# Patient Record
Sex: Male | Born: 1950 | Race: White | Hispanic: No | Marital: Married | Smoking: Never smoker
Health system: Southern US, Community
[De-identification: ages and names within clinical notes are randomized; demographics above are authoritative.]

## PROBLEM LIST (undated history)

## (undated) DIAGNOSIS — E039 Hypothyroidism, unspecified: Secondary | ICD-10-CM

## (undated) DIAGNOSIS — E119 Type 2 diabetes mellitus without complications: Secondary | ICD-10-CM

## (undated) DIAGNOSIS — I872 Venous insufficiency (chronic) (peripheral): Secondary | ICD-10-CM

## (undated) DIAGNOSIS — E7800 Pure hypercholesterolemia, unspecified: Secondary | ICD-10-CM

## (undated) DIAGNOSIS — M4800 Spinal stenosis, site unspecified: Secondary | ICD-10-CM

## (undated) DIAGNOSIS — Z974 Presence of external hearing-aid: Secondary | ICD-10-CM

## (undated) DIAGNOSIS — N183 Chronic kidney disease, stage 3 unspecified: Secondary | ICD-10-CM

## (undated) DIAGNOSIS — D649 Anemia, unspecified: Secondary | ICD-10-CM

## (undated) DIAGNOSIS — G7000 Myasthenia gravis without (acute) exacerbation: Secondary | ICD-10-CM

## (undated) DIAGNOSIS — E538 Deficiency of other specified B group vitamins: Secondary | ICD-10-CM

## (undated) DIAGNOSIS — R29898 Other symptoms and signs involving the musculoskeletal system: Secondary | ICD-10-CM

## (undated) DIAGNOSIS — G959 Disease of spinal cord, unspecified: Secondary | ICD-10-CM

## (undated) DIAGNOSIS — K219 Gastro-esophageal reflux disease without esophagitis: Secondary | ICD-10-CM

## (undated) DIAGNOSIS — N200 Calculus of kidney: Secondary | ICD-10-CM

## (undated) HISTORY — PX: COLONOSCOPY: SHX174

## (undated) HISTORY — PX: TONSILLECTOMY: SUR1361

## (undated) HISTORY — PX: CATARACT EXTRACTION W/ INTRAOCULAR LENS IMPLANT: SHX1309

## (undated) HISTORY — PX: SHOULDER SURGERY: SHX246

## (undated) HISTORY — PX: CARDIAC CATHETERIZATION: SHX172

---

## 2004-08-09 ENCOUNTER — Ambulatory Visit: Payer: Self-pay

## 2005-10-03 ENCOUNTER — Ambulatory Visit: Payer: Self-pay | Admitting: Unknown Physician Specialty

## 2006-04-24 ENCOUNTER — Ambulatory Visit: Payer: Self-pay | Admitting: Unknown Physician Specialty

## 2008-04-28 ENCOUNTER — Ambulatory Visit: Payer: Self-pay | Admitting: Neurology

## 2009-04-12 ENCOUNTER — Ambulatory Visit: Payer: Self-pay | Admitting: Unknown Physician Specialty

## 2010-01-02 ENCOUNTER — Ambulatory Visit: Payer: Self-pay | Admitting: Unknown Physician Specialty

## 2013-03-06 ENCOUNTER — Ambulatory Visit: Payer: Self-pay | Admitting: Physical Medicine and Rehabilitation

## 2013-09-17 ENCOUNTER — Encounter: Payer: Self-pay | Admitting: Neurology

## 2014-08-12 ENCOUNTER — Other Ambulatory Visit: Payer: Self-pay | Admitting: Physical Medicine and Rehabilitation

## 2014-08-12 DIAGNOSIS — M5416 Radiculopathy, lumbar region: Secondary | ICD-10-CM

## 2014-08-19 ENCOUNTER — Ambulatory Visit
Admission: RE | Admit: 2014-08-19 | Discharge: 2014-08-19 | Disposition: A | Discharge status: Home / Self Care | Payer: BLUE CROSS/BLUE SHIELD | Source: Ambulatory Visit | Attending: Physical Medicine and Rehabilitation | Admitting: Physical Medicine and Rehabilitation

## 2014-08-19 DIAGNOSIS — M5126 Other intervertebral disc displacement, lumbar region: Secondary | ICD-10-CM | POA: Diagnosis not present

## 2014-08-19 DIAGNOSIS — M5416 Radiculopathy, lumbar region: Secondary | ICD-10-CM

## 2014-08-19 DIAGNOSIS — M4806 Spinal stenosis, lumbar region: Secondary | ICD-10-CM | POA: Insufficient documentation

## 2014-08-19 DIAGNOSIS — M545 Low back pain: Secondary | ICD-10-CM | POA: Diagnosis present

## 2015-09-16 ENCOUNTER — Other Ambulatory Visit: Payer: Self-pay | Admitting: Infectious Diseases

## 2015-09-16 ENCOUNTER — Ambulatory Visit
Admission: RE | Admit: 2015-09-16 | Discharge: 2015-09-16 | Disposition: A | Discharge status: Home / Self Care | Payer: BLUE CROSS/BLUE SHIELD | Source: Ambulatory Visit | Attending: Infectious Diseases | Admitting: Infectious Diseases

## 2015-09-16 DIAGNOSIS — A439 Nocardiosis, unspecified: Secondary | ICD-10-CM

## 2015-09-16 DIAGNOSIS — G7000 Myasthenia gravis without (acute) exacerbation: Secondary | ICD-10-CM | POA: Insufficient documentation

## 2015-09-16 DIAGNOSIS — I672 Cerebral atherosclerosis: Secondary | ICD-10-CM | POA: Diagnosis not present

## 2015-09-24 HISTORY — PX: BACK SURGERY: SHX140

## 2015-12-14 ENCOUNTER — Encounter: Payer: Self-pay | Admitting: *Deleted

## 2015-12-19 NOTE — Discharge Instructions (Signed)

## 2015-12-21 ENCOUNTER — Ambulatory Visit: Payer: BLUE CROSS/BLUE SHIELD | Admitting: Anesthesiology

## 2015-12-21 ENCOUNTER — Ambulatory Visit
Admission: RE | Admit: 2015-12-21 | Discharge: 2015-12-21 | Disposition: A | Discharge status: Home / Self Care | Payer: BLUE CROSS/BLUE SHIELD | Source: Ambulatory Visit | Attending: Ophthalmology | Admitting: Ophthalmology

## 2015-12-21 ENCOUNTER — Encounter
Admission: RE | Disposition: A | Discharge status: Home / Self Care | Payer: Self-pay | Source: Ambulatory Visit | Attending: Ophthalmology

## 2015-12-21 DIAGNOSIS — Z9889 Other specified postprocedural states: Secondary | ICD-10-CM | POA: Diagnosis not present

## 2015-12-21 DIAGNOSIS — E1144 Type 2 diabetes mellitus with diabetic amyotrophy: Secondary | ICD-10-CM | POA: Insufficient documentation

## 2015-12-21 DIAGNOSIS — Z87442 Personal history of urinary calculi: Secondary | ICD-10-CM | POA: Insufficient documentation

## 2015-12-21 DIAGNOSIS — E7800 Pure hypercholesterolemia, unspecified: Secondary | ICD-10-CM | POA: Diagnosis not present

## 2015-12-21 DIAGNOSIS — D649 Anemia, unspecified: Secondary | ICD-10-CM | POA: Insufficient documentation

## 2015-12-21 DIAGNOSIS — Z880 Allergy status to penicillin: Secondary | ICD-10-CM | POA: Diagnosis not present

## 2015-12-21 DIAGNOSIS — Z7982 Long term (current) use of aspirin: Secondary | ICD-10-CM | POA: Diagnosis not present

## 2015-12-21 DIAGNOSIS — H2511 Age-related nuclear cataract, right eye: Secondary | ICD-10-CM | POA: Insufficient documentation

## 2015-12-21 DIAGNOSIS — Z794 Long term (current) use of insulin: Secondary | ICD-10-CM | POA: Insufficient documentation

## 2015-12-21 DIAGNOSIS — Z79899 Other long term (current) drug therapy: Secondary | ICD-10-CM | POA: Diagnosis not present

## 2015-12-21 DIAGNOSIS — H9193 Unspecified hearing loss, bilateral: Secondary | ICD-10-CM | POA: Diagnosis not present

## 2015-12-21 DIAGNOSIS — G7000 Myasthenia gravis without (acute) exacerbation: Secondary | ICD-10-CM | POA: Insufficient documentation

## 2015-12-21 HISTORY — DX: Calculus of kidney: N20.0

## 2015-12-21 HISTORY — DX: Anemia, unspecified: D64.9

## 2015-12-21 HISTORY — PX: CATARACT EXTRACTION W/PHACO: SHX586

## 2015-12-21 HISTORY — DX: Pure hypercholesterolemia, unspecified: E78.00

## 2015-12-21 HISTORY — DX: Hypothyroidism, unspecified: E03.9

## 2015-12-21 HISTORY — DX: Myasthenia gravis without (acute) exacerbation: G70.00

## 2015-12-21 HISTORY — DX: Other symptoms and signs involving the musculoskeletal system: R29.898

## 2015-12-21 HISTORY — DX: Presence of external hearing-aid: Z97.4

## 2015-12-21 HISTORY — DX: Type 2 diabetes mellitus without complications: E11.9

## 2015-12-21 LAB — GLUCOSE, CAPILLARY
GLUCOSE-CAPILLARY: 96 mg/dL (ref 65–99)
GLUCOSE-CAPILLARY: 93 mg/dL (ref 65–99)

## 2015-12-21 SURGERY — PHACOEMULSIFICATION, CATARACT, WITH IOL INSERTION
Anesthesia: Monitor Anesthesia Care | Laterality: Right | Wound class: Clean

## 2015-12-21 MED ORDER — MIDAZOLAM HCL 2 MG/2ML IJ SOLN
INTRAMUSCULAR | Status: DC | PRN
  Administered 2015-12-21: 2 mg via INTRAVENOUS

## 2015-12-21 MED ORDER — BSS IO SOLN
INTRAOCULAR | Status: DC | PRN
  Administered 2015-12-21: 54 mL via OPHTHALMIC

## 2015-12-21 MED ORDER — NA HYALUR & NA CHOND-NA HYALUR 0.4-0.35 ML IO KIT
PACK | INTRAOCULAR | Status: DC | PRN
  Administered 2015-12-21: 1 mL via INTRAOCULAR

## 2015-12-21 MED ORDER — MOXIFLOXACIN HCL 0.5 % OP SOLN
1.0000 [drp] | OPHTHALMIC | Status: DC | PRN
  Administered 2015-12-21 (×3): 1 [drp] via OPHTHALMIC

## 2015-12-21 MED ORDER — ARMC OPHTHALMIC DILATING DROPS
1.0000 "application " | OPHTHALMIC | Status: DC | PRN
  Administered 2015-12-21 (×3): 1 via OPHTHALMIC

## 2015-12-21 MED ORDER — OXYCODONE HCL 5 MG/5ML PO SOLN: 5.0000 mg | Freq: Once | ORAL | Status: DC | PRN

## 2015-12-21 MED ORDER — LACTATED RINGERS IV SOLN: INTRAVENOUS | Status: DC

## 2015-12-21 MED ORDER — LIDOCAINE HCL (PF) 4 % IJ SOLN
INTRAOCULAR | Status: DC | PRN
  Administered 2015-12-21: 1 mL via OPHTHALMIC

## 2015-12-21 MED ORDER — CEFUROXIME OPHTHALMIC INJECTION 1 MG/0.1 ML
INJECTION | OPHTHALMIC | Status: DC | PRN
  Administered 2015-12-21: 0.1 mL via OPHTHALMIC

## 2015-12-21 MED ORDER — TIMOLOL MALEATE 0.5 % OP SOLN
OPHTHALMIC | Status: DC | PRN
Start: 2015-12-21 — End: 2015-12-21
  Administered 2015-12-21: 1 [drp] via OPHTHALMIC

## 2015-12-21 MED ORDER — ARMC OPHTHALMIC DILATING DROPS: 1.0000 "application " | OPHTHALMIC | Status: DC | PRN

## 2015-12-21 MED ORDER — BRIMONIDINE TARTRATE 0.2 % OP SOLN
OPHTHALMIC | Status: DC | PRN
  Administered 2015-12-21: 1 [drp] via OPHTHALMIC

## 2015-12-21 MED ORDER — OXYCODONE HCL 5 MG PO TABS: 5.0000 mg | ORAL_TABLET | Freq: Once | ORAL | Status: DC | PRN

## 2015-12-21 SURGICAL SUPPLY — 25 items
CANNULA ANT/CHMB 27GA (MISCELLANEOUS) ×2 IMPLANT
CARTRIDGE ABBOTT (MISCELLANEOUS) IMPLANT
GLOVE SURG LX 7.5 STRW (GLOVE) ×1
GLOVE SURG LX STRL 7.5 STRW (GLOVE) ×1 IMPLANT
GLOVE SURG TRIUMPH 8.0 PF LTX (GLOVE) ×2 IMPLANT
GOWN STRL REUS W/ TWL LRG LVL3 (GOWN DISPOSABLE) ×2 IMPLANT
GOWN STRL REUS W/TWL LRG LVL3 (GOWN DISPOSABLE) ×2
LENS IOL ACRYSOF IQ 20.5 (Intraocular Lens) ×2 IMPLANT
MARKER SKIN DUAL TIP RULER LAB (MISCELLANEOUS) ×2 IMPLANT
NDL RETROBULBAR .5 NSTRL (NEEDLE) IMPLANT
NEEDLE FILTER BLUNT 18X 1/2SAF (NEEDLE) ×1
NEEDLE FILTER BLUNT 18X1 1/2 (NEEDLE) ×1 IMPLANT
PACK CATARACT BRASINGTON (MISCELLANEOUS) ×2 IMPLANT
PACK EYE AFTER SURG (MISCELLANEOUS) ×2 IMPLANT
PACK OPTHALMIC (MISCELLANEOUS) ×2 IMPLANT
RING MALYGIN 7.0 (MISCELLANEOUS) IMPLANT
SUT ETHILON 10-0 CS-B-6CS-B-6 (SUTURE)
SUT VICRYL  9 0 (SUTURE)
SUT VICRYL 9 0 (SUTURE) IMPLANT
SUTURE EHLN 10-0 CS-B-6CS-B-6 (SUTURE) IMPLANT
SYR 3ML LL SCALE MARK (SYRINGE) ×2 IMPLANT
SYR 5ML LL (SYRINGE) ×2 IMPLANT
SYR TB 1ML LUER SLIP (SYRINGE) ×2 IMPLANT
WATER STERILE IRR 250ML POUR (IV SOLUTION) ×2 IMPLANT
WIPE NON LINTING 3.25X3.25 (MISCELLANEOUS) ×2 IMPLANT

## 2015-12-21 NOTE — H&P (Signed)
The History and Physical notes are on paper, have been signed, and are to be scanned. The patient remains stable and unchanged from the H&P.   Previous H&P reviewed, patient examined, and there are no changes.  [name] 12/21/2015 7:36 AM   

## 2015-12-21 NOTE — Transfer of Care (Signed)
Immediate Anesthesia Transfer of Care Note  Patient: Douglas KnackRichard L Sevigny Jr.  Procedure(s) Performed: Procedure(s) with comments: CATARACT EXTRACTION PHACO AND INTRAOCULAR LENS PLACEMENT (IOC) (Right) - DIABETIC - insulin and oral meds  Patient Location: PACU  Anesthesia Type: MAC  Level of Consciousness: awake, alert  and patient cooperative  Airway and Oxygen Therapy: Patient Spontanous Breathing and Patient connected to supplemental oxygen  Post-op Assessment: Post-op Vital signs reviewed, Patient's Cardiovascular Status Stable, Respiratory Function Stable, Patent Airway and No signs of Nausea or vomiting  Post-op Vital Signs: Reviewed and stable  Complications: No apparent anesthesia complications

## 2015-12-21 NOTE — Anesthesia Procedure Notes (Signed)
Procedure Name: MAC Performed by: Dyani Babel Pre-anesthesia Checklist: Patient identified, Emergency Drugs available, Suction available, Timeout performed and Patient being monitored Patient Re-evaluated:Patient Re-evaluated prior to inductionOxygen Delivery Method: Nasal cannula Placement Confirmation: positive ETCO2     

## 2015-12-21 NOTE — Anesthesia Preprocedure Evaluation (Signed)
Anesthesia Evaluation  Patient identified by MRN, date of birth, ID band Patient awake    Reviewed: Allergy & Precautions, H&P , NPO status , Patient's Chart, lab work & pertinent test results  History of Anesthesia Complications Negative for: history of anesthetic complications  Airway Mallampati: I  TM Distance: >3 FB Neck ROM: full    Dental no notable dental hx.    Pulmonary neg pulmonary ROS,    Pulmonary exam normal        Cardiovascular negative cardio ROS Normal cardiovascular exam     Neuro/Psych Myasthenia- currently stable.  Neuromuscular disease negative psych ROS   GI/Hepatic negative GI ROS, Neg liver ROS,   Endo/Other  diabetes, Well Controlled, Type 2Hypothyroidism   Renal/GU negative Renal ROS     Musculoskeletal   Abdominal   Peds  Hematology   Anesthesia Other Findings   Reproductive/Obstetrics                             Anesthesia Physical Anesthesia Plan  ASA: II  Anesthesia Plan: MAC   Post-op Pain Management:    Induction:   Airway Management Planned:   Additional Equipment:   Intra-op Plan:   Post-operative Plan:   Informed Consent:   Plan Discussed with:   Anesthesia Plan Comments:         Anesthesia Quick Evaluation

## 2015-12-21 NOTE — Op Note (Signed)
LOCATION:  [facility]   PREOPERATIVE DIAGNOSIS:    Nuclear sclerotic cataract right eye. H25.11   POSTOPERATIVE DIAGNOSIS:  Nuclear sclerotic cataract right eye.     PROCEDURE:  Phacoemusification with posterior chamber intraocular lens placement of the right eye   LENS:   Implant Name Type Inv. Item Serial No. Manufacturer Lot No. LRB No. Used  LENS IOL ACRYSOF IQ 20.5 - [id number] Intraocular Lens LENS IOL ACRYSOF IQ 20.5 [id number] ALCON   Right 1        ULTRASOUND TIME: 15 % of 0 minutes, 58 seconds.  CDE 8.8   SURGEON:  Deirdre Evenerhadwick R. Bensen [place]adderdon, MD   ANESTHESIA:  Topical with tetracaine drops and 2% Xylocaine jelly, augmented with 1% preservative-free intracameral lidocaine.    COMPLICATIONS:  None.   DESCRIPTION OF PROCEDURE:  The patient was identified in the holding room and transported to the operating room and placed in the supine position under the operating microscope.  The right eye was identified as the operative eye and it was prepped and draped in the usual sterile ophthalmic fashion.   A 1 millimeter clear-corneal paracentesis was made at the 12:00 position.  0.5 ml of preservative-free 1% lidocaine was injected into the anterior chamber. The anterior chamber was filled with Viscoat viscoelastic.  A 2.4 millimeter keratome was used to make a near-clear corneal incision at the 9:00 position.  A curvilinear capsulorrhexis was made with a cystotome and capsulorrhexis forceps.  Balanced salt solution was used to hydrodissect and hydrodelineate the nucleus.   Phacoemulsification was then used in stop and chop fashion to remove the lens nucleus and epinucleus.  The remaining cortex was then removed using the irrigation and aspiration handpiece. Provisc was then placed into the capsular bag to distend it for lens placement.  A lens was then injected into the capsular bag.  The remaining viscoelastic was aspirated.   Wounds were hydrated with balanced salt solution.   The anterior chamber was inflated to a physiologic pressure with balanced salt solution.  No wound leaks were noted. Cefuroxime 0.1 ml of a 10mg /ml solution was injected into the anterior chamber for a dose of 1 mg of intracameral antibiotic at the completion of the case.   Timolol and Brimonidine drops were applied to the eye.  The patient was taken to the recovery room in stable condition without complications of anesthesia or surgery.   Meredith Kilbride 12/21/2015, 8:00 AM

## 2015-12-21 NOTE — Anesthesia Postprocedure Evaluation (Signed)
Anesthesia Post Note  Patient: Douglas KnackRichard L Helmers Jr.  Procedure(s) Performed: Procedure(s) (LRB): CATARACT EXTRACTION PHACO AND INTRAOCULAR LENS PLACEMENT (IOC) (Right)  Patient location during evaluation: PACU Anesthesia Type: MAC Level of consciousness: awake and alert Pain management: pain level controlled Vital Signs Assessment: post-procedure vital signs reviewed and stable Respiratory status: spontaneous breathing Cardiovascular status: stable Postop Assessment: no headache Anesthetic complications: no    Verner Cholunkle, III,  Quy D

## 2015-12-22 ENCOUNTER — Encounter: Payer: Self-pay | Admitting: Ophthalmology

## 2016-01-02 ENCOUNTER — Emergency Department
Admission: EM | Admit: 2016-01-02 | Discharge: 2016-01-02 | Disposition: A | Discharge status: Home / Self Care | Payer: BLUE CROSS/BLUE SHIELD | Attending: Emergency Medicine | Admitting: Emergency Medicine

## 2016-01-02 ENCOUNTER — Encounter: Payer: Self-pay | Admitting: Emergency Medicine

## 2016-01-02 DIAGNOSIS — Z7982 Long term (current) use of aspirin: Secondary | ICD-10-CM | POA: Insufficient documentation

## 2016-01-02 DIAGNOSIS — Z79899 Other long term (current) drug therapy: Secondary | ICD-10-CM | POA: Diagnosis not present

## 2016-01-02 DIAGNOSIS — R350 Frequency of micturition: Secondary | ICD-10-CM | POA: Diagnosis present

## 2016-01-02 DIAGNOSIS — Z791 Long term (current) use of non-steroidal anti-inflammatories (NSAID): Secondary | ICD-10-CM | POA: Diagnosis not present

## 2016-01-02 DIAGNOSIS — E119 Type 2 diabetes mellitus without complications: Secondary | ICD-10-CM | POA: Diagnosis not present

## 2016-01-02 DIAGNOSIS — E039 Hypothyroidism, unspecified: Secondary | ICD-10-CM | POA: Insufficient documentation

## 2016-01-02 DIAGNOSIS — Z794 Long term (current) use of insulin: Secondary | ICD-10-CM | POA: Insufficient documentation

## 2016-01-02 DIAGNOSIS — E875 Hyperkalemia: Secondary | ICD-10-CM | POA: Diagnosis not present

## 2016-01-02 LAB — URINALYSIS COMPLETE WITH MICROSCOPIC (ARMC ONLY)
BACTERIA UA: NONE SEEN
Bilirubin Urine: NEGATIVE
Glucose, UA: >500 mg/dL — AB
Ketones, ur: NEGATIVE mg/dL
Leukocytes, UA: NEGATIVE
NITRITE: NEGATIVE
PROTEIN: 30 mg/dL — AB
SPECIFIC GRAVITY, URINE: 1.017 (ref 1.005–1.030)
pH: 5 (ref 5.0–8.0)

## 2016-01-02 LAB — CBC
HCT: 37.7 % — ABNORMAL LOW (ref 40.0–52.0)
Hemoglobin: 12.6 g/dL — ABNORMAL LOW (ref 13.0–18.0)
MCH: 30.2 pg (ref 26.0–34.0)
MCHC: 33.4 g/dL (ref 32.0–36.0)
MCV: 90.4 fL (ref 80.0–100.0)
Platelets: 287 10*3/uL (ref 150–440)
RBC: 4.17 MIL/uL — ABNORMAL LOW (ref 4.40–5.90)
RDW: 14.3 % (ref 11.5–14.5)
WBC: 8.4 10*3/uL (ref 3.8–10.6)

## 2016-01-02 LAB — BASIC METABOLIC PANEL
Anion gap: 8 (ref 5–15)
BUN: 34 mg/dL — AB (ref 6–20)
CALCIUM: 9.1 mg/dL (ref 8.9–10.3)
CO2: 18 mmol/L — ABNORMAL LOW (ref 22–32)
CREATININE: 1.46 mg/dL — AB (ref 0.61–1.24)
Chloride: 108 mmol/L (ref 101–111)
GFR calc Af Amer: 58 mL/min — ABNORMAL LOW (ref >60)
GFR, EST NON AFRICAN AMERICAN: 50 mL/min — AB (ref >60)
GLUCOSE: 298 mg/dL — AB (ref 65–99)
POTASSIUM: 5.5 mmol/L — AB (ref 3.5–5.1)
SODIUM: 134 mmol/L — AB (ref 135–145)

## 2016-01-02 LAB — GLUCOSE, CAPILLARY: GLUCOSE-CAPILLARY: 316 mg/dL — AB (ref 65–99)

## 2016-01-02 NOTE — ED Triage Notes (Signed)
[facility] with elevated potassium. Pt states he thinks he may be dehydrated. Pt denies NVD. Pt reports increased urination. Pt recently treated for UTI.

## 2016-01-02 NOTE — Discharge Instructions (Signed)
Please seek medical attention for any high fevers, chest pain, shortness of breath, change in behavior, persistent vomiting, bloody stool or any other new or concerning symptoms.  

## 2016-01-02 NOTE — ED Provider Notes (Signed)
[facility] Emergency Department Provider Note    ____________________________________________   I have reviewed the triage vital signs and the nursing notes.   HISTORY  Chief Complaint Abnormal Lab   History limited by: Not Limited   HPI [name]. is a 65 y.o. male who presents to the emergency department today at the request of his primary provider for IV fluids. The patient has been seeing his PCP because of concern for urinary frequency. He was diagnosed with UTI and has been on antibiotics. Today at recheck blood work was checked which showed elevated potassium and creatinine. Given these findings the patient was advised to come tot he emergency department for IV fluids.   Past Medical History:  Diagnosis Date  . Anemia   . Diabetes mellitus without complication ([place])   . Hypercholesteremia   . Hypothyroidism   . Kidney stones   . Leg weakness, bilateral    due to Myasthenia Gravis  . Myasthenia gravis ([place])   . Wears hearing aid    bilateral    There are no active problems to display for this patient.   Past Surgical History:  Procedure Laterality Date  . BACK SURGERY  09/2015  . CARDIAC CATHETERIZATION    . CATARACT EXTRACTION W/ INTRAOCULAR LENS IMPLANT    . CATARACT EXTRACTION W/PHACO Right 12/21/2015   Procedure: CATARACT EXTRACTION PHACO AND INTRAOCULAR LENS PLACEMENT (IOC);  Surgeon: Leandrew Koyanagi, MD;  Location: [place];  Service: Ophthalmology;  Laterality: Right;  DIABETIC - insulin and oral meds  . COLONOSCOPY    . SHOULDER SURGERY     labrum repair  . TONSILLECTOMY      Prior to Admission medications   Medication Sig Start Date End Date Taking? Authorizing Provider  aspirin 81 MG tablet Take 81 mg by mouth daily.    Historical Provider, MD  benzonatate (TESSALON) 200 MG capsule Take 200 mg by mouth 3 (three) times daily as needed for cough.    Historical Provider, MD  Cyanocobalamin 1000  MCG/ML KIT Inject as directed.    Historical Provider, MD  etodolac (LODINE) 500 MG tablet Take 500 mg by mouth daily as needed.    Historical Provider, MD  insulin glargine (LANTUS) 100 UNIT/ML injection Inject 54 Units into the skin daily.    Historical Provider, MD  insulin lispro (HUMALOG) 100 UNIT/ML injection Inject into the skin as needed for high blood sugar (sliding scale).    Historical Provider, MD  levalbuterol Penne Lash) 1.25 MG/3ML nebulizer solution Take 1.25 mg by nebulization every 4 (four) hours as needed for wheezing.    Historical Provider, MD  levothyroxine (SYNTHROID, LEVOTHROID) 150 MCG tablet Take 150 mcg by mouth daily before breakfast.    Historical Provider, MD  metFORMIN (GLUCOPHAGE) 1000 MG tablet Take 1,000 mg by mouth 2 (two) times daily with a meal.    Historical Provider, MD  minocycline (DYNACIN) 100 MG tablet Take 100 mg by mouth 2 (two) times daily.    Historical Provider, MD  mycophenolate (CELLCEPT) 250 MG capsule Take by mouth 2 (two) times daily. 1500 mg AM, 1250 mg PM    Historical Provider, MD  pantoprazole (PROTONIX) 40 MG tablet Take 40 mg by mouth 2 (two) times daily.    Historical Provider, MD  pioglitazone (ACTOS) 45 MG tablet Take 45 mg by mouth daily.    Historical Provider, MD  pravastatin (PRAVACHOL) 80 MG tablet Take 80 mg by mouth daily.    Historical Provider, MD  predniSONE (DELTASONE) 10 MG tablet Take 30 mg by mouth daily with breakfast.    Historical Provider, MD  pyridostigmine (MESTINON) 60 MG tablet Take 60 mg by mouth 3 (three) times daily.    Historical Provider, MD    Allergies Omnicef [cefdinir] and Penicillins  No family history on file.  Social History Social History  Substance Use Topics  . Smoking status: Never Smoker  . Smokeless tobacco: Never Used  . Alcohol use No    Review of Systems  Constitutional: Negative for fever. Cardiovascular: Negative for chest pain. Respiratory: Negative for shortness of  breath. Gastrointestinal: Negative for abdominal pain, vomiting and diarrhea. Genitourinary: Positive for urinary frequency. Neurological: Negative for headaches, focal weakness or numbness.  10-point ROS otherwise negative.  ____________________________________________   PHYSICAL EXAM:  VITAL SIGNS: ED Triage Vitals  Enc Vitals Group     BP 01/02/16 1548 139/80     Pulse Rate 01/02/16 1548 76     Resp 01/02/16 1548 18     Temp 01/02/16 1548 98.3 F (36.8 C)     Temp Source 01/02/16 1548 Oral     SpO2 01/02/16 1548 99 %     Weight 01/02/16 1547 (!) 310 lb (140.6 kg)     Height 01/02/16 1547 6' (1.829 m)     Head Circumference --      Peak Flow --      Pain Score 01/02/16 1547 0   Constitutional: Alert and oriented. Well appearing and in no distress. Eyes: Conjunctivae are normal. Normal extraocular movements. ENT   Head: Normocephalic and atraumatic.   Nose: No congestion/rhinnorhea.   Mouth/Throat: Mucous membranes are moist.   Neck: No stridor. Hematological/Lymphatic/Immunilogical: No cervical lymphadenopathy. Cardiovascular: Normal rate, regular rhythm.  No murmurs, rubs, or gallops. Respiratory: Normal respiratory effort without tachypnea nor retractions. Breath sounds are clear and equal bilaterally. No wheezes/rales/rhonchi. Gastrointestinal: Soft and nontender. No distention.  Genitourinary: Deferred Musculoskeletal: Normal range of motion in all extremities. No lower extremity edema. Neurologic:  Normal speech and language. No gross focal neurologic deficits are appreciated.  Skin:  Skin is warm, dry and intact. No rash noted. Psychiatric: Mood and affect are normal. Speech and behavior are normal. Patient exhibits appropriate insight and judgment.  ____________________________________________    LABS (pertinent positives/negatives) Labs Reviewed  BASIC METABOLIC PANEL - Abnormal; Notable for the following:       Result Value   Sodium 134 (*)     Potassium 5.5 (*)    CO2 18 (*)    Glucose, Bld 298 (*)    BUN 34 (*)    Creatinine, Ser 1.46 (*)    GFR calc non Af Amer 50 (*)    GFR calc Af Amer 58 (*)    All other components within normal limits  CBC - Abnormal; Notable for the following:    RBC 4.17 (*)    Hemoglobin 12.6 (*)    HCT 37.7 (*)    All other components within normal limits  URINALYSIS COMPLETEWITH MICROSCOPIC ([facility] ONLY) - Abnormal; Notable for the following:    Color, Urine YELLOW (*)    APPearance CLEAR (*)    Glucose, UA >500 (*)    Hgb urine dipstick 2+ (*)    Protein, ur 30 (*)    Squamous Epithelial / LPF 0-5 (*)    All other components within normal limits  GLUCOSE, CAPILLARY - Abnormal; Notable for the following:    Glucose-Capillary 316 (*)    All other components within  normal limits  CBG MONITORING, ED     ____________________________________________   EKG  I, Nance Pear, attending physician, personally viewed and interpreted this EKG  EKG Time: 1554 Rate: 76 Rhythm: normal sinus rhythm Axis: normal Intervals: qtc 396 QRS: narrow ST changes: no st elevation Impression: normal ekg   ____________________________________________    RADIOLOGY  None   ____________________________________________   PROCEDURES  Procedures  ____________________________________________   INITIAL IMPRESSION / ASSESSMENT AND PLAN / ED COURSE  Pertinent labs & imaging results that were available during my care of the patient were reviewed by me and considered in my medical decision making (see chart for details).  Potassium has started to improve over what was gotten a clinic today. Will give patient IV fluids here to help with likely small amount of dehydration. Will give patient information on potassium content of foods. Patient states he will follow up in a few days for repeat labs. ____________________________________________   FINAL CLINICAL IMPRESSION(S) / ED DIAGNOSES  Final  diagnoses:  Hyperkalemia     Note: This dictation was prepared with Dragon dictation. Any transcriptional errors that result from this process are unintentional    Nance Pear, MD 01/02/16 2004

## 2016-01-06 ENCOUNTER — Other Ambulatory Visit: Payer: Self-pay | Admitting: Internal Medicine

## 2016-01-06 ENCOUNTER — Ambulatory Visit
Admission: RE | Admit: 2016-01-06 | Discharge: 2016-01-06 | Disposition: A | Discharge status: Home / Self Care | Payer: BLUE CROSS/BLUE SHIELD | Source: Ambulatory Visit | Attending: Internal Medicine | Admitting: Internal Medicine

## 2016-01-06 DIAGNOSIS — R319 Hematuria, unspecified: Secondary | ICD-10-CM

## 2016-01-06 DIAGNOSIS — N3091 Cystitis, unspecified with hematuria: Secondary | ICD-10-CM | POA: Insufficient documentation

## 2016-01-06 DIAGNOSIS — N200 Calculus of kidney: Secondary | ICD-10-CM | POA: Diagnosis not present

## 2016-01-06 DIAGNOSIS — M4856XA Collapsed vertebra, not elsewhere classified, lumbar region, initial encounter for fracture: Secondary | ICD-10-CM | POA: Insufficient documentation

## 2016-01-06 DIAGNOSIS — N219 Calculus of lower urinary tract, unspecified: Secondary | ICD-10-CM | POA: Diagnosis present

## 2016-07-09 ENCOUNTER — Ambulatory Visit (INDEPENDENT_AMBULATORY_CARE_PROVIDER_SITE_OTHER): Payer: BLUE CROSS/BLUE SHIELD | Admitting: Vascular Surgery

## 2016-07-09 ENCOUNTER — Other Ambulatory Visit (INDEPENDENT_AMBULATORY_CARE_PROVIDER_SITE_OTHER): Payer: BLUE CROSS/BLUE SHIELD

## 2016-07-09 ENCOUNTER — Encounter (INDEPENDENT_AMBULATORY_CARE_PROVIDER_SITE_OTHER): Payer: Self-pay | Admitting: Vascular Surgery

## 2016-07-09 ENCOUNTER — Other Ambulatory Visit (INDEPENDENT_AMBULATORY_CARE_PROVIDER_SITE_OTHER): Payer: Self-pay | Admitting: Internal Medicine

## 2016-07-09 DIAGNOSIS — I739 Peripheral vascular disease, unspecified: Secondary | ICD-10-CM

## 2016-07-09 DIAGNOSIS — M79605 Pain in left leg: Secondary | ICD-10-CM | POA: Diagnosis not present

## 2016-07-09 DIAGNOSIS — E118 Type 2 diabetes mellitus with unspecified complications: Secondary | ICD-10-CM

## 2016-07-09 DIAGNOSIS — I872 Venous insufficiency (chronic) (peripheral): Secondary | ICD-10-CM | POA: Diagnosis not present

## 2016-07-09 DIAGNOSIS — M79604 Pain in right leg: Secondary | ICD-10-CM

## 2016-07-09 DIAGNOSIS — M7989 Other specified soft tissue disorders: Secondary | ICD-10-CM

## 2016-07-09 DIAGNOSIS — E782 Mixed hyperlipidemia: Secondary | ICD-10-CM | POA: Diagnosis not present

## 2016-07-15 DIAGNOSIS — M79606 Pain in leg, unspecified: Secondary | ICD-10-CM | POA: Insufficient documentation

## 2016-07-15 DIAGNOSIS — E119 Type 2 diabetes mellitus without complications: Secondary | ICD-10-CM | POA: Insufficient documentation

## 2016-07-15 DIAGNOSIS — E785 Hyperlipidemia, unspecified: Secondary | ICD-10-CM | POA: Insufficient documentation

## 2016-07-15 DIAGNOSIS — I872 Venous insufficiency (chronic) (peripheral): Secondary | ICD-10-CM | POA: Insufficient documentation

## 2016-07-15 DIAGNOSIS — M7989 Other specified soft tissue disorders: Secondary | ICD-10-CM | POA: Insufficient documentation

## 2016-07-15 NOTE — Progress Notes (Signed)
MRN : [id number]  [name]. is a 66 y.o. (21-Feb-1951) male who presents with chief complaint of  Chief Complaint  Patient presents with  . New Patient (Initial Visit)  .  History of Present Illness: Patient is seen for evaluation of leg pain and leg swelling. The patient first noticed the swelling remotely. The swelling is associated with pain and discoloration. The pain and swelling worsens with prolonged dependency and improves with elevation. The pain is unrelated to activity.  The patient notes that in the morning the legs are significantly improved but they steadily worsened throughout the course of the day. The patient also notes a steady worsening of the discoloration in the ankle and shin area.   The patient denies claudication symptoms.  The patient denies symptoms consistent with rest pain.  The patient denies and extensive history of DJD and LS spine disease.  The patient has no had any past angiography, interventions or vascular surgery.  Elevation makes the leg symptoms better, dependency makes them much worse. There is no history of ulcerations. The patient denies any recent changes in medications.  The patient has not been wearing graduated compression.  The patient denies a history of DVT or PE. There is no prior history of phlebitis. There is no history of primary lymphedema.  No history of malignancies. No history of trauma or groin or pelvic surgery. There is no history of radiation treatment to the groin or pelvis  The patient denies amaurosis fugax or recent TIA symptoms. There are no recent neurological changes noted. The patient denies recent episodes of angina or shortness of breath Current Meds  Medication Sig  . aspirin 81 MG tablet Take 81 mg by mouth daily.  . Cyanocobalamin 1000 MCG/ML KIT Inject as directed.  . etodolac (LODINE) 500 MG tablet Take 500 mg by mouth daily as needed.  . insulin glargine (LANTUS) 100 UNIT/ML injection Inject 54  Units into the skin daily.  . insulin lispro (HUMALOG) 100 UNIT/ML injection Inject into the skin as needed for high blood sugar (sliding scale).  Marland Kitchen levothyroxine (SYNTHROID, LEVOTHROID) 150 MCG tablet Take 150 mcg by mouth daily before breakfast.  . metFORMIN (GLUCOPHAGE) 1000 MG tablet Take 1,000 mg by mouth 2 (two) times daily with a meal.  . mycophenolate (CELLCEPT) 250 MG capsule Take by mouth 2 (two) times daily. 1500 mg AM, 1250 mg PM  . pantoprazole (PROTONIX) 40 MG tablet Take 40 mg by mouth 2 (two) times daily.  . pioglitazone (ACTOS) 45 MG tablet Take 45 mg by mouth daily.  . pravastatin (PRAVACHOL) 80 MG tablet Take 80 mg by mouth daily.  . predniSONE (DELTASONE) 10 MG tablet Take 30 mg by mouth daily with breakfast.    Past Medical History:  Diagnosis Date  . Anemia   . Diabetes mellitus without complication ([place])   . Hypercholesteremia   . Hypothyroidism   . Kidney stones   . Leg weakness, bilateral    due to Myasthenia Gravis  . Myasthenia gravis ([place])   . Wears hearing aid    bilateral    Past Surgical History:  Procedure Laterality Date  . BACK SURGERY  09/2015  . CARDIAC CATHETERIZATION    . CATARACT EXTRACTION W/ INTRAOCULAR LENS IMPLANT    . CATARACT EXTRACTION W/PHACO Right 12/21/2015   Procedure: CATARACT EXTRACTION PHACO AND INTRAOCULAR LENS PLACEMENT (IOC);  Surgeon: Leandrew Koyanagi, MD;  Location: [facility];  Service: Ophthalmology;  Laterality: Right;  DIABETIC - insulin  and oral meds  . COLONOSCOPY    . SHOULDER SURGERY     labrum repair  . TONSILLECTOMY      Social History Social History  Substance Use Topics  . Smoking status: Never Smoker  . Smokeless tobacco: Never Used  . Alcohol use No    Family History Family History  Problem Relation Age of Onset  . Clotting disorder Father   . Heart attack Father   No family history of bleeding/clotting disorders, porphyria or autoimmune disease   Allergies  Allergen Reactions  .  Omnicef [Cefdinir]     C. Diff  . Penicillins Rash    Large doses     REVIEW OF SYSTEMS (Negative unless checked)  Constitutional: '[]' Weight loss  '[]' Fever  '[]' Chills Cardiac: '[]' Chest pain   '[]' Chest pressure   '[]' Palpitations   '[]' Shortness of breath when laying flat   '[]' Shortness of breath with exertion. Vascular:  '[]' Pain in legs with walking   '[x]' Pain in legs with standing  '[]' History of DVT   '[]' Phlebitis   '[x]' Swelling in legs   '[x]' Varicose veins   '[]' Non-healing ulcers Pulmonary:   '[]' Uses home oxygen   '[]' Productive cough   '[]' Hemoptysis   '[]' Wheeze  '[]' COPD   '[]' Asthma Neurologic:  '[]' Dizziness   '[]' Seizures   '[]' History of stroke   '[]' History of TIA  '[]' Aphasia   '[]' Vissual changes   '[]' Weakness or numbness in arm   '[]' Weakness or numbness in leg Musculoskeletal:   '[]' Joint swelling   '[]' Joint pain   '[]' Low back pain Hematologic:  '[]' Easy bruising  '[]' Easy bleeding   '[]' Hypercoagulable state   '[]' Anemic Gastrointestinal:  '[]' Diarrhea   '[]' Vomiting  '[]' Gastroesophageal reflux/heartburn   '[]' Difficulty swallowing. Genitourinary:  '[]' Chronic kidney disease   '[]' Difficult urination  '[]' Frequent urination   '[]' Blood in urine Skin:  '[]' Rashes   '[]' Ulcers  Psychological:  '[]' History of anxiety   '[]'  History of major depression.  Physical Examination  Vitals:   07/09/16 1149  BP: (!) 176/79  Pulse: 66  Resp: 17  Weight: (!) 149.3 kg (329 lb 3.2 oz)  Height: 6' (1.829 m)   Body mass index is 44.65 kg/m. Gen: WD/WN, NAD Head: [place]/AT, No temporalis wasting.  Ear/Nose/Throat: Hearing grossly intact, nares w/o erythema or drainage, poor dentition Eyes: PER, EOMI, sclera nonicteric.  Neck: Supple, no masses.  No bruit or JVD.  Pulmonary:  Good air movement, clear to auscultation bilaterally, no use of accessory muscles.  Cardiac: RRR, normal S1, S2, no Murmurs. Vascular: 2-3+ edema bilaterally with severe venous changes bilaterally.   Vessel Right Left  Radial Palpable Palpable  Ulnar Palpable Palpable  Brachial Palpable  Palpable  Carotid Palpable Palpable  Femoral Palpable Palpable  Popliteal Trace Palpable Trace Palpable  PT Trace Palpable Trace Palpable  DP Trace Palpable Trace Palpable   Gastrointestinal: soft, non-distended. No guarding/no peritoneal signs.  Musculoskeletal: M/S 5/5 throughout.  No deformity or atrophy.  Neurologic: CN 2-12 intact. Pain and light touch intact in extremities.  Symmetrical.  Speech is fluent. Motor exam as listed above. Psychiatric: Judgment intact, Mood & affect appropriate for pt's clinical situation. Dermatologic: Venous stasis dermatitis with ulcers present.  No changes consistent with cellulitis. Lymph : No Cervical lymphadenopathy, no lichenification or skin changes of chronic lymphedema.  CBC Lab Results  Component Value Date   WBC 8.4 01/02/2016   HGB 12.6 (L) 01/02/2016   HCT 37.7 (L) 01/02/2016   MCV 90.4 01/02/2016   PLT 287 01/02/2016    BMET    Component Value Date/Time  NA 134 (L) 01/02/2016 1551   K 5.5 (H) 01/02/2016 1551   CL 108 01/02/2016 1551   CO2 18 (L) 01/02/2016 1551   GLUCOSE 298 (H) 01/02/2016 1551   BUN 34 (H) 01/02/2016 1551   CREATININE 1.46 (H) 01/02/2016 1551   CALCIUM 9.1 01/02/2016 1551   GFRNONAA 50 (L) 01/02/2016 1551   GFRAA 58 (L) 01/02/2016 1551   CrCl cannot be calculated (Patient's most recent lab result is older than the maximum 21 days allowed.).  COAG No results found for: INR, PROTIME  Radiology No results found.  Assessment/Plan 1. Pain in both lower extremities No surgery or intervention at this point in time.    I have had a long discussion with the patient regarding venous insufficiency and why it  causes symptoms. I have discussed with the patient the chronic skin changes that accompany venous insufficiency and the long term sequela such as infection and ulceration.  Patient will begin wearing graduated compression stockings class 1 (20-30 mmHg) or compression wraps on a daily basis a prescription  was given. The patient will put the stockings on first thing in the morning and removing them in the evening. The patient is instructed specifically not to sleep in the stockings.    In addition, behavioral modification including several periods of elevation of the lower extremities during the day will be continued. I have demonstrated that proper elevation is a position with the ankles at heart level.  The patient is instructed to begin routine exercise, especially walking on a daily basis  Following the review of the ultrasound the patient will follow up in 2-3 months to reassess the degree of swelling and the control that graduated compression stockings or compression wraps  is offering.   The patient can be assessed for a Lymph Pump at that time  2. Chronic venous insufficiency No surgery or intervention at this point in time.    I have had a long discussion with the patient regarding venous insufficiency and why it  causes symptoms. I have discussed with the patient the chronic skin changes that accompany venous insufficiency and the long term sequela such as infection and ulceration.  Patient will begin wearing graduated compression stockings class 1 (20-30 mmHg) or compression wraps on a daily basis a prescription was given. The patient will put the stockings on first thing in the morning and removing them in the evening. The patient is instructed specifically not to sleep in the stockings.    In addition, behavioral modification including several periods of elevation of the lower extremities during the day will be continued. I have demonstrated that proper elevation is a position with the ankles at heart level.  The patient is instructed to begin routine exercise, especially walking on a daily basis  Following the review of the ultrasound the patient will follow up in 2-3 months to reassess the degree of swelling and the control that graduated compression stockings or compression wraps  is  offering.   The patient can be assessed for a Lymph Pump at that time  3. Swelling of limb See #1&2  4. Type 2 diabetes mellitus with complication, without long-term current use of insulin (HCC) Continue hypoglycemic medications as already ordered, these medications have been reviewed and there are no changes at this time.  Hgb A1C to be monitored as already arranged by primary service   5. Mixed hyperlipidemia Continue statin as ordered and reviewed, no changes at this time   Hortencia Pilar, MD  07/15/2016 11:50 AM

## 2016-10-15 ENCOUNTER — Ambulatory Visit (INDEPENDENT_AMBULATORY_CARE_PROVIDER_SITE_OTHER): Payer: BLUE CROSS/BLUE SHIELD | Admitting: Vascular Surgery

## 2016-10-15 ENCOUNTER — Encounter (INDEPENDENT_AMBULATORY_CARE_PROVIDER_SITE_OTHER): Payer: BLUE CROSS/BLUE SHIELD

## 2016-11-15 ENCOUNTER — Encounter (INDEPENDENT_AMBULATORY_CARE_PROVIDER_SITE_OTHER): Payer: BLUE CROSS/BLUE SHIELD

## 2016-11-15 ENCOUNTER — Ambulatory Visit (INDEPENDENT_AMBULATORY_CARE_PROVIDER_SITE_OTHER): Payer: BLUE CROSS/BLUE SHIELD | Admitting: Vascular Surgery

## 2016-12-18 ENCOUNTER — Other Ambulatory Visit (INDEPENDENT_AMBULATORY_CARE_PROVIDER_SITE_OTHER): Payer: Self-pay | Admitting: Vascular Surgery

## 2016-12-18 DIAGNOSIS — M7989 Other specified soft tissue disorders: Secondary | ICD-10-CM

## 2016-12-27 ENCOUNTER — Ambulatory Visit (INDEPENDENT_AMBULATORY_CARE_PROVIDER_SITE_OTHER): Payer: BLUE CROSS/BLUE SHIELD

## 2016-12-27 ENCOUNTER — Ambulatory Visit (INDEPENDENT_AMBULATORY_CARE_PROVIDER_SITE_OTHER): Payer: BLUE CROSS/BLUE SHIELD | Admitting: Vascular Surgery

## 2016-12-27 ENCOUNTER — Encounter (INDEPENDENT_AMBULATORY_CARE_PROVIDER_SITE_OTHER): Payer: Self-pay | Admitting: Vascular Surgery

## 2016-12-27 VITALS — BP 156/74 | HR 70 | Resp 17 | Ht 72.0 in | Wt 335.0 lb

## 2016-12-27 DIAGNOSIS — I890 Lymphedema, not elsewhere classified: Secondary | ICD-10-CM | POA: Diagnosis not present

## 2016-12-27 DIAGNOSIS — E118 Type 2 diabetes mellitus with unspecified complications: Secondary | ICD-10-CM | POA: Diagnosis not present

## 2016-12-27 DIAGNOSIS — M7989 Other specified soft tissue disorders: Secondary | ICD-10-CM | POA: Diagnosis not present

## 2016-12-27 DIAGNOSIS — E782 Mixed hyperlipidemia: Secondary | ICD-10-CM | POA: Diagnosis not present

## 2016-12-27 DIAGNOSIS — I872 Venous insufficiency (chronic) (peripheral): Secondary | ICD-10-CM | POA: Diagnosis not present

## 2016-12-27 NOTE — Progress Notes (Signed)
MRN : [id number]  [name]. is a 66 y.o. ([birth year]) male who presents with chief complaint of  Chief Complaint  Patient presents with  . Follow-up    6mo. BLE vein reflux  .  History of Present Illness: The patient returns to the office for followup evaluation regarding leg swelling.  The swelling has persisted and the pain associated with swelling continues. There have not been any interval development of a ulcerations or wounds.  Since the previous visit the patient has been wearing graduated compression stockings and has noted little if any improvement in the lymphedema. The patient has been using compression routinely morning until night.  The patient also states elevation during the day and exercise is being done too.   Current Meds  Medication Sig  . aspirin 81 MG tablet Take 81 mg by mouth daily.  . Cyanocobalamin 1000 MCG/ML KIT Inject as directed.  . etodolac (LODINE) 500 MG tablet Take 500 mg by mouth daily as needed.  . insulin glargine (LANTUS) 100 UNIT/ML injection Inject 54 Units into the skin daily.  . insulin lispro (HUMALOG) 100 UNIT/ML injection Inject into the skin as needed for high blood sugar (sliding scale).  .Marland Kitchenlevothyroxine (SYNTHROID, LEVOTHROID) 150 MCG tablet Take 150 mcg by mouth daily before breakfast.  . metFORMIN (GLUCOPHAGE) 1000 MG tablet Take 1,000 mg by mouth 2 (two) times daily with a meal.  . mycophenolate (CELLCEPT) 250 MG capsule Take by mouth 2 (two) times daily. 1500 mg AM, 1250 mg PM  . pantoprazole (PROTONIX) 40 MG tablet Take 40 mg by mouth 2 (two) times daily.  . pioglitazone (ACTOS) 45 MG tablet Take 45 mg by mouth daily.  . pravastatin (PRAVACHOL) 80 MG tablet Take 80 mg by mouth daily.  . predniSONE (DELTASONE) 10 MG tablet Take 30 mg by mouth daily with breakfast.    Past Medical History:  Diagnosis Date  . Anemia   . [place] complication ([place]   . Hypercholesteremia   . Hypothyroidism   .  Kidney stones   . Leg weakness, bilateral    due to Myasthenia Gravis  . [place]   . Wears hearing aid    bilateral    Past Surgical History:  Procedure Laterality Date  . BACK SURGERY  09/2015  . CARDIAC CATHETERIZATION    . CATARACT EXTRACTION W/ INTRAOCULAR LENS IMPLANT    . CATARACT EXTRACTION W/PHACO Right 12/21/2015   Procedure: CATARACT EXTRACTION PHACO AND INTRAOCULAR LENS PLACEMENT (IOC);  Surgeon: CLeandrew Koyanagi MD;  [facility]  Service: Ophthalmology;  Laterality: Right;  DIABETIC - insulin and oral meds  . COLONOSCOPY    . SHOULDER SURGERY     labrum repair  . TONSILLECTOMY      Social History Social History  Substance Use Topics  . Smoking status: Never Smoker  . Smokeless tobacco: Never Used  . Alcohol use No    Family History Family History  Problem Relation Age of Onset  . Clotting disorder Father   . Heart attack Father     Allergies  Allergen Reactions  . Omnicef [Cefdinir]     C. Diff  . Penicillins Rash    Large doses     REVIEW OF SYSTEMS (Negative unless checked)  Constitutional: _0 Weight loss  _1 Fever  _2 Chills Cardiac: _3 Chest pain   _4 Chest pressure   _5 Palpitations   _6 Shortness of breath when laying flat   _7 Shortness of breath with exertion. Vascular:  _8 Pain  in legs with walking   _0 Pain in legs with standing  _1 History of DVT   _2 Phlebitis   _3 Swelling in legs   _4 Varicose veins   _5 Non-healing ulcers Pulmonary:   _6 Uses home oxygen   _7 Productive cough   _8 Hemoptysis   _9 Wheeze  _10 COPD   _11 Asthma Neurologic:  _12 Dizziness   _13 Seizures   _14 History of stroke   _15 History of TIA  _16 Aphasia   _17 Vissual changes   _18 Weakness or numbness in arm   _19 Weakness or numbness in leg Musculoskeletal:   _20 Joint swelling   _21 Joint pain   _22 Low back pain Hematologic:  _23 Easy bruising  _24 Easy bleeding   _25 Hypercoagulable state   _26 Anemic Gastrointestinal:  _27 Diarrhea   _28 Vomiting  _29 Gastroesophageal  reflux/heartburn   _30 Difficulty swallowing. Genitourinary:  _31 Chronic kidney disease   _32 Difficult urination  _33 Frequent urination   _34 Blood in urine Skin:  _35 Rashes   _36 Ulcers  Psychological:  _37 History of anxiety   _38  History of major depression.  Physical Examination  Vitals:   12/27/16 1316  BP: (!) 156/74  Pulse: 70  Resp: 17  Weight: (!) 152 kg (335 lb)  Height: 6' (1.829 m)   Body mass index is 45.43 kg/m. Gen: WD/WN, NAD Head: [place]/AT, No temporalis wasting.  Ear/Nose/Throat: Hearing grossly intact, nares w/o erythema or drainage Eyes: PER, EOMI, sclera nonicteric.  Neck: Supple, no large masses.   Pulmonary:  Good air movement, no audible wheezing bilaterally, no use of accessory muscles.  Cardiac: RRR, no JVD Vascular: scattered varicosities present bilateraly.  Moderate venous stasis changes to the legs bilaterally.  3-4+ soft pitting edema Vessel Right Left  Radial Palpable Palpable  PT Palpable Palpable  DP Palpable Palpable  Gastrointestinal: Non-distended. No guarding/no peritoneal signs.  Musculoskeletal: M/S 5/5 throughout.  No deformity or atrophy.  Neurologic: CN 2-12 intact. Symmetrical.  Speech is fluent. Motor exam as listed above. Psychiatric: Judgment intact, Mood & affect appropriate for pt's clinical situation. Dermatologic: Venous rashes no ulcers noted.  No changes consistent with cellulitis. Lymph : No lichenification or skin changes of chronic lymphedema.  CBC Lab Results  Component Value Date   WBC 8.4 01/02/2016   HGB 12.6 (L) 01/02/2016   HCT 37.7 (L) 01/02/2016   MCV 90.4 01/02/2016   PLT 287 01/02/2016    BMET    Component Value Date/Time   NA 134 (L) 01/02/2016 1551   K 5.5 (H) 01/02/2016 1551   CL 108 01/02/2016 1551   CO2 18 (L) 01/02/2016 1551   GLUCOSE 298 (H) 01/02/2016 1551   BUN 34 (H) 01/02/2016 1551   CREATININE 1.46 (H) 01/02/2016 1551   CALCIUM 9.1 01/02/2016 1551   GFRNONAA 50 (L) 01/02/2016 1551   GFRAA 58 (L)  01/02/2016 1551   CrCl cannot be calculated (Patient's most recent lab result is older than the maximum 21 days allowed.).  COAG No results found for: INR, PROTIME  Radiology No results found.  Assessment/Plan 1. Chronic venous insufficiency Recommend:  No surgery or intervention at this point in time.    I have reviewed my previous discussion with the patient regarding swelling and why it causes symptoms.  Patient will continue wearing graduated compression stockings class 1 (20-30 mmHg) on a daily basis. The patient will  beginning wearing the stockings first thing in the morning and removing them in the evening. The patient is instructed specifically not to sleep in the stockings.    In addition, behavioral modification including several periods of elevation of the lower extremities during the  day will be continued.  This was reviewed with the patient during the initial visit.  The patient will also continue routine exercise, especially walking on a daily basis as was discussed during the initial visit.    Despite conservative treatments including graduated compression therapy class 1 and behavioral modification including exercise and elevation the patient  has not obtained adequate control of the lymphedema.  The patient still has stage 3 lymphedema and therefore, I believe that a lymph pump should be added to improve the control of the patient's lymphedema.  Additionally, a lymph pump is warranted because it will reduce the risk of cellulitis and ulceration in the future.  Patient should follow-up in six months    2. Lymphedema Recommend:  No surgery or intervention at this point in time.    I have reviewed my previous discussion with the patient regarding swelling and why it causes symptoms.  Patient will continue wearing graduated compression stockings class 1 (20-30 mmHg) on a daily basis. The patient will  beginning wearing the stockings first thing in the morning and  removing them in the evening. The patient is instructed specifically not to sleep in the stockings.    In addition, behavioral modification including several periods of elevation of the lower extremities during the day will be continued.  This was reviewed with the patient during the initial visit.  The patient will also continue routine exercise, especially walking on a daily basis as was discussed during the initial visit.    Despite conservative treatments including graduated compression therapy class 1 and behavioral modification including exercise and elevation the patient  has not obtained adequate control of the lymphedema.  The patient still has stage 3 lymphedema and therefore, I believe that a lymph pump should be added to improve the control of the patient's lymphedema.  Additionally, a lymph pump is warranted because it will reduce the risk of cellulitis and ulceration in the future.  Patient should follow-up in six months    3. Swelling of limb See #1&2  4. Mixed hyperlipidemia Continue statin as ordered and reviewed, no changes at this time   5. Type 2 diabetes mellitus with complication, without long-term current use of insulin (HCC) Continue hypoglycemic medications as already ordered, these medications have been reviewed and there are no changes at this time.  Hgb A1C to be monitored as already arranged by primary service     Hortencia Pilar, MD  12/27/2016 3:09 PM

## 2017-04-25 ENCOUNTER — Other Ambulatory Visit: Payer: Self-pay | Admitting: Orthopedic Surgery

## 2017-04-25 DIAGNOSIS — M545 Low back pain: Secondary | ICD-10-CM

## 2017-04-25 DIAGNOSIS — M79605 Pain in left leg: Principal | ICD-10-CM

## 2017-05-02 ENCOUNTER — Ambulatory Visit
Admission: RE | Admit: 2017-05-02 | Discharge: 2017-05-02 | Disposition: A | Discharge status: Home / Self Care | Payer: BLUE CROSS/BLUE SHIELD | Source: Ambulatory Visit | Attending: Orthopedic Surgery | Admitting: Orthopedic Surgery

## 2017-05-02 DIAGNOSIS — Z981 Arthrodesis status: Secondary | ICD-10-CM | POA: Diagnosis not present

## 2017-05-02 DIAGNOSIS — M5106 Intervertebral disc disorders with myelopathy, lumbar region: Secondary | ICD-10-CM | POA: Diagnosis not present

## 2017-05-02 DIAGNOSIS — M5416 Radiculopathy, lumbar region: Secondary | ICD-10-CM | POA: Insufficient documentation

## 2017-05-02 DIAGNOSIS — M1288 Other specific arthropathies, not elsewhere classified, other specified site: Secondary | ICD-10-CM | POA: Insufficient documentation

## 2017-05-02 DIAGNOSIS — M48061 Spinal stenosis, lumbar region without neurogenic claudication: Secondary | ICD-10-CM | POA: Diagnosis not present

## 2017-05-02 DIAGNOSIS — M545 Low back pain: Secondary | ICD-10-CM

## 2017-05-02 DIAGNOSIS — M79605 Pain in left leg: Secondary | ICD-10-CM

## 2017-07-15 ENCOUNTER — Ambulatory Visit (INDEPENDENT_AMBULATORY_CARE_PROVIDER_SITE_OTHER): Payer: BLUE CROSS/BLUE SHIELD | Admitting: Vascular Surgery

## 2017-09-09 ENCOUNTER — Encounter: Payer: BLUE CROSS/BLUE SHIELD | Attending: Physician Assistant | Admitting: Physician Assistant

## 2017-09-09 DIAGNOSIS — E11622 Type 2 diabetes mellitus with other skin ulcer: Secondary | ICD-10-CM | POA: Diagnosis present

## 2017-09-09 DIAGNOSIS — E1151 Type 2 diabetes mellitus with diabetic peripheral angiopathy without gangrene: Secondary | ICD-10-CM | POA: Insufficient documentation

## 2017-09-09 DIAGNOSIS — L97822 Non-pressure chronic ulcer of other part of left lower leg with fat layer exposed: Secondary | ICD-10-CM | POA: Insufficient documentation

## 2017-09-09 DIAGNOSIS — I872 Venous insufficiency (chronic) (peripheral): Secondary | ICD-10-CM | POA: Diagnosis not present

## 2017-09-09 DIAGNOSIS — I890 Lymphedema, not elsewhere classified: Secondary | ICD-10-CM | POA: Insufficient documentation

## 2017-09-09 DIAGNOSIS — Z794 Long term (current) use of insulin: Secondary | ICD-10-CM | POA: Insufficient documentation

## 2017-09-09 DIAGNOSIS — I10 Essential (primary) hypertension: Secondary | ICD-10-CM | POA: Insufficient documentation

## 2017-09-09 DIAGNOSIS — G7000 Myasthenia gravis without (acute) exacerbation: Secondary | ICD-10-CM | POA: Insufficient documentation

## 2017-09-11 NOTE — Progress Notes (Signed)
[name] ([id number]) Visit Report for 09/09/2017 Allergy List Details Patient Name: [name], [name]. Date of Service: 09/09/2017 8:00 AM Medical Record Number: [id number] Patient Account Number: [id number] Date of Birth/Sex: [birth year] (66 y.o. M) Treating RN: [name] Primary Care [name]: [name] Other Clinician: Referring [name]: [name] Treating [name]/Extender: [name] Weeks in Treatment: 0 Allergies Active Allergies penicillin Omnicef Allergy Notes Electronic Signature(s) Signed: 09/09/2017 5:21:58 PM By: [name] Entered By: [name] on 09/09/2017 08:19:15 [name] ([id number]) -------------------------------------------------------------------------------- Arrival Information Details Patient Name: [name], [name]. Date of Service: 09/09/2017 8:00 AM Medical Record Number: [id number] Patient Account Number: [id number] Date of Birth/Sex: Feb 12, 1951 (66 y.o. M) Treating RN: [name] Primary Care [name]: [name] Other Clinician: Referring [name]: [name] Treating [name]/Extender: [name] Weeks in Treatment: 0 Visit Information Patient Arrived: Cane Arrival Time: 08:14 Accompanied By: self Transfer Assistance: EasyPivot Patient Lift Patient Identification Verified: Yes Secondary Verification Process Yes Completed: Patient Requires Transmission-Based No Precautions: Patient Has Alerts: Yes Patient Alerts: DM II Electronic Signature(s) Signed: 09/09/2017 5:21:58 PM By: [name] Entered By: [name] on 09/09/2017 08:16:14 [name] ([id number]) -------------------------------------------------------------------------------- Clinic Level of Care Assessment Details Patient Name: [name]. Date of Service: 09/09/2017 8:00 AM Medical Record Number: [id number] Patient Account Number: [id number] Date of Birth/Sex: [birth year] (66 y.o.  M) Treating RN: [name] Primary Care Adriannah Steinkamp: [name] Other Clinician: Referring [name]: [name] Treating [name]/Extender: [name] Weeks in Treatment: 0 Clinic Level of Care Assessment Items TOOL 1 Quantity Score X - Use when EandM and Procedure is performed on INITIAL visit 1 0 ASSESSMENTS - Nursing Assessment / Reassessment X - General Physical Exam (combine w/ comprehensive assessment (listed just below) when 1 20 performed on new pt. evals) X- 1 25 Comprehensive Assessment (HX, ROS, Risk Assessments, Wounds Hx, etc.) ASSESSMENTS - Wound and Skin Assessment / Reassessment X - Dermatologic / Skin Assessment (not related to wound area) 1 10 ASSESSMENTS - Ostomy and/or Continence Assessment and Care []  - Incontinence Assessment and Management 0 []  - 0 Ostomy Care Assessment and Management (repouching, etc.) PROCESS - Coordination of Care X - Simple Patient / Family Education for ongoing care 1 15 []  - 0 Complex (extensive) Patient / Family Education for ongoing care []  - 0 Staff obtains Chiropractor, Records, Test Results / Process Orders []  - 0 Staff telephones HHA, Nursing Homes / Clarify orders / etc []  - 0 Routine Transfer to another Facility (non-emergent condition) []  - 0 Routine Hospital Admission (non-emergent condition) []  - 0 New Admissions / Manufacturing engineer / Ordering NPWT, Apligraf, etc. []  - 0 Emergency Hospital Admission (emergent condition) PROCESS - Special Needs []  - Pediatric / Minor Patient Management 0 []  - 0 Isolation Patient Management []  - 0 Hearing / Language / Visual special needs []  - 0 Assessment of Community assistance (transportation, D/C planning, etc.) []  - 0 Additional assistance / Altered mentation []  - 0 Support Surface(s) Assessment (bed, cushion, seat, etc.) [name], [name]. ([id number]) INTERVENTIONS - Miscellaneous []  - External ear exam 0 []  - 0 Patient Transfer (multiple staff /  Nurse, adult / Similar devices) []  - 0 Simple Staple / Suture removal (25 or less) []  - 0 Complex Staple / Suture removal (26 or more) []  - 0 Hypo/Hyperglycemic Management (do not check if billed separately) X- 1 15 Ankle / Brachial Index (ABI) - do not check if billed separately Has the patient been seen at the hospital within  the last three years: Yes Total Score: 85 Level Of Care: New/Established - Level 3 Electronic Signature(s) Signed: 09/09/2017 5:06:21 PM By: [name] Entered By: [name] on 09/09/2017 09:07:51 [place], [name] Wright ([id number]) -------------------------------------------------------------------------------- Encounter Discharge Information Details Patient Name: [name], Douglas L. Date of Service: 09/09/2017 8:00 AM Medical Record Number: [id number] Patient Account Number: [id number] Date of Birth/Sex: Aug 04, 1950 (66 y.o. M) Treating RN: [name] Primary Care [name]: [name] Other Clinician: Referring [name]: [name] Treating [name]/Extender: [name], Wright Weeks in Treatment: 0 Encounter Discharge Information Items Discharge Condition: Stable Ambulatory Status: Cane Discharge Destination: Home Transportation: Private Auto Accompanied By: self Schedule Follow-up Appointment: Yes Clinical Summary of Care: Electronic Signature(s) Signed: 09/09/2017 10:49:03 AM By: [name] Entered By: [name] on 09/09/2017 10:49:03 [name] ([id number]) -------------------------------------------------------------------------------- Lower Extremity Assessment Details Patient Name: [name], Douglas L. Date of Service: 09/09/2017 8:00 AM Medical Record Number: [id number] Patient Account Number: [id number] Date of Birth/Sex: 16-Aug-1950 (66 y.o. M) Treating RN: [name] Primary Care [name]: [name] Other Clinician: Referring [name]: [name] Treating [name]/Extender: [name] Weeks in  Treatment: 0 Edema Assessment Assessed: [Left: No] [Right: No] [Left: Edema] [Right: :] Calf Left: Right: Point of Measurement: 35 cm From Medial Instep 43 cm 43.7 cm Ankle Left: Right: Point of Measurement: 14 cm From Medial Instep 24.8 cm 22.7 cm Vascular Assessment Pulses: Dorsalis Pedis Palpable: [Left:Yes] [Right:Yes] Posterior Tibial Extremity colors, hair growth, and conditions: Extremity Color: [Left:Mottled] [Right:Mottled] Hair Growth on Extremity: [Left:Yes] [Right:Yes] Temperature of Extremity: [Left:Warm] [Right:Warm] Capillary Refill: [Left:< 3 seconds] [Right:< 3 seconds] Blood Pressure: Brachial: [Left:130] [Right:130] Dorsalis Pedis: 172 [Left:Dorsalis Pedis: 178] Ankle: Posterior Tibial: 200 [Left:Posterior Tibial: 200 1.54] [Right:1.54] Toe Nail Assessment Left: Right: Thick: Yes Yes Discolored: No No Deformed: No Improper Length and Hygiene: Yes Yes Electronic Signature(s) Signed: 09/09/2017 5:21:58 PM By: [name] Previous Signature: 09/09/2017 8:30:04 AM Version By: [name] Entered By: [name] on 09/09/2017 08:39:49 Welcome, [name] Wright ([id number]) -------------------------------------------------------------------------------- Multi Wound Chart Details Patient Name: [name], Douglas L. Date of Service: 09/09/2017 8:00 AM Medical Record Number: [id number] Patient Account Number: [id number] Date of Birth/Sex: 1950-08-19 (66 y.o. M) Treating RN: [name] Primary Care Keisi Eckford: [name] Other Clinician: Referring Josephyne Tarter: [name] Treating Jonelle Bann/Extender: [name] Weeks in Treatment: 0 Vital Signs Height(in): 72 Pulse(bpm): 69 Weight(lbs): 323.9 Blood Pressure(mmHg): 140/62 Body Mass Index(BMI): 44 Temperature(F): 97.7 Respiratory Rate 18 (breaths/min): Photos: [N/A:N/A] Wound Location: Left Lower Leg - Lateral N/A N/A Wounding Event: Gradually Appeared N/A N/A Primary Etiology: Diabetic  Wound/Ulcer of the N/A N/A Lower Extremity Comorbid History: Cataracts, Anemia, N/A N/A Lymphedema, Hypertension, Type II Diabetes Date Acquired: 08/26/2017 N/A N/A Weeks of Treatment: 0 N/A N/A Wound Status: Open N/A N/A Measurements L x W x D 1.9x2.1x0.1 N/A N/A (cm) Area (cm) : 3.134 N/A N/A Volume (cm) : 0.313 N/A N/A Classification: Grade 1 N/A N/A Exudate Amount: Large N/A N/A Exudate Type: Serous N/A N/A Exudate Color: amber N/A N/A Wound Margin: Flat and Intact N/A N/A Granulation Amount: Large (67-100%) N/A N/A Granulation Quality: Red N/A N/A Necrotic Amount: Small (1-33%) N/A N/A Exposed Structures: Fat Layer (Subcutaneous N/A N/A Tissue) Exposed: Yes Fascia: No Tendon: No Muscle: No Joint: No Bone: No Hammitt, Leevi L. ([id number]) Epithelialization: None N/A N/A Periwound Skin Texture: Excoriation: No N/A N/A Induration: No Callus: No Crepitus: No Rash: No Scarring: No Periwound Skin Moisture: Maceration: No N/A N/A Dry/Scaly: No Periwound Skin Color: Erythema: Yes N/A N/A Atrophie  Blanche: No Cyanosis: No Ecchymosis: No Hemosiderin Staining: No Mottled: No Pallor: No Rubor: No Erythema Location: Circumferential N/A N/A Temperature: No Abnormality N/A N/A Tenderness on Palpation: Yes N/A N/A Wound Preparation: Ulcer Cleansing: N/A N/A Rinsed/Irrigated with Saline Topical Anesthetic Applied: Other: lidocaine 4% Treatment Notes Electronic Signature(s) Signed: 09/09/2017 5:06:21 PM By: Douglas CriglerFlinchum, Cheryl Entered By: Douglas CriglerFlinchum, Cheryl on 09/09/2017 08:52:55 [name], Narayan L. ([id number]) -------------------------------------------------------------------------------- Multi-Disciplinary Care Plan Details Patient Name: [name], Douglas L. Date of Service: 09/09/2017 8:00 AM Medical Record Number: [id number] Patient Account Number: [id number] Date of Birth/Sex: Aug 31, 1950 (66 y.o. M) Treating RN: Douglas CriglerFlinchum, Cheryl Primary Care Natilie Krabbenhoft: Douglas ShamesSINGH,  JASMINE Other Clinician: Referring Aayla Marrocco: Douglas ShamesSINGH, JASMINE Treating Rashid Whitenight/Extender: [name] Weeks in Treatment: 0 Active Inactive ` Orientation to the Wound Care Program Nursing Diagnoses: Knowledge deficit related to the wound healing center program Goals: Patient/caregiver will verbalize understanding of the Wound Healing Center Program Date Initiated: 09/09/2017 Target Resolution Date: 09/30/2017 Goal Status: Active Interventions: Provide education on orientation to the wound center Notes: ` Wound/Skin Impairment Nursing Diagnoses: Impaired tissue integrity Goals: Patient/caregiver will verbalize understanding of skin care regimen Date Initiated: 09/09/2017 Target Resolution Date: 09/28/2017 Goal Status: Active Ulcer/skin breakdown will have a volume reduction of 30% by week 4 Date Initiated: 09/09/2017 Target Resolution Date: 09/28/2017 Goal Status: Active Interventions: Assess patient/caregiver ability to obtain necessary supplies Assess patient/caregiver ability to perform ulcer/skin care regimen upon admission and as needed Assess ulceration(s) every visit Treatment Activities: Skin care regimen initiated : 09/09/2017 Notes: Electronic Signature(s) Signed: 09/09/2017 5:06:21 PM By: Sofie RowerFlinchum, Cheryl Zurcher, [name] LangoICHARD L. ([id number]) Entered By: Douglas CriglerFlinchum, Cheryl on 09/09/2017 08:52:41 [name], Weldon L. ([id number]) -------------------------------------------------------------------------------- Pain Assessment Details Patient Name: [name], Douglas L. Date of Service: 09/09/2017 8:00 AM Medical Record Number: [id number] Patient Account Number: [id number] Date of Birth/Sex: Aug 31, 1950 (66 y.o. M) Treating RN: Douglas HaggisPinkerton, Debi Primary Care Kalesha Irving: Douglas ShamesSINGH, JASMINE Other Clinician: Referring Nilam Quakenbush: Douglas ShamesSINGH, JASMINE Treating Carisha Kantor/Extender: [name] Weeks in Treatment: 0 Active Problems Location of Pain Severity and Description of Pain Patient Has Paino  No Site Locations Pain Management and Medication Current Pain Management: Electronic Signature(s) Signed: 09/09/2017 5:21:58 PM By: Douglas MullingPinkerton, Debra Entered By: Douglas MullingPinkerton, Debra on 09/09/2017 08:16:27 [name], Ladislao L. ([id number]) -------------------------------------------------------------------------------- Patient/Caregiver Education Details Patient Name: [name], Douglas L. Date of Service: 09/09/2017 8:00 AM Medical Record Number: [id number] Patient Account Number: [id number] Date of Birth/Gender: Aug 31, 1950 (66 y.o. M) Treating RN: Douglas Sitesorthy, Joanna Primary Care Physician: Douglas ShamesSINGH, JASMINE Other Clinician: Referring Physician: Leotis ShamesSINGH, JASMINE Treating Physician/Extender: Douglas DibblesSTONE III, Wright Weeks in Treatment: 0 Education Assessment Education Provided To: Patient Education Topics Provided Venous: Handouts: Other: wrap precautions Methods: Explain/Verbal Responses: State content correctly Electronic Signature(s) Signed: 09/09/2017 5:27:17 PM By: Douglas Sitesorthy, Joanna Entered By: Douglas Sitesorthy, Joanna on 09/09/2017 10:49:25 Hodes, [name] LangoICHARD L. ([id number]) -------------------------------------------------------------------------------- Wound Assessment Details Patient Name: [name], Domenico L. Date of Service: 09/09/2017 8:00 AM Medical Record Number: [id number] Patient Account Number: [id number] Date of Birth/Sex: Aug 31, 1950 (66 y.o. M) Treating RN: Douglas Sitesorthy, Joanna Primary Care Rigel Filsinger: Douglas ShamesSINGH, JASMINE Other Clinician: Referring Cormac Wint: Douglas ShamesSINGH, JASMINE Treating Maleiyah Releford/Extender: [name] Weeks in Treatment: 0 Wound Status Wound Number: 1 Primary Diabetic Wound/Ulcer of the Lower Extremity Etiology: Wound Location: Left Lower Leg - Lateral Wound Status: Open Wounding Event: Gradually Appeared Comorbid Cataracts, Anemia, Lymphedema, Date Acquired: 08/26/2017 History: Hypertension, Type II Diabetes Weeks Of Treatment: 0 Clustered Wound: No Photos Photo Uploaded By: Douglas Sitesorthy, Joanna on  09/09/2017 08:32:07 Wound Measurements Length: (cm) 1.9 Width: (cm) 2.1 Depth: (cm) 0.1 Area: (cm) 3.134 Volume: (cm)  0.313 % Reduction in Area: % Reduction in Volume: Epithelialization: None Tunneling: No Undermining: No Wound Description Classification: Grade 1 Wound Margin: Flat and Intact Exudate Amount: Large Exudate Type: Serous Exudate Color: amber Foul Odor After Cleansing: No Slough/Fibrino Yes Wound Bed Granulation Amount: Large (67-100%) Exposed Structure Granulation Quality: Red Fascia Exposed: No Necrotic Amount: Small (1-33%) Fat Layer (Subcutaneous Tissue) Exposed: Yes Necrotic Quality: Adherent Slough Tendon Exposed: No Muscle Exposed: No Joint Exposed: No Bone Exposed: No Periwound Skin Texture Rost, Dmarcus L. ([id number]) Texture Color No Abnormalities Noted: No No Abnormalities Noted: No Callus: No Atrophie Blanche: No Crepitus: No Cyanosis: No Excoriation: No Ecchymosis: No Induration: No Erythema: Yes Rash: No Erythema Location: Circumferential Scarring: No Hemosiderin Staining: No Mottled: No Moisture Pallor: No No Abnormalities Noted: No Rubor: No Dry / Scaly: No Maceration: No Temperature / Pain Temperature: No Abnormality Tenderness on Palpation: Yes Wound Preparation Ulcer Cleansing: Rinsed/Irrigated with Saline Topical Anesthetic Applied: Other: lidocaine 4%, Treatment Notes Wound #1 (Left, Lateral Lower Leg) 1. Cleansed with: Clean wound with Normal Saline 2. Anesthetic Topical Lidocaine 4% cream to wound bed prior to debridement 4. Dressing Applied: Iodoflex 5. Secondary Dressing Applied ABD Pad 7. Secured with 3 Layer Compression System - Left Lower Extremity Electronic Signature(s) Signed: 09/09/2017 8:31:20 AM By: [name] Entered By: [name] on 09/09/2017 08:31:20 [name] ([id number]) -------------------------------------------------------------------------------- Vitals  Details Patient Name: TAVONE, CAESAR L. Date of Service: 09/09/2017 8:00 AM Medical Record Number: [id number] Patient Account Number: [id number] Date of Birth/Sex: [birth year] (66 y.o. M) Treating RN: Huel Coventry Primary Care Reily Treloar: [name] Other Clinician: Referring Jacalyn Biggs: [name] Treating Quintus Premo/Extender: [name] Weeks in Treatment: 0 Vital Signs Time Taken: 08:15 Temperature (F): 97.7 Height (in): 72 Pulse (bpm): 69 Weight (lbs): 323.9 Respiratory Rate (breaths/min): 18 Source: Measured Blood Pressure (mmHg): 140/62 Body Mass Index (BMI): 43.9 Reference Range: 80 - 120 mg / dl Electronic Signature(s) Signed: 09/09/2017 5:21:58 PM By: [name] Previous Signature: 09/09/2017 8:16:22 AM Version By: Elliot Gurney, BSN, RN, CWS, Kim RN, BSN Entered By: [name] on 09/09/2017 08:18:58

## 2017-09-11 NOTE — Progress Notes (Signed)
SALATHIEL, FERRARA ([id number]) Visit Report for 09/09/2017 Chief Complaint Document Details Patient Name: [name], [name]. Date of Service: 09/09/2017 8:00 AM Medical Record Number: [id number] Patient Account Number: [id number] Date of Birth/Sex: 11/04/1950 (67 y.o. M) Treating RN: Renne Crigler Primary Care Provider: Leotis Shames Other Clinician: Referring Provider: Leotis Shames Treating Provider/Extender: Linwood Dibbles, Riann Oman Weeks in Treatment: 0 Information Obtained from: Patient Chief Complaint Left leg ulcer Electronic Signature(s) Signed: 09/10/2017 1:56:08 AM By: Lenda Kelp PA-C Entered By: Lenda Kelp on 09/09/2017 09:12:37 [name] ([id number]) -------------------------------------------------------------------------------- Debridement Details Patient Name: [name], Douglas L. Date of Service: 09/09/2017 8:00 AM Medical Record Number: [id number] Patient Account Number: [id number] Date of Birth/Sex: [birth year] (67 y.o. M) Treating RN: Renne Crigler Primary Care Provider: Leotis Shames Other Clinician: Referring Provider: Leotis Shames Treating Provider/Extender: STONE III, Avabella Wailes Weeks in Treatment: 0 Debridement Performed for Wound #1 Left,Lateral Lower Leg Assessment: Performed By: Physician STONE III, Shermon Bozzi E., PA-C Debridement Type: Debridement Severity of Tissue Pre Fat layer exposed Debridement: Pre-procedure Verification/Time Yes - 08:53 Out Taken: Start Time: 08:53 Pain Control: Other : lidocaine 4% Total Area Debrided (L x W): 1.9 (cm) x 2.1 (cm) = 3.99 (cm) Tissue and other material Viable, Non-Viable, Slough, Subcutaneous, Biofilm, Slough debrided: Level: Skin/Subcutaneous Tissue Debridement Description: Excisional Instrument: Curette Bleeding: Minimum Hemostasis Achieved: Pressure End Time: 08:59 Procedural Pain: 0 Post Procedural Pain: 0 Response to Treatment: Procedure was tolerated well Level of Consciousness: Awake  and Alert Post Procedure Vitals: Temperature: 97.7 Pulse: 67 Respiratory Rate: 18 Blood Pressure: Systolic Blood Pressure: 140 Diastolic Blood Pressure: 62 Post Debridement Measurements of Total Wound Length: (cm) 1.9 Width: (cm) 2.1 Depth: (cm) 0.1 Volume: (cm) 0.313 Character of Wound/Ulcer Post Debridement: Stable Severity of Tissue Post Debridement: Fat layer exposed Post Procedure Diagnosis Same as Pre-procedure Electronic Signature(s) Signed: 09/09/2017 5:06:21 PM By: Renne Crigler Signed: 09/10/2017 1:56:08 AM By: Marland Kitchen, [place] ([id number]) Entered By: Renne Crigler on 09/09/2017 08:58:56 [name], [name] ([id number]) -------------------------------------------------------------------------------- HPI Details Patient Name: [name], Douglas L. Date of Service: 09/09/2017 8:00 AM Medical Record Number: [id number] Patient Account Number: [id number] Date of Birth/Sex: Nov 05, 1950 (67 y.o. M) Treating RN: Renne Crigler Primary Care Provider: Leotis Shames Other Clinician: Referring Provider: Leotis Shames Treating Provider/Extender: STONE III, Friend Dorfman Weeks in Treatment: 0 History of Present Illness HPI Description: MYASTHENIA GRAVIS (MG) MEDICATION ALERT: Medications that increase weakness in most MG patients should be avoided or used only with great caution. These include: - Fluoroquinolone antibiotics: moxifloxacin (Aveloxo), ciprofloxacin (Ciproo, Proquin XRo), norfloxacin (Noroxino), levofloxacin (Levaquino), ofloxacin (Floxino), gemifloxacin (Factiveo) - Aminoglycoside antibiotics: tobramycin, gentamycin, kanamycin, neomycin, streptomycin - Macrolide antibiotics: erythromycin, azithromycin (Z-Pako, Zithromaxo), telithromycin (Keteko) - Botulinum toxins: Botoxo, Myobloco, Dysporto, Xeomino - Beta-blockers such as propanolol or timolol eyedrops (Timoptico) - Calcium channel blockers (blood pressure medications) - Quinine, quinidine or  procainamide - D-penicillamine (do not confuse with penicillin) - Interferon - Magnesium salts: milk of magnesia, some antacids (Maaloxo, Mylantao) - Curare and related drugs (usually used only during surgery) Other medications may produce problems in some MG patients. Be sure your doctor/dentist knows you have MG when any new medication is prescribed. Contact your myasthenia doctor if you or your local doctor has questions about medications. 09/09/17 on evaluation today patient actually appears for initial evaluation today concerning his left lateral lower extremity ulcer which has been present he tells me since November 2018. He states that gas logs that he'd forgotten he had said somewhere actually fell over landing  on his leg causing the injury and unfortunately the left leg has never fully recovered since that time. He does note that there was some improvement initially and now it's pretty much stagnant. He does have venous stasis chronically although he states it's been worse and pass compared to current. He has also been diagnosed with lymphedema although again this is something else that has been deemed the worst in the past compared to present. He does have lymphedema pumps although he really has not been using those on a regular basis. Patient does have diabetes mellitus type II in other than the venous insufficiency and prayerful vascular disease he also has myasthenia gravis board she is on medications long-term. Currently he has been tolerating the medications for this very well he does have a list as noted above in the HPI of medications that he is to avoid that we need to keep in mind. Nonetheless the patient seems to be doing fairly well in general. I do believe his peripheral edema may be contributing to the nonhealing factor in regard to this ulcer. Electronic Signature(s) Signed: 09/10/2017 1:56:08 AM By: Lenda Kelp PA-C Entered By: Lenda Kelp on 09/10/2017  01:51:34 [name], [name] ([id number]) -------------------------------------------------------------------------------- Physical Exam Details Patient Name: [name], [name] Date of Service: 09/09/2017 8:00 AM Medical Record Number: [id number] Patient Account Number: [id number] Date of Birth/Sex: 1950/12/06 (66 y.o. M) Treating RN: Renne Crigler Primary Care Provider: Leotis Shames Other Clinician: Referring Provider: Leotis Shames Treating Provider/Extender: STONE III, Mikias Lanz Weeks in Treatment: 0 Constitutional patient is hypertensive.. pulse regular and within target range for patient.Marland Kitchen respirations regular, non-labored and within target range for patient.Marland Kitchen temperature within target range for patient.. Well-nourished and well-hydrated in no acute distress. Eyes conjunctiva clear no eyelid edema noted. pupils equal round and reactive to light and accommodation. Ears, Nose, Mouth, and Throat no gross abnormality of ear auricles or external auditory canals. normal hearing noted during conversation. mucus membranes moist. Respiratory normal breathing without difficulty. clear to auscultation bilaterally. Cardiovascular regular rate and rhythm with normal S1, S2. 1+ dorsalis pedis/posterior tibialis pulses. 1+ pitting edema of the bilateral lower extremities. Gastrointestinal (GI) soft, non-tender, non-distended, +BS. no ventral hernia noted. Musculoskeletal normal gait and posture. no significant deformity or arthritic changes, no loss or range of motion, no clubbing. Psychiatric this patient is able to make decisions and demonstrates good insight into disease process. Alert and Oriented x 3. pleasant and cooperative. Notes Patient's wound bed actually shows evidence of good granulation tissue at this point there's no evidence of infection which is good news. He has been tolerating the dressing changes at home although these have been minimal just what he could put together for  treatment so to speak. This has included triple antibiotic ointment and Vaseline. He's also left this open to air at times. The wound did require some debridement to remove the necrotic slough noted on the surface of the wound. Patient and she tolerated this fairly well today without complication. At least up until the end when I was getting a little bit more deep at the 1-3 o'clock location. I therefore decided to utilize medication to try to help loosen this up as opposed to continuing to "dig" in the area. Electronic Signature(s) Signed: 09/10/2017 1:56:08 AM By: Lenda Kelp PA-C Entered By: Lenda Kelp on 09/10/2017 01:53:56 [name], [name] ([id number]) -------------------------------------------------------------------------------- Physician Orders Details Patient Name: [name], PUCCINELLI. Date of Service: 09/09/2017 8:00 AM Medical Record Number: [id number] Patient Account Number:  [id number] Date of Birth/Sex: December 12, 1950 (68 y.o. M) Treating RN: Renne Crigler Primary Care Provider: Leotis Shames Other Clinician: Referring Provider: Leotis Shames Treating Provider/Extender: STONE III, Kendarious Gudino Weeks in Treatment: 0 Verbal / Phone Orders: No Diagnosis Coding ICD-10 Coding Code Description E11.622 Type 2 diabetes mellitus with other skin ulcer 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 of other part of left lower leg with fat layer exposed I89.0 Lymphedema, not elsewhere classified I87.2 Venous insufficiency (chronic) (peripheral) I73.9 Peripheral vascular disease, unspecified Wound Cleansing Wound #1 Left,Lateral Lower Leg o Clean wound with Normal Saline. Anesthetic (add to Medication List) Wound #1 Left,Lateral Lower Leg o Topical Lidocaine 4% cream applied to wound bed prior to debridement (In Clinic Only). Skin Barriers/Peri-Wound Care Wound #1 Left,Lateral Lower Leg o Moisturizing lotion Primary Wound Dressing Wound #1 Left,Lateral Lower Leg o  Iodoflex Secondary Dressing Wound #1 Left,Lateral Lower Leg o ABD pad Dressing Change Frequency Wound #1 Left,Lateral Lower Leg o Dressing is to be changed Monday and Thursday. Follow-up Appointments Wound #1 Left,Lateral Lower Leg o Nurse Visit as needed o Other: - return thursday for nurse visit Edema Control Wound #1 Left,Lateral Lower Leg o 3 Layer Compression System - Left Lower Extremity - unna to anchor PHILMORE, LEPORE L. ([id number]) o Compression Pump: Use compression pump on left lower extremity for 30 minutes, twice daily. - work up to 1 hour twice daily o Compression Pump: Use compression pump on right lower extremity for 30 minutes, twice daily. - work up to 1 hour twice daily Additional Orders / Instructions Wound #1 Left,Lateral Lower Leg o Vitamin A; Vitamin C, Zinc Electronic Signature(s) Signed: 09/09/2017 5:06:21 PM By: Renne Crigler Signed: 09/10/2017 1:56:08 AM By: Lenda Kelp PA-C Entered By: Renne Crigler on 09/09/2017 09:09:41 Jaye, [name] ([id number]) -------------------------------------------------------------------------------- Problem List Details Patient Name: [name], Douglas L. Date of Service: 09/09/2017 8:00 AM Medical Record Number: [id number] Patient Account Number: [id number] Date of Birth/Sex: [birth year] (66 y.o. M) Treating RN: Renne Crigler Primary Care Provider: Leotis Shames Other Clinician: Referring Provider: Leotis Shames Treating Provider/Extender: Linwood Dibbles, Amair Shrout Weeks in Treatment: 0 Active Problems ICD-10 Impacting Encounter Code Description Active Date Wound Healing Diagnosis E11.622 Type 2 diabetes mellitus with other skin ulcer 09/09/2017 No Yes 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 of other part of left lower leg with 09/09/2017 No Yes fat layer exposed I89.0 Lymphedema, not elsewhere classified 09/09/2017 No Yes G70.00 Myasthenia gravis without (acute) exacerbation 09/09/2017 No Yes I87.2  Venous insufficiency (chronic) (peripheral) 09/09/2017 No Yes I73.9 Peripheral vascular disease, unspecified 09/09/2017 No Yes Inactive Problems Resolved Problems Electronic Signature(s) Signed: 09/10/2017 1:56:08 AM By: Lenda Kelp PA-C Entered By: Lenda Kelp on 09/09/2017 09:11:12 Rojero, [name] ([id number]) -------------------------------------------------------------------------------- Progress Note Details Patient Name: [name]. Date of Service: 09/09/2017 8:00 AM Medical Record Number: [id number] Patient Account Number: [id number] Date of Birth/Sex: [birth year] (66 y.o. M) Treating RN: Renne Crigler Primary Care Provider: Leotis Shames Other Clinician: Referring Provider: Leotis Shames Treating Provider/Extender: STONE III, Raigan Baria Weeks in Treatment: 0 Subjective Chief Complaint Information obtained from Patient Left leg ulcer History of Present Illness (HPI) MYASTHENIA GRAVIS (MG) MEDICATION ALERT: Medications that increase weakness in most MG patients should be avoided or used only with great caution. These include: - Fluoroquinolone antibiotics: moxifloxacin (Avelox), ciprofloxacin (Cipro, Proquin XR), norfloxacin (Noroxin), levofloxacin (Levaquin), ofloxacin (Floxin), gemifloxacin (Factive) - Aminoglycoside antibiotics: tobramycin, gentamycin, kanamycin, neomycin, streptomycin - Macrolide antibiotics: erythromycin, azithromycin (Z-Pak, Zithromax), telithromycin (Ketek) - Botulinum toxins: Botox, Myobloc, Dysport, Xeomin - Beta-blockers such as propanolol or  timolol eyedrops (Timoptic) - Calcium channel blockers (blood pressure medications) - Quinine, quinidine or procainamide - D-penicillamine (do not confuse with penicillin) - Interferon - Magnesium salts: milk of magnesia, some antacids (Maalox, Mylanta) - Curare and related drugs (usually used only during surgery) Other medications may produce problems in some MG  patients. Be sure your doctor/dentist knows you have MG when any new medication is prescribed. Contact your myasthenia doctor if you or your local doctor has questions about medications. 09/09/17 on evaluation today patient actually appears for initial evaluation today concerning his left lateral lower extremity ulcer which has been present he tells me since November 2018. He states that gas logs that he'd forgotten he had said somewhere actually fell over landing on his leg causing the injury and unfortunately the left leg has never fully recovered since that time. He does note that there was some improvement initially and now it's pretty much stagnant. He does have venous stasis chronically although he states it's been worse and pass compared to current. He has also been diagnosed with lymphedema although again this is something else that has been deemed the worst in the past compared to present. He does have lymphedema pumps although he really has not been using those on a regular basis. Patient does have diabetes mellitus type II in other than the venous insufficiency and prayerful vascular disease he also has myasthenia gravis board she is on medications long-term. Currently he has been tolerating the medications for this very well he does have a list as noted above in the HPI of medications that he is to avoid that we need to keep in mind. Nonetheless the patient seems to be doing fairly well in general. I do believe his peripheral edema may be contributing to the nonhealing factor in regard to this ulcer. Wound History Patient presents with 1 open wound that has been present for approximately nov 2018. Patient has been treating wound in the following manner: band-aide, neosporin. Laboratory tests have not been performed in the last month. Patient reportedly has not tested positive for an antibiotic resistant organism. Patient reportedly has not tested positive for osteomyelitis.  Patient reportedly has not had testing performed to evaluate circulation in the legs. Patient experiences the following problems associated with their wounds: swelling. Patient History Information obtained from Patient. Douglas LollHUNTER, Evren L. ([id number]) Allergies penicillin, Omnicef Family History Cancer - Father, Heart Disease - Father, No family history of Diabetes, Hereditary Spherocytosis, Hypertension, Kidney Disease, Lung Disease, Seizures, Stroke, Thyroid Problems, Tuberculosis. Social History Never smoker, Marital Status - Married, Alcohol Use - Never, Drug Use - No History, Caffeine Use - Daily. Medical History Eyes Patient has history of Cataracts - surgery Hematologic/Lymphatic Patient has history of Anemia, Lymphedema Cardiovascular Patient has history of Hypertension Endocrine Patient has history of Type II Diabetes Patient is treated with Insulin, Oral Agents. Blood sugar is tested. Review of Systems (ROS) Constitutional Symptoms (General Health) The patient has no complaints or symptoms. Ear/Nose/Mouth/Throat hearing aides Respiratory The patient has no complaints or symptoms. Gastrointestinal The patient has no complaints or symptoms. Endocrine Complains or has symptoms of Thyroid disease. Genitourinary frequent urination Immunological The patient has no complaints or symptoms. Integumentary (Skin) Complains or has symptoms of Wounds. Musculoskeletal The patient has no complaints or symptoms. Neurologic The patient has no complaints or symptoms. Oncologic The patient has no complaints or symptoms. Psychiatric The patient has no complaints or symptoms. Douglas LollHUNTER, Lijah L. ([id number]) Objective Constitutional patient is hypertensive.. pulse regular and within target  range for patient.Marland Kitchen respirations regular, non-labored and within target range for patient.Marland Kitchen temperature within target range for patient.. Well-nourished and well-hydrated in no acute  distress. Vitals Time Taken: 8:15 AM, Height: 72 in, Weight: 323.9 lbs, Source: Measured, BMI: 43.9, Temperature: 97.7 F, Pulse: 69 bpm, Respiratory Rate: 18 breaths/min, Blood Pressure: 140/62 mmHg. Eyes conjunctiva clear no eyelid edema noted. pupils equal round and reactive to light and accommodation. Ears, Nose, Mouth, and Throat no gross abnormality of ear auricles or external auditory canals. normal hearing noted during conversation. mucus membranes moist. Respiratory normal breathing without difficulty. clear to auscultation bilaterally. Cardiovascular regular rate and rhythm with normal S1, S2. 1+ dorsalis pedis/posterior tibialis pulses. 1+ pitting edema of the bilateral lower extremities. Gastrointestinal (GI) soft, non-tender, non-distended, +BS. no ventral hernia noted. Musculoskeletal normal gait and posture. no significant deformity or arthritic changes, no loss or range of motion, no clubbing. Psychiatric this patient is able to make decisions and demonstrates good insight into disease process. Alert and Oriented x 3. pleasant and cooperative. General Notes: Patient's wound bed actually shows evidence of good granulation tissue at this point there's no evidence of infection which is good news. He has been tolerating the dressing changes at home although these have been minimal just what he could put together for treatment so to speak. This has included triple antibiotic ointment and Vaseline. He's also left this open to air at times. The wound did require some debridement to remove the necrotic slough noted on the surface of the wound. Patient and she tolerated this fairly well today without complication. At least up until the end when I was getting a little bit more deep at the 1-3 o'clock location. I therefore decided to utilize medication to try to help loosen this up as opposed to continuing to "dig" in the area. Integumentary (Hair, Skin) Wound #1 status is Open.  Original cause of wound was Gradually Appeared. The wound is located on the Left,Lateral Lower Leg. The wound measures 1.9cm length x 2.1cm width x 0.1cm depth; 3.134cm^2 area and 0.313cm^3 volume. There is Fat Layer (Subcutaneous Tissue) Exposed exposed. There is no tunneling or undermining noted. There is a large amount of serous drainage noted. The wound margin is flat and intact. There is large (67-100%) red granulation within the wound bed. There is a small (1-33%) amount of necrotic tissue within the wound bed including Adherent Slough. The periwound skin appearance exhibited: Erythema. The periwound skin appearance did not exhibit: Callus, Crepitus, Excoriation, Induration, Rash, Scarring, Dry/Scaly, Maceration, Atrophie Blanche, Cyanosis, Ecchymosis, Hemosiderin Staining, Mottled, Pallor, Rubor. The surrounding wound skin color is noted with erythema which is circumferential. Periwound temperature was noted as No Abnormality. The periwound has tenderness on palpation. [name], [name] ([id number]) Assessment Active Problems ICD-10 Type 2 diabetes mellitus with other skin ulcer Non-pressure chronic ulcer of other part of left lower leg with fat layer exposed Lymphedema, not elsewhere classified Myasthenia gravis without (acute) exacerbation Venous insufficiency (chronic) (peripheral) Peripheral vascular disease, unspecified Procedures Wound #1 Pre-procedure diagnosis of Wound #1 is a Diabetic Wound/Ulcer of the Lower Extremity located on the Left,Lateral Lower Leg .Severity of Tissue Pre Debridement is: Fat layer exposed. There was a Excisional Skin/Subcutaneous Tissue Debridement with a total area of 3.99 sq cm performed by STONE III, Clairessa Boulet E., PA-C. With the following instrument(s): Curette to remove Viable and Non-Viable tissue/material. Material removed includes Subcutaneous Tissue, Slough, and Biofilm after achieving pain control using Other (lidocaine 4%). No specimens were  taken. A time  out was conducted at 08:53, prior to the start of the procedure. A Minimum amount of bleeding was controlled with Pressure. The procedure was tolerated well with a pain level of 0 throughout and a pain level of 0 following the procedure. Patient s Level of Consciousness post procedure was recorded as Awake and Alert. Post Debridement Measurements: 1.9cm length x 2.1cm width x 0.1cm depth; 0.313cm^3 volume. Character of Wound/Ulcer Post Debridement is stable. Severity of Tissue Post Debridement is: Fat layer exposed. Post procedure Diagnosis Wound #1: Same as Pre-Procedure Plan Wound Cleansing: Wound #1 Left,Lateral Lower Leg: Clean wound with Normal Saline. Anesthetic (add to Medication List): Wound #1 Left,Lateral Lower Leg: Topical Lidocaine 4% cream applied to wound bed prior to debridement (In Clinic Only). Skin Barriers/Peri-Wound Care: Wound #1 Left,Lateral Lower Leg: Moisturizing lotion Primary Wound Dressing: Wound #1 Left,Lateral Lower Leg: Iodoflex Secondary Dressing: Wound #1 Left,Lateral Lower Leg: ABD pad Dressing Change Frequency: Wound #1 Left,Lateral Lower Leg: Dressing is to be changed Monday and Thursday. Follow-up Appointments: CRANFORD, BLESSINGER ([id number]) Wound #1 Left,Lateral Lower Leg: Nurse Visit as needed Other: - return thursday for nurse visit Edema Control: Wound #1 Left,Lateral Lower Leg: 3 Layer Compression System - Left Lower Extremity - unna to anchor Compression Pump: Use compression pump on left lower extremity for 30 minutes, twice daily. - work up to 1 hour twice daily Compression Pump: Use compression pump on right lower extremity for 30 minutes, twice daily. - work up to 1 hour twice daily Additional Orders / Instructions: Wound #1 Left,Lateral Lower Leg: Vitamin A; Vitamin C, Zinc I am going to recommend that we initiate the above wound care orders for the next week. Patient is in agreement with this plan. We will see  were things stand at follow-up. Please see above for specific wound care orders. We will see patient for re-evaluation in 1 week(s) here in the clinic. If anything worsens or changes patient will contact our office for additional recommendations. Electronic Signature(s) Signed: 09/10/2017 1:56:08 AM By: Lenda Kelp PA-C Entered By: Lenda Kelp on 09/10/2017 01:54:46 Polka, [name] ([id number]) -------------------------------------------------------------------------------- ROS/PFSH Details Patient Name: [name], YAFFE. Date of Service: 09/09/2017 8:00 AM Medical Record Number: [id number] Patient Account Number: [id number] Date of Birth/Sex: [birth year] (66 y.o. M) Treating RN: Phillis Haggis Primary Care Provider: Leotis Shames Other Clinician: Referring Provider: Leotis Shames Treating Provider/Extender: STONE III, Wyatt Galvan Weeks in Treatment: 0 Information Obtained From Patient Wound History Do you currently have one or more open woundso Yes How many open wounds do you currently haveo 1 Approximately how long have you had your woundso nov 2018 How have you been treating your wound(s) until nowo band-aide, neo sporinHas your wound(s) ever healed and then re-openedo No Have you had any lab work done in the past montho No Have you tested positive for an antibiotic resistant organism (MRSA, VRE)o No Have you tested positive for osteomyelitis (bone infection)o No Have you had any tests for circulation on your legso No Have you had other problems associated with your woundso Swelling Endocrine Complaints and Symptoms: Positive for: Thyroid disease Medical History: Positive for: Type II Diabetes Time with diabetes: 15 yrs Treated with: Insulin, Oral agents Blood sugar tested every day: Yes Tested : Integumentary (Skin) Complaints and Symptoms: Positive for: Wounds Constitutional Symptoms (General Health) Complaints and Symptoms: No Complaints or  Symptoms Eyes Medical History: Positive for: Cataracts - surgery Ear/Nose/Mouth/Throat Complaints and Symptoms: Review of System Notes: hearing aides Hematologic/Lymphatic [name], [name] ([id number])  Medical History: Positive for: Anemia; Lymphedema Respiratory Complaints and Symptoms: No Complaints or Symptoms Cardiovascular Medical History: Positive for: Hypertension Gastrointestinal Complaints and Symptoms: No Complaints or Symptoms Genitourinary Complaints and Symptoms: Review of System Notes: frequent urination Immunological Complaints and Symptoms: No Complaints or Symptoms Musculoskeletal Complaints and Symptoms: No Complaints or Symptoms Neurologic Complaints and Symptoms: No Complaints or Symptoms Oncologic Complaints and Symptoms: No Complaints or Symptoms Psychiatric Complaints and Symptoms: No Complaints or Symptoms HBO Extended History Items Eyes: Cataracts Immunizations Pneumococcal Vaccine: Received Pneumococcal Vaccination: Yes Implantable Devices [name], RUFUS ([id number]) Family and Social History Cancer: Yes - Father; Diabetes: No; Heart Disease: Yes - Father; Hereditary Spherocytosis: No; Hypertension: No; Kidney Disease: No; Lung Disease: No; Seizures: No; Stroke: No; Thyroid Problems: No; Tuberculosis: No; Never smoker; Marital Status - Married; Alcohol Use: Never; Drug Use: No History; Caffeine Use: Daily; Financial Concerns: No; Food, Clothing or Shelter Needs: No; Support System Lacking: No; Transportation Concerns: No; Advanced Directives: No; Patient does not want information on Advanced Directives; Do not resuscitate: No; Living Will: Yes (Copy provided); Medical Power of Attorney: No Electronic Signature(s) Signed: 09/09/2017 5:21:58 PM By: Alejandro Mulling Signed: 09/10/2017 1:56:08 AM By: Lenda Kelp PA-C Entered By: Alejandro Mulling on 09/09/2017 08:26:37 JACOBI, NILE  ([id number]) -------------------------------------------------------------------------------- SuperBill Details Patient Name: CORDARRO, SPINNATO. Date of Service: 09/09/2017 Medical Record Number: [id number] Patient Account Number: [id number] Date of Birth/Sex: 1951-02-20 (66 y.o. M) Treating RN: Renne Crigler Primary Care Provider: Leotis Shames Other Clinician: Referring Provider: Leotis Shames Treating Provider/Extender: STONE III, Cory Rama Weeks in Treatment: 0 Diagnosis Coding ICD-10 Codes Code Description E11.622 Type 2 diabetes mellitus with other skin ulcer 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 of other part of left lower leg with fat layer exposed I89.0 Lymphedema, not elsewhere classified G70.00 Myasthenia gravis without (acute) exacerbation I87.2 Venous insufficiency (chronic) (peripheral) I73.9 Peripheral vascular disease, unspecified Facility Procedures CPT4 Code Description: 08657846 99213 - WOUND CARE VISIT-LEV 3 EST PT Modifier: Quantity: 1 CPT4 Code Description: 96295284 11042 - DEB SUBQ TISSUE 20 SQ CM/< ICD-10 Diagnosis Description 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 of other part of left lower leg with Modifier: fat layer expos Quantity: 1 ed Physician Procedures CPT4 Code Description: 1324401 02725 - WC PHYS LEVEL 4 - NEW PT ICD-10 Diagnosis Description E11.622 Type 2 diabetes mellitus with other skin ulcer 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 of other part of left lower leg with I89.0 Lymphedema, not elsewhere  classified G70.00 Myasthenia gravis without (acute) exacerbation Modifier: 25 fat layer expos Quantity: 1 ed CPT4 Code Description: 3664403 11042 - WC PHYS SUBQ TISS 20 SQ CM ICD-10 Diagnosis Description 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

## 2017-09-11 NOTE — Progress Notes (Signed)
[name], Amy L. ([id number]) Visit Report for 09/09/2017 Abuse/Suicide Risk Screen Details Patient Name: [name], Douglas L. Date of Service: 09/09/2017 8:00 AM Medical Record Number: [id number] Patient Account Number: [id number] Date of Birth/Sex: 08-Apr-1950 (66 y.o. M) Treating RN: [name], Debi Primary Care [name]: Douglas ShamesSINGH, [name] Other Clinician: Referring [name]: Douglas ShamesSINGH, [name] Treating [name]/Extender: [name], [name] [name] in Treatment: 0 Abuse/Suicide Risk Screen Items Answer ABUSE/SUICIDE RISK SCREEN: Has anyone close to you tried to hurt or harm you recentlyo No Do you feel uncomfortable with anyone in your familyo No Has anyone forced you do things that you didnot want to doo No Do you have any thoughts of harming yourselfo No Patient displays signs or symptoms of abuse and/or neglect. No Electronic Signature(s) Signed: 09/09/2017 5:21:58 PM By: Douglas MullingPinkerton, [name] Entered By: Douglas MullingPinkerton, [name] on 09/09/2017 08:26:43 [name], Douglas L. ([id number]) -------------------------------------------------------------------------------- Activities of Daily Living Details Patient Name: [name], Douglas L. Date of Service: 09/09/2017 8:00 AM Medical Record Number: [id number] Patient Account Number: [id number] Date of Birth/Sex: 08-Apr-1950 (66 y.o. M) Treating RN: [name], Debi Primary Care Audrena Talaga: Douglas ShamesSINGH, [name] Other Clinician: Referring Lamiracle Chaidez: Douglas ShamesSINGH, [name] Treating Teresea Donley/Extender: [name], [name] [name] in Treatment: 0 Activities of Daily Living Items Answer Activities of Daily Living (Please select one for each item) Drive Automobile Completely Able Take Medications Completely Able Use Telephone Completely Able Care for Appearance Completely Able Use Toilet Completely Able Bath / Shower Completely Able Dress Self Completely Able Feed Self Completely Able Walk Completely Able Get In / Out Bed Completely Able Housework Completely Able Prepare Meals Completely  Able Handle Money Completely Able Shop for Self Completely Able Electronic Signature(s) Signed: 09/09/2017 5:21:58 PM By: Douglas MullingPinkerton, [name] Entered By: Douglas MullingPinkerton, [name] on 09/09/2017 08:27:04 [name], Douglas L. ([id number]) -------------------------------------------------------------------------------- Education Assessment Details Patient Name: [name], Douglas L. Date of Service: 09/09/2017 8:00 AM Medical Record Number: [id number] Patient Account Number: [id number] Date of Birth/Sex: 08-Apr-1950 (66 y.o. M) Treating RN: [name], Debi Primary Care Angelo Prindle: Douglas ShamesSINGH, [name] Other Clinician: Referring Lakoda Mcanany: Douglas ShamesSINGH, [name] Treating [name]/Extender: Douglas DibblesSTONE Wright, [name] [name] in Treatment: 0 Primary Learner Assessed: Patient Learning Preferences/Education Level/Primary Language Learning Preference: Explanation, Printed Material Highest Education Level: College or Above Preferred Language: English Cognitive Barrier Assessment/Beliefs Language Barrier: No Translator Needed: No Memory Deficit: No Emotional Barrier: No Cultural/Religious Beliefs Affecting Medical Care: No Physical Barrier Assessment Impaired Vision: No Impaired Hearing: Yes Hearing Aid Decreased Hand dexterity: No Knowledge/Comprehension Assessment Knowledge Level: Medium Comprehension Level: Medium Ability to understand written Medium instructions: Ability to understand verbal Medium instructions: Motivation Assessment Anxiety Level: Calm Cooperation: Cooperative Education Importance: Acknowledges Need Interest in Health Problems: Asks Questions Perception: Coherent Willingness to Engage in Self- Medium Management Activities: Readiness to Engage in Self- Medium Management Activities: Electronic Signature(s) Signed: 09/09/2017 5:21:58 PM By: Douglas MullingPinkerton, [name] Entered By: Douglas MullingPinkerton, [name] on 09/09/2017 08:27:33 [name], Douglas L.  ([id number]) -------------------------------------------------------------------------------- Fall Risk Assessment Details Patient Name: [name], Douglas L. Date of Service: 09/09/2017 8:00 AM Medical Record Number: [id number] Patient Account Number: [id number] Date of Birth/Sex: 08-Apr-1950 (66 y.o. M) Treating RN: [name], Debi Primary Care Vonita Calloway: Douglas ShamesSINGH, [name] Other Clinician: Referring Koralyn Prestage: Douglas ShamesSINGH, [name] Treating Michall Noffke/Extender: [name], [name] [name] in Treatment: 0 Fall Risk Assessment Items Have you had 2 or more falls in the last 12 monthso 0 No Have you had any fall that resulted in injury in the last 12 monthso 0 No FALL RISK ASSESSMENT: History of falling - immediate or within 3 months 0 No Secondary diagnosis 0 No Ambulatory aid None/bed rest/wheelchair/nurse 0 No Crutches/cane/walker  0 No Furniture 0 No IV Access/Saline Lock 0 No Gait/Training Normal/bed rest/immobile 0 No Weak 0 No Impaired 0 No Mental Status Oriented to own ability 0 Yes Electronic Signature(s) Signed: 09/09/2017 5:21:58 PM By: [name] Entered By: [name] on 09/09/2017 08:27:45 [name] ([id number]) -------------------------------------------------------------------------------- Foot Assessment Details Patient Name: [name], [name] Date of Service: 09/09/2017 8:00 AM Medical Record Number: [id number] Patient Account Number: [id number] Date of Birth/Sex: Jan 15, 1951 (66 y.o. M) Treating RN: Douglas Haggis Primary Care Sharyon Peitz: [name] Other Clinician: Referring Marti Mclane: [name] Treating Ariyan Sinnett/Extender: [name], [name] [name] in Treatment: 0 Foot Assessment Items Site Locations + = Sensation present, - = Sensation absent, C = Callus, U = Ulcer R = Redness, W = Warmth, M = Maceration, PU = Pre-ulcerative lesion F = Fissure, S = Swelling, D = Dryness Assessment Right: Left: Other Deformity: No No Prior Foot Ulcer: No No Prior  Amputation: No No Charcot Joint: No No Ambulatory Status: Ambulatory Without Help Gait: Steady Electronic Signature(s) Signed: 09/09/2017 5:21:58 PM By: [name] Entered By: [name] on 09/09/2017 08:29:02 Douglas Loll ([id number]) -------------------------------------------------------------------------------- Nutrition Risk Assessment Details Patient Name: Douglas Loll. Date of Service: 09/09/2017 8:00 AM Medical Record Number: [id number] Patient Account Number: [id number] Date of Birth/Sex: 1951/02/22 (66 y.o. M) Treating RN: Douglas Haggis Primary Care Alira Fretwell: [name] Other Clinician: Referring Shantese Raven: [name] Treating Klea Nall/Extender: [name], [name] [name] in Treatment: 0 Height (in): 72 Weight (lbs): 323.9 Body Mass Index (BMI): 43.9 Nutrition Risk Assessment Items NUTRITION RISK SCREEN: I have an illness or condition that made me change the kind and/or amount of 0 No food I eat I eat fewer than two meals per day 0 No I eat few fruits and vegetables, or milk products 0 No I have three or more drinks of beer, liquor or wine almost every day 0 No I have tooth or mouth problems that make it hard for me to eat 0 No I don't always have enough money to buy the food I need 0 No I eat alone most of the time 0 No I take three or more different prescribed or over-the-counter drugs a day 1 Yes Without wanting to, I have lost or gained 10 pounds in the last six months 0 No I am not always physically able to shop, cook and/or feed myself 0 No Nutrition Protocols Good Risk Protocol Moderate Risk Protocol Electronic Signature(s) Signed: 09/09/2017 5:21:58 PM By: [name] Entered By: [name] on 09/09/2017 08:27:52

## 2017-09-12 DIAGNOSIS — E11622 Type 2 diabetes mellitus with other skin ulcer: Secondary | ICD-10-CM | POA: Diagnosis not present

## 2017-09-14 NOTE — Progress Notes (Signed)
Douglas, Wright ([id number]) Visit Report for 09/12/2017 Arrival Information Details Patient Name: [name], [name]. Date of Service: 09/12/2017 2:30 PM Medical Record Number: [id number] Patient Account Number: [id number] Date of Birth/Sex: 06/11/1950 (66 y.o. M) Treating RN: Renne Crigler Primary Care Yarely Bebee: Leotis Shames Other Clinician: Referring Misbah Hornaday: Leotis Shames Treating Ebenezer Mccaskey/Extender: Kathreen Cosier in Treatment: 0 Visit Information History Since Last Visit All ordered tests and consults were completed: No Patient Arrived: Cane Added or deleted any medications: No Arrival Time: 14:53 Any new allergies or adverse reactions: No Accompanied By: self Had a fall or experienced change in No Transfer Assistance: None activities of daily living that may affect Patient Identification Verified: Yes risk of falls: Secondary Verification Process Completed: Yes Signs or symptoms of abuse/neglect since last visito No Patient Requires Transmission-Based Precautions: No Hospitalized since last visit: No Patient Has Alerts: Yes Implantable device outside of the clinic excluding No Patient Alerts: DM II cellular tissue based products placed in the center since last visit: Pain Present Now: No Electronic Signature(s) Signed: 09/12/2017 4:15:57 PM By: Renne Crigler Entered By: Renne Crigler on 09/12/2017 14:53:42 Wright, [name] ([id number]) -------------------------------------------------------------------------------- Compression Therapy Details Patient Name: [name], [name]. Date of Service: 09/12/2017 2:30 PM Medical Record Number: [id number] Patient Account Number: [id number] Date of Birth/Sex: 24-Nov-1950 (66 y.o. M) Treating RN: Renne Crigler Primary Care Neri Samek: Leotis Shames Other Clinician: Referring Kalub Morillo: Leotis Shames Treating Peirce Deveney/Extender: Kathreen Cosier in Treatment: 0 Compression Therapy Performed for Wound  Assessment: Wound #1 Left,Lateral Lower Leg Performed By: Clinician Renne Crigler, RN Compression Type: Three Layer Pre Treatment ABI: 1.5 Electronic Signature(s) Signed: 09/12/2017 4:15:57 PM By: Renne Crigler Entered By: Renne Crigler on 09/12/2017 15:00:29 Douglas, Wright ([id number]) -------------------------------------------------------------------------------- Encounter Discharge Information Details Patient Name: [name], [name]. Date of Service: 09/12/2017 2:30 PM Medical Record Number: [id number] Patient Account Number: [id number] Date of Birth/Sex: [birth year] (66 y.o. M) Treating RN: Renne Crigler Primary Care Malikah Principato: Leotis Shames Other Clinician: Referring Abdirahman Chittum: Leotis Shames Treating Carie Kapuscinski/Extender: Kathreen Cosier in Treatment: 0 Encounter Discharge Information Items Discharge Condition: Stable Ambulatory Status: Cane Discharge Destination: Home Transportation: Private Auto Schedule Follow-up Appointment: Yes Clinical Summary of Care: Electronic Signature(s) Signed: 09/12/2017 4:15:57 PM By: Renne Crigler Entered By: Renne Crigler on 09/12/2017 15:11:40 [name] ([id number]) -------------------------------------------------------------------------------- Patient/Caregiver Education Details Patient Name: [name], [name]. Date of Service: 09/12/2017 2:30 PM Medical Record Number: [id number] Patient Account Number: [id number] Date of Birth/Gender: 08/26/1950 (67 y.o. M) Treating RN: Renne Crigler Primary Care Physician: Leotis Shames Other Clinician: Referring Physician: Leotis Shames Treating Physician/Extender: Kathreen Cosier in Treatment: 0 Education Assessment Education Provided To: Patient Education Topics Provided Wound/Skin Impairment: Methods: Explain/Verbal Responses: State content correctly Electronic Signature(s) Signed: 09/12/2017 4:15:57 PM By: Renne Crigler Entered By: Renne Crigler on  09/12/2017 15:11:27 [name] ([id number]) -------------------------------------------------------------------------------- Wound Assessment Details Patient Name: [name], [name]. Date of Service: 09/12/2017 2:30 PM Medical Record Number: [id number] Patient Account Number: [id number] Date of Birth/Sex: [birth year] (66 y.o. M) Treating RN: Renne Crigler Primary Care Fredia Chittenden: Leotis Shames Other Clinician: Referring Kellen Hover: Leotis Shames Treating Tonya Carlile/Extender: Kathreen Cosier in Treatment: 0 Wound Status Wound Number: 1 Primary Diabetic Wound/Ulcer of the Lower Extremity Etiology: Wound Location: Left Lower Leg - Lateral Wound Open Wounding Event: Gradually Appeared Status: Date Acquired: 08/26/2017 Comorbid Cataracts, Anemia, Lymphedema, Weeks Of Treatment: 0 History: Hypertension, Type II Diabetes Clustered Wound: No Photos Photo Uploaded By: Renne Crigler on 09/12/2017 16:24:14 Wound Measurements Length: (cm) 1.9 % Reduct Width: (  cm) 2 % Reduct Depth: (cm) 0.2 Epitheli Area: (cm) 2.985 Tunneli Volume: (cm) 0.597 Undermi ion in Area: 4.8% ion in Volume: -90.7% alization: None ng: No ning: No Wound Description Classification: Grade 1 Wound Margin: Flat and Intact Exudate Amount: Large Exudate Type: Serous Exudate Color: amber Foul Odor After Cleansing: No Slough/Fibrino Yes Wound Bed Granulation Amount: Large (67-100%) Exposed Structure Granulation Quality: Red Fascia Exposed: No Necrotic Amount: Small (1-33%) Fat Layer (Subcutaneous Tissue) Exposed: Yes Necrotic Quality: Adherent Slough Tendon Exposed: No Muscle Exposed: No Joint Exposed: No Bone Exposed: No Periwound Skin Texture [name], [name]. ([id number]) Texture Color No Abnormalities Noted: No No Abnormalities Noted: No Callus: No Atrophie Blanche: No Crepitus: No Cyanosis: No Excoriation: No Ecchymosis: No Induration: No Erythema: Yes Rash: No Erythema  Location: Circumferential Scarring: No Hemosiderin Staining: No Mottled: No Moisture Pallor: No No Abnormalities Noted: No Rubor: No Dry / Scaly: No Maceration: No Temperature / Pain Temperature: No Abnormality Tenderness on Palpation: Yes Wound Preparation Ulcer Cleansing: Rinsed/Irrigated with Saline Topical Anesthetic Applied: None Treatment Notes Wound #1 (Left, Lateral Lower Leg) 1. Cleansed with: Cleanse wound with antibacterial soap and water 3. Peri-wound Care: Moisturizing lotion 4. Dressing Applied: Iodoflex 5. Secondary Dressing Applied ABD Pad 7. Secured with 3 Layer Compression System - Left Lower Extremity Electronic Signature(s) Signed: 09/12/2017 4:15:57 PM By: Renne CriglerFlinchum, Cheryl Entered By: Renne CriglerFlinchum, Cheryl on 09/12/2017 15:00:06

## 2017-09-16 ENCOUNTER — Encounter: Payer: BLUE CROSS/BLUE SHIELD | Admitting: Physician Assistant

## 2017-09-16 DIAGNOSIS — E11622 Type 2 diabetes mellitus with other skin ulcer: Secondary | ICD-10-CM | POA: Diagnosis not present

## 2017-09-17 NOTE — Progress Notes (Signed)
TRACEN, MAHLER ([id number]) Visit Report for 09/16/2017 Chief Complaint Document Details Patient Name: [name], [name]. Date of Service: 09/16/2017 8:30 AM Medical Record Number: [id number] Patient Account Number: [id number] Date of Birth/Sex: 1950-11-03 (67 y.o. M) Treating RN: Renne Crigler Primary Care Provider: Leotis Shames Other Clinician: Referring Provider: Leotis Shames Treating Provider/Extender: Linwood Dibbles, HOYT Weeks in Treatment: 1 Information Obtained from: Patient Chief Complaint Left leg ulcer Electronic Signature(s) Signed: 09/16/2017 11:25:57 PM By: Lenda Kelp PA-C Entered By: Lenda Kelp on 09/16/2017 09:04:47 Mullin, [name] ([id number]) -------------------------------------------------------------------------------- Debridement Details Patient Name: [name], Douglas L. Date of Service: 09/16/2017 8:30 AM Medical Record Number: [id number] Patient Account Number: [id number] Date of Birth/Sex: 1950/06/01 (67 y.o. M) Treating RN: Renne Crigler Primary Care Provider: Leotis Shames Other Clinician: Referring Provider: Leotis Shames Treating Provider/Extender: STONE III, HOYT Weeks in Treatment: 1 Debridement Performed for Wound #1 Left,Lateral Lower Leg Assessment: Performed By: Physician STONE III, HOYT E., PA-C Debridement Type: Debridement Severity of Tissue Pre Fat layer exposed Debridement: Pre-procedure Verification/Time Yes - 09:12 Out Taken: Start Time: 09:12 Pain Control: Other : lidocaine 4% Total Area Debrided (L x W): 2.1 (cm) x 2.3 (cm) = 4.83 (cm) Tissue and other material Viable, Non-Viable, Slough, Subcutaneous, Biofilm, Slough debrided: Level: Skin/Subcutaneous Tissue Debridement Description: Excisional Instrument: Curette Bleeding: Minimum Hemostasis Achieved: Pressure End Time: 09:13 Procedural Pain: 0 Post Procedural Pain: 0 Response to Treatment: Procedure was tolerated well Level of Consciousness: Awake  and Alert Post Procedure Vitals: Temperature: 97.8 Pulse: 64 Respiratory Rate: 16 Blood Pressure: Systolic Blood Pressure: 156 Diastolic Blood Pressure: 70 Post Debridement Measurements of Total Wound Length: (cm) 2.1 Width: (cm) 2.3 Depth: (cm) 0.3 Volume: (cm) 1.138 Character of Wound/Ulcer Post Debridement: Stable Severity of Tissue Post Debridement: Fat layer exposed Post Procedure Diagnosis Same as Pre-procedure Electronic Signature(s) Signed: 09/16/2017 4:53:36 PM By: Renne Crigler Signed: 09/16/2017 11:25:57 PM By: Marland Kitchen, [place] ([id number]) Entered By: Renne Crigler on 09/16/2017 09:13:38 [name], [name] ([id number]) -------------------------------------------------------------------------------- HPI Details Patient Name: ANDRUE, DINI L. Date of Service: 09/16/2017 8:30 AM Medical Record Number: [id number] Patient Account Number: [id number] Date of Birth/Sex: [birth year] (67 y.o. M) Treating RN: Renne Crigler Primary Care Provider: Leotis Shames Other Clinician: Referring Provider: Leotis Shames Treating Provider/Extender: STONE III, HOYT Weeks in Treatment: 1 History of Present Illness HPI Description: MYASTHENIA GRAVIS (MG) MEDICATION ALERT: Medications that increase weakness in most MG patients should be avoided or used only with great caution. These include: - Fluoroquinolone antibiotics: moxifloxacin (Aveloxo), ciprofloxacin (Ciproo, Proquin XRo), norfloxacin (Noroxino), levofloxacin (Levaquino), ofloxacin (Floxino), gemifloxacin (Factiveo) - Aminoglycoside antibiotics: tobramycin, gentamycin, kanamycin, neomycin, streptomycin - Macrolide antibiotics: erythromycin, azithromycin (Z-Pako, Zithromaxo), telithromycin (Keteko) - Botulinum toxins: Botoxo, Myobloco, Dysporto, Xeomino - Beta-blockers such as propanolol or timolol eyedrops (Timoptico) - Calcium channel blockers (blood pressure medications) - Quinine, quinidine or  procainamide - D-penicillamine (do not confuse with penicillin) - Interferon - Magnesium salts: milk of magnesia, some antacids (Maaloxo, Mylantao) - Curare and related drugs (usually used only during surgery) Other medications may produce problems in some MG patients. Be sure your doctor/dentist knows you have MG when any new medication is prescribed. Contact your myasthenia doctor if you or your local doctor has questions about medications. 09/09/17 on evaluation today patient actually appears for initial evaluation today concerning his left lateral lower extremity ulcer which has been present he tells me since November 2018. He states that gas logs that he'd forgotten he had said somewhere actually fell over landing  on his leg causing the injury and unfortunately the left leg has never fully recovered since that time. He does note that there was some improvement initially and now it's pretty much stagnant. He does have venous stasis chronically although he states it's been worse and pass compared to current. He has also been diagnosed with lymphedema although again this is something else that has been deemed the worst in the past compared to present. He does have lymphedema pumps although he really has not been using those on a regular basis. Patient does have diabetes mellitus type II in other than the venous insufficiency and prayerful vascular disease he also has myasthenia gravis board she is on medications long-term. Currently he has been tolerating the medications for this very well he does have a list as noted above in the HPI of medications that he is to avoid that we need to keep in mind. Nonetheless the patient seems to be doing fairly well in [name]. I do believe his peripheral edema may be contributing to the nonhealing factor in regard to this ulcer. 09/16/17 on evaluation today patient appears to be doing rather well his wound actually appears to show signs of improvement compared  to previous evaluation which is great news. Overall his wound is a little larger but again I did debride the wound sharply last week so this is definitely expected as far as I was concerned. Nonetheless the wound bed itself seems to be doing excellent. He is having no discomfort normally he does have a little bit of pain with cleansing of the wound by myself today. Electronic Signature(s) Signed: 09/16/2017 11:25:57 PM By: Lenda Kelp PA-C Entered By: Lenda Kelp on 09/16/2017 10:29:55 [name], [name] ([id number]) -------------------------------------------------------------------------------- Physical Exam Details Patient Name: DESTON, BILYEU. Date of Service: 09/16/2017 8:30 AM Medical Record Number: [id number] Patient Account Number: [id number] Date of Birth/Sex: 1950/06/14 (67 y.o. M) Treating RN: Renne Crigler Primary Care Provider: Leotis Shames Other Clinician: Referring Provider: Leotis Shames Treating Provider/Extender: STONE III, HOYT Weeks in Treatment: 1 Constitutional Well-nourished and well-hydrated in no acute distress. Respiratory normal breathing without difficulty. Psychiatric this patient is able to make decisions and demonstrates good insight into disease process. Alert and Oriented x 3. pleasant and cooperative. Notes On inspection today patient's wound bed actually shows signs of good granulation which is excellent news. Overall he has been tolerating the dressing changes without complication. The wrap also has seem to do very well for him at this point. Following sharp debridement today patient's wound bed actually shows signs of being even better than upon presentation. Overall I'm very happy with how things are progressing. Electronic Signature(s) Signed: 09/16/2017 11:25:57 PM By: Lenda Kelp PA-C Entered By: Lenda Kelp on 09/16/2017 10:31:04 [name]  ([id number]) -------------------------------------------------------------------------------- Physician Orders Details Patient Name: TRAVERS, GOODLEY. Date of Service: 09/16/2017 8:30 AM Medical Record Number: [id number] Patient Account Number: [id number] Date of Birth/Sex: 02-18-1951 (67 y.o. M) Treating RN: Renne Crigler Primary Care Provider: Leotis Shames Other Clinician: Referring Provider: Leotis Shames Treating Provider/Extender: STONE III, HOYT Weeks in Treatment: 1 Verbal / Phone Orders: No Diagnosis Coding Wound Cleansing Wound #1 Left,Lateral Lower Leg o Clean wound with Normal Saline. Anesthetic (add to Medication List) Wound #1 Left,Lateral Lower Leg o Topical Lidocaine 4% cream applied to wound bed prior to debridement (In Clinic Only). Skin Barriers/Peri-Wound Care Wound #1 Left,Lateral Lower Leg o Moisturizing lotion Primary Wound Dressing Wound #1 Left,Lateral Lower Leg o Iodoflex Secondary Dressing  Wound #1 Left,Lateral Lower Leg o ABD pad Dressing Change Frequency Wound #1 Left,Lateral Lower Leg o Dressing is to be changed Monday and Thursday. Follow-up Appointments Wound #1 Left,Lateral Lower Leg o Nurse Visit as needed o Other: - return thursday for nurse visit Edema Control Wound #1 Left,Lateral Lower Leg o 3 Layer Compression System - Left Lower Extremity - unna to anchor o Compression Pump: Use compression pump on left lower extremity for 30 minutes, twice daily. - work up to 1 hour twice daily o Compression Pump: Use compression pump on right lower extremity for 30 minutes, twice daily. - work up to 1 hour twice daily Additional Orders / Instructions Wound #1 Left,Lateral Lower Leg o Vitamin A; Vitamin C, Zinc Pruitt, Susano L. ([id number]) Notes unna to anchor Electronic Signature(s) Signed: 09/16/2017 4:53:36 PM By: Renne CriglerFlinchum, Cheryl Signed: 09/16/2017 11:25:57 PM By: Lenda KelpStone III, Hoyt PA-C Entered By: Renne CriglerFlinchum,  Cheryl on 09/16/2017 09:15:38 Mallo, Douglas LangoICHARD L. ([id number]) -------------------------------------------------------------------------------- Problem List Details Patient Name: [name], Douglas L. Date of Service: 09/16/2017 8:30 AM Medical Record Number: [id number] Patient Account Number: [id number] Date of Birth/Sex: 24-Feb-1951 (67 y.o. M) Treating RN: Renne CriglerFlinchum, Cheryl Primary Care Provider: Leotis ShamesSINGH, JASMINE Other Clinician: Referring Provider: Leotis ShamesSINGH, JASMINE Treating Provider/Extender: Linwood DibblesSTONE III, HOYT Weeks in Treatment: 1 Active Problems ICD-10 Evaluated Encounter Code Description Active Date Today Diagnosis E11.622 Type 2 diabetes mellitus with other skin ulcer 09/09/2017 No Yes 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 of other part of left lower leg with 09/09/2017 No Yes fat layer exposed I89.0 Lymphedema, not elsewhere classified 09/09/2017 No Yes G70.00 Myasthenia gravis without (acute) exacerbation 09/09/2017 No Yes I87.2 Venous insufficiency (chronic) (peripheral) 09/09/2017 No Yes I73.9 Peripheral vascular disease, unspecified 09/09/2017 No Yes Inactive Problems Resolved Problems Electronic Signature(s) Signed: 09/16/2017 11:25:57 PM By: Lenda KelpStone III, Hoyt PA-C Entered By: Lenda KelpStone III, Hoyt on 09/16/2017 09:04:40 [name], Douglas LangoICHARD L. ([id number]) -------------------------------------------------------------------------------- Progress Note Details Patient Name: Douglas LollHUNTER, Daniele L. Date of Service: 09/16/2017 8:30 AM Medical Record Number: [id number] Patient Account Number: [id number] Date of Birth/Sex: 24-Feb-1951 (67 y.o. M) Treating RN: Renne CriglerFlinchum, Cheryl Primary Care Provider: Leotis ShamesSINGH, JASMINE Other Clinician: Referring Provider: Leotis ShamesSINGH, JASMINE Treating Provider/Extender: STONE III, HOYT Weeks in Treatment: 1 Subjective Chief Complaint Information obtained from Patient Left leg ulcer History of Present Illness (HPI) MYASTHENIA GRAVIS (MG) MEDICATION ALERT: Medications that increase  weakness in most MG patients should be avoided or used only with great caution. These include: - Fluoroquinolone antibiotics: moxifloxacin (Avelox), ciprofloxacin (Cipro, Proquin XR), norfloxacin (Noroxin), levofloxacin (Levaquin), ofloxacin (Floxin), gemifloxacin (Factive) - Aminoglycoside antibiotics: tobramycin, gentamycin, kanamycin, neomycin, streptomycin - Macrolide antibiotics: erythromycin, azithromycin (Z-Pak, Zithromax), telithromycin (Ketek) - Botulinum toxins: Botox, Myobloc, Dysport, Xeomin - Beta-blockers such as propanolol or timolol eyedrops (Timoptic) - Calcium channel blockers (blood pressure medications) - Quinine, quinidine or procainamide - D-penicillamine (do not confuse with penicillin) - Interferon - Magnesium salts: milk of magnesia, some antacids (Maalox, Mylanta) - Curare and related drugs (usually used only during surgery) Other medications may produce problems in some MG patients. Be sure your doctor/dentist knows you have MG when any new medication is prescribed. Contact your myasthenia doctor if you or your local doctor has questions about medications. 09/09/17 on evaluation today patient actually appears for initial evaluation today concerning his left lateral lower extremity ulcer which has been present he tells me since November 2018. He states that gas logs that he'd forgotten he had said somewhere actually fell over landing on his leg causing the injury and unfortunately the left leg has never fully recovered  since that time. He does note that there was some improvement initially and now it's pretty much stagnant. He does have venous stasis chronically although he states it's been worse and pass compared to current. He has also been diagnosed with lymphedema although again this is something else that has been deemed the worst in the past compared to present. He does have lymphedema pumps although he really has not been using  those on a regular basis. Patient does have diabetes mellitus type II in other than the venous insufficiency and prayerful vascular disease he also has myasthenia gravis board she is on medications long-term. Currently he has been tolerating the medications for this very well he does have a list as noted above in the HPI of medications that he is to avoid that we need to keep in mind. Nonetheless the patient seems to be doing fairly well in [name]. I do believe his peripheral edema may be contributing to the nonhealing factor in regard to this ulcer. 09/16/17 on evaluation today patient appears to be doing rather well his wound actually appears to show signs of improvement compared to previous evaluation which is great news. Overall his wound is a little larger but again I did debride the wound sharply last week so this is definitely expected as far as I was concerned. Nonetheless the wound bed itself seems to be doing excellent. He is having no discomfort normally he does have a little bit of pain with cleansing of the wound by myself today. Patient History Information obtained from Patient. BORUCH, MANUELE ([id number]) Family History Cancer - Father, Heart Disease - Father, No family history of Diabetes, Hereditary Spherocytosis, Hypertension, Kidney Disease, Lung Disease, Seizures, Stroke, Thyroid Problems, Tuberculosis. Social History Never smoker, Marital Status - Married, Alcohol Use - Never, Drug Use - No History, Caffeine Use - Daily. Review of Systems (ROS) Constitutional Symptoms ([name]) Denies complaints or symptoms of Fever, Chills. Respiratory The patient has no complaints or symptoms. Cardiovascular The patient has no complaints or symptoms. Psychiatric The patient has no complaints or symptoms. Objective Constitutional Well-nourished and well-hydrated in no acute distress. Vitals Time Taken: 8:35 AM, Height: 72 in, Weight: 323.9 lbs, BMI: 43.9, Temperature:  98.1 F, Pulse: 64 bpm, Respiratory Rate: 16 breaths/min, Blood Pressure: 156/70 mmHg. Respiratory normal breathing without difficulty. Psychiatric this patient is able to make decisions and demonstrates good insight into disease process. Alert and Oriented x 3. pleasant and cooperative. [name] Notes: On inspection today patient's wound bed actually shows signs of good granulation which is excellent news. Overall he has been tolerating the dressing changes without complication. The wrap also has seem to do very well for him at this point. Following sharp debridement today patient's wound bed actually shows signs of being even better than upon presentation. Overall I'm very happy with how things are progressing. Integumentary (Hair, Skin) Wound #1 status is Open. Original cause of wound was Gradually Appeared. The wound is located on the Left,Lateral Lower Leg. The wound measures 2.1cm length x 2.3cm width x 0.2cm depth; 3.793cm^2 area and 0.759cm^3 volume. There is Fat Layer (Subcutaneous Tissue) Exposed exposed. There is no tunneling or undermining noted. There is a large amount of serous drainage noted. The wound margin is flat and intact. There is medium (34-66%) red granulation within the wound bed. There is a medium (34-66%) amount of necrotic tissue within the wound bed including Adherent Slough. The periwound skin appearance exhibited: Erythema. The periwound skin appearance did not exhibit: Callus, Crepitus,  Excoriation, Induration, Rash, Scarring, Dry/Scaly, Maceration, Atrophie Blanche, Cyanosis, Ecchymosis, Hemosiderin Staining, Mottled, Pallor, Rubor. The surrounding wound skin color is noted with erythema which is circumferential. Periwound temperature was noted as No Abnormality. The periwound has tenderness on palpation. [name], [name] ([id number]) Assessment Active Problems ICD-10 Type 2 diabetes mellitus with other skin ulcer Non-pressure chronic ulcer of other part of  left lower leg with fat layer exposed Lymphedema, not elsewhere classified Myasthenia gravis without (acute) exacerbation Venous insufficiency (chronic) (peripheral) Peripheral vascular disease, unspecified Procedures Wound #1 Pre-procedure diagnosis of Wound #1 is a Diabetic Wound/Ulcer of the Lower Extremity located on the Left,Lateral Lower Leg .Severity of Tissue Pre Debridement is: Fat layer exposed. There was a Excisional Skin/Subcutaneous Tissue Debridement with a total area of 4.83 sq cm performed by STONE III, HOYT E., PA-C. With the following instrument(s): Curette to remove Viable and Non-Viable tissue/material. Material removed includes Subcutaneous Tissue, Slough, and Biofilm after achieving pain control using Other (lidocaine 4%). No specimens were taken. A time out was conducted at 09:12, prior to the start of the procedure. A Minimum amount of bleeding was controlled with Pressure. The procedure was tolerated well with a pain level of 0 throughout and a pain level of 0 following the procedure. Patient s Level of Consciousness post procedure was recorded as Awake and Alert. Post Debridement Measurements: 2.1cm length x 2.3cm width x 0.3cm depth; 1.138cm^3 volume. Character of Wound/Ulcer Post Debridement is stable. Severity of Tissue Post Debridement is: Fat layer exposed. Post procedure Diagnosis Wound #1: Same as Pre-Procedure Plan Wound Cleansing: Wound #1 Left,Lateral Lower Leg: Clean wound with Normal Saline. Anesthetic (add to Medication List): Wound #1 Left,Lateral Lower Leg: Topical Lidocaine 4% cream applied to wound bed prior to debridement (In Clinic Only). Skin Barriers/Peri-Wound Care: Wound #1 Left,Lateral Lower Leg: Moisturizing lotion Primary Wound Dressing: Wound #1 Left,Lateral Lower Leg: Iodoflex Secondary Dressing: Wound #1 Left,Lateral Lower Leg: ABD pad Dressing Change Frequency: Wound #1 Left,Lateral Lower Leg: PEARLY, APACHITO.  ([id number]) Dressing is to be changed Monday and Thursday. Follow-up Appointments: Wound #1 Left,Lateral Lower Leg: Nurse Visit as needed Other: - return thursday for nurse visit Edema Control: Wound #1 Left,Lateral Lower Leg: 3 Layer Compression System - Left Lower Extremity - unna to anchor Compression Pump: Use compression pump on left lower extremity for 30 minutes, twice daily. - work up to 1 hour twice daily Compression Pump: Use compression pump on right lower extremity for 30 minutes, twice daily. - work up to 1 hour twice daily Additional Orders / Instructions: Wound #1 Left,Lateral Lower Leg: Vitamin A; Vitamin C, Zinc [name] Notes: unna to anchor I'm gonna suggest that we continue with the Current wound care measures for the next week. Patient is in agreement with the plan. We will subsequently see were things stand at follow-up. Please see above for specific wound care orders. We will see patient for re-evaluation in 1 week(s) here in the clinic. If anything worsens or changes patient will contact our office for additional recommendations. Electronic Signature(s) Signed: 09/16/2017 11:25:57 PM By: Lenda Kelp PA-C Entered By: Lenda Kelp on 09/16/2017 10:32:46 [name], [name] ([id number]) -------------------------------------------------------------------------------- ROS/PFSH Details Patient Name: [name], [name]. Date of Service: 09/16/2017 8:30 AM Medical Record Number: [id number] Patient Account Number: [id number] Date of Birth/Sex: 1951/03/04 (67 y.o. M) Treating RN: Renne Crigler Primary Care Provider: Leotis Shames Other Clinician: Referring Provider: Leotis Shames Treating Provider/Extender: STONE III, HOYT Weeks in Treatment: 1 Information Obtained From Patient Wound History  Do you currently have one or more open woundso Yes How many open wounds do you currently haveo 1 Approximately how long have you had your woundso nov 2018 How have you  been treating your wound(s) until nowo band-aide, neo sporinHas your wound(s) ever healed and then re-openedo No Have you had any lab work done in the past montho No Have you tested positive for an antibiotic resistant organism (MRSA, VRE)o No Have you tested positive for osteomyelitis (bone infection)o No Have you had any tests for circulation on your legso No Have you had other problems associated with your woundso Swelling Constitutional Symptoms ([name]) Complaints and Symptoms: Negative for: Fever; Chills Eyes Medical History: Positive for: Cataracts - surgery Hematologic/Lymphatic Medical History: Positive for: Anemia; Lymphedema Respiratory Complaints and Symptoms: No Complaints or Symptoms Cardiovascular Complaints and Symptoms: No Complaints or Symptoms Medical History: Positive for: Hypertension Endocrine Medical History: Positive for: Type II Diabetes Time with diabetes: 15 yrs Treated with: Insulin, Oral agents [name], [name] ([id number]) Blood sugar tested every day: Yes Tested : Psychiatric Complaints and Symptoms: No Complaints or Symptoms HBO Extended History Items Eyes: Cataracts Immunizations Pneumococcal Vaccine: Received Pneumococcal Vaccination: Yes Implantable Devices Family and Social History Cancer: Yes - Father; Diabetes: No; Heart Disease: Yes - Father; Hereditary Spherocytosis: No; Hypertension: No; Kidney Disease: No; Lung Disease: No; Seizures: No; Stroke: No; Thyroid Problems: No; Tuberculosis: No; Never smoker; Marital Status - Married; Alcohol Use: Never; Drug Use: No History; Caffeine Use: Daily; Financial Concerns: No; Food, Clothing or Shelter Needs: No; Support System Lacking: No; Transportation Concerns: No; Advanced Directives: No; Patient does not want information on Advanced Directives; Do not resuscitate: No; Living Will: Yes (Copy provided); Medical Power of Attorney: No Physician Affirmation I have reviewed and  agree with the above information. Electronic Signature(s) Signed: 09/16/2017 4:53:36 PM By: Renne Crigler Signed: 09/16/2017 11:25:57 PM By: Lenda Kelp PA-C Entered By: Lenda Kelp on 09/16/2017 10:30:13 [name], [name] ([id number]) -------------------------------------------------------------------------------- SuperBill Details Patient Name: JEVANTE, HOLLIBAUGH. Date of Service: 09/16/2017 Medical Record Number: [id number] Patient Account Number: [id number] Date of Birth/Sex: 01-Feb-1951 (67 y.o. M) Treating RN: Renne Crigler Primary Care Provider: Leotis Shames Other Clinician: Referring Provider: Leotis Shames Treating Provider/Extender: Linwood Dibbles, HOYT Weeks in Treatment: 1 Diagnosis Coding ICD-10 Codes Code Description E11.622 Type 2 diabetes mellitus with other skin ulcer 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 of other part of left lower leg with fat layer exposed I89.0 Lymphedema, not elsewhere classified G70.00 Myasthenia gravis without (acute) exacerbation I87.2 Venous insufficiency (chronic) (peripheral) I73.9 Peripheral vascular disease, unspecified Facility Procedures CPT4 Code Description: 08657846 11042 - DEB SUBQ TISSUE 20 SQ CM/< ICD-10 Diagnosis Description 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 of other part of left lower leg with Modifier: fat layer expos Quantity: 1 ed Physician Procedures CPT4 Code Description: [id number] 11042 - WC PHYS SUBQ TISS 20 SQ CM ICD-10 Diagnosis Description 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 of other part of left lower leg with Modifier: fat layer expos Quantity: 1 ed Electronic Signature(s) Signed: 09/16/2017 11:50:23 PM By: Lenda Kelp PA-C Entered By: Lenda Kelp on 09/16/2017 23:33:12

## 2017-09-17 NOTE — Progress Notes (Signed)
ONIE, HAYASHI ([id number]) Visit Report for 09/16/2017 Arrival Information Details Patient Name: [name], [name]. Date of Service: 09/16/2017 8:30 AM Medical Record Number: [id number] Patient Account Number: [id number] Date of Birth/Sex: [birth year] (67 y.o. M) Treating RN: Curtis Sites Primary Care Nazeer Romney: Leotis Shames Other Clinician: Referring Najia Hurlbutt: Leotis Shames Treating Jiyaan Steinhauser/Extender: Linwood Dibbles, HOYT Weeks in Treatment: 1 Visit Information History Since Last Visit Added or deleted any medications: No Patient Arrived: Cane Any new allergies or adverse reactions: No Arrival Time: 08:32 Had a fall or experienced change in No Accompanied By: self activities of daily living that may affect Transfer Assistance: None risk of falls: Patient Identification Verified: Yes Signs or symptoms of abuse/neglect since last visito No Secondary Verification Process Completed: Yes Hospitalized since last visit: No Patient Requires Transmission-Based Precautions: No Implantable device outside of the clinic excluding No Patient Has Alerts: Yes cellular tissue based products placed in the center Patient Alerts: DM II since last visit: Has Dressing in Place as Prescribed: Yes Has Compression in Place as Prescribed: Yes Pain Present Now: No Electronic Signature(s) Signed: 09/16/2017 4:53:29 PM By: Curtis Sites Entered By: Curtis Sites on 09/16/2017 08:32:36 [name] ([id number]) -------------------------------------------------------------------------------- Encounter Discharge Information Details Patient Name: [name], Douglas L. Date of Service: 09/16/2017 8:30 AM Medical Record Number: [id number] Patient Account Number: [id number] Date of Birth/Sex: 22-Oct-1950 (67 y.o. M) Treating RN: Huel Coventry Primary Care Sarp Vernier: Leotis Shames Other Clinician: Referring Juvon Teater: Leotis Shames Treating Timonthy Hovater/Extender: Linwood Dibbles, HOYT Weeks in Treatment: 1 Encounter  Discharge Information Items Discharge Condition: Stable Ambulatory Status: Ambulatory Discharge Destination: Home Transportation: Private Auto Schedule Follow-up Appointment: Yes Clinical Summary of Care: Electronic Signature(s) Signed: 09/16/2017 4:41:13 PM By: Elliot Gurney, BSN, RN, CWS, Kim RN, BSN Entered By: Elliot Gurney, BSN, RN, CWS, Kim on 09/16/2017 16:41:13 [name] ([id number]) -------------------------------------------------------------------------------- Lower Extremity Assessment Details Patient Name: [name], [name]. Date of Service: 09/16/2017 8:30 AM Medical Record Number: [id number] Patient Account Number: [id number] Date of Birth/Sex: Aug 22, 1950 (67 y.o. M) Treating RN: Curtis Sites Primary Care Kaylana Fenstermacher: Leotis Shames Other Clinician: Referring Mileydi Milsap: Leotis Shames Treating Clive Parcel/Extender: STONE III, HOYT Weeks in Treatment: 1 Edema Assessment Assessed: [Left: No] [Right: No] [Left: Edema] [Right: :] Calf Left: Right: Point of Measurement: 35 cm From Medial Instep 40.6 cm cm Ankle Left: Right: Point of Measurement: 14 cm From Medial Instep 23.6 cm cm Vascular Assessment Pulses: Dorsalis Pedis Palpable: [Left:Yes] Posterior Tibial Extremity colors, hair growth, and conditions: Extremity Color: [Left:Hyperpigmented] Hair Growth on Extremity: [Left:No] Temperature of Extremity: [Left:Warm] Capillary Refill: [Left:< 3 seconds] Toe Nail Assessment Left: Right: Thick: Yes Discolored: No Deformed: No Improper Length and Hygiene: No Electronic Signature(s) Signed: 09/16/2017 4:53:29 PM By: Curtis Sites Entered By: Curtis Sites on 09/16/2017 08:39:06 [name], [name] -------------------------------------------------------------------------------- Multi Wound Chart Details Patient Name: Douglas Huh L. Date of Service: 09/16/2017 8:30 AM Medical Record Number: [id number] Patient Account Number: [id number] Date of Birth/Sex:  Aug 27, 1950 (67 y.o. M) Treating RN: Renne Crigler Primary Care Alyiah Ulloa: Leotis Shames Other Clinician: Referring Sania Noy: Leotis Shames Treating Devun Anna/Extender: STONE III, HOYT Weeks in Treatment: 1 Vital Signs Height(in): 72 Pulse(bpm): 64 Weight(lbs): 323.9 Blood Pressure(mmHg): 156/70 Body Mass Index(BMI): 44 Temperature(F): 98.1 Respiratory Rate 16 (breaths/min): Photos: [N/A:N/A] Wound Location: Left Lower Leg - Lateral N/A N/A Wounding Event: Gradually Appeared N/A N/A Primary Etiology: Diabetic Wound/Ulcer of the N/A N/A Lower Extremity Comorbid History: Cataracts, Anemia, N/A N/A Lymphedema, Hypertension, Type II Diabetes Date Acquired: 08/26/2017 N/A N/A Weeks of Treatment: 1 N/A N/A Wound  Status: Open N/A N/A Measurements L x W x D 2.1x2.3x0.2 N/A N/A (cm) Area (cm) : 3.793 N/A N/A Volume (cm) : 0.759 N/A N/A % Reduction in Area: -21.00% N/A N/A % Reduction in Volume: -142.50% N/A N/A Classification: Grade 1 N/A N/A Exudate Amount: Large N/A N/A Exudate Type: Serous N/A N/A Exudate Color: amber N/A N/A Wound Margin: Flat and Intact N/A N/A Granulation Amount: Medium (34-66%) N/A N/A Granulation Quality: Red N/A N/A Necrotic Amount: Medium (34-66%) N/A N/A Exposed Structures: Fat Layer (Subcutaneous N/A N/A Tissue) Exposed: Yes Fascia: No Tendon: No Muscle: No Gains, [name] ([id number]) Joint: No Bone: No Epithelialization: None N/A N/A Periwound Skin Texture: Excoriation: No N/A N/A Induration: No Callus: No Crepitus: No Rash: No Scarring: No Periwound Skin Moisture: Maceration: No N/A N/A Dry/Scaly: No Periwound Skin Color: Erythema: Yes N/A N/A Atrophie Blanche: No Cyanosis: No Ecchymosis: No Hemosiderin Staining: No Mottled: No Pallor: No Rubor: No Erythema Location: Circumferential N/A N/A Temperature: No Abnormality N/A N/A Tenderness on Palpation: Yes N/A N/A Wound Preparation: Ulcer Cleansing: N/A  N/A Rinsed/Irrigated with Saline, Other: soap and water Topical Anesthetic Applied: Other: lidocaine 4% Treatment Notes Electronic Signature(s) Signed: 09/16/2017 4:53:36 PM By: Renne CriglerFlinchum, Cheryl Entered By: Renne CriglerFlinchum, Cheryl on 09/16/2017 09:10:58 Vassie LollHUNTER, Douglas L. ([id number]) -------------------------------------------------------------------------------- Multi-Disciplinary Care Plan Details Patient Name: Vassie LollHUNTER, Douglas L. Date of Service: 09/16/2017 8:30 AM Medical Record Number: [id number] Patient Account Number: [id number] Date of Birth/Sex: 1950-06-28 (67 y.o. M) Treating RN: Renne CriglerFlinchum, Cheryl Primary Care Azavier Creson: Leotis ShamesSINGH, JASMINE Other Clinician: Referring Leira Regino: Leotis ShamesSINGH, JASMINE Treating Taitum Alms/Extender: STONE III, HOYT Weeks in Treatment: 1 Active Inactive ` Orientation to the Wound Care Program Nursing Diagnoses: Knowledge deficit related to the wound healing center program Goals: Patient/caregiver will verbalize understanding of the Wound Healing Center Program Date Initiated: 09/09/2017 Target Resolution Date: 09/30/2017 Goal Status: Active Interventions: Provide education on orientation to the wound center Notes: ` Wound/Skin Impairment Nursing Diagnoses: Impaired tissue integrity Goals: Patient/caregiver will verbalize understanding of skin care regimen Date Initiated: 09/09/2017 Target Resolution Date: 09/28/2017 Goal Status: Active Ulcer/skin breakdown will have a volume reduction of 30% by week 4 Date Initiated: 09/09/2017 Target Resolution Date: 09/28/2017 Goal Status: Active Interventions: Assess patient/caregiver ability to obtain necessary supplies Assess patient/caregiver ability to perform ulcer/skin care regimen upon admission and as needed Assess ulceration(s) every visit Treatment Activities: Skin care regimen initiated : 09/09/2017 Notes: Electronic Signature(s) Signed: 09/16/2017 4:53:36 PM By: Sofie RowerFlinchum, Cheryl [name], Douglas LangoICHARD L.  ([id number]) Entered By: Renne CriglerFlinchum, Cheryl on 09/16/2017 09:10:51 Vassie LollHUNTER, Douglas L. ([id number]) -------------------------------------------------------------------------------- Pain Assessment Details Patient Name: Douglas HuhHUNTER, Douglas L. Date of Service: 09/16/2017 8:30 AM Medical Record Number: [id number] Patient Account Number: [id number] Date of Birth/Sex: 1950-06-28 (67 y.o. M) Treating RN: Curtis Sitesorthy, Joanna Primary Care Deyonte Cadden: Leotis ShamesSINGH, JASMINE Other Clinician: Referring Beryle Zeitz: Leotis ShamesSINGH, JASMINE Treating Eniyah Eastmond/Extender: STONE III, HOYT Weeks in Treatment: 1 Active Problems Location of Pain Severity and Description of Pain Patient Has Paino No Site Locations Pain Management and Medication Current Pain Management: Electronic Signature(s) Signed: 09/16/2017 4:53:29 PM By: Curtis Sitesorthy, Joanna Entered By: Curtis Sitesorthy, Joanna on 09/16/2017 08:35:14 Vassie LollHUNTER, Falon L. ([id number]) -------------------------------------------------------------------------------- Patient/Caregiver Education Details Patient Name: Douglas HuhHUNTER, Chick L. Date of Service: 09/16/2017 8:30 AM Medical Record Number: [id number] Patient Account Number: [id number] Date of Birth/Gender: 1950-06-28 (67 y.o. M) Treating RN: Huel CoventryWoody, Kim Primary Care Physician: Leotis ShamesSINGH, JASMINE Other Clinician: Referring Physician: Leotis ShamesSINGH, JASMINE Treating Physician/Extender: Skeet SimmerSTONE III, HOYT Weeks in Treatment: 1 Education Assessment Education Provided To: Patient Education Topics Provided Venous: Handouts: Controlling Swelling with Multilayered Compression  Wraps Methods: Demonstration, Explain/Verbal Responses: State content correctly Electronic Signature(s) Signed: 09/16/2017 5:17:30 PM By: Elliot Gurney, BSN, RN, CWS, Kim RN, BSN Entered By: Elliot Gurney, BSN, RN, CWS, Kim on 09/16/2017 16:41:28 Daelen, [place] [name] ([id number]) -------------------------------------------------------------------------------- Wound Assessment Details Patient Name: [name], NAVARRO. Date of Service: 09/16/2017 8:30 AM Medical Record Number: [id number] Patient Account Number: [id number] Date of Birth/Sex: 01-Apr-1950 (67 y.o. M) Treating RN: Curtis Sites Primary Care Leisha Trinkle: Leotis Shames Other Clinician: Referring Lynzy Rawles: Leotis Shames Treating Crystalyn Delia/Extender: STONE III, HOYT Weeks in Treatment: 1 Wound Status Wound Number: 1 Primary Diabetic Wound/Ulcer of the Lower Extremity Etiology: Wound Location: Left Lower Leg - Lateral Wound Status: Open Wounding Event: Gradually Appeared Comorbid Cataracts, Anemia, Lymphedema, Date Acquired: 08/26/2017 History: Hypertension, Type II Diabetes Weeks Of Treatment: 1 Clustered Wound: No Photos Photo Uploaded By: Curtis Sites on 09/16/2017 09:04:29 Wound Measurements Length: (cm) 2.1 Width: (cm) 2.3 Depth: (cm) 0.2 Area: (cm) 3.793 Volume: (cm) 0.759 % Reduction in Area: -21% % Reduction in Volume: -142.5% Epithelialization: None Tunneling: No Undermining: No Wound Description Classification: Grade 1 Wound Margin: Flat and Intact Exudate Amount: Large Exudate Type: Serous Exudate Color: amber Foul Odor After Cleansing: No Slough/Fibrino Yes Wound Bed Granulation Amount: Medium (34-66%) Exposed Structure Granulation Quality: Red Fascia Exposed: No Necrotic Amount: Medium (34-66%) Fat Layer (Subcutaneous Tissue) Exposed: Yes Necrotic Quality: Adherent Slough Tendon Exposed: No Muscle Exposed: No Joint Exposed: No Bone Exposed: No Periwound Skin Texture Dorminey, Douglas L. ([id number]) Texture Color No Abnormalities Noted: No No Abnormalities Noted: No Callus: No Atrophie Blanche: No Crepitus: No Cyanosis: No Excoriation: No Ecchymosis: No Induration: No Erythema: Yes Rash: No Erythema Location: Circumferential Scarring: No Hemosiderin Staining: No Mottled: No Moisture Pallor: No No Abnormalities Noted: No Rubor: No Dry / Scaly: No Maceration: No Temperature /  Pain Temperature: No Abnormality Tenderness on Palpation: Yes Wound Preparation Ulcer Cleansing: Rinsed/Irrigated with Saline, Other: soap and water, Topical Anesthetic Applied: Other: lidocaine 4%, Treatment Notes Wound #1 (Left, Lateral Lower Leg) 1. Cleansed with: Clean wound with Normal Saline 2. Anesthetic Topical Lidocaine 4% cream to wound bed prior to debridement 4. Dressing Applied: Iodoflex 5. Secondary Dressing Applied ABD Pad 7. Secured with 3 Layer Compression System - Left Lower Extremity Notes unna to anchor; protouch with toes out Electronic Signature(s) Signed: 09/16/2017 4:53:29 PM By: Curtis Sites Entered By: Curtis Sites on 09/16/2017 08:41:57 Alban, [name] ([id number]) -------------------------------------------------------------------------------- Vitals Details Patient Name: SHAHEER, BONFIELD L. Date of Service: 09/16/2017 8:30 AM Medical Record Number: [id number] Patient Account Number: [id number] Date of Birth/Sex: 11/19/1950 (67 y.o. M) Treating RN: Curtis Sites Primary Care Tavita Eastham: Leotis Shames Other Clinician: Referring Arabia Nylund: Leotis Shames Treating Evelynne Spiers/Extender: STONE III, HOYT Weeks in Treatment: 1 Vital Signs Time Taken: 08:35 Temperature (F): 98.1 Height (in): 72 Pulse (bpm): 64 Weight (lbs): 323.9 Respiratory Rate (breaths/min): 16 Body Mass Index (BMI): 43.9 Blood Pressure (mmHg): 156/70 Reference Range: 80 - 120 mg / dl Electronic Signature(s) Signed: 09/16/2017 4:53:29 PM By: Curtis Sites Entered By: Curtis Sites on 09/16/2017 08:35:56

## 2017-09-23 ENCOUNTER — Encounter: Payer: BLUE CROSS/BLUE SHIELD | Attending: Physician Assistant | Admitting: Physician Assistant

## 2017-09-23 DIAGNOSIS — E1151 Type 2 diabetes mellitus with diabetic peripheral angiopathy without gangrene: Secondary | ICD-10-CM | POA: Insufficient documentation

## 2017-09-23 DIAGNOSIS — G7000 Myasthenia gravis without (acute) exacerbation: Secondary | ICD-10-CM | POA: Insufficient documentation

## 2017-09-23 DIAGNOSIS — L97822 Non-pressure chronic ulcer of other part of left lower leg with fat layer exposed: Secondary | ICD-10-CM | POA: Diagnosis not present

## 2017-09-23 DIAGNOSIS — E1136 Type 2 diabetes mellitus with diabetic cataract: Secondary | ICD-10-CM | POA: Diagnosis not present

## 2017-09-23 DIAGNOSIS — I10 Essential (primary) hypertension: Secondary | ICD-10-CM | POA: Diagnosis not present

## 2017-09-23 DIAGNOSIS — Z79899 Other long term (current) drug therapy: Secondary | ICD-10-CM | POA: Diagnosis not present

## 2017-09-23 DIAGNOSIS — Z7984 Long term (current) use of oral hypoglycemic drugs: Secondary | ICD-10-CM | POA: Insufficient documentation

## 2017-09-23 DIAGNOSIS — I890 Lymphedema, not elsewhere classified: Secondary | ICD-10-CM | POA: Diagnosis not present

## 2017-09-23 DIAGNOSIS — I872 Venous insufficiency (chronic) (peripheral): Secondary | ICD-10-CM | POA: Insufficient documentation

## 2017-09-23 DIAGNOSIS — E11622 Type 2 diabetes mellitus with other skin ulcer: Secondary | ICD-10-CM | POA: Insufficient documentation

## 2017-09-26 NOTE — Progress Notes (Signed)
[name], [name] ([id number]) Visit Report for 09/23/2017 Chief Complaint Document Details Patient Name: [name], [name]. Date of Service: 09/23/2017 8:15 AM Medical Record Number: [id number] Patient Account Number: [id number] Date of Birth/Sex: 1950/08/29 (67 y.o. M) Treating RN: Renne Crigler Primary Care Provider: Leotis Shames Other Clinician: Referring Provider: Leotis Shames Treating Provider/Extender: Linwood Dibbles, Deaglan Lile Weeks in Treatment: 2 Information Obtained from: Patient Chief Complaint Left leg ulcer Electronic Signature(s) Signed: 09/24/2017 8:11:30 AM By: Lenda Kelp PA-C Entered By: Lenda Kelp on 09/23/2017 08:19:50 Willet, [name] ([id number]) -------------------------------------------------------------------------------- Debridement Details Patient Name: [name], [name] L. Date of Service: 09/23/2017 8:15 AM Medical Record Number: [id number] Patient Account Number: [id number] Date of Birth/Sex: 1950/10/18 (67 y.o. M) Treating RN: Renne Crigler Primary Care Provider: Leotis Shames Other Clinician: Referring Provider: Leotis Shames Treating Provider/Extender: STONE III, Hawley Michel Weeks in Treatment: 2 Debridement Performed for Wound #1 Left,Lateral Lower Leg Assessment: Performed By: Physician STONE III, Fayez Sturgell E., PA-C Debridement Type: Debridement Severity of Tissue Pre Fat layer exposed Debridement: Pre-procedure Verification/Time Yes - 08:42 Out Taken: Start Time: 08:42 Pain Control: Other : lidocaIne 4% Total Area Debrided (L x W): 1.9 (cm) x 1.8 (cm) = 3.42 (cm) Tissue and other material Viable, Non-Viable, Slough, Subcutaneous, Biofilm, Slough debrided: Level: Skin/Subcutaneous Tissue Debridement Description: Excisional Instrument: Curette Bleeding: Moderate Hemostasis Achieved: Pressure End Time: 08:43 Procedural Pain: 0 Post Procedural Pain: 0 Response to Treatment: Procedure was tolerated well Level of Consciousness: Awake and  Alert Post Procedure Vitals: Temperature: 97.8 Pulse: 63 Respiratory Rate: 18 Blood Pressure: Systolic Blood Pressure: 143 Diastolic Blood Pressure: 74 Post Debridement Measurements of Total Wound Length: (cm) 1.9 Width: (cm) 1.8 Depth: (cm) 0.2 Volume: (cm) 0.537 Character of Wound/Ulcer Post Debridement: Stable Severity of Tissue Post Debridement: Fat layer exposed Post Procedure Diagnosis Same as Pre-procedure Electronic Signature(s) Signed: 09/24/2017 8:11:30 AM By: Lenda Kelp PA-C Signed: 09/25/2017 3:34:51 PM By: Sofie Rower, [name] ([id number]) Entered By: Renne Crigler on 09/23/2017 08:44:20 Douglas Loll ([id number]) -------------------------------------------------------------------------------- HPI Details Patient Name: LAMBERT, JEANTY L. Date of Service: 09/23/2017 8:15 AM Medical Record Number: [id number] Patient Account Number: [id number] Date of Birth/Sex: [birth year] (67 y.o. M) Treating RN: Renne Crigler Primary Care Provider: Leotis Shames Other Clinician: Referring Provider: Leotis Shames Treating Provider/Extender: STONE III, Kynsie Falkner Weeks in Treatment: 2 History of Present Illness HPI Description: MYASTHENIA GRAVIS (MG) MEDICATION ALERT: Medications that increase weakness in most MG patients should be avoided or used only with great caution. These include: - Fluoroquinolone antibiotics: moxifloxacin (Aveloxo), ciprofloxacin (Ciproo, Proquin XRo), norfloxacin (Noroxino), levofloxacin (Levaquino), ofloxacin (Floxino), gemifloxacin (Factiveo) - Aminoglycoside antibiotics: tobramycin, gentamycin, kanamycin, neomycin, streptomycin - Macrolide antibiotics: erythromycin, azithromycin (Z-Pako, Zithromaxo), telithromycin (Keteko) - Botulinum toxins: Botoxo, Myobloco, Dysporto, Xeomino - Beta-blockers such as propanolol or timolol eyedrops (Timoptico) - Calcium channel blockers (blood pressure medications) - Quinine, quinidine or  procainamide - D-penicillamine (do not confuse with penicillin) - Interferon - Magnesium salts: milk of magnesia, some antacids (Maaloxo, Mylantao) - Curare and related drugs (usually used only during surgery) Other medications may produce problems in some MG patients. Be sure your doctor/dentist knows you have MG when any new medication is prescribed. Contact your myasthenia doctor if you or your local doctor has questions about medications. 09/09/17 on evaluation today patient actually appears for initial evaluation today concerning his left lateral lower extremity ulcer which has been present he tells me since November 2018. He states that gas logs that he'd forgotten he had said somewhere actually fell over landing  on his leg causing the injury and unfortunately the left leg has never fully recovered since that time. He does note that there was some improvement initially and now it's pretty much stagnant. He does have venous stasis chronically although he states it's been worse and pass compared to current. He has also been diagnosed with lymphedema although again this is something else that has been deemed the worst in the past compared to present. He does have lymphedema pumps although he really has not been using those on a regular basis. Patient does have diabetes mellitus type II in other than the venous insufficiency and prayerful vascular disease he also has myasthenia gravis board she is on medications long-term. Currently he has been tolerating the medications for this very well he does have a list as noted above in the HPI of medications that he is to avoid that we need to keep in mind. Nonetheless the patient seems to be doing fairly well in general. I do believe his peripheral edema may be contributing to the nonhealing factor in regard to this ulcer. 09/16/17 on evaluation today patient appears to be doing rather well his wound actually appears to show signs of improvement compared  to previous evaluation which is great news. Overall his wound is a little larger but again I did debride the wound sharply last week so this is definitely expected as far as I was concerned. Nonetheless the wound bed itself seems to be doing excellent. He is having no discomfort normally he does have a little bit of pain with cleansing of the wound by myself today. 09/23/17 on evaluation today patient actually appears to be showing signs of good improvement in regard to his left lateral motion the ulcer. He's been tolerating the dressing changes without complication. Fortunately there does not appear to be any evidence of infection at this point which is great news. Overall I'm extremely happy that he seems to be doing so well and the patient likewise wants to be healed as soon as possible he actually has a pull at home he would love to be in but he hasn't been able to get into this due to what's going on currently. Electronic Signature(s) Signed: 09/24/2017 8:11:30 AM By: Lenda Kelp PA-C Entered By: Lenda Kelp on 09/23/2017 08:59:04 [name], [name] ([id number]) [name], [name] ([id number]) -------------------------------------------------------------------------------- Physical Exam Details Patient Name: [name], [name]. Date of Service: 09/23/2017 8:15 AM Medical Record Number: [id number] Patient Account Number: [id number] Date of Birth/Sex: Apr 05, 1950 (67 y.o. M) Treating RN: Renne Crigler Primary Care Provider: Leotis Shames Other Clinician: Referring Provider: Leotis Shames Treating Provider/Extender: STONE III, Devine Dant Weeks in Treatment: 2 Constitutional Obese and well-hydrated in no acute distress. Respiratory normal breathing without difficulty. Psychiatric this patient is able to make decisions and demonstrates good insight into disease process. Alert and Oriented x 3. pleasant and cooperative. Notes At this point patient's wound actually seems to show signs of  good improvement he does have some issues with [facility] noted on the lateral portion of the wound although in general this seems to be improving in an excellent fashion. This did require sharp debridement today I was able to remove a majority of the slough noted although there was still a little bit remaining this was more firmly attached are more painfulo To continue to work with the Iodoflex. Electronic Signature(s) Signed: 09/24/2017 8:11:30 AM By: Lenda Kelp PA-C Entered By: Lenda Kelp on 09/23/2017 09:14:31 Petrucelli, [name] ([id number]) -------------------------------------------------------------------------------- Physician Orders  Details Patient Name: [name], Douglas L. Date of Service: 09/23/2017 8:15 AM Medical Record Number: [id number] Patient Account Number: [id number] Date of Birth/Sex: April 21, 1950 (67 y.o. M) Treating RN: Renne CriglerFlinchum, Cheryl Primary Care Provider: Leotis ShamesSINGH, JASMINE Other Clinician: Referring Provider: Leotis ShamesSINGH, JASMINE Treating Provider/Extender: Linwood DibblesSTONE III, Markeia Harkless Weeks in Treatment: 2 Verbal / Phone Orders: No Diagnosis Coding ICD-10 Coding Code Description E11.622 Type 2 diabetes mellitus with other skin ulcer 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 of other part of left lower leg with fat layer exposed I89.0 Lymphedema, not elsewhere classified G70.00 Myasthenia gravis without (acute) exacerbation I87.2 Venous insufficiency (chronic) (peripheral) I73.9 Peripheral vascular disease, unspecified Wound Cleansing Wound #1 Left,Lateral Lower Leg o Clean wound with Normal Saline. Anesthetic (add to Medication List) Wound #1 Left,Lateral Lower Leg o Topical Lidocaine 4% cream applied to wound bed prior to debridement (In Clinic Only). Skin Barriers/Peri-Wound Care Wound #1 Left,Lateral Lower Leg o Moisturizing lotion Primary Wound Dressing Wound #1 Left,Lateral Lower Leg o Iodoflex Secondary Dressing Wound #1 Left,Lateral Lower Leg o ABD pad Dressing  Change Frequency Wound #1 Left,Lateral Lower Leg o Dressing is to be changed Monday and Thursday. Follow-up Appointments Wound #1 Left,Lateral Lower Leg o Return Appointment in 1 week. o Nurse Visit as needed Edema Control Wound #1 Left,Lateral Lower Leg [name], Isahia L. ([id number]) o 3 Layer Compression System - Left Lower Extremity - unna to anchor o Compression Pump: Use compression pump on left lower extremity for 30 minutes, twice daily. - work up to 1 hour twice daily o Compression Pump: Use compression pump on right lower extremity for 30 minutes, twice daily. - work up to 1 hour twice daily Additional Orders / Instructions Wound #1 Left,Lateral Lower Leg o Vitamin A; Vitamin C, Zinc Electronic Signature(s) Signed: 09/24/2017 8:11:30 AM By: Lenda KelpStone III, Ethyle Tiedt PA-C Signed: 09/25/2017 3:34:51 PM By: Renne CriglerFlinchum, Cheryl Entered By: Renne CriglerFlinchum, Cheryl on 09/23/2017 08:46:56 [name], Douglas LangoICHARD L. ([id number]) -------------------------------------------------------------------------------- Problem List Details Patient Name: Lelon HuhHUNTER, Huy L. Date of Service: 09/23/2017 8:15 AM Medical Record Number: [id number] Patient Account Number: [id number] Date of Birth/Sex: April 21, 1950 (67 y.o. M) Treating RN: Renne CriglerFlinchum, Cheryl Primary Care Provider: Leotis ShamesSINGH, JASMINE Other Clinician: Referring Provider: Leotis ShamesSINGH, JASMINE Treating Provider/Extender: Linwood DibblesSTONE III, Louise Rawson Weeks in Treatment: 2 Active Problems ICD-10 Evaluated Encounter Code Description Active Date Today Diagnosis E11.622 Type 2 diabetes mellitus with other skin ulcer 09/09/2017 No Yes 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 of other part of left lower leg with 09/09/2017 No Yes fat layer exposed I89.0 Lymphedema, not elsewhere classified 09/09/2017 No Yes G70.00 Myasthenia gravis without (acute) exacerbation 09/09/2017 No Yes I87.2 Venous insufficiency (chronic) (peripheral) 09/09/2017 No Yes I73.9 Peripheral vascular disease, unspecified  09/09/2017 No Yes Inactive Problems Resolved Problems Electronic Signature(s) Signed: 09/24/2017 8:11:30 AM By: Lenda KelpStone III, Taniqua Issa PA-C Entered By: Lenda KelpStone III, Zephyr Sausedo on 09/23/2017 08:18:52 [name], Douglas LangoICHARD L. ([id number]) -------------------------------------------------------------------------------- Progress Note Details Patient Name: [name], Indy L. Date of Service: 09/23/2017 8:15 AM Medical Record Number: [id number] Patient Account Number: [id number] Date of Birth/Sex: April 21, 1950 (67 y.o. M) Treating RN: Renne CriglerFlinchum, Cheryl Primary Care Provider: Leotis ShamesSINGH, JASMINE Other Clinician: Referring Provider: Leotis ShamesSINGH, JASMINE Treating Provider/Extender: STONE III, Samra Pesch Weeks in Treatment: 2 Subjective Chief Complaint Information obtained from Patient Left leg ulcer History of Present Illness (HPI) MYASTHENIA GRAVIS (MG) MEDICATION ALERT: Medications that increase weakness in most MG patients should be avoided or used only with great caution. These include: - Fluoroquinolone antibiotics: moxifloxacin (Avelox), ciprofloxacin (Cipro, Proquin XR), norfloxacin (Noroxin), levofloxacin (Levaquin), ofloxacin (Floxin), gemifloxacin (Factive) - Aminoglycoside antibiotics: tobramycin, gentamycin, kanamycin, neomycin, streptomycin -  Macrolide antibiotics: erythromycin, azithromycin (Z-Pak, Zithromax), telithromycin (Ketek) - Botulinum toxins: Botox, Myobloc, Dysport, Xeomin - Beta-blockers such as propanolol or timolol eyedrops (Timoptic) - Calcium channel blockers (blood pressure medications) - Quinine, quinidine or procainamide - D-penicillamine (do not confuse with penicillin) - Interferon - Magnesium salts: milk of magnesia, some antacids (Maalox, Mylanta) - Curare and related drugs (usually used only during surgery) Other medications may produce problems in some MG patients. Be sure your doctor/dentist knows you have MG when any new medication is prescribed. Contact your  myasthenia doctor if you or your local doctor has questions about medications. 09/09/17 on evaluation today patient actually appears for initial evaluation today concerning his left lateral lower extremity ulcer which has been present he tells me since November 2018. He states that gas logs that he'd forgotten he had said somewhere actually fell over landing on his leg causing the injury and unfortunately the left leg has never fully recovered since that time. He does note that there was some improvement initially and now it's pretty much stagnant. He does have venous stasis chronically although he states it's been worse and pass compared to current. He has also been diagnosed with lymphedema although again this is something else that has been deemed the worst in the past compared to present. He does have lymphedema pumps although he really has not been using those on a regular basis. Patient does have diabetes mellitus type II in other than the venous insufficiency and prayerful vascular disease he also has myasthenia gravis board she is on medications long-term. Currently he has been tolerating the medications for this very well he does have a list as noted above in the HPI of medications that he is to avoid that we need to keep in mind. Nonetheless the patient seems to be doing fairly well in general. I do believe his peripheral edema may be contributing to the nonhealing factor in regard to this ulcer. 09/16/17 on evaluation today patient appears to be doing rather well his wound actually appears to show signs of improvement compared to previous evaluation which is great news. Overall his wound is a little larger but again I did debride the wound sharply last week so this is definitely expected as far as I was concerned. Nonetheless the wound bed itself seems to be doing excellent. He is having no discomfort normally he does have a little bit of pain with cleansing of the wound by  myself today. 09/23/17 on evaluation today patient actually appears to be showing signs of good improvement in regard to his left lateral motion the ulcer. He's been tolerating the dressing changes without complication. Fortunately there does not appear to be any evidence of infection at this point which is great news. Overall I'm extremely happy that he seems to be doing so well and the patient likewise wants to be healed as soon as possible he actually has a pull at home he would love to be in but he hasn't FINIS, HENDRICKSEN L. ([id number]) been able to get into this due to what's going on currently. Patient History Information obtained from Patient. Family History Cancer - Father, Heart Disease - Father, No family history of Diabetes, Hereditary Spherocytosis, Hypertension, Kidney Disease, Lung Disease, Seizures, Stroke, Thyroid Problems, Tuberculosis. Social History Never smoker, Marital Status - Married, Alcohol Use - Never, Drug Use - No History, Caffeine Use - Daily. Review of Systems (ROS) Constitutional Symptoms (General Health) Denies complaints or symptoms of Fever, Chills, Marked Weight Change. Respiratory The patient  has no complaints or symptoms. Cardiovascular The patient has no complaints or symptoms. Psychiatric The patient has no complaints or symptoms. Objective Constitutional Obese and well-hydrated in no acute distress. Vitals Time Taken: 8:23 AM, Height: 72 in, Weight: 323.9 lbs, BMI: 43.9, Temperature: 97.8 F, Pulse: 63 bpm, Respiratory Rate: 18 breaths/min, Blood Pressure: 143/74 mmHg. Respiratory normal breathing without difficulty. Psychiatric this patient is able to make decisions and demonstrates good insight into disease process. Alert and Oriented x 3. pleasant and cooperative. General Notes: At this point patient's wound actually seems to show signs of good improvement he does have some issues with [facility] noted on the lateral portion of the wound  although in general this seems to be improving in an excellent fashion. This did require sharp debridement today I was able to remove a majority of the slough noted although there was still a little bit remaining this was more firmly attached are more painful To continue to work with the Iodoflex. Integumentary (Hair, Skin) Wound #1 status is Open. Original cause of wound was Gradually Appeared. The wound is located on the Left,Lateral Lower Leg. The wound measures 1.9cm length x 1.8cm width x 0.2cm depth; 2.686cm^2 area and 0.537cm^3 volume. There is Fat Layer (Subcutaneous Tissue) Exposed exposed. There is no tunneling or undermining noted. There is a large amount of serous drainage noted. The wound margin is flat and intact. There is medium (34-66%) red granulation within the wound bed. KRISHANG, READING ([id number]) There is a medium (34-66%) amount of necrotic tissue within the wound bed including Adherent Slough. The periwound skin appearance exhibited: Erythema. The periwound skin appearance did not exhibit: Callus, Crepitus, Excoriation, Induration, Rash, Scarring, Dry/Scaly, Maceration, Atrophie Blanche, Cyanosis, Ecchymosis, Hemosiderin Staining, Mottled, Pallor, Rubor. The surrounding wound skin color is noted with erythema which is circumferential. Periwound temperature was noted as No Abnormality. The periwound has tenderness on palpation. Assessment Active Problems ICD-10 Type 2 diabetes mellitus with other skin ulcer Non-pressure chronic ulcer of other part of left lower leg with fat layer exposed Lymphedema, not elsewhere classified Myasthenia gravis without (acute) exacerbation Venous insufficiency (chronic) (peripheral) Peripheral vascular disease, unspecified Procedures Wound #1 Pre-procedure diagnosis of Wound #1 is a Diabetic Wound/Ulcer of the Lower Extremity located on the Left,Lateral Lower Leg .Severity of Tissue Pre Debridement is: Fat layer exposed. There was a  Excisional Skin/Subcutaneous Tissue Debridement with a total area of 3.42 sq cm performed by STONE III, Ianmichael Amescua E., PA-C. With the following instrument(s): Curette to remove Viable and Non-Viable tissue/material. Material removed includes Subcutaneous Tissue, Slough, and Biofilm after achieving pain control using Other (lidocaIne 4%). No specimens were taken. A time out was conducted at 08:42, prior to the start of the procedure. A Moderate amount of bleeding was controlled with Pressure. The procedure was tolerated well with a pain level of 0 throughout and a pain level of 0 following the procedure. Patient s Level of Consciousness post procedure was recorded as Awake and Alert. Post Debridement Measurements: 1.9cm length x 1.8cm width x 0.2cm depth; 0.537cm^3 volume. Character of Wound/Ulcer Post Debridement is stable. Severity of Tissue Post Debridement is: Fat layer exposed. Post procedure Diagnosis Wound #1: Same as Pre-Procedure Plan Wound Cleansing: Wound #1 Left,Lateral Lower Leg: Clean wound with Normal Saline. Anesthetic (add to Medication List): Wound #1 Left,Lateral Lower Leg: Topical Lidocaine 4% cream applied to wound bed prior to debridement (In Clinic Only). Skin Barriers/Peri-Wound Care: Wound #1 Left,Lateral Lower Leg: Moisturizing lotion Primary Wound Dressing: [name], ROLLO. ([id number])  Wound #1 Left,Lateral Lower Leg: Iodoflex Secondary Dressing: Wound #1 Left,Lateral Lower Leg: ABD pad Dressing Change Frequency: Wound #1 Left,Lateral Lower Leg: Dressing is to be changed Monday and Thursday. Follow-up Appointments: Wound #1 Left,Lateral Lower Leg: Return Appointment in 1 week. Nurse Visit as needed Edema Control: Wound #1 Left,Lateral Lower Leg: 3 Layer Compression System - Left Lower Extremity - unna to anchor Compression Pump: Use compression pump on left lower extremity for 30 minutes, twice daily. - work up to 1 hour twice daily Compression Pump: Use  compression pump on right lower extremity for 30 minutes, twice daily. - work up to 1 hour twice daily Additional Orders / Instructions: Wound #1 Left,Lateral Lower Leg: Vitamin A; Vitamin C, Zinc I am going to recommend that we continue with the Current wound care measures for the next week. Patient is in agreement with plan. We will subsequently see were things stand at follow-up in one weeks time we see him back for reevaluation. Please see above for specific wound care orders. We will see patient for re-evaluation in 1 week(s) here in the clinic. If anything worsens or changes patient will contact our office for additional recommendations. Electronic Signature(s) Signed: 09/24/2017 8:11:30 AM By: Lenda Kelp PA-C Entered By: Lenda Kelp on 09/23/2017 09:15:03 Douglas Loll ([id number]) -------------------------------------------------------------------------------- ROS/PFSH Details Patient Name: HARTMAN, MINAHAN. Date of Service: 09/23/2017 8:15 AM Medical Record Number: [id number] Patient Account Number: [id number] Date of Birth/Sex: [birth year] (67 y.o. M) Treating RN: Renne Crigler Primary Care Provider: Leotis Shames Other Clinician: Referring Provider: Leotis Shames Treating Provider/Extender: STONE III, Yecenia Dalgleish Weeks in Treatment: 2 Information Obtained From Patient Wound History Do you currently have one or more open woundso Yes How many open wounds do you currently haveo 1 Approximately how long have you had your woundso nov 2018 How have you been treating your wound(s) until nowo band-aide, neo sporinHas your wound(s) ever healed and then re-openedo No Have you had any lab work done in the past montho No Have you tested positive for an antibiotic resistant organism (MRSA, VRE)o No Have you tested positive for osteomyelitis (bone infection)o No Have you had any tests for circulation on your legso No Have you had other problems associated with your woundso  Swelling Constitutional Symptoms (General Health) Complaints and Symptoms: Negative for: Fever; Chills; Marked Weight Change Eyes Medical History: Positive for: Cataracts - surgery Hematologic/Lymphatic Medical History: Positive for: Anemia; Lymphedema Respiratory Complaints and Symptoms: No Complaints or Symptoms Cardiovascular Complaints and Symptoms: No Complaints or Symptoms Medical History: Positive for: Hypertension Endocrine Medical History: Positive for: Type II Diabetes Time with diabetes: 17 yrs Treated with: Insulin, Oral agents NIRAV, SWEDA ([id number]) Blood sugar tested every day: Yes Tested : Psychiatric Complaints and Symptoms: No Complaints or Symptoms HBO Extended History Items Eyes: Cataracts Immunizations Pneumococcal Vaccine: Received Pneumococcal Vaccination: Yes Implantable Devices Family and Social History Cancer: Yes - Father; Diabetes: No; Heart Disease: Yes - Father; Hereditary Spherocytosis: No; Hypertension: No; Kidney Disease: No; Lung Disease: No; Seizures: No; Stroke: No; Thyroid Problems: No; Tuberculosis: No; Never smoker; Marital Status - Married; Alcohol Use: Never; Drug Use: No History; Caffeine Use: Daily; Financial Concerns: No; Food, Clothing or Shelter Needs: No; Support System Lacking: No; Transportation Concerns: No; Advanced Directives: No; Patient does not want information on Advanced Directives; Do not resuscitate: No; Living Will: Yes (Copy provided); Medical Power of Attorney: No Physician Affirmation I have reviewed and agree with the above information. Electronic Signature(s) Signed: 09/24/2017 8:11:30 AM  By: Lenda Kelp PA-C Signed: 09/25/2017 3:34:51 PM By: Renne Crigler Entered By: Lenda Kelp on 09/23/2017 08:59:34 [place], [name] ([id number]) -------------------------------------------------------------------------------- SuperBill Details Patient Name: SEBASTHIAN, STAILEY L. Date of Service:  09/23/2017 Medical Record Number: [id number] Patient Account Number: [id number] Date of Birth/Sex: Dec 15, 1950 (67 y.o. 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 I89.0 Lymphedema, not elsewhere classified G70.00 Myasthenia gravis without (acute) exacerbation I87.2 Venous insufficiency (chronic) (peripheral) I73.9 Peripheral vascular disease, unspecified Facility Procedures CPT4 Code Description: 78295621 11042 - DEB SUBQ TISSUE 20 SQ CM/< ICD-10 Diagnosis Description 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

## 2017-09-26 NOTE — Progress Notes (Signed)
MACAI, SISNEROS ([id number]) Visit Report for 09/23/2017 Arrival Information Details Patient Name: [name], [name]. Date of Service: 09/23/2017 8:15 AM Medical Record Number: [id number] Patient Account Number: [id number] Date of Birth/Sex: 16-Nov-1950 (67 y.o. M) Treating RN: [name] Primary Care [name]: [name] Other Clinician: Referring [name]: [name] Treating [name]/Extender: STONE III, Wright Weeks in Treatment: 2 Visit Information History Since Last Visit All ordered tests and consults were completed: No Patient Arrived: Cane Added or deleted any medications: No Arrival Time: 08:21 Any new allergies or adverse reactions: No Accompanied By: self Had a fall or experienced change in No Transfer Assistance: EasyPivot Patient activities of daily living that may affect Lift risk of falls: Patient Identification Verified: Yes Signs or symptoms of abuse/neglect since last visito No Secondary Verification Process Yes Hospitalized since last visit: No Completed: Implantable device outside of the clinic excluding No Patient Requires Transmission-Based No cellular tissue based products placed in the center Precautions: since last visit: Patient Has Alerts: Yes Has Dressing in Place as Prescribed: Yes Patient Alerts: DM II Has Compression in Place as Prescribed: Yes Pain Present Now: No Electronic Signature(s) Signed: 09/23/2017 5:11:46 PM By: [name] Entered By: [name] on 09/23/2017 08:23:02 [name] ([id number]) -------------------------------------------------------------------------------- Encounter Discharge Information Details Patient Name: [name], [name]. Date of Service: 09/23/2017 8:15 AM Medical Record Number: [id number] Patient Account Number: [id number] Date of Birth/Sex: Mar 08, 1951 (67 y.o. M) Treating RN: [name] Primary Care Adaline Trejos: [name] Other Clinician: Referring Jezel Basto: [name] Treating Bonniejean Piano/Extender: [name], Wright Weeks in Treatment: 2 Encounter Discharge Information Items Discharge Condition: Stable Ambulatory Status: Cane Discharge Destination: Home Transportation: Private Auto Accompanied By: self Schedule Follow-up Appointment: Yes Clinical Summary of Care: Electronic Signature(s) Signed: 09/23/2017 9:06:21 AM By: [name] Entered By: [name] on 09/23/2017 09:06:21 [name] ([id number]) -------------------------------------------------------------------------------- Lower Extremity Assessment Details Patient Name: [name], [name]. Date of Service: 09/23/2017 8:15 AM Medical Record Number: [id number] Patient Account Number: [id number] Date of Birth/Sex: 1951-01-26 (67 y.o. M) Treating RN: [name] Primary Care Kimberlynn Lumbra: [name] Other Clinician: Referring Roshana Shuffield: [name] Treating [name]/Extender: STONE III, Wright Weeks in Treatment: 2 Edema Assessment Assessed: [Left: No] [Right: No] [Left: Edema] [Right: :] Calf Left: Right: Point of Measurement: 35 cm From Medial Instep 41 cm cm Ankle Left: Right: Point of Measurement: 14 cm From Medial Instep 22.7 cm cm Vascular Assessment Pulses: Dorsalis Pedis Palpable: [Left:Yes] Posterior Tibial Extremity colors, hair growth, and conditions: Extremity Color: [Left:Hyperpigmented] Temperature of Extremity: [Left:Warm] Capillary Refill: [Left:< 3 seconds] Toe Nail Assessment Left: Right: Thick: No Discolored: No Deformed: No Improper Length and Hygiene: No Electronic Signature(s) Signed: 09/23/2017 5:11:46 PM By: [name] Entered By: [name] on 09/23/2017 08:31:04 [name] ([id number]) -------------------------------------------------------------------------------- Multi Wound Chart Details Patient Name: [name]. Date of Service: 09/23/2017 8:15 AM Medical Record Number: [id number] Patient Account  Number: [id number] Date of Birth/Sex: [birth year] (67 y.o. M) Treating RN: [name] Primary Care Orella Cushman: [name] Other Clinician: Referring [name]: [name] Treating [name]/Extender: STONE III, Wright Weeks in Treatment: 2 Vital Signs Height(in): 72 Pulse(bpm): 63 Weight(lbs): 323.9 Blood Pressure(mmHg): 143/74 Body Mass Index(BMI): 44 Temperature(F): 97.8 Respiratory Rate 18 (breaths/min): Photos: [1:No Photos] [N/A:N/A] Wound Location: [1:Left Lower Leg - Lateral] [N/A:N/A] Wounding Event: [1:Gradually Appeared] [N/A:N/A] Primary Etiology: [1:Diabetic Wound/Ulcer of the Lower Extremity] [N/A:N/A] Comorbid History: [1:Cataracts, Anemia, Lymphedema, Hypertension, Type II Diabetes] [N/A:N/A] Date Acquired: [1:08/26/2017] [N/A:N/A] Weeks of Treatment: [1:2] [N/A:N/A] Wound Status: [1:Open] [N/A:N/A] Measurements  [name] [1:1.9x1.8x0.2] [N/A:N/A] (cm) Area (cm) : [1:2.686] [N/A:N/A] Volume (cm) : [1:0.537] [N/A:N/A] % Reduction in Area: [1:14.30%] [N/A:N/A] % Reduction in Volume: [1:-71.60%] [N/A:N/A] Classification: [1:Grade 1] [N/A:N/A] Exudate Amount: [1:Large] [N/A:N/A] Exudate Type: [1:Serous] [N/A:N/A] Exudate Color: [1:amber] [N/A:N/A] Wound Margin: [1:Flat and Intact] [N/A:N/A] Granulation Amount: [1:Medium (34-66%)] [N/A:N/A] Granulation Quality: [1:Red] [N/A:N/A] Necrotic Amount: [1:Medium (34-66%)] [N/A:N/A] Exposed Structures: [1:Fat Layer (Subcutaneous Tissue) Exposed: Yes Fascia: No Tendon: No Muscle: No Joint: No Bone: No] [N/A:N/A] Epithelialization: [1:None] [N/A:N/A] Periwound Skin Texture: [1:Excoriation: No Induration: No Callus: No Crepitus: No] [N/A:N/A] Rash: No Scarring: No Periwound Skin Moisture: Maceration: No N/A N/A Dry/Scaly: No Periwound Skin Color: Erythema: Yes N/A N/A Atrophie Blanche: No Cyanosis: No Ecchymosis: No Hemosiderin Staining: No Mottled: No Pallor: No Rubor: No Erythema Location:  Circumferential N/A N/A Temperature: No Abnormality N/A N/A Tenderness on Palpation: Yes N/A N/A Wound Preparation: Ulcer Cleansing: N/A N/A Rinsed/Irrigated with Saline, Other: soap and water Topical Anesthetic Applied: Other: lidocaine 4% Treatment Notes Electronic Signature(s) Signed: 09/25/2017 3:34:51 PM By: [name] Entered By: [name] on 09/23/2017 08:40:23 [name], [name] ([id number]) -------------------------------------------------------------------------------- Multi-Disciplinary Care Plan Details Patient Name: [name], TRITSCHLER. Date of Service: 09/23/2017 8:15 AM Medical Record Number: [id number] Patient Account Number: [id number] Date of Birth/Sex: [birth year] (66 y.o. M) Treating RN: [name] Primary Care Chaka Jefferys: [name] Other Clinician: Referring Tung Pustejovsky: [name] Treating Veronica Fretz/Extender: STONE III, Wright Weeks in Treatment: 2 Active Inactive ` Orientation to the Wound Care Program Nursing Diagnoses: Knowledge deficit related to the wound healing center program Goals: Patient/caregiver will verbalize understanding of the Wound Healing Center Program Date Initiated: 09/09/2017 Target Resolution Date: 09/30/2017 Goal Status: Active Interventions: Provide education on orientation to the wound center Notes: ` Wound/Skin Impairment Nursing Diagnoses: Impaired tissue integrity Goals: Patient/caregiver will verbalize understanding of skin care regimen Date Initiated: 09/09/2017 Target Resolution Date: 09/28/2017 Goal Status: Active Ulcer/skin breakdown will have a volume reduction of 30% by week 4 Date Initiated: 09/09/2017 Target Resolution Date: 09/28/2017 Goal Status: Active Interventions: Assess patient/caregiver ability to obtain necessary supplies Assess patient/caregiver ability to perform ulcer/skin care regimen upon admission and as needed Assess ulceration(s) every visit Treatment Activities: Skin care regimen  initiated : 09/09/2017 Notes: Electronic Signature(s) Signed: 09/25/2017 3:34:51 PM By: Sofie Rower, [name] ([id number]) Entered By: [name] on 09/23/2017 08:40:17 [name] ([id number]) -------------------------------------------------------------------------------- Pain Assessment Details Patient Name: [name], [name]. Date of Service: 09/23/2017 8:15 AM Medical Record Number: [id number] Patient Account Number: [id number] Date of Birth/Sex: [birth year] (66 y.o. M) Treating RN: [name] Primary Care Hermine Feria: [name] Other Clinician: Referring Tanzania Basham: [name] Treating Lynne Takemoto/Extender: STONE III, Wright Weeks in Treatment: 2 Active Problems Location of Pain Severity and Description of Pain Patient Has Paino No Site Locations Pain Management and Medication Current Pain Management: Electronic Signature(s) Signed: 09/23/2017 5:11:46 PM By: [name] Entered By: [name] on 09/23/2017 08:23:16 [name] ([id number]) -------------------------------------------------------------------------------- Patient/Caregiver Education Details Patient Name: [name], [name]. Date of Service: 09/23/2017 8:15 AM Medical Record Number: [id number] Patient Account Number: [id number] Date of Birth/Gender: 05-Aug-1950 (66 y.o. M) Treating RN: [name] Primary Care Physician: [name] Other Clinician: Referring Physician: Leotis Wright Treating Physician/Extender: Skeet Simmer in Treatment: 2 Education Assessment Education Provided To: Patient Education Topics Provided Venous: Handouts: Other: wrap precautions Methods: Explain/Verbal Responses: State content correctly Electronic Signature(s) Signed: 09/23/2017 5:27:38 PM By: [name] Entered By: [name] on 09/23/2017 09:06:58 Braid, [name]  ([id number]) --------------------------------------------------------------------------------  Wound Assessment Details Patient Name: [name], [name]. Date of Service: 09/23/2017 8:15 AM Medical Record Number: [id number] Patient Account Number: [id number] Date of Birth/Sex: 1950/06/03 (66 y.o. M) Treating RN: Douglas HaggisPinkerton, Debi Primary Care Edyn Qazi: Douglas ShamesSINGH, JASMINE Other Clinician: Referring Collin Hendley: Douglas ShamesSINGH, JASMINE Treating Brookes Craine/Extender: STONE III, Wright Weeks in Treatment: 2 Wound Status Wound Number: 1 Primary Diabetic Wound/Ulcer of the Lower Extremity Etiology: Wound Location: Left Lower Leg - Lateral Wound Status: Open Wounding Event: Gradually Appeared Comorbid Cataracts, Anemia, Lymphedema, Date Acquired: 08/26/2017 History: Hypertension, Type II Diabetes Weeks Of Treatment: 2 Clustered Wound: No Photos Photo Uploaded By: Elliot GurneyWoody, BSN, RN, CWS, Kim on 09/23/2017 14:35:09 Wound Measurements Length: (cm) 1.9 Width: (cm) 1.8 Depth: (cm) 0.2 Area: (cm) 2.686 Volume: (cm) 0.537 % Reduction in Area: 14.3% % Reduction in Volume: -71.6% Epithelialization: None Tunneling: No Undermining: No Wound Description Classification: Grade 1 Wound Margin: Flat and Intact Exudate Amount: Large Exudate Type: Serous Exudate Color: amber Foul Odor After Cleansing: No Slough/Fibrino Yes Wound Bed Granulation Amount: Medium (34-66%) Exposed Structure Granulation Quality: Red Fascia Exposed: No Necrotic Amount: Medium (34-66%) Fat Layer (Subcutaneous Tissue) Exposed: Yes Necrotic Quality: Adherent Slough Tendon Exposed: No Muscle Exposed: No Joint Exposed: No Bone Exposed: No Periwound Skin Texture [name], [name]. ([id number]) Texture Color No Abnormalities Noted: No No Abnormalities Noted: No Callus: No Atrophie Blanche: No Crepitus: No Cyanosis: No Excoriation: No Ecchymosis: No Induration: No Erythema: Yes Rash: No Erythema Location: Circumferential Scarring:  No Hemosiderin Staining: No Mottled: No Moisture Pallor: No No Abnormalities Noted: No Rubor: No Dry / Scaly: No Maceration: No Temperature / Pain Temperature: No Abnormality Tenderness on Palpation: Yes Wound Preparation Ulcer Cleansing: Rinsed/Irrigated with Saline, Other: soap and water, Topical Anesthetic Applied: Other: lidocaine 4%, Treatment Notes Wound #1 (Left, Lateral Lower Leg) 1. Cleansed with: Cleanse wound with antibacterial soap and water 2. Anesthetic Topical Lidocaine 4% cream to wound bed prior to debridement 4. Dressing Applied: Iodoflex 5. Secondary Dressing Applied ABD Pad 7. Secured with 3 Layer Compression System - Left Lower Extremity Notes unna to anchor; Electronic Signature(s) Signed: 09/23/2017 5:11:46 PM By: Douglas MullingPinkerton, Debra Entered By: Douglas MullingPinkerton, Debra on 09/23/2017 08:34:36 [name], [name]. ([id number]) -------------------------------------------------------------------------------- Vitals Details Patient Name: Douglas HuhHUNTER, Gildardo Wright. Date of Service: 09/23/2017 8:15 AM Medical Record Number: [id number] Patient Account Number: [id number] Date of Birth/Sex: 1950/06/03 (66 y.o. M) Treating RN: Douglas HaggisPinkerton, Debi Primary Care Keelon Zurn: Douglas ShamesSINGH, JASMINE Other Clinician: Referring Tallon Gertz: Douglas ShamesSINGH, JASMINE Treating Posey Petrik/Extender: STONE III, Wright Weeks in Treatment: 2 Vital Signs Time Taken: 08:23 Temperature (F): 97.8 Height (in): 72 Pulse (bpm): 63 Weight (lbs): 323.9 Respiratory Rate (breaths/min): 18 Body Mass Index (BMI): 43.9 Blood Pressure (mmHg): 143/74 Reference Range: 80 - 120 mg / dl Electronic Signature(s) Signed: 09/23/2017 5:11:46 PM By: Douglas MullingPinkerton, Debra Entered By: Douglas MullingPinkerton, Debra on 09/23/2017 08:29:43

## 2017-10-01 ENCOUNTER — Encounter: Payer: BLUE CROSS/BLUE SHIELD | Admitting: Physician Assistant

## 2017-10-01 DIAGNOSIS — E11622 Type 2 diabetes mellitus with other skin ulcer: Secondary | ICD-10-CM | POA: Diagnosis not present

## 2017-10-02 NOTE — Progress Notes (Signed)
[name] ([id number]) Visit Report for 10/01/2017 Chief Complaint Document Details Patient Name: [name], [name]. Date of Service: 10/01/2017 8:00 AM Medical Record Number: [id number] Patient Account Number: [id number] Date of Birth/Sex: 1950/07/06 (67 y.o. M) Treating RN: Phillis Haggis Primary Care Provider: Leotis Shames Other Clinician: Referring Provider: Leotis Shames Treating Provider/Extender: Linwood Dibbles, HOYT Weeks in Treatment: 3 Information Obtained from: Patient Chief Complaint Left leg ulcer Electronic Signature(s) Signed: 10/01/2017 11:53:57 PM By: Lenda Kelp PA-C Entered By: Lenda Kelp on 10/01/2017 08:13:22 SOHAIL, CAPRARO ([id number]) -------------------------------------------------------------------------------- Debridement Details Patient Name: [name], [name] Date of Service: 10/01/2017 8:00 AM Medical Record Number: [id number] Patient Account Number: [id number] Date of Birth/Sex: [birth year] (67 y.o. M) Treating RN: Phillis Haggis Primary Care Provider: Leotis Shames Other Clinician: Referring Provider: Leotis Shames Treating Provider/Extender: STONE III, HOYT Weeks in Treatment: 3 Debridement Performed for Wound #1 Left,Lateral Lower Leg Assessment: Performed By: Physician STONE III, HOYT E., PA-C Debridement Type: Debridement Severity of Tissue Pre Fat layer exposed Debridement: Pre-procedure Verification/Time Yes - 08:32 Out Taken: Start Time: 08:32 Pain Control: Lidocaine 4% Topical Solution Total Area Debrided (L x W): 0.4 (cm) x 0.5 (cm) = 0.2 (cm) Tissue and other material Viable, Non-Viable, Slough, Subcutaneous, Fibrin/Exudate, Slough debrided: Level: Skin/Subcutaneous Tissue Debridement Description: Excisional Instrument: Curette Bleeding: Minimum Hemostasis Achieved: Pressure End Time: 08:34 Procedural Pain: 0 Post Procedural Pain: 0 Response to Treatment: Procedure was tolerated well Level of  Consciousness: Awake and Alert Post Debridement Measurements of Total Wound Length: (cm) 1.5 Width: (cm) 1.5 Depth: (cm) 0.3 Volume: (cm) 0.53 Character of Wound/Ulcer Post Debridement: Requires Further Debridement Severity of Tissue Post Debridement: Fat layer exposed Post Procedure Diagnosis Same as Pre-procedure Electronic Signature(s) Signed: 10/01/2017 5:13:20 PM By: Alejandro Mulling Signed: 10/01/2017 11:53:57 PM By: Lenda Kelp PA-C Entered By: Alejandro Mulling on 10/01/2017 08:34:33 Croson, [name] ([id number]) -------------------------------------------------------------------------------- HPI Details Patient Name: [name], [name] Date of Service: 10/01/2017 8:00 AM Medical Record Number: [id number] Patient Account Number: [id number] Date of Birth/Sex: Jul 27, 1950 (67 y.o. M) Treating RN: Phillis Haggis Primary Care Provider: Leotis Shames Other Clinician: Referring Provider: Leotis Shames Treating Provider/Extender: STONE III, HOYT Weeks in Treatment: 3 History of Present Illness HPI Description: MYASTHENIA GRAVIS (MG) MEDICATION ALERT: Medications that increase weakness in most MG patients should be avoided or used only with great caution. These include: - Fluoroquinolone antibiotics: moxifloxacin (Aveloxo), ciprofloxacin (Ciproo, Proquin XRo), norfloxacin (Noroxino), levofloxacin (Levaquino), ofloxacin (Floxino), gemifloxacin (Factiveo) - Aminoglycoside antibiotics: tobramycin, gentamycin, kanamycin, neomycin, streptomycin - Macrolide antibiotics: erythromycin, azithromycin (Z-Pako, Zithromaxo), telithromycin (Keteko) - Botulinum toxins: Botoxo, Myobloco, Dysporto, Xeomino - Beta-blockers such as propanolol or timolol eyedrops (Timoptico) - Calcium channel blockers (blood pressure medications) - Quinine, quinidine or procainamide - D-penicillamine (do not confuse with penicillin) - Interferon - Magnesium salts: milk of magnesia, some antacids (Maaloxo,  Mylantao) - Curare and related drugs (usually used only during surgery) Other medications may produce problems in some MG patients. Be sure your doctor/dentist knows you have MG when any new medication is prescribed. Contact your myasthenia doctor if you or your local doctor has questions about medications. 09/09/17 on evaluation today patient actually appears for initial evaluation today concerning his left lateral lower extremity ulcer which has been present he tells me since November 2018. He states that gas logs that he'd forgotten he had said somewhere actually fell over landing on his leg causing the injury and unfortunately the left leg has never fully recovered since that time. He does note that  there was some improvement initially and now it's pretty much stagnant. He does have venous stasis chronically although he states it's been worse and pass compared to current. He has also been diagnosed with lymphedema although again this is something else that has been deemed the worst in the past compared to present. He does have lymphedema pumps although he really has not been using those on a regular basis. Patient does have diabetes mellitus type II in other than the venous insufficiency and prayerful vascular disease he also has myasthenia gravis board she is on medications long-term. Currently he has been tolerating the medications for this very well he does have a list as noted above in the HPI of medications that he is to avoid that we need to keep in mind. Nonetheless the patient seems to be doing fairly well in general. I do believe his peripheral edema may be contributing to the nonhealing factor in regard to this ulcer. 09/16/17 on evaluation today patient appears to be doing rather well his wound actually appears to show signs of improvement compared to previous evaluation which is great news. Overall his wound is a little larger but again I did debride the wound sharply last week so this  is definitely expected as far as I was concerned. Nonetheless the wound bed itself seems to be doing excellent. He is having no discomfort normally he does have a little bit of pain with cleansing of the wound by myself today. 09/23/17 on evaluation today patient actually appears to be showing signs of good improvement in regard to his left lateral motion the ulcer. He's been tolerating the dressing changes without complication. Fortunately there does not appear to be any evidence of infection at this point which is great news. Overall I'm extremely happy that he seems to be doing so well and the patient likewise wants to be healed as soon as possible he actually has a pull at home he would love to be in but he hasn't been able to get into this due to what's going on currently. 10/01/16 on evaluation today patient appears to be doing rather well in regard to his left lateral lower Trinity ulcer. He's been tolerating the dressing changes without complication. There does not appear to be any evidence of infection at this time. Overall I'm very pleased with the progress that has been made up to this point. I do believe after today will likely be able to switch to a different dressing. Vassie LollHUNTER, Kanav L. ([id number]) Electronic Signature(s) Signed: 10/01/2017 11:53:57 PM By: Lenda KelpStone III, Hoyt PA-C Entered By: Lenda KelpStone III, Hoyt on 10/01/2017 08:38:12 Vassie LollHUNTER, Rey L. ([id number]) -------------------------------------------------------------------------------- Physical Exam Details Patient Name: Vassie LollHUNTER, Garfield L. Date of Service: 10/01/2017 8:00 AM Medical Record Number: [id number] Patient Account Number: [id number] Date of Birth/Sex: 1950-06-01 (67 y.o. M) Treating RN: Phillis HaggisPinkerton, Debi Primary Care Provider: Leotis ShamesSINGH, JASMINE Other Clinician: Referring Provider: Leotis ShamesSINGH, JASMINE Treating Provider/Extender: STONE III, HOYT Weeks in Treatment: 3 Constitutional Well-nourished and well-hydrated in no acute  distress. Respiratory normal breathing without difficulty. Cardiovascular trace pitting edema of the bilateral lower extremities. Psychiatric this patient is able to make decisions and demonstrates good insight into disease process. Alert and Oriented x 3. pleasant and cooperative. Notes Patient's wound bed shows evidence of good granulation there was a slight area of slough noted on the lateral/superior region that we been working on and he has done very well in this regard. Nonetheless it did require sharp debridement today I was able to debride this away  and post debridement the wound bed appears to be doing much better. Overall I'm very pleased in this regard with the appearance. Fortunately his wrap seems to be controlling his swelling very well. Electronic Signature(s) Signed: 10/01/2017 11:53:57 PM By: Lenda Kelp PA-C Entered By: Lenda Kelp on 10/01/2017 08:39:12 SAMWISE, ECKARDT ([id number]) -------------------------------------------------------------------------------- Physician Orders Details Patient Name: MCCOY, TESTA. Date of Service: 10/01/2017 8:00 AM Medical Record Number: [id number] Patient Account Number: [id number] Date of Birth/Sex: 1950/09/11 (67 y.o. M) Treating RN: Phillis Haggis Primary Care Provider: Leotis Shames Other Clinician: Referring Provider: Leotis Shames Treating Provider/Extender: STONE III, HOYT Weeks in Treatment: 3 Verbal / Phone Orders: Yes Clinician: Pinkerton, Debi Read Back and Verified: Yes Diagnosis Coding ICD-10 Coding Code Description E11.622 Type 2 diabetes mellitus with other skin ulcer 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 of other part of left lower leg with fat layer exposed I89.0 Lymphedema, not elsewhere classified G70.00 Myasthenia gravis without (acute) exacerbation I87.2 Venous insufficiency (chronic) (peripheral) I73.9 Peripheral vascular disease, unspecified Wound Cleansing Wound #1 Left,Lateral Lower  Leg o Clean wound with Normal Saline. Anesthetic (add to Medication List) Wound #1 Left,Lateral Lower Leg o Topical Lidocaine 4% cream applied to wound bed prior to debridement (In Clinic Only). Skin Barriers/Peri-Wound Care Wound #1 Left,Lateral Lower Leg o Moisturizing lotion Primary Wound Dressing Wound #1 Left,Lateral Lower Leg o Silver Collagen Secondary Dressing Wound #1 Left,Lateral Lower Leg o ABD pad Dressing Change Frequency Wound #1 Left,Lateral Lower Leg o Dressing is to be changed Monday and Thursday. Follow-up Appointments Wound #1 Left,Lateral Lower Leg o Return Appointment in 1 week. o Nurse Visit as needed Edema Control Wound #1 Left,Lateral Lower Leg MYKA, LUKINS. ([id number]) o 3 Layer Compression System - Left Lower Extremity - unna to anchor o Compression Pump: Use compression pump on left lower extremity for 30 minutes, twice daily. - work up to 1 hour twice daily o Compression Pump: Use compression pump on right lower extremity for 30 minutes, twice daily. - work up to 1 hour twice daily Additional Orders / Instructions Wound #1 Left,Lateral Lower Leg o Vitamin A; Vitamin C, Zinc Patient Medications Allergies: penicillin, Omnicef Notifications Medication Indication Start End lidocaine DOSE 1 - topical 4 % cream - 1 cream topical Electronic Signature(s) Signed: 10/01/2017 5:13:20 PM By: Alejandro Mulling Signed: 10/01/2017 11:53:57 PM By: Lenda Kelp PA-C Entered By: Alejandro Mulling on 10/01/2017 08:35:26 BARNELL, SHIEH ([id number]) -------------------------------------------------------------------------------- Prescription 10/01/2017 Patient Name: [name]. Provider: Lenda Kelp PA-C Date of Birth: 1950-07-23 NPI#: [id number] Sex: Judie Petit DEA#: [id number] Phone #: [phone] License #: Patient Address: [facility] [address] [facility] [place], [place] [address] [address], [address] [place], [place] [address] [phone] Allergies penicillin Omnicef Medication Medication: Route: Strength: Form: lidocaine 4 % topical cream topical 4% cream Class: TOPICAL LOCAL ANESTHETICS Dose: Frequency / Time: Indication: 1 1 cream topical Number of Refills: Number of Units: 0 Generic Substitution: Start Date: End Date: One Time Use: Substitution Permitted No Note to Pharmacy: Signature(s): Date(s): Electronic Signature(s) Signed: 10/01/2017 5:13:20 PM By: Alejandro Mulling Signed: 10/01/2017 11:53:57 PM By: Lenda Kelp PA-C Entered By: Alejandro Mulling on 10/01/2017 08:35:27 ROSALIE, GELPI ([id number]) TAIKI, BUCKWALTER ([id number]) --------------------------------------------------------------------------------  Problem List Details Patient Name: MAHIN, GUARDIA. Date of Service: 10/01/2017 8:00 AM Medical Record Number: [id number] Patient Account Number: [id number] Date of Birth/Sex: 1950-12-31 (67 y.o. M) Treating RN: Phillis Haggis Primary Care Provider: Leotis Shames Other  Clinician: Referring Provider: Leotis Shames Treating Provider/Extender: Linwood Dibbles, HOYT Weeks in Treatment: 3 Active Problems ICD-10 Evaluated Encounter Code Description Active Date Today Diagnosis E11.622 Type 2 diabetes mellitus with other skin ulcer 09/09/2017 No Yes 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 of other part of left lower leg with 09/09/2017 No Yes fat layer exposed I89.0 Lymphedema, not elsewhere classified 09/09/2017 No Yes G70.00 Myasthenia gravis without (acute) exacerbation 09/09/2017 No Yes I87.2 Venous insufficiency (chronic) (peripheral) 09/09/2017 No Yes I73.9 Peripheral vascular disease, unspecified 09/09/2017 No Yes Inactive Problems Resolved Problems Electronic Signature(s) Signed: 10/01/2017 11:53:57 PM By: Lenda Kelp PA-C Entered By: Lenda Kelp on 10/01/2017 08:10:53 Sciascia, [name]  ([id number]) -------------------------------------------------------------------------------- Progress Note Details Patient Name: [name]. Date of Service: 10/01/2017 8:00 AM Medical Record Number: [id number] Patient Account Number: [id number] Date of Birth/Sex: Nov 14, 1950 (67 y.o. M) Treating RN: Phillis Haggis Primary Care Provider: Leotis Shames Other Clinician: Referring Provider: Leotis Shames Treating Provider/Extender: STONE III, HOYT Weeks in Treatment: 3 Subjective Chief Complaint Information obtained from Patient Left leg ulcer History of Present Illness (HPI) MYASTHENIA GRAVIS (MG) MEDICATION ALERT: Medications that increase weakness in most MG patients should be avoided or used only with great caution. These include: - Fluoroquinolone antibiotics: moxifloxacin (Avelox), ciprofloxacin (Cipro, Proquin XR), norfloxacin (Noroxin), levofloxacin (Levaquin), ofloxacin (Floxin), gemifloxacin (Factive) - Aminoglycoside antibiotics: tobramycin, gentamycin, kanamycin, neomycin, streptomycin - Macrolide antibiotics: erythromycin, azithromycin (Z-Pak, Zithromax), telithromycin (Ketek) - Botulinum toxins: Botox, Myobloc, Dysport, Xeomin - Beta-blockers such as propanolol or timolol eyedrops (Timoptic) - Calcium channel blockers (blood pressure medications) - Quinine, quinidine or procainamide - D-penicillamine (do not confuse with penicillin) - Interferon - Magnesium salts: milk of magnesia, some antacids (Maalox, Mylanta) - Curare and related drugs (usually used only during surgery) Other medications may produce problems in some MG patients. Be sure your doctor/dentist knows you have MG when any new medication is prescribed. Contact your myasthenia doctor if you or your local doctor has questions about medications. 09/09/17 on evaluation today patient actually appears for initial evaluation today concerning his left lateral lower extremity  ulcer which has been present he tells me since November 2018. He states that gas logs that he'd forgotten he had said somewhere actually fell over landing on his leg causing the injury and unfortunately the left leg has never fully recovered since that time. He does note that there was some improvement initially and now it's pretty much stagnant. He does have venous stasis chronically although he states it's been worse and pass compared to current. He has also been diagnosed with lymphedema although again this is something else that has been deemed the worst in the past compared to present. He does have lymphedema pumps although he really has not been using those on a regular basis. Patient does have diabetes mellitus type II in other than the venous insufficiency and prayerful vascular disease he also has myasthenia gravis board she is on medications long-term. Currently he has been tolerating the medications for this very well he does have a list as noted above in the HPI of medications that he is to avoid that we need to keep in mind. Nonetheless the patient seems to be doing fairly well in general. I do believe his peripheral edema may be contributing to the nonhealing factor in regard to this ulcer. 09/16/17 on evaluation today patient appears to be doing rather well his wound actually appears to show signs of improvement compared to previous evaluation which is great news. Overall his wound is a  little larger but again I did debride the wound sharply last week so this is definitely expected as far as I was concerned. Nonetheless the wound bed itself seems to be doing excellent. He is having no discomfort normally he does have a little bit of pain with cleansing of the wound by myself today. 09/23/17 on evaluation today patient actually appears to be showing signs of good improvement in regard to his left lateral motion the ulcer. He's been tolerating the dressing changes without complication.  Fortunately there does not appear to be any evidence of infection at this point which is great news. Overall I'm extremely happy that he seems to be doing so well and the patient likewise wants to be healed as soon as possible he actually has a pull at home he would love to be in but he hasn't DAYMION, NAZAIRE L. ([id number]) been able to get into this due to what's going on currently. 10/01/16 on evaluation today patient appears to be doing rather well in regard to his left lateral lower Trinity ulcer. He's been tolerating the dressing changes without complication. There does not appear to be any evidence of infection at this time. Overall I'm very pleased with the progress that has been made up to this point. I do believe after today will likely be able to switch to a different dressing. Patient History Information obtained from Patient. Family History Cancer - Father, Heart Disease - Father, No family history of Diabetes, Hereditary Spherocytosis, Hypertension, Kidney Disease, Lung Disease, Seizures, Stroke, Thyroid Problems, Tuberculosis. Social History Never smoker, Marital Status - Married, Alcohol Use - Never, Drug Use - No History, Caffeine Use - Daily. Review of Systems (ROS) Constitutional Symptoms (General Health) Denies complaints or symptoms of Fever, Chills. Respiratory The patient has no complaints or symptoms. Cardiovascular Complains or has symptoms of LE edema. Psychiatric The patient has no complaints or symptoms. Objective Constitutional Well-nourished and well-hydrated in no acute distress. Vitals Time Taken: 8:14 AM, Height: 72 in, Weight: 323.9 lbs, BMI: 43.9, Temperature: 97.7 F, Pulse: 63 bpm, Respiratory Rate: 18 breaths/min, Blood Pressure: 143/62 mmHg. Respiratory normal breathing without difficulty. Cardiovascular trace pitting edema of the bilateral lower extremities. Psychiatric this patient is able to make decisions and demonstrates good insight into  disease process. Alert and Oriented x 3. pleasant and cooperative. General Notes: Patient's wound bed shows evidence of good granulation there was a slight area of slough noted on the lateral/superior region that we been working on and he has done very well in this regard. Nonetheless it did require sharp Lama, Donaven L. ([id number]) debridement today I was able to debride this away and post debridement the wound bed appears to be doing much better. Overall I'm very pleased in this regard with the appearance. Fortunately his wrap seems to be controlling his swelling very well. Integumentary (Hair, Skin) Wound #1 status is Open. Original cause of wound was Gradually Appeared. The wound is located on the Left,Lateral Lower Leg. The wound measures 1.5cm length x 1.5cm width x 0.2cm depth; 1.767cm^2 area and 0.353cm^3 volume. There is Fat Layer (Subcutaneous Tissue) Exposed exposed. There is no tunneling or undermining noted. There is a medium amount of serous drainage noted. The wound margin is flat and intact. There is large (67-100%) red, pink granulation within the wound bed. There is a small (1-33%) amount of necrotic tissue within the wound bed including Adherent Slough. The periwound skin appearance did not exhibit: Callus, Crepitus, Excoriation, Induration, Rash, Scarring, Dry/Scaly, Maceration, Atrophie Blanche,  Cyanosis, Ecchymosis, Hemosiderin Staining, Mottled, Pallor, Rubor, Erythema. Periwound temperature was noted as No Abnormality. The periwound has tenderness on palpation. Assessment Active Problems ICD-10 Type 2 diabetes mellitus with other skin ulcer Non-pressure chronic ulcer of other part of left lower leg with fat layer exposed Lymphedema, not elsewhere classified Myasthenia gravis without (acute) exacerbation Venous insufficiency (chronic) (peripheral) Peripheral vascular disease, unspecified Procedures Wound #1 Pre-procedure diagnosis of Wound #1 is a Diabetic  Wound/Ulcer of the Lower Extremity located on the Left,Lateral Lower Leg .Severity of Tissue Pre Debridement is: Fat layer exposed. There was a Excisional Skin/Subcutaneous Tissue Debridement with a total area of 0.2 sq cm performed by STONE III, HOYT E., PA-C. With the following instrument(s): Curette to remove Viable and Non-Viable tissue/material. Material removed includes Subcutaneous Tissue, Slough, and Fibrin/Exudate after achieving pain control using Lidocaine 4% Topical Solution. No specimens were taken. A time out was conducted at 08:32, prior to the start of the procedure. A Minimum amount of bleeding was controlled with Pressure. The procedure was tolerated well with a pain level of 0 throughout and a pain level of 0 following the procedure. Patient s Level of Consciousness post procedure was recorded as Awake and Alert. Post Debridement Measurements: 1.5cm length x 1.5cm width x 0.3cm depth; 0.53cm^3 volume. Character of Wound/Ulcer Post Debridement requires further debridement. Severity of Tissue Post Debridement is: Fat layer exposed. Post procedure Diagnosis Wound #1: Same as Pre-Procedure Plan Wound Cleansing: JALIN, ALICEA. ([id number]) Wound #1 Left,Lateral Lower Leg: Clean wound with Normal Saline. Anesthetic (add to Medication List): Wound #1 Left,Lateral Lower Leg: Topical Lidocaine 4% cream applied to wound bed prior to debridement (In Clinic Only). Skin Barriers/Peri-Wound Care: Wound #1 Left,Lateral Lower Leg: Moisturizing lotion Primary Wound Dressing: Wound #1 Left,Lateral Lower Leg: Silver Collagen Secondary Dressing: Wound #1 Left,Lateral Lower Leg: ABD pad Dressing Change Frequency: Wound #1 Left,Lateral Lower Leg: Dressing is to be changed Monday and Thursday. Follow-up Appointments: Wound #1 Left,Lateral Lower Leg: Return Appointment in 1 week. Nurse Visit as needed Edema Control: Wound #1 Left,Lateral Lower Leg: 3 Layer Compression System -  Left Lower Extremity - unna to anchor Compression Pump: Use compression pump on left lower extremity for 30 minutes, twice daily. - work up to 1 hour twice daily Compression Pump: Use compression pump on right lower extremity for 30 minutes, twice daily. - work up to 1 hour twice daily Additional Orders / Instructions: Wound #1 Left,Lateral Lower Leg: Vitamin A; Vitamin C, Zinc The following medication(s) was prescribed: lidocaine topical 4 % cream 1 1 cream topical was prescribed at facility At this point we are going to switch to the Prisma as the dressing of choice going forward and the patient is in agreement with this plan. We will subsequently see were things stand at follow-up in one weeks time with distressing change. Please see above for specific wound care orders. We will see patient for re-evaluation in 1 week(s) here in the clinic. If anything worsens or changes patient will contact our office for additional recommendations. Electronic Signature(s) Signed: 10/01/2017 11:53:57 PM By: Lenda Kelp PA-C Entered By: Lenda Kelp on 10/01/2017 08:39:59 ELIYOHU, CLASS ([id number]) -------------------------------------------------------------------------------- ROS/PFSH Details Patient Name: NORIS, KULINSKI. Date of Service: 10/01/2017 8:00 AM Medical Record Number: [id number] Patient Account Number: [id number] Date of Birth/Sex: 07/14/1950 (67 y.o. M) Treating RN: Phillis Haggis Primary Care Provider: Leotis Shames Other Clinician: Referring Provider: Leotis Shames Treating Provider/Extender: STONE III, HOYT Weeks in Treatment: 3 Information Obtained From Patient  Wound History Do you currently have one or more open woundso Yes How many open wounds do you currently haveo 1 Approximately how long have you had your woundso nov 2018 How have you been treating your wound(s) until nowo band-aide, neo sporinHas your wound(s) ever healed and then re-openedo No Have you had  any lab work done in the past montho No Have you tested positive for an antibiotic resistant organism (MRSA, VRE)o No Have you tested positive for osteomyelitis (bone infection)o No Have you had any tests for circulation on your legso No Have you had other problems associated with your woundso Swelling Constitutional Symptoms (General Health) Complaints and Symptoms: Negative for: Fever; Chills Cardiovascular Complaints and Symptoms: Positive for: LE edema Medical History: Positive for: Hypertension Eyes Medical History: Positive for: Cataracts - surgery Hematologic/Lymphatic Medical History: Positive for: Anemia; Lymphedema Respiratory Complaints and Symptoms: No Complaints or Symptoms Endocrine Medical History: Positive for: Type II Diabetes Time with diabetes: 15 yrs Treated with: Insulin, Oral agents RAHKEEM, SENFT ([id number]) Blood sugar tested every day: Yes Tested : Psychiatric Complaints and Symptoms: No Complaints or Symptoms HBO Extended History Items Eyes: Cataracts Immunizations Pneumococcal Vaccine: Received Pneumococcal Vaccination: Yes Implantable Devices Family and Social History Cancer: Yes - Father; Diabetes: No; Heart Disease: Yes - Father; Hereditary Spherocytosis: No; Hypertension: No; Kidney Disease: No; Lung Disease: No; Seizures: No; Stroke: No; Thyroid Problems: No; Tuberculosis: No; Never smoker; Marital Status - Married; Alcohol Use: Never; Drug Use: No History; Caffeine Use: Daily; Financial Concerns: No; Food, Clothing or Shelter Needs: No; Support System Lacking: No; Transportation Concerns: No; Advanced Directives: No; Patient does not want information on Advanced Directives; Do not resuscitate: No; Living Will: Yes (Copy provided); Medical Power of Attorney: No Physician Affirmation I have reviewed and agree with the above information. Electronic Signature(s) Signed: 10/01/2017 5:13:20 PM By: Alejandro Mulling Signed: 10/01/2017  11:53:57 PM By: Lenda Kelp PA-C Entered By: Lenda Kelp on 10/01/2017 08:38:33 MAGID, [name] ([id number]) -------------------------------------------------------------------------------- SuperBill Details Patient Name: ELIS, SAUBER. Date of Service: 10/01/2017 Medical Record Number: [id number] Patient Account Number: [id number] Date of Birth/Sex: [birth year] (67 y.o. M) Treating RN: Phillis Haggis Primary Care Provider: Leotis Shames Other Clinician: Referring Provider: Leotis Shames Treating Provider/Extender: STONE III, HOYT Weeks in Treatment: 3 Diagnosis Coding ICD-10 Codes Code Description E11.622 Type 2 diabetes mellitus with other skin ulcer 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 of other part of left lower leg with fat layer exposed I89.0 Lymphedema, not elsewhere classified G70.00 Myasthenia gravis without (acute) exacerbation I87.2 Venous insufficiency (chronic) (peripheral) I73.9 Peripheral vascular disease, unspecified Facility Procedures CPT4 Code Description: 08657846 11042 - DEB SUBQ TISSUE 20 SQ CM/< ICD-10 Diagnosis Description 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 of other part of left lower leg with Modifier: fat layer expos Quantity: 1 ed Physician Procedures CPT4 Code Description: 9629528 11042 - WC PHYS SUBQ TISS 20 SQ CM ICD-10 Diagnosis Description 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 of other part of left lower leg with Modifier: fat layer expos Quantity: 1 ed Electronic Signature(s) Signed: 10/01/2017 11:53:57 PM By: Lenda Kelp PA-C Entered By: Lenda Kelp on 10/01/2017 08:40:09

## 2017-10-02 NOTE — Progress Notes (Signed)
[name], Douglas L. ([id number]) Visit Report for 10/01/2017 Arrival Information Details Patient Name: [name], Douglas L. Date of Service: 10/01/2017 8:00 AM Medical Record Number: [id number] Patient Account Number: [id number] Date of Birth/Sex: [birth year] (66 y.o. M) Treating RN: [name] Primary Care [name]: [name], Douglas Other Clinician: Referring [name]: [name], Douglas Treating [name]/Extender: STONE Wright, [name]: 3 Visit Information History Since Last Visit All ordered tests and consults were completed: No Patient Arrived: Cane Added or deleted any medications: No Arrival Time: 08:13 Any new allergies or adverse reactions: No Accompanied By: self Had a fall or experienced change in No Transfer Assistance: None activities of daily living that may affect Patient Identification Verified: Yes risk of falls: Secondary Verification Process Completed: Yes Signs or symptoms of abuse/neglect since last visito No Patient Requires Transmission-Based Precautions: No Hospitalized since last visit: No Patient Has Alerts: Yes Implantable device outside of the clinic excluding No Patient Alerts: DM II cellular tissue based products placed in the center since last visit: Pain Present Now: No Electronic Signature(s) Signed: 10/01/2017 11:46:23 AM By: [name] Entered By: [name] on 10/01/2017 08:13:51 [name], Kymari L. ([id number]) -------------------------------------------------------------------------------- Encounter Discharge Information Details Patient Name: [name], Douglas L. Date of Service: 10/01/2017 8:00 AM Medical Record Number: [id number] Patient Account Number: [id number] Date of Birth/Sex: [birth year] (66 y.o. M) Treating RN: [name], Douglas Primary Care Averlee Swartz: [name], Douglas Other Clinician: Referring Cearra Portnoy: [name], Douglas Treating Arlisa Leclere/Extender: [name], [name]: 3 Encounter Discharge Information  Items Discharge Condition: Stable Ambulatory Status: Ambulatory Discharge Destination: Home Transportation: Private Auto Accompanied By: self Schedule Follow-up Appointment: Yes Clinical Summary of Care: Electronic Signature(s) Signed: 10/01/2017 5:13:20 PM By: [name], Debra Entered By: [name], Debra on 10/01/2017 08:46:19 Endsley, [name] -------------------------------------------------------------------------------- Lower Extremity Assessment Details Patient Name: [name], Douglas L. Date of Service: 10/01/2017 8:00 AM Medical Record Number: [id number] Patient Account Number: [id number] Date of Birth/Sex: [birth year] (66 y.o. M) Treating RN: [name] Primary Care Shine Scrogham: [name], Douglas Other Clinician: Referring Norrine Ballester: [name], Douglas Treating Shakeila Pfarr/Extender: STONE Wright, [name]: 3 Edema Assessment Assessed: [Left: No] [Right: No] Edema: [Left: N] [Right: o] Calf Left: Right: Point of Measurement: 35 cm From Medial Instep 41 cm cm Ankle Left: Right: Point of Measurement: 14 cm From Medial Instep 23 cm cm Vascular Assessment Claudication: Claudication Assessment [Left:None] Pulses: Dorsalis Pedis Palpable: [Left:Yes] Posterior Tibial Extremity colors, hair growth, and conditions: Extremity Color: [Left:Normal] Hair Growth on Extremity: [Left:No] Temperature of Extremity: [Left:Warm] Capillary Refill: [Left:< 3 seconds] Toe Nail Assessment Left: Right: Thick: No Discolored: No Deformed: No Improper Length and Hygiene: No Electronic Signature(s) Signed: 10/01/2017 11:46:23 AM By: [name] Entered By: [name] on 10/01/2017 08:27:18 Soulliere, [name] -------------------------------------------------------------------------------- Multi Wound Chart Details Patient Name: [name], Douglas L. Date of Service: 10/01/2017 8:00 AM Medical Record Number: [id number] Patient Account Number:  [id number] Date of Birth/Sex: [birth year] (66 y.o. M) Treating RN: [name], Douglas Primary Care Iyesha Such: [name], Douglas Other Clinician: Referring Tamaiya Bump: [name], Douglas Treating Reyce Lubeck/Extender: STONE Wright, [name]: 3 Vital Signs Height(in): 72 Pulse(bpm): 63 Weight(lbs): 323.9 Blood Pressure(mmHg): 143/62 Body Mass Index(BMI): 44 Temperature(F): 97.7 Respiratory Rate 18 (breaths/min): Photos: [1:No Photos] [N/A:N/A] Wound Location: [1:Left Lower Leg - Lateral] [N/A:N/A] Wounding Event: [1:Gradually Appeared] [N/A:N/A] Primary Etiology: [1:Diabetic Wound/Ulcer of the Lower Extremity] [N/A:N/A] Comorbid History: [1:Cataracts, Anemia, Lymphedema, Hypertension, Type II Diabetes] [N/A:N/A] Date Acquired: [1:08/26/2017] [N/A:N/A] Weeks of Wright: [1:3] [N/A:N/A] Wound Status: [1:Open] [N/A:N/A] Measurements L x W x D [1:1.5x1.5x0.2] [  N/A:N/A] (cm) Area (cm) : [1:1.767] [N/A:N/A] Volume (cm) : [1:0.353] [N/A:N/A] % Reduction in Area: [1:43.60%] [N/A:N/A] % Reduction in Volume: [1:-12.80%] [N/A:N/A] Classification: [1:Grade 1] [N/A:N/A] Exudate Amount: [1:Medium] [N/A:N/A] Exudate Type: [1:Serous] [N/A:N/A] Exudate Color: [1:amber] [N/A:N/A] Wound Margin: [1:Flat and Intact] [N/A:N/A] Granulation Amount: [1:Large (67-100%)] [N/A:N/A] Granulation Quality: [1:Red, Pink] [N/A:N/A] Necrotic Amount: [1:Small (1-33%)] [N/A:N/A] Exposed Structures: [1:Fat Layer (Subcutaneous Tissue) Exposed: Yes Fascia: No Tendon: No Muscle: No Joint: No Bone: No] [N/A:N/A] Epithelialization: [1:None] [N/A:N/A] Periwound Skin Texture: [1:Excoriation: No Induration: No Callus: No Crepitus: No] [N/A:N/A] Rash: No Scarring: No Periwound Skin Moisture: Maceration: No N/A N/A Dry/Scaly: No Periwound Skin Color: Atrophie Blanche: No N/A N/A Cyanosis: No Ecchymosis: No Erythema: No Hemosiderin Staining: No Mottled: No Pallor: No Rubor: No Temperature: No Abnormality N/A  N/A Tenderness on Palpation: Yes N/A N/A Wound Preparation: Ulcer Cleansing: N/A N/A Rinsed/Irrigated with Saline, Other: soap and water Topical Anesthetic Applied: Other: lidocaine 4% Wright Notes Electronic Signature(s) Signed: 10/01/2017 5:13:20 PM By: [name] Entered By: [name] on 10/01/2017 08:31:32 [name], [name] ([id number]) -------------------------------------------------------------------------------- Multi-Disciplinary Care Plan Details Patient Name: [name], VELAQUEZ. Date of Service: 10/01/2017 8:00 AM Medical Record Number: [id number] Patient Account Number: [id number] Date of Birth/Sex: 06-06-1950 (66 y.o. M) Treating RN: Douglas Haggis Primary Care Larrisha Babineau: Douglas Shames Other Clinician: Referring Keirstan Iannello: Douglas Shames Treating Bevan Vu/Extender: STONE Wright, [name]: 3 Active Inactive ` Orientation to the Wound Care Program Nursing Diagnoses: Knowledge deficit related to the wound healing center program Goals: Patient/caregiver will verbalize understanding of the Wound Healing Center Program Date Initiated: 09/09/2017 Target Resolution Date: 09/30/2017 Goal Status: Active Interventions: Provide education on orientation to the wound center Notes: ` Wound/Skin Impairment Nursing Diagnoses: Impaired tissue integrity Goals: Patient/caregiver will verbalize understanding of skin care regimen Date Initiated: 09/09/2017 Target Resolution Date: 09/28/2017 Goal Status: Active Ulcer/skin breakdown will have a volume reduction of 30% by week 4 Date Initiated: 09/09/2017 Target Resolution Date: 09/28/2017 Goal Status: Active Interventions: Assess patient/caregiver ability to obtain necessary supplies Assess patient/caregiver ability to perform ulcer/skin care regimen upon admission and as needed Assess ulceration(s) every visit Wright Activities: Skin care regimen initiated : 09/09/2017 Notes: Electronic  Signature(s) Signed: 10/01/2017 5:13:20 PM By: Sharlotte Alamo, [name] Lango ([id number]) Entered By: [name] on 10/01/2017 08:31:20 Douglas Loll ([id number]) -------------------------------------------------------------------------------- Pain Assessment Details Patient Name: [name], Douglas L. Date of Service: 10/01/2017 8:00 AM Medical Record Number: [id number] Patient Account Number: [id number] Date of Birth/Sex: 10-Jan-1951 (66 y.o. M) Treating RN: Douglas Crigler Primary Care Wen Merced: Douglas Shames Other Clinician: Referring Ariellah Faust: Douglas Shames Treating Rochelle Nephew/Extender: STONE Wright, [name]: 3 Active Problems Location of Pain Severity and Description of Pain Patient Has Paino No Site Locations Pain Management and Medication Current Pain Management: Electronic Signature(s) Signed: 10/01/2017 11:46:23 AM By: Douglas Crigler Entered By: Douglas Crigler on 10/01/2017 08:13:57 Douglas Loll ([id number]) -------------------------------------------------------------------------------- Patient/Caregiver Education Details Patient Name: [name], Douglas L. Date of Service: 10/01/2017 8:00 AM Medical Record Number: [id number] Patient Account Number: [id number] Date of Birth/Gender: 09-26-1950 (66 y.o. M) Treating RN: Douglas Haggis Primary Care Physician: Douglas Shames Other Clinician: Referring Physician: Leotis Shames Treating Physician/Extender: Douglas Dibbles, [name]: 3 Education Assessment Education Provided To: Patient Education Topics Provided Wound/Skin Impairment: Handouts: Caring for Your Ulcer, Skin Care Do's and Dont's, Other: change dressing as ordered Methods: Demonstration, Explain/Verbal Responses: State content correctly Electronic Signature(s) Signed: 10/01/2017 5:13:20 PM By: [name] Entered By: [name] on 10/01/2017 08:46:35 Pollio, Jaye  L.  ([id number]) -------------------------------------------------------------------------------- Wound Assessment Details Patient Name: [name], DEBRUIN. Date of Service: 10/01/2017 8:00 AM Medical Record Number: [id number] Patient Account Number: [id number] Date of Birth/Sex: 1950/05/14 (66 y.o. M) Treating RN: Douglas Crigler Primary Care Shonita Rinck: Douglas Shames Other Clinician: Referring Kea Callan: Douglas Shames Treating Jaeleah Smyser/Extender: STONE Wright, [name]: 3 Wound Status Wound Number: 1 Primary Diabetic Wound/Ulcer of the Lower Extremity Etiology: Wound Location: Left Lower Leg - Lateral Wound Status: Open Wounding Event: Gradually Appeared Comorbid Cataracts, Anemia, Lymphedema, Date Acquired: 08/26/2017 History: Hypertension, Type II Diabetes Weeks Of Wright: 3 Clustered Wound: No Wound Measurements Length: (cm) 1.5 Width: (cm) 1.5 Depth: (cm) 0.2 Area: (cm) 1.767 Volume: (cm) 0.353 % Reduction in Area: 43.6% % Reduction in Volume: -12.8% Epithelialization: None Tunneling: No Undermining: No Wound Description Classification: Grade 1 Wound Margin: Flat and Intact Exudate Amount: Medium Exudate Type: Serous Exudate Color: amber Foul Odor After Cleansing: No Slough/Fibrino Yes Wound Bed Granulation Amount: Large (67-100%) Exposed Structure Granulation Quality: Red, Pink Fascia Exposed: No Necrotic Amount: Small (1-33%) Fat Layer (Subcutaneous Tissue) Exposed: Yes Necrotic Quality: Adherent Slough Tendon Exposed: No Muscle Exposed: No Joint Exposed: No Bone Exposed: No Periwound Skin Texture Texture Color No Abnormalities Noted: No No Abnormalities Noted: No Callus: No Atrophie Blanche: No Crepitus: No Cyanosis: No Excoriation: No Ecchymosis: No Induration: No Erythema: No Rash: No Hemosiderin Staining: No Scarring: No Mottled: No Pallor: No Moisture Rubor: No No Abnormalities Noted: No Dry / Scaly: No Temperature /  Pain Maceration: No Temperature: No Abnormality Tenderness on Palpation: Yes [name], Douglas L. ([id number]) Wound Preparation Ulcer Cleansing: Rinsed/Irrigated with Saline, Other: soap and water, Topical Anesthetic Applied: Other: lidocaine 4%, Wright Notes Wound #1 (Left, Lateral Lower Leg) 1. Cleansed with: Clean wound with Normal Saline Cleanse wound with antibacterial soap and water 2. Anesthetic Topical Lidocaine 4% cream to wound bed prior to debridement 3. Peri-wound Care: Moisturizing lotion 4. Dressing Applied: Prisma Ag 5. Secondary Dressing Applied ABD Pad 7. Secured with Tape 3 Layer Compression System - Left Lower Extremity Notes unna to anchor; Electronic Signature(s) Signed: 10/01/2017 11:46:23 AM By: Douglas Crigler Entered By: Douglas Crigler on 10/01/2017 08:25:34 Douglas Loll ([id number]) -------------------------------------------------------------------------------- Vitals Details Patient Name: RYDER, MAN L. Date of Service: 10/01/2017 8:00 AM Medical Record Number: [id number] Patient Account Number: [id number] Date of Birth/Sex: 1950-12-07 (66 y.o. M) Treating RN: Douglas Crigler Primary Care Aeva Posey: Douglas Shames Other Clinician: Referring Morty Ortwein: Douglas Shames Treating Ovie Eastep/Extender: STONE Wright, [name]: 3 Vital Signs Time Taken: 08:14 Temperature (F): 97.7 Height (in): 72 Pulse (bpm): 63 Weight (lbs): 323.9 Respiratory Rate (breaths/min): 18 Body Mass Index (BMI): 43.9 Blood Pressure (mmHg): 143/62 Reference Range: 80 - 120 mg / dl Electronic Signature(s) Signed: 10/01/2017 11:46:23 AM By: Douglas Crigler Entered By: Douglas Crigler on 10/01/2017 08:14:16

## 2017-10-08 ENCOUNTER — Encounter: Payer: BLUE CROSS/BLUE SHIELD | Admitting: Physician Assistant

## 2017-10-08 DIAGNOSIS — E11622 Type 2 diabetes mellitus with other skin ulcer: Secondary | ICD-10-CM | POA: Diagnosis not present

## 2017-10-10 NOTE — Progress Notes (Signed)
LJ, MIYAMOTO ([id number]) Visit Report for 10/08/2017 Chief Complaint Document Details Patient Name: [name], [name]. Date of Service: 10/08/2017 2:00 PM Medical Record Number: [id number] Patient Account Number: [id number] Date of Birth/Sex: [birth year] (67 y.o. M) Treating RN: Phillis Haggis Primary Care Provider: Leotis Shames Other Clinician: Referring Provider: Leotis Shames Treating Provider/Extender: Linwood Dibbles, Lithzy Bernard Weeks in Treatment: 4 Information Obtained from: Patient Chief Complaint Left leg ulcer Electronic Signature(s) Signed: 10/09/2017 1:39:21 PM By: Lenda Kelp PA-C Entered By: Lenda Kelp on 10/08/2017 14:15:58 Bubb, [name] ([id number]) -------------------------------------------------------------------------------- Debridement Details Patient Name: [name], Douglas L. Date of Service: 10/08/2017 2:00 PM Medical Record Number: [id number] Patient Account Number: [id number] Date of Birth/Sex: May 24, 1950 (67 y.o. M) Treating RN: Phillis Haggis Primary Care Provider: Leotis Shames Other Clinician: Referring Provider: Leotis Shames Treating Provider/Extender: STONE III, Odes Lolli Weeks in Treatment: 4 Debridement Performed for Wound #1 Left,Lateral Lower Leg Assessment: Performed By: Physician STONE III, Torra Pala E., PA-C Debridement Type: Debridement Severity of Tissue Pre Fat layer exposed Debridement: Pre-procedure Verification/Time Yes - 14:54 Out Taken: Start Time: 14:54 Pain Control: Lidocaine 4% Topical Solution Total Area Debrided (L x W): 1.7 (cm) x 1.4 (cm) = 2.38 (cm) Tissue and other material Viable, Non-Viable, Slough, Subcutaneous, Fibrin/Exudate, Slough debrided: Level: Skin/Subcutaneous Tissue Debridement Description: Excisional Instrument: Curette Bleeding: Minimum Hemostasis Achieved: Pressure Procedural Pain: 0 Post Procedural Pain: 0 Response to Treatment: Procedure was tolerated well Level of Consciousness: Awake and  Alert Post Debridement Measurements of Total Wound Length: (cm) 1.7 Width: (cm) 1.4 Depth: (cm) 0.3 Volume: (cm) 0.561 Character of Wound/Ulcer Post Debridement: Requires Further Debridement Severity of Tissue Post Debridement: Fat layer exposed Post Procedure Diagnosis Same as Pre-procedure Electronic Signature(s) Signed: 10/09/2017 1:39:21 PM By: Lenda Kelp PA-C Signed: 10/09/2017 5:07:56 PM By: Alejandro Mulling Entered By: Alejandro Mulling on 10/08/2017 14:55:15 Arredondo, [name] ([id number]) -------------------------------------------------------------------------------- HPI Details Patient Name: [name], Douglas L. Date of Service: 10/08/2017 2:00 PM Medical Record Number: [id number] Patient Account Number: [id number] Date of Birth/Sex: 01/18/1951 (67 y.o. M) Treating RN: Phillis Haggis Primary Care Provider: Leotis Shames Other Clinician: Referring Provider: Leotis Shames Treating Provider/Extender: STONE III, Nehal Witting Weeks in Treatment: 4 History of Present Illness HPI Description: MYASTHENIA GRAVIS (MG) MEDICATION ALERT: Medications that increase weakness in most MG patients should be avoided or used only with great caution. These include: - Fluoroquinolone antibiotics: moxifloxacin (Aveloxo), ciprofloxacin (Ciproo, Proquin XRo), norfloxacin (Noroxino), levofloxacin (Levaquino), ofloxacin (Floxino), gemifloxacin (Factiveo) - Aminoglycoside antibiotics: tobramycin, gentamycin, kanamycin, neomycin, streptomycin - Macrolide antibiotics: erythromycin, azithromycin (Z-Pako, Zithromaxo), telithromycin (Keteko) - Botulinum toxins: Botoxo, Myobloco, Dysporto, Xeomino - Beta-blockers such as propanolol or timolol eyedrops (Timoptico) - Calcium channel blockers (blood pressure medications) - Quinine, quinidine or procainamide - D-penicillamine (do not confuse with penicillin) - Interferon - Magnesium salts: milk of magnesia, some antacids (Maaloxo, Mylantao) - Curare and  related drugs (usually used only during surgery) Other medications may produce problems in some MG patients. Be sure your doctor/dentist knows you have MG when any new medication is prescribed. Contact your myasthenia doctor if you or your local doctor has questions about medications. 09/09/17 on evaluation today patient actually appears for initial evaluation today concerning his left lateral lower extremity ulcer which has been present he tells me since November 2018. He states that gas logs that he'd forgotten he had said somewhere actually fell over landing on his leg causing the injury and unfortunately the left leg has never fully recovered since that time. He does note that there was some  improvement initially and now it's pretty much stagnant. He does have venous stasis chronically although he states it's been worse and pass compared to current. He has also been diagnosed with lymphedema although again this is something else that has been deemed the worst in the past compared to present. He does have lymphedema pumps although he really has not been using those on a regular basis. Patient does have diabetes mellitus type II in other than the venous insufficiency and prayerful vascular disease he also has myasthenia gravis board she is on medications long-term. Currently he has been tolerating the medications for this very well he does have a list as noted above in the HPI of medications that he is to avoid that we need to keep in mind. Nonetheless the patient seems to be doing fairly well in general. I do believe his peripheral edema may be contributing to the nonhealing factor in regard to this ulcer. 09/16/17 on evaluation today patient appears to be doing rather well his wound actually appears to show signs of improvement compared to previous evaluation which is great news. Overall his wound is a little larger but again I did debride the wound sharply last week so this is definitely expected  as far as I was concerned. Nonetheless the wound bed itself seems to be doing excellent. He is having no discomfort normally he does have a little bit of pain with cleansing of the wound by myself today. 09/23/17 on evaluation today patient actually appears to be showing signs of good improvement in regard to his left lateral motion the ulcer. He's been tolerating the dressing changes without complication. Fortunately there does not appear to be any evidence of infection at this point which is great news. Overall I'm extremely happy that he seems to be doing so well and the patient likewise wants to be healed as soon as possible he actually has a pull at home he would love to be in but he hasn't been able to get into this due to what's going on currently. 10/01/16 on evaluation today patient appears to be doing rather well in regard to his left lateral lower Trinity ulcer. He's been tolerating the dressing changes without complication. There does not appear to be any evidence of infection at this time. Overall I'm very pleased with the progress that has been made up to this point. I do believe after today will likely be able to switch to a different dressing. [name], [name] ([id number]) 10/08/17 on evaluation today patient actually appears to be doing very well at this point in time. His wound is showing signs of progress which is good news I am not even having to debride as much away as far as the slough is concerned. He does have some Slough which requires debridement at this point however. No fevers, chills, nausea, or vomiting noted at this time. Electronic Signature(s) Signed: 10/09/2017 1:39:21 PM By: Lenda Kelp PA-C Entered By: Lenda Kelp on 10/09/2017 07:59:57 [name], [name] ([id number]) -------------------------------------------------------------------------------- Physical Exam Details Patient Name: KEEDAN, SAMPLE L. Date of Service: 10/08/2017 2:00 PM Medical Record  Number: [id number] Patient Account Number: [id number] Date of Birth/Sex: [birth year] (67 y.o. M) Treating RN: Phillis Haggis Primary Care Provider: Leotis Shames Other Clinician: Referring Provider: Leotis Shames Treating Provider/Extender: STONE III, Marquay Kruse Weeks in Treatment: 4 Constitutional Well-nourished and well-hydrated in no acute distress. Respiratory normal breathing without difficulty. Psychiatric this patient is able to make decisions and demonstrates good insight into disease process. Alert and  Oriented x 3. pleasant and cooperative. Notes At this point patient's wound bed shows evidence of excellent improvement which is great news. He has been tolerating the dressing changes without complication and the wraps as well. In general I'm very happy with the overall progress. Post debridement the wound bed appears to be much better. Electronic Signature(s) Signed: 10/09/2017 1:39:21 PM By: Lenda KelpStone III, Tashae Inda PA-C Entered By: Lenda KelpStone III, Nasier Thumm on 10/09/2017 08:00:28 [name], Douglas L. ([id number]) -------------------------------------------------------------------------------- Physician Orders Details Patient Name: [name], Douglas L. Date of Service: 10/08/2017 2:00 PM Medical Record Number: [id number] Patient Account Number: [id number] Date of Birth/Sex: [birth year] (67 y.o. M) Treating RN: Phillis HaggisPinkerton, Debi Primary Care Provider: Leotis ShamesSINGH, JASMINE Other Clinician: Referring Provider: Leotis ShamesSINGH, JASMINE Treating Provider/Extender: STONE III, Jamika Sadek Weeks in Treatment: 4 Verbal / Phone Orders: Yes Clinician: Pinkerton, Debi Read Back and Verified: Yes Diagnosis Coding ICD-10 Coding Code Description E11.622 Type 2 diabetes mellitus with other skin ulcer 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 of other part of left lower leg with fat layer exposed I89.0 Lymphedema, not elsewhere classified G70.00 Myasthenia gravis without (acute) exacerbation I87.2 Venous insufficiency (chronic)  (peripheral) I73.9 Peripheral vascular disease, unspecified Wound Cleansing Wound #1 Left,Lateral Lower Leg o Clean wound with Normal Saline. Anesthetic (add to Medication List) Wound #1 Left,Lateral Lower Leg o Topical Lidocaine 4% cream applied to wound bed prior to debridement (In Clinic Only). Skin Barriers/Peri-Wound Care Wound #1 Left,Lateral Lower Leg o Moisturizing lotion Primary Wound Dressing Wound #1 Left,Lateral Lower Leg o Silver Collagen Secondary Dressing Wound #1 Left,Lateral Lower Leg o ABD pad Dressing Change Frequency Wound #1 Left,Lateral Lower Leg o Change dressing every week Follow-up Appointments Wound #1 Left,Lateral Lower Leg o Return Appointment in 1 week. o Nurse Visit as needed Edema Control Wound #1 Left,Lateral Lower Leg [name], Douglas L. ([id number]) o 3 Layer Compression System - Left Lower Extremity - unna to anchor o Compression Pump: Use compression pump on left lower extremity for 30 minutes, twice daily. - work up to 1 hour twice daily o Compression Pump: Use compression pump on right lower extremity for 30 minutes, twice daily. - work up to 1 hour twice daily Additional Orders / Instructions Wound #1 Left,Lateral Lower Leg o Vitamin A; Vitamin C, Zinc Patient Medications Allergies: penicillin, Omnicef Notifications Medication Indication Start End lidocaine DOSE 1 - topical 4 % cream - 1 cream topical Electronic Signature(s) Signed: 10/09/2017 1:39:21 PM By: Lenda KelpStone III, Alahia Whicker PA-C Signed: 10/09/2017 5:07:56 PM By: Alejandro MullingPinkerton, Debra Entered By: Alejandro MullingPinkerton, Debra on 10/08/2017 14:54:08 [name], Taedyn L. ([id number]) -------------------------------------------------------------------------------- Prescription 10/08/2017 Patient Name: [name], Douglas L. Provider: Lenda KelpSTONE III, Raef Sprigg PA-C Date of Birth: [birth year] NPI#: [id number] Sex: Judie PetitM DEA#: [id number] Phone #: [phone] License #: Patient Address: [facility] [address] [facility] [place], [place] [address] [address], [address] [place], [place] [address] [phone] Allergies penicillin Omnicef Medication Medication: Route: Strength: Form: lidocaine topical 4% cream Class: TOPICAL LOCAL ANESTHETICS Dose: Frequency / Time: Indication: 1 1 cream topical Number of Refills: Number of Units: 0 Generic Substitution: Start Date: End Date: Administered at Substitution Permitted Facility: Yes Time Administered: Time Discontinued: Note to Pharmacy: Signature(s): Date(s): Electronic Signature(s) Signed: 10/09/2017 1:39:21 PM By: Lenda KelpStone III, Judyth Demarais PA-C Signed: 10/09/2017 5:07:56 PM By: Sharlotte AlamoPinkerton, Debra [name], Douglas LangoICHARD L. ([id number]) Entered By: Alejandro MullingPinkerton, Debra on 10/08/2017 14:54:09 [name], Rector L. ([id number]) --------------------------------------------------------------------------------  Problem List Details Patient Name: [name], Callahan L. Date of Service: 10/08/2017 2:00 PM Medical Record Number: [id number] Patient Account Number:  [id number] Date of Birth/Sex: 04/24/1950 (67 y.o. M) Treating RN: Phillis Haggis Primary Care Provider: Leotis Shames Other Clinician: Referring Provider: Leotis Shames Treating Provider/Extender: Linwood Dibbles, Jaliya Siegmann Weeks in Treatment: 4 Active Problems ICD-10 Evaluated Encounter Code Description Active Date Today Diagnosis E11.622 Type 2 diabetes mellitus with other skin ulcer 09/09/2017 No Yes 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 of other part of left lower leg with 09/09/2017 No Yes fat layer exposed I89.0 Lymphedema, not elsewhere classified 09/09/2017 No Yes G70.00 Myasthenia gravis without (acute) exacerbation 09/09/2017 No Yes I87.2 Venous insufficiency (chronic) (peripheral) 09/09/2017 No Yes I73.9 Peripheral vascular disease, unspecified 09/09/2017 No Yes Inactive Problems Resolved Problems Electronic Signature(s) Signed:  10/09/2017 1:39:21 PM By: Lenda Kelp PA-C Entered By: Lenda Kelp on 10/08/2017 14:15:43 Ent, [name] ([id number]) -------------------------------------------------------------------------------- Progress Note Details Patient Name: Douglas Loll. Date of Service: 10/08/2017 2:00 PM Medical Record Number: [id number] Patient Account Number: [id number] Date of Birth/Sex: 1950-05-25 (67 y.o. M) Treating RN: Phillis Haggis Primary Care Provider: Leotis Shames Other Clinician: Referring Provider: Leotis Shames Treating Provider/Extender: STONE III, Kaleiah Kutzer Weeks in Treatment: 4 Subjective Chief Complaint Information obtained from Patient Left leg ulcer History of Present Illness (HPI) MYASTHENIA GRAVIS (MG) MEDICATION ALERT: Medications that increase weakness in most MG patients should be avoided or used only with great caution. These include: - Fluoroquinolone antibiotics: moxifloxacin (Avelox), ciprofloxacin (Cipro, Proquin XR), norfloxacin (Noroxin), levofloxacin (Levaquin), ofloxacin (Floxin), gemifloxacin (Factive) - Aminoglycoside antibiotics: tobramycin, gentamycin, kanamycin, neomycin, streptomycin - Macrolide antibiotics: erythromycin, azithromycin (Z-Pak, Zithromax), telithromycin (Ketek) - Botulinum toxins: Botox, Myobloc, Dysport, Xeomin - Beta-blockers such as propanolol or timolol eyedrops (Timoptic) - Calcium channel blockers (blood pressure medications) - Quinine, quinidine or procainamide - D-penicillamine (do not confuse with penicillin) - Interferon - Magnesium salts: milk of magnesia, some antacids (Maalox, Mylanta) - Curare and related drugs (usually used only during surgery) Other medications may produce problems in some MG patients. Be sure your doctor/dentist knows you have MG when any new medication is prescribed. Contact your myasthenia doctor if you or your local doctor has questions about medications. 09/09/17 on  evaluation today patient actually appears for initial evaluation today concerning his left lateral lower extremity ulcer which has been present he tells me since November 2018. He states that gas logs that he'd forgotten he had said somewhere actually fell over landing on his leg causing the injury and unfortunately the left leg has never fully recovered since that time. He does note that there was some improvement initially and now it's pretty much stagnant. He does have venous stasis chronically although he states it's been worse and pass compared to current. He has also been diagnosed with lymphedema although again this is something else that has been deemed the worst in the past compared to present. He does have lymphedema pumps although he really has not been using those on a regular basis. Patient does have diabetes mellitus type II in other than the venous insufficiency and prayerful vascular disease he also has myasthenia gravis board she is on medications long-term. Currently he has been tolerating the medications for this very well he does have a list as noted above in the HPI of medications that he is to avoid that we need to keep in mind. Nonetheless the patient seems to be doing fairly well in general. I do believe his peripheral edema may be contributing to the nonhealing factor in regard to this ulcer. 09/16/17 on evaluation today patient appears to be doing rather well his wound actually appears  to show signs of improvement compared to previous evaluation which is great news. Overall his wound is a little larger but again I did debride the wound sharply last week so this is definitely expected as far as I was concerned. Nonetheless the wound bed itself seems to be doing excellent. He is having no discomfort normally he does have a little bit of pain with cleansing of the wound by myself today. 09/23/17 on evaluation today patient actually appears to be showing signs of good improvement  in regard to his left lateral motion the ulcer. He's been tolerating the dressing changes without complication. Fortunately there does not appear to be any evidence of infection at this point which is great news. Overall I'm extremely happy that he seems to be doing so well and the patient likewise wants to be healed as soon as possible he actually has a pull at home he would love to be in but he hasn't HAMP, MORELAND L. ([id number]) been able to get into this due to what's going on currently. 10/01/16 on evaluation today patient appears to be doing rather well in regard to his left lateral lower Trinity ulcer. He's been tolerating the dressing changes without complication. There does not appear to be any evidence of infection at this time. Overall I'm very pleased with the progress that has been made up to this point. I do believe after today will likely be able to switch to a different dressing. 10/08/17 on evaluation today patient actually appears to be doing very well at this point in time. His wound is showing signs of progress which is good news I am not even having to debride as much away as far as the slough is concerned. He does have some Slough which requires debridement at this point however. No fevers, chills, nausea, or vomiting noted at this time. Patient History Information obtained from Patient. Family History Cancer - Father, Heart Disease - Father, No family history of Diabetes, Hereditary Spherocytosis, Hypertension, Kidney Disease, Lung Disease, Seizures, Stroke, Thyroid Problems, Tuberculosis. Social History Never smoker, Marital Status - Married, Alcohol Use - Never, Drug Use - No History, Caffeine Use - Daily. Review of Systems (ROS) Constitutional Symptoms (General Health) Denies complaints or symptoms of Fever, Chills. Respiratory The patient has no complaints or symptoms. Cardiovascular Complains or has symptoms of LE edema. Psychiatric The patient has no  complaints or symptoms. Objective Constitutional Well-nourished and well-hydrated in no acute distress. Vitals Time Taken: 2:16 PM, Height: 72 in, Weight: 323.9 lbs, BMI: 43.9, Temperature: 97.7 F, Pulse: 66 bpm, Respiratory Rate: 18 breaths/min, Blood Pressure: 132/52 mmHg. Respiratory normal breathing without difficulty. Psychiatric this patient is able to make decisions and demonstrates good insight into disease process. Alert and Oriented x 3. pleasant and cooperative. General Notes: At this point patient's wound bed shows evidence of excellent improvement which is great news. He has been Henderson, [place] L. ([id number]) tolerating the dressing changes without complication and the wraps as well. In general I'm very happy with the overall progress. Post debridement the wound bed appears to be much better. Integumentary (Hair, Skin) Wound #1 status is Open. Original cause of wound was Gradually Appeared. The wound is located on the Left,Lateral Lower Leg. The wound measures 1.7cm length x 1.4cm width x 0.2cm depth; 1.869cm^2 area and 0.374cm^3 volume. There is Fat Layer (Subcutaneous Tissue) Exposed exposed. There is no tunneling or undermining noted. There is a medium amount of serous drainage noted. The wound margin is flat and intact. There  is large (67-100%) red, pink granulation within the wound bed. There is a small (1-33%) amount of necrotic tissue within the wound bed including Adherent Slough. The periwound skin appearance did not exhibit: Callus, Crepitus, Excoriation, Induration, Rash, Scarring, Dry/Scaly, Maceration, Atrophie Blanche, Cyanosis, Ecchymosis, Hemosiderin Staining, Mottled, Pallor, Rubor, Erythema. Periwound temperature was noted as No Abnormality. The periwound has tenderness on palpation. Assessment Active Problems ICD-10 Type 2 diabetes mellitus with other skin ulcer Non-pressure chronic ulcer of other part of left lower leg with fat layer  exposed Lymphedema, not elsewhere classified Myasthenia gravis without (acute) exacerbation Venous insufficiency (chronic) (peripheral) Peripheral vascular disease, unspecified Procedures Wound #1 Pre-procedure diagnosis of Wound #1 is a Diabetic Wound/Ulcer of the Lower Extremity located on the Left,Lateral Lower Leg .Severity of Tissue Pre Debridement is: Fat layer exposed. There was a Excisional Skin/Subcutaneous Tissue Debridement with a total area of 2.38 sq cm performed by STONE III, Chemere Steffler E., PA-C. With the following instrument(s): Curette to remove Viable and Non-Viable tissue/material. Material removed includes Subcutaneous Tissue, Slough, and Fibrin/Exudate after achieving pain control using Lidocaine 4% Topical Solution. No specimens were taken. A time out was conducted at 14:54, prior to the start of the procedure. A Minimum amount of bleeding was controlled with Pressure. The procedure was tolerated well with a pain level of 0 throughout and a pain level of 0 following the procedure. Patient s Level of Consciousness post procedure was recorded as Awake and Alert. Post Debridement Measurements: 1.7cm length x 1.4cm width x 0.3cm depth; 0.561cm^3 volume. Character of Wound/Ulcer Post Debridement requires further debridement. Severity of Tissue Post Debridement is: Fat layer exposed. Post procedure Diagnosis Wound #1: Same as Pre-Procedure Plan Wound Cleansing: Wound #1 Left,Lateral Lower Leg: Reading, Elliott L. ([id number]) Clean wound with Normal Saline. Anesthetic (add to Medication List): Wound #1 Left,Lateral Lower Leg: Topical Lidocaine 4% cream applied to wound bed prior to debridement (In Clinic Only). Skin Barriers/Peri-Wound Care: Wound #1 Left,Lateral Lower Leg: Moisturizing lotion Primary Wound Dressing: Wound #1 Left,Lateral Lower Leg: Silver Collagen Secondary Dressing: Wound #1 Left,Lateral Lower Leg: ABD pad Dressing Change Frequency: Wound #1 Left,Lateral  Lower Leg: Change dressing every week Follow-up Appointments: Wound #1 Left,Lateral Lower Leg: Return Appointment in 1 week. Nurse Visit as needed Edema Control: Wound #1 Left,Lateral Lower Leg: 3 Layer Compression System - Left Lower Extremity - unna to anchor Compression Pump: Use compression pump on left lower extremity for 30 minutes, twice daily. - work up to 1 hour twice daily Compression Pump: Use compression pump on right lower extremity for 30 minutes, twice daily. - work up to 1 hour twice daily Additional Orders / Instructions: Wound #1 Left,Lateral Lower Leg: Vitamin A; Vitamin C, Zinc The following medication(s) was prescribed: lidocaine topical 4 % cream 1 1 cream topical was prescribed at facility I'm gonna suggest that we continue with the above wound care orders for the next week. The patient is in agreement with plan. We will subsequently see were things stand at follow-up. Please see above for specific wound care orders. We will see patient for re-evaluation in 1 week(s) here in the clinic. If anything worsens or changes patient will contact our office for additional recommendations. Electronic Signature(s) Signed: 10/09/2017 1:39:21 PM By: Lenda Kelp PA-C Entered By: Lenda Kelp on 10/09/2017 08:00:54 Hezzie, Karim [name] ([id number]) -------------------------------------------------------------------------------- ROS/PFSH Details Patient Name: KINGSLEY, FARACE. Date of Service: 10/08/2017 2:00 PM Medical Record Number: [id number] Patient Account Number: [id number] Date of Birth/Sex: March 11, 1951 (67 y.o. M)  Treating RN: Phillis Haggis Primary Care Provider: Leotis Shames Other Clinician: Referring Provider: Leotis Shames Treating Provider/Extender: STONE III, Dereka Lueras Weeks in Treatment: 4 Information Obtained From Patient Wound History Do you currently have one or more open woundso Yes How many open wounds do you currently haveo 1 Approximately how long  have you had your woundso nov 2018 How have you been treating your wound(s) until nowo band-aide, neo sporinHas your wound(s) ever healed and then re-openedo No Have you had any lab work done in the past montho No Have you tested positive for an antibiotic resistant organism (MRSA, VRE)o No Have you tested positive for osteomyelitis (bone infection)o No Have you had any tests for circulation on your legso No Have you had other problems associated with your woundso Swelling Constitutional Symptoms (General Health) Complaints and Symptoms: Negative for: Fever; Chills Cardiovascular Complaints and Symptoms: Positive for: LE edema Medical History: Positive for: Hypertension Eyes Medical History: Positive for: Cataracts - surgery Hematologic/Lymphatic Medical History: Positive for: Anemia; Lymphedema Respiratory Complaints and Symptoms: No Complaints or Symptoms Endocrine Medical History: Positive for: Type II Diabetes Time with diabetes: 15 yrs Treated with: Insulin, Oral agents CRIST, KRUSZKA ([id number]) Blood sugar tested every day: Yes Tested : Psychiatric Complaints and Symptoms: No Complaints or Symptoms HBO Extended History Items Eyes: Cataracts Immunizations Pneumococcal Vaccine: Received Pneumococcal Vaccination: Yes Implantable Devices Family and Social History Cancer: Yes - Father; Diabetes: No; Heart Disease: Yes - Father; Hereditary Spherocytosis: No; Hypertension: No; Kidney Disease: No; Lung Disease: No; Seizures: No; Stroke: No; Thyroid Problems: No; Tuberculosis: No; Never smoker; Marital Status - Married; Alcohol Use: Never; Drug Use: No History; Caffeine Use: Daily; Financial Concerns: No; Food, Clothing or Shelter Needs: No; Support System Lacking: No; Transportation Concerns: No; Advanced Directives: No; Patient does not want information on Advanced Directives; Do not resuscitate: No; Living Will: Yes (Copy provided); Medical Power of Attorney:  No Physician Affirmation I have reviewed and agree with the above information. Electronic Signature(s) Signed: 10/09/2017 1:39:21 PM By: Lenda Kelp PA-C Signed: 10/09/2017 5:07:56 PM By: Alejandro Mulling Entered By: Lenda Kelp on 10/09/2017 08:00:15 Shaughn, Thomley [name] ([id number]) -------------------------------------------------------------------------------- SuperBill Details Patient Name: SOPHIE, QUILES. Date of Service: 10/08/2017 Medical Record Number: [id number] Patient Account Number: [id number] Date of Birth/Sex: [birth year] (67 y.o. M) Treating RN: Phillis Haggis Primary Care Provider: Leotis Shames Other Clinician: Referring Provider: Leotis Shames Treating Provider/Extender: STONE III, Kathrine Rieves Weeks in Treatment: 4 Diagnosis Coding ICD-10 Codes Code Description E11.622 Type 2 diabetes mellitus with other skin ulcer 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 of other part of left lower leg with fat layer exposed I89.0 Lymphedema, not elsewhere classified G70.00 Myasthenia gravis without (acute) exacerbation I87.2 Venous insufficiency (chronic) (peripheral) I73.9 Peripheral vascular disease, unspecified Facility Procedures CPT4 Code Description: 27253664 11042 - DEB SUBQ TISSUE 20 SQ CM/< ICD-10 Diagnosis Description 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 of other part of left lower leg with Modifier: fat layer expos Quantity: 1 ed Physician Procedures CPT4 Code Description: 4034742 11042 - WC PHYS SUBQ TISS 20 SQ CM ICD-10 Diagnosis Description 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 of other part of left lower leg with Modifier: fat layer expos Quantity: 1 ed Electronic Signature(s) Signed: 10/09/2017 1:39:21 PM By: Lenda Kelp PA-C Entered By: Lenda Kelp on 10/09/2017 08:01:10

## 2017-10-10 NOTE — Progress Notes (Signed)
Douglas, Wright ([id number]) Visit Report for 10/08/2017 Arrival Information Details Patient Name: [name], [name]. Date of Service: 10/08/2017 2:00 PM Medical Record Number: [id number] Patient Account Number: [id number] Date of Birth/Sex: [birth year] (66 y.o. M) Treating RN: Curtis Sites Primary Care Mahima Hottle: Leotis Shames Other Clinician: Referring Jenisis Harmsen: Leotis Shames Treating Kyreese Chio/Extender: STONE III, HOYT Weeks in Treatment: 4 Visit Information History Since Last Visit Added or deleted any medications: No Patient Arrived: Ambulatory Any new allergies or adverse reactions: No Arrival Time: 14:16 Had a fall or experienced change in No Accompanied By: self activities of daily living that may affect Transfer Assistance: None risk of falls: Patient Identification Verified: Yes Signs or symptoms of abuse/neglect since last visito No Secondary Verification Process Completed: Yes Hospitalized since last visit: No Patient Requires Transmission-Based No Implantable device outside of the clinic excluding No Precautions: cellular tissue based products placed in the center Patient Has Alerts: Yes since last visit: Patient Alerts: DM II Has Dressing in Place as Prescribed: Yes Has Compression in Place as Prescribed: Yes Pain Present Now: No Electronic Signature(s) Signed: 10/08/2017 5:46:04 PM By: Curtis Sites Entered By: Curtis Sites on 10/08/2017 14:16:20 [name] ([id number]) -------------------------------------------------------------------------------- Encounter Discharge Information Details Patient Name: Douglas, [name] Date of Service: 10/08/2017 2:00 PM Medical Record Number: [id number] Patient Account Number: [id number] Date of Birth/Sex: [birth year] (66 y.o. M) Treating RN: Curtis Sites Primary Care Chloe Bluett: Leotis Shames Other Clinician: Referring Abaigeal Moomaw: Leotis Shames Treating Ahmaud Duthie/Extender: Linwood Dibbles, HOYT Weeks in Treatment:  4 Encounter Discharge Information Items Discharge Condition: Stable Ambulatory Status: Ambulatory Discharge Destination: Home Transportation: Private Auto Accompanied By: self Schedule Follow-up Appointment: Yes Clinical Summary of Care: Electronic Signature(s) Signed: 10/08/2017 4:34:19 PM By: Curtis Sites Entered By: Curtis Sites on 10/08/2017 16:34:19 Coverdale, [name] ([id number]) -------------------------------------------------------------------------------- Lower Extremity Assessment Details Patient Name: Douglas, [name] Date of Service: 10/08/2017 2:00 PM Medical Record Number: [id number] Patient Account Number: [id number] Date of Birth/Sex: 1950/04/20 (66 y.o. M) Treating RN: Curtis Sites Primary Care Auden Tatar: Leotis Shames Other Clinician: Referring Caelin Rosen: Leotis Shames Treating Vallerie Hentz/Extender: STONE III, HOYT Weeks in Treatment: 4 Edema Assessment Assessed: [Left: No] [Right: No] [Left: Edema] [Right: :] Calf Left: Right: Point of Measurement: 35 cm From Medial Instep 41.1 cm cm Ankle Left: Right: Point of Measurement: 14 cm From Medial Instep 23.3 cm cm Vascular Assessment Pulses: Dorsalis Pedis Palpable: [Left:Yes] Posterior Tibial Extremity colors, hair growth, and conditions: Extremity Color: [Left:Hyperpigmented] Hair Growth on Extremity: [Left:No] Temperature of Extremity: [Left:Warm] Capillary Refill: [Left:< 3 seconds] Toe Nail Assessment Left: Right: Thick: Yes Discolored: Yes Deformed: No Improper Length and Hygiene: No Electronic Signature(s) Signed: 10/08/2017 5:46:04 PM By: Curtis Sites Entered By: Curtis Sites on 10/08/2017 14:21:31 Chelf, [name] ([id number]) -------------------------------------------------------------------------------- Multi Wound Chart Details Patient Name: Douglas Huh L. Date of Service: 10/08/2017 2:00 PM Medical Record Number: [id number] Patient Account Number: [id number] Date of  Birth/Sex: [birth year] (66 y.o. M) Treating RN: Phillis Haggis Primary Care Shauniece Kwan: Leotis Shames Other Clinician: Referring Deija Buhrman: Leotis Shames Treating Sheikh Leverich/Extender: STONE III, HOYT Weeks in Treatment: 4 Vital Signs Height(in): 72 Pulse(bpm): 66 Weight(lbs): 323.9 Blood Pressure(mmHg): 132/52 Body Mass Index(BMI): 44 Temperature(F): 97.7 Respiratory Rate 18 (breaths/min): Photos: [N/A:N/A] Wound Location: Left Lower Leg - Lateral N/A N/A Wounding Event: Gradually Appeared N/A N/A Primary Etiology: Diabetic Wound/Ulcer of the N/A N/A Lower Extremity Comorbid History: Cataracts, Anemia, N/A N/A Lymphedema, Hypertension, Type II Diabetes Date Acquired: 08/26/2017 N/A N/A Weeks of Treatment: 4 N/A N/A Wound Status: Open N/A N/A Measurements  L x W x D 1.7x1.4x0.2 N/A N/A (cm) Area (cm) : 1.869 N/A N/A Volume (cm) : 0.374 N/A N/A % Reduction in Area: 40.40% N/A N/A % Reduction in Volume: -19.50% N/A N/A Classification: Grade 1 N/A N/A Exudate Amount: Medium N/A N/A Exudate Type: Serous N/A N/A Exudate Color: amber N/A N/A Wound Margin: Flat and Intact N/A N/A Granulation Amount: Large (67-100%) N/A N/A Granulation Quality: Red, Pink N/A N/A Necrotic Amount: Small (1-33%) N/A N/A Exposed Structures: Fat Layer (Subcutaneous N/A N/A Tissue) Exposed: Yes Fascia: No Tendon: No Muscle: No Wright, Douglas L. ([id number]) Joint: No Bone: No Epithelialization: Small (1-33%) N/A N/A Periwound Skin Texture: Excoriation: No N/A N/A Induration: No Callus: No Crepitus: No Rash: No Scarring: No Periwound Skin Moisture: Maceration: No N/A N/A Dry/Scaly: No Periwound Skin Color: Atrophie Blanche: No N/A N/A Cyanosis: No Ecchymosis: No Erythema: No Hemosiderin Staining: No Mottled: No Pallor: No Rubor: No Temperature: No Abnormality N/A N/A Tenderness on Palpation: Yes N/A N/A Wound Preparation: Ulcer Cleansing: N/A N/A Rinsed/Irrigated with  Saline, Other: soap and water Topical Anesthetic Applied: Other: lidocaine 4% Treatment Notes Electronic Signature(s) Signed: 10/09/2017 5:07:56 PM By: Alejandro Mulling Entered By: Alejandro Mulling on 10/08/2017 14:52:45 Andrew, Blasius [name] ([id number]) -------------------------------------------------------------------------------- Multi-Disciplinary Care Plan Details Patient Name: RAYQUON, USELMAN. Date of Service: 10/08/2017 2:00 PM Medical Record Number: [id number] Patient Account Number: [id number] Date of Birth/Sex: 02-Jan-1951 (66 y.o. M) Treating RN: Phillis Haggis Primary Care Gaberiel Youngblood: Leotis Shames Other Clinician: Referring Kimela Malstrom: Leotis Shames Treating Anzleigh Slaven/Extender: STONE III, HOYT Weeks in Treatment: 4 Active Inactive ` Orientation to the Wound Care Program Nursing Diagnoses: Knowledge deficit related to the wound healing center program Goals: Patient/caregiver will verbalize understanding of the Wound Healing Center Program Date Initiated: 09/09/2017 Target Resolution Date: 09/30/2017 Goal Status: Active Interventions: Provide education on orientation to the wound center Notes: ` Wound/Skin Impairment Nursing Diagnoses: Impaired tissue integrity Goals: Patient/caregiver will verbalize understanding of skin care regimen Date Initiated: 09/09/2017 Target Resolution Date: 09/28/2017 Goal Status: Active Ulcer/skin breakdown will have a volume reduction of 30% by week 4 Date Initiated: 09/09/2017 Target Resolution Date: 09/28/2017 Goal Status: Active Interventions: Assess patient/caregiver ability to obtain necessary supplies Assess patient/caregiver ability to perform ulcer/skin care regimen upon admission and as needed Assess ulceration(s) every visit Treatment Activities: Skin care regimen initiated : 09/09/2017 Notes: Electronic Signature(s) Signed: 10/09/2017 5:07:56 PM By: Sharlotte Alamo, [name] ([id number]) Entered By: Alejandro Mulling  on 10/08/2017 14:52:35 [name] ([id number]) -------------------------------------------------------------------------------- Pain Assessment Details Patient Name: MITSUO, BUDNICK L. Date of Service: 10/08/2017 2:00 PM Medical Record Number: [id number] Patient Account Number: [id number] Date of Birth/Sex: 02-Feb-1951 (66 y.o. M) Treating RN: Curtis Sites Primary Care Catherine Oak: Leotis Shames Other Clinician: Referring Anaid Haney: Leotis Shames Treating Alexandre Faries/Extender: STONE III, HOYT Weeks in Treatment: 4 Active Problems Location of Pain Severity and Description of Pain Patient Has Paino No Site Locations Pain Management and Medication Current Pain Management: Electronic Signature(s) Signed: 10/08/2017 5:46:04 PM By: Curtis Sites Entered By: Curtis Sites on 10/08/2017 14:16:27 [name] ([id number]) -------------------------------------------------------------------------------- Patient/Caregiver Education Details Patient Name: SIDDARTH, HSIUNG L. Date of Service: 10/08/2017 2:00 PM Medical Record Number: [id number] Patient Account Number: [id number] Date of Birth/Gender: July 01, 1950 (66 y.o. M) Treating RN: Curtis Sites Primary Care Physician: Leotis Shames Other Clinician: Referring Physician: Leotis Shames Treating Physician/Extender: Skeet Simmer in Treatment: 4 Education Assessment Education Provided To: Patient Education Topics Provided Venous: Handouts: Other: wrap precautions Methods: Explain/Verbal Responses: State content correctly Electronic Signature(s) Signed: 10/08/2017  5:46:04 PM By: Curtis Sitesorthy, Joanna Entered By: Curtis Sitesorthy, Joanna on 10/08/2017 16:34:37 Pendelton, Ferdinand LangoICHARD L. ([id number]) -------------------------------------------------------------------------------- Wound Assessment Details Patient Name: Douglas HuhHUNTER, Lorence L. Date of Service: 10/08/2017 2:00 PM Medical Record Number: [id number] Patient Account Number: [id number] Date  of Birth/Sex: [birth year] (66 y.o. M) Treating RN: Curtis Sitesorthy, Joanna Primary Care Ambria Mayfield: Leotis ShamesSINGH, JASMINE Other Clinician: Referring Assyria Morreale: Leotis ShamesSINGH, JASMINE Treating Leatrice Parilla/Extender: STONE III, HOYT Weeks in Treatment: 4 Wound Status Wound Number: 1 Primary Diabetic Wound/Ulcer of the Lower Extremity Etiology: Wound Location: Left Lower Leg - Lateral Wound Status: Open Wounding Event: Gradually Appeared Comorbid Cataracts, Anemia, Lymphedema, Date Acquired: 08/26/2017 History: Hypertension, Type II Diabetes Weeks Of Treatment: 4 Clustered Wound: No Photos Photo Uploaded By: Curtis Sitesorthy, Joanna on 10/08/2017 14:31:32 Wound Measurements Length: (cm) 1.7 Width: (cm) 1.4 Depth: (cm) 0.2 Area: (cm) 1.869 Volume: (cm) 0.374 % Reduction in Area: 40.4% % Reduction in Volume: -19.5% Epithelialization: Small (1-33%) Tunneling: No Undermining: No Wound Description Classification: Grade 1 Wound Margin: Flat and Intact Exudate Amount: Medium Exudate Type: Serous Exudate Color: amber Foul Odor After Cleansing: No Slough/Fibrino Yes Wound Bed Granulation Amount: Large (67-100%) Exposed Structure Granulation Quality: Red, Pink Fascia Exposed: No Necrotic Amount: Small (1-33%) Fat Layer (Subcutaneous Tissue) Exposed: Yes Necrotic Quality: Adherent Slough Tendon Exposed: No Muscle Exposed: No Joint Exposed: No Bone Exposed: No Periwound Skin Texture Lipton, Cannan L. ([id number]) Texture Color No Abnormalities Noted: No No Abnormalities Noted: No Callus: No Atrophie Blanche: No Crepitus: No Cyanosis: No Excoriation: No Ecchymosis: No Induration: No Erythema: No Rash: No Hemosiderin Staining: No Scarring: No Mottled: No Pallor: No Moisture Rubor: No No Abnormalities Noted: No Dry / Scaly: No Temperature / Pain Maceration: No Temperature: No Abnormality Tenderness on Palpation: Yes Wound Preparation Ulcer Cleansing: Rinsed/Irrigated with Saline, Other: soap and  water, Topical Anesthetic Applied: Other: lidocaine 4%, Treatment Notes Wound #1 (Left, Lateral Lower Leg) 1. Cleansed with: Cleanse wound with antibacterial soap and water 2. Anesthetic Topical Lidocaine 4% cream to wound bed prior to debridement 4. Dressing Applied: Prisma Ag 5. Secondary Dressing Applied ABD Pad 7. Secured with 3 Layer Compression System - Left Lower Extremity Notes unna to anchor; Electronic Signature(s) Signed: 10/08/2017 5:46:04 PM By: Curtis Sitesorthy, Joanna Entered By: Curtis Sitesorthy, Joanna on 10/08/2017 14:24:54 Vassie LollHUNTER, Benji L. ([id number]) -------------------------------------------------------------------------------- Vitals Details Patient Name: Douglas HuhHUNTER, Orlanda L. Date of Service: 10/08/2017 2:00 PM Medical Record Number: [id number] Patient Account Number: [id number] Date of Birth/Sex: [birth year] (66 y.o. M) Treating RN: Curtis Sitesorthy, Joanna Primary Care Skya Mccullum: Leotis ShamesSINGH, JASMINE Other Clinician: Referring Einer Meals: Leotis ShamesSINGH, JASMINE Treating Nesha Counihan/Extender: STONE III, HOYT Weeks in Treatment: 4 Vital Signs Time Taken: 14:16 Temperature (F): 97.7 Height (in): 72 Pulse (bpm): 66 Weight (lbs): 323.9 Respiratory Rate (breaths/min): 18 Body Mass Index (BMI): 43.9 Blood Pressure (mmHg): 132/52 Reference Range: 80 - 120 mg / dl Electronic Signature(s) Signed: 10/08/2017 5:46:04 PM By: Curtis Sitesorthy, Joanna Entered By: Curtis Sitesorthy, Joanna on 10/08/2017 14:17:36

## 2017-10-15 ENCOUNTER — Encounter: Payer: BLUE CROSS/BLUE SHIELD | Admitting: Physician Assistant

## 2017-10-15 DIAGNOSIS — E11622 Type 2 diabetes mellitus with other skin ulcer: Secondary | ICD-10-CM | POA: Diagnosis not present

## 2017-10-22 ENCOUNTER — Encounter: Payer: BLUE CROSS/BLUE SHIELD | Admitting: Physician Assistant

## 2017-10-22 DIAGNOSIS — E11622 Type 2 diabetes mellitus with other skin ulcer: Secondary | ICD-10-CM | POA: Diagnosis not present

## 2017-10-29 ENCOUNTER — Encounter: Payer: BLUE CROSS/BLUE SHIELD | Attending: Nurse Practitioner | Admitting: Nurse Practitioner

## 2017-10-29 DIAGNOSIS — E1151 Type 2 diabetes mellitus with diabetic peripheral angiopathy without gangrene: Secondary | ICD-10-CM | POA: Insufficient documentation

## 2017-10-29 DIAGNOSIS — E669 Obesity, unspecified: Secondary | ICD-10-CM | POA: Insufficient documentation

## 2017-10-29 DIAGNOSIS — I890 Lymphedema, not elsewhere classified: Secondary | ICD-10-CM | POA: Insufficient documentation

## 2017-10-29 DIAGNOSIS — I872 Venous insufficiency (chronic) (peripheral): Secondary | ICD-10-CM | POA: Insufficient documentation

## 2017-10-29 DIAGNOSIS — G7000 Myasthenia gravis without (acute) exacerbation: Secondary | ICD-10-CM | POA: Diagnosis not present

## 2017-10-29 DIAGNOSIS — Z6841 Body Mass Index (BMI) 40.0 and over, adult: Secondary | ICD-10-CM | POA: Insufficient documentation

## 2017-10-29 DIAGNOSIS — L97822 Non-pressure chronic ulcer of other part of left lower leg with fat layer exposed: Secondary | ICD-10-CM | POA: Diagnosis not present

## 2017-10-29 DIAGNOSIS — D649 Anemia, unspecified: Secondary | ICD-10-CM | POA: Diagnosis not present

## 2017-10-29 DIAGNOSIS — Z880 Allergy status to penicillin: Secondary | ICD-10-CM | POA: Diagnosis not present

## 2017-10-29 DIAGNOSIS — E11622 Type 2 diabetes mellitus with other skin ulcer: Secondary | ICD-10-CM | POA: Insufficient documentation

## 2017-10-29 DIAGNOSIS — E1136 Type 2 diabetes mellitus with diabetic cataract: Secondary | ICD-10-CM | POA: Insufficient documentation

## 2017-10-29 DIAGNOSIS — I10 Essential (primary) hypertension: Secondary | ICD-10-CM | POA: Diagnosis not present

## 2017-11-03 NOTE — Progress Notes (Signed)
KAIMEN, PEINE ([id number]) Visit Report for 10/22/2017 Arrival Information Details Patient Name: [name], [name]. Date of Service: 10/22/2017 9:15 AM Medical Record Number: [id number] Patient Account Number: [id number] Date of Birth/Sex: 01-29-1951 (66 y.o. M) Treating RN: Rema Jasmine Primary Care Montay Vanvoorhis: Leotis Shames Other Clinician: Referring Naysha Sholl: Leotis Shames Treating Douglas Smolinsky/Extender: Linwood Dibbles, HOYT Weeks in Treatment: 6 Visit Information History Since Last Visit Added or deleted any medications: No Patient Arrived: Ambulatory Any new allergies or adverse reactions: No Arrival Time: 09:28 Had a fall or experienced change in No Accompanied By: self activities of daily living that may affect Transfer Assistance: None risk of falls: Patient Identification Verified: Yes Signs or symptoms of abuse/neglect since last visito No Secondary Verification Process Completed: Yes Hospitalized since last visit: No Patient Requires Transmission-Based No Implantable device outside of the clinic excluding No Precautions: cellular tissue based products placed in the center Patient Has Alerts: Yes since last visit: Patient Alerts: DM II Has Dressing in Place as Prescribed: Yes Pain Present Now: No Electronic Signature(s) Signed: 10/25/2017 4:42:53 PM By: Rema Jasmine Entered By: Rema Jasmine on 10/22/2017 09:29:19 [name] ([id number]) -------------------------------------------------------------------------------- Encounter Discharge Information Details Patient Name: [name], [name] L. Date of Service: 10/22/2017 9:15 AM Medical Record Number: [id number] Patient Account Number: [id number] Date of Birth/Sex: 03-Sep-1950 (66 y.o. M) Treating RN: Renne Crigler Primary Care Reyansh Kushnir: Leotis Shames Other Clinician: Referring Summar Mcglothlin: Leotis Shames Treating Evangelene Vora/Extender: Linwood Dibbles, HOYT Weeks in Treatment: 6 Encounter Discharge Information Items Discharge  Condition: Stable Ambulatory Status: Ambulatory Discharge Destination: Home Transportation: Private Auto Schedule Follow-up Appointment: Yes Clinical Summary of Care: Electronic Signature(s) Signed: 10/23/2017 5:09:20 PM By: Renne Crigler Entered By: Renne Crigler on 10/22/2017 10:16:11 [name] ([id number]) -------------------------------------------------------------------------------- Lower Extremity Assessment Details Patient Name: [name], [name] L. Date of Service: 10/22/2017 9:15 AM Medical Record Number: [id number] Patient Account Number: [id number] Date of Birth/Sex: 09-15-1950 (66 y.o. M) Treating RN: Rema Jasmine Primary Care Aniko Finnigan: Leotis Shames Other Clinician: Referring Jatorian Renault: Leotis Shames Treating Michal Strzelecki/Extender: STONE III, HOYT Weeks in Treatment: 6 Edema Assessment Assessed: [Left: No] [Right: No] Edema: [Left: Ye] [Right: s] Calf Left: Right: Point of Measurement: 35 cm From Medial Instep 41 cm cm Ankle Left: Right: Point of Measurement: 14 cm From Medial Instep 25 cm cm Vascular Assessment Claudication: Claudication Assessment [Left:None] Pulses: Dorsalis Pedis Palpable: [Left:Yes] Posterior Tibial Extremity colors, hair growth, and conditions: Extremity Color: [Left:Normal] Hair Growth on Extremity: [Left:No] Temperature of Extremity: [Left:Cool] Capillary Refill: [Left:< 3 seconds] Toe Nail Assessment Left: Right: Thick: No Discolored: No Deformed: No Improper Length and Hygiene: No Electronic Signature(s) Signed: 10/25/2017 4:42:53 PM By: Rema Jasmine Entered By: Rema Jasmine on 10/22/2017 09:43:44 Varnum, Jossiah Elbert Ewings ([id number]) -------------------------------------------------------------------------------- Multi Wound Chart Details Patient Name: [name]. Date of Service: 10/22/2017 9:15 AM Medical Record Number: [id number] Patient Account Number: [id number] Date of Birth/Sex: 10-21-1950 (66 y.o. M) Treating RN:  Huel Coventry Primary Care Famous Eisenhardt: Leotis Shames Other Clinician: Referring Junella Domke: Leotis Shames Treating Alexsys Eskin/Extender: STONE III, HOYT Weeks in Treatment: 6 Vital Signs Height(in): 72 Pulse(bpm): 55 Weight(lbs): 323.9 Blood Pressure(mmHg): 143/62 Body Mass Index(BMI): 44 Temperature(F): 98.1 Respiratory Rate 16 (breaths/min): Photos: [N/A:N/A] Wound Location: Left, Lateral Lower Leg N/A N/A Wounding Event: Gradually Appeared N/A N/A Primary Etiology: Diabetic Wound/Ulcer of the N/A N/A Lower Extremity Comorbid History: Cataracts, Anemia, N/A N/A Lymphedema, Hypertension, Type II Diabetes Date Acquired: 08/26/2017 N/A N/A Weeks of Treatment: 6 N/A N/A Wound Status: Open N/A N/A Measurements L x W x D 1.8x1.3x0.3  N/A N/A (cm) Area (cm) : 1.838 N/A N/A Volume (cm) : 0.551 N/A N/A % Reduction in Area: 41.40% N/A N/A % Reduction in Volume: -76.00% N/A N/A Classification: Grade 1 N/A N/A Exudate Amount: Small N/A N/A Exudate Type: Serosanguineous N/A N/A Exudate Color: red, brown N/A N/A Wound Margin: Flat and Intact N/A N/A Granulation Amount: Large (67-100%) N/A N/A Granulation Quality: Red, Pink N/A N/A Necrotic Amount: Small (1-33%) N/A N/A Exposed Structures: Fat Layer (Subcutaneous N/A N/A Tissue) Exposed: Yes Fascia: No Tendon: No Muscle: No Joint: No Bone: No [name], Douglas L. ([id number]) Epithelialization: Small (1-33%) N/A N/A Periwound Skin Texture: Excoriation: No N/A N/A Induration: No Callus: No Crepitus: No Rash: No Scarring: No Periwound Skin Moisture: Maceration: No N/A N/A Dry/Scaly: No Periwound Skin Color: Erythema: Yes N/A N/A Atrophie Blanche: No Cyanosis: No Ecchymosis: No Hemosiderin Staining: No Mottled: No Pallor: No Rubor: No Erythema Location: Circumferential N/A N/A Temperature: No Abnormality N/A N/A Tenderness on Palpation: Yes N/A N/A Wound Preparation: Ulcer Cleansing: N/A N/A Rinsed/Irrigated with  Saline, Other: soap and water Topical Anesthetic Applied: Other: lidocaine 4% Treatment Notes Electronic Signature(s) Signed: 10/25/2017 6:17:48 PM By: Elliot GurneyWoody, BSN, RN, CWS, Kim RN, BSN Entered By: Elliot GurneyWoody, BSN, RN, CWS, Kim on 10/22/2017 09:52:05 Douglas LollHUNTER, Douglas L. ([id number]) -------------------------------------------------------------------------------- Multi-Disciplinary Care Plan Details Patient Name: Douglas LollHUNTER, Douglas L. Date of Service: 10/22/2017 9:15 AM Medical Record Number: [id number] Patient Account Number: [id number] Date of Birth/Sex: [birth year] (66 y.o. M) Treating RN: Huel CoventryWoody, Kim Primary Care Tyden Kann: Leotis ShamesSINGH, JASMINE Other Clinician: Referring Atharva Mirsky: Leotis ShamesSINGH, JASMINE Treating Izadora Roehr/Extender: STONE III, HOYT Weeks in Treatment: 6 Active Inactive ` Orientation to the Wound Care Program Nursing Diagnoses: Knowledge deficit related to the wound healing center program Goals: Patient/caregiver will verbalize understanding of the Wound Healing Center Program Date Initiated: 09/09/2017 Target Resolution Date: 09/30/2017 Goal Status: Active Interventions: Provide education on orientation to the wound center Notes: ` Wound/Skin Impairment Nursing Diagnoses: Impaired tissue integrity Goals: Patient/caregiver will verbalize understanding of skin care regimen Date Initiated: 09/09/2017 Target Resolution Date: 09/28/2017 Goal Status: Active Ulcer/skin breakdown will have a volume reduction of 30% by week 4 Date Initiated: 09/09/2017 Target Resolution Date: 09/28/2017 Goal Status: Active Interventions: Assess patient/caregiver ability to obtain necessary supplies Assess patient/caregiver ability to perform ulcer/skin care regimen upon admission and as needed Assess ulceration(s) every visit Treatment Activities: Skin care regimen initiated : 09/09/2017 Notes: Electronic Signature(s) Signed: 10/25/2017 6:17:48 PM By: Elliot GurneyWoody, BSN, RN, CWS, Kim RN, BSN [address], Douglas L.  ([id number]) Entered By: Elliot GurneyWoody, BSN, RN, CWS, Kim on 10/22/2017 09:51:51 [name], Douglas LangoICHARD L. ([id number]) -------------------------------------------------------------------------------- Pain Assessment Details Patient Name: [name], Douglas L. Date of Service: 10/22/2017 9:15 AM Medical Record Number: [id number] Patient Account Number: [id number] Date of Birth/Sex: [birth year] (66 y.o. M) Treating RN: Rema JasmineNg, Wendi Primary Care Jericho Cieslik: Leotis ShamesSINGH, JASMINE Other Clinician: Referring Callum Wolf: Leotis ShamesSINGH, JASMINE Treating Dany Harten/Extender: STONE III, HOYT Weeks in Treatment: 6 Active Problems Location of Pain Severity and Description of Pain Patient Has Paino No Site Locations Pain Management and Medication Current Pain Management: Goals for Pain Management pt denies pain. encouraged to see primary for any pain management Electronic Signature(s) Signed: 10/25/2017 4:42:53 PM By: Rema JasmineNg, Wendi Entered By: Rema JasmineNg, Wendi on 10/22/2017 09:30:57 Douglas LollHUNTER, Ovid L. ([id number]) -------------------------------------------------------------------------------- Patient/Caregiver Education Details Patient Name: Douglas LollHUNTER, Branton L. Date of Service: 10/22/2017 9:15 AM Medical Record Number: [id number] Patient Account Number: [id number] Date of Birth/Gender: [birth year] (66 y.o. M) Treating RN: Renne CriglerFlinchum, Cheryl Primary Care Physician: Leotis ShamesSINGH, JASMINE Other Clinician: Referring Physician: Leotis ShamesSINGH, JASMINE Treating Physician/Extender:  STONE III, HOYT Weeks in Treatment: 6 Education Assessment Education Provided To: Patient Education Topics Provided Wound Debridement: Handouts: Wound Debridement Methods: Explain/Verbal Responses: State content correctly Wound/Skin Impairment: Handouts: Caring for Your Ulcer Methods: Explain/Verbal Responses: State content correctly Electronic Signature(s) Signed: 10/23/2017 5:09:20 PM By: Renne Crigler Entered By: Renne Crigler on 10/22/2017 10:16:35 [name]  ([id number]) -------------------------------------------------------------------------------- Wound Assessment Details Patient Name: [name], [name] L. Date of Service: 10/22/2017 9:15 AM Medical Record Number: [id number] Patient Account Number: [id number] Date of Birth/Sex: [birth year] (66 y.o. M) Treating RN: Huel Coventry Primary Care Pernell Lenoir: Leotis Shames Other Clinician: Referring Dontrez Pettis: Leotis Shames Treating Kaiyah Eber/Extender: STONE III, HOYT Weeks in Treatment: 6 Wound Status Wound Number: 1 Primary Diabetic Wound/Ulcer of the Lower Extremity Etiology: Wound Location: Left, Lateral Lower Leg Wound Status: Open Wounding Event: Gradually Appeared Comorbid Cataracts, Anemia, Lymphedema, Date Acquired: 08/26/2017 History: Hypertension, Type II Diabetes Weeks Of Treatment: 6 Clustered Wound: No Photos Photo Uploaded By: Rema Jasmine on 10/22/2017 09:48:28 Wound Measurements Length: (cm) 1.8 Width: (cm) 1.3 Depth: (cm) 0.3 Area: (cm) 1.838 Volume: (cm) 0.551 % Reduction in Area: 41.4% % Reduction in Volume: -76% Epithelialization: Small (1-33%) Tunneling: No Undermining: No Wound Description Classification: Grade 1 Foul Odor Wound Margin: Flat and Intact Slough/Fi Exudate Amount: Small Exudate Type: Serosanguineous Exudate Color: red, brown After Cleansing: No brino Yes Wound Bed Granulation Amount: Large (67-100%) Exposed Structure Granulation Quality: Red, Pink Fascia Exposed: No Necrotic Amount: Small (1-33%) Fat Layer (Subcutaneous Tissue) Exposed: Yes Necrotic Quality: Adherent Slough Tendon Exposed: No Muscle Exposed: No Joint Exposed: No Bone Exposed: No Periwound Skin Texture Texture Color No Abnormalities Noted: No No Abnormalities Noted: No Callus: No Atrophie Blanche: No [name], PFALZGRAF. ([id number]) Crepitus: No Cyanosis: No Excoriation: No Ecchymosis: No Induration: No Erythema: Yes Rash: No Erythema Location:  Circumferential Scarring: No Hemosiderin Staining: No Mottled: No Moisture Pallor: No No Abnormalities Noted: No Rubor: No Dry / Scaly: No Maceration: No Temperature / Pain Temperature: No Abnormality Tenderness on Palpation: Yes Wound Preparation Ulcer Cleansing: Rinsed/Irrigated with Saline, Other: soap and water, Topical Anesthetic Applied: Other: lidocaine 4%, Electronic Signature(s) Signed: 10/25/2017 6:17:48 PM By: Elliot Gurney, BSN, RN, CWS, Kim RN, BSN Entered By: Elliot Gurney, BSN, RN, CWS, Kim on 10/22/2017 09:51:36 Brunelle, [name] ([id number]) -------------------------------------------------------------------------------- Vitals Details Patient Name: DAYN, BARICH. Date of Service: 10/22/2017 9:15 AM Medical Record Number: [id number] Patient Account Number: [id number] Date of Birth/Sex: Oct 26, 1950 (66 y.o. M) Treating RN: Rema Jasmine Primary Care Cage Gupton: Leotis Shames Other Clinician: Referring Kathie Posa: Leotis Shames Treating Talton Delpriore/Extender: STONE III, HOYT Weeks in Treatment: 6 Vital Signs Time Taken: 09:30 Temperature (F): 98.1 Height (in): 72 Pulse (bpm): 55 Weight (lbs): 323.9 Respiratory Rate (breaths/min): 16 Body Mass Index (BMI): 43.9 Blood Pressure (mmHg): 143/62 Reference Range: 80 - 120 mg / dl Electronic Signature(s) Signed: 10/25/2017 4:42:53 PM By: Rema Jasmine Entered By: Rema Jasmine on 10/22/2017 09:34:00

## 2017-11-04 DIAGNOSIS — E11622 Type 2 diabetes mellitus with other skin ulcer: Secondary | ICD-10-CM | POA: Diagnosis not present

## 2017-11-06 NOTE — Progress Notes (Signed)
[name], [name] ([id number]) Visit Report for 10/22/2017 Chief Complaint Document Details Patient Name: [name], [name]. Date of Service: 10/22/2017 9:15 AM Medical Record Number: [id number] Patient Account Number: [id number] Date of Birth/Sex: [birth year] (66 y.o. M) Treating RN: Phillis Haggis Primary Care Provider: Leotis Shames Other Clinician: Referring Provider: Leotis Shames Treating Provider/Extender: Linwood Dibbles, HOYT Weeks in Treatment: 6 Information Obtained from: Patient Chief Complaint Left leg ulcer Electronic Signature(s) Signed: 10/22/2017 9:24:47 PM By: Lenda Kelp PA-C Entered By: Lenda Kelp on 10/22/2017 09:48:40 Weatherholtz, [name] ([id number]) -------------------------------------------------------------------------------- HPI Details Patient Name: [name], [name]. Date of Service: 10/22/2017 9:15 AM Medical Record Number: [id number] Patient Account Number: [id number] Date of Birth/Sex: [birth year] (66 y.o. M) Treating RN: Phillis Haggis Primary Care Provider: Leotis Shames Other Clinician: Referring Provider: Leotis Shames Treating Provider/Extender: STONE III, HOYT Weeks in Treatment: 6 History of Present Illness HPI Description: MYASTHENIA GRAVIS (MG) MEDICATION ALERT: Medications that increase weakness in most MG patients should be avoided or used only with great caution. These include: - Fluoroquinolone antibiotics: moxifloxacin (Aveloxo), ciprofloxacin (Ciproo, Proquin XRo), norfloxacin (Noroxino), levofloxacin (Levaquino), ofloxacin (Floxino), gemifloxacin (Factiveo) - Aminoglycoside antibiotics: tobramycin, gentamycin, kanamycin, neomycin, streptomycin - Macrolide antibiotics: erythromycin, azithromycin (Z-Pako, Zithromaxo), telithromycin (Keteko) - Botulinum toxins: Botoxo, Myobloco, Dysporto, Xeomino - Beta-blockers such as propanolol or timolol eyedrops (Timoptico) - Calcium channel blockers (blood pressure medications) - Quinine,  quinidine or procainamide - [name]-penicillamine (do not confuse with penicillin) - Interferon - Magnesium salts: milk of magnesia, some antacids (Maaloxo, Mylantao) - Curare and related drugs (usually used only during surgery) Other medications may produce problems in some MG patients. Be sure your doctor/dentist knows you have MG when any new medication is prescribed. Contact your myasthenia doctor if you or your local doctor has questions about medications. 09/09/17 on evaluation today patient actually appears for initial evaluation today concerning his left lateral lower extremity ulcer which has been present he tells me since November 2018. He states that gas logs that he'[name] forgotten he had said somewhere actually fell over landing on his leg causing the injury and unfortunately the left leg has never fully recovered since that time. He does note that there was some improvement initially and now it's pretty much stagnant. He does have venous stasis chronically although he states it's been worse and pass compared to current. He has also been diagnosed with lymphedema although again this is something else that has been deemed the worst in the past compared to present. He does have lymphedema pumps although he really has not been using those on a regular basis. Patient does have diabetes mellitus type II in other than the venous insufficiency and prayerful vascular disease he also has myasthenia gravis board she is on medications long-term. Currently he has been tolerating the medications for this very well he does have a list as noted above in the HPI of medications that he is to avoid that we need to keep in mind. Nonetheless the patient seems to be doing fairly well in general. I do believe his peripheral edema may be contributing to the nonhealing factor in regard to this ulcer. 09/16/17 on evaluation today patient appears to be doing rather well his wound actually appears to show signs of  improvement compared to previous evaluation which is great news. Overall his wound is a little larger but again I did debride the wound sharply last week so this is definitely expected as far as I was concerned. Nonetheless the wound bed itself seems to be  doing excellent. He is having no discomfort normally he does have a little bit of pain with cleansing of the wound by myself today. 09/23/17 on evaluation today patient actually appears to be showing signs of good improvement in regard to his left lateral motion the ulcer. He's been tolerating the dressing changes without complication. Fortunately there does not appear to be any evidence of infection at this point which is great news. Overall I'm extremely happy that he seems to be doing so well and the patient likewise wants to be healed as soon as possible he actually has a pull at home he would love to be in but he hasn't been able to get into this due to what's going on currently. 10/01/16 on evaluation today patient appears to be doing rather well in regard to his left lateral lower Trinity ulcer. He's been tolerating the dressing changes without complication. There does not appear to be any evidence of infection at this time. Overall I'm very pleased with the progress that has been made up to this point. I do believe after today will likely be able to switch to a different dressing. [name], [name] ([id number]) 10/08/17 on evaluation today patient actually appears to be doing very well at this point in time. His wound is showing signs of progress which is good news I am not even having to debride as much away as far as the slough is concerned. He does have some Slough which requires debridement at this point however. No fevers, chills, nausea, or vomiting noted at this time. 10/15/17 on evaluation today patient presents for follow-up concerning his ongoing lower extremity ulcer of the left lower extremity. He has been tolerating the dressing  changes without complication. With that being said he tells me at this point that his wound has not really been causing much in the way of discomfort. The wound itself seems to be making good progress with a good granular bed now at this point he did have some Slough on the surface of the wound this was minimal. He has good epithelialization from the anterior tourist posterior portion of the wound. 10/22/17 on evaluation today patient appears to be doing very well in regard to his left lateral lower extremity ulcer. He is shown signs of improvement which is good news. He's been tolerating the dressing changes without complication. In general I think he has done well with the wraps and though he has not made as much progress this week is he has in weeks past I still feel he's making good progress. We will see how things appear in one weeks time. Electronic Signature(s) Signed: 10/22/2017 9:24:47 PM By: Lenda Kelp PA-C Entered By: Lenda Kelp on 10/22/2017 10:25:58 [name] ([id number]) -------------------------------------------------------------------------------- Physical Exam Details Patient Name: [name], Douglas L. Date of Service: 10/22/2017 9:15 AM Medical Record Number: [id number] Patient Account Number: [id number] Date of Birth/Sex: 12/23/1950 (66 y.o. M) Treating RN: Phillis Haggis Primary Care Provider: Leotis Shames Other Clinician: Referring Provider: Leotis Shames Treating Provider/Extender: STONE III, HOYT Weeks in Treatment: 6 Constitutional Well-nourished and well-hydrated in no acute distress. Respiratory normal breathing without difficulty. clear to auscultation bilaterally. Cardiovascular regular rate and rhythm with normal S1, S2. 1+ pitting edema of the bilateral lower extremities. Psychiatric this patient is able to make decisions and demonstrates good insight into disease process. Alert and Oriented x 3. pleasant and cooperative. Notes Patient's  wound bed did not require sharp debridement today. He appears to be doing very well  in regard to the wound and in particular in regard to the healing/epithelialization that is noted. Overall I'm very pleased in this regard. Electronic Signature(s) Signed: 10/22/2017 9:24:47 PM By: Lenda Kelp PA-C Entered By: Lenda Kelp on 10/22/2017 10:27:09 MERION, GRIMALDO ([id number]) -------------------------------------------------------------------------------- Physician Orders Details Patient Name: [name], [name]. Date of Service: 10/22/2017 9:15 AM Medical Record Number: [id number] Patient Account Number: [id number] Date of Birth/Sex: 1950-07-24 (66 y.o. M) Treating RN: Huel Coventry Primary Care Provider: Leotis Shames Other Clinician: Referring Provider: Leotis Shames Treating Provider/Extender: Linwood Dibbles, HOYT Weeks in Treatment: 6 Verbal / Phone Orders: No Diagnosis Coding ICD-10 Coding Code Description E11.622 Type 2 diabetes mellitus with other skin ulcer 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 of other part of left lower leg with fat layer exposed I89.0 Lymphedema, not elsewhere classified G70.00 Myasthenia gravis without (acute) exacerbation I87.2 Venous insufficiency (chronic) (peripheral) I73.9 Peripheral vascular disease, unspecified Wound Cleansing Wound #1 Left,Lateral Lower Leg o Clean wound with Normal Saline. o Cleanse wound with mild soap and water Anesthetic (add to Medication List) Wound #1 Left,Lateral Lower Leg o Topical Lidocaine 4% cream applied to wound bed prior to debridement (In Clinic Only). Skin Barriers/Peri-Wound Care Wound #1 Left,Lateral Lower Leg o Moisturizing lotion Primary Wound Dressing Wound #1 Left,Lateral Lower Leg o Silver Collagen Secondary Dressing Wound #1 Left,Lateral Lower Leg o ABD pad o Non-adherent pad Dressing Change Frequency Wound #1 Left,Lateral Lower Leg o Change dressing every week Follow-up  Appointments Wound #1 Left,Lateral Lower Leg o Return Appointment in 1 week. o Nurse Visit as needed [name], [name] ([id number]) Edema Control Wound #1 Left,Lateral Lower Leg o 3 Layer Compression System - Left Lower Extremity - unna to anchor o Compression Pump: Use compression pump on left lower extremity for 30 minutes, twice daily. - work up to 1 hour twice daily o Compression Pump: Use compression pump on right lower extremity for 30 minutes, twice daily. - work up to 1 hour twice daily Additional Orders / Instructions Wound #1 Left,Lateral Lower Leg o Vitamin A; Vitamin C, Zinc Electronic Signature(s) Signed: 10/22/2017 9:24:47 PM By: Lenda Kelp PA-C Signed: 10/25/2017 6:17:48 PM By: Elliot Gurney, BSN, RN, CWS, Kim RN, BSN Entered By: Elliot Gurney, BSN, RN, CWS, Kim on 10/22/2017 09:52:52 [name], [name] ([id number]) -------------------------------------------------------------------------------- Problem List Details Patient Name: [name], [name]. Date of Service: 10/22/2017 9:15 AM Medical Record Number: [id number] Patient Account Number: [id number] Date of Birth/Sex: 09/15/1950 (66 y.o. M) Treating RN: Phillis Haggis Primary Care Provider: Leotis Shames Other Clinician: Referring Provider: Leotis Shames Treating Provider/Extender: Linwood Dibbles, HOYT Weeks in Treatment: 6 Active Problems ICD-10 Evaluated Encounter Code Description Active Date Today Diagnosis E11.622 Type 2 diabetes mellitus with other skin ulcer 09/09/2017 No Yes 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 of other part of left lower leg with 09/09/2017 No Yes fat layer exposed I89.0 Lymphedema, not elsewhere classified 09/09/2017 No Yes G70.00 Myasthenia gravis without (acute) exacerbation 09/09/2017 No Yes I87.2 Venous insufficiency (chronic) (peripheral) 09/09/2017 No Yes I73.9 Peripheral vascular disease, unspecified 09/09/2017 No Yes Inactive Problems Resolved Problems Electronic Signature(s) Signed:  10/22/2017 9:24:47 PM By: Lenda Kelp PA-C Entered By: Lenda Kelp on 10/22/2017 09:48:28 Mincey, [name] ([id number]) -------------------------------------------------------------------------------- Progress Note Details Patient Name: [name]. Date of Service: 10/22/2017 9:15 AM Medical Record Number: [id number] Patient Account Number: [id number] Date of Birth/Sex: 02-Mar-1951 (66 y.o. M) Treating RN: Phillis Haggis Primary Care Provider: Leotis Shames Other Clinician: Referring Provider: Leotis Shames Treating Provider/Extender: STONE III, HOYT Weeks in Treatment:  6 Subjective Chief Complaint Information obtained from Patient Left leg ulcer History of Present Illness (HPI) MYASTHENIA GRAVIS (MG) MEDICATION ALERT: Medications that increase weakness in most MG patients should be avoided or used only with great caution. These include: - Fluoroquinolone antibiotics: moxifloxacin (Avelox), ciprofloxacin (Cipro, Proquin XR), norfloxacin (Noroxin), levofloxacin (Levaquin), ofloxacin (Floxin), gemifloxacin (Factive) - Aminoglycoside antibiotics: tobramycin, gentamycin, kanamycin, neomycin, streptomycin - Macrolide antibiotics: erythromycin, azithromycin (Z-Pak, Zithromax), telithromycin (Ketek) - Botulinum toxins: Botox, Myobloc, Dysport, Xeomin - Beta-blockers such as propanolol or timolol eyedrops (Timoptic) - Calcium channel blockers (blood pressure medications) - Quinine, quinidine or procainamide - [name]-penicillamine (do not confuse with penicillin) - Interferon - Magnesium salts: milk of magnesia, some antacids (Maalox, Mylanta) - Curare and related drugs (usually used only during surgery) Other medications may produce problems in some MG patients. Be sure your doctor/dentist knows you have MG when any new medication is prescribed. Contact your myasthenia doctor if you or your local doctor has questions about medications. 09/09/17 on  evaluation today patient actually appears for initial evaluation today concerning his left lateral lower extremity ulcer which has been present he tells me since November 2018. He states that gas logs that he'[name] forgotten he had said somewhere actually fell over landing on his leg causing the injury and unfortunately the left leg has never fully recovered since that time. He does note that there was some improvement initially and now it's pretty much stagnant. He does have venous stasis chronically although he states it's been worse and pass compared to current. He has also been diagnosed with lymphedema although again this is something else that has been deemed the worst in the past compared to present. He does have lymphedema pumps although he really has not been using those on a regular basis. Patient does have diabetes mellitus type II in other than the venous insufficiency and prayerful vascular disease he also has myasthenia gravis board she is on medications long-term. Currently he has been tolerating the medications for this very well he does have a list as noted above in the HPI of medications that he is to avoid that we need to keep in mind. Nonetheless the patient seems to be doing fairly well in general. I do believe his peripheral edema may be contributing to the nonhealing factor in regard to this ulcer. 09/16/17 on evaluation today patient appears to be doing rather well his wound actually appears to show signs of improvement compared to previous evaluation which is great news. Overall his wound is a little larger but again I did debride the wound sharply last week so this is definitely expected as far as I was concerned. Nonetheless the wound bed itself seems to be doing excellent. He is having no discomfort normally he does have a little bit of pain with cleansing of the wound by myself today. 09/23/17 on evaluation today patient actually appears to be showing signs of good improvement  in regard to his left lateral motion the ulcer. He's been tolerating the dressing changes without complication. Fortunately there does not appear to be any evidence of infection at this point which is great news. Overall I'm extremely happy that he seems to be doing so well and the patient likewise wants to be healed as soon as possible he actually has a pull at home he would love to be in but he hasn't [name], Douglas L. ([id number]) been able to get into this due to what's going on currently. 10/01/16 on evaluation today patient appears to be  doing rather well in regard to his left lateral lower Trinity ulcer. He's been tolerating the dressing changes without complication. There does not appear to be any evidence of infection at this time. Overall I'm very pleased with the progress that has been made up to this point. I do believe after today will likely be able to switch to a different dressing. 10/08/17 on evaluation today patient actually appears to be doing very well at this point in time. His wound is showing signs of progress which is good news I am not even having to debride as much away as far as the slough is concerned. He does have some Slough which requires debridement at this point however. No fevers, chills, nausea, or vomiting noted at this time. 10/15/17 on evaluation today patient presents for follow-up concerning his ongoing lower extremity ulcer of the left lower extremity. He has been tolerating the dressing changes without complication. With that being said he tells me at this point that his wound has not really been causing much in the way of discomfort. The wound itself seems to be making good progress with a good granular bed now at this point he did have some Slough on the surface of the wound this was minimal. He has good epithelialization from the anterior tourist posterior portion of the wound. 10/22/17 on evaluation today patient appears to be doing very well in regard to  his left lateral lower extremity ulcer. He is shown signs of improvement which is good news. He's been tolerating the dressing changes without complication. In general I think he has done well with the wraps and though he has not made as much progress this week is he has in weeks past I still feel he's making good progress. We will see how things appear in one weeks time. Patient History Information obtained from Patient. Family History Cancer - Father, Heart Disease - Father, No family history of Diabetes, Hereditary Spherocytosis, Hypertension, Kidney Disease, Lung Disease, Seizures, Stroke, Thyroid Problems, Tuberculosis. Social History Never smoker, Marital Status - Married, Alcohol Use - Never, Drug Use - No History, Caffeine Use - Daily. Review of Systems (ROS) Constitutional Symptoms (General Health) Denies complaints or symptoms of Fever, Chills. Respiratory The patient has no complaints or symptoms. Cardiovascular Complains or has symptoms of LE edema. Psychiatric The patient has no complaints or symptoms. Objective Constitutional Well-nourished and well-hydrated in no acute distress. Vitals Time Taken: 9:30 AM, Height: 72 in, Weight: 323.9 lbs, BMI: 43.9, Temperature: 98.1 F, Pulse: 55 bpm, Respiratory Rate: 16 breaths/min, Blood Pressure: 143/62 mmHg. [name], Bard L. ([id number]) Respiratory normal breathing without difficulty. clear to auscultation bilaterally. Cardiovascular regular rate and rhythm with normal S1, S2. 1+ pitting edema of the bilateral lower extremities. Psychiatric this patient is able to make decisions and demonstrates good insight into disease process. Alert and Oriented x 3. pleasant and cooperative. General Notes: Patient's wound bed did not require sharp debridement today. He appears to be doing very well in regard to the wound and in particular in regard to the healing/epithelialization that is noted. Overall I'm very pleased in this  regard. Integumentary (Hair, Skin) Wound #1 status is Open. Original cause of wound was Gradually Appeared. The wound is located on the Left,Lateral Lower Leg. The wound measures 1.8cm length x 1.3cm width x 0.3cm depth; 1.838cm^2 area and 0.551cm^3 volume. There is Fat Layer (Subcutaneous Tissue) Exposed exposed. There is no tunneling or undermining noted. There is a small amount of serosanguineous drainage noted. The wound  margin is flat and intact. There is large (67-100%) red, pink granulation within the wound bed. There is a small (1-33%) amount of necrotic tissue within the wound bed including Adherent Slough. The periwound skin appearance exhibited: Erythema. The periwound skin appearance did not exhibit: Callus, Crepitus, Excoriation, Induration, Rash, Scarring, Dry/Scaly, Maceration, Atrophie Blanche, Cyanosis, Ecchymosis, Hemosiderin Staining, Mottled, Pallor, Rubor. The surrounding wound skin color is noted with erythema which is circumferential. Periwound temperature was noted as No Abnormality. The periwound has tenderness on palpation. Assessment Active Problems ICD-10 Type 2 diabetes mellitus with other skin ulcer Non-pressure chronic ulcer of other part of left lower leg with fat layer exposed Lymphedema, not elsewhere classified Myasthenia gravis without (acute) exacerbation Venous insufficiency (chronic) (peripheral) Peripheral vascular disease, unspecified Plan Wound Cleansing: Wound #1 Left,Lateral Lower Leg: Clean wound with Normal Saline. Cleanse wound with mild soap and water Anesthetic (add to Medication List): Wound #1 Left,Lateral Lower Leg: Topical Lidocaine 4% cream applied to wound bed prior to debridement (In Clinic Only). Skin Barriers/Peri-Wound Care: Wound #1 Left,Lateral Lower Leg: [name], Dushawn L. ([id number]) Moisturizing lotion Primary Wound Dressing: Wound #1 Left,Lateral Lower Leg: Silver Collagen Secondary Dressing: Wound #1 Left,Lateral  Lower Leg: ABD pad Non-adherent pad Dressing Change Frequency: Wound #1 Left,Lateral Lower Leg: Change dressing every week Follow-up Appointments: Wound #1 Left,Lateral Lower Leg: Return Appointment in 1 week. Nurse Visit as needed Edema Control: Wound #1 Left,Lateral Lower Leg: 3 Layer Compression System - Left Lower Extremity - unna to anchor Compression Pump: Use compression pump on left lower extremity for 30 minutes, twice daily. - work up to 1 hour twice daily Compression Pump: Use compression pump on right lower extremity for 30 minutes, twice daily. - work up to 1 hour twice daily Additional Orders / Instructions: Wound #1 Left,Lateral Lower Leg: Vitamin A; Vitamin C, Zinc At this time my suggestion is that we continue with the Current wound care measures for the next week. He's in agreement the plan. If everything continues to progress nicely then we may continue if not I might consider Endoform at follow-up. The other option would be a skin substitute. Nonetheless I'm interested to see how things progress in the next week. Please see above for specific wound care orders. We will see patient for re-evaluation in 1 week(s) here in the clinic. If anything worsens or changes patient will contact our office for additional recommendations. Electronic Signature(s) Signed: 10/22/2017 9:24:47 PM By: Lenda KelpStone III, Hoyt PA-C Entered By: Lenda KelpStone III, Hoyt on 10/22/2017 10:28:23 [name], Uzziel L. ([id number]) -------------------------------------------------------------------------------- ROS/PFSH Details Patient Name: [name], Didier L. Date of Service: 10/22/2017 9:15 AM Medical Record Number: [id number] Patient Account Number: [id number] Date of Birth/Sex: 1950-10-10 (66 y.o. M) Treating RN: Phillis HaggisPinkerton, Debi Primary Care Provider: Leotis ShamesSINGH, JASMINE Other Clinician: Referring Provider: Leotis ShamesSINGH, JASMINE Treating Provider/Extender: STONE III, HOYT Weeks in Treatment: 6 Information Obtained  From Patient Wound History Do you currently have one or more open woundso Yes How many open wounds do you currently haveo 1 Approximately how long have you had your woundso nov 2018 How have you been treating your wound(s) until nowo band-aide, neo sporinHas your wound(s) ever healed and then re-openedo No Have you had any lab work done in the past montho No Have you tested positive for an antibiotic resistant organism (MRSA, VRE)o No Have you tested positive for osteomyelitis (bone infection)o No Have you had any tests for circulation on your legso No Have you had other problems associated with your woundso Swelling Constitutional Symptoms (General  Health) Complaints and Symptoms: Negative for: Fever; Chills Cardiovascular Complaints and Symptoms: Positive for: LE edema Medical History: Positive for: Hypertension Eyes Medical History: Positive for: Cataracts - surgery Hematologic/Lymphatic Medical History: Positive for: Anemia; Lymphedema Respiratory Complaints and Symptoms: No Complaints or Symptoms Endocrine Medical History: Positive for: Type II Diabetes Time with diabetes: 15 yrs Treated with: Insulin, Oral agents PRIEST, LOCKRIDGE ([id number]) Blood sugar tested every day: Yes Tested : Psychiatric Complaints and Symptoms: No Complaints or Symptoms HBO Extended History Items Eyes: Cataracts Immunizations Pneumococcal Vaccine: Received Pneumococcal Vaccination: Yes Implantable Devices Family and Social History Cancer: Yes - Father; Diabetes: No; Heart Disease: Yes - Father; Hereditary Spherocytosis: No; Hypertension: No; Kidney Disease: No; Lung Disease: No; Seizures: No; Stroke: No; Thyroid Problems: No; Tuberculosis: No; Never smoker; Marital Status - Married; Alcohol Use: Never; Drug Use: No History; Caffeine Use: Daily; Financial Concerns: No; Food, Clothing or Shelter Needs: No; Support System Lacking: No; Transportation Concerns: No; Advanced Directives:  No; Patient does not want information on Advanced Directives; Do not resuscitate: No; Living Will: Yes (Copy provided); Medical Power of Attorney: No Physician Affirmation I have reviewed and agree with the above information. Electronic Signature(s) Signed: 10/22/2017 9:24:47 PM By: Lenda Kelp PA-C Signed: 10/28/2017 4:59:33 PM By: Alejandro Mulling Entered By: Lenda Kelp on 10/22/2017 10:26:29 KARIN, GRIFFITH ([id number]) -------------------------------------------------------------------------------- SuperBill Details Patient Name: JAIYDEN, LAUR. Date of Service: 10/22/2017 Medical Record Number: [id number] Patient Account Number: [id number] Date of Birth/Sex: April 10, 1950 (66 y.o. M) Treating RN: Huel Coventry Primary Care Provider: Leotis Shames Other Clinician: Referring Provider: Leotis Shames Treating Provider/Extender: Linwood Dibbles, HOYT Weeks in Treatment: 6 Diagnosis Coding ICD-10 Codes Code Description E11.622 Type 2 diabetes mellitus with other skin ulcer 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 of other part of left lower leg with fat layer exposed I89.0 Lymphedema, not elsewhere classified G70.00 Myasthenia gravis without (acute) exacerbation I87.2 Venous insufficiency (chronic) (peripheral) I73.9 Peripheral vascular disease, unspecified Facility Procedures CPT4 Code: [id number] Description: (Facility Use Only) [phone] - APPLY MULTLAY COMPRS LWR LT LEG Modifier: Quantity: 1 Physician Procedures CPT4 Code Description: [id number] - WC PHYS LEVEL 3 - EST PT ICD-10 Diagnosis Description E11.622 Type 2 diabetes mellitus with other skin ulcer 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 of other part of left lower leg wit I89.0 Lymphedema, not elsewhere  classified G70.00 Myasthenia gravis without (acute) exacerbation Modifier: h fat layer expos Quantity: 1 ed Electronic Signature(s) Signed: 10/22/2017 9:24:47 PM By: Lenda Kelp PA-C Entered By: Lenda Kelp on 10/22/2017  10:28:52

## 2017-11-08 NOTE — Progress Notes (Signed)
COOLIDGE, GOSSARD ([id number]) Visit Report for 10/29/2017 Chief Complaint Document Details Patient Name: [name], [name]. Date of Service: 10/29/2017 10:00 AM Medical Record Number: [id number] Patient Account Number: [id number] Date of Birth/Sex: 21-Aug-1950 (66 y.o. M) Treating RN: Phillis Haggis Primary Care Provider: Leotis Shames Other Clinician: Referring Provider: Leotis Shames Treating Provider/Extender: Kathreen Cosier in Treatment: 7 Information Obtained from: Patient Chief Complaint Left leg ulcer Electronic Signature(s) Signed: 10/29/2017 10:37:17 AM By: Bonnell Public Entered By: Bonnell Public on 10/29/2017 10:37:16 Douglas Loll ([id number]) -------------------------------------------------------------------------------- Debridement Details Patient Name: [name], [name]. Date of Service: 10/29/2017 10:00 AM Medical Record Number: [id number] Patient Account Number: [id number] Date of Birth/Sex: 1950-10-06 (66 y.o. M) Treating RN: Phillis Haggis Primary Care Provider: Leotis Shames Other Clinician: Referring Provider: Leotis Shames Treating Provider/Extender: Kathreen Cosier in Treatment: 7 Debridement Performed for Wound #1 Left,Lateral Lower Leg Assessment: Performed By: Physician Bonnell Public, NP Debridement Type: Debridement Severity of Tissue Pre Fat layer exposed Debridement: Pre-procedure Verification/Time Yes - 10:25 Out Taken: Start Time: 10:25 Pain Control: Lidocaine 4% Topical Solution Total Area Debrided (Wright x W): 1.4 (cm) x 1 (cm) = 1.4 (cm) Tissue and other material Viable, Non-Viable, Slough, Subcutaneous, Fibrin/Exudate, Slough debrided: Level: Skin/Subcutaneous Tissue Debridement Description: Excisional Instrument: Curette Bleeding: Minimum Hemostasis Achieved: Pressure End Time: 10:27 Procedural Pain: 0 Post Procedural Pain: 0 Response to Treatment: Procedure was tolerated well Level of Consciousness: Awake and  Alert Post Debridement Measurements of Total Wound Length: (cm) 1.4 Width: (cm) 1 Depth: (cm) 0.3 Volume: (cm) 0.33 Character of Wound/Ulcer Post Debridement: Requires Further Debridement Severity of Tissue Post Debridement: Fat layer exposed Post Procedure Diagnosis Same as Pre-procedure Electronic Signature(s) Signed: 10/29/2017 10:37:08 AM By: Bonnell Public Signed: 10/29/2017 3:52:40 PM By: Alejandro Mulling Entered By: Bonnell Public on 10/29/2017 10:37:08 Douglas Loll ([id number]) -------------------------------------------------------------------------------- HPI Details Patient Name: [name], [name]. Date of Service: 10/29/2017 10:00 AM Medical Record Number: [id number] Patient Account Number: [id number] Date of Birth/Sex: 05-07-1950 (66 y.o. M) Treating RN: Phillis Haggis Primary Care Provider: Leotis Shames Other Clinician: Referring Provider: Leotis Shames Treating Provider/Extender: Kathreen Cosier in Treatment: 7 History of Present Illness HPI Description: MYASTHENIA GRAVIS (MG) MEDICATION ALERT: Medications that increase weakness in most MG patients should be avoided or used only with great caution. These include: - Fluoroquinolone antibiotics: moxifloxacin (Aveloxo), ciprofloxacin (Ciproo, Proquin XRo), norfloxacin (Noroxino), levofloxacin (Levaquino), ofloxacin (Floxino), gemifloxacin (Factiveo) - Aminoglycoside antibiotics: tobramycin, gentamycin, kanamycin, neomycin, streptomycin - Macrolide antibiotics: erythromycin, azithromycin (Z-Pako, Zithromaxo), telithromycin (Keteko) - Botulinum toxins: Botoxo, Myobloco, Dysporto, Xeomino - Beta-blockers such as propanolol or timolol eyedrops (Timoptico) - Calcium channel blockers (blood pressure medications) - Quinine, quinidine or procainamide - D-penicillamine (do not confuse with penicillin) - Interferon - Magnesium salts: milk of magnesia, some antacids (Maaloxo, Mylantao) - Curare and related drugs  (usually used only during surgery) Other medications may produce problems in some MG patients. Be sure your doctor/dentist knows you have MG when any new medication is prescribed. Contact your myasthenia doctor if you or your local doctor has questions about medications. 09/09/17 on evaluation today patient actually appears for initial evaluation today concerning his left lateral lower extremity ulcer which has been present he tells me since November 2018. He states that gas logs that he'd forgotten he had said somewhere actually fell over landing on his leg causing the injury and unfortunately the left leg has never fully recovered since that time. He does note that there was some improvement initially and now it's pretty much  stagnant. He does have venous stasis chronically although he states it's been worse and pass compared to current. He has also been diagnosed with lymphedema although again this is something else that has been deemed the worst in the past compared to present. He does have lymphedema pumps although he really has not been using those on a regular basis. Patient does have diabetes mellitus type II in other than the venous insufficiency and prayerful vascular disease he also has myasthenia gravis board she is on medications long-term. Currently he has been tolerating the medications for this very well he does have a list as noted above in the HPI of medications that he is to avoid that we need to keep in mind. Nonetheless the patient seems to be doing fairly well in general. I do believe his peripheral edema may be contributing to the nonhealing factor in regard to this ulcer. 09/16/17 on evaluation today patient appears to be doing rather well his wound actually appears to show signs of improvement compared to previous evaluation which is great news. Overall his wound is a little larger but again I did debride the wound sharply last week so this is definitely expected as far as I  was concerned. Nonetheless the wound bed itself seems to be doing excellent. He is having no discomfort normally he does have a little bit of pain with cleansing of the wound by myself today. 09/23/17 on evaluation today patient actually appears to be showing signs of good improvement in regard to his left lateral motion the ulcer. He's been tolerating the dressing changes without complication. Fortunately there does not appear to be any evidence of infection at this point which is great news. Overall I'm extremely happy that he seems to be doing so well and the patient likewise wants to be healed as soon as possible he actually has a pull at home he would love to be in but he hasn't been able to get into this due to what's going on currently. 10/01/16 on evaluation today patient appears to be doing rather well in regard to his left lateral lower Trinity ulcer. He's been tolerating the dressing changes without complication. There does not appear to be any evidence of infection at this time. Overall I'm very pleased with the progress that has been made up to this point. I do believe after today will likely be able to switch to a different dressing. [name], [name] ([id number]) 10/08/17 on evaluation today patient actually appears to be doing very well at this point in time. His wound is showing signs of progress which is good news I am not even having to debride as much away as far as the slough is concerned. He does have some Slough which requires debridement at this point however. No fevers, chills, nausea, or vomiting noted at this time. 10/15/17 on evaluation today patient presents for follow-up concerning his ongoing lower extremity ulcer of the left lower extremity. He has been tolerating the dressing changes without complication. With that being said he tells me at this point that his wound has not really been causing much in the way of discomfort. The wound itself seems to be making good  progress with a good granular bed now at this point he did have some Slough on the surface of the wound this was minimal. He has good epithelialization from the anterior tourist posterior portion of the wound. 10/22/17 on evaluation today patient appears to be doing very well in regard to his left lateral  lower extremity ulcer. He is shown signs of improvement which is good news. He's been tolerating the dressing changes without complication. In general I think he has done well with the wraps and though he has not made as much progress this week is he has in weeks past I still feel he's making good progress. We will see how things appear in one weeks time. 10/29/17-He is here in follow-up evaluation for a left lower extremity ulcer. He is tolerating dressing changes and voicing no complaints or concerns. We will continue with the same treatment plan, he will return for nurse visit on Monday and follow-up in 2 weeks. Electronic Signature(s) Signed: 10/29/2017 10:37:50 AM By: Bonnell Public Entered By: Bonnell Public on 10/29/2017 10:37:50 Douglas Loll ([id number]) -------------------------------------------------------------------------------- Physician Orders Details Patient Name: [name], SILSBY. Date of Service: 10/29/2017 10:00 AM Medical Record Number: [id number] Patient Account Number: [id number] Date of Birth/Sex: November 23, 1950 (66 y.o. M) Treating RN: Phillis Haggis Primary Care Provider: Leotis Shames Other Clinician: Referring Provider: Leotis Shames Treating Provider/Extender: Kathreen Cosier in Treatment: 7 Verbal / Phone Orders: Yes Clinician: Pinkerton, Debi Read Back and Verified: Yes Diagnosis Coding Wound Cleansing Wound #1 Left,Lateral Lower Leg o Clean wound with Normal Saline. o Cleanse wound with mild soap and water Anesthetic (add to Medication List) Wound #1 Left,Lateral Lower Leg o Topical Lidocaine 4% cream applied to wound bed prior to debridement  (In Clinic Only). Skin Barriers/Peri-Wound Care Wound #1 Left,Lateral Lower Leg o Moisturizing lotion Primary Wound Dressing Wound #1 Left,Lateral Lower Leg o Silver Collagen Secondary Dressing Wound #1 Left,Lateral Lower Leg o ABD pad o Non-adherent pad Dressing Change Frequency Wound #1 Left,Lateral Lower Leg o Change dressing every week Follow-up Appointments Wound #1 Left,Lateral Lower Leg o Return Appointment in 1 week. o Nurse Visit as needed Edema Control Wound #1 Left,Lateral Lower Leg o 3 Layer Compression System - Left Lower Extremity - unna to anchor o Compression Pump: Use compression pump on left lower extremity for 30 minutes, twice daily. - work up to 1 hour twice daily o Compression Pump: Use compression pump on right lower extremity for 30 minutes, twice daily. - work up to 1 hour twice daily Additional Orders / Instructions Wound #1 Left,Lateral Lower Leg [name], [name] ([id number]) o Vitamin A; Vitamin C, Zinc Patient Medications Allergies: penicillin, Omnicef Notifications Medication Indication Start End lidocaine DOSE 1 - topical 4 % cream - 1 cream topical Electronic Signature(s) Signed: 10/29/2017 3:45:39 PM By: Bonnell Public Signed: 10/29/2017 3:52:40 PM By: Alejandro Mulling Entered By: Alejandro Mulling on 10/29/2017 10:27:22 [name], [name] ([id number]) -------------------------------------------------------------------------------- Prescription 10/29/2017 Patient Name: Douglas Huh Wright. Provider: Bonnell Public NP Date of Birth: Dec 28, 1950 NPI#: [id number] Sex: Judie Petit DEA#: [id number] Phone #: [phone] License #: Patient Address: [facility] [address] [facility] [place], [place] [address] [address] [place], [place] [address] [phone] Allergies penicillin Omnicef Medication Medication: Route: Strength: Form: lidocaine topical 4%  cream Class: TOPICAL LOCAL ANESTHETICS Dose: Frequency / Time: Indication: 1 1 cream topical Number of Refills: Number of Units: 0 Generic Substitution: Start Date: End Date: Administered at Substitution Permitted Facility: Yes Time Administered: Time Discontinued: Note to Pharmacy: Signature(s): Date(s): Electronic Signature(s) Signed: 10/29/2017 3:45:39 PM By: Bonnell Public Signed: 10/29/2017 3:52:40 PM By: Sharlotte Alamo, [name] ([id number]) Entered By: Alejandro Mulling on 10/29/2017 10:27:23 Douglas Loll ([id number]) --------------------------------------------------------------------------------  Problem List Details Patient Name: MOHAB, ASHBY Wright. Date of Service: 10/29/2017 10:00  AM Medical Record Number: [id number] Patient Account Number: [id number] Date of Birth/Sex: 1950/12/26 (66 y.o. M) Treating RN: Phillis HaggisPinkerton, Debi Primary Care Provider: Leotis ShamesSINGH, JASMINE Other Clinician: Referring Provider: Leotis ShamesSINGH, JASMINE Treating Provider/Extender: Kathreen Cosieroulter, Tiyonna Sardinha Weeks in Treatment: 7 Active Problems ICD-10 Evaluated Encounter Code Description Active Date Today Diagnosis 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 of other part of left lower leg with 09/09/2017 No Yes fat layer exposed E11.622 Type 2 diabetes mellitus with other skin ulcer 09/09/2017 No Yes I89.0 Lymphedema, not elsewhere classified 09/09/2017 No Yes G70.00 Myasthenia gravis without (acute) exacerbation 09/09/2017 No Yes I87.2 Venous insufficiency (chronic) (peripheral) 09/09/2017 No Yes I73.9 Peripheral vascular disease, unspecified 09/09/2017 No Yes Inactive Problems Resolved Problems Electronic Signature(s) Signed: 10/29/2017 10:36:23 AM By: Bonnell Publicoulter, Tremane Spurgeon Entered By: Bonnell Publicoulter, Qais Jowers on 10/29/2017 10:36:23 [name], Zakhi Wright. ([id number]) -------------------------------------------------------------------------------- Progress Note Details Patient Name: [name], [name]. Date of Service: 10/29/2017 10:00  AM Medical Record Number: [id number] Patient Account Number: [id number] Date of Birth/Sex: 1950/12/26 (66 y.o. M) Treating RN: Phillis HaggisPinkerton, Debi Primary Care Provider: Leotis ShamesSINGH, JASMINE Other Clinician: Referring Provider: Leotis ShamesSINGH, JASMINE Treating Provider/Extender: Kathreen Cosieroulter, Nykerria Macconnell Weeks in Treatment: 7 Subjective Chief Complaint Information obtained from Patient Left leg ulcer History of Present Illness (HPI) MYASTHENIA GRAVIS (MG) MEDICATION ALERT: Medications that increase weakness in most MG patients should be avoided or used only with great caution. These include: - Fluoroquinolone antibiotics: moxifloxacin (Avelox), ciprofloxacin (Cipro, Proquin XR), norfloxacin (Noroxin), levofloxacin (Levaquin), ofloxacin (Floxin), gemifloxacin (Factive) - Aminoglycoside antibiotics: tobramycin, gentamycin, kanamycin, neomycin, streptomycin - Macrolide antibiotics: erythromycin, azithromycin (Z-Pak, Zithromax), telithromycin (Ketek) - Botulinum toxins: Botox, Myobloc, Dysport, Xeomin - Beta-blockers such as propanolol or timolol eyedrops (Timoptic) - Calcium channel blockers (blood pressure medications) - Quinine, quinidine or procainamide - D-penicillamine (do not confuse with penicillin) - Interferon - Magnesium salts: milk of magnesia, some antacids (Maalox, Mylanta) - Curare and related drugs (usually used only during surgery) Other medications may produce problems in some MG patients. Be sure your doctor/dentist knows you have MG when any new medication is prescribed. Contact your myasthenia doctor if you or your local doctor has questions about medications. 09/09/17 on evaluation today patient actually appears for initial evaluation today concerning his left lateral lower extremity ulcer which has been present he tells me since November 2018. He states that gas logs that he'd forgotten he had said somewhere actually fell over landing on his leg causing the injury  and unfortunately the left leg has never fully recovered since that time. He does note that there was some improvement initially and now it's pretty much stagnant. He does have venous stasis chronically although he states it's been worse and pass compared to current. He has also been diagnosed with lymphedema although again this is something else that has been deemed the worst in the past compared to present. He does have lymphedema pumps although he really has not been using those on a regular basis. Patient does have diabetes mellitus type II in other than the venous insufficiency and prayerful vascular disease he also has myasthenia gravis board she is on medications long-term. Currently he has been tolerating the medications for this very well he does have a list as noted above in the HPI of medications that he is to avoid that we need to keep in mind. Nonetheless the patient seems to be doing fairly well in general. I do believe his peripheral edema may be contributing to the nonhealing factor in regard to this ulcer. 09/16/17 on evaluation today patient appears to be doing rather well his  wound actually appears to show signs of improvement compared to previous evaluation which is great news. Overall his wound is a little larger but again I did debride the wound sharply last week so this is definitely expected as far as I was concerned. Nonetheless the wound bed itself seems to be doing excellent. He is having no discomfort normally he does have a little bit of pain with cleansing of the wound by myself today. 09/23/17 on evaluation today patient actually appears to be showing signs of good improvement in regard to his left lateral motion the ulcer. He's been tolerating the dressing changes without complication. Fortunately there does not appear to be any evidence of infection at this point which is great news. Overall I'm extremely happy that he seems to be doing so well and the patient likewise  wants to be healed as soon as possible he actually has a pull at home he would love to be in but he hasn't [name], [name]. ([id number]) been able to get into this due to what's going on currently. 10/01/16 on evaluation today patient appears to be doing rather well in regard to his left lateral lower Trinity ulcer. He's been tolerating the dressing changes without complication. There does not appear to be any evidence of infection at this time. Overall I'm very pleased with the progress that has been made up to this point. I do believe after today will likely be able to switch to a different dressing. 10/08/17 on evaluation today patient actually appears to be doing very well at this point in time. His wound is showing signs of progress which is good news I am not even having to debride as much away as far as the slough is concerned. He does have some Slough which requires debridement at this point however. No fevers, chills, nausea, or vomiting noted at this time. 10/15/17 on evaluation today patient presents for follow-up concerning his ongoing lower extremity ulcer of the left lower extremity. He has been tolerating the dressing changes without complication. With that being said he tells me at this point that his wound has not really been causing much in the way of discomfort. The wound itself seems to be making good progress with a good granular bed now at this point he did have some Slough on the surface of the wound this was minimal. He has good epithelialization from the anterior tourist posterior portion of the wound. 10/22/17 on evaluation today patient appears to be doing very well in regard to his left lateral lower extremity ulcer. He is shown signs of improvement which is good news. He's been tolerating the dressing changes without complication. In general I think he has done well with the wraps and though he has not made as much progress this week is he has in weeks past I still feel  he's making good progress. We will see how things appear in one weeks time. 10/29/17-He is here in follow-up evaluation for a left lower extremity ulcer. He is tolerating dressing changes and voicing no complaints or concerns. We will continue with the same treatment plan, he will return for nurse visit on Monday and follow-up in 2 weeks. Objective Constitutional Vitals Time Taken: 10:07 AM, Height: 72 in, Weight: 323.9 lbs, BMI: 43.9, Temperature: 97.9 F, Pulse: 61 bpm, Respiratory Rate: 16 breaths/min, Blood Pressure: 153/72 mmHg. Integumentary (Hair, Skin) Wound #1 status is Open. Original cause of wound was Gradually Appeared. The wound is located on the Left,Lateral Lower Leg. The wound  measures 1.4cm length x 1cm width x 0.2cm depth; 1.1cm^2 area and 0.22cm^3 volume. There is Fat Layer (Subcutaneous Tissue) Exposed exposed. There is no tunneling or undermining noted. There is a small amount of serosanguineous drainage noted. The wound margin is flat and intact. There is large (67-100%) red, pink granulation within the wound bed. There is a small (1-33%) amount of necrotic tissue within the wound bed including Adherent Slough. The periwound skin appearance exhibited: Erythema. The periwound skin appearance did not exhibit: Callus, Crepitus, Excoriation, Induration, Rash, Scarring, Dry/Scaly, Maceration, Atrophie Blanche, Cyanosis, Ecchymosis, Hemosiderin Staining, Mottled, Pallor, Rubor. The surrounding wound skin color is noted with erythema which is circumferential. Periwound temperature was noted as No Abnormality. The periwound has tenderness on palpation. Assessment Active Problems [name], [name] ([id number]) ICD-10 Non-pressure chronic ulcer of other part of left lower leg with fat layer exposed Type 2 diabetes mellitus with other skin ulcer Lymphedema, not elsewhere classified Myasthenia gravis without (acute) exacerbation Venous insufficiency (chronic)  (peripheral) Peripheral vascular disease, unspecified Procedures Wound #1 Pre-procedure diagnosis of Wound #1 is a Diabetic Wound/Ulcer of the Lower Extremity located on the Left,Lateral Lower Leg .Severity of Tissue Pre Debridement is: Fat layer exposed. There was a Excisional Skin/Subcutaneous Tissue Debridement with a total area of 1.4 sq cm performed by Bonnell Public, NP. With the following instrument(s): Curette to remove Viable and Non-Viable tissue/material. Material removed includes Subcutaneous Tissue, Slough, and Fibrin/Exudate after achieving pain control using Lidocaine 4% Topical Solution. No specimens were taken. A time out was conducted at 10:25, prior to the start of the procedure. A Minimum amount of bleeding was controlled with Pressure. The procedure was tolerated well with a pain level of 0 throughout and a pain level of 0 following the procedure. Patient s Level of Consciousness post procedure was recorded as Awake and Alert. Post Debridement Measurements: 1.4cm length x 1cm width x 0.3cm depth; 0.33cm^3 volume. Character of Wound/Ulcer Post Debridement requires further debridement. Severity of Tissue Post Debridement is: Fat layer exposed. Post procedure Diagnosis Wound #1: Same as Pre-Procedure Plan Wound Cleansing: Wound #1 Left,Lateral Lower Leg: Clean wound with Normal Saline. Cleanse wound with mild soap and water Anesthetic (add to Medication List): Wound #1 Left,Lateral Lower Leg: Topical Lidocaine 4% cream applied to wound bed prior to debridement (In Clinic Only). Skin Barriers/Peri-Wound Care: Wound #1 Left,Lateral Lower Leg: Moisturizing lotion Primary Wound Dressing: Wound #1 Left,Lateral Lower Leg: Silver Collagen Secondary Dressing: Wound #1 Left,Lateral Lower Leg: ABD pad Non-adherent pad Dressing Change Frequency: Wound #1 Left,Lateral Lower Leg: Change dressing every week Follow-up Appointments: Wound #1 Left,Lateral Lower Leg: IZRAEL, PEAK. ([id number]) Return Appointment in 1 week. Nurse Visit as needed Edema Control: Wound #1 Left,Lateral Lower Leg: 3 Layer Compression System - Left Lower Extremity - unna to anchor Compression Pump: Use compression pump on left lower extremity for 30 minutes, twice daily. - work up to 1 hour twice daily Compression Pump: Use compression pump on right lower extremity for 30 minutes, twice daily. - work up to 1 hour twice daily Additional Orders / Instructions: Wound #1 Left,Lateral Lower Leg: Vitamin A; Vitamin C, Zinc The following medication(s) was prescribed: lidocaine topical 4 % cream 1 1 cream topical was prescribed at facility Electronic Signature(s) Signed: 10/29/2017 11:17:58 AM By: Bonnell Public Entered By: Bonnell Public on 10/29/2017 11:17:58 Sturgeon, [name] ([id number]) -------------------------------------------------------------------------------- SuperBill Details Patient Name: Douglas Loll. Date of Service: 10/29/2017 Medical Record Number: [id number] Patient Account Number: [id number] Date of Birth/Sex: 1950/07/08 (  67 y.o. M) Treating RN: Phillis HaggisPinkerton, Debi Primary Care Provider: Leotis ShamesSINGH, JASMINE Other Clinician: Referring Provider: Leotis ShamesSINGH, JASMINE Treating Provider/Extender: Kathreen Cosieroulter, Sarya Linenberger Weeks in Treatment: 7 Diagnosis Coding ICD-10 Codes Code Description [phone] 9248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 of other part of left lower leg with fat layer exposed E11.622 Type 2 diabetes mellitus with other skin ulcer I89.0 Lymphedema, not elsewhere classified G70.00 Myasthenia gravis without (acute) exacerbation I87.2 Venous insufficiency (chronic) (peripheral) I73.9 Peripheral vascular disease, unspecified Facility Procedures CPT4 Code Description: [id number] 11042 - DEB SUBQ TISSUE 20 SQ CM/< ICD-10 Diagnosis Description 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 of other part of left lower leg with Modifier: fat layer expos Quantity: 1 ed Physician Procedures CPT4 Code  Description: [id number] 11042 - WC PHYS SUBQ TISS 20 SQ CM ICD-10 Diagnosis Description 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 of other part of left lower leg with Modifier: fat layer expos Quantity: 1 ed Electronic Signature(s) Signed: 10/29/2017 11:18:13 AM By: Bonnell Publicoulter, Remer Couse Entered By: Bonnell Publicoulter, Pepe Mineau on 10/29/2017 11:18:13

## 2017-11-08 NOTE — Progress Notes (Signed)
[name], Tayven L. ([id number]) Visit Report for 10/29/2017 Arrival Information Details Patient Name: [name], Douglas L. Date of Service: 10/29/2017 10:00 AM Medical Record Number: [id number] Patient Account Number: [id number] Date of Birth/Sex: 01/06/1951 (66 y.o. M) Treating RN: Curtis Sitesorthy, Joanna Primary Care Rashida Ladouceur: Leotis ShamesSINGH, JASMINE Other Clinician: Referring Khristine Verno: Leotis ShamesSINGH, JASMINE Treating Adiya Selmer/Extender: Kathreen Cosieroulter, Leah Weeks in Treatment: 7 Visit Information History Since Last Visit Added or deleted any medications: No Patient Arrived: Ambulatory Any new allergies or adverse reactions: No Arrival Time: 10:05 Had a fall or experienced change in No Accompanied By: self activities of daily living that may affect Transfer Assistance: None risk of falls: Patient Identification Verified: Yes Signs or symptoms of abuse/neglect since last visito No Secondary Verification Process Completed: Yes Hospitalized since last visit: No Patient Requires Transmission-Based No Implantable device outside of the clinic excluding No Precautions: cellular tissue based products placed in the center Patient Has Alerts: Yes since last visit: Has Dressing in Place as Prescribed: Yes Has Compression in Place as Prescribed: Yes Pain Present Now: No Electronic Signature(s) Signed: 10/29/2017 4:05:36 PM By: Curtis Sitesorthy, Joanna Entered By: Curtis Sitesorthy, Joanna on 10/29/2017 10:06:17 [name], Douglas L. ([id number]) -------------------------------------------------------------------------------- Encounter Discharge Information Details Patient Name: [name], Douglas L. Date of Service: 10/29/2017 10:00 AM Medical Record Number: [id number] Patient Account Number: [id number] Date of Birth/Sex: 01/06/1951 (66 y.o. M) Treating RN: Renne CriglerFlinchum, Cheryl Primary Care Reyansh Kushnir: Leotis ShamesSINGH, JASMINE Other Clinician: Referring Somara Frymire: Leotis ShamesSINGH, JASMINE Treating Jaylon Boylen/Extender: Kathreen Cosieroulter, Leah Weeks in Treatment: 7 Encounter Discharge  Information Items Discharge Condition: Stable Ambulatory Status: Ambulatory Discharge Destination: Home Transportation: Private Auto Schedule Follow-up Appointment: Yes Clinical Summary of Care: Electronic Signature(s) Signed: 10/29/2017 3:57:43 PM By: Renne CriglerFlinchum, Cheryl Entered By: Renne CriglerFlinchum, Cheryl on 10/29/2017 10:46:42 [name], Douglas L. ([id number]) -------------------------------------------------------------------------------- Lower Extremity Assessment Details Patient Name: [name], Douglas L. Date of Service: 10/29/2017 10:00 AM Medical Record Number: [id number] Patient Account Number: [id number] Date of Birth/Sex: 01/06/1951 (66 y.o. M) Treating RN: Curtis Sitesorthy, Joanna Primary Care Lavante Toso: Leotis ShamesSINGH, JASMINE Other Clinician: Referring Glena Pharris: Leotis ShamesSINGH, JASMINE Treating Bret Stamour/Extender: Kathreen Cosieroulter, Leah Weeks in Treatment: 7 Edema Assessment Assessed: [Left: No] [Right: No] [Left: Edema] [Right: :] Calf Left: Right: Point of Measurement: 35 cm From Medial Instep 40.5 cm cm Ankle Left: Right: Point of Measurement: 14 cm From Medial Instep 23.7 cm cm Vascular Assessment Pulses: Dorsalis Pedis Palpable: [Left:Yes] Posterior Tibial Extremity colors, hair growth, and conditions: Extremity Color: [Left:Hyperpigmented] Hair Growth on Extremity: [Left:No] Temperature of Extremity: [Left:Warm] Capillary Refill: [Left:< 3 seconds] Toe Nail Assessment Left: Right: Thick: Yes Discolored: Yes Deformed: No Improper Length and Hygiene: No Electronic Signature(s) Signed: 10/29/2017 4:05:36 PM By: Curtis Sitesorthy, Joanna Entered By: Curtis Sitesorthy, Joanna on 10/29/2017 10:13:19 [name], Douglas L. ([id number]) -------------------------------------------------------------------------------- Multi Wound Chart Details Patient Name: [name], [name] Date of Service: 10/29/2017 10:00 AM Medical Record Number: [id number] Patient Account Number: [id number] Date of Birth/Sex: 01/06/1951 (66 y.o. M) Treating RN:  Phillis HaggisPinkerton, Debi Primary Care Dotti Busey: Leotis ShamesSINGH, JASMINE Other Clinician: Referring Nixon Sparr: Leotis ShamesSINGH, JASMINE Treating Jackey Housey/Extender: Kathreen Cosieroulter, Leah Weeks in Treatment: 7 Vital Signs Height(in): 72 Pulse(bpm): 61 Weight(lbs): 323.9 Blood Pressure(mmHg): 153/72 Body Mass Index(BMI): 44 Temperature(F): 97.9 Respiratory Rate 16 (breaths/min): Photos: [1:No Photos] [N/A:N/A] Wound Location: [1:Left Lower Leg - Lateral] [N/A:N/A] Wounding Event: [1:Gradually Appeared] [N/A:N/A] Primary Etiology: [1:Diabetic Wound/Ulcer of the Lower Extremity] [N/A:N/A] Comorbid History: [1:Cataracts, Anemia, Lymphedema, Hypertension, Type II Diabetes] [N/A:N/A] Date Acquired: [1:08/26/2017] [N/A:N/A] Weeks of Treatment: [1:7] [N/A:N/A] Wound Status: [1:Open] [N/A:N/A] Measurements L x W x D [1:1.4x1x0.2] [N/A:N/A] (cm) Area (cm) : [1:1.1] [N/A:N/A] Volume (cm) : [  1:0.22] [N/A:N/A] % Reduction in Area: [1:64.90%] [N/A:N/A] % Reduction in Volume: [1:29.70%] [N/A:N/A] Classification: [1:Grade 1] [N/A:N/A] Exudate Amount: [1:Small] [N/A:N/A] Exudate Type: [1:Serosanguineous] [N/A:N/A] Exudate Color: [1:red, brown] [N/A:N/A] Wound Margin: [1:Flat and Intact] [N/A:N/A] Granulation Amount: [1:Large (67-100%)] [N/A:N/A] Granulation Quality: [1:Red, Pink] [N/A:N/A] Necrotic Amount: [1:Small (1-33%)] [N/A:N/A] Exposed Structures: [1:Fat Layer (Subcutaneous Tissue) Exposed: Yes Fascia: No Tendon: No Muscle: No Joint: No Bone: No] [N/A:N/A] Epithelialization: [1:Small (1-33%)] [N/A:N/A] Debridement: [1:Debridement - Excisional] [N/A:N/A] Pre-procedure [1:10:25] [N/A:N/A] Verification/Time Out Taken: Pain Control: [1:Lidocaine 4% Topical Solution] [N/A:N/A] Tissue Debrided: Subcutaneous, Slough N/A N/A Level: Skin/Subcutaneous Tissue N/A N/A Debridement Area (sq cm): 1.4 N/A N/A Instrument: Curette N/A N/A Bleeding: Minimum N/A N/A Hemostasis Achieved: Pressure N/A N/A Procedural Pain: 0 N/A  N/A Post Procedural Pain: 0 N/A N/A Debridement Treatment Procedure was tolerated well N/A N/A Response: Post Debridement 1.4x1x0.3 N/A N/A Measurements L x W x D (cm) Post Debridement Volume: 0.33 N/A N/A (cm) Periwound Skin Texture: Excoriation: No N/A N/A Induration: No Callus: No Crepitus: No Rash: No Scarring: No Periwound Skin Moisture: Maceration: No N/A N/A Dry/Scaly: No Periwound Skin Color: Erythema: Yes N/A N/A Atrophie Blanche: No Cyanosis: No Ecchymosis: No Hemosiderin Staining: No Mottled: No Pallor: No Rubor: No Erythema Location: Circumferential N/A N/A Temperature: No Abnormality N/A N/A Tenderness on Palpation: Yes N/A N/A Wound Preparation: Ulcer Cleansing: N/A N/A Rinsed/Irrigated with Saline, Other: soap and water Topical Anesthetic Applied: Other: lidocaine 4% Procedures Performed: Debridement N/A N/A Treatment Notes Electronic Signature(s) Signed: 10/29/2017 10:36:31 AM By: Bonnell Public Entered By: Bonnell Public on 10/29/2017 10:36:30 Douglas Loll ([id number]) -------------------------------------------------------------------------------- Multi-Disciplinary Care Plan Details Patient Name: [name], Douglas L. Date of Service: 10/29/2017 10:00 AM Medical Record Number: [id number] Patient Account Number: [id number] Date of Birth/Sex: May 20, 1950 (66 y.o. M) Treating RN: Phillis Haggis Primary Care Dovie Kapusta: Leotis Shames Other Clinician: Referring Julion Gatt: Leotis Shames Treating Carle Fenech/Extender: Kathreen Cosier in Treatment: 7 Active Inactive ` Orientation to the Wound Care Program Nursing Diagnoses: Knowledge deficit related to the wound healing center program Goals: Patient/caregiver will verbalize understanding of the Wound Healing Center Program Date Initiated: 09/09/2017 Target Resolution Date: 09/30/2017 Goal Status: Active Interventions: Provide education on orientation to the wound center Notes: ` Wound/Skin  Impairment Nursing Diagnoses: Impaired tissue integrity Goals: Patient/caregiver will verbalize understanding of skin care regimen Date Initiated: 09/09/2017 Target Resolution Date: 09/28/2017 Goal Status: Active Ulcer/skin breakdown will have a volume reduction of 30% by week 4 Date Initiated: 09/09/2017 Target Resolution Date: 09/28/2017 Goal Status: Active Interventions: Assess patient/caregiver ability to obtain necessary supplies Assess patient/caregiver ability to perform ulcer/skin care regimen upon admission and as needed Assess ulceration(s) every visit Treatment Activities: Skin care regimen initiated : 09/09/2017 Notes: Electronic Signature(s) Signed: 10/29/2017 3:52:40 PM By: Sharlotte Alamo, [name] ([id number]) Entered By: Alejandro Mulling on 10/29/2017 10:24:55 Douglas Loll ([id number]) -------------------------------------------------------------------------------- Pain Assessment Details Patient Name: [name], Douglas L. Date of Service: 10/29/2017 10:00 AM Medical Record Number: [id number] Patient Account Number: [id number] Date of Birth/Sex: Aug 09, 1950 (66 y.o. M) Treating RN: Curtis Sites Primary Care Elizibeth Breau: Leotis Shames Other Clinician: Referring Kenslei Hearty: Leotis Shames Treating Klark Vanderhoef/Extender: Kathreen Cosier in Treatment: 7 Active Problems Location of Pain Severity and Description of Pain Patient Has Paino No Site Locations Pain Management and Medication Current Pain Management: Electronic Signature(s) Signed: 10/29/2017 4:05:36 PM By: Curtis Sites Entered By: Curtis Sites on 10/29/2017 10:06:23 Douglas Loll ([id number]) -------------------------------------------------------------------------------- Patient/Caregiver Education Details Patient Name: [name], HOCHMUTH L. Date of Service: 10/29/2017 10:00 AM Medical  Record Number: [id number] Patient Account Number: [id number] Date of Birth/Gender: 10-Sep-1950 (66 y.o.  M) Treating RN: Renne CriglerFlinchum, Cheryl Primary Care Physician: Leotis ShamesSINGH, JASMINE Other Clinician: Referring Physician: Leotis ShamesSINGH, JASMINE Treating Physician/Extender: Kathreen Cosieroulter, Leah Weeks in Treatment: 7 Education Assessment Education Provided To: Patient Education Topics Provided Wound Debridement: Handouts: Wound Debridement Methods: Explain/Verbal Responses: State content correctly Wound/Skin Impairment: Methods: Explain/Verbal Responses: State content correctly Electronic Signature(s) Signed: 10/29/2017 3:57:43 PM By: Renne CriglerFlinchum, Cheryl Entered By: Renne CriglerFlinchum, Cheryl on 10/29/2017 10:46:58 Erkkila, Ferdinand LangoICHARD L. ([id number]) -------------------------------------------------------------------------------- Wound Assessment Details Patient Name: [name], Mahari L. Date of Service: 10/29/2017 10:00 AM Medical Record Number: [id number] Patient Account Number: [id number] Date of Birth/Sex: 10-Sep-1950 (66 y.o. M) Treating RN: Curtis Sitesorthy, Joanna Primary Care Montie Swiderski: Leotis ShamesSINGH, JASMINE Other Clinician: Referring Lorelei Heikkila: Leotis ShamesSINGH, JASMINE Treating Nyilah Kight/Extender: Kathreen Cosieroulter, Leah Weeks in Treatment: 7 Wound Status Wound Number: 1 Primary Diabetic Wound/Ulcer of the Lower Extremity Etiology: Wound Location: Left Lower Leg - Lateral Wound Status: Open Wounding Event: Gradually Appeared Comorbid Cataracts, Anemia, Lymphedema, Date Acquired: 08/26/2017 History: Hypertension, Type II Diabetes Weeks Of Treatment: 7 Clustered Wound: No Photos Photo Uploaded By: Curtis Sitesorthy, Joanna on 10/29/2017 12:22:00 Wound Measurements Length: (cm) 1.4 % Redu Width: (cm) 1 % Redu Depth: (cm) 0.2 Epithe Area: (cm) 1.1 Tunne Volume: (cm) 0.22 Under ction in Area: 64.9% ction in Volume: 29.7% lialization: Small (1-33%) ling: No mining: No Wound Description Classification: Grade 1 Wound Margin: Flat and Intact Exudate Amount: Small Exudate Type: Serosanguineous Exudate Color: red, brown Foul Odor After Cleansing:  No Slough/Fibrino Yes Wound Bed Granulation Amount: Large (67-100%) Exposed Structure Granulation Quality: Red, Pink Fascia Exposed: No Necrotic Amount: Small (1-33%) Fat Layer (Subcutaneous Tissue) Exposed: Yes Necrotic Quality: Adherent Slough Tendon Exposed: No Muscle Exposed: No Joint Exposed: No Bone Exposed: No Periwound Skin Texture Gillian, [place] L. ([id number]) Texture Color No Abnormalities Noted: No No Abnormalities Noted: No Callus: No Atrophie Blanche: No Crepitus: No Cyanosis: No Excoriation: No Ecchymosis: No Induration: No Erythema: Yes Rash: No Erythema Location: Circumferential Scarring: No Hemosiderin Staining: No Mottled: No Moisture Pallor: No No Abnormalities Noted: No Rubor: No Dry / Scaly: No Maceration: No Temperature / Pain Temperature: No Abnormality Tenderness on Palpation: Yes Wound Preparation Ulcer Cleansing: Rinsed/Irrigated with Saline, Other: soap and water, Topical Anesthetic Applied: Other: lidocaine 4%, Treatment Notes Wound #1 (Left, Lateral Lower Leg) 1. Cleansed with: Clean wound with Normal Saline 2. Anesthetic Topical Lidocaine 4% cream to wound bed prior to debridement 3. Peri-wound Care: Moisturizing lotion 4. Dressing Applied: Prisma Ag 5. Secondary Dressing Applied ABD Pad Non-Adherent pad 7. Secured with 3 Layer Compression System - Left Lower Extremity Notes , unna to anchor; Electronic Signature(s) Signed: 10/29/2017 4:05:36 PM By: Curtis Sitesorthy, Joanna Entered By: Curtis Sitesorthy, Joanna on 10/29/2017 10:17:54 Amini, Ferdinand LangoICHARD L. ([id number]) -------------------------------------------------------------------------------- Vitals Details Patient Name: [name], Jerardo L. Date of Service: 10/29/2017 10:00 AM Medical Record Number: [id number] Patient Account Number: [id number] Date of Birth/Sex: 10-Sep-1950 (66 y.o. M) Treating RN: Curtis Sitesorthy, Joanna Primary Care Maximilian Tallo: Leotis ShamesSINGH, JASMINE Other Clinician: Referring Kyrian Stage:  Leotis ShamesSINGH, JASMINE Treating Deslyn Cavenaugh/Extender: Kathreen Cosieroulter, Leah Weeks in Treatment: 7 Vital Signs Time Taken: 10:07 Temperature (F): 97.9 Height (in): 72 Pulse (bpm): 61 Weight (lbs): 323.9 Respiratory Rate (breaths/min): 16 Body Mass Index (BMI): 43.9 Blood Pressure (mmHg): 153/72 Reference Range: 80 - 120 mg / dl Electronic Signature(s) Signed: 10/29/2017 4:05:36 PM By: Curtis Sitesorthy, Joanna Entered By: Curtis Sitesorthy, Joanna on 10/29/2017 10:09:51

## 2017-11-12 ENCOUNTER — Encounter: Payer: BLUE CROSS/BLUE SHIELD | Admitting: Physician Assistant

## 2017-11-12 DIAGNOSIS — E11622 Type 2 diabetes mellitus with other skin ulcer: Secondary | ICD-10-CM | POA: Diagnosis not present

## 2017-11-15 NOTE — Progress Notes (Signed)
AIVEN, KAMPE ([id number]) Visit Report for 11/04/2017 Arrival Information Details Patient Name: [name], [name]. Date of Service: 11/04/2017 11:00 AM Medical Record Number: [id number] Patient Account Number: [id number] Date of Birth/Sex: 1950-12-28 (66 y.o. M) Treating RN: Phillis Haggis Primary Care Aleta Manternach: Leotis Shames Other Clinician: Referring Kyiesha Millward: Leotis Shames Treating Leonid Manus/Extender: STONE III, HOYT Weeks in Treatment: 8 Visit Information History Since Last Visit All ordered tests and consults were completed: No Patient Arrived: Ambulatory Added or deleted any medications: No Arrival Time: 11:09 Any new allergies or adverse reactions: No Accompanied By: self Had a fall or experienced change in No Transfer Assistance: None activities of daily living that may affect Patient Identification Verified: Yes risk of falls: Secondary Verification Process Completed: Yes Signs or symptoms of abuse/neglect since last visito No Patient Requires Transmission-Based No Hospitalized since last visit: No Precautions: Implantable device outside of the clinic excluding No Patient Has Alerts: Yes cellular tissue based products placed in the center since last visit: Has Dressing in Place as Prescribed: Yes Has Compression in Place as Prescribed: Yes Pain Present Now: No Electronic Signature(s) Signed: 11/04/2017 11:34:14 AM By: Alejandro Mulling Entered By: Alejandro Mulling on 11/04/2017 11:11:49 [name] ([id number]) -------------------------------------------------------------------------------- Encounter Discharge Information Details Patient Name: [name], Douglas L. Date of Service: 11/04/2017 11:00 AM Medical Record Number: [id number] Patient Account Number: [id number] Date of Birth/Sex: 1950/10/07 (66 y.o. M) Treating RN: Phillis Haggis Primary Care Sammi Stolarz: Leotis Shames Other Clinician: Referring Demitrios Molyneux: Leotis Shames Treating Jakeya Gherardi/Extender:  Linwood Dibbles, HOYT Weeks in Treatment: 8 Encounter Discharge Information Items Discharge Condition: Stable Ambulatory Status: Ambulatory Discharge Destination: Home Transportation: Private Auto Accompanied By: self Schedule Follow-up Appointment: Yes Clinical Summary of Care: Electronic Signature(s) Signed: 11/04/2017 11:34:14 AM By: Alejandro Mulling Entered By: Alejandro Mulling on 11/04/2017 11:25:45 [name] ([id number]) -------------------------------------------------------------------------------- Patient/Caregiver Education Details Patient Name: [name], MAFFEO. Date of Service: 11/04/2017 11:00 AM Medical Record Number: [id number] Patient Account Number: [id number] Date of Birth/Gender: May 05, 1950 (66 y.o. M) Treating RN: Phillis Haggis Primary Care Physician: Leotis Shames Other Clinician: Referring Physician: Leotis Shames Treating Physician/Extender: Linwood Dibbles, HOYT Weeks in Treatment: 8 Education Assessment Education Provided To: Patient Education Topics Provided Wound/Skin Impairment: Handouts: Caring for Your Ulcer, Skin Care Do's and Dont's, Other: change dressing as ordered Methods: Demonstration, Explain/Verbal Responses: State content correctly Electronic Signature(s) Signed: 11/04/2017 11:34:14 AM By: Alejandro Mulling Entered By: Alejandro Mulling on 11/04/2017 11:25:35 Gann, [name] ([id number]) -------------------------------------------------------------------------------- Wound Assessment Details Patient Name: [name], Douglas L. Date of Service: 11/04/2017 11:00 AM Medical Record Number: [id number] Patient Account Number: [id number] Date of Birth/Sex: [birth year] (66 y.o. M) Treating RN: Phillis Haggis Primary Care Tyauna Lacaze: Leotis Shames Other Clinician: Referring Kiyani Jernigan: Leotis Shames Treating Lular Letson/Extender: STONE III, HOYT Weeks in Treatment: 8 Wound Status Wound Number: 1 Primary Diabetic Wound/Ulcer of the Lower  Extremity Etiology: Wound Location: Left Lower Leg - Lateral Wound Open Wounding Event: Gradually Appeared Status: Date Acquired: 08/26/2017 Comorbid Cataracts, Anemia, Lymphedema, Weeks Of Treatment: 8 History: Hypertension, Type II Diabetes Clustered Wound: No Photos Photo Uploaded By: Alejandro Mulling on 11/04/2017 11:34:02 Wound Measurements Length: (cm) 1.4 % Reduct Width: (cm) 1 % Reduct Depth: (cm) 0.2 Epitheli Area: (cm) 1.1 Tunneli Volume: (cm) 0.22 Undermi ion in Area: 64.9% ion in Volume: 29.7% alization: Small (1-33%) ng: No ning: No Wound Description Classification: Grade 1 Wound Margin: Flat and Intact Exudate Amount: Medium Exudate Type: Serosanguineous Exudate Color: red, brown Foul Odor After Cleansing: No Slough/Fibrino Yes Wound Bed Granulation Amount: Medium (34-66%) Exposed  Structure Granulation Quality: Red, Pink Fascia Exposed: No Necrotic Amount: Medium (34-66%) Fat Layer (Subcutaneous Tissue) Exposed: Yes Necrotic Quality: Adherent Slough Tendon Exposed: No Muscle Exposed: No Joint Exposed: No Bone Exposed: No Periwound Skin Texture [name], Douglas L. ([id number]) Texture Color No Abnormalities Noted: No No Abnormalities Noted: No Callus: No Atrophie Blanche: No Crepitus: No Cyanosis: No Excoriation: No Ecchymosis: No Induration: No Erythema: Yes Rash: No Erythema Location: Circumferential Scarring: No Hemosiderin Staining: No Mottled: No Moisture Pallor: No No Abnormalities Noted: No Rubor: No Dry / Scaly: No Maceration: No Temperature / Pain Temperature: No Abnormality Tenderness on Palpation: Yes Wound Preparation Ulcer Cleansing: Rinsed/Irrigated with Saline, Other: soap and water, Topical Anesthetic Applied: None Treatment Notes Wound #1 (Left, Lateral Lower Leg) 1. Cleansed with: Clean wound with Normal Saline Cleanse wound with antibacterial soap and water 3. Peri-wound Care: Moisturizing lotion 4.  Dressing Applied: Prisma Ag 5. Secondary Dressing Applied ABD Pad Non-Adherent pad 7. Secured with Tape 3 Layer Compression System - Left Lower Extremity Notes unna to anchor Electronic Signature(s) Signed: 11/04/2017 11:34:14 AM By: Alejandro MullingPinkerton, [name] Entered By: Alejandro MullingPinkerton, [name] on 11/04/2017 11:24:33

## 2017-11-19 ENCOUNTER — Encounter: Payer: BLUE CROSS/BLUE SHIELD | Admitting: Physician Assistant

## 2017-11-19 DIAGNOSIS — E11622 Type 2 diabetes mellitus with other skin ulcer: Secondary | ICD-10-CM | POA: Diagnosis not present

## 2017-11-22 NOTE — Progress Notes (Signed)
AHYAN, KREEGER ([id number]) Visit Report for 11/12/2017 Arrival Information Details Patient Name: Douglas, Wright. Date of Service: 11/12/2017 10:00 AM Medical Record Number: [id number] Patient Account Number: [id number] Date of Birth/Sex: [birth year] (66 y.o. M) Treating RN: Huel Coventry Primary Care Usama Harkless: Leotis Shames Other Clinician: Referring Kharma Sampsel: Leotis Shames Treating Tequan Redmon/Extender: Linwood Dibbles, HOYT Weeks in Treatment: 9 Visit Information History Since Last Visit Added or deleted any medications: No Patient Arrived: Ambulatory Any new allergies or adverse reactions: No Arrival Time: 10:29 Had a fall or experienced change in No Accompanied By: self activities of daily living that may affect Transfer Assistance: None risk of falls: Patient Identification Verified: Yes Signs or symptoms of abuse/neglect since last visito No Secondary Verification Process Completed: Yes Hospitalized since last visit: No Patient Requires Transmission-Based No Implantable device outside of the clinic excluding No Precautions: cellular tissue based products placed in the center Patient Has Alerts: Yes since last visit: Has Dressing in Place as Prescribed: Yes Pain Present Now: No Electronic Signature(s) Signed: 11/12/2017 4:55:57 PM By: Elliot Gurney, BSN, RN, CWS, Kim RN, BSN Entered By: Elliot Gurney, BSN, RN, CWS, Kim on 11/12/2017 10:29:30 Douglas Loll ([id number]) -------------------------------------------------------------------------------- Encounter Discharge Information Details Patient Name: [name], [name]. Date of Service: 11/12/2017 10:00 AM Medical Record Number: [id number] Patient Account Number: [id number] Date of Birth/Sex: 1951/02/13 (66 y.o. M) Treating RN: Renne Crigler Primary Care Renai Lopata: Leotis Shames Other Clinician: Referring Hila Bolding: Leotis Shames Treating Deboraha Goar/Extender: Linwood Dibbles, HOYT Weeks in Treatment: 9 Encounter Discharge Information  Items Discharge Condition: Stable Ambulatory Status: Ambulatory Discharge Destination: Home Transportation: Private Auto Schedule Follow-up Appointment: Yes Clinical Summary of Care: Electronic Signature(s) Signed: 11/12/2017 4:53:55 PM By: Renne Crigler Entered By: Renne Crigler on 11/12/2017 11:10:42 Lainez, [name] ([id number]) -------------------------------------------------------------------------------- Lower Extremity Assessment Details Patient Name: Douglas, SABER Wright. Date of Service: 11/12/2017 10:00 AM Medical Record Number: [id number] Patient Account Number: [id number] Date of Birth/Sex: [birth year] (66 y.o. M) Treating RN: Huel Coventry Primary Care Kynnedi Zweig: Leotis Shames Other Clinician: Referring Phillp Dolores: Leotis Shames Treating Gayl Ivanoff/Extender: STONE III, HOYT Weeks in Treatment: 9 Edema Assessment Assessed: [Left: No] [Right: No] [Left: Edema] [Right: :] Calf Left: Right: Point of Measurement: 35 cm From Medial Instep 41.8 cm cm Ankle Left: Right: Point of Measurement: 14 cm From Medial Instep 22.6 cm cm Vascular Assessment Claudication: Claudication Assessment [Left:None] Pulses: Dorsalis Pedis Palpable: [Left:Yes] Posterior Tibial Extremity colors, hair growth, and conditions: Extremity Color: [Left:Normal] Hair Growth on Extremity: [Left:Yes] Temperature of Extremity: [Left:Warm] Capillary Refill: [Left:< 3 seconds] Toe Nail Assessment Left: Right: Thick: No Discolored: No Deformed: No Improper Length and Hygiene: No Electronic Signature(s) Signed: 11/12/2017 4:55:57 PM By: Elliot Gurney, BSN, RN, CWS, Kim RN, BSN Entered By: Elliot Gurney, BSN, RN, CWS, Kim on 11/12/2017 10:39:05 Douglas Loll ([id number]) -------------------------------------------------------------------------------- Multi Wound Chart Details Patient Name: Douglas, REGGIO Wright. Date of Service: 11/12/2017 10:00 AM Medical Record Number: [id number] Patient Account Number:  [id number] Date of Birth/Sex: 1950/05/29 (66 y.o. M) Treating RN: Phillis Haggis Primary Care Qasim Diveley: Leotis Shames Other Clinician: Referring Lachae Hohler: Leotis Shames Treating Furqan Gosselin/Extender: STONE III, HOYT Weeks in Treatment: 9 Vital Signs Height(in): 72 Pulse(bpm): 57 Weight(lbs): 323.9 Blood Pressure(mmHg): 140/59 Body Mass Index(BMI): 44 Temperature(F): 98 Respiratory Rate 16 (breaths/min): Photos: [1:No Photos] [N/A:N/A] Wound Location: [1:Left, Lateral Lower Leg] [N/A:N/A] Wounding Event: [1:Gradually Appeared] [N/A:N/A] Primary Etiology: [1:Diabetic Wound/Ulcer of the Lower Extremity] [N/A:N/A] Comorbid History: [1:Cataracts, Anemia, Lymphedema, Hypertension, Type II Diabetes] [N/A:N/A] Date Acquired: [1:08/26/2017] [N/A:N/A] Weeks of Treatment: [1:9] [N/A:N/A] Wound Status: [1:Open] [N/A:N/A] Measurements  [name] [1:1.1x0.9x0.2] [N/A:N/A] (cm) Area (cm) : [1:0.778] [N/A:N/A] Volume (cm) : [1:0.156] [N/A:N/A] % Reduction in Area: [1:75.20%] [N/A:N/A] % Reduction in Volume: [1:50.20%] [N/A:N/A] Classification: [1:Grade 1] [N/A:N/A] Exudate Amount: [1:Medium] [N/A:N/A] Exudate Type: [1:Serosanguineous] [N/A:N/A] Exudate Color: [1:red, brown] [N/A:N/A] Wound Margin: [1:Flat and Intact] [N/A:N/A] Granulation Amount: [1:Large (67-100%)] [N/A:N/A] Granulation Quality: [1:Red, Pink] [N/A:N/A] Necrotic Amount: [1:Small (1-33%)] [N/A:N/A] Exposed Structures: [1:Fat Layer (Subcutaneous Tissue) Exposed: Yes Fascia: No Tendon: No Muscle: No Joint: No Bone: No] [N/A:N/A] Epithelialization: [1:Small (1-33%)] [N/A:N/A] Periwound Skin Texture: [1:Excoriation: No Induration: No Callus: No Crepitus: No] [N/A:N/A] Rash: No Scarring: No Periwound Skin Moisture: Maceration: No N/A N/A Dry/Scaly: No Periwound Skin Color: Erythema: Yes N/A N/A Atrophie Blanche: No Cyanosis: No Ecchymosis: No Hemosiderin Staining: No Mottled: No Pallor: No Rubor: No Erythema  Location: Circumferential N/A N/A Temperature: No Abnormality N/A N/A Tenderness on Palpation: Yes N/A N/A Wound Preparation: Ulcer Cleansing: N/A N/A Rinsed/Irrigated with Saline, Other: soap and water Topical Anesthetic Applied: None Treatment Notes Electronic Signature(s) Signed: 11/13/2017 4:43:18 PM By: Alejandro Mulling Entered By: Alejandro Mulling on 11/12/2017 10:54:23 Douglas Loll ([id number]) -------------------------------------------------------------------------------- Multi-Disciplinary Care Plan Details Patient Name: Douglas, ULLMAN Wright. Date of Service: 11/12/2017 10:00 AM Medical Record Number: [id number] Patient Account Number: [id number] Date of Birth/Sex: 1950/05/02 (66 y.o. M) Treating RN: Phillis Haggis Primary Care Malcom Selmer: Leotis Shames Other Clinician: Referring Ceci Taliaferro: Leotis Shames Treating Mahlia Fernando/Extender: STONE III, HOYT Weeks in Treatment: 9 Active Inactive ` Orientation to the Wound Care Program Nursing Diagnoses: Knowledge deficit related to the wound healing center program Goals: Patient/caregiver will verbalize understanding of the Wound Healing Center Program Date Initiated: 09/09/2017 Target Resolution Date: 09/30/2017 Goal Status: Active Interventions: Provide education on orientation to the wound center Notes: ` Wound/Skin Impairment Nursing Diagnoses: Impaired tissue integrity Goals: Patient/caregiver will verbalize understanding of skin care regimen Date Initiated: 09/09/2017 Target Resolution Date: 09/28/2017 Goal Status: Active Ulcer/skin breakdown will have a volume reduction of 30% by week 4 Date Initiated: 09/09/2017 Target Resolution Date: 09/28/2017 Goal Status: Active Interventions: Assess patient/caregiver ability to obtain necessary supplies Assess patient/caregiver ability to perform ulcer/skin care regimen upon admission and as needed Assess ulceration(s) every visit Treatment Activities: Skin care regimen  initiated : 09/09/2017 Notes: Electronic Signature(s) Signed: 11/13/2017 4:43:18 PM By: Sharlotte Alamo, [name] ([id number]) Entered By: Alejandro Mulling on 11/12/2017 10:54:08 Douglas Loll ([id number]) -------------------------------------------------------------------------------- Pain Assessment Details Patient Name: Douglas, CABELLO Wright. Date of Service: 11/12/2017 10:00 AM Medical Record Number: [id number] Patient Account Number: [id number] Date of Birth/Sex: 1950-12-28 (66 y.o. M) Treating RN: Huel Coventry Primary Care Mikeal Winstanley: Leotis Shames Other Clinician: Referring Adilynn Bessey: Leotis Shames Treating Almando Brawley/Extender: STONE III, HOYT Weeks in Treatment: 9 Active Problems Location of Pain Severity and Description of Pain Patient Has Paino No Site Locations With Dressing Change: No Pain Management and Medication Current Pain Management: Electronic Signature(s) Signed: 11/12/2017 4:55:57 PM By: Elliot Gurney, BSN, RN, CWS, Kim RN, BSN Entered By: Elliot Gurney, BSN, RN, CWS, Kim on 11/12/2017 10:29:37 Douglas Loll ([id number]) -------------------------------------------------------------------------------- Patient/Caregiver Education Details Patient Name: Douglas, LINDELL. Date of Service: 11/12/2017 10:00 AM Medical Record Number: [id number] Patient Account Number: [id number] Date of Birth/Gender: 09-09-1950 (66 y.o. M) Treating RN: Renne Crigler Primary Care Physician: Leotis Shames Other Clinician: Referring Physician: Leotis Shames Treating Physician/Extender: Skeet Simmer in Treatment: 9 Education Assessment Education Provided To: Patient Education Topics Provided Wound Debridement: Handouts: Wound Debridement Methods: Explain/Verbal Responses: State content correctly Wound/Skin Impairment: Handouts: Caring for Your Ulcer Methods:  Explain/Verbal Responses: State content correctly Electronic Signature(s) Signed: 11/12/2017 4:53:55 PM By:  Renne CriglerFlinchum, Cheryl Entered By: Renne CriglerFlinchum, Cheryl on 11/12/2017 11:10:56 Knebel, Douglas LangoICHARD Wright. ([id number]) -------------------------------------------------------------------------------- Wound Assessment Details Patient Name: [name], [name]. Date of Service: 11/12/2017 10:00 AM Medical Record Number: [id number] Patient Account Number: [id number] Date of Birth/Sex: 03-11-1951 (66 y.o. M) Treating RN: Huel CoventryWoody, Kim Primary Care Treyvin Glidden: Leotis ShamesSINGH, JASMINE Other Clinician: Referring Seena Face: Leotis ShamesSINGH, JASMINE Treating Srikar Chiang/Extender: STONE III, HOYT Weeks in Treatment: 9 Wound Status Wound Number: 1 Primary Diabetic Wound/Ulcer of the Lower Extremity Etiology: Wound Location: Left, Lateral Lower Leg Wound Status: Open Wounding Event: Gradually Appeared Comorbid Cataracts, Anemia, Lymphedema, Date Acquired: 08/26/2017 History: Hypertension, Type II Diabetes Weeks Of Treatment: 9 Clustered Wound: No Photos Photo Uploaded By: Elliot GurneyWoody, BSN, RN, CWS, Kim on 11/12/2017 11:33:49 Wound Measurements Length: (cm) 1.1 Width: (cm) 0.9 Depth: (cm) 0.2 Area: (cm) 0.778 Volume: (cm) 0.156 % Reduction in Area: 75.2% % Reduction in Volume: 50.2% Epithelialization: Small (1-33%) Tunneling: No Undermining: No Wound Description Classification: Grade 1 Foul Odor Wound Margin: Flat and Intact Slough/Fi Exudate Amount: Medium Exudate Type: Serosanguineous Exudate Color: red, brown After Cleansing: No brino Yes Wound Bed Granulation Amount: Large (67-100%) Exposed Structure Granulation Quality: Red, Pink Fascia Exposed: No Necrotic Amount: Small (1-33%) Fat Layer (Subcutaneous Tissue) Exposed: Yes Necrotic Quality: Adherent Slough Tendon Exposed: No Muscle Exposed: No Joint Exposed: No Bone Exposed: No Periwound Skin Texture Carcamo, Shrihaan Wright. ([id number]) Texture Color No Abnormalities Noted: No No Abnormalities Noted: No Callus: No Atrophie Blanche: No Crepitus: No Cyanosis:  No Excoriation: No Ecchymosis: No Induration: No Erythema: Yes Rash: No Erythema Location: Circumferential Scarring: No Hemosiderin Staining: No Mottled: No Moisture Pallor: No No Abnormalities Noted: No Rubor: No Dry / Scaly: No Maceration: No Temperature / Pain Temperature: No Abnormality Tenderness on Palpation: Yes Wound Preparation Ulcer Cleansing: Rinsed/Irrigated with Saline, Other: soap and water, Topical Anesthetic Applied: None Treatment Notes Wound #1 (Left, Lateral Lower Leg) 1. Cleansed with: Clean wound with Normal Saline 2. Anesthetic Topical Lidocaine 4% cream to wound bed prior to debridement 3. Peri-wound Care: Moisturizing lotion 4. Dressing Applied: Prisma Ag Non-Adherent gauze 5. Secondary Dressing Applied ABD Pad 7. Secured with 3 Layer Compression System - Left Lower Extremity Notes unna to Ecologistanchor Electronic Signature(s) Signed: 11/12/2017 4:55:57 PM By: Elliot GurneyWoody, BSN, RN, CWS, Kim RN, BSN Entered By: Elliot GurneyWoody, BSN, RN, CWS, Kim on 11/12/2017 10:41:13 [name], [name]. ([id number]) -------------------------------------------------------------------------------- Vitals Details Patient Name: [name], [name]. Date of Service: 11/12/2017 10:00 AM Medical Record Number: [id number] Patient Account Number: [id number] Date of Birth/Sex: 03-11-1951 (66 y.o. M) Treating RN: Huel CoventryWoody, Kim Primary Care Kanin Lia: Leotis ShamesSINGH, JASMINE Other Clinician: Referring Kevia Zaucha: Leotis ShamesSINGH, JASMINE Treating Sherrelle Prochazka/Extender: STONE III, HOYT Weeks in Treatment: 9 Vital Signs Time Taken: 10:32 Temperature (F): 98 Height (in): 72 Pulse (bpm): 57 Weight (lbs): 323.9 Respiratory Rate (breaths/min): 16 Body Mass Index (BMI): 43.9 Blood Pressure (mmHg): 140/59 Reference Range: 80 - 120 mg / dl Electronic Signature(s) Signed: 11/12/2017 4:55:57 PM By: Elliot GurneyWoody, BSN, RN, CWS, Kim RN, BSN Entered By: Elliot GurneyWoody, BSN, RN, CWS, Kim on 11/12/2017 10:32:35

## 2017-11-22 NOTE — Progress Notes (Signed)
[name] ([id number]) Visit Report for 11/12/2017 Chief Complaint Document Details Patient Name: [name], [name]. Date of Service: 11/12/2017 10:00 AM Medical Record Number: [id number] Patient Account Number: [id number] Date of Birth/Sex: 1950-05-26 (66 y.o. M) Treating RN: Phillis Haggis Primary Care Provider: Leotis Shames Other Clinician: Referring Provider: Leotis Shames Treating Provider/Extender: STONE III, Kenadi Miltner Weeks in Treatment: 9 Information Obtained from: Patient Chief Complaint Left leg ulcer Electronic Signature(s) Signed: 11/13/2017 12:11:53 PM By: Lenda Kelp PA-C Entered By: Lenda Kelp on 11/12/2017 10:52:46 [name] ([id number]) -------------------------------------------------------------------------------- Debridement Details Patient Name: [name], [name] L. Date of Service: 11/12/2017 10:00 AM Medical Record Number: [id number] Patient Account Number: [id number] Date of Birth/Sex: [birth year] (66 y.o. M) Treating RN: Phillis Haggis Primary Care Provider: Leotis Shames Other Clinician: Referring Provider: Leotis Shames Treating Provider/Extender: STONE III, Rhonda Vangieson Weeks in Treatment: 9 Debridement Performed for Wound #1 Left,Lateral Lower Leg Assessment: Performed By: Physician STONE III, Cherylynn Liszewski E., PA-C Debridement Type: Debridement Severity of Tissue Pre Fat layer exposed Debridement: Pre-procedure Verification/Time Yes - 10:55 Out Taken: Start Time: 10:55 Pain Control: Other : lidocaine 4% Total Area Debrided (L x W): 1.1 (cm) x 0.9 (cm) = 0.99 (cm) Tissue and other material Viable, Non-Viable, Slough, Subcutaneous, Slough debrided: Level: Skin/Subcutaneous Tissue Debridement Description: Excisional Instrument: Curette Bleeding: Minimum Hemostasis Achieved: Pressure End Time: 10:57 Procedural Pain: 0 Post Procedural Pain: 0 Response to Treatment: Procedure was tolerated well Level of Consciousness: Awake and  Alert Post Debridement Measurements of Total Wound Length: (cm) 1.1 Width: (cm) 0.9 Depth: (cm) 0.2 Volume: (cm) 0.156 Character of Wound/Ulcer Post Debridement: Stable Severity of Tissue Post Debridement: Fat layer exposed Post Procedure Diagnosis Same as Pre-procedure Electronic Signature(s) Signed: 11/13/2017 12:11:53 PM By: Lenda Kelp PA-C Signed: 11/13/2017 4:43:18 PM By: Alejandro Mulling Entered By: Alejandro Mulling on 11/12/2017 10:57:13 [name] ([id number]) -------------------------------------------------------------------------------- HPI Details Patient Name: [name], Douglas L. Date of Service: 11/12/2017 10:00 AM Medical Record Number: [id number] Patient Account Number: [id number] Date of Birth/Sex: [birth year] (66 y.o. M) Treating RN: Phillis Haggis Primary Care Provider: Leotis Shames Other Clinician: Referring Provider: Leotis Shames Treating Provider/Extender: STONE III, Lamari Youngers Weeks in Treatment: 9 History of Present Illness HPI Description: MYASTHENIA GRAVIS (MG) MEDICATION ALERT: Medications that increase weakness in most MG patients should be avoided or used only with great caution. These include: - Fluoroquinolone antibiotics: moxifloxacin (Aveloxo), ciprofloxacin (Ciproo, Proquin XRo), norfloxacin (Noroxino), levofloxacin (Levaquino), ofloxacin (Floxino), gemifloxacin (Factiveo) - Aminoglycoside antibiotics: tobramycin, gentamycin, kanamycin, neomycin, streptomycin - Macrolide antibiotics: erythromycin, azithromycin (Z-Pako, Zithromaxo), telithromycin (Keteko) - Botulinum toxins: Botoxo, Myobloco, Dysporto, Xeomino - Beta-blockers such as propanolol or timolol eyedrops (Timoptico) - Calcium channel blockers (blood pressure medications) - Quinine, quinidine or procainamide - D-penicillamine (do not confuse with penicillin) - Interferon - Magnesium salts: milk of magnesia, some antacids (Maaloxo, Mylantao) - Curare and related drugs (usually  used only during surgery) Other medications may produce problems in some MG patients. Be sure your doctor/dentist knows you have MG when any new medication is prescribed. Contact your myasthenia doctor if you or your local doctor has questions about medications. 09/09/17 on evaluation today patient actually appears for initial evaluation today concerning his left lateral lower extremity ulcer which has been present he tells me since November 2018. He states that gas logs that he'd forgotten he had said somewhere actually fell over landing on his leg causing the injury and unfortunately the left leg has never fully recovered since that time. He does note that there was some  improvement initially and now it's pretty much stagnant. He does have venous stasis chronically although he states it's been worse and pass compared to current. He has also been diagnosed with lymphedema although again this is something else that has been deemed the worst in the past compared to present. He does have lymphedema pumps although he really has not been using those on a regular basis. Patient does have diabetes mellitus type II in other than the venous insufficiency and prayerful vascular disease he also has myasthenia gravis board she is on medications long-term. Currently he has been tolerating the medications for this very well he does have a list as noted above in the HPI of medications that he is to avoid that we need to keep in mind. Nonetheless the patient seems to be doing fairly well in general. I do believe his peripheral edema may be contributing to the nonhealing factor in regard to this ulcer. 09/16/17 on evaluation today patient appears to be doing rather well his wound actually appears to show signs of improvement compared to previous evaluation which is great news. Overall his wound is a little larger but again I did debride the wound sharply last week so this is definitely expected as far as I was  concerned. Nonetheless the wound bed itself seems to be doing excellent. He is having no discomfort normally he does have a little bit of pain with cleansing of the wound by myself today. 09/23/17 on evaluation today patient actually appears to be showing signs of good improvement in regard to his left lateral motion the ulcer. He's been tolerating the dressing changes without complication. Fortunately there does not appear to be any evidence of infection at this point which is great news. Overall I'm extremely happy that he seems to be doing so well and the patient likewise wants to be healed as soon as possible he actually has a pull at home he would love to be in but he hasn't been able to get into this due to what's going on currently. 10/01/16 on evaluation today patient appears to be doing rather well in regard to his left lateral lower Trinity ulcer. He's been tolerating the dressing changes without complication. There does not appear to be any evidence of infection at this time. Overall I'm very pleased with the progress that has been made up to this point. I do believe after today will likely be able to switch to a different dressing. [name], [name] ([id number]) 10/08/17 on evaluation today patient actually appears to be doing very well at this point in time. His wound is showing signs of progress which is good news I am not even having to debride as much away as far as the slough is concerned. He does have some Slough which requires debridement at this point however. No fevers, chills, nausea, or vomiting noted at this time. 10/15/17 on evaluation today patient presents for follow-up concerning his ongoing lower extremity ulcer of the left lower extremity. He has been tolerating the dressing changes without complication. With that being said he tells me at this point that his wound has not really been causing much in the way of discomfort. The wound itself seems to be making good  progress with a good granular bed now at this point he did have some Slough on the surface of the wound this was minimal. He has good epithelialization from the anterior tourist posterior portion of the wound. 10/22/17 on evaluation today patient appears to be doing very  well in regard to his left lateral lower extremity ulcer. He is shown signs of improvement which is good news. He's been tolerating the dressing changes without complication. In general I think he has done well with the wraps and though he has not made as much progress this week is he has in weeks past I still feel he's making good progress. We will see how things appear in one weeks time. 10/29/17-He is here in follow-up evaluation for a left lower extremity ulcer. He is tolerating dressing changes and voicing no complaints or concerns. We will continue with the same treatment plan, he will return for nurse visit on Monday and follow-up in 2 weeks. 11/12/17 on evaluation today patient's left lateral lower Trinity ulcer appears to be doing very well at this point. He's been tolerating the dressing changes without complication. Fortunately his wounds have been making great progress and specifically the remaining wound has shown signs of new epithelialization and granulation week by week. Nonetheless he still does have a little bit of work to do as far as getting this to completely heal. No fevers, chills, nausea, or vomiting noted at this time. Electronic Signature(s) Signed: 11/13/2017 12:11:53 PM By: Lenda Kelp PA-C Entered By: Lenda Kelp on 11/13/2017 08:38:00 Vanessen, [name] ([id number]) -------------------------------------------------------------------------------- Physical Exam Details Patient Name: [name], [name]. Date of Service: 11/12/2017 10:00 AM Medical Record Number: [id number] Patient Account Number: [id number] Date of Birth/Sex: January 05, 1951 (66 y.o. M) Treating RN: Phillis Haggis Primary Care  Provider: Leotis Shames Other Clinician: Referring Provider: Leotis Shames Treating Provider/Extender: STONE III, Tequan Redmon Weeks in Treatment: 9 Constitutional Obese and well-hydrated in no acute distress. Respiratory normal breathing without difficulty. Psychiatric this patient is able to make decisions and demonstrates good insight into disease process. Alert and Oriented x 3. pleasant and cooperative. Notes Patient's wound bed did have minimal amounts of slough and biofilm noted on the surface of the wound which was carefully debrided away today the being careful not to damage and be the new epithelialization. He tolerated this without complication post debridement the wound bed appears to be doing much better. Electronic Signature(s) Signed: 11/13/2017 12:11:53 PM By: Lenda Kelp PA-C Entered By: Lenda Kelp on 11/13/2017 08:38:41 [name], [name] ([id number]) -------------------------------------------------------------------------------- Physician Orders Details Patient Name: [name], [name]. Date of Service: 11/12/2017 10:00 AM Medical Record Number: [id number] Patient Account Number: [id number] Date of Birth/Sex: 06-12-1950 (66 y.o. M) Treating RN: Phillis Haggis Primary Care Provider: Leotis Shames Other Clinician: Referring Provider: Leotis Shames Treating Provider/Extender: STONE III, Toree Edling Weeks in Treatment: 9 Verbal / Phone Orders: Yes Clinician: Ashok Cordia, Debi Read Back and Verified: Yes Diagnosis Coding ICD-10 Coding Code Description [phone] Non-pressure chronic ulcer of other part of left lower leg with fat layer exposed E11.622 Type 2 diabetes mellitus with other skin ulcer I89.0 Lymphedema, not elsewhere classified G70.00 Myasthenia gravis without (acute) exacerbation I87.2 Venous insufficiency (chronic) (peripheral) I73.9 Peripheral vascular disease, unspecified Wound Cleansing Wound #1 Left,Lateral Lower Leg o Clean wound with Normal  Saline. o Cleanse wound with mild soap and water Anesthetic (add to Medication List) Wound #1 Left,Lateral Lower Leg o Topical Lidocaine 4% cream applied to wound bed prior to debridement (In Clinic Only). Skin Barriers/Peri-Wound Care Wound #1 Left,Lateral Lower Leg o Moisturizing lotion Primary Wound Dressing Wound #1 Left,Lateral Lower Leg o Silver Collagen Secondary Dressing Wound #1 Left,Lateral Lower Leg o ABD pad o Non-adherent pad Dressing Change Frequency Wound #1 Left,Lateral Lower Leg o Change dressing every week  Follow-up Appointments Wound #1 Left,Lateral Lower Leg o Return Appointment in 1 week. o Nurse Visit as needed ULRICK, METHOT ([id number]) Edema Control Wound #1 Left,Lateral Lower Leg o 3 Layer Compression System - Left Lower Extremity - unna to anchor o Compression Pump: Use compression pump on left lower extremity for 30 minutes, twice daily. - work up to 1 hour twice daily o Compression Pump: Use compression pump on right lower extremity for 30 minutes, twice daily. - work up to 1 hour twice daily Additional Orders / Instructions Wound #1 Left,Lateral Lower Leg o Vitamin A; Vitamin C, Zinc Patient Medications Allergies: penicillin, Omnicef Notifications Medication Indication Start End lidocaine DOSE 1 - topical 4 % cream - 1 cream topical Electronic Signature(s) Signed: 11/13/2017 12:11:53 PM By: Lenda Kelp PA-C Signed: 11/13/2017 4:43:18 PM By: Alejandro Mulling Entered By: Alejandro Mulling on 11/12/2017 10:57:55 [name], [name] ([id number]) -------------------------------------------------------------------------------- Prescription 11/12/2017 Patient Name: [name]. Provider: Lenda Kelp PA-C Date of Birth: Feb 17, 1951 NPI#: [id number] Sex: Judie Petit DEA#: [id number] Phone #: [phone] License #: Patient Address: [facility] [address] [facility] [place], [place] [address] [address] [place], [place] [address] [phone] Allergies penicillin Omnicef Medication Medication: Route: Strength: Form: lidocaine topical 4% cream Class: TOPICAL LOCAL ANESTHETICS Dose: Frequency / Time: Indication: 1 1 cream topical Number of Refills: Number of Units: 0 Generic Substitution: Start Date: End Date: Administered at Substitution Permitted Facility: Yes Time Administered: Time Discontinued: Note to Pharmacy: Signature(s): Date(s): Electronic Signature(s) Signed: 11/13/2017 12:11:53 PM By: Lenda Kelp PA-C Signed: 11/13/2017 4:43:18 PM By: Sharlotte Alamo, [name] ([id number]) Entered By: Alejandro Mulling on 11/12/2017 10:57:55 [name] ([id number]) --------------------------------------------------------------------------------  Problem List Details Patient Name: DAIMION, ADAMCIK L. Date of Service: 11/12/2017 10:00 AM Medical Record Number: [id number] Patient Account Number: [id number] Date of Birth/Sex: 1950-04-19 (66 y.o. M) Treating RN: Phillis Haggis Primary Care Provider: Leotis Shames Other Clinician: Referring Provider: Leotis Shames Treating Provider/Extender: Linwood Dibbles, Martavius Lusty Weeks in Treatment: 9 Active Problems ICD-10 Evaluated Encounter Code Description Active Date Today Diagnosis 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 of other part of left lower leg with 09/09/2017 No Yes fat layer exposed E11.622 Type 2 diabetes mellitus with other skin ulcer 09/09/2017 No Yes I89.0 Lymphedema, not elsewhere classified 09/09/2017 No Yes G70.00 Myasthenia gravis without (acute) exacerbation 09/09/2017 No Yes I87.2 Venous insufficiency (chronic) (peripheral) 09/09/2017 No Yes I73.9 Peripheral vascular disease, unspecified 09/09/2017 No Yes Inactive Problems Resolved Problems Electronic Signature(s) Signed: 11/13/2017 12:11:53 PM By: Lenda Kelp PA-C Entered By: Lenda Kelp on  11/12/2017 10:52:23 [name] ([id number]) -------------------------------------------------------------------------------- Progress Note Details Patient Name: [name]. Date of Service: 11/12/2017 10:00 AM Medical Record Number: [id number] Patient Account Number: [id number] Date of Birth/Sex: [birth year] (66 y.o. M) Treating RN: Phillis Haggis Primary Care Provider: Leotis Shames Other Clinician: Referring Provider: Leotis Shames Treating Provider/Extender: STONE III, Chanse Kagel Weeks in Treatment: 9 Subjective Chief Complaint Information obtained from Patient Left leg ulcer History of Present Illness (HPI) MYASTHENIA GRAVIS (MG) MEDICATION ALERT: Medications that increase weakness in most MG patients should be avoided or used only with great caution. These include: - Fluoroquinolone antibiotics: moxifloxacin (Avelox), ciprofloxacin (Cipro, Proquin XR), norfloxacin (Noroxin), levofloxacin (Levaquin), ofloxacin (Floxin), gemifloxacin (Factive) - Aminoglycoside antibiotics: tobramycin, gentamycin, kanamycin, neomycin, streptomycin - Macrolide antibiotics: erythromycin, azithromycin (Z-Pak, Zithromax), telithromycin (Ketek) - Botulinum toxins: Botox, Myobloc, Dysport, Xeomin - Beta-blockers such as propanolol or timolol eyedrops (Timoptic) - Calcium channel  blockers (blood pressure medications) - Quinine, quinidine or procainamide - D-penicillamine (do not confuse with penicillin) - Interferon - Magnesium salts: milk of magnesia, some antacids (Maalox, Mylanta) - Curare and related drugs (usually used only during surgery) Other medications may produce problems in some MG patients. Be sure your doctor/dentist knows you have MG when any new medication is prescribed. Contact your myasthenia doctor if you or your local doctor has questions about medications. 09/09/17 on evaluation today patient actually appears for initial evaluation today  concerning his left lateral lower extremity ulcer which has been present he tells me since November 2018. He states that gas logs that he'd forgotten he had said somewhere actually fell over landing on his leg causing the injury and unfortunately the left leg has never fully recovered since that time. He does note that there was some improvement initially and now it's pretty much stagnant. He does have venous stasis chronically although he states it's been worse and pass compared to current. He has also been diagnosed with lymphedema although again this is something else that has been deemed the worst in the past compared to present. He does have lymphedema pumps although he really has not been using those on a regular basis. Patient does have diabetes mellitus type II in other than the venous insufficiency and prayerful vascular disease he also has myasthenia gravis board she is on medications long-term. Currently he has been tolerating the medications for this very well he does have a list as noted above in the HPI of medications that he is to avoid that we need to keep in mind. Nonetheless the patient seems to be doing fairly well in general. I do believe his peripheral edema may be contributing to the nonhealing factor in regard to this ulcer. 09/16/17 on evaluation today patient appears to be doing rather well his wound actually appears to show signs of improvement compared to previous evaluation which is great news. Overall his wound is a little larger but again I did debride the wound sharply last week so this is definitely expected as far as I was concerned. Nonetheless the wound bed itself seems to be doing excellent. He is having no discomfort normally he does have a little bit of pain with cleansing of the wound by myself today. 09/23/17 on evaluation today patient actually appears to be showing signs of good improvement in regard to his left lateral motion the ulcer. He's been tolerating  the dressing changes without complication. Fortunately there does not appear to be any evidence of infection at this point which is great news. Overall I'm extremely happy that he seems to be doing so well and the patient likewise wants to be healed as soon as possible he actually has a pull at home he would love to be in but he hasn't [name], Douglas L. ([id number]) been able to get into this due to what's going on currently. 10/01/16 on evaluation today patient appears to be doing rather well in regard to his left lateral lower Trinity ulcer. He's been tolerating the dressing changes without complication. There does not appear to be any evidence of infection at this time. Overall I'm very pleased with the progress that has been made up to this point. I do believe after today will likely be able to switch to a different dressing. 10/08/17 on evaluation today patient actually appears to be doing very well at this point in time. His wound is showing signs of progress which is good news I am  not even having to debride as much away as far as the slough is concerned. He does have some Slough which requires debridement at this point however. No fevers, chills, nausea, or vomiting noted at this time. 10/15/17 on evaluation today patient presents for follow-up concerning his ongoing lower extremity ulcer of the left lower extremity. He has been tolerating the dressing changes without complication. With that being said he tells me at this point that his wound has not really been causing much in the way of discomfort. The wound itself seems to be making good progress with a good granular bed now at this point he did have some Slough on the surface of the wound this was minimal. He has good epithelialization from the anterior tourist posterior portion of the wound. 10/22/17 on evaluation today patient appears to be doing very well in regard to his left lateral lower extremity ulcer. He is shown signs of  improvement which is good news. He's been tolerating the dressing changes without complication. In general I think he has done well with the wraps and though he has not made as much progress this week is he has in weeks past I still feel he's making good progress. We will see how things appear in one weeks time. 10/29/17-He is here in follow-up evaluation for a left lower extremity ulcer. He is tolerating dressing changes and voicing no complaints or concerns. We will continue with the same treatment plan, he will return for nurse visit on Monday and follow-up in 2 weeks. 11/12/17 on evaluation today patient's left lateral lower Trinity ulcer appears to be doing very well at this point. He's been tolerating the dressing changes without complication. Fortunately his wounds have been making great progress and specifically the remaining wound has shown signs of new epithelialization and granulation week by week. Nonetheless he still does have a little bit of work to do as far as getting this to completely heal. No fevers, chills, nausea, or vomiting noted at this time. Patient History Information obtained from Patient. Family History Cancer - Father, Heart Disease - Father, No family history of Diabetes, Hereditary Spherocytosis, Hypertension, Kidney Disease, Lung Disease, Seizures, Stroke, Thyroid Problems, Tuberculosis. Social History Never smoker, Marital Status - Married, Alcohol Use - Never, Drug Use - No History, Caffeine Use - Daily. Review of Systems (ROS) Constitutional Symptoms (General Health) Denies complaints or symptoms of Fever, Chills. Respiratory The patient has no complaints or symptoms. Cardiovascular Complains or has symptoms of LE edema. Psychiatric The patient has no complaints or symptoms. Douglas LollHUNTER, Douglas L. ([id number]) Objective Constitutional Obese and well-hydrated in no acute distress. Vitals Time Taken: 10:32 AM, Height: 72 in, Weight: 323.9 lbs, BMI: 43.9,  Temperature: 98 F, Pulse: 57 bpm, Respiratory Rate: 16 breaths/min, Blood Pressure: 140/59 mmHg. Respiratory normal breathing without difficulty. Psychiatric this patient is able to make decisions and demonstrates good insight into disease process. Alert and Oriented x 3. pleasant and cooperative. General Notes: Patient's wound bed did have minimal amounts of slough and biofilm noted on the surface of the wound which was carefully debrided away today the being careful not to damage and be the new epithelialization. He tolerated this without complication post debridement the wound bed appears to be doing much better. Integumentary (Hair, Skin) Wound #1 status is Open. Original cause of wound was Gradually Appeared. The wound is located on the Left,Lateral Lower Leg. The wound measures 1.1cm length x 0.9cm width x 0.2cm depth; 0.778cm^2 area and 0.156cm^3 volume. There is Fat  Layer (Subcutaneous Tissue) Exposed exposed. There is no tunneling or undermining noted. There is a medium amount of serosanguineous drainage noted. The wound margin is flat and intact. There is large (67-100%) red, pink granulation within the wound bed. There is a small (1-33%) amount of necrotic tissue within the wound bed including Adherent Slough. The periwound skin appearance exhibited: Erythema. The periwound skin appearance did not exhibit: Callus, Crepitus, Excoriation, Induration, Rash, Scarring, Dry/Scaly, Maceration, Atrophie Blanche, Cyanosis, Ecchymosis, Hemosiderin Staining, Mottled, Pallor, Rubor. The surrounding wound skin color is noted with erythema which is circumferential. Periwound temperature was noted as No Abnormality. The periwound has tenderness on palpation. 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 There was a Excisional Skin/Subcutaneous Tissue Legendre, Teancum L. ([id number]) Debridement with a total area of 0.99 sq cm performed by STONE III, Kitzia Camus E., PA-C. With the following instrument(s): Curette to remove Viable and Non-Viable tissue/material. Material removed includes Subcutaneous Tissue and Slough and after achieving pain control using Other (lidocaine 4%). A time out was conducted at 10:55, prior to the start of the procedure. A Minimum amount of bleeding was controlled with Pressure. The procedure was tolerated well with a pain level of 0 throughout and a pain level of 0 following the procedure. Patient s Level of Consciousness post procedure was recorded as Awake and Alert. Post Debridement Measurements: 1.1cm length x 0.9cm width x 0.2cm depth; 0.156cm^3 volume. Character of Wound/Ulcer Post Debridement is stable. Severity of Tissue Post Debridement is: Fat layer exposed. Post procedure Diagnosis Wound #1: Same as Pre-Procedure Plan Wound Cleansing: Wound #1 Left,Lateral Lower Leg: Clean wound with Normal Saline. Cleanse wound with mild soap and water Anesthetic (add to Medication List): Wound #1 Left,Lateral Lower Leg: Topical Lidocaine 4% cream applied to wound bed prior to debridement (In Clinic Only). Skin Barriers/Peri-Wound Care: Wound #1 Left,Lateral Lower Leg: Moisturizing lotion Primary Wound Dressing: Wound #1 Left,Lateral Lower Leg: Silver Collagen Secondary Dressing: Wound #1 Left,Lateral Lower Leg: ABD pad Non-adherent pad Dressing Change Frequency: Wound #1 Left,Lateral Lower Leg: Change dressing every week Follow-up Appointments: Wound #1 Left,Lateral  Lower Leg: Return Appointment in 1 week. Nurse Visit as needed Edema Control: Wound #1 Left,Lateral Lower Leg: 3 Layer Compression System - Left Lower Extremity - unna to anchor Compression Pump: Use compression pump on left lower extremity for 30 minutes, twice daily. - work up to 1 hour twice daily Compression Pump: Use compression pump on right lower extremity for 30 minutes, twice daily. - work up to 1 hour twice daily Additional Orders / Instructions: Wound #1 Left,Lateral Lower Leg: Vitamin A; Vitamin C, Zinc The following medication(s) was prescribed: lidocaine topical 4 % cream 1 1 cream topical was prescribed at facility At this point I'm gonna suggest that we actually go ahead and tin you with the Current wound care measures he's in agreement with that plan. We are going to subsequently see were things stand at follow-up. If anything changes or worsens he will let me know otherwise my hope is that this will close shortly for him. [name], GIANNOTTI ([id number]) Please see above for specific wound care  orders. We will see patient for re-evaluation in 1 week(s) here in the clinic. If anything worsens or changes patient will contact our office for additional recommendations. Electronic Signature(s) Signed: 11/13/2017 12:11:53 PM By: Lenda Kelp PA-C Entered By: Lenda Kelp on 11/13/2017 08:39:09 SHEAMUS, HASTING ([id number]) -------------------------------------------------------------------------------- ROS/PFSH Details Patient Name: OLSEN, MCCUTCHAN. Date of Service: 11/12/2017 10:00 AM Medical Record Number: [id number] Patient Account Number: [id number] Date of Birth/Sex: Apr 12, 1950 (66 y.o. M) Treating RN: Phillis Haggis Primary Care Provider: Leotis Shames Other Clinician: Referring Provider: Leotis Shames Treating Provider/Extender: STONE III, Aprille Sawhney Weeks in Treatment: 9 Information Obtained From Patient Wound History Do you currently have one or more open  woundso Yes How many open wounds do you currently haveo 1 Approximately how long have you had your woundso nov 2018 How have you been treating your wound(s) until nowo band-aide, neo sporinHas your wound(s) ever healed and then re-openedo No Have you had any lab work done in the past montho No Have you tested positive for an antibiotic resistant organism (MRSA, VRE)o No Have you tested positive for osteomyelitis (bone infection)o No Have you had any tests for circulation on your legso No Have you had other problems associated with your woundso Swelling Constitutional Symptoms (General Health) Complaints and Symptoms: Negative for: Fever; Chills Cardiovascular Complaints and Symptoms: Positive for: LE edema Medical History: Positive for: Hypertension Eyes Medical History: Positive for: Cataracts - surgery Hematologic/Lymphatic Medical History: Positive for: Anemia; Lymphedema Respiratory Complaints and Symptoms: No Complaints or Symptoms Endocrine Medical History: Positive for: Type II Diabetes Time with diabetes: 15 yrs Treated with: Insulin, Oral agents ELDON, ZIETLOW ([id number]) Blood sugar tested every day: Yes Tested : Psychiatric Complaints and Symptoms: No Complaints or Symptoms HBO Extended History Items Eyes: Cataracts Immunizations Pneumococcal Vaccine: Received Pneumococcal Vaccination: Yes Implantable Devices Family and Social History Cancer: Yes - Father; Diabetes: No; Heart Disease: Yes - Father; Hereditary Spherocytosis: No; Hypertension: No; Kidney Disease: No; Lung Disease: No; Seizures: No; Stroke: No; Thyroid Problems: No; Tuberculosis: No; Never smoker; Marital Status - Married; Alcohol Use: Never; Drug Use: No History; Caffeine Use: Daily; Financial Concerns: No; Food, Clothing or Shelter Needs: No; Support System Lacking: No; Transportation Concerns: No; Advanced Directives: No; Patient does not want information on Advanced Directives; Do not  resuscitate: No; Living Will: Yes (Copy provided); Medical Power of Attorney: No Physician Affirmation I have reviewed and agree with the above information. Electronic Signature(s) Signed: 11/13/2017 12:11:53 PM By: Lenda Kelp PA-C Signed: 11/13/2017 4:43:18 PM By: Alejandro Mulling Entered By: Lenda Kelp on 11/13/2017 08:38:24 GRANT, SWAGER ([id number]) -------------------------------------------------------------------------------- SuperBill Details Patient Name: JVON, MERONEY. Date of Service: 11/12/2017 Medical Record Number: [id number] Patient Account Number: [id number] Date of Birth/Sex: [birth year] (66 y.o. M) Treating RN: Phillis Haggis Primary Care Provider: Leotis Shames Other Clinician: Referring Provider: Leotis Shames Treating Provider/Extender: STONE III, Devlin Brink Weeks in Treatment: 9 Diagnosis Coding ICD-10 Codes Code Description [phone] Non-pressure chronic ulcer of other part of left lower leg with fat layer exposed E11.622 Type 2 diabetes mellitus with other skin ulcer I89.0 Lymphedema, not elsewhere classified G70.00 Myasthenia gravis without (acute) exacerbation I87.2 Venous insufficiency (chronic) (peripheral) I73.9 Peripheral vascular disease, unspecified Facility Procedures CPT4 Code Description: [id number] 11042 - DEB SUBQ TISSUE 20 SQ CM/< ICD-10 Diagnosis Description 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 of other part of left lower leg with Modifier: fat layer expos Quantity: 1 ed Physician Procedures CPT4 Code Description: [id number] 11042 - WC PHYS SUBQ TISS 20 SQ CM  ICD-10 Diagnosis Description 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 of other part of left lower leg with Modifier: fat layer expos Quantity: 1 ed Electronic Signature(s) Signed: 11/13/2017 12:11:53 PM By: Lenda Kelp PA-C Entered By: Lenda Kelp on 11/13/2017 16:10:96

## 2017-11-29 ENCOUNTER — Ambulatory Visit: Payer: BLUE CROSS/BLUE SHIELD | Admitting: Physician Assistant

## 2017-12-03 ENCOUNTER — Encounter: Payer: BLUE CROSS/BLUE SHIELD | Attending: Physician Assistant | Admitting: Physician Assistant

## 2017-12-03 DIAGNOSIS — E11622 Type 2 diabetes mellitus with other skin ulcer: Secondary | ICD-10-CM | POA: Diagnosis present

## 2017-12-03 DIAGNOSIS — I10 Essential (primary) hypertension: Secondary | ICD-10-CM | POA: Insufficient documentation

## 2017-12-03 DIAGNOSIS — I890 Lymphedema, not elsewhere classified: Secondary | ICD-10-CM | POA: Diagnosis not present

## 2017-12-03 DIAGNOSIS — E1151 Type 2 diabetes mellitus with diabetic peripheral angiopathy without gangrene: Secondary | ICD-10-CM | POA: Insufficient documentation

## 2017-12-03 DIAGNOSIS — I872 Venous insufficiency (chronic) (peripheral): Secondary | ICD-10-CM | POA: Insufficient documentation

## 2017-12-03 DIAGNOSIS — Z8249 Family history of ischemic heart disease and other diseases of the circulatory system: Secondary | ICD-10-CM | POA: Insufficient documentation

## 2017-12-03 DIAGNOSIS — L97822 Non-pressure chronic ulcer of other part of left lower leg with fat layer exposed: Secondary | ICD-10-CM | POA: Diagnosis not present

## 2017-12-03 DIAGNOSIS — G7000 Myasthenia gravis without (acute) exacerbation: Secondary | ICD-10-CM | POA: Insufficient documentation

## 2017-12-04 NOTE — Progress Notes (Signed)
[name] ([id number]) Visit Report for 12/03/2017 Arrival Information Details Patient Name: [name], [name]. Date of Service: 12/03/2017 9:15 AM Medical Record Number: [id number] Patient Account Number: [id number] Date of Birth/Sex: July 16, 1950 (66 y.o. M) Treating RN: [name] Primary Care Hunt Zajicek: Leotis Shames Other Clinician: Referring Shonya Sumida: Leotis Shames Treating Brenner Visconti/Extender: STONE III, HOYT Weeks in Treatment: 12 Visit Information History Since Last Visit Added or deleted any medications: No Patient Arrived: Ambulatory Any new allergies or adverse reactions: No Arrival Time: 09:15 Had a fall or experienced change in No Accompanied By: self activities of daily living that may affect Transfer Assistance: None risk of falls: Patient Identification Verified: Yes Signs or symptoms of abuse/neglect since last visito No Secondary Verification Process Completed: Yes Hospitalized since last visit: No Patient Requires Transmission-Based No Implantable device outside of the clinic excluding No Precautions: cellular tissue based products placed in the center Patient Has Alerts: Yes since last visit: Has Dressing in Place as Prescribed: Yes Pain Present Now: No Electronic Signature(s) Signed: 12/03/2017 11:45:15 AM By: Dayton Martes RCP, RRT, CHT Entered By: Dayton Martes on 12/03/2017 09:16:15 Ganser, [name] ([id number]) -------------------------------------------------------------------------------- Compression Therapy Details Patient Name: [name], [name] Date of Service: 12/03/2017 9:15 AM Medical Record Number: [id number] Patient Account Number: [id number] Date of Birth/Sex: 14-Sep-1950 (66 y.o. M) Treating RN: [name] Primary Care Vangie Henthorn: Leotis Shames Other Clinician: Referring Azir Muzyka: Leotis Shames Treating Andreas Sobolewski/Extender: STONE III, HOYT Weeks in Treatment: 12 Compression Therapy Performed for  Wound Assessment: Wound #1 Left,Lateral Lower Leg Performed By: Clinician Douglas Sites, RN Compression Type: Three Layer Pre Treatment ABI: 1.5 Post Procedure Diagnosis Same as Pre-procedure Electronic Signature(s) Signed: 12/03/2017 4:54:17 PM By: [name] Entered By: [name] on 12/03/2017 09:44:58 Karapetyan, [name] ([id number]) -------------------------------------------------------------------------------- Encounter Discharge Information Details Patient Name: [name], [name] Date of Service: 12/03/2017 9:15 AM Medical Record Number: [id number] Patient Account Number: [id number] Date of Birth/Sex: 12/20/1950 (66 y.o. M) Treating RN: [name] Primary Care Annabeth Tortora: Leotis Shames Other Clinician: Referring Arilynn Blakeney: Leotis Shames Treating Kurt Hoffmeier/Extender: Linwood Dibbles, HOYT Weeks in Treatment: 12 Encounter Discharge Information Items Discharge Condition: Stable Ambulatory Status: Ambulatory Discharge Destination: Home Transportation: Private Auto Accompanied By: self Schedule Follow-up Appointment: Yes Clinical Summary of Care: Electronic Signature(s) Signed: 12/03/2017 4:54:17 PM By: [name] Entered By: [name] on 12/03/2017 10:00:40 [name] ([id number]) -------------------------------------------------------------------------------- Lower Extremity Assessment Details Patient Name: [name], [name] Date of Service: 12/03/2017 9:15 AM Medical Record Number: [id number] Patient Account Number: [id number] Date of Birth/Sex: [birth year] (66 y.o. M) Treating RN: Rema Jasmine Primary Care Myron Lona: Leotis Shames Other Clinician: Referring Cora Brierley: Leotis Shames Treating Temprence Rhines/Extender: STONE III, HOYT Weeks in Treatment: 12 Edema Assessment Assessed: [Left: No] [Right: No] Edema: [Left: N] [Right: o] Calf Left: Right: Point of Measurement: 35 cm From Medial Instep 40 cm cm Ankle Left: Right: Point of Measurement: 14 cm From  Medial Instep 22.5 cm cm Vascular Assessment Claudication: Claudication Assessment [Left:None] Pulses: Posterior Tibial Palpable: [Left:Yes] Extremity colors, hair growth, and conditions: Extremity Color: [Left:Normal] Hair Growth on Extremity: [Left:No] Temperature of Extremity: [Left:Warm] Capillary Refill: [Left:< 3 seconds] Toe Nail Assessment Left: Right: Thick: No Discolored: No Deformed: No Improper Length and Hygiene: No Electronic Signature(s) Signed: 12/03/2017 3:05:43 PM By: Rema Jasmine Entered By: Rema Jasmine on 12/03/2017 09:31:59 Metoyer, [name] ([id number]) -------------------------------------------------------------------------------- Multi Wound Chart Details Patient Name: [name], [name] Date of Service: 12/03/2017 9:15 AM Medical Record Number: [id number] Patient Account Number: [id number] Date of Birth/Sex: [birth year] (66  y.o. M) Treating RN: [name] Primary Care Percy Comp: Leotis Shames Other Clinician: Referring Key Cen: Leotis Shames Treating Dartha Rozzell/Extender: STONE III, HOYT Weeks in Treatment: 12 Vital Signs Height(in): 72 Pulse(bpm): 60 Weight(lbs): 323.9 Blood Pressure(mmHg): 149/65 Body Mass Index(BMI): 44 Temperature(F): 97.7 Respiratory Rate 16 (breaths/min): Photos: [N/A:N/A] Wound Location: Left Lower Leg - Lateral N/A N/A Wounding Event: Gradually Appeared N/A N/A Primary Etiology: Diabetic Wound/Ulcer of the N/A N/A Lower Extremity Comorbid History: Cataracts, Anemia, N/A N/A Lymphedema, Hypertension, Type II Diabetes Date Acquired: 08/26/2017 N/A N/A Weeks of Treatment: 12 N/A N/A Wound Status: Open N/A N/A Measurements L x W x D 0.4x0.4x0.1 N/A N/A (cm) Area (cm) : 0.126 N/A N/A Volume (cm) : 0.013 N/A N/A % Reduction in Area: 96.00% N/A N/A % Reduction in Volume: 95.80% N/A N/A Classification: Grade 1 N/A N/A Exudate Amount: Small N/A N/A Exudate Type: Serous N/A N/A Exudate Color: amber N/A N/A Wound  Margin: Flat and Intact N/A N/A Granulation Amount: Large (67-100%) N/A N/A Granulation Quality: Pink, Hyper-granulation N/A N/A Necrotic Amount: None Present (0%) N/A N/A Exposed Structures: Fat Layer (Subcutaneous N/A N/A Tissue) Exposed: Yes Fascia: No Tendon: No Muscle: No Shrader, Remijio L. ([id number]) Joint: No Bone: No Epithelialization: Medium (34-66%) N/A N/A Periwound Skin Texture: Excoriation: No N/A N/A Induration: No Callus: No Crepitus: No Rash: No Scarring: No Periwound Skin Moisture: Maceration: No N/A N/A Dry/Scaly: No Periwound Skin Color: Erythema: Yes N/A N/A Atrophie Blanche: No Cyanosis: No Ecchymosis: No Hemosiderin Staining: No Mottled: No Pallor: No Rubor: No Erythema Location: Circumferential N/A N/A Temperature: No Abnormality N/A N/A Tenderness on Palpation: Yes N/A N/A Wound Preparation: Ulcer Cleansing: N/A N/A Rinsed/Irrigated with Saline, Other: soap and water Topical Anesthetic Applied: None Treatment Notes Electronic Signature(s) Signed: 12/03/2017 4:54:17 PM By: [name] Entered By: [name] on 12/03/2017 09:43:15 Araiza, [name] ([id number]) -------------------------------------------------------------------------------- Multi-Disciplinary Care Plan Details Patient Name: BRETTON, TANDY L. Date of Service: 12/03/2017 9:15 AM Medical Record Number: [id number] Patient Account Number: [id number] Date of Birth/Sex: 08-May-1950 (66 y.o. M) Treating RN: [name] Primary Care Ilir Mahrt: Leotis Shames Other Clinician: Referring Benjamine Strout: Leotis Shames Treating Alam Guterrez/Extender: STONE III, HOYT Weeks in Treatment: 12 Active Inactive ` Orientation to the Wound Care Program Nursing Diagnoses: Knowledge deficit related to the wound healing center program Goals: Patient/caregiver will verbalize understanding of the Wound Healing Center Program Date Initiated: 09/09/2017 Target Resolution Date: 09/30/2017 Goal  Status: Active Interventions: Provide education on orientation to the wound center Notes: ` Wound/Skin Impairment Nursing Diagnoses: Impaired tissue integrity Goals: Patient/caregiver will verbalize understanding of skin care regimen Date Initiated: 09/09/2017 Target Resolution Date: 09/28/2017 Goal Status: Active Ulcer/skin breakdown will have a volume reduction of 30% by week 4 Date Initiated: 09/09/2017 Target Resolution Date: 09/28/2017 Goal Status: Active Interventions: Assess patient/caregiver ability to obtain necessary supplies Assess patient/caregiver ability to perform ulcer/skin care regimen upon admission and as needed Assess ulceration(s) every visit Treatment Activities: Skin care regimen initiated : 09/09/2017 Notes: Electronic Signature(s) Signed: 12/03/2017 4:54:17 PM By: Milus Height, [name] ([id number]) Entered By: [name] on 12/03/2017 09:43:06 Shere, [name] ([id number]) -------------------------------------------------------------------------------- Pain Assessment Details Patient Name: ANDERSON, MIDDLEBROOKS L. Date of Service: 12/03/2017 9:15 AM Medical Record Number: [id number] Patient Account Number: [id number] Date of Birth/Sex: [birth year] (66 y.o. M) Treating RN: [name] Primary Care Andrianna Manalang: Leotis Shames Other Clinician: Referring Tene Gato: Leotis Shames Treating Danis Pembleton/Extender: STONE III, HOYT Weeks in Treatment: 12 Active Problems Location of Pain Severity and Description of Pain Patient Has Paino No Site  Locations Pain Management and Medication Current Pain Management: Electronic Signature(s) Signed: 12/03/2017 11:45:15 AM By: Dayton Martes RCP, RRT, CHT Signed: 12/03/2017 4:54:17 PM By: [name] Entered By: Dayton Martes on 12/03/2017 09:16:23 [name] ([id number]) -------------------------------------------------------------------------------- Patient/Caregiver Education  Details Patient Name: JAHZIER, VILLALON. Date of Service: 12/03/2017 9:15 AM Medical Record Number: [id number] Patient Account Number: [id number] Date of Birth/Gender: [birth year] (66 y.o. M) Treating RN: [name] Primary Care Physician: Leotis Shames Other Clinician: Referring Physician: Leotis Shames Treating Physician/Extender: Skeet Simmer in Treatment: 12 Education Assessment Education Provided To: Patient Education Topics Provided Venous: Handouts: Other: need for ongoing compression Methods: Explain/Verbal Responses: State content correctly Electronic Signature(s) Signed: 12/03/2017 4:54:17 PM By: [name] Entered By: [name] on 12/03/2017 10:01:00 [name] ([id number]) -------------------------------------------------------------------------------- Wound Assessment Details Patient Name: KALEM, ROCKWELL L. Date of Service: 12/03/2017 9:15 AM Medical Record Number: [id number] Patient Account Number: [id number] Date of Birth/Sex: 10-25-1950 (66 y.o. M) Treating RN: [name] Primary Care Anthonee Gelin: Leotis Shames Other Clinician: Referring Poonam Woehrle: Leotis Shames Treating Niels Cranshaw/Extender: STONE III, HOYT Weeks in Treatment: 12 Wound Status Wound Number: 1 Primary Diabetic Wound/Ulcer of the Lower Extremity Etiology: Wound Location: Left Lower Leg - Lateral Wound Status: Open Wounding Event: Gradually Appeared Comorbid Cataracts, Anemia, Lymphedema, Date Acquired: 08/26/2017 History: Hypertension, Type II Diabetes Weeks Of Treatment: 12 Clustered Wound: No Photos Wound Measurements Length: (cm) 0.4 Width: (cm) 0.4 Depth: (cm) 0.1 Area: (cm) 0.126 Volume: (cm) 0.013 % Reduction in Area: 96% % Reduction in Volume: 95.8% Epithelialization: Medium (34-66%) Tunneling: No Undermining: No Wound Description Classification: Grade 1 Foul Odor Wound Margin: Flat and Intact Slough/Fib Exudate Amount: Small Exudate Type:  Serous Exudate Color: amber After Cleansing: No rino Yes Wound Bed Granulation Amount: Large (67-100%) Exposed Structure Granulation Quality: Pink, Hyper-granulation Fascia Exposed: No Necrotic Amount: None Present (0%) Fat Layer (Subcutaneous Tissue) Exposed: Yes Tendon Exposed: No Muscle Exposed: No Joint Exposed: No Bone Exposed: No Periwound Skin Texture Texture Color Raatz, Tzvi L. ([id number]) No Abnormalities Noted: No No Abnormalities Noted: No Callus: No Atrophie Blanche: No Crepitus: No Cyanosis: No Excoriation: No Ecchymosis: No Induration: No Erythema: Yes Rash: No Erythema Location: Circumferential Scarring: No Hemosiderin Staining: No Mottled: No Moisture Pallor: No No Abnormalities Noted: No Rubor: No Dry / Scaly: No Maceration: No Temperature / Pain Temperature: No Abnormality Tenderness on Palpation: Yes Wound Preparation Ulcer Cleansing: Rinsed/Irrigated with Saline, Other: soap and water, Topical Anesthetic Applied: None Treatment Notes Wound #1 (Left, Lateral Lower Leg) 1. Cleansed with: Cleanse wound with antibacterial soap and water 2. Anesthetic Topical Lidocaine 4% cream to wound bed prior to debridement 3. Peri-wound Care: Moisturizing lotion 4. Dressing Applied: Hydrafera Blue 5. Secondary Dressing Applied Non-Adherent pad 7. Secured with 3 Layer Compression System - Left Lower Extremity Notes unna to anchor Electronic Signature(s) Signed: 12/03/2017 4:54:17 PM By: [name] Entered By: [name] on 12/03/2017 09:42:47 Branca, [name] ([id number]) -------------------------------------------------------------------------------- Vitals Details Patient Name: URIYAH, RASKA L. Date of Service: 12/03/2017 9:15 AM Medical Record Number: [id number] Patient Account Number: [id number] Date of Birth/Sex: 1951-02-23 (66 y.o. M) Treating RN: [name] Primary Care Demontrez Rindfleisch: Leotis Shames Other  Clinician: Referring Keeva Reisen: Leotis Shames Treating Lyall Faciane/Extender: STONE III, HOYT Weeks in Treatment: 12 Vital Signs Time Taken: 09:15 Temperature (F): 97.7 Height (in): 72 Pulse (bpm): 60 Weight (lbs): 323.9 Respiratory Rate (breaths/min): 16 Body Mass Index (BMI): 43.9 Blood Pressure (mmHg): 149/65 Reference Range: 80 - 120 mg / dl Electronic Signature(s) Signed:  12/03/2017 11:45:15 AM By: Dayton Martes RCP, RRT, CHT Entered By: Dayton Martes on 12/03/2017 09:18:35

## 2017-12-04 NOTE — Progress Notes (Signed)
JULIE, PAOLINI ([id number]) Visit Report for 12/03/2017 Chief Complaint Document Details Patient Name: [name], [name]. Date of Service: 12/03/2017 9:15 AM Medical Record Number: [id number] Patient Account Number: [id number] Date of Birth/Sex: 01-07-1951 (66 y.o. M) Treating RN: Curtis Sites Primary Care Provider: Leotis Shames Other Clinician: Referring Provider: Leotis Shames Treating Provider/Extender: Linwood Dibbles, HOYT Weeks in Treatment: 12 Information Obtained from: Patient Chief Complaint Left leg ulcer Electronic Signature(s) Signed: 12/03/2017 1:59:57 PM By: Lenda Kelp PA-C Entered By: Lenda Kelp on 12/03/2017 09:40:20 Montoya, [name] ([id number]) -------------------------------------------------------------------------------- HPI Details Patient Name: [name], [name]. Date of Service: 12/03/2017 9:15 AM Medical Record Number: [id number] Patient Account Number: [id number] Date of Birth/Sex: [birth year] (66 y.o. M) Treating RN: Curtis Sites Primary Care Provider: Leotis Shames Other Clinician: Referring Provider: Leotis Shames Treating Provider/Extender: STONE III, HOYT Weeks in Treatment: 12 History of Present Illness HPI Description: MYASTHENIA GRAVIS (MG) MEDICATION ALERT: Medications that increase weakness in most MG patients should be avoided or used only with great caution. These include: - Fluoroquinolone antibiotics: moxifloxacin (Aveloxo), ciprofloxacin (Ciproo, Proquin XRo), norfloxacin (Noroxino), levofloxacin (Levaquino), ofloxacin (Floxino), gemifloxacin (Factiveo) - Aminoglycoside antibiotics: tobramycin, gentamycin, kanamycin, neomycin, streptomycin - Macrolide antibiotics: erythromycin, azithromycin (Z-Pako, Zithromaxo), telithromycin (Keteko) - Botulinum toxins: Botoxo, Myobloco, Dysporto, Xeomino - Beta-blockers such as propanolol or timolol eyedrops (Timoptico) - Calcium channel blockers (blood pressure medications) - Quinine,  quinidine or procainamide - D-penicillamine (do not confuse with penicillin) - Interferon - Magnesium salts: milk of magnesia, some antacids (Maaloxo, Mylantao) - Curare and related drugs (usually used only during surgery) Other medications may produce problems in some MG patients. Be sure your doctor/dentist knows you have MG when any new medication is prescribed. Contact your myasthenia doctor if you or your local doctor has questions about medications. 09/09/17 on evaluation today patient actually appears for initial evaluation today concerning his left lateral lower extremity ulcer which has been present he tells me since November 2018. He states that gas logs that he'd forgotten he had said somewhere actually fell over landing on his leg causing the injury and unfortunately the left leg has never fully recovered since that time. He does note that there was some improvement initially and now it's pretty much stagnant. He does have venous stasis chronically although he states it's been worse and pass compared to current. He has also been diagnosed with lymphedema although again this is something else that has been deemed the worst in the past compared to present. He does have lymphedema pumps although he really has not been using those on a regular basis. Patient does have diabetes mellitus type II in other than the venous insufficiency and prayerful vascular disease he also has myasthenia gravis board she is on medications long-term. Currently he has been tolerating the medications for this very well he does have a list as noted above in the HPI of medications that he is to avoid that we need to keep in mind. Nonetheless the patient seems to be doing fairly well in general. I do believe his peripheral edema may be contributing to the nonhealing factor in regard to this ulcer. 09/16/17 on evaluation today patient appears to be doing rather well his wound actually appears to show signs of  improvement compared to previous evaluation which is great news. Overall his wound is a little larger but again I did debride the wound sharply last week so this is definitely expected as far as I was concerned. Nonetheless the wound bed itself seems to be  doing excellent. He is having no discomfort normally he does have a little bit of pain with cleansing of the wound by myself today. 09/23/17 on evaluation today patient actually appears to be showing signs of good improvement in regard to his left lateral motion the ulcer. He's been tolerating the dressing changes without complication. Fortunately there does not appear to be any evidence of infection at this point which is great news. Overall I'm extremely happy that he seems to be doing so well and the patient likewise wants to be healed as soon as possible he actually has a pull at home he would love to be in but he hasn't been able to get into this due to what's going on currently. 10/01/16 on evaluation today patient appears to be doing rather well in regard to his left lateral lower Trinity ulcer. He's been tolerating the dressing changes without complication. There does not appear to be any evidence of infection at this time. Overall I'm very pleased with the progress that has been made up to this point. I do believe after today will likely be able to switch to a different dressing. HUSEIN, GUEDES ([id number]) 10/08/17 on evaluation today patient actually appears to be doing very well at this point in time. His wound is showing signs of progress which is good news I am not even having to debride as much away as far as the slough is concerned. He does have some Slough which requires debridement at this point however. No fevers, chills, nausea, or vomiting noted at this time. 10/15/17 on evaluation today patient presents for follow-up concerning his ongoing lower extremity ulcer of the left lower extremity. He has been tolerating the dressing  changes without complication. With that being said he tells me at this point that his wound has not really been causing much in the way of discomfort. The wound itself seems to be making good progress with a good granular bed now at this point he did have some Slough on the surface of the wound this was minimal. He has good epithelialization from the anterior tourist posterior portion of the wound. 10/22/17 on evaluation today patient appears to be doing very well in regard to his left lateral lower extremity ulcer. He is shown signs of improvement which is good news. He's been tolerating the dressing changes without complication. In general I think he has done well with the wraps and though he has not made as much progress this week is he has in weeks past I still feel he's making good progress. We will see how things appear in one weeks time. 10/29/17-He is here in follow-up evaluation for a left lower extremity ulcer. He is tolerating dressing changes and voicing no complaints or concerns. We will continue with the same treatment plan, he will return for nurse visit on Monday and follow-up in 2 weeks. 11/12/17 on evaluation today patient's left lateral lower Trinity ulcer appears to be doing very well at this point. He's been tolerating the dressing changes without complication. Fortunately his wounds have been making great progress and specifically the remaining wound has shown signs of new epithelialization and granulation week by week. Nonetheless he still does have a little bit of work to do as far as getting this to completely heal. No fevers, chills, nausea, or vomiting noted at this time. 11/19/17 on evaluation today patient appears to be doing fairly well at this point in regard to the left lateral lower Trinity ulcer. He has had some  improvement since his last visit in the office which is good news. Overall I'm pleased with the progress there's no evidence of infection in general I think  things are making great improvement. 12/03/17 on evaluation today patient actually appears to be doing better in regard to his left lower extremity ulcer. He's been tolerating the dressing changes without complication. With that being said he does seem to be making good progress at this time. Fortunately there is no evidence of infection. He does have some hyper granular tissue which I think has slightly slowed down his progress. Nonetheless I think that this is a very minor setback and I believe that his wound will likely be healed in the next 1-2 weeks. Electronic Signature(s) Signed: 12/03/2017 1:59:57 PM By: Lenda Kelp PA-C Entered By: Lenda Kelp on 12/03/2017 09:51:18 Guerin, [name] ([id number]) -------------------------------------------------------------------------------- Gaynelle Adu TISS Details Patient Name: [name], Douglas L. Date of Service: 12/03/2017 9:15 AM Medical Record Number: [id number] Patient Account Number: [id number] Date of Birth/Sex: 1950/09/14 (66 y.o. M) Treating RN: Curtis Sites Primary Care Provider: Leotis Shames Other Clinician: Referring Provider: Leotis Shames Treating Provider/Extender: STONE III, HOYT Weeks in Treatment: 12 Procedure Performed for: Wound #1 Left,Lateral Lower Leg Performed By: Physician Trellis Paganini., PA-C Post Procedure Diagnosis Same as Pre-procedure Electronic Signature(s) Signed: 12/03/2017 4:54:17 PM By: Curtis Sites Entered By: Curtis Sites on 12/03/2017 09:44:35 Lun, [name] ([id number]) -------------------------------------------------------------------------------- Physical Exam Details Patient Name: [name], Douglas L. Date of Service: 12/03/2017 9:15 AM Medical Record Number: [id number] Patient Account Number: [id number] Date of Birth/Sex: Jul 05, 1950 (66 y.o. M) Treating RN: Curtis Sites Primary Care Provider: Leotis Shames Other Clinician: Referring Provider: Leotis Shames Treating Provider/Extender: STONE III, HOYT Weeks in Treatment: 12 Constitutional Well-nourished and well-hydrated in no acute distress. Respiratory normal breathing without difficulty. Cardiovascular trace pitting edema of the bilateral lower extremities. Psychiatric this patient is able to make decisions and demonstrates good insight into disease process. Alert and Oriented x 3. pleasant and cooperative. Notes Patient's wound bed shows evidence of again some hyper granulation with actually fairly good epithelialization noted at this point. His swelling is under excellent control he continues to use his lymphedema pumps as well. No sharp debridement was necessary today. I did utilize silver nitrate to chemically cauterize the hyper granular tissue. Electronic Signature(s) Signed: 12/03/2017 1:59:57 PM By: Lenda Kelp PA-C Entered By: Lenda Kelp on 12/03/2017 09:51:57 Dorff, [name] ([id number]) -------------------------------------------------------------------------------- Physician Orders Details Patient Name: ERRICK, SALTS. Date of Service: 12/03/2017 9:15 AM Medical Record Number: [id number] Patient Account Number: [id number] Date of Birth/Sex: 1950-10-28 (66 y.o. M) Treating RN: Curtis Sites Primary Care Provider: Leotis Shames Other Clinician: Referring Provider: Leotis Shames Treating Provider/Extender: Linwood Dibbles, HOYT Weeks in Treatment: 12 Verbal / Phone Orders: No Diagnosis Coding ICD-10 Coding Code Description [phone] Non-pressure chronic ulcer of other part of left lower leg with fat layer exposed E11.622 Type 2 diabetes mellitus with other skin ulcer I89.0 Lymphedema, not elsewhere classified G70.00 Myasthenia gravis without (acute) exacerbation I87.2 Venous insufficiency (chronic) (peripheral) I73.9 Peripheral vascular disease, unspecified Wound Cleansing Wound #1 Left,Lateral Lower Leg o Clean wound with Normal Saline. o Cleanse  wound with mild soap and water Anesthetic (add to Medication List) Wound #1 Left,Lateral Lower Leg o Topical Lidocaine 4% cream applied to wound bed prior to debridement (In Clinic Only). Skin Barriers/Peri-Wound Care Wound #1 Left,Lateral Lower Leg o Moisturizing lotion Primary Wound Dressing Wound #1 Left,Lateral Lower Leg o Hydrafera Blue Ready  Transfer Secondary Dressing Wound #1 Left,Lateral Lower Leg o Non-adherent pad Dressing Change Frequency Wound #1 Left,Lateral Lower Leg o Change dressing every week Follow-up Appointments Wound #1 Left,Lateral Lower Leg o Return Appointment in 1 week. Edema Control Wound #1 Left,Lateral Lower Leg OLUWANIFEMI, SUSMAN. ([id number]) o 3 Layer Compression System - Left Lower Extremity - unna to anchor o Compression Pump: Use compression pump on left lower extremity for 30 minutes, twice daily. - work up to 1 hour twice daily o Compression Pump: Use compression pump on right lower extremity for 30 minutes, twice daily. - work up to 1 hour twice daily Additional Orders / Instructions Wound #1 Left,Lateral Lower Leg o Vitamin A; Vitamin C, Zinc Electronic Signature(s) Signed: 12/03/2017 1:59:57 PM By: Lenda Kelp PA-C Signed: 12/03/2017 4:54:17 PM By: Curtis Sites Entered By: Curtis Sites on 12/03/2017 09:45:46 Lentsch, [name] ([id number]) -------------------------------------------------------------------------------- Problem List Details Patient Name: [name], Douglas L. Date of Service: 12/03/2017 9:15 AM Medical Record Number: [id number] Patient Account Number: [id number] Date of Birth/Sex: [birth year] (66 y.o. M) Treating RN: Curtis Sites Primary Care Provider: Leotis Shames Other Clinician: Referring Provider: Leotis Shames Treating Provider/Extender: Linwood Dibbles, HOYT Weeks in Treatment: 12 Active Problems ICD-10 Evaluated Encounter Code Description Active Date Today Diagnosis L97.822 Non-pressure  chronic ulcer of other part of left lower leg with 09/09/2017 No Yes fat layer exposed E11.622 Type 2 diabetes mellitus with other skin ulcer 09/09/2017 No Yes I89.0 Lymphedema, not elsewhere classified 09/09/2017 No Yes G70.00 Myasthenia gravis without (acute) exacerbation 09/09/2017 No Yes I87.2 Venous insufficiency (chronic) (peripheral) 09/09/2017 No Yes I73.9 Peripheral vascular disease, unspecified 09/09/2017 No Yes Inactive Problems Resolved Problems Electronic Signature(s) Signed: 12/03/2017 1:59:57 PM By: Lenda Kelp PA-C Entered By: Lenda Kelp on 12/03/2017 09:40:14 Magouirk, [name] ([id number]) -------------------------------------------------------------------------------- Progress Note Details Patient Name: [name]. Date of Service: 12/03/2017 9:15 AM Medical Record Number: [id number] Patient Account Number: [id number] Date of Birth/Sex: 1951-02-09 (66 y.o. M) Treating RN: Curtis Sites Primary Care Provider: Leotis Shames Other Clinician: Referring Provider: Leotis Shames Treating Provider/Extender: STONE III, HOYT Weeks in Treatment: 12 Subjective Chief Complaint Information obtained from Patient Left leg ulcer History of Present Illness (HPI) MYASTHENIA GRAVIS (MG) MEDICATION ALERT: Medications that increase weakness in most MG patients should be avoided or used only with great caution. These include: - Fluoroquinolone antibiotics: moxifloxacin (Avelox), ciprofloxacin (Cipro, Proquin XR), norfloxacin (Noroxin), levofloxacin (Levaquin), ofloxacin (Floxin), gemifloxacin (Factive) - Aminoglycoside antibiotics: tobramycin, gentamycin, kanamycin, neomycin, streptomycin - Macrolide antibiotics: erythromycin, azithromycin (Z-Pak, Zithromax), telithromycin (Ketek) - Botulinum toxins: Botox, Myobloc, Dysport, Xeomin - Beta-blockers such as propanolol or timolol eyedrops (Timoptic) - Calcium channel blockers (blood pressure  medications) - Quinine, quinidine or procainamide - D-penicillamine (do not confuse with penicillin) - Interferon - Magnesium salts: milk of magnesia, some antacids (Maalox, Mylanta) - Curare and related drugs (usually used only during surgery) Other medications may produce problems in some MG patients. Be sure your doctor/dentist knows you have MG when any new medication is prescribed. Contact your myasthenia doctor if you or your local doctor has questions about medications. 09/09/17 on evaluation today patient actually appears for initial evaluation today concerning his left lateral lower extremity ulcer which has been present he tells me since November 2018. He states that gas logs that he'd forgotten he had said somewhere actually fell over landing on his leg causing the injury and unfortunately the left leg has never fully recovered since that time. He does note that there was some improvement initially  and now it's pretty much stagnant. He does have venous stasis chronically although he states it's been worse and pass compared to current. He has also been diagnosed with lymphedema although again this is something else that has been deemed the worst in the past compared to present. He does have lymphedema pumps although he really has not been using those on a regular basis. Patient does have diabetes mellitus type II in other than the venous insufficiency and prayerful vascular disease he also has myasthenia gravis board she is on medications long-term. Currently he has been tolerating the medications for this very well he does have a list as noted above in the HPI of medications that he is to avoid that we need to keep in mind. Nonetheless the patient seems to be doing fairly well in general. I do believe his peripheral edema may be contributing to the nonhealing factor in regard to this ulcer. 09/16/17 on evaluation today patient appears to be doing rather well his wound actually appears  to show signs of improvement compared to previous evaluation which is great news. Overall his wound is a little larger but again I did debride the wound sharply last week so this is definitely expected as far as I was concerned. Nonetheless the wound bed itself seems to be doing excellent. He is having no discomfort normally he does have a little bit of pain with cleansing of the wound by myself today. 09/23/17 on evaluation today patient actually appears to be showing signs of good improvement in regard to his left lateral motion the ulcer. He's been tolerating the dressing changes without complication. Fortunately there does not appear to be any evidence of infection at this point which is great news. Overall I'm extremely happy that he seems to be doing so well and the patient likewise wants to be healed as soon as possible he actually has a pull at home he would love to be in but he hasn't KANOA, PHILLIPPI L. ([id number]) been able to get into this due to what's going on currently. 10/01/16 on evaluation today patient appears to be doing rather well in regard to his left lateral lower Trinity ulcer. He's been tolerating the dressing changes without complication. There does not appear to be any evidence of infection at this time. Overall I'm very pleased with the progress that has been made up to this point. I do believe after today will likely be able to switch to a different dressing. 10/08/17 on evaluation today patient actually appears to be doing very well at this point in time. His wound is showing signs of progress which is good news I am not even having to debride as much away as far as the slough is concerned. He does have some Slough which requires debridement at this point however. No fevers, chills, nausea, or vomiting noted at this time. 10/15/17 on evaluation today patient presents for follow-up concerning his ongoing lower extremity ulcer of the left lower extremity. He has been  tolerating the dressing changes without complication. With that being said he tells me at this point that his wound has not really been causing much in the way of discomfort. The wound itself seems to be making good progress with a good granular bed now at this point he did have some Slough on the surface of the wound this was minimal. He has good epithelialization from the anterior tourist posterior portion of the wound. 10/22/17 on evaluation today patient appears to be doing very well in  regard to his left lateral lower extremity ulcer. He is shown signs of improvement which is good news. He's been tolerating the dressing changes without complication. In general I think he has done well with the wraps and though he has not made as much progress this week is he has in weeks past I still feel he's making good progress. We will see how things appear in one weeks time. 10/29/17-He is here in follow-up evaluation for a left lower extremity ulcer. He is tolerating dressing changes and voicing no complaints or concerns. We will continue with the same treatment plan, he will return for nurse visit on Monday and follow-up in 2 weeks. 11/12/17 on evaluation today patient's left lateral lower Trinity ulcer appears to be doing very well at this point. He's been tolerating the dressing changes without complication. Fortunately his wounds have been making great progress and specifically the remaining wound has shown signs of new epithelialization and granulation week by week. Nonetheless he still does have a little bit of work to do as far as getting this to completely heal. No fevers, chills, nausea, or vomiting noted at this time. 11/19/17 on evaluation today patient appears to be doing fairly well at this point in regard to the left lateral lower Trinity ulcer. He has had some improvement since his last visit in the office which is good news. Overall I'm pleased with the progress there's no evidence of  infection in general I think things are making great improvement. 12/03/17 on evaluation today patient actually appears to be doing better in regard to his left lower extremity ulcer. He's been tolerating the dressing changes without complication. With that being said he does seem to be making good progress at this time. Fortunately there is no evidence of infection. He does have some hyper granular tissue which I think has slightly slowed down his progress. Nonetheless I think that this is a very minor setback and I believe that his wound will likely be healed in the next 1-2 weeks. Patient History Information obtained from Patient. Family History Cancer - Father, Heart Disease - Father, No family history of Diabetes, Hereditary Spherocytosis, Hypertension, Kidney Disease, Lung Disease, Seizures, Stroke, Thyroid Problems, Tuberculosis. Social History Never smoker, Marital Status - Married, Alcohol Use - Never, Drug Use - No History, Caffeine Use - Daily. Review of Systems (ROS) Constitutional Symptoms (General Health) Denies complaints or symptoms of Fever, Chills. Respiratory The patient has no complaints or symptoms. Cardiovascular Complains or has symptoms of LE edema. ANASTASIO, WOGAN ([id number]) Psychiatric The patient has no complaints or symptoms. Objective Constitutional Well-nourished and well-hydrated in no acute distress. Vitals Time Taken: 9:15 AM, Height: 72 in, Weight: 323.9 lbs, BMI: 43.9, Temperature: 97.7 F, Pulse: 60 bpm, Respiratory Rate: 16 breaths/min, Blood Pressure: 149/65 mmHg. Respiratory normal breathing without difficulty. Cardiovascular trace pitting edema of the bilateral lower extremities. Psychiatric this patient is able to make decisions and demonstrates good insight into disease process. Alert and Oriented x 3. pleasant and cooperative. General Notes: Patient's wound bed shows evidence of again some hyper granulation with actually fairly good  epithelialization noted at this point. His swelling is under excellent control he continues to use his lymphedema pumps as well. No sharp debridement was necessary today. I did utilize silver nitrate to chemically cauterize the hyper granular tissue. Integumentary (Hair, Skin) Wound #1 status is Open. Original cause of wound was Gradually Appeared. The wound is located on the Left,Lateral Lower Leg. The wound measures 0.4cm length x  0.4cm width x 0.1cm depth; 0.126cm^2 area and 0.013cm^3 volume. There is Fat Layer (Subcutaneous Tissue) Exposed exposed. There is no tunneling or undermining noted. There is a small amount of serous drainage noted. The wound margin is flat and intact. There is large (67-100%) pink, hyper - granulation within the wound bed. There is no necrotic tissue within the wound bed. The periwound skin appearance exhibited: Erythema. The periwound skin appearance did not exhibit: Callus, Crepitus, Excoriation, Induration, Rash, Scarring, Dry/Scaly, Maceration, Atrophie Blanche, Cyanosis, Ecchymosis, Hemosiderin Staining, Mottled, Pallor, Rubor. The surrounding wound skin color is noted with erythema which is circumferential. Periwound temperature was noted as No Abnormality. The periwound has tenderness on palpation. 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 ([id number]) Peripheral vascular disease, unspecified Procedures Wound #1 Pre-procedure diagnosis of Wound #1 is a Diabetic Wound/Ulcer of the Lower Extremity located on the Left,Lateral Lower Leg . There was a Three Layer Compression Therapy Procedure with a pre-treatment ABI of 1.5 by Curtis Sites, RN. Post procedure Diagnosis Wound #1: Same as  Pre-Procedure Pre-procedure diagnosis of Wound #1 is a Diabetic Wound/Ulcer of the Lower Extremity located on the Left,Lateral Lower Leg . An CHEM CAUT GRANULATION TISS procedure was performed by STONE III, HOYT E., PA-C. Post procedure Diagnosis Wound #1: Same as Pre-Procedure Verbal informed consent: was obtained for chemical cauterization of hypergranulation tissue with silver nitrate. Risk and benefits were discussed with patient. Patient verbalized understanding that chemical cauterization is beneficial for managing hypergranulation tissue, and understands how hypergranulation tissue can prevent proper wound healing. The risk of the procedure includes but is not limited to damage of surrounding normally healing tissue. Procedure: Procedure time: 9:40 AM Topical anesthetic in the form of benzocaine spray was applied prior to the start of the procedure Utilizing one silver nitrate sticks, the area of hypergranulation of the left lateral lower extremity was chemically cauterized. I informed the patient that the dark discoloration is a normal result of the silver nitrate product. Patient tolerated the procedure well with no discomfort. Plan Wound Cleansing: Wound #1 Left,Lateral Lower Leg: Clean wound with Normal Saline. Cleanse wound with mild soap and water Anesthetic (add to Medication List): Wound #1 Left,Lateral Lower Leg: Topical Lidocaine 4% cream applied to wound bed prior to debridement (In Clinic Only). Skin Barriers/Peri-Wound Care: GRYPHON, VANDERVEEN ([id number]) Wound #1 Left,Lateral Lower Leg: Moisturizing lotion Primary Wound Dressing: Wound #1 Left,Lateral Lower Leg: Hydrafera Blue Ready Transfer Secondary Dressing: Wound #1 Left,Lateral Lower Leg: Non-adherent pad Dressing Change Frequency: Wound #1 Left,Lateral Lower Leg: Change dressing every week Follow-up Appointments: Wound #1 Left,Lateral Lower Leg: Return Appointment in 1 week. Edema Control: Wound #1  Left,Lateral Lower Leg: 3 Layer Compression System - Left Lower Extremity - unna to anchor Compression Pump: Use compression pump on left lower extremity for 30 minutes, twice daily. - work up to 1 hour twice daily Compression Pump: Use compression pump on right lower extremity for 30 minutes, twice daily. - work up to 1 hour twice daily Additional Orders / Instructions: Wound #1 Left,Lateral Lower Leg: Vitamin A; Vitamin C, Zinc I am going to suggest at this point that we continue with the above wound care measures for the next week. The patient is in agreement with the plan. We will see were things stand at follow-up. Please  see above for specific wound care orders. We will see patient for re-evaluation in 1 week(s) here in the clinic. If anything worsens or changes patient will contact our office for additional recommendations. Electronic Signature(s) Signed: 12/03/2017 1:59:57 PM By: Lenda Kelp PA-C Entered By: Lenda Kelp on 12/03/2017 09:52:58 Quintyn, Dombek [name] ([id number]) -------------------------------------------------------------------------------- ROS/PFSH Details Patient Name: JONTE, SHILLER. Date of Service: 12/03/2017 9:15 AM Medical Record Number: [id number] Patient Account Number: [id number] Date of Birth/Sex: Dec 11, 1950 (66 y.o. M) Treating RN: Curtis Sites Primary Care Provider: Leotis Shames Other Clinician: Referring Provider: Leotis Shames Treating Provider/Extender: STONE III, HOYT Weeks in Treatment: 12 Information Obtained From Patient Wound History Do you currently have one or more open woundso Yes How many open wounds do you currently haveo 1 Approximately how long have you had your woundso nov 2018 How have you been treating your wound(s) until nowo band-aide, neo sporinHas your wound(s) ever healed and then re-openedo No Have you had any lab work done in the past montho No Have you tested positive for an antibiotic resistant organism  (MRSA, VRE)o No Have you tested positive for osteomyelitis (bone infection)o No Have you had any tests for circulation on your legso No Have you had other problems associated with your woundso Swelling Constitutional Symptoms (General Health) Complaints and Symptoms: Negative for: Fever; Chills Cardiovascular Complaints and Symptoms: Positive for: LE edema Medical History: Positive for: Hypertension Eyes Medical History: Positive for: Cataracts - surgery Hematologic/Lymphatic Medical History: Positive for: Anemia; Lymphedema Respiratory Complaints and Symptoms: No Complaints or Symptoms Endocrine Medical History: Positive for: Type II Diabetes Time with diabetes: 15 yrs Treated with: Insulin, Oral agents PASHA, BROAD ([id number]) Blood sugar tested every day: Yes Tested : Psychiatric Complaints and Symptoms: No Complaints or Symptoms HBO Extended History Items Eyes: Cataracts Immunizations Pneumococcal Vaccine: Received Pneumococcal Vaccination: Yes Implantable Devices Family and Social History Cancer: Yes - Father; Diabetes: No; Heart Disease: Yes - Father; Hereditary Spherocytosis: No; Hypertension: No; Kidney Disease: No; Lung Disease: No; Seizures: No; Stroke: No; Thyroid Problems: No; Tuberculosis: No; Never smoker; Marital Status - Married; Alcohol Use: Never; Drug Use: No History; Caffeine Use: Daily; Financial Concerns: No; Food, Clothing or Shelter Needs: No; Support System Lacking: No; Transportation Concerns: No; Advanced Directives: No; Patient does not want information on Advanced Directives; Do not resuscitate: No; Living Will: Yes (Copy provided); Medical Power of Attorney: No Physician Affirmation I have reviewed and agree with the above information. Electronic Signature(s) Signed: 12/03/2017 1:59:57 PM By: Lenda Kelp PA-C Signed: 12/03/2017 4:54:17 PM By: Curtis Sites Entered By: Lenda Kelp on 12/03/2017 09:51:36 Heideman, [name]  ([id number]) -------------------------------------------------------------------------------- SuperBill Details Patient Name: [name], [name]. Date of Service: 12/03/2017 Medical Record Number: [id number] Patient Account Number: [id number] Date of Birth/Sex: 1950-12-25 (66 y.o. 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 I89.0 Lymphedema, not elsewhere classified G70.00 Myasthenia gravis without (acute) exacerbation I87.2 Venous insufficiency (chronic) (peripheral) I73.9 Peripheral vascular disease, unspecified Facility Procedures CPT4 Code Description: [id number] - CHEM CAUT GRANULATION TISS ICD-10 Diagnosis Description 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 of other part of left lower leg with Modifier: fat layer expos Quantity: 1 ed Physician Procedures CPT4 Code Description: [id number] 17250 - WC PHYS CHEM  CAUT GRAN TISSUE ICD-10 Diagnosis Description 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 of other part of left lower leg with Modifier: fat layer expos Quantity: 1 ed Electronic Signature(s) Signed: 12/03/2017 1:59:57 PM By: Lenda Kelp PA-C Entered By: Lenda Kelp on 12/03/2017 09:53:07

## 2017-12-10 ENCOUNTER — Encounter: Payer: BLUE CROSS/BLUE SHIELD | Admitting: Physician Assistant

## 2017-12-10 DIAGNOSIS — E11622 Type 2 diabetes mellitus with other skin ulcer: Secondary | ICD-10-CM | POA: Diagnosis not present

## 2017-12-12 NOTE — Progress Notes (Signed)
[name], Douglas L. ([id number]) Visit Report for 12/10/2017 Arrival Information Details Patient Name: [name], Douglas L. Date of Service: 12/10/2017 9:15 AM Medical Record Number: [id number] Patient Account Number: [id number] Date of Birth/Sex: March 12, 1951 (66 y.o. M) Treating RN: Rema JasmineNg, Wendi Primary Care Yevonne Yokum: Leotis ShamesSINGH, JASMINE Other Clinician: Referring Rahaf Carbonell: Leotis ShamesSINGH, JASMINE Treating Mikhaila Roh/Extender: Linwood DibblesSTONE III, HOYT Weeks in Treatment: 13 Visit Information History Since Last Visit Added or deleted any medications: No Patient Arrived: Ambulatory Any new allergies or adverse reactions: No Arrival Time: 09:14 Had a fall or experienced change in No Accompanied By: self activities of daily living that may affect Transfer Assistance: None risk of falls: Patient Identification Verified: Yes Signs or symptoms of abuse/neglect since last visito No Secondary Verification Process Completed: Yes Hospitalized since last visit: No Patient Requires Transmission-Based No Implantable device outside of the clinic excluding No Precautions: cellular tissue based products placed in the center Patient Has Alerts: Yes since last visit: Has Dressing in Place as Prescribed: Yes Pain Present Now: No Electronic Signature(s) Signed: 12/10/2017 11:54:58 AM By: Rema JasmineNg, Wendi Entered By: Rema JasmineNg, Wendi on 12/10/2017 09:15:56 [name], [name] -------------------------------------------------------------------------------- Encounter Discharge Information Details Patient Name: [name], Douglas L. Date of Service: 12/10/2017 9:15 AM Medical Record Number: [id number] Patient Account Number: [id number] Date of Birth/Sex: March 12, 1951 (66 y.o. M) Treating RN: Curtis Sitesorthy, Joanna Primary Care Jordani Nunn: Leotis ShamesSINGH, JASMINE Other Clinician: Referring Veronica Guerrant: Leotis ShamesSINGH, JASMINE Treating Tamarra Geiselman/Extender: Linwood DibblesSTONE III, HOYT Weeks in Treatment: 13 Encounter Discharge Information Items Discharge Condition: Stable Ambulatory  Status: Ambulatory Discharge Destination: Home Transportation: Private Auto Accompanied By: self Schedule Follow-up Appointment: Yes Clinical Summary of Care: Post Procedure Vitals: Temperature (F): 97.8 Pulse (bpm): 63 Respiratory Rate (breaths/min): 16 Blood Pressure (mmHg): 140/69 Electronic Signature(s) Signed: 12/10/2017 5:04:25 PM By: Curtis Sitesorthy, Joanna Entered By: Curtis Sitesorthy, Joanna on 12/10/2017 09:46:47 [name], [name] -------------------------------------------------------------------------------- Lower Extremity Assessment Details Patient Name: [name], Douglas L. Date of Service: 12/10/2017 9:15 AM Medical Record Number: [id number] Patient Account Number: [id number] Date of Birth/Sex: March 12, 1951 (66 y.o. M) Treating RN: Rema JasmineNg, Wendi Primary Care Zabian Swayne: Leotis ShamesSINGH, JASMINE Other Clinician: Referring Raigen Jagielski: Leotis ShamesSINGH, JASMINE Treating Aurorah Schlachter/Extender: STONE III, HOYT Weeks in Treatment: 13 Edema Assessment Assessed: [Left: No] [Right: No] [Left: Edema] [Right: :] Calf Left: Right: Point of Measurement: 35 cm From Medial Instep 40.5 cm cm Ankle Left: Right: Point of Measurement: 14 cm From Medial Instep 21.5 cm cm Vascular Assessment Claudication: Claudication Assessment [Left:None] Pulses: Dorsalis Pedis Palpable: [Left:Yes] Posterior Tibial Extremity colors, hair growth, and conditions: Extremity Color: [Left:Normal] Hair Growth on Extremity: [Left:No] Temperature of Extremity: [Left:Warm] Capillary Refill: [Left:< 3 seconds] Toe Nail Assessment Left: Right: Thick: No Discolored: No Deformed: No Improper Length and Hygiene: No Electronic Signature(s) Signed: 12/10/2017 11:54:58 AM By: Rema JasmineNg, Wendi Entered By: Rema JasmineNg, Wendi on 12/10/2017 09:28:59 [name], [name] -------------------------------------------------------------------------------- Multi Wound Chart Details Patient Name: [name], Douglas L. Date of Service: 12/10/2017 9:15  AM Medical Record Number: [id number] Patient Account Number: [id number] Date of Birth/Sex: March 12, 1951 (66 y.o. M) Treating RN: Curtis Sitesorthy, Joanna Primary Care Sinclair Alligood: Leotis ShamesSINGH, JASMINE Other Clinician: Referring Delson Dulworth: Leotis ShamesSINGH, JASMINE Treating Lennox Dolberry/Extender: STONE III, HOYT Weeks in Treatment: 13 Vital Signs Height(in): 72 Pulse(bpm): 63 Weight(lbs): 323.9 Blood Pressure(mmHg): 140/69 Body Mass Index(BMI): 44 Temperature(F): 97.863 Respiratory Rate 16 (breaths/min): Photos: [N/A:N/A] Wound Location: Left Lower Leg - Lateral N/A N/A Wounding Event: Gradually Appeared N/A N/A Primary Etiology: Diabetic Wound/Ulcer of the N/A N/A Lower Extremity Comorbid History: Cataracts, Anemia, N/A N/A Lymphedema, Hypertension, Type II Diabetes Date Acquired: 08/26/2017 N/A N/A Weeks of  Treatment: 13 N/A N/A Wound Status: Open N/A N/A Measurements L x W x D 0.1x0.1x0.1 N/A N/A (cm) Area (cm) : 0.008 N/A N/A Volume (cm) : 0.001 N/A N/A % Reduction in Area: 99.70% N/A N/A % Reduction in Volume: 99.70% N/A N/A Classification: Grade 1 N/A N/A Exudate Amount: None Present N/A N/A Wound Margin: Flat and Intact N/A N/A Granulation Amount: None Present (0%) N/A N/A Necrotic Amount: Small (1-33%) N/A N/A Necrotic Tissue: Eschar N/A N/A Exposed Structures: Fat Layer (Subcutaneous N/A N/A Tissue) Exposed: Yes Fascia: No Tendon: No Muscle: No Joint: No Bone: No [name], [name] Epithelialization: Medium (34-66%) N/A N/A Periwound Skin Texture: Excoriation: No N/A N/A Induration: No Callus: No Crepitus: No Rash: No Scarring: No Periwound Skin Moisture: Maceration: No N/A N/A Dry/Scaly: No Periwound Skin Color: Erythema: Yes N/A N/A Atrophie Blanche: No Cyanosis: No Ecchymosis: No Hemosiderin Staining: No Mottled: No Pallor: No Rubor: No Erythema Location: Circumferential N/A N/A Temperature: No Abnormality N/A N/A Tenderness on Palpation: Yes N/A N/A Wound  Preparation: Ulcer Cleansing: N/A N/A Rinsed/Irrigated with Saline, Other: soap and water Topical Anesthetic Applied: None Treatment Notes Electronic Signature(s) Signed: 12/10/2017 5:04:25 PM By: Curtis Sites Entered By: Curtis Sites on 12/10/2017 09:34:02 Douglas Loll ([id number]) -------------------------------------------------------------------------------- Multi-Disciplinary Care Plan Details Patient Name: [name], Douglas L. Date of Service: 12/10/2017 9:15 AM Medical Record Number: [id number] Patient Account Number: [id number] Date of Birth/Sex: Oct 05, 1950 (66 y.o. M) Treating RN: Curtis Sites Primary Care Lakara Weiland: Leotis Shames Other Clinician: Referring Dayveon Halley: Leotis Shames Treating Cherokee Boccio/Extender: STONE III, HOYT Weeks in Treatment: 13 Active Inactive ` Orientation to the Wound Care Program Nursing Diagnoses: Knowledge deficit related to the wound healing center program Goals: Patient/caregiver will verbalize understanding of the Wound Healing Center Program Date Initiated: 09/09/2017 Target Resolution Date: 09/30/2017 Goal Status: Active Interventions: Provide education on orientation to the wound center Notes: ` Wound/Skin Impairment Nursing Diagnoses: Impaired tissue integrity Goals: Patient/caregiver will verbalize understanding of skin care regimen Date Initiated: 09/09/2017 Target Resolution Date: 09/28/2017 Goal Status: Active Ulcer/skin breakdown will have a volume reduction of 30% by week 4 Date Initiated: 09/09/2017 Target Resolution Date: 09/28/2017 Goal Status: Active Interventions: Assess patient/caregiver ability to obtain necessary supplies Assess patient/caregiver ability to perform ulcer/skin care regimen upon admission and as needed Assess ulceration(s) every visit Treatment Activities: Skin care regimen initiated : 09/09/2017 Notes: Electronic Signature(s) Signed: 12/10/2017 5:04:25 PM By: Milus Height, [name] Lango  ([id number]) Entered By: Curtis Sites on 12/10/2017 09:33:52 Gladden, [name] Lango ([id number]) -------------------------------------------------------------------------------- Pain Assessment Details Patient Name: [name], Douglas L. Date of Service: 12/10/2017 9:15 AM Medical Record Number: [id number] Patient Account Number: [id number] Date of Birth/Sex: 1950/05/30 (66 y.o. M) Treating RN: Rema Jasmine Primary Care Cinderella Christoffersen: Leotis Shames Other Clinician: Referring Makaia Rappa: Leotis Shames Treating Arvell Pulsifer/Extender: STONE III, HOYT Weeks in Treatment: 13 Active Problems Location of Pain Severity and Description of Pain Patient Has Paino No Site Locations Pain Management and Medication Current Pain Management: Goals for Pain Management pt denies any pain at this time. Electronic Signature(s) Signed: 12/10/2017 11:54:58 AM By: Rema Jasmine Entered By: Rema Jasmine on 12/10/2017 09:16:13 Douglas Loll ([id number]) -------------------------------------------------------------------------------- Patient/Caregiver Education Details Patient Name: [name], ZEA. Date of Service: 12/10/2017 9:15 AM Medical Record Number: [id number] Patient Account Number: [id number] Date of Birth/Gender: 04-20-1950 (66 y.o. M) Treating RN: Curtis Sites Primary Care Physician: Leotis Shames Other Clinician: Referring Physician: Leotis Shames Treating Physician/Extender: Skeet Simmer in Treatment: 13 Education Assessment Education Provided To: Patient Education Topics Provided  Wound/Skin Impairment: Handouts: Other: wound care as ordered Methods: Demonstration, Explain/Verbal Responses: State content correctly Electronic Signature(s) Signed: 12/10/2017 5:04:25 PM By: Curtis Sites Entered By: Curtis Sites on 12/10/2017 09:47:04 [name], [name] Lango ([id number]) -------------------------------------------------------------------------------- Wound Assessment Details Patient Name: [name], Douglas L. Date of Service: 12/10/2017 9:15 AM Medical Record Number: [id number] Patient Account Number: [id number] Date of Birth/Sex: 1951-02-02 (66 y.o. M) Treating RN: Rema Jasmine Primary Care Eboney Claybrook: Leotis Shames Other Clinician: Referring Kiari Hosmer: Leotis Shames Treating Kamylah Manzo/Extender: STONE III, HOYT Weeks in Treatment: 13 Wound Status Wound Number: 1 Primary Diabetic Wound/Ulcer of the Lower Extremity Etiology: Wound Location: Left Lower Leg - Lateral Wound Status: Open Wounding Event: Gradually Appeared Comorbid Cataracts, Anemia, Lymphedema, Date Acquired: 08/26/2017 History: Hypertension, Type II Diabetes Weeks Of Treatment: 13 Clustered Wound: No Photos Photo Uploaded By: Rema Jasmine on 12/10/2017 09:32:59 Wound Measurements Length: (cm) 0.1 Width: (cm) 0.1 Depth: (cm) 0.1 Area: (cm) 0.008 Volume: (cm) 0.001 % Reduction in Area: 99.7% % Reduction in Volume: 99.7% Epithelialization: Medium (34-66%) Wound Description Classification: Grade 1 Foul Odo Wound Margin: Flat and Intact Slough/F Exudate Amount: None Present r After Cleansing: No ibrino No Wound Bed Granulation Amount: None Present (0%) Exposed Structure Necrotic Amount: Small (1-33%) Fascia Exposed: No Necrotic Quality: Eschar Fat Layer (Subcutaneous Tissue) Exposed: Yes Tendon Exposed: No Muscle Exposed: No Joint Exposed: No Bone Exposed: No Periwound Skin Texture Texture Color No Abnormalities Noted: No No Abnormalities Noted: No KALUM, MINNER. ([id number]) Callus: No Atrophie Blanche: No Crepitus: No Cyanosis: No Excoriation: No Ecchymosis: No Induration: No Erythema: Yes Rash: No Erythema Location: Circumferential Scarring: No Hemosiderin Staining: No Mottled: No Moisture Pallor: No No Abnormalities Noted: No Rubor: No Dry / Scaly: No Maceration: No Temperature / Pain Temperature: No Abnormality Tenderness on Palpation: Yes Wound Preparation Ulcer  Cleansing: Rinsed/Irrigated with Saline, Other: soap and water, Topical Anesthetic Applied: None Treatment Notes Wound #1 (Left, Lateral Lower Leg) 1. Cleansed with: Clean wound with Normal Saline Cleanse wound with antibacterial soap and water 2. Anesthetic Topical Lidocaine 4% cream to wound bed prior to debridement 3. Peri-wound Care: Skin Prep 4. Dressing Applied: Prisma Ag 5. Secondary Dressing Applied [place] 7. Secured with Patient to wear own compression stockings Electronic Signature(s) Signed: 12/10/2017 11:54:58 AM By: Rema Jasmine Entered By: Rema Jasmine on 12/10/2017 09:27:02 Douglas Loll ([id number]) -------------------------------------------------------------------------------- Vitals Details Patient Name: KRISHIV, SANDLER L. Date of Service: 12/10/2017 9:15 AM Medical Record Number: [id number] Patient Account Number: [id number] Date of Birth/Sex: May 25, 1950 (66 y.o. M) Treating RN: Rema Jasmine Primary Care Henli Hey: Leotis Shames Other Clinician: Referring Julyan Gales: Leotis Shames Treating Miron Marxen/Extender: STONE III, HOYT Weeks in Treatment: 13 Vital Signs Time Taken: 09:16 Temperature (F): 97.863 Height (in): 72 Pulse (bpm): 63 Weight (lbs): 323.9 Respiratory Rate (breaths/min): 16 Body Mass Index (BMI): 43.9 Blood Pressure (mmHg): 140/69 Reference Range: 80 - 120 mg / dl Electronic Signature(s) Signed: 12/10/2017 11:54:58 AM By: Rema Jasmine Entered ByRema Jasmine on 12/10/2017 09:18:29

## 2017-12-12 NOTE — Progress Notes (Signed)
[name] ([id number]) Visit Report for 12/10/2017 Chief Complaint Document Details Patient Name: [name], [name]. Date of Service: 12/10/2017 9:15 AM Medical Record Number: [id number] Patient Account Number: [id number] Date of Birth/Sex: 1951-03-08 (67 y.o. M) Treating RN: Curtis Sites Primary Care Provider: Leotis Shames Other Clinician: Referring Provider: Leotis Shames Treating Provider/Extender: Linwood Dibbles, HOYT Weeks in Treatment: 13 Information Obtained from: Patient Chief Complaint Left leg ulcer Electronic Signature(s) Signed: 12/10/2017 4:58:18 PM By: Lenda Kelp PA-C Entered By: Lenda Kelp on 12/10/2017 09:20:10 [name] ([id number]) -------------------------------------------------------------------------------- Debridement Details Patient Name: [name], [name] L. Date of Service: 12/10/2017 9:15 AM Medical Record Number: [id number] Patient Account Number: [id number] Date of Birth/Sex: [birth year] (67 y.o. M) Treating RN: Curtis Sites Primary Care Provider: Leotis Shames Other Clinician: Referring Provider: Leotis Shames Treating Provider/Extender: STONE III, HOYT Weeks in Treatment: 13 Debridement Performed for Wound #1 Left,Lateral Lower Leg Assessment: Performed By: Physician STONE III, HOYT E., PA-C Debridement Type: Debridement Severity of Tissue Pre Fat layer exposed Debridement: Level of Consciousness (Pre- Awake and Alert procedure): Pre-procedure Verification/Time Yes - 09:32 Out Taken: Start Time: 09:32 Pain Control: Lidocaine 4% Topical Solution Total Area Debrided (L x W): 0.1 (cm) x 0.1 (cm) = 0.01 (cm) Tissue and other material Viable, Non-Viable, Slough, Subcutaneous, Fibrin/Exudate, Slough debrided: Level: Skin/Subcutaneous Tissue Debridement Description: Excisional Instrument: Curette Bleeding: Minimum Hemostasis Achieved: Pressure End Time: 09:35 Procedural Pain: 0 Post Procedural Pain: 0 Response to  Treatment: Procedure was tolerated well Level of Consciousness Awake and Alert (Post-procedure): Post Debridement Measurements of Total Wound Length: (cm) 0.5 Width: (cm) 0.5 Depth: (cm) 0.1 Volume: (cm) 0.02 Character of Wound/Ulcer Post Debridement: Improved Severity of Tissue Post Debridement: Fat layer exposed Post Procedure Diagnosis Same as Pre-procedure Electronic Signature(s) Signed: 12/10/2017 4:58:18 PM By: Lenda Kelp PA-C Signed: 12/10/2017 5:04:25 PM By: Curtis Sites Entered By: Curtis Sites on 12/10/2017 09:36:06 [name] ([id number]) -------------------------------------------------------------------------------- HPI Details Patient Name: [name], [name] L. Date of Service: 12/10/2017 9:15 AM Medical Record Number: [id number] Patient Account Number: [id number] Date of Birth/Sex: [birth year] (67 y.o. M) Treating RN: Curtis Sites Primary Care Provider: Leotis Shames Other Clinician: Referring Provider: Leotis Shames Treating Provider/Extender: STONE III, HOYT Weeks in Treatment: 13 History of Present Illness HPI Description: MYASTHENIA GRAVIS (MG) MEDICATION ALERT: Medications that increase weakness in most MG patients should be avoided or used only with great caution. These include: - Fluoroquinolone antibiotics: moxifloxacin (Aveloxo), ciprofloxacin (Ciproo, Proquin XRo), norfloxacin (Noroxino), levofloxacin (Levaquino), ofloxacin (Floxino), gemifloxacin (Factiveo) - Aminoglycoside antibiotics: tobramycin, gentamycin, kanamycin, neomycin, streptomycin - Macrolide antibiotics: erythromycin, azithromycin (Z-Pako, Zithromaxo), telithromycin (Keteko) - Botulinum toxins: Botoxo, Myobloco, Dysporto, Xeomino - Beta-blockers such as propanolol or timolol eyedrops (Timoptico) - Calcium channel blockers (blood pressure medications) - Quinine, quinidine or procainamide - D-penicillamine (do not confuse with penicillin) - Interferon - Magnesium salts:  milk of magnesia, some antacids (Maaloxo, Mylantao) - Curare and related drugs (usually used only during surgery) Other medications may produce problems in some MG patients. Be sure your doctor/dentist knows you have MG when any new medication is prescribed. Contact your myasthenia doctor if you or your local doctor has questions about medications. 09/09/17 on evaluation today patient actually appears for initial evaluation today concerning his left lateral lower extremity ulcer which has been present he tells me since November 2018. He states that gas logs that he'd forgotten he had said somewhere actually fell over landing on his leg causing the injury and unfortunately the left leg has never fully recovered  since that time. He does note that there was some improvement initially and now it's pretty much stagnant. He does have venous stasis chronically although he states it's been worse and pass compared to current. He has also been diagnosed with lymphedema although again this is something else that has been deemed the worst in the past compared to present. He does have lymphedema pumps although he really has not been using those on a regular basis. Patient does have diabetes mellitus type II in other than the venous insufficiency and prayerful vascular disease he also has myasthenia gravis board she is on medications long-term. Currently he has been tolerating the medications for this very well he does have a list as noted above in the HPI of medications that he is to avoid that we need to keep in mind. Nonetheless the patient seems to be doing fairly well in general. I do believe his peripheral edema may be contributing to the nonhealing factor in regard to this ulcer. 09/16/17 on evaluation today patient appears to be doing rather well his wound actually appears to show signs of improvement compared to previous evaluation which is great news. Overall his wound is a little larger but again I did  debride the wound sharply last week so this is definitely expected as far as I was concerned. Nonetheless the wound bed itself seems to be doing excellent. He is having no discomfort normally he does have a little bit of pain with cleansing of the wound by myself today. 09/23/17 on evaluation today patient actually appears to be showing signs of good improvement in regard to his left lateral motion the ulcer. He's been tolerating the dressing changes without complication. Fortunately there does not appear to be any evidence of infection at this point which is great news. Overall I'm extremely happy that he seems to be doing so well and the patient likewise wants to be healed as soon as possible he actually has a pull at home he would love to be in but he hasn't been able to get into this due to what's going on currently. 10/01/16 on evaluation today patient appears to be doing rather well in regard to his left lateral lower Trinity ulcer. He's been tolerating the dressing changes without complication. There does not appear to be any evidence of infection at this time. Overall I'm very pleased with the progress that has been made up to this point. I do believe after today will likely be able to switch to a different dressing. [name], [name] ([id number]) 10/08/17 on evaluation today patient actually appears to be doing very well at this point in time. His wound is showing signs of progress which is good news I am not even having to debride as much away as far as the slough is concerned. He does have some Slough which requires debridement at this point however. No fevers, chills, nausea, or vomiting noted at this time. 10/15/17 on evaluation today patient presents for follow-up concerning his ongoing lower extremity ulcer of the left lower extremity. He has been tolerating the dressing changes without complication. With that being said he tells me at this point that his wound has not really been causing  much in the way of discomfort. The wound itself seems to be making good progress with a good granular bed now at this point he did have some Slough on the surface of the wound this was minimal. He has good epithelialization from the anterior tourist posterior portion of the wound.  10/22/17 on evaluation today patient appears to be doing very well in regard to his left lateral lower extremity ulcer. He is shown signs of improvement which is good news. He's been tolerating the dressing changes without complication. In general I think he has done well with the wraps and though he has not made as much progress this week is he has in weeks past I still feel he's making good progress. We will see how things appear in one weeks time. 10/29/17-He is here in follow-up evaluation for a left lower extremity ulcer. He is tolerating dressing changes and voicing no complaints or concerns. We will continue with the same treatment plan, he will return for nurse visit on Monday and follow-up in 2 weeks. 11/12/17 on evaluation today patient's left lateral lower Trinity ulcer appears to be doing very well at this point. He's been tolerating the dressing changes without complication. Fortunately his wounds have been making great progress and specifically the remaining wound has shown signs of new epithelialization and granulation week by week. Nonetheless he still does have a little bit of work to do as far as getting this to completely heal. No fevers, chills, nausea, or vomiting noted at this time. 11/19/17 on evaluation today patient appears to be doing fairly well at this point in regard to the left lateral lower Trinity ulcer. He has had some improvement since his last visit in the office which is good news. Overall I'm pleased with the progress there's no evidence of infection in general I think things are making great improvement. 12/03/17 on evaluation today patient actually appears to be doing better in regard to  his left lower extremity ulcer. He's been tolerating the dressing changes without complication. With that being said he does seem to be making good progress at this time. Fortunately there is no evidence of infection. He does have some hyper granular tissue which I think has slightly slowed down his progress. Nonetheless I think that this is a very minor setback and I believe that his wound will likely be healed in the next 1-2 weeks. 12/10/17 on evaluation today patient actually appears to be doing much better in regard to the posterior portion of the wound which is filled in quite nicely. That was the deepest area that has been most concerned with their she's taken point opening still remaining. Nonetheless on the main portion of the wound there is a significant amount of dressing material and scab still overlying the wound bed that does not really allow me to see how things are really doing. This had to be removed today. Nonetheless overall there's no evidence of infection but no matter which dressing use this seems to be getting stuck in causing more problems than helping unfortunately. Electronic Signature(s) Signed: 12/10/2017 4:58:18 PM By: Lenda Kelp PA-C Entered By: Lenda Kelp on 12/10/2017 09:42:11 Hartmann, [name] ([id number]) -------------------------------------------------------------------------------- Physical Exam Details Patient Name: [name], [name]. Date of Service: 12/10/2017 9:15 AM Medical Record Number: [id number] Patient Account Number: [id number] Date of Birth/Sex: Aug 17, 1950 (67 y.o. M) Treating RN: Curtis Sites Primary Care Provider: Leotis Shames Other Clinician: Referring Provider: Leotis Shames Treating Provider/Extender: STONE III, HOYT Weeks in Treatment: 13 Constitutional Well-nourished and well-hydrated in no acute distress. Respiratory normal breathing without difficulty. Psychiatric this patient is able to make decisions and  demonstrates good insight into disease process. Alert and Oriented x 3. pleasant and cooperative. Notes At this point I discussed with the patient that we did need to perform  sharp debridement to remove the dressing material and eschar from the surface of the wound. He tolerated this without complication. With that being said my suggestion is gonna be that we go back to utilize the Brutus but have him change this every two days not wrapping his leg in order to allow this to hopefully heal more rapidly. I think the dressing material not sticking and staying for such a long period of time will actually be of benefit for him. Post debridement the wound bed appears to be doing well although there still some work to do. Electronic Signature(s) Signed: 12/10/2017 4:58:18 PM By: Lenda Kelp PA-C Entered By: Lenda Kelp on 12/10/2017 09:42:56 Saab, [name] ([id number]) -------------------------------------------------------------------------------- Physician Orders Details Patient Name: [name], [name]. Date of Service: 12/10/2017 9:15 AM Medical Record Number: [id number] Patient Account Number: [id number] Date of Birth/Sex: 10-12-1950 (67 y.o. M) Treating RN: Curtis Sites Primary Care Provider: Leotis Shames Other Clinician: Referring Provider: Leotis Shames Treating Provider/Extender: Linwood Dibbles, HOYT Weeks in Treatment: 13 Verbal / Phone Orders: No Diagnosis Coding ICD-10 Coding Code Description [phone] Non-pressure chronic ulcer of other part of left lower leg with fat layer exposed E11.622 Type 2 diabetes mellitus with other skin ulcer I89.0 Lymphedema, not elsewhere classified G70.00 Myasthenia gravis without (acute) exacerbation I87.2 Venous insufficiency (chronic) (peripheral) I73.9 Peripheral vascular disease, unspecified Wound Cleansing Wound #1 Left,Lateral Lower Leg o Clean wound with Normal Saline. o Cleanse wound with mild soap and water Anesthetic (add to  Medication List) Wound #1 Left,Lateral Lower Leg o Topical Lidocaine 4% cream applied to wound bed prior to debridement (In Clinic Only). Skin Barriers/Peri-Wound Care Wound #1 Left,Lateral Lower Leg o Moisturizing lotion Primary Wound Dressing Wound #1 Left,Lateral Lower Leg o Silver Collagen Secondary Dressing Wound #1 Left,Lateral Lower Leg o Boardered Foam Dressing Dressing Change Frequency Wound #1 Left,Lateral Lower Leg o Change dressing every other day. Follow-up Appointments Wound #1 Left,Lateral Lower Leg o Return Appointment in 1 week. Edema Control Wound #1 Left,Lateral Lower Leg LLEYTON, BYERS. ([id number]) o Patient to wear own compression stockings o Elevate legs to the level of the heart and pump ankles as often as possible o Compression Pump: Use compression pump on left lower extremity for 30 minutes, twice daily. - work up to 1 hour twice daily o Compression Pump: Use compression pump on right lower extremity for 30 minutes, twice daily. - work up to 1 hour twice daily Electronic Signature(s) Signed: 12/10/2017 4:58:18 PM By: Lenda Kelp PA-C Signed: 12/10/2017 5:04:25 PM By: Curtis Sites Entered By: Curtis Sites on 12/10/2017 09:37:52 Burchfield, [name] ([id number]) -------------------------------------------------------------------------------- Problem List Details Patient Name: [name], [name]. Date of Service: 12/10/2017 9:15 AM Medical Record Number: [id number] Patient Account Number: [id number] Date of Birth/Sex: June 08, 1950 (67 y.o. M) Treating RN: Curtis Sites Primary Care Provider: Leotis Shames Other Clinician: Referring Provider: Leotis Shames Treating Provider/Extender: Linwood Dibbles, HOYT Weeks in Treatment: 13 Active Problems ICD-10 Evaluated Encounter Code Description Active Date Today Diagnosis 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 of other part of left lower leg with 09/09/2017 No Yes fat layer exposed E11.622  Type 2 diabetes mellitus with other skin ulcer 09/09/2017 No Yes I89.0 Lymphedema, not elsewhere classified 09/09/2017 No Yes G70.00 Myasthenia gravis without (acute) exacerbation 09/09/2017 No Yes I87.2 Venous insufficiency (chronic) (peripheral) 09/09/2017 No Yes I73.9 Peripheral vascular disease, unspecified 09/09/2017 No Yes Inactive Problems Resolved Problems Electronic Signature(s) Signed: 12/10/2017 4:58:18 PM By: Lenda Kelp PA-C Entered By: Lenda Kelp on 12/10/2017 09:20:04  [name], [name] ([id number]) -------------------------------------------------------------------------------- Progress Note Details Patient Name: [name], [name]. Date of Service: 12/10/2017 9:15 AM Medical Record Number: [id number] Patient Account Number: [id number] Date of Birth/Sex: 1951/02/06 (67 y.o. M) Treating RN: Curtis Sites Primary Care Provider: Leotis Shames Other Clinician: Referring Provider: Leotis Shames Treating Provider/Extender: STONE III, HOYT Weeks in Treatment: 13 Subjective Chief Complaint Information obtained from Patient Left leg ulcer History of Present Illness (HPI) MYASTHENIA GRAVIS (MG) MEDICATION ALERT: Medications that increase weakness in most MG patients should be avoided or used only with great caution. These include: - Fluoroquinolone antibiotics: moxifloxacin (Avelox), ciprofloxacin (Cipro, Proquin XR), norfloxacin (Noroxin), levofloxacin (Levaquin), ofloxacin (Floxin), gemifloxacin (Factive) - Aminoglycoside antibiotics: tobramycin, gentamycin, kanamycin, neomycin, streptomycin - Macrolide antibiotics: erythromycin, azithromycin (Z-Pak, Zithromax), telithromycin (Ketek) - Botulinum toxins: Botox, Myobloc, Dysport, Xeomin - Beta-blockers such as propanolol or timolol eyedrops (Timoptic) - Calcium channel blockers (blood pressure medications) - Quinine, quinidine or procainamide - D-penicillamine (do not confuse with  penicillin) - Interferon - Magnesium salts: milk of magnesia, some antacids (Maalox, Mylanta) - Curare and related drugs (usually used only during surgery) Other medications may produce problems in some MG patients. Be sure your doctor/dentist knows you have MG when any new medication is prescribed. Contact your myasthenia doctor if you or your local doctor has questions about medications. 09/09/17 on evaluation today patient actually appears for initial evaluation today concerning his left lateral lower extremity ulcer which has been present he tells me since November 2018. He states that gas logs that he'd forgotten he had said somewhere actually fell over landing on his leg causing the injury and unfortunately the left leg has never fully recovered since that time. He does note that there was some improvement initially and now it's pretty much stagnant. He does have venous stasis chronically although he states it's been worse and pass compared to current. He has also been diagnosed with lymphedema although again this is something else that has been deemed the worst in the past compared to present. He does have lymphedema pumps although he really has not been using those on a regular basis. Patient does have diabetes mellitus type II in other than the venous insufficiency and prayerful vascular disease he also has myasthenia gravis board she is on medications long-term. Currently he has been tolerating the medications for this very well he does have a list as noted above in the HPI of medications that he is to avoid that we need to keep in mind. Nonetheless the patient seems to be doing fairly well in general. I do believe his peripheral edema may be contributing to the nonhealing factor in regard to this ulcer. 09/16/17 on evaluation today patient appears to be doing rather well his wound actually appears to show signs of improvement compared to previous evaluation which is great news. Overall  his wound is a little larger but again I did debride the wound sharply last week so this is definitely expected as far as I was concerned. Nonetheless the wound bed itself seems to be doing excellent. He is having no discomfort normally he does have a little bit of pain with cleansing of the wound by myself today. 09/23/17 on evaluation today patient actually appears to be showing signs of good improvement in regard to his left lateral motion the ulcer. He's been tolerating the dressing changes without complication. Fortunately there does not appear to be any evidence of infection at this point which is great news. Overall I'm extremely happy that he seems  to be doing so well and the patient likewise wants to be healed as soon as possible he actually has a pull at home he would love to be in but he hasn't [name], [name]. ([id number]) been able to get into this due to what's going on currently. 10/01/16 on evaluation today patient appears to be doing rather well in regard to his left lateral lower Trinity ulcer. He's been tolerating the dressing changes without complication. There does not appear to be any evidence of infection at this time. Overall I'm very pleased with the progress that has been made up to this point. I do believe after today will likely be able to switch to a different dressing. 10/08/17 on evaluation today patient actually appears to be doing very well at this point in time. His wound is showing signs of progress which is good news I am not even having to debride as much away as far as the slough is concerned. He does have some Slough which requires debridement at this point however. No fevers, chills, nausea, or vomiting noted at this time. 10/15/17 on evaluation today patient presents for follow-up concerning his ongoing lower extremity ulcer of the left lower extremity. He has been tolerating the dressing changes without complication. With that being said he tells me at this  point that his wound has not really been causing much in the way of discomfort. The wound itself seems to be making good progress with a good granular bed now at this point he did have some Slough on the surface of the wound this was minimal. He has good epithelialization from the anterior tourist posterior portion of the wound. 10/22/17 on evaluation today patient appears to be doing very well in regard to his left lateral lower extremity ulcer. He is shown signs of improvement which is good news. He's been tolerating the dressing changes without complication. In general I think he has done well with the wraps and though he has not made as much progress this week is he has in weeks past I still feel he's making good progress. We will see how things appear in one weeks time. 10/29/17-He is here in follow-up evaluation for a left lower extremity ulcer. He is tolerating dressing changes and voicing no complaints or concerns. We will continue with the same treatment plan, he will return for nurse visit on Monday and follow-up in 2 weeks. 11/12/17 on evaluation today patient's left lateral lower Trinity ulcer appears to be doing very well at this point. He's been tolerating the dressing changes without complication. Fortunately his wounds have been making great progress and specifically the remaining wound has shown signs of new epithelialization and granulation week by week. Nonetheless he still does have a little bit of work to do as far as getting this to completely heal. No fevers, chills, nausea, or vomiting noted at this time. 11/19/17 on evaluation today patient appears to be doing fairly well at this point in regard to the left lateral lower Trinity ulcer. He has had some improvement since his last visit in the office which is good news. Overall I'm pleased with the progress there's no evidence of infection in general I think things are making great improvement. 12/03/17 on evaluation today patient  actually appears to be doing better in regard to his left lower extremity ulcer. He's been tolerating the dressing changes without complication. With that being said he does seem to be making good progress at this time. Fortunately there is no evidence  of infection. He does have some hyper granular tissue which I think has slightly slowed down his progress. Nonetheless I think that this is a very minor setback and I believe that his wound will likely be healed in the next 1-2 weeks. 12/10/17 on evaluation today patient actually appears to be doing much better in regard to the posterior portion of the wound which is filled in quite nicely. That was the deepest area that has been most concerned with their she's taken point opening still remaining. Nonetheless on the main portion of the wound there is a significant amount of dressing material and scab still overlying the wound bed that does not really allow me to see how things are really doing. This had to be removed today. Nonetheless overall there's no evidence of infection but no matter which dressing use this seems to be getting stuck in causing more problems than helping unfortunately. Patient History Information obtained from Patient. Family History Cancer - Father, Heart Disease - Father, No family history of Diabetes, Hereditary Spherocytosis, Hypertension, Kidney Disease, Lung Disease, Seizures, Stroke, Thyroid Problems, Tuberculosis. Social History Never smoker, Marital Status - Married, Alcohol Use - Never, Drug Use - No History, Caffeine Use - Daily. [name], [name] ([id number]) Review of Systems (ROS) Constitutional Symptoms (General Health) Denies complaints or symptoms of Fever, Chills. Respiratory The patient has no complaints or symptoms. Cardiovascular Complains or has symptoms of LE edema. Psychiatric The patient has no complaints or symptoms. Objective Constitutional Well-nourished and well-hydrated in no acute  distress. Vitals Time Taken: 9:16 AM, Height: 72 in, Weight: 323.9 lbs, BMI: 43.9, Temperature: 97.863 F, Pulse: 63 bpm, Respiratory Rate: 16 breaths/min, Blood Pressure: 140/69 mmHg. Respiratory normal breathing without difficulty. Psychiatric this patient is able to make decisions and demonstrates good insight into disease process. Alert and Oriented x 3. pleasant and cooperative. General Notes: At this point I discussed with the patient that we did need to perform sharp debridement to remove the dressing material and eschar from the surface of the wound. He tolerated this without complication. With that being said my suggestion is gonna be that we go back to utilize the [place] but have him change this every two days not wrapping his leg in order to allow this to hopefully heal more rapidly. I think the dressing material not sticking and staying for such a long period of time will actually be of benefit for him. Post debridement the wound bed appears to be doing well although there still some work to do. Integumentary (Hair, Skin) Wound #1 status is Open. Original cause of wound was Gradually Appeared. The wound is located on the Left,Lateral Lower Leg. The wound measures 0.1cm length x 0.1cm width x 0.1cm depth; 0.008cm^2 area and 0.001cm^3 volume. There is Fat Layer (Subcutaneous Tissue) Exposed exposed. There is a none present amount of drainage noted. The wound margin is flat and intact. There is no granulation within the wound bed. There is a small (1-33%) amount of necrotic tissue within the wound bed including Eschar. The periwound skin appearance exhibited: Erythema. The periwound skin appearance did not exhibit: Callus, Crepitus, Excoriation, Induration, Rash, Scarring, Dry/Scaly, Maceration, Atrophie Blanche, Cyanosis, Ecchymosis, Hemosiderin Staining, Mottled, Pallor, Rubor. The surrounding wound skin color is noted with erythema which is circumferential. Periwound temperature  was noted as No Abnormality. The periwound has tenderness on palpation. 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 There was a Excisional Skin/Subcutaneous Tissue Debridement with a total area of 0.01 sq cm performed by STONE III, HOYT E., PA-C. With the following instrument(s): Curette to remove Viable and Non-Viable tissue/material. Material removed includes Subcutaneous Tissue, Slough, and Fibrin/Exudate after achieving pain control using Lidocaine 4% Topical Solution. No specimens were taken. A time out was conducted at 09:32, prior to the start of the procedure. A Minimum amount of bleeding was controlled with Pressure. The procedure was tolerated well with a pain level of 0 throughout and a pain level of 0 following the procedure. Post Debridement Measurements: 0.5cm length x 0.5cm width x 0.1cm depth; 0.02cm^3 volume. Character of Wound/Ulcer Post Debridement is improved. Severity of Tissue Post Debridement is: Fat layer exposed. Post procedure Diagnosis Wound #1: Same as Pre-Procedure Plan Wound Cleansing: Wound #1 Left,Lateral Lower Leg: Clean wound with Normal Saline. Cleanse wound with mild soap and water Anesthetic (add to Medication List): Wound #1 Left,Lateral Lower Leg: Topical Lidocaine 4% cream applied to wound bed prior to debridement (In Clinic Only). Skin Barriers/Peri-Wound Care: Wound #1 Left,Lateral Lower Leg: Moisturizing lotion Primary Wound  Dressing: Wound #1 Left,Lateral Lower Leg: Silver Collagen Secondary Dressing: Wound #1 Left,Lateral Lower Leg: Boardered Foam Dressing Dressing Change Frequency: Wound #1 Left,Lateral Lower Leg: Change dressing every other day. Follow-up Appointments: Wound #1 Left,Lateral Lower Leg: Return Appointment in 1 week. Vassie LollHUNTER, Jade L. ([id number]) Edema Control: Wound #1 Left,Lateral Lower Leg: Patient to wear own compression stockings Elevate legs to the level of the heart and pump ankles as often as possible Compression Pump: Use compression pump on left lower extremity for 30 minutes, twice daily. - work up to 1 hour twice daily Compression Pump: Use compression pump on right lower extremity for 30 minutes, twice daily. - work up to 1 hour twice daily I'm gonna suggest currently that we discontinue the compression wrap he did bring his compression stocking which you will use over the dressing currently. We will subsequently see him for reevaluation see were things stand. Please see above for specific wound care orders. We will see patient for re-evaluation in 1 week(s) here in the clinic. If anything worsens or changes patient will contact our office for additional recommendations. Electronic Signature(s) Signed: 12/10/2017 4:58:18 PM By: Lenda KelpStone III, Hoyt PA-C Entered By: Lenda KelpStone III, Hoyt on 12/10/2017 09:43:30 Novitski, Douglas LangoICHARD L. ([id number]) -------------------------------------------------------------------------------- ROS/PFSH Details Patient Name: Vassie LollHUNTER, Kord L. Date of Service: 12/10/2017 9:15 AM Medical Record Number: [id number] Patient Account Number: [id number] Date of Birth/Sex: 1950/05/27 (67 y.o. M) Treating RN: Curtis Sitesorthy, Joanna Primary Care Provider: Leotis ShamesSINGH, JASMINE Other Clinician: Referring Provider: Leotis ShamesSINGH, JASMINE Treating Provider/Extender: STONE III, HOYT Weeks in Treatment: 13 Information Obtained From Patient Wound History Do you currently have one or more  open woundso Yes How many open wounds do you currently haveo 1 Approximately how long have you had your woundso nov 2018 How have you been treating your wound(s) until nowo band-aide, neo sporinHas your wound(s) ever healed and then re-openedo No Have you had any lab work done in the past montho No Have you tested positive for an antibiotic resistant organism (MRSA, VRE)o No Have you tested positive for osteomyelitis (bone infection)o No Have you  had any tests for circulation on your legso No Have you had other problems associated with your woundso Swelling Constitutional Symptoms (General Health) Complaints and Symptoms: Negative for: Fever; Chills Cardiovascular Complaints and Symptoms: Positive for: LE edema Medical History: Positive for: Hypertension Eyes Medical History: Positive for: Cataracts - surgery Hematologic/Lymphatic Medical History: Positive for: Anemia; Lymphedema Respiratory Complaints and Symptoms: No Complaints or Symptoms Endocrine Medical History: Positive for: Type II Diabetes Time with diabetes: 15 yrs Treated with: Insulin, Oral agents NASON, CONRADT ([id number]) Blood sugar tested every day: Yes Tested : Psychiatric Complaints and Symptoms: No Complaints or Symptoms HBO Extended History Items Eyes: Cataracts Immunizations Pneumococcal Vaccine: Received Pneumococcal Vaccination: Yes Implantable Devices Family and Social History Cancer: Yes - Father; Diabetes: No; Heart Disease: Yes - Father; Hereditary Spherocytosis: No; Hypertension: No; Kidney Disease: No; Lung Disease: No; Seizures: No; Stroke: No; Thyroid Problems: No; Tuberculosis: No; Never smoker; Marital Status - Married; Alcohol Use: Never; Drug Use: No History; Caffeine Use: Daily; Financial Concerns: No; Food, Clothing or Shelter Needs: No; Support System Lacking: No; Transportation Concerns: No; Advanced Directives: No; Patient does not want information on Advanced Directives;  Do not resuscitate: No; Living Will: Yes (Copy provided); Medical Power of Attorney: No Physician Affirmation I have reviewed and agree with the above information. Electronic Signature(s) Signed: 12/10/2017 4:58:18 PM By: Lenda Kelp PA-C Signed: 12/10/2017 5:04:25 PM By: Curtis Sites Entered By: Lenda Kelp on 12/10/2017 09:42:34 Fuerstenberg, [name] ([id number]) -------------------------------------------------------------------------------- SuperBill Details Patient Name: NELTON, AMSDEN. Date of Service: 12/10/2017 Medical Record Number: [id number] Patient Account Number: [id number] Date of Birth/Sex: [birth year] (67 y.o. M) Treating RN: Curtis Sites Primary Care Provider: Leotis Shames Other Clinician: Referring Provider: Leotis Shames Treating Provider/Extender: Linwood Dibbles, HOYT Weeks in Treatment: 13 Diagnosis Coding ICD-10 Codes Code Description [phone] Non-pressure chronic ulcer of other part of left lower leg with fat layer exposed E11.622 Type 2 diabetes mellitus with other skin ulcer I89.0 Lymphedema, not elsewhere classified G70.00 Myasthenia gravis without (acute) exacerbation I87.2 Venous insufficiency (chronic) (peripheral) I73.9 Peripheral vascular disease, unspecified Facility Procedures CPT4 Code Description: [id number] 11042 - DEB SUBQ TISSUE 20 SQ CM/< ICD-10 Diagnosis Description 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 of other part of left lower leg with Modifier: fat layer expos Quantity: 1 ed Physician Procedures CPT4 Code Description: [id number] 11042 - WC PHYS SUBQ TISS 20 SQ CM ICD-10 Diagnosis Description 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 of other part of left lower leg with Modifier: fat layer expos Quantity: 1 ed Electronic Signature(s) Signed: 12/10/2017 4:58:18 PM By: Lenda Kelp PA-C Entered By: Lenda Kelp on 12/10/2017 09:43:41

## 2017-12-17 ENCOUNTER — Encounter: Payer: BLUE CROSS/BLUE SHIELD | Admitting: Physician Assistant

## 2017-12-17 DIAGNOSIS — E11622 Type 2 diabetes mellitus with other skin ulcer: Secondary | ICD-10-CM | POA: Diagnosis not present

## 2017-12-19 NOTE — Progress Notes (Signed)
Douglas, Wright ([id number]) Visit Report for 12/17/2017 Chief Complaint Document Details Patient Name: [name], [name]. Date of Service: 12/17/2017 9:30 AM Medical Record Number: [id number] Patient Account Number: [id number] Date of Birth/Sex: 05/23/1950 (66 y.o. M) Treating RN: Curtis Sites Primary Care Provider: Leotis Shames Other Clinician: Referring Provider: Leotis Shames Treating Provider/Extender: Linwood Dibbles, HOYT Weeks in Treatment: 14 Information Obtained from: Patient Chief Complaint Left leg ulcer Electronic Signature(s) Signed: 12/17/2017 5:36:35 PM By: Lenda Kelp PA-C Entered By: Lenda Kelp on 12/17/2017 09:41:02 [name], [name] ([id number]) -------------------------------------------------------------------------------- HPI Details Patient Name: Douglas, Wright. Date of Service: 12/17/2017 9:30 AM Medical Record Number: [id number] Patient Account Number: [id number] Date of Birth/Sex: [birth year] (66 y.o. M) Treating RN: Curtis Sites Primary Care Provider: Leotis Shames Other Clinician: Referring Provider: Leotis Shames Treating Provider/Extender: STONE III, HOYT Weeks in Treatment: 14 History of Present Illness HPI Description: MYASTHENIA GRAVIS (MG) MEDICATION ALERT: Medications that increase weakness in most MG patients should be avoided or used only with great caution. These include: - Fluoroquinolone antibiotics: moxifloxacin (Aveloxo), ciprofloxacin (Ciproo, Proquin XRo), norfloxacin (Noroxino), levofloxacin (Levaquino), ofloxacin (Floxino), gemifloxacin (Factiveo) - Aminoglycoside antibiotics: tobramycin, gentamycin, kanamycin, neomycin, streptomycin - Macrolide antibiotics: erythromycin, azithromycin (Z-Pako, Zithromaxo), telithromycin (Keteko) - Botulinum toxins: Botoxo, Myobloco, Dysporto, Xeomino - Beta-blockers such as propanolol or timolol eyedrops (Timoptico) - Calcium channel blockers (blood pressure medications) - Quinine,  quinidine or procainamide - D-penicillamine (do not confuse with penicillin) - Interferon - Magnesium salts: milk of magnesia, some antacids (Maaloxo, Mylantao) - Curare and related drugs (usually used only during surgery) Other medications may produce problems in some MG patients. Be sure your doctor/dentist knows you have MG when any new medication is prescribed. Contact your myasthenia doctor if you or your local doctor has questions about medications. 09/09/17 on evaluation today patient actually appears for initial evaluation today concerning his left lateral lower extremity ulcer which has been present he tells me since November 2018. He states that gas logs that he'd forgotten he had said somewhere actually fell over landing on his leg causing the injury and unfortunately the left leg has never fully recovered since that time. He does note that there was some improvement initially and now it's pretty much stagnant. He does have venous stasis chronically although he states it's been worse and pass compared to current. He has also been diagnosed with lymphedema although again this is something else that has been deemed the worst in the past compared to present. He does have lymphedema pumps although he really has not been using those on a regular basis. Patient does have diabetes mellitus type II in other than the venous insufficiency and prayerful vascular disease he also has myasthenia gravis board she is on medications long-term. Currently he has been tolerating the medications for this very well he does have a list as noted above in the HPI of medications that he is to avoid that we need to keep in mind. Nonetheless the patient seems to be doing fairly well in general. I do believe his peripheral edema may be contributing to the nonhealing factor in regard to this ulcer. 09/16/17 on evaluation today patient appears to be doing rather well his wound actually appears to show signs of  improvement compared to previous evaluation which is great news. Overall his wound is a little larger but again I did debride the wound sharply last week so this is definitely expected as far as I was concerned. Nonetheless the wound bed itself seems to be  doing excellent. He is having no discomfort normally he does have a little bit of pain with cleansing of the wound by myself today. 09/23/17 on evaluation today patient actually appears to be showing signs of good improvement in regard to his left lateral motion the ulcer. He's been tolerating the dressing changes without complication. Fortunately there does not appear to be any evidence of infection at this point which is great news. Overall I'm extremely happy that he seems to be doing so well and the patient likewise wants to be healed as soon as possible he actually has a pull at home he would love to be in but he hasn't been able to get into this due to what's going on currently. 10/01/16 on evaluation today patient appears to be doing rather well in regard to his left lateral lower Wright ulcer. He's been tolerating the dressing changes without complication. There does not appear to be any evidence of infection at this time. Overall I'm very pleased with the progress that has been made up to this point. I do believe after today will likely be able to switch to a different dressing. [name], [name] ([id number]) 10/08/17 on evaluation today patient actually appears to be doing very well at this point in time. His wound is showing signs of progress which is good news I am not even having to debride as much away as far as the slough is concerned. He does have some Slough which requires debridement at this point however. No fevers, chills, nausea, or vomiting noted at this time. 10/15/17 on evaluation today patient presents for follow-up concerning his ongoing lower extremity ulcer of the left lower extremity. He has been tolerating the dressing  changes without complication. With that being said he tells me at this point that his wound has not really been causing much in the way of discomfort. The wound itself seems to be making good progress with a good granular bed now at this point he did have some Slough on the surface of the wound this was minimal. He has good epithelialization from the anterior tourist posterior portion of the wound. 10/22/17 on evaluation today patient appears to be doing very well in regard to his left lateral lower extremity ulcer. He is shown signs of improvement which is good news. He's been tolerating the dressing changes without complication. In general I think he has done well with the wraps and though he has not made as much progress this week is he has in weeks past I still feel he's making good progress. We will see how things appear in one weeks time. 10/29/17-He is here in follow-up evaluation for a left lower extremity ulcer. He is tolerating dressing changes and voicing no complaints or concerns. We will continue with the same treatment plan, he will return for nurse visit on Monday and follow-up in 2 weeks. 11/12/17 on evaluation today patient's left lateral lower Wright ulcer appears to be doing very well at this point. He's been tolerating the dressing changes without complication. Fortunately his wounds have been making great progress and specifically the remaining wound has shown signs of new epithelialization and granulation week by week. Nonetheless he still does have a little bit of work to do as far as getting this to completely heal. No fevers, chills, nausea, or vomiting noted at this time. 11/19/17 on evaluation today patient appears to be doing fairly well at this point in regard to the left lateral lower Wright ulcer. He has had some  improvement since his last visit in the office which is good news. Overall I'm pleased with the progress there's no evidence of infection in general I think  things are making great improvement. 12/03/17 on evaluation today patient actually appears to be doing better in regard to his left lower extremity ulcer. He's been tolerating the dressing changes without complication. With that being said he does seem to be making good progress at this time. Fortunately there is no evidence of infection. He does have some hyper granular tissue which I think has slightly slowed down his progress. Nonetheless I think that this is a very minor setback and I believe that his wound will likely be healed in the next 1-2 weeks. 12/10/17 on evaluation today patient actually appears to be doing much better in regard to the posterior portion of the wound which is filled in quite nicely. That was the deepest area that has been most concerned with their she's taken point opening still remaining. Nonetheless on the main portion of the wound there is a significant amount of dressing material and scab still overlying the wound bed that does not really allow me to see how things are really doing. This had to be removed today. Nonetheless overall there's no evidence of infection but no matter which dressing use this seems to be getting stuck in causing more problems than helping unfortunately. 12/17/17 on evaluation today patient actually appears to be showing signs of improvement at this time. With that being said he does have slow healing of the ulcer at this point he does have diabetes, peripheral vascular disease, and has had a history of myasthenia gravis. Nonetheless I do feel like he's making some progress here and I do believe even since last week the original wound has done very well. 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 Nonetheless this seems to already be shown signs of improvement which is good  news. Electronic Signature(s) Signed: 12/17/2017 5:36:35 PM By: Lenda Kelp PA-C Entered By: Lenda Kelp on 12/17/2017 09:56:18 [name], [name] ([id number]) -------------------------------------------------------------------------------- Physical Exam Details Patient Name: [name], [name]. Date of Service: 12/17/2017 9:30 AM Medical Record Number: [id number] Patient Account Number: [id number] Date of Birth/Sex: 01-Oct-1950 (66 y.o. M) Treating RN: Curtis Sites Primary Care Provider: Leotis Shames Other Clinician: Referring Provider: Leotis Shames Treating Provider/Extender: STONE III, HOYT Weeks in Treatment: 14 Constitutional Well-nourished and well-hydrated in no acute distress. Respiratory normal breathing without difficulty. Cardiovascular trace pitting edema of the bilateral lower extremities. Psychiatric this patient is able to make decisions and demonstrates good insight into disease process. Alert and Oriented x 3. pleasant and cooperative. Notes Patient's wound bed at this point is shown signs of good improvement which is excellent. He's been tolerating the dressing changes without complication. Fortunately there does not appear to be any evidence of infection. He does still have bilateral lower extremity edema he is wearing his compression which is a zip up compression wrap. That seems to be helping to control the edema for the most part his measurements were a little larger today but again the difference between the wrap in his compression sock is probably again slightly different which is accounting for the increase in the size of his calf and leg in general. Nonetheless he does not have any pitting edema  at this time I think that is still controlling things fairly well for him. Electronic Signature(s) Signed: 12/17/2017 5:36:35 PM By: Lenda Kelp PA-C Entered By: Lenda Kelp on 12/17/2017 09:57:31 [name], [name]  ([id number]) -------------------------------------------------------------------------------- Physician Orders Details Patient Name: [name], [name]. Date of Service: 12/17/2017 9:30 AM Medical Record Number: [id number] Patient Account Number: [id number] Date of Birth/Sex: 05/17/1950 (66 y.o. M) Treating RN: Curtis Sites Primary Care Provider: Leotis Shames Other Clinician: Referring Provider: Leotis Shames Treating Provider/Extender: Linwood Dibbles, HOYT Weeks in Treatment: 14 Verbal / Phone Orders: No Diagnosis Coding ICD-10 Coding Code Description [id number] Non-pressure chronic ulcer of other part of left lower leg with fat layer exposed E11.622 Type 2 diabetes mellitus with other skin ulcer I89.0 Lymphedema, not elsewhere classified G70.00 Myasthenia gravis without (acute) exacerbation I87.2 Venous insufficiency (chronic) (peripheral) I73.9 Peripheral vascular disease, unspecified Wound Cleansing Wound #1 Left,Lateral Lower Leg o Clean wound with Normal Saline. o Cleanse wound with mild soap and water Wound #2 Left,Proximal Lower Leg o Clean wound with Normal Saline. o Cleanse wound with mild soap and water Anesthetic (add to Medication List) Wound #1 Left,Lateral Lower Leg o Topical Lidocaine 4% cream applied to wound bed prior to debridement (In Clinic Only). Wound #2 Left,Proximal Lower Leg o Topical Lidocaine 4% cream applied to wound bed prior to debridement (In Clinic Only). Skin Barriers/Peri-Wound Care Wound #1 Left,Lateral Lower Leg o Moisturizing lotion Wound #2 Left,Proximal Lower Leg o Moisturizing lotion Primary Wound Dressing Wound #1 Left,Lateral Lower Leg o Silver Collagen Wound #2 Left,Proximal Lower Leg o Silver Collagen Secondary Dressing Wound #1 Left,Lateral Lower Leg Aspinwall, Errik L. ([id number]) o Boardered Foam Dressing Wound #2 Left,Proximal Lower Leg o Boardered Foam Dressing Dressing Change Frequency Wound #1  Left,Lateral Lower Leg o Change dressing every other day. Wound #2 Left,Proximal Lower Leg o Change dressing every other day. Follow-up Appointments Wound #1 Left,Lateral Lower Leg o Return Appointment in 1 week. Wound #2 Left,Proximal Lower Leg o Return Appointment in 1 week. Edema Control Wound #1 Left,Lateral Lower Leg o Patient to wear own compression stockings o Elevate legs to the level of the heart and pump ankles as often as possible o Compression Pump: Use compression pump on left lower extremity for 30 minutes, twice daily. - work up to 1 hour twice daily o Compression Pump: Use compression pump on right lower extremity for 30 minutes, twice daily. - work up to 1 hour twice daily Wound #2 Left,Proximal Lower Leg o Patient to wear own compression stockings o Elevate legs to the level of the heart and pump ankles as often as possible o Compression Pump: Use compression pump on left lower extremity for 30 minutes, twice daily. - work up to 1 hour twice daily o Compression Pump: Use compression pump on right lower extremity for 30 minutes, twice daily. - work up to 1 hour twice daily Electronic Signature(s) Signed: 12/17/2017 5:36:35 PM By: Lenda Kelp PA-C Signed: 12/18/2017 5:04:30 PM By: Curtis Sites Entered By: Curtis Sites on 12/17/2017 09:53:44 [name], [name] ([id number]) -------------------------------------------------------------------------------- Problem List Details Patient Name: [name], Douglas L. Date of Service: 12/17/2017 9:30 AM Medical Record Number: [id number] Patient Account Number: [id number] Date of Birth/Sex: 1950-07-28 (66 y.o. M) Treating RN: Curtis Sites Primary Care Provider: Leotis Shames Other Clinician: Referring Provider: Leotis Shames Treating Provider/Extender: Linwood Dibbles, HOYT Weeks in Treatment: 14 Active Problems ICD-10 Evaluated Encounter Code Description Active Date Today Diagnosis L97.822  Non-pressure chronic ulcer of other part of left lower  leg with 09/09/2017 No Yes fat layer exposed E11.622 Type 2 diabetes mellitus with other skin ulcer 09/09/2017 No Yes I89.0 Lymphedema, not elsewhere classified 09/09/2017 No Yes G70.00 Myasthenia gravis without (acute) exacerbation 09/09/2017 No Yes I87.2 Venous insufficiency (chronic) (peripheral) 09/09/2017 No Yes I73.9 Peripheral vascular disease, unspecified 09/09/2017 No Yes Inactive Problems Resolved Problems Electronic Signature(s) Signed: 12/17/2017 5:36:35 PM By: Lenda Kelp PA-C Entered By: Lenda Kelp on 12/17/2017 09:40:46 [name], [name] ([id number]) -------------------------------------------------------------------------------- Progress Note Details Patient Name: [name]. Date of Service: 12/17/2017 9:30 AM Medical Record Number: [id number] Patient Account Number: [id number] Date of Birth/Sex: [birth year] (66 y.o. M) Treating RN: Curtis Sites Primary Care Provider: Leotis Shames Other Clinician: Referring Provider: Leotis Shames Treating Provider/Extender: STONE III, HOYT Weeks in Treatment: 14 Subjective Chief Complaint Information obtained from Patient Left leg ulcer History of Present Illness (HPI) MYASTHENIA GRAVIS (MG) MEDICATION ALERT: Medications that increase weakness in most MG patients should be avoided or used only with great caution. These include: - Fluoroquinolone antibiotics: moxifloxacin (Avelox), ciprofloxacin (Cipro, Proquin XR), norfloxacin (Noroxin), levofloxacin (Levaquin), ofloxacin (Floxin), gemifloxacin (Factive) - Aminoglycoside antibiotics: tobramycin, gentamycin, kanamycin, neomycin, streptomycin - Macrolide antibiotics: erythromycin, azithromycin (Z-Pak, Zithromax), telithromycin (Ketek) - Botulinum toxins: Botox, Myobloc, Dysport, Xeomin - Beta-blockers such as propanolol or timolol eyedrops (Timoptic) - Calcium channel blockers (blood  pressure medications) - Quinine, quinidine or procainamide - D-penicillamine (do not confuse with penicillin) - Interferon - Magnesium salts: milk of magnesia, some antacids (Maalox, Mylanta) - Curare and related drugs (usually used only during surgery) Other medications may produce problems in some MG patients. Be sure your doctor/dentist knows you have MG when any new medication is prescribed. Contact your myasthenia doctor if you or your local doctor has questions about medications. 09/09/17 on evaluation today patient actually appears for initial evaluation today concerning his left lateral lower extremity ulcer which has been present he tells me since November 2018. He states that gas logs that he'd forgotten he had said somewhere actually fell over landing on his leg causing the injury and unfortunately the left leg has never fully recovered since that time. He does note that there was some improvement initially and now it's pretty much stagnant. He does have venous stasis chronically although he states it's been worse and pass compared to current. He has also been diagnosed with lymphedema although again this is something else that has been deemed the worst in the past compared to present. He does have lymphedema pumps although he really has not been using those on a regular basis. Patient does have diabetes mellitus type II in other than the venous insufficiency and prayerful vascular disease he also has myasthenia gravis board she is on medications long-term. Currently he has been tolerating the medications for this very well he does have a list as noted above in the HPI of medications that he is to avoid that we need to keep in mind. Nonetheless the patient seems to be doing fairly well in general. I do believe his peripheral edema may be contributing to the nonhealing factor in regard to this ulcer. 09/16/17 on evaluation today patient appears to be doing rather well his wound  actually appears to show signs of improvement compared to previous evaluation which is great news. Overall his wound is a little larger but again I did debride the wound sharply last week so this is definitely expected as far as I was concerned. Nonetheless the wound bed itself seems to be doing excellent. He is  having no discomfort normally he does have a little bit of pain with cleansing of the wound by myself today. 09/23/17 on evaluation today patient actually appears to be showing signs of good improvement in regard to his left lateral motion the ulcer. He's been tolerating the dressing changes without complication. Fortunately there does not appear to be any evidence of infection at this point which is great news. Overall I'm extremely happy that he seems to be doing so well and the patient likewise wants to be healed as soon as possible he actually has a pull at home he would love to be in but he hasn't [name], Douglas L. ([id number]) been able to get into this due to what's going on currently. 10/01/16 on evaluation today patient appears to be doing rather well in regard to his left lateral lower Wright ulcer. He's been tolerating the dressing changes without complication. There does not appear to be any evidence of infection at this time. Overall I'm very pleased with the progress that has been made up to this point. I do believe after today will likely be able to switch to a different dressing. 10/08/17 on evaluation today patient actually appears to be doing very well at this point in time. His wound is showing signs of progress which is good news I am not even having to debride as much away as far as the slough is concerned. He does have some Slough which requires debridement at this point however. No fevers, chills, nausea, or vomiting noted at this time. 10/15/17 on evaluation today patient presents for follow-up concerning his ongoing lower extremity ulcer of the left lower extremity. He  has been tolerating the dressing changes without complication. With that being said he tells me at this point that his wound has not really been causing much in the way of discomfort. The wound itself seems to be making good progress with a good granular bed now at this point he did have some Slough on the surface of the wound this was minimal. He has good epithelialization from the anterior tourist posterior portion of the wound. 10/22/17 on evaluation today patient appears to be doing very well in regard to his left lateral lower extremity ulcer. He is shown signs of improvement which is good news. He's been tolerating the dressing changes without complication. In general I think he has done well with the wraps and though he has not made as much progress this week is he has in weeks past I still feel he's making good progress. We will see how things appear in one weeks time. 10/29/17-He is here in follow-up evaluation for a left lower extremity ulcer. He is tolerating dressing changes and voicing no complaints or concerns. We will continue with the same treatment plan, he will return for nurse visit on Monday and follow-up in 2 weeks. 11/12/17 on evaluation today patient's left lateral lower Wright ulcer appears to be doing very well at this point. He's been tolerating the dressing changes without complication. Fortunately his wounds have been making great progress and specifically the remaining wound has shown signs of new epithelialization and granulation week by week. Nonetheless he still does have a little bit of work to do as far as getting this to completely heal. No fevers, chills, nausea, or vomiting noted at this time. 11/19/17 on evaluation today patient appears to be doing fairly well at this point in regard to the left lateral lower Wright ulcer. He has had some improvement since his last  visit in the office which is good news. Overall I'm pleased with the progress there's no evidence of  infection in general I think things are making great improvement. 12/03/17 on evaluation today patient actually appears to be doing better in regard to his left lower extremity ulcer. He's been tolerating the dressing changes without complication. With that being said he does seem to be making good progress at this time. Fortunately there is no evidence of infection. He does have some hyper granular tissue which I think has slightly slowed down his progress. Nonetheless I think that this is a very minor setback and I believe that his wound will likely be healed in the next 1-2 weeks. 12/10/17 on evaluation today patient actually appears to be doing much better in regard to the posterior portion of the wound which is filled in quite nicely. That was the deepest area that has been most concerned with their she's taken point opening still remaining. Nonetheless on the main portion of the wound there is a significant amount of dressing material and scab still overlying the wound bed that does not really allow me to see how things are really doing. This had to be removed today. Nonetheless overall there's no evidence of infection but no matter which dressing use this seems to be getting stuck in causing more problems than helping unfortunately. 12/17/17 on evaluation today patient actually appears to be showing signs of improvement at this time. With that being said he does have slow healing of the ulcer at this point he does have diabetes, peripheral vascular disease, and has had a history of myasthenia gravis. Nonetheless I do feel like he's making some progress here and I do believe even since last week the original wound has done very well. 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 Nonetheless this seems to already be shown signs  of improvement which is good news. Patient History Information obtained from Patient. Family History HAMED, DEBELLA ([id number]) Cancer - Father, Heart Disease - Father, No family history of Diabetes, Hereditary Spherocytosis, Hypertension, Kidney Disease, Lung Disease, Seizures, Stroke, Thyroid Problems, Tuberculosis. Social History Never smoker, Marital Status - Married, Alcohol Use - Never, Drug Use - No History, Caffeine Use - Daily. Review of Systems (ROS) Constitutional Symptoms (General Health) Denies complaints or symptoms of Fever, Chills. Respiratory The patient has no complaints or symptoms. Cardiovascular Complains or has symptoms of LE edema. Psychiatric The patient has no complaints or symptoms. Objective Constitutional Well-nourished and well-hydrated in no acute distress. Vitals Time Taken: 9:30 AM, Height: 72 in, Weight: 323.9 lbs, BMI: 43.9, Temperature: 97.9 F, Pulse: 67 bpm, Respiratory Rate: 16 breaths/min, Blood Pressure: 132/68 mmHg. Respiratory normal breathing without difficulty. Cardiovascular trace pitting edema of the bilateral lower extremities. Psychiatric this patient is able to make decisions and demonstrates good insight into disease process. Alert and Oriented x 3. pleasant and cooperative. General Notes: Patient's wound bed at this point is shown signs of good improvement which is excellent. He's been tolerating the dressing changes without complication. Fortunately there does not appear to be any evidence of infection. He does still have bilateral lower extremity edema he is wearing his compression which is a zip up compression wrap. That seems to be helping to control the edema for the most part his measurements were  a little larger today but again the difference between the wrap in his compression sock is probably again slightly different which is accounting for the increase in the size of his calf and leg in general. Nonetheless he does  not have any pitting edema at this time I think that is still controlling things fairly well for him. Integumentary (Hair, Skin) Wound #1 status is Open. Original cause of wound was Gradually Appeared. The wound is located on the Left,Lateral Lower Leg. The wound measures 0.3cm length x 0.2cm width x 0.1cm depth; 0.047cm^2 area and 0.005cm^3 volume. Wound #2 status is Open. Original cause of wound was Trauma. The wound is located on the Left,Proximal Lower Leg. The wound measures 0.7cm length x 0.3cm width x 0.1cm depth; 0.165cm^2 area and 0.016cm^3 volume. The wound is limited to [place], JIBRIL L. ([id number]) skin breakdown. There is no tunneling or undermining noted. There is a medium amount of serosanguineous drainage noted. The wound margin is flat and intact. There is medium (34-66%) red granulation within the wound bed. There is a small (1-33%) amount of necrotic tissue within the wound bed including Adherent Slough. The periwound skin appearance had no abnormalities noted for texture. The periwound skin appearance had no abnormalities noted for color. The periwound skin appearance did not exhibit: Dry/Scaly, Maceration. Periwound temperature was noted as No Abnormality. 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 Cleanse wound with mild soap and water Wound #2 Left,Proximal Lower Leg: Clean wound with Normal Saline. Cleanse wound with mild soap and water Anesthetic (add to Medication List): Wound #1 Left,Lateral Lower Leg: Topical Lidocaine 4% cream applied to wound bed prior to debridement (In Clinic  Only). Wound #2 Left,Proximal Lower Leg: Topical Lidocaine 4% cream applied to wound bed prior to debridement (In Clinic Only). Skin Barriers/Peri-Wound Care: Wound #1 Left,Lateral Lower Leg: Moisturizing lotion Wound #2 Left,Proximal Lower Leg: Moisturizing lotion Primary Wound Dressing: Wound #1 Left,Lateral Lower Leg: Silver Collagen Wound #2 Left,Proximal Lower Leg: Silver Collagen Secondary Dressing: Wound #1 Left,Lateral Lower Leg: Boardered Foam Dressing Wound #2 Left,Proximal Lower Leg: Boardered Foam Dressing Dressing Change Frequency: Wound #1 Left,Lateral Lower Leg: Change dressing every other day. [name], [name] ([id number]) Wound #2 Left,Proximal Lower Leg: Change dressing every other day. Follow-up Appointments: Wound #1 Left,Lateral Lower Leg: Return Appointment in 1 week. Wound #2 Left,Proximal Lower Leg: Return Appointment in 1 week. Edema Control: Wound #1 Left,Lateral Lower Leg: Patient to wear own compression stockings Elevate legs to the level of the heart and pump ankles as often as possible Compression Pump: Use compression pump on left lower extremity for 30 minutes, twice daily. - work up to 1 hour twice daily Compression Pump: Use compression pump on right lower extremity for 30 minutes, twice daily. - work up to 1 hour twice daily Wound #2 Left,Proximal Lower Leg: Patient to wear own compression stockings Elevate legs to the level of the heart and pump ankles as often as possible Compression Pump: Use compression pump on left lower extremity for 30 minutes, twice daily. - work up to 1 hour twice daily Compression Pump: Use compression pump on right lower extremity for 30 minutes, twice daily. - work up to 1 hour twice daily I'm [employer]  suggest at this point that we continue with the Current wound care measures. We will see were things stand at follow-up in one week. He's not doing significantly better we may need to go back to the wrap and just  change the dressing from Prisma to something else since it was getting dry and stuck to the wound. He's in agreement the plan. Please see above for specific wound care orders. We will see patient for re-evaluation in 1 week(s) here in the clinic. If anything worsens or changes patient will contact our office for additional recommendations. Electronic Signature(s) Signed: 12/17/2017 5:36:35 PM By: Lenda Kelp PA-C Entered By: Lenda Kelp on 12/17/2017 09:58:02 [name], [name] ([id number]) -------------------------------------------------------------------------------- ROS/PFSH Details Patient Name: ELLSWORTH, WALDSCHMIDT. Date of Service: 12/17/2017 9:30 AM Medical Record Number: [id number] Patient Account Number: [id number] Date of Birth/Sex: 1950/10/17 (66 y.o. M) Treating RN: Curtis Sites Primary Care Provider: Leotis Shames Other Clinician: Referring Provider: Leotis Shames Treating Provider/Extender: STONE III, HOYT Weeks in Treatment: 14 Information Obtained From Patient Wound History Do you currently have one or more open woundso Yes How many open wounds do you currently haveo 1 Approximately how long have you had your woundso nov 2018 How have you been treating your wound(s) until nowo band-aide, neo sporinHas your wound(s) ever healed and then re-openedo No Have you had any lab work done in the past montho No Have you tested positive for an antibiotic resistant organism (MRSA, VRE)o No Have you tested positive for osteomyelitis (bone infection)o No Have you had any tests for circulation on your legso No Have you had other problems associated with your woundso Swelling Constitutional Symptoms (General Health) Complaints and Symptoms: Negative for: Fever; Chills Cardiovascular Complaints and Symptoms: Positive for: LE edema Medical History: Positive for: Hypertension Eyes Medical History: Positive for: Cataracts - surgery Hematologic/Lymphatic Medical  History: Positive for: Anemia; Lymphedema Respiratory Complaints and Symptoms: No Complaints or Symptoms Endocrine Medical History: Positive for: Type II Diabetes Time with diabetes: 15 yrs Treated with: Insulin, Oral agents [name], PEPLINSKI ([id number]) Blood sugar tested every day: Yes Tested : Psychiatric Complaints and Symptoms: No Complaints or Symptoms HBO Extended History Items Eyes: Cataracts Immunizations Pneumococcal Vaccine: Received Pneumococcal Vaccination: Yes Implantable Devices Family and Social History Cancer: Yes - Father; Diabetes: No; Heart Disease: Yes - Father; Hereditary Spherocytosis: No; Hypertension: No; Kidney Disease: No; Lung Disease: No; Seizures: No; Stroke: No; Thyroid Problems: No; Tuberculosis: No; Never smoker; Marital Status - Married; Alcohol Use: Never; Drug Use: No History; Caffeine Use: Daily; Financial Concerns: No; Food, Clothing or Shelter Needs: No; Support System Lacking: No; Transportation Concerns: No; Advanced Directives: No; Patient does not want information on Advanced Directives; Do not resuscitate: No; Living Will: Yes (Copy provided); Medical Power of Attorney: No Physician Affirmation I have reviewed and agree with the above information. Electronic Signature(s) Signed: 12/17/2017 5:36:35 PM By: Lenda Kelp PA-C Signed: 12/18/2017 5:04:30 PM By: Curtis Sites Entered By: Lenda Kelp on 12/17/2017 09:56:40 Violette, [name] ([id number]) -------------------------------------------------------------------------------- SuperBill Details Patient Name: BOSCO, PAPARELLA. Date of Service: 12/17/2017 Medical Record Number: [id number] Patient Account Number: [id number] Date of Birth/Sex: 09/17/1950 (66 y.o. M) Treating RN: Curtis Sites Primary Care Provider: Leotis Shames Other Clinician: Referring Provider: Leotis Shames Treating Provider/Extender: Linwood Dibbles, HOYT Weeks in Treatment: 14 Diagnosis Coding ICD-10  Codes Code Description [phone] Non-pressure chronic ulcer of other part of left lower leg with fat layer exposed E11.622 Type 2 diabetes mellitus with other skin ulcer I89.0  Lymphedema, not elsewhere classified G70.00 Myasthenia gravis without (acute) exacerbation I87.2 Venous insufficiency (chronic) (peripheral) I73.9 Peripheral vascular disease, unspecified Facility Procedures CPT4 Code: [id number] Description: 99214 - WOUND CARE VISIT-LEV 4 EST PT Modifier: Quantity: 1 Physician Procedures CPT4 Code Description: [id number] 99213 - WC PHYS LEVEL 3 - EST PT ICD-10 Diagnosis Description 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 of other part of left lower leg wit I89.0 Lymphedema, not elsewhere classified E11.622 Type 2 diabetes mellitus with other  skin ulcer G70.00 Myasthenia gravis without (acute) exacerbation Modifier: h fat layer expos Quantity: 1 ed Electronic Signature(s) Signed: 12/19/2017 3:07:42 PM By: Lenda Kelp PA-C Previous Signature: 12/17/2017 5:36:35 PM Version By: Lenda Kelp PA-C Entered By: Francie Massing on 12/18/2017 16:49:50

## 2017-12-19 NOTE — Progress Notes (Signed)
HASSELL, PATRAS ([id number]) Visit Report for 12/17/2017 Arrival Information Details Patient Name: NAKUL, AVINO. Date of Service: 12/17/2017 9:30 AM Medical Record Number: [id number] Patient Account Number: [id number] Date of Birth/Sex: [birth year] (67 y.o. M) Treating RN: Curtis Sites Primary Care Christiaan Strebeck: Leotis Shames Other Clinician: Referring Rubby Barbary: Leotis Shames Treating Keaundra Stehle/Extender: STONE III, HOYT Weeks in Treatment: 14 Visit Information History Since Last Visit Added or deleted any medications: No Patient Arrived: Ambulatory Any new allergies or adverse reactions: No Arrival Time: 09:28 Had a fall or experienced change in No Accompanied By: self activities of daily living that may affect Transfer Assistance: None risk of falls: Patient Identification Verified: Yes Signs or symptoms of abuse/neglect since last visito No Secondary Verification Process Completed: Yes Hospitalized since last visit: No Patient Requires Transmission-Based No Implantable device outside of the clinic excluding No Precautions: cellular tissue based products placed in the center Patient Has Alerts: Yes since last visit: Has Dressing in Place as Prescribed: Yes Pain Present Now: No Electronic Signature(s) Signed: 12/17/2017 4:26:07 PM By: Dayton Martes RCP, RRT, CHT Entered By: Dayton Martes on 12/17/2017 09:29:50 Majkowski, [name] ([id number]) -------------------------------------------------------------------------------- Clinic Level of Care Assessment Details Patient Name: BURAK, ZERBE L. Date of Service: 12/17/2017 9:30 AM Medical Record Number: [id number] Patient Account Number: [id number] Date of Birth/Sex: Jun 03, 1950 (67 y.o. M) Treating RN: Curtis Sites Primary Care Wilmina Maxham: Leotis Shames Other Clinician: Referring Joury Allcorn: Leotis Shames Treating Amarys Sliwinski/Extender: STONE III, HOYT Weeks in Treatment: 14 Clinic Level of Care  Assessment Items TOOL 4 Quantity Score []  - Use when only an EandM is performed on FOLLOW-UP visit 0 ASSESSMENTS - Nursing Assessment / Reassessment X - Reassessment of Co-morbidities (includes updates in patient status) 1 10 X- 1 5 Reassessment of Adherence to Treatment Plan ASSESSMENTS - Wound and Skin Assessment / Reassessment []  - Simple Wound Assessment / Reassessment - one wound 0 X- 2 5 Complex Wound Assessment / Reassessment - multiple wounds []  - 0 Dermatologic / Skin Assessment (not related to wound area) ASSESSMENTS - Focused Assessment X - Circumferential Edema Measurements - multi extremities 1 5 []  - 0 Nutritional Assessment / Counseling / Intervention X- 1 5 Lower Extremity Assessment (monofilament, tuning fork, pulses) []  - 0 Peripheral Arterial Disease Assessment (using hand held doppler) ASSESSMENTS - Ostomy and/or Continence Assessment and Care []  - Incontinence Assessment and Management 0 []  - 0 Ostomy Care Assessment and Management (repouching, etc.) PROCESS - Coordination of Care X - Simple Patient / Family Education for ongoing care 1 15 []  - 0 Complex (extensive) Patient / Family Education for ongoing care X- 1 10 Staff obtains Chiropractor, Records, Test Results / Process Orders []  - 0 Staff telephones HHA, Nursing Homes / Clarify orders / etc []  - 0 Routine Transfer to another Facility (non-emergent condition) []  - 0 Routine Hospital Admission (non-emergent condition) []  - 0 New Admissions / Manufacturing engineer / Ordering NPWT, Apligraf, etc. []  - 0 Emergency Hospital Admission (emergent condition) X- 1 10 Simple Discharge Coordination STIVEN, KASPAR. ([id number]) []  - 0 Complex (extensive) Discharge Coordination PROCESS - Special Needs []  - Pediatric / Minor Patient Management 0 []  - 0 Isolation Patient Management []  - 0 Hearing / Language / Visual special needs []  - 0 Assessment of Community assistance (transportation, D/C planning,  etc.) []  - 0 Additional assistance / Altered mentation []  - 0 Support Surface(s) Assessment (bed, cushion, seat, etc.) INTERVENTIONS - Wound Cleansing / Measurement []  - Simple Wound Cleansing - one wound 0 X-  2 5 Complex Wound Cleansing - multiple wounds X- 1 5 Wound Imaging (photographs - any number of wounds) []  - 0 Wound Tracing (instead of photographs) []  - 0 Simple Wound Measurement - one wound X- 2 5 Complex Wound Measurement - multiple wounds INTERVENTIONS - Wound Dressings X - Small Wound Dressing one or multiple wounds 2 10 []  - 0 Medium Wound Dressing one or multiple wounds []  - 0 Large Wound Dressing one or multiple wounds []  - 0 Application of Medications - topical []  - 0 Application of Medications - injection INTERVENTIONS - Miscellaneous []  - External ear exam 0 []  - 0 Specimen Collection (cultures, biopsies, blood, body fluids, etc.) []  - 0 Specimen(s) / Culture(s) sent or taken to Lab for analysis []  - 0 Patient Transfer (multiple staff / Nurse, adult / Similar devices) []  - 0 Simple Staple / Suture removal (25 or less) []  - 0 Complex Staple / Suture removal (26 or more) []  - 0 Hypo / Hyperglycemic Management (close monitor of Blood Glucose) []  - 0 Ankle / Brachial Index (ABI) - do not check if billed separately X- 1 5 Vital Signs Capozzi, Blas L. ([id number]) Has the patient been seen at the hospital within the last three years: Yes Total Score: 120 Level Of Care: New/Established - Level 4 Electronic Signature(s) Signed: 12/18/2017 5:04:30 PM By: Curtis Sites Entered By: Curtis Sites on 12/17/2017 09:58:53 Osier, [name] ([id number]) -------------------------------------------------------------------------------- Complex / Palliative Patient Assessment Details Patient Name: NAHUEL, WILBERT L. Date of Service: 12/17/2017 9:30 AM Medical Record Number: [id number] Patient Account Number: [id number] Date of Birth/Sex: [birth year] (67 y.o.  M) Treating RN: Curtis Sites Primary Care Selah Zelman: Leotis Shames Other Clinician: Referring Jermani Pund: Leotis Shames Treating Hugh Garrow/Extender: STONE III, HOYT Weeks in Treatment: 14 Palliative Management Criteria Complex Wound Management Criteria Patient has remarkable or complex co-morbidities requiring medications or treatments that extend wound healing times. Examples: o Diabetes mellitus with chronic renal failure or end stage renal disease requiring dialysis o Advanced or poorly controlled rheumatoid arthritis o Diabetes mellitus and end stage chronic obstructive pulmonary disease o Active cancer with current chemo- or radiation therapy PVD and myasthenia gravis Care Approach Wound Care Plan: Complex Wound Management Electronic Signature(s) Signed: 12/17/2017 5:36:35 PM By: Lenda Kelp PA-C Signed: 12/18/2017 5:04:30 PM By: Curtis Sites Entered By: Curtis Sites on 12/17/2017 09:50:37 Zeiger, [name] ([id number]) -------------------------------------------------------------------------------- Encounter Discharge Information Details Patient Name: MCGWIRE, DASARO L. Date of Service: 12/17/2017 9:30 AM Medical Record Number: [id number] Patient Account Number: [id number] Date of Birth/Sex: Jan 16, 1951 (67 y.o. M) Treating RN: Curtis Sites Primary Care Mahathi Pokorney: Leotis Shames Other Clinician: Referring Abel Hageman: Leotis Shames Treating Raymon Schlarb/Extender: Linwood Dibbles, HOYT Weeks in Treatment: 14 Encounter Discharge Information Items Discharge Condition: Stable Ambulatory Status: Ambulatory Discharge Destination: Home Transportation: Private Auto Accompanied By: self Schedule Follow-up Appointment: Yes Clinical Summary of Care: Electronic Signature(s) Signed: 12/18/2017 5:04:30 PM By: Curtis Sites Entered By: Curtis Sites on 12/17/2017 09:59:32 Wolter, [name]  ([id number]) -------------------------------------------------------------------------------- Lower Extremity Assessment Details Patient Name: DUJUAN, STANKOWSKI L. Date of Service: 12/17/2017 9:30 AM Medical Record Number: [id number] Patient Account Number: [id number] Date of Birth/Sex: 1951-03-10 (67 y.o. M) Treating RN: Huel Coventry Primary Care Maleiah Dula: Leotis Shames Other Clinician: Referring Romy Mcgue: Leotis Shames Treating Nai Borromeo/Extender: STONE III, HOYT Weeks in Treatment: 14 Edema Assessment Assessed: [Left: No] [Right: No] [Left: Edema] [Right: :] Calf Left: Right: Point of Measurement: 35 cm From Medial Instep 42 cm cm Ankle Left: Right: Point of Measurement: 14 cm From Medial Instep  23 cm cm Vascular Assessment Pulses: Dorsalis Pedis Palpable: [Left:Yes] Posterior Tibial Extremity colors, hair growth, and conditions: Extremity Color: [Left:Normal] Hair Growth on Extremity: [Left:Yes] Temperature of Extremity: [Left:Cool] Capillary Refill: [Left:< 3 seconds] Toe Nail Assessment Left: Right: Thick: No Discolored: No Deformed: No Improper Length and Hygiene: No Electronic Signature(s) Signed: 12/17/2017 4:44:51 PM By: Elliot Gurney, BSN, RN, CWS, Kim RN, BSN Entered By: Elliot Gurney, BSN, RN, CWS, Kim on 12/17/2017 09:44:00 Derstine, [name] ([id number]) -------------------------------------------------------------------------------- Multi Wound Chart Details Patient Name: DAILON, SHEERAN L. Date of Service: 12/17/2017 9:30 AM Medical Record Number: [id number] Patient Account Number: [id number] Date of Birth/Sex: [birth year] (67 y.o. M) Treating RN: Curtis Sites Primary Care Kailynne Ferrington: Leotis Shames Other Clinician: Referring Daziya Redmond: Leotis Shames Treating Grey Rakestraw/Extender: STONE III, HOYT Weeks in Treatment: 14 Vital Signs Height(in): 72 Pulse(bpm): 67 Weight(lbs): 323.9 Blood Pressure(mmHg): 132/68 Body Mass Index(BMI): 44 Temperature(F): 97.9 Respiratory  Rate 16 (breaths/min): Photos: [1:No Photos] [2:No Photos] [N/A:N/A] Wound Location: [1:Left, Lateral Lower Leg] [2:Left Lower Leg - Proximal] [N/A:N/A] Wounding Event: [1:Gradually Appeared] [2:Trauma] [N/A:N/A] Primary Etiology: [1:Diabetic Wound/Ulcer of the Lower Extremity] [2:Trauma, Other] [N/A:N/A] Comorbid History: [1:N/A] [2:Cataracts, Anemia, Lymphedema, Hypertension, Type II Diabetes] [N/A:N/A] Date Acquired: [1:08/26/2017] [2:12/12/2017] [N/A:N/A] Weeks of Treatment: [1:14] [2:0] [N/A:N/A] Wound Status: [1:Open] [2:Open] [N/A:N/A] Measurements L x W x D [1:0.3x0.2x0.1] [2:0.7x0.3x0.1] [N/A:N/A] (cm) Area (cm) : [1:0.047] [2:0.165] [N/A:N/A] Volume (cm) : [1:0.005] [2:0.016] [N/A:N/A] % Reduction in Area: [1:98.50%] [2:0.00%] [N/A:N/A] % Reduction in Volume: [1:98.40%] [2:0.00%] [N/A:N/A] Classification: [1:Grade 1] [2:Partial Thickness] [N/A:N/A] Exudate Amount: [1:N/A] [2:Medium] [N/A:N/A] Exudate Type: [1:N/A] [2:Serosanguineous] [N/A:N/A] Exudate Color: [1:N/A] [2:red, brown] [N/A:N/A] Wound Margin: [1:N/A] [2:Flat and Intact] [N/A:N/A] Granulation Amount: [1:N/A] [2:Medium (34-66%)] [N/A:N/A] Granulation Quality: [1:N/A] [2:Red] [N/A:N/A] Necrotic Amount: [1:N/A] [2:Small (1-33%)] [N/A:N/A] Periwound Skin Texture: [1:No Abnormalities Noted] [2:Excoriation: No Induration: No Callus: No Crepitus: No Rash: No Scarring: No] [N/A:N/A] Periwound Skin Moisture: [1:No Abnormalities Noted] [2:Maceration: No Dry/Scaly: No] [N/A:N/A] Periwound Skin Color: [1:No Abnormalities Noted] [2:Atrophie Blanche: No Cyanosis: No Ecchymosis: No Erythema: No] [N/A:N/A] Hemosiderin Staining: No Mottled: No Pallor: No Rubor: No Temperature: N/A No Abnormality N/A Tenderness on Palpation: No No N/A Wound Preparation: N/A Ulcer Cleansing: N/A Rinsed/Irrigated with Saline Topical Anesthetic Applied: None Treatment Notes Electronic Signature(s) Signed: 12/18/2017 5:04:30 PM By: Curtis Sites Entered By: Curtis Sites on 12/17/2017 09:51:33 Zeleznik, [name] ([id number]) -------------------------------------------------------------------------------- Multi-Disciplinary Care Plan Details Patient Name: OBE, AHLERS L. Date of Service: 12/17/2017 9:30 AM Medical Record Number: [id number] Patient Account Number: [id number] Date of Birth/Sex: 06-Aug-1950 (67 y.o. M) Treating RN: Curtis Sites Primary Care Karine Garn: Leotis Shames Other Clinician: Referring Lun Muro: Leotis Shames Treating Koven Belinsky/Extender: STONE III, HOYT Weeks in Treatment: 14 Active Inactive ` Orientation to the Wound Care Program Nursing Diagnoses: Knowledge deficit related to the wound healing center program Goals: Patient/caregiver will verbalize understanding of the Wound Healing Center Program Date Initiated: 09/09/2017 Target Resolution Date: 09/30/2017 Goal Status: Active Interventions: Provide education on orientation to the wound center Notes: ` Wound/Skin Impairment Nursing Diagnoses: Impaired tissue integrity Goals: Patient/caregiver will verbalize understanding of skin care regimen Date Initiated: 09/09/2017 Target Resolution Date: 09/28/2017 Goal Status: Active Ulcer/skin breakdown will have a volume reduction of 30% by week 4 Date Initiated: 09/09/2017 Target Resolution Date: 09/28/2017 Goal Status: Active Interventions: Assess patient/caregiver ability to obtain necessary supplies Assess patient/caregiver ability to perform ulcer/skin care regimen upon admission and as needed Assess ulceration(s) every visit Treatment Activities: Skin care regimen initiated : 09/09/2017 Notes: Electronic Signature(s) Signed: 12/18/2017 5:04:30 PM By: Curtis Sites  DEQUANTE, TREMAINE ([id number]) Entered By: Curtis Sites on 12/17/2017 09:50:43 TREYSHAWN, MULDREW ([id number]) -------------------------------------------------------------------------------- Pain Assessment Details Patient  Name: TAVIOUS, GRIESINGER. Date of Service: 12/17/2017 9:30 AM Medical Record Number: [id number] Patient Account Number: [id number] Date of Birth/Sex: 1950/09/09 (67 y.o. M) Treating RN: Curtis Sites Primary Care Eppie Barhorst: Leotis Shames Other Clinician: Referring Jodine Muchmore: Leotis Shames Treating Anette Barra/Extender: STONE III, HOYT Weeks in Treatment: 14 Active Problems Location of Pain Severity and Description of Pain Patient Has Paino No Site Locations Pain Management and Medication Current Pain Management: Electronic Signature(s) Signed: 12/17/2017 4:26:07 PM By: Dayton Martes RCP, RRT, CHT Signed: 12/18/2017 5:04:30 PM By: Curtis Sites Entered By: Dayton Martes on 12/17/2017 09:29:58 [name] ([id number]) -------------------------------------------------------------------------------- Patient/Caregiver Education Details Patient Name: HOSAM, MCFETRIDGE. Date of Service: 12/17/2017 9:30 AM Medical Record Number: [id number] Patient Account Number: [id number] Date of Birth/Gender: 1951/03/13 (67 y.o. M) Treating RN: Curtis Sites Primary Care Physician: Leotis Shames Other Clinician: Referring Physician: Leotis Shames Treating Physician/Extender: Skeet Simmer in Treatment: 14 Education Assessment Education Provided To: Patient Education Topics Provided Wound/Skin Impairment: Handouts: Other: wound care as ordered Methods: Demonstration, Explain/Verbal Responses: State content correctly Electronic Signature(s) Signed: 12/18/2017 5:04:30 PM By: Curtis Sites Entered By: Curtis Sites on 12/17/2017 09:59:46 Mulnix, [name] ([id number]) -------------------------------------------------------------------------------- Wound Assessment Details Patient Name: JESS, TONEY L. Date of Service: 12/17/2017 9:30 AM Medical Record Number: [id number] Patient Account Number: [id number] Date of Birth/Sex: [birth year] (67 y.o.  M) Treating RN: Huel Coventry Primary Care Kamren Heintzelman: Leotis Shames Other Clinician: Referring Rowan Pollman: Leotis Shames Treating Jeison Delpilar/Extender: STONE III, HOYT Weeks in Treatment: 14 Wound Status Wound Number: 1 Primary Diabetic Wound/Ulcer of the Lower Etiology: Extremity Wound Location: Left, Lateral Lower Leg Wound Status: Open Wounding Event: Gradually Appeared Date Acquired: 08/26/2017 Weeks Of Treatment: 14 Clustered Wound: No Photos Photo Uploaded By: Elliot Gurney, BSN, RN, CWS, Kim on 12/17/2017 16:31:16 Wound Measurements Length: (cm) 0.3 Width: (cm) 0.2 Depth: (cm) 0.1 Area: (cm) 0.047 Volume: (cm) 0.005 % Reduction in Area: 98.5% % Reduction in Volume: 98.4% Wound Description Classification: Grade 1 Periwound Skin Texture Texture Color No Abnormalities Noted: No No Abnormalities Noted: No Moisture No Abnormalities Noted: No Treatment Notes Wound #1 (Left, Lateral Lower Leg) 1. Cleansed with: Clean wound with Normal Saline Cleanse wound with antibacterial soap and water 2. Anesthetic Topical Lidocaine 4% cream to wound bed prior to debridement ANTOINNE, SPADACCINI L. ([id number]) 3. Peri-wound Care: Skin Prep 4. Dressing Applied: Prisma Ag Other dressing (specify in notes) 7. Secured with Patient to wear own compression stockings Notes coverlet Electronic Signature(s) Signed: 12/17/2017 4:44:51 PM By: Elliot Gurney, BSN, RN, CWS, Kim RN, BSN Entered By: Elliot Gurney, BSN, RN, CWS, Kim on 12/17/2017 09:39:19 Caryl, Manas [name] ([id number]) -------------------------------------------------------------------------------- Wound Assessment Details Patient Name: AUSTINE, WIEDEMAN. Date of Service: 12/17/2017 9:30 AM Medical Record Number: [id number] Patient Account Number: [id number] Date of Birth/Sex: 1950-09-09 (67 y.o. M) Treating RN: Huel Coventry Primary Care Alayha Babineaux: Leotis Shames Other Clinician: Referring Wyndham Santilli: Leotis Shames Treating Chinmayi Rumer/Extender: STONE III,  HOYT Weeks in Treatment: 14 Wound Status Wound Number: 2 Primary Trauma, Other Etiology: Wound Location: Left Lower Leg - Proximal Wound Status: Open Wounding Event: Trauma Comorbid Cataracts, Anemia, Lymphedema, Date Acquired: 12/12/2017 History: Hypertension, Type II Diabetes Weeks Of Treatment: 0 Clustered Wound: No Photos Photo Uploaded By: Elliot Gurney, BSN, RN, CWS, Kim on 12/17/2017 16:31:17 Wound Measurements Length: (cm) 0.7 Width: (cm) 0.3 Depth: (cm) 0.1 Area: (cm) 0.165 Volume: (cm) 0.016 % Reduction in Area: 0% %  Reduction in Volume: 0% Tunneling: No Undermining: No Wound Description Classification: Partial Thickness Wound Margin: Flat and Intact Exudate Amount: Medium Exudate Type: Serosanguineous Exudate Color: red, brown Foul Odor After Cleansing: No Slough/Fibrino No Wound Bed Granulation Amount: Medium (34-66%) Exposed Structure Granulation Quality: Red Fascia Exposed: No Necrotic Amount: Small (1-33%) Fat Layer (Subcutaneous Tissue) Exposed: No Necrotic Quality: Adherent Slough Tendon Exposed: No Muscle Exposed: No Joint Exposed: No Bone Exposed: No Limited to Skin Breakdown Periwound Skin Texture Petterson, Tobenna L. ([id number]) Texture Color No Abnormalities Noted: Yes No Abnormalities Noted: Yes Moisture Temperature / Pain No Abnormalities Noted: No Temperature: No Abnormality Dry / Scaly: No Maceration: No Wound Preparation Ulcer Cleansing: Rinsed/Irrigated with Saline Topical Anesthetic Applied: None Treatment Notes Wound #2 (Left, Proximal Lower Leg) 1. Cleansed with: Clean wound with Normal Saline Cleanse wound with antibacterial soap and water 2. Anesthetic Topical Lidocaine 4% cream to wound bed prior to debridement 3. Peri-wound Care: Skin Prep 4. Dressing Applied: Prisma Ag Other dressing (specify in notes) 7. Secured with Patient to wear own compression stockings Notes coverlet Electronic Signature(s) Signed:  12/17/2017 4:44:51 PM By: Elliot Gurney, BSN, RN, CWS, Kim RN, BSN Entered By: Elliot Gurney, BSN, RN, CWS, Kim on 12/17/2017 09:41:33 Roback, [name] ([id number]) -------------------------------------------------------------------------------- Vitals Details Patient Name: TAYT, MOYERS. Date of Service: 12/17/2017 9:30 AM Medical Record Number: [id number] Patient Account Number: [id number] Date of Birth/Sex: Sep 18, 1950 (67 y.o. M) Treating RN: Curtis Sites Primary Care Destani Wamser: Leotis Shames Other Clinician: Referring Makaya Juneau: Leotis Shames Treating Adhvik Canady/Extender: STONE III, HOYT Weeks in Treatment: 14 Vital Signs Time Taken: 09:30 Temperature (F): 97.9 Height (in): 72 Pulse (bpm): 67 Weight (lbs): 323.9 Respiratory Rate (breaths/min): 16 Body Mass Index (BMI): 43.9 Blood Pressure (mmHg): 132/68 Reference Range: 80 - 120 mg / dl Electronic Signature(s) Signed: 12/17/2017 4:26:07 PM By: Dayton Martes RCP, RRT, CHT Entered By: Weyman Rodney, Lucio Edward on 12/17/2017 09:31:59

## 2017-12-24 ENCOUNTER — Encounter: Payer: BLUE CROSS/BLUE SHIELD | Attending: Physician Assistant | Admitting: Physician Assistant

## 2017-12-24 DIAGNOSIS — I890 Lymphedema, not elsewhere classified: Secondary | ICD-10-CM | POA: Diagnosis not present

## 2017-12-24 DIAGNOSIS — I10 Essential (primary) hypertension: Secondary | ICD-10-CM | POA: Diagnosis not present

## 2017-12-24 DIAGNOSIS — G7000 Myasthenia gravis without (acute) exacerbation: Secondary | ICD-10-CM | POA: Diagnosis not present

## 2017-12-24 DIAGNOSIS — Z8249 Family history of ischemic heart disease and other diseases of the circulatory system: Secondary | ICD-10-CM | POA: Diagnosis not present

## 2017-12-24 DIAGNOSIS — Z09 Encounter for follow-up examination after completed treatment for conditions other than malignant neoplasm: Secondary | ICD-10-CM | POA: Insufficient documentation

## 2017-12-24 DIAGNOSIS — L97922 Non-pressure chronic ulcer of unspecified part of left lower leg with fat layer exposed: Secondary | ICD-10-CM | POA: Diagnosis present

## 2017-12-24 DIAGNOSIS — E1151 Type 2 diabetes mellitus with diabetic peripheral angiopathy without gangrene: Secondary | ICD-10-CM | POA: Insufficient documentation

## 2017-12-24 DIAGNOSIS — I872 Venous insufficiency (chronic) (peripheral): Secondary | ICD-10-CM | POA: Diagnosis not present

## 2017-12-24 DIAGNOSIS — Z79899 Other long term (current) drug therapy: Secondary | ICD-10-CM | POA: Diagnosis not present

## 2017-12-24 DIAGNOSIS — E11622 Type 2 diabetes mellitus with other skin ulcer: Secondary | ICD-10-CM | POA: Diagnosis present

## 2017-12-25 NOTE — Progress Notes (Signed)
TERRILL, ALPERIN ([id number]) Visit Report for 12/24/2017 Arrival Information Details Patient Name: LIEV, BROCKBANK. Date of Service: 12/24/2017 9:15 AM Medical Record Number: [id number] Patient Account Number: [id number] Date of Birth/Sex: 02-17-1951 (67 y.o. M) Treating RN: Curtis Sites Primary Care Jendaya Gossett: Leotis Shames Other Clinician: Referring Francisca Harbuck: Leotis Shames Treating Osiah Haring/Extender: STONE III, HOYT Weeks in Treatment: 15 Visit Information History Since Last Visit Added or deleted any medications: No Patient Arrived: Ambulatory Any new allergies or adverse reactions: No Arrival Time: 09:18 Had a fall or experienced change in No Accompanied By: self activities of daily living that may affect Transfer Assistance: None risk of falls: Patient Identification Verified: Yes Signs or symptoms of abuse/neglect since last visito No Secondary Verification Process Completed: Yes Hospitalized since last visit: No Patient Requires Transmission-Based No Implantable device outside of the clinic excluding No Precautions: cellular tissue based products placed in the center Patient Has Alerts: Yes since last visit: Has Dressing in Place as Prescribed: Yes Pain Present Now: No Electronic Signature(s) Signed: 12/24/2017 3:15:29 PM By: Dayton Martes RCP, RRT, CHT Entered By: Dayton Martes on 12/24/2017 09:19:23 [name] ([id number]) -------------------------------------------------------------------------------- Encounter Discharge Information Details Patient Name: MIROSLAV, GIN L. Date of Service: 12/24/2017 9:15 AM Medical Record Number: [id number] Patient Account Number: [id number] Date of Birth/Sex: 13-Sep-1950 (67 y.o. M) Treating RN: Renne Crigler Primary Care Zoriyah Scheidegger: Leotis Shames Other Clinician: Referring Eulamae Greenstein: Leotis Shames Treating Paublo Warshawsky/Extender: STONE III, HOYT Weeks in Treatment: 15 Encounter Discharge  Information Items Discharge Condition: Stable Ambulatory Status: Ambulatory Discharge Destination: Home Transportation: Private Auto Schedule Follow-up Appointment: Yes Clinical Summary of Care: Post Procedure Vitals: Temperature (F): 97.9 Pulse (bpm): 64 Respiratory Rate (breaths/min): 18 Blood Pressure (mmHg): 142/72 Electronic Signature(s) Signed: 12/24/2017 4:16:12 PM By: Renne Crigler Entered By: Renne Crigler on 12/24/2017 09:53:49 Merkel, [name] ([id number]) -------------------------------------------------------------------------------- Lower Extremity Assessment Details Patient Name: PACER, DORN L. Date of Service: 12/24/2017 9:15 AM Medical Record Number: [id number] Patient Account Number: [id number] Date of Birth/Sex: 1951-01-24 (67 y.o. M) Treating RN: Huel Coventry Primary Care Milea Klink: Leotis Shames Other Clinician: Referring Bastian Andreoli: Leotis Shames Treating Edessa Jakubowicz/Extender: STONE III, HOYT Weeks in Treatment: 15 Edema Assessment Assessed: [Left: No] [Right: No] [Left: Edema] [Right: :] Calf Left: Right: Point of Measurement: 35 cm From Medial Instep 41.5 cm cm Ankle Left: Right: Point of Measurement: 14 cm From Medial Instep 23 cm cm Vascular Assessment Claudication: Claudication Assessment [Left:None] Pulses: Dorsalis Pedis Palpable: [Left:Yes] Posterior Tibial Extremity colors, hair growth, and conditions: Extremity Color: [Left:Normal] Hair Growth on Extremity: [Left:Yes] Temperature of Extremity: [Left:Warm] Capillary Refill: [Left:< 3 seconds] Toe Nail Assessment Left: Right: Thick: No Discolored: No Deformed: No Improper Length and Hygiene: No Electronic Signature(s) Signed: 12/24/2017 4:33:57 PM By: Elliot Gurney, BSN, RN, CWS, Kim RN, BSN Entered By: Elliot Gurney, BSN, RN, CWS, Kim on 12/24/2017 09:29:30 [name] ([id number]) -------------------------------------------------------------------------------- Multi Wound Chart  Details Patient Name: TARIK, TEIXEIRA L. Date of Service: 12/24/2017 9:15 AM Medical Record Number: [id number] Patient Account Number: [id number] Date of Birth/Sex: 06-07-1950 (67 y.o. M) Treating RN: Curtis Sites Primary Care Jeanpierre Thebeau: Leotis Shames Other Clinician: Referring Hidaya Daniel: Leotis Shames Treating Corby Villasenor/Extender: STONE III, HOYT Weeks in Treatment: 15 Vital Signs Height(in): 72 Pulse(bpm): 64 Weight(lbs): 323.9 Blood Pressure(mmHg): 142/72 Body Mass Index(BMI): 44 Temperature(F): 97.9 Respiratory Rate 16 (breaths/min): Photos: [1:No Photos] [2:No Photos] [N/A:N/A] Wound Location: [1:Left, Lateral Lower Leg] [2:Left, Proximal Lower Leg] [N/A:N/A] Wounding Event: [1:Gradually Appeared] [2:Trauma] [N/A:N/A] Primary Etiology: [1:Diabetic Wound/Ulcer of the Lower Extremity] [2:Trauma, Other] [N/A:N/A] Date  Acquired: [1:08/26/2017] [2:12/12/2017] [N/A:N/A] Weeks of Treatment: [1:15] [2:1] [N/A:N/A] Wound Status: [1:Open] [2:Open] [N/A:N/A] Measurements L x W x D [1:0.3x0.2x0.1] [2:0.4x0.2x0.1] [N/A:N/A] (cm) Area (cm) : [1:0.047] [2:0.063] [N/A:N/A] Volume (cm) : [1:0.005] [2:0.006] [N/A:N/A] % Reduction in Area: [1:98.50%] [2:61.80%] [N/A:N/A] % Reduction in Volume: [1:98.40%] [2:62.50%] [N/A:N/A] Classification: [1:Grade 1] [2:Partial Thickness] [N/A:N/A] Periwound Skin Texture: [1:No Abnormalities Noted] [2:No Abnormalities Noted] [N/A:N/A] Periwound Skin Moisture: [1:No Abnormalities Noted] [2:No Abnormalities Noted] [N/A:N/A] Periwound Skin Color: [1:No Abnormalities Noted No] [2:No Abnormalities Noted No] [N/A:N/A N/A] Treatment Notes Electronic Signature(s) Signed: 12/24/2017 5:00:45 PM By: Curtis Sites Entered By: Curtis Sites on 12/24/2017 09:35:33 Wentzel, [name] ([id number]) -------------------------------------------------------------------------------- Multi-Disciplinary Care Plan Details Patient Name: AIZEN, DUVAL L. Date of Service:  12/24/2017 9:15 AM Medical Record Number: [id number] Patient Account Number: [id number] Date of Birth/Sex: Feb 02, 1951 (67 y.o. M) Treating RN: Curtis Sites Primary Care Martino Tompson: Leotis Shames Other Clinician: Referring Ingrid Shifrin: Leotis Shames Treating Renleigh Ouellet/Extender: STONE III, HOYT Weeks in Treatment: 15 Active Inactive ` Orientation to the Wound Care Program Nursing Diagnoses: Knowledge deficit related to the wound healing center program Goals: Patient/caregiver will verbalize understanding of the Wound Healing Center Program Date Initiated: 09/09/2017 Target Resolution Date: 09/30/2017 Goal Status: Active Interventions: Provide education on orientation to the wound center Notes: ` Wound/Skin Impairment Nursing Diagnoses: Impaired tissue integrity Goals: Patient/caregiver will verbalize understanding of skin care regimen Date Initiated: 09/09/2017 Target Resolution Date: 09/28/2017 Goal Status: Active Ulcer/skin breakdown will have a volume reduction of 30% by week 4 Date Initiated: 09/09/2017 Target Resolution Date: 09/28/2017 Goal Status: Active Interventions: Assess patient/caregiver ability to obtain necessary supplies Assess patient/caregiver ability to perform ulcer/skin care regimen upon admission and as needed Assess ulceration(s) every visit Treatment Activities: Skin care regimen initiated : 09/09/2017 Notes: Electronic Signature(s) Signed: 12/24/2017 5:00:45 PM By: Milus Height, [name] ([id number]) Entered By: Curtis Sites on 12/24/2017 09:35:24 Hollander, [name] ([id number]) -------------------------------------------------------------------------------- Pain Assessment Details Patient Name: HALTON, NEAS L. Date of Service: 12/24/2017 9:15 AM Medical Record Number: [id number] Patient Account Number: [id number] Date of Birth/Sex: 01/31/1951 (67 y.o. M) Treating RN: Curtis Sites Primary Care Laymond Postle: Leotis Shames Other  Clinician: Referring Krishiv Sandler: Leotis Shames Treating Lasaundra Riche/Extender: STONE III, HOYT Weeks in Treatment: 15 Active Problems Location of Pain Severity and Description of Pain Patient Has Paino No Site Locations Pain Management and Medication Current Pain Management: Electronic Signature(s) Signed: 12/24/2017 3:15:29 PM By: Dayton Martes RCP, RRT, CHT Signed: 12/24/2017 5:00:45 PM By: Curtis Sites Entered By: Dayton Martes on 12/24/2017 09:19:30 [name] ([id number]) -------------------------------------------------------------------------------- Patient/Caregiver Education Details Patient Name: RHEN, KAWECKI. Date of Service: 12/24/2017 9:15 AM Medical Record Number: [id number] Patient Account Number: [id number] Date of Birth/Gender: 04-16-1950 (67 y.o. M) Treating RN: Curtis Sites Primary Care Physician: Leotis Shames Other Clinician: Referring Physician: Leotis Shames Treating Physician/Extender: Skeet Simmer in Treatment: 15 Education Assessment Education Provided To: Patient Education Topics Provided Wound/Skin Impairment: Handouts: Other: wound care without using adhesive Methods: Demonstration, Explain/Verbal Responses: State content correctly Electronic Signature(s) Signed: 12/24/2017 4:16:12 PM By: Renne Crigler Entered By: Renne Crigler on 12/24/2017 09:53:53 Newhouse, [name] ([id number]) -------------------------------------------------------------------------------- Wound Assessment Details Patient Name: VANCE, HOCHMUTH L. Date of Service: 12/24/2017 9:15 AM Medical Record Number: [id number] Patient Account Number: [id number] Date of Birth/Sex: 27-Jul-1950 (67 y.o. M) Treating RN: Huel Coventry Primary Care Elon Eoff: Leotis Shames Other Clinician: Referring Martisha Toulouse: Leotis Shames Treating Taryne Kiger/Extender: STONE III, HOYT Weeks in Treatment: 15 Wound Status Wound Number: 1 Primary Diabetic  Wound/Ulcer of the  Lower Etiology: Extremity Wound Location: Left, Lateral Lower Leg Wound Status: Open Wounding Event: Gradually Appeared Date Acquired: 08/26/2017 Weeks Of Treatment: 15 Clustered Wound: No Photos Photo Uploaded By: Elliot Gurney, BSN, RN, CWS, Kim on 12/24/2017 16:09:39 Wound Measurements Length: (cm) 0.3 Width: (cm) 0.2 Depth: (cm) 0.1 Area: (cm) 0.047 Volume: (cm) 0.005 % Reduction in Area: 98.5% % Reduction in Volume: 98.4% Wound Description Classification: Grade 1 Periwound Skin Texture Texture Color No Abnormalities Noted: No No Abnormalities Noted: No Moisture No Abnormalities Noted: No Treatment Notes Wound #1 (Left, Lateral Lower Leg) 1. Cleansed with: Clean wound with Normal Saline 2. Anesthetic Topical Lidocaine 4% cream to wound bed prior to debridement 4. Dressing Applied: ROD, MAJERUS ([id number]) Prisma Ag 5. Secondary Dressing Applied Non-Adherent pad Notes kerlix and Theatre manager) Signed: 12/24/2017 4:33:57 PM By: Elliot Gurney, BSN, RN, CWS, Kim RN, BSN Entered By: Elliot Gurney, BSN, RN, CWS, Kim on 12/24/2017 09:27:36 Wyly, [name] ([id number]) -------------------------------------------------------------------------------- Wound Assessment Details Patient Name: SYLAS, TWOMBLY. Date of Service: 12/24/2017 9:15 AM Medical Record Number: [id number] Patient Account Number: [id number] Date of Birth/Sex: 1951/01/24 (67 y.o. M) Treating RN: Huel Coventry Primary Care Velita Quirk: Leotis Shames Other Clinician: Referring Annsleigh Dragoo: Leotis Shames Treating Lisa Milian/Extender: STONE III, HOYT Weeks in Treatment: 15 Wound Status Wound Number: 2 Primary Etiology: Trauma, Other Wound Location: Left, Proximal Lower Leg Wound Status: Open Wounding Event: Trauma Date Acquired: 12/12/2017 Weeks Of Treatment: 1 Clustered Wound: No Photos Photo Uploaded By: Elliot Gurney, BSN, RN, CWS, Kim on 12/24/2017 16:18:59 Wound Measurements Length: (cm)  0.4 Width: (cm) 0.2 Depth: (cm) 0.1 Area: (cm) 0.063 Volume: (cm) 0.006 % Reduction in Area: 61.8% % Reduction in Volume: 62.5% Wound Description Classification: Partial Thickness Periwound Skin Texture Texture Color No Abnormalities Noted: No No Abnormalities Noted: No Moisture No Abnormalities Noted: No Treatment Notes Wound #2 (Left, Proximal Lower Leg) 1. Cleansed with: Clean wound with Normal Saline 2. Anesthetic Topical Lidocaine 4% cream to wound bed prior to debridement 4. Dressing Applied: NYEEM, STOKE ([id number]) Prisma Ag 5. Secondary Dressing Applied Non-Adherent pad Notes kerlix and Theatre manager) Signed: 12/24/2017 4:33:57 PM By: Elliot Gurney, BSN, RN, CWS, Kim RN, BSN Entered By: Elliot Gurney, BSN, RN, CWS, Kim on 12/24/2017 09:27:37 [name] ([id number]) -------------------------------------------------------------------------------- Vitals Details Patient Name: TAIM, WURM. Date of Service: 12/24/2017 9:15 AM Medical Record Number: [id number] Patient Account Number: [id number] Date of Birth/Sex: 13-Aug-1950 (67 y.o. M) Treating RN: Curtis Sites Primary Care Rihaan Barrack: Leotis Shames Other Clinician: Referring Darnella Zeiter: Leotis Shames Treating Nyellie Yetter/Extender: STONE III, HOYT Weeks in Treatment: 15 Vital Signs Time Taken: 09:20 Temperature (F): 97.9 Height (in): 72 Pulse (bpm): 64 Weight (lbs): 323.9 Respiratory Rate (breaths/min): 16 Body Mass Index (BMI): 43.9 Blood Pressure (mmHg): 142/72 Reference Range: 80 - 120 mg / dl Electronic Signature(s) Signed: 12/24/2017 3:15:29 PM By: Dayton Martes RCP, RRT, CHT Entered By: Weyman Rodney, Lucio Edward on 12/24/2017 09:22:47

## 2017-12-25 NOTE — Progress Notes (Signed)
CASE, VASSELL ([id number]) Visit Report for 12/24/2017 Chief Complaint Document Details Patient Name: [name], [name]. Date of Service: 12/24/2017 9:15 AM Medical Record Number: [id number] Patient Account Number: [id number] Date of Birth/Sex: 26-Sep-1950 (67 y.o. M) Treating RN: Curtis Sites Primary Care Provider: Leotis Shames Other Clinician: Referring Provider: Leotis Shames Treating Provider/Extender: Linwood Dibbles, HOYT Weeks in Treatment: 15 Information Obtained from: Patient Chief Complaint Left leg ulcer Electronic Signature(s) Signed: 12/24/2017 9:56:08 AM By: Lenda Kelp PA-C Entered By: Lenda Kelp on 12/24/2017 09:32:45 [place], [name] ([id number]) -------------------------------------------------------------------------------- Debridement Details Patient Name: [name], [name] Date of Service: 12/24/2017 9:15 AM Medical Record Number: [id number] Patient Account Number: [id number] Date of Birth/Sex: [birth year] (67 y.o. M) Treating RN: Curtis Sites Primary Care Provider: Leotis Shames Other Clinician: Referring Provider: Leotis Shames Treating Provider/Extender: STONE III, HOYT Weeks in Treatment: 15 Debridement Performed for Wound #1 Left,Lateral Lower Leg Assessment: Performed By: Physician STONE III, HOYT E., PA-C Debridement Type: Debridement Severity of Tissue Pre Fat layer exposed Debridement: Level of Consciousness (Pre- Awake and Alert procedure): Pre-procedure Verification/Time Yes - 09:34 Out Taken: Start Time: 09:34 Pain Control: Lidocaine 4% Topical Solution Total Area Debrided (L x W): 0.3 (cm) x 0.2 (cm) = 0.06 (cm) Tissue and other material Viable, Non-Viable, Eschar, Slough, Subcutaneous, Fibrin/Exudate, Slough debrided: Level: Skin/Subcutaneous Tissue Debridement Description: Excisional Instrument: Curette Bleeding: None End Time: 09:38 Procedural Pain: 0 Post Procedural Pain: 0 Response to Treatment: Procedure was  tolerated well Level of Consciousness Awake and Alert (Post-procedure): Post Debridement Measurements of Total Wound Length: (cm) 0.3 Width: (cm) 0.2 Depth: (cm) 0.1 Volume: (cm) 0.005 Character of Wound/Ulcer Post Debridement: Improved Severity of Tissue Post Debridement: Fat layer exposed Post Procedure Diagnosis Same as Pre-procedure Electronic Signature(s) Signed: 12/24/2017 9:56:08 AM By: Lenda Kelp PA-C Signed: 12/24/2017 5:00:45 PM By: Curtis Sites Entered By: Curtis Sites on 12/24/2017 09:38:46 Coury, [name] ([id number]) -------------------------------------------------------------------------------- HPI Details Patient Name: [name], [name] Date of Service: 12/24/2017 9:15 AM Medical Record Number: [id number] Patient Account Number: [id number] Date of Birth/Sex: Jul 14, 1950 (67 y.o. M) Treating RN: Curtis Sites Primary Care Provider: Leotis Shames Other Clinician: Referring Provider: Leotis Shames Treating Provider/Extender: STONE III, HOYT Weeks in Treatment: 15 History of Present Illness HPI Description: MYASTHENIA GRAVIS (MG) MEDICATION ALERT: Medications that increase weakness in most MG patients should be avoided or used only with great caution. These include: - Fluoroquinolone antibiotics: moxifloxacin (Aveloxo), ciprofloxacin (Ciproo, Proquin XRo), norfloxacin (Noroxino), levofloxacin (Levaquino), ofloxacin (Floxino), gemifloxacin (Factiveo) - Aminoglycoside antibiotics: tobramycin, gentamycin, kanamycin, neomycin, streptomycin - Macrolide antibiotics: erythromycin, azithromycin (Z-Pako, Zithromaxo), telithromycin (Keteko) - Botulinum toxins: Botoxo, Myobloco, Dysporto, Xeomino - Beta-blockers such as propanolol or timolol eyedrops (Timoptico) - Calcium channel blockers (blood pressure medications) - Quinine, quinidine or procainamide - D-penicillamine (do not confuse with penicillin) - Interferon - Magnesium salts: milk of magnesia, some  antacids (Maaloxo, Mylantao) - Curare and related drugs (usually used only during surgery) Other medications may produce problems in some MG patients. Be sure your doctor/dentist knows you have MG when any new medication is prescribed. Contact your myasthenia doctor if you or your local doctor has questions about medications. 09/09/17 on evaluation today patient actually appears for initial evaluation today concerning his left lateral lower extremity ulcer which has been present he tells me since November 2018. He states that gas logs that he'd forgotten he had said somewhere actually fell over landing on his leg causing the injury and unfortunately the left leg has never fully recovered since that  time. He does note that there was some improvement initially and now it's pretty much stagnant. He does have venous stasis chronically although he states it's been worse and pass compared to current. He has also been diagnosed with lymphedema although again this is something else that has been deemed the worst in the past compared to present. He does have lymphedema pumps although he really has not been using those on a regular basis. Patient does have diabetes mellitus type II in other than the venous insufficiency and prayerful vascular disease he also has myasthenia gravis board she is on medications long-term. Currently he has been tolerating the medications for this very well he does have a list as noted above in the HPI of medications that he is to avoid that we need to keep in mind. Nonetheless the patient seems to be doing fairly well in general. I do believe his peripheral edema may be contributing to the nonhealing factor in regard to this ulcer. 09/16/17 on evaluation today patient appears to be doing rather well his wound actually appears to show signs of improvement compared to previous evaluation which is great news. Overall his wound is a little larger but again I did debride the  wound sharply last week so this is definitely expected as far as I was concerned. Nonetheless the wound bed itself seems to be doing excellent. He is having no discomfort normally he does have a little bit of pain with cleansing of the wound by myself today. 09/23/17 on evaluation today patient actually appears to be showing signs of good improvement in regard to his left lateral motion the ulcer. He's been tolerating the dressing changes without complication. Fortunately there does not appear to be any evidence of infection at this point which is great news. Overall I'm extremely happy that he seems to be doing so well and the patient likewise wants to be healed as soon as possible he actually has a pull at home he would love to be in but he hasn't been able to get into this due to what's going on currently. 10/01/16 on evaluation today patient appears to be doing rather well in regard to his left lateral lower Trinity ulcer. He's been tolerating the dressing changes without complication. There does not appear to be any evidence of infection at this time. Overall I'm very pleased with the progress that has been made up to this point. I do believe after today will likely be able to switch to a different dressing. CATO, LIBURD ([id number]) 10/08/17 on evaluation today patient actually appears to be doing very well at this point in time. His wound is showing signs of progress which is good news I am not even having to debride as much away as far as the slough is concerned. He does have some Slough which requires debridement at this point however. No fevers, chills, nausea, or vomiting noted at this time. 10/15/17 on evaluation today patient presents for follow-up concerning his ongoing lower extremity ulcer of the left lower extremity. He has been tolerating the dressing changes without complication. With that being said he tells me at this point that his wound has not really been causing much in the  way of discomfort. The wound itself seems to be making good progress with a good granular bed now at this point he did have some Slough on the surface of the wound this was minimal. He has good epithelialization from the anterior tourist posterior portion of the wound. 10/22/17 on  evaluation today patient appears to be doing very well in regard to his left lateral lower extremity ulcer. He is shown signs of improvement which is good news. He's been tolerating the dressing changes without complication. In general I think he has done well with the wraps and though he has not made as much progress this week is he has in weeks past I still feel he's making good progress. We will see how things appear in one weeks time. 10/29/17-He is here in follow-up evaluation for a left lower extremity ulcer. He is tolerating dressing changes and voicing no complaints or concerns. We will continue with the same treatment plan, he will return for nurse visit on Monday and follow-up in 2 weeks. 11/12/17 on evaluation today patient's left lateral lower Trinity ulcer appears to be doing very well at this point. He's been tolerating the dressing changes without complication. Fortunately his wounds have been making great progress and specifically the remaining wound has shown signs of new epithelialization and granulation week by week. Nonetheless he still does have a little bit of work to do as far as getting this to completely heal. No fevers, chills, nausea, or vomiting noted at this time. 11/19/17 on evaluation today patient appears to be doing fairly well at this point in regard to the left lateral lower Trinity ulcer. He has had some improvement since his last visit in the office which is good news. Overall I'm pleased with the progress there's no evidence of infection in general I think things are making great improvement. 12/03/17 on evaluation today patient actually appears to be doing better in regard to his left  lower extremity ulcer. He's been tolerating the dressing changes without complication. With that being said he does seem to be making good progress at this time. Fortunately there is no evidence of infection. He does have some hyper granular tissue which I think has slightly slowed down his progress. Nonetheless I think that this is a very minor setback and I believe that his wound will likely be healed in the next 1-2 weeks. 12/10/17 on evaluation today patient actually appears to be doing much better in regard to the posterior portion of the wound which is filled in quite nicely. That was the deepest area that has been most concerned with their she's taken point opening still remaining. Nonetheless on the main portion of the wound there is a significant amount of dressing material and scab still overlying the wound bed that does not really allow me to see how things are really doing. This had to be removed today. Nonetheless overall there's no evidence of infection but no matter which dressing use this seems to be getting stuck in causing more problems than helping unfortunately. 12/17/17 on evaluation today patient actually appears to be showing signs of improvement at this time. With that being said he does have slow healing of the ulcer at this point he does have diabetes, peripheral vascular disease, and has had a history of myasthenia gravis. Nonetheless I do feel like he's making some progress here and I do believe even since last week the original wound has done very well. 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 Nonetheless this seems to already be shown signs of improvement which is good news. 12/24/17 on evaluation today patient's  left lateral lower extremity ulcer actually appears to be doing a little better in my  pinion in the overall appearance. Unfortunately has a trauma to the lateral part of his calf where he subsequently had a skin tear from the adhesive unfortunately. Nonetheless he has discontinued the use of the adhesive at this time. He is still on 5 mg of prednisone daily although he states that maybe drop down to 2.5 mg shortly. At one point he was actually on 80 mg a day. Nonetheless overall I do feel like that he has made some improvement in regard to the appearance of the wound bed in general. Electronic Signature(s) Signed: 12/24/2017 9:56:08 AM By: Marland Kitchen, [place] ([id number]) Entered By: Lenda Kelp on 12/24/2017 09:47:23 BRASON, BERTHELOT ([id number]) -------------------------------------------------------------------------------- Physical Exam Details Patient Name: DONAVAN, KERLIN L. Date of Service: 12/24/2017 9:15 AM Medical Record Number: [id number] Patient Account Number: [id number] Date of Birth/Sex: 1951/03/07 (67 y.o. M) Treating RN: Curtis Sites Primary Care Provider: Leotis Shames Other Clinician: Referring Provider: Leotis Shames Treating Provider/Extender: STONE III, HOYT Weeks in Treatment: 15 Constitutional Well-nourished and well-hydrated in no acute distress. Respiratory normal breathing without difficulty. Psychiatric this patient is able to make decisions and demonstrates good insight into disease process. Alert and Oriented x 3. pleasant and cooperative. Notes Patient's wound bed did require sharp debridement today to remove slough, [place], and subcutaneous tissue from the surface of the wound which the patient tolerated without complication. Post debridement the wound bed appears to be doing much better which is good news. In regard to the skin tear area which is new this actually seems to be showing signs of healing already there really was not much to do in this regard currently. His swelling seems to be under good control  week with the utilization of his compression stocking. Electronic Signature(s) Signed: 12/24/2017 9:56:08 AM By: Lenda Kelp PA-C Entered By: Lenda Kelp on 12/24/2017 09:48:07 [name] ([id number]) -------------------------------------------------------------------------------- Physician Orders Details Patient Name: JEIDEN, DAUGHTRIDGE. Date of Service: 12/24/2017 9:15 AM Medical Record Number: [id number] Patient Account Number: [id number] Date of Birth/Sex: June 06, 1950 (67 y.o. M) Treating RN: Curtis Sites Primary Care Provider: Leotis Shames Other Clinician: Referring Provider: Leotis Shames Treating Provider/Extender: Linwood Dibbles, HOYT Weeks in Treatment: 15 Verbal / Phone Orders: No Diagnosis Coding ICD-10 Coding Code Description [phone] Non-pressure chronic ulcer of other part of left lower leg with fat layer exposed E11.622 Type 2 diabetes mellitus with other skin ulcer I89.0 Lymphedema, not elsewhere classified G70.00 Myasthenia gravis without (acute) exacerbation I87.2 Venous insufficiency (chronic) (peripheral) I73.9 Peripheral vascular disease, unspecified Wound Cleansing Wound #1 Left,Lateral Lower Leg o Clean wound with Normal Saline. o Cleanse wound with mild soap and water Wound #2 Left,Proximal Lower Leg o Clean wound with Normal Saline. o Cleanse wound with mild soap and water Anesthetic (add to Medication List) Wound #1 Left,Lateral Lower Leg o Topical Lidocaine 4% cream applied to wound bed prior to debridement (In Clinic Only). Wound #2 Left,Proximal Lower Leg o Topical Lidocaine 4% cream applied to wound bed prior to debridement (In Clinic Only). Skin Barriers/Peri-Wound Care Wound #1 Left,Lateral Lower Leg o Moisturizing lotion Wound #2 Left,Proximal Lower Leg o Moisturizing lotion Primary Wound Dressing Wound #1 Left,Lateral Lower Leg o Silver Collagen Wound #2 Left,Proximal Lower Leg o Silver Collagen Secondary  Dressing Wound #1 Left,Lateral Lower Leg Sisler, Clevester L. ([id number]) o Kerlix and Coban - lightly just to secure without using adhesive Wound #2 Left,Proximal  Lower Leg o Kerlix and Coban - lightly just to secure without using adhesive Dressing Change Frequency Wound #1 Left,Lateral Lower Leg o Change dressing every other day. Wound #2 Left,Proximal Lower Leg o Change dressing every other day. Follow-up Appointments Wound #1 Left,Lateral Lower Leg o Return Appointment in 2 weeks. Wound #2 Left,Proximal Lower Leg o Return Appointment in 2 weeks. Edema Control Wound #1 Left,Lateral Lower Leg o Patient to wear own compression stockings o Elevate legs to the level of the heart and pump ankles as often as possible o Compression Pump: Use compression pump on left lower extremity for 30 minutes, twice daily. - work up to 1 hour twice daily o Compression Pump: Use compression pump on right lower extremity for 30 minutes, twice daily. - work up to 1 hour twice daily Wound #2 Left,Proximal Lower Leg o Patient to wear own compression stockings o Elevate legs to the level of the heart and pump ankles as often as possible o Compression Pump: Use compression pump on left lower extremity for 30 minutes, twice daily. - work up to 1 hour twice daily o Compression Pump: Use compression pump on right lower extremity for 30 minutes, twice daily. - work up to 1 hour twice daily Electronic Signature(s) Signed: 12/24/2017 9:56:08 AM By: Lenda Kelp PA-C Signed: 12/24/2017 5:00:45 PM By: Curtis Sites Entered By: Curtis Sites on 12/24/2017 09:39:39 [place]ck, [name] ([id number]) -------------------------------------------------------------------------------- Problem List Details Patient Name: CLENT, DAMORE L. Date of Service: 12/24/2017 9:15 AM Medical Record Number: [id number] Patient Account Number: [id number] Date of Birth/Sex: 05/24/1950 (67 y.o. M) Treating  RN: Curtis Sites Primary Care Provider: Leotis Shames Other Clinician: Referring Provider: Leotis Shames Treating Provider/Extender: Linwood Dibbles, HOYT Weeks in Treatment: 15 Active Problems ICD-10 Evaluated Encounter Code Description Active Date Today Diagnosis 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 of other part of left lower leg with 09/09/2017 No Yes fat layer exposed E11.622 Type 2 diabetes mellitus with other skin ulcer 09/09/2017 No Yes I89.0 Lymphedema, not elsewhere classified 09/09/2017 No Yes G70.00 Myasthenia gravis without (acute) exacerbation 09/09/2017 No Yes I87.2 Venous insufficiency (chronic) (peripheral) 09/09/2017 No Yes I73.9 Peripheral vascular disease, unspecified 09/09/2017 No Yes Inactive Problems Resolved Problems Electronic Signature(s) Signed: 12/24/2017 9:56:08 AM By: Lenda Kelp PA-C Entered By: Lenda Kelp on 12/24/2017 09:32:40 [place], [name] ([id number]) -------------------------------------------------------------------------------- Progress Note Details Patient Name: [name]. Date of Service: 12/24/2017 9:15 AM Medical Record Number: [id number] Patient Account Number: [id number] Date of Birth/Sex: 02/09/1951 (67 y.o. M) Treating RN: Curtis Sites Primary Care Provider: Leotis Shames Other Clinician: Referring Provider: Leotis Shames Treating Provider/Extender: STONE III, HOYT Weeks in Treatment: 15 Subjective Chief Complaint Information obtained from Patient Left leg ulcer History of Present Illness (HPI) MYASTHENIA GRAVIS (MG) MEDICATION ALERT: Medications that increase weakness in most MG patients should be avoided or used only with great caution. These include: - Fluoroquinolone antibiotics: moxifloxacin (Avelox), ciprofloxacin (Cipro, Proquin XR), norfloxacin (Noroxin), levofloxacin (Levaquin), ofloxacin (Floxin), gemifloxacin (Factive) - Aminoglycoside antibiotics: tobramycin, gentamycin, kanamycin, neomycin,  streptomycin - Macrolide antibiotics: erythromycin, azithromycin (Z-Pak, Zithromax), telithromycin (Ketek) - Botulinum toxins: Botox, Myobloc, Dysport, Xeomin - Beta-blockers such as propanolol or timolol eyedrops (Timoptic) - Calcium channel blockers (blood pressure medications) - Quinine, quinidine or procainamide - D-penicillamine (do not confuse with penicillin) - Interferon - Magnesium salts: milk of magnesia, some antacids (Maalox, Mylanta) - Curare and related drugs (usually used only during surgery) Other medications may produce problems in some MG patients. Be sure your doctor/dentist knows you have MG  when any new medication is prescribed. Contact your myasthenia doctor if you or your local doctor has questions about medications. 09/09/17 on evaluation today patient actually appears for initial evaluation today concerning his left lateral lower extremity ulcer which has been present he tells me since November 2018. He states that gas logs that he'd forgotten he had said somewhere actually fell over landing on his leg causing the injury and unfortunately the left leg has never fully recovered since that time. He does note that there was some improvement initially and now it's pretty much stagnant. He does have venous stasis chronically although he states it's been worse and pass compared to current. He has also been diagnosed with lymphedema although again this is something else that has been deemed the worst in the past compared to present. He does have lymphedema pumps although he really has not been using those on a regular basis. Patient does have diabetes mellitus type II in other than the venous insufficiency and prayerful vascular disease he also has myasthenia gravis board she is on medications long-term. Currently he has been tolerating the medications for this very well he does have a list as noted above in the HPI of medications that he is to avoid that we  need to keep in mind. Nonetheless the patient seems to be doing fairly well in general. I do believe his peripheral edema may be contributing to the nonhealing factor in regard to this ulcer. 09/16/17 on evaluation today patient appears to be doing rather well his wound actually appears to show signs of improvement compared to previous evaluation which is great news. Overall his wound is a little larger but again I did debride the wound sharply last week so this is definitely expected as far as I was concerned. Nonetheless the wound bed itself seems to be doing excellent. He is having no discomfort normally he does have a little bit of pain with cleansing of the wound by myself today. 09/23/17 on evaluation today patient actually appears to be showing signs of good improvement in regard to his left lateral motion the ulcer. He's been tolerating the dressing changes without complication. Fortunately there does not appear to be any evidence of infection at this point which is great news. Overall I'm extremely happy that he seems to be doing so well and the patient likewise wants to be healed as soon as possible he actually has a pull at home he would love to be in but he hasn't JERELL, DEMERY L. ([id number]) been able to get into this due to what's going on currently. 10/01/16 on evaluation today patient appears to be doing rather well in regard to his left lateral lower Trinity ulcer. He's been tolerating the dressing changes without complication. There does not appear to be any evidence of infection at this time. Overall I'm very pleased with the progress that has been made up to this point. I do believe after today will likely be able to switch to a different dressing. 10/08/17 on evaluation today patient actually appears to be doing very well at this point in time. His wound is showing signs of progress which is good news I am not even having to debride as much away as far as the slough is concerned.  He does have some Slough which requires debridement at this point however. No fevers, chills, nausea, or vomiting noted at this time. 10/15/17 on evaluation today patient presents for follow-up concerning his ongoing lower extremity ulcer of the left lower  extremity. He has been tolerating the dressing changes without complication. With that being said he tells me at this point that his wound has not really been causing much in the way of discomfort. The wound itself seems to be making good progress with a good granular bed now at this point he did have some Slough on the surface of the wound this was minimal. He has good epithelialization from the anterior tourist posterior portion of the wound. 10/22/17 on evaluation today patient appears to be doing very well in regard to his left lateral lower extremity ulcer. He is shown signs of improvement which is good news. He's been tolerating the dressing changes without complication. In general I think he has done well with the wraps and though he has not made as much progress this week is he has in weeks past I still feel he's making good progress. We will see how things appear in one weeks time. 10/29/17-He is here in follow-up evaluation for a left lower extremity ulcer. He is tolerating dressing changes and voicing no complaints or concerns. We will continue with the same treatment plan, he will return for nurse visit on Monday and follow-up in 2 weeks. 11/12/17 on evaluation today patient's left lateral lower Trinity ulcer appears to be doing very well at this point. He's been tolerating the dressing changes without complication. Fortunately his wounds have been making great progress and specifically the remaining wound has shown signs of new epithelialization and granulation week by week. Nonetheless he still does have a little bit of work to do as far as getting this to completely heal. No fevers, chills, nausea, or vomiting noted at  this time. 11/19/17 on evaluation today patient appears to be doing fairly well at this point in regard to the left lateral lower Trinity ulcer. He has had some improvement since his last visit in the office which is good news. Overall I'm pleased with the progress there's no evidence of infection in general I think things are making great improvement. 12/03/17 on evaluation today patient actually appears to be doing better in regard to his left lower extremity ulcer. He's been tolerating the dressing changes without complication. With that being said he does seem to be making good progress at this time. Fortunately there is no evidence of infection. He does have some hyper granular tissue which I think has slightly slowed down his progress. Nonetheless I think that this is a very minor setback and I believe that his wound will likely be healed in the next 1-2 weeks. 12/10/17 on evaluation today patient actually appears to be doing much better in regard to the posterior portion of the wound which is filled in quite nicely. That was the deepest area that has been most concerned with their she's taken point opening still remaining. Nonetheless on the main portion of the wound there is a significant amount of dressing material and scab still overlying the wound bed that does not really allow me to see how things are really doing. This had to be removed today. Nonetheless overall there's no evidence of infection but no matter which dressing use this seems to be getting stuck in causing more problems than helping unfortunately. 12/17/17 on evaluation today patient actually appears to be showing signs of improvement at this time. With that being said he does have slow healing of the ulcer at this point he does have diabetes, peripheral vascular disease, and has had a history of myasthenia gravis. Nonetheless I do feel  like he's making some progress here and I do believe even since last week the original  wound has done very well. 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 Nonetheless this seems to already be shown signs of improvement which is good news. 12/24/17 on evaluation today patient's left lateral lower extremity ulcer actually appears to be doing a little better in my pinion in the overall appearance. Unfortunately has a trauma to the lateral part of his calf where he subsequently had a skin tear from the adhesive unfortunately. Nonetheless he has discontinued the use of the adhesive at this time. He is still on 5 mg of prednisone daily although he states that maybe drop down to 2.5 mg shortly. At one point he was actually on 80 mg a day. Nonetheless overall I do feel like that he has made some improvement in regard to the appearance of the wound bed in [place], Ivin L. ([id number]) general. Patient History Information obtained from Patient. Family History Cancer - Father, Heart Disease - Father, No family history of Diabetes, Hereditary Spherocytosis, Hypertension, Kidney Disease, Lung Disease, Seizures, Stroke, Thyroid Problems, Tuberculosis. Social History Never smoker, Marital Status - Married, Alcohol Use - Never, Drug Use - No History, Caffeine Use - Daily. Review of Systems (ROS) Constitutional Symptoms (General Health) Denies complaints or symptoms of Fever, Chills. Respiratory The patient has no complaints or symptoms. Cardiovascular The patient has no complaints or symptoms. Psychiatric The patient has no complaints or symptoms. Objective Constitutional Well-nourished and well-hydrated in no acute distress. Vitals Time Taken: 9:20 AM, Height: 72 in, Weight: 323.9 lbs, BMI: 43.9, Temperature: 97.9 F, Pulse: 64 bpm, Respiratory Rate: 16 breaths/min, Blood Pressure: 142/72  mmHg. Respiratory normal breathing without difficulty. Psychiatric this patient is able to make decisions and demonstrates good insight into disease process. Alert and Oriented x 3. pleasant and cooperative. General Notes: Patient's wound bed did require sharp debridement today to remove slough, [place], and subcutaneous tissue from the surface of the wound which the patient tolerated without complication. Post debridement the wound bed appears to be doing much better which is good news. In regard to the skin tear area which is new this actually seems to be showing signs of healing already there really was not much to do in this regard currently. His swelling seems to be under good control week with the utilization of his compression stocking. Integumentary (Hair, Skin) Wound #1 status is Open. Original cause of wound was Gradually Appeared. The wound is located on the Left,Lateral Lower Leg. The wound measures 0.3cm length x 0.2cm width x 0.1cm depth; 0.047cm^2 area and 0.005cm^3 volume. BRYSE, BLANCHETTE ([id number]) Wound #2 status is Open. Original cause of wound was Trauma. The wound is located on the Left,Proximal Lower Leg. The wound measures 0.4cm length x 0.2cm width x 0.1cm depth; 0.063cm^2 area and 0.006cm^3 volume. 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 There was a Excisional  Skin/Subcutaneous Tissue Debridement with a total area of 0.06 sq cm performed by STONE III, HOYT E., PA-C. With the  following instrument(s): Curette to remove Viable and Non-Viable tissue/material. Material removed includes Eschar, Subcutaneous Tissue, Slough, and Fibrin/Exudate after achieving pain control using Lidocaine 4% Topical Solution. A time out was conducted at 09:34, prior to the start of the procedure. There was no bleeding. The procedure was tolerated well with a pain level of 0 throughout and a pain level of 0 following the procedure. Post Debridement Measurements: 0.3cm length x 0.2cm width x 0.1cm depth; 0.005cm^3 volume. Character of Wound/Ulcer Post Debridement is improved. Severity of Tissue Post Debridement is: Fat layer exposed. Post procedure Diagnosis Wound #1: Same as Pre-Procedure Plan Wound Cleansing: Wound #1 Left,Lateral Lower Leg: Clean wound with Normal Saline. Cleanse wound with mild soap and water Wound #2 Left,Proximal Lower Leg: Clean wound with Normal Saline. Cleanse wound with mild soap and water Anesthetic (add to Medication List): Wound #1 Left,Lateral Lower Leg: Topical Lidocaine 4% cream applied to wound bed prior to debridement (In Clinic Only). Wound #2 Left,Proximal Lower Leg: Topical Lidocaine 4% cream applied to wound bed prior to debridement (In Clinic Only). Skin Barriers/Peri-Wound Care: Wound #1 Left,Lateral Lower Leg: KAMARIUS, BUCKBEE ([id number]) Moisturizing lotion Wound #2 Left,Proximal Lower Leg: Moisturizing lotion Primary Wound Dressing: Wound #1 Left,Lateral Lower Leg: Silver Collagen Wound #2 Left,Proximal Lower Leg: Silver Collagen Secondary Dressing: Wound #1 Left,Lateral Lower Leg: Kerlix and Coban - lightly just to secure without using adhesive Wound #2 Left,Proximal Lower Leg: Kerlix and Coban - lightly just to secure without using adhesive Dressing Change Frequency: Wound #1 Left,Lateral Lower Leg: Change dressing every other day. Wound #2 Left,Proximal Lower Leg: Change dressing every other day. Follow-up  Appointments: Wound #1 Left,Lateral Lower Leg: Return Appointment in 2 weeks. Wound #2 Left,Proximal Lower Leg: Return Appointment in 2 weeks. Edema Control: Wound #1 Left,Lateral Lower Leg: Patient to wear own compression stockings Elevate legs to the level of the heart and pump ankles as often as possible Compression Pump: Use compression pump on left lower extremity for 30 minutes, twice daily. - work up to 1 hour twice daily Compression Pump: Use compression pump on right lower extremity for 30 minutes, twice daily. - work up to 1 hour twice daily Wound #2 Left,Proximal Lower Leg: Patient to wear own compression stockings Elevate legs to the level of the heart and pump ankles as often as possible Compression Pump: Use compression pump on left lower extremity for 30 minutes, twice daily. - work up to 1 hour twice daily Compression Pump: Use compression pump on right lower extremity for 30 minutes, twice daily. - work up to 1 hour twice daily At this time my suggestion is gonna be that we actually continue with the above wound care measures for the next week. The patient is in agreement with plan. We will subsequently see were things stand at follow-up. Please see above for specific wound care orders. We will see patient for re-evaluation in 2 week(s) here in the clinic. If anything worsens or changes patient will contact our office for additional recommendations. Electronic Signature(s) Signed: 12/24/2017 9:56:08 AM By: Lenda Kelp PA-C Entered By: Lenda Kelp on 12/24/2017 09:48:30 Fano, [name] ([id number]) -------------------------------------------------------------------------------- ROS/PFSH Details Patient Name: CARNELIUS, HAMMITT. Date of Service: 12/24/2017 9:15 AM Medical Record Number: [id number] Patient Account Number: [id number] Date of Birth/Sex: 23-Dec-1950 (67 y.o. M) Treating RN: Curtis Sites Primary Care Provider: Leotis Shames Other  Clinician:  Referring Provider: Leotis Shames Treating Provider/Extender: STONE III, HOYT Weeks in Treatment: 15 Information Obtained From Patient Wound History Do you currently have one or more open woundso Yes How many open wounds do you currently haveo 1 Approximately how long have you had your woundso nov 2018 How have you been treating your wound(s) until nowo band-aide, neo sporinHas your wound(s) ever healed and then re-openedo No Have you had any lab work done in the past montho No Have you tested positive for an antibiotic resistant organism (MRSA, VRE)o No Have you tested positive for osteomyelitis (bone infection)o No Have you had any tests for circulation on your legso No Have you had other problems associated with your woundso Swelling Constitutional Symptoms (General Health) Complaints and Symptoms: Negative for: Fever; Chills Eyes Medical History: Positive for: Cataracts - surgery Hematologic/Lymphatic Medical History: Positive for: Anemia; Lymphedema Respiratory Complaints and Symptoms: No Complaints or Symptoms Cardiovascular Complaints and Symptoms: No Complaints or Symptoms Medical History: Positive for: Hypertension Endocrine Medical History: Positive for: Type II Diabetes Time with diabetes: 15 yrs Treated with: Insulin, Oral agents VONTE, ROSSIN ([id number]) Blood sugar tested every day: Yes Tested : Psychiatric Complaints and Symptoms: No Complaints or Symptoms HBO Extended History Items Eyes: Cataracts Immunizations Pneumococcal Vaccine: Received Pneumococcal Vaccination: Yes Implantable Devices Family and Social History Cancer: Yes - Father; Diabetes: No; Heart Disease: Yes - Father; Hereditary Spherocytosis: No; Hypertension: No; Kidney Disease: No; Lung Disease: No; Seizures: No; Stroke: No; Thyroid Problems: No; Tuberculosis: No; Never smoker; Marital Status - Married; Alcohol Use: Never; Drug Use: No History; Caffeine Use: Daily;  Financial Concerns: No; Food, Clothing or Shelter Needs: No; Support System Lacking: No; Transportation Concerns: No; Advanced Directives: No; Patient does not want information on Advanced Directives; Do not resuscitate: No; Living Will: Yes (Copy provided); Medical Power of Attorney: No Physician Affirmation I have reviewed and agree with the above information. Electronic Signature(s) Signed: 12/24/2017 9:56:08 AM By: Lenda Kelp PA-C Signed: 12/24/2017 5:00:45 PM By: Curtis Sites Entered By: Lenda Kelp on 12/24/2017 09:47:44 Godar, [name] ([id number]) -------------------------------------------------------------------------------- SuperBill Details Patient Name: KIMOTHY, KISHIMOTO. Date of Service: 12/24/2017 Medical Record Number: [id number] Patient Account Number: [id number] Date of Birth/Sex: May 30, 1950 (67 y.o. M) Treating RN: Curtis Sites Primary Care Provider: Leotis Shames Other Clinician: Referring Provider: Leotis Shames Treating Provider/Extender: Linwood Dibbles, HOYT Weeks in Treatment: 15 Diagnosis Coding ICD-10 Codes Code Description [phone] Non-pressure chronic ulcer of other part of left lower leg with fat layer exposed E11.622 Type 2 diabetes mellitus with other skin ulcer I89.0 Lymphedema, not elsewhere classified G70.00 Myasthenia gravis without (acute) exacerbation I87.2 Venous insufficiency (chronic) (peripheral) I73.9 Peripheral vascular disease, unspecified Facility Procedures CPT4 Code Description: [id number] 11042 - DEB SUBQ TISSUE 20 SQ CM/< ICD-10 Diagnosis Description 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 of other part of left lower leg with Modifier: fat layer expos Quantity: 1 ed Physician Procedures CPT4 Code Description: [id number] 11042 - WC PHYS SUBQ TISS 20 SQ CM ICD-10 Diagnosis Description 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 of other part of left lower leg with Modifier: fat layer expos Quantity: 1 ed Electronic Signature(s) Signed:  12/24/2017 9:56:08 AM By: Lenda Kelp PA-C Entered By: Lenda Kelp on 12/24/2017 09:48:44

## 2018-01-07 ENCOUNTER — Encounter: Payer: BLUE CROSS/BLUE SHIELD | Admitting: Physician Assistant

## 2018-01-07 DIAGNOSIS — Z09 Encounter for follow-up examination after completed treatment for conditions other than malignant neoplasm: Secondary | ICD-10-CM | POA: Diagnosis not present

## 2018-01-10 NOTE — Progress Notes (Addendum)
ZANNIE, RUNKLE ([id number]) Visit Report for 01/07/2018 Chief Complaint Document Details Patient Name: [name], [name]. Date of Service: 01/07/2018 9:30 AM Medical Record Number: [id number] Patient Account Number: [id number] Date of Birth/Sex: 27-Nov-1950 (66 y.o. M) Treating RN: Curtis Sites Primary Care Provider: Leotis Shames Other Clinician: Referring Provider: Leotis Shames Treating Provider/Extender: Linwood Dibbles, HOYT Weeks in Treatment: 17 Information Obtained from: Patient Chief Complaint Left leg ulcer Electronic Signature(s) Signed: 01/08/2018 11:13:36 AM By: Lenda Kelp PA-C Entered By: Lenda Kelp on 01/07/2018 09:45:22 Carte, [name] ([id number]) -------------------------------------------------------------------------------- HPI Details Patient Name: [name], [name]. Date of Service: 01/07/2018 9:30 AM Medical Record Number: [id number] Patient Account Number: [id number] Date of Birth/Sex: 06-Jul-1950 (66 y.o. M) Treating RN: Curtis Sites Primary Care Provider: Leotis Shames Other Clinician: Referring Provider: Leotis Shames Treating Provider/Extender: STONE III, HOYT Weeks in Treatment: 17 History of Present Illness HPI Description: MYASTHENIA GRAVIS (MG) MEDICATION ALERT: Medications that increase weakness in most MG patients should be avoided or used only with great caution. These include: - Fluoroquinolone antibiotics: moxifloxacin (Aveloxo), ciprofloxacin (Ciproo, Proquin XRo), norfloxacin (Noroxino), levofloxacin (Levaquino), ofloxacin (Floxino), gemifloxacin (Factiveo) - Aminoglycoside antibiotics: tobramycin, gentamycin, kanamycin, neomycin, streptomycin - Macrolide antibiotics: erythromycin, azithromycin (Z-Pako, Zithromaxo), telithromycin (Keteko) - Botulinum toxins: Botoxo, Myobloco, Dysporto, Xeomino - Beta-blockers such as propanolol or timolol eyedrops (Timoptico) - Calcium channel blockers (blood pressure medications) - Quinine,  quinidine or procainamide - D-penicillamine (do not confuse with penicillin) - Interferon - Magnesium salts: milk of magnesia, some antacids (Maaloxo, Mylantao) - Curare and related drugs (usually used only during surgery) Other medications may produce problems in some MG patients. Be sure your doctor/dentist knows you have MG when any new medication is prescribed. Contact your myasthenia doctor if you or your local doctor has questions about medications. 09/09/17 on evaluation today patient actually appears for initial evaluation today concerning his left lateral lower extremity ulcer which has been present he tells me since November 2018. He states that gas logs that he'd forgotten he had said somewhere actually fell over landing on his leg causing the injury and unfortunately the left leg has never fully recovered since that time. He does note that there was some improvement initially and now it's pretty much stagnant. He does have venous stasis chronically although he states it's been worse and pass compared to current. He has also been diagnosed with lymphedema although again this is something else that has been deemed the worst in the past compared to present. He does have lymphedema pumps although he really has not been using those on a regular basis. Patient does have diabetes mellitus type II in other than the venous insufficiency and prayerful vascular disease he also has myasthenia gravis board she is on medications long-term. Currently he has been tolerating the medications for this very well he does have a list as noted above in the HPI of medications that he is to avoid that we need to keep in mind. Nonetheless the patient seems to be doing fairly well in general. I do believe his peripheral edema may be contributing to the nonhealing factor in regard to this ulcer. 09/16/17 on evaluation today patient appears to be doing rather well his wound actually appears to show signs of  improvement compared to previous evaluation which is great news. Overall his wound is a little larger but again I did debride the wound sharply last week so this is definitely expected as far as I was concerned. Nonetheless the wound bed itself seems to be  doing excellent. He is having no discomfort normally he does have a little bit of pain with cleansing of the wound by myself today. 09/23/17 on evaluation today patient actually appears to be showing signs of good improvement in regard to his left lateral motion the ulcer. He's been tolerating the dressing changes without complication. Fortunately there does not appear to be any evidence of infection at this point which is great news. Overall I'm extremely happy that he seems to be doing so well and the patient likewise wants to be healed as soon as possible he actually has a pull at home he would love to be in but he hasn't been able to get into this due to what's going on currently. 10/01/16 on evaluation today patient appears to be doing rather well in regard to his left lateral lower Trinity ulcer. He's been tolerating the dressing changes without complication. There does not appear to be any evidence of infection at this time. Overall I'm very pleased with the progress that has been made up to this point. I do believe after today will likely be able to switch to a different dressing. [name], [name] ([id number]) 10/08/17 on evaluation today patient actually appears to be doing very well at this point in time. His wound is showing signs of progress which is good news I am not even having to debride as much away as far as the slough is concerned. He does have some Slough which requires debridement at this point however. No fevers, chills, nausea, or vomiting noted at this time. 10/15/17 on evaluation today patient presents for follow-up concerning his ongoing lower extremity ulcer of the left lower extremity. He has been tolerating the dressing  changes without complication. With that being said he tells me at this point that his wound has not really been causing much in the way of discomfort. The wound itself seems to be making good progress with a good granular bed now at this point he did have some Slough on the surface of the wound this was minimal. He has good epithelialization from the anterior tourist posterior portion of the wound. 10/22/17 on evaluation today patient appears to be doing very well in regard to his left lateral lower extremity ulcer. He is shown signs of improvement which is good news. He's been tolerating the dressing changes without complication. In general I think he has done well with the wraps and though he has not made as much progress this week is he has in weeks past I still feel he's making good progress. We will see how things appear in one weeks time. 10/29/17-He is here in follow-up evaluation for a left lower extremity ulcer. He is tolerating dressing changes and voicing no complaints or concerns. We will continue with the same treatment plan, he will return for nurse visit on Monday and follow-up in 2 weeks. 11/12/17 on evaluation today patient's left lateral lower Trinity ulcer appears to be doing very well at this point. He's been tolerating the dressing changes without complication. Fortunately his wounds have been making great progress and specifically the remaining wound has shown signs of new epithelialization and granulation week by week. Nonetheless he still does have a little bit of work to do as far as getting this to completely heal. No fevers, chills, nausea, or vomiting noted at this time. 11/19/17 on evaluation today patient appears to be doing fairly well at this point in regard to the left lateral lower Trinity ulcer. He has had some  improvement since his last visit in the office which is good news. Overall I'm pleased with the progress there's no evidence of infection in general I think  things are making great improvement. 12/03/17 on evaluation today patient actually appears to be doing better in regard to his left lower extremity ulcer. He's been tolerating the dressing changes without complication. With that being said he does seem to be making good progress at this time. Fortunately there is no evidence of infection. He does have some hyper granular tissue which I think has slightly slowed down his progress. Nonetheless I think that this is a very minor setback and I believe that his wound will likely be healed in the next 1-2 weeks. 12/10/17 on evaluation today patient actually appears to be doing much better in regard to the posterior portion of the wound which is filled in quite nicely. That was the deepest area that has been most concerned with their she's taken point opening still remaining. Nonetheless on the main portion of the wound there is a significant amount of dressing material and scab still overlying the wound bed that does not really allow me to see how things are really doing. This had to be removed today. Nonetheless overall there's no evidence of infection but no matter which dressing use this seems to be getting stuck in causing more problems than helping unfortunately. 12/17/17 on evaluation today patient actually appears to be showing signs of improvement at this time. With that being said he does have slow healing of the ulcer at this point he does have diabetes, peripheral vascular disease, and has had a history of myasthenia gravis. Nonetheless I do feel like he's making some progress here and I do believe even since last week the original wound has done very well. 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 Nonetheless this seems to already be shown signs of improvement which is good  news. 12/24/17 on evaluation today patient's left lateral lower extremity ulcer actually appears to be doing a little better in my pinion in the overall appearance. Unfortunately has a trauma to the lateral part of his calf where he subsequently had a skin tear from the adhesive unfortunately. Nonetheless he has discontinued the use of the adhesive at this time. He is still on 5 mg of prednisone daily although he states that maybe drop down to 2.5 mg shortly. At one point he was actually on 80 mg a day. Nonetheless overall I do feel like that he has made some improvement in regard to the appearance of the wound bed in general. 01/07/18 on evaluation today patient actually appears to be healed in regard to his left lateral lower extremity ulcer. He has been tolerating the dressing changes which is great news. Overall I feel like this has shown signs of excellent improvement at this point. [name], [name] ([id number]) Electronic Signature(s) Signed: 01/09/2018 5:08:41 PM By: Lenda Kelp PA-C Entered By: Lenda Kelp on 01/09/2018 11:08:19 [name], [name] ([id number]) -------------------------------------------------------------------------------- Physical Exam Details Patient Name: [name], [name]. Date of Service: 01/07/2018 9:30 AM Medical Record Number: [id number] Patient Account Number: [id number] Date of Birth/Sex: March 10, 1951 (66 y.o. M) Treating RN: Curtis Sites Primary Care Provider: Leotis Shames Other Clinician: Referring Provider: Leotis Shames Treating Provider/Extender: STONE III, HOYT Weeks in Treatment:  17 Constitutional Well-nourished and well-hydrated in no acute distress. Respiratory normal breathing without difficulty. Psychiatric this patient is able to make decisions and demonstrates good insight into disease process. Alert and Oriented x 3. pleasant and cooperative. Notes Patient has no residual opening, evidence of infection, or fluid buildup  underneath the wound site. This appears to be very well healed. Electronic Signature(s) Signed: 01/09/2018 5:08:41 PM By: Lenda Kelp PA-C Entered By: Lenda Kelp on 01/09/2018 11:08:48 [name] ([id number]) -------------------------------------------------------------------------------- Physician Orders Details Patient Name: KAYMEN, ADRIAN. Date of Service: 01/07/2018 9:30 AM Medical Record Number: [id number] Patient Account Number: [id number] Date of Birth/Sex: 07-12-1950 (66 y.o. M) Treating RN: Curtis Sites Primary Care Provider: Leotis Shames Other Clinician: Referring Provider: Leotis Shames Treating Provider/Extender: Linwood Dibbles, HOYT Weeks in Treatment: 17 Verbal / Phone Orders: No Diagnosis Coding ICD-10 Coding Code Description [id number] Non-pressure chronic ulcer of other part of left lower leg with fat layer exposed E11.622 Type 2 diabetes mellitus with other skin ulcer I89.0 Lymphedema, not elsewhere classified G70.00 Myasthenia gravis without (acute) exacerbation I87.2 Venous insufficiency (chronic) (peripheral) I73.9 Peripheral vascular disease, unspecified Discharge From [facility] o Discharge from [facility] Center Electronic Signature(s) Signed: 01/07/2018 5:26:03 PM By: Curtis Sites Signed: 01/08/2018 11:13:36 AM By: Lenda Kelp PA-C Entered By: Curtis Sites on 01/07/2018 09:59:23 Bayles, [name] ([id number]) -------------------------------------------------------------------------------- Problem List Details Patient Name: PRITESH, SOBECKI Wright. Date of Service: 01/07/2018 9:30 AM Medical Record Number: [id number] Patient Account Number: [id number] Date of Birth/Sex: October 17, 1950 (66 y.o. M) Treating RN: Curtis Sites Primary Care Provider: Leotis Shames Other Clinician: Referring Provider: Leotis Shames Treating Provider/Extender: Linwood Dibbles, HOYT Weeks in Treatment: 17 Active Problems ICD-10 Evaluated Encounter Code  Description Active Date Today Diagnosis 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 of other part of left lower leg with 09/09/2017 No Yes fat layer exposed E11.622 Type 2 diabetes mellitus with other skin ulcer 09/09/2017 No Yes I89.0 Lymphedema, not elsewhere classified 09/09/2017 No Yes G70.00 Myasthenia gravis without (acute) exacerbation 09/09/2017 No Yes I87.2 Venous insufficiency (chronic) (peripheral) 09/09/2017 No Yes I73.9 Peripheral vascular disease, unspecified 09/09/2017 No Yes Inactive Problems Resolved Problems Electronic Signature(s) Signed: 01/08/2018 11:13:36 AM By: Lenda Kelp PA-C Entered By: Lenda Kelp on 01/07/2018 09:45:16 Scritchfield, [name] ([id number]) -------------------------------------------------------------------------------- Progress Note Details Patient Name: [name]. Date of Service: 01/07/2018 9:30 AM Medical Record Number: [id number] Patient Account Number: [id number] Date of Birth/Sex: February 20, 1951 (66 y.o. M) Treating RN: Curtis Sites Primary Care Provider: Leotis Shames Other Clinician: Referring Provider: Leotis Shames Treating Provider/Extender: STONE III, HOYT Weeks in Treatment: 17 Subjective Chief Complaint Information obtained from Patient Left leg ulcer History of Present Illness (HPI) MYASTHENIA GRAVIS (MG) MEDICATION ALERT: Medications that increase weakness in most MG patients should be avoided or used only with great caution. These include: - Fluoroquinolone antibiotics: moxifloxacin (Avelox), ciprofloxacin (Cipro, Proquin XR), norfloxacin (Noroxin), levofloxacin (Levaquin), ofloxacin (Floxin), gemifloxacin (Factive) - Aminoglycoside antibiotics: tobramycin, gentamycin, kanamycin, neomycin, streptomycin - Macrolide antibiotics: erythromycin, azithromycin (Z-Pak, Zithromax), telithromycin (Ketek) - Botulinum toxins: Botox, Myobloc, Dysport, Xeomin - Beta-blockers such as propanolol or timolol  eyedrops (Timoptic) - Calcium channel blockers (blood pressure medications) - Quinine, quinidine or procainamide - D-penicillamine (do not confuse with penicillin) - Interferon - Magnesium salts: milk of magnesia, some antacids (Maalox, Mylanta) - Curare and related drugs (usually used only during surgery) Other medications may produce problems in some MG patients. Be sure your doctor/dentist knows you have MG when any new medication is prescribed. Contact your myasthenia  doctor if you or your local doctor has questions about medications. 09/09/17 on evaluation today patient actually appears for initial evaluation today concerning his left lateral lower extremity ulcer which has been present he tells me since November 2018. He states that gas logs that he'd forgotten he had said somewhere actually fell over landing on his leg causing the injury and unfortunately the left leg has never fully recovered since that time. He does note that there was some improvement initially and now it's pretty much stagnant. He does have venous stasis chronically although he states it's been worse and pass compared to current. He has also been diagnosed with lymphedema although again this is something else that has been deemed the worst in the past compared to present. He does have lymphedema pumps although he really has not been using those on a regular basis. Patient does have diabetes mellitus type II in other than the venous insufficiency and prayerful vascular disease he also has myasthenia gravis board she is on medications long-term. Currently he has been tolerating the medications for this very well he does have a list as noted above in the HPI of medications that he is to avoid that we need to keep in mind. Nonetheless the patient seems to be doing fairly well in general. I do believe his peripheral edema may be contributing to the nonhealing factor in regard to this ulcer. 09/16/17 on evaluation today  patient appears to be doing rather well his wound actually appears to show signs of improvement compared to previous evaluation which is great news. Overall his wound is a little larger but again I did debride the wound sharply last week so this is definitely expected as far as I was concerned. Nonetheless the wound bed itself seems to be doing excellent. He is having no discomfort normally he does have a little bit of pain with cleansing of the wound by myself today. 09/23/17 on evaluation today patient actually appears to be showing signs of good improvement in regard to his left lateral motion the ulcer. He's been tolerating the dressing changes without complication. Fortunately there does not appear to be any evidence of infection at this point which is great news. Overall I'm extremely happy that he seems to be doing so well and the patient likewise wants to be healed as soon as possible he actually has a pull at home he would love to be in but he hasn't [name], [name]. ([id number]) been able to get into this due to what's going on currently. 10/01/16 on evaluation today patient appears to be doing rather well in regard to his left lateral lower Trinity ulcer. He's been tolerating the dressing changes without complication. There does not appear to be any evidence of infection at this time. Overall I'm very pleased with the progress that has been made up to this point. I do believe after today will likely be able to switch to a different dressing. 10/08/17 on evaluation today patient actually appears to be doing very well at this point in time. His wound is showing signs of progress which is good news I am not even having to debride as much away as far as the slough is concerned. He does have some Slough which requires debridement at this point however. No fevers, chills, nausea, or vomiting noted at this time. 10/15/17 on evaluation today patient presents for follow-up concerning his ongoing lower  extremity ulcer of the left lower extremity. He has been tolerating the dressing changes without  complication. With that being said he tells me at this point that his wound has not really been causing much in the way of discomfort. The wound itself seems to be making good progress with a good granular bed now at this point he did have some Slough on the surface of the wound this was minimal. He has good epithelialization from the anterior tourist posterior portion of the wound. 10/22/17 on evaluation today patient appears to be doing very well in regard to his left lateral lower extremity ulcer. He is shown signs of improvement which is good news. He's been tolerating the dressing changes without complication. In general I think he has done well with the wraps and though he has not made as much progress this week is he has in weeks past I still feel he's making good progress. We will see how things appear in one weeks time. 10/29/17-He is here in follow-up evaluation for a left lower extremity ulcer. He is tolerating dressing changes and voicing no complaints or concerns. We will continue with the same treatment plan, he will return for nurse visit on Monday and follow-up in 2 weeks. 11/12/17 on evaluation today patient's left lateral lower Trinity ulcer appears to be doing very well at this point. He's been tolerating the dressing changes without complication. Fortunately his wounds have been making great progress and specifically the remaining wound has shown signs of new epithelialization and granulation week by week. Nonetheless he still does have a little bit of work to do as far as getting this to completely heal. No fevers, chills, nausea, or vomiting noted at this time. 11/19/17 on evaluation today patient appears to be doing fairly well at this point in regard to the left lateral lower Trinity ulcer. He has had some improvement since his last visit in the office which is good news. Overall I'm  pleased with the progress there's no evidence of infection in general I think things are making great improvement. 12/03/17 on evaluation today patient actually appears to be doing better in regard to his left lower extremity ulcer. He's been tolerating the dressing changes without complication. With that being said he does seem to be making good progress at this time. Fortunately there is no evidence of infection. He does have some hyper granular tissue which I think has slightly slowed down his progress. Nonetheless I think that this is a very minor setback and I believe that his wound will likely be healed in the next 1-2 weeks. 12/10/17 on evaluation today patient actually appears to be doing much better in regard to the posterior portion of the wound which is filled in quite nicely. That was the deepest area that has been most concerned with their she's taken point opening still remaining. Nonetheless on the main portion of the wound there is a significant amount of dressing material and scab still overlying the wound bed that does not really allow me to see how things are really doing. This had to be removed today. Nonetheless overall there's no evidence of infection but no matter which dressing use this seems to be getting stuck in causing more problems than helping unfortunately. 12/17/17 on evaluation today patient actually appears to be showing signs of improvement at this time. With that being said he does have slow healing of the ulcer at this point he does have diabetes, peripheral vascular disease, and has had a history of myasthenia gravis. Nonetheless I do feel like he's making some progress here and I do  believe even since last week the original wound has done very well. 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  Nonetheless this seems to already be shown signs of improvement which is good news. 12/24/17 on evaluation today patient's left lateral lower extremity ulcer actually appears to be doing a little better in my pinion in the overall appearance. Unfortunately has a trauma to the lateral part of his calf where he subsequently had a skin tear from the adhesive unfortunately. Nonetheless he has discontinued the use of the adhesive at this time. He is still on 5 mg of prednisone daily although he states that maybe drop down to 2.5 mg shortly. At one point he was actually on 80 mg a day. Nonetheless overall I do feel like that he has made some improvement in regard to the appearance of the wound bed in [name], [name]. ([id number]) general. 01/07/18 on evaluation today patient actually appears to be healed in regard to his left lateral lower extremity ulcer. He has been tolerating the dressing changes which is great news. Overall I feel like this has shown signs of excellent improvement at this point. Patient History Information obtained from Patient. Family History Cancer - Father, Heart Disease - Father, No family history of Diabetes, Hereditary Spherocytosis, Hypertension, Kidney Disease, Lung Disease, Seizures, Stroke, Thyroid Problems, Tuberculosis. Social History Never smoker, Marital Status - Married, Alcohol Use - Never, Drug Use - No History, Caffeine Use - Daily. Review of Systems (ROS) Constitutional Symptoms (General Health) Denies complaints or symptoms of Fever, Chills. Respiratory The patient has no complaints or symptoms. Cardiovascular The patient has no complaints or symptoms. Psychiatric The patient has no complaints or symptoms. Objective Constitutional Well-nourished and well-hydrated in no acute distress. Vitals Time Taken: 9:33 AM, Height: 72 in, Weight: 323.9 lbs, BMI: 43.9, Temperature: 98.0 F, Pulse: 62 bpm, Respiratory Rate: 16 breaths/min, Blood Pressure: 144/68  mmHg. Respiratory normal breathing without difficulty. Psychiatric this patient is able to make decisions and demonstrates good insight into disease process. Alert and Oriented x 3. pleasant and cooperative. General Notes: Patient has no residual opening, evidence of infection, or fluid buildup underneath the wound site. This appears to be very well healed. Integumentary (Hair, Skin) Wound #1 status is Healed - Epithelialized. Original cause of wound was Gradually Appeared. The wound is located on the Left,Lateral Lower Leg. The wound measures 0cm length x 0cm width x 0cm depth; 0cm^2 area and 0cm^3 volume. There is [name], [name]. ([id number]) no tunneling or undermining noted. There is a none present amount of drainage noted. There is no granulation within the wound bed. There is no necrotic tissue within the wound bed. The periwound skin appearance had no abnormalities noted for texture. The periwound skin appearance did not exhibit: Dry/Scaly, Maceration, Atrophie Blanche, Cyanosis, Ecchymosis, Hemosiderin Staining, Mottled, Pallor, Rubor, Erythema. Periwound temperature was noted as No Abnormality. Wound #2 status is Healed - Epithelialized. Original cause of wound was Trauma. The wound is located on the Left,Proximal Lower Leg. The wound measures 0cm length x 0cm width x 0cm depth; 0cm^2 area and 0cm^3 volume. There is no tunneling or undermining noted. There is a none present amount of drainage noted. There is no granulation within the wound bed. The periwound skin appearance had no abnormalities noted for texture. The periwound skin  appearance had no abnormalities noted for color. The periwound skin appearance did not exhibit: Dry/Scaly, Maceration. 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 If anything changes or worsens the meantime he will contact the office and let us know. Electronic Signature(s) Signed: 01/09/2018 5:08:41 PM By: Lenda Kelp PA-C Entered By: Lenda Kelp on 01/09/2018 11:09:11 [name], KASPAREK ([id number]) -------------------------------------------------------------------------------- ROS/PFSH Details Patient Name: [name], [name]. Date of Service: 01/07/2018 9:30 AM Medical Record Number: [id number] Patient Account Number: [id number] Date of Birth/Sex: 10/09/1950 (66 y.o. M) Treating RN: Curtis Sites Primary Care Provider: Leotis Shames Other Clinician: Referring Provider: Leotis Shames Treating Provider/Extender: STONE III, HOYT Weeks in Treatment: 17 Information Obtained From Patient Wound History Do you currently have one or more open woundso Yes How many open wounds do you currently haveo 1 Approximately how long have you had your woundso nov 2018 How have you been treating your wound(s) until nowo band-aide, neo sporinHas your wound(s) ever healed and then re-openedo No Have you had any lab work done in the past montho No Have you tested positive for an antibiotic resistant organism (MRSA, VRE)o No Have you tested positive for osteomyelitis (bone infection)o No Have you had any tests for circulation on your legso No Have you had other problems associated with your woundso Swelling Constitutional Symptoms (General Health) Complaints and Symptoms: Negative for: Fever; Chills Eyes Medical History: Positive for: Cataracts - surgery Hematologic/Lymphatic Medical History: Positive for: Anemia;  Lymphedema Respiratory Complaints and Symptoms: No Complaints or Symptoms Cardiovascular Complaints and Symptoms: No Complaints or Symptoms Medical History: Positive for: Hypertension Endocrine Medical History: Positive for: Type II Diabetes Time with diabetes: 15 yrs Treated with: Insulin, Oral agents [name], [name] ([id number]) Blood sugar tested every day: Yes Tested : Psychiatric Complaints and Symptoms: No Complaints or Symptoms HBO Extended History Items Eyes: Cataracts Immunizations Pneumococcal Vaccine: Received Pneumococcal Vaccination: Yes Implantable Devices Family and Social History Cancer: Yes - Father; Diabetes: No; Heart Disease: Yes - Father; Hereditary Spherocytosis: No; Hypertension: No; Kidney Disease: No; Lung Disease: No; Seizures: No; Stroke: No; Thyroid Problems: No; Tuberculosis: No; Never smoker; Marital Status - Married; Alcohol Use: Never; Drug Use: No History; Caffeine Use: Daily; Financial Concerns: No; Food, Clothing or Shelter Needs: No; Support System Lacking: No; Transportation Concerns: No; Advanced Directives: No; Patient does not want information on Advanced Directives; Do not resuscitate: No; Living Will: Yes (Copy provided); Medical Power of Attorney: No Physician Affirmation I have reviewed and agree with the above information. Electronic Signature(s) Signed: 01/09/2018 5:08:41 PM By: Lenda Kelp PA-C Signed: 01/09/2018 5:25:24 PM By: Curtis Sites Entered By: Lenda Kelp on 01/09/2018 11:08:35 [name] ([id number]) -------------------------------------------------------------------------------- SuperBill Details Patient Name: TREYLON, HENARD. Date of Service: 01/07/2018 Medical Record Number: [id number] Patient Account Number: [id number] Date of Birth/Sex: 09/15/1950 (66 y.o. M) Treating RN: Curtis Sites Primary Care Provider: Leotis Shames Other Clinician: Referring Provider: Leotis Shames Treating  Provider/Extender: STONE III, HOYT Weeks in Treatment: 17 Diagnosis Coding ICD-10 Codes Code Description [phone] Non-pressure chronic ulcer of other part of left lower leg  with fat layer exposed E11.622 Type 2 diabetes mellitus with other skin ulcer I89.0 Lymphedema, not elsewhere classified G70.00 Myasthenia gravis without (acute) exacerbation I87.2 Venous insufficiency (chronic) (peripheral) I73.9 Peripheral vascular disease, unspecified Facility Procedures CPT4 Code: 16109604 Description: 99213 - WOUND CARE VISIT-LEV 3 EST PT Modifier: Quantity: 1 Physician Procedures CPT4 Code Description: 5409811 91478 - WC PHYS LEVEL 2 - EST PT ICD-10 Diagnosis Description 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 of other part of left lower leg wit E11.622 Type 2 diabetes mellitus with other skin ulcer I89.0 Lymphedema, not elsewhere  classified G70.00 Myasthenia gravis without (acute) exacerbation Modifier: h fat layer expos Quantity: 1 ed Electronic Signature(s) Signed: 01/08/2018 11:13:36 AM By: Lenda Kelp PA-C Entered By: Lenda Kelp on 01/08/2018 10:43:31

## 2018-01-10 NOTE — Progress Notes (Signed)
NORFLEET, CAPERS ([id number]) Visit Report for 01/07/2018 Arrival Information Details Patient Name: BETHANY, CUMMING. Date of Service: 01/07/2018 9:30 AM Medical Record Number: [id number] Patient Account Number: [id number] Date of Birth/Sex: 1950/09/29 (67 y.o. M) Treating RN: Curtis Sites Primary Care Bonetta Mostek: Leotis Shames Other Clinician: Referring Khyler Urda: Leotis Shames Treating Lucile Didonato/Extender: Linwood Dibbles, HOYT Weeks in Treatment: 17 Visit Information History Since Last Visit Added or deleted any medications: No Patient Arrived: Ambulatory Any new allergies or adverse reactions: No Arrival Time: 09:32 Had a fall or experienced change in No Accompanied By: self activities of daily living that may affect Transfer Assistance: None risk of falls: Patient Identification Verified: Yes Signs or symptoms of abuse/neglect since last visito No Secondary Verification Process Completed: Yes Hospitalized since last visit: No Patient Requires Transmission-Based No Implantable device outside of the clinic excluding No Precautions: cellular tissue based products placed in the center Patient Has Alerts: Yes since last visit: Has Dressing in Place as Prescribed: Yes Pain Present Now: No Electronic Signature(s) Signed: 01/07/2018 4:58:41 PM By: Dayton Martes RCP, RRT, CHT Entered By: Dayton Martes on 01/07/2018 09:32:39 [name] ([id number]) -------------------------------------------------------------------------------- Clinic Level of Care Assessment Details Patient Name: JAQUA, CHING L. Date of Service: 01/07/2018 9:30 AM Medical Record Number: [id number] Patient Account Number: [id number] Date of Birth/Sex: 16-Mar-1951 (67 y.o. M) Treating RN: Curtis Sites Primary Care Roby Spalla: Leotis Shames Other Clinician: Referring Mona Ayars: Leotis Shames Treating Magdalena Skilton/Extender: STONE III, HOYT Weeks in Treatment: 17 Clinic Level of  Care Assessment Items TOOL 4 Quantity Score []  - Use when only an EandM is performed on FOLLOW-UP visit 0 ASSESSMENTS - Nursing Assessment / Reassessment X - Reassessment of Co-morbidities (includes updates in patient status) 1 10 X- 1 5 Reassessment of Adherence to Treatment Plan ASSESSMENTS - Wound and Skin Assessment / Reassessment []  - Simple Wound Assessment / Reassessment - one wound 0 X- 2 5 Complex Wound Assessment / Reassessment - multiple wounds []  - 0 Dermatologic / Skin Assessment (not related to wound area) ASSESSMENTS - Focused Assessment X - Circumferential Edema Measurements - multi extremities 1 5 []  - 0 Nutritional Assessment / Counseling / Intervention X- 1 5 Lower Extremity Assessment (monofilament, tuning fork, pulses) []  - 0 Peripheral Arterial Disease Assessment (using hand held doppler) ASSESSMENTS - Ostomy and/or Continence Assessment and Care []  - Incontinence Assessment and Management 0 []  - 0 Ostomy Care Assessment and Management (repouching, etc.) PROCESS - Coordination of Care X - Simple Patient / Family Education for ongoing care 1 15 []  - 0 Complex (extensive) Patient / Family Education for ongoing care []  - 0 Staff obtains Chiropractor, Records, Test Results / Process Orders []  - 0 Staff telephones HHA, Nursing Homes / Clarify orders / etc []  - 0 Routine Transfer to another Facility (non-emergent condition) []  - 0 Routine Hospital Admission (non-emergent condition) []  - 0 New Admissions / Manufacturing engineer / Ordering NPWT, Apligraf, etc. []  - 0 Emergency Hospital Admission (emergent condition) X- 1 10 Simple Discharge Coordination ELLIE, SPICKLER. ([id number]) []  - 0 Complex (extensive) Discharge Coordination PROCESS - Special Needs []  - Pediatric / Minor Patient Management 0 []  - 0 Isolation Patient Management []  - 0 Hearing / Language / Visual special needs []  - 0 Assessment of Community assistance (transportation, D/C  planning, etc.) []  - 0 Additional assistance / Altered mentation []  - 0 Support Surface(s) Assessment (bed, cushion, seat, etc.) INTERVENTIONS - Wound Cleansing / Measurement []  - Simple Wound Cleansing - one wound 0 X-  2 5 Complex Wound Cleansing - multiple wounds X- 1 5 Wound Imaging (photographs - any number of wounds) []  - 0 Wound Tracing (instead of photographs) []  - 0 Simple Wound Measurement - one wound X- 2 5 Complex Wound Measurement - multiple wounds INTERVENTIONS - Wound Dressings []  - Small Wound Dressing one or multiple wounds 0 []  - 0 Medium Wound Dressing one or multiple wounds []  - 0 Large Wound Dressing one or multiple wounds []  - 0 Application of Medications - topical []  - 0 Application of Medications - injection INTERVENTIONS - Miscellaneous []  - External ear exam 0 []  - 0 Specimen Collection (cultures, biopsies, blood, body fluids, etc.) []  - 0 Specimen(s) / Culture(s) sent or taken to Lab for analysis []  - 0 Patient Transfer (multiple staff / Nurse, adult / Similar devices) []  - 0 Simple Staple / Suture removal (25 or less) []  - 0 Complex Staple / Suture removal (26 or more) []  - 0 Hypo / Hyperglycemic Management (close monitor of Blood Glucose) []  - 0 Ankle / Brachial Index (ABI) - do not check if billed separately X- 1 5 Vital Signs Vankuren, Markcus L. ([id number]) Has the patient been seen at the hospital within the last three years: Yes Total Score: 90 Level Of Care: New/Established - Level 3 Electronic Signature(s) Signed: 01/07/2018 5:26:03 PM By: Curtis Sites Entered By: Curtis Sites on 01/07/2018 10:16:10 [name] ([id number]) -------------------------------------------------------------------------------- Encounter Discharge Information Details Patient Name: CULLEY, HEDEEN L. Date of Service: 01/07/2018 9:30 AM Medical Record Number: [id number] Patient Account Number: [id number] Date of Birth/Sex: 30-Oct-1950 (67 y.o.  M) Treating RN: Curtis Sites Primary Care Melora Menon: Leotis Shames Other Clinician: Referring Lovella Hardie: Leotis Shames Treating Saide Lanuza/Extender: Linwood Dibbles, HOYT Weeks in Treatment: 17 Encounter Discharge Information Items Discharge Condition: Stable Ambulatory Status: Ambulatory Discharge Destination: Home Transportation: Private Auto Accompanied By: self Schedule Follow-up Appointment: No Clinical Summary of Care: Electronic Signature(s) Signed: 01/07/2018 10:16:35 AM By: Curtis Sites Entered By: Curtis Sites on 01/07/2018 10:16:35 [name] ([id number]) -------------------------------------------------------------------------------- Lower Extremity Assessment Details Patient Name: MILT, COYE L. Date of Service: 01/07/2018 9:30 AM Medical Record Number: [id number] Patient Account Number: [id number] Date of Birth/Sex: 10-12-1950 (67 y.o. M) Treating RN: Rema Jasmine Primary Care Zonnie Landen: Leotis Shames Other Clinician: Referring Joangel Vanosdol: Leotis Shames Treating Dewaine Morocho/Extender: Linwood Dibbles, HOYT Weeks in Treatment: 17 Electronic Signature(s) Signed: 01/07/2018 3:28:14 PM By: Rema Jasmine Entered By: Rema Jasmine on 01/07/2018 09:44:47 Jasmer, [name] ([id number]) -------------------------------------------------------------------------------- Multi-Disciplinary Care Plan Details Patient Name: JAQWON, MANFRED. Date of Service: 01/07/2018 9:30 AM Medical Record Number: [id number] Patient Account Number: [id number] Date of Birth/Sex: [birth year] (67 y.o. M) Treating RN: Curtis Sites Primary Care Ilyssa Grennan: Leotis Shames Other Clinician: Referring Rembert Browe: Leotis Shames Treating Kaliah Haddaway/Extender: Linwood Dibbles, HOYT Weeks in Treatment: 17 Active Inactive Electronic Signature(s) Signed: 01/07/2018 10:15:34 AM By: Curtis Sites Entered By: Curtis Sites on 01/07/2018 10:15:33 [name]  ([id number]) -------------------------------------------------------------------------------- Pain Assessment Details Patient Name: ADOLPHUS, HANF L. Date of Service: 01/07/2018 9:30 AM Medical Record Number: [id number] Patient Account Number: [id number] Date of Birth/Sex: 10-09-1950 (67 y.o. M) Treating RN: Curtis Sites Primary Care Emerald Gehres: Leotis Shames Other Clinician: Referring Lila Lufkin: Leotis Shames Treating Takiyah Bohnsack/Extender: STONE III, HOYT Weeks in Treatment: 17 Active Problems Location of Pain Severity and Description of Pain Patient Has Paino No Site Locations Pain Management and Medication Current Pain Management: Electronic Signature(s) Signed: 01/07/2018 4:58:41 PM By: Sallee Provencal, RRT, CHT Signed: 01/07/2018 5:26:03 PM By: Curtis Sites Entered By: Weyman Rodney,  Sallie on 01/07/2018 09:32:45 VISHAL, SANDLIN ([id number]) -------------------------------------------------------------------------------- Patient/Caregiver Education Details Patient Name: MATTHEWS, FRANKS. Date of Service: 01/07/2018 9:30 AM Medical Record Number: [id number] Patient Account Number: [id number] Date of Birth/Gender: [birth year] (67 y.o. M) Treating RN: Curtis Sites Primary Care Physician: Leotis Shames Other Clinician: Referring Physician: Leotis Shames Treating Physician/Extender: Skeet Simmer in Treatment: 17 Education Assessment Education Provided To: Patient Education Topics Provided Basic Hygiene: Handouts: Other: care of newly healed ulcer sites Methods: Explain/Verbal Responses: State content correctly Electronic Signature(s) Signed: 01/07/2018 5:26:03 PM By: Curtis Sites Entered By: Curtis Sites on 01/07/2018 10:16:57 [name] ([id number]) -------------------------------------------------------------------------------- Wound Assessment Details Patient Name: XZAVIEN, HARADA L. Date of Service: 01/07/2018 9:30  AM Medical Record Number: [id number] Patient Account Number: [id number] Date of Birth/Sex: [birth year] (67 y.o. M) Treating RN: Curtis Sites Primary Care Mozel Burdett: Leotis Shames Other Clinician: Referring Sharlie Shreffler: Leotis Shames Treating Coury Grieger/Extender: STONE III, HOYT Weeks in Treatment: 17 Wound Status Wound Number: 1 Primary Diabetic Wound/Ulcer of the Lower Extremity Etiology: Wound Location: Left, Lateral Lower Leg Wound Healed - Epithelialized Wounding Event: Gradually Appeared Status: Date Acquired: 08/26/2017 Comorbid Cataracts, Anemia, Lymphedema, Weeks Of Treatment: 17 History: Hypertension, Type II Diabetes Clustered Wound: No Photos Photo Uploaded By: Rema Jasmine on 01/07/2018 09:47:10 Wound Measurements Length: (cm) 0 % Reduc Width: (cm) 0 % Reduc Depth: (cm) 0 Tunneli Area: (cm) 0 Underm Volume: (cm) 0 tion in Area: 100% tion in Volume: 100% ng: No ining: No Wound Description Classification: Grade 1 Exudate Amount: None Present Foul Odor After Cleansing: No Slough/Fibrino No Wound Bed Granulation Amount: None Present (0%) Necrotic Amount: None Present (0%) Periwound Skin Texture Texture Color No Abnormalities Noted: Yes No Abnormalities Noted: No Atrophie Blanche: No Moisture Cyanosis: No No Abnormalities Noted: No Ecchymosis: No Dry / Scaly: No Erythema: No Maceration: No Hemosiderin Staining: No RISHAWN, WALCK. ([id number]) Mottled: No Pallor: No Rubor: No Temperature / Pain Temperature: No Abnormality Wound Preparation Ulcer Cleansing: Rinsed/Irrigated with Saline Topical Anesthetic Applied: None Electronic Signature(s) Signed: 01/07/2018 5:26:03 PM By: Curtis Sites Entered By: Curtis Sites on 01/07/2018 09:59:05 Paradiso, [name] ([id number]) -------------------------------------------------------------------------------- Wound Assessment Details Patient Name: IVANN, TRIMARCO L. Date of Service: 01/07/2018 9:30  AM Medical Record Number: [id number] Patient Account Number: [id number] Date of Birth/Sex: [birth year] (67 y.o. M) Treating RN: Curtis Sites Primary Care Tona Qualley: Leotis Shames Other Clinician: Referring Savion Washam: Leotis Shames Treating Celes Dedic/Extender: STONE III, HOYT Weeks in Treatment: 17 Wound Status Wound Number: 2 Primary Trauma, Other Etiology: Wound Location: Left, Proximal Lower Leg Wound Healed - Epithelialized Wounding Event: Trauma Status: Date Acquired: 12/12/2017 Comorbid Cataracts, Anemia, Lymphedema, Weeks Of Treatment: 3 History: Hypertension, Type II Diabetes Clustered Wound: No Photos Photo Uploaded By: Rema Jasmine on 01/07/2018 09:47:10 Wound Measurements Length: (cm) 0 % Reduct Width: (cm) 0 % Reduct Depth: (cm) 0 Tunnelin Area: (cm) 0 Undermi Volume: (cm) 0 ion in Area: 100% ion in Volume: 100% g: No ning: No Wound Description Classification: Partial Thickness Exudate Amount: None Present Foul Odor After Cleansing: No Slough/Fibrino No Wound Bed Granulation Amount: None Present (0%) Exposed Structure Fascia Exposed: No Fat Layer (Subcutaneous Tissue) Exposed: No Tendon Exposed: No Muscle Exposed: No Joint Exposed: No Bone Exposed: No Periwound Skin Texture Texture Color No Abnormalities Noted: Yes No Abnormalities Noted: Yes Streight, Tiandre L. ([id number]) Moisture No Abnormalities Noted: No Dry / Scaly: No Maceration: No Wound Preparation Ulcer Cleansing: Rinsed/Irrigated with Saline Topical Anesthetic Applied: None Electronic Signature(s) Signed: 01/07/2018 5:26:03 PM By: Curtis Sites Entered  By: Curtis Sites on 01/07/2018 09:59:06 Theard, [name] ([id number]) -------------------------------------------------------------------------------- Vitals Details Patient Name: [name]. Date of Service: 01/07/2018 9:30 AM Medical Record Number: [id number] Patient Account Number: [id number] Date of Birth/Sex:  Jan 30, 1951 (67 y.o. M) Treating RN: Curtis Sites Primary Care Bellagrace Sylvan: Leotis Shames Other Clinician: Referring Kiren Mcisaac: Leotis Shames Treating Zehra Rucci/Extender: STONE III, HOYT Weeks in Treatment: 17 Vital Signs Time Taken: 09:33 Temperature (F): 98.0 Height (in): 72 Pulse (bpm): 62 Weight (lbs): 323.9 Respiratory Rate (breaths/min): 16 Body Mass Index (BMI): 43.9 Blood Pressure (mmHg): 144/68 Reference Range: 80 - 120 mg / dl Electronic Signature(s) Signed: 01/07/2018 4:58:41 PM By: Dayton Martes RCP, RRT, CHT Entered By: Weyman Rodney, Lucio Edward on 01/07/2018 09:34:49

## 2018-11-07 ENCOUNTER — Other Ambulatory Visit: Payer: Self-pay | Admitting: Psychiatry

## 2018-11-07 ENCOUNTER — Other Ambulatory Visit: Payer: Self-pay | Admitting: Internal Medicine

## 2018-11-07 DIAGNOSIS — R261 Paralytic gait: Secondary | ICD-10-CM

## 2018-11-16 ENCOUNTER — Other Ambulatory Visit: Payer: Self-pay | Admitting: Psychiatry

## 2018-11-16 DIAGNOSIS — R261 Paralytic gait: Secondary | ICD-10-CM

## 2018-11-17 ENCOUNTER — Ambulatory Visit (INDEPENDENT_AMBULATORY_CARE_PROVIDER_SITE_OTHER): Payer: Medicare Other

## 2018-11-17 ENCOUNTER — Other Ambulatory Visit: Payer: BLUE CROSS/BLUE SHIELD

## 2018-11-17 ENCOUNTER — Other Ambulatory Visit: Payer: Self-pay

## 2018-11-17 DIAGNOSIS — R261 Paralytic gait: Secondary | ICD-10-CM | POA: Diagnosis not present

## 2019-02-26 IMAGING — MR MR LUMBAR SPINE W/O CM
4 of 5 series · 26 of 48 positions shown · non-contrast
Comparison: 08/19/2014

CLINICAL DATA: Severe low back pain and leg pain down to the knee

EXAM:
MRI LUMBAR SPINE WITHOUT CONTRAST
TECHNIQUE: Multiplanar, multisequence MR imaging of the lumbar spine was
performed. No intravenous contrast was administered.

[Series 2: T2 · sagittal · 4.0mm · 0.81mm/px · 6 of 15 slices shown (1 of 2)]
[im 1/15]
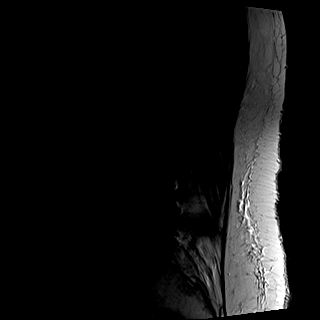
[im 3/15]
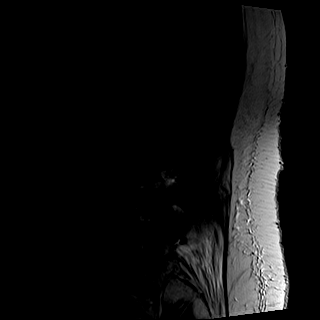
[im 6/15]
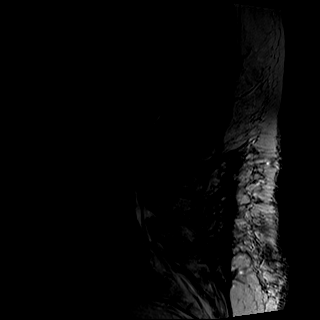
[im 9/15]
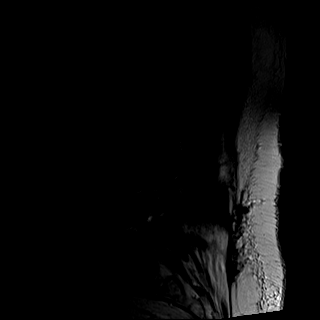
[im 12/15]
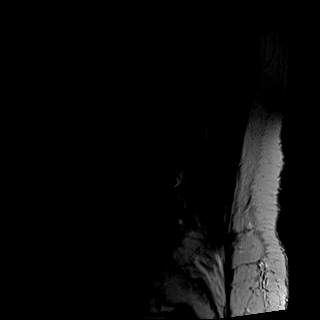
[im 15/15]
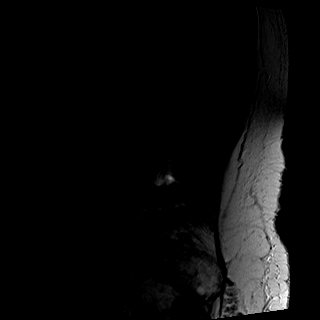

[Series 3: T1 · sagittal · 4.0mm · 0.41mm/px · 5 of 15 slices shown (1 of 2)]
[im 1/15]
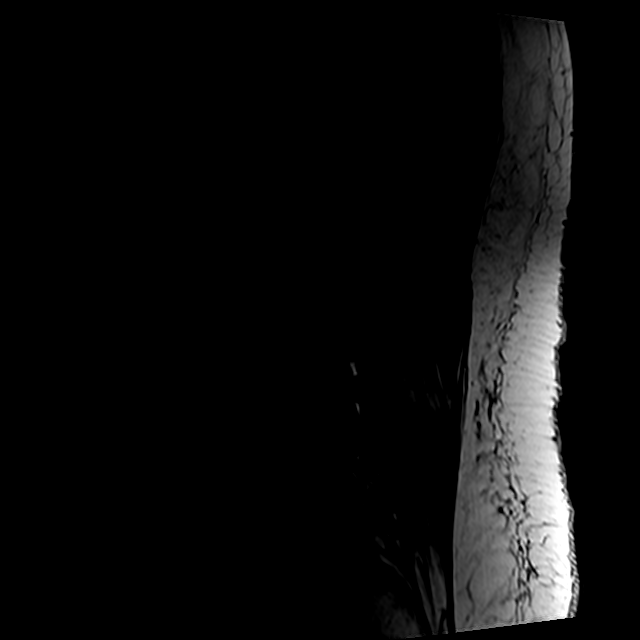
[im 4/15]
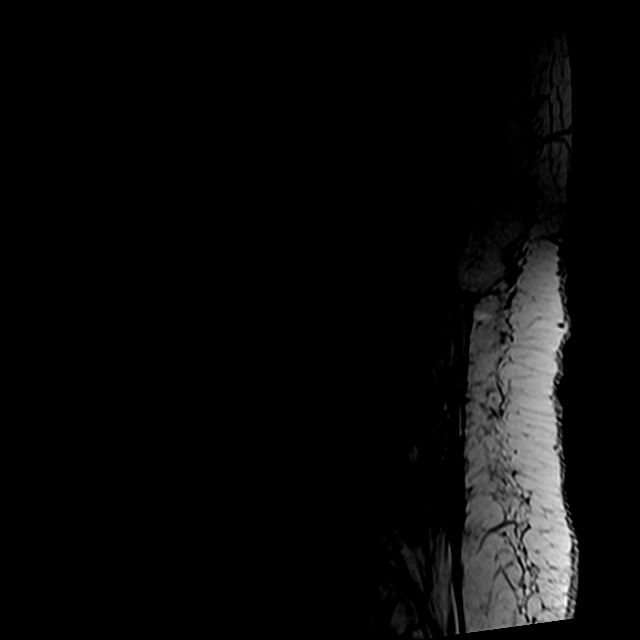
[im 8/15]
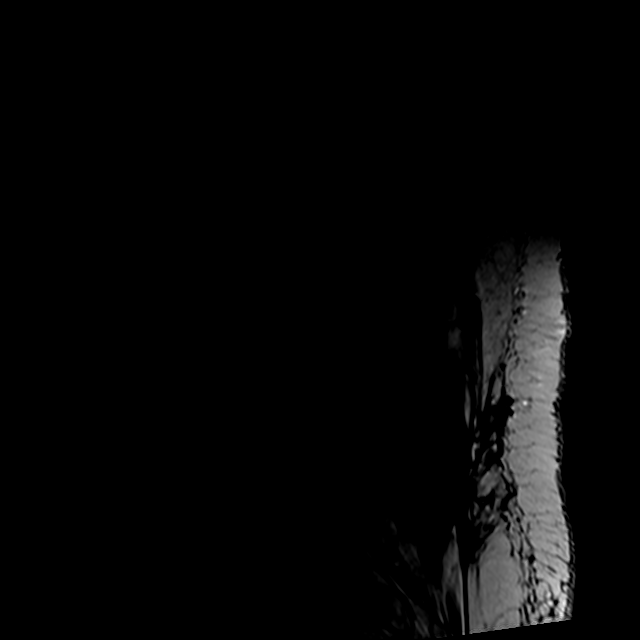
[im 11/15]
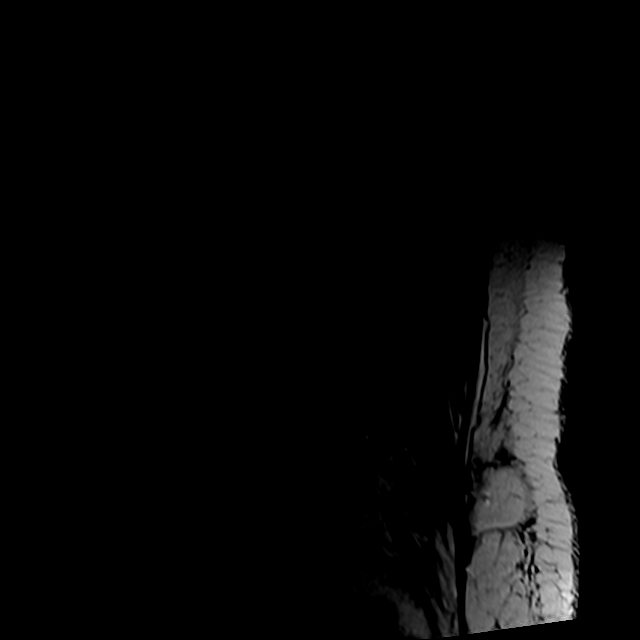
[im 15/15]
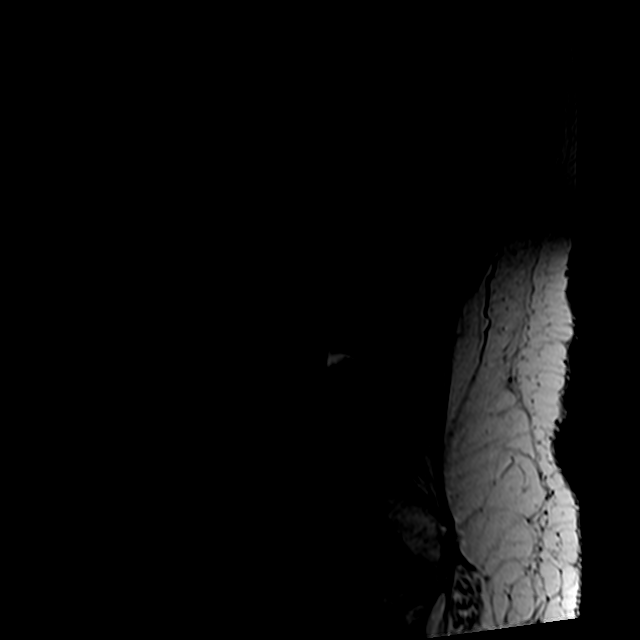

[Series 5: T2 · axial · 4.0mm · 0.82mm/px · z∈[-44,+187]mm · 10 of 43 slices shown (2 of 2)]
[im 3/43]
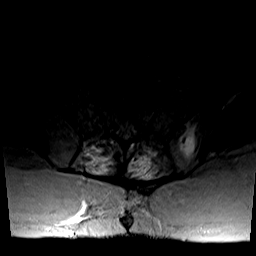
[im 6/43]
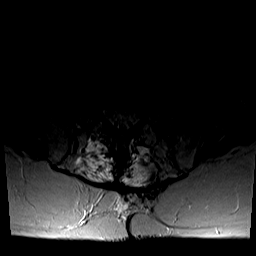
[im 9/43]
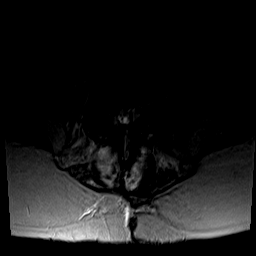
[im 15/43]
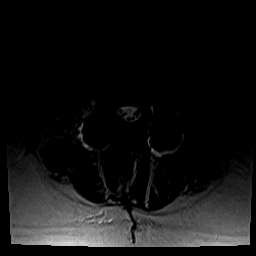
[im 20/43]
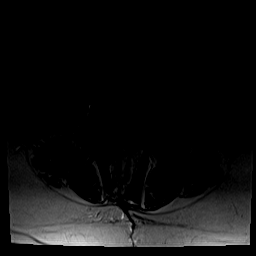
[im 23/43]
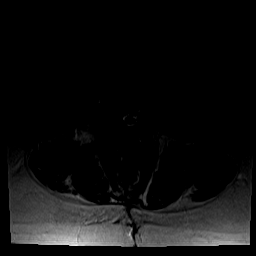
[im 26/43]
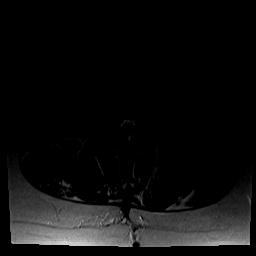
[im 31/43]
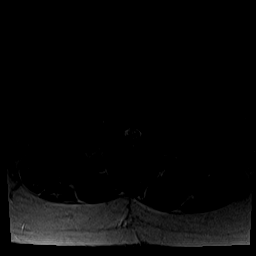
[im 37/43]
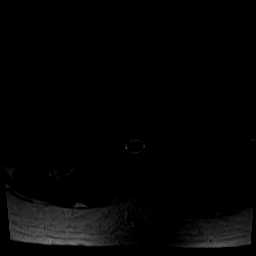
[im 43/43]
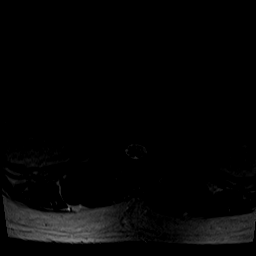

[Series 6: T1 · axial · 4.0mm · 0.41mm/px · z∈[-44,+157]mm · 5 of 43 slices shown (2 of 2)]
[im 3/43]
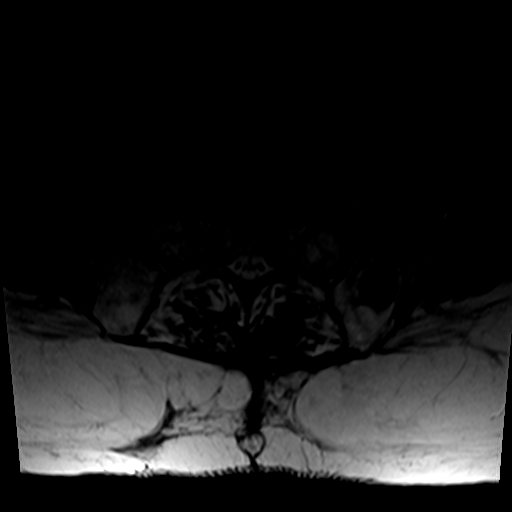
[im 6/43]
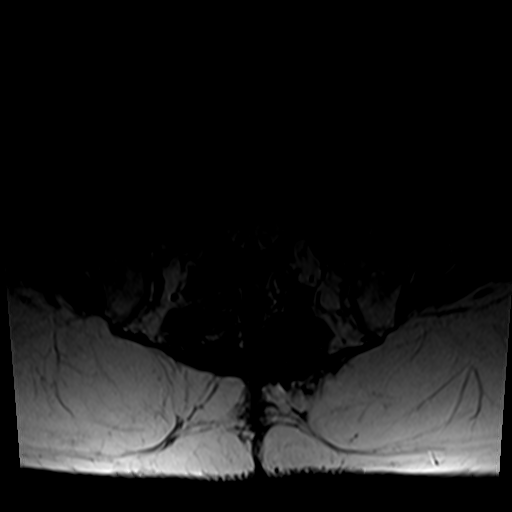
[im 9/43]
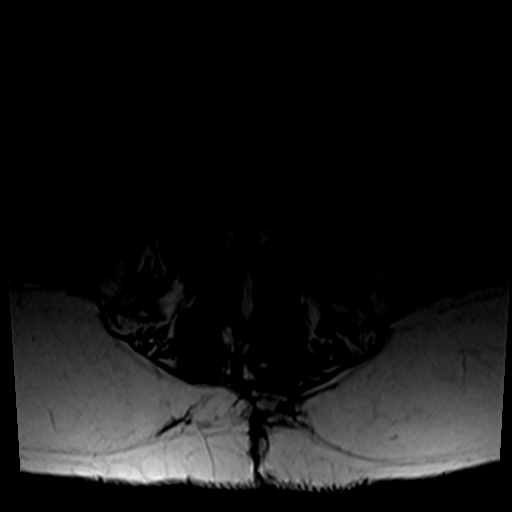
[im 23/43]
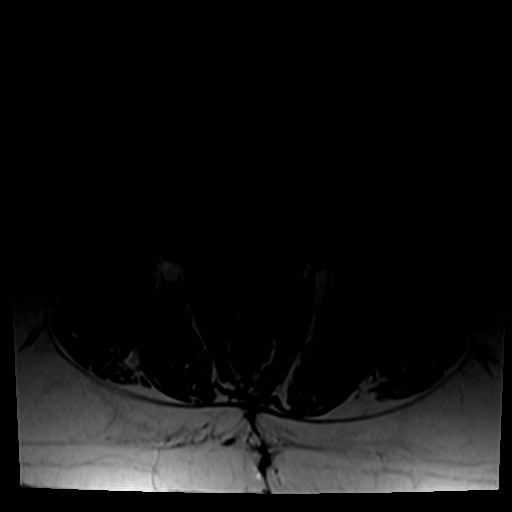
[im 37/43]
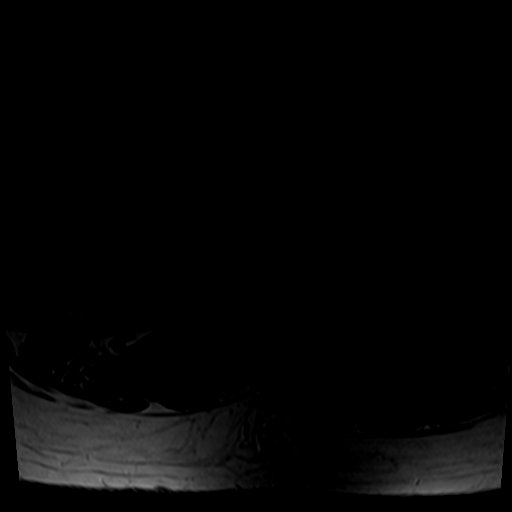

[26 of 48 positions shown; findings below may reference images not displayed]

FINDINGS: Segmentation:  Standard.

Alignment:  Physiologic.

Vertebrae: No fracture, evidence of discitis, or bone lesion.
Developmentally small spinal canal.

Conus medullaris and cauda equina: Conus extends to the T12 level.
Conus and cauda equina appear normal.

Paraspinal and other soft tissues: Postsurgical changes in the
posterior paraspinal soft tissues at L4-5.

Other: Postsurgical changes in the left posterior ilium.

Disc levels:

Disc spaces: Posterior lumbar fusion at L4-5. Degenerative disc
disease with disc height loss at T12-L1, L1-2, L3-4 and L4-5.

T12-L1: No significant disc bulge. No evidence of neural foraminal
stenosis. No central canal stenosis.

L1-L2: Mild broad-based disc bulge. Mild right foraminal stenosis.
No left foraminal stenosis. No evidence of neural foraminal
stenosis. No central canal stenosis.

L2-L3: Mild broad-based disc bulge. Mild bilateral facet
arthropathy. Mild right foraminal stenosis. No left foraminal
stenosis. No central canal stenosis.

L3-L4: Broad-based disc bulge flattening the ventral thecal sac.
Moderate bilateral facet arthropathy. Moderate spinal stenosis.
Severe bilateral foraminal stenosis.

L4-L5: Broad-based disc bulge flattening the ventral thecal sac.
Bilateral facet arthropathy. Severe left foraminal stenosis. Mild
right foraminal stenosis. No central canal stenosis.

L5-S1: Broad-based disc bulge. Mild bilateral facet arthropathy. No
evidence of neural foraminal stenosis. No central canal stenosis.
IMPRESSION: 1. At L4-5 there is posterior lumbar fusion. Broad-based disc bulge
flattening the ventral thecal sac. Bilateral facet arthropathy.
Severe left foraminal stenosis.
2. At L3-4 there is a broad-based disc bulge flattening the ventral
thecal sac. Moderate bilateral facet arthropathy. Moderate spinal
stenosis. Severe bilateral foraminal stenosis.
3. At L2-3 there is a mild broad-based disc bulge. Mild bilateral
facet arthropathy. Mild right foraminal stenosis.

## 2019-11-25 ENCOUNTER — Other Ambulatory Visit: Payer: Medicare Other

## 2022-03-30 LAB — EXTERNAL GENERIC LAB PROCEDURE

## 2022-03-30 LAB — COLOGUARD

## 2022-04-21 LAB — COLOGUARD: COLOGUARD: NEGATIVE

## 2022-04-21 LAB — EXTERNAL GENERIC LAB PROCEDURE: COLOGUARD: NEGATIVE

## 2023-07-16 ENCOUNTER — Encounter: Payer: Self-pay | Admitting: *Deleted

## 2023-07-16 NOTE — PMR Pre-admission (Signed)
 PMR Admission Coordinator Pre-Admission Assessment  Patient: [name]. is an 73 y.o., male MRN: [id number] DOB: 1950-11-26 Height: 5\' 8"  (1.727 m) Weight: 128 kg  Insurance Information HMO: yes    PPO:      PCP:      IPA:      80/20:      OTHER:  PRIMARY: [employer] Medicare      Policy#: [id number]      Subscriber: [name]      Phone#: [phone] option 3     Fax#: [phone] Pre-Cert#: [id number] approved 4/25 until 5/1      Employer:  Benefits:  Phone #: [phone]     Name: 4/22 Eff. Date: 03/27/23     Deduct: none      Out of Pocket Max: $3900      Life Max: none CIR: $295 co pay per day days 1 until 5      SNF: no co pay per day days 1 until 20; $203 co pay per day days 21 until 100 Outpatient: $20 per visit     Co-Pay:  Home Health: 100%      Co-Pay: visits per medical neccesity DME: 80%     Co-Pay: 20% Providers: in network  SECONDARY: none      Policy#:       Phone#:   Artist:       Phone#:   The Data processing manager" for patients in Inpatient Rehabilitation Facilities with attached "Privacy Act Statement-Health Care Records" was provided and verbally reviewed with: Patient and Family  Emergency Contact Information Contact Information   None on File    Other Contacts     Name Relation Home Work Mobile   [name] [phone]       Current Medical History  Patient Admitting Diagnosis: CVA  History of Present Illness: 73 year old male with history of HTN, HLD, seropositive myasthenia gravis, DM2 [name], hypothyroidism, Kidney stone,GERD, cervical stenosis s/p C3-7 fusion in 2020 who presented to [facility] on 07/13/23 with left arm weakness and dysarthria. He has two day history of sudden onset left hand clumsiness and coordination, that had persisted with just mild improvement. Also noted double vision. His regular Neurologist recommended increase in prednisone  from 2 mg to 10 mg  recently thinking this was his myasthenia.   CT brain without contrast 4/20 with new hypodensity within the right periventricular white matter extending into the inferior frontal lobe/operculum compatible with a recent infarct. No intracranial hemorrhage or mass effect. Unable to received contrast for CTA head and neck. Carotid ultrasound on the right there is mixed plaque. Evidence of hemodynamically significant stenosis in the internal carotid artery. Flow in the right vertebral artery is antegrade.No evidence of hemodynamically significant stenosis in the bilateral internal carotid arteries. MRI brain 4/21 acute right MCA distribution infarct involving the right periventricular white matter extending into the right frontal and parietal lobes and insula . TTE normal with no PFO. Neurology recommended atorvastatin  with LDL 95 and ASA. Continue home Cellcept  and home prednisone  for MG. Holding home losartan  to allow for permissive HTN. 30 day cardiac monitor recommended.   Resume home sodium bicarbonate  for history of kidney stones. Miralax  for constipation. Hgb A1c 8.6 . Takes Tresiba  and Metformin  at home. Has continuous monitoring system in use at home. On long acting insulin  in hospital and SSI. Continue home levothyroxine . Lovenox  for DVT prophylaxis.   Sustained a fall 4/24 when bending  over in his chair to scratch his foot with + head strike, no LOC. CTH showed expansion of the R MCA infarct with local mass effect and new leftward midline shift of 3 mm. No hemorrhage. Repeat CTH scan 6 hrs later was stable.   Patient's medical record from [facility] has been reviewed by the rehabilitation admission coordinator and physician.  Past Medical History  Past Medical History:  Diagnosis Date   Anemia    Diabetes mellitus without complication (HCC)    Hypercholesteremia    Hypothyroidism    Kidney stones    Leg weakness, bilateral    due to Myasthenia Gravis   Myasthenia gravis  (HCC)    Wears hearing aid    bilateral   Has the patient had major surgery during 100 days prior to admission? No  Family History   family history includes Clotting disorder in his father; Heart attack in his father.  Current Medications  Current Outpatient Medications:    aspirin  81 MG tablet, Take 81 mg by mouth daily., Disp: , Rfl:    benzonatate (TESSALON) 200 MG capsule, Take 200 mg by mouth 3 (three) times daily as needed for cough., Disp: , Rfl:    Cyanocobalamin 1000 MCG/ML KIT, Inject as directed., Disp: , Rfl:    etodolac (LODINE) 500 MG tablet, Take 500 mg by mouth daily as needed., Disp: , Rfl:    insulin  glargine (LANTUS ) 100 UNIT/ML injection, Inject 54 Units into the skin daily., Disp: , Rfl:    insulin  lispro (HUMALOG) 100 UNIT/ML injection, Inject into the skin as needed for high blood sugar (sliding scale)., Disp: , Rfl:    levalbuterol (XOPENEX) 1.25 MG/3ML nebulizer solution, Take 1.25 mg by nebulization every 4 (four) hours as needed for wheezing., Disp: , Rfl:    levothyroxine  (SYNTHROID , LEVOTHROID) 150 MCG tablet, Take 150 mcg by mouth daily before breakfast., Disp: , Rfl:    metFORMIN  (GLUCOPHAGE ) 1000 MG tablet, Take 1,000 mg by mouth 2 (two) times daily with a meal., Disp: , Rfl:    minocycline (DYNACIN) 100 MG tablet, Take 100 mg by mouth 2 (two) times daily., Disp: , Rfl:    mycophenolate  (CELLCEPT ) 250 MG capsule, Take by mouth 2 (two) times daily. 1500 mg AM, 1250 mg PM, Disp: , Rfl:    pantoprazole (PROTONIX) 40 MG tablet, Take 40 mg by mouth 2 (two) times daily., Disp: , Rfl:    pioglitazone (ACTOS) 45 MG tablet, Take 45 mg by mouth daily., Disp: , Rfl:    pravastatin (PRAVACHOL) 80 MG tablet, Take 80 mg by mouth daily., Disp: , Rfl:    predniSONE  (DELTASONE ) 10 MG tablet, Take 30 mg by mouth daily with breakfast., Disp: , Rfl:    pyridostigmine (MESTINON) 60 MG tablet, Take 60 mg by mouth 3 (three) times daily., Disp: , Rfl:   Patients Current Diet:  Diet Regular with thin liquids  Precautions / Restrictions Precautions: Fall Weight Bearing Restrictions Per Provider Order: No   Has the patient had 2 or more falls or a fall with injury in the past year? No  Prior Activity Level Community (5-7x/wk): Mod I with cane, drove, [facility] does taxes  Prior Functional Level Self Care: Did the patient need help bathing, dressing, using the toilet or eating? Independent  Indoor Mobility: Did the patient need assistance with walking from room to room (with or without device)? Independent  Stairs: Did the patient need assistance with internal or external stairs (with or without device)?  Independent  Functional Cognition: Did the patient need help planning regular tasks such as shopping or remembering to take medications? Independent  Patient Information Are you of Hispanic, Latino/a,or Spanish origin?: A. No, not of Hispanic, Latino/a, or Spanish origin What is your race?: A. White Do you need or want an interpreter to communicate with a doctor or health care staff?: 0. No  Patient's Response To:  Health Literacy and Transportation Is the patient able to respond to health literacy and transportation needs?: Yes Health Literacy - How often do you need to have someone help you when you read instructions, pamphlets, or other written material from your doctor or pharmacy?: Never In the past 12 months, has lack of transportation kept you from medical appointments or from getting medications?: No In the past 12 months, has lack of transportation kept you from meetings, work, or from getting things needed for daily living?: No  Home Assistive Devices / Web designer (specify quad or straight)  Prior Device Use: Indicate devices/aids used by the patient prior to current illness, exacerbation or injury?  Cane/ walking stick   Prior Functional Level Current Functional Level  Bed Mobility  Independent Min assist   Transfers  Independent  Mod  assist   Mobility - Walk/Wheelchair  Mod Independent  Mod assist (30', 74' and 73' with hemiwalker on 4/23)   Upper Body Dressing  Independent  Mod assist   Lower Body Dressing  Independent  Mod assist   Grooming  Independent  Min assist   Eating/Drinking  Independent  Min assist   Toilet Transfer  Independent  Mod assist   Bladder Continence   continent  continent   Bowel Management  continent  continent   Stair Climbing  Mod Independent  -- (not attempted)   Communication  independent  dysarthria   Memory  intact intact     Special Needs/ Care Considerations   Hgb A1c 8.6 on 4/17; wears continuous CBGS monitor at home Decreased awareness of deficits Fall on acute 4/24 30 day cardiac monitor at discharge  Previous Home Environment  Living Arrangements: Spouse/significant other  Lives With: Spouse Available Help at Discharge: Family; Available 24 hours/day (wife retired) Type of Home: [employer] Layout: Two level; Able to live on main level with bedroom/bathroom Alternate Level Stairs-Number of Steps: flight Home Access: Stairs to enter Entrance Stairs-Rails: Right Entrance Stairs-Number of Steps: 3 Bathroom Shower/Tub: Health visitor: Handicapped height Bathroom Accessibility: Yes How Accessible: Accessible via walker Home Care Services: No  Discharge Living Setting Plans for Discharge Living Setting: Patient's home; Lives with (comment); House (wife) Type of Home at Discharge: House Discharge Home Layout: Two level; Able to live on main level with bedroom/bathroom Alternate Level Stairs-Number of Steps: flight Discharge Home Access: Stairs to enter Entrance Stairs-Rails: Right Entrance Stairs-Number of Steps: 3 Discharge Bathroom Shower/Tub: Walk-in shower Discharge Bathroom Toilet: Handicapped height Discharge Bathroom Accessibility: Yes How Accessible: Accessible via walker Does the patient have any problems  obtaining your medications?: No  Social/Family/Support Systems Patient Roles: Spouse; Parent (tax accountant; 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) Solicitor Information: wife, Oluwatimilehin Balfour Anticipated Caregiver: wife Anticipated Caregiver's Contact Information: [phone] Ability/Limitations of Caregiver: retired, no limtations Caregiver Availability: 24/7 Discharge Plan Discussed with Primary Caregiver: Yes Is Caregiver In Agreement with Plan?: Yes Does Caregiver/Family have Issues with Lodging/Transportation while Douglas is in Rehab?: No  Goals Patient/Family Goal for Rehab: supervision with Douglas, supervision to min OT, supervision with SLP Expected length of stay: ELOS 10 to  12 days Douglas/Family Agrees to Admission and willing to participate: Yes Program Orientation Provided & Reviewed with Douglas/Caregiver Including Roles  & Responsibilities: Yes  Decrease burden of Care through IP rehab admission: n/a  Possible need for SNF placement upon discharge: not anticipated  Patient Condition: I have reviewed medical records from [facility], spoken with CM, and patient and spouse. I discussed via phone for inpatient rehabilitation assessment.  Patient will benefit from ongoing Douglas, OT, and SLP, can actively participate in 3 hours of therapy a day 5 days of the week, and can make measurable gains during the admission.  Patient will also benefit from the coordinated team approach during an Inpatient Acute Rehabilitation admission.  The patient will receive intensive therapy as well as Rehabilitation physician, nursing, social worker, and care management interventions.  Due to bladder management, bowel management, safety, skin/wound care, disease management, medication administration, pain management, and patient education the patient requires 24 hour a day rehabilitation nursing.  The patient is currently mod assist overall with mobility and basic ADLs.  Discharge setting and therapy post  discharge at home with outpatient is anticipated.  Patient has agreed to participate in the Acute Inpatient Rehabilitation Program and will admit today.  Preadmission Screen Completed By:  Tanya Fantasia RN MSN, 07/19/2023 8:29 AM ______________________________________________________________________   Discussed status with Dr. Rachel Budds on 07/19/23 at [phone] and received approval for admission today.  Admission Coordinator:  Tanya Fantasia, RN MSN time [id number] Date 07/19/23   Assessment/Plan: Diagnosis: right MCA infarct Does the need for close, 24 hr/day Medical supervision in concert with the patient's rehab needs make it unreasonable for this patient to be served in a less intensive setting? Yes Co-Morbidities requiring supervision/potential complications: DM, MG, [name] Due to bladder management, bowel management, safety, skin/wound care, disease management, medication administration, pain management, and patient education, does the patient require 24 hr/day rehab nursing? Yes Does the patient require coordinated care of a physician, rehab nurse, Douglas, OT, and SLP to address physical and functional deficits in the context of the above medical diagnosis(es)? Yes Addressing deficits in the following areas: balance, endurance, locomotion, strength, transferring, bowel/bladder control, bathing, dressing, feeding, grooming, toileting, cognition, speech, and psychosocial support Can the patient actively participate in an intensive therapy program of at least 3 hrs of therapy 5 days a week? Yes The potential for patient to make measurable gains while on inpatient rehab is excellent Anticipated functional outcomes upon discharge from inpatient rehab: supervision Douglas, supervision and min assist OT, supervision SLP Estimated rehab length of stay to reach the above functional goals is: 10-12 days Anticipated discharge destination: Home 10. Overall Rehab/Functional Prognosis: excellent  MD  Signature Rawland Caddy, MD, [facility] Physical Medicine & Rehabilitation Medical Director Rehabilitation Services 07/19/2023

## 2023-07-19 ENCOUNTER — Encounter (HOSPITAL_COMMUNITY): Payer: Self-pay | Admitting: Physical Medicine & Rehabilitation

## 2023-07-19 ENCOUNTER — Inpatient Hospital Stay (HOSPITAL_COMMUNITY)
Admission: AD | Admit: 2023-07-19 | Discharge: 2023-08-13 | DRG: 057 | Disposition: A | Discharge status: Home / Self Care | Source: Intra-hospital | Attending: Physical Medicine & Rehabilitation | Admitting: Physical Medicine & Rehabilitation

## 2023-07-19 ENCOUNTER — Other Ambulatory Visit: Payer: Self-pay

## 2023-07-19 DIAGNOSIS — N183 Chronic kidney disease, stage 3 unspecified: Secondary | ICD-10-CM | POA: Diagnosis present

## 2023-07-19 DIAGNOSIS — Z9181 History of falling: Secondary | ICD-10-CM | POA: Diagnosis not present

## 2023-07-19 DIAGNOSIS — E119 Type 2 diabetes mellitus without complications: Secondary | ICD-10-CM | POA: Diagnosis not present

## 2023-07-19 DIAGNOSIS — Z881 Allergy status to other antibiotic agents status: Secondary | ICD-10-CM | POA: Diagnosis not present

## 2023-07-19 DIAGNOSIS — D62 Acute posthemorrhagic anemia: Secondary | ICD-10-CM | POA: Diagnosis not present

## 2023-07-19 DIAGNOSIS — Z880 Allergy status to penicillin: Secondary | ICD-10-CM | POA: Diagnosis not present

## 2023-07-19 DIAGNOSIS — Z7984 Long term (current) use of oral hypoglycemic drugs: Secondary | ICD-10-CM

## 2023-07-19 DIAGNOSIS — G7000 Myasthenia gravis without (acute) exacerbation: Secondary | ICD-10-CM | POA: Diagnosis present

## 2023-07-19 DIAGNOSIS — E1122 Type 2 diabetes mellitus with diabetic chronic kidney disease: Secondary | ICD-10-CM | POA: Diagnosis present

## 2023-07-19 DIAGNOSIS — Z7952 Long term (current) use of systemic steroids: Secondary | ICD-10-CM

## 2023-07-19 DIAGNOSIS — I69322 Dysarthria following cerebral infarction: Secondary | ICD-10-CM | POA: Diagnosis not present

## 2023-07-19 DIAGNOSIS — Z961 Presence of intraocular lens: Secondary | ICD-10-CM | POA: Diagnosis present

## 2023-07-19 DIAGNOSIS — Z79899 Other long term (current) drug therapy: Secondary | ICD-10-CM | POA: Diagnosis not present

## 2023-07-19 DIAGNOSIS — K5901 Slow transit constipation: Secondary | ICD-10-CM | POA: Diagnosis not present

## 2023-07-19 DIAGNOSIS — Z9841 Cataract extraction status, right eye: Secondary | ICD-10-CM

## 2023-07-19 DIAGNOSIS — K219 Gastro-esophageal reflux disease without esophagitis: Secondary | ICD-10-CM | POA: Diagnosis present

## 2023-07-19 DIAGNOSIS — I69392 Facial weakness following cerebral infarction: Secondary | ICD-10-CM | POA: Diagnosis not present

## 2023-07-19 DIAGNOSIS — R600 Localized edema: Secondary | ICD-10-CM | POA: Diagnosis not present

## 2023-07-19 DIAGNOSIS — I69334 Monoplegia of upper limb following cerebral infarction affecting left non-dominant side: Principal | ICD-10-CM

## 2023-07-19 DIAGNOSIS — K5900 Constipation, unspecified: Secondary | ICD-10-CM | POA: Insufficient documentation

## 2023-07-19 DIAGNOSIS — Z79890 Hormone replacement therapy (postmenopausal): Secondary | ICD-10-CM | POA: Diagnosis not present

## 2023-07-19 DIAGNOSIS — G8114 Spastic hemiplegia affecting left nondominant side: Secondary | ICD-10-CM | POA: Diagnosis not present

## 2023-07-19 DIAGNOSIS — E538 Deficiency of other specified B group vitamins: Secondary | ICD-10-CM | POA: Diagnosis present

## 2023-07-19 DIAGNOSIS — I69323 Fluency disorder following cerebral infarction: Secondary | ICD-10-CM

## 2023-07-19 DIAGNOSIS — I872 Venous insufficiency (chronic) (peripheral): Secondary | ICD-10-CM | POA: Diagnosis present

## 2023-07-19 DIAGNOSIS — E7800 Pure hypercholesterolemia, unspecified: Secondary | ICD-10-CM | POA: Diagnosis present

## 2023-07-19 DIAGNOSIS — Z8249 Family history of ischemic heart disease and other diseases of the circulatory system: Secondary | ICD-10-CM | POA: Diagnosis not present

## 2023-07-19 DIAGNOSIS — Z87442 Personal history of urinary calculi: Secondary | ICD-10-CM

## 2023-07-19 DIAGNOSIS — M25562 Pain in left knee: Secondary | ICD-10-CM | POA: Diagnosis present

## 2023-07-19 DIAGNOSIS — R4189 Other symptoms and signs involving cognitive functions and awareness: Secondary | ICD-10-CM | POA: Diagnosis present

## 2023-07-19 DIAGNOSIS — E039 Hypothyroidism, unspecified: Secondary | ICD-10-CM | POA: Diagnosis present

## 2023-07-19 DIAGNOSIS — Z79624 Long term (current) use of inhibitors of nucleotide synthesis: Secondary | ICD-10-CM

## 2023-07-19 DIAGNOSIS — D649 Anemia, unspecified: Secondary | ICD-10-CM | POA: Insufficient documentation

## 2023-07-19 DIAGNOSIS — E1169 Type 2 diabetes mellitus with other specified complication: Secondary | ICD-10-CM | POA: Diagnosis not present

## 2023-07-19 DIAGNOSIS — I63511 Cerebral infarction due to unspecified occlusion or stenosis of right middle cerebral artery: Secondary | ICD-10-CM | POA: Diagnosis not present

## 2023-07-19 DIAGNOSIS — I129 Hypertensive chronic kidney disease with stage 1 through stage 4 chronic kidney disease, or unspecified chronic kidney disease: Secondary | ICD-10-CM | POA: Diagnosis present

## 2023-07-19 DIAGNOSIS — I10 Essential (primary) hypertension: Secondary | ICD-10-CM | POA: Diagnosis not present

## 2023-07-19 DIAGNOSIS — I639 Cerebral infarction, unspecified: Secondary | ICD-10-CM | POA: Diagnosis not present

## 2023-07-19 DIAGNOSIS — Z794 Long term (current) use of insulin: Secondary | ICD-10-CM

## 2023-07-19 DIAGNOSIS — Z974 Presence of external hearing-aid: Secondary | ICD-10-CM

## 2023-07-19 DIAGNOSIS — Z7982 Long term (current) use of aspirin: Secondary | ICD-10-CM

## 2023-07-19 DIAGNOSIS — M549 Dorsalgia, unspecified: Secondary | ICD-10-CM | POA: Diagnosis present

## 2023-07-19 HISTORY — DX: Venous insufficiency (chronic) (peripheral): I87.2

## 2023-07-19 HISTORY — DX: Deficiency of other specified B group vitamins: E53.8

## 2023-07-19 HISTORY — DX: Spinal stenosis, site unspecified: M48.00

## 2023-07-19 HISTORY — DX: Chronic kidney disease, stage 3 unspecified: N18.30

## 2023-07-19 HISTORY — DX: Gastro-esophageal reflux disease without esophagitis: K21.9

## 2023-07-19 HISTORY — DX: Disease of spinal cord, unspecified: G95.9

## 2023-07-19 LAB — GLUCOSE, CAPILLARY
Glucose-Capillary: 202 mg/dL — ABNORMAL HIGH (ref 70–99)
Glucose-Capillary: 215 mg/dL — ABNORMAL HIGH (ref 70–99)

## 2023-07-19 LAB — HEMOGLOBIN A1C
Hgb A1c MFr Bld: 8.1 % — ABNORMAL HIGH (ref 4.8–5.6)
Mean Plasma Glucose: 185.77 mg/dL

## 2023-07-19 MED ORDER — SODIUM BICARBONATE 650 MG PO TABS
1950.0000 mg | ORAL_TABLET | Freq: Two times a day (BID) | ORAL | Status: DC
  Administered 2023-07-19 – 2023-08-13 (×51): 1950 mg via ORAL
  Filled 2023-07-19 (×52): qty 3

## 2023-07-19 MED ORDER — INSULIN GLARGINE-YFGN 100 UNIT/ML ~~LOC~~ SOLN
22.0000 [IU] | Freq: Every day | SUBCUTANEOUS | Status: DC
  Administered 2023-07-20: 22 [IU] via SUBCUTANEOUS
  Filled 2023-07-19: qty 0.22

## 2023-07-19 MED ORDER — ATORVASTATIN CALCIUM 80 MG PO TABS
80.0000 mg | ORAL_TABLET | Freq: Every day | ORAL | Status: DC
  Administered 2023-07-19 – 2023-08-12 (×25): 80 mg via ORAL
  Filled 2023-07-19 (×25): qty 1

## 2023-07-19 MED ORDER — INSULIN GLARGINE-YFGN 100 UNIT/ML ~~LOC~~ SOPN
22.0000 [IU] | PEN_INJECTOR | SUBCUTANEOUS | Status: DC
  Filled 2023-07-19: qty 3

## 2023-07-19 MED ORDER — PREDNISONE 1 MG PO TABS
2.0000 mg | ORAL_TABLET | Freq: Every day | ORAL | Status: DC
  Administered 2023-07-20 – 2023-08-13 (×25): 2 mg via ORAL
  Filled 2023-07-19 (×25): qty 2

## 2023-07-19 MED ORDER — LOSARTAN POTASSIUM 50 MG PO TABS
50.0000 mg | ORAL_TABLET | Freq: Every day | ORAL | Status: DC
  Administered 2023-07-20 – 2023-08-13 (×24): 50 mg via ORAL
  Filled 2023-07-19 (×25): qty 1

## 2023-07-19 MED ORDER — INSULIN GLARGINE-YFGN 100 UNIT/ML ~~LOC~~ SOLN
22.0000 [IU] | Freq: Every day | SUBCUTANEOUS | Status: DC
  Filled 2023-07-19: qty 0.22

## 2023-07-19 MED ORDER — ALUM & MAG HYDROXIDE-SIMETH 200-200-20 MG/5ML PO SUSP
30.0000 mL | ORAL | Status: DC | PRN
  Administered 2023-08-01: 30 mL via ORAL
  Filled 2023-07-19: qty 30

## 2023-07-19 MED ORDER — PROCHLORPERAZINE 25 MG RE SUPP: 12.5000 mg | Freq: Four times a day (QID) | RECTAL | Status: DC | PRN

## 2023-07-19 MED ORDER — MYCOPHENOLATE MOFETIL 250 MG PO CAPS
1250.0000 mg | ORAL_CAPSULE | Freq: Every day | ORAL | Status: DC
  Administered 2023-07-19 – 2023-08-12 (×25): 1250 mg via ORAL
  Filled 2023-07-19 (×25): qty 5

## 2023-07-19 MED ORDER — LEVOTHYROXINE SODIUM 75 MCG PO TABS
150.0000 ug | ORAL_TABLET | Freq: Every day | ORAL | Status: DC
  Administered 2023-07-20 – 2023-08-13 (×25): 150 ug via ORAL
  Filled 2023-07-19 (×26): qty 2

## 2023-07-19 MED ORDER — INSULIN ASPART 100 UNIT/ML IJ SOLN: 0.0000 [IU] | Freq: Three times a day (TID) | INTRAMUSCULAR | Status: DC

## 2023-07-19 MED ORDER — GUAIFENESIN-DM 100-10 MG/5ML PO SYRP: 5.0000 mL | ORAL_SOLUTION | Freq: Four times a day (QID) | ORAL | Status: DC | PRN

## 2023-07-19 MED ORDER — DIPHENHYDRAMINE HCL 25 MG PO CAPS: 25.0000 mg | ORAL_CAPSULE | Freq: Four times a day (QID) | ORAL | Status: DC | PRN

## 2023-07-19 MED ORDER — BISACODYL 10 MG RE SUPP: 10.0000 mg | Freq: Every day | RECTAL | Status: DC | PRN

## 2023-07-19 MED ORDER — INSULIN ASPART 100 UNIT/ML IJ SOLN
0.0000 [IU] | Freq: Three times a day (TID) | INTRAMUSCULAR | Status: DC
  Administered 2023-07-19 (×2): 3 [IU] via SUBCUTANEOUS
  Administered 2023-07-20: 2 [IU] via SUBCUTANEOUS
  Administered 2023-07-20: 5 [IU] via SUBCUTANEOUS
  Administered 2023-07-20 – 2023-07-22 (×5): 2 [IU] via SUBCUTANEOUS
  Administered 2023-07-22: 1 [IU] via SUBCUTANEOUS
  Administered 2023-07-22: 2 [IU] via SUBCUTANEOUS
  Administered 2023-07-23: 3 [IU] via SUBCUTANEOUS
  Administered 2023-07-23: 2 [IU] via SUBCUTANEOUS
  Administered 2023-07-23: 1 [IU] via SUBCUTANEOUS
  Administered 2023-07-24 (×2): 3 [IU] via SUBCUTANEOUS
  Administered 2023-07-24: 2 [IU] via SUBCUTANEOUS
  Administered 2023-07-25 (×2): 1 [IU] via SUBCUTANEOUS
  Administered 2023-07-26 (×2): 2 [IU] via SUBCUTANEOUS
  Administered 2023-07-27: 1 [IU] via SUBCUTANEOUS
  Administered 2023-07-27: 3 [IU] via SUBCUTANEOUS
  Administered 2023-07-28: 2 [IU] via SUBCUTANEOUS
  Administered 2023-07-28 (×2): 1 [IU] via SUBCUTANEOUS
  Administered 2023-07-29 – 2023-07-31 (×7): 2 [IU] via SUBCUTANEOUS
  Administered 2023-07-31 (×2): 3 [IU] via SUBCUTANEOUS
  Administered 2023-08-01: 1 [IU] via SUBCUTANEOUS
  Administered 2023-08-01: 2 [IU] via SUBCUTANEOUS
  Administered 2023-08-01 – 2023-08-02 (×3): 1 [IU] via SUBCUTANEOUS
  Administered 2023-08-03: 2 [IU] via SUBCUTANEOUS
  Administered 2023-08-03: 1 [IU] via SUBCUTANEOUS
  Administered 2023-08-04 (×2): 2 [IU] via SUBCUTANEOUS
  Administered 2023-08-04 – 2023-08-05 (×3): 1 [IU] via SUBCUTANEOUS
  Administered 2023-08-06: 2 [IU] via SUBCUTANEOUS
  Administered 2023-08-06: 1 [IU] via SUBCUTANEOUS
  Administered 2023-08-07 – 2023-08-08 (×4): 2 [IU] via SUBCUTANEOUS
  Administered 2023-08-08 – 2023-08-09 (×3): 1 [IU] via SUBCUTANEOUS
  Administered 2023-08-10: 2 [IU] via SUBCUTANEOUS
  Administered 2023-08-11 (×3): 1 [IU] via SUBCUTANEOUS
  Administered 2023-08-12: 2 [IU] via SUBCUTANEOUS
  Administered 2023-08-12 – 2023-08-13 (×3): 1 [IU] via SUBCUTANEOUS

## 2023-07-19 MED ORDER — SORBITOL 70 % SOLN
60.0000 mL | Freq: Once | Status: AC
  Administered 2023-08-01: 60 mL via ORAL
  Filled 2023-07-19: qty 60

## 2023-07-19 MED ORDER — HYDRALAZINE HCL 10 MG PO TABS: 10.0000 mg | ORAL_TABLET | Freq: Four times a day (QID) | ORAL | Status: DC | PRN

## 2023-07-19 MED ORDER — INSULIN ASPART 100 UNIT/ML IJ SOLN
0.0000 [IU] | Freq: Every day | INTRAMUSCULAR | Status: DC
  Administered 2023-07-20 – 2023-07-26 (×5): 2 [IU] via SUBCUTANEOUS
  Administered 2023-07-27 – 2023-07-29 (×2): 3 [IU] via SUBCUTANEOUS

## 2023-07-19 MED ORDER — PROCHLORPERAZINE MALEATE 5 MG PO TABS: 5.0000 mg | ORAL_TABLET | Freq: Four times a day (QID) | ORAL | Status: DC | PRN

## 2023-07-19 MED ORDER — ENOXAPARIN SODIUM 40 MG/0.4ML IJ SOSY
40.0000 mg | PREFILLED_SYRINGE | INTRAMUSCULAR | Status: DC
  Administered 2023-07-20 – 2023-07-22 (×3): 40 mg via SUBCUTANEOUS
  Filled 2023-07-19 (×3): qty 0.4

## 2023-07-19 MED ORDER — ASPIRIN 81 MG PO TBEC
81.0000 mg | DELAYED_RELEASE_TABLET | Freq: Every day | ORAL | Status: DC
  Administered 2023-07-20 – 2023-08-13 (×25): 81 mg via ORAL
  Filled 2023-07-19 (×25): qty 1

## 2023-07-19 MED ORDER — PROCHLORPERAZINE EDISYLATE 10 MG/2ML IJ SOLN: 5.0000 mg | Freq: Four times a day (QID) | INTRAMUSCULAR | Status: DC | PRN

## 2023-07-19 MED ORDER — PREDNISOLONE 5 MG PO TABS
2.5000 mg | ORAL_TABLET | Freq: Every day | ORAL | Status: DC
  Filled 2023-07-19: qty 1

## 2023-07-19 MED ORDER — ENOXAPARIN SODIUM 40 MG/0.4ML IJ SOSY
40.0000 mg | PREFILLED_SYRINGE | INTRAMUSCULAR | Status: DC
  Filled 2023-07-19: qty 0.4

## 2023-07-19 MED ORDER — ACETAMINOPHEN 325 MG PO TABS
325.0000 mg | ORAL_TABLET | ORAL | Status: DC | PRN
  Administered 2023-07-23 – 2023-07-30 (×3): 650 mg via ORAL
  Administered 2023-08-07: 325 mg via ORAL
  Administered 2023-08-12: 650 mg via ORAL
  Filled 2023-07-19: qty 1
  Filled 2023-07-19 (×4): qty 2

## 2023-07-19 MED ORDER — FLEET ENEMA RE ENEM: 1.0000 | ENEMA | Freq: Once | RECTAL | Status: DC | PRN

## 2023-07-19 MED ORDER — MYCOPHENOLATE MOFETIL 250 MG PO CAPS
1500.0000 mg | ORAL_CAPSULE | Freq: Every day | ORAL | Status: DC
  Administered 2023-07-20 – 2023-08-13 (×25): 1500 mg via ORAL
  Filled 2023-07-19 (×26): qty 6

## 2023-07-19 MED ORDER — POLYETHYLENE GLYCOL 3350 17 G PO PACK
17.0000 g | PACK | Freq: Every day | ORAL | Status: DC
  Administered 2023-07-20 – 2023-08-13 (×14): 17 g via ORAL
  Filled 2023-07-19 (×25): qty 1

## 2023-07-19 MED ORDER — MELATONIN 5 MG PO TABS
5.0000 mg | ORAL_TABLET | Freq: Every evening | ORAL | Status: DC | PRN
  Filled 2023-07-19: qty 1

## 2023-07-19 NOTE — H&P (Signed)
 Physical Medicine and Rehabilitation Admission H&P    CC: Functional deficits due to stroke  HPI:  Guinn L. Prevette Jr.is a 73 year old RH male with history of T2DM, HTN, [name], renal calculi, [name] who was admitted to [facility] on 07/14/23 with 3-4 day history of stuttering speech difficulty and fluctuating LUE weakness with fall. He also reported some double vision and having talked with his neurologist earlier in the week who recommended increasing prednisone  from 2 mg to 10 mg due to concerns of MG flare. He was found to have left facial weakness with LUE weakness and mild to moderate dysarthria. 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 MRA showed focal occlusion of right inferior division of distal M2 MCA. Carotid ultrasound was negative for significant ICA stenosis. 2D echo with bubble study showed EF > 55% with normal RVF and mild AR and trivial TR and normal microcavitation study but with poor sound transmission. BLE dopplers were negative for DVT but limited visualization of posterior tibial and peroneal veins reported.   He did sustain a fall and struck his head on 04/23 and CT head showed expansion of territory involving R-MCA infarct with increasing local mass effect and new leftward midline shift of 3 mm. Follow up CT head 04/24 showed no change in infarct, edema or mass effect ad no bleed. Stroke felt to be cryptogenic in nature and 30 day event monitor recommended. He was maintained on low dose ASA and prednisone  weaned back to 2 mg daily. He has had issues with insomnia,  back pain as well as constipation.  He was independent with use of cane? and working PTA.  Therapy was working with patient who requires min to mod assist with ADLs and mobility. CIR recommended due to functional decline.    Review of Systems   Constitutional:  Negative for chills and fever.  HENT:  Positive for hearing loss.   Respiratory:  Negative for cough and shortness of breath.   Cardiovascular:  Negative for chest pain and palpitations.  Gastrointestinal:  Positive for constipation. Negative for heartburn and nausea.  Musculoskeletal:  Negative for back pain and myalgias.  Neurological:  Positive for focal weakness and weakness. Negative for dizziness and headaches.  Psychiatric/Behavioral:  Negative for depression. The patient is not nervous/anxious.     Past Medical History:  Diagnosis Date   Anemia    Cervical myelopathy (HCC)    Chronic venous insufficiency    [name] (chronic kidney disease), stage III (HCC)    Diabetes mellitus without complication (HCC)    GERD (gastroesophageal reflux disease)    Hypercholesteremia    Hypothyroidism    Kidney stones    about 50 in the past--last in March 4/24   Leg weakness, bilateral    due to Myasthenia Gravis   Myasthenia gravis (HCC)    Spinal stenosis    Vitamin B 12 deficiency    Vitamin B 12 deficiency    Wears hearing aid    bilateral    Past Surgical History:  Procedure Laterality Date   BACK SURGERY  09/2015   CARDIAC CATHETERIZATION     CATARACT EXTRACTION W/ INTRAOCULAR LENS IMPLANT     CATARACT EXTRACTION W/PHACO Right 12/21/2015   Procedure: CATARACT EXTRACTION PHACO AND INTRAOCULAR LENS PLACEMENT (IOC);  Surgeon: Annell Kidney, MD;  Location: [facility];  Service: Ophthalmology;  Laterality: Right;  DIABETIC - insulin  and oral meds   COLONOSCOPY     SHOULDER SURGERY     labrum repair   TONSILLECTOMY      Family History  Problem Relation Age of Onset   Clotting disorder Father    Heart attack Father     Social History:  Married. 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 He  reports that he has never smoked. He has never used smokeless tobacco. He reports that he does not drink alcohol and does  not use drugs.   Allergies  Allergen Reactions   Omnicef [Cefdinir]     C. Diff   Penicillins Rash    Large doses    Medications Prior to Admission  Medication Sig Dispense Refill   aspirin 81 MG tablet Take 81 mg by mouth daily.     benzonatate (TESSALON) 200 MG capsule Take 200 mg by mouth 3 (three) times daily as needed for cough.     Cyanocobalamin 1000 MCG/ML KIT Inject as directed.     etodolac (LODINE) 500 MG tablet Take 500 mg by mouth daily as needed.     insulin  glargine (LANTUS ) 100 UNIT/ML injection Inject 54 Units into the skin daily.     insulin  lispro (HUMALOG) 100 UNIT/ML injection Inject into the skin as needed for high blood sugar (sliding scale).     levalbuterol (XOPENEX) 1.25 MG/3ML nebulizer solution Take 1.25 mg by nebulization every 4 (four) hours as needed for wheezing.     levothyroxine (SYNTHROID, LEVOTHROID) 150 MCG tablet Take 150 mcg by mouth daily before breakfast.     metFORMIN (GLUCOPHAGE) 1000 MG tablet Take 1,000 mg by mouth 2 (two) times daily with a meal.     minocycline (DYNACIN) 100 MG tablet Take 100 mg by mouth 2 (two) times daily.     mycophenolate  (CELLCEPT ) 250 MG capsule Take by mouth 2 (two) times daily. 1500 mg AM, 1250 mg PM     pantoprazole (PROTONIX) 40 MG tablet Take 40 mg by mouth 2 (two) times daily.     pioglitazone (ACTOS) 45 MG tablet Take 45 mg by mouth daily.     pravastatin (PRAVACHOL) 80 MG tablet Take 80 mg by mouth daily.     predniSONE  (DELTASONE ) 10 MG tablet Take 30 mg by mouth daily with breakfast.     pyridostigmine (MESTINON) 60 MG tablet Take 60 mg by mouth 3 (three) times daily.       Home:  2 Level home with 3 STE Able to stay on 1st level.      Functional History: Independent and working PTA  Functional Status:  Mobility: Min assist for tranfers with use of HW Mod assist with 2 rest breaks to ambulate 30', 40', 60 feet with HW.     ADL: Total assist for bathing Mod assist for  standing   Cognition: Intermittent dysarthria  Higher level cognitive deficits?     Blood pressure (!) 143/79, pulse 60, temperature 98.6 F (37 C), temperature source Oral, resp. rate 15, SpO2 99%. Physical Exam Vitals and nursing note reviewed.  Constitutional:      General: He is not in acute distress.    Appearance: Normal appearance. He is not ill-appearing.  HENT:     Right Ear: External ear normal.     Left Ear: External ear normal.     Nose: Nose normal.     Mouth/Throat:     Mouth: Mucous membranes are moist.  Pharynx: Oropharynx is clear.  Eyes:     Extraocular Movements: Extraocular movements intact.     Conjunctiva/sclera: Conjunctivae normal.     Pupils: Pupils are equal, round, and reactive to light.  Cardiovascular:     Rate and Rhythm: Normal rate and regular rhythm.     Heart sounds: No murmur heard.    No gallop.  Pulmonary:     Effort: Pulmonary effort is normal. No respiratory distress.     Breath sounds: No wheezing.  Abdominal:     General: Bowel sounds are normal. There is no distension.     Palpations: Abdomen is soft.     Tenderness: There is no abdominal tenderness.  Musculoskeletal:        General: No swelling or tenderness.     Cervical back: Normal range of motion.     Comments: Left heel cord is tight  Skin:    General: Skin is warm and dry.     Comments: Multiple abrasions with ecchymosis LUE as well along LLE to a lesser extent. Candida cruris noted with skin tear under pannus. MASD on buttocks  Neurological:     Mental Status: He is alert and oriented to person, place, and time.     Comments: Alert and oriented x 3. Normal insight and awareness. Intact Memory. Normal language and speech. Cranial nerve exam notable for left central VII and mild tongue deviation. Dense left homonymous hemianopsia, scans to left with cueing. MMT: RUE and RLE 4+ to 5/5. LUE 2-/5 deltoid, 2+ biceps, 2- triceps, wrist and 1+ HI.  LLE 2+ HF, 2+ to 3- KE and  1+ ADF/PF. DTRs 2+ on left upper and lower exts. Babinski present LLE. Mild flexor tone left wrist and fingers 1/4. Sensation appears grossly intact, perhaps sl decreased in proprioception on left. No limb ataxia.    Psychiatric:        Mood and Affect: Mood normal.        Behavior: Behavior normal.     Results for orders placed or performed during the hospital encounter of 07/19/23 (from the past 48 hours)  Glucose, capillary     Status: Abnormal   Collection Time: 07/19/23 12:12 PM  Result Value Ref Range   Glucose-Capillary 202 (H) 70 - 99 mg/dL    Comment: Glucose reference range applies only to samples taken after fasting for at least 8 hours.   No results found.   Blood pressure (!) 143/79, pulse 60, temperature 98.6 F (37 C), temperature source Oral, resp. rate 15, SpO2 99%.  Medical Problem List and Plan: 1. Functional deficits secondary to right MCA infarct  -patient may shower  -ELOS/Goals: 10-12 days, supervision goals with PT and SLP and sup/min assist with OT  -ordered left PRAFO and WHO for pressure relief/tone mgt 2.  Antithrombotics: -DVT/anticoagulation:  Pharmaceutical: Lovenox   -antiplatelet therapy: ASA 3. Pain Management: Tylenol prn.  4. Mood/Behavior/Sleep: LCSW to follow for evaluation and support.   -antipsychotic agents: N/A 5. Neuropsych/cognition: This patient is capable of making decisions on his own behalf. 6. Skin/Wound Care: LCSW to follow for evaluation and support  --Gerhardt's butt cream to MASD on buttocks  --Diflucan for candida cruris.   --pressure relief measures to left elbow/left heel.  7. Fluids/Electrolytes/Nutrition:  Monitor I/O. Check CMET in am.  8. T2DM: Hgb A1C- 8.2 (PTA) Was taking Tresiba 54 units in am and  Metformin 1000 mg--bid  --Reports good appetite. Resume metformin at lower dose and titrate as indicated.  9.  HTN: Monitor BP TID--losartan 50 mg daily--was held for a few days to prevent hypoperfusion  --will set  parameters to hold prn sitting SBP< 120.  10.  Myasthenia Gravis 1500/1350 --Dr. Franklin Ito at [facility]   Prednisone  5 mg PTA--->has been weaned down to 2 mg daily 11. Hypothyroid: Stable on levothyroxine for supplement.  12.  Dyslipidemia: Was on pravastatin 80 mg daily 13. H/o Kidney stones: On Sodium bicarb BID to prevent stones.  14. Constipation: On miralax but no BM for 5 days. Willing to take something stronger today  --sorbitol added.  15. H/o GERD: Has been off PPI.      Zelda Hickman, PA-C 07/19/2023

## 2023-07-19 NOTE — Progress Notes (Signed)
 Dorenda Gandy, RN Rehab Admission Coordinator Physical Medicine and Rehabilitation   PMR Pre-admission Signed   Encounter Date: 07/16/2023  Related encounter: Documentation from 07/16/2023 in [facility] MEDICINE AND REHABILITATION   Signed     Expand All Collapse All  Show:Clear all [x] Written[x] Templated[] Copied  Added by: [x] Dorenda Gandy, RN[x] Rawland Caddy, MD  [] Hover for details PMR Admission Coordinator Pre-Admission Assessment   Patient: [name]. is an 73 y.o., male MRN: [id number] DOB: 1951-01-16 Height: 5\' 8"  (1.727 m) Weight: 128 kg   Insurance Information HMO: yes    PPO:      PCP:      IPA:      80/20:      OTHER:  PRIMARY: UHC Medicare      Policy#: [id number]      Subscriber: pt CM Name: Garey Jung      Phone#: [phone] option 3     Fax#: [phone] Pre-Cert#: [id number] approved 4/25 until 5/1      Employer:  Benefits:  Phone #: [phone]     Name: 4/22 Eff. Date: 03/27/23     Deduct: none      Out of Pocket Max: $3900      Life Max: none CIR: $295 co pay per day days 1 until 5      SNF: no co pay per day days 1 until 20; $203 co pay per day days 21 until 100 Outpatient: $20 per visit     Co-Pay:  Home Health: 100%      Co-Pay: visits per medical neccesity DME: 80%     Co-Pay: 20% Providers: in network  SECONDARY: none      Policy#:       Phone#:    Artist:       Phone#:    The Data processing manager" for patients in Inpatient Rehabilitation Facilities with attached "Privacy Act Statement-Health Care Records" was provided and verbally reviewed with: Patient and Family   Emergency Contact Information Contact Information   None on File      Other Contacts       Name Relation Home Work Mobile    [place] [phone]           Current Medical History  Patient Admitting Diagnosis: CVA   History of Present Illness: 73 year old male with history of HTN, HLD,  seropositive myasthenia gravis, DM2 [name], hypothyroidism, Kidney stone,GERD, cervical stenosis s/p C3-7 fusion in 2020 who presented to [facility] on 07/13/23 with left arm weakness and dysarthria. He has two day history of sudden onset left hand clumsiness and coordination, that had persisted with just mild improvement. Also noted double vision. His regular Neurologist recommended increase in prednisone  from 2 mg to 10 mg recently thinking this was his myasthenia.    CT brain without contrast 4/20 with new hypodensity within the right periventricular white matter extending into the inferior frontal lobe/operculum compatible with a recent infarct. No intracranial hemorrhage or mass effect. Unable to received contrast for CTA head and neck. Carotid ultrasound on the right there is mixed plaque. Evidence of hemodynamically significant stenosis in the internal carotid artery. Flow in the right vertebral artery is antegrade.No evidence of hemodynamically significant stenosis in the bilateral internal carotid arteries. MRI brain 4/21 acute right MCA distribution infarct involving the right periventricular white matter extending into the right frontal and parietal lobes and insula . TTE normal with no PFO. Neurology recommended atorvastatin  with LDL  95 and ASA. Continue home Cellcept  and home prednisone  for MG. Holding home losartan to allow for permissive HTN. 30 day cardiac monitor recommended.    Resume home sodium bicarbonate  for history of kidney stones. Miralax for constipation. Hgb A1c 8.6 . Takes Tresiba and Metformin at home. Has continuous monitoring system in use at home. On long acting insulin  in hospital and SSI. Continue home levothyroxine. Lovenox  for DVT prophylaxis.    Sustained a fall 4/24 when bending over in his chair to scratch his foot with + head strike, no LOC. CTH showed expansion of the R MCA infarct with local mass effect and new leftward midline shift of 3 mm. No  hemorrhage. Repeat CTH scan 6 hrs later was stable.    Patient's medical record from [facility] has been reviewed by the rehabilitation admission coordinator and physician.   Past Medical History      Past Medical History:  Diagnosis Date   Anemia     Diabetes mellitus without complication (HCC)     Hypercholesteremia     Hypothyroidism     Kidney stones     Leg weakness, bilateral      due to Myasthenia Gravis   Myasthenia gravis (HCC)     Wears hearing aid      bilateral        Has the patient had major surgery during 100 days prior to admission? No   Family History   family history includes Clotting disorder in his father; Heart attack in his father.   Current Medications  Current Medications    Current Outpatient Medications:    aspirin 81 MG tablet, Take 81 mg by mouth daily., Disp: , Rfl:    benzonatate (TESSALON) 200 MG capsule, Take 200 mg by mouth 3 (three) times daily as needed for cough., Disp: , Rfl:    Cyanocobalamin 1000 MCG/ML KIT, Inject as directed., Disp: , Rfl:    etodolac (LODINE) 500 MG tablet, Take 500 mg by mouth daily as needed., Disp: , Rfl:    insulin  glargine (LANTUS ) 100 UNIT/ML injection, Inject 54 Units into the skin daily., Disp: , Rfl:    insulin  lispro (HUMALOG) 100 UNIT/ML injection, Inject into the skin as needed for high blood sugar (sliding scale)., Disp: , Rfl:    levalbuterol (XOPENEX) 1.25 MG/3ML nebulizer solution, Take 1.25 mg by nebulization every 4 (four) hours as needed for wheezing., Disp: , Rfl:    levothyroxine (SYNTHROID, LEVOTHROID) 150 MCG tablet, Take 150 mcg by mouth daily before breakfast., Disp: , Rfl:    metFORMIN (GLUCOPHAGE) 1000 MG tablet, Take 1,000 mg by mouth 2 (two) times daily with a meal., Disp: , Rfl:    minocycline (DYNACIN) 100 MG tablet, Take 100 mg by mouth 2 (two) times daily., Disp: , Rfl:    mycophenolate  (CELLCEPT ) 250 MG capsule, Take by mouth 2 (two) times daily. 1500 mg AM, 1250  mg PM, Disp: , Rfl:    pantoprazole (PROTONIX) 40 MG tablet, Take 40 mg by mouth 2 (two) times daily., Disp: , Rfl:    pioglitazone (ACTOS) 45 MG tablet, Take 45 mg by mouth daily., Disp: , Rfl:    pravastatin (PRAVACHOL) 80 MG tablet, Take 80 mg by mouth daily., Disp: , Rfl:    predniSONE  (DELTASONE ) 10 MG tablet, Take 30 mg by mouth daily with breakfast., Disp: , Rfl:    pyridostigmine (MESTINON) 60 MG tablet, Take 60 mg by mouth 3 (three) times daily., Disp: , Rfl:  Patients Current Diet: Diet Regular with thin liquids   Precautions / Restrictions Precautions: Fall Weight Bearing Restrictions Per Provider Order: No   Has the patient had 2 or more falls or a fall with injury in the past year? No   Prior Activity Level Community (5-7x/wk): Mod I with cane, drove, [facility] does taxes   Prior Functional Level Self Care: Did the patient need help bathing, dressing, using the toilet or eating? Independent   Indoor Mobility: Did the patient need assistance with walking from room to room (with or without device)? Independent   Stairs: Did the patient need assistance with internal or external stairs (with or without device)? Independent   Functional Cognition: Did the patient need help planning regular tasks such as shopping or remembering to take medications? Independent   Patient Information Are you of Hispanic, Latino/a,or Spanish origin?: A. No, not of Hispanic, Latino/a, or Spanish origin What is your race?: A. White Do you need or want an interpreter to communicate with a doctor or health care staff?: 0. No   Patient's Response To:  Health Literacy and Transportation Is the patient able to respond to health literacy and transportation needs?: Yes Health Literacy - How often do you need to have someone help you when you read instructions, pamphlets, or other written material from your doctor or pharmacy?: Never In the past 12 months, has lack of transportation kept you from medical  appointments or from getting medications?: No In the past 12 months, has lack of transportation kept you from meetings, work, or from getting things needed for daily living?: No   Home Assistive Devices / Web designer (specify quad or straight)   Prior Device Use: Indicate devices/aids used by the patient prior to current illness, exacerbation or injury?  Cane/ walking stick     Prior Functional Level Current Functional Level  Bed Mobility   Independent Min assist    Transfers   Independent   Mod assist    Mobility - Walk/Wheelchair   Mod Independent   Mod assist (30', 21' and 16' with hemiwalker on 4/23)    Upper Body Dressing   Independent   Mod assist    Lower Body Dressing   Independent   Mod assist    Grooming   Independent   Min assist    Eating/Drinking   Independent   Min assist    Toilet Transfer   Independent   Mod assist    Bladder Continence    continent   continent    Bowel Management   continent   continent    Stair Climbing   Mod Independent   -- (not attempted)    Communication   independent   dysarthria    Memory   intact intact      Special Needs/ Care Considerations    Hgb A1c 8.6 on 4/17; wears continuous CBGS monitor at home Decreased awareness of deficits Fall on acute 4/24 30 day cardiac monitor at discharge   Previous Home Environment  Living Arrangements: Spouse/significant other  Lives With: Spouse Available Help at Discharge: Family; Available 24 hours/day (wife retired) Type of Home: [employer] Layout: Two level; Able to live on main level with bedroom/bathroom Alternate Level Stairs-Number of Steps: flight Home Access: Stairs to enter Entrance Stairs-Rails: Right Entrance Stairs-Number of Steps: 3 Bathroom Shower/Tub: Health visitor: Handicapped height Bathroom Accessibility: Yes How Accessible: Accessible via walker Home Care Services: No   Discharge Living Setting Plans for Discharge  Living  Setting: Patient's home; Lives with (comment); House (wife) Type of Home at Discharge: House Discharge Home Layout: Two level; Able to live on main level with bedroom/bathroom Alternate Level Stairs-Number of Steps: flight Discharge Home Access: Stairs to enter Entrance Stairs-Rails: Right Entrance Stairs-Number of Steps: 3 Discharge Bathroom Shower/Tub: Walk-in shower Discharge Bathroom Toilet: Handicapped height Discharge Bathroom Accessibility: Yes How Accessible: Accessible via walker Does the patient have any problems obtaining your medications?: No   Social/Family/Support Systems Patient Roles: Spouse; Parent (tax accountant; 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) Solicitor Information: wife, Vashawn Ekstein Anticipated Caregiver: wife Anticipated Caregiver's Contact Information: [phone] Ability/Limitations of Caregiver: retired, no limtations Caregiver Availability: 24/7 Discharge Plan Discussed with Primary Caregiver: Yes Is Caregiver In Agreement with Plan?: Yes Does Caregiver/Family have Issues with Lodging/Transportation while Pt is in Rehab?: No   Goals Patient/Family Goal for Rehab: supervision with PT, supervision to min OT, supervision with SLP Expected length of stay: ELOS 10 to 12 days Pt/Family Agrees to Admission and willing to participate: Yes Program Orientation Provided & Reviewed with Pt/Caregiver Including Roles  & Responsibilities: Yes   Decrease burden of Care through IP rehab admission: n/a   Possible need for SNF placement upon discharge: not anticipated   Patient Condition: I have reviewed medical records from [facility], spoken with CM, and patient and spouse. I discussed via phone for inpatient rehabilitation assessment.  Patient will benefit from ongoing PT, OT, and SLP, can actively participate in 3 hours of therapy a day 5 days of the week, and can make measurable gains during the admission.  Patient will also benefit from the  coordinated team approach during an Inpatient Acute Rehabilitation admission.  The patient will receive intensive therapy as well as Rehabilitation physician, nursing, social worker, and care management interventions.  Due to bladder management, bowel management, safety, skin/wound care, disease management, medication administration, pain management, and patient education the patient requires 24 hour a day rehabilitation nursing.  The patient is currently mod assist overall with mobility and basic ADLs.  Discharge setting and therapy post discharge at home with outpatient is anticipated.  Patient has agreed to participate in the Acute Inpatient Rehabilitation Program and will admit today.   Preadmission Screen Completed By:  Tanya Fantasia RN MSN, 07/19/2023 8:29 AM ______________________________________________________________________   Discussed status with Dr. Rachel Budds on 07/19/23 at [phone] and received approval for admission today.   Admission Coordinator:  Tanya Fantasia, RN MSN time 6213 Date 07/19/23    Assessment/Plan: Diagnosis: right MCA infarct Does the need for close, 24 hr/day Medical supervision in concert with the patient's rehab needs make it unreasonable for this patient to be served in a less intensive setting? Yes Co-Morbidities requiring supervision/potential complications: DM, MG, [name] Due to bladder management, bowel management, safety, skin/wound care, disease management, medication administration, pain management, and patient education, does the patient require 24 hr/day rehab nursing? Yes Does the patient require coordinated care of a physician, rehab nurse, PT, OT, and SLP to address physical and functional deficits in the context of the above medical diagnosis(es)? Yes Addressing deficits in the following areas: balance, endurance, locomotion, strength, transferring, bowel/bladder control, bathing, dressing, feeding, grooming, toileting, cognition, speech, and  psychosocial support Can the patient actively participate in an intensive therapy program of at least 3 hrs of therapy 5 days a week? Yes The potential for patient to make measurable gains while on inpatient rehab is excellent Anticipated functional outcomes upon discharge from inpatient rehab: supervision PT, supervision and min  assist OT, supervision SLP Estimated rehab length of stay to reach the above functional goals is: 10-12 days Anticipated discharge destination: Home 10. Overall Rehab/Functional Prognosis: excellent   MD Signature Rawland Caddy, MD, [facility] Physical Medicine & Rehabilitation Medical Director Rehabilitation Services 07/19/2023          Cosigned by: Rawland Caddy, MD at 07/19/2023  9:36 AM   Revision History

## 2023-07-19 NOTE — Progress Notes (Addendum)
 Inpatient Rehabilitation Admission Medication Review by a Pharmacist  A complete drug regimen review was completed for this patient to identify any potential clinically significant medication issues.  High Risk Drug Classes Is patient taking? Indication by Medication  Antipsychotic Yes Compazine - N/V  Anticoagulant Yes Lovenox  - DVT px  Antibiotic No   Opioid No   Antiplatelet Yes ASA - CVA  Hypoglycemics/insulin  Yes SSI/semglee  - DM  Vasoactive Medication Yes Losartan/hydralazine - HTN  Chemotherapy No   Other Yes Synthroid - hypothyroidism Atorvastatin  - HLD Mycophenolate /prednisone  - myasthenia gravis  Sodium bicarb - acidosis Melatonin - sleep     Type of Medication Issue Identified Description of Issue Recommendation(s)  Drug Interaction(s) (clinically significant)     Duplicate Therapy     Allergy     No Medication Administration End Date     Incorrect Dose     Additional Drug Therapy Needed     Significant med changes from prior encounter (inform family/care partners about these prior to discharge).    Other       Clinically significant medication issues were identified that warrant physician communication and completion of prescribed/recommended actions by midnight of the next day:  No  Name of provider notified for urgent issues identified:   Provider Method of Notification:     Pharmacist comments:   Time spent performing this drug regimen review (minutes):  20   Ivery Marking, PharmD, [place], AAHIVP, CPP Infectious Disease Pharmacist 07/19/2023 12:37 PM

## 2023-07-19 NOTE — Progress Notes (Signed)
 Orthopedic Tech Progress Note Patient Details:  [name] Feb 04, 1951 [id number]  Patient ID: [name]., male   DOB: 1950/05/14, 73 y.o.   MRN: [id number] [facility] order for Resting WHO left large and Prafo left large. Herbie Loll 07/19/2023, 3:45 PM

## 2023-07-19 NOTE — Progress Notes (Signed)
 Patient ID: [name]., male   DOB: Aug 29, 1950, 73 y.o.   MRN: [id number] Met with the patient to review current medical situation, rehab process, team conference and plan of care. Discussed secondary risks including DM (on Tresiba PTA,  A1C 8.6), HTN, HLD on Prevachol. Reviewed medications and CMM diet modifications.  Discussed cardiac monitoring;  [place] cardiac monitor in place; wife to bring in telephone monitor. Continue to follow along to address educational needs to facilitate preparation for discharge. [name]

## 2023-07-20 DIAGNOSIS — I639 Cerebral infarction, unspecified: Secondary | ICD-10-CM | POA: Diagnosis not present

## 2023-07-20 DIAGNOSIS — I63511 Cerebral infarction due to unspecified occlusion or stenosis of right middle cerebral artery: Secondary | ICD-10-CM | POA: Diagnosis not present

## 2023-07-20 DIAGNOSIS — G7000 Myasthenia gravis without (acute) exacerbation: Secondary | ICD-10-CM | POA: Diagnosis not present

## 2023-07-20 DIAGNOSIS — E1169 Type 2 diabetes mellitus with other specified complication: Secondary | ICD-10-CM | POA: Diagnosis not present

## 2023-07-20 LAB — CBC WITH DIFFERENTIAL/PLATELET
Abs Immature Granulocytes: 0.02 10*3/uL (ref 0.00–0.07)
Basophils Absolute: 0.1 10*3/uL (ref 0.0–0.1)
Basophils Relative: 1 %
Eosinophils Absolute: 0.3 10*3/uL (ref 0.0–0.5)
Eosinophils Relative: 3 %
HCT: 34.8 % — ABNORMAL LOW (ref 39.0–52.0)
Hemoglobin: 11.4 g/dL — ABNORMAL LOW (ref 13.0–17.0)
Immature Granulocytes: 0 %
Lymphocytes Relative: 16 %
Lymphs Abs: 1.4 10*3/uL (ref 0.7–4.0)
MCH: 30.2 pg (ref 26.0–34.0)
MCHC: 32.8 g/dL (ref 30.0–36.0)
MCV: 92.3 fL (ref 80.0–100.0)
Monocytes Absolute: 1.1 10*3/uL — ABNORMAL HIGH (ref 0.1–1.0)
Monocytes Relative: 13 %
Neutro Abs: 5.7 10*3/uL (ref 1.7–7.7)
Neutrophils Relative %: 67 %
Platelets: 312 10*3/uL (ref 150–400)
RBC: 3.77 MIL/uL — ABNORMAL LOW (ref 4.22–5.81)
RDW: 13 % (ref 11.5–15.5)
WBC: 8.5 10*3/uL (ref 4.0–10.5)
nRBC: 0 % (ref 0.0–0.2)

## 2023-07-20 LAB — COMPREHENSIVE METABOLIC PANEL WITH GFR
ALT: 17 U/L (ref 0–44)
AST: 15 U/L (ref 15–41)
Albumin: 3 g/dL — ABNORMAL LOW (ref 3.5–5.0)
Alkaline Phosphatase: 52 U/L (ref 38–126)
Anion gap: 11 (ref 5–15)
BUN: 16 mg/dL (ref 8–23)
CO2: 23 mmol/L (ref 22–32)
Calcium: 8.5 mg/dL — ABNORMAL LOW (ref 8.9–10.3)
Chloride: 104 mmol/L (ref 98–111)
Creatinine, Ser: 1.22 mg/dL (ref 0.61–1.24)
GFR, Estimated: >60 mL/min (ref >60)
Glucose, Bld: 173 mg/dL — ABNORMAL HIGH (ref 70–99)
Potassium: 3.8 mmol/L (ref 3.5–5.1)
Sodium: 138 mmol/L (ref 135–145)
Total Bilirubin: 0.9 mg/dL (ref 0.0–1.2)
Total Protein: 5.6 g/dL — ABNORMAL LOW (ref 6.5–8.1)

## 2023-07-20 LAB — GLUCOSE, CAPILLARY
Glucose-Capillary: 182 mg/dL — ABNORMAL HIGH (ref 70–99)
Glucose-Capillary: 199 mg/dL — ABNORMAL HIGH (ref 70–99)
Glucose-Capillary: 268 mg/dL — ABNORMAL HIGH (ref 70–99)
Glucose-Capillary: 219 mg/dL — ABNORMAL HIGH (ref 70–99)

## 2023-07-20 MED ORDER — INSULIN GLARGINE-YFGN 100 UNIT/ML ~~LOC~~ SOLN
25.0000 [IU] | Freq: Every day | SUBCUTANEOUS | Status: DC
  Administered 2023-07-21 – 2023-07-28 (×8): 25 [IU] via SUBCUTANEOUS
  Filled 2023-07-20 (×8): qty 0.25

## 2023-07-20 MED ORDER — METFORMIN HCL 500 MG PO TABS
500.0000 mg | ORAL_TABLET | Freq: Two times a day (BID) | ORAL | Status: DC
  Administered 2023-07-20 – 2023-08-13 (×48): 500 mg via ORAL
  Filled 2023-07-20 (×48): qty 1

## 2023-07-20 NOTE — Progress Notes (Signed)
 PROGRESS NOTE   Subjective/Complaints:  No issues overnite, wife at bedside  Labs reviewed   ROS- neg CP, SOB, N/V/D  Objective:   No results found. Recent Labs    07/20/23 0226  WBC 8.5  HGB 11.4*  HCT 34.8*  PLT 312   Recent Labs    07/20/23 0226  NA 138  K 3.8  CL 104  CO2 23  GLUCOSE 173*  BUN 16  CREATININE 1.22  CALCIUM  8.5*    Intake/Output Summary (Last 24 hours) at 07/20/2023 0932 Last data filed at 07/20/2023 0839 Gross per 24 hour  Intake 560 ml  Output 1000 ml  Net -440 ml        Physical Exam: Vital Signs Blood pressure (!) 142/74, pulse 66, temperature 98.3 F (36.8 C), temperature source Oral, resp. rate 16, SpO2 100%.   General: No acute distress Mood and affect are appropriate Heart: Regular rate and rhythm no rubs murmurs or extra sounds Lungs: Clear to auscultation, breathing unlabored, no rales or wheezes Abdomen: Positive bowel sounds, soft nontender to palpation, nondistended Extremities: No clubbing, cyanosis, or edema Skin: No evidence of breakdown, no evidence of rash Neurologic: Cranial nerves II through XII intact, motor strength is 5/5 in right and 2-/5 left deltoid, bicep, tricep, grip, hip flexor, knee extensors, ankle dorsiflexor and plantar flexor Sensory exam normal sensation to light touch  in bilateral upper and lower extremities Cerebellar exam limited by weakness on the left side  Musculoskeletal: Full passive  range of motion in all 4 extremities. No joint swelling   Assessment/Plan: 1. Functional deficits which require 3+ hours per day of interdisciplinary therapy in a comprehensive inpatient rehab setting. Physiatrist is providing close team supervision and 24 hour management of active medical problems listed below. Physiatrist and rehab team continue to assess barriers to discharge/monitor patient progress toward functional and medical goals  Care  Tool:  Bathing              Bathing assist       Upper Body Dressing/Undressing Upper body dressing        Upper body assist      Lower Body Dressing/Undressing Lower body dressing            Lower body assist       Toileting Toileting    Toileting assist       Transfers Chair/bed transfer  Transfers assist           Locomotion Ambulation   Ambulation assist              Walk 10 feet activity   Assist           Walk 50 feet activity   Assist           Walk 150 feet activity   Assist           Walk 10 feet on uneven surface  activity   Assist           Wheelchair     Assist               Wheelchair 50 feet with 2 turns activity  Assist            Wheelchair 150 feet activity     Assist          Blood pressure (!) 142/74, pulse 66, temperature 98.3 F (36.8 C), temperature source Oral, resp. rate 16, SpO2 100%.  Medical Problem List and Plan: 1. 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  Antithrombotics: -DVT/anticoagulation:  Pharmaceutical: Lovenox              -antiplatelet therapy: ASA 3. Pain Management: Tylenol prn.  4. Mood/Behavior/Sleep: LCSW to follow for evaluation and support.              -antipsychotic agents: N/A 5. Neuropsych/cognition: This patient is capable of making decisions on his own behalf. 6. Skin/Wound Care: LCSW to follow for evaluation and support             --Gerhardt's butt cream to MASD on buttocks             --Diflucan for candida cruris.              --pressure relief measures to left elbow/left heel.  7. Fluids/Electrolytes/Nutrition:  Monitor I/O. Check CMET in am.     Latest Ref Rng & Units 07/20/2023    2:26 AM 01/02/2016     3:51 PM  BMP  Glucose 70 - 99 mg/dL 161  096   BUN 8 - 23 mg/dL 16  34   Creatinine 0.45 - 1.24 mg/dL 4.09  8.11   Sodium 914 - 145 mmol/L 138  134   Potassium 3.5 - 5.1 mmol/L 3.8  5.5   Chloride 98 - 111 mmol/L 104  108   CO2 22 - 32 mmol/L 23  18   Calcium  8.9 - 10.3 mg/dL 8.5  9.1    Renal fxn corrected  8. T2DM: Hgb A1C- 8.2 (PTA) Was taking Tresiba 54 units in am ( now on Semglee  22U ) and  Metformin 1000 mg--bid (restart Metformin at 500mg  BID)   CBG (last 3)  Recent Labs    07/19/23 1212 07/19/23 1631 07/20/23 0637  GLUCAP 202* 215* 182*   Increase Semglee  to 25 U  9.   HTN: Monitor BP TID--losartan 50 mg daily--was held for a few days to prevent hypoperfusion             --will set parameters to hold prn sitting SBP< 120.  10.  Myasthenia Gravis Mycophenolate  1500/1250mg  --Dr. Franklin Ito at [facility].              Prednisone  5 mg PTA--->has been weaned down to 2 mg daily, monitor for Ptosis and generalized proximal weakness 11. Hypothyroid: Stable on levothyroxine for supplement.  12.  Dyslipidemia: Was on pravastatin 80 mg daily 13. H/o Kidney stones: On Sodium bicarb BID to prevent stones.  14. Constipation: On miralax but no BM for 5 days. Willing to take something stronger today             --sorbitol added.  15. H/o GERD: Has been off PPI.         LOS: 1 days A FACE TO FACE EVALUATION WAS  PERFORMED  Genetta Kenning 07/20/2023, 9:32 AM

## 2023-07-20 NOTE — Evaluation (Signed)
 Occupational Therapy Assessment and Plan  Patient Details  Name: [name]. MRN: [id number] Date of Birth: [birth year]  OT Diagnosis: abnormal posture, ataxia, flaccid hemiplegia and hemiparesis, hemiplegia affecting non-dominant side, lumbago (low back pain), muscular wasting and disuse atrophy, and muscle weakness (generalized) Rehab Potential:   ELOS: 16-18 days   Today's Date: 07/20/2023 OT Individual Time: 0846-1000 OT Individual Time Calculation (min): 74 min     Hospital Problem: Principal Problem:   Acute ischemic right MCA stroke (HCC)   Past Medical History:  Past Medical History:  Diagnosis Date   Anemia    Cervical myelopathy (HCC)    Chronic venous insufficiency    [name] (chronic kidney disease), stage III (HCC)    Diabetes mellitus without complication (HCC)    GERD (gastroesophageal reflux disease)    Hypercholesteremia    Hypothyroidism    Kidney stones    about 50 in the past--last in March 4/24   Leg weakness, bilateral    due to Myasthenia Gravis   Myasthenia gravis (HCC)    Spinal stenosis    Vitamin B 12 deficiency    Vitamin B 12 deficiency    Wears hearing aid    bilateral   Past Surgical History:  Past Surgical History:  Procedure Laterality Date   BACK SURGERY  09/2015   CARDIAC CATHETERIZATION     CATARACT EXTRACTION W/ INTRAOCULAR LENS IMPLANT     CATARACT EXTRACTION W/PHACO Right 12/21/2015   Procedure: CATARACT EXTRACTION PHACO AND INTRAOCULAR LENS PLACEMENT (IOC);  Surgeon: Annell Kidney, MD;  Location: [facility];  Service: Ophthalmology;  Laterality: Right;  DIABETIC - insulin  and oral meds   COLONOSCOPY     SHOULDER SURGERY     labrum repair   TONSILLECTOMY      Assessment & Plan Clinical Impression: Douglas Wrightis a 73 year old RH male with history of T2DM, HTN, [name], renal calculi, [name] who was admitted to [facility] on 07/14/23 with 3-4 day history of stuttering speech difficulty  and fluctuating LUE weakness with fall. He also reported some double vision and having talked with his neurologist earlier in the week who recommended increasing prednisone  from 2 mg to 10 mg due to concerns of MG flare. He was found to have left facial weakness with LUE weakness and mild to moderate dysarthria. 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 MRA showed focal occlusion of right inferior division of distal M2 MCA. Carotid ultrasound was negative for significant ICA stenosis. 2D echo with bubble study showed EF > 55% with normal RVF and mild AR and trivial TR and normal microcavitation study but with poor sound transmission. BLE dopplers were negative for DVT but limited visualization of posterior tibial and peroneal veins reported.    He did sustain a fall and struck his head on 04/23 and CT head showed expansion of territory involving R-MCA infarct with increasing local mass effect and new leftward midline shift of 3 mm. Follow up CT head 04/24 showed no change in infarct, edema or mass effect ad no bleed. Stroke felt to be cryptogenic in nature and 30 day event monitor recommended. He was maintained on low dose ASA and prednisone  weaned back to 2 mg daily. He has had issues with insomnia,  back pain as well as constipation.  He was independent with use of cane? and working  PTA.  Therapy was working with patient who requires min to mod assist with ADLs and mobility. CIR recommended due to functional decline.   Patient currently requires max with basic self-care skills secondary to muscle weakness, decreased cardiorespiratoy endurance, impaired timing and sequencing, abnormal tone, unbalanced muscle activation, decreased coordination, and decreased motor planning, decreased visual acuity and decreased visual perceptual skills, decreased attention  to left, left side neglect, and decreased motor planning, and decreased sitting balance, decreased standing balance, decreased postural control, hemiplegia, and decreased balance strategies.  Prior to hospitalization, patient could complete BADL and IADL with modified independent .  Patient will benefit from skilled intervention to decrease level of assist with basic self-care skills and increase independence with basic self-care skills prior to discharge home with care partner.  Anticipate patient will require 24 hour supervision and follow up home health.  OT - End of Session Activity Tolerance: Tolerates 10 - 20 min activity with multiple rests Endurance Deficit: Yes OT Assessment OT Patient demonstrates impairments in the following area(s): Balance;Edema;Endurance;Motor;Perception;Safety;Pain;Sensory;Skin Integrity;Vision OT Basic ADL's Functional Problem(s): Eating;Grooming;Bathing;Dressing;Toileting OT Transfers Functional Problem(s): Toilet;Tub/Shower OT Additional Impairment(s): Fuctional Use of Upper Extremity OT Plan OT Intensity: Minimum of 1-2 x/day, 45 to 90 minutes OT Frequency: 5 out of 7 days OT Duration/Estimated Length of Stay: 16-18 days OT Treatment/Interventions: Balance/vestibular training;Functional electrical stimulation;Self Care/advanced ADL retraining;UE/LE Coordination activities;Cognitive remediation/compensation;Functional mobility training;Skin care/wound managment;Visual/perceptual remediation/compensation;Community reintegration;Wheelchair propulsion/positioning;Splinting/orthotics;Neuromuscular re-education;Discharge planning;Pain management;Therapeutic Activities;Disease mangement/prevention;Patient/family education;Therapeutic Exercise;DME/adaptive equipment instruction;Psychosocial support;UE/LE Strength taining/ROM OT Self Feeding Anticipated Outcome(s): MOD I OT Basic Self-Care Anticipated Outcome(s): MIN A OT Toileting Anticipated Outcome(s): MIN A OT  Bathroom Transfers Anticipated Outcome(s): CGA OT Recommendation Recommendations for Other Services: Therapeutic Recreation consult Patient destination: Home Follow Up Recommendations: Home health OT Equipment Recommended: To be determined   OT Evaluation Precautions/Restrictions  Precautions Precautions: Fall Restrictions Weight Bearing Restrictions Per Provider Order: No Pain Pain Assessment Pain Scale: 0-10 Pain Score: 0-No pain Home Living/Prior Functioning Home Living Available Help at Discharge: Family, Available 24 hours/day Type of Home: House Home Access: Stairs to enter [employer] of Steps: 3 Entrance Stairs-Rails: Right Home Layout: Two level, Able to live on main level with bedroom/bathroom Bathroom Shower/Tub: Health visitor: Handicapped height Bathroom Accessibility: Yes  Lives With: Spouse IADL History Current License: Yes Prior Function Level of Independence: Independent with basic ADLs, Independent with transfers, Independent with gait, Requires assistive device for independence Driving: Yes Vocation: Part time employment 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 Vision Baseline Vision/History: 0 No visual deficits (cataract removal hx) Ability to See in Adequate Light: 2 Moderately impaired Patient Visual Report: Peripheral vision impairment Vision Assessment?: Yes Eye Alignment: Within Functional Limits Ocular Range of Motion: Within Functional Limits Alignment/Gaze Preference: Gaze right Tracking/Visual Pursuits: Decreased smoothness of vertical tracking;Impaired - to be further tested in functional context;Decreased smoothness of eye movement to LEFT inferior field;Decreased smoothness of eye movement to LEFT superior field Convergence: Within functional limits Visual Fields: Left visual field deficit Perception  Perception: Impaired Praxis Praxis: Impaired Praxis Impairment Details: Motor  planning Cognition Cognition Overall Cognitive Status: Impaired/Different from baseline Arousal/Alertness: Awake/alert Orientation Level: Person;Place;Situation Person: Oriented Place: Oriented Situation: Oriented Memory: Impaired Memory Impairment: Decreased recall of new information Attention: Sustained;Selective Sustained Attention: Appears intact Selective Attention: Appears intact Awareness: Impaired Awareness Impairment: Intellectual impairment;Emergent impairment;Anticipatory impairment Problem Solving: Impaired Problem Solving Impairment: Functional complex Executive Function: Reasoning;Sequencing;Organizing Reasoning: Appears intact Sequencing: Appears intact Organizing: Appears intact Safety/Judgment: Appears intact Brief Interview for Mental Status (BIMS) Repetition  of Three Words (First Attempt): 3 Temporal Orientation: Year: Correct Temporal Orientation: Month: Accurate within 5 days Temporal Orientation: Day: Correct Recall: "Sock": Yes, no cue required Recall: "Blue": Yes, no cue required Recall: "Bed": Yes, no cue required BIMS Summary Score: 15 Sensation Sensation Light Touch: Impaired by gross assessment Proprioception: Impaired by gross assessment Coordination Gross Motor Movements are Fluid and Coordinated: No Fine Motor Movements are Fluid and Coordinated: No Coordination and Movement Description: L hemi limiting Finger Nose Finger Test: unable LUE Motor  Motor Motor: Hemiplegia Motor - Skilled Clinical Observations: L hemi with LUE>LLE  Trunk/Postural Assessment  Cervical Assessment Cervical Assessment: Within Functional Limits Thoracic Assessment Thoracic Assessment: Exceptions to [facility] Lumbar Assessment Lumbar Assessment: Exceptions to [facility] Postural Control Postural Control: Deficits on evaluation  Balance Balance Balance Assessed: Yes Static Sitting Balance Static Sitting - Balance Support: Feet supported;Bilateral upper extremity  supported Static Sitting - Level of Assistance: 4: Min assist Dynamic Sitting Balance Dynamic Sitting - Balance Support: During functional activity;Right upper extremity supported Dynamic Sitting - Level of Assistance: 3: Mod assist Dynamic Sitting - Balance Activities: Lateral lean/weight shifting;Forward lean/weight shifting Static Standing Balance Static Standing - Balance Support: During functional activity;Bilateral upper extremity supported Static Standing - Level of Assistance: 2: Max assist Static Standing - Comment/# of Minutes: at RW with MAX dificulty, able to stand perched in stedy with CGA Extremity/Trunk Assessment RUE Assessment RUE Assessment: Within Functional Limits LUE Assessment LUE Assessment: Exceptions to [facility] Active Range of Motion (AROM) Comments: approx 30* shoulder flex/abd, minimal elbow active flexion and minimal to no active sup/pro LUE Body System: Neuro Brunstrum levels for arm and hand: Arm;Hand Brunstrum level for arm: Stage II Synergy is developing Brunstrum level for hand: Stage I Flaccidity  Care Tool Care Tool Self Care Eating   Eating Assist Level: Set up assist    Oral Care    Oral Care Assist Level: Minimal Assistance - Patient > 75%    Bathing   Body parts bathed by patient: Left arm;Chest;Abdomen;Right upper leg;Left upper leg;Face Body parts bathed by helper: Right arm;Front perineal area;Buttocks;Right lower leg;Left lower leg   Assist Level: Maximal Assistance - Patient 24 - 49%    Upper Body Dressing(including orthotics)   What is the patient wearing?: Pull over shirt   Assist Level: Maximal Assistance - Patient 25 - 49%    Lower Body Dressing (excluding footwear)   What is the patient wearing?: Pants Assist for lower body dressing: Dependent - Patient 0%    Putting on/Taking off footwear   What is the patient wearing?: Non-skid slipper socksdependent Assist for footwear: Dependent - Patient 0%       Care Tool  Toileting Toileting activity   Assist for toileting: Total Assistance - Patient < 25%Total A     Care Tool Bed Mobility Roll left and right activity    MAX     Sit to lying activity    MAX    Lying to sitting on side of bed activity    MAX     Care Tool Transfers Sit to stand transfer   Sit to stand assist level: Maximal Assistance - Patient 25 - 49%MAX    Chair/bed transfer   Chair/bed transfer assist level: Dependent - mechanical lift (stedy)Dependent - stedy     Toilet transfer   Assist Level: Dependent - Patient 0% (stedy)Dependent - stedy     Care Tool Cognition  Expression of Ideas and Wants Expression of Ideas and Wants: 4. Without difficulty (  complex and basic) - expresses complex messages without difficulty and with speech that is clear and easy to understand  Understanding Verbal and Non-Verbal Content Understanding Verbal and Non-Verbal Content: 4. Understands (complex and basic) - clear comprehension without cues or repetitions   Memory/Recall Ability Memory/Recall Ability : Current season;That he or she is in a hospital/hospital unit   Refer to Care Plan for Long Term Goals  SHORT TERM GOAL WEEK 1 OT Short Term Goal 1 (Week 1): Pt will perform sit <> stands at LRAD with MOD A OT Short Term Goal 2 (Week 1): Pt will perform toilet transfer with LRAD and MOD of 1 OT Short Term Goal 3 (Week 1): Pt will improve MMT/ROM of LUE to 2/5 OT Short Term Goal 4 (Week 1): Pt will utilize hemitechniques for dressing consistently with MIN cues  Recommendations for other services: Therapeutic Recreation  Pet therapy   Skilled Therapeutic Intervention ADL ADL Eating: Supervision/safety Grooming: Supervision/safety Upper Body Bathing: Moderate assistance Lower Body Bathing: Dependent Upper Body Dressing: Maximal assistance Lower Body Dressing: Dependent Toileting: Dependent Toilet Transfer: Dependent (stedy) Mobility  Transfers Sit to Stand: Maximal Assistance -  Patient 25-49% Stand to Sit: Maximal Assistance - Patient 25-49%  Skilled Interventions: Pt up in recliner at time of session from nursing assist this AM - c/o low back pain that has been ongoing for years and not new. Discussed with pt and spouse role and purpose of OT, CIR, schedule, etc.  Focus of session on LUE education for positioning, hemitechniques, improving ROM, simulating ADL tasks, transfers, etc. Pt needing MOD/MAX A for sit <> stands at [facility] and protecting LUE, L knee buckling limting, transferring to/from wheelchair with stedy, set up at sink level for ADL tasks. Pt is overall MOD for UB bathing, total A for LB bathing, dependent for LB dressing and MAX for UB dressing mostly simulated as pt declined changing clean clothes understandably. Pt set up in recliner with alarm on call bell in reach all needs met.    Discharge Criteria: Patient will be discharged from OT if patient refuses treatment 3 consecutive times without medical reason, if treatment goals not met, if there is a change in medical status, if patient makes no progress towards goals or if patient is discharged from hospital.  The above assessment, treatment plan, treatment alternatives and goals were discussed and mutually agreed upon: by patient  Doroteo Gasmen 07/20/2023, 12:31 PM

## 2023-07-20 NOTE — Evaluation (Signed)
 Speech Language Pathology Assessment and Plan  Patient Details  Name: [name]. MRN: [id number] Date of Birth: Dec 01, 1950  SLP Diagnosis: Cognitive Impairments  Rehab Potential: Good ELOS: 10-12   Today's Date: 07/20/2023 SLP Individual Time: 5784-6962 SLP Individual Time Calculation (min): 54 min  Hospital Problem: Principal Problem:   Acute ischemic right MCA stroke (HCC)  Past Medical History:  Past Medical History:  Diagnosis Date   Anemia    Cervical myelopathy (HCC)    Chronic venous insufficiency    [name] (chronic kidney disease), stage III (HCC)    Diabetes mellitus without complication (HCC)    GERD (gastroesophageal reflux disease)    Hypercholesteremia    Hypothyroidism    Kidney stones    about 50 in the past--last in March 4/24   Leg weakness, bilateral    due to Myasthenia Gravis   Myasthenia gravis (HCC)    Spinal stenosis    Vitamin B 12 deficiency    Vitamin B 12 deficiency    Wears hearing aid    bilateral   Past Surgical History:  Past Surgical History:  Procedure Laterality Date   BACK SURGERY  09/2015   CARDIAC CATHETERIZATION     CATARACT EXTRACTION W/ INTRAOCULAR LENS IMPLANT     CATARACT EXTRACTION W/PHACO Right 12/21/2015   Procedure: CATARACT EXTRACTION PHACO AND INTRAOCULAR LENS PLACEMENT (IOC);  Surgeon: Annell Kidney, MD;  Location: [facility];  Service: Ophthalmology;  Laterality: Right;  DIABETIC - insulin  and oral meds   COLONOSCOPY     SHOULDER SURGERY     labrum repair   TONSILLECTOMY     Assessment / Plan / Recommendation Clinical Impression   Douglas Wrightis a 73 year old RH male with history of T2DM, HTN, [name], renal calculi, [name] who was admitted to [facility] on 07/14/23 with 3-4 day history of stuttering and fluctuating LUE weakness with fall. He also reported some double vision. He was found to have left facial weakness with LUE weakness and mild to moderate dysarthria. MRI  brain done revealing acute R-MCA infarct involving right periventricular white matter and extending into right frontal and parietal lobes. MRA showed focal occlusion of right inferior division of distal M2 MCA. He did sustain a fall and struck his head on 04/23 and CT head showed expansion of territory involving R-MCA infarct with increasing local mass effect and new leftward midline shift of 3 mm.  He was independent with use of cane and working PTA.  Therapy was working with patient who requires min to mod assist with ADLs and mobility. CIR recommended due to functional decline.   Cognitive-Linguistic Evaluation: Patient presents with cognitive deficits in the areas of problem solving and left inattention as evidenced throughout patient/family interview and performance on Cognistat evaluation. Patient further demonstrates impairments in the areas of intellectual, anticipatory, and emergent awareness of deficits/mistakes. Wife present at bedside. While patient endorses no changes to cognitive skills, wife reports that she notes intermittent confusion, especially in the AM, as well as changes to delayed memory accuracy. Patient is oriented x4 independently and also demonstrates relative strengths in the area of immediate recall/registration and sustained attention. While deficits are mild, patient manages several complex banking accounts, thus would benefit from targeting higher-level problem solving to ensure he would be accurate while managing these accounts. Patient would benefit from SLP services to target aforementioned deficits.   Bedside Swallowing Evaluation: Patient presents with oropharyngeal swallowing function that is WNL. Patient endorses at baseline  he utilizes head turn strategy to the right to compensate for swallow inefficiency that arose from prior neck surgery, however this has not changed compared to PTA. SLP assessed tolerance of regular/thin liquids. Patient consumed crackers and tea  without overt s/sx of penetration/aspiration. Likewise no difficulty reported nor observed. No further ST swallowing needs.   Patient was left in lowered bed with call bell in reach and bed alarm set. SLP will continue to target goals per plan of care.      Skilled Therapeutic Interventions          SLP conducted assessment to evaluate cognitive-linguistic and swallowing function. Utilized bedside swallow evaluation, Cognistat, and patient interview. Full results above.    SLP Assessment  Patient will need skilled Speech Lanaguage Pathology Services during CIR admission    Recommendations  SLP Diet Recommendations: Age appropriate regular solids;Thin Liquid Administration via: Cup;Straw Medication Administration: Whole meds with liquid Supervision: Patient able to self feed Postural Changes and/or Swallow Maneuvers: Head tilt right during swallow Oral Care Recommendations: Oral care BID Patient destination: Home Follow up Recommendations: None Equipment Recommended: None recommended by SLP    SLP Frequency 1 to 3 out of 7 days   SLP Duration  SLP Intensity  SLP Treatment/Interventions 10-12  Minumum of 1-2 x/day, 30 to 90 minutes  Cueing hierarchy;Cognitive remediation/compensation;Internal/external aids;Therapeutic Activities;Functional tasks;Patient/family education    Pain Pain Assessment Pain Scale: 0-10 Pain Score: 0-No pain  Prior Functioning Cognitive/Linguistic Baseline: Within functional limits Type of Home: House  Lives With: Spouse Available Help at Discharge: Family;Available 24 hours/day Vocation: Part time employment  SLP Evaluation Cognition Overall Cognitive Status: Impaired/Different from baseline Arousal/Alertness: Awake/alert Orientation Level: Oriented to person;Oriented to place;Oriented to situation;Oriented to time Year: 2025 Month: April Day of Week: Correct Attention: Sustained;Selective Sustained Attention: Appears intact Selective  Attention: Appears intact Memory: Impaired Memory Impairment: Decreased recall of new information Awareness: Impaired Awareness Impairment: Intellectual impairment;Emergent impairment;Anticipatory impairment Problem Solving: Impaired Problem Solving Impairment: Functional complex Executive Function: Reasoning;Sequencing;Organizing Reasoning: Appears intact Sequencing: Appears intact Organizing: Appears intact Safety/Judgment: Appears intact  Comprehension Auditory Comprehension Overall Auditory Comprehension: Appears within functional limits for tasks assessed Expression Expression Primary Mode of Expression: Verbal Verbal Expression Overall Verbal Expression: Appears within functional limits for tasks assessed Written Expression Dominant Hand: Right Oral Motor Oral Motor/Sensory Function Overall Oral Motor/Sensory Function: Within functional limits Motor Speech Overall Motor Speech: Appears within functional limits for tasks assessed Intelligibility: Intelligible  Care Tool Care Tool Cognition Ability to hear (with hearing aid or hearing appliances if normally used Ability to hear (with hearing aid or hearing appliances if normally used): 0. Adequate - no difficulty in normal conservation, social interaction, listening to TV   Expression of Ideas and Wants Expression of Ideas and Wants: 4. Without difficulty (complex and basic) - expresses complex messages without difficulty and with speech that is clear and easy to understand   Understanding Verbal and Non-Verbal Content Understanding Verbal and Non-Verbal Content: 4. Understands (complex and basic) - clear comprehension without cues or repetitions  Memory/Recall Ability Memory/Recall Ability : Current season;That he or she is in a hospital/hospital unit   Intelligibility: Intelligible  Short Term Goals: Week 1: SLP Short Term Goal 1 (Week 1): Patient will solve complex financial problems with 90% accuracy with modI. SLP  Short Term Goal 2 (Week 1): Patient will demonstrate recall of pertinent daily complex information with 90% accuracy with modI.  Refer to Care Plan for Long Term Goals  Recommendations for other services:  None   Discharge Criteria: Patient will be discharged from SLP if patient refuses treatment 3 consecutive times without medical reason, if treatment goals not met, if there is a change in medical status, if patient makes no progress towards goals or if patient is discharged from hospital.  The above assessment, treatment plan, treatment alternatives and goals were discussed and mutually agreed upon: by patient  Maclean Foister, M.A., CCC-SLP  Nefertari Rebman A Shekira Drummer 07/20/2023, 12:17 PM

## 2023-07-20 NOTE — Plan of Care (Signed)
  Problem: RH Problem Solving Goal: LTG Patient will demonstrate problem solving for (SLP) Description: LTG:  Patient will demonstrate problem solving for basic/complex daily situations with cues  (SLP) Flowsheets (Taken 07/20/2023 1159) LTG: Patient will demonstrate problem solving for (SLP): Complex daily situations LTG Patient will demonstrate problem solving for: Modified Independent   Problem: RH Memory Goal: LTG Patient will demonstrate ability for day to day (SLP) Description: LTG:   Patient will demonstrate ability for day to day recall/carryover during cognitive/linguistic activities with assist  (SLP) Flowsheets (Taken 07/20/2023 1159) LTG: Patient will demonstrate ability for day to day recall: Daily complex information LTG: Patient will demonstrate ability for day to day recall/carryover during cognitive/linguistic activities with assist (SLP): Modified Independent

## 2023-07-20 NOTE — Plan of Care (Signed)
 Problem: RH Balance Goal: LTG: Patient will maintain dynamic sitting balance (OT) Description: LTG:  Patient will maintain dynamic sitting balance with assistance during activities of daily living (OT) Flowsheets (Taken 07/20/2023 1242) LTG: Pt will maintain dynamic sitting balance during ADLs with: Supervision/Verbal cueing Goal: LTG Patient will maintain dynamic standing with ADLs (OT) Description: LTG:  Patient will maintain dynamic standing balance with assist during activities of daily living (OT)  Flowsheets (Taken 07/20/2023 1242) LTG: Pt will maintain dynamic standing balance during ADLs with: Contact Guard/Touching assist   Problem: Sit to Stand Goal: LTG:  Patient will perform sit to stand in prep for activites of daily living with assistance level (OT) Description: LTG:  Patient will perform sit to stand in prep for activites of daily living with assistance level (OT) Flowsheets (Taken 07/20/2023 1242) LTG: PT will perform sit to stand in prep for activites of daily living with assistance level: Contact Guard/Touching assist   Problem: RH Eating Goal: LTG Patient will perform eating w/assist, cues/equip (OT) Description: LTG: Patient will perform eating with assist, with/without cues using equipment (OT) Flowsheets (Taken 07/20/2023 1242) LTG: Pt will perform eating with assistance level of: Independent with assistive device    Problem: RH Grooming Goal: LTG Patient will perform grooming w/assist,cues/equip (OT) Description: LTG: Patient will perform grooming with assist, with/without cues using equipment (OT) Flowsheets (Taken 07/20/2023 1242) LTG: Pt will perform grooming with assistance level of: Supervision/Verbal cueing   Problem: RH Bathing Goal: LTG Patient will bathe all body parts with assist levels (OT) Description: LTG: Patient will bathe all body parts with assist levels (OT) Flowsheets (Taken 07/20/2023 1242) LTG: Pt will perform bathing with assistance  level/cueing: Minimal Assistance - Patient > 75%   Problem: RH Dressing Goal: LTG Patient will perform upper body dressing (OT) Description: LTG Patient will perform upper body dressing with assist, with/without cues (OT). Flowsheets (Taken 07/20/2023 1242) LTG: Pt will perform upper body dressing with assistance level of: Supervision/Verbal cueing Goal: LTG Patient will perform lower body dressing w/assist (OT) Description: LTG: Patient will perform lower body dressing with assist, with/without cues in positioning using equipment (OT) Flowsheets (Taken 07/20/2023 1242) LTG: Pt will perform lower body dressing with assistance level of: Minimal Assistance - Patient > 75%   Problem: RH Toileting Goal: LTG Patient will perform toileting task (3/3 steps) with assistance level (OT) Description: LTG: Patient will perform toileting task (3/3 steps) with assistance level (OT)  Flowsheets (Taken 07/20/2023 1242) LTG: Pt will perform toileting task (3/3 steps) with assistance level: Minimal Assistance - Patient > 75%   Problem: RH Vision Goal: RH LTG Vision Consulting civil engineer) Flowsheets (Taken 07/20/2023 1242) LTG: Vision Goals: Pt will demonstrate improvement in visual awareness to consistently attend to L side during ADL with no more than occasional verbal cues.   Problem: RH Functional Use of Upper Extremity Goal: LTG Patient will use RT/LT upper extremity as a (OT) Description: LTG: Patient will use right/left upper extremity as a stabilizer/gross assist/diminished/nondominant/dominant level with assist, with/without cues during functional activity (OT) Flowsheets (Taken 07/20/2023 1242) LTG: Use of upper extremity in functional activities: LUE as gross assist level LTG: Pt will use upper extremity in functional activity with assistance level of: Supervision/Verbal cueing   Problem: RH Toilet Transfers Goal: LTG Patient will perform toilet transfers w/assist (OT) Description: LTG: Patient will perform  toilet transfers with assist, with/without cues using equipment (OT) Flowsheets (Taken 07/20/2023 1242) LTG: Pt will perform toilet transfers with assistance level of: Contact Guard/Touching assist  Problem: RH Tub/Shower Transfers Goal: LTG Patient will perform tub/shower transfers w/assist (OT) Description: LTG: Patient will perform tub/shower transfers with assist, with/without cues using equipment (OT) Flowsheets (Taken 07/20/2023 1242) LTG: Pt will perform tub/shower stall transfers with assistance level of: Contact Guard/Touching assist

## 2023-07-20 NOTE — Plan of Care (Signed)
  Problem: RH Balance Goal: LTG Patient will maintain dynamic sitting balance (PT) Description: LTG:  Patient will maintain dynamic sitting balance with assistance during mobility activities (PT) Flowsheets (Taken 07/20/2023 1544) LTG: Pt will maintain dynamic sitting balance during mobility activities with:: Supervision/Verbal cueing Goal: LTG Patient will maintain dynamic standing balance (PT) Description: LTG:  Patient will maintain dynamic standing balance with assistance during mobility activities (PT) Flowsheets (Taken 07/20/2023 1544) LTG: Pt will maintain dynamic standing balance during mobility activities with:: Contact Guard/Touching assist   Problem: Sit to Stand Goal: LTG:  Patient will perform sit to stand with assistance level (PT) Description: LTG:  Patient will perform sit to stand with assistance level (PT) Flowsheets (Taken 07/20/2023 1544) LTG: PT will perform sit to stand in preparation for functional mobility with assistance level: Contact Guard/Touching assist   Problem: RH Bed Mobility Goal: LTG Patient will perform bed mobility with assist (PT) Description: LTG: Patient will perform bed mobility with assistance, with/without cues (PT). Flowsheets (Taken 07/20/2023 1544) LTG: Pt will perform bed mobility with assistance level of: Supervision/Verbal cueing   Problem: RH Bed to Chair Transfers Goal: LTG Patient will perform bed/chair transfers w/assist (PT) Description: LTG: Patient will perform bed to chair transfers with assistance (PT). Flowsheets (Taken 07/20/2023 1544) LTG: Pt will perform Bed to Chair Transfers with assistance level: Contact Guard/Touching assist   Problem: RH Car Transfers Goal: LTG Patient will perform car transfers with assist (PT) Description: LTG: Patient will perform car transfers with assistance (PT). Flowsheets (Taken 07/20/2023 1544) LTG: Pt will perform car transfers with assist:: Minimal Assistance - Patient > 75%   Problem: RH  Ambulation Goal: LTG Patient will ambulate in controlled environment (PT) Description: LTG: Patient will ambulate in a controlled environment, # of feet with assistance (PT). Flowsheets (Taken 07/20/2023 1544) LTG: Pt will ambulate in controlled environ  assist needed:: Minimal Assistance - Patient > 75% LTG: Ambulation distance in controlled environment: 150' Goal: LTG Patient will ambulate in home environment (PT) Description: LTG: Patient will ambulate in home environment, # of feet with assistance (PT). Flowsheets (Taken 07/20/2023 1544) LTG: Pt will ambulate in home environ  assist needed:: Minimal Assistance - Patient > 75% LTG: Ambulation distance in home environment: 57'   Problem: RH Wheelchair Mobility Goal: LTG Patient will propel w/c in controlled environment (PT) Description: LTG: Patient will propel wheelchair in controlled environment, # of feet with assist (PT) Flowsheets (Taken 07/20/2023 1544) LTG: Pt will propel w/c in controlled environ  assist needed:: Independent with assistive device LTG: Propel w/c distance in controlled environment: 150' Goal: LTG Patient will propel w/c in home environment (PT) Description: LTG: Patient will propel wheelchair in home environment, # of feet with assistance (PT). Flowsheets (Taken 07/20/2023 1544) LTG: Pt will propel w/c in home environ  assist needed:: Independent with assistive device LTG: Propel w/c distance in home environment: 50'   Problem: RH Stairs Goal: LTG Patient will ambulate up and down stairs w/assist (PT) Description: LTG: Patient will ambulate up and down # of stairs with assistance (PT) Flowsheets (Taken 07/20/2023 1544) LTG: Pt will ambulate up/down stairs assist needed:: Minimal Assistance - Patient > 75% LTG: Pt will  ambulate up and down number of stairs: 3 with RHR per home set up

## 2023-07-20 NOTE — Evaluation (Signed)
 Physical Therapy Assessment and Plan  Patient Details  Name: [name]. MRN: [id number] Date of Birth: [birth year]  PT Diagnosis: Abnormality of gait, Difficulty walking, Hemiparesis non-dominant, Hypertonia, Impaired sensation, and Muscle weakness Rehab Potential: Good ELOS: 2-3 weeks   Today's Date: 07/20/2023 PT Individual Time: 2130-8657 PT Individual Time Calculation (min): 75 min    Hospital Problem: Principal Problem:   Acute ischemic right MCA stroke (HCC)   Past Medical History:  Past Medical History:  Diagnosis Date   Anemia    Cervical myelopathy (HCC)    Chronic venous insufficiency    [name] (chronic kidney disease), stage III (HCC)    Diabetes mellitus without complication (HCC)    GERD (gastroesophageal reflux disease)    Hypercholesteremia    Hypothyroidism    Kidney stones    about 50 in the past--last in March 4/24   Leg weakness, bilateral    due to Myasthenia Gravis   Myasthenia gravis (HCC)    Spinal stenosis    Vitamin B 12 deficiency    Vitamin B 12 deficiency    Wears hearing aid    bilateral   Past Surgical History:  Past Surgical History:  Procedure Laterality Date   BACK SURGERY  09/2015   CARDIAC CATHETERIZATION     CATARACT EXTRACTION W/ INTRAOCULAR LENS IMPLANT     CATARACT EXTRACTION W/PHACO Right 12/21/2015   Procedure: CATARACT EXTRACTION PHACO AND INTRAOCULAR LENS PLACEMENT (IOC);  Surgeon: Annell Kidney, MD;  Location: [facility];  Service: Ophthalmology;  Laterality: Right;  DIABETIC - insulin  and oral meds   COLONOSCOPY     SHOULDER SURGERY     labrum repair   TONSILLECTOMY      Assessment & Plan Clinical Impression: Patient is a 73 year old RH male with history of T2DM, HTN, [name], renal calculi, [name] who was admitted to [facility] on 07/14/23 with 3-4 day history of stuttering speech difficulty and fluctuating LUE weakness with fall. He also reported some double vision and having talked  with his neurologist earlier in the week who recommended increasing prednisone  from 2 mg to 10 mg due to concerns of MG flare. He was found to have left facial weakness with LUE weakness and mild to moderate dysarthria. 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 MRA showed focal occlusion of right inferior division of distal M2 MCA. Carotid ultrasound was negative for significant ICA stenosis. 2D echo with bubble study showed EF > 55% with normal RVF and mild AR and trivial TR and normal microcavitation study but with poor sound transmission. BLE dopplers were negative for DVT but limited visualization of posterior tibial and peroneal veins reported.    He did sustain a fall and struck his head on 04/23 and CT head showed expansion of territory involving R-MCA infarct with increasing local mass effect and new leftward midline shift of 3 mm. Follow up CT head 04/24 showed no change in infarct, edema or mass effect ad no bleed. Stroke felt to be cryptogenic in nature and 30 day event monitor recommended. He was maintained on low dose ASA and prednisone  weaned back to 2 mg daily. He has had issues with insomnia,  back pain as well as constipation.  He was independent with use of cane? and working PTA.  Therapy was working with patient who requires min to mod assist with ADLs  and mobility. CIR recommended due to functional decline.   Patient currently requires max with mobility secondary to muscle weakness, decreased cardiorespiratoy endurance, impaired timing and sequencing, abnormal tone, unbalanced muscle activation, decreased coordination, and decreased motor planning, decreased midline orientation and decreased attention to left, decreased awareness and delayed processing, and decreased sitting balance, decreased standing balance, decreased postural control,  hemiplegia, and decreased balance strategies.  Prior to hospitalization, patient was independent  with mobility and lived with Spouse in a House home.  Home access is 3Stairs to enter.  Patient will benefit from skilled PT intervention to maximize safe functional mobility, minimize fall risk, and decrease caregiver burden for planned discharge home with 24 hour assist.  Anticipate patient will benefit from follow up [facility] at discharge.  PT - End of Session Activity Tolerance: Tolerates 30+ min activity without fatigue Endurance Deficit: Yes Endurance Deficit Description: rest breaks with mobility PT Assessment Rehab Potential (ACUTE/IP ONLY): Good PT Barriers to Discharge: Inaccessible home environment;Decreased caregiver support;Home environment access/layout PT Barriers to Discharge Comments: requires mod/max assist for mobility at this time, wife would be unable to assist at this time, 3 STE with RHR PT Patient demonstrates impairments in the following area(s): Balance;Endurance;Edema;Motor;Perception;Safety;Sensory PT Transfers Functional Problem(s): Bed to Chair;Car;Bed Mobility PT Locomotion Functional Problem(s): Ambulation;Stairs PT Plan PT Intensity: Minimum of 1-2 x/day ,45 to 90 minutes PT Frequency: 5 out of 7 days PT Duration Estimated Length of Stay: 2-3 weeks PT Treatment/Interventions: Ambulation/gait training;Discharge planning;Functional mobility training;Psychosocial support;Therapeutic Activities;Visual/perceptual remediation/compensation;Balance/vestibular training;Disease management/prevention;Neuromuscular re-education;Therapeutic Exercise;Wheelchair propulsion/positioning;Cognitive remediation/compensation;DME/adaptive equipment instruction;Pain management;Splinting/orthotics;UE/LE Strength taining/ROM;Community reintegration;Functional electrical stimulation;Patient/family education;Stair training;UE/LE Coordination activities PT Transfers Anticipated Outcome(s): CGA PT  Locomotion Anticipated Outcome(s): min assist gait, modI WC PT Recommendation Recommendations for Other Services: None Follow Up Recommendations: Home health PT Patient destination: Home Equipment Recommended: To be determined   PT Evaluation Precautions/Restrictions Precautions Precautions: Fall Precaution/Restrictions Comments: falls, L hemi, mild L sided inattention with L lateral lean Restrictions Weight Bearing Restrictions Per Provider Order: No Pain Interference Pain Interference Pain Effect on Sleep: 1. Rarely or not at all Pain Interference with Therapy Activities: 1. Rarely or not at all Pain Interference with Day-to-Day Activities: 1. Rarely or not at all Home Living/Prior Functioning Home Living Available Help at Discharge: Family;Available 24 hours/day Type of Home: House Home Access: Stairs to enter [employer] of Steps: 3 Entrance Stairs-Rails: Right Home Layout: Two level;Able to live on main level with bedroom/bathroom Alternate Level Stairs-Number of Steps: flight Bathroom Shower/Tub: Health visitor: Handicapped height Bathroom Accessibility: Yes  Lives With: Spouse Prior Function Level of Independence: Independent with basic ADLs;Independent with transfers;Independent with gait;Requires assistive device for independence  Able to Take Stairs?: Yes Driving: Yes Vocation: Part time employment 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 Cognition Overall Cognitive Status: Impaired/Different from baseline Arousal/Alertness: Awake/alert Orientation Level: Oriented to person;Oriented to place;Oriented to situation;Oriented to time Year: 2025 Month: April Day of Week: Correct Attention: Sustained;Selective Sustained Attention: Appears intact Selective Attention: Appears intact Memory: Impaired Memory Impairment: Decreased recall of new information Awareness: Impaired Awareness Impairment: Intellectual impairment;Emergent  impairment;Anticipatory impairment Problem Solving: Impaired Problem Solving Impairment: Functional complex Executive Function: Reasoning;Sequencing;Organizing Reasoning: Appears intact Sequencing: Appears intact Organizing: Appears intact Safety/Judgment: Appears intact Sensation Sensation Light Touch: Impaired by gross assessment Hot/Cold: Impaired by gross assessment Proprioception: Impaired by gross assessment Additional Comments: decreased sensation LUE/LLE with light touch and temperature Coordination Gross Motor Movements are Fluid and Coordinated: No Fine Motor Movements are Fluid and Coordinated: No Coordination and Movement  Description: L hemi limiting Motor  Motor Motor: Hemiplegia Motor - Skilled Clinical Observations: L hemi with LUE>LLE, mild L inattention with L lateropulsion  Trunk/Postural Assessment  Cervical Assessment Cervical Assessment: Within Functional Limits Thoracic Assessment Thoracic Assessment: Exceptions to [facility] (rounded shoulders) Lumbar Assessment Lumbar Assessment: Exceptions to [facility] (posterior pelvic tilt) Postural Control Postural Control: Deficits on evaluation Trunk Control: L lateral lean in sitting and standing Righting Reactions: minimal to absent Protective Responses: minimal Postural Limitations: decreased  Balance Balance Balance Assessed: Yes Static Sitting Balance Static Sitting - Balance Support: Feet supported;Bilateral upper extremity supported Static Sitting - Level of Assistance: 4: Min assist Dynamic Sitting Balance Dynamic Sitting - Balance Support: During functional activity;Right upper extremity supported Dynamic Sitting - Level of Assistance: 3: Mod assist Static Standing Balance Static Standing - Balance Support: During functional activity;Bilateral upper extremity supported Static Standing - Level of Assistance: 2: Max assist Dynamic Standing Balance Dynamic Standing - Balance Support: During functional activity;No  upper extremity supported Dynamic Standing - Level of Assistance: 2: Max assist Extremity Assessment  RLE Assessment RLE Assessment: Within Functional Limits General Strength Comments: grossly 5/5 LLE Assessment LLE Assessment: Exceptions to [facility] General Strength Comments: tested  in sitting LLE Strength Left Hip Flexion: 2+/5 Left Hip Extension: 3/5 Left Knee Flexion: 3-/5 Left Knee Extension: 3+/5 Left Ankle Dorsiflexion: 2+/5 Left Ankle Plantar Flexion: 2+/5  Care Tool Care Tool Bed Mobility Roll left and right activity   Roll left and right assist level: Maximal Assistance - Patient 25 - 49%    Sit to lying activity   Sit to lying assist level: Maximal Assistance - Patient 25 - 49%    Lying to sitting on side of bed activity   Lying to sitting on side of bed assist level: the ability to move from lying on the back to sitting on the side of the bed with no back support.: Maximal Assistance - Patient 25 - 49%     Care Tool Transfers Sit to stand transfer   Sit to stand assist level: Maximal Assistance - Patient 25 - 49%    Chair/bed transfer   Chair/bed transfer assist level: Dependent - mechanical lift (stedy)    Car transfer Car transfer activity did not occur: Safety/medical concerns        Care Tool Locomotion Ambulation Ambulation activity did not occur: Safety/medical concerns (ambulated 25' with RHR in hallway mod assist and +2 WC follow)        Walk 10 feet activity Walk 10 feet activity did not occur: Safety/medical concerns       Walk 50 feet with 2 turns activity Walk 50 feet with 2 turns activity did not occur: Safety/medical concerns      Walk 150 feet activity Walk 150 feet activity did not occur: Safety/medical concerns      Walk 10 feet on uneven surfaces activity Walk 10 feet on uneven surfaces activity did not occur: Safety/medical concerns      Stairs Stair activity did not occur: Safety/medical concerns        Walk up/down 1 step  activity Walk up/down 1 step or curb (drop down) activity did not occur: Safety/medical concerns      Walk up/down 4 steps activity Walk up/down 4 steps activity did not occur: Safety/medical concerns      Walk up/down 12 steps activity Walk up/down 12 steps activity did not occur: Safety/medical concerns      Pick up small objects from floor Pick up small object from  the floor (from standing position) activity did not occur: Safety/medical concerns      Wheelchair Is the patient using a wheelchair?: Yes Type of Wheelchair: Manual   Wheelchair assist level: Dependent - Patient 0%    Wheel 50 feet with 2 turns activity   Assist Level: Dependent - Patient 0%  Wheel 150 feet activity   Assist Level: Dependent - Patient 0%    Refer to Care Plan for Long Term Goals  SHORT TERM GOAL WEEK 1 PT Short Term Goal 1 (Week 1): Pt will complete bed mobility mod assist PT Short Term Goal 2 (Week 1): Pt will complete sit<>stand transfers with LRAD with min assist PT Short Term Goal 3 (Week 1): Pt will complete bed<>chair transfers with LRAD with mod assist PT Short Term Goal 4 (Week 1): Pt will ambulate with LRAD 58' with mod assist  Recommendations for other services: None   Skilled Therapeutic Intervention Evaluation completed (see details above and below) with education on PT POC and goals and individual treatment initiated with focus on therapeutic activities for transfers, upright tolerance, education on tone and tone management, gait training with cues for gait mechanics and NMR for midline orientation and seated/standing balance. Pt completes transfers via stedy with min/mod assist to stand with cues for LUE positioning as pt demonstrating mild L sided inattention, slide L lateral lean with standing in stedy however able to maintain balance without assist. Pt completes stand pivot transfer with max assist without device mod cues for BLE foot placement and manual facilitation for  weightshifting. Pt completes gait with RHR in hallway ~25' with mod assist for midline orientation as pt demonstrating excessive anterior and L lateral lean, noted scissoring gait pt able to correct with cues, L genu recurvatum in stance. Pt initiates gait training with eva walker with min assist to stand to eva walker, gait 5' with mod assist and cues for foot placement. Pt completes NMR seated EOM for midline orientation in sitting and standing. Pt returns to room and completes stedy transfer back to recliner where he remains with all needs within reach, cal light in place and chair alarm donned and activated at end of session.    Mobility Transfers Transfers: Sit to Stand;Stand to Sit Sit to Stand: Maximal Assistance - Patient 25-49% Stand to Sit: Maximal Assistance - Patient 25-49% Transfer (Assistive device): None Locomotion  Gait Ambulation: Yes Gait Assistance: 2 Helpers Gait Distance (Feet): 25 Feet Assistive device: Other (Comment) (handrail on R) Gait Assistance Details: Verbal cues for sequencing;Verbal cues for technique;Verbal cues for gait pattern;Manual facilitation for weight shifting Gait Gait: Yes Gait Pattern: Impaired Gait Pattern: Ataxic;Narrow base of support;Scissoring;Left genu recurvatum;Decreased stance time - left;Trunk flexed;Lateral trunk lean to left Gait velocity: decreased Stairs / Additional Locomotion Stairs: No Wheelchair Mobility Wheelchair Mobility: No   Discharge Criteria: Patient will be discharged from PT if patient refuses treatment 3 consecutive times without medical reason, if treatment goals not met, if there is a change in medical status, if patient makes no progress towards goals or if patient is discharged from hospital.  The above assessment, treatment plan, treatment alternatives and goals were discussed and mutually agreed upon: by patient  Annia Kilts PT, DPT 07/20/2023, 3:37 PM

## 2023-07-21 DIAGNOSIS — G7000 Myasthenia gravis without (acute) exacerbation: Secondary | ICD-10-CM | POA: Diagnosis not present

## 2023-07-21 DIAGNOSIS — I639 Cerebral infarction, unspecified: Secondary | ICD-10-CM | POA: Diagnosis not present

## 2023-07-21 DIAGNOSIS — I63511 Cerebral infarction due to unspecified occlusion or stenosis of right middle cerebral artery: Secondary | ICD-10-CM | POA: Diagnosis not present

## 2023-07-21 DIAGNOSIS — E1169 Type 2 diabetes mellitus with other specified complication: Secondary | ICD-10-CM | POA: Diagnosis not present

## 2023-07-21 LAB — GLUCOSE, CAPILLARY
Glucose-Capillary: 167 mg/dL — ABNORMAL HIGH (ref 70–99)
Glucose-Capillary: 196 mg/dL — ABNORMAL HIGH (ref 70–99)
Glucose-Capillary: 185 mg/dL — ABNORMAL HIGH (ref 70–99)
Glucose-Capillary: 229 mg/dL — ABNORMAL HIGH (ref 70–99)

## 2023-07-21 NOTE — Progress Notes (Signed)
 PROGRESS NOTE   Subjective/Complaints:  No issues overnite  Some back pain with movement   ROS- neg CP, SOB, N/V/D  Objective:   No results found. Recent Labs    07/20/23 0226  WBC 8.5  HGB 11.4*  HCT 34.8*  PLT 312   Recent Labs    07/20/23 0226  NA 138  K 3.8  CL 104  CO2 23  GLUCOSE 173*  BUN 16  CREATININE 1.22  CALCIUM  8.5*    Intake/Output Summary (Last 24 hours) at 07/21/2023 0854 Last data filed at 07/21/2023 0318 Gross per 24 hour  Intake 177 ml  Output 420 ml  Net -243 ml        Physical Exam: Vital Signs Blood pressure 123/73, pulse 62, temperature 98.2 F (36.8 C), temperature source Oral, resp. rate 18, SpO2 97%.   General: No acute distress Mood and affect are appropriate Heart: Regular rate and rhythm no rubs murmurs or extra sounds Lungs: Clear to auscultation, breathing unlabored, no rales or wheezes Abdomen: Positive bowel sounds, soft nontender to palpation, nondistended Extremities: No clubbing, cyanosis, or edema Skin: No evidence of breakdown, no evidence of rash Neurologic: Cranial nerves II through XII intact, motor strength is 5/5 in right and 2-/5 left deltoid, bicep, tricep, grip, hip flexor, knee extensors, ankle dorsiflexor and plantar flexor Sensory exam normal sensation to light touch  in bilateral upper and lower extremities Cerebellar exam limited by weakness on the left side  Musculoskeletal: Full passive  range of motion in all 4 extremities. No joint swelling   Assessment/Plan: 1. Functional deficits which require 3+ hours per day of interdisciplinary therapy in a comprehensive inpatient rehab setting. Physiatrist is providing close team supervision and 24 hour management of active medical problems listed below. Physiatrist and rehab team continue to assess barriers to discharge/monitor patient progress toward functional and medical goals  Care  Tool:  Bathing    Body parts bathed by patient: Left arm, Chest, Abdomen, Right upper leg, Left upper leg, Face   Body parts bathed by helper: Right arm, Front perineal area, Buttocks, Right lower leg, Left lower leg     Bathing assist Assist Level: Maximal Assistance - Patient 24 - 49%     Upper Body Dressing/Undressing Upper body dressing   What is the patient wearing?: Pull over shirt    Upper body assist Assist Level: Maximal Assistance - Patient 25 - 49%    Lower Body Dressing/Undressing Lower body dressing      What is the patient wearing?: Pants     Lower body assist Assist for lower body dressing: Dependent - Patient 0%     Toileting Toileting    Toileting assist Assist for toileting: Total Assistance - Patient < 25%     Transfers Chair/bed transfer  Transfers assist     Chair/bed transfer assist level: Dependent - mechanical lift (stedy)     Locomotion Ambulation   Ambulation assist   Ambulation activity did not occur: Safety/medical concerns (ambulated 79' with RHR in hallway mod assist and +2 WC follow)          Walk 10 feet activity   Assist  Walk 10 feet activity  did not occur: Safety/medical concerns        Walk 50 feet activity   Assist Walk 50 feet with 2 turns activity did not occur: Safety/medical concerns         Walk 150 feet activity   Assist Walk 150 feet activity did not occur: Safety/medical concerns         Walk 10 feet on uneven surface  activity   Assist Walk 10 feet on uneven surfaces activity did not occur: Safety/medical concerns         Wheelchair     Assist Is the patient using a wheelchair?: Yes Type of Wheelchair: Manual    Wheelchair assist level: Dependent - Patient 0%      Wheelchair 50 feet with 2 turns activity    Assist        Assist Level: Dependent - Patient 0%   Wheelchair 150 feet activity     Assist      Assist Level: Dependent - Patient 0%    Blood pressure 123/73, pulse 62, temperature 98.2 F (36.8 C), temperature source Oral, resp. rate 18, SpO2 97%.  Medical Problem List and Plan: 1. 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  Antithrombotics: -DVT/anticoagulation:  Pharmaceutical: Lovenox              -antiplatelet therapy: ASA 3. Pain Management: Tylenol prn.  4. Mood/Behavior/Sleep: LCSW to follow for evaluation and support.              -antipsychotic agents: N/A 5. Neuropsych/cognition: This patient is capable of making decisions on his own behalf. 6. Skin/Wound Care: LCSW to follow for evaluation and support             --Gerhardt's butt cream to MASD on buttocks             --Diflucan for candida cruris.              --pressure relief measures to left elbow/left heel.  7. Fluids/Electrolytes/Nutrition:  Monitor I/O. Check CMET in am.     Latest Ref Rng & Units 07/20/2023    2:26 AM 01/02/2016    3:51 PM  BMP  Glucose 70 - 99 mg/dL 161  096   BUN 8 - 23 mg/dL 16  34   Creatinine 0.45 - 1.24 mg/dL 4.09  8.11   Sodium 914 - 145 mmol/L 138  134   Potassium 3.5 - 5.1 mmol/L 3.8  5.5   Chloride 98 - 111 mmol/L 104  108   CO2 22 - 32 mmol/L 23  18   Calcium  8.9 - 10.3 mg/dL 8.5  9.1    Renal fxn corrected  8. T2DM: Hgb A1C- 8.2 (PTA) Was taking Tresiba 54 units in am ( now on Semglee  22U ) and  Metformin 1000 mg--bid (restart Metformin at 500mg  BID)   CBG (last 3)  Recent Labs    07/20/23 1604 07/20/23 2100 07/21/23 0616  GLUCAP 268* 219* 167*   Increase Semglee  to 25 U , am CBG improved monitor daytime CBG with med changes 9.   HTN: Monitor BP TID--losartan 50 mg daily--was held for a  few days to prevent hypoperfusion             --will set parameters to hold prn sitting SBP< 120.  Vitals:    07/20/23 2005 07/21/23 0535  BP: 129/84 123/73  Pulse: 66 62  Resp: 18 18  Temp: 98.8 F (37.1 C) 98.2 F (36.8 C)  SpO2: 97% 97%    10.  Myasthenia Gravis Mycophenolate  1500/1250mg  --Dr. Franklin Ito at [facility].              Prednisone  5 mg PTA--->has been weaned down to 2 mg daily, monitor for Ptosis and generalized proximal weakness 11. Hypothyroid: Stable on levothyroxine for supplement.  12.  Dyslipidemia: Was on pravastatin 80 mg daily 13. H/o Kidney stones: On Sodium bicarb BID to prevent stones.  14. Constipation: On miralax but no BM for 5 days. Willing to take something stronger today             --sorbitol added.  15. H/o GERD: Has been off PPI.   LOS: 2 days A FACE TO FACE EVALUATION WAS PERFORMED  Genetta Kenning 07/21/2023, 8:54 AM

## 2023-07-21 NOTE — Plan of Care (Signed)
?  Problem: RH BOWEL ELIMINATION ?Goal: RH STG MANAGE BOWEL WITH ASSISTANCE ?Description: STG Manage Bowel with mod I Assistance. ?Outcome: Progressing ?Goal: RH STG MANAGE BOWEL W/MEDICATION W/ASSISTANCE ?Description: STG Manage Bowel with Medication with mod I  Assistance. ?Outcome: Progressing ?  ?Problem: RH SAFETY ?Goal: RH STG ADHERE TO SAFETY PRECAUTIONS W/ASSISTANCE/DEVICE ?Description: STG Adhere to Safety Precautions With cues Assistance/Device. ?Outcome: Progressing ?  ?

## 2023-07-21 NOTE — Progress Notes (Signed)
 Speech Language Pathology Daily Session Note  Patient Details  Name: [name]. MRN: [id number] Date of Birth: 1950/04/27  Today's Date: 07/21/2023 SLP Individual Time: 1101-1200 SLP Individual Time Calculation (min): 59 min  Short Term Goals: Week 1: SLP Short Term Goal 1 (Week 1): Patient will solve complex financial problems with 90% accuracy with modI. SLP Short Term Goal 2 (Week 1): Patient will demonstrate recall of pertinent daily complex information with 90% accuracy with modI.  Skilled Therapeutic Interventions: Skilled therapy session focused on cognitive goals. SLP facilitated completion of medication management task to encourage use of problem solving, memory and organizational skills. SLP provided modA for patient to complete BID pillbox according to written and verbalized directions. Patient continually required modA to attend to the left side and correct mistakes. SLP educate patient on continuing to utilize pillbox upon d/c to aid in planning, organization, and memory. Patient verbalized understanding. Patient left in chair with alarm set and call bell in reach. Continue POC.   Pain Denies  Therapy/Group: Individual Therapy  Kamiyah Kindel M.A., CCC-SLP 07/21/2023, 7:39 AM

## 2023-07-21 NOTE — Progress Notes (Signed)
 Occupational Therapy Session Note  Patient Details  Name: [name]. MRN: [id number] Date of Birth: November 09, 1950  Today's Date: 07/21/2023 OT Individual Time: 8413-2440 OT Individual Time Calculation (min): 71 min    Short Term Goals: Week 1:  OT Short Term Goal 1 (Week 1): Pt will perform sit <> stands at LRAD with MOD A OT Short Term Goal 2 (Week 1): Pt will perform toilet transfer with LRAD and MOD of 1 OT Short Term Goal 3 (Week 1): Pt will improve MMT/ROM of LUE to 2/5 OT Short Term Goal 4 (Week 1): Pt will utilize hemitechniques for dressing consistently with MIN cues  Skilled Therapeutic Interventions/Progress Updates:  Pt greeted seated in recliner, pt agreeable to OT intervention.    Pts wife present throughout session.   Transfers/bed mobility/functional mobility:  Pt completed sit>stand to stedy with MIN A with LUE support on rail of stedy. Dependent transfer in stedy from recliner>w/c.   Pt did work on squat pivots to from w/c>EOM to L side with MAX A +2 as pt wanting to push back during transfer needing max A/ cues to keep pt positioned forward. Completed transfer in short incremental scoots for safety with LLE blocked and LUE supported. OTA positioned in front of pt and tech in the back assisting with scooting.    NMR:  Worked on sit>stands from outside of parallel bars with LUE positioned in sling for supported and LLE blocked. Pt able to stand at bars with MODA +1. Pt presents with extensor synergy in LLE causing LLE to straighten and pull into midline, however pt was able to accept weight on LLE while RLE stepping up on 1 inch step with BUE support from bars. Pt completed x10 reps. Attempted same step ups with LLE however LLE continued to pull into synergy pattern making task unsafe.   Pt completed sit>stands from mat table as well for NMR to LLE. Pt completed stand with MODA +2 with LUE supported and LLE blocked, however when beginning to work on lateral weight  shifts LLE begins to pull into synergy  Trialed use of eva walker during sit>stand trials with Leg loops positioned on LLE in an attempt to be able to position LLE better during stands. Pt stood at [employer] walker with MODA +2 however unable to effectively support LUE and manage LLE d/t extensor synergy.                   Noted pt to have trace activation in L digits therefore did issue pt compliant cube to work on functional grasp, pt appreciative. Pt also does have activation in L elbow but compensating noted in shoulder.    Ended session with pt supine in bed with all needs within reach and bed alarm activated.                    Therapy Documentation Precautions:  Precautions Precautions: Fall Precaution/Restrictions Comments: falls, L hemi, mild L sided inattention with L lateral lean Restrictions Weight Bearing Restrictions Per Provider Order: No  Pain: No pain reported during session    Therapy/Group: Individual Therapy  Mollie Anger [facility] 07/21/2023, 3:13 PM

## 2023-07-21 NOTE — Progress Notes (Signed)
 Physical Therapy Session Note  Patient Details  Name: [name]. MRN: [id number] Date of Birth: 1950/11/25  Today's Date: 07/21/2023 PT Individual Time: 0918-1015 PT Individual Time Calculation (min): 57 min   Short Term Goals: Week 1:  PT Short Term Goal 1 (Week 1): Pt will complete bed mobility mod assist PT Short Term Goal 2 (Week 1): Pt will complete sit<>stand transfers with LRAD with min assist PT Short Term Goal 3 (Week 1): Pt will complete bed<>chair transfers with LRAD with mod assist PT Short Term Goal 4 (Week 1): Pt will ambulate with LRAD 60' with mod assist  Skilled Therapeutic Interventions/Progress Updates: Pt presents sitting in recliner and agreeable to therapy.  Pt back in corner so Stedy utilized for transfer to w/c.  Pt required mod/max A for sit to stand.  Pt performed standing in Stedy for increased WB through LLE, cues for maintaining weight off of front bar.  Pt performed multiple sit to stand transfers from [employer] w/ min A, but L knee hyperextension noted.  Pt wheeled to main gym hallway for gait w/ HR.  Pt requires max A for sit to stand and then for gait.  Pt requires verbal and manual cueing for weight shift to R, LLE advancement and placement, LLE ER.  PT then performed gait w/ leg lifter to LLE but continues to hyperextend and ER.  PT performed 3 musketeer using rail as 2nd, w/ improved LLE advancement and neutral rotation, cues for RLE placement to maintain BOS.  Pt performed step-pivot transfer w/c > bed and then max A for sitting to lying.  Bed alarm on and all needs in reach.     Therapy Documentation Precautions:  Precautions Precautions: Fall Precaution/Restrictions Comments: falls, L hemi, mild L sided inattention with L lateral lean Restrictions Weight Bearing Restrictions Per Provider Order: No General:   Vital Signs:   Pain:0/10 Pain Assessment Pain Scale: 0-10 Pain Score: 0-No pain    Therapy/Group: Individual  Therapy  Ariyannah Pauling P Jayceion Lisenby 07/21/2023, 12:55 PM

## 2023-07-21 NOTE — Plan of Care (Signed)
  Problem: RH BOWEL ELIMINATION Goal: RH STG MANAGE BOWEL WITH ASSISTANCE Description: STG Manage Bowel with  mod I Assistance. Outcome: Progressing Goal: RH STG MANAGE BOWEL W/MEDICATION W/ASSISTANCE Description: STG Manage Bowel with Medication with  mod I Assistance. Outcome: Progressing   Problem: RH SAFETY Goal: RH STG ADHERE TO SAFETY PRECAUTIONS W/ASSISTANCE/DEVICE Description: STG Adhere to Safety Precautions With cues Assistance/Device. Outcome: Progressing   Problem: RH PAIN MANAGEMENT Goal: RH STG PAIN MANAGED AT OR BELOW PT'S PAIN GOAL Description: < 4 with prns Outcome: Progressing

## 2023-07-22 DIAGNOSIS — I63511 Cerebral infarction due to unspecified occlusion or stenosis of right middle cerebral artery: Secondary | ICD-10-CM | POA: Diagnosis not present

## 2023-07-22 DIAGNOSIS — Z794 Long term (current) use of insulin: Secondary | ICD-10-CM

## 2023-07-22 DIAGNOSIS — G8114 Spastic hemiplegia affecting left nondominant side: Secondary | ICD-10-CM | POA: Diagnosis not present

## 2023-07-22 DIAGNOSIS — E1169 Type 2 diabetes mellitus with other specified complication: Secondary | ICD-10-CM | POA: Diagnosis not present

## 2023-07-22 LAB — CBC
HCT: 38.3 % — ABNORMAL LOW (ref 39.0–52.0)
Hemoglobin: 12.3 g/dL — ABNORMAL LOW (ref 13.0–17.0)
MCH: 30.1 pg (ref 26.0–34.0)
MCHC: 32.1 g/dL (ref 30.0–36.0)
MCV: 93.6 fL (ref 80.0–100.0)
Platelets: 361 10*3/uL (ref 150–400)
RBC: 4.09 MIL/uL — ABNORMAL LOW (ref 4.22–5.81)
RDW: 13 % (ref 11.5–15.5)
WBC: 9.6 10*3/uL (ref 4.0–10.5)
nRBC: 0 % (ref 0.0–0.2)

## 2023-07-22 LAB — BASIC METABOLIC PANEL WITH GFR
Anion gap: 12 (ref 5–15)
BUN: 19 mg/dL (ref 8–23)
CO2: 25 mmol/L (ref 22–32)
Calcium: 8.9 mg/dL (ref 8.9–10.3)
Chloride: 103 mmol/L (ref 98–111)
Creatinine, Ser: 1.35 mg/dL — ABNORMAL HIGH (ref 0.61–1.24)
GFR, Estimated: 56 mL/min — ABNORMAL LOW (ref >60)
Glucose, Bld: 168 mg/dL — ABNORMAL HIGH (ref 70–99)
Potassium: 4 mmol/L (ref 3.5–5.1)
Sodium: 140 mmol/L (ref 135–145)

## 2023-07-22 LAB — GLUCOSE, CAPILLARY
Glucose-Capillary: 157 mg/dL — ABNORMAL HIGH (ref 70–99)
Glucose-Capillary: 128 mg/dL — ABNORMAL HIGH (ref 70–99)
Glucose-Capillary: 184 mg/dL — ABNORMAL HIGH (ref 70–99)
Glucose-Capillary: 171 mg/dL — ABNORMAL HIGH (ref 70–99)

## 2023-07-22 NOTE — Progress Notes (Signed)
 PROGRESS NOTE   Subjective/Complaints:  No problems today. Was just tired of being in either bed or chair. Ready to get moving again this morning. Didn't wear PRAFO last night as nobody put it on.   ROS: Patient denies fever, rash, sore throat, blurred vision, dizziness, nausea, vomiting, diarrhea, cough, shortness of breath or chest pain, joint or back/neck pain, headache, or mood change.   Objective:   No results found. Recent Labs    07/20/23 0226 07/22/23 0528  WBC 8.5 9.6  HGB 11.4* 12.3*  HCT 34.8* 38.3*  PLT 312 361   Recent Labs    07/20/23 0226 07/22/23 0528  NA 138 140  K 3.8 4.0  CL 104 103  CO2 23 25  GLUCOSE 173* 168*  BUN 16 19  CREATININE 1.22 1.35*  CALCIUM  8.5* 8.9    Intake/Output Summary (Last 24 hours) at 07/22/2023 1105 Last data filed at 07/22/2023 2956 Gross per 24 hour  Intake 360 ml  Output 450 ml  Net -90 ml        Physical Exam: Vital Signs Blood pressure 109/69, pulse 76, temperature 97.7 F (36.5 C), temperature source Oral, resp. rate 18, SpO2 98%.   Constitutional: No distress . Vital signs reviewed. HEENT: NCAT, EOMI, oral membranes moist Neck: supple Cardiovascular: RRR without murmur. No JVD    Respiratory/Chest: CTA Bilaterally without wheezes or rales. Normal effort    GI/Abdomen: BS +, non-tender, non-distended Ext: no clubbing, cyanosis, or edema Psych: pleasant and cooperative  Skin: No evidence of breakdown, no evidence of rash Neurologic: Cranial nerves II through XII intact, motor strength is 5/5 in right and 2-/5 left deltoid, bicep, tricep, grip, hip flexor, knee extensors, ankle dorsiflexor and plantar flexor. Flexor tone in left fingers/wrist. DTR's brisk. Toes up Sensory exam normal sensation to light touch  in bilateral upper and lower extremities Cerebellar exam remains limited by weakness on the left side  Musculoskeletal: Full passive  range of  motion in all 4 extremities. No joint swelling   Assessment/Plan: 1. Functional deficits which require 3+ hours per day of interdisciplinary therapy in a comprehensive inpatient rehab setting. Physiatrist is providing close team supervision and 24 hour management of active medical problems listed below. Physiatrist and rehab team continue to assess barriers to discharge/monitor patient progress toward functional and medical goals  Care Tool:  Bathing    Body parts bathed by patient: Left arm, Chest, Abdomen, Right upper leg, Left upper leg, Face   Body parts bathed by helper: Right arm, Front perineal area, Buttocks, Right lower leg, Left lower leg     Bathing assist Assist Level: Maximal Assistance - Patient 24 - 49%     Upper Body Dressing/Undressing Upper body dressing   What is the patient wearing?: Pull over shirt    Upper body assist Assist Level: Maximal Assistance - Patient 25 - 49%    Lower Body Dressing/Undressing Lower body dressing      What is the patient wearing?: Pants     Lower body assist Assist for lower body dressing: Dependent - Patient 0%     Toileting Toileting    Toileting assist Assist for toileting: Total Assistance -  Patient < 25%     Transfers Chair/bed transfer  Transfers assist     Chair/bed transfer assist level: 2 Helpers (MAX A +2 squat pivot)     Locomotion Ambulation   Ambulation assist   Ambulation activity did not occur: Safety/medical concerns (ambulated 25' with RHR in hallway mod assist and +2 WC follow)  Assist level: Maximal Assistance - Patient 25 - 49% Assistive device:  (hand rail as well as HR and under L shoulder.) Max distance: 30   Walk 10 feet activity   Assist  Walk 10 feet activity did not occur: Safety/medical concerns  Assist level: Maximal Assistance - Patient 25 - 49% Assistive device:  (hand rail as well as HR and under L shoulder.)   Walk 50 feet activity   Assist Walk 50 feet with 2  turns activity did not occur: Safety/medical concerns         Walk 150 feet activity   Assist Walk 150 feet activity did not occur: Safety/medical concerns         Walk 10 feet on uneven surface  activity   Assist Walk 10 feet on uneven surfaces activity did not occur: Safety/medical concerns         Wheelchair     Assist Is the patient using a wheelchair?: Yes Type of Wheelchair: Manual    Wheelchair assist level: Dependent - Patient 0%      Wheelchair 50 feet with 2 turns activity    Assist        Assist Level: Dependent - Patient 0%   Wheelchair 150 feet activity     Assist      Assist Level: Dependent - Patient 0%   Blood pressure 109/69, pulse 76, temperature 97.7 F (36.5 C), temperature source Oral, resp. rate 18, SpO2 98%.  Medical Problem List and Plan: 1. Functional deficits secondary to right MCA infarct             -patient may shower             -ELOS/Goals: 10-12 days, supervision goals with PT and SLP and sup/min assist with OT             -ordered left PRAFO and WHO for pressure relief/tone mgt--needs these on each night  -Continue CIR therapies including PT, OT, and SLP  2.  Antithrombotics: -DVT/anticoagulation:  Pharmaceutical: Lovenox              -antiplatelet therapy: ASA 3. Pain Management: Tylenol prn.  4. Mood/Behavior/Sleep: LCSW to follow for evaluation and support.              -antipsychotic agents: N/A 5. Neuropsych/cognition: This patient is capable of making decisions on his own behalf. 6. Skin/Wound Care: LCSW to follow for evaluation and support             --Gerhardt's butt cream to MASD on buttocks             --Diflucan for candida cruris.              --pressure relief measures to left elbow/left heel.  7. Fluids/Electrolytes/Nutrition:  encourage appropriate PO    Latest Ref Rng & Units 07/22/2023    5:28 AM 07/20/2023    2:26 AM 01/02/2016    3:51 PM  BMP  Glucose 70 - 99 mg/dL 409  811  914    BUN 8 - 23 mg/dL 19  16  34   Creatinine 0.61 - 1.24 mg/dL 7.82  9.56  1.46   Sodium 135 - 145 mmol/L 140  138  134   Potassium 3.5 - 5.1 mmol/L 4.0  3.8  5.5   Chloride 98 - 111 mmol/L 103  104  108   CO2 22 - 32 mmol/L 25  23  18    Calcium  8.9 - 10.3 mg/dL 8.9  8.5  9.1      1/61 Cr sl elevated--might be near baseline 8. T2DM: Hgb A1C- 8.2 (PTA) Was taking Tresiba 54 units in am ( now on Semglee  22U ) and  Metformin 1000 mg--bid (restart Metformin at 500mg  BID)   CBG (last 3)  Recent Labs    07/21/23 1625 07/21/23 2110 07/22/23 0612  GLUCAP 185* 229* 157*   Increased Semglee  to 25 U on 4/27    -monitor for consistent pattern 9.   HTN: Monitor BP TID--losartan 50 mg daily--was held for a few days to prevent hypoperfusion             -- parameters to hold prn sitting SBP< 120.  Vitals:   07/21/23 2010 07/22/23 0346  BP: 113/72 109/69  Pulse: 82 76  Resp: 18 18  Temp: 98.4 F (36.9 C) 97.7 F (36.5 C)  SpO2: 99% 98%    10.  Myasthenia Gravis Mycophenolate  1500/1250mg  --Dr. Franklin Ito at [facility].              Prednisone  5 mg PTA--->has been weaned down to 2 mg daily, monitor for Ptosis and generalized proximal weakness 11. Hypothyroid: Stable on levothyroxine for supplement.  12.  Dyslipidemia: Was on pravastatin 80 mg daily 13. H/o Kidney stones: On Sodium bicarb BID to prevent stones.  14. Constipation: On miralax daily             --sorbitol added.   -last bm 4/26 15. H/o GERD: Has been off PPI.   LOS: 3 days A FACE TO FACE EVALUATION WAS PERFORMED  Rawland Caddy 07/22/2023, 11:05 AM

## 2023-07-22 NOTE — Care Management (Signed)
 Inpatient Rehabilitation Center Individual Statement of Services  Patient Name:  [name].  Date:  07/22/2023  Welcome to the Inpatient Rehabilitation Center.  Our goal is to provide you with an individualized program based on your diagnosis and situation, designed to meet your specific needs.  With this comprehensive rehabilitation program, you will be expected to participate in at least 3 hours of rehabilitation therapies Monday-Friday, with modified therapy programming on the weekends.  Your rehabilitation program will include the following services:  Physical Therapy (PT), Occupational Therapy (OT), Speech Therapy (ST), 24 hour per day rehabilitation nursing, Therapeutic Recreaction (TR), Psychology, Neuropsychology, Care Coordinator, Rehabilitation Medicine, Nutrition Services, Pharmacy Services, and Other  Weekly team conferences will be held on Wednesdays to discuss your progress.  Your Inpatient Rehabilitation Care Coordinator will talk with you frequently to get your input and to update you on team discussions.  Team conferences with you and your family in attendance may also be held.  Expected length of stay: 14-21 days    Overall anticipated outcome: Supervision  Depending on your progress and recovery, your program may change. Your Inpatient Rehabilitation Care Coordinator will coordinate services and will keep you informed of any changes. Your Inpatient Rehabilitation Care Coordinator's name and contact numbers are listed  below.  The following services may also be recommended but are not provided by the Inpatient Rehabilitation Center:  Driving Evaluations Home Health Rehabiltiation Services Outpatient Rehabilitation Services Vocational Rehabilitation   Arrangements will be made to provide these services after discharge if needed.  Arrangements include referral to agencies that provide these services.  Your insurance has been verified to be:  [employer] Medicare  Your  primary doctor is:  Glenora Laos  Pertinent information will be shared with your doctor and your insurance company.  Inpatient Rehabilitation Care Coordinator:  Kathey Pang [phone] or ([phone]  Information discussed with and copy given to patient by: Rennis Case, 07/22/2023, 11:51 AM

## 2023-07-22 NOTE — Progress Notes (Signed)
 Inpatient Rehabilitation  Patient information reviewed and entered into eRehab system by [facility]. Karen Kays., CCC/SLP, PPS Coordinator.  Information including medical coding, functional ability and quality indicators will be reviewed and updated through discharge.

## 2023-07-22 NOTE — IPOC Note (Signed)
 Overall Plan of Care [facility]) Patient Details Name: [name]. MRN: [id number] DOB: Aug 01, 1950  Admitting Diagnosis: Acute ischemic right MCA stroke [facility])  Hospital Problems: Principal Problem:   Acute ischemic right MCA stroke (HCC)     Functional Problem List: Nursing Pain, Safety, Endurance, Medication Management, Bowel  PT Balance, Endurance, Edema, Motor, Perception, Safety, Sensory  OT Balance, Edema, Endurance, Motor, Perception, Safety, Pain, Sensory, Skin Integrity, Vision  SLP Cognition  TR         Basic ADL's: OT Eating, Grooming, Bathing, Dressing, Toileting     Advanced  ADL's: OT       Transfers: PT Bed to Chair, Car, Bed Mobility  OT Toilet, Tub/Shower     Locomotion: PT Ambulation, Stairs     Additional Impairments: OT Fuctional Use of Upper Extremity  SLP Social Cognition   Problem Solving, Memory  TR      Anticipated Outcomes Item Anticipated Outcome  Self Feeding MOD I  Swallowing      Basic self-care  MIN A  Toileting  MIN A   Bathroom Transfers CGA  Bowel/Bladder  manage bowel w mod I assist  Transfers  CGA  Locomotion  min assist gait, modI WC  Communication     Cognition  modI  Pain  Pain < 4 with prns  Safety/Judgment  manage safety w cues   Therapy Plan: PT Intensity: Minimum of 1-2 x/day ,45 to 90 minutes PT Frequency: 5 out of 7 days PT Duration Estimated Length of Stay: 2-3 weeks OT Intensity: Minimum of 1-2 x/day, 45 to 90 minutes OT Frequency: 5 out of 7 days OT Duration/Estimated Length of Stay: 16-18 days SLP Intensity: Minumum of 1-2 x/day, 30 to 90 minutes SLP Frequency: 1 to 3 out of 7 days SLP Duration/Estimated Length of Stay: 10-12   Team Interventions: Nursing Interventions Patient/Family Education, Pain Management, Medication Management, Bowel Management, Disease Management/Prevention, Discharge Planning  PT interventions Ambulation/gait training, Discharge planning, Functional mobility  training, Psychosocial support, Therapeutic Activities, Visual/perceptual remediation/compensation, Warden/ranger, Disease management/prevention, Neuromuscular re-education, Therapeutic Exercise, Wheelchair propulsion/positioning, Cognitive remediation/compensation, DME/adaptive equipment instruction, Pain management, Splinting/orthotics, UE/LE Strength taining/ROM, Community reintegration, Development worker, international aid stimulation, Patient/family education, Museum/gallery curator, UE/LE Coordination activities  OT Interventions Warden/ranger, Functional electrical stimulation, Self Care/advanced ADL retraining, UE/LE Coordination activities, Cognitive remediation/compensation, Functional mobility training, Skin care/wound managment, Visual/perceptual remediation/compensation, Firefighter, Wheelchair propulsion/positioning, Splinting/orthotics, Neuromuscular re-education, Discharge planning, Pain management, Therapeutic Activities, Disease mangement/prevention, Patient/family education, Therapeutic Exercise, DME/adaptive equipment instruction, Psychosocial support, UE/LE Strength taining/ROM  SLP Interventions Cueing hierarchy, Cognitive remediation/compensation, Internal/external aids, Therapeutic Activities, Functional tasks, Patient/family education  TR Interventions    SW/CM Interventions Discharge Planning, Psychosocial Support, Patient/Family Education   Barriers to Discharge MD  Medical stability  Nursing Decreased caregiver support, Home environment access/layout 2 level main B+B, 3 ste , right rail w spouse  PT Inaccessible home environment, Decreased caregiver support, Home environment access/layout requires mod/max assist for mobility at this time, wife would be unable to assist at this time, 3 STE with RHR  OT      SLP      SW Decreased caregiver support, Lack of/limited family support, Community education officer for SNF coverage     Team Discharge Planning: Destination:  PT-Home ,OT- Home , SLP-Home Projected Follow-up: PT-Home health PT, OT-  Home health OT, SLP-None Projected Equipment Needs: PT-To be determined, OT- To be determined, SLP-None recommended by SLP Equipment Details: PT- , OT-  Patient/family involved in discharge planning: PT- Patient, Family member/caregiver,  OT-Patient, Family member/caregiver, SLP-Patient, Family member/caregiver  MD ELOS: 15-20 days Medical Rehab Prognosis:  Excellent Assessment: The patient has been admitted for CIR therapies with the diagnosis of right mca infarct. The team will be addressing functional mobility, strength, stamina, balance, safety, adaptive techniques and equipment, self-care, bowel and bladder mgt, patient and caregiver education, NMR,spasticity rx, cognition and community reintegration. Goals have been set at min assist to contact guard assist with mobility and self-care and mod I with cognition. Anticipated discharge destination is home with wife.        See Team Conference Notes for weekly updates to the plan of care

## 2023-07-22 NOTE — Plan of Care (Signed)
  Problem: RH BOWEL ELIMINATION Goal: RH STG MANAGE BOWEL WITH ASSISTANCE Description: STG Manage Bowel with  mod I Assistance. Outcome: Progressing Goal: RH STG MANAGE BOWEL W/MEDICATION W/ASSISTANCE Description: STG Manage Bowel with Medication with  mod I Assistance. Outcome: Progressing   Problem: RH SAFETY Goal: RH STG ADHERE TO SAFETY PRECAUTIONS W/ASSISTANCE/DEVICE Description: STG Adhere to Safety Precautions With cues Assistance/Device. Outcome: Progressing   Problem: RH PAIN MANAGEMENT Goal: RH STG PAIN MANAGED AT OR BELOW PT'S PAIN GOAL Description: < 4 with prns Outcome: Progressing

## 2023-07-22 NOTE — Progress Notes (Signed)
 Speech Language Pathology Daily Session Note  Patient Details  Name: [name]. MRN: [id number] Date of Birth: 1950/11/19  Today's Date: 07/22/2023 SLP Individual Time: 7829-5621 SLP Individual Time Calculation (min): 55 min  Short Term Goals: Week 1: SLP Short Term Goal 1 (Week 1): Patient will solve complex financial problems with 90% accuracy with modI. SLP Short Term Goal 2 (Week 1): Patient will demonstrate recall of pertinent daily complex information with 90% accuracy with modI.  Skilled Therapeutic Interventions:  Patient was seen in am to address cognitive re- training. Pt was alert and seated upright in recliner in his room. He denied pain and was agreeable for session. Pt's wife present and participating intermittently throughout. Pt oriented x4 and able to verbalize PLOF and events leading up to stroke. He presented with improved awareness of cognitive deficits demonstrating emergent awareness. SLP challenged pt in mildly complex scheduling task with pt warranting min A to attend to left visual field. Pt completed task without errors however, warranting min A to ensure completion of all events. Pt subsequently challenged in complex financial problem solving through solving word problems. Pt completed task with 75% acc indep improving to 88% acc with min A. In other minutes of session SLP trained pt in 'BE FAST' stroke symptoms. After a distracted 2-3 minute delay, pt recalled 3 units of information indep. At conclusion of session, pt was left upright in recliner with call button within reach and chair alarm active. SLP to continue POC.   Pain Pain Assessment Pain Scale: 0-10 Pain Score: 0-No pain  Therapy/Group: Individual Therapy  Adela Holter 07/22/2023, 9:29 AM

## 2023-07-22 NOTE — Progress Notes (Signed)
 Occupational Therapy Session Note  Patient Details  Name: [name]. MRN: [id number] Date of Birth: [birth year]  Today's Date: 07/22/2023 OT Individual Time: 517 [id number]  OT Individual Time Calculation (min): 59 min    Short Term Goals: Week 1:  OT Short Term Goal 1 (Week 1): Pt will perform sit <> stands at LRAD with MOD A OT Short Term Goal 2 (Week 1): Pt will perform toilet transfer with LRAD and MOD of 1 OT Short Term Goal 3 (Week 1): Pt will improve MMT/ROM of LUE to 2/5 OT Short Term Goal 4 (Week 1): Pt will utilize hemitechniques for dressing consistently with MIN cues  Skilled Therapeutic Interventions/Progress Updates:  Session 1: Pt greeted supine in bed, pt agreeable to OT intervention but reports being frustrated about not being able to move around in room and wanting to go home. Education provided on pt needing to progress more before we can allow him to move around in room. Pts wife also entered at beginning of session and wife agreeable that pt needs to stay to get more independent.   Transfers/bed mobility/functional mobility:  Pt completed supine>sit with MAXA to elevate trunk into sitting and assist needed to maneuver BLEs to EOB. Ptwith poor sitting balance needing MODA to maintain midline d/t L/posterior lean.    NMR:  Positioned pts LUE into saebo arm immobilizer with pt able to complete gravity eliminated Active assist ROM via scapular protraction/retraction with mirror positioned in front of pt for visual feedback.   Ended session with pt seated in recliner with all needs within reach and safety belt alarm activated.                   Session 2:  Pt greeted seated in recliner, pt agreeable to OT intervention.      Transfers/bed mobility/functional mobility: pt completed squat pivot to EOM to R side with MODA +2. Improved ability to lean forward and manage hemibody this session.  Worked on sit>stands from [facility] with hemi walker, pt able to stand  with MIN- MODA +2 however LLE kicks back into hyperextension against the mat during stands.   Pt completed stand pivot from EOM>w/c with HW with pt needing step by step cues to sequence pivotal steps and assist to mange LLE during transfer, MAX A +2 overall d/t pushing to L ( positioned gait belt around L quad and physically manage extremity during transfer).     ADLs:  Transfers: dependent transfer to [facility] over toilet in stedy for time mgmt.  Toileting: Pt stood in stedy while OTA managed urinal and tech provided trunk support. Pt with continent b/b void needing MAX A +2 for 3/3 toileting tasks.    NMR:  Worked on targeted steps with LLE for NMR and to improve motor planning with pt using HW for support on R side and pt instructed to step to targets on floor. Pt needed manual assist to step LLE to targets ( positioned gait belt around L quad to create leg loop). Pt needed MAX A +2 d/t L lean.                 Ended session with pt supine in bed with all needs within reach and bed alarm activated.                    Therapy Documentation Precautions:  Precautions Precautions: Fall Precaution/Restrictions Comments: falls, L hemi, mild L sided inattention with L lateral lean Restrictions Weight Bearing  Restrictions Per Provider Order: No  Pain: Session 1: no pain  Session 2: no pain     Therapy/Group: Individual Therapy  Mollie Anger [facility] 07/22/2023, 12:14 PM

## 2023-07-22 NOTE — Progress Notes (Signed)
 Patient ID: Douglas Cobia., male   DOB: [birth year], 73 y.o.   MRN: [id number]  [phone]- SW spoke with pt wife [name] to introduce self,explain role, discuss discharge, and inform on ELOS. She reports she will be primary caregiver. SW will follow-up with updates after team conference.   Norval Been, MSW, LCSW Office: [phone] Cell: [phone] Fax: [phone]

## 2023-07-22 NOTE — Progress Notes (Signed)
 Inpatient Rehabilitation Care Coordinator Assessment and Plan Patient Details  Name: [name]. MRN: [id number] Date of Birth: 01-Dec-1950  Today's Date: 07/22/2023  Hospital Problems: Principal Problem:   Acute ischemic right MCA stroke [facility])  Past Medical History:  Past Medical History:  Diagnosis Date   Anemia    Cervical myelopathy (HCC)    Chronic venous insufficiency    [name] (chronic kidney disease), stage III (HCC)    Diabetes mellitus without complication (HCC)    GERD (gastroesophageal reflux disease)    Hypercholesteremia    Hypothyroidism    Kidney stones    about 50 in the past--last in March 4/24   Leg weakness, bilateral    due to Myasthenia Gravis   Myasthenia gravis (HCC)    Spinal stenosis    Vitamin B 12 deficiency    Vitamin B 12 deficiency    Wears hearing aid    bilateral   Past Surgical History:  Past Surgical History:  Procedure Laterality Date   BACK SURGERY  09/2015   CARDIAC CATHETERIZATION     CATARACT EXTRACTION W/ INTRAOCULAR LENS IMPLANT     CATARACT EXTRACTION W/PHACO Right 12/21/2015   Procedure: CATARACT EXTRACTION PHACO AND INTRAOCULAR LENS PLACEMENT (IOC);  Surgeon: Annell Kidney, MD;  Location: [facility];  Service: Ophthalmology;  Laterality: Right;  DIABETIC - insulin  and oral meds   COLONOSCOPY     SHOULDER SURGERY     labrum repair   TONSILLECTOMY     Social History:  reports that he has never smoked. He has never used smokeless tobacco. He reports that he does not drink alcohol and does not use drugs.  Family / Support Systems Marital Status: Married How Long?: 49 years (anniversary in July) Patient Roles: Parent, Spouse Spouse/Significant Other: Wife-[name] Children: 1 dtr- PRN support due to working full time Other Supports: None Anticipated Caregiver: Wife primary caregiver Ability/Limitations of Caregiver: Wife will be primary caregiver Caregiver Availability: 24/7 Family Dynamics: Pt lives  with his wife.  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 Education: Audiological scientist - How often do you need to have someone help you when you read instructions, pamphlets, or other written material from your doctor or pharmacy?: Never Writes: Yes Employment Status: Employed Name of Employer: self-employed Return to Work Plans: TBD Marine scientist Issues: Denies Guardian/Conservator: Scientific laboratory technician (wife)   Abuse/Neglect Abuse/Neglect Assessment Can Be Completed: Yes Physical Abuse: Denies Verbal Abuse: Denies Sexual Abuse: Denies Exploitation of patient/patient's resources: Denies Self-Neglect: Denies  Patient response to: Social Isolation - How often do you feel lonely or isolated from those around you?: Never  Emotional Status Pt's affect, behavior and adjustment status: Pt in good spirits at time of visit. Recent Psychosocial Issues: Anxious and frustrated since being in here. Psychiatric History: Denies Substance Abuse History: Denies  Patient / Family Perceptions, Expectations & Goals Pt/Family understanding of illness & functional limitations: Pt and wife have a general understanding of care needs Premorbid pt/family roles/activities: Independent Anticipated changes in roles/activities/participation: Assistance with ADLs/IADLs Pt/family expectations/goals: "Get up and walk enough so they feel comfortable with me going home"  [employer]: None Premorbid Home Care/DME Agencies: Other (Comment) (DME- canes, RW, w/c, 3in1 BSC, built in shower seat. PLans to purchase power w/c.) Transportation available at discharge: Wife Is the patient able to respond to transportation needs?: Yes In the  past 12 months, has lack of transportation kept you from medical  appointments or from getting medications?: No In the past 12 months, has lack of transportation kept you from meetings, work, or from getting things needed for daily living?: No Resource referrals recommended: Neuropsychology  Discharge Planning Living Arrangements: Spouse/significant other Support Systems: Spouse/significant other, Children Type of Residence: Private residence Insurance Resources: Media planner (specify) ([employer] Medicare) Financial Resources: Employment Financial Screen Referred: No Living Expenses: Banker Management: Spouse, Patient Does the patient have any problems obtaining your medications?: No Home Management: Pt and wife both prepare meals; wife primary housecleanig Patient/Family Preliminary Plans: TBD Care Coordinator Barriers to Discharge: Decreased caregiver support, Lack of/limited family support, Insurance for SNF coverage Care Coordinator Anticipated Follow Up Needs: HH/OP Expected length of stay: 14-21 days  Clinical Impression SW met with pt and pt wife in room to introduce self, explain role, and discuss discharge process. Pt is not a Cytogeneticist. No HCPOA. SW will follow-up with updates.   Kenijah Benningfield A Michah Minton 07/22/2023, 12:42 PM

## 2023-07-23 DIAGNOSIS — I63511 Cerebral infarction due to unspecified occlusion or stenosis of right middle cerebral artery: Secondary | ICD-10-CM | POA: Diagnosis not present

## 2023-07-23 DIAGNOSIS — G7000 Myasthenia gravis without (acute) exacerbation: Secondary | ICD-10-CM | POA: Diagnosis not present

## 2023-07-23 DIAGNOSIS — E1169 Type 2 diabetes mellitus with other specified complication: Secondary | ICD-10-CM | POA: Diagnosis not present

## 2023-07-23 DIAGNOSIS — I639 Cerebral infarction, unspecified: Secondary | ICD-10-CM | POA: Diagnosis not present

## 2023-07-23 LAB — GLUCOSE, CAPILLARY
Glucose-Capillary: 131 mg/dL — ABNORMAL HIGH (ref 70–99)
Glucose-Capillary: 245 mg/dL — ABNORMAL HIGH (ref 70–99)
Glucose-Capillary: 170 mg/dL — ABNORMAL HIGH (ref 70–99)
Glucose-Capillary: 204 mg/dL — ABNORMAL HIGH (ref 70–99)

## 2023-07-23 MED ORDER — ENOXAPARIN SODIUM 60 MG/0.6ML IJ SOSY
60.0000 mg | PREFILLED_SYRINGE | INTRAMUSCULAR | Status: DC
  Administered 2023-07-23 – 2023-08-12 (×21): 60 mg via SUBCUTANEOUS
  Filled 2023-07-23 (×21): qty 0.6

## 2023-07-23 NOTE — Progress Notes (Signed)
 Occupational Therapy Session Note  Patient Details  Name: [name]. MRN: [id number] Date of Birth: Jun 19, 1950  Today's Date: 07/23/2023 OT Individual Time: 8295-6213 OT Individual Time Calculation (min): 72 min    Short Term Goals: Week 1:  OT Short Term Goal 1 (Week 1): Pt will perform sit <> stands at LRAD with MOD A OT Short Term Goal 2 (Week 1): Pt will perform toilet transfer with LRAD and MOD of 1 OT Short Term Goal 3 (Week 1): Pt will improve MMT/ROM of LUE to 2/5 OT Short Term Goal 4 (Week 1): Pt will utilize hemitechniques for dressing consistently with MIN cues  Skilled Therapeutic Interventions/Progress Updates:   PT received in w/c ready for therapy.   Reassessed arm function and movement to determine how to proceed with treatment this session.  See below.     FAST-UL Outcome Measure  Hand-to-mouth (HtM) Movement Starting Position: Participant seated on a standard chair without armrests. Trunk leaning on back support of chair. Both hands placed in pronated position on the ipsilateral middle thigh. Feet placed flat on the floor. If participants have any difficulty in understanding instructions (i.e. aphasia) a visual demonstration is suggested. For each of the 5 tasks of the FAST-UL, the subject at first performs the movement with the less affected UL and then with the affected one. The movement can be repeated 3 times and the best score of the three attempts is assigned.   Instructions: Each subject is asked to move the hand towards the mouth, touch it with fingertips and return to the thigh. Motor task occurs without moving the trunk off the back support and without moving the head toward the hand.   Scoring: Clinical score from 0 to 3 is provided by comparing affected side with less affected one as follows: 0 = no movement at all. 1 = The movement task is not completed (less of 50% of the contralateral HtM movement). 2 = The movement task is not completed  (more of 50% of the contralateral HtM movement but the mouth is not reached) or the movement task is completed with compensations. If the mouth is touched with the wrist or the palm or the movement is performed with head or trunk compensations (flexion of the head and trunk towards the hand) the score is 2.   3 = movement carried out at 100% of the contralateral HtM movement. HtM occurs with adequate shoulder flexion and abduction, elbow flexion, and forearm supination. The mouth is touched with fingertips.  Patient Score: 2   Reach to Target (RtT) Movement Starting Position: Same starting conditions of HtM movement. Instructions: Each subject is asked to move the hand toward a target (i.e. the hand of the examiner) located in front of the subject in the ipsilateral workspace at shoulder height, at a distance corresponding to 100% of the fully extended UL within arm's reach (less affected arm as reference). Participants have to reach, touch the target, and return. Motor task occurs without moving the trunk off the back support. Scoring: Clinical score from 0 to 3 is provided by comparing affected side with less affected one as follows: 0 = no movement at all. 1 = The movement task is not completed (less of 50% of the contralateral RtT movement).  2 = The movement task is not completed (more of 50% of the contralateral RtT movement but the target is not reached) or the movement task is completed with compensations (i.e. the trunk loses contact with the back  support of the chair with forward displacement, shoulder flexion occurs with excessive scapular elevation, or shoulder excessive abduction). If the target is reached with trunk or shoulder compensations for inadequate elbow and finger extension the score is 2.  3 = movement performed at 100% of the contralateral RtT. The target is reached with adequate shoulder flexion, elbow, wrist and finger extension.  Patient Score: 1   Prono-supination (PS)  Movement Starting Position: Same starting conditions of HtM movement. Instructions: Motor task occurs without moving the trunk anteriorly or laterally, the medial side of the humerus is against the body, the forearm is fully pronated with the hand resting on the thigh. Scoring: Clinical score from 0 to 3 is provided by comparing paretic side with less affected one as follows: 0 = no movement at all. 1 = The movement task is not completed (less of 50% of the contralateral PS movement).  2 = The movement task is not completed (more of 50% of the contralateral PS movement but the forearm is not fully supinated) or the movement task is completed with compensations (i.e. excessive trunk inclination, shoulder abduction). If the movement is completed with compensations at elbow, shoulder or trunk level the score is 2. 3 = movement performed at 100% of the contralateral PS (complete supination of the forearm with the dorsal part of the hand in contact with the thigh).   Patient Score: 0   Grasp and Release (GaR) Movement Starting position: Participant seated on a standard chair. Hip and knees in 90 flexion, feet flat on the floor. Upper limb (UL) resting on a table in front of the participant with approximately 90 elbow flexion, forearm pronated and fingers in a relaxed extended and adducted position.  Instructions: The subject performs a grasping movement of a cylindrical rigid glass (at least 6 cm diameter) placed proximally to an imaginary line connecting the distal joints of thumb and index finger. The subject is asked to grasp the glass, lift it at least 2 cm (elbow remains in contact with the table), and release it. Scoring:  Clinical score from 0 to 3 is provided by comparing affected side with less affected one as follows: 0 = No movement. The grasp is not possible. 1 = The movement task is not completed (less of 50% of the task). Some prehension is possible but the grasp is not sufficiently  stable to lift the object; the grasp can be performed with the use of the less affected hand only to stabilize the glass for inadequate hand/finger opening and the release is not possible. Some hand opening is required otherwise the score is 0. 2 = The movement task is not completed (more of 50% of the task). The object is grasped and lifted but it falls or the task is completed using alternative grasping strategies (i.e. multi-pulpar, palmar, digito palmar; grasping and releasing of the object is possible with abnormal orientation of the wrist and fingers toward the object and the forearm is lifted off the table). 3 = The task is completed using the expected pattern (normal orientation of fingers or wrist toward the object, the grasp occurs with thumb and fingers in opposition, forearm supination, elbow flexion; thumb abduction and finger extension to release the object).  Patient Score: 0   Pinch and Release (PaR) Movement Starting position: Same starting conditions of GaR movement The participant performs a PaR movement of a pen placed on a table in the midline of an imaginary line connecting the distal joints of thumb and  index finger. The participants asked to pinch the pen with the tips of thumb and index finger, lift it at least 2 cm (elbow remains in contact with the table), and release it. Clinical score from 0 to 3 is provided by comparing affected side with non-affected one as follows: 0 = No movement. The pinch is not possible. 1 = The movement task is not completed (less of 50% of the task). Some prehension is possible but the pinch is not sufficiently stable to lift the object; the pinch occurs with the use of the less affected hand to stabilize the object for inadequate finger opening and the release is not possible. Some fingers movement is required otherwise the score is 0. 2 = The movement task is not completed (more of 50% of the task). The object is pinched and lifted but it falls or  the task is completed using alternative pinching strategies (e.g. pinching with all the fingers, tripod pinch, pinching and releasing of the object is possible with abnormal orientation of fingers and wrist toward the object and the forearm is lifted off the table). 3 = The task is completed using the expected pattern (normal orientation of fingers or wrist toward the object, the pinch occurs with opposition of pads of index finger and thumb, and wrist extension).  Patient Score: 0  Total score: 3/15  CIR - FULL (Functional Upper Limb Levels)  Levels:  Dependent, Max A, Mod A, Min A, Supervision, Independent, N/A  Proximal Arm Function: Lifts arm to 45 degrees shoulder flexion (to don shirt sleeve)        __mod_______ Lifts arm to 90 degrees shoulder flexion (to wash under arm)       ___max______ Lifts arm above shoulder height (to touch the back of head)          _____dep____ Lifts arm to reach hand to opposite shoulder (to pull shirt sleeve)  ___dep______ Reaches arm behind back (for toileting/ LB dressing skills            __dep_______  Distal Function/Hand Grasp; Holds washcloth during bathing                                                     ____dep______ [employer] item (ie soap/deoderant) (to open with other hand)  _____dep_____ Grasps pants to pull up over thigh/ hip                                           ___dep______ Pours glass of water                                                                       __dep_______ [facility] item with both hands (ie tray, basket, box)                            ___dep______   Pt taken to gym to work on A/arom of shoulder using towel  slides on table.   His foam dressing were quite soiled and checked with nursing about changing out foam dressing on L forearm as I had planned to do estim on forearm.  I changed them out but there was not enough space to apply the pads on dorsal surface for finger extension. Instead applied to volar surface of  forearm for finger flexion estim.  Used estim for 20 min at intensity 28 on small muscle atrophy setting. Pt tolerated well and no adverse reactions.   Integrated functional task of grasping cones and foam block with stimulation phase of estim.    Returned pt to room. 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  Pt has an Advertising account planner.  Found a meditation for stroke recovery for pt to listen to.  Started playing it so pt could do the 10 min meditation before lunch arrived.   Resting in chair with alarm on and all needs met.    Therapy Documentation Precautions:  Precautions Precautions: Fall Precaution/Restrictions Comments: falls, L hemi, mild L sided inattention with L lateral lean Restrictions Weight Bearing Restrictions Per Provider Order: No Pain: Pain Assessment Pain Scale: 0-10 Pain Score: 3  Pain Location: Back Pain Intervention(s): Medication (See eMAR) ADL: ADL Eating: Supervision/safety Grooming: Supervision/safety Upper Body Bathing: Moderate assistance Lower Body Bathing: Dependent Upper Body Dressing: Maximal assistance Lower Body Dressing: Dependent Toileting: Dependent Toilet Transfer: Dependent (stedy)   Therapy/Group: Individual Therapy  Fancy Dunkley 07/23/2023, 11:02 AM

## 2023-07-23 NOTE — Progress Notes (Signed)
 Physical Therapy Session Note  Patient Details  Name: [name]. MRN: [id number] Date of Birth: Jan 31, 1951  Today's Date: 07/23/2023 PT Individual Time: 0935-1020 PT Individual Time Calculation (min): 45 min   Short Term Goals: Week 1:  PT Short Term Goal 1 (Week 1): Pt will complete bed mobility mod assist PT Short Term Goal 2 (Week 1): Pt will complete sit<>stand transfers with LRAD with min assist PT Short Term Goal 3 (Week 1): Pt will complete bed<>chair transfers with LRAD with mod assist PT Short Term Goal 4 (Week 1): Pt will ambulate with LRAD 38' with mod assist  Skilled Therapeutic Interventions/Progress Updates: Patient supine in bed on entrance to room. Patient alert and agreeable to PT session.   Patient reported no pain, only tightness in mid-low back from laying in bed/sitting in recliner for hours at a time. Beginning of session focused on lengthening hip musculature/L LE to improve sitting tolerance in WC (pt educated that sitting in [facility] would be better to improve upright sitting posture, and to stretch hip extensors vs sitting in reclined/supine positions - pt reported sitting in [facility] feels a lot better than other spaces).   Therapeutic Activity: Bed Mobility: Pt performed supine<sit on EOB with mod/maxA. VC required for sequence and hand placement, and to advance L LE off L side of bed first prior to R. Pt sitting EOB with push posteriorly to the L Transfers: Pt performed sit<>stand transfers throughout session with STEDY and with minA to stand. Provided VC for anterior weight shift.  Therapeutic Exercise: Pt performed the following exercises with therapist providing the described cuing and facilitation for improvement. - Hip rotation with B knees bent supine in bed (PTA stabilizing L LE due to increased extensor tone). Pt required min/heavy minA to obtain further stretch. - 90/90 L LE with minA to maintain neutral L LE alignment per ER presentation - SLR L LE with  PTA passively moving L LE into available ROM (decreased due to tone) with VC for pt to control eccentric - L hip ER stretch supine in bed (pt initially cued to perform supine hip abduction on L with inability to obtain neutral alignment of L hip)  Patient sitting in WC at end of session with brakes locked, belt alarm set, and all needs within reach.      Therapy Documentation Precautions:  Precautions Precautions: Fall Precaution/Restrictions Comments: falls, L hemi, mild L sided inattention with L lateral lean Restrictions Weight Bearing Restrictions Per Provider Order: No  Therapy/Group: Individual Therapy  Alphons Burgert PTA 07/23/2023, 12:12 PM

## 2023-07-23 NOTE — Progress Notes (Signed)
 PROGRESS NOTE   Subjective/Complaints:  No issues overnite, has back pain , normally takes ibuprofen at home, discussed that post CVA tylenol is preferable due to reduced stroke risk   ROS: Patient denies CP, SOB, N/V/D Objective:   No results found. Recent Labs    07/22/23 0528  WBC 9.6  HGB 12.3*  HCT 38.3*  PLT 361   Recent Labs    07/22/23 0528  NA 140  K 4.0  CL 103  CO2 25  GLUCOSE 168*  BUN 19  CREATININE 1.35*  CALCIUM  8.9    Intake/Output Summary (Last 24 hours) at 07/23/2023 4098 Last data filed at 07/23/2023 0804 Gross per 24 hour  Intake 958 ml  Output 200 ml  Net 758 ml        Physical Exam: Vital Signs Blood pressure 103/61, pulse 61, temperature 98.2 F (36.8 C), resp. rate 17, SpO2 97%.   General: No acute distress Mood and affect are appropriate Heart: Regular rate and rhythm no rubs murmurs or extra sounds Lungs: Clear to auscultation, breathing unlabored, no rales or wheezes Abdomen: Positive bowel sounds, soft nontender to palpation, nondistended Extremities: No clubbing, cyanosis, or edema Skin: No evidence of breakdown, no evidence of rash  Skin: No evidence of breakdown, no evidence of rash Neurologic: Cranial nerves II through XII intact, motor strength is 5/5 in right and 2-/5 left deltoid, bicep, tricep, grip, hip flexor, knee extensors, ankle dorsiflexor and plantar flexor. Flexor tone in left fingers/wrist. DTR's brisk. Toes up Sensory exam normal sensation to light touch  in bilateral upper and lower extremities Cerebellar exam remains limited by weakness on the left side  Musculoskeletal: Full passive  range of motion in all 4 extremities. No joint swelling   Assessment/Plan: 1. Functional deficits which require 3+ hours per day of interdisciplinary therapy in a comprehensive inpatient rehab setting. Physiatrist is providing close team supervision and 24 hour  management of active medical problems listed below. Physiatrist and rehab team continue to assess barriers to discharge/monitor patient progress toward functional and medical goals  Care Tool:  Bathing    Body parts bathed by patient: Left arm, Chest, Abdomen, Right upper leg, Left upper leg, Face   Body parts bathed by helper: Right arm, Front perineal area, Buttocks, Right lower leg, Left lower leg     Bathing assist Assist Level: Maximal Assistance - Patient 24 - 49%     Upper Body Dressing/Undressing Upper body dressing   What is the patient wearing?: Pull over shirt    Upper body assist Assist Level: Maximal Assistance - Patient 25 - 49%    Lower Body Dressing/Undressing Lower body dressing      What is the patient wearing?: Pants     Lower body assist Assist for lower body dressing: Dependent - Patient 0%     Toileting Toileting    Toileting assist Assist for toileting: Total Assistance - Patient < 25%     Transfers Chair/bed transfer  Transfers assist     Chair/bed transfer assist level: 2 Helpers (MAX A +2 squat pivot)     Locomotion Ambulation   Ambulation assist   Ambulation activity did not occur: Safety/medical  concerns (ambulated 84' with RHR in hallway mod assist and +2 WC follow)  Assist level: Maximal Assistance - Patient 25 - 49% Assistive device:  (hand rail as well as HR and under L shoulder.) Max distance: 30   Walk 10 feet activity   Assist  Walk 10 feet activity did not occur: Safety/medical concerns  Assist level: Maximal Assistance - Patient 25 - 49% Assistive device:  (hand rail as well as HR and under L shoulder.)   Walk 50 feet activity   Assist Walk 50 feet with 2 turns activity did not occur: Safety/medical concerns         Walk 150 feet activity   Assist Walk 150 feet activity did not occur: Safety/medical concerns         Walk 10 feet on uneven surface  activity   Assist Walk 10 feet on uneven  surfaces activity did not occur: Safety/medical concerns         Wheelchair     Assist Is the patient using a wheelchair?: Yes Type of Wheelchair: Manual    Wheelchair assist level: Dependent - Patient 0%      Wheelchair 50 feet with 2 turns activity    Assist        Assist Level: Dependent - Patient 0%   Wheelchair 150 feet activity     Assist      Assist Level: Dependent - Patient 0%   Blood pressure 103/61, pulse 61, temperature 98.2 F (36.8 C), resp. rate 17, SpO2 97%.  Medical Problem List and Plan: 1. 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  Antithrombotics: -DVT/anticoagulation:  Pharmaceutical: Lovenox              -antiplatelet therapy: ASA 3. Pain Management: Tylenol prn.  4. Mood/Behavior/Sleep: LCSW to follow for evaluation and support.              -antipsychotic agents: N/A 5. Neuropsych/cognition: This patient is capable of making decisions on his own behalf. 6. Skin/Wound Care: LCSW to follow for evaluation and support             --Gerhardt's butt cream to MASD on buttocks             --Diflucan for candida cruris.              --pressure relief measures to left elbow/left heel.  7. Fluids/Electrolytes/Nutrition:  encourage appropriate PO    Latest Ref Rng & Units 07/22/2023    5:28 AM 07/20/2023    2:26 AM 01/02/2016    3:51 PM  BMP  Glucose 70 - 99 mg/dL 308  657  846   BUN 8 - 23 mg/dL 19  16  34   Creatinine 0.61 - 1.24 mg/dL 9.62  9.52  8.41   Sodium 135 - 145 mmol/L 140  138  134   Potassium 3.5 - 5.1 mmol/L 4.0  3.8  5.5   Chloride 98 - 111 mmol/L 103  104  108   CO2 22 - 32 mmol/L 25  23  18    Calcium  8.9 - 10.3 mg/dL 8.9  8.5  9.1  4/28 Cr sl elevated--might be near baseline 8. T2DM: Hgb A1C- 8.2 (PTA) Was  taking Tresiba 54 units in am ( now on Semglee  22U ) and  Metformin 1000 mg--bid (restart Metformin at 500mg  BID)   CBG (last 3)  Recent Labs    07/22/23 1636 07/22/23 2041 07/23/23 0649  GLUCAP 184* 171* 131*   Increased Semglee  to 25 U on 4/27    -monitor for consistent pattern- am CBG in range but pm a bit elevated will not increase metformin due to creat 9.   HTN: Monitor BP TID--losartan 50 mg daily--was held for a few days to prevent hypoperfusion             -- parameters to hold prn sitting SBP< 120.  Vitals:   07/22/23 1933 07/23/23 0412  BP: (!) 127/58 103/61  Pulse: 96 61  Resp: 17 17  Temp: 98.5 F (36.9 C) 98.2 F (36.8 C)  SpO2: 100% 97%    10.  Myasthenia Gravis Mycophenolate  1500/1250mg  --Dr. Franklin Ito at [facility].              Prednisone  5 mg PTA--->has been weaned down to 2 mg daily, monitor for Ptosis and generalized proximal weakness 11. Hypothyroid: Stable on levothyroxine for supplement.  12.  Dyslipidemia: Was on pravastatin 80 mg daily 13. H/o Kidney stones: On Sodium bicarb BID to prevent stones.  14. Constipation: On miralax daily             --sorbitol added.   -last bm 4/26 15. H/o GERD: Has been off PPI.   LOS: 4 days A FACE TO FACE EVALUATION WAS PERFORMED  [name] 07/23/2023, 8:08 AM

## 2023-07-23 NOTE — Discharge Instructions (Addendum)
 Inpatient Rehab Discharge Instructions  [name]. Discharge date and time:  08/13/23  Activities/Precautions/ Functional Status: Activity: no lifting, driving, or strenuous exercise till cleared by MD Diet: cardiac diet and diabetic diet Wound Care: none needed   Functional status:  ___ No restrictions     ___ Walk up steps independently _X__ 24/7 supervision/assistance   ___ Walk up steps with assistance ___ Intermittent supervision/assistance  ___ Bathe/dress independently ___ Walk with walker     ___ Bathe/dress with assistance ___ Walk Independently    ___ Shower independently ___ Walk with assistance    _X__ Shower with assistance _X__ No alcohol     ___ Return to work/school ________  Special Instructions:  STROKE/TIA DISCHARGE INSTRUCTIONS SMOKING Cigarette smoking nearly doubles your risk of having a stroke & is the single most alterable risk factor  If you smoke or have smoked in the last 12 months, you are advised to quit smoking for your health. Most of the excess cardiovascular risk related to smoking disappears within a year of stopping. Ask you doctor about anti-smoking medications [place] Quit Line: 1-800-QUIT NOW Free Smoking Cessation Classes [phone]  CHOLESTEROL Know your levels; limit fat & cholesterol in your diet  Lipid Panel     Many patients benefit from treatment even if their cholesterol is at goal. Goal: Total Cholesterol (CHOL) less than 160 Goal:  Triglycerides (TRIG) less than 150 Goal:  HDL greater than 40 Goal:  LDL (LDLCALC) less than 100   BLOOD PRESSURE American Stroke Association blood pressure target is less that 120/80 mm/Hg  Your discharge blood pressure is:  BP: 134/69 Monitor your blood pressure Limit your salt and alcohol intake Many individuals will require more than one medication for high blood pressure  DIABETES (A1c is a blood sugar average for last 3 months) Goal HGBA1c is under 7% (HBGA1c is blood sugar average for  last 3 months)  Diabetes:     Lab Results  Component Value Date   HGBA1C 8.1 (H) 07/19/2023    Your HGBA1c can be lowered with medications, healthy diet, and exercise. Check your blood sugar as directed by your physician Call your physician if you experience unexplained or low blood sugars.  PHYSICAL ACTIVITY/REHABILITATION Goal is 30 minutes at least 4 days per week  Activity: No driving, Therapies: see above Return to work: to be decided after follow up Activity decreases your risk of heart attack and stroke and makes your heart stronger.  It helps control your weight and blood pressure; helps you relax and can improve your mood. Participate in a regular exercise program. Talk with your doctor about the best form of exercise for you (dancing, walking, swimming, cycling).  DIET/WEIGHT Goal is to maintain a healthy weight  Your discharge diet is:  Diet Order             Diet Carb Modified Fluid consistency: Thin; Room service appropriate? Yes  Diet effective now                   liquids Your height is:  5'8" Your current weight is: 282 Your Body Mass Index (BMI) is:  42.9 Following the type of diet specifically designed for you will help prevent another stroke. Your goal weight range is:  165 Your goal Body Mass Index (BMI) is 19-24. Healthy food habits can help reduce 3 risk factors for stroke:  High cholesterol, hypertension, and excess weight.  RESOURCES Stroke/Support Group:  Call [phone]   STROKE  EDUCATION PROVIDED/REVIEWED AND GIVEN TO PATIENT Stroke warning signs and symptoms How to activate emergency medical system (call 911). Medications prescribed at discharge. Need for follow-up after discharge. Personal risk factors for stroke. Pneumonia vaccine given:  Flu vaccine given:  My questions have been answered, the writing is legible, and I understand these instructions.  I will adhere to these goals & educational materials that have been provided to me after  my discharge from the hospital.     COMMUNITY REFERRALS UPON DISCHARGE:     Outpatient: PT      OT     ST              Agency: [facility]  Phone: [phone]              Appointment Date/Time: *Please expect follow-up within 7-10 business days to schedule your appointment. If you have not received follow-up, be sure to contact the site directly.*   Medical Equipment/Items Ordered:specialty wheelchair                                                 Agency/Supplier:[employer] # [phone]     My questions have been answered and I understand these instructions. I will adhere to these goals and the provided educational materials after my discharge from the hospital.  Patient/Caregiver Signature _______________________________ Date __________  Clinician Signature _______________________________________ Date __________  Please bring this form and your medication list with you to all your follow-up doctor's appointments.

## 2023-07-23 NOTE — Progress Notes (Signed)
 Speech Language Pathology Daily Session Note  Patient Details  Name: [name]. MRN: [id number] Date of Birth: 1950-11-05  Today's Date: 07/23/2023 SLP Individual Time: 1305-1401 SLP Individual Time Calculation (min): 56 min  Short Term Goals: Week 1: SLP Short Term Goal 1 (Week 1): Patient will solve complex financial problems with 90% accuracy with modI. SLP Short Term Goal 2 (Week 1): Patient will demonstrate recall of pertinent daily complex information with 90% accuracy with modI.  Skilled Therapeutic Interventions: SLP conducted skilled therapy session targeting cognitive retraining goals. SLP facilitated task targeting sustained attention to detail and left inattention. Patient benefited from min cues throughout to attend to left side of task. When fully attending to all parts of task, patient accurately executed task instructions with min assist. SLP then facilitated finance-based cognitive tasks of mild to moderate complexity. Patient benefited from supervision to min assist for least complex up to mod assist for most complex tasks. 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 Patient was left in room with call bell in reach and alarm set. SLP will continue to target goals per plan of care.        Pain Pain Assessment Pain Scale: 0-10 Pain Score: 0-No pain  Therapy/Group: Individual Therapy  Wayden Schwertner, M.A., CCC-SLP  Katanya Schlie A Roda Lauture 07/23/2023, 2:02 PM

## 2023-07-23 NOTE — Progress Notes (Signed)
 Physical Therapy Session Note  Patient Details  Name: [name]. MRN: [id number] Date of Birth: 24-Jan-1951  Today's Date: 07/23/2023 PT Individual Time: 3086-5784 PT Individual Time Calculation (min): 27 min   Short Term Goals: Week 1:  PT Short Term Goal 1 (Week 1): Pt will complete bed mobility mod assist PT Short Term Goal 2 (Week 1): Pt will complete sit<>stand transfers with LRAD with min assist PT Short Term Goal 3 (Week 1): Pt will complete bed<>chair transfers with LRAD with mod assist PT Short Term Goal 4 (Week 1): Pt will ambulate with LRAD 43' with mod assist  Skilled Therapeutic Interventions/Progress Updates:   Received pt sitting in WC, pt agreeable to PT treatment, and denied any pain during session - LUE hanging off side of WC and pt unaware. Session with emphasis on functional mobility/transfers, generalized strengthening and endurance, dynamic standing balance/coordination, and NMR. Pt transported to/from room in [facility] dependently for time management purposes. Stood x 3 trials with R handrail and mirror with mod A and worked on blocked Best boy with mod A for balance and blocking L knee from buckling - emphasis on quad strength and weight shifting to L. Pt then performed WC mobility ~72ft using RUE and BLE and supervision and increased time with emphasis on coordination and sequencing. Returned to room and concluded session with pt sitting in WC, needs within reach, and seatbelt alarm on. Half lap tray donned to support LUE and briefly reviewed seated LLE and LUE exercises to work on in between sessions.   Therapy Documentation Precautions:  Precautions Precautions: Fall Precaution/Restrictions Comments: falls, L hemi, mild L sided inattention with L lateral lean Restrictions Weight Bearing Restrictions Per Provider Order: No  Therapy/Group: Individual Therapy [name] Nena Bank PT, DPT 07/23/2023, 7:16 AM

## 2023-07-23 NOTE — Plan of Care (Signed)
  Problem: RH BOWEL ELIMINATION Goal: RH STG MANAGE BOWEL WITH ASSISTANCE Description: STG Manage Bowel with  mod I Assistance. Outcome: Progressing Goal: RH STG MANAGE BOWEL W/MEDICATION W/ASSISTANCE Description: STG Manage Bowel with Medication with  mod I Assistance. Outcome: Progressing   Problem: RH SAFETY Goal: RH STG ADHERE TO SAFETY PRECAUTIONS W/ASSISTANCE/DEVICE Description: STG Adhere to Safety Precautions With cues Assistance/Device. Outcome: Progressing   Problem: RH PAIN MANAGEMENT Goal: RH STG PAIN MANAGED AT OR BELOW PT'S PAIN GOAL Description: < 4 with prns Outcome: Progressing

## 2023-07-24 DIAGNOSIS — G7000 Myasthenia gravis without (acute) exacerbation: Secondary | ICD-10-CM | POA: Diagnosis not present

## 2023-07-24 DIAGNOSIS — E1169 Type 2 diabetes mellitus with other specified complication: Secondary | ICD-10-CM | POA: Diagnosis not present

## 2023-07-24 DIAGNOSIS — I639 Cerebral infarction, unspecified: Secondary | ICD-10-CM | POA: Diagnosis not present

## 2023-07-24 DIAGNOSIS — I63511 Cerebral infarction due to unspecified occlusion or stenosis of right middle cerebral artery: Secondary | ICD-10-CM | POA: Diagnosis not present

## 2023-07-24 LAB — GLUCOSE, CAPILLARY
Glucose-Capillary: 165 mg/dL — ABNORMAL HIGH (ref 70–99)
Glucose-Capillary: 226 mg/dL — ABNORMAL HIGH (ref 70–99)
Glucose-Capillary: 202 mg/dL — ABNORMAL HIGH (ref 70–99)
Glucose-Capillary: 232 mg/dL — ABNORMAL HIGH (ref 70–99)

## 2023-07-24 NOTE — Progress Notes (Signed)
 Speech Language Pathology Daily Session Note  Patient Details  Name: [name]. MRN: [id number] Date of Birth: 26-Jul-1950  Today's Date: 07/24/2023 SLP Individual Time: 1130-1200 SLP Individual Time Calculation (min): 30 min  Short Term Goals: Week 1: SLP Short Term Goal 1 (Week 1): Patient will solve complex financial problems with 90% accuracy with modI. SLP Short Term Goal 2 (Week 1): Patient will demonstrate recall of pertinent daily complex information with 90% accuracy with modI.  Skilled Therapeutic Interventions: Skilled therapy session focused on cognitive goals. SLP challenged patients problem solving and L attention skills through identification of medication mistakes task. Patient required modA to identify errors in BID pillbox and correct according to directions.SLP continued to target L attention by providing minA for patient to scan numbers and letters from L to R. Patient left in chair with alarm set and call bell in reach. Continue POC.   Pain None reported   Therapy/Group: Individual Therapy  Adriauna Campton M.A., CCC-SLP 07/24/2023, 7:37 AM

## 2023-07-24 NOTE — Patient Care Conference (Addendum)
 Inpatient RehabilitationTeam Conference and Plan of Care Update Date: 07/24/2023   Time: 10:13 AM    Patient Name: [name].      Medical Record Number: [id number]  Date of Birth: 1951/03/20 Sex: Male         Room/Bed: 4W22C/4W22C-01 Payor Info: Payor: Advertising copywriter MEDICARE / Plan: [facility] MEDICARE / Product Type: *No Product type* /    Admit Date/Time:  07/19/2023 12:07 PM  Primary Diagnosis:  Acute ischemic right MCA stroke [facility])  Hospital Problems: Principal Problem:   Acute ischemic right MCA stroke [facility])    Expected Discharge Date: Expected Discharge Date: 08/08/23  Team Members Present: Physician leading conference: Dr. Janeece Mechanic Social Worker Present: Norval Been, LCSWA Nurse Present: Forrestine Ike, RN PT Present: Oma Bias, PT;Dominic Randa Burton, PTA OT Present: Kenda Paula, OT SLP Present: Reggie Caper, SLP PPS Coordinator present : Jestine Moron, SLP     Current Status/Progress Goal Weekly Team Focus  Bowel/Bladder   Continent of Bladder and Bowel   Maintain independency of bladder and bladder while on IP Rehab   Assess toileting needs QS/PRN, address ant concerns    Swallow/Nutrition/ Hydration               ADL's   heavy max A of 2, developing shoulder movement, trace in finger tips. pt can actively flex elbow.   Min A overall   ADL training, LUE NMR,  mobility, pt education    Mobility   Bed mobility - mod/maxA, Transfer via STEDY (sit<>stand minA). Gait - maxA. Pt retropulses when scooting forward   CGA/minA transfers, supervision bed mobility, minA ambulation and modI WC propulsion  Barriers - Extensor tone L LE; Focus -  pt ed, gait, functional transfers, decrease tone L LE, dynamic standing, bed mobility    Communication                Safety/Cognition/ Behavioral Observations  min to mod assist for mildly complex problem solving tasks   modI   mildly complex problem solving tasks, esp. finance related     Pain      Chronic back pain; managed with tylenol    Pain < 4 with prns   Assess pain q shift and effectiveness of prn meds   Skin      N/a           Discharge Planning:  D/c to home with his wife. SW will confirm there are no barriersto discharge.   Team Discussion: Patient post right MCA CVA with good motor recovery.  Continue to be limited by left inattention, retropulsion and extensor tone.  Patient on target to meet rehab goals: yes, currently needs max assist for ADLs.  Needs CGA for sit - stand transfers. Working on [employer] with external rotation of hips.  Needs min- mod assist for problem solving; working financial activities. Goals for discharge set for min assist overall.  *See Care Plan and progress notes for long and short-term goals.   Revisions to Treatment Plan:  [place] cardiac monitor   Teaching Needs: Safety, medications, transfers, toileting, etc.   Current Barriers to Discharge: Decreased caregiver support and Home enviroment access/layout  Possible Resolutions to Barriers: Family education     Medical Summary Current Status: diabetes uncontrolled , morbid obesity , chronic low back pain , LUE> LLE paresis  Barriers to Discharge: Morbid Obesity;Uncontrolled Diabetes   Possible Resolutions to [employer] Focus: med management, insulin  administration   Continued Need for Acute Rehabilitation Level of  Care: The patient requires daily medical management by a physician with specialized training in physical medicine and rehabilitation for the following reasons: Direction of a multidisciplinary physical rehabilitation program to maximize functional independence : Yes Medical management of patient stability for increased activity during participation in an intensive rehabilitation regime.: Yes Analysis of laboratory values and/or radiology reports with any subsequent need for medication adjustment and/or medical intervention. : Yes   I  attest that I was present, lead the team conference, and concur with the assessment and plan of the team.   [name] 07/24/2023, 3:37 PM

## 2023-07-24 NOTE — Plan of Care (Signed)
  Problem: RH Attention Goal: LTG Patient will demonstrate this level of attention during functional activites (SLP) Description: LTG:  Patient will will demonstrate this level of attention during functional activites (SLP) Flowsheets (Taken 07/24/2023 1214) Patient will demonstrate during cognitive/linguistic activities the attention type of: Sustained LTG: Patient will demonstrate this level of attention during cognitive/linguistic activities with assistance of (SLP): Minimal Assistance - Patient > 75%

## 2023-07-24 NOTE — Progress Notes (Signed)
 Physical Therapy Session Note  Patient Details  Name: [name]. MRN: [id number] Date of Birth: 04/03/1950  Today's Date: 07/24/2023 PT Individual Time: 8295-6213; 1306 - 1334 PT Individual Time Calculation (min): 43 min; 28 min  Short Term Goals: Week 1:  PT Short Term Goal 1 (Week 1): Pt will complete bed mobility mod assist PT Short Term Goal 2 (Week 1): Pt will complete sit<>stand transfers with LRAD with min assist PT Short Term Goal 3 (Week 1): Pt will complete bed<>chair transfers with LRAD with mod assist PT Short Term Goal 4 (Week 1): Pt will ambulate with LRAD 23' with mod assist  SESSION 1 Skilled Therapeutic Interventions/Progress Updates: Patient sitting in WC on entrance to room. Patient alert and agreeable to PT session.   Patient reported no pain during session  Therapeutic Activity: Transfers: Pt performed sit<>stand pivot transfer from WC<edge of mat with modA + 2 for safety (tech provided CGA). Pt cued to weight shift accordingly (decreased stance time on L LE).  Neuromuscular Re-ed: NMR facilitated during session with focus on proprioceptive feedback L LE through WB. Pt ambulated 15' x 1 using B HHA with modA + 2. Pt demonstrated the following gait deviations with therapist providing the described cuing and facilitation for improvement:  - Decreased stance time on L LE, leading to decreased step length on R with cues to increase - Decreased weight shift with mod/maxA to facilitate - Decreased ability to maintain weight shift to center (posterior lean, slightly).  - Step to 2" step with R LE mod/heavy modA + 2 for overall standing balance. Pt performed x 5 steps with assistance required to facilitate weight shifting. No buckling noted. Mild hyperextension noted to be present with pt reporting feeling knee is "popped back," and that it does so when standing.   NMR performed for improvements in motor control and coordination, balance, sequencing, judgement,  and self confidence/ efficacy in performing all aspects of mobility at highest level of independence.   Patient sitting in WC at end of session with brakes locked, belt alarm set, and all needs within reach.  SESSION 2 Skilled Therapeutic Interventions/Progress Updates: Patient sitting in WC on entrance to room. Patient alert and agreeable to PT session.   Patient with no complaints of pain.   Therapeutic Activity: Transfers: Pt performed sit<>stand pivot transfer from WC<toilet in bathroom at end of session modA with R UE support on railing and VC for step pivot sequence. Pt stands to RW during gait trials with modA and cues for anterior weight shift and to place LE in safe position. Pt also with some retropulsion to stand.  Gait Training:  Pt ambulated less than 10' using RW with modA to maintain standing balance, prevent anterior LOB, and to weight shift accordingly. Pt demonstrates ability to advance B LE's through gait cycle with decreased stance on L still leading to decreased step length on R. Pt with WC follow both trials. 2nd trial with hemi-grip on L donned with pt reporting improvement in confidence to navigate RW safely (objectively observable as pt able to maintain all legs of RW on floor (mostly) throughout this trial vs 1st.  Patient sitting on toilet with hand off to NT at end of session.       Therapy Documentation Precautions:  Precautions Precautions: Fall Precaution/Restrictions Comments: falls, L hemi, mild L sided inattention with L lateral lean Restrictions Weight Bearing Restrictions Per Provider Order: No  Therapy/Group: Individual Therapy  Madalin Hughart PTA 07/24/2023, 3:36 PM

## 2023-07-24 NOTE — Progress Notes (Signed)
 Occupational Therapy Session Note  Patient Details  Name: [name]. MRN: [id number] Date of Birth: [birth year]  Today's Date: 07/24/2023 OT Individual Time: 1191-4782 OT Individual Time Calculation (min): 45 min    Short Term Goals: Week 1:  OT Short Term Goal 1 (Week 1): Pt will perform sit <> stands at LRAD with MOD A OT Short Term Goal 2 (Week 1): Pt will perform toilet transfer with LRAD and MOD of 1 OT Short Term Goal 3 (Week 1): Pt will improve MMT/ROM of LUE to 2/5 OT Short Term Goal 4 (Week 1): Pt will utilize hemitechniques for dressing consistently with MIN cues  Skilled Therapeutic Interventions/Progress Updates:    Pt received in recliner with wife in the room. Discussed how he could take a shower this afternoon in his longer OT session.   Pt agreeable to working on LUE/LLE NMR with use of the stedy as a support.  Pt initially stood up in stedy with only light CGA but pulled himself upwards with a thrusting motion leading with his pelvis.  Had pt trial sit to stands several more times from recliner and then from elevated bed in stedy with a focus forward weight shift to train muscles to move in the way they will need to for sit to stands without stedy.   Pt did well following cues able to achieve a forward weight shift.   In standing in stedy,  pt worked on modified pushups on bar of stedy to promote grasp and tricep strength.  With sitting on perch of stedy,  pt worked on actively moving hand on bar for grasp and sliding hand on bar.    In sitting on EOB pt worked on sitting balance. Pt tends to lean back and needs cues to keep shoulders over hips.   Cues to exhale on exertion. Sitting EOB,  A/arom of LUE with lifting arm to simulate bathing tasks and hand over hand guiding of left arm to reach to R shoulder and arm to simulate arm motion needed for bathing.   Pt can now lift arm to 80-90 degrees flexion vs 45 yesterday.!  Pt opted to sit in w/c.  Retrieved cushion  for w/c.   Pt used stedy lift from bed to w/c.  Positioned lap tray with pillow to support LUE.   Belt alarm on and all needs met.   Therapy Documentation Precautions:  Precautions Precautions: Fall Precaution/Restrictions Comments: falls, L hemi, mild L sided inattention with L lateral lean Restrictions Weight Bearing Restrictions Per Provider Order: No   Pain: Pain Assessment Pain Score: 0-No pain    Therapy/Group: Individual Therapy  Dondra Rhett 07/24/2023, 10:28 AM

## 2023-07-24 NOTE — Progress Notes (Signed)
 Occupational Therapy Session Note  Patient Details  Name: [name]. MRN: [id number] Date of Birth: November 13, 1950  Today's Date: 07/24/2023 OT Individual Time: 1417-1530 OT Individual Time Calculation (min): 73 min    Short Term Goals: Week 1:  OT Short Term Goal 1 (Week 1): Pt will perform sit <> stands at LRAD with MOD A OT Short Term Goal 2 (Week 1): Pt will perform toilet transfer with LRAD and MOD of 1 OT Short Term Goal 3 (Week 1): Pt will improve MMT/ROM of LUE to 2/5 OT Short Term Goal 4 (Week 1): Pt will utilize hemitechniques for dressing consistently with MIN cues  Skilled Therapeutic Interventions/Progress Updates:     Pt received sitting up in recliner presenting to be in good spirits receptive to skilled OT session reporting 0/10 pain- OT offering intermittent rest breaks, repositioning, and therapeutic support to optimize participation in therapy session. Pt requesting to take shower this PM.   Mobility: Utilized STEDY for transport <> TTB to facilitate increased opportunities to work on sit<>stands and anterior weight shifting. Pt able to complete sit<>stand x4 trials during session with min A +2 with mac verbal cues required for technique, foot positioning, and L UE positioning. Pt able to maintain perched sitting position during transfer with CGA, utilizing R UE to maintain L UE position on STEDY grab bar.   BADLs:  -Pt completed U/LB bathing in seated position on TTB with education provided on hemi techniques for washing R underarm, long handled sponge use, and technique for completing lateral leans to wash buttocks. Pt able to complete UB bathing with min A overall with assistance required for fully wash R UE- OT provided HOH A to L UE during bathing tasks to support improved motor learning. LB bathing completed mod A with assistance required for washing buttocks and lower B LEs.  -Educated Pt on hemi-techniques with demonstration and verbal instructions provided.  Pt doffed shirt using hemi-technique prior to shower with supervision and pants total d/t balance deficits. U/LB dressing completed sitting in recliner following shower with max verbal cues and mod A required to donn shirt using hemi-technique. Spent time working on alternative techniques for [employer] dressing- d/t Pt's body habitus he had increased challenges leaning forward to weave L LE, however he was more successful with weaving B LE using reacher. Pt stood using STEDY min A and pants were brought to waist with max A to allow Pt to focus on balance and trunk positioning.   Education:  -Provided education on energy conservation techniques, PLB'ing, and energy conservation techniques with Pt receptive to education and applying techniques during session with min verbal cues.   Pt was left resting in recliner with call bell in reach and all needs met.    Therapy Documentation Precautions:  Precautions Precautions: Fall Precaution/Restrictions Comments: falls, L hemi, mild L sided inattention with L lateral lean Restrictions Weight Bearing Restrictions Per Provider Order: No   Therapy/Group: Individual Therapy  Geoffery Kiel 07/24/2023, 3:37 PM

## 2023-07-24 NOTE — Progress Notes (Signed)
 Occupational Therapy Session Note  Patient Details  Name: [name]. MRN: [id number] Date of Birth: 01-04-1951  Today's Date: 07/25/2023 OT Individual Time: 4098-1191 OT Individual Time Calculation (min): 59 min    Short Term Goals: Week 1:  OT Short Term Goal 1 (Week 1): Pt will perform sit <> stands at LRAD with MOD A OT Short Term Goal 2 (Week 1): Pt will perform toilet transfer with LRAD and MOD of 1 OT Short Term Goal 3 (Week 1): Pt will improve MMT/ROM of LUE to 2/5 OT Short Term Goal 4 (Week 1): Pt will utilize hemitechniques for dressing consistently with MIN cues  Skilled Therapeutic Interventions/Progress Updates:    Patient agreeable to participate in OT session. Reports 0/10 pain level.   Patient participated in skilled OT session focusing on cognitive skills development focusing on visual processing and problem solving while participating in pegs and pattern activity. Pt provided with simple pegboard design to copy on small pegboard. Pt required extensive amount of time to complete pattern while requiring min VC to assist with checking accuracy.    Therapy Documentation Precautions:  Precautions Precautions: Fall Precaution/Restrictions Comments: falls, L hemi, mild L sided inattention with L lateral lean Restrictions Weight Bearing Restrictions Per Provider Order: No  Therapy/Group: Individual Therapy  Carollee Circle, OTR/L,CBIS  Supplemental OT - [facility] and [facility] Secure Chat Preferred   07/25/2023, 8:05 AM

## 2023-07-24 NOTE — Progress Notes (Signed)
 PROGRESS NOTE   Subjective/Complaints:  Back pain , has this at home as well , only mild complaints  OT used E stim LUE No safety issues or impulsivity noted in OT  ROS: Patient denies CP, SOB, N/V/D Objective:   No results found. Recent Labs    07/22/23 0528  WBC 9.6  HGB 12.3*  HCT 38.3*  PLT 361   Recent Labs    07/22/23 0528  NA 140  K 4.0  CL 103  CO2 25  GLUCOSE 168*  BUN 19  CREATININE 1.35*  CALCIUM  8.9    Intake/Output Summary (Last 24 hours) at 07/24/2023 0900 Last data filed at 07/24/2023 0404 Gross per 24 hour  Intake 118 ml  Output 850 ml  Net -732 ml        Physical Exam: Vital Signs Blood pressure 131/64, pulse (!) 57, temperature 98.3 F (36.8 C), resp. rate 16, SpO2 98%.   General: No acute distress Mood and affect are appropriate Heart: Regular rate and rhythm no rubs murmurs or extra sounds Lungs: Clear to auscultation, breathing unlabored, no rales or wheezes Abdomen: Positive bowel sounds, soft nontender to palpation, nondistended Extremities: No clubbing, cyanosis, or edema Skin: No evidence of breakdown, no evidence of rash  Skin: No evidence of breakdown, no evidence of rash Neurologic: Cranial nerves II through XII intact, motor strength is 5/5 in right and 2-/5 left deltoid, bicep, tricep, grip, hip flexor, knee extensors, ankle dorsiflexor and plantar flexor. Flexor tone in left fingers/wrist. DTR's brisk. Toes up Sensory exam normal sensation to light touch  in bilateral upper and lower extremities Cerebellar exam remains limited by weakness on the left side  Musculoskeletal: Full passive  range of motion in all 4 extremities. No joint swelling   Assessment/Plan: 1. Functional deficits which require 3+ hours per day of interdisciplinary therapy in a comprehensive inpatient rehab setting. Physiatrist is providing close team supervision and 24 hour management of active  medical problems listed below. Physiatrist and rehab team continue to assess barriers to discharge/monitor patient progress toward functional and medical goals  Care Tool:  Bathing    Body parts bathed by patient: Left arm, Chest, Abdomen, Right upper leg, Left upper leg, Face   Body parts bathed by helper: Right arm, Front perineal area, Buttocks, Right lower leg, Left lower leg     Bathing assist Assist Level: Maximal Assistance - Patient 24 - 49%     Upper Body Dressing/Undressing Upper body dressing   What is the patient wearing?: Pull over shirt    Upper body assist Assist Level: Maximal Assistance - Patient 25 - 49%    Lower Body Dressing/Undressing Lower body dressing      What is the patient wearing?: Pants     Lower body assist Assist for lower body dressing: Dependent - Patient 0%     Toileting Toileting    Toileting assist Assist for toileting: Total Assistance - Patient < 25%     Transfers Chair/bed transfer  Transfers assist     Chair/bed transfer assist level: 2 Helpers (MAX A +2 squat pivot)     Locomotion Ambulation   Ambulation assist   Ambulation activity did  not occur: Safety/medical concerns (ambulated 29' with RHR in hallway mod assist and +2 WC follow)  Assist level: Maximal Assistance - Patient 25 - 49% Assistive device:  (hand rail as well as HR and under L shoulder.) Max distance: 30   Walk 10 feet activity   Assist  Walk 10 feet activity did not occur: Safety/medical concerns  Assist level: Maximal Assistance - Patient 25 - 49% Assistive device:  (hand rail as well as HR and under L shoulder.)   Walk 50 feet activity   Assist Walk 50 feet with 2 turns activity did not occur: Safety/medical concerns         Walk 150 feet activity   Assist Walk 150 feet activity did not occur: Safety/medical concerns         Walk 10 feet on uneven surface  activity   Assist Walk 10 feet on uneven surfaces activity did not  occur: Safety/medical concerns         Wheelchair     Assist Is the patient using a wheelchair?: Yes Type of Wheelchair: Manual    Wheelchair assist level: Dependent - Patient 0%      Wheelchair 50 feet with 2 turns activity    Assist        Assist Level: Dependent - Patient 0%   Wheelchair 150 feet activity     Assist      Assist Level: Dependent - Patient 0%   Blood pressure 131/64, pulse (!) 57, temperature 98.3 F (36.8 C), resp. rate 16, SpO2 98%.  Medical Problem List and Plan: 1. 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 Formulized individual treatment plan based on medical history, underlying problem and comorbidities.    -Continue CIR therapies including PT, OT, and SLP  2.  Antithrombotics: -DVT/anticoagulation:  Pharmaceutical: Lovenox              -antiplatelet therapy: ASA 3. Pain Management: Tylenol prn.  4. Mood/Behavior/Sleep: LCSW to follow for evaluation and support.              -antipsychotic agents: N/A 5. Neuropsych/cognition: This patient is capable of making decisions on his own behalf. 6. Skin/Wound Care: LCSW to follow for evaluation and support             --Gerhardt's butt cream to MASD on buttocks             --Diflucan for candida cruris.              --pressure relief measures to left elbow/left heel.  7. Fluids/Electrolytes/Nutrition:  encourage appropriate PO    Latest Ref Rng & Units 07/22/2023    5:28 AM 07/20/2023    2:26 AM 01/02/2016    3:51 PM  BMP  Glucose 70 - 99 mg/dL 161  096  045   BUN 8 - 23 mg/dL 19  16  34   Creatinine 0.61 - 1.24 mg/dL 4.09  8.11  9.14   Sodium 135 - 145 mmol/L 140  138  134   Potassium 3.5 - 5.1  mmol/L 4.0  3.8  5.5  Chloride 98 - 111 mmol/L 103  104  108   CO2 22 - 32 mmol/L 25  23  18    Calcium  8.9 - 10.3 mg/dL 8.9  8.5  9.1      4/78 Cr sl elevated--might be near baseline 8. T2DM: Hgb A1C- 8.2 (PTA) Was taking Tresiba 54 units in am ( now on Semglee  22U ) and  Metformin 1000 mg--bid (restart Metformin at 500mg  BID)   CBG (last 3)  Recent Labs    07/23/23 1610 07/23/23 2055 07/24/23 0627  GLUCAP 170* 204* 165*   Increased Semglee  to 25 U on 4/27    -monitor for consistent pattern- am CBG in range but pm a bit elevated will not increase metformin due to creat 9.   HTN: Monitor BP TID--losartan 50 mg daily--was held for a few days to prevent hypoperfusion             -- parameters to hold prn sitting SBP< 120.  Vitals:   07/23/23 1945 07/24/23 0538  BP: 133/73 131/64  Pulse: 76 (!) 57  Resp: 16 16  Temp: 98.3 F (36.8 C) 98.3 F (36.8 C)  SpO2: (!) 83% 98%    10.  Myasthenia Gravis Mycophenolate  1500/1250mg  --Dr. Franklin Ito at [facility].              Prednisone  5 mg PTA--->has been weaned down to 2 mg daily, monitor for Ptosis and generalized proximal weakness 11. Hypothyroid: Stable on levothyroxine for supplement.  12.  Dyslipidemia: Was on pravastatin 80 mg daily 13. H/o Kidney stones: On Sodium bicarb BID to prevent stones.  14. Constipation: On miralax daily             --sorbitol added.   -last bm 4/26 15. H/o GERD: Has been off PPI.   LOS: 5 days A FACE TO FACE EVALUATION WAS PERFORMED  Genetta Kenning 07/24/2023, 9:00 AM

## 2023-07-24 NOTE — Progress Notes (Signed)
 Patient ID: Douglas Cobia., male   DOB: [birth year], 73 y.o.   MRN: [id number]  SW met with pt and pt family in room to provide updates from team conference, and d/c date 5/15. He is aware SW will follow-up with his wife.   [name], MSW, LCSW Office: [phone] Cell: [phone] Fax: [phone]

## 2023-07-25 DIAGNOSIS — I63511 Cerebral infarction due to unspecified occlusion or stenosis of right middle cerebral artery: Secondary | ICD-10-CM | POA: Diagnosis not present

## 2023-07-25 DIAGNOSIS — I639 Cerebral infarction, unspecified: Secondary | ICD-10-CM | POA: Diagnosis not present

## 2023-07-25 DIAGNOSIS — G7000 Myasthenia gravis without (acute) exacerbation: Secondary | ICD-10-CM | POA: Diagnosis not present

## 2023-07-25 DIAGNOSIS — E1169 Type 2 diabetes mellitus with other specified complication: Secondary | ICD-10-CM | POA: Diagnosis not present

## 2023-07-25 LAB — CBC
HCT: 34.8 % — ABNORMAL LOW (ref 39.0–52.0)
Hemoglobin: 11.3 g/dL — ABNORMAL LOW (ref 13.0–17.0)
MCH: 30.3 pg (ref 26.0–34.0)
MCHC: 32.5 g/dL (ref 30.0–36.0)
MCV: 93.3 fL (ref 80.0–100.0)
Platelets: 339 10*3/uL (ref 150–400)
RBC: 3.73 MIL/uL — ABNORMAL LOW (ref 4.22–5.81)
RDW: 12.8 % (ref 11.5–15.5)
WBC: 7.4 10*3/uL (ref 4.0–10.5)
nRBC: 0 % (ref 0.0–0.2)

## 2023-07-25 LAB — BASIC METABOLIC PANEL WITH GFR
Anion gap: 14 (ref 5–15)
BUN: 20 mg/dL (ref 8–23)
CO2: 23 mmol/L (ref 22–32)
Calcium: 8.8 mg/dL — ABNORMAL LOW (ref 8.9–10.3)
Chloride: 106 mmol/L (ref 98–111)
Creatinine, Ser: 1.21 mg/dL (ref 0.61–1.24)
GFR, Estimated: >60 mL/min (ref >60)
Glucose, Bld: 98 mg/dL (ref 70–99)
Potassium: 3.7 mmol/L (ref 3.5–5.1)
Sodium: 143 mmol/L (ref 135–145)

## 2023-07-25 LAB — GLUCOSE, CAPILLARY
Glucose-Capillary: 119 mg/dL — ABNORMAL HIGH (ref 70–99)
Glucose-Capillary: 164 mg/dL — ABNORMAL HIGH (ref 70–99)
Glucose-Capillary: 187 mg/dL — ABNORMAL HIGH (ref 70–99)
Glucose-Capillary: 173 mg/dL — ABNORMAL HIGH (ref 70–99)

## 2023-07-25 MED ORDER — INSULIN ASPART 100 UNIT/ML IJ SOLN
3.0000 [IU] | Freq: Three times a day (TID) | INTRAMUSCULAR | Status: DC
  Administered 2023-07-25 – 2023-07-28 (×9): 3 [IU] via SUBCUTANEOUS

## 2023-07-25 NOTE — Progress Notes (Addendum)
 PROGRESS NOTE   Subjective/Complaints:  No issues overnite , pt aware of tent d/c date   ROS: Patient denies CP, SOB, N/V/D Objective:   No results found. No results for input(s): "WBC", "HGB", "HCT", "PLT" in the last 72 hours.  No results for input(s): "NA", "K", "CL", "CO2", "GLUCOSE", "BUN", "CREATININE", "CALCIUM " in the last 72 hours.   Intake/Output Summary (Last 24 hours) at 07/25/2023 0754 Last data filed at 07/25/2023 0300 Gross per 24 hour  Intake 480 ml  Output 300 ml  Net 180 ml        Physical Exam: Vital Signs Blood pressure 112/73, pulse 66, temperature 98.2 F (36.8 C), resp. rate 16, SpO2 99%.   General: No acute distress Mood and affect are appropriate Heart: Regular rate and rhythm no rubs murmurs or extra sounds Lungs: Clear to auscultation, breathing unlabored, no rales or wheezes Abdomen: Positive bowel sounds, soft nontender to palpation, nondistended Extremities: No clubbing, cyanosis, or edema Skin: No evidence of breakdown, no evidence of rash  Neurologic: Cranial nerves II through XII intact, motor strength is 5/5 in right and 2-/5 left deltoid, bicep, tricep, grip, hip flexor, knee extensors, ankle dorsiflexor and plantar flexor. Flexor tone in left fingers/wrist. DTR's brisk. Toes up Sensory exam normal sensation to light touch  in bilateral upper and lower extremities Cerebellar exam remains limited by weakness on the left side  Musculoskeletal: Full passive  range of motion in all 4 extremities. No joint swelling   Assessment/Plan: 1. Functional deficits which require 3+ hours per day of interdisciplinary therapy in a comprehensive inpatient rehab setting. Physiatrist is providing close team supervision and 24 hour management of active medical problems listed below. Physiatrist and rehab team continue to assess barriers to discharge/monitor patient progress toward functional and  medical goals  Care Tool:  Bathing    Body parts bathed by patient: Left arm, Chest, Abdomen, Right upper leg, Left upper leg, Face   Body parts bathed by helper: Right arm, Front perineal area, Buttocks, Right lower leg, Left lower leg     Bathing assist Assist Level: Maximal Assistance - Patient 24 - 49%     Upper Body Dressing/Undressing Upper body dressing   What is the patient wearing?: Pull over shirt    Upper body assist Assist Level: Maximal Assistance - Patient 25 - 49%    Lower Body Dressing/Undressing Lower body dressing      What is the patient wearing?: Pants     Lower body assist Assist for lower body dressing: Dependent - Patient 0%     Toileting Toileting    Toileting assist Assist for toileting: Total Assistance - Patient < 25%     Transfers Chair/bed transfer  Transfers assist     Chair/bed transfer assist level: 2 Helpers (MAX A +2 squat pivot)     Locomotion Ambulation   Ambulation assist   Ambulation activity did not occur: Safety/medical concerns (ambulated 25' with RHR in hallway mod assist and +2 WC follow)  Assist level: Maximal Assistance - Patient 25 - 49% Assistive device:  (hand rail as well as HR and under L shoulder.) Max distance: 30   Walk 10 feet activity  Assist  Walk 10 feet activity did not occur: Safety/medical concerns  Assist level: Maximal Assistance - Patient 25 - 49% Assistive device:  (hand rail as well as HR and under L shoulder.)   Walk 50 feet activity   Assist Walk 50 feet with 2 turns activity did not occur: Safety/medical concerns         Walk 150 feet activity   Assist Walk 150 feet activity did not occur: Safety/medical concerns         Walk 10 feet on uneven surface  activity   Assist Walk 10 feet on uneven surfaces activity did not occur: Safety/medical concerns         Wheelchair     Assist Is the patient using a wheelchair?: Yes Type of Wheelchair: Manual     Wheelchair assist level: Dependent - Patient 0%      Wheelchair 50 feet with 2 turns activity    Assist        Assist Level: Dependent - Patient 0%   Wheelchair 150 feet activity     Assist      Assist Level: Dependent - Patient 0%   Blood pressure 112/73, pulse 66, temperature 98.2 F (36.8 C), resp. rate 16, SpO2 99%.  Medical Problem List and Plan: 1. 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  Antithrombotics: -DVT/anticoagulation:  Pharmaceutical: Lovenox              -antiplatelet therapy: ASA 3. Pain Management: Tylenol  prn.  4. Mood/Behavior/Sleep: LCSW to follow for evaluation and support.              -antipsychotic agents: N/A 5. Neuropsych/cognition: This patient is capable of making decisions on his own behalf. 6. Skin/Wound Care: LCSW to follow for evaluation and support             --Gerhardt's butt cream to MASD on buttocks             --Diflucan for candida cruris.              --pressure relief measures to left elbow/left heel.  7. Fluids/Electrolytes/Nutrition:  encourage appropriate PO    Latest Ref Rng & Units 07/22/2023    5:28 AM 07/20/2023    2:26 AM 01/02/2016    3:51 PM  BMP  Glucose 70 - 99 mg/dL 657  846  962   BUN 8 - 23 mg/dL 19  16  34   Creatinine 0.61 - 1.24 mg/dL 9.52  8.41  3.24   Sodium 135 - 145 mmol/L 140  138  134   Potassium 3.5 - 5.1 mmol/L 4.0  3.8  5.5   Chloride 98 - 111 mmol/L 103  104  108   CO2 22 - 32 mmol/L 25  23  18    Calcium  8.9 - 10.3 mg/dL 8.9  8.5  9.1      4/01 Cr sl elevated--might be near baseline 8. T2DM: Hgb A1C- 8.2 (PTA) Was taking Tresiba 54 units in am ( now on Semglee  22U ) and  Metformin  1000 mg--bid (restart Metformin  at 500mg  BID)   CBG (last 3)  Recent Labs  07/24/23 1654 07/24/23 2122  07/25/23 0620  GLUCAP 202* 232* 119*   Increased Semglee  to 25 U on 4/27   Add Novalog 3U TID WC  -monitor for consistent pattern- am CBG in range but pm a bit elevated will not increase metformin  due to creat 9.   HTN: Monitor BP TID--losartan  50 mg daily--was held for a few days to prevent hypoperfusion             -- parameters to hold prn sitting SBP< 120.  Vitals:   07/24/23 2038 07/25/23 0624  BP: 124/66 112/73  Pulse: 69 66  Resp: 18 16  Temp: 98.5 F (36.9 C) 98.2 F (36.8 C)  SpO2: 97% 99%    10.  Myasthenia Gravis Mycophenolate  1500/1250mg  --Dr. Franklin Ito at [facility].              Prednisone  5 mg PTA--->has been weaned down to 2 mg daily, monitor for Ptosis and generalized proximal weakness 11. Hypothyroid: Stable on levothyroxine  for supplement.  12.  Dyslipidemia: Was on pravastatin 80 mg daily 13. H/o Kidney stones: On Sodium bicarb BID to prevent stones.  14. Constipation: On miralax  daily             --sorbitol  added.   -last bm 4/26 15. H/o GERD: Has been off PPI.   LOS: 6 days A FACE TO FACE EVALUATION WAS PERFORMED  Genetta Kenning 07/25/2023, 7:54 AM

## 2023-07-25 NOTE — Progress Notes (Signed)
 Speech Language Pathology Daily Session Note  Patient Details  Name: [name]. MRN: [id number] Date of Birth: 06-Nov-1950  Today's Date: 07/25/2023 SLP Individual Time: 1000-1030 SLP Individual Time Calculation (min): 30 min  Short Term Goals: Week 1: SLP Short Term Goal 1 (Week 1): Patient will solve complex financial problems with 90% accuracy with modI. SLP Short Term Goal 2 (Week 1): Patient will demonstrate recall of pertinent daily complex information with 90% accuracy with modI.  Skilled Therapeutic Interventions: Skilled therapy session focused on cognitive goals. SLP facilitated session by providing min-modA for patient to complete check balancing task. Patient was prompted to complete addition/subtraction according to verbalized directions to calculate remaining check balance. Patient required reminders to check work and line numbers up when completing mathematic equation. Patient reports math was his strong suit before admission and reports feeling frustrated with today's performance. SLP provided encouragement as able. Patient left in chair with alarm set and call bell in reach. Continue POC.   Pain None reported   Therapy/Group: Individual Therapy  Louna Rothgeb M.A., CCC-SLP 07/25/2023, 7:34 AM

## 2023-07-25 NOTE — Progress Notes (Signed)
 Physical Therapy Session Note  Patient Details  Name: [name]. MRN: [id number] Date of Birth: Aug 09, 1950  Today's Date: 07/25/2023 PT Individual Time: 0806-0906 PT Individual Time Calculation (min): 60 min   Short Term Goals: Week 1:  PT Short Term Goal 1 (Week 1): Pt will complete bed mobility mod assist PT Short Term Goal 2 (Week 1): Pt will complete sit<>stand transfers with LRAD with min assist PT Short Term Goal 3 (Week 1): Pt will complete bed<>chair transfers with LRAD with mod assist PT Short Term Goal 4 (Week 1): Pt will ambulate with LRAD 47' with mod assist  Skilled Therapeutic Interventions/Progress Updates: Patient sitting in recliner with wife present on entrance to room. Patient alert and agreeable to PT session.   Patient with no complaints of pain. Pt wife updated on pt's progress, and what PT has consisted of lately. Pt wife with questions regarding pt's chances of functional ambulation at d/c. PTA discussed that pt is making great progress towards goals, and that we will do all that we can to assist pt during stay. Pt and pt wife with questions regarding what can be done while sitting in room to assist with L LE control. Pt instructed to perform LAQ sitting in WC with heavy focus on eccentric control in order to improve stance positioning.  Therapeutic Activity: Transfers: Pt performed sit<>stand pivot transfers throughout session with RW and with modA. Provided VC for pivoting sequence and weight shift (manual assistance required especially to pivot with non-paretic side leading vs paretic side as pt requires slightly less assistance going towards L).  Neuromuscular Re-ed: NMR facilitated during session with focus on weight shifting, neuromuscular control/connection, proprioceptive feedback. - Static standing in RW with L hemi-grip with cues to adjust B LE's to neutral stance. Pt with moments of modA to CGA to maintain standing balance. Pt with L knee locked in  extension with decreased ability to maintain soft bend (pt educated on matter). Pt provided with seated rest break. - Pt mini squats (very mini) in RW with pt requiring block of L knee. Pt cued to control eccentric to avoid knee locking into extension. Pt required modA to prevent locking. +2 provided for safety with tech providing CGA for safety. Pt with decreased eccentric control of quadriceps with education provided of what future sessions will focus on  NMR performed for improvements in motor control and coordination, balance, sequencing, judgement, and self confidence/ efficacy in performing all aspects of mobility at highest level of independence.   Patient sitting in WC at end of session with brakes locked, wife present, belt alarm set, and all needs within reach.      Therapy Documentation Precautions:  Precautions Precautions: Fall Precaution/Restrictions Comments: falls, L hemi, mild L sided inattention with L lateral lean Restrictions Weight Bearing Restrictions Per Provider Order: No  Therapy/Group: Individual Therapy  Beautiful Pensyl PTA 07/25/2023, 12:12 PM

## 2023-07-25 NOTE — Progress Notes (Signed)
 Occupational Therapy Session Note  Patient Details  Name: [name]. MRN: [id number] Date of Birth: Nov 26, 1950  Today's Date: 07/25/2023 OT Individual Time: 4742-5956 OT Individual Time Calculation (min): 45 min    Short Term Goals: Week 1:  OT Short Term Goal 1 (Week 1): Pt will perform sit <> stands at LRAD with MOD A OT Short Term Goal 2 (Week 1): Pt will perform toilet transfer with LRAD and MOD of 1 OT Short Term Goal 3 (Week 1): Pt will improve MMT/ROM of LUE to 2/5 OT Short Term Goal 4 (Week 1): Pt will utilize hemitechniques for dressing consistently with MIN cues  Skilled Therapeutic Interventions/Progress Updates:    Skilled OT interventin with focus on LUE PROM/AAROM/AROM to increase function and independence with bADLs. Pt transitioned to main gym. Pt unable to maintain upright sitting position without support 2/2 body habitus. Pt with active shoulder movement and weak grasp. Focus on AAROM for shoulder flexion and abduction with emphasis on form and muscle group isolation. Pt returned to room and remained in w/c. Belt alarm activated. All needs within reach.   Therapy Documentation Precautions:  Precautions Precautions: Fall Precaution/Restrictions Comments: falls, L hemi, mild L sided inattention with L lateral lean Restrictions Weight Bearing Restrictions Per Provider Order: No   Pain:  Pt denies pain this morning.   Therapy/Group: Individual Therapy  [name] 07/25/2023, 12:12 PM

## 2023-07-25 NOTE — Plan of Care (Signed)
 Problem: Education: Goal: Knowledge of General Education information will improve Description: Including pain rating scale, medication(s)/side effects and non-pharmacologic comfort measures Outcome: Progressing Outcome: Progressing   Problem: Health Behavior/Discharge Planning: Goal: Ability to manage health-related needs will improve Outcome: Progressing Outcome: Progressing   Problem: Clinical Measurements: Goal: Ability to maintain clinical measurements within normal limits will improve Outcome: Progressing Outcome: Progressing  Goal: Will remain free from infection Outcome: Progressing Outcome: Progressing  Goal: Diagnostic test results will improve Outcome: Progressing Outcome: Progressing  Goal: Respiratory complications will improve Outcome: Progressing Outcome: Progressing  Goal: Cardiovascular complication will be avoided Outcome: Progressing Outcome: Progressing   Problem: Activity: Goal: Risk for activity intolerance will decrease 07/19/2023 1010 by Diamond Formica, RN Outcome: Progressing 07/19/2023 1010 by Diamond Formica, RN Outcome: Progressing   Problem: Nutrition: Goal: Adequate nutrition will be maintained Outcome: Progressing Outcome: Progressing   Problem: Coping: Goal: Level of anxiety will decrease Outcome: Progressing Outcome: Progressing   Problem: Elimination: Goal: Will not experience complications related to bowel motility Outcome: Progressing Outcome: Progressing  Goal: Will not experience complications related to urinary retention Outcome: Progressing Outcome: Progressing   Problem: Pain Managment: Goal: General experience of comfort will improve and/or be controlled Outcome: Progressing Outcome: Progressing   Problem: Safety: Goal: Ability to remain free from injury will improve Outcome: Progressing Outcome: Progressing   Problem: Education: Goal: Knowledge of disease or condition will improve Outcome: Progressing Outcome:  Progressing  Goal: Knowledge of secondary prevention will improve (MUST DOCUMENT ALL) Outcome: Progressing Outcome: Progressing  Goal: Knowledge of patient specific risk factors will improve (DELETE if not current risk factor) Outcome: Progressing Outcome: Progressing   Problem: Ischemic Stroke/TIA Tissue Perfusion: Goal: Complications of ischemic stroke/TIA will be minimized Outcome: Progressing Outcome: Progressing   Problem: Coping: Goal: Will identify appropriate support needs Outcome: Progressing Outcome: Progressing   Problem: Self-Care: Goal: Ability to participate in self-care as condition permits will improve Outcome: Progressing Outcome: Progressing  Goal: Verbalization of feelings and concerns over difficulty with self-care will improve Outcome: Progressing Outcome: Progressing

## 2023-07-26 DIAGNOSIS — G7000 Myasthenia gravis without (acute) exacerbation: Secondary | ICD-10-CM | POA: Diagnosis not present

## 2023-07-26 DIAGNOSIS — I63511 Cerebral infarction due to unspecified occlusion or stenosis of right middle cerebral artery: Secondary | ICD-10-CM | POA: Diagnosis not present

## 2023-07-26 DIAGNOSIS — E1169 Type 2 diabetes mellitus with other specified complication: Secondary | ICD-10-CM | POA: Diagnosis not present

## 2023-07-26 DIAGNOSIS — I639 Cerebral infarction, unspecified: Secondary | ICD-10-CM | POA: Diagnosis not present

## 2023-07-26 LAB — GLUCOSE, CAPILLARY
Glucose-Capillary: 113 mg/dL — ABNORMAL HIGH (ref 70–99)
Glucose-Capillary: 182 mg/dL — ABNORMAL HIGH (ref 70–99)
Glucose-Capillary: 172 mg/dL — ABNORMAL HIGH (ref 70–99)
Glucose-Capillary: 223 mg/dL — ABNORMAL HIGH (ref 70–99)

## 2023-07-26 NOTE — Progress Notes (Signed)
 Physical Therapy Session Note  Patient Details  Name: [name]. MRN: [id number] Date of Birth: 1950/09/26  Today's Date: 07/26/2023 PT Individual Time: 1022-1132 PT Individual Time Calculation (min): 70 min   Short Term Goals: Week 1:  PT Short Term Goal 1 (Week 1): Pt will complete bed mobility mod assist PT Short Term Goal 2 (Week 1): Pt will complete sit<>stand transfers with LRAD with min assist PT Short Term Goal 3 (Week 1): Pt will complete bed<>chair transfers with LRAD with mod assist PT Short Term Goal 4 (Week 1): Pt will ambulate with LRAD 75' with mod assist  Skilled Therapeutic Interventions/Progress Updates: Patient sitting in [facility] with nsg providing medication on entrance to room. Patient alert and agreeable to PT session.   Patient with no complaints of pain.  Therapeutic Activity: Transfers: Pt performed sit<>stand transfer to the R (non-paretic side leading). Pt required heavy modA/maxA to avoid anterior LOB with VC required to pivot steps and weight shift accordingly. Pt with decreased WB tolerance on L LE with knee locked in extension (worked on NMRE to assist following). Pt transferred from WC<recliner at end of session due to room set up and time management. Pt with CGA to perform sit<>stand to STEDY and to recliner.  Neuromuscular Re-ed: NMR facilitated during session with focus on weight shifting, proprioceptive feedback, neuromuscular control/connection, standing balance, postural control. - Short sitting edge of mat LAQ L LE with 2lb ankle weight donned on either side of theraband on dorsal aspect of L foot to increase challenge to eccentric control. Pt required modA to achieve full extension at knee with cues to engage quadriceps, then hold 3 seconds, then control eccentric. 2 rounds performed with rest breaks required (pt close to fatigue both rounds).   Maxi Sky harness donned   - Pt on green level strap. Standing in RW, pt performed weight shifting  laterally and posteriorly (pt with anterior weight shift bias - PTA provided tactile cuing for pt to press low back into hand to shift weight back to center). Pt cued to maintain soft bend in L knee to avoid locked in extension. Pt cued to perform several reps of mini squats, then PTA donned red theraband around L knee pulling pt into flexion with cues for pt to control eccentric. Pt also cued to adjust stance to improve neutral BOS vs narrow presentation throughout (needed increased time to adjust due to decreased motor control/planning).    - Pt ambulated short distance in RW (L hemi-grip) with cuing to maintain soft bend in L knee (unable to maintain as it continuously locks in extension, but able to bend after). Pt also required manual facilitation at first to weight shift accordingly, then able to do so with modA. Pt decreased stance time on L with cuing to increase step length on R. Pt also with anterior weight shift with max cuing throughout to maintain center shift by pressing low back into PTA hand throughout, adjusting when needed in static stance.   NMR performed for improvements in motor control and coordination, balance, sequencing, judgement, and self confidence/ efficacy in performing all aspects of mobility at highest level of independence.   Patient sitting in recliner at end of session with brakes locked, chair alarm set, and all needs within reach.      Therapy Documentation Precautions:  Precautions Precautions: Fall Precaution/Restrictions Comments: falls, L hemi, mild L sided inattention with L lateral lean Restrictions Weight Bearing Restrictions Per Provider Order: No  Therapy/Group: Individual Therapy  Ericberto Padget PTA 07/26/2023, 12:28 PM

## 2023-07-26 NOTE — Progress Notes (Signed)
 Occupational Therapy Session Note  Patient Details  Name: [name]. MRN: [id number] Date of Birth: 1950-10-14  Today's Date: 07/26/2023 OT Individual Time: 9528-4132 OT Individual Time Calculation (min): 55 min    Short Term Goals: Week 2:  OT Short Term Goal 1 (Week 2): Pt will complete toilet transfers with mod A or less using squat or stand pivot. OT Short Term Goal 2 (Week 2): Pt will don shirt with min A using hemi dressing techniques using L ARM as an active assist. OT Short Term Goal 3 (Week 2): Pt will be able to hold L arm above shoulder height for 10 + seconds to be able to wash under arm. OT Short Term Goal 4 (Week 2): Pt will stand and pull pants over hips with 50% or less assist.  Skilled Therapeutic Interventions/Progress Updates:  Pt greeted seated in recliner, pt agreeable to OT intervention.      Transfers/bed mobility/functional mobility:  Pt completed sit>stand to stedy with MINA. Pt additionally completed sit>stands pulling up on // bars with pt able to stand with MINA.    ADLs:  Transfers: dependent transfer in stedy to [facility] over toilet.  Toileting: continent urine void with pt needing total A for 3/3 toileting tasks and assist to hold urinal in standing.     NMR: pt completed 2 sets of standing squats on the outside of the // bars with MODA with LLE blocked and LUE supported on bars.   1:1 NMES applied to L wrist extensors at the below settings:    Ratio 1:3 Rate 35 pps Waveform- Asymmetric Ramp 1.0 Pulse 300 Intensity- 24  Duration -   10 mins  Pt instructed to squeeze compliant sponge when channel was off and allow NMES to activate digit/wrist extension to let go of compliant sponge, no pain or adverse reactions noted during session.    Exercises: pt able to grasp cone in L hand with coband utilized to secure L hand to cone, pt then able to complete various therex for LUE to strengthen shoulder girdle to facilitate improved strength and  endurance for higher level functional mobility task:  X10 scapular protraction/retraction from a wrist pronated position as well as wrist in neutral position  X10 elbow flexion extension from wrist in neutral position as well as wrist supinated.                  Ended session with pt seated in recliner with all needs within reach and chair alarm activated.                    Therapy Documentation Precautions:  Precautions Precautions: Fall Precaution/Restrictions Comments: falls, L hemi, mild L sided inattention with L lateral lean Restrictions Weight Bearing Restrictions Per Provider Order: No  Pain: no pain     Therapy/Group: Individual Therapy  Willadean Hark 07/26/2023, 3:18 PM

## 2023-07-26 NOTE — Progress Notes (Signed)
 Occupational Therapy Weekly Progress Note  Patient Details  Name: Baljinder Krech. MRN: [id number] Date of Birth: 1950/09/07  Beginning of progress report period: July 20, 2023 End of progress report period: Jul 26, 2023  Today's Date: 07/26/2023 OT Individual Time: 4098-1191 OT Individual Time Calculation (min): 85 min    Patient has met 2 of 4 short term goals.  Pt is progressing in other goals but has not been able to fully accomplish at a consistent level. Overall he has made strong strides in mobility with sitting balance, transfers, sit to stands and excellent progress with elbow and shoulder AROM.  Patient continues to demonstrate the following deficits: unbalanced muscle activation, field cut, decreased attention to left and decreased motor planning, decreased memory, delayed processing, , and decreased sitting balance, decreased standing balance, decreased postural control, hemiplegia, and balance and therefore will continue to benefit from skilled OT intervention to enhance overall performance with BADL.  Patient progressing toward long term goals..  Continue plan of care.  OT Short Term Goals Week 1:  OT Short Term Goal 1 (Week 1): Pt will perform sit <> stands at LRAD with MOD A OT Short Term Goal 1 - Progress (Week 1): Met OT Short Term Goal 2 (Week 1): Pt will perform toilet transfer with LRAD and MOD of 1 OT Short Term Goal 2 - Progress (Week 1): Progressing toward goal OT Short Term Goal 3 (Week 1): Pt will improve MMT/ROM of LUE to 2/5 OT Short Term Goal 3 - Progress (Week 1): Met OT Short Term Goal 4 (Week 1): Pt will utilize hemitechniques for dressing consistently with MIN cues OT Short Term Goal 4 - Progress (Week 1): Progressing toward goal Week 2:  OT Short Term Goal 1 (Week 2): Pt will complete toilet transfers with mod A or less using squat or stand pivot. OT Short Term Goal 2 (Week 2): Pt will don shirt with min A using hemi dressing techniques using L ARM as  an active assist. OT Short Term Goal 3 (Week 2): Pt will be able to hold L arm above shoulder height for 10 + seconds to be able to wash under arm. OT Short Term Goal 4 (Week 2): Pt will stand and pull pants over hips with 50% or less assist.  Skilled Therapeutic Interventions/Progress Updates:     Pt received in recliner ready for therapy.  Focus of therapy session on LUE motor control.     ADL Retraining: (Pt's wife assisted pt with bathing and dressing prior to session)  Transfers: - stand pivot recliner to w/c to L side mod - max A as he needed A to adjust positioning of L leg. Cues for forward wt shift -adjusting hips forward and back in recliner and wc with cues for forward shoulders and lifting hips vs thrusting hips forward  -sq pivot wc to mat to R mod A with pt using R hand as a support, squat pivot to L min A.  Pt has more control of LLE with squat vs stand.  Balance: -able to sit unsupported at edge of mat for over 1 hour  Neuromuscular Re-Education:  -table top slides for sh and elb control -UE ranger for isometric holds in full arm extension to focus on sh and tricep strength -hand over hand cone grasp -simulation of pouring water for pronation/supination control -simulation of lifting cup to mouth -estim with EMpi on small muscle atrophy setting for 20 min at intensity 24.  Pt is NOT able  to feel stimulation and NOT able to feel his digits moving so kept stimulation lower to ensure he would not have a skin reaction.  Pads placed around foam dressings on dorsal side of forearm to stimulate finger extension. Pt worked on looking at fingers when extending and trying to move them futher.  With intensity of 24, pt's fingers extended 50% of range. NO adverse skin reactions.  Pt MAY be able to tolerate a higher level of stimulation to get an increased motor response.    Pt resting in w/c with all needs met. Alarm set and call light in reach.     Therapy  Documentation Precautions:  Precautions Precautions: Fall Precaution/Restrictions Comments: falls, L hemi, mild L sided inattention with L lateral lean Restrictions Weight Bearing Restrictions Per Provider Order: No    Pain:  No c/o pain  ADL: ADL Eating: Supervision/safety Grooming: Supervision/safety Upper Body Bathing: Moderate assistance Where Assessed-Upper Body Bathing: Shower Lower Body Bathing: Maximal assistance Where Assessed-Lower Body Bathing: Shower Upper Body Dressing: Moderate assistance Lower Body Dressing: Maximal assistance Toileting: Dependent Where Assessed-Toileting: Teacher, adult education: Dependent (stedy) Film/video editor: Dependent (using a stedy lift) ADL Comments: with stedy lift, pt is able to sit to stand with CGA. Using stedy for safety with ADLs and squat pivots from w/c >< bed or mat.  Therapy/Group: Individual Therapy  Symone Cornman 07/26/2023, 12:26 PM

## 2023-07-26 NOTE — Progress Notes (Addendum)
 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 Recent Labs    07/25/23 0553  WBC 7.4  HGB 11.3*  HCT 34.8*  PLT 339    Recent Labs    07/25/23 0553  NA 143  K 3.7  CL 106  CO2 23  GLUCOSE 98  BUN 20  CREATININE 1.21  CALCIUM  8.8*     Intake/Output Summary (Last 24 hours) at 07/26/2023 0818 Last data filed at 07/26/2023 0811 Gross per 24 hour  Intake 600 ml  Output 0 ml  Net 600 ml        Physical Exam: Vital Signs Blood pressure 134/65, pulse 65, temperature 98.1 F (36.7 C), temperature source Oral, resp. rate 18, SpO2 98%.   General: No acute distress Mood and affect are appropriate Heart: Regular rate and rhythm no rubs murmurs or extra sounds Lungs: Clear to auscultation, breathing unlabored, no rales or wheezes Abdomen: Positive bowel sounds, soft nontender to palpation, nondistended Extremities: No clubbing, cyanosis, or edema Skin: No evidence of breakdown, no evidence of rash  Neurologic: Cranial nerves II through XII intact, motor strength is 5/5 in right and 3-/5 left deltoid, bicep, tricep,0/5 FE and  grip, 3-hip flexor, knee extensors, ankle dorsiflexor and plantar flexor. Flexor tone in left fingers/wrist. DTR's brisk.  Sensory exam normal sensation to light touch  in bilateral upper and lower extremities Cerebellar exam remains limited by weakness on the left side  Musculoskeletal: Full passive  range of motion in all 4 extremities. No joint swelling   Assessment/Plan: 1. Functional deficits which require 3+ hours per day of interdisciplinary therapy in a comprehensive inpatient rehab setting. Physiatrist is providing close team supervision and 24 hour management of active medical  problems listed below. Physiatrist and rehab team continue to assess barriers to discharge/monitor patient progress toward functional and medical goals  Care Tool:  Bathing    Body parts bathed by patient: Left arm, Chest, Abdomen, Right upper leg, Left upper leg, Face   Body parts bathed by helper: Right arm, Front perineal area, Buttocks, Right lower leg, Left lower leg     Bathing assist Assist Level: Maximal Assistance - Patient 24 - 49%     Upper Body Dressing/Undressing Upper body dressing   What is the patient wearing?: Pull over shirt    Upper body assist Assist Level: Maximal Assistance - Patient 25 - 49%    Lower Body Dressing/Undressing Lower body dressing      What is the patient wearing?: Pants     Lower body assist Assist for lower body dressing: Dependent - Patient 0%     Toileting Toileting    Toileting assist Assist for toileting: Total Assistance - Patient < 25%     Transfers Chair/bed transfer  Transfers assist     Chair/bed transfer assist level: 2 Helpers (MAX A +2 squat pivot)     Locomotion Ambulation   Ambulation assist   Ambulation activity did not occur:  Safety/medical concerns (ambulated 38' with RHR in hallway mod assist and +2 WC follow)  Assist level: Maximal Assistance - Patient 25 - 49% Assistive device:  (hand rail as well as HR and under L shoulder.) Max distance: 30   Walk 10 feet activity   Assist  Walk 10 feet activity did not occur: Safety/medical concerns  Assist level: Maximal Assistance - Patient 25 - 49% Assistive device:  (hand rail as well as HR and under L shoulder.)   Walk 50 feet activity   Assist Walk 50 feet with 2 turns activity did not occur: Safety/medical concerns         Walk 150 feet activity   Assist Walk 150 feet activity did not occur: Safety/medical concerns         Walk 10 feet on uneven surface  activity   Assist Walk 10 feet on uneven surfaces activity did not occur:  Safety/medical concerns         Wheelchair     Assist Is the patient using a wheelchair?: Yes Type of Wheelchair: Manual    Wheelchair assist level: Dependent - Patient 0%      Wheelchair 50 feet with 2 turns activity    Assist        Assist Level: Dependent - Patient 0%   Wheelchair 150 feet activity     Assist      Assist Level: Dependent - Patient 0%   Blood pressure 134/65, pulse 65, temperature 98.1 F (36.7 C), temperature source Oral, resp. rate 18, SpO2 98%.  Medical Problem List and Plan: 1. 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  Antithrombotics: -DVT/anticoagulation:  Pharmaceutical: Lovenox              -antiplatelet therapy: ASA 3. Pain Management: Tylenol  prn.  4. Mood/Behavior/Sleep: LCSW to follow for evaluation and support.              -antipsychotic agents: N/A 5. Neuropsych/cognition: This patient is capable of making decisions on his own behalf. 6. Skin/Wound Care: LCSW to follow for evaluation and support             --Gerhardt's butt cream to MASD on buttocks             --Diflucan for candida cruris.              --pressure relief measures to left elbow/left heel.  7. Fluids/Electrolytes/Nutrition:  encourage appropriate PO    Latest Ref Rng & Units 07/25/2023    5:53 AM 07/22/2023    5:28 AM 07/20/2023    2:26 AM  BMP  Glucose 70 - 99 mg/dL 98  147  829   BUN 8 - 23 mg/dL 20  19  16    Creatinine 0.61 - 1.24 mg/dL 5.62  1.30  8.65   Sodium 135 - 145 mmol/L 143  140  138   Potassium 3.5 - 5.1 mmol/L 3.7  4.0  3.8   Chloride 98 - 111 mmol/L 106  103  104   CO2 22 - 32 mmol/L 23  25  23    Calcium  8.9 - 10.3 mg/dL 8.8  8.9  8.5  4/28 Cr sl elevated--might be near baseline 8. T2DM: Hgb A1C- 8.2 (PTA) Was taking Tresiba 54  units in am ( now on Semglee  22U ) and  Metformin  1000 mg--bid (restart Metformin  at 500mg  BID)   CBG (last 3)  Recent Labs    07/25/23 1703 07/25/23 2058 07/26/23 0559  GLUCAP 187* 173* 113*   Increased Semglee  to 25 U on 4/27   Add Novalog 3U TID WC 5/1- improved CBGs, now<200   -monitor for consistent pattern- am CBG in range but pm a bit elevated will not increase metformin  due to creat 9.   HTN: Monitor BP TID--losartan  50 mg daily--was held for a few days to prevent hypoperfusion             -- parameters to hold prn sitting SBP< 120.  Vitals:   07/25/23 1954 07/26/23 0536  BP: 113/60 134/65  Pulse: 65 65  Resp: 18   Temp: 97.9 F (36.6 C) 98.1 F (36.7 C)  SpO2: 98% 98%    10.  Myasthenia Gravis Mycophenolate  1500/1250mg  --Dr. Franklin Ito at [facility].              Prednisone  5 mg PTA--->has been weaned down to 2 mg daily, monitor for Ptosis and generalized proximal weakness 11. Hypothyroid: Stable on levothyroxine  for supplement.  12.  Dyslipidemia: Was on pravastatin 80 mg daily 13. H/o Kidney stones: On Sodium bicarb BID to prevent stones.  14. Constipation: On miralax  daily             --sorbitol  added.   -last bm 4/26 15. H/o GERD: Has been off PPI.  16.  Mild anemia check stool guaic on ASA and Lovenox  LOS: 7 days A FACE TO FACE EVALUATION WAS PERFORMED  Genetta Kenning 07/26/2023, 8:18 AM

## 2023-07-27 DIAGNOSIS — Z794 Long term (current) use of insulin: Secondary | ICD-10-CM | POA: Diagnosis not present

## 2023-07-27 DIAGNOSIS — E1169 Type 2 diabetes mellitus with other specified complication: Secondary | ICD-10-CM | POA: Diagnosis not present

## 2023-07-27 DIAGNOSIS — G8114 Spastic hemiplegia affecting left nondominant side: Secondary | ICD-10-CM | POA: Diagnosis not present

## 2023-07-27 DIAGNOSIS — I63511 Cerebral infarction due to unspecified occlusion or stenosis of right middle cerebral artery: Secondary | ICD-10-CM | POA: Diagnosis not present

## 2023-07-27 LAB — GLUCOSE, CAPILLARY
Glucose-Capillary: 138 mg/dL — ABNORMAL HIGH (ref 70–99)
Glucose-Capillary: 246 mg/dL — ABNORMAL HIGH (ref 70–99)
Glucose-Capillary: 258 mg/dL — ABNORMAL HIGH (ref 70–99)

## 2023-07-27 NOTE — Progress Notes (Signed)
 Physical Therapy Session Note  Patient Details  Name: [name]. MRN: [id number] Date of Birth: 12-May-1950  Today's Date: 07/27/2023 PT Individual Time: 0815-0900 PT Individual Time Calculation (min): 45 min   Short Term Goals: Week 1:  PT Short Term Goal 1 (Week 1): Pt will complete bed mobility mod assist PT Short Term Goal 2 (Week 1): Pt will complete sit<>stand transfers with LRAD with min assist PT Short Term Goal 3 (Week 1): Pt will complete bed<>chair transfers with LRAD with mod assist PT Short Term Goal 4 (Week 1): Pt will ambulate with LRAD 62' with mod assist  Skilled Therapeutic Interventions/Progress Updates: Patient sitting in recliner with wife present on entrance to room. Patient alert and agreeable to PT session.   PTA picked up extra minutes of PT for this pt due to cancellation of another session. Pt willing to participate in unscheduled therapy. Session focused on transfers, bed mobility and sit to stands. Pt performed sit<>stand pivot transfers throughout session to the L with maxA on first trial from recliner<EOB, then progressed to overall modA throughout rest of session with multimodal cues to weight shift accordingly (pt also required PTA to stabilize RW for safety). Pt performed sit<>supine from EOB with heavy modA with VC for sequence and hand placement (bed flat). Pt required modA to stand on first few attempts during transfers, then min/CGA when adhering to cues of keeping L LE closer to midline (in ER), and to weight shift anteriorly. Pt transported to day room gym in [facility]. Pt performed sit<>stands from hi/low mat with CGA (R HHA) and with gait belt around B knees, and with soccer ball between knees to promote neutral alignment of L LE (VC throughout to maintain adequate hip adduction on L to avoid ball from falling). Belt doffed and only ball left on after pt performed multiple reps. Pt still with CGA and minA to stabilize standing balance. Pt performed multiple  mini squats with several reps having 2-3 second pauses at mini squatted position. Pt transported back to room in [facility] and thankful for pop up PT as pt not scheduled for therapy over the weekend.  Patient sitting in WC at end of session with brakes locked, wife present and all needs within reach.      Therapy Documentation Precautions:  Precautions Precautions: Fall Precaution/Restrictions Comments: falls, L hemi, mild L sided inattention with L lateral lean Restrictions Weight Bearing Restrictions Per Provider Order: No   Therapy/Group: Individual Therapy  Breckyn Ticas PTA 07/27/2023, 12:11 PM

## 2023-07-27 NOTE — Progress Notes (Signed)
 PROGRESS NOTE   Subjective/Complaints:  Overall doing well. No new complains. Making progress but wants it to be faster!  ROS: Patient denies fever, rash, sore throat, blurred vision, dizziness, nausea, vomiting, diarrhea, cough, shortness of breath or chest pain, joint or back/neck pain, headache, or mood change.    Objective:   No results found. Recent Labs    07/25/23 0553  WBC 7.4  HGB 11.3*  HCT 34.8*  PLT 339    Recent Labs    07/25/23 0553  NA 143  K 3.7  CL 106  CO2 23  GLUCOSE 98  BUN 20  CREATININE 1.21  CALCIUM  8.8*     Intake/Output Summary (Last 24 hours) at 07/27/2023 0843 Last data filed at 07/27/2023 0801 Gross per 24 hour  Intake 358 ml  Output 125 ml  Net 233 ml        Physical Exam: Vital Signs Blood pressure (!) 149/65, pulse 69, temperature 98.5 F (36.9 C), resp. rate 17, SpO2 97%.   Constitutional: No distress . Vital signs reviewed. HEENT: NCAT, EOMI, oral membranes moist Neck: supple Cardiovascular: RRR without murmur. No JVD    Respiratory/Chest: CTA Bilaterally without wheezes or rales. Normal effort    GI/Abdomen: BS +, non-tender, non-distended Ext: no clubbing, cyanosis, or edema Psych: pleasant and cooperative  Skin: No evidence of breakdown, no evidence of rash  Neurologic: Cranial nerves II through XII intact, motor strength is 5/5 in right and 3-/5 left deltoid, bicep, tricep,0/5 FE and  grip, 3-hip flexor, knee extensors, ankle dorsiflexor and plantar flexor. Flexor tone in left fingers/wrist. DTR's remain brisk.  Sensory exam normal sensation to light touch  in bilateral upper and lower extremities Cerebellar exam remains limited by weakness on the left side  Prior neuro assessment is c/w today's exam 07/27/2023.  Musculoskeletal: Full passive  range of motion in all 4 extremities. No joint swelling   Assessment/Plan: 1. Functional deficits which require 3+  hours per day of interdisciplinary therapy in a comprehensive inpatient rehab setting. Physiatrist is providing close team supervision and 24 hour management of active medical problems listed below. Physiatrist and rehab team continue to assess barriers to discharge/monitor patient progress toward functional and medical goals  Care Tool:  Bathing    Body parts bathed by patient: Left arm, Chest, Abdomen, Right upper leg, Left upper leg, Face   Body parts bathed by helper: Right arm, Front perineal area, Buttocks, Right lower leg, Left lower leg     Bathing assist Assist Level: Moderate Assistance - Patient 50 - 74%     Upper Body Dressing/Undressing Upper body dressing   What is the patient wearing?: Pull over shirt    Upper body assist Assist Level: Moderate Assistance - Patient 50 - 74%    Lower Body Dressing/Undressing Lower body dressing      What is the patient wearing?: Pants     Lower body assist Assist for lower body dressing: Maximal Assistance - Patient 25 - 49%     Toileting Toileting    Toileting assist Assist for toileting: Total Assistance - Patient < 25%     Transfers Chair/bed transfer  Transfers assist  Chair/bed transfer assist level: 2 Helpers (MAX A +2 squat pivot)     Locomotion Ambulation   Ambulation assist   Ambulation activity did not occur: Safety/medical concerns (ambulated 25' with RHR in hallway mod assist and +2 WC follow)  Assist level: Maximal Assistance - Patient 25 - 49% Assistive device:  (hand rail as well as HR and under L shoulder.) Max distance: 30   Walk 10 feet activity   Assist  Walk 10 feet activity did not occur: Safety/medical concerns  Assist level: Maximal Assistance - Patient 25 - 49% Assistive device:  (hand rail as well as HR and under L shoulder.)   Walk 50 feet activity   Assist Walk 50 feet with 2 turns activity did not occur: Safety/medical concerns         Walk 150 feet  activity   Assist Walk 150 feet activity did not occur: Safety/medical concerns         Walk 10 feet on uneven surface  activity   Assist Walk 10 feet on uneven surfaces activity did not occur: Safety/medical concerns         Wheelchair     Assist Is the patient using a wheelchair?: Yes Type of Wheelchair: Manual    Wheelchair assist level: Dependent - Patient 0%      Wheelchair 50 feet with 2 turns activity    Assist        Assist Level: Dependent - Patient 0%   Wheelchair 150 feet activity     Assist      Assist Level: Dependent - Patient 0%   Blood pressure (!) 149/65, pulse 69, temperature 98.5 F (36.9 C), resp. rate 17, SpO2 97%.  Medical Problem List and Plan: 1. 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  Antithrombotics: -DVT/anticoagulation:  Pharmaceutical: Lovenox              -antiplatelet therapy: ASA 3. Pain Management: Tylenol  prn.  4. Mood/Behavior/Sleep: LCSW to follow for evaluation and support.              -antipsychotic agents: N/A 5. Neuropsych/cognition: This patient is capable of making decisions on his own behalf. 6. Skin/Wound Care: LCSW to follow for evaluation and support             --Gerhardt's butt cream to MASD on buttocks             --Diflucan for candida cruris.              --pressure relief measures to left elbow/left heel.  7. Fluids/Electrolytes/Nutrition:  encourage appropriate PO    Latest Ref Rng & Units 07/25/2023    5:53 AM 07/22/2023    5:28 AM 07/20/2023    2:26 AM  BMP  Glucose 70 - 99 mg/dL 98  161  096   BUN 8 - 23 mg/dL 20  19  16    Creatinine 0.61 - 1.24 mg/dL 0.45  4.09  8.11   Sodium 135 - 145 mmol/L 143  140  138   Potassium 3.5 - 5.1 mmol/L 3.7  4.0  3.8   Chloride 98 - 111 mmol/L 106  103   104  CO2 22 - 32 mmol/L 23  25  23    Calcium  8.9 - 10.3 mg/dL 8.8  8.9  8.5      5/28 Cr sl elevated--might be near baseline 8. T2DM: Hgb A1C- 8.2 (PTA) Was taking Tresiba 54 units in am ( now on Semglee  22U ) and  Metformin  1000 mg--bid (restart Metformin  at 500mg  BID)   CBG (last 3)  Recent Labs    07/26/23 1643 07/26/23 2037 07/27/23 0604  GLUCAP 172* 223* 138*   Increased Semglee  to 25 U on 4/27   Add Novalog 3U TID WC 5/1- improved CBGs, now<200   -monitor for consistent pattern- am CBG in range but pm a bit elevated will not increase metformin  due to creat  5/3 will increase novolog  to 4u tid 9.   HTN: Monitor BP TID--losartan  50 mg daily--was held for a few days to prevent hypoperfusion             -- parameters to hold prn sitting SBP< 120.  Vitals:   07/26/23 1953 07/27/23 0602  BP: 120/67 (!) 149/65  Pulse: 68 69  Resp: 17 17  Temp: 98.8 F (37.1 C) 98.5 F (36.9 C)  SpO2: 98% 97%    10.  Myasthenia Gravis Mycophenolate  1500/1250mg  --Dr. Franklin Ito at [facility].              Prednisone  5 mg PTA--->has been weaned down to 2 mg daily, monitor for Ptosis and generalized proximal weakness  -pt alert, no nm signs  11. Hypothyroid: Stable on levothyroxine  for supplement.  12.  Dyslipidemia: Was on pravastatin 80 mg daily 13. H/o Kidney stones: On Sodium bicarb BID to prevent stones.  14. Constipation: On miralax  daily             --sorbitol  added.   -last bm 5/1 15. H/o GERD: Has been off PPI.  16.  Mild anemia check stool guaic on ASA and Lovenox    LOS: 8 days A FACE TO FACE EVALUATION WAS PERFORMED  Rawland Caddy 07/27/2023, 8:43 AM

## 2023-07-27 NOTE — Progress Notes (Signed)
 Physical Therapy Weekly Progress Note  Patient Details  Name: [name]. MRN: [id number] Date of Birth: 1950-12-10  Beginning of progress report period: July 20, 2023 End of progress report period: Jul 27, 2023  Patient has met 1 of 4 short term goals. Pt is progressing towards 3/4 STG's set forth by evaluating PT. Pt has L LE extensor tone that causes retropulsion, but can stand with CGA/minA with correct mechanics (L LE also in ER). Pt ambulated less than 15' this week in RW with mod/heavy modA and WC follow. L LE presents with decreased eccentric control, leading to knee locking in slight hyperextension, which has limited pt's ability to adequately perform swing phase on contralateral LE. Will introduce appropriate AFO this week if eccentric control of quadriceps do not improve to progress pt towards meeting LTG's. Pt also has poor core engagement and decreased B UE strength, making bed mobility challenging.  Patient continues to demonstrate the following deficits muscle weakness, abnormal tone, decreased coordination, and decreased motor planning, decreased attention to left, and decreased sitting balance, decreased standing balance, decreased postural control, and decreased balance strategies and therefore will continue to benefit from skilled PT intervention to increase functional independence with mobility.  Patient {LTG progression:[id number]}.  {plan of WUJW:[id number]}  PT Short Term Goals Week 1:  PT Short Term Goal 1 (Week 1): Pt will complete bed mobility mod assist PT Short Term Goal 1 - Progress (Week 1): Progressing toward goal PT Short Term Goal 2 (Week 1): Pt will complete sit<>stand transfers with LRAD with min assist PT Short Term Goal 2 - Progress (Week 1): Met PT Short Term Goal 3 (Week 1): Pt will complete bed<>chair transfers with LRAD with mod assist PT Short Term Goal 3 - Progress (Week 1): Progressing toward goal PT Short Term Goal 4 (Week 1): Pt will ambulate  with LRAD 70' with mod assist PT Short Term Goal 4 - Progress (Week 1): Progressing toward goal  Therapy Documentation Precautions:  Precautions Precautions: Fall Precaution/Restrictions Comments: falls, L hemi, mild L sided inattention with L lateral lean Restrictions Weight Bearing Restrictions Per Provider Order: No  Fina Heizer PTA  07/27/2023, 12:50 PM

## 2023-07-28 DIAGNOSIS — G8114 Spastic hemiplegia affecting left nondominant side: Secondary | ICD-10-CM | POA: Diagnosis not present

## 2023-07-28 DIAGNOSIS — I63511 Cerebral infarction due to unspecified occlusion or stenosis of right middle cerebral artery: Secondary | ICD-10-CM | POA: Diagnosis not present

## 2023-07-28 DIAGNOSIS — E1169 Type 2 diabetes mellitus with other specified complication: Secondary | ICD-10-CM | POA: Diagnosis not present

## 2023-07-28 DIAGNOSIS — Z794 Long term (current) use of insulin: Secondary | ICD-10-CM | POA: Diagnosis not present

## 2023-07-28 LAB — GLUCOSE, CAPILLARY
Glucose-Capillary: 158 mg/dL — ABNORMAL HIGH (ref 70–99)
Glucose-Capillary: 195 mg/dL — ABNORMAL HIGH (ref 70–99)
Glucose-Capillary: 185 mg/dL — ABNORMAL HIGH (ref 70–99)
Glucose-Capillary: 183 mg/dL — ABNORMAL HIGH (ref 70–99)

## 2023-07-28 MED ORDER — INSULIN ASPART 100 UNIT/ML IJ SOLN
4.0000 [IU] | Freq: Three times a day (TID) | INTRAMUSCULAR | Status: DC
  Administered 2023-07-28 – 2023-07-30 (×6): 4 [IU] via SUBCUTANEOUS

## 2023-07-28 MED ORDER — INSULIN GLARGINE-YFGN 100 UNIT/ML ~~LOC~~ SOLN
27.0000 [IU] | Freq: Every day | SUBCUTANEOUS | Status: DC
  Administered 2023-07-29: 27 [IU] via SUBCUTANEOUS
  Filled 2023-07-28 (×2): qty 0.27

## 2023-07-28 MED ORDER — SORBITOL 70 % SOLN
30.0000 mL | Freq: Once | Status: AC
  Administered 2023-07-28: 30 mL via ORAL
  Filled 2023-07-28: qty 30

## 2023-07-28 NOTE — Progress Notes (Addendum)
 PROGRESS NOTE   Subjective/Complaints:  Feels he made some progress in therapy yesterday. Left arm still slow.   ROS: Patient denies fever, rash, sore throat, blurred vision, dizziness, nausea, vomiting, diarrhea, cough, shortness of breath or chest pain, joint or back/neck pain, headache, or mood change.    Objective:   No results found. No results for input(s): "WBC", "HGB", "HCT", "PLT" in the last 72 hours.   No results for input(s): "NA", "K", "CL", "CO2", "GLUCOSE", "BUN", "CREATININE", "CALCIUM " in the last 72 hours.    Intake/Output Summary (Last 24 hours) at 07/28/2023 1003 Last data filed at 07/28/2023 0726 Gross per 24 hour  Intake 476 ml  Output --  Net 476 ml        Physical Exam: Vital Signs Blood pressure (!) 148/65, pulse (!) 56, temperature 98.3 F (36.8 C), resp. rate 17, SpO2 97%.   Constitutional: No distress . Vital signs reviewed. HEENT: NCAT, EOMI, oral membranes moist Neck: supple Cardiovascular: RRR without murmur. No JVD    Respiratory/Chest: CTA Bilaterally without wheezes or rales. Normal effort    GI/Abdomen: BS +, non-tender, non-distended Ext: no clubbing, cyanosis, or edema Psych: pleasant and cooperative  Skin: No evidence of breakdown, no evidence of rash  Neurologic: Cranial nerves II through XII intact, motor strength is 5/5 in right and 3-/5 left deltoid, bicep, tricep,0/5 FE and  1+ grip, 3-hip flexor, knee extensors, ankle dorsiflexor and plantar flexor. Flexor tone in left fingers/wrist. DTR's remain brisk.  Sensory exam normal sensation to light touch  in bilateral upper and lower extremities Cerebellar exam remains limited by weakness on the left side  Prior neuro assessment is c/w today's exam 07/28/2023.  Musculoskeletal: Full passive  range of motion in all 4 extremities. No joint swelling   Assessment/Plan: 1. Functional deficits which require 3+ hours per day of  interdisciplinary therapy in a comprehensive inpatient rehab setting. Physiatrist is providing close team supervision and 24 hour management of active medical problems listed below. Physiatrist and rehab team continue to assess barriers to discharge/monitor patient progress toward functional and medical goals  Care Tool:  Bathing    Body parts bathed by patient: Left arm, Chest, Abdomen, Right upper leg, Left upper leg, Face   Body parts bathed by helper: Right arm, Front perineal area, Buttocks, Right lower leg, Left lower leg     Bathing assist Assist Level: Moderate Assistance - Patient 50 - 74%     Upper Body Dressing/Undressing Upper body dressing   What is the patient wearing?: Pull over shirt    Upper body assist Assist Level: Moderate Assistance - Patient 50 - 74%    Lower Body Dressing/Undressing Lower body dressing      What is the patient wearing?: Pants     Lower body assist Assist for lower body dressing: Maximal Assistance - Patient 25 - 49%     Toileting Toileting    Toileting assist Assist for toileting: Total Assistance - Patient < 25%     Transfers Chair/bed transfer  Transfers assist     Chair/bed transfer assist level: 2 Helpers (MAX A +2 squat pivot)     Locomotion Ambulation   Ambulation assist  Ambulation activity did not occur: Safety/medical concerns (ambulated 66' with RHR in hallway mod assist and +2 WC follow)  Assist level: Maximal Assistance - Patient 25 - 49% Assistive device:  (hand rail as well as HR and under L shoulder.) Max distance: 30   Walk 10 feet activity   Assist  Walk 10 feet activity did not occur: Safety/medical concerns  Assist level: Maximal Assistance - Patient 25 - 49% Assistive device:  (hand rail as well as HR and under L shoulder.)   Walk 50 feet activity   Assist Walk 50 feet with 2 turns activity did not occur: Safety/medical concerns         Walk 150 feet activity   Assist Walk 150  feet activity did not occur: Safety/medical concerns         Walk 10 feet on uneven surface  activity   Assist Walk 10 feet on uneven surfaces activity did not occur: Safety/medical concerns         Wheelchair     Assist Is the patient using a wheelchair?: Yes Type of Wheelchair: Manual    Wheelchair assist level: Dependent - Patient 0%      Wheelchair 50 feet with 2 turns activity    Assist        Assist Level: Dependent - Patient 0%   Wheelchair 150 feet activity     Assist      Assist Level: Dependent - Patient 0%   Blood pressure (!) 148/65, pulse (!) 56, temperature 98.3 F (36.8 C), resp. rate 17, SpO2 97%.  Medical Problem List and Plan: 1. 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  Antithrombotics: -DVT/anticoagulation:  Pharmaceutical: Lovenox              -antiplatelet therapy: ASA 3. Pain Management: Tylenol  prn.  4. Mood/Behavior/Sleep: LCSW to follow for evaluation and support.              -antipsychotic agents: N/A 5. Neuropsych/cognition: This patient is capable of making decisions on his own behalf. 6. Skin/Wound Care: LCSW to follow for evaluation and support             --Gerhardt's butt cream to MASD on buttocks             --Diflucan for candida cruris.              --pressure relief measures to left elbow/left heel.  7. Fluids/Electrolytes/Nutrition:  encourage appropriate PO    Latest Ref Rng & Units 07/25/2023    5:53 AM 07/22/2023    5:28 AM 07/20/2023    2:26 AM  BMP  Glucose 70 - 99 mg/dL 98  284  132   BUN 8 - 23 mg/dL 20  19  16    Creatinine 0.61 - 1.24 mg/dL 4.40  1.02  7.25   Sodium 135 - 145 mmol/L 143  140  138   Potassium 3.5 - 5.1 mmol/L 3.7  4.0  3.8   Chloride 98 - 111 mmol/L 106  103  104   CO2 22 - 32 mmol/L  23  25  23    Calcium  8.9 - 10.3 mg/dL 8.8  8.9  8.5      4/28 Cr sl elevated--might be near baseline 8. T2DM: Hgb A1C- 8.2 (PTA) Was taking Tresiba 54 units in am ( now on Semglee  22U ) and  Metformin  1000 mg--bid (restart Metformin  at 500mg  BID)   CBG (last 3)  Recent Labs    07/27/23 1137 07/27/23 2032 07/28/23 0623  GLUCAP 246* 258* 158*   Increased Semglee  to 25 U on 4/27   Add Novalog 3U TID WC 5/1- improved CBGs, now<200   -monitor for consistent pattern- am CBG in range but pm a bit elevated will not increase metformin  due to creat  5/4 increase novolog  to 4u tid    -adjust semglee  to 27 u 9.   HTN: Monitor BP TID--losartan  50 mg daily--was held for a few days to prevent hypoperfusion             -- parameters to hold prn sitting SBP< 120.  Vitals:   07/27/23 1942 07/28/23 0512  BP: 120/63 (!) 148/65  Pulse: 69 (!) 56  Resp: 17 17  Temp: 97.8 F (36.6 C) 98.3 F (36.8 C)  SpO2: 97% 97%    10.  Myasthenia Gravis Mycophenolate  1500/1250mg  --Dr. Franklin [facility]o at [facility].              Prednisone  5 mg PTA--->has been weaned down to 2 mg daily, monitor for Ptosis and generalized proximal weakness  -pt alert, no nm signs  11. Hypothyroid: Stable on levothyroxine  for supplement.  12.  Dyslipidemia: Was on pravastatin 80 mg daily 13. H/o Kidney stones: On Sodium bicarb BID to prevent stones.  14. Constipation: On miralax  daily             --sorbitol  added.   -last bm 5/1--> give sorbitol  today 5/4 15. H/o GERD: Has been off PPI.  16.  Mild anemia check stool guaic on ASA and Lovenox    LOS: 9 days A FACE TO FACE EVALUATION WAS PERFORMED  Rawland Caddy 07/28/2023, 10:03 AM

## 2023-07-29 DIAGNOSIS — G7000 Myasthenia gravis without (acute) exacerbation: Secondary | ICD-10-CM | POA: Diagnosis not present

## 2023-07-29 DIAGNOSIS — I639 Cerebral infarction, unspecified: Secondary | ICD-10-CM | POA: Diagnosis not present

## 2023-07-29 DIAGNOSIS — E1169 Type 2 diabetes mellitus with other specified complication: Secondary | ICD-10-CM | POA: Diagnosis not present

## 2023-07-29 DIAGNOSIS — I63511 Cerebral infarction due to unspecified occlusion or stenosis of right middle cerebral artery: Secondary | ICD-10-CM | POA: Diagnosis not present

## 2023-07-29 LAB — GLUCOSE, CAPILLARY
Glucose-Capillary: 178 mg/dL — ABNORMAL HIGH (ref 70–99)
Glucose-Capillary: 185 mg/dL — ABNORMAL HIGH (ref 70–99)
Glucose-Capillary: 197 mg/dL — ABNORMAL HIGH (ref 70–99)
Glucose-Capillary: 261 mg/dL — ABNORMAL HIGH (ref 70–99)

## 2023-07-29 LAB — CBC
HCT: 33.7 % — ABNORMAL LOW (ref 39.0–52.0)
Hemoglobin: 11.1 g/dL — ABNORMAL LOW (ref 13.0–17.0)
MCH: 30.6 pg (ref 26.0–34.0)
MCHC: 32.9 g/dL (ref 30.0–36.0)
MCV: 92.8 fL (ref 80.0–100.0)
Platelets: 346 10*3/uL (ref 150–400)
RBC: 3.63 MIL/uL — ABNORMAL LOW (ref 4.22–5.81)
RDW: 12.8 % (ref 11.5–15.5)
WBC: 7.2 10*3/uL (ref 4.0–10.5)
nRBC: 0 % (ref 0.0–0.2)

## 2023-07-29 LAB — BASIC METABOLIC PANEL WITH GFR
Anion gap: 8 (ref 5–15)
BUN: 16 mg/dL (ref 8–23)
CO2: 24 mmol/L (ref 22–32)
Calcium: 8.5 mg/dL — ABNORMAL LOW (ref 8.9–10.3)
Chloride: 106 mmol/L (ref 98–111)
Creatinine, Ser: 1.25 mg/dL — ABNORMAL HIGH (ref 0.61–1.24)
GFR, Estimated: >60 mL/min (ref >60)
Glucose, Bld: 173 mg/dL — ABNORMAL HIGH (ref 70–99)
Potassium: 4 mmol/L (ref 3.5–5.1)
Sodium: 138 mmol/L (ref 135–145)

## 2023-07-29 NOTE — Plan of Care (Signed)
 Goals upgraded/downgraded to reflect current progress Problem: RH Problem Solving Goal: LTG Patient will demonstrate problem solving for (SLP) Description: LTG:  Patient will demonstrate problem solving for basic/complex daily situations with cues  (SLP) Flowsheets (Taken 07/29/2023 1225) LTG: Patient will demonstrate problem solving for (SLP): Complex daily situations LTG Patient will demonstrate problem solving for: Supervision   Problem: RH Memory Goal: LTG Patient will demonstrate ability for day to day (SLP) Description: LTG:   Patient will demonstrate ability for day to day recall/carryover during cognitive/linguistic activities with assist  (SLP) Flowsheets (Taken 07/29/2023 1225) LTG: Patient will demonstrate ability for day to day recall: Daily complex information LTG: Patient will demonstrate ability for day to day recall/carryover during cognitive/linguistic activities with assist (SLP): Supervision   Problem: RH Attention Goal: LTG Patient will demonstrate this level of attention during functional activites (SLP) Description: LTG:  Patient will will demonstrate this level of attention during functional activites (SLP) Flowsheets (Taken 07/29/2023 1225) LTG: Patient will demonstrate this level of attention during cognitive/linguistic activities with assistance of (SLP): Supervision

## 2023-07-29 NOTE — Progress Notes (Signed)
 PROGRESS NOTE   Subjective/Complaints:  Feels he made some progress in therapy yesterday. Left arm still slow.   ROS: Patient denies CP, SOB, N/V/D   Objective:   No results found. Recent Labs    07/29/23 0536  WBC 7.2  HGB 11.1*  HCT 33.7*  PLT 346     Recent Labs    07/29/23 0536  NA 138  K 4.0  CL 106  CO2 24  GLUCOSE 173*  BUN 16  CREATININE 1.25*  CALCIUM  8.5*      Intake/Output Summary (Last 24 hours) at 07/29/2023 0928 Last data filed at 07/29/2023 0729 Gross per 24 hour  Intake 695 ml  Output --  Net 695 ml        Physical Exam: Vital Signs Blood pressure (!) 140/66, pulse 69, temperature 98.1 F (36.7 C), resp. rate 16, SpO2 97%.  General: No acute distress Mood and affect are appropriate Heart: Regular rate and rhythm no rubs murmurs or extra sounds Lungs: Clear to auscultation, breathing unlabored, no rales or wheezes Abdomen: Positive bowel sounds, soft nontender to palpation, nondistended Extremities: No clubbing, cyanosis, or edema Skin: No evidence of breakdown, no evidence of rash  Neurologic: Cranial nerves II through XII intact, motor strength is 5/5 in right and 3-/5 left deltoid, bicep, tricep,0/5 FE and  1+ grip, 3-hip flexor, knee extensors, ankle dorsiflexor and plantar flexor. Flexor tone in left fingers/wrist. DTR's remain brisk.  Sensory exam normal sensation to light touch  in bilateral upper and lower extremities Cerebellar exam remains limited by weakness on the left side  Prior neuro assessment is c/w today's exam 07/29/2023.  Musculoskeletal: Full passive  range of motion in all 4 extremities. No joint swelling   Assessment/Plan: 1. Functional deficits which require 3+ hours per day of interdisciplinary therapy in a comprehensive inpatient rehab setting. Physiatrist is providing close team supervision and 24 hour management of active medical problems listed  below. Physiatrist and rehab team continue to assess barriers to discharge/monitor patient progress toward functional and medical goals  Care Tool:  Bathing    Body parts bathed by patient: Left arm, Chest, Abdomen, Right upper leg, Left upper leg, Face   Body parts bathed by helper: Right arm, Front perineal area, Buttocks, Right lower leg, Left lower leg     Bathing assist Assist Level: Moderate Assistance - Patient 50 - 74%     Upper Body Dressing/Undressing Upper body dressing   What is the patient wearing?: Pull over shirt    Upper body assist Assist Level: Moderate Assistance - Patient 50 - 74%    Lower Body Dressing/Undressing Lower body dressing      What is the patient wearing?: Pants     Lower body assist Assist for lower body dressing: Maximal Assistance - Patient 25 - 49%     Toileting Toileting    Toileting assist Assist for toileting: Total Assistance - Patient < 25%     Transfers Chair/bed transfer  Transfers assist     Chair/bed transfer assist level: 2 Helpers (MAX A +2 squat pivot)     Locomotion Ambulation   Ambulation assist   Ambulation activity did not occur: Safety/medical  concerns (ambulated 39' with RHR in hallway mod assist and +2 WC follow)  Assist level: Maximal Assistance - Patient 25 - 49% Assistive device:  (hand rail as well as HR and under L shoulder.) Max distance: 30   Walk 10 feet activity   Assist  Walk 10 feet activity did not occur: Safety/medical concerns  Assist level: Maximal Assistance - Patient 25 - 49% Assistive device:  (hand rail as well as HR and under L shoulder.)   Walk 50 feet activity   Assist Walk 50 feet with 2 turns activity did not occur: Safety/medical concerns         Walk 150 feet activity   Assist Walk 150 feet activity did not occur: Safety/medical concerns         Walk 10 feet on uneven surface  activity   Assist Walk 10 feet on uneven surfaces activity did not occur:  Safety/medical concerns         Wheelchair     Assist Is the patient using a wheelchair?: Yes Type of Wheelchair: Manual    Wheelchair assist level: Dependent - Patient 0%      Wheelchair 50 feet with 2 turns activity    Assist        Assist Level: Dependent - Patient 0%   Wheelchair 150 feet activity     Assist      Assist Level: Dependent - Patient 0%   Blood pressure (!) 140/66, pulse 69, temperature 98.1 F (36.7 C), resp. rate 16, SpO2 97%.  Medical Problem List and Plan: 1. 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  Antithrombotics: -DVT/anticoagulation:  Pharmaceutical: Lovenox              -antiplatelet therapy: ASA 3. Pain Management: Tylenol  prn.  4. Mood/Behavior/Sleep: LCSW to follow for evaluation and support.              -antipsychotic agents: N/A 5. Neuropsych/cognition: This patient is capable of making decisions on his own behalf. 6. Skin/Wound Care: LCSW to follow for evaluation and support             --Gerhardt's butt cream to MASD on buttocks             --Diflucan for candida cruris.              --pressure relief measures to left elbow/left heel.  7. Fluids/Electrolytes/Nutrition:  encourage appropriate PO    Latest Ref Rng & Units 07/29/2023    5:36 AM 07/25/2023    5:53 AM 07/22/2023    5:28 AM  BMP  Glucose 70 - 99 mg/dL 161  98  096   BUN 8 - 23 mg/dL 16  20  19    Creatinine 0.61 - 1.24 mg/dL 0.45  4.09  8.11   Sodium 135 - 145 mmol/L 138  143  140   Potassium 3.5 - 5.1 mmol/L 4.0  3.7  4.0   Chloride 98 - 111 mmol/L 106  106  103   CO2 22 - 32 mmol/L 24  23  25    Calcium  8.9 - 10.3 mg/dL 8.5  8.8  8.9  4/28 Cr sl elevated--might be near baseline 8. T2DM: Hgb A1C- 8.2 (PTA) Was taking Tresiba 54 units in am ( now on Semglee   22U ) and  Metformin  1000 mg--bid (restart Metformin  at 500mg  BID)   CBG (last 3)  Recent Labs    07/28/23 1624 07/28/23 2129 07/29/23 0602  GLUCAP 185* 183* 178*   Increased Semglee  to 25 U on 4/27   Add Novalog 3U TID WC 5/1- improved CBGs, now<200   -monitor for consistent pattern- am CBG in range but pm a bit elevated will not increase metformin  due to creat  5/4 increase novolog  to 4u tid    -adjust semglee  to 27 u starting 5/5- monitor effect 9.   HTN: Monitor BP TID--losartan  50 mg daily--was held for a few days to prevent hypoperfusion             -- parameters to hold prn sitting SBP< 120.  Vitals:   07/28/23 2028 07/29/23 0514  BP: 123/68 (!) 140/66  Pulse: 65 69  Resp: 18 16  Temp: 98.5 F (36.9 C) 98.1 F (36.7 C)  SpO2: 96% 97%    10.  Myasthenia Gravis Mycophenolate  1500/1250mg  --Dr. Franklin Ito at [facility].              Prednisone  5 mg PTA--->has been weaned down to 2 mg daily, monitor for Ptosis and generalized proximal weakness  -pt alert, no nm signs  11. Hypothyroid: Stable on levothyroxine  for supplement.  12.  Dyslipidemia: Was on pravastatin 80 mg daily 13. H/o Kidney stones: On Sodium bicarb BID to prevent stones.  14. Constipation: On miralax  daily             --sorbitol  added.   -last bm 5/1--> give sorbitol  today 5/4 15. H/o GERD: Has been off PPI.  16.  Mild anemia check stool guaic on ASA and Lovenox    LOS: 10 days A FACE TO FACE EVALUATION WAS PERFORMED  Genetta Kenning 07/29/2023, 9:28 AM

## 2023-07-29 NOTE — Progress Notes (Signed)
 Speech Language Pathology Daily Session Note  Patient Details  Name: [name]. MRN: [id number] Date of Birth: July 04, 1950  Today's Date: 07/29/2023 SLP Individual Time: 1303-1400 SLP Individual Time Calculation (min): 57 min  Short Term Goals: Week 2: SLP Short Term Goal 1 (Week 2): Patient will complete mildly complex problem solving tasks with supervision multimodal A SLP Short Term Goal 2 (Week 2): Patient will recall and utilize memory strategies given supervision multimodal A SLP Short Term Goal 3 (Week 2): Patient will attend to L visual field during functional tasks given supervision multimodal A  Skilled Therapeutic Interventions:  Patient was seen in PM to address cognitive re- training. Pt was alert and seated upright in WC upon SLP arrival. He was agreeable for session. When prompted pt able to recall 3 of 4 WRAP compensatory strategies indep and remaining strategy with min A. SLP challenging pt in financial problem solving through challenging pt to identify solutions to mathematical word problems. Pt completed task with 88% acc indep improving to 100% with sup A. In other minutes of session pt challenged to utilize strategies functionally through use of a restaurant menu and subsequently being challenged to stay within a budget when given variable conditions. Pt completed task with overall sup A. At conclusion of session pt was left upright in [facility] with chair alarm active and call button within reach. SLP to continue POC.   Pain Pain Assessment Pain Score: 0-No pain  Therapy/Group: Individual Therapy  Adela Holter 07/29/2023, 3:43 PM

## 2023-07-29 NOTE — Progress Notes (Signed)
 Speech Language Pathology Weekly Progress and Session Note  Patient Details  Name: Styles Hennessee. MRN: [id number] Date of Birth: [birth year]  Beginning of progress report period: July 20, 2023 End of progress report period: Jul 29, 2023  Today's Date: 07/29/2023 SLP Individual Time: 0900-1000 SLP Individual Time Calculation (min): 60 min  Short Term Goals: Week 1: SLP Short Term Goal 1 (Week 1): Patient will solve complex financial problems with 90% accuracy with modI. SLP Short Term Goal 1 - Progress (Week 1): Not met SLP Short Term Goal 2 (Week 1): Patient will demonstrate recall of pertinent daily complex information with 90% accuracy with modI. SLP Short Term Goal 2 - Progress (Week 1): Not met    New Short Term Goals: Week 2: SLP Short Term Goal 1 (Week 2): Patient will complete mildly complex problem solving tasks with supervision multimodal A SLP Short Term Goal 2 (Week 2): Patient will recall and utilize memory strategies given supervision multimodal A SLP Short Term Goal 3 (Week 2): Patient will attend to L visual field during functional tasks given supervision multimodal A  Weekly Progress Updates: Pt has made fair gains however did not meet STGs this reporting period due to need for downgrade of LTG. Currently, patient continues to require min A for attention to the L, short term memory, and problem solving. POC changed to reflect patients current progress. Pt/family eduction ongoing. Pt would benefit from continued ST intervention to maximize cognition in order to maximize functional independence at d/c.    Intensity: Minumum of 1-2 x/day, 30 to 90 minutes Frequency: 1 to 3 out of 7 days Duration/Length of Stay: 5/15 Treatment/Interventions: Cueing hierarchy;Cognitive remediation/compensation;Internal/external aids;Therapeutic Activities;Functional tasks;Patient/family education   Daily Session  Skilled Therapeutic Interventions:  Skilled therapy session focused  on cognitive goals. SLP facilitated session by providing supervision-minA for patient to utilize L attention strategies to complete complex word search. Patient utilized scanning and colored anchor strategies to complete activity. SLP continued to challenge patient through memory task. SLP educated patient on WRAP (write, repeat, associate, picture) memory strategies. Patient recalled examples for each with supervision-minA. After a 10 minute delay, patient independently recalled 3/4 strategies and recalled remainder with minA. Patient with relatively intact long term memory as patient recalled specific details regarding organizational system for taxes. Patient left in chair with alarm set and call bell in reach. Continue POC.        Pain None reported   Therapy/Group: Individual Therapy  Mickenzie Stolar M.A., CCC-SLP 07/29/2023, 12:24 PM

## 2023-07-29 NOTE — Progress Notes (Signed)
 Physical Therapy Session Note  Patient Details  Name: [name]. MRN: [id number] Date of Birth: [birth year]  Today's Date: 07/29/2023 PT Individual Time: 1503-1530 PT Individual Time Calculation (min): 27 min   Short Term Goals: Week 1:  PT Short Term Goal 1 (Week 1): Pt will complete bed mobility mod assist PT Short Term Goal 1 - Progress (Week 1): Progressing toward goal PT Short Term Goal 2 (Week 1): Pt will complete sit<>stand transfers with LRAD with min assist PT Short Term Goal 2 - Progress (Week 1): Met PT Short Term Goal 3 (Week 1): Pt will complete bed<>chair transfers with LRAD with mod assist PT Short Term Goal 3 - Progress (Week 1): Progressing toward goal PT Short Term Goal 4 (Week 1): Pt will ambulate with LRAD 86' with mod assist PT Short Term Goal 4 - Progress (Week 1): Progressing toward goal  Skilled Therapeutic Interventions/Progress Updates:      Pt seated in WC upon arrival. Pt agreeable to therapy. Pt denies any pain.   Pt requesting to get in recliner. Pt performed sit to stand from [facility] with RW and mod A initially 2/2 L LE knee extensor tone and retropulsion.   Therapist opted to perform stand pivot transfer with no AD, with therapist guarding L LE, pt performed with min A with therapist facilitating anterior weight shift.   Pt performed sit to stand x5 from recliner with no AD and therapist assisting with positioning of L LE, and providing verbal and tactiel cues for anteiror weight shift.   Pt stood with no AD and min-mod A, while performing lateral weight shifting, verbal and tactile cues provided for midline orientation with emphasis on correction of R trunk rotation, and maintaining slight bend in L knee versus hyperextending-with therapist guarding for intermittent buckling of L knee.   Pt seated in recliner with all needs within reach and chair alarm on.   Therapy Documentation Precautions:  Precautions Precautions:  Fall Precaution/Restrictions Comments: falls, L hemi, mild L sided inattention with L lateral lean Restrictions Weight Bearing Restrictions Per Provider Order: No  Therapy/Group: Individual Therapy  [facility] Jenney Modest, [place], DPT  07/29/2023, 4:10 PM

## 2023-07-29 NOTE — Progress Notes (Signed)
 Occupational Therapy Session Note  Patient Details  Name: [name]. MRN: [id number] Date of Birth: 28-Feb-1951  Today's Date: 07/29/2023 OT Individual Time: 1045-1130 OT Individual Time Calculation (min): 45 min    Short Term Goals: Week 1:  OT Short Term Goal 1 (Week 1): Pt will perform sit <> stands at LRAD with MOD A OT Short Term Goal 1 - Progress (Week 1): Met OT Short Term Goal 2 (Week 1): Pt will perform toilet transfer with LRAD and MOD of 1 OT Short Term Goal 2 - Progress (Week 1): Progressing toward goal OT Short Term Goal 3 (Week 1): Pt will improve MMT/ROM of LUE to 2/5 OT Short Term Goal 3 - Progress (Week 1): Met OT Short Term Goal 4 (Week 1): Pt will utilize hemitechniques for dressing consistently with MIN cues OT Short Term Goal 4 - Progress (Week 1): Progressing toward goal  Skilled Therapeutic Interventions/Progress Updates:    Pt received in recliner and decline need for self care intervention. Pt agreeable to working on L side motor control.  Sitting balance: -focused on use of core strength for upright sitting with relaxing shoulders over hips -adjusted seat of recliner adding in 3 folded blankets for higher seat surface -forward reaching to promote skills needed for sit to stand  Sit to stand: -unable to push up from recliner in a safe manner with mod A as pt not shifting weight forward -used bar of stedy to assist with cues to lean forward - blocked practice with L knee for stand to squats in stedy with cues to not lock L knee out on stand as when he moves to sit it tends to buckle  Stand balance in stedy with hand support. -weight shifting to R as pt tends to lean left  UE NMR -a/arom of L shoulder, elbow and grasp with various reaching activities, B hands on dowel bar,  single hand isometric grasp on bar with pushing bar forward and back with resistance  Pt resting in recliner with all needs met.  Call light in reach.   Therapy  Documentation Precautions:  Precautions Precautions: Fall Precaution/Restrictions Comments: falls, L hemi, mild L sided inattention with L lateral lean Restrictions Weight Bearing Restrictions Per Provider Order: No General:   Vital Signs:   Pain: Pain Assessment Pain Score: 0-No pain ADL: ADL Eating: Supervision/safety Grooming: Supervision/safety Upper Body Bathing: Moderate assistance Where Assessed-Upper Body Bathing: Shower Lower Body Bathing: Maximal assistance Where Assessed-Lower Body Bathing: Shower Upper Body Dressing: Moderate assistance Lower Body Dressing: Maximal assistance Toileting: Dependent Where Assessed-Toileting: Teacher, adult education: Dependent (stedy) Film/video editor: Dependent (using a stedy lift) ADL Comments: with stedy lift, pt is able to sit to stand with CGA. Using stedy for safety with ADLs and squat pivots from w/c >< bed or mat.     Therapy/Group: Individual Therapy  Akaya Proffit 07/29/2023, 12:37 PM

## 2023-07-29 NOTE — Plan of Care (Signed)
  Problem: Consults Goal: RH STROKE PATIENT EDUCATION Description: See Patient Education module for education specifics  Outcome: Progressing   Problem: RH BOWEL ELIMINATION Goal: RH STG MANAGE BOWEL WITH ASSISTANCE Description: STG Manage Bowel with mod I  Assistance. Outcome: Progressing Goal: RH STG MANAGE BOWEL W/MEDICATION W/ASSISTANCE Description: STG Manage Bowel with Medication with mod I Assistance. Outcome: Progressing   Problem: RH SAFETY Goal: RH STG ADHERE TO SAFETY PRECAUTIONS W/ASSISTANCE/DEVICE Description: STG Adhere to Safety Precautions With cues Assistance/Device. Outcome: Progressing   Problem: RH PAIN MANAGEMENT Goal: RH STG PAIN MANAGED AT OR BELOW PT'S PAIN GOAL Description: < 4 with prns Outcome: Progressing   Problem: RH KNOWLEDGE DEFICIT Goal: RH STG INCREASE KNOWLEDGE OF DIABETES Description: Patient and spouse will be able to manage DM using educational resources for medications and dietary modification independently Outcome: Progressing Goal: RH STG INCREASE KNOWLEDGE OF HYPERTENSION Description: Patient and spouse will be able to manage HTN using educational resources for medications and dietary modification independently Outcome: Progressing Goal: RH STG INCREASE KNOWLEGDE OF HYPERLIPIDEMIA Description: Patient and spouse will be able to manage HLD using educational resources for medications and dietary modification independently Outcome: Progressing Goal: RH STG INCREASE KNOWLEDGE OF STROKE PROPHYLAXIS Description: Patient and spouse will be able to manage secondary risks using educational resources for medications and dietary modification independently Outcome: Progressing   Problem: Education: Goal: Ability to describe self-care measures that may prevent or decrease complications (Diabetes Survival Skills Education) will improve Outcome: Progressing Goal: Individualized Educational Video(s) Outcome: Progressing   Problem: Coping: Goal:  Ability to adjust to condition or change in health will improve Outcome: Progressing   Problem: Fluid Volume: Goal: Ability to maintain a balanced intake and output will improve Outcome: Progressing   Problem: Health Behavior/Discharge Planning: Goal: Ability to identify and utilize available resources and services will improve Outcome: Progressing Goal: Ability to manage health-related needs will improve Outcome: Progressing   Problem: Metabolic: Goal: Ability to maintain appropriate glucose levels will improve Outcome: Progressing   Problem: Nutritional: Goal: Maintenance of adequate nutrition will improve Outcome: Progressing Goal: Progress toward achieving an optimal weight will improve Outcome: Progressing   Problem: Skin Integrity: Goal: Risk for impaired skin integrity will decrease Outcome: Progressing   Problem: Tissue Perfusion: Goal: Adequacy of tissue perfusion will improve Outcome: Progressing   Problem: RH Vision Goal: RH LTG Vision (Specify) Outcome: Progressing

## 2023-07-30 DIAGNOSIS — E1169 Type 2 diabetes mellitus with other specified complication: Secondary | ICD-10-CM | POA: Diagnosis not present

## 2023-07-30 DIAGNOSIS — G7000 Myasthenia gravis without (acute) exacerbation: Secondary | ICD-10-CM | POA: Diagnosis not present

## 2023-07-30 DIAGNOSIS — I63511 Cerebral infarction due to unspecified occlusion or stenosis of right middle cerebral artery: Secondary | ICD-10-CM | POA: Diagnosis not present

## 2023-07-30 DIAGNOSIS — I639 Cerebral infarction, unspecified: Secondary | ICD-10-CM | POA: Diagnosis not present

## 2023-07-30 LAB — GLUCOSE, CAPILLARY
Glucose-Capillary: 163 mg/dL — ABNORMAL HIGH (ref 70–99)
Glucose-Capillary: 199 mg/dL — ABNORMAL HIGH (ref 70–99)
Glucose-Capillary: 194 mg/dL — ABNORMAL HIGH (ref 70–99)
Glucose-Capillary: 153 mg/dL — ABNORMAL HIGH (ref 70–99)

## 2023-07-30 MED ORDER — INSULIN ASPART 100 UNIT/ML IJ SOLN
5.0000 [IU] | Freq: Three times a day (TID) | INTRAMUSCULAR | Status: DC
  Administered 2023-07-30 – 2023-08-01 (×6): 5 [IU] via SUBCUTANEOUS

## 2023-07-30 MED ORDER — INSULIN GLARGINE-YFGN 100 UNIT/ML ~~LOC~~ SOLN
30.0000 [IU] | Freq: Every day | SUBCUTANEOUS | Status: DC
  Administered 2023-07-30 – 2023-08-06 (×8): 30 [IU] via SUBCUTANEOUS
  Filled 2023-07-30 (×8): qty 0.3

## 2023-07-30 NOTE — Plan of Care (Signed)
  Problem: Consults Goal: RH STROKE PATIENT EDUCATION Description: See Patient Education module for education specifics  Outcome: Progressing   Problem: RH BOWEL ELIMINATION Goal: RH STG MANAGE BOWEL WITH ASSISTANCE Description: STG Manage Bowel with mod I  Assistance. Outcome: Progressing   Problem: RH BOWEL ELIMINATION Goal: RH STG MANAGE BOWEL W/MEDICATION W/ASSISTANCE Description: STG Manage Bowel with Medication with mod I Assistance. Outcome: Progressing   Problem: RH SAFETY Goal: RH STG ADHERE TO SAFETY PRECAUTIONS W/ASSISTANCE/DEVICE Description: STG Adhere to Safety Precautions With cues Assistance/Device. Outcome: Progressing   Problem: RH PAIN MANAGEMENT Goal: RH STG PAIN MANAGED AT OR BELOW PT'S PAIN GOAL Description: < 4 with prns Outcome: Progressing   Problem: RH PAIN MANAGEMENT Goal: RH STG PAIN MANAGED AT OR BELOW PT'S PAIN GOAL Description: < 4 with prns Outcome: Progressing   Problem: Health Behavior/Discharge Planning: Goal: Ability to identify and utilize available resources and services will improve Outcome: Progressing   Problem: Health Behavior/Discharge Planning: Goal: Ability to identify and utilize available resources and services will improve Outcome: Progressing   Problem: Fluid Volume: Goal: Ability to maintain a balanced intake and output will improve Outcome: Progressing

## 2023-07-30 NOTE — Progress Notes (Signed)
 Speech Language Pathology Daily Session Note  Patient Details  Name: [name]. MRN: [id number] Date of Birth: 25-Oct-1950  Today's Date: 07/30/2023 SLP Individual Time: 0902-0931 SLP Individual Time Calculation (min): 29 min  Short Term Goals: Week 2: SLP Short Term Goal 1 (Week 2): Patient will complete mildly complex problem solving tasks with supervision multimodal A SLP Short Term Goal 2 (Week 2): Patient will recall and utilize memory strategies given supervision multimodal A SLP Short Term Goal 3 (Week 2): Patient will attend to L visual field during functional tasks given supervision multimodal A  Skilled Therapeutic Interventions:  Patient was seen in am to address cognitive re- training. Pt was alert and seated upright in WC upon SLP arrival. He denied pain and was agreeable for session. SLP addressing complex problem solving during session. Pt was challenged in a medication management task where he interpreted prescription labels. Pt completed task with 100% acc and sup A. In other minutes of session, pt's executive function skills were challenged through a planing, organization, and budgeting task. Pt completed task with overall sup A. Pt left upright in [facility] with chair alarm active at conclusion of session. SLP to continue POC.   Pain Pain Assessment Pain Scale: 0-10 Pain Score: 0-No pain  Therapy/Group: Individual Therapy  Adela Holter 07/30/2023, 11:19 AM

## 2023-07-30 NOTE — Progress Notes (Signed)
 PROGRESS NOTE   Subjective/Complaints:  Discussed DM management   ROS: Patient denies CP, SOB, N/V/D   Objective:   No results found. Recent Labs    07/29/23 0536  WBC 7.2  HGB 11.1*  HCT 33.7*  PLT 346     Recent Labs    07/29/23 0536  NA 138  K 4.0  CL 106  CO2 24  GLUCOSE 173*  BUN 16  CREATININE 1.25*  CALCIUM  8.5*      Intake/Output Summary (Last 24 hours) at 07/30/2023 0856 Last data filed at 07/30/2023 0849 Gross per 24 hour  Intake 420 ml  Output 650 ml  Net -230 ml        Physical Exam: Vital Signs Blood pressure 137/60, pulse (!) 52, temperature 98.3 F (36.8 C), resp. rate 18, SpO2 97%.  General: No acute distress Mood and affect are appropriate Heart: Regular rate and rhythm no rubs murmurs or extra sounds Lungs: Clear to auscultation, breathing unlabored, no rales or wheezes Abdomen: Positive bowel sounds, soft nontender to palpation, nondistended Extremities: No clubbing, cyanosis, or edema Skin: No evidence of breakdown, no evidence of rash  Neurologic: Cranial nerves II through XII intact, motor strength is 5/5 in right and 3-/5 left deltoid, bicep, tricep,0/5 FE and  1+ grip, 3-hip flexor, knee extensors, ankle dorsiflexor and plantar flexor. Flexor tone in left fingers/wrist. DTR's remain brisk.  Sensory exam normal sensation to light touch  in bilateral upper and lower extremities Cerebellar exam remains limited by weakness on the left side  Prior neuro assessment is c/w today's exam 07/30/2023.  Musculoskeletal: Full passive  range of motion in all 4 extremities. No joint swelling   Assessment/Plan: 1. Functional deficits which require 3+ hours per day of interdisciplinary therapy in a comprehensive inpatient rehab setting. Physiatrist is providing close team supervision and 24 hour management of active medical problems listed below. Physiatrist and rehab team continue to  assess barriers to discharge/monitor patient progress toward functional and medical goals  Care Tool:  Bathing    Body parts bathed by patient: Left arm, Chest, Abdomen, Right upper leg, Left upper leg, Face   Body parts bathed by helper: Right arm, Front perineal area, Buttocks, Right lower leg, Left lower leg     Bathing assist Assist Level: Moderate Assistance - Patient 50 - 74%     Upper Body Dressing/Undressing Upper body dressing   What is the patient wearing?: Pull over shirt    Upper body assist Assist Level: Moderate Assistance - Patient 50 - 74%    Lower Body Dressing/Undressing Lower body dressing      What is the patient wearing?: Pants     Lower body assist Assist for lower body dressing: Maximal Assistance - Patient 25 - 49%     Toileting Toileting    Toileting assist Assist for toileting: Total Assistance - Patient < 25%     Transfers Chair/bed transfer  Transfers assist     Chair/bed transfer assist level: 2 Helpers (MAX A +2 squat pivot)     Locomotion Ambulation   Ambulation assist   Ambulation activity did not occur: Safety/medical concerns (ambulated 50' with RHR in hallway mod  assist and +2 WC follow)  Assist level: Maximal Assistance - Patient 25 - 49% Assistive device:  (hand rail as well as HR and under L shoulder.) Max distance: 30   Walk 10 feet activity   Assist  Walk 10 feet activity did not occur: Safety/medical concerns  Assist level: Maximal Assistance - Patient 25 - 49% Assistive device:  (hand rail as well as HR and under L shoulder.)   Walk 50 feet activity   Assist Walk 50 feet with 2 turns activity did not occur: Safety/medical concerns         Walk 150 feet activity   Assist Walk 150 feet activity did not occur: Safety/medical concerns         Walk 10 feet on uneven surface  activity   Assist Walk 10 feet on uneven surfaces activity did not occur: Safety/medical concerns          Wheelchair     Assist Is the patient using a wheelchair?: Yes Type of Wheelchair: Manual    Wheelchair assist level: Dependent - Patient 0%      Wheelchair 50 feet with 2 turns activity    Assist        Assist Level: Dependent - Patient 0%   Wheelchair 150 feet activity     Assist      Assist Level: Dependent - Patient 0%   Blood pressure 137/60, pulse (!) 52, temperature 98.3 F (36.8 C), resp. rate 18, SpO2 97%.  Medical Problem List and Plan: 1. 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  Antithrombotics: -DVT/anticoagulation:  Pharmaceutical: Lovenox              -antiplatelet therapy: ASA 3. Pain Management: Tylenol  prn.  4. Mood/Behavior/Sleep: LCSW to follow for evaluation and support.              -antipsychotic agents: N/A 5. Neuropsych/cognition: This patient is capable of making decisions on his own behalf. 6. Skin/Wound Care: LCSW to follow for evaluation and support             --Gerhardt's butt cream to MASD on buttocks             --Diflucan for candida cruris.              --pressure relief measures to left elbow/left heel.  7. Fluids/Electrolytes/Nutrition:  encourage appropriate PO    Latest Ref Rng & Units 07/29/2023    5:36 AM 07/25/2023    5:53 AM 07/22/2023    5:28 AM  BMP  Glucose 70 - 99 mg/dL 657  98  846   BUN 8 - 23 mg/dL 16  20  19    Creatinine 0.61 - 1.24 mg/dL 9.62  9.52  8.41   Sodium 135 - 145 mmol/L 138  143  140   Potassium 3.5 - 5.1 mmol/L 4.0  3.7  4.0   Chloride 98 - 111 mmol/L 106  106  103   CO2 22 - 32 mmol/L 24  23  25    Calcium  8.9 - 10.3 mg/dL 8.5  8.8  8.9      3/24 Cr sl  elevated--might be near baseline 8. T2DM: Hgb A1C- 8.2 (PTA) Was taking Tresiba 54 units in am ( now on Semglee  22U ) and   Metformin  1000 mg--bid (restart Metformin  at 500mg  BID)   CBG (last 3)  Recent Labs    07/29/23 1654 07/29/23 2050 07/30/23 0605  GLUCAP 197* 261* 163*    5/4 increase novolog  to 4u tid and increase to 5U 5/6    -adjust semglee  to 27 u starting 5/5- increase to 30U 5/6 9.   HTN: Monitor BP TID--losartan  50 mg daily--was held for a few days to prevent hypoperfusion             -- parameters to hold prn sitting SBP< 120.  Vitals:   07/29/23 1949 07/30/23 0500  BP: 130/66 137/60  Pulse: 66 (!) 52  Resp: 17 18  Temp: 98.7 F (37.1 C) 98.3 F (36.8 C)  SpO2: 98% 97%    10.  Myasthenia Gravis Mycophenolate  1500/1250mg  --Dr. Franklin Ito at [facility].              Prednisone  5 mg PTA--->has been weaned down to 2 mg daily, monitor for Ptosis and generalized proximal weakness Asymptomatic for MG 5/6 11. Hypothyroid: Stable on levothyroxine  for supplement.  12.  Dyslipidemia: Was on pravastatin 80 mg daily 13. H/o Kidney stones: On Sodium bicarb BID to prevent stones.  14. Constipation: On miralax  daily             --sorbitol  added.   -last bm 5/1--> give sorbitol  today 5/4 15. H/o GERD: Has been off PPI.  16.  Mild anemia check stool guaic on ASA and Lovenox     Latest Ref Rng & Units 07/29/2023    5:36 AM 07/25/2023    5:53 AM 07/22/2023    5:28 AM  CBC  WBC 4.0 - 10.5 K/uL 7.2  7.4  9.6   Hemoglobin 13.0 - 17.0 g/dL 16.1  09.6  04.5   Hematocrit 39.0 - 52.0 % 33.7  34.8  38.3   Platelets 150 - 400 K/uL 346  339  361    Slight drop vs 4/28 but last 2 values stable   LOS: 11 days A FACE TO FACE EVALUATION WAS PERFORMED  Genetta Kenning 07/30/2023, 8:56 AM

## 2023-07-30 NOTE — Plan of Care (Signed)
 Discontinued stair and home ambulation goal as not anticipating pt to be functional ambulator upon discharge.  Problem: RH Ambulation Goal: LTG Patient will ambulate in home environment (PT) Description: LTG: Patient will ambulate in home environment, # of feet with assistance (PT). 07/30/2023 1618 by Jenney Modest, PT Outcome: Not Applicable 07/30/2023 1617 by Jenney Modest, PT Flowsheets (Taken 07/30/2023 1617) LTG: Pt will ambulate in home environ  assist needed:: (discontinued home ambulation goal as not anticipating pt to functional ambulator upon discharge) --   Problem: RH Stairs Goal: LTG Patient will ambulate up and down stairs w/assist (PT) Description: LTG: Patient will ambulate up and down # of stairs with assistance (PT) 07/30/2023 1618 by Jenney Modest, PT Outcome: Not Applicable 07/30/2023 1617 by Jenney Modest, PT Outcome: Not Applicable Flowsheets (Taken 07/30/2023 1617) LTG: Pt will ambulate up/down stairs assist needed:: (discontinued stair goal as not anticipating pt to be functional ambulator upon discharge) --

## 2023-07-30 NOTE — Progress Notes (Signed)
 Occupational Therapy Session Note  Patient Details  Name: [name]. MRN: [id number] Date of Birth: [birth year]  Today's Date: 07/30/2023 OT Individual Time: 1000-1100 OT Individual Time Calculation (min): 60 min    Short Term Goals: Week 1:  OT Short Term Goal 1 (Week 1): Pt will perform sit <> stands at LRAD with MOD A OT Short Term Goal 1 - Progress (Week 1): Met OT Short Term Goal 2 (Week 1): Pt will perform toilet transfer with LRAD and MOD of 1 OT Short Term Goal 2 - Progress (Week 1): Progressing toward goal OT Short Term Goal 3 (Week 1): Pt will improve MMT/ROM of LUE to 2/5 OT Short Term Goal 3 - Progress (Week 1): Met OT Short Term Goal 4 (Week 1): Pt will utilize hemitechniques for dressing consistently with MIN cues OT Short Term Goal 4 - Progress (Week 1): Progressing toward goal  Skilled Therapeutic Interventions/Progress Updates:    Pt received in recliner stating his wife assisted him with bathing and dressing this morning.  Pt eager to spend time on his LUE.  For time effeciency, used stedy lift to move pt from recliner to w/c with CGA.   Pt taken to gym to engage in LUE NMR: -saebo mobile arm support at tension 5 with dynamic arm reaching to touch R shoulder, reach forward, reach over head to 100-120 degrees (without support of MAS, he was only able to lift arm to 50 degrees) - forward motion of shoulder with rolling ball forward and back on table -a/arom for wrist ext with integration of grasp.  He has only 10 degrees of wrist extension and would benefit from continued estim but has skin wounds and foam dressing covering areas that the estim pads would need to go -hand over hand grasping with reaching Due to body habitus, pt having difficulty sitting upright in chair and reaching forward, used firm back supports to keep pt upright.   Pt returned to room and stated he needed the urinal.  Pt stood up in stdy and transferred to recliner. NT arrived to assist pt  with urinal.   Therapy Documentation Precautions:  Precautions Precautions: Fall Precaution/Restrictions Comments: falls, L hemi, mild L sided inattention with L lateral lean Restrictions Weight Bearing Restrictions Per Provider Order: No   Pain: Pain Assessment Pain Scale: 0-10 Pain Score: 0-No pain ADL: ADL Eating: Supervision/safety Grooming: Supervision/safety Upper Body Bathing: Moderate assistance Where Assessed-Upper Body Bathing: Shower Lower Body Bathing: Maximal assistance Where Assessed-Lower Body Bathing: Shower Upper Body Dressing: Moderate assistance Lower Body Dressing: Maximal assistance Toileting: Dependent Where Assessed-Toileting: Teacher, adult education: Dependent (stedy) Film/video editor: Dependent (using a stedy lift) ADL Comments: with stedy lift, pt is able to sit to stand with CGA. Using stedy for safety with ADLs and squat pivots from w/c >< bed or mat.     Therapy/Group: Individual Therapy  Arelly Whittenberg 07/30/2023, 1:11 PM

## 2023-07-30 NOTE — Progress Notes (Signed)
 Speech Language Pathology Daily Session Note  Patient Details  Name: [name]. MRN: [id number] Date of Birth: 1950-06-13  Today's Date: 07/30/2023 SLP Individual Time: 1300-1400 SLP Individual Time Calculation (min): 60 min  Short Term Goals: Week 2: SLP Short Term Goal 1 (Week 2): Patient will complete mildly complex problem solving tasks with supervision multimodal A SLP Short Term Goal 2 (Week 2): Patient will recall and utilize memory strategies given supervision multimodal A SLP Short Term Goal 3 (Week 2): Patient will attend to L visual field during functional tasks given supervision multimodal A  Skilled Therapeutic Interventions: Skilled therapy session focused on cognitive goals. SLP targeted memory and problem solving goals through hospital navigation task. SLP provided written and verbalized destinations for patient to navigate to with use of hospital signage. Patient required occasional minA to attend to the L. Patient recalled all locations visited upon return to room independently. Patient left in chair with alarm set and call bell in reach. Continue POC.   Pain Denies  Therapy/Group: Individual Therapy  [name] M.A., CCC-SLP 07/30/2023, 7:36 AM

## 2023-07-30 NOTE — Progress Notes (Addendum)
 Physical Therapy Session Note  Patient Details  Name: [name]. MRN: [id number] Date of Birth: 1951-02-17  Today's Date: 07/30/2023 PT Individual Time: 0805-0902 PT Individual Time Calculation (min): 57 min   Short Term Goals: Week 1:  PT Short Term Goal 1 (Week 1): Pt will complete bed mobility mod assist PT Short Term Goal 1 - Progress (Week 1): Progressing toward goal PT Short Term Goal 2 (Week 1): Pt will complete sit<>stand transfers with LRAD with min assist PT Short Term Goal 2 - Progress (Week 1): Met PT Short Term Goal 3 (Week 1): Pt will complete bed<>chair transfers with LRAD with mod assist PT Short Term Goal 3 - Progress (Week 1): Progressing toward goal PT Short Term Goal 4 (Week 1): Pt will ambulate with LRAD 57' with mod assist PT Short Term Goal 4 - Progress (Week 1): Progressing toward goal  Skilled Therapeutic Interventions/Progress Updates: Patient sitting in recliner with wife present on entrance to room. Patient alert and agreeable to PT session.   Patient with no complaints of pain during session.  Therapeutic Activity: Transfers: Pt performed sit<>stand transfer from recliner<WC with no AD due to space and with modA/heavy modA to maintain standing balance (to the L). Pt also performed stand pivot transfers with RW and with improved balance with overall minA (to the L). Provided VC for sequence and to ensure B feet flat on floor prior to pivoting. Pt required assistance for RW maintenance.   Gait Training:  Pt ambulated short distance in main gym using RW with L swedish knee cage donned. Pt with improved step pattern as hyperextension was preventing adequate step length. Pt with overall heavy modA to prevent anterior LOB with cues to press weight back down into ankles. Pt also required assistance with RW maintenance  Neuromuscular Re-ed: NMR facilitated during session with focus on weight shifting, coordination, standing balance. - in // bars stepping  to 2" step with L LE and R UE support. Pt required min/modA to maintain standing balance with VC to shift weight accordingly when anteriorly/posteriorly leaning with ankle strategies. Pt provided with 2 rest breaks and and required modA at first to shift weight laterally to advance L LE, then progressed to minA with demonstration provided.   NMR performed for improvements in motor control and coordination, balance, sequencing, judgement, and self confidence/ efficacy in performing all aspects of mobility at highest level of independence.   Patient sitting in WC at end of session with brakes locked, MD present, belt alarm set, and all needs within reach.      Therapy Documentation Precautions:  Precautions Precautions: Fall Precaution/Restrictions Comments: falls, L hemi, mild L sided inattention with L lateral lean Restrictions Weight Bearing Restrictions Per Provider Order: No   Therapy/Group: Individual Therapy  Bethel Sirois PTA 07/30/2023, 12:40 PM

## 2023-07-31 DIAGNOSIS — E1169 Type 2 diabetes mellitus with other specified complication: Secondary | ICD-10-CM | POA: Diagnosis not present

## 2023-07-31 DIAGNOSIS — I63511 Cerebral infarction due to unspecified occlusion or stenosis of right middle cerebral artery: Secondary | ICD-10-CM | POA: Diagnosis not present

## 2023-07-31 DIAGNOSIS — I639 Cerebral infarction, unspecified: Secondary | ICD-10-CM | POA: Diagnosis not present

## 2023-07-31 DIAGNOSIS — G7000 Myasthenia gravis without (acute) exacerbation: Secondary | ICD-10-CM | POA: Diagnosis not present

## 2023-07-31 LAB — GLUCOSE, CAPILLARY
Glucose-Capillary: 165 mg/dL — ABNORMAL HIGH (ref 70–99)
Glucose-Capillary: 217 mg/dL — ABNORMAL HIGH (ref 70–99)
Glucose-Capillary: 236 mg/dL — ABNORMAL HIGH (ref 70–99)
Glucose-Capillary: 184 mg/dL — ABNORMAL HIGH (ref 70–99)

## 2023-07-31 NOTE — Consult Note (Signed)
 Neuropsychological Consultation Comprehensive Inpatient Rehab   Patient:   [name].   DOB:   19-Jan-1951  MR Number:  [id number]  Location:  [facility] [address] CENTER [address] [place] [address] Dept: [phone] Loc: [phone]           Date of Service:   07/30/2023  Start Time:   2 PM End Time:   3 PM  Provider/Observer:  Chapman Commodore, Psy.D.       Clinical Neuropsychologist       Billing Code/Service: [phone]  Reason for Service:    [name]. is a 73 year old male referred for neuropsychological consultation due to coping and adjustment issues with extended hospital stay and currently admitted onto the comprehensive inpatient rehabilitation unit due to functional client requiring min to mod assist with ADLs and mobility.  Patient has a past medical history including type 2 diabetes, hypertension, chronic kidney disease, renal calculi, seropositive myasthenia gravis who has been followed by [facility] for this neurological condition.  Patient was recently admitted to [facility] on 07/14/2023 with 3-day history of stuttering speech and expressive language change with fluctuating left upper extremity weakness with fall.  Double vision was also noted.  Patient had been seen by his treating neurologist prior in the week with recommendation of increasing prednisone  around concerns for MG flare.  Patient was found to have left facial weakness with left upper extremity weakness and mild to moderate dysarthria.  Some symptoms improved after admission and MRI brain revealed acute right MCA infarct involving right periventricular white matter and extending into right frontal and parietal lobes.  MRA also showed focal occlusion and right inferior division of distal M2 MCA.  The patient also had a noted fall and struck his head on 4/23 with follow-up CT showing expansion of territory involving right MCA infarct with  increasing local mass effect and new leftward midline shift of 3 mm.  Patient was ultimately brought to CIR due to functional decline.  During today's clinical interview the patient was alone in his room sitting up in his chair awake and alert.  Patient was able to give a fairly good description of his medical history and events leading to his admission to the hospital.  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  The patient notes that initially had great difficulty with the basic arithmetic and mathematical computations including issues with sequencing and problem-solving.  However, the patient reports that he was able to take his granddaughter through the sequence on the computer screen she was looking at while he was in his hospital bed talking her through it over the phone.  The patient felt that that was a very good sign regarding his improvement.  Patient was in good spirits and denied significant depression or anxiety and expressed an appropriate level of concern regarding the residual effects of his cerebrovascular event and was realistic in his expectations going forward but remaining appropriately hopeful about ongoing and continued improvements.  The patient acknowledged his strong desire to be home as soon as possible but also acknowledged the improvements that he was making and the need for further gains.  The patient reports that he has a worry about being "a burden" on his family and wants to  focus on his many specific improvements around independence and reduced demands being placed on his family postdischarge as possible.  HPI for the current admission:    HPI:  Douglas Wrightis a 73 year old RH male with history of T2DM, HTN, [name], renal calculi, [name] who was admitted to  [facility] on 07/14/23 with 3-4 day history of stuttering speech difficulty and fluctuating LUE weakness with fall. He also reported some double vision and having talked with his neurologist earlier in the week who recommended increasing prednisone  from 2 mg to 10 mg due to concerns of MG flare. He was found to have left facial weakness with LUE weakness and mild to moderate dysarthria. 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 MRA showed focal occlusion of right inferior division of distal M2 MCA. Carotid ultrasound was negative for significant ICA stenosis. 2D echo with bubble study showed EF > 55% with normal RVF and mild AR and trivial TR and normal microcavitation study but with poor sound transmission. BLE dopplers were negative for DVT but limited visualization of posterior tibial and peroneal veins reported.    He did sustain a fall and struck his head on 04/23 and CT head showed expansion of territory involving R-MCA infarct with increasing local mass effect and new leftward midline shift of 3 mm. Follow up CT head 04/24 showed no change in infarct, edema or mass effect ad no bleed. Stroke felt to be cryptogenic in nature and 30 day event monitor recommended. He was maintained on low dose ASA and prednisone  weaned back to 2 mg daily. He has had issues with insomnia,  back pain as well as constipation.  He was independent with use of cane? and working PTA.  Therapy was working with patient who requires min to mod assist with ADLs and mobility. CIR recommended due to functional decline.   Medical History:   Past Medical History:  Diagnosis Date   Anemia    Cervical myelopathy (HCC)    Chronic venous insufficiency    [name] (chronic kidney disease), stage III (HCC)    Diabetes mellitus without complication (HCC)    GERD (gastroesophageal reflux disease)     Hypercholesteremia    Hypothyroidism    Kidney stones    about 50 in the past--last in March 4/24   Leg weakness, bilateral    due to Myasthenia Gravis   Myasthenia gravis (HCC)    Spinal stenosis    Vitamin B 12 deficiency    Vitamin B 12 deficiency    Wears hearing aid    bilateral         Patient Active Problem List   Diagnosis Date Noted   Acute ischemic right MCA stroke (HCC) 07/19/2023   Lymphedema 12/27/2016   Leg pain 07/15/2016   Chronic venous insufficiency 07/15/2016   Swelling of limb 07/15/2016   Diabetes (HCC) 07/15/2016   Hyperlipidemia 07/15/2016    Behavioral Observation/Mental Status:   Cathyann Cobia.  presents as a 73 y.o.-year-old Right handed Caucasian Male who appeared his stated age. his dress was Appropriate and he was Well Groomed and his manners were Appropriate to the situation.  his participation was indicative of Appropriate behaviors.  There were physical disabilities noted.  he displayed an appropriate level of cooperation and motivation.    Interactions:    Active Appropriate  Attention:  abnormal and attention span appeared shorter than expected for age  Memory:   within normal limits; recent and remote memory intact  Visuo-spatial:   not examined  Speech (Volume):  normal  Speech:   normal; normal  Thought Process:  Coherent and Relevant  Coherent, Linear, and Logical  Though Content:  WNL; not suicidal and not homicidal  Orientation:   person, place, time/date, and situation  Judgment:   Good  Planning:   Good  Affect:    Appropriate  Mood:    Dysphoric  Insight:   Good  Intelligence:   high  Psychiatric History:  No prior psychiatric history noted  Family Med/Psych History:  Family History  Problem Relation Age of Onset   Clotting disorder Father    Heart attack Father     Impression/DX:   Rydin Braden. is a 73 year old male referred for neuropsychological consultation due to coping and  adjustment issues with extended hospital stay and currently admitted onto the comprehensive inpatient rehabilitation unit due to functional client requiring min to mod assist with ADLs and mobility.  Patient has a past medical history including type 2 diabetes, hypertension, chronic kidney disease, renal calculi, seropositive myasthenia gravis who has been followed by [facility] for this neurological condition.  Patient was recently admitted to [facility] on 07/14/2023 with 3-day history of stuttering speech and expressive language change with fluctuating left upper extremity weakness with fall.  Double vision was also noted.  Patient had been seen by his treating neurologist prior in the week with recommendation of increasing prednisone  around concerns for MG flare.  Patient was found to have left facial weakness with left upper extremity weakness and mild to moderate dysarthria.  Some symptoms improved after admission and MRI brain revealed acute right MCA infarct involving right periventricular white matter and extending into right frontal and parietal lobes.  MRA also showed focal occlusion and right inferior division of distal M2 MCA.  The patient also had a noted fall and struck his head on 4/23 with follow-up CT showing expansion of territory involving right MCA infarct with increasing local mass effect and new leftward midline shift of 3 mm.  Patient was ultimately brought to CIR due to functional decline.  During today's clinical interview the patient was alone in his room sitting up in his chair awake and alert.  Patient was able to give a fairly good description of his medical history and events leading to his admission to the hospital.  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  The patient notes that initially had  great difficulty with the basic arithmetic and mathematical computations including issues with sequencing and problem-solving.  However, the patient reports that he was able to take his granddaughter through the sequence on the computer screen she was looking at while he was in his hospital bed talking her through it over the phone.  The patient felt that that was a very good sign regarding his improvement.  Patient was in good spirits and denied significant depression or anxiety and expressed an appropriate level of concern regarding the residual effects of his cerebrovascular event and was realistic in his expectations going forward but remaining appropriately hopeful about ongoing and continued improvements.  The patient acknowledged his strong desire to be home as soon as possible but  also acknowledged the improvements that he was making and the need for further gains.  The patient reports that he has a worry about being "a burden" on his family and wants to focus on his many specific improvements around independence and reduced demands being placed on his family postdischarge as possible.  Disposition/Plan:  Today we worked on coping adjustment issues with the patient expected to be discharged in roughly 8 days but team conference happening tomorrow.           Electronically Signed   _______________________ Chapman Commodore, Psy.D. Clinical Neuropsychologist

## 2023-07-31 NOTE — Progress Notes (Signed)
 Speech Language Pathology Daily Session Note  Patient Details  Name: [name]. MRN: [id number] Date of Birth: [birth year]  Today's Date: 07/31/2023 SLP Individual Time: 0900-1000 SLP Individual Time Calculation (min): 60 min  Short Term Goals: Week 2: SLP Short Term Goal 1 (Week 2): Patient will complete mildly complex problem solving tasks with supervision multimodal A SLP Short Term Goal 2 (Week 2): Patient will recall and utilize memory strategies given supervision multimodal A SLP Short Term Goal 3 (Week 2): Patient will attend to L visual field during functional tasks given supervision multimodal A  Skilled Therapeutic Interventions: Skilled therapy session focused on re-evaluation of cognitive skills utilizing the CLQT standardized assessment. Patient scored with overall mild cognitive deficits in the subtests of attention, executive functioning and visual spatial skills. Patient scored WFL on the clock drawing task, language and memory. SLP continued to challenge patient through financial management task. Patient completed addition/subtraction task with minA to check work for accuracy. Patient left in chair with alarm set and call bell in reach. Continue POC  Pain Pain Assessment Pain Score: 0-No pain Faces Pain Scale: No hurt  Therapy/Group: Individual Therapy  Kiora Hallberg M.A., CCC-SLP 07/31/2023, 7:35 AM

## 2023-07-31 NOTE — Progress Notes (Signed)
 Physical Therapy Session Note  Patient Details  Name: [name]. MRN: [id number] Date of Birth: 1951-03-05  Today's Date: 07/31/2023 PT Individual Time: 0805-0900; 1103 - 1135 PT Individual Time Calculation (min): 55 min; 32 min   Short Term Goals: Week 2:  PT Short Term Goal 1 (Week 2): pt will perform bed mobility with mod A with LRAD PT Short Term Goal 2 (Week 2): pt will perform bed to chair transfer with min A with LRAD PT Short Term Goal 3 (Week 2): pt will ambulate 50 feet with LRAD and mod A PT Short Term Goal 4 (Week 2): pt will participate in custom WC eval PT Short Term Goal 5 (Week 2): pt will initiate WC propulsion with hemi technique  SESSION 1 Skilled Therapeutic Interventions/Progress Updates: Patient sitting in recliner on entrance to room. Patient alert and agreeable to PT session.   Patient with no complaints of pain during session. Pt informed of WC evaluation at next possible time as we are getting closer to d/c date. Pt stated home is set up in ADL compliance with ramp. Pt informed that future sessions will continue to have gait interventions and NMRE, but also tied with WC mobility with ramp navigation. Pt understanding. Pt stated that wife can get pt to/from OPPT if pt can transfer from [facility] safely.   Therapeutic Activity: Transfers: Pt performed sit<>stand pivot transfers throughout session with RW and with modA (mod/heavy modA without AD). Provided VC for sequence and weight shift. Pt has tendency to lean heavily to L with cues to shift weight back to center, even with RW.  Gait Training:  Pt ambulated close to 20' using RW with modA at moments, and maxA to prevent anterior LOB at times. Pt with L knee in hyperextension or occasional buckle. Pt progressed in step clearance and with slightly less hyperextension than previous attempts with gait with this session.   Neuromuscular Re-ed: NMR facilitated during session with focus on proprioceptive feedback,  neuromuscular control of L quadriceps. - Mini squats with B UE support in RW with CGA/minA and VC for pt to perform ankle strategies to maintain center balance - Standing march with L LE only and 5lb ankle weight donned and B UE support in RW. Pt with min/heavy minA and VC to keep L LE away from midline (cue to bring laterally towards L).   NMR performed for improvements in motor control and coordination, balance, sequencing, judgement, and self confidence/ efficacy in performing all aspects of mobility at highest level of independence.   Patient sitting in WC at end of session with handoff to SLP.  SESSION 2 Skilled Therapeutic Interventions/Progress Updates: Patient sitting in recliner on entrance to room. Patient alert and agreeable to PT session.   Patient with no complaints of pain during session.   Therapeutic Activity: Bed Mobility: Pt performed sit<supine from EOB with R HOB railing and HOB slightly elevated (pt reported having option to do that at home, and will order railing). Pt required minA to advance L LE through abduction to reach center of bed. Pt able to adjust B shoulders accordingly to maintain hip/shoulder alignment.  Transfers: Pt required multiple attempts to stand this session to RW, ultimately leading to PTA having to stand in front and assisting min/heavy minA to stand (pt showing increased retropulsion this session). Pt then pivoted to the L with overall min/modA (moments of modA when pt would start to lose balance). Pt stand pivot to recliner from EOB with no AD and  with modA with pt requiring increased time to advance L LE through swing to pivot towards recliner, and maxA to prevent anterior LOB. Pt use of HOB railing to assist.   Patient sitting in recliner at end of session with brakes locked, and all needs within reach.       Therapy Documentation Precautions:  Precautions Precautions: Fall Precaution/Restrictions Comments: falls, L hemi, mild L sided  inattention with L lateral lean Restrictions Weight Bearing Restrictions Per Provider Order: No  Therapy/Group: Individual Therapy  Lilliemae Fruge PTA 07/31/2023, 12:38 PM

## 2023-07-31 NOTE — Plan of Care (Signed)
  Problem: Consults Goal: RH STROKE PATIENT EDUCATION Description: See Patient Education module for education specifics  Outcome: Progressing   Problem: RH BOWEL ELIMINATION Goal: RH STG MANAGE BOWEL WITH ASSISTANCE Description: STG Manage Bowel with mod I  Assistance. Outcome: Progressing   Problem: RH BOWEL ELIMINATION Goal: RH STG MANAGE BOWEL W/MEDICATION W/ASSISTANCE Description: STG Manage Bowel with Medication with mod I Assistance. Outcome: Progressing   Problem: RH SAFETY Goal: RH STG ADHERE TO SAFETY PRECAUTIONS W/ASSISTANCE/DEVICE Description: STG Adhere to Safety Precautions With cues Assistance/Device. Outcome: Progressing   Problem: RH PAIN MANAGEMENT Goal: RH STG PAIN MANAGED AT OR BELOW PT'S PAIN GOAL Description: < 4 with prns Outcome: Progressing   Problem: RH KNOWLEDGE DEFICIT Goal: RH STG INCREASE KNOWLEDGE OF DIABETES Description: Patient and spouse will be able to manage DM using educational resources for medications and dietary modification independently Outcome: Progressing   Problem: Coping: Goal: Ability to adjust to condition or change in health will improve Outcome: Progressing   Problem: Education: Goal: Individualized Educational Video(s) Outcome: Progressing   Problem: Education: Goal: Ability to describe self-care measures that may prevent or decrease complications (Diabetes Survival Skills Education) will improve Outcome: Progressing

## 2023-07-31 NOTE — Progress Notes (Signed)
 PROGRESS NOTE   Subjective/Complaints:  Seen in physical therapy this morning.  Has been taking steps with PT mod assist with gait belt followed with wheelchair.  Patient reportedly took more steps this morning.  ROS: Patient denies CP, SOB, N/V/D   Objective:   No results found. Recent Labs    07/29/23 0536  WBC 7.2  HGB 11.1*  HCT 33.7*  PLT 346     Recent Labs    07/29/23 0536  NA 138  K 4.0  CL 106  CO2 24  GLUCOSE 173*  BUN 16  CREATININE 1.25*  CALCIUM  8.5*      Intake/Output Summary (Last 24 hours) at 07/31/2023 0954 Last data filed at 07/31/2023 0735 Gross per 24 hour  Intake 480 ml  Output --  Net 480 ml        Physical Exam: Vital Signs Blood pressure (!) 141/64, pulse (!) 54, temperature 98 F (36.7 C), resp. rate 18, SpO2 96%.  General: No acute distress Mood and affect are appropriate Heart: Regular rate and rhythm no rubs murmurs or extra sounds Lungs: Clear to auscultation, breathing unlabored, no rales or wheezes Abdomen: Positive bowel sounds, soft nontender to palpation, nondistended Extremities: No clubbing, cyanosis, or edema Skin: No evidence of breakdown, no evidence of rash  Neurologic: Cranial nerves II through XII intact, motor strength is 5/5 in right and 3-/5 left deltoid, bicep, tricep,0/5 FE and  2+ grip, 3-hip flexor, knee extensors, ankle dorsiflexor and plantar flexor. Flexor tone in left fingers/wrist.  Gait with mod assist, hyperextension at the left knee does have adequate clearance of the left foot.  No pain with ambulation Cerebellar exam remains limited by weakness on the left side  Prior neuro assessment is c/w today's exam 07/31/2023.  Musculoskeletal: Full passive  range of motion in all 4 extremities. No joint swelling   Assessment/Plan: 1. Functional deficits which require 3+ hours per day of interdisciplinary therapy in a comprehensive inpatient rehab  setting. Physiatrist is providing close team supervision and 24 hour management of active medical problems listed below. Physiatrist and rehab team continue to assess barriers to discharge/monitor patient progress toward functional and medical goals  Care Tool:  Bathing    Body parts bathed by patient: Left arm, Chest, Abdomen, Right upper leg, Left upper leg, Face   Body parts bathed by helper: Right arm, Front perineal area, Buttocks, Right lower leg, Left lower leg     Bathing assist Assist Level: Moderate Assistance - Patient 50 - 74%     Upper Body Dressing/Undressing Upper body dressing   What is the patient wearing?: Pull over shirt    Upper body assist Assist Level: Moderate Assistance - Patient 50 - 74%    Lower Body Dressing/Undressing Lower body dressing      What is the patient wearing?: Pants     Lower body assist Assist for lower body dressing: Maximal Assistance - Patient 25 - 49%     Toileting Toileting    Toileting assist Assist for toileting: Total Assistance - Patient < 25%     Transfers Chair/bed transfer  Transfers assist     Chair/bed transfer assist level: 2 Helpers (MAX  A +2 squat pivot)     Locomotion Ambulation   Ambulation assist   Ambulation activity did not occur: Safety/medical concerns (ambulated 25' with RHR in hallway mod assist and +2 WC follow)  Assist level: Maximal Assistance - Patient 25 - 49% Assistive device:  (hand rail as well as HR and under L shoulder.) Max distance: 30   Walk 10 feet activity   Assist  Walk 10 feet activity did not occur: Safety/medical concerns  Assist level: Maximal Assistance - Patient 25 - 49% Assistive device:  (hand rail as well as HR and under L shoulder.)   Walk 50 feet activity   Assist Walk 50 feet with 2 turns activity did not occur: Safety/medical concerns         Walk 150 feet activity   Assist Walk 150 feet activity did not occur: Safety/medical concerns          Walk 10 feet on uneven surface  activity   Assist Walk 10 feet on uneven surfaces activity did not occur: Safety/medical concerns         Wheelchair     Assist Is the patient using a wheelchair?: Yes Type of Wheelchair: Manual    Wheelchair assist level: Dependent - Patient 0%      Wheelchair 50 feet with 2 turns activity    Assist        Assist Level: Dependent - Patient 0%   Wheelchair 150 feet activity     Assist      Assist Level: Dependent - Patient 0%   Blood pressure (!) 141/64, pulse (!) 54, temperature 98 F (36.7 C), resp. rate 18, SpO2 96%.  Medical Problem List and Plan: 1. 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 Formulized individual treatment plan based on medical history, underlying problem and comorbidities.  May need orthotics consult for hyperextension at the knee if this does not improve 2.  Antithrombotics: -DVT/anticoagulation:  Pharmaceutical: Lovenox              -antiplatelet therapy: ASA 3. Pain Management: Tylenol  prn.  4. Mood/Behavior/Sleep: LCSW to follow for evaluation and support.              -antipsychotic agents: N/A 5. Neuropsych/cognition: This patient is capable of making decisions on his own behalf. 6. Skin/Wound Care: LCSW to follow for evaluation and support             --Gerhardt's butt cream to MASD on buttocks             --Diflucan for candida cruris.              --pressure relief measures to left elbow/left heel.  7. Fluids/Electrolytes/Nutrition:  encourage appropriate PO    Latest Ref Rng & Units 07/29/2023    5:36 AM 07/25/2023    5:53 AM 07/22/2023    5:28 AM  BMP   Glucose 70 - 99 mg/dL 846  98  962   BUN 8 - 23 mg/dL 16  20  19    Creatinine 0.61 - 1.24  mg/dL 1.61  0.96  0.45   Sodium 135 - 145 mmol/L 138  143  140   Potassium 3.5 - 5.1 mmol/L 4.0  3.7  4.0   Chloride 98 - 111 mmol/L 106  106  103   CO2 22 - 32 mmol/L 24  23  25    Calcium  8.9 - 10.3 mg/dL 8.5  8.8  8.9      4/09 Cr sl elevated--might be near baseline 8. T2DM: Hgb A1C- 8.2 (PTA) Was taking Tresiba 54 units in am ( now on Semglee  22U ) and  Metformin  1000 mg--bid (restart Metformin  at 500mg  BID)   CBG (last 3)  Recent Labs    07/30/23 1656 07/30/23 2141 07/31/23 0625  GLUCAP 194* 153* 165*    5/4 increase novolog  to 4u tid and increase to 5U 5/6    -adjust semglee  to 27 u starting 5/5- increase to 30U 5/6 9.   HTN: Monitor BP TID--losartan  50 mg daily--was held for a few days to prevent hypoperfusion             -- parameters to hold prn sitting SBP< 120.  Vitals:   07/30/23 2008 07/31/23 0524  BP: (!) 122/56 (!) 141/64  Pulse:  (!) 54  Resp: 16 18  Temp: 98.2 F (36.8 C) 98 F (36.7 C)  SpO2: 96% 96%    10.  Myasthenia Gravis Mycophenolate  1500/1250mg  --Dr. Franklin Ito at [facility].              Prednisone  5 mg PTA--->has been weaned down to 2 mg daily, monitor for Ptosis and generalized proximal weakness Asymptomatic for MG 5/6 11. Hypothyroid: Stable on levothyroxine  for supplement.  12.  Dyslipidemia: Was on pravastatin 80 mg daily 13. H/o Kidney stones: On Sodium bicarb BID to prevent stones.  14. Constipation: On miralax  daily             --sorbitol  added.   -last bm 5/1--> give sorbitol  today 5/4 15. H/o GERD: Has been off PPI.  16.  Mild anemia check stool guaic on ASA and Lovenox     Latest Ref Rng & Units 07/29/2023    5:36 AM 07/25/2023    5:53 AM 07/22/2023    5:28 AM  CBC  WBC 4.0 - 10.5 K/uL 7.2  7.4  9.6   Hemoglobin 13.0 - 17.0 g/dL 81.1  91.4  78.2   Hematocrit 39.0 - 52.0 % 33.7  34.8  38.3   Platelets 150 - 400 K/uL 346  339  361    Slight drop vs 4/28  but last 2 values stable   LOS: 12 days A FACE TO FACE EVALUATION WAS PERFORMED  Genetta Kenning 07/31/2023, 9:54 AM

## 2023-07-31 NOTE — Progress Notes (Signed)
 Occupational Therapy Session Note  Patient Details  Name: [name]. MRN: [id number] Date of Birth: April 24, 1950  Today's Date: 07/31/2023 OT Individual Time: 1135-1205 OT Individual Time Calculation (min): 30 min    Short Term Goals: Week 1:  OT Short Term Goal 1 (Week 1): Pt will perform sit <> stands at LRAD with MOD A OT Short Term Goal 1 - Progress (Week 1): Met OT Short Term Goal 2 (Week 1): Pt will perform toilet transfer with LRAD and MOD of 1 OT Short Term Goal 2 - Progress (Week 1): Progressing toward goal OT Short Term Goal 3 (Week 1): Pt will improve MMT/ROM of LUE to 2/5 OT Short Term Goal 3 - Progress (Week 1): Met OT Short Term Goal 4 (Week 1): Pt will utilize hemitechniques for dressing consistently with MIN cues OT Short Term Goal 4 - Progress (Week 1): Progressing toward goal Week 2:  OT Short Term Goal 1 (Week 2): Pt will complete toilet transfers with mod A or less using squat or stand pivot. OT Short Term Goal 2 (Week 2): Pt will don shirt with min A using hemi dressing techniques using L ARM as an active assist. OT Short Term Goal 3 (Week 2): Pt will be able to hold L arm above shoulder height for 10 + seconds to be able to wash under arm. OT Short Term Goal 4 (Week 2): Pt will stand and pull pants over hips with 50% or less assist.  Skilled Therapeutic Interventions/Progress Updates:    Pt received in recliner after finishing 2 PT sessions and stated his legs were tired.  Focused on LUE NMR this session. Pt worked on [employer] using Doctor, general practice with guiding A.  With isometric arm extension holds and slow eccentric lowering.   B hands on yoga block with coban used to stabilize fingers on block as pt has hypertone in fingers. B hands reaching forward and back focusing on elb flexion and extension.  Planned with pt to have him scheduled in AM for a shower to work on his ADL skills.  Also discussed possible extension of his LOS based on discussion in his team  conference.  Pt is eager to leave but very willing to stay longer to try to reach a higher level of independence.  Pt stated that he has a roll in shower at home that a w/c can be moved close to the built in bench.  He has grab bars all around the shower.  His toilet at home as a bar on the R wall similar to the hospital room set up.  This next week in therapy he will focus on stand pivots to his R with the bar to toilet/ tub bench and the back to [facility] to his L.   Pt would also like information on a stedy lift that his wife could use for times he is fatigued. Will provide that information tomorrow.  Pt resting in recliner with all needs met.   Therapy Documentation Precautions:  Precautions Precautions: Fall Precaution/Restrictions Comments: falls, L hemi, mild L sided inattention with L lateral lean Restrictions Weight Bearing Restrictions Per Provider Order: No    Vital Signs: Therapy Vitals Temp: 98 F (36.7 C) Pulse Rate: (!) 54 Resp: 18 BP: (!) 141/64 Patient Position (if appropriate): Lying Oxygen Therapy SpO2: 96 % O2 Device: Room Air Pain: Pain Assessment Pain Score: 0-No pain Faces Pain Scale: No hurt ADL: ADL Eating: Supervision/safety Grooming: Supervision/safety Upper Body Bathing: Moderate assistance Where Assessed-Upper  Body Bathing: Shower Lower Body Bathing: Maximal assistance Where Assessed-Lower Body Bathing: Shower Upper Body Dressing: Moderate assistance Lower Body Dressing: Maximal assistance Toileting: Dependent Where Assessed-Toileting: Teacher, adult education: Dependent (stedy) Film/video editor: Dependent (using a stedy lift) ADL Comments: with stedy lift, pt is able to sit to stand with CGA. Using stedy for safety with ADLs and squat pivots from w/c >< bed or mat.   Therapy/Group: Individual Therapy  Arushi Partridge 07/31/2023, 9:15 AM

## 2023-07-31 NOTE — Patient Care Conference (Signed)
 Inpatient RehabilitationTeam Conference and Plan of Care Update Date: 07/31/2023   Time: 10:26 AM    Patient Name: [name].      Medical Record Number: [id number]  Date of Birth: [birth year] Sex: Male         Room/Bed: 4W22C/4W22C-01 Payor Info: Payor: [employer]vertising copy[employer] MEDICARE / Plan: [facility] MEDICARE / Product Type: *No Product type* /    Admit Date/Time:  07/19/2023 12:07 PM  Primary Diagnosis:  Acute ischemic right MCA stroke [facility])  Hospital Problems: Principal Problem:   Acute ischemic right MCA stroke [facility])    Expected Discharge Date: Expected Discharge Date: 08/08/23  Team Members Present: Physician leading conference: Dr. Janeece Mechanic Nurse Present: Forrestine Ike, RN PT Present: Gita Lamb, PT OT Present: Kenda Paula, OT SLP Present: Reggie Caper, SLP PPS Coordinator present : Jestine Moron, SLP     Current Status/Progress Goal Weekly Team Focus  Bowel/Bladder   Continent of bowel and bladder. LBm 5/5   Pt remains free from s/s of constipation   Toileting q 2 hours during day and q4 at hs.    Swallow/Nutrition/ Hydration               ADL's   progressing with transfers with mod-max A of 1,  continuing to develop movement in LUE but not able to integrate functionally, continues to require max A overall with self care   Min A overall   ADL training, LUE NMR, mobility, pt education    Mobility   Bed mobility - mod/maxA, Transfer min/modA (to the L - still progressing). Gait - still maxA with heavy anterior lean - still developing   CGA/minA transfers, supervision bed mobility, minA ambulation and modI WC propulsion  Barriers = L hyperextension/buckling; focus - Pt/family ed, gait, transfers, bed mobility, WC propulsion    Communication                Safety/Cognition/ Behavioral Observations  minA   supervision   mildly complex problem solving, attention, memory    Pain   C/O of back pain   Pt states pain level <3 out of 10.    Assess pain q shfit and PRN. Administer medication per order.    Skin   Multiple abrasions to bilateral forearms, ellbow and L shin, dressing all changed 5/5 and 5/6   Remain free from S/s of infection.  Assess skin q shiftt and PRN. Change dressign per order.      Discharge Planning:  D/c to home with his wife. SW will confirm there are no barriersto discharge.   Team Discussion: Patient post right MCA CVA with history of MG.  Limited by cognitive deficits and challenging gait with anterior lean and hemiparesis with hyperextension of knee.  Patient on target to meet rehab goals: yes, currently making progress with left upper extremity recovery.  Practicing sit - stand transfers. Needs mod - max assist for transfers and min assist for gait.  Requires min assist for complex problem solving and left inattention.   *See Care Plan and progress notes for long and short-term goals.   Revisions to Treatment Plan:  Swedish knee cage trial Neuro psych referral   Teaching Needs: Safety, medications, transfers,toileting, etc.   Current Barriers to Discharge: Decreased caregiver support and Home enviroment access/layout  Possible Resolutions to Barriers: Family education Ramp for entry to home [facility] follow up services DME: [facility]     Medical Summary Current Status: diabetes control improving , morbid obesity  I attest that I was present, lead the team conference, and concur with the assessment and plan of the team.   Forrestine Ike B 07/31/2023, 4:06 PM

## 2023-07-31 NOTE — Progress Notes (Signed)
 Patient ID: [name]., male   DOB: 02-15-1951, 73 y.o.   MRN: [id number]  SW met with pt in room to provid updates from team conference, and discussion about extension due to gains made. Pt in agreement with change in date from 5/15 to 5/20. Pt aware SW will follow-up with his wife.   Norval Been, MSW, LCSW Office: [phone] Cell: [phone] Fax: [phone]

## 2023-08-01 DIAGNOSIS — E1169 Type 2 diabetes mellitus with other specified complication: Secondary | ICD-10-CM | POA: Diagnosis not present

## 2023-08-01 DIAGNOSIS — I639 Cerebral infarction, unspecified: Secondary | ICD-10-CM | POA: Diagnosis not present

## 2023-08-01 DIAGNOSIS — I63511 Cerebral infarction due to unspecified occlusion or stenosis of right middle cerebral artery: Secondary | ICD-10-CM | POA: Diagnosis not present

## 2023-08-01 DIAGNOSIS — G7000 Myasthenia gravis without (acute) exacerbation: Secondary | ICD-10-CM | POA: Diagnosis not present

## 2023-08-01 LAB — GLUCOSE, CAPILLARY
Glucose-Capillary: 121 mg/dL — ABNORMAL HIGH (ref 70–99)
Glucose-Capillary: 148 mg/dL — ABNORMAL HIGH (ref 70–99)
Glucose-Capillary: 160 mg/dL — ABNORMAL HIGH (ref 70–99)
Glucose-Capillary: 169 mg/dL — ABNORMAL HIGH (ref 70–99)

## 2023-08-01 LAB — BASIC METABOLIC PANEL WITH GFR
Anion gap: 9 (ref 5–15)
BUN: 16 mg/dL (ref 8–23)
CO2: 24 mmol/L (ref 22–32)
Calcium: 8.8 mg/dL — ABNORMAL LOW (ref 8.9–10.3)
Chloride: 107 mmol/L (ref 98–111)
Creatinine, Ser: 1.24 mg/dL (ref 0.61–1.24)
GFR, Estimated: >60 mL/min (ref >60)
Glucose, Bld: 101 mg/dL — ABNORMAL HIGH (ref 70–99)
Potassium: 3.6 mmol/L (ref 3.5–5.1)
Sodium: 140 mmol/L (ref 135–145)

## 2023-08-01 LAB — CBC
HCT: 35.4 % — ABNORMAL LOW (ref 39.0–52.0)
Hemoglobin: 11.3 g/dL — ABNORMAL LOW (ref 13.0–17.0)
MCH: 30.1 pg (ref 26.0–34.0)
MCHC: 31.9 g/dL (ref 30.0–36.0)
MCV: 94.4 fL (ref 80.0–100.0)
Platelets: 407 10*3/uL — ABNORMAL HIGH (ref 150–400)
RBC: 3.75 MIL/uL — ABNORMAL LOW (ref 4.22–5.81)
RDW: 12.9 % (ref 11.5–15.5)
WBC: 7.1 10*3/uL (ref 4.0–10.5)
nRBC: 0 % (ref 0.0–0.2)

## 2023-08-01 MED ORDER — INSULIN ASPART 100 UNIT/ML IJ SOLN
6.0000 [IU] | Freq: Three times a day (TID) | INTRAMUSCULAR | Status: DC
  Administered 2023-08-01 – 2023-08-13 (×36): 6 [IU] via SUBCUTANEOUS

## 2023-08-01 NOTE — Progress Notes (Signed)
 Physical Therapy Session Note  Patient Details  Name: [name]. MRN: [id number] Date of Birth: [birth year]  Today's Date: 08/01/2023 PT Individual Time: 0951-1105 PT Individual Time Calculation (min): 74 min   Short Term Goals: Week 2:  PT Short Term Goal 1 (Week 2): pt will perform bed mobility with mod A with LRAD PT Short Term Goal 2 (Week 2): pt will perform bed to chair transfer with min A with LRAD PT Short Term Goal 3 (Week 2): pt will ambulate 50 feet with LRAD and mod A PT Short Term Goal 4 (Week 2): pt will participate in custom WC eval PT Short Term Goal 5 (Week 2): pt will initiate WC propulsion with hemi technique  Skilled Therapeutic Interventions/Progress Updates: Patient sitting in WC on entrance to room. Patient alert and agreeable to PT session.   Patient with no complaints of pain. PTA looked for wider hemi WC to initiate WC propulsion (none located to fit pt - WC eval soon to follow).   Therapeutic Activity: Transfers: Pt performed sit<>stand pivot transfer with no AD and with modA to the R in room from recliner <recliner due to space, and minA with RW to the L from WC<edge of mat. Pt required VC for weight shift, step length during pivot, and to take time to assess balance when noticing it shift.  Gait Training:  Pt ambulated 18' x 2 with seated rest required using RW with L Swedish knee cage donned. Pt with improved gait pattern due to decrease hyperextension L knee. Pt with overall modA with some moments of seemingly increased assistance to prevent LOB to L. Pt required VC to weight shift accordingly, specifically to the R in order to advance L LE through swing. Pt also required VC to increase step length and to maintain wider BOS vs narrow presentation. Pt able to clear L LE through swing phase greater with cage donned vs without. Will continue ambulation with this in future sessions.   Neuromuscular Re-ed: NMR facilitated during session with focus on  neuromuscular connection/control and proprioceptive feedback. - Pt sitting in WC with red theraband donned around upper trunk with rehab tech pulling into hip extension. Pt cued to flex forward, reaching to PTA hand anteriorly to the L in order to increase L hemibody trunk lateral flexors/abdominals. Pt performed until close to fatigue with cues to control eccentric (tech increasing resistance on pull back) with PTA providing HHA (decreasing pt's reliance to pull trunk forward by PTA moving hold closer to pt). 2 rounds performed.  - Pt performed mini squats B UE support in RW with CGA/minA and cues to maintain soft bend in L knee. Pt continues to demo improved control. Pt ambulated short distance following (few steps), but with hyperextension still noted (Swedish knee cage donned following).   NMR performed for improvements in motor control and coordination, balance, sequencing, judgement, and self confidence/ efficacy in performing all aspects of mobility at highest level of independence.   Patient sitting in WC at end of session with brakes locked, and all needs within reach.      Therapy Documentation Precautions:  Precautions Precautions: Fall Precaution/Restrictions Comments: falls, L hemi, mild L sided inattention with L lateral lean Restrictions Weight Bearing Restrictions Per Provider Order: No  Therapy/Group: Individual Therapy  Jomo Forand PTA 08/01/2023, 12:24 PM

## 2023-08-01 NOTE — Progress Notes (Signed)
 Patient ID: [name]., male   DOB: [birth year], 73 y.o.   MRN: [id number]   1103- SW spoke with pt wife to provide updates from team conference, and change in d/c dre to 5/20. She will come in for family edu on 5/16 8am-11am since she spends the night here with him.    Norval Been, MSW, LCSW Office: [phone] Cell: [phone] Fax: [phone]

## 2023-08-01 NOTE — Progress Notes (Signed)
 PROGRESS NOTE   Subjective/Complaints:  No issues overnite , discussed revised d/c date and specific goals  ROS: Patient denies CP, SOB, N/V/D   Objective:   No results found. No results for input(s): "WBC", "HGB", "HCT", "PLT" in the last 72 hours.    No results for input(s): "NA", "K", "CL", "CO2", "GLUCOSE", "BUN", "CREATININE", "CALCIUM " in the last 72 hours.     Intake/Output Summary (Last 24 hours) at 08/01/2023 0900 Last data filed at 08/01/2023 1027 Gross per 24 hour  Intake 720 ml  Output 400 ml  Net 320 ml        Physical Exam: Vital Signs Blood pressure (!) 149/65, pulse (!) 59, temperature 98.4 F (36.9 C), resp. rate 18, weight 127.7 kg, SpO2 95%.  General: No acute distress Mood and affect are appropriate Heart: Regular rate and rhythm no rubs murmurs or extra sounds Lungs: Clear to auscultation, breathing unlabored, no rales or wheezes Abdomen: Positive bowel sounds, soft nontender to palpation, nondistended Extremities: No clubbing, cyanosis, or edema Skin: No evidence of breakdown, no evidence of rash  Neurologic: Cranial nerves II through XII intact, motor strength is 5/5 in right and 3-/5 left deltoid, bicep, tricep,0/5 FE and  2+ grip, 3-hip flexor, knee extensors, ankle dorsiflexor and plantar flexor. Flexor tone in left fingers/wrist.  Gait with mod assist, hyperextension at the left knee does have adequate clearance of the left foot.  No pain with ambulation Cerebellar exam remains limited by weakness on the left side  Prior neuro assessment is c/w today's exam 08/01/2023.  Musculoskeletal: Full passive  range of motion in all 4 extremities. No joint swelling   Assessment/Plan: 1. Functional deficits which require 3+ hours per day of interdisciplinary therapy in a comprehensive inpatient rehab setting. Physiatrist is providing close team supervision and 24 hour management of active medical  problems listed below. Physiatrist and rehab team continue to assess barriers to discharge/monitor patient progress toward functional and medical goals  Care Tool:  Bathing    Body parts bathed by patient: Left arm, Chest, Abdomen, Right upper leg, Left upper leg, Face   Body parts bathed by helper: Right arm, Front perineal area, Buttocks, Right lower leg, Left lower leg     Bathing assist Assist Level: Moderate Assistance - Patient 50 - 74%     Upper Body Dressing/Undressing Upper body dressing   What is the patient wearing?: Pull over shirt    Upper body assist Assist Level: Moderate Assistance - Patient 50 - 74%    Lower Body Dressing/Undressing Lower body dressing      What is the patient wearing?: Pants     Lower body assist Assist for lower body dressing: Maximal Assistance - Patient 25 - 49%     Toileting Toileting    Toileting assist Assist for toileting: Total Assistance - Patient < 25%     Transfers Chair/bed transfer  Transfers assist     Chair/bed transfer assist level: 2 Helpers (MAX A +2 squat pivot)     Locomotion Ambulation   Ambulation assist   Ambulation activity did not occur: Safety/medical concerns (ambulated 33' with RHR in hallway mod assist and +2 WC follow)  Assist level: Maximal Assistance - Patient 25 - 49% Assistive device:  (hand rail as well as HR and under L shoulder.) Max distance: 30   Walk 10 feet activity   Assist  Walk 10 feet activity did not occur: Safety/medical concerns  Assist level: Maximal Assistance - Patient 25 - 49% Assistive device:  (hand rail as well as HR and under L shoulder.)   Walk 50 feet activity   Assist Walk 50 feet with 2 turns activity did not occur: Safety/medical concerns         Walk 150 feet activity   Assist Walk 150 feet activity did not occur: Safety/medical concerns         Walk 10 feet on uneven surface  activity   Assist Walk 10 feet on uneven surfaces  activity did not occur: Safety/medical concerns         Wheelchair     Assist Is the patient using a wheelchair?: Yes Type of Wheelchair: Manual    Wheelchair assist level: Dependent - Patient 0%      Wheelchair 50 feet with 2 turns activity    Assist        Assist Level: Dependent - Patient 0%   Wheelchair 150 feet activity     Assist      Assist Level: Dependent - Patient 0%   Blood pressure (!) 149/65, pulse (!) 59, temperature 98.4 F (36.9 C), resp. rate 18, weight 127.7 kg, SpO2 95%.  Medical Problem List and Plan: 1. 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  Antithrombotics: -DVT/anticoagulation:  Pharmaceutical: Lovenox              -antiplatelet therapy: ASA 3. Pain Management: Tylenol  prn.  4. Mood/Behavior/Sleep: LCSW to follow for evaluation and support.              -antipsychotic agents: N/A 5. Neuropsych/cognition: This patient is capable of making decisions on his own behalf. 6. Skin/Wound Care: LCSW to follow for evaluation and support             --Gerhardt's butt cream to MASD on buttocks             --Diflucan for candida cruris.              --pressure relief measures to left elbow/left heel.  7. Fluids/Electrolytes/Nutrition:  encourage appropriate PO    Latest Ref Rng & Units 07/29/2023    5:36 AM 07/25/2023    5:53 AM 07/22/2023    5:28 AM  BMP  Glucose 70 - 99 mg/dL 161  98  096   BUN 8 - 23 mg/dL 16  20  19    Creatinine 0.61 - 1.24 mg/dL 0.45  4.09  8.11   Sodium 135 - 145 mmol/L 138  143  140   Potassium 3.5 - 5.1 mmol/L 4.0  3.7  4.0   Chloride 98 - 111 mmol/L 106  106  103   CO2 22 - 32 mmol/L 24  23  25    Calcium  8.9 - 10.3 mg/dL 8.5  8.8  8.9  4/28 Cr sl elevated--might be near baseline 8. T2DM: Hgb A1C- 8.2 (PTA) Was taking Tresiba 54 units in am ( now on Semglee  22U ) and  Metformin  1000 mg--bid (restart Metformin  at 500mg  BID)   CBG (last 3)  Recent Labs    07/31/23 1624 07/31/23 2059 08/01/23 0612  GLUCAP 236* 184* 121*    5/4 increase novolog  to 4u tid and increase to 5U 5/6    -adjust semglee  to 27 u starting 5/5- increase to 30U 5/6 AM CBGs are in range but daytime elevated , increase novalog to 6U TID 9.   HTN: Monitor BP TID--losartan  50 mg daily--was held for a few days to prevent hypoperfusion             -- parameters to hold prn sitting SBP< 120.  Vitals:   07/31/23 2031 08/01/23 0614  BP: (!) 120/55 (!) 149/65  Pulse: 71 (!) 59  Resp: 18 18  Temp: 98.4 F (36.9 C) 98.4 F (36.9 C)  SpO2: 95% 95%    10.  Myasthenia Gravis Mycophenolate  1500/1250mg  --Dr. Franklin Ito at [facility].              Prednisone  5 mg PTA--->has been weaned down to 2 mg daily, monitor for Ptosis and generalized proximal weakness Asymptomatic for MG 5/6 11. Hypothyroid: Stable on levothyroxine  for supplement.  12.  Dyslipidemia: Was on pravastatin 80 mg daily 13. H/o Kidney stones: On Sodium bicarb BID to prevent stones.  14. Constipation: On miralax  daily             --sorbitol  added.   -last bm 5/1--> give sorbitol  today 5/4 15. H/o GERD: Has been off PPI.  16.  Mild anemia check stool guaic on ASA and Lovenox     Latest Ref Rng & Units 07/29/2023    5:36 AM 07/25/2023    5:53 AM 07/22/2023    5:28 AM  CBC  WBC 4.0 - 10.5 K/uL 7.2  7.4  9.6   Hemoglobin 13.0 - 17.0 g/dL 78.4  69.6  29.5   Hematocrit 39.0 - 52.0 % 33.7  34.8  38.3   Platelets 150 - 400 K/uL 346  339  361    Slight drop vs 4/28 but last 2 values stable   LOS: 13 days A FACE TO FACE EVALUATION WAS PERFORMED  [name] [name] 08/01/2023, 9:00 AM

## 2023-08-01 NOTE — Progress Notes (Signed)
 Occupational Therapy Weekly Progress Note  Patient Details  Name: [name]. MRN: [id number] Date of Birth: Feb 21, 1951  Beginning of progress report period: Jul 26, 2023 End of progress report period: Aug 01, 2023  Today's Date: 08/01/2023 OT Individual Time: 0830-0930 and 1305-1400 OT Individual Time Calculation (min): 60 min and 55 min    Patient has met 4 of 4 short term goals.  Pt is making strong gains in AROM of L shoulder and elbow but only has a trace amount of finger flexion.  He is also progressing with his sit to stands and standing balance to enable him to be more independent with self care.   Patient continues to demonstrate the following deficits: abnormal tone, unbalanced muscle activation, and decreased coordination, decreased attention to left, decreased memory and delayed processing, and decreased sitting balance, decreased standing balance, decreased postural control, hemiplegia, and decreased balance strategies and therefore will continue to benefit from skilled OT intervention to enhance overall performance with BADL.  Patient progressing toward long term goals..  Continue plan of care.  OT Short Term Goals Week 1:  OT Short Term Goal 1 (Week 1): Pt will perform sit <> stands at LRAD with MOD A OT Short Term Goal 1 - Progress (Week 1): Met OT Short Term Goal 2 (Week 1): Pt will perform toilet transfer with LRAD and MOD of 1 OT Short Term Goal 2 - Progress (Week 1): Progressing toward goal OT Short Term Goal 3 (Week 1): Pt will improve MMT/ROM of LUE to 2/5 OT Short Term Goal 3 - Progress (Week 1): Met OT Short Term Goal 4 (Week 1): Pt will utilize hemitechniques for dressing consistently with MIN cues OT Short Term Goal 4 - Progress (Week 1): Progressing toward goal Week 2:  OT Short Term Goal 1 (Week 2): Pt will complete toilet transfers with mod A or less using squat or stand pivot. OT Short Term Goal 1 - Progress (Week 2): Met OT Short Term Goal 2 (Week  2): Pt will don shirt with min A using hemi dressing techniques using L ARM as an active assist. OT Short Term Goal 2 - Progress (Week 2): Met OT Short Term Goal 3 (Week 2): Pt will be able to hold L arm above shoulder height for 10 + seconds to be able to wash under arm. OT Short Term Goal 3 - Progress (Week 2): Met OT Short Term Goal 4 (Week 2): Pt will stand and pull pants over hips with 50% or less assist. OT Short Term Goal 4 - Progress (Week 2): Met Week 3:  OT Short Term Goal 1 (Week 3): Pt will don a shirt with supervision. OT Short Term Goal 2 (Week 3): Pt will be able to pull pants over hips with 20% help or less. OT Short Term Goal 3 (Week 3): Pt will complete stand pivot transfers to toilet with RW with min A.  Skilled Therapeutic Interventions/Progress Updates:    Visit 1: NO PAIN   Pt seen for BADL retraining of toileting, showering, and dressing with a focus on using adaptive strategies with long sponge, reacher, and hemidressing techniques. He continues to have great difficulty with sit to stands from low recliner but is able to stand up from elevated bed fairly easily with min A to his RW.  Improved stand balance with L hand only on RW to use R hand with LB tasks.  ADL Eating: Supervision/safety Grooming: Supervision/safety Upper Body Bathing: Moderate assistance Where Assessed-Upper Body  Bathing: Shower Lower Body Bathing: Maximal assistance Where Assessed-Lower Body Bathing: Shower Upper Body Dressing: Moderate assistance Lower Body Dressing: Maximal assistance Toileting: Dependent Where Assessed-Toileting: Teacher, adult education: Dependent (stedy) Film/video editor: Dependent (using a stedy lift) ADL Comments: with stedy lift, pt is able to sit to stand with CGA. Using stedy for safety with ADLs and squat pivots from w/c >< bed or mat. Pt resting in wc with all needs met at end of session.  Visit 2:  no c/o pain  Pt received in recliner and ready for therapy.  Pillows placed behind his back for more upright sitting posture.   Moved areas of foam dressing to allow for space on forearm to place estim pads.  Pt has poor sensation and did not have a motor response with finger extension until it reached an intensity of 40.  Checked skin after 10 min to ensure he would not have adverse reaction, no issues so continued estim for 20 more minutes. Pt unable to feel his fingers moving, cued pt to look at his fingers moving and then try to extend them further.  Pt focused on extending his fingers but did need several reminders to keep his attention on his hand. He continues to have some L inattention.  During this time, found a stedy lift on [employer] and printed out information for pt to share with his spouse. Pt is progressing well with RW transfers but this may be a good option for when pt needs to toilet or access his roll in shower.   For NMR,  facilitated a/arom of LUE for pushing and pulling arm and reaching L hand to R shoulder and then to L knee.  Pt continues to develop proximal motor control but very limited distal.   Pt resting in recliner with all needs met.   Therapy Documentation Precautions:  Precautions Precautions: Fall Precaution/Restrictions Comments: falls, L hemi, mild L sided inattention with L lateral lean Restrictions Weight Bearing Restrictions Per Provider Order: No    Vital Signs: Therapy Vitals Temp: 98.4 F (36.9 C) Pulse Rate: (!) 59 Resp: 18 BP: (!) 149/65 Patient Position (if appropriate): Lying Oxygen Therapy SpO2: 95 % O2 Device: Room Air       Therapy/Group: Individual Therapy  Treveon Bourcier 08/01/2023, 8:48 AM

## 2023-08-02 DIAGNOSIS — I63511 Cerebral infarction due to unspecified occlusion or stenosis of right middle cerebral artery: Secondary | ICD-10-CM | POA: Diagnosis not present

## 2023-08-02 DIAGNOSIS — E1169 Type 2 diabetes mellitus with other specified complication: Secondary | ICD-10-CM | POA: Diagnosis not present

## 2023-08-02 DIAGNOSIS — G7000 Myasthenia gravis without (acute) exacerbation: Secondary | ICD-10-CM | POA: Diagnosis not present

## 2023-08-02 DIAGNOSIS — I639 Cerebral infarction, unspecified: Secondary | ICD-10-CM | POA: Diagnosis not present

## 2023-08-02 LAB — GLUCOSE, CAPILLARY
Glucose-Capillary: 104 mg/dL — ABNORMAL HIGH (ref 70–99)
Glucose-Capillary: 145 mg/dL — ABNORMAL HIGH (ref 70–99)
Glucose-Capillary: 125 mg/dL — ABNORMAL HIGH (ref 70–99)
Glucose-Capillary: 129 mg/dL — ABNORMAL HIGH (ref 70–99)

## 2023-08-02 NOTE — Progress Notes (Signed)
 Occupational Therapy Session Note  Patient Details  Name: [name]. MRN: [id number] Date of Birth: 11-22-1950  Today's Date: 08/02/2023 OT Individual Time: 7425-9563 OT Individual Time Calculation (min): 80 min    Short Term Goals: Week 3:  OT Short Term Goal 1 (Week 3): Pt will don a shirt with supervision. OT Short Term Goal 2 (Week 3): Pt will be able to pull pants over hips with 20% help or less. OT Short Term Goal 3 (Week 3): Pt will complete stand pivot transfers to toilet with RW with min A.  Skilled Therapeutic Interventions/Progress Updates:     Pt received in recliner ready for therapy.  Focus of therapy session on progressing functional movement of LUE.      Therapeutic Activity/ Exercise: -mirror therapy with pt looking at reflection of R hand in mirror while trying to move L fingers Transfers: -from recliner, pt needed several tries to move into stand as the seat is low and he needs to move forward really bringing his head past his knees.  On 3rd try with mod A to bring torso forward pt able to rise to stand and then put his hands on RW.  To step to L to wc pt needed min/mod A and cues  to keep weight centered.  Pt tended to lean into L hip with feet too close together - cues to step L foot out and visually attend to L side as he has limited sensation.   -min A to stand to sit to w/c with guarding A -sit to stands from w/c to tall table in gym with min A -pt transferred back from wc to recliner with min A with RW with improved LLE control  Balance: -standing at table with cues for L foot placement with min A  Neuromuscular Re-Education:  -standing with L hand weight bearing on table adding in small push ups to focus on elbow extension -pushing through L hand on table with therapist assisting to keep fingers extended due to hypertone in finger flexors as pt slid towel forward and back and side to side -pt tolerated standing for several minutes -seated AROM of  shoulder with sliding towel back and forth on table for sh and elbow motion -he continues to not have finger or wrist extension.   He has wounds on his forearm so there is only a small amount of skin space to place 2 electrodes .  I had to place the pads side by side but they still worked.  He needs high intensity of 40 to get a motor response.  His skin has handled it well with no redness.  Used estim (zynex nextwave) for 30 min. -integrated grasp and release practice.    Pt resting in recliner with all needs met. Alarm set and call light in reach.     Therapy Documentation Precautions:  Precautions Precautions: Fall Precaution/Restrictions Comments: falls, L hemi, mild L sided inattention with L lateral lean Restrictions Weight Bearing Restrictions Per Provider Order: No   Pain:  No c/o pain   ADL: ADL Eating: Set up Grooming: Setup Where Assessed-Grooming: Sitting at sink Upper Body Bathing: Supervision/safety (used long sponge to reach R arm) Where Assessed-Upper Body Bathing: Shower Lower Body Bathing: Minimal assistance Where Assessed-Lower Body Bathing: Shower Upper Body Dressing: Minimal assistance Where Assessed-Upper Body Dressing: Edge of bed Lower Body Dressing: Moderate assistance Where Assessed-Lower Body Dressing: Edge of bed Toileting: Moderate assistance Where Assessed-Toileting: Teacher, adult education: Advertising account executive (stedy) Statistician  Equipment: Raised toilet seat Film/video editor: Contact guard (with a stedy lift) ADL Comments: with stedy lift, pt is able to sit to stand with CGA. Using stedy for safety with ADLs and squat pivots from w/c >< bed or mat.    Therapy/Group: Individual Therapy  Lindalee Huizinga 08/02/2023, 10:01 AM

## 2023-08-02 NOTE — Progress Notes (Signed)
 Physical Therapy Session Note  Patient Details  Name: [name]. MRN: [id number] Date of Birth: [birth year]  Today's Date: 08/02/2023 PT Individual Time: 1308-6578 PT Individual Time Calculation (min): 72 min   Short Term Goals: Week 2:  PT Short Term Goal 1 (Week 2): pt will perform bed mobility with mod A with LRAD PT Short Term Goal 2 (Week 2): pt will perform bed to chair transfer with min A with LRAD PT Short Term Goal 3 (Week 2): pt will ambulate 50 feet with LRAD and mod A PT Short Term Goal 4 (Week 2): pt will participate in custom WC eval PT Short Term Goal 5 (Week 2): pt will initiate WC propulsion with hemi technique  Skilled Therapeutic Interventions/Progress Updates: Patient sitting in recliner on entrance to room. Patient alert and agreeable to PT session.   Patient reported no pain during session.   Therapeutic Activity: Transfers: Pt performed sit<>stand pivot transfer at beginning and end of session from recliner<>WC with modA to the R, and closer to minA to the L (at beginning with RW) with VC for sequence and weight shift.  - Pt participated in multiple bouts of transfers to the L from WC<>edge of mat with RW (L hemi-grip) and minA with some moments of CGA. Pt provided with cues and demonstration of set up (B feet neural BOS on ground, hips forward vs slightly L, B LE adducted - knees closer together vs abducted apart, and increased anterior lean - pt had picture taken on personal phone of mechanics to further increase independency with transfers). Pt also presented with anterior lean with cues to shift weight posteriorly, and to maintain safe proximity to RW when sitting.  Gait Training:  Pt ambulated roughly 64' using 3 musketeer method with maxA. Originally attempted gait with RW but deferred to + 2 due to pt's decreased ability to weight shift this morning, and to produce adequate hip flexion during swing phase. Pt cued to increase step length on B LE's +  widen step vs narrow presentation. Pt with decreased glute engagement to extend LE through end of stance phase, and trunk compensation noted. Pt cued on last 2 transfers to perform without PTA intervening, and only providing feedback after each transfer. Pt provided with questioning cues to ensure B feet are neutrally together vs L LE extended out as this increases hyperextension/retropulsion, to ensure L UE is donned on hemi grip.  Patient sitting in recliner at end of session with brakes locked, chair alarm set, and all needs within reach.      Therapy Documentation Precautions:  Precautions Precautions: Fall Precaution/Restrictions Comments: falls, L hemi, mild L sided inattention with L lateral lean Restrictions Weight Bearing Restrictions Per Provider Order: No   Therapy/Group: Individual Therapy  Chloeanne Poteet PTA 08/02/2023, 12:23 PM

## 2023-08-02 NOTE — Progress Notes (Signed)
 PROGRESS NOTE   Subjective/Complaints:  Had some bowel issues, "not cleaned up good" otherwise ok, wife showered him this am   ROS: Patient denies CP, SOB, N/V/D   Objective:   No results found. Recent Labs    08/01/23 0636  WBC 7.1  HGB 11.3*  HCT 35.4*  PLT 407*      Recent Labs    08/01/23 0636  NA 140  K 3.6  CL 107  CO2 24  GLUCOSE 101*  BUN 16  CREATININE 1.24  CALCIUM  8.8*       Intake/Output Summary (Last 24 hours) at 08/02/2023 0813 Last data filed at 08/01/2023 1839 Gross per 24 hour  Intake 660 ml  Output --  Net 660 ml        Physical Exam: Vital Signs Blood pressure 131/62, pulse (!) 59, temperature 98.1 F (36.7 C), resp. rate 17, weight 127.7 kg, SpO2 95%.  General: No acute distress Mood and affect are appropriate Heart: Regular rate and rhythm no rubs murmurs or extra sounds Lungs: Clear to auscultation, breathing unlabored, no rales or wheezes Abdomen: Positive bowel sounds, soft nontender to palpation, nondistended Extremities: No clubbing, cyanosis, or edema Skin: No evidence of breakdown, no evidence of rash  Neurologic: Cranial nerves II through XII intact, motor strength is 5/5 in right and 3-/5 left deltoid, bicep, tricep,0/5 FE and  2+ grip, 3-hip flexor, knee extensors, ankle dorsiflexor and plantar flexor. Flexor tone in left fingers/wrist.  Gait with mod assist, hyperextension at the left knee does have adequate clearance of the left foot.  No pain with ambulation Cerebellar exam remains limited by weakness on the left side  Prior neuro assessment is c/w today's exam 08/02/2023.  Musculoskeletal: Full passive  range of motion in all 4 extremities. No joint swelling   Assessment/Plan: 1. Functional deficits which require 3+ hours per day of interdisciplinary therapy in a comprehensive inpatient rehab setting. Physiatrist is providing close team supervision and 24 hour  management of active medical problems listed below. Physiatrist and rehab team continue to assess barriers to discharge/monitor patient progress toward functional and medical goals  Care Tool:  Bathing    Body parts bathed by patient: Left arm, Chest, Abdomen, Right upper leg, Left upper leg, Face, Right arm, Front perineal area, Right lower leg, Left lower leg (used long sponge to reach R arm)   Body parts bathed by helper: Buttocks     Bathing assist Assist Level: Minimal Assistance - Patient > 75%     Upper Body Dressing/Undressing Upper body dressing   What is the patient wearing?: Pull over shirt    Upper body assist Assist Level: Minimal Assistance - Patient > 75%    Lower Body Dressing/Undressing Lower body dressing      What is the patient wearing?: Pants     Lower body assist Assist for lower body dressing: Moderate Assistance - Patient 50 - 74% (used reacher to don over feet)     Editor, commissioning assist Assist for toileting: Moderate Assistance - Patient 50 - 74%     Transfers Chair/bed transfer  Transfers assist     Chair/bed transfer assist level:  2 Helpers (MAX A +2 squat pivot)     Locomotion Ambulation   Ambulation assist   Ambulation activity did not occur: Safety/medical concerns (ambulated 25' with RHR in hallway mod assist and +2 WC follow)  Assist level: Maximal Assistance - Patient 25 - 49% Assistive device:  (hand rail as well as HR and under L shoulder.) Max distance: 30   Walk 10 feet activity   Assist  Walk 10 feet activity did not occur: Safety/medical concerns  Assist level: Maximal Assistance - Patient 25 - 49% Assistive device:  (hand rail as well as HR and under L shoulder.)   Walk 50 feet activity   Assist Walk 50 feet with 2 turns activity did not occur: Safety/medical concerns         Walk 150 feet activity   Assist Walk 150 feet activity did not occur: Safety/medical concerns          Walk 10 feet on uneven surface  activity   Assist Walk 10 feet on uneven surfaces activity did not occur: Safety/medical concerns         Wheelchair     Assist Is the patient using a wheelchair?: Yes Type of Wheelchair: Manual    Wheelchair assist level: Dependent - Patient 0%      Wheelchair 50 feet with 2 turns activity    Assist        Assist Level: Dependent - Patient 0%   Wheelchair 150 feet activity     Assist      Assist Level: Dependent - Patient 0%   Blood pressure 131/62, pulse (!) 59, temperature 98.1 F (36.7 C), resp. rate 17, weight 127.7 kg, SpO2 95%.  Medical Problem List and Plan: 1. 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  Antithrombotics: -DVT/anticoagulation:  Pharmaceutical: Lovenox              -antiplatelet therapy: ASA 3. Pain Management: Tylenol  prn.  4. Mood/Behavior/Sleep: LCSW to follow for evaluation and support.              -antipsychotic agents: N/A 5. Neuropsych/cognition: This patient is capable of making decisions on his own behalf. 6. Skin/Wound Care: LCSW to follow for evaluation and support             --Gerhardt's butt cream to MASD on buttocks             --Diflucan for candida cruris.              --pressure relief measures to left elbow/left heel.  7. Fluids/Electrolytes/Nutrition:  encourage appropriate PO    Latest Ref Rng & Units 08/01/2023    6:36 AM 07/29/2023    5:36 AM 07/25/2023    5:53 AM  BMP  Glucose 70 - 99 mg/dL 130  865  98   BUN 8 - 23 mg/dL 16  16  20    Creatinine 0.61 - 1.24 mg/dL 7.84  6.96  2.95   Sodium 135 - 145 mmol/L 140  138  143   Potassium 3.5 - 5.1 mmol/L 3.6  4.0  3.7   Chloride 98 - 111 mmol/L 107   106  106   CO2 22 - 32 mmol/L 24  24  23    Calcium  8.9 - 10.3 mg/dL 8.8  8.5  8.8      1/32 Cr sl elevated--might be near baseline 8. T2DM: Hgb A1C- 8.2 (PTA) Was taking Tresiba 54 units in am ( now on Semglee  22U ) and  Metformin  1000 mg--bid (restart Metformin  at 500mg  BID)   CBG (last 3)  Recent Labs    08/01/23 1638 08/01/23 2017 08/02/23 0622  GLUCAP 160* 169* 104*  Improved 5/8-9  5/4 increase novolog  to 4u tid and increase to 5U 5/6    -adjust semglee  to 27 u starting 5/5- increase to 30U 5/6 AM CBGs are in range but daytime elevated , increase novalog to 6U TID 9.   HTN: Monitor BP TID--losartan  50 mg daily--was held for a few days to prevent hypoperfusion             -- parameters to hold prn sitting SBP< 120.  Vitals:   08/01/23 2015 08/02/23 0528  BP: 102/81 131/62  Pulse: 80 (!) 59  Resp: 16 17  Temp: 97.8 F (36.6 C) 98.1 F (36.7 C)  SpO2: 100% 95%    10.  Myasthenia Gravis Mycophenolate  1500/1250mg  --Dr. Franklin Ito at [facility].              Prednisone  5 mg PTA--->has been weaned down to 2 mg daily, monitor for Ptosis and generalized proximal weakness Asymptomatic for MG 5/6 11. Hypothyroid: Stable on levothyroxine  for supplement.  12.  Dyslipidemia: Was on pravastatin 80 mg daily 13. H/o Kidney stones: On Sodium bicarb BID to prevent stones.  14. Constipation: On miralax  daily             --sorbitol  added.   -last bm 5/1--> give sorbitol  today 5/4 15. H/o GERD: Has been off PPI.  16.  Mild anemia check stool guaic on ASA and Lovenox     Latest Ref Rng & Units 08/01/2023    6:36 AM 07/29/2023    5:36 AM 07/25/2023    5:53 AM  CBC  WBC 4.0 - 10.5 K/uL 7.1  7.2  7.4   Hemoglobin 13.0 - 17.0 g/dL 44.0  10.2  72.5   Hematocrit 39.0 - 52.0 % 35.4  33.7  34.8   Platelets 150 - 400 K/uL 407  346  339    Slight drop vs 4/28 but last 2 values stable   LOS: 14 days A FACE TO FACE EVALUATION WAS PERFORMED  Genetta Kenning 08/02/2023, 8:13 AM

## 2023-08-02 NOTE — Progress Notes (Signed)
 Speech Language Pathology Daily Session Note  Patient Details  Name: [name]. MRN: [id number] Date of Birth: Dec 28, 1950  Today's Date: 08/02/2023 SLP Individual Time: 7829-5621 SLP Individual Time Calculation (min): 40 min  Short Term Goals: Week 2: SLP Short Term Goal 1 (Week 2): Patient will complete mildly complex problem solving tasks with supervision multimodal A SLP Short Term Goal 2 (Week 2): Patient will recall and utilize memory strategies given supervision multimodal A SLP Short Term Goal 3 (Week 2): Patient will attend to L visual field during functional tasks given supervision multimodal A  Skilled Therapeutic Interventions: SLP conducted skilled therapy session targeting cognitive goals. SLP facilitated completion of mildly complex verbally presented problem solving tasks. Patient completed these quickly and with supervision with 90% accuracy. Upped complexity to moderate complexity with patient then taking increased time and achieving 75% accuracy.  In remaining minutes of session, completed puzzle brought in by wife. Puzzle was simple, 24 piece puzzle. Patient required max assist for visuospatial reasoning. Patient was left in room with call bell in reach and alarm set. SLP will continue to target goals per plan of care.        Pain Pain Assessment Pain Scale: 0-10 Pain Score: 0-No pain  Therapy/Group: Individual Therapy  [name], M.A., CCC-SLP  [name] 08/02/2023, 1:43 PM

## 2023-08-02 NOTE — Consult Note (Signed)
 WOC Nurse Consult Note: Reason for Consult: L forearm skin tear  Wound type: full thickness r/t trauma  Pressure Injury POA: NA not pressure  Measurement: see nursing flowsheet  Wound bed: red moist  Drainage (amount, consistency, odor) serosanguinous  Periwound: ecchymosis  Dressing procedure/placement/frequency: Cleanse all skin tears L arm with NS, apply Xeroform gauze (Lawson [phone]) to wound beds daily, cover with Telfa nonstick dressing and wrap with Kerlix roll gauze to secure.  SOAK XEROFORM WITH NS IF STUCK TO WOUND BED FOR ATRAUMATIC REMOVAL.   POC discussed with bedside nurse. WOC team will not follow. Re-consult if further needs arise.   Thank you,    Ronni Colace MSN, RN-BC, [employer] [phone]

## 2023-08-03 DIAGNOSIS — I63511 Cerebral infarction due to unspecified occlusion or stenosis of right middle cerebral artery: Secondary | ICD-10-CM | POA: Diagnosis not present

## 2023-08-03 LAB — GLUCOSE, CAPILLARY
Glucose-Capillary: 188 mg/dL — ABNORMAL HIGH (ref 70–99)
Glucose-Capillary: 111 mg/dL — ABNORMAL HIGH (ref 70–99)
Glucose-Capillary: 139 mg/dL — ABNORMAL HIGH (ref 70–99)
Glucose-Capillary: 177 mg/dL — ABNORMAL HIGH (ref 70–99)

## 2023-08-03 NOTE — Progress Notes (Signed)
 Physical Therapy Session Note  Patient Details  Name: [name]. MRN: [id number] Date of Birth: 10-01-1950  Today's Date: 08/03/2023 PT Individual Time: 4098-1191 PT Individual Time Calculation (min): 48 min   Short Term Goals: Week 2:  PT Short Term Goal 1 (Week 2): pt will perform bed mobility with mod A with LRAD PT Short Term Goal 2 (Week 2): pt will perform bed to chair transfer with min A with LRAD PT Short Term Goal 3 (Week 2): pt will ambulate 50 feet with LRAD and mod A PT Short Term Goal 4 (Week 2): pt will participate in custom WC eval PT Short Term Goal 5 (Week 2): pt will initiate WC propulsion with hemi technique  Skilled Therapeutic Interventions/Progress Updates: Patient sitting in recliner on entrance to room. Patient alert and agreeable to PT session.   Patient reported no pain during session. Pt willing to pick up PT session due to not having therapy over the weekend. Pt performed transfer from recliner<WC with modA due to room set up, and difficulty of standing from recliner surface. Pt transported to day room in [facility] and participated in transfers to R and L WC<>edge of mat with minA to L and heavy min to the R (swedish knee cage donned on L). Pt cued to weight shift accordingly, and to maintain safe proximity to RW. Pt transported to ortho gym to perform car transfer. PTA encouraged pt to have wife pick up pt at d/c with her car vs pt's due to pt's car having a running board that pt would have to step up to. PTA deemed car transfer to simulator non-productive due to set up of doorway access and flooring, and stated to pt that car transfer will be done next week with pt's wife's car to better assess and practice. Pt transported back to room and transferred to toilet via STEDY with CGA to stand, and minA to control descent to toilet (STEDY used due to bathroom set up).  Patient sitting on toilet to have BM at end of session with nsg notified.      Therapy  Documentation Precautions:  Precautions Precautions: Fall Precaution/Restrictions Comments: falls, L hemi, mild L sided inattention with L lateral lean Restrictions Weight Bearing Restrictions Per Provider Order: No  Therapy/Group: Individual Therapy  Donnell Beauchamp PTA 08/03/2023, 3:20 PM

## 2023-08-03 NOTE — Progress Notes (Signed)
 PROGRESS NOTE   Subjective/Complaints:  Pt reports no issues LBM 2 days ago- was cleaned out- normal for him Denies pain.  Asking about a cushion to sit in on bedside recliner.   ROS:  Pt denies SOB, abd pain, CP, N/V/C/D, and vision changes   Objective:   No results found. Recent Labs    08/01/23 0636  WBC 7.1  HGB 11.3*  HCT 35.4*  PLT 407*      Recent Labs    08/01/23 0636  NA 140  K 3.6  CL 107  CO2 24  GLUCOSE 101*  BUN 16  CREATININE 1.24  CALCIUM  8.8*       Intake/Output Summary (Last 24 hours) at 08/03/2023 0956 Last data filed at 08/03/2023 0740 Gross per 24 hour  Intake 240 ml  Output 450 ml  Net -210 ml        Physical Exam: Vital Signs Blood pressure 118/63, pulse 68, temperature 98.1 F (36.7 C), resp. rate 18, weight 127.7 kg, SpO2 96%.    General: awake, alert, appropriate, sitting up in recliner, wife at bedside; NAD HENT: conjugate gaze; oropharynx moist CV: regular rate and rhythm; no JVD Pulmonary: CTA B/L; no W/R/R- good air movement GI: soft, NT, ND, (+)BS Psychiatric: appropriate- slightly delayed Neurological: Ox3- but slightly delayed responses Skin: No evidence of breakdown, no evidence of rash  Neurologic: Cranial nerves II through XII intact, motor strength is 5/5 in right and 3-/5 left deltoid, bicep, tricep,0/5 FE and  2+ grip, 3-hip flexor, knee extensors, ankle dorsiflexor and plantar flexor. Flexor tone in left fingers/wrist.  Gait with mod assist, hyperextension at the left knee does have adequate clearance of the left foot.  No pain with ambulation Cerebellar exam remains limited by weakness on the left side  Prior neuro assessment is c/w today's exam 08/03/2023.  Musculoskeletal: Full passive  range of motion in all 4 extremities. No joint swelling   Assessment/Plan: 1. Functional deficits which require 3+ hours per day of interdisciplinary therapy in  a comprehensive inpatient rehab setting. Physiatrist is providing close team supervision and 24 hour management of active medical problems listed below. Physiatrist and rehab team continue to assess barriers to discharge/monitor patient progress toward functional and medical goals  Care Tool:  Bathing    Body parts bathed by patient: Left arm, Chest, Abdomen, Right upper leg, Left upper leg, Face, Right arm, Front perineal area, Right lower leg, Left lower leg (used long sponge to reach R arm)   Body parts bathed by helper: Buttocks     Bathing assist Assist Level: Minimal Assistance - Patient > 75%     Upper Body Dressing/Undressing Upper body dressing   What is the patient wearing?: Pull over shirt    Upper body assist Assist Level: Minimal Assistance - Patient > 75%    Lower Body Dressing/Undressing Lower body dressing      What is the patient wearing?: Pants     Lower body assist Assist for lower body dressing: Moderate Assistance - Patient 50 - 74% (used reacher to don over feet)     Editor, commissioning assist Assist for toileting: Moderate Assistance -  Patient 50 - 74%     Transfers Chair/bed transfer  Transfers assist     Chair/bed transfer assist level: 2 Helpers (MAX A +2 squat pivot)     Locomotion Ambulation   Ambulation assist   Ambulation activity did not occur: Safety/medical concerns (ambulated 25' with RHR in hallway mod assist and +2 WC follow)  Assist level: Maximal Assistance - Patient 25 - 49% Assistive device:  (hand rail as well as HR and under L shoulder.) Max distance: 30   Walk 10 feet activity   Assist  Walk 10 feet activity did not occur: Safety/medical concerns  Assist level: Maximal Assistance - Patient 25 - 49% Assistive device:  (hand rail as well as HR and under L shoulder.)   Walk 50 feet activity   Assist Walk 50 feet with 2 turns activity did not occur: Safety/medical concerns         Walk 150  feet activity   Assist Walk 150 feet activity did not occur: Safety/medical concerns         Walk 10 feet on uneven surface  activity   Assist Walk 10 feet on uneven surfaces activity did not occur: Safety/medical concerns         Wheelchair     Assist Is the patient using a wheelchair?: Yes Type of Wheelchair: Manual    Wheelchair assist level: Dependent - Patient 0%      Wheelchair 50 feet with 2 turns activity    Assist        Assist Level: Dependent - Patient 0%   Wheelchair 150 feet activity     Assist      Assist Level: Dependent - Patient 0%   Blood pressure 118/63, pulse 68, temperature 98.1 F (36.7 C), resp. rate 18, weight 127.7 kg, SpO2 96%.  Medical Problem List and Plan: 1. Functional deficits secondary to right MCA infarct             -patient may shower             -ELOS/Goals: 5/20 supervision goals with PT and SLP and sup/min assist with OT         Con't CIR- no therapy today  Spoke with PT- will get him a cushion ot sit on in recliner May need orthotics consult for hyperextension at the knee if this does not improve 2.  Antithrombotics: -DVT/anticoagulation:  Pharmaceutical: Lovenox              -antiplatelet therapy: ASA 3. Pain Management: Tylenol  prn.  4. Mood/Behavior/Sleep: LCSW to follow for evaluation and support.              -antipsychotic agents: N/A 5. Neuropsych/cognition: This patient is capable of making decisions on his own behalf. 6. Skin/Wound Care: LCSW to follow for evaluation and support             --Gerhardt's butt cream to MASD on buttocks             --Diflucan for candida cruris.              --pressure relief measures to left elbow/left heel.  7. Fluids/Electrolytes/Nutrition:  encourage appropriate PO    Latest Ref Rng & Units 08/01/2023    6:36 AM 07/29/2023    5:36 AM 07/25/2023    5:53 AM  BMP  Glucose 70 - 99 mg/dL 981  191  98   BUN 8 - 23 mg/dL 16  16  20    Creatinine 0.61 -  1.24 mg/dL  7.25  3.66  4.40   Sodium 135 - 145 mmol/L 140  138  143   Potassium 3.5 - 5.1 mmol/L 3.6  4.0  3.7   Chloride 98 - 111 mmol/L 107  106  106   CO2 22 - 32 mmol/L 24  24  23    Calcium  8.9 - 10.3 mg/dL 8.8  8.5  8.8      3/47 Cr sl elevated--might be near baseline 8. T2DM: Hgb A1C- 8.2 (PTA) Was taking Tresiba 54 units in am ( now on Semglee  22U ) and  Metformin  1000 mg--bid (restart Metformin  at 500mg  BID)   CBG (last 3)  Recent Labs    08/02/23 1622 08/02/23 2202 08/03/23 0710  GLUCAP 125* 129* 188*   5/10- CBGs look great- con't regimen Improved 5/8-9  5/4 increase novolog  to 4u tid and increase to 5U 5/6    -adjust semglee  to 27 u starting 5/5- increase to 30U 5/6 AM CBGs are in range but daytime elevated , increase novalog to 6U TID 9.   HTN: Monitor BP TID--losartan  50 mg daily--was held for a few days to prevent hypoperfusion             -- parameters to hold prn sitting SBP< 120.   5/10- BP controlled- con't regimen Vitals:   08/02/23 2051 08/03/23 0639  BP: 134/72 118/63  Pulse: 69 68  Resp: 18 18  Temp: 98.1 F (36.7 C) 98.1 F (36.7 C)  SpO2: 99% 96%    10.  Myasthenia Gravis Mycophenolate  1500/1250mg  --Dr. Franklin Ito at [facility].              Prednisone  5 mg PTA--->has been weaned down to 2 mg daily, monitor for Ptosis and generalized proximal weakness Asymptomatic for MG 5/6 11. Hypothyroid: Stable on levothyroxine  for supplement.  12.  Dyslipidemia: Was on pravastatin 80 mg daily 13. H/o Kidney stones: On Sodium bicarb BID to prevent stones.  14. Constipation: On miralax  daily             --sorbitol  added.   -last bm 5/1--> give sorbitol  today 5/4  5/10- LBM 5/8-  15. H/o GERD: Has been off PPI.  16.  Mild anemia check stool guaic on ASA and Lovenox     Latest Ref Rng & Units 08/01/2023    6:36 AM 07/29/2023    5:36 AM 07/25/2023    5:53 AM  CBC  WBC 4.0 - 10.5 K/uL 7.1  7.2  7.4   Hemoglobin 13.0 - 17.0 g/dL 42.5  95.6  38.7   Hematocrit 39.0 - 52.0 % 35.4  33.7   34.8   Platelets 150 - 400 K/uL 407  346  339    Slight drop vs 4/28 but last 2 values stable    I spent a total of  36  minutes on total care today- >50% coordination of care- due to  D/w nursing and Therapy about getting an additional cushion for recliner.   LOS: 15 days A FACE TO FACE EVALUATION WAS PERFORMED  Shemia Bevel 08/03/2023, 9:56 AM

## 2023-08-04 DIAGNOSIS — I63511 Cerebral infarction due to unspecified occlusion or stenosis of right middle cerebral artery: Secondary | ICD-10-CM | POA: Diagnosis not present

## 2023-08-04 LAB — GLUCOSE, CAPILLARY
Glucose-Capillary: 121 mg/dL — ABNORMAL HIGH (ref 70–99)
Glucose-Capillary: 167 mg/dL — ABNORMAL HIGH (ref 70–99)
Glucose-Capillary: 180 mg/dL — ABNORMAL HIGH (ref 70–99)
Glucose-Capillary: 81 mg/dL (ref 70–99)

## 2023-08-04 MED ORDER — SORBITOL 70 % SOLN
30.0000 mL | Freq: Once | Status: DC
  Filled 2023-08-04: qty 30

## 2023-08-04 NOTE — Plan of Care (Signed)
  Problem: Consults Goal: RH STROKE PATIENT EDUCATION Description: See Patient Education module for education specifics  Outcome: Progressing   Problem: RH BOWEL ELIMINATION Goal: RH STG MANAGE BOWEL WITH ASSISTANCE Description: STG Manage Bowel with mod I  Assistance. Outcome: Progressing Goal: RH STG MANAGE BOWEL W/MEDICATION W/ASSISTANCE Description: STG Manage Bowel with Medication with mod I Assistance. Outcome: Progressing   Problem: RH SAFETY Goal: RH STG ADHERE TO SAFETY PRECAUTIONS W/ASSISTANCE/DEVICE Description: STG Adhere to Safety Precautions With cues Assistance/Device. Outcome: Progressing   Problem: RH PAIN MANAGEMENT Goal: RH STG PAIN MANAGED AT OR BELOW PT'S PAIN GOAL Description: < 4 with prns Outcome: Progressing   Problem: RH KNOWLEDGE DEFICIT Goal: RH STG INCREASE KNOWLEDGE OF DIABETES Description: Patient and spouse will be able to manage DM using educational resources for medications and dietary modification independently Outcome: Progressing Goal: RH STG INCREASE KNOWLEDGE OF HYPERTENSION Description: Patient and spouse will be able to manage HTN using educational resources for medications and dietary modification independently Outcome: Progressing Goal: RH STG INCREASE KNOWLEGDE OF HYPERLIPIDEMIA Description: Patient and spouse will be able to manage HLD using educational resources for medications and dietary modification independently Outcome: Progressing Goal: RH STG INCREASE KNOWLEDGE OF STROKE PROPHYLAXIS Description: Patient and spouse will be able to manage secondary risks using educational resources for medications and dietary modification independently Outcome: Progressing   Problem: Education: Goal: Ability to describe self-care measures that may prevent or decrease complications (Diabetes Survival Skills Education) will improve Outcome: Progressing Goal: Individualized Educational Video(s) Outcome: Progressing   Problem: Coping: Goal:  Ability to adjust to condition or change in health will improve Outcome: Progressing   Problem: Fluid Volume: Goal: Ability to maintain a balanced intake and output will improve Outcome: Progressing   Problem: Health Behavior/Discharge Planning: Goal: Ability to identify and utilize available resources and services will improve Outcome: Progressing Goal: Ability to manage health-related needs will improve Outcome: Progressing   Problem: Metabolic: Goal: Ability to maintain appropriate glucose levels will improve Outcome: Progressing   Problem: Nutritional: Goal: Maintenance of adequate nutrition will improve Outcome: Progressing Goal: Progress toward achieving an optimal weight will improve Outcome: Progressing   Problem: RH Vision Goal: RH LTG Vision (Specify) Outcome: Progressing

## 2023-08-04 NOTE — Progress Notes (Signed)
 PROGRESS NOTE   Subjective/Complaints:  Pt asleep- dozing in W/C- asking to go back to bed.   ROS:   Pt denies SOB, abd pain, CP, N/V/C/D, and vision changes    Objective:   No results found. No results for input(s): "WBC", "HGB", "HCT", "PLT" in the last 72 hours.     No results for input(s): "NA", "K", "CL", "CO2", "GLUCOSE", "BUN", "CREATININE", "CALCIUM " in the last 72 hours.      Intake/Output Summary (Last 24 hours) at 08/04/2023 1024 Last data filed at 08/04/2023 0415 Gross per 24 hour  Intake 236 ml  Output 200 ml  Net 36 ml        Physical Exam: Vital Signs Blood pressure (!) 115/98, pulse (!) 59, temperature 98.2 F (36.8 C), temperature source Oral, resp. rate 18, weight 127.7 kg, SpO2 95%.     General: woke him from sleep- u in w/c;  NAD HENT: conjugate gaze; oropharynx moist CV: regular  rhythm, borderline bradycardic rate; no JVD Pulmonary: CTA B/L; no W/R/R- good air movement GI: soft, NT, ND, (+)BS Psychiatric: appropriate but sleepy Neurological: sleepy- just woke him up Skin: No evidence of breakdown, no evidence of rash  Neurologic: Cranial nerves II through XII intact, motor strength is 5/5 in right and 3-/5 left deltoid, bicep, tricep,0/5 FE and  2+ grip, 3-hip flexor, knee extensors, ankle dorsiflexor and plantar flexor. Flexor tone in left fingers/wrist.  Gait with mod assist, hyperextension at the left knee does have adequate clearance of the left foot.  No pain with ambulation Cerebellar exam remains limited by weakness on the left side  Prior neuro assessment is c/w today's exam 08/04/2023.  Musculoskeletal: Full passive  range of motion in all 4 extremities. No joint swelling   Assessment/Plan: 1. Functional deficits which require 3+ hours per day of interdisciplinary therapy in a comprehensive inpatient rehab setting. Physiatrist is providing close team supervision and 24  hour management of active medical problems listed below. Physiatrist and rehab team continue to assess barriers to discharge/monitor patient progress toward functional and medical goals  Care Tool:  Bathing    Body parts bathed by patient: Left arm, Chest, Abdomen, Right upper leg, Left upper leg, Face, Right arm, Front perineal area, Right lower leg, Left lower leg (used long sponge to reach R arm)   Body parts bathed by helper: Buttocks     Bathing assist Assist Level: Minimal Assistance - Patient > 75%     Upper Body Dressing/Undressing Upper body dressing   What is the patient wearing?: Pull over shirt    Upper body assist Assist Level: Minimal Assistance - Patient > 75%    Lower Body Dressing/Undressing Lower body dressing      What is the patient wearing?: Pants     Lower body assist Assist for lower body dressing: Moderate Assistance - Patient 50 - 74% (used reacher to don over feet)     Editor, commissioning assist Assist for toileting: Moderate Assistance - Patient 50 - 74%     Transfers Chair/bed transfer  Transfers assist     Chair/bed transfer assist level: 2 Helpers (MAX A +2 squat pivot)  Locomotion Ambulation   Ambulation assist   Ambulation activity did not occur: Safety/medical concerns (ambulated 25' with RHR in hallway mod assist and +2 WC follow)  Assist level: Maximal Assistance - Patient 25 - 49% Assistive device:  (hand rail as well as HR and under L shoulder.) Max distance: 30   Walk 10 feet activity   Assist  Walk 10 feet activity did not occur: Safety/medical concerns  Assist level: Maximal Assistance - Patient 25 - 49% Assistive device:  (hand rail as well as HR and under L shoulder.)   Walk 50 feet activity   Assist Walk 50 feet with 2 turns activity did not occur: Safety/medical concerns         Walk 150 feet activity   Assist Walk 150 feet activity did not occur: Safety/medical concerns          Walk 10 feet on uneven surface  activity   Assist Walk 10 feet on uneven surfaces activity did not occur: Safety/medical concerns         Wheelchair     Assist Is the patient using a wheelchair?: Yes Type of Wheelchair: Manual    Wheelchair assist level: Dependent - Patient 0%      Wheelchair 50 feet with 2 turns activity    Assist        Assist Level: Dependent - Patient 0%   Wheelchair 150 feet activity     Assist      Assist Level: Dependent - Patient 0%   Blood pressure (!) 115/98, pulse (!) 59, temperature 98.2 F (36.8 C), temperature source Oral, resp. rate 18, weight 127.7 kg, SpO2 95%.  Medical Problem List and Plan: 1. 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  Antithrombotics: -DVT/anticoagulation:  Pharmaceutical: Lovenox              -antiplatelet therapy: ASA 3. Pain Management: Tylenol  prn.  4. Mood/Behavior/Sleep: LCSW to follow for evaluation and support.              -antipsychotic agents: N/A 5. Neuropsych/cognition: This patient is capable of making decisions on his own behalf. 6. Skin/Wound Care: LCSW to follow for evaluation and support             --[name]'s butt cream to MASD on buttocks             --Diflucan for candida cruris.              --pressure relief measures to left elbow/left heel.  7. Fluids/Electrolytes/Nutrition:  encourage appropriate PO    Latest Ref Rng & Units 08/01/2023    6:36 AM 07/29/2023    5:36 AM 07/25/2023    5:53 AM  BMP  Glucose 70 - 99 mg/dL 782  956  98   BUN 8 - 23 mg/dL 16  16  20    Creatinine 0.61 - 1.24 mg/dL 2.13  0.86  5.78   Sodium 135 - 145 mmol/L 140  138  143   Potassium 3.5 - 5.1  mmol/L 3.6  4.0  3.7   Chloride 98 - 111 mmol/L 107  106  106   CO2 22 - 32 mmol/L 24  24  23    Calcium  8.9 - 10.3 mg/dL 8.8  8.5  8.8      8/65 Cr sl elevated--might be near baseline 8. T2DM: Hgb A1C- 8.2 (PTA) Was taking Tresiba 54 units in am ( now on Semglee  22U ) and  Metformin  1000 mg--bid (restart Metformin  at 500mg  BID)   CBG (last 3)  Recent Labs    08/03/23 1627 08/03/23 2116 08/04/23 0544  GLUCAP 139* 177* 121*   5/10-5/11 CBGs look great- con't regimen Improved 5/8-9  5/4 increase novolog  to 4u tid and increase to 5U 5/6    -adjust semglee  to 27 u starting 5/5- increase to 30U 5/6 AM CBGs are in range but daytime elevated , increase novalog to 6U TID 9.   HTN: Monitor BP TID--losartan  50 mg daily--was held for a few days to prevent hypoperfusion             -- parameters to hold prn sitting SBP< 120.   5/10- 5/11- BP controlled- con't regimen Vitals:   08/03/23 2022 08/04/23 0449  BP: 113/69 (!) 115/98  Pulse: 71 (!) 59  Resp: 18 18  Temp: 98.5 F (36.9 C) 98.2 F (36.8 C)  SpO2: 97% 95%    10.  Myasthenia Gravis Mycophenolate  1500/1250mg  --Dr. Franklin Ito at [facility].              Prednisone  5 mg PTA--->has been weaned down to 2 mg daily, monitor for Ptosis and generalized proximal weakness Asymptomatic for MG 5/6 11. Hypothyroid: Stable on levothyroxine  for supplement.  12.  Dyslipidemia: Was on pravastatin 80 mg daily 13. H/o Kidney stones: On Sodium bicarb BID to prevent stones.  14. Constipation: On miralax  daily             --sorbitol  added.   -last bm 5/1--> give sorbitol  today 5/4  5/10- LBM 5/8-   5/11- LBM 3 days ago- will order Sorbitol  this afternoon if doesn't go 15. H/o GERD: Has been off PPI.  16.  Mild anemia check stool guaic on ASA and Lovenox     Latest Ref Rng & Units 08/01/2023    6:36 AM 07/29/2023    5:36 AM 07/25/2023    5:53 AM  CBC  WBC 4.0 - 10.5 K/uL 7.1  7.2  7.4   Hemoglobin 13.0 - 17.0 g/dL 78.4  69.6  29.5   Hematocrit 39.0 - 52.0 %  35.4  33.7  34.8   Platelets 150 - 400 K/uL 407  346  339    Slight drop vs 4/28 but last 2 values stable     LOS: 16 days A FACE TO FACE EVALUATION WAS PERFORMED  [name] 08/04/2023, 10:24 AM

## 2023-08-05 DIAGNOSIS — G7000 Myasthenia gravis without (acute) exacerbation: Secondary | ICD-10-CM | POA: Diagnosis not present

## 2023-08-05 DIAGNOSIS — I639 Cerebral infarction, unspecified: Secondary | ICD-10-CM | POA: Diagnosis not present

## 2023-08-05 DIAGNOSIS — I63511 Cerebral infarction due to unspecified occlusion or stenosis of right middle cerebral artery: Secondary | ICD-10-CM | POA: Diagnosis not present

## 2023-08-05 DIAGNOSIS — E1169 Type 2 diabetes mellitus with other specified complication: Secondary | ICD-10-CM | POA: Diagnosis not present

## 2023-08-05 LAB — BASIC METABOLIC PANEL WITH GFR
Anion gap: 10 (ref 5–15)
BUN: 17 mg/dL (ref 8–23)
CO2: 23 mmol/L (ref 22–32)
Calcium: 8.4 mg/dL — ABNORMAL LOW (ref 8.9–10.3)
Chloride: 106 mmol/L (ref 98–111)
Creatinine, Ser: 1.19 mg/dL (ref 0.61–1.24)
GFR, Estimated: >60 mL/min (ref >60)
Glucose, Bld: 115 mg/dL — ABNORMAL HIGH (ref 70–99)
Potassium: 3.8 mmol/L (ref 3.5–5.1)
Sodium: 139 mmol/L (ref 135–145)

## 2023-08-05 LAB — GLUCOSE, CAPILLARY
Glucose-Capillary: 123 mg/dL — ABNORMAL HIGH (ref 70–99)
Glucose-Capillary: 120 mg/dL — ABNORMAL HIGH (ref 70–99)
Glucose-Capillary: 147 mg/dL — ABNORMAL HIGH (ref 70–99)
Glucose-Capillary: 116 mg/dL — ABNORMAL HIGH (ref 70–99)

## 2023-08-05 LAB — CBC
HCT: 35.2 % — ABNORMAL LOW (ref 39.0–52.0)
Hemoglobin: 11.3 g/dL — ABNORMAL LOW (ref 13.0–17.0)
MCH: 29.7 pg (ref 26.0–34.0)
MCHC: 32.1 g/dL (ref 30.0–36.0)
MCV: 92.6 fL (ref 80.0–100.0)
Platelets: 365 10*3/uL (ref 150–400)
RBC: 3.8 MIL/uL — ABNORMAL LOW (ref 4.22–5.81)
RDW: 12.8 % (ref 11.5–15.5)
WBC: 6.1 10*3/uL (ref 4.0–10.5)
nRBC: 0 % (ref 0.0–0.2)

## 2023-08-05 NOTE — Progress Notes (Signed)
 Speech Language Pathology Daily Session Note  Patient Details  Name: [name]. MRN: [id number] Date of Birth: May 12, 1950  Today's Date: 08/05/2023 SLP Individual Time: 1105-1201 SLP Individual Time Calculation (min): 56 min  Short Term Goals: Week 2: SLP Short Term Goal 1 (Week 2): Patient will complete mildly complex problem solving tasks with supervision multimodal A SLP Short Term Goal 2 (Week 2): Patient will recall and utilize memory strategies given supervision multimodal A SLP Short Term Goal 3 (Week 2): Patient will attend to L visual field during functional tasks given supervision multimodal A  Skilled Therapeutic Interventions:  Patient was seen in am to address cognitive re- training. Pt was alert and seated upright in WC upon SLP arrival. He denied pain. SLP initiated session through challenging pt in multiplication time tables up to the multiples of 20 which pt completed with sup A. In other minutes of session SLP intrroduced a deductive puzzle. Pt initially warranting mod A with pt able to complete task with min A during an additional puzzle. Pt was left upright in WC with call buttton within reach at conclusion of session. SLP to continue POC.   Pain Pain Assessment Pain Scale: 0-10 Pain Score: 0-No pain  Therapy/Group: Individual Therapy  Adela Holter 08/05/2023, 11:53 AM

## 2023-08-05 NOTE — Progress Notes (Signed)
 Occupational Therapy Session Note  Patient Details  Name: [name]. MRN: [id number] Date of Birth: [birth year]  Today's Date: 08/05/2023 OT Individual Time: 4098-1191 OT Individual Time Calculation (min): 49 min    Short Term Goals: Week 3:  OT Short Term Goal 1 (Week 3): Pt will don a shirt with supervision. OT Short Term Goal 2 (Week 3): Pt will be able to pull pants over hips with 20% help or less. OT Short Term Goal 3 (Week 3): Pt will complete stand pivot transfers to toilet with RW with min A.  Skilled Therapeutic Interventions/Progress Updates:  Pt greeted seated in w/c, pt agreeable to OT intervention.      Transfers/bed mobility/functional mobility:  Pt completed sit>stand from w/c and EOM with MIN A with assist needed to place LUE in saddle splint. Pt completed stand pivot to w/c<>EOM with RW and MINA.    Education: discussed DME needs for home and ADL plan with pt reporting that he plans to get a stedy for home to assist with toileting and LB ADLS even though pt is progressing with stand pivot transfers and can complete stand pivot into shower and toilet at home.   Plan to practice stand pivots to toilet and shower in later sessions.    NMR:  Applied Zynex NMES to LUE digit and wrist extensors at 10:10 ratio at level 28 for 10 mins with a focus on having pt grasp while channel was off and then use channel to facilitate wrist/digit extension. No pain or adverse reactions noted.         Ended session with pt seated in w/c with all needs within reach.   Therapy Documentation Precautions:  Precautions Precautions: Fall Precaution/Restrictions Comments: falls, L hemi, mild L sided inattention with L lateral lean Restrictions Weight Bearing Restrictions Per Provider Order: No    Pain: No pain    Therapy/Group: Individual Therapy  Mollie Anger [facility] 08/05/2023, 12:18 PM

## 2023-08-05 NOTE — Plan of Care (Signed)
  Problem: Consults Goal: RH STROKE PATIENT EDUCATION Description: See Patient Education module for education specifics  Outcome: Progressing   Problem: RH BOWEL ELIMINATION Goal: RH STG MANAGE BOWEL WITH ASSISTANCE Description: STG Manage Bowel with mod I  Assistance. Outcome: Progressing Goal: RH STG MANAGE BOWEL W/MEDICATION W/ASSISTANCE Description: STG Manage Bowel with Medication with mod I Assistance. Outcome: Progressing   Problem: RH SAFETY Goal: RH STG ADHERE TO SAFETY PRECAUTIONS W/ASSISTANCE/DEVICE Description: STG Adhere to Safety Precautions With cues Assistance/Device. Outcome: Progressing   Problem: RH PAIN MANAGEMENT Goal: RH STG PAIN MANAGED AT OR BELOW PT'S PAIN GOAL Description: < 4 with prns Outcome: Progressing   Problem: RH KNOWLEDGE DEFICIT Goal: RH STG INCREASE KNOWLEDGE OF DIABETES Description: Patient and spouse will be able to manage DM using educational resources for medications and dietary modification independently Outcome: Progressing Goal: RH STG INCREASE KNOWLEDGE OF HYPERTENSION Description: Patient and spouse will be able to manage HTN using educational resources for medications and dietary modification independently Outcome: Progressing Goal: RH STG INCREASE KNOWLEGDE OF HYPERLIPIDEMIA Description: Patient and spouse will be able to manage HLD using educational resources for medications and dietary modification independently Outcome: Progressing Goal: RH STG INCREASE KNOWLEDGE OF STROKE PROPHYLAXIS Description: Patient and spouse will be able to manage secondary risks using educational resources for medications and dietary modification independently Outcome: Progressing   Problem: Education: Goal: Ability to describe self-care measures that may prevent or decrease complications (Diabetes Survival Skills Education) will improve Outcome: Progressing Goal: Individualized Educational Video(s) Outcome: Progressing   Problem: Coping: Goal:  Ability to adjust to condition or change in health will improve Outcome: Progressing   Problem: Fluid Volume: Goal: Ability to maintain a balanced intake and output will improve Outcome: Progressing   Problem: Health Behavior/Discharge Planning: Goal: Ability to identify and utilize available resources and services will improve Outcome: Progressing Goal: Ability to manage health-related needs will improve Outcome: Progressing   Problem: Metabolic: Goal: Ability to maintain appropriate glucose levels will improve Outcome: Progressing   Problem: Nutritional: Goal: Maintenance of adequate nutrition will improve Outcome: Progressing Goal: Progress toward achieving an optimal weight will improve Outcome: Progressing   Problem: RH Vision Goal: RH LTG Vision (Specify) Outcome: Progressing

## 2023-08-05 NOTE — Progress Notes (Signed)
 Speech Language Pathology Daily Session Note  Patient Details  Name: Creeden Demers. MRN: [id number] Date of Birth: January 13, 1951  Today's Date: 08/05/2023 SLP Individual Time: 4098-1191 SLP Individual Time Calculation (min): 60 min  Short Term Goals: Week 2: SLP Short Term Goal 1 (Week 2): Patient will complete mildly complex problem solving tasks with supervision multimodal A SLP Short Term Goal 2 (Week 2): Patient will recall and utilize memory strategies given supervision multimodal A SLP Short Term Goal 3 (Week 2): Patient will attend to L visual field during functional tasks given supervision multimodal A  Skilled Therapeutic Interventions: Skilled therapy session focused on cognitive goals. SLP facilitated session by providing minA during mathematical word problems. Patient with increased accuracy during addition/subtraction/multiplication compared to prior, however required minA to slow down and check work. Patient left in chair with alarm set and call bell in reach. Continue POC.  Pain None reported   Therapy/Group: Individual Therapy  Leydi Winstead M.A., CCC-SLP 08/05/2023, 7:41 AM

## 2023-08-05 NOTE — Progress Notes (Signed)
 Speech Language Pathology Weekly Progress and Session Note  Patient Details  Name: [name]. MRN: [id number] Date of Birth: July 22, 1950  Beginning of progress report period: Jul 29, 2023 End of progress report period: Aug 05, 2023  Short Term Goals: Week 2: SLP Short Term Goal 1 (Week 2): Patient will complete mildly complex problem solving tasks with supervision multimodal A SLP Short Term Goal 1 - Progress (Week 2): Progressing toward goal SLP Short Term Goal 2 (Week 2): Patient will recall and utilize memory strategies given supervision multimodal A SLP Short Term Goal 2 - Progress (Week 2): Met SLP Short Term Goal 3 (Week 2): Patient will attend to L visual field during functional tasks given supervision multimodal A SLP Short Term Goal 3 - Progress (Week 2): Met    New Short Term Goals: Week 3: SLP Short Term Goal 1 (Week 3): STG = LTG due to ELOS  Weekly Progress Updates: Pt has made good gains and has met 2 of 3 STGs this reporting period due to improved cognitive skills. Currently, patient continues to require supervision A for memory and attention to the L. Patient did not meet problem solving goal this reporting period due to continued need for at least minA to complete mildly complex functional problems. Pt/family eduction ongoing. Pt would benefit from continued ST intervention to maximize cognition in order to maximize functional independence at d/c.    Intensity: Minumum of 1-2 x/day, 30 to 90 minutes Frequency: 1 to 3 out of 7 days Duration/Length of Stay: 5/20 Treatment/Interventions: Cueing hierarchy;Cognitive remediation/compensation;Internal/external aids;Therapeutic Activities;Functional tasks;Patient/family education   Pain None reported to SLP  Therapy/Group: Individual Therapy  Tavonte Seybold M.A., CCC-SLP 08/05/2023, 2:17 PM

## 2023-08-05 NOTE — Progress Notes (Signed)
 Physical Therapy Session Note  Patient Details  Name: [name]. MRN: [id number] Date of Birth: 04/20/1950  Today's Date: 08/05/2023 PT Individual Time: 1535-1620 PT Individual Time Calculation (min): 45 min   Short Term Goals: Week 1:  PT Short Term Goal 1 (Week 1): Pt will complete bed mobility mod assist PT Short Term Goal 1 - Progress (Week 1): Progressing toward goal PT Short Term Goal 2 (Week 1): Pt will complete sit<>stand transfers with LRAD with min assist PT Short Term Goal 2 - Progress (Week 1): Progressing toward goal PT Short Term Goal 3 (Week 1): Pt will complete bed<>chair transfers with LRAD with mod assist PT Short Term Goal 3 - Progress (Week 1): Progressing toward goal PT Short Term Goal 4 (Week 1): Pt will ambulate with LRAD 11' with mod assist PT Short Term Goal 4 - Progress (Week 1): Progressing toward goal Week 2:  PT Short Term Goal 1 (Week 2): pt will perform bed mobility with mod A with LRAD PT Short Term Goal 2 (Week 2): pt will perform bed to chair transfer with min A with LRAD PT Short Term Goal 3 (Week 2): pt will ambulate 50 feet with LRAD and mod A PT Short Term Goal 4 (Week 2): pt will participate in custom WC eval PT Short Term Goal 5 (Week 2): pt will initiate WC propulsion with hemi technique Week 3:     Skilled Therapeutic Interventions/Progress Updates:      Therapy Documentation Precautions:  Precautions Precautions: Fall Precaution/Restrictions Comments: falls, L hemi, mild L sided inattention with L lateral lean Restrictions Weight Bearing Restrictions Per Provider Order: No General:   Vital Signs: Therapy Vitals Temp: 97.9 F (36.6 C) Pulse Rate: 72 Resp: 18 BP: (!) 108/58 Patient Position (if appropriate): Sitting Oxygen Therapy SpO2: 100 % O2 Device: Room Air Pain:   Mobility:   Locomotion :    Trunk/Postural Assessment :    Balance:   Exercises:   Other Treatments:      Therapy/Group: Individual  Therapy  Donne Gage PT, DPT, CSRS 08/05/2023, 6:10 PM

## 2023-08-05 NOTE — Progress Notes (Signed)
 Orthopedic Tech Progress Note Patient Details:  [name] 02-21-1951 [id number]  Called in order to [employer] for a SWEDISH KNEE CAGE   Patient ID: Douglas Taveras., male   DOB: 02/28/1951, 73 y.o.   MRN: [id number]  [name] 08/05/2023, 9:28 AM

## 2023-08-05 NOTE — Progress Notes (Signed)
 PROGRESS NOTE   Subjective/Complaints:  No issues overnite  DIscussed need for Swedish knee cage Pt is ordering steadi lift  Good appetite, LBM 5/10  ROS:   Pt denies SOB, abd pain, CP, N/V/C/D,   Objective:   No results found. Recent Labs    08/05/23 0533  WBC 6.1  HGB 11.3*  HCT 35.2*  PLT 365       Recent Labs    08/05/23 0533  NA 139  K 3.8  CL 106  CO2 23  GLUCOSE 115*  BUN 17  CREATININE 1.19  CALCIUM  8.4*        Intake/Output Summary (Last 24 hours) at 08/05/2023 0908 Last data filed at 08/05/2023 0904 Gross per 24 hour  Intake 840 ml  Output 300 ml  Net 540 ml        Physical Exam: Vital Signs Blood pressure 123/66, pulse 67, temperature 98.3 F (36.8 C), temperature source Oral, resp. rate 18, weight 127.7 kg, SpO2 96%.  General: No acute distress Mood and affect are appropriate Heart: Regular rate and rhythm no rubs murmurs or extra sounds Lungs: Clear to auscultation, breathing unlabored, no rales or wheezes Abdomen: Positive bowel sounds, soft nontender to palpation, nondistended Extremities: No clubbing, cyanosis, or edema  Neurologic: Cranial nerves II through XII intact, motor strength is 5/5 in right and 3-/5 left deltoid, bicep, tricep,0/5 FE and  2+ grip, 3-hip flexor, knee extensors, ankle dorsiflexor and plantar flexor. Flexor tone in left fingers/wrist.  Gait with mod assist, hyperextension at the left knee does have adequate clearance of the left foot.  No pain with ambulation Cerebellar exam remains limited by weakness on the left side  Prior neuro assessment is c/w today's exam 08/05/2023.  Musculoskeletal: Full passive  range of motion in all 4 extremities. No joint swelling   Assessment/Plan: 1. Functional deficits which require 3+ hours per day of interdisciplinary therapy in a comprehensive inpatient rehab setting. Physiatrist is providing close team  supervision and 24 hour management of active medical problems listed below. Physiatrist and rehab team continue to assess barriers to discharge/monitor patient progress toward functional and medical goals  Care Tool:  Bathing    Body parts bathed by patient: Left arm, Chest, Abdomen, Right upper leg, Left upper leg, Face, Right arm, Front perineal area, Right lower leg, Left lower leg (used long sponge to reach R arm)   Body parts bathed by helper: Buttocks     Bathing assist Assist Level: Minimal Assistance - Patient > 75%     Upper Body Dressing/Undressing Upper body dressing   What is the patient wearing?: Pull over shirt    Upper body assist Assist Level: Minimal Assistance - Patient > 75%    Lower Body Dressing/Undressing Lower body dressing      What is the patient wearing?: Pants     Lower body assist Assist for lower body dressing: Moderate Assistance - Patient 50 - 74% (used reacher to don over feet)     Editor, commissioning assist Assist for toileting: Moderate Assistance - Patient 50 - 74%     Transfers Chair/bed transfer  Transfers assist  Chair/bed transfer assist level: 2 Helpers (MAX A +2 squat pivot)     Locomotion Ambulation   Ambulation assist   Ambulation activity did not occur: Safety/medical concerns (ambulated 25' with RHR in hallway mod assist and +2 WC follow)  Assist level: Maximal Assistance - Patient 25 - 49% Assistive device:  (hand rail as well as HR and under L shoulder.) Max distance: 30   Walk 10 feet activity   Assist  Walk 10 feet activity did not occur: Safety/medical concerns  Assist level: Maximal Assistance - Patient 25 - 49% Assistive device:  (hand rail as well as HR and under L shoulder.)   Walk 50 feet activity   Assist Walk 50 feet with 2 turns activity did not occur: Safety/medical concerns         Walk 150 feet activity   Assist Walk 150 feet activity did not occur:  Safety/medical concerns         Walk 10 feet on uneven surface  activity   Assist Walk 10 feet on uneven surfaces activity did not occur: Safety/medical concerns         Wheelchair     Assist Is the patient using a wheelchair?: Yes Type of Wheelchair: Manual    Wheelchair assist level: Dependent - Patient 0%      Wheelchair 50 feet with 2 turns activity    Assist        Assist Level: Dependent - Patient 0%   Wheelchair 150 feet activity     Assist      Assist Level: Dependent - Patient 0%   Blood pressure 123/66, pulse 67, temperature 98.3 F (36.8 C), temperature source Oral, resp. rate 18, weight 127.7 kg, SpO2 96%.  Medical Problem List and Plan: 1. Functional deficits secondary to right MCA infarct             -patient may shower             -ELOS/Goals: 5/20 supervision goals with PT and SLP and sup/min assist with OT         Con't CIR   orthotics consult for hyperextension at the knee 2.  Antithrombotics: -DVT/anticoagulation:  Pharmaceutical: Lovenox              -antiplatelet therapy: ASA 3. Pain Management: Tylenol  prn.  4. Mood/Behavior/Sleep: LCSW to follow for evaluation and support.              -antipsychotic agents: N/A 5. Neuropsych/cognition: This patient is capable of making decisions on his own behalf. 6. Skin/Wound Care: LCSW to follow for evaluation and support             --Gerhardt's butt cream to MASD on buttocks             --Diflucan for candida cruris.              --pressure relief measures to left elbow/left heel.  7. Fluids/Electrolytes/Nutrition:  encourage appropriate PO    Latest Ref Rng & Units 08/05/2023    5:33 AM 08/01/2023    6:36 AM 07/29/2023    5:36 AM  BMP  Glucose 70 - 99 mg/dL 161  096  045   BUN 8 - 23 mg/dL 17  16  16    Creatinine 0.61 - 1.24 mg/dL 4.09  8.11  9.14   Sodium 135 - 145 mmol/L 139  140  138   Potassium 3.5 - 5.1 mmol/L 3.8  3.6  4.0   Chloride 98 - 111  mmol/L 106  107  106   CO2  22 - 32 mmol/L 23  24  24    Calcium  8.9 - 10.3 mg/dL 8.4  8.8  8.5      4/09 Cr sl elevated--might be near baseline 8. T2DM: Hgb A1C- 8.2 (PTA) Was taking Tresiba 54 units in am ( now on Semglee  22U ) and  Metformin  1000 mg--bid (restart Metformin  at 500mg  BID)   CBG (last 3)  Recent Labs    08/04/23 1655 08/04/23 2147 08/05/23 0546  GLUCAP 180* 81 123*   5/10-5/11 CBGs look great- con't regimen Improved 5/8-9  5/4 increase novolog  to 4u tid and increase to 5U 5/6    -adjust semglee  to 27 u starting 5/5- increase to 30U 5/6 AM CBGs are in range but daytime elevated , increase novalog to 6U TID 9.   HTN: Monitor BP TID--losartan  50 mg daily--was held for a few days to prevent hypoperfusion             -- parameters to hold prn sitting SBP< 120.   5/10- 5/11- BP controlled- con't regimen Vitals:   08/04/23 1959 08/05/23 0500  BP: 114/72 123/66  Pulse: 74 67  Resp: 18 18  Temp: 98.7 F (37.1 C) 98.3 F (36.8 C)  SpO2: 100% 96%    10.  Myasthenia Gravis Mycophenolate  1500/1250mg  --Dr. Franklin Ito at [facility].              Prednisone  5 mg PTA--->has been weaned down to 2 mg daily, monitor for Ptosis and generalized proximal weakness Asymptomatic for MG 5/6 11. Hypothyroid: Stable on levothyroxine  for supplement.  12.  Dyslipidemia: Was on pravastatin 80 mg daily 13. H/o Kidney stones: On Sodium bicarb BID to prevent stones.  14. Constipation: On miralax  daily             --sorbitol  added.   -last bm 5/1--> give sorbitol  today 5/4  5/10- LBM 5/8-   5/11- LBM 3 days ago- will order Sorbitol  this afternoon if doesn't go 15. H/o GERD: Has been off PPI.  16.  Mild anemia check stool guaic on ASA and Lovenox     Latest Ref Rng & Units 08/05/2023    5:33 AM 08/01/2023    6:36 AM 07/29/2023    5:36 AM  CBC  WBC 4.0 - 10.5 K/uL 6.1  7.1  7.2   Hemoglobin 13.0 - 17.0 g/dL 81.1  91.4  78.2   Hematocrit 39.0 - 52.0 % 35.2  35.4  33.7   Platelets 150 - 400 K/uL 365  407  346    Slight drop vs  4/28 but last 3 values stable     LOS: 17 days A FACE TO FACE EVALUATION WAS PERFORMED  Cecilia Coe Lisbet Busker 08/05/2023, 9:08 AM

## 2023-08-06 DIAGNOSIS — E1169 Type 2 diabetes mellitus with other specified complication: Secondary | ICD-10-CM | POA: Diagnosis not present

## 2023-08-06 DIAGNOSIS — I63511 Cerebral infarction due to unspecified occlusion or stenosis of right middle cerebral artery: Secondary | ICD-10-CM | POA: Diagnosis not present

## 2023-08-06 DIAGNOSIS — G7000 Myasthenia gravis without (acute) exacerbation: Secondary | ICD-10-CM | POA: Diagnosis not present

## 2023-08-06 DIAGNOSIS — I639 Cerebral infarction, unspecified: Secondary | ICD-10-CM | POA: Diagnosis not present

## 2023-08-06 LAB — GLUCOSE, CAPILLARY
Glucose-Capillary: 75 mg/dL (ref 70–99)
Glucose-Capillary: 129 mg/dL — ABNORMAL HIGH (ref 70–99)
Glucose-Capillary: 182 mg/dL — ABNORMAL HIGH (ref 70–99)
Glucose-Capillary: 184 mg/dL — ABNORMAL HIGH (ref 70–99)

## 2023-08-06 MED ORDER — INSULIN GLARGINE-YFGN 100 UNIT/ML ~~LOC~~ SOLN
28.0000 [IU] | Freq: Every day | SUBCUTANEOUS | Status: DC
  Administered 2023-08-07 – 2023-08-13 (×7): 28 [IU] via SUBCUTANEOUS
  Filled 2023-08-06 (×7): qty 0.28

## 2023-08-06 NOTE — Progress Notes (Signed)
 PROGRESS NOTE   Subjective/Complaints:  No issues overnite  Has WC eval today  Discussed Swedish knee cage with PT  ROS: Pt denies SOB, abd pain, CP, N/V/C/D,   Objective:   No results found. Recent Labs    08/05/23 0533  WBC 6.1  HGB 11.3*  HCT 35.2*  PLT 365       Recent Labs    08/05/23 0533  NA 139  K 3.8  CL 106  CO2 23  GLUCOSE 115*  BUN 17  CREATININE 1.19  CALCIUM  8.4*        Intake/Output Summary (Last 24 hours) at 08/06/2023 0835 Last data filed at 08/06/2023 8657 Gross per 24 hour  Intake 2160 ml  Output 300 ml  Net 1860 ml        Physical Exam: Vital Signs Blood pressure (!) 128/59, pulse 64, temperature 97.9 F (36.6 C), temperature source Oral, resp. rate 19, weight 127.7 kg, SpO2 98%.  General: No acute distress Mood and affect are appropriate Heart: Regular rate and rhythm no rubs murmurs or extra sounds Lungs: Clear to auscultation, breathing unlabored, no rales or wheezes Abdomen: Positive bowel sounds, soft nontender to palpation, nondistended Extremities: No clubbing, cyanosis, or edema  Neurologic: Cranial nerves II through XII intact, motor strength is 5/5 in right and 3-/5 left deltoid, bicep, tricep,0/5 FE and  2+ grip, 3-hip flexor, knee extensors, ankle dorsiflexor and plantar flexor. Flexor tone in left fingers/wrist.  Gait with mod assist, hyperextension at the left knee does have adequate clearance of the left foot.  No pain with ambulation Cerebellar exam remains limited by weakness on the left side  Prior neuro assessment is c/w today's exam 08/06/2023.  Musculoskeletal: Full passive  range of motion in all 4 extremities. No joint swelling   Assessment/Plan: 1. Functional deficits which require 3+ hours per day of interdisciplinary therapy in a comprehensive inpatient rehab setting. Physiatrist is providing close team supervision and 24 hour management of  active medical problems listed below. Physiatrist and rehab team continue to assess barriers to discharge/monitor patient progress toward functional and medical goals  Care Tool:  Bathing    Body parts bathed by patient: Left arm, Chest, Abdomen, Right upper leg, Left upper leg, Face, Right arm, Front perineal area, Right lower leg, Left lower leg (used long sponge to reach R arm)   Body parts bathed by helper: Buttocks     Bathing assist Assist Level: Minimal Assistance - Patient > 75%     Upper Body Dressing/Undressing Upper body dressing   What is the patient wearing?: Pull over shirt    Upper body assist Assist Level: Minimal Assistance - Patient > 75%    Lower Body Dressing/Undressing Lower body dressing      What is the patient wearing?: Pants     Lower body assist Assist for lower body dressing: Moderate Assistance - Patient 50 - 74% (used reacher to don over feet)     Editor, commissioning assist Assist for toileting: Moderate Assistance - Patient 50 - 74%     Transfers Chair/bed transfer  Transfers assist     Chair/bed transfer assist level: Minimal Assistance -  Patient > 75%     Locomotion Ambulation   Ambulation assist   Ambulation activity did not occur: Safety/medical concerns (ambulated 24' with RHR in hallway mod assist and +2 WC follow)  Assist level: Maximal Assistance - Patient 25 - 49% Assistive device:  (hand rail as well as HR and under L shoulder.) Max distance: 30   Walk 10 feet activity   Assist  Walk 10 feet activity did not occur: Safety/medical concerns  Assist level: Maximal Assistance - Patient 25 - 49% Assistive device:  (hand rail as well as HR and under L shoulder.)   Walk 50 feet activity   Assist Walk 50 feet with 2 turns activity did not occur: Safety/medical concerns         Walk 150 feet activity   Assist Walk 150 feet activity did not occur: Safety/medical concerns         Walk 10  feet on uneven surface  activity   Assist Walk 10 feet on uneven surfaces activity did not occur: Safety/medical concerns         Wheelchair     Assist Is the patient using a wheelchair?: Yes Type of Wheelchair: Manual    Wheelchair assist level: Dependent - Patient 0%      Wheelchair 50 feet with 2 turns activity    Assist        Assist Level: Dependent - Patient 0%   Wheelchair 150 feet activity     Assist      Assist Level: Dependent - Patient 0%   Blood pressure (!) 128/59, pulse 64, temperature 97.9 F (36.6 C), temperature source Oral, resp. rate 19, weight 127.7 kg, SpO2 98%.  Medical Problem List and Plan: 1. 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  Antithrombotics: -DVT/anticoagulation:  Pharmaceutical: Lovenox              -antiplatelet therapy: ASA 3. Pain Management: Tylenol  prn.  4. Mood/Behavior/Sleep: LCSW to follow for evaluation and support.              -antipsychotic agents: N/A 5. Neuropsych/cognition: This patient is capable of making decisions on his own behalf. 6. Skin/Wound Care: LCSW to follow for evaluation and support             --Gerhardt's butt cream to MASD on buttocks             --Diflucan for candida cruris.              --pressure relief measures to left elbow/left heel.  7. Fluids/Electrolytes/Nutrition:  encourage appropriate PO    Latest Ref Rng & Units 08/05/2023    5:33 AM 08/01/2023    6:36 AM 07/29/2023    5:36 AM  BMP  Glucose 70 - 99 mg/dL 474  259  563   BUN 8 - 23 mg/dL 17  16  16    Creatinine 0.61 - 1.24 mg/dL 8.75  6.43  3.29   Sodium 135 - 145 mmol/L 139  140  138   Potassium 3.5 - 5.1 mmol/L 3.8  3.6  4.0   Chloride 98 - 111 mmol/L 106  107  106   CO2 22 - 32 mmol/L 23  24  24     Calcium  8.9 - 10.3 mg/dL 8.4  8.8  8.5      1/61 Cr sl elevated--might be near baseline 8. T2DM: Hgb A1C- 8.2 (PTA) Was taking Tresiba 54 units in am ( now on Semglee  22U ) and  Metformin  1000 mg--bid (restart Metformin  at 500mg  BID)   CBG (last 3)  Recent Labs    08/05/23 1634 08/05/23 2047 08/06/23 0559  GLUCAP 147* 116* 75   5/10-5/11 CBGs look great- con't regimen Improved 5/8-9  5/4 increase novolog  to 4u tid and increase to 5U 5/6    -adjust semglee  to 27 u starting 5/5- increase to 30U 5/6 AM CBGs are in range but daytime elevated , increase novalog to 6U TID Am CBG on low side will reduce semglee  to 28U 9.   HTN: Monitor BP TID--losartan  50 mg daily--was held for a few days to prevent hypoperfusion             -- parameters to hold prn sitting SBP< 120.   5/10- 5/11- BP controlled- con't regimen Vitals:   08/05/23 1921 08/06/23 0440  BP: 130/71 (!) 128/59  Pulse: 76 64  Resp: 18 19  Temp: 98.2 F (36.8 C) 97.9 F (36.6 C)  SpO2: 98% 98%    10.  Myasthenia Gravis Mycophenolate  1500/1250mg  --Dr. Franklin Ito at [facility].              Prednisone  5 mg PTA--->has been weaned down to 2 mg daily, monitor for Ptosis and generalized proximal weakness Asymptomatic for MG 5/6 11. Hypothyroid: Stable on levothyroxine  for supplement.  12.  Dyslipidemia: Was on pravastatin 80 mg daily 13. H/o Kidney stones: On Sodium bicarb BID to prevent stones.  14. Constipation: On miralax  daily             --sorbitol  added.   -last bm 5/1--> give sorbitol  today 5/4  5/10- LBM 5/8-   5/11- LBM 3 days ago- will order Sorbitol  this afternoon if doesn't go 15. H/o GERD: Has been off PPI.  16.  Mild anemia check stool guaic on ASA and Lovenox     Latest Ref Rng & Units 08/05/2023    5:33 AM 08/01/2023    6:36 AM 07/29/2023    5:36 AM  CBC  WBC 4.0 - 10.5 K/uL 6.1  7.1  7.2   Hemoglobin 13.0 - 17.0 g/dL 09.6  04.5  40.9   Hematocrit 39.0 - 52.0 % 35.2  35.4  33.7   Platelets 150 - 400 K/uL 365  407   346   Stable     LOS: 18 days A FACE TO FACE EVALUATION WAS PERFORMED  Cecilia Coe Havah Ammon 08/06/2023, 8:35 AM

## 2023-08-06 NOTE — Progress Notes (Signed)
 Physical Therapy Weekly Progress Note  Patient Details  Name: [name]. MRN: [id number] Date of Birth: 02-02-1951  Beginning of progress report period: Jul 28, 2023 End of progress report period: Aug 06, 2023  Patient has met 2 of 5 short term goals. Pt is making slow progress towards ambulatory goals, and has had goals downgraded/discontinued due to anticipation of not becoming a functional ambulator at d/c. Pt requires mostly maxA to ambulate (some moments of modA) short distances. Pt making progress towards bed mobility goal (has bed that can adjust [facility]) and will require goal to be downgraded as well (currently supervision). Pt has participated in [facility] evaluation for PWC, therefore, will not require hemi-technique WC propulsion (this week will focus on navigating PWC with loaner from provider). Family education will also take place this week. He will also require Swedish knee cage and potential AFO for improved mechanics during continued gait training.  Patient continues to demonstrate the following deficits muscle weakness, decreased cardiorespiratoy endurance, impaired timing and sequencing, unbalanced muscle activation, decreased coordination, and decreased motor planning, field cut, decreased attention to left, decreased awareness, and decreased standing balance, decreased postural control, hemiplegia, and decreased balance strategies and therefore will continue to benefit from skilled PT intervention to increase functional independence with mobility.  Patient slowly progressing toward long term goals.but will require adjustments to LTGs.  Plan of care revisions: Stair goal has been deleted d/t lack of progress and pt's acquiring power wheelchair for indoor/ outdoor mobility. Transfer goals and bed mobility goals have been downgraded to reflect actual d/t d/t pt not prgressing as quickly as expected .  PT Short Term Goals Week 2:  PT Short Term Goals - Week 2 PT Short Term Goal 1  (Week 2): pt will perform bed mobility with mod A with LRAD PT Short Term Goal 1 - Progress (Week 2): Progressing toward goal PT Short Term Goal 2 (Week 2): pt will perform bed to chair transfer with min A with LRAD PT Short Term Goal 2 - Progress (Week 2): Met PT Short Term Goal 3 (Week 2): pt will ambulate 50 feet with LRAD and mod A PT Short Term Goal 3 - Progress (Week 2): Progressing toward goal PT Short Term Goal 4 (Week 2): pt will participate in custom WC eval PT Short Term Goal 4 - Progress (Week 2): Met PT Short Term Goal 5 (Week 2): pt will initiate WC propulsion with hemi technique PT Short Term Goal 5 - Progress (Week 2): Not met PT Short Term Goals - Week 3 PT Short Term Goal 1 (Week 3): STG = LTG d/t ELOS  Skilled Therapeutic Interventions/Progress Updates: Ambulation/gait training;Discharge planning;Functional mobility training;Psychosocial support;Therapeutic Activities;Visual/perceptual remediation/compensation;Balance/vestibular training;Disease management/prevention;Neuromuscular re-education;Therapeutic Exercise;Wheelchair propulsion/positioning;Cognitive remediation/compensation;DME/adaptive equipment instruction;Pain management;Splinting/orthotics;UE/LE Strength taining/ROM;Community reintegration;Functional electrical stimulation;Patient/family education;Stair training;UE/LE Coordination activities;Skin care/wound management   Therapy Documentation Precautions:  Precautions Precautions: Fall Precaution/Restrictions Comments: falls, L hemi, mild L sided inattention with L lateral lean Restrictions Weight Bearing Restrictions Per Provider Order: No  Therapy/Group: Individual Therapy  Dominic Sandoval PTA Donne Gage PT, DPT, CSRS 08/06/2023, 3:33 PM

## 2023-08-06 NOTE — Progress Notes (Signed)
 Occupational Therapy Session Note  Patient Details  Name: [name]. MRN: [id number] Date of Birth: March 02, 1951  Today's Date: 08/06/2023 OT Individual Time: 6295-2841 OT Individual Time Calculation (min): 47 min    Short Term Goals: Week 3:  OT Short Term Goal 1 (Week 3): Pt will don a shirt with supervision. OT Short Term Goal 2 (Week 3): Pt will be able to pull pants over hips with 20% help or less. OT Short Term Goal 3 (Week 3): Pt will complete stand pivot transfers to toilet with RW with min A.  Skilled Therapeutic Interventions/Progress Updates:    Pt received sitting in the w/c with no c/o pain, agreeable to OT session. His wife [name] was present and observed throughout session. Started session with practicing toilet transfers, simulating home environment and set up. He completed 2x stand pivot using grab bar to and from the [facility] over the toilet <> w/c. CGA overall provided with cueing provided for body awareness. He attempted to hemi propel the w/c and had great difficulty- mod A required. He was taken to the gym dependently the rest of the way. Focused on LUE wrist and finger extension with use of e-stim and prolonged weightbearing in standing. Mod facilitation provided. Pt intermittently reported pain in anterior forearm with extended fingers/wrist. He returned to his room and was left sitting up in the w/c with all needs met.   Ratio 1:3 Rate 35 pps Waveform- Asymmetric Ramp 1.0 Pulse 300 Intensity- 42 Duration - 15 min  Report of pain at the beginning of session 0/10 Report of pain at the end of session 0/10  No adverse reactions after treatment and is skin intact.    Therapy Documentation Precautions:  Precautions Precautions: Fall Precaution/Restrictions Comments: falls, L hemi, mild L sided inattention with L lateral lean Restrictions Weight Bearing Restrictions Per Provider Order: No  Therapy/Group: Individual Therapy  Una Ganser 08/06/2023, 8:01  AM

## 2023-08-06 NOTE — Progress Notes (Signed)
 Physical Therapy Session Note  Patient Details  Name: [name]. MRN: [id number] Date of Birth: 17-Feb-1951  Today's Date: 08/06/2023 PT Individual Time: 4098-1191 PT Individual Time Calculation (min): 27 min   Short Term Goals: Week 2:  PT Short Term Goal 1 (Week 2): pt will perform bed mobility with mod A with LRAD PT Short Term Goal 2 (Week 2): pt will perform bed to chair transfer with min A with LRAD PT Short Term Goal 3 (Week 2): pt will ambulate 50 feet with LRAD and mod A PT Short Term Goal 4 (Week 2): pt will participate in custom WC eval PT Short Term Goal 5 (Week 2): pt will initiate WC propulsion with hemi technique  Skilled Therapeutic Interventions/Progress Updates: Patient sitting in [facility] with wife present on entrance to room. Patient alert and agreeable to PT session.   Patient reported no pain during session. Pt excited about how well WC evaluation went prior to PTA arrival. PTA discussed that this week will focus on PWC navigation and transfers to get pt and pt wife ready for safe d/c home next week. Pt transported to day room gym in [facility] dependently. Pt performed sit<>stand pivot transfers with RW and with overall CGA/minA (CGA to stand when pt sets up mechanics correctly, and CGA to pivot when following cue to avoid anterior lean and to maintain safe proximity of RW, otherwise, pt required minA). Pt performed x 2 to L and R. Pt wife participated in 2nd round of transfers with PTA providing CGA and VC for pt wife placement of hand's, body and LE's. Pt transported back to room in [facility] where hanger rep arrived to assess fit for swedish knee cage for L LE (will bring back to adjust size). Pt performed stand pivot without AD to R with overall modA and cues for weight shift. Pt positioned with ROHO on recliner to improve comfortability and to avoid skin break down.  Patient sitting in recliner at end of session with brakes locked, wife present, chair alarm set, and all needs  within reach.      Therapy Documentation Precautions:  Precautions Precautions: Fall Precaution/Restrictions Comments: falls, L hemi, mild L sided inattention with L lateral lean Restrictions Weight Bearing Restrictions Per Provider Order: No   Therapy/Group: Individual Therapy  Taralee Marcus PTA 08/06/2023, 3:21 PM

## 2023-08-06 NOTE — Progress Notes (Signed)
 Speech Language Pathology Daily Session Note  Patient Details  Name: [name]. MRN: [id number] Date of Birth: Apr 17, 1950  Today's Date: 08/06/2023 SLP Individual Time: 1002-1100 SLP Individual Time Calculation (min): 58 min  Short Term Goals: Week 3: SLP Short Term Goal 1 (Week 3): STG = LTG due to ELOS  Skilled Therapeutic Interventions: Skilled therapy session focused on cognitive goals. SLP faciliated session by providing min-modA during deductive reasoning task. SLP encouraged use of organizational skills, problem solving, and emergent awareness to check work per instructions. Patient independently aware of increased difficulty during this task compared to prior. SLP continued to target cognitive goals through time and money word problems. Patient required min verbal A to complete. Patient left in chair with alarm set and call bell in reach. Continue POC.   Pain None reported   Therapy/Group: Individual Therapy  Teague Goynes M.A., CCC-SLP 08/06/2023, 7:49 AM

## 2023-08-07 DIAGNOSIS — I639 Cerebral infarction, unspecified: Secondary | ICD-10-CM | POA: Diagnosis not present

## 2023-08-07 DIAGNOSIS — E1169 Type 2 diabetes mellitus with other specified complication: Secondary | ICD-10-CM | POA: Diagnosis not present

## 2023-08-07 DIAGNOSIS — G7000 Myasthenia gravis without (acute) exacerbation: Secondary | ICD-10-CM | POA: Diagnosis not present

## 2023-08-07 DIAGNOSIS — I63511 Cerebral infarction due to unspecified occlusion or stenosis of right middle cerebral artery: Secondary | ICD-10-CM | POA: Diagnosis not present

## 2023-08-07 LAB — GLUCOSE, CAPILLARY
Glucose-Capillary: 104 mg/dL — ABNORMAL HIGH (ref 70–99)
Glucose-Capillary: 156 mg/dL — ABNORMAL HIGH (ref 70–99)
Glucose-Capillary: 177 mg/dL — ABNORMAL HIGH (ref 70–99)
Glucose-Capillary: 158 mg/dL — ABNORMAL HIGH (ref 70–99)

## 2023-08-07 NOTE — Progress Notes (Signed)
 Physical Therapy Session Note  Patient Details  Name: [name]. MRN: [id number] Date of Birth: 04-26-1950  Today's Date: 08/07/2023 PT Individual Time: 6578-4696; 1305 - 1430 PT Individual Time Calculation (min): 27 min; 85 min   Short Term Goals: Week 3:  PT Short Term Goal 1 (Week 3): STG = LTG d/t ELOS  SESSION 1 Skilled Therapeutic Interventions/Progress Updates: Patient sitting in [name] following conversation with MD and wife present on entrance to room. Patient alert and agreeable to PT session.   Patient with no complaints of pain during session. Session focused on bed mobility in preparation for d/c next week. Pt performed stand pivot transfers from WC<>EOB with minA and use of HOB railing (PTA encouraged installment of railing at home as pt stated that his brother can assist - [name] has option to elevate). Pt performed sit<>supine from EOB with overall minA and use of bed features and VC to self adjust in bed by abducting LE's accordingly. Pt, PTA and wife discussed performing car transfer in pt's actual car tomorrow morning as car simulator on inpatient floor is not sufficient to pt's body type.   Patient sitting in WC at end of session with brakes locked, wife present and nsg present providing medication and all needs within reach.  SESSION 2 Skilled Therapeutic Interventions/Progress Updates: Patient sitting in [name] with hanger rep present on entrance to room. Patient alert and agreeable to PT session.   Patient reported no pain during session. Beginning of session focused on adjusting swedish knee cage on pt's L LE with education provided (pt requires assistance to donn due to decreased use of L UE). Pt also provided with further education on ensuring that L LE is underneath knee when preparing to stand, as well as R foot in neutral line with L to avoid ground reaction force increasing hyperextension on L LE, which can lead to requiring modA to stand, or to retro-pulse. Pt  ambulated short distance in room with modA and moment of heavy modA to prevent anterior LOB (pt with decreased weight shift and maintaining safe proximity of RW). Pt transported from room<day room gym in [name] and performed stand pivot transfers x 2 from WC<>edge of mat with minA and cues for mechanics/set up. Pt cued to scoot anteriorly with L LE planted on floor to assist with placing foot under L knee. Pt tends to bring R LE back to increase WB on non-paretic side with cues to increase awareness to this as this will increase hyperextension on L.  [employer] rep arrived with pt's PWC. Pt educated on PWC features and how to maneuver and was fit to pt size. Pt required modA to scoot posteriorly in PWC with VC to lean forward. Pt with decreased attention to L, and will continue to work on PWC mobility prior to d/c, including long distances, ramps and community navigation. Pt educated on reclining aspect to decrease risk of pressure injuries. Pt navigated back to room in [name] with supervision and VC to increase awareness to L side to avoid bumping into objects, and to avoid scrapping L UE on wall. Pt thankful for PWC to improve community integration with friends and family, to maneuver around household and to increase independence of pressure relief as pt is not d/c as functional ambulator.   Patient sitting in PWC at end of session with brakes locked, and all needs within reach.       Therapy Documentation Precautions:  Precautions Precautions: Fall Precaution/Restrictions Comments: falls, L hemi, mild  L sided inattention with L lateral lean Restrictions Weight Bearing Restrictions Per Provider Order: No  Therapy/Group: Individual Therapy  Quinntin Malter PTA 08/07/2023, 12:47 PM

## 2023-08-07 NOTE — Patient Care Conference (Signed)
 Inpatient RehabilitationTeam Conference and Plan of Care Update Date: 08/07/2023   Time: 10:10 AM    Patient Name: [name].      Medical Record Number: [id number]  Date of Birth: [birth year] Sex: Male         Room/Bed: 4W22C/4W22C-01 Payor Info: Payor: Advertising copywriter MEDICARE / Plan: [facility] MEDICARE / Product Type: *No Product type* /    Admit Date/Time:  07/19/2023 12:07 PM  Primary Diagnosis:  Acute ischemic right MCA stroke [facility])  Hospital Problems: Principal Problem:   Acute ischemic right MCA stroke [facility])    Expected Discharge Date: Expected Discharge Date: 08/13/23  Team Members Present: Physician leading conference: Dr. Janeece Mechanic Social Worker Present: Norval Been, LCSW Nurse Present: Forrestine Ike, RN PT Present: Oma Bias, PT;Dominic Sandoval, PTA OT Present: Florina Husbands, OT SLP Present: Reggie Caper, SLP PPS Coordinator present : Jestine Moron, SLP     Current Status/Progress Goal Weekly Team Focus  Bowel/Bladder   continent of b/b. LBM 5/13   maintain continence   timed toileting    Swallow/Nutrition/ Hydration               ADL's   MIN A UB ADLS, MODA LB ADLS, MIN A for stand pivot transfers with RW. continues to develop LUE movement but unable to integrate fully into ADLS at this time. able to use gross grasp but unable to extend digits   Min A overall   family ed, transfer training, BADL reeducation, DME needs    Mobility   Bed mobility - minA (HOB elevated), transfers = minA overall with RW; Gait - still overall maxA with anterior lean and decreased weight shifting B.   CGA/minA transfers, supervision bed mobility, and modI WC propulsion - will need to downgrade bed mobility goal  Pt/family education, gait, transfers, bed mobility, PWC navigation    Communication                Safety/Cognition/ Behavioral Observations  minA   supervision   mildly complex problem solving, attention, STM    Pain   no  c/o pain   maintain pain free   assess pain qshift and PRN    Skin   abrasion on arms. dressings changed 5/13   maintain infection free skin tears  assess skin qshift and PRN change dressings per order      Discharge Planning:  D/c to home with his wife. SW will confirm there are no barriersto discharge.   Team Discussion: Patient post right MCA CVA; medically stable per MD with history of MG.  Patient on target to meet rehab goals: yes, currently needs min assist for upper body care and mod assist for lower body care. Completes stand pivot transfers with min assist using a RW.  Note some return of left upper extremity movement and gross grasp; although unable to incorporate into self care. Needs min assist for memory and mildly complex problem solving. Goals for discharge set for min assist overall.  *See Care Plan and progress notes for long and short-term goals.   Revisions to Treatment Plan:  Swedish knee cage Power W/C evaluation; for pressure relief and chronic back pain Hold on Botox due to history of MG  Teaching Needs: Safety, medications, dietary modification, transfers, toileting, etc.   Current Barriers to Discharge: Decreased caregiver support and Home enviroment access/layout  Possible Resolutions to Barriers: Family education OP follow up services Ramp for entry to home DME: [facility]edy, [facility]  Medical Summary Current Status: Left hemiparesis, still needing mod/minA,  Barriers to Discharge: Medical stability;Morbid Obesity   Possible Resolutions to [employer] Focus: orthotic consult pending, pt will buy stedy lift   Continued Need for Acute Rehabilitation Level of Care: The patient requires daily medical management by a physician with specialized training in physical medicine and rehabilitation for the following reasons: Direction of a multidisciplinary physical rehabilitation program to maximize functional independence : Yes Medical management of  patient stability for increased activity during participation in an intensive rehabilitation regime.: Yes Analysis of laboratory values and/or radiology reports with any subsequent need for medication adjustment and/or medical intervention. : Yes   I attest that I was present, lead the team conference, and concur with the assessment and plan of the team.   Forrestine Ike B 08/08/2023, 3:26 PM

## 2023-08-07 NOTE — Progress Notes (Signed)
 PROGRESS NOTE   Subjective/Complaints:  Discussed purchase of stedi lift which given mobility issues and weight would be helpful to avoid falls and caregiver injury   ROS: Pt denies SOB, abd pain, CP, N/V/C/D,   Objective:   No results found. Recent Labs    08/05/23 0533  WBC 6.1  HGB 11.3*  HCT 35.2*  PLT 365       Recent Labs    08/05/23 0533  NA 139  K 3.8  CL 106  CO2 23  GLUCOSE 115*  BUN 17  CREATININE 1.19  CALCIUM  8.4*        Intake/Output Summary (Last 24 hours) at 08/07/2023 0831 Last data filed at 08/06/2023 1249 Gross per 24 hour  Intake 480 ml  Output --  Net 480 ml        Physical Exam: Vital Signs Blood pressure 128/88, pulse 64, temperature 97.8 F (36.6 C), temperature source Oral, resp. rate 19, weight 127.7 kg, SpO2 98%.  General: No acute distress Mood and affect are appropriate Heart: Regular rate and rhythm no rubs murmurs or extra sounds Lungs: Clear to auscultation, breathing unlabored, no rales or wheezes Abdomen: Positive bowel sounds, soft nontender to palpation, nondistended Extremities: No clubbing, cyanosis, or edema  Neurologic: Cranial nerves II through XII intact, motor strength is 5/5 in right and 3-/5 left deltoid, bicep, tricep,0/5 FE and  2+ grip, 3-hip flexor, knee extensors, ankle dorsiflexor and plantar flexor. Flexor tone in left fingers/wrist.  Gait with mod assist, hyperextension at the left knee does have adequate clearance of the left foot.  No pain with ambulation Cerebellar exam remains limited by weakness on the left side  Prior neuro assessment is c/w today's exam 08/07/2023.  Musculoskeletal: Full passive  range of motion in all 4 extremities. No joint swelling   Assessment/Plan: 1. Functional deficits which require 3+ hours per day of interdisciplinary therapy in a comprehensive inpatient rehab setting. Physiatrist is providing close team  supervision and 24 hour management of active medical problems listed below. Physiatrist and rehab team continue to assess barriers to discharge/monitor patient progress toward functional and medical goals  Care Tool:  Bathing    Body parts bathed by patient: Left arm, Chest, Abdomen, Right upper leg, Left upper leg, Face, Right arm, Front perineal area, Right lower leg, Left lower leg (used long sponge to reach R arm)   Body parts bathed by helper: Buttocks     Bathing assist Assist Level: Minimal Assistance - Patient > 75%     Upper Body Dressing/Undressing Upper body dressing   What is the patient wearing?: Pull over shirt    Upper body assist Assist Level: Minimal Assistance - Patient > 75%    Lower Body Dressing/Undressing Lower body dressing      What is the patient wearing?: Pants     Lower body assist Assist for lower body dressing: Moderate Assistance - Patient 50 - 74% (used reacher to don over feet)     Editor, commissioning assist Assist for toileting: Moderate Assistance - Patient 50 - 74%     Transfers Chair/bed transfer  Transfers assist     Chair/bed transfer  assist level: Minimal Assistance - Patient > 75%     Locomotion Ambulation   Ambulation assist   Ambulation activity did not occur: Safety/medical concerns (ambulated 25' with RHR in hallway mod assist and +2 WC follow)  Assist level: Maximal Assistance - Patient 25 - 49% Assistive device:  (hand rail as well as HR and under L shoulder.) Max distance: 30   Walk 10 feet activity   Assist  Walk 10 feet activity did not occur: Safety/medical concerns  Assist level: Maximal Assistance - Patient 25 - 49% Assistive device:  (hand rail as well as HR and under L shoulder.)   Walk 50 feet activity   Assist Walk 50 feet with 2 turns activity did not occur: Safety/medical concerns         Walk 150 feet activity   Assist Walk 150 feet activity did not occur:  Safety/medical concerns         Walk 10 feet on uneven surface  activity   Assist Walk 10 feet on uneven surfaces activity did not occur: Safety/medical concerns         Wheelchair     Assist Is the patient using a wheelchair?: Yes Type of Wheelchair: Manual    Wheelchair assist level: Dependent - Patient 0%      Wheelchair 50 feet with 2 turns activity    Assist        Assist Level: Dependent - Patient 0%   Wheelchair 150 feet activity     Assist      Assist Level: Dependent - Patient 0%   Blood pressure 128/88, pulse 64, temperature 97.8 F (36.6 C), temperature source Oral, resp. rate 19, weight 127.7 kg, SpO2 98%.  Medical Problem List and Plan: 1. Functional deficits secondary to right MCA infarct             -patient may shower             -ELOS/Goals: 5/20 supervision goals with PT and SLP and sup/min assist with OT         Con't CIR, team conf in am   Team conference today please see physician documentation under team conference tab, met with team  to discuss problems,progress, and goals. Formulized individual treatment plan based on medical history, underlying problem and comorbidities.  orthotics consult for hyperextension at the knee 2.  Antithrombotics: -DVT/anticoagulation:  Pharmaceutical: Lovenox              -antiplatelet therapy: ASA 3. Pain Management: Tylenol  prn.  4. Mood/Behavior/Sleep: LCSW to follow for evaluation and support.              -antipsychotic agents: N/A 5. Neuropsych/cognition: This patient is capable of making decisions on his own behalf. 6. Skin/Wound Care: LCSW to follow for evaluation and support             --Gerhardt's butt cream to MASD on buttocks             --Diflucan for candida cruris.              --pressure relief measures to left elbow/left heel.  7. Fluids/Electrolytes/Nutrition:  encourage appropriate PO    Latest Ref Rng & Units 08/05/2023    5:33 AM 08/01/2023    6:36 AM 07/29/2023    5:36 AM   BMP  Glucose 70 - 99 mg/dL 086  578  469   BUN 8 - 23 mg/dL 17  16  16    Creatinine 0.61 - 1.24 mg/dL  1.19  1.24  1.25   Sodium 135 - 145 mmol/L 139  140  138   Potassium 3.5 - 5.1 mmol/L 3.8  3.6  4.0   Chloride 98 - 111 mmol/L 106  107  106   CO2 22 - 32 mmol/L 23  24  24    Calcium  8.9 - 10.3 mg/dL 8.4  8.8  8.5      1/61 Cr sl elevated--might be near baseline 8. T2DM: Hgb A1C- 8.2 (PTA) Was taking Tresiba 54 units in am ( now on Semglee  22U ) and  Metformin  1000 mg--bid (restart Metformin  at 500mg  BID)   CBG (last 3)  Recent Labs    08/06/23 1705 08/06/23 2041 08/07/23 0611  GLUCAP 182* 184* 104*   5/10-5/11 CBGs look great- con't regimen Improved 5/8-9  5/4 increase novolog  to 4u tid and increase to 5U 5/6    -adjust semglee  to 27 u starting 5/5- increase to 30U 5/6 AM CBGs are in range but daytime elevated , increase novalog to 6U TID Am CBG on low side will reduce semglee  to 28U 9.   HTN: Monitor BP TID--losartan  50 mg daily--was held for a few days to prevent hypoperfusion             -- parameters to hold prn sitting SBP< 120.   5/10- 5/11- BP controlled- con't regimen Vitals:   08/06/23 1945 08/07/23 0438  BP: 133/66 128/88  Pulse: 70 64  Resp: 18 19  Temp: 98.8 F (37.1 C) 97.8 F (36.6 C)  SpO2: 98% 98%    10.  Myasthenia Gravis Mycophenolate  1500/1250mg  --Dr. Franklin Ito at [facility].              Prednisone  5 mg PTA--->has been weaned down to 2 mg daily, monitor for Ptosis and generalized proximal weakness Asymptomatic for MG 5/6 11. Hypothyroid: Stable on levothyroxine  for supplement.  12.  Dyslipidemia: Was on pravastatin 80 mg daily 13. H/o Kidney stones: On Sodium bicarb BID to prevent stones.  14. Constipation: On miralax  daily             --sorbitol  added.   -last bm 5/1--> give sorbitol  today 5/4  5/10- LBM 5/8-   5/11- LBM 3 days ago- will order Sorbitol  this afternoon if doesn't go 15. H/o GERD: Has been off PPI.  16.  Mild anemia check stool guaic on  ASA and Lovenox     Latest Ref Rng & Units 08/05/2023    5:33 AM 08/01/2023    6:36 AM 07/29/2023    5:36 AM  CBC  WBC 4.0 - 10.5 K/uL 6.1  7.1  7.2   Hemoglobin 13.0 - 17.0 g/dL 09.6  04.5  40.9   Hematocrit 39.0 - 52.0 % 35.2  35.4  33.7   Platelets 150 - 400 K/uL 365  407  346   Stable     LOS: 19 days A FACE TO FACE EVALUATION WAS PERFORMED  Cecilia Coe Tamer Baughman 08/07/2023, 8:31 AM

## 2023-08-07 NOTE — Progress Notes (Signed)
 Speech Language Pathology Daily Session Note  Patient Details  Name: [name]. MRN: [id number] Date of Birth: 11-02-1950  Today's Date: 08/07/2023 SLP Individual Time: 0900-0958 SLP Individual Time Calculation (min): 58 min  Short Term Goals: Week 3: SLP Short Term Goal 1 (Week 3): STG = LTG due to ELOS  Skilled Therapeutic Interventions: Skilled therapy session focused on cognitive goals. SLP facilitated session by providing supervision A during patient completion of simple and complex multiplication. SLP also targeted memory goals through paragraph comprehension task. Patient with 90% accuracy given minA. Patient left in chair with alarm set and call bell in reach. Continue POC   Pain Denies  Therapy/Group: Individual Therapy  Christorpher Hisaw M.A., CCC-SLP 08/07/2023, 7:40 AM

## 2023-08-07 NOTE — Progress Notes (Signed)
 Occupational Therapy Session Note  Patient Details  Name: [name]. MRN: [id number] Date of Birth: November 02, 1950  Today's Date: 08/07/2023 OT Individual Time: 4098-1191 OT Individual Time Calculation (min): 42 min    Short Term Goals: Week 3:  OT Short Term Goal 1 (Week 3): Pt will don a shirt with supervision. OT Short Term Goal 2 (Week 3): Pt will be able to pull pants over hips with 20% help or less. OT Short Term Goal 3 (Week 3): Pt will complete stand pivot transfers to toilet with RW with min A.  Skilled Therapeutic Interventions/Progress Updates:  Pt greeted seated in w/c, pt agreeable to OT intervention.      Transfers/bed mobility/functional mobility:  Pt completed sit>stands with MIN A from w/c level, assist needed to place LUE into saddle splint. Pt completed 2 stand pivot transfers from w/c<>EOM with Rw and MINA.    NMR:  -pt completed modified push ups on bench positioned in front of pt from EOM with a focus on LUE strengthening and NMRE to LUE. Pt completed 2 x10 reps. -pt completed functional reaching task with pt instructed to lean onto affected LUE for NMRE while retrieving items from lateral sides of mat and floor level to promote weightbearing through LUE and challenge dynamic sitting balance. Supervision to complete task from EOM.  -pt completed functional reaching task with dual challenge of having pt hold onto cone and then reach to target to challenge scapular mobility. Pt completed task with supervision but MIN cues for technique and set- up.  -pt also able to reach to target with L hand positioned in hand scooter during closed chain exercise, emphasis on reaching to target and providing NMRE to LUE.    Ended session with pt seated in w/c with all needs within reach.    Therapy Documentation Precautions:  Precautions Precautions: Fall Precaution/Restrictions Comments: falls, L hemi, mild L sided inattention with L lateral lean Restrictions Weight  Bearing Restrictions Per Provider Order: No  Pain:no pain     Therapy/Group: Individual Therapy  Willadean Hark 08/07/2023, 12:19 PM

## 2023-08-07 NOTE — Progress Notes (Signed)
 Physical Therapy Session Note  Patient Details  Name: [name]. MRN: [id number] Date of Birth: [birth year]  Today's Date: 08/06/2023 PT Individual Time: 4098-1191 PT Individual Time Calculation (min): 72 min  Short Term Goals: Week 1:  PT Short Term Goal 1 (Week 1): Pt will complete bed mobility mod assist PT Short Term Goal 1 - Progress (Week 1): Progressing toward goal PT Short Term Goal 2 (Week 1): Pt will complete sit<>stand transfers with LRAD with min assist PT Short Term Goal 2 - Progress (Week 1): Progressing toward goal PT Short Term Goal 3 (Week 1): Pt will complete bed<>chair transfers with LRAD with mod assist PT Short Term Goal 3 - Progress (Week 1): Progressing toward goal PT Short Term Goal 4 (Week 1): Pt will ambulate with LRAD 94' with mod assist PT Short Term Goal 4 - Progress (Week 1): Progressing toward goal Week 2:  PT Short Term Goal 1 (Week 2): pt will perform bed mobility with mod A with LRAD PT Short Term Goal 1 - Progress (Week 2): Progressing toward goal PT Short Term Goal 2 (Week 2): pt will perform bed to chair transfer with min A with LRAD PT Short Term Goal 2 - Progress (Week 2): Met PT Short Term Goal 3 (Week 2): pt will ambulate 50 feet with LRAD and mod A PT Short Term Goal 3 - Progress (Week 2): Progressing toward goal PT Short Term Goal 4 (Week 2): pt will participate in custom WC eval PT Short Term Goal 4 - Progress (Week 2): Met PT Short Term Goal 5 (Week 2): pt will initiate WC propulsion with hemi technique PT Short Term Goal 5 - Progress (Week 2): Not met Week 3:  PT Short Term Goal 1 (Week 3): STG = LTG d/t ELOS  Skilled Therapeutic Interventions/Progress Updates:  Details of injury: right MCA infarct, hx of myasthenia gravis, cervical and lumbar fixations  Lifelong user of MWC? yes  Will require a power wheelchair to achieve independence with mobility and to perform MRADLs within a reasonable time frame in appropriate areas of  the home.   He currently has open healing wounds on BUE.   D/t LLE positioning into abd/ ER and increased extensor tone in LLE, as well as need for assistance in managing clothing with transfers to toilet, he will require a skin protectant/ positioning cushion.   Verena Glaser, ATP present for custom manual wheelchair evaluation.    Therapist, patient, pt's wife, and ATP discussed the following necessities for pt's custom wheelchair:  decision to select power mobility base with power tilt and elevating options d/t pt's recent stroke in addition to hx of above mentioned comorbidities and pt's desire for increased independence inside as well as over yard outdoors with minimal assistance from wife in order to maintain participation in gardening and farming activities within his abilities.  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 ATP planning to provide loaner PWC Wednesday 5/14 at 2pm. Pt dependently transported via w/c back to room. Wife present.   Therapy Documentation Precautions:  Precautions Precautions: Fall Precaution/Restrictions Comments: falls, L hemi, mild L sided inattention with L lateral  lean Restrictions Weight Bearing Restrictions Per Provider Order: No  Pain:  No pain related this session.    Therapy/Group: Individual Therapy  Donne Gage PT, DPT, CSRS 08/06/2023, 6:53 PM

## 2023-08-08 DIAGNOSIS — I639 Cerebral infarction, unspecified: Secondary | ICD-10-CM | POA: Diagnosis not present

## 2023-08-08 DIAGNOSIS — E1169 Type 2 diabetes mellitus with other specified complication: Secondary | ICD-10-CM | POA: Diagnosis not present

## 2023-08-08 DIAGNOSIS — I63511 Cerebral infarction due to unspecified occlusion or stenosis of right middle cerebral artery: Secondary | ICD-10-CM | POA: Diagnosis not present

## 2023-08-08 DIAGNOSIS — G7000 Myasthenia gravis without (acute) exacerbation: Secondary | ICD-10-CM | POA: Diagnosis not present

## 2023-08-08 LAB — CBC
HCT: 33 % — ABNORMAL LOW (ref 39.0–52.0)
Hemoglobin: 10.7 g/dL — ABNORMAL LOW (ref 13.0–17.0)
MCH: 30.1 pg (ref 26.0–34.0)
MCHC: 32.4 g/dL (ref 30.0–36.0)
MCV: 93 fL (ref 80.0–100.0)
Platelets: 339 10*3/uL (ref 150–400)
RBC: 3.55 MIL/uL — ABNORMAL LOW (ref 4.22–5.81)
RDW: 12.7 % (ref 11.5–15.5)
WBC: 7.5 10*3/uL (ref 4.0–10.5)
nRBC: 0 % (ref 0.0–0.2)

## 2023-08-08 LAB — BASIC METABOLIC PANEL WITH GFR
Anion gap: 8 (ref 5–15)
BUN: 17 mg/dL (ref 8–23)
CO2: 23 mmol/L (ref 22–32)
Calcium: 8.4 mg/dL — ABNORMAL LOW (ref 8.9–10.3)
Chloride: 110 mmol/L (ref 98–111)
Creatinine, Ser: 1.09 mg/dL (ref 0.61–1.24)
GFR, Estimated: >60 mL/min (ref >60)
Glucose, Bld: 134 mg/dL — ABNORMAL HIGH (ref 70–99)
Potassium: 3.7 mmol/L (ref 3.5–5.1)
Sodium: 141 mmol/L (ref 135–145)

## 2023-08-08 LAB — GLUCOSE, CAPILLARY
Glucose-Capillary: 142 mg/dL — ABNORMAL HIGH (ref 70–99)
Glucose-Capillary: 194 mg/dL — ABNORMAL HIGH (ref 70–99)
Glucose-Capillary: 164 mg/dL — ABNORMAL HIGH (ref 70–99)
Glucose-Capillary: 121 mg/dL — ABNORMAL HIGH (ref 70–99)

## 2023-08-08 NOTE — Progress Notes (Signed)
 Physical Therapy Session Note  Patient Details  Name: [name]. MRN: [id number] Date of Birth: [birth year]  Today's Date: 08/08/2023 PT Individual Time: 1330-1430 PT Individual Time Calculation (min): 60 min   Short Term Goals: Week 3:  PT Short Term Goal 1 (Week 3): STG = LTG d/t ELOS  Skilled Therapeutic Interventions/Progress Updates:    Pt received in PWC, agreeable to therapy. No complaint of pain. During session, pt found to have 1 spot on forearm and x 2 spots on L lower leg weeping/bleeding. Nsg made aware, cleaned and applied mepilex dressing.   Pt requesting to stand in order to get weight off buttocks. Pt assisted into standing with RW x 2 and performed x 2 bouts of standing marches. Min a to stand and CGA in static standing x 2 min. X1 instance LOB requiring max a uncontrolled descent to chair. Discussed pressure relief time frames, pt expressed understanding.   Pt navigated w/c with supervision throughout session. Intermittent VC to watch L side, with improving carryover and pt able to self correct by end of session. Pt weaved through bowling pins placed for tight turns to facilitate improved navigation in home environment. Initially mod cues to avoid fading to supervision. Pt then retrieved cones on R side with no assist. Attempted with LUE but unable to extend fingers wide enough for pin at this time.   Pt returned to room and discussed chair level pressure relief. Provided education on order of chair functions to reduce shear forces and how far to tilt to reduce pressure on buttocks. Pt expressed understanding. Assisted pt with blind turn to get back into position, pt was left with needs in reach.     Therapy Documentation Precautions:  Precautions Precautions: Fall Precaution/Restrictions Comments: falls, L hemi, mild L sided inattention with L lateral lean Restrictions Weight Bearing Restrictions Per Provider Order: No General:       Therapy/Group:  Individual Therapy  Leslieanne Cobarrubias C Deyra Perdomo 08/08/2023, 4:09 PM

## 2023-08-08 NOTE — Progress Notes (Signed)
 Physical Therapy Session Note  Patient Details  Name: [name]. MRN: [id number] Date of Birth: [birth year]  Today's Date: 08/08/2023 PT Individual Time: 0804-0918 PT Individual Time Calculation (min): 74 min   Short Term Goals: Week 3:  PT Short Term Goal 1 (Week 3): STG = LTG d/t ELOS  Skilled Therapeutic Interventions/Progress Updates: Patient sitting in PWC with wife present on entrance to room. Patient alert and agreeable to PT session.   Patient reported no pain during session. Session focused heavily on functional/safe transfer from PWC<>pt's personal car. Pt wife brought [employer] SUV to main entrance. Pt navigated PWC from room<>main entrance with increased time required as pt maneuvered it on lowest speed to get used to it. Pt required supervision when in straight line down hallway and turns around corners, but required CGA to move L UE when about to graze by object with VC required throughout to increase awareness to L side. Pt given VC in elevator to safely turn PWC 180* as it is mid-wheel drive. Pt performed car transfer with PTA assisting first round, then pt wife on 2nd. Pt with overall CGA to transfer from PWC<>passenger side seat with Pt using door to stabilize (pt did so safely), and use of top handle of car when pivoting in seat. Pt does require minA to move L LE into car due to increased tone and VC to relax LE while PTA or wife assisting in. Increased time and education required to ensure that pt transfers safely with pt anterior weight shift tendency, and to ensure footing in safe/neutral position prior to pivoting. No use of RW due to spacing. Pt and pt wife also guided in donning pt's L swedish knee cage with pt requiring total A to donn. PTA also educated on PWC capability to go into neutral incase the battery dies, or to safely move closer to passenger door. Pt and wife feel confident in pt's d/c home, and transfers to get to OPPT.  Patient sitting in PWC at end of  session with brakes locked, and all needs within reach.      Therapy Documentation Precautions:  Precautions Precautions: Fall Precaution/Restrictions Comments: falls, L hemi, mild L sided inattention with L lateral lean Restrictions Weight Bearing Restrictions Per Provider Order: No  Therapy/Group: Individual Therapy  Treniece Holsclaw PTA 08/08/2023, 1:00 PM

## 2023-08-08 NOTE — Progress Notes (Signed)
 Occupational Therapy Session Note  Patient Details  Name: [name]. MRN: [id number] Date of Birth: [birth year]  Today's Date: 08/08/2023 OT Individual Time: 4782-9562 OT Individual Time Calculation (min): 70 min    Short Term Goals: Week 3:  OT Short Term Goal 1 (Week 3): Pt will don a shirt with supervision. OT Short Term Goal 2 (Week 3): Pt will be able to pull pants over hips with 20% help or less. OT Short Term Goal 3 (Week 3): Pt will complete stand pivot transfers to toilet with RW with min A.  Skilled Therapeutic Interventions/Progress Updates:  Pt greeted seated in PWC, pt agreeable to OT intervention.       ADLs:  Transfers: practiced stand pivot transfers in bathroom from [facility] with pt using grab bar to stand and pivot to R side to [facility] and to also simulate transfer into walkin shower at home. Pt completed transfer with MINA. Pt did require MIN cues for safe set- up of PWC during transfer I.e folding in foot plate.    IADLS:practiced maneuvering in apt to collect items from various surfaces heights using reacher as needed. Additionally practiced maneuvering PWC in tight spaces, pt completed task with supervision.   Pt also able to stand to sink from [facility] with MIN A and reach OH to place items in cabinets, CGA for balance. Education provided on energy conservations in kitchen I.e positioning PWC set- up of frequently used IADL items.    NMR: practiced targeted steps at // bars with pt instructed to step to targets on L side with BUE support from bars with a focus on activating hip/knee flexion during targeted steps to decrease foot drag and promote optimal joint positioning. Pt completed task with 70% accuracy but MIN A for balance.                   Ended session with pt seated in PWC with all needs within reach.   Therapy Documentation Precautions:  Precautions Precautions: Fall Precaution/Restrictions Comments: falls, L hemi, mild L sided inattention with L  lateral lean Restrictions Weight Bearing Restrictions Per Provider Order: No  Pain: no pain     Therapy/Group: Individual Therapy  Mollie Anger [facility] 08/08/2023, 12:16 PM

## 2023-08-08 NOTE — Progress Notes (Signed)
 PROGRESS NOTE   Subjective/Complaints:  No issues overnite, has new power chair loner, PTA in room, ready to trial car transfers outside   ROS: Pt denies SOB, abd pain, CP, N/V/C/D,   Objective:   No results found. Recent Labs    08/08/23 0511  WBC 7.5  HGB 10.7*  HCT 33.0*  PLT 339       Recent Labs    08/08/23 0511  NA 141  K 3.7  CL 110  CO2 23  GLUCOSE 134*  BUN 17  CREATININE 1.09  CALCIUM  8.4*        Intake/Output Summary (Last 24 hours) at 08/08/2023 0807 Last data filed at 08/08/2023 0700 Gross per 24 hour  Intake 713 ml  Output 225 ml  Net 488 ml        Physical Exam: Vital Signs Blood pressure 128/63, pulse (!) 58, temperature 98.1 F (36.7 C), temperature source Oral, resp. rate 18, weight 127.7 kg, SpO2 96%.  General: No acute distress Mood and affect are appropriate Heart: Regular rate and rhythm no rubs murmurs or extra sounds Lungs: Clear to auscultation, breathing unlabored, no rales or wheezes Abdomen: Positive bowel sounds, soft nontender to palpation, nondistended Extremities: No clubbing, cyanosis, or edema  Neurologic: Cranial nerves II through XII intact, motor strength is 5/5 in right and 3-/5 left deltoid, bicep, tricep,0/5 FE and  2+ grip, 3-hip flexor, knee extensors, ankle dorsiflexor and plantar flexor. Flexor tone in left fingers/wrist.  Gait with mod assist, hyperextension at the left knee does have adequate clearance of the left foot.  No pain with ambulation Cerebellar exam remains limited by weakness on the left side  Prior neuro assessment is c/w today's exam 08/08/2023.  Musculoskeletal: Full passive  range of motion in all 4 extremities. No joint swelling   Assessment/Plan: 1. Functional deficits which require 3+ hours per day of interdisciplinary therapy in a comprehensive inpatient rehab setting. Physiatrist is providing close team supervision and 24  hour management of active medical problems listed below. Physiatrist and rehab team continue to assess barriers to discharge/monitor patient progress toward functional and medical goals  Care Tool:  Bathing    Body parts bathed by patient: Left arm, Chest, Abdomen, Right upper leg, Left upper leg, Face, Right arm, Front perineal area, Right lower leg, Left lower leg (used long sponge to reach R arm)   Body parts bathed by helper: Buttocks     Bathing assist Assist Level: Minimal Assistance - Patient > 75%     Upper Body Dressing/Undressing Upper body dressing   What is the patient wearing?: Pull over shirt    Upper body assist Assist Level: Minimal Assistance - Patient > 75%    Lower Body Dressing/Undressing Lower body dressing      What is the patient wearing?: Pants     Lower body assist Assist for lower body dressing: Moderate Assistance - Patient 50 - 74% (used reacher to don over feet)     Editor, commissioning assist Assist for toileting: Moderate Assistance - Patient 50 - 74%     Transfers Chair/bed transfer  Transfers assist     Chair/bed transfer assist  level: Minimal Assistance - Patient > 75%     Locomotion Ambulation   Ambulation assist   Ambulation activity did not occur: Safety/medical concerns (ambulated 66' with RHR in hallway mod assist and +2 WC follow)  Assist level: Moderate Assistance - Patient 50 - 74% Assistive device:  (hand rail as well as HR and under L shoulder.) Max distance: 18   Walk 10 feet activity   Assist  Walk 10 feet activity did not occur: Safety/medical concerns  Assist level: Moderate Assistance - Patient - 50 - 74% Assistive device: Walker-rolling   Walk 50 feet activity   Assist Walk 50 feet with 2 turns activity did not occur: Safety/medical concerns         Walk 150 feet activity   Assist Walk 150 feet activity did not occur: Safety/medical concerns         Walk 10 feet on uneven  surface  activity   Assist Walk 10 feet on uneven surfaces activity did not occur: Safety/medical concerns         Wheelchair     Assist Is the patient using a wheelchair?: Yes Type of Wheelchair: Manual    Wheelchair assist level: Dependent - Patient 0%      Wheelchair 50 feet with 2 turns activity    Assist        Assist Level: Dependent - Patient 0%   Wheelchair 150 feet activity     Assist      Assist Level: Dependent - Patient 0%   Blood pressure 128/63, pulse (!) 58, temperature 98.1 F (36.7 C), temperature source Oral, resp. rate 18, weight 127.7 kg, SpO2 96%.  Medical Problem List and Plan: 1. 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  Antithrombotics: -DVT/anticoagulation:  Pharmaceutical: Lovenox              -antiplatelet therapy: ASA 3. Pain Management: Tylenol  prn.  4. Mood/Behavior/Sleep: LCSW to follow for evaluation and support.              -antipsychotic agents: N/A 5. Neuropsych/cognition: This patient is capable of making decisions on his own behalf. 6. Skin/Wound Care: LCSW to follow for evaluation and support             --Gerhardt's butt cream to MASD on buttocks             --Diflucan for candida cruris.              --pressure relief measures to left elbow/left heel.  7. Fluids/Electrolytes/Nutrition:  encourage appropriate PO    Latest Ref Rng & Units 08/08/2023    5:11 AM 08/05/2023    5:33 AM 08/01/2023    6:36 AM  BMP  Glucose 70 - 99 mg/dL 540  981  191   BUN 8 - 23 mg/dL 17  17  16    Creatinine 0.61 - 1.24 mg/dL 4.78  2.95  6.21   Sodium 135 - 145 mmol/L 141  139  140   Potassium 3.5 - 5.1 mmol/L 3.7  3.8  3.6   Chloride 98 - 111 mmol/L 110  106  107  CO2 22 - 32 mmol/L 23  23  24    Calcium  8.9  - 10.3 mg/dL 8.4  8.4  8.8      1/61 Cr sl elevated--might be near baseline 8. T2DM: Hgb A1C- 8.2 (PTA) Was taking Tresiba 54 units in am ( now on Semglee  22U ) and  Metformin  1000 mg--bid (restart Metformin  at 500mg  BID)   CBG (last 3)  Recent Labs    08/07/23 1636 08/07/23 2056 08/08/23 0639  GLUCAP 177* 158* 142*   Metformin  500mg  BID  novalog to 6U TID Am CBG on low side will reduce semglee  to 28U Controlled 5/15 9.   HTN: Monitor BP TID--losartan  50 mg daily--was held for a few days to prevent hypoperfusion             -- parameters to hold prn sitting SBP< 120.   5/10- 5/11- BP controlled- con't regimen Vitals:   08/07/23 1957 08/08/23 0641  BP: 122/68 128/63  Pulse: 81 (!) 58  Resp: 18 18  Temp: 98.9 F (37.2 C) 98.1 F (36.7 C)  SpO2: 97% 96%    10.  Myasthenia Gravis Mycophenolate  1500/1250mg  --Dr. Franklin Ito at [facility].              Prednisone  5 mg PTA--->has been weaned down to 2 mg daily, monitor for Ptosis and generalized proximal weakness Asymptomatic for MG 5/6- would not use Botulinum toxin unless cleared by his neurologist, phenol motor point injection a possibility  11. Hypothyroid: Stable on levothyroxine  for supplement.  12.  Dyslipidemia: Was on pravastatin 80 mg daily 13. H/o Kidney stones: On Sodium bicarb BID to prevent stones.  14. Constipation: On miralax  daily             --sorbitol  added.   -last bm 5/1--> give sorbitol  today 5/4  5/10- LBM 5/8-   5/11- LBM 3 days ago- will order Sorbitol  this afternoon if doesn't go 15. H/o GERD: Has been off PPI.  16.  Mild anemia check stool guaic on ASA and Lovenox     Latest Ref Rng & Units 08/08/2023    5:11 AM 08/05/2023    5:33 AM 08/01/2023    6:36 AM  CBC  WBC 4.0 - 10.5 K/uL 7.5  6.1  7.1   Hemoglobin 13.0 - 17.0 g/dL 09.6  04.5  40.9   Hematocrit 39.0 - 52.0 % 33.0  35.2  35.4   Platelets 150 - 400 K/uL 339  365  407   Stable 5/15  LOS: 20 days A FACE TO FACE EVALUATION WAS PERFORMED  Cecilia Coe  Asuka Dusseau 08/08/2023, 8:07 AM

## 2023-08-09 DIAGNOSIS — D62 Acute posthemorrhagic anemia: Secondary | ICD-10-CM

## 2023-08-09 LAB — GLUCOSE, CAPILLARY
Glucose-Capillary: 124 mg/dL — ABNORMAL HIGH (ref 70–99)
Glucose-Capillary: 106 mg/dL — ABNORMAL HIGH (ref 70–99)
Glucose-Capillary: 126 mg/dL — ABNORMAL HIGH (ref 70–99)
Glucose-Capillary: 125 mg/dL — ABNORMAL HIGH (ref 70–99)

## 2023-08-09 NOTE — Progress Notes (Addendum)
 PROGRESS NOTE   Subjective/Complaints:  Pt without new complaints. Working with SLP this morning. Left knee cage is helping knee control  ROS: Patient denies fever, rash, sore throat, blurred vision, dizziness, nausea, vomiting, diarrhea, cough, shortness of breath or chest pain, joint or back/neck pain, headache, or mood change.   Objective:   No results found. Recent Labs    08/08/23 0511  WBC 7.5  HGB 10.7*  HCT 33.0*  PLT 339       Recent Labs    08/08/23 0511  NA 141  K 3.7  CL 110  CO2 23  GLUCOSE 134*  BUN 17  CREATININE 1.09  CALCIUM  8.4*        Intake/Output Summary (Last 24 hours) at 08/09/2023 1003 Last data filed at 08/09/2023 0730 Gross per 24 hour  Intake 720 ml  Output 175 ml  Net 545 ml        Physical Exam: Vital Signs Blood pressure 137/72, pulse 72, temperature 98.1 F (36.7 C), resp. rate 18, weight 127.7 kg, SpO2 97%.  Constitutional: No distress . Vital signs reviewed. HEENT: NCAT, EOMI, oral membranes moist Neck: supple Cardiovascular: RRR without murmur. No JVD    Respiratory/Chest: CTA Bilaterally without wheezes or rales. Normal effort    GI/Abdomen: BS +, non-tender, non-distended Ext: no clubbing, cyanosis, or edema Psych: pleasant and cooperative  Skin: abrasions and bruises resolving along LUE Neurologic: Cranial nerves II through XII intact, motor strength is 5/5 in right and 3-/5 left deltoid, bicep, tricep, WE tr,  0/5 FE and  2+ grip, 3-hip flexor, knee extensors, ankle dorsiflexor and plantar flexor. Flexor tone in left fingers/wrist.  Gait with mod assist, hyperextension at the left knee does have adequate clearance of the left foot.  No pain with ambulation Cerebellar exam remains limited by weakness on the left side  Prior neuro assessment is c/w today's exam 08/09/2023.  Musculoskeletal: Full passive  range of motion in all 4 extremities. No joint  swelling  -knee cage fitting appropriately LLE  Assessment/Plan: 1. Functional deficits which require 3+ hours per day of interdisciplinary therapy in a comprehensive inpatient rehab setting. Physiatrist is providing close team supervision and 24 hour management of active medical problems listed below. Physiatrist and rehab team continue to assess barriers to discharge/monitor patient progress toward functional and medical goals  Care Tool:  Bathing    Body parts bathed by patient: Left arm, Chest, Abdomen, Right upper leg, Left upper leg, Face, Right arm, Front perineal area, Right lower leg, Left lower leg (used long sponge to reach R arm)   Body parts bathed by helper: Buttocks     Bathing assist Assist Level: Minimal Assistance - Patient > 75%     Upper Body Dressing/Undressing Upper body dressing   What is the patient wearing?: Pull over shirt    Upper body assist Assist Level: Minimal Assistance - Patient > 75%    Lower Body Dressing/Undressing Lower body dressing      What is the patient wearing?: Pants     Lower body assist Assist for lower body dressing: Moderate Assistance - Patient 50 - 74% (used reacher to don over feet)  Toileting Toileting    Toileting assist Assist for toileting: Moderate Assistance - Patient 50 - 74%     Transfers Chair/bed transfer  Transfers assist     Chair/bed transfer assist level: Minimal Assistance - Patient > 75%     Locomotion Ambulation   Ambulation assist   Ambulation activity did not occur: Safety/medical concerns (ambulated 15' with RHR in hallway mod assist and +2 WC follow)  Assist level: Moderate Assistance - Patient 50 - 74% Assistive device:  (hand rail as well as HR and under L shoulder.) Max distance: 18   Walk 10 feet activity   Assist  Walk 10 feet activity did not occur: Safety/medical concerns  Assist level: Moderate Assistance - Patient - 50 - 74% Assistive device: Walker-rolling    Walk 50 feet activity   Assist Walk 50 feet with 2 turns activity did not occur: Safety/medical concerns         Walk 150 feet activity   Assist Walk 150 feet activity did not occur: Safety/medical concerns         Walk 10 feet on uneven surface  activity   Assist Walk 10 feet on uneven surfaces activity did not occur: Safety/medical concerns         Wheelchair     Assist Is the patient using a wheelchair?: Yes Type of Wheelchair: Manual    Wheelchair assist level: Dependent - Patient 0%      Wheelchair 50 feet with 2 turns activity    Assist        Assist Level: Dependent - Patient 0%   Wheelchair 150 feet activity     Assist      Assist Level: Dependent - Patient 0%   Blood pressure 137/72, pulse 72, temperature 98.1 F (36.7 C), resp. rate 18, weight 127.7 kg, SpO2 97%.  Medical Problem List and Plan: 1. 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  Antithrombotics: -DVT/anticoagulation:  Pharmaceutical: Lovenox              -antiplatelet therapy: ASA 3. Pain Management: Tylenol  prn.  4. Mood/Behavior/Sleep: LCSW to follow for evaluation and support.              -antipsychotic agents: N/A 5. Neuropsych/cognition: This patient is capable of making decisions on his own behalf. 6. Skin/Wound Care:               --Gerhardt's butt cream to MASD on buttocks             --Diflucan for candida cruris.              --continue pressure relief measures to left elbow/left heel.   -abrasions healing along LUE 7. Fluids/Electrolytes/Nutrition:  encourage appropriate PO    Latest Ref Rng & Units 08/08/2023    5:11 AM 08/05/2023    5:33 AM 08/01/2023    6:36 AM  BMP  Glucose 70 - 99 mg/dL 308  657  846   BUN 8 - 23 mg/dL 17   17  16    Creatinine 0.61 - 1.24 mg/dL 9.62  9.52  8.41   Sodium 135 - 145 mmol/L  141  139  140   Potassium 3.5 - 5.1 mmol/L 3.7  3.8  3.6   Chloride 98 - 111 mmol/L 110  106  107   CO2 22 - 32 mmol/L 23  23  24    Calcium  8.9 - 10.3 mg/dL 8.4  8.4  8.8      4/09 recent labs in range 8. T2DM: Hgb A1C- 8.2 (PTA) Was taking Tresiba 54 units in am ( now on Semglee  22U ) and  Metformin  1000 mg--bid (restart Metformin  at 500mg  BID)   CBG (last 3)  Recent Labs    08/08/23 1627 08/08/23 2112 08/09/23 0642  GLUCAP 164* 121* 124*   Metformin  500mg  BID  novalog to 6U TID Am CBG on low side will reduce semglee  to 28U 5/16--reasonable control 9.   HTN: Monitor BP TID--losartan  50 mg daily--was held for a few days to prevent hypoperfusion             -- parameters to hold prn sitting SBP< 120.   5/16- BP controlled- con't regimen Vitals:   08/08/23 2115 08/09/23 0555  BP: 115/69 137/72  Pulse: 68 72  Resp: 17 18  Temp: 98.1 F (36.7 C) 98.1 F (36.7 C)  SpO2: 99% 97%    10.  Myasthenia Gravis Mycophenolate  1500/1250mg  --Dr. Franklin Ito at [facility].              Prednisone  5 mg PTA--->has been weaned down to 2 mg daily, monitor for Ptosis and generalized proximal weakness Asymptomatic for MG 5/16- would not use Botulinum toxin unless cleared by his neurologist, phenol motor point injection a possibility  11. Hypothyroid: Stable on levothyroxine  for supplement.  12.  Dyslipidemia: Was on pravastatin 80 mg daily 13. H/o Kidney stones: On Sodium bicarb BID to prevent stones.  14. Constipation: On miralax  daily             --sorbitol  added.   -last bm 5/1--> give sorbitol  today 5/4  5/10- LBM 5/8-   5/11- LBM 3 days ago- will order Sorbitol  this afternoon if doesn't go 15. H/o GERD: Has been off PPI.  16.  Mild anemia check stool guaic on ASA and Lovenox     Latest Ref Rng & Units 08/08/2023    5:11 AM 08/05/2023    5:33 AM 08/01/2023    6:36 AM  CBC  WBC 4.0 - 10.5 K/uL 7.5  6.1  7.1    Hemoglobin 13.0 - 17.0 g/dL 81.1  91.4  78.2   Hematocrit 39.0 - 52.0 % 33.0  35.2  35.4   Platelets 150 - 400 K/uL 339  365  407   5/16 slow decrease in hgb over last 2 weeks.   -check stool for OB  -f/u labs Monday   LOS: 21 days A FACE TO FACE EVALUATION WAS PERFORMED  Rawland Caddy 08/09/2023, 10:03 AM

## 2023-08-09 NOTE — Plan of Care (Signed)
 Pt has not progressed as expected and the following goals have been deleted or downgraded to reflect actual progress:  Problem: RH Ambulation Goal: LTG Patient will ambulate in controlled environment (PT) Description: LTG: Patient will ambulate in a controlled environment, # of feet with assistance (PT). Outcome: Not Applicable   Problem: Sit to Stand Goal: LTG:  Patient will perform sit to stand with assistance level (PT) Description: LTG:  Patient will perform sit to stand with assistance level (PT) Flowsheets (Taken 08/09/2023 0601) LTG: PT will perform sit to stand in preparation for functional mobility with assistance level: Minimal Assistance - Patient > 75%   Problem: RH Bed Mobility Goal: LTG Patient will perform bed mobility with assist (PT) Description: LTG: Patient will perform bed mobility with assistance, with/without cues (PT). Flowsheets (Taken 08/09/2023 0601) LTG: Pt will perform bed mobility with assistance level of: Minimal Assistance - Patient > 75%   Problem: RH Bed to Chair Transfers Goal: LTG Patient will perform bed/chair transfers w/assist (PT) Description: LTG: Patient will perform bed to chair transfers with assistance (PT). Flowsheets (Taken 08/09/2023 0601) LTG: Pt will perform Bed to Chair Transfers with assistance level: Minimal Assistance - Patient > 75%

## 2023-08-09 NOTE — Progress Notes (Signed)
 Patient ID: [name]., male   DOB: 1950/05/15, 73 y.o.   MRN: [id number]  SW met with pt to review discharge. SW will fax outpatient PT/OT/SLP referral to [facility].   SW faxed referral.    Norval Been, MSW, LCSW Office: [phone] Cell: [phone] Fax: [phone]

## 2023-08-09 NOTE — Progress Notes (Signed)
 Speech Language Pathology Daily Session Note  Patient Details  Name: [name]. MRN: [id number] Date of Birth: Jun 14, 1950  Today's Date: 08/09/2023 SLP Individual Time: 0800-0830 SLP Individual Time Calculation (min): 30 min  Short Term Goals: Week 3: SLP Short Term Goal 1 (Week 3): STG = LTG due to ELOS  Skilled Therapeutic Interventions: Skilled therapy session focused on family education. SLP educated patient and his wife on patients current level of cognitive functioning and ST goals. SLP provided handout regarding "living a brain healthy lifestyle" and reviewed importance of cognitive stimulation and L attention during functional tasks. Patient and wife verbalized understanding with no further questions. SLP left patient in [facility] with wife present and call bell in reach. Continue POC  Pain None reported   Therapy/Group: Individual Therapy  Shalin Linders M.A., CCC-SLP 08/09/2023, 7:42 AM

## 2023-08-09 NOTE — Progress Notes (Signed)
 Occupational Therapy Weekly Progress Note  Patient Details  Name: [name]. MRN: [id number] Date of Birth: 1951-02-07  Beginning of progress report period: Aug 02, 2023 End of progress report period: Aug 09, 2023  Today's Date: 08/09/2023   Patient has met 3 of 3 short term goals.  Pt making excellent progress towards OT goals this reporting period. Pt completing stand pivot transfers with RW with MINA. Pt completes UB ADLS with MIN A and LB ADLS with MIN- MODA. Pt wanting stedy for home for ADLs at home and had already purchased even though pt progressing well with ADLs. Family ed set- up for 5/16 with wife. Pts LUE progressing well with gross assist during ADLs but pt still lacks L digit extension. Pt doing well with managing PWC but would benefit from continued practice. LB goals downgraded to met pt needs, DC set for 5/20.   Patient continues to demonstrate the following deficits: muscle weakness and muscle joint tightness, decreased cardiorespiratoy endurance, impaired timing and sequencing, unbalanced muscle activation, decreased coordination, and decreased motor planning, decreased visual perceptual skills, decreased attention, decreased awareness, decreased problem solving, decreased safety awareness, decreased memory, and delayed processing, and decreased sitting balance, decreased standing balance, decreased postural control, hemiplegia, and decreased balance strategies and therefore will continue to benefit from skilled OT intervention to enhance overall performance with BADL and Reduce care partner burden.  Patient progressing toward long term goals..  Plan of care revisions: Some LTG were downgraded from contact guard to min A.  OT Short Term Goals Week 1:  OT Short Term Goal 1 (Week 1): Pt will perform sit <> stands at LRAD with MOD A OT Short Term Goal 1 - Progress (Week 1): Met OT Short Term Goal 2 (Week 1): Pt will perform toilet transfer with LRAD and MOD of 1 OT  Short Term Goal 2 - Progress (Week 1): Progressing toward goal OT Short Term Goal 3 (Week 1): Pt will improve MMT/ROM of LUE to 2/5 OT Short Term Goal 3 - Progress (Week 1): Met OT Short Term Goal 4 (Week 1): Pt will utilize hemitechniques for dressing consistently with MIN cues OT Short Term Goal 4 - Progress (Week 1): Progressing toward goal Week 2:  OT Short Term Goal 1 (Week 2): Pt will complete toilet transfers with mod A or less using squat or stand pivot. OT Short Term Goal 1 - Progress (Week 2): Met OT Short Term Goal 2 (Week 2): Pt will don shirt with min A using hemi dressing techniques using L ARM as an active assist. OT Short Term Goal 2 - Progress (Week 2): Met OT Short Term Goal 3 (Week 2): Pt will be able to hold L arm above shoulder height for 10 + seconds to be able to wash under arm. OT Short Term Goal 3 - Progress (Week 2): Met OT Short Term Goal 4 (Week 2): Pt will stand and pull pants over hips with 50% or less assist. OT Short Term Goal 4 - Progress (Week 2): Met Week 3:  OT Short Term Goal 1 (Week 3): Pt will don a shirt with supervision. OT Short Term Goal 1 - Progress (Week 3): Met OT Short Term Goal 2 (Week 3): Pt will be able to pull pants over hips with 20% help or less. OT Short Term Goal 2 - Progress (Week 3): Met OT Short Term Goal 3 (Week 3): Pt will complete stand pivot transfers to toilet with RW with min A. OT Short  Term Goal 3 - Progress (Week 3): Met Week 4:  OT Short Term Goal 1 (Week 4): STG=LTGs    Therapy Documentation Precautions:  Precautions Precautions: Fall Precaution/Restrictions Comments: falls, L hemi, mild L sided inattention with L lateral lean Restrictions Weight Bearing Restrictions Per Provider Order: No    Therapy/Group: Individual Therapy  Mollie Anger [facility] 08/09/2023, 7:31 AM

## 2023-08-09 NOTE — Plan of Care (Signed)
  Problem: RH Dressing Goal: LTG Patient will perform lower body dressing w/assist (OT) Description: LTG: Patient will perform lower body dressing with assist, with/without cues in positioning using equipment (OT) Flowsheets (Taken 08/09/2023 1608) LTG: Pt will perform lower body dressing with assistance level of: (downgraded JLS) Moderate Assistance - Patient 50 - 74% Note: downgraded JLS    Problem: RH Toileting Goal: LTG Patient will perform toileting task (3/3 steps) with assistance level (OT) Description: LTG: Patient will perform toileting task (3/3 steps) with assistance level (OT)  Flowsheets (Taken 08/09/2023 1608) LTG: Pt will perform toileting task (3/3 steps) with assistance level: (downgraded JLS) Moderate Assistance - Patient 50 - 74% Note: downgraded JLS    Problem: RH Toilet Transfers Goal: LTG Patient will perform toilet transfers w/assist (OT) Description: LTG: Patient will perform toilet transfers with assist, with/without cues using equipment (OT) Flowsheets (Taken 08/09/2023 1608) LTG: Pt will perform toilet transfers with assistance level of: (downgraded JLS) Minimal Assistance - Patient > 75% Note: downgraded JLS    Problem: RH Tub/Shower Transfers Goal: LTG Patient will perform tub/shower transfers w/assist (OT) Description: LTG: Patient will perform tub/shower transfers with assist, with/without cues using equipment (OT) Flowsheets (Taken 08/09/2023 1608) LTG: Pt will perform tub/shower stall transfers with assistance level of: (downgraded JLS) Minimal Assistance - Patient > 75% Note: downgraded JLS

## 2023-08-09 NOTE — Progress Notes (Addendum)
 Physical Therapy Session Note  Patient Details  Name: [name]. MRN: [id number] Date of Birth: Jan 11, 1951  Today's Date: 08/09/2023 PT Individual Time: 1010-1108; 1305 - 1404 PT Individual Time Calculation (min): 58 min; 59 min   Short Term Goals: Week 3:  PT Short Term Goal 1 (Week 3): STG = LTG d/t ELOS  SESSION 1 Skilled Therapeutic Interventions/Progress Updates: Patient sitting in PWC on entrance to room. Patient alert and agreeable to PT session.   Patient reported no pain during session. Session focused on family education with pt's wife. Pt and pt wife with questions regarding ambulation at home down/up ramp due to ramp having railing. PTA highly discouraged this due to safety. Education provided as to why per pt's tendency to anteriorly shift weight while ambulating, and how ascending will increase L hyperextension, even with swedish knee cage, and that it can be a goal with OPPT. PTA encouraged pt to self advocate for self when in OPPT with personal goals. Pt navigated hallway/gym/room spaces with PWC and supervision with VC to increase L sided attention to avoid hitting L UE on something. Pt also required increased time to navigate inpatient floor due to decreased speed setting as pt feels more safe on slower speed for the time being. Pt participated in multiple transfers from PWC<>edge of mat with PTA providing supervision, and wife providing CGA/minA. PTA providing education and VC for sequence/set up/weight shift. PTA also encouraged pt and wife to problem solve when pt unable to stand after few attempts, and to assess set up/mechanics (pt and wife did so while PTA supervised). Pt and pt wife felt a little better about transfers with RW. PTA and pt will continue to practice transfers without PTA intervening to allow pt to obtain the problem solving skills needed at home. Pt also with L swedish knee cage donned throughout (educated to have on when standing to avoid  hyperextension). Pt ascended/descended ramp in ortho gym with supervision/minA for safety (specifically when descending due to quick movements almost hitting railing). Pt also educated to keep seat slightly reclined when descending to avoid anterior weight shift when driving off of ramp. PTA stated to pt and pt wife that pt will continue to practice ramps heavily prior to d/c as well.   Patient in PWC at end of session with brakes locked, wife present, and all needs within reach.  SESSION 2 Skilled Therapeutic Interventions/Progress Updates: Patient sitting in PWC on entrance to room. Patient alert and agreeable to PT session.   Patient reported no pain. Session focused on [facility] navigation on ramps and community spaces/distances. Pt maneuvered PWC with supervision throughout session with continued cuing to increase L sided awareness as pt had moments of almost bumping into objects on L (improved from this morning), or going off of the sidewalk onto mulch when outside on non-compliant surfaces (ascending/descending sidewalk). Pt ascended/descended ramp in ortho gym with supervision and VC for safety (to tilt chair posteriorly when descending and to ensure belt donned, as well as ensuring slowest setting is on, and to "commit" to navigating over lip of ramp to avoid anterior jolt from brakes engaging on PWC). Pt performed 2 rounds and also cued to move PWC 360* in both directions at top of landing with cues to increase awareness to wheels on back and feet in front when turning. Pt cued to increase speed in hallway to cover more ground (safely) at appropriate times (indoor vs outdoor settings - 1.1,1.2 etc., and 2.1, 2.2 etc). Pt demonstrated  180* turns in elevator x 2, even with people inside without incident. Overall pt required supervision throughout for safety, and would benefit from continued interventions with maneuvering PWC in the coming days prior tod d/c.   Patient in PWC at end of session with brakes  locked, and all needs within reach.        Therapy Documentation Precautions:  Precautions Precautions: Fall Precaution/Restrictions Comments: falls, L hemi, mild L sided inattention with L lateral lean Restrictions Weight Bearing Restrictions Per Provider Order: No  Therapy/Group: Individual Therapy  Adriannah Steinkamp PTA 08/09/2023, 12:44 PM

## 2023-08-09 NOTE — Progress Notes (Addendum)
 Occupational Therapy Session Note  Patient Details  Name: [name]. MRN: [id number] Date of Birth: Jan 19, 1951  Today's Date: 08/09/2023 OT Individual Time: 7829-5621 OT Individual Time Calculation (min): 61 min    Short Term Goals: Week 3:  OT Short Term Goal 1 (Week 3): Pt will don a shirt with supervision. OT Short Term Goal 1 - Progress (Week 3): Met OT Short Term Goal 2 (Week 3): Pt will be able to pull pants over hips with 20% help or less. OT Short Term Goal 2 - Progress (Week 3): Met OT Short Term Goal 3 (Week 3): Pt will complete stand pivot transfers to toilet with RW with min A. OT Short Term Goal 3 - Progress (Week 3): Met  Skilled Therapeutic Interventions/Progress Updates:  Pt greeted seated in PWC, pt agreeable to OT intervention.      family education provided to pts wife [name] on the below topics: -always using gait belt for functional mobility -recommendation on use of RW for stand pivot transfers to his lift chair - general assist needed for ADLS and functional mobility ( I.e MINA- supervision ) -recommendation to try to not sit for longer than 1 hour at a time -general education provided on L inattention and recommendation of lap tray for PWC on L side, wife to order one from [employer].  -decreasing clutter/fall hazards in the home -education provided on shower transfers to walkin shower with recommendation of completing stand pivot to shower chair with use of grab bar.  -education provided on standing on pts L side during transfers -recommended pt use stedy in bathroom if environment ends up being too small for PWC -recommended use of stedy for LB ADLs to decrease caregiver burden -issued pt bathmit to assist with bathing using LUE as gross assist -recommended using LUE as often as possible during ADLs at home and making sure to stretch LUE to decrease tone/tightness.   Family demonstrated appropriate level of assist with ADLs and functional mobility  with wife practicing stand pivot transfer to [facility] over toilet with use of grab bar with MIN A and stand pivot transfers with Rw to EOM.                  Ended session with pt seated in PWC with all needs within reach and wife present.         Therapy Documentation Precautions:  Precautions Precautions: Fall Precaution/Restrictions Comments: falls, L hemi, mild L sided inattention with L lateral lean Restrictions Weight Bearing Restrictions Per Provider Order: No  Pain: No pain    Therapy/Group: Individual Therapy  Mollie Anger [facility] 08/09/2023, 11:22 AM

## 2023-08-10 DIAGNOSIS — R600 Localized edema: Secondary | ICD-10-CM

## 2023-08-10 DIAGNOSIS — K5900 Constipation, unspecified: Secondary | ICD-10-CM

## 2023-08-10 LAB — GLUCOSE, CAPILLARY
Glucose-Capillary: 102 mg/dL — ABNORMAL HIGH (ref 70–99)
Glucose-Capillary: 118 mg/dL — ABNORMAL HIGH (ref 70–99)
Glucose-Capillary: 152 mg/dL — ABNORMAL HIGH (ref 70–99)
Glucose-Capillary: 147 mg/dL — ABNORMAL HIGH (ref 70–99)

## 2023-08-10 LAB — OCCULT BLOOD X 1 CARD TO LAB, STOOL: Fecal Occult Bld: NEGATIVE

## 2023-08-10 NOTE — Plan of Care (Signed)
  Problem: Consults Goal: RH STROKE PATIENT EDUCATION Description: See Patient Education module for education specifics  Outcome: Progressing   Problem: RH BOWEL ELIMINATION Goal: RH STG MANAGE BOWEL WITH ASSISTANCE Description: STG Manage Bowel with mod I  Assistance. Outcome: Progressing Goal: RH STG MANAGE BOWEL W/MEDICATION W/ASSISTANCE Description: STG Manage Bowel with Medication with mod I Assistance. Outcome: Progressing   Problem: RH SAFETY Goal: RH STG ADHERE TO SAFETY PRECAUTIONS W/ASSISTANCE/DEVICE Description: STG Adhere to Safety Precautions With cues Assistance/Device. Outcome: Progressing   Problem: RH PAIN MANAGEMENT Goal: RH STG PAIN MANAGED AT OR BELOW PT'S PAIN GOAL Description: < 4 with prns Outcome: Progressing   Problem: RH KNOWLEDGE DEFICIT Goal: RH STG INCREASE KNOWLEDGE OF DIABETES Description: Patient and spouse will be able to manage DM using educational resources for medications and dietary modification independently Outcome: Progressing Goal: RH STG INCREASE KNOWLEDGE OF HYPERTENSION Description: Patient and spouse will be able to manage HTN using educational resources for medications and dietary modification independently Outcome: Progressing Goal: RH STG INCREASE KNOWLEGDE OF HYPERLIPIDEMIA Description: Patient and spouse will be able to manage HLD using educational resources for medications and dietary modification independently Outcome: Progressing Goal: RH STG INCREASE KNOWLEDGE OF STROKE PROPHYLAXIS Description: Patient and spouse will be able to manage secondary risks using educational resources for medications and dietary modification independently Outcome: Progressing   Problem: Education: Goal: Ability to describe self-care measures that may prevent or decrease complications (Diabetes Survival Skills Education) will improve Outcome: Progressing Goal: Individualized Educational Video(s) Outcome: Progressing   Problem: Coping: Goal:  Ability to adjust to condition or change in health will improve Outcome: Progressing   Problem: Fluid Volume: Goal: Ability to maintain a balanced intake and output will improve Outcome: Progressing   Problem: Health Behavior/Discharge Planning: Goal: Ability to identify and utilize available resources and services will improve Outcome: Progressing Goal: Ability to manage health-related needs will improve Outcome: Progressing   Problem: Metabolic: Goal: Ability to maintain appropriate glucose levels will improve Outcome: Progressing   Problem: Nutritional: Goal: Maintenance of adequate nutrition will improve Outcome: Progressing Goal: Progress toward achieving an optimal weight will improve Outcome: Progressing   Problem: Skin Integrity: Goal: Risk for impaired skin integrity will decrease Outcome: Progressing   Problem: Tissue Perfusion: Goal: Adequacy of tissue perfusion will improve Outcome: Progressing

## 2023-08-10 NOTE — Progress Notes (Signed)
 Physical Therapy Session Note  Patient Details  Name: [name]. MRN: [id number] Date of Birth: 07-27-1950  Today's Date: 08/10/2023 PT Individual Time: 0904-1002 PT Individual Time Calculation (min): 58 min   Short Term Goals: Week 3:  PT Short Term Goal 1 (Week 3): STG = LTG d/t ELOS  Skilled Therapeutic Interventions/Progress Updates: Patient sitting in PWC on entrance to room. Patient alert and agreeable to PT session.   Pt reported no pain during session. Pt had questions regarding PWC loaner, and the one that will be supplied that is custom built. Pt stated his family found another one online that could be easily transported without having to purchase a lift attachment to current PWC. PTA stated that it could not be determined if this PWC that family found is appropriate. Pt wanted to talk to [employer] rep that evaluated him earlier in week for PWC to ask about return policy/grace period (if wanting to go down the route of purchasing the one family found out of pocket), and overall questions regarding the matter. PTA reached out to [employer] rep to follow up with pt (pt requested to have rep, if able, to meet with pt and pt family prior to leaving). PTA further stated that custom PWC can be adjusted if pt does not like fit after delivery. Pt navigated PWC from room<>day room gym with supervision and pt showing increase in L sided attention. Pt performed sit<>stand transfers with RW and with minA to stabilize RW. Pt self assessed errors when standing, and has improved in ability to problem solve if set up mechanics are not correct for pt to stand safely. Pt performed dynamic standing balance in RW with CGA while reaching with R UE in various directions with no incident. Pt performed sit<>supine on mat for aspects of MMT with modA and cues for sequence.   Patient sitting in PWC at end of session with brakes locked, and all needs within reach.      Therapy Documentation Precautions:   Precautions Precautions: Fall Precaution/Restrictions Comments: falls, L hemi, mild L sided inattention with L lateral lean Restrictions Weight Bearing Restrictions Per Provider Order: No  Therapy/Group: Individual Therapy  Lunden Mcleish PTA 08/10/2023, 12:42 PM

## 2023-08-10 NOTE — Progress Notes (Signed)
 Pt edema went from +1 bilat to +2 right +3 left overnight, new spot on back of left calf weeping large amounts, cleansed and applied foam,  charge nurse and MD made aware of the change

## 2023-08-10 NOTE — Progress Notes (Signed)
 PROGRESS NOTE   Subjective/Complaints:  Patient reports he is comfortable, no new concerns or complaints.  Nursing noted his edema is a little worse in his legs, some weeping from his left leg abrasions.  Patient reports his left leg edema is usually little bit worse than the right.  He says he did not elevate his legs yesterday.  ROS: Patient denies fever, new vision changes, dizziness, nausea, vomiting, diarrhea,  shortness of breath or chest pain, headache, or mood change.    Objective:   No results found. Recent Labs    08/08/23 0511  WBC 7.5  HGB 10.7*  HCT 33.0*  PLT 339       Recent Labs    08/08/23 0511  NA 141  K 3.7  CL 110  CO2 23  GLUCOSE 134*  BUN 17  CREATININE 1.09  CALCIUM  8.4*        Intake/Output Summary (Last 24 hours) at 08/10/2023 1330 Last data filed at 08/10/2023 1227 Gross per 24 hour  Intake 640 ml  Output 400 ml  Net 240 ml        Physical Exam: Vital Signs Blood pressure 109/62, pulse 63, temperature 98.1 F (36.7 C), temperature source Oral, resp. rate 18, weight 127.7 kg, SpO2 98%.  Constitutional: No distress . Vital signs reviewed. HEENT: NCAT, EOMI, oral membranes moist Neck: supple Cardiovascular: RRR without murmur. No JVD    Respiratory/Chest: CTA Bilaterally without wheezes or rales. Normal effort    GI/Abdomen: BS +, non-tender, non-distended Ext: 2+ left, 1+ right lower extremity edema. Psych: pleasant and cooperative  Skin: abrasions and bruises covered with dry Mepilex dressings Neurologic: Cranial nerves II through XII intact, motor strength is 5/5 in right and 3-/5 left deltoid, bicep, tricep, WE tr,  0/5 FE and  2+ grip, 3-hip flexor, knee extensors, ankle dorsiflexor and plantar flexor. Flexor tone in left fingers/wrist.  Gait with mod assist, hyperextension at the left knee does have adequate clearance of the left foot.  No pain with  ambulation Cerebellar exam remains limited by weakness on the left side  Prior neuro assessment is c/w today's exam 08/10/2023.  Musculoskeletal: Full passive  range of motion in all 4 extremities. No joint swelling  -knee cage fitting appropriately LLE- not wearing this currently  Assessment/Plan: 1. Functional deficits which require 3+ hours per day of interdisciplinary therapy in a comprehensive inpatient rehab setting. Physiatrist is providing close team supervision and 24 hour management of active medical problems listed below. Physiatrist and rehab team continue to assess barriers to discharge/monitor patient progress toward functional and medical goals  Care Tool:  Bathing    Body parts bathed by patient: Left arm, Chest, Abdomen, Right upper leg, Left upper leg, Face, Right arm, Front perineal area, Right lower leg, Left lower leg (used long sponge to reach R arm)   Body parts bathed by helper: Buttocks     Bathing assist Assist Level: Minimal Assistance - Patient > 75%     Upper Body Dressing/Undressing Upper body dressing   What is the patient wearing?: Pull over shirt    Upper body assist Assist Level: Minimal Assistance - Patient > 75%  Lower Body Dressing/Undressing Lower body dressing      What is the patient wearing?: Pants     Lower body assist Assist for lower body dressing: Moderate Assistance - Patient 50 - 74% (used reacher to don over feet)     Editor, commissioning assist Assist for toileting: Moderate Assistance - Patient 50 - 74%     Transfers Chair/bed transfer  Transfers assist     Chair/bed transfer assist level: Minimal Assistance - Patient > 75%     Locomotion Ambulation   Ambulation assist   Ambulation activity did not occur: Safety/medical concerns (ambulated 61' with RHR in hallway mod assist and +2 WC follow)  Assist level: Moderate Assistance - Patient 50 - 74% Assistive device:  (hand rail as well as HR and  under L shoulder.) Max distance: 18   Walk 10 feet activity   Assist  Walk 10 feet activity did not occur: Safety/medical concerns  Assist level: Moderate Assistance - Patient - 50 - 74% Assistive device: Walker-rolling   Walk 50 feet activity   Assist Walk 50 feet with 2 turns activity did not occur: Safety/medical concerns         Walk 150 feet activity   Assist Walk 150 feet activity did not occur: Safety/medical concerns         Walk 10 feet on uneven surface  activity   Assist Walk 10 feet on uneven surfaces activity did not occur: Safety/medical concerns         Wheelchair     Assist Is the patient using a wheelchair?: Yes Type of Wheelchair: Manual    Wheelchair assist level: Dependent - Patient 0%      Wheelchair 50 feet with 2 turns activity    Assist        Assist Level: Dependent - Patient 0%   Wheelchair 150 feet activity     Assist      Assist Level: Dependent - Patient 0%   Blood pressure 109/62, pulse 63, temperature 98.1 F (36.7 C), temperature source Oral, resp. rate 18, weight 127.7 kg, SpO2 98%.  Medical Problem List and Plan: 1. 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  Antithrombotics: -DVT/anticoagulation:  Pharmaceutical: Lovenox              -antiplatelet therapy: ASA 3. Pain Management: Tylenol  prn.  4. Mood/Behavior/Sleep: LCSW to follow for evaluation and support.              -antipsychotic agents: N/A 5. Neuropsych/cognition: This patient is capable of making decisions on his own behalf. 6. Skin/Wound Care:               --Gerhardt's butt cream to MASD on buttocks             --Diflucan for candida cruris.              --continue pressure relief measures to left elbow/left  heel.   -abrasions healing along LUE 7. Fluids/Electrolytes/Nutrition:  encourage appropriate PO    Latest Ref Rng & Units 08/08/2023    5:11 AM 08/05/2023    5:33 AM 08/01/2023  6:36 AM  BMP  Glucose 70 - 99 mg/dL 161  096  045   BUN 8 - 23 mg/dL 17  17  16    Creatinine 0.61 - 1.24 mg/dL 4.09  8.11  9.14   Sodium 135 - 145 mmol/L 141  139  140   Potassium 3.5 - 5.1 mmol/L 3.7  3.8  3.6   Chloride 98 - 111 mmol/L 110  106  107   CO2 22 - 32 mmol/L 23  23  24    Calcium  8.9 - 10.3 mg/dL 8.4  8.4  8.8      7/82 recent labs in range 8. T2DM: Hgb A1C- 8.2 (PTA) Was taking Tresiba 54 units in am ( now on Semglee  22U ) and  Metformin  1000 mg--bid (restart Metformin  at 500mg  BID)   CBG (last 3)  Recent Labs    08/09/23 2100 08/10/23 0607 08/10/23 1231  GLUCAP 125* 102* 118*   Metformin  500mg  BID  novalog to 6U TID Am CBG on low side will reduce semglee  to 28U 5/17 appears well-controlled, continue current regimen 9.   HTN: Monitor BP TID--losartan  50 mg daily--was held for a few days to prevent hypoperfusion             -- parameters to hold prn sitting SBP< 120.   5/17 BP well-controlled continue to monitor Vitals:   08/09/23 1939 08/10/23 0352  BP: 113/60 109/62  Pulse: 73 63  Resp: 18 18  Temp: 97.8 F (36.6 C) 98.1 F (36.7 C)  SpO2: 100% 98%    10.  Myasthenia Gravis Mycophenolate  1500/1250mg  --Dr. Franklin Ito at [facility].              Prednisone  5 mg PTA--->has been weaned down to 2 mg daily, monitor for Ptosis and generalized proximal weakness Asymptomatic for MG 5/16- would not use Botulinum toxin unless cleared by his neurologist, phenol motor point injection a possibility  5/17 denies any shortness of breath 11. Hypothyroid: Stable on levothyroxine  for supplement.  12.  Dyslipidemia: Was on pravastatin 80 mg daily 13. H/o Kidney stones: On Sodium bicarb BID to prevent stones.  14. Constipation: On miralax  daily             --sorbitol  added.   -last bm 5/1--> give sorbitol   today 5/4  5/10- LBM 5/8-   5/11- LBM 3 days ago- will order Sorbitol  this afternoon if doesn't go  LBM 5/16-continue to monitor bowel function 15. H/o GERD: Has been off PPI.  16.  Mild anemia check stool guaic on ASA and Lovenox     Latest Ref Rng & Units 08/08/2023    5:11 AM 08/05/2023    5:33 AM 08/01/2023    6:36 AM  CBC  WBC 4.0 - 10.5 K/uL 7.5  6.1  7.1   Hemoglobin 13.0 - 17.0 g/dL 95.6  21.3  08.6   Hematocrit 39.0 - 52.0 % 33.0  35.2  35.4   Platelets 150 - 400 K/uL 339  365  407   5/16 slow decrease in hgb over last 2 weeks.   -check stool for OB  -f/u labs Monday  5/17 Lower extremity edema  - Patient reports this is chronic, did not elevate legs yesterday.  He will try to elevate his legs in bed.  Can consider low-dose Lasix if does not improve.  Has some abrasions on his left leg, appear okay.  LOS: 22 days A FACE TO FACE EVALUATION WAS PERFORMED  Lylia Sand 08/10/2023, 1:30 PM

## 2023-08-10 NOTE — Progress Notes (Signed)
 Physical Therapy Discharge Summary  Patient Details  Name: [name]. MRN: [id number] Date of Birth: [birth year]  Date of Discharge from PT service:Aug 12, 2023  Patient has met 6 of 8 long term goals due to improved activity tolerance, improved balance, improved postural control, increased strength, improved attention, and improved awareness.  Patient to discharge at a wheelchair level Supervision.   Patient's care partner is independent to provide the necessary physical assistance at discharge.  Reasons goals not met: Pt continues to require supervision for driving wheelchair d/t L inattention and continued but improving field cut.   Recommendation:  Patient will benefit from ongoing skilled PT services in outpatient setting to continue to advance safe functional mobility, address ongoing impairments in strength, coordination, balance, activity tolerance, cognition, safety awareness, and to minimize fall risk.  Equipment: power wheelchair  Reasons for discharge: treatment goals met  Patient/family agrees with progress made and goals achieved: Yes  PT Discharge Precautions/Restrictions Precautions Precautions: Fall Precaution/Restrictions Comments: falls, L hemi, mild L sided inattention that has improved since evaluation Restrictions Weight Bearing Restrictions Per Provider Order: No Pain  Pain Assessment Pain Scale: 0-10 Pain Score: 0-No pain Pain Interference Pain Interference Pain Effect on Sleep: 1. Rarely or not at all Pain Interference with Therapy Activities: 1. Rarely or not at all Pain Interference with Day-to-Day Activities: 1. Rarely or not at all Vision/Perception  Vision - History Ability to See in Adequate Light: 1 Impaired Vision - Assessment Eye Alignment: Within Functional Limits Ocular Range of Motion: Within Functional Limits Alignment/Gaze Preference: Within Defined Limits Tracking/Visual Pursuits: Able to track stimulus in all quads without  difficulty Convergence: Within functional limits Perception Perception: Impaired Preception Impairment Details: Inattention/Neglect Perception-Other Comments: L inattention, improved from eval Praxis Praxis: Impaired Praxis Impairment Details: Motor planning  Cognition Overall Cognitive Status: Within Functional Limits for tasks assessed Arousal/Alertness: Awake/alert Orientation Level: Oriented X4 Attention: Sustained;Selective Sustained Attention: Appears intact Selective Attention: Appears intact Sensation Sensation Light Touch: Impaired Detail Light Touch Impaired Details: Impaired LUE;Impaired LLE Hot/Cold: Impaired Detail Hot/Cold Impaired Details: Impaired LUE;Impaired LLE Proprioception: Impaired by gross assessment Stereognosis: Not tested Coordination Gross Motor Movements are Fluid and Coordinated: No Fine Motor Movements are Fluid and Coordinated: No Coordination and Movement Description: L hemiplegia Heel Shin Test: unable with LLE; slow with RLE Motor  Motor Motor: Other (comment) (L hemiparesis) Motor - Discharge Observations: L hemi with LUE>LLE and mild inattention to L that has improved since evaluation  Mobility Bed Mobility Bed Mobility: Sit to Supine;Supine to Sit Supine to Sit: Minimal Assistance - Patient > 75% Sit to Supine: Minimal Assistance - Patient > 75% Transfers Transfers: Sit to Stand;Stand to Sit;Stand Pivot Transfers Sit to Stand: Minimal Assistance - Patient > 75% Stand to Sit: Minimal Assistance - Patient > 75% Stand Pivot Transfers: Minimal Assistance - Patient > 75% Stand Pivot Transfer Details: Verbal cues for safe use of DME/AE;Verbal cues for sequencing Transfer (Assistive device): Rolling walker Locomotion  Gait Ambulation: Yes Gait Assistance: Moderate Assistance - Patient 50-74% Gait Distance (Feet): 15 Feet Assistive device: Rolling walker Gait Assistance Details: Verbal cues for technique;Verbal cues for gait  pattern;Verbal cues for safe use of DME/AE;Manual facilitation for weight shifting Gait Gait: Yes Gait Pattern: Impaired Gait Pattern: Ataxic;Step-through pattern;Decreased stance time - right;Trunk flexed;Narrow base of support;Left genu recurvatum (swedish knee cage donned L) Gait velocity: decreased Stairs / Additional Locomotion Stairs: No Wheelchair Mobility Wheelchair Mobility: Yes Wheelchair Assistance: Doctor, general practice: Power Wheelchair Parts Management: Supervision/cueing  Distance: 500'+  Trunk/Postural Assessment  Cervical Assessment Cervical Assessment: Within Functional Limits Thoracic Assessment Thoracic Assessment: Exceptions to [facility] (rounded shoulders) Lumbar Assessment Lumbar Assessment: Exceptions to [facility] (posterior pelvic tilt) Postural Control Postural Control: Deficits on evaluation Trunk Control: improved since evaluation Righting Reactions: decreased Protective Responses: decreased  Balance Balance Balance Assessed: Yes Static Sitting Balance Static Sitting - Balance Support: Feet supported;Right upper extremity supported Static Sitting - Level of Assistance: 5: Stand by assistance Dynamic Sitting Balance Dynamic Sitting - Balance Support: Feet supported;No upper extremity supported Dynamic Sitting - Level of Assistance: 5: Stand by assistance Dynamic Sitting - Balance Activities: Lateral lean/weight shifting;Ball toss;Reaching across midline Static Standing Balance Static Standing - Balance Support: Bilateral upper extremity supported Static Standing - Level of Assistance: 5: Stand by assistance Dynamic Standing Balance Dynamic Standing - Balance Support: During functional activity;Left upper extremity supported Dynamic Standing - Level of Assistance:  (CGA) Dynamic Standing - Balance Activities: Reaching across midline;Reaching for objects Extremity Assessment  RLE Assessment RLE Assessment: Within Functional  Limits LLE Assessment LLE Assessment: Exceptions to [facility] Strength Comments: tested  in sitting LLE Strength Left Hip Flexion: 3-/5 Left Hip Extension: 3+/5 Left Knee Flexion: 3-/5 Left Knee Extension: 4-/5 Left Ankle Dorsiflexion: 3+/5 Left Ankle Plantar Flexion: 4-/5   Dominic A Sandoval PTA Donne Gage PT, DPT, CSRS 08/10/2023, 1:04 PM

## 2023-08-11 LAB — GLUCOSE, CAPILLARY
Glucose-Capillary: 128 mg/dL — ABNORMAL HIGH (ref 70–99)
Glucose-Capillary: 131 mg/dL — ABNORMAL HIGH (ref 70–99)
Glucose-Capillary: 144 mg/dL — ABNORMAL HIGH (ref 70–99)
Glucose-Capillary: 125 mg/dL — ABNORMAL HIGH (ref 70–99)

## 2023-08-11 NOTE — Progress Notes (Signed)
 PROGRESS NOTE   Subjective/Complaints:  Patient reports leg swelling improved with elevation last night, feels like it is back to his baseline.  No new concerns.  Reports last bowel movement was yesterday.  ROS: Patient denies fever, new vision changes, dizziness, nausea, vomiting, diarrhea,  shortness of breath or chest pain, headache, or mood change.    Objective:   No results found. No results for input(s): "WBC", "HGB", "HCT", "PLT" in the last 72 hours.      No results for input(s): "NA", "K", "CL", "CO2", "GLUCOSE", "BUN", "CREATININE", "CALCIUM " in the last 72 hours.       Intake/Output Summary (Last 24 hours) at 08/11/2023 1700 Last data filed at 08/11/2023 1600 Gross per 24 hour  Intake 876 ml  Output 715 ml  Net 161 ml        Physical Exam: Vital Signs Blood pressure 116/66, pulse 72, temperature 98.3 F (36.8 C), temperature source Oral, resp. rate 18, weight 127.7 kg, SpO2 99%.  Constitutional: No distress . Vital signs reviewed. HEENT: NCAT, EOMI, oral membranes moist Neck: supple Cardiovascular: RRR without murmur. No JVD    Respiratory/Chest: CTA Bilaterally without wheezes or rales. Normal effort    GI/Abdomen: BS +, non-tender, non-distended Ext: mild b/l leg edema Psych: pleasant and cooperative  Skin: abrasions and bruises covered with dry Mepilex dressings Neurologic: Cranial nerves II through XII intact, motor strength is 5/5 in right and 3-/5 left deltoid, bicep, tricep, WE tr,  0/5 FE and  2+ grip, 3-hip flexor, knee extensors, ankle dorsiflexor and plantar flexor. Flexor tone in left fingers/wrist.  Gait with mod assist, hyperextension at the left knee does have adequate clearance of the left foot.  No pain with ambulation Cerebellar exam remains limited by weakness on the left side  Prior neuro assessment is c/w today's exam 08/11/2023.  Musculoskeletal: Full passive  range of motion  in all 4 extremities. No joint swelling  -knee cage fitting appropriately LLE- not wearing this currently  Assessment/Plan: 1. Functional deficits which require 3+ hours per day of interdisciplinary therapy in a comprehensive inpatient rehab setting. Physiatrist is providing close team supervision and 24 hour management of active medical problems listed below. Physiatrist and rehab team continue to assess barriers to discharge/monitor patient progress toward functional and medical goals  Care Tool:  Bathing    Body parts bathed by patient: Left arm, Chest, Abdomen, Right upper leg, Left upper leg, Face, Right arm, Front perineal area, Right lower leg, Left lower leg (used long sponge to reach R arm)   Body parts bathed by helper: Buttocks     Bathing assist Assist Level: Minimal Assistance - Patient > 75%     Upper Body Dressing/Undressing Upper body dressing   What is the patient wearing?: Pull over shirt    Upper body assist Assist Level: Minimal Assistance - Patient > 75%    Lower Body Dressing/Undressing Lower body dressing      What is the patient wearing?: Pants     Lower body assist Assist for lower body dressing: Moderate Assistance - Patient 50 - 74% (used reacher to don over feet)     Interior and spatial designer  Toileting assist Assist for toileting: Moderate Assistance - Patient 50 - 74%     Transfers Chair/bed transfer  Transfers assist     Chair/bed transfer assist level: Minimal Assistance - Patient > 75%     Locomotion Ambulation   Ambulation assist   Ambulation activity did not occur: Safety/medical concerns (ambulated 66' with RHR in hallway mod assist and +2 WC follow)  Assist level: Moderate Assistance - Patient 50 - 74% Assistive device: Walker-rolling (L swedish knee cage) Max distance: 18   Walk 10 feet activity   Assist  Walk 10 feet activity did not occur: Safety/medical concerns  Assist level: Moderate Assistance - Patient - 50  - 74% Assistive device: Walker-rolling (L swedish knee cage)   Walk 50 feet activity   Assist Walk 50 feet with 2 turns activity did not occur: Safety/medical concerns (decreased endurance to do so safely)         Walk 150 feet activity   Assist Walk 150 feet activity did not occur: Safety/medical concerns (decreased endurance to do so safely)         Walk 10 feet on uneven surface  activity   Assist Walk 10 feet on uneven surfaces activity did not occur: Safety/medical concerns (decreased endurance to do so safely)         Wheelchair     Assist Is the patient using a wheelchair?: Yes Type of Wheelchair: Power    Wheelchair assist level: Supervision/Verbal cueing Max wheelchair distance: 500'+    Wheelchair 50 feet with 2 turns activity    Assist        Assist Level: Supervision/Verbal cueing   Wheelchair 150 feet activity     Assist      Assist Level: Supervision/Verbal cueing   Blood pressure 116/66, pulse 72, temperature 98.3 F (36.8 C), temperature source Oral, resp. rate 18, weight 127.7 kg, SpO2 99%.  Medical Problem List and Plan: 1. 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  Antithrombotics: -DVT/anticoagulation:  Pharmaceutical: Lovenox              -antiplatelet therapy: ASA 3. Pain Management: Tylenol  prn.  4. Mood/Behavior/Sleep: LCSW to follow for evaluation and support.              -antipsychotic agents: N/A 5. Neuropsych/cognition: This patient is capable of making decisions on his own behalf. 6. Skin/Wound Care:               --Gerhardt's butt cream to MASD on buttocks             --Diflucan for candida cruris.              --continue pressure relief measures to left elbow/left heel.   -abrasions  healing along LUE 7. Fluids/Electrolytes/Nutrition:  encourage appropriate PO    Latest Ref Rng & Units 08/08/2023    5:11 AM 08/05/2023    5:33 AM 08/01/2023    6:36 AM  BMP  Glucose 70 - 99 mg/dL 315  176  160   BUN 8 - 23 mg/dL 17  17  16    Creatinine 0.61 - 1.24 mg/dL 7.37  1.19  1.24   Sodium 135 - 145 mmol/L 141  139  140   Potassium 3.5 - 5.1 mmol/L 3.7  3.8  3.6   Chloride 98 - 111 mmol/L 110  106  107   CO2 22 - 32 mmol/L 23  23  24    Calcium  8.9 - 10.3 mg/dL 8.4  8.4  8.8      1/61 recent labs in range 8. T2DM: Hgb A1C- 8.2 (PTA) Was taking Tresiba 54 units in am ( now on Semglee  22U ) and  Metformin  1000 mg--bid (restart Metformin  at 500mg  BID)   CBG (last 3)  Recent Labs    08/11/23 0611 08/11/23 1137 08/11/23 1636  GLUCAP 128* 131* 144*   Metformin  500mg  BID  novalog to 6U TID Am CBG on low side will reduce semglee  to 28U 5/18 well controlled overall, continue current  9.   HTN: Monitor BP TID--losartan  50 mg daily--was held for a few days to prevent hypoperfusion             -- parameters to hold prn sitting SBP< 120.   5/17-18 stable, continue current regimen and monitor Vitals:   08/11/23 0527 08/11/23 1503  BP: (!) 140/68 116/66  Pulse: 75 72  Resp: 17 18  Temp: 98.4 F (36.9 C) 98.3 F (36.8 C)  SpO2: 96% 99%    10.  Myasthenia Gravis Mycophenolate  1500/1250mg  --Dr. Franklin Ito at [facility].              Prednisone  5 mg PTA--->has been weaned down to 2 mg daily, monitor for Ptosis and generalized proximal weakness Asymptomatic for MG 5/16- would not use Botulinum toxin unless cleared by his neurologist, phenol motor point injection a possibility  5/17-8 denies any shortness of breath 11. Hypothyroid: Stable on levothyroxine  for supplement.  12.  Dyslipidemia: Was on pravastatin 80 mg daily 13. H/o Kidney stones: On Sodium bicarb BID to prevent stones.  14. Constipation: On miralax  daily             --sorbitol  added.   -last bm 5/1--> give sorbitol  today  5/4  5/10- LBM 5/8-   5/11- LBM 3 days ago- will order Sorbitol  this afternoon if doesn't go  LBM 5/16-continue to monitor bowel function 15. H/o GERD: Has been off PPI.  16.  Mild anemia check stool guaic on ASA and Lovenox     Latest Ref Rng & Units 08/08/2023    5:11 AM 08/05/2023    5:33 AM 08/01/2023    6:36 AM  CBC  WBC 4.0 - 10.5 K/uL 7.5  6.1  7.1   Hemoglobin 13.0 - 17.0 g/dL 09.6  04.5  40.9   Hematocrit 39.0 - 52.0 % 33.0  35.2  35.4   Platelets 150 - 400 K/uL 339  365  407   5/16 slow decrease in hgb over last 2 weeks.   -check stool for OB  -f/u labs Monday  5/17 Lower extremity edema  - Patient reports this is chronic, did not elevate legs yesterday.  He will try to elevate his legs in bed.  Can consider low-dose Lasix if does not improve.  Has some abrasions on his left leg, appear okay.  -5/18 improved today, continue to monitor   LOS: 23 days A FACE TO FACE EVALUATION WAS PERFORMED  Lylia Sand 08/11/2023, 5:00 PM

## 2023-08-11 NOTE — Plan of Care (Signed)
  Problem: RH BOWEL ELIMINATION Goal: RH STG MANAGE BOWEL WITH ASSISTANCE Description: STG Manage Bowel with mod I  Assistance. Outcome: Progressing Goal: RH STG MANAGE BOWEL W/MEDICATION W/ASSISTANCE Description: STG Manage Bowel with Medication with mod I Assistance. Outcome: Progressing   Problem: RH KNOWLEDGE DEFICIT Goal: RH STG INCREASE KNOWLEDGE OF HYPERTENSION Description: Patient and spouse will be able to manage HTN using educational resources for medications and dietary modification independently Outcome: Progressing   Problem: Consults Goal: RH STROKE PATIENT EDUCATION Description: See Patient Education module for education specifics  Outcome: Adequate for Discharge   Problem: RH SAFETY Goal: RH STG ADHERE TO SAFETY PRECAUTIONS W/ASSISTANCE/DEVICE Description: STG Adhere to Safety Precautions With cues Assistance/Device. Outcome: Adequate for Discharge   Problem: RH KNOWLEDGE DEFICIT Goal: RH STG INCREASE KNOWLEDGE OF DIABETES Description: Patient and spouse will be able to manage DM using educational resources for medications and dietary modification independently Outcome: Adequate for Discharge

## 2023-08-12 ENCOUNTER — Other Ambulatory Visit (HOSPITAL_COMMUNITY): Payer: Self-pay

## 2023-08-12 LAB — BASIC METABOLIC PANEL WITH GFR
Anion gap: 10 (ref 5–15)
BUN: 13 mg/dL (ref 8–23)
CO2: 23 mmol/L (ref 22–32)
Calcium: 8.5 mg/dL — ABNORMAL LOW (ref 8.9–10.3)
Chloride: 107 mmol/L (ref 98–111)
Creatinine, Ser: 1.15 mg/dL (ref 0.61–1.24)
GFR, Estimated: >60 mL/min (ref >60)
Glucose, Bld: 133 mg/dL — ABNORMAL HIGH (ref 70–99)
Potassium: 3.7 mmol/L (ref 3.5–5.1)
Sodium: 140 mmol/L (ref 135–145)

## 2023-08-12 LAB — CBC
HCT: 33.9 % — ABNORMAL LOW (ref 39.0–52.0)
Hemoglobin: 10.8 g/dL — ABNORMAL LOW (ref 13.0–17.0)
MCH: 29.7 pg (ref 26.0–34.0)
MCHC: 31.9 g/dL (ref 30.0–36.0)
MCV: 93.1 fL (ref 80.0–100.0)
Platelets: 336 10*3/uL (ref 150–400)
RBC: 3.64 MIL/uL — ABNORMAL LOW (ref 4.22–5.81)
RDW: 12.8 % (ref 11.5–15.5)
WBC: 7.7 10*3/uL (ref 4.0–10.5)
nRBC: 0 % (ref 0.0–0.2)

## 2023-08-12 LAB — GLUCOSE, CAPILLARY
Glucose-Capillary: 150 mg/dL — ABNORMAL HIGH (ref 70–99)
Glucose-Capillary: 130 mg/dL — ABNORMAL HIGH (ref 70–99)
Glucose-Capillary: 152 mg/dL — ABNORMAL HIGH (ref 70–99)
Glucose-Capillary: 135 mg/dL — ABNORMAL HIGH (ref 70–99)

## 2023-08-12 MED ORDER — INSULIN DEGLUDEC 100 UNIT/ML ~~LOC~~ SOPN: 28.0000 [IU] | PEN_INJECTOR | Freq: Every day | SUBCUTANEOUS | Status: DC

## 2023-08-12 MED ORDER — ATORVASTATIN CALCIUM 80 MG PO TABS
80.0000 mg | ORAL_TABLET | Freq: Every day | ORAL | 0 refills | Status: DC
  Filled 2023-08-12: qty 30, 30d supply, fill #0

## 2023-08-12 MED ORDER — MELATONIN 5 MG PO TABS
5.0000 mg | ORAL_TABLET | Freq: Every evening | ORAL | 0 refills | Status: AC | PRN
  Filled 2023-08-12: qty 30, 30d supply, fill #0

## 2023-08-12 MED ORDER — POLYETHYLENE GLYCOL 3350 17 GM/SCOOP PO POWD
17.0000 g | Freq: Every day | ORAL | 0 refills | Status: AC
  Filled 2023-08-12: qty 476, 28d supply, fill #0

## 2023-08-12 NOTE — Progress Notes (Signed)
 Occupational Therapy Discharge Summary  Patient Details  Name: [name]. MRN: [id number] Date of Birth: 03/29/1950  Date of Discharge from OT service:Aug 12, 2023  Today's Date: 08/12/2023   Patient has met 12 of 12 long term goals due to improved activity tolerance, improved balance, postural control, ability to compensate for deficits, functional use of  LEFT upper extremity, and improved coordination.  Pt currently completes ADL transfers with RW and MINA, pt completes UB dressing with supervision with + time and LB ADLS with MODA. Pt requires MODA for 3/3 toileting tasks. Pt continues to present with impaired balance and LUE hemiparesis. Pts wife was present for family education and demonstrated the appropriate level of assistance for ADLs and functional mobility. Patient to discharge at overall min to mod A level.  Patient's care partner is independent to provide the necessary physical and cognitive assistance at discharge.    Reasons goals not met: NA  Recommendation:  Patient will benefit from ongoing skilled OT services in outpatient setting to continue to advance functional skills in the area of BADL and Reduce care partner burden.  Equipment: BSC  Reasons for discharge: treatment goals met and discharge from hospital  Patient/family agrees with progress made and goals achieved: Yes  OT Discharge Precautions/Restrictions  Restrictions Weight Bearing Restrictions Per Provider Order: No  Pain Pain Assessment Pain Scale: 0-10 Pain Score: 0-No pain ADL ADL Eating: Modified independent Where Assessed-Eating: Chair Grooming: Modified independent Where Assessed-Grooming: Chair Upper Body Bathing: Minimal assistance Where Assessed-Upper Body Bathing: Shower Lower Body Bathing: Minimal assistance Where Assessed-Lower Body Bathing: Shower Upper Body Dressing: Supervision/safety Where Assessed-Upper Body Dressing: Edge of bed Lower Body Dressing: Moderate  assistance Where Assessed-Lower Body Dressing: Edge of bed Toileting: Moderate assistance Where Assessed-Toileting: Teacher, adult education: Curator Method: Surveyor, minerals: Raised toilet seat Film/video editor: Insurance underwriter Method: Warden/ranger: Emergency planning/management officer ADL Comments: pt completes ADL transfers with MIN A via stand pivot, supervision for UB ADLS, MODA for LB ADLs. MODI for grooming tasks using hemistrategies. ADLs limited by body habitus, impaired balance and LUE hemiparesis Vision Baseline Vision/History: 0 No visual deficits (cataract removal hx) Patient Visual Report: No change from baseline Vision Assessment?: Yes Eye Alignment: Within Functional Limits Ocular Range of Motion: Within Functional Limits Alignment/Gaze Preference: Within Defined Limits Tracking/Visual Pursuits: Able to track stimulus in all quads without difficulty Saccades: Within functional limits Convergence: Within functional limits Visual Fields: Left visual field deficit;Other (comment) (mild L inattention) Perception  Perception: Impaired Perception-Other Comments: L inattention, improved from eval Praxis Praxis: Impaired Praxis Impairment Details: Motor planning Cognition Cognition Overall Cognitive Status: Impaired/Different from baseline Arousal/Alertness: Awake/alert Memory: Impaired Memory Impairment: Decreased recall of new information;Decreased short term memory Sustained Attention: Impaired Selective Attention: Appears intact Awareness: Appears intact Problem Solving: Impaired Problem Solving Impairment: Functional complex;Verbal complex Brief Interview for Mental Status (BIMS) Repetition of Three Words (First Attempt): 3 Temporal Orientation: Year: Correct Temporal Orientation: Month: Accurate within 5 days Temporal Orientation: Day: Correct Recall: "Sock": Yes, no cue  required Recall: "Blue": Yes, no cue required Recall: "Bed": Yes, no cue required BIMS Summary Score: 15 Sensation Sensation Light Touch: Impaired Detail Light Touch Impaired Details: Impaired LUE;Impaired LLE Hot/Cold: Impaired Detail Hot/Cold Impaired Details: Impaired LUE;Impaired LLE Proprioception: Impaired by gross assessment Stereognosis: Not tested Coordination Gross Motor Movements are Fluid and Coordinated: No Fine Motor Movements are Fluid and Coordinated: No Motor  Motor Motor: Other (  comment) (L hemi) Motor - Discharge Observations: L hemi with LUE>LLE and mild inattention to L that has improved since evaluation Mobility  Bed Mobility Bed Mobility: Sit to Supine;Supine to Sit Supine to Sit: Minimal Assistance - Patient > 75% Sit to Supine: Minimal Assistance - Patient > 75% Transfers Sit to Stand: Minimal Assistance - Patient > 75% Stand to Sit: Minimal Assistance - Patient > 75%  Trunk/Postural Assessment  Cervical Assessment Cervical Assessment: Within Functional Limits Thoracic Assessment Thoracic Assessment: Exceptions to [facility] (rounded shoulders) Lumbar Assessment Lumbar Assessment: Exceptions to [facility] (psoterior pelvic tilt) Postural Control Postural Control: Deficits on evaluation Trunk Control: improved since evaluation Righting Reactions: decreased Protective Responses: decreased Postural Limitations: decreased  Balance Static Sitting Balance Static Sitting - Balance Support: Feet supported;Right upper extremity supported Static Sitting - Level of Assistance: 5: Stand by assistance Dynamic Sitting Balance Dynamic Sitting - Balance Support: Feet supported;No upper extremity supported Dynamic Sitting - Level of Assistance: 5: Stand by assistance Dynamic Sitting - Balance Activities: Lateral lean/weight shifting;Ball toss;Reaching across midline Static Standing Balance Static Standing - Balance Support: Bilateral upper extremity supported Static  Standing - Level of Assistance: 5: Stand by assistance Dynamic Standing Balance Dynamic Standing - Balance Support: During functional activity;Left upper extremity supported Dynamic Standing - Level of Assistance: Other (comment) (CGA) Dynamic Standing - Balance Activities: Reaching across midline;Reaching for objects Extremity/Trunk Assessment RUE Assessment RUE Assessment: Within Functional Limits LUE Assessment LUE Assessment: Exceptions to [facility] Active Range of Motion (AROM) Comments: ~100* shoulder flexion, active elbow ROM LUE Body System: Neuro Brunstrum levels for arm and hand: Arm;Hand Brunstrum level for arm: Stage V Relative Independence from Synergy Brunstrum level for hand: Stage V Independence from basic synergies   Willadean Hark 08/12/2023, 1:21 PM

## 2023-08-12 NOTE — Progress Notes (Signed)
 PROGRESS NOTE   Subjective/Complaints:  Pt up in chair. No new issues this morning. Left leg getting stronger. Has gone without knee cage at times. Excited about getting home  ROS: Patient denies fever, rash, sore throat, blurred vision, dizziness, nausea, vomiting, diarrhea, cough, shortness of breath or chest pain, joint or back/neck pain, headache, or mood change.     Objective:   No results found. Recent Labs    08/12/23 0609  WBC 7.7  HGB 10.8*  HCT 33.9*  PLT 336        Recent Labs    08/12/23 0609  NA 140  K 3.7  CL 107  CO2 23  GLUCOSE 133*  BUN 13  CREATININE 1.15  CALCIUM  8.5*         Intake/Output Summary (Last 24 hours) at 08/12/2023 1139 Last data filed at 08/12/2023 0811 Gross per 24 hour  Intake 708 ml  Output 540 ml  Net 168 ml        Physical Exam: Vital Signs Blood pressure 134/69, pulse 64, temperature 98.2 F (36.8 C), resp. rate 17, weight 127.7 kg, SpO2 96%.  Constitutional: No distress . Vital signs reviewed. HEENT: NCAT, EOMI, oral membranes moist Neck: supple Cardiovascular: RRR without murmur. No JVD    Respiratory/Chest: CTA Bilaterally without wheezes or rales. Normal effort    GI/Abdomen: BS +, non-tender, non-distended Ext: no clubbing, cyanosis, or edema Psych: pleasant and cooperative  Skin: healing scabs on left side, mepilex in place Neurologic: Cranial nerves II through XII intact, motor strength is 5/5 in right and 3-/5 left deltoid, bicep, tricep, WE tr,  0/5 FE and  2+ grip, 3 to 3+ hip flexor, knee extensors, ankle dorsiflexor and plantar flexor. Flexor tone in left fingers/wrist.  Gait with mod assist, hyperextension at the left knee does have adequate clearance of the left foot.  No pain with ambulation Cerebellar exam remains limited by weakness on the left side  Prior neuro assessment is c/w today's exam 08/12/2023.  Musculoskeletal: Full passive   range of motion in all 4 extremities. No joint swelling     Assessment/Plan: 1. Functional deficits which require 3+ hours per day of interdisciplinary therapy in a comprehensive inpatient rehab setting. Physiatrist is providing close team supervision and 24 hour management of active medical problems listed below. Physiatrist and rehab team continue to assess barriers to discharge/monitor patient progress toward functional and medical goals  Care Tool:  Bathing    Body parts bathed by patient: Left arm, Chest, Abdomen, Right upper leg, Left upper leg, Face, Right arm, Front perineal area, Right lower leg, Left lower leg (used long sponge to reach R arm)   Body parts bathed by helper: Buttocks     Bathing assist Assist Level: Minimal Assistance - Patient > 75%     Upper Body Dressing/Undressing Upper body dressing   What is the patient wearing?: Pull over shirt    Upper body assist Assist Level: Minimal Assistance - Patient > 75%    Lower Body Dressing/Undressing Lower body dressing      What is the patient wearing?: Pants     Lower body assist Assist for lower body dressing: Moderate  Assistance - Patient 50 - 74% (used reacher to don over feet)     Mudlogger Assist for toileting: Moderate Assistance - Patient 50 - 74%     Transfers Chair/bed transfer  Transfers assist     Chair/bed transfer assist level: Minimal Assistance - Patient > 75%     Locomotion Ambulation   Ambulation assist   Ambulation activity did not occur: Safety/medical concerns (ambulated 50' with RHR in hallway mod assist and +2 WC follow)  Assist level: Moderate Assistance - Patient 50 - 74% Assistive device: Walker-rolling (L swedish knee cage) Max distance: 18   Walk 10 feet activity   Assist  Walk 10 feet activity did not occur: Safety/medical concerns  Assist level: Moderate Assistance - Patient - 50 - 74% Assistive device: Walker-rolling (L  swedish knee cage)   Walk 50 feet activity   Assist Walk 50 feet with 2 turns activity did not occur: Safety/medical concerns (decreased endurance to do so safely)         Walk 150 feet activity   Assist Walk 150 feet activity did not occur: Safety/medical concerns (decreased endurance to do so safely)         Walk 10 feet on uneven surface  activity   Assist Walk 10 feet on uneven surfaces activity did not occur: Safety/medical concerns (decreased endurance to do so safely)         Wheelchair     Assist Is the patient using a wheelchair?: Yes Type of Wheelchair: Power    Wheelchair assist level: Supervision/Verbal cueing Max wheelchair distance: 500'+    Wheelchair 50 feet with 2 turns activity    Assist        Assist Level: Supervision/Verbal cueing   Wheelchair 150 feet activity     Assist      Assist Level: Supervision/Verbal cueing   Blood pressure 134/69, pulse 64, temperature 98.2 F (36.8 C), resp. rate 17, weight 127.7 kg, SpO2 96%.  Medical Problem List and Plan: 1. 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 May not need soon!  2.  Antithrombotics: -DVT/anticoagulation:  Pharmaceutical: Lovenox              -antiplatelet therapy: ASA 3. Pain Management: Tylenol  prn.  4. Mood/Behavior/Sleep: LCSW to follow for evaluation and support.              -antipsychotic agents: N/A 5. Neuropsych/cognition: This patient is capable of making decisions on his own behalf. 6. Skin/Wound Care:               --Gerhardt's butt cream to MASD on buttocks             --Diflucan for candida cruris.              --continue pressure relief measures to left elbow/left heel.   -abrasions healing along LUE, LLE healing 7.  Fluids/Electrolytes/Nutrition:  encourage appropriate PO    Latest Ref Rng & Units 08/12/2023    6:09 AM 08/08/2023    5:11 AM 08/05/2023    5:33 AM  BMP  Glucose 70 - 99 mg/dL 259  134  115   BUN 8 - 23 mg/dL 13  17  17    Creatinine 0.61 - 1.24 mg/dL 4.09  8.11  9.14   Sodium 135 - 145 mmol/L 140  141  139   Potassium 3.5 - 5.1 mmol/L 3.7  3.7  3.8   Chloride 98 - 111 mmol/L 107  110  106   CO2 22 - 32 mmol/L 23  23  23    Calcium  8.9 - 10.3 mg/dL 8.5  8.4  8.4      7/82 recent labs in range 8. T2DM: Hgb A1C- 8.2 (PTA) Was taking Tresiba  54 units in am ( now on Semglee  22U ) and  Metformin  1000 mg--bid (restart Metformin  at 500mg  BID)   CBG (last 3)  Recent Labs    08/11/23 1636 08/11/23 2119 08/12/23 0640  GLUCAP 144* 125* 150*   Metformin  500mg  BID  novolog  to 6U TID Am CBG on low side will reduce semglee  to 28U 5/19 controlled--continue regimen 9.   HTN: Monitor BP TID--losartan  50 mg daily--was held for a few days to prevent hypoperfusion             -- parameters to hold prn sitting SBP< 120.   5/17-19 stable, continue current regimen and monitor Vitals:   08/11/23 2234 08/12/23 0643  BP: (!) 129/58 134/69  Pulse: (!) 57 64  Resp: 17 17  Temp: 98 F (36.7 C) 98.2 F (36.8 C)  SpO2: 91% 96%    10.  Myasthenia Gravis Mycophenolate  1500/1250mg  --Dr. Franklin Ito at [facility].              Prednisone  5 mg PTA--->has been weaned down to 2 mg daily, monitor for Ptosis and generalized proximal weakness Asymptomatic for MG 5/16- would not use Botulinum toxin unless cleared by his neurologist, phenol motor point injection a possibility  5/17-19 has periodic sx but appear stable 11. Hypothyroid: Stable on levothyroxine  for supplement.  12.  Dyslipidemia: Was on pravastatin 80 mg daily 13. H/o Kidney stones: On Sodium bicarb BID to prevent stones.  14. Constipation: On miralax  daily             --sorbitol  added.   -last bm 5/1--> give sorbitol  today 5/4  5/10- LBM 5/8-   5/11- LBM 3  days ago- will order Sorbitol  this afternoon if doesn't go  LBM 5/17-continue to monitor bowel function 15. H/o GERD: Has been off PPI.  16.  Mild anemia check stool guaic on ASA and Lovenox     Latest Ref Rng & Units 08/12/2023    6:09 AM 08/08/2023    5:11 AM 08/05/2023    5:33 AM  CBC  WBC 4.0 - 10.5 K/uL 7.7  7.5  6.1   Hemoglobin 13.0 - 17.0 g/dL 95.6  21.3  08.6   Hematocrit 39.0 - 52.0 % 33.9  33.0  35.2   Platelets 150 - 400 K/uL 336  339  365   5/19 stool ob negative, hgb sl up today---no gross loss 5/17 Lower extremity edema  - Patient reports this is chronic, did not elevate legs yesterday.  He will try to elevate his legs in bed.  Can consider low-dose Lasix if does not improve.  Has some abrasions on his left leg, appear okay.  -5/18-19 improved today, continue to monitor   LOS: 24 days A FACE TO FACE EVALUATION WAS PERFORMED  Rawland Caddy 08/12/2023, 11:39 AM

## 2023-08-12 NOTE — Plan of Care (Signed)
  Problem: RH SAFETY Goal: RH STG ADHERE TO SAFETY PRECAUTIONS W/ASSISTANCE/DEVICE Description: STG Adhere to Safety Precautions With cues Assistance/Device. Outcome: Progressing   Problem: RH PAIN MANAGEMENT Goal: RH STG PAIN MANAGED AT OR BELOW PT'S PAIN GOAL Description: < 4 with prns Outcome: Progressing

## 2023-08-12 NOTE — Progress Notes (Signed)
 Inpatient Rehabilitation Discharge Medication Review by a Pharmacist  A complete drug regimen review was completed for this patient to identify any potential clinically significant medication issues.  High Risk Drug Classes Is patient taking? Indication by Medication  Antipsychotic No   Anticoagulant No   Antibiotic No   Opioid No   Antiplatelet No   Hypoglycemics/insulin  Yes Insulin , metformin  - DM  Vasoactive Medication Yes Losartan  - BP  Chemotherapy No   Other Yes Atorvastatin  - HLD Cyanocobalamin - supplement Levothyroxine  - low thyroid Mycophenolate , prednisone - MG Sodium bicarbonate - supplement     Type of Medication Issue Identified Description of Issue Recommendation(s)  Drug Interaction(s) (clinically significant)     Duplicate Therapy     Allergy     No Medication Administration End Date     Incorrect Dose     Additional Drug Therapy Needed     Significant med changes from prior encounter (inform family/care partners about these prior to discharge).    Other       Clinically significant medication issues were identified that warrant physician communication and completion of prescribed/recommended actions by midnight of the next day:  No  Name of provider notified for urgent issues identified:   Provider Method of Notification:     Pharmacist comments:   Time spent performing this drug regimen review (minutes):  20 minutes  Lennice Quivers, PharmD

## 2023-08-12 NOTE — Progress Notes (Signed)
 Inpatient Rehabilitation Care Coordinator Discharge Note   Patient Details  Name: [name]. MRN: [id number] Date of Birth: [birth year]   Discharge location: HOME WITH WIFE WHO HAS BEEN TRAINED ON HIS CARE FOR DC  Length of Stay: 25 days  Discharge activity level: MIN-CGA WHEELCHAIR LEVEL  Home/community participation: ACTIVE  Patient response WU:[id number] Literacy - How often do you need to have someone help you when you read instructions, pamphlets, or other written material from your doctor or pharmacy?: Never  Patient response YN:WGNFAO Isolation - How often do you feel lonely or isolated from those around you?: Never  Services provided included: MD, RD, PT, OT, SLP, RN, CM, Pharmacy, Neuropsych, SW  Financial Services:  Field seismologist Utilized: Private Insurance [facility] MEDICARE  Choices offered to/list presented to: PT AND WIFE  Follow-up services arranged:  Outpatient, DME, Patient/Family has no preference for HH/DME agencies    Outpatient Servicies: [facility] OP PT  OT  SP  WILL CALL TO ARRANGE FOLLOW UP APPOINTMENTS DME : [facility]    Patient response to transportation need: Is the patient able to respond to transportation needs?: Yes In the past 12 months, has lack of transportation kept you from medical appointments or from getting medications?: No In the past 12 months, has lack of transportation kept you from meetings, work, or from getting things needed for daily living?: No   Patient/Family verbalized understanding of follow-up arrangements:  Yes  Individual responsible for coordination of the follow-up plan: [name]-WIFE [phone]  Confirmed correct DME delivered: Mardell Shade 08/12/2023    Comments (or additional information):WIFE WAS IN FOR FAMILY TRAINING WENT WELL AND BOTH FEEL READY FOR DC.  Summary of Stay    Date/Time Discharge Planning CSW  08/07/23 1618 D/c to home with his wife. SW will confirm there are no  barriersto discharge. AAC  07/31/23 1013 D/c to home with his wife. SW will confirm there are no barriersto discharge. AAC  07/24/23 1532 D/c to home with his wife. SW will confirm there are no barriersto discharge. AAC       Hansini Clodfelter G

## 2023-08-12 NOTE — Plan of Care (Signed)
  Problem: RH Problem Solving Goal: LTG Patient will demonstrate problem solving for (SLP) Description: LTG:  Patient will demonstrate problem solving for basic/complex daily situations with cues  (SLP) Outcome: Not Met (cont to require minA)   Problem: RH Memory Goal: LTG Patient will demonstrate ability for day to day (SLP) Description: LTG:   Patient will demonstrate ability for day to day recall/carryover during cognitive/linguistic activities with assist  (SLP) Outcome: Completed/Met   Problem: RH Attention Goal: LTG Patient will demonstrate this level of attention during functional activites (SLP) Description: LTG:  Patient will will demonstrate this level of attention during functional activites (SLP) Outcome: Completed/Met

## 2023-08-12 NOTE — Progress Notes (Signed)
 Physical Therapy Session Note  Patient Details  Name: [name]. MRN: [id number] Date of Birth: [birth year]  Today's Date: 08/12/2023 PT Individual Time: 0806-0905 PT Individual Time Calculation (min): 59 min   Short Term Goals: Week 1:  PT Short Term Goal 1 (Week 1): Pt will complete bed mobility mod assist PT Short Term Goal 1 - Progress (Week 1): Progressing toward goal PT Short Term Goal 2 (Week 1): Pt will complete sit<>stand transfers with LRAD with min assist PT Short Term Goal 2 - Progress (Week 1): Progressing toward goal PT Short Term Goal 3 (Week 1): Pt will complete bed<>chair transfers with LRAD with mod assist PT Short Term Goal 3 - Progress (Week 1): Progressing toward goal PT Short Term Goal 4 (Week 1): Pt will ambulate with LRAD 23' with mod assist PT Short Term Goal 4 - Progress (Week 1): Progressing toward goal Week 2:  PT Short Term Goal 1 (Week 2): pt will perform bed mobility with mod A with LRAD PT Short Term Goal 1 - Progress (Week 2): Progressing toward goal PT Short Term Goal 2 (Week 2): pt will perform bed to chair transfer with min A with LRAD PT Short Term Goal 2 - Progress (Week 2): Met PT Short Term Goal 3 (Week 2): pt will ambulate 50 feet with LRAD and mod A PT Short Term Goal 3 - Progress (Week 2): Progressing toward goal PT Short Term Goal 4 (Week 2): pt will participate in custom WC eval PT Short Term Goal 4 - Progress (Week 2): Met PT Short Term Goal 5 (Week 2): pt will initiate WC propulsion with hemi technique PT Short Term Goal 5 - Progress (Week 2): Not met Week 3:  PT Short Term Goal 1 (Week 3): STG = LTG d/t ELOS  Skilled Therapeutic Interventions/Progress Updates:  PT instructed pt in Grad day assessment to measure progress toward goals. See above for details. CARETool mobility assessment  also completed; see CAREtool tab in navigator for details.  Patient seated upright in w/c on entrance to room. Pt does not notice therapist  standing at doorway to his L side. Patient alert and agreeable to PT session.   Patient with no pain complaint at start of session.  Therapeutic Activity: Discussed with pt his questions re: w/c and potential return policy if family feels that they would prefer a powered chair that they found online. Pt with questions re: next day's sequencing of events and pick up/ drop off of loaner power chair tomorrow on d/c day. Reached out to [employer] and pwc to be picked up at 9am. Will take pwc directly to pt's home. Make sure to have someone at the home to receive it. MD present for morning rounding.   After d/c pt will f/u with Dr. Sharl Davies in OP PM&R office. Pt will also have a f/u with neurologist.   Performed vision and sensation assessment - please see discharge report.   Transfers: Pt performed sit<>stand transfers throughout session with elevated seat of pwc. Requires CGA/ SBA to RW. Pt relates that he has a goal to be able to get into his own vehicle which is very tall with a running board. Related to pt to tell next therapist that this is a goal in order to progress toward. Then guided to demo hold to LLE in order to raise RLE from floor. Discussed sequencing of backward step up and boost when RLE is strong enough to boost and LLE is strong enough in hip  flexion and trunk control to hip hike to seat.   Patient seated in pwc at end of session with brakes locked, improved tilt to reduce pressure into BLE, belt alarm set, and all needs within reach.  Therapy Documentation Precautions:  Precautions Precautions: Fall Precaution/Restrictions Comments: falls, L hemi, mild L sided inattention that has improved since evaluation Restrictions Weight Bearing Restrictions Per Provider Order: No  Pain:  No pain related this session.   Therapy/Group: Individual Therapy  Donne Gage PT, DPT, CSRS 08/12/2023, 10:19 AM

## 2023-08-12 NOTE — Progress Notes (Signed)
 Occupational Therapy Session Note  Patient Details  Name: [name]. MRN: [id number] Date of Birth: 01-10-1951  Today's Date: 08/12/2023 OT Individual Time: 8295-6213 OT Individual Time Calculation (min): 73 min    Short Term Goals: Week 3:  OT Short Term Goal 1 (Week 3): Pt will don a shirt with supervision. OT Short Term Goal 1 - Progress (Week 3): Met OT Short Term Goal 2 (Week 3): Pt will be able to pull pants over hips with 20% help or less. OT Short Term Goal 2 - Progress (Week 3): Met OT Short Term Goal 3 (Week 3): Pt will complete stand pivot transfers to toilet with RW with min A. OT Short Term Goal 3 - Progress (Week 3): Met  Skilled Therapeutic Interventions/Progress Updates:  Pt greeted seated in PWC, pt very excited to demo his trace digit extension to this OTA, pt agreeable to OT intervention.     Spent time completing required DC assessments as indicated below:  Transfers/bed mobility/functional mobility: pt completed all w/c mobility MODI.     Exercises: education provided on UE HEP for home therex included: -digit flexion/extension with compliant block -wrist flexion/extension -continued work on functional reaching with LUE in conjunction with functional grasp I.e hand to mouth pattern for ADLs  Pt also given HEP for self ROM therex included:  Shoulder flexion, 1. Shoulder: Forward Arm Lift  ? Interlock your fingers, or hold your wrist. ? With your elbows straight and thumbs facing the ceiling,  lift your arms to shoulder height. ? Slowly lower your arms to starting position. ? Hold for __3__ seconds. ? Repeat __10__ times. Self-range of motion exercises for the arm and hand Page - 3 2. Shoulder: "Rock the [employer] ? Hold your affected arm by supporting the elbow, forearm  and wrist (as if cradling a baby). ? Slowly move your arms to the side, away from your body, lifting  to shoulder height. Repeat this motion in the other direction. ?  Slowly rock your arms side-to-side, and keep your body  from turning. ? Repeat __10__ times. Self-range of motion exercises for the arm and hand Page - 4 3. Shoulder: Rotation Stretch ? Interlock your fingers, or hold your wrist. ? With your elbows bent at 90 degrees, keep your affected arm at  your side. ? Slowly guide your affected arm across your stomach. ? Hold for __3__ seconds. ? Slowly guide your forearm away from your body, keeping your  elbow at your side. ? Hold for _3___ seconds. ? Repeat ___10_ times. Self-range of motion exercises for the arm and hand Page - 5 4. Elbow Stretch ? Interlock your fingers, or hold your wrist. ? Start with your arms straight. ? Slowly bend your elbows. ? Hold for _3___ seconds. ? Slowly return to starting position, with elbows straight. ? Repeat ___10_ times. Self-range of motion exercises for the arm and hand Page - 6 5. Forearm Stretch ? Interlock your fingers, or clasp your hands together. ? Place your affected hand on your lap or supported on a table. ? Rotate your hands so the palm of your affected hand is facing  downwards. ? Hold for __3__ seconds. ? Rotate your hands so the palm of your affected hand is  facing upwards. ? Hold for __3__ seconds. ? Repeat _10___ times. Self-range of motion exercises for the arm and hand Page - 7 6. Wrist: Side-to-Side Stretch ? Interlock your fingers, or clasp your hands together. ? Slowly bend your wrist  to the left, then to the right. ? Hold for __3__ seconds. ? Repeat ___10_ times each direction. Self-range of motion exercises for the arm and hand Page - 8 7. Wrist: Forwards and Backwards Stretch  ? Interlock your fingers, or clasp your hands together. ? Place your hand on your lap or supported on a table. ? Slowly bend your wrist towards you, then away from you. ? Hold for __3__ seconds. ? Repeat ___10_ times each direction. Self-range of motion exercises for the arm and  hand Page - 9 8. Fingers: Bending and Straightening  ? Place your affected hand on your lap or supported on a table. ? Individually bend and straighten each finger. ? When straightening fingers, do not bend fingers backwards.  Only straighten to a neutral position as if your hand was resting  flat on a table. ? Repeat ___10_ times each finger.   1:1 NMES applied to L wrist extensors at the below settings, focused on activating wrist extensors when channel was activated with pt able to complete digit flexion when channel was idle:    Ratio 1:3 Rate 35 pps Waveform- Asymmetric Ramp 1.0 Pulse 300 Intensity- 28  Duration -   7 mins                 Ended session with pt seated in PWC with all needs within reach.   Therapy Documentation Precautions:  Precautions Precautions: Fall Precaution/Restrictions Comments: falls, L hemi, mild L sided inattention that has improved since evaluation Restrictions Weight Bearing Restrictions Per Provider Order: No  Pain: no pain   Vision Baseline Vision/History: 0 No visual deficits (cataract removal hx) Patient Visual Report: No change from baseline Vision Assessment?: Yes Eye Alignment: Within Functional Limits Ocular Range of Motion: Within Functional Limits Alignment/Gaze Preference: Within Defined Limits Tracking/Visual Pursuits: Able to track stimulus in all quads without difficulty Saccades: Within functional limits Convergence: Within functional limits Visual Fields: Left visual field deficit;Other (comment) (mild L inattention) Perception  Perception: Impaired Perception-Other Comments: L inattention, improved from eval Praxis Praxis: Impaired Praxis Impairment Details: Motor planning Cognition Cognition Overall Cognitive Status: Impaired/Different from baseline Arousal/Alertness: Awake/alert Memory: Impaired Memory Impairment: Decreased recall of new information;Decreased short term memory Sustained Attention:  Impaired Selective Attention: Appears intact Awareness: Appears intact Problem Solving: Impaired Problem Solving Impairment: Functional complex;Verbal complex Brief Interview for Mental Status (BIMS) Repetition of Three Words (First Attempt): 3 Temporal Orientation: Year: Correct Temporal Orientation: Month: Accurate within 5 days Temporal Orientation: Day: Correct Recall: "Sock": Yes, no cue required Recall: "Blue": Yes, no cue required Recall: "Bed": Yes, no cue required BIMS Summary Score: 15 Sensation Sensation Light Touch: Impaired Detail Light Touch Impaired Details: Impaired LUE;Impaired LLE Hot/Cold: Impaired Detail Hot/Cold Impaired Details: Impaired LUE;Impaired LLE Proprioception: Impaired by gross assessment Stereognosis: Not tested Coordination Gross Motor Movements are Fluid and Coordinated: No Fine Motor Movements are Fluid and Coordinated: No Motor  Motor Motor: Other (comment) (L hemi) Motor - Discharge Observations: L hemi with LUE>LLE and mild inattention to L that has improved since evaluation Mobility  Bed Mobility Bed Mobility: Sit to Supine;Supine to Sit Supine to Sit: Minimal Assistance - Patient > 75% Sit to Supine: Minimal Assistance - Patient > 75% Transfers Sit to Stand: Minimal Assistance - Patient > 75% Stand to Sit: Minimal Assistance - Patient > 75%  Trunk/Postural Assessment  Cervical Assessment Cervical Assessment: Within Functional Limits Thoracic Assessment Thoracic Assessment: Exceptions to [facility] (rounded shoulders) Lumbar Assessment Lumbar Assessment: Exceptions to [facility] (psoterior pelvic  tilt) Postural Control Postural Control: Deficits on evaluation Trunk Control: improved since evaluation Righting Reactions: decreased Protective Responses: decreased Postural Limitations: decreased  Balance Extremity/Trunk Assessment RUE Assessment RUE Assessment: Within Functional Limits LUE Assessment LUE Assessment: Exceptions to  [facility] Active Range of Motion (AROM) Comments: ~100* shoulder flexion, active elbow ROM LUE Body System: Neuro Brunstrum levels for arm and hand: Arm;Hand Brunstrum level for arm: Stage V Relative Independence from Synergy Brunstrum level for hand: Stage V Independence from basic synergies    Therapy/Group: Individual Therapy  Mollie Anger [facility] 08/12/2023, 3:04 PM

## 2023-08-12 NOTE — Progress Notes (Signed)
 Speech Language Pathology Discharge Summary  Patient Details  Name: [name]. MRN: [id number] Date of Birth: 11/16/1950  Date of Discharge from SLP service:Aug 12, 2023  Today's Date: 08/12/2023 SLP Individual Time: 1101-1200 SLP Individual Time Calculation (min): 59 min   Skilled Therapeutic Interventions:   Skilled therapy session focused on cognitive goals. SLP facilitated session by providing supervision A for patient to complete word search puzzle targeting L attention. SLP then prompted patient to answer comprehension questions after a 15 minute delay to target short term memory. Patient answered questions with 95% accuracy independently. Patient left in chair with alarm set and call bell in reach. Continue POC    Patient has met 2 of 3 long term goals.  Patient to discharge at overall Supervision;Min level.  Reasons goals not met: cont to require min A for complex problem solving   Clinical Impression/Discharge Summary:  Pt has made good gains and has met 2 of 3 LTG's this admission due to improved cognition. Pt is currently an overall supervisionA for cognitive tasks such as L attention and short term memory, however continues to require minA for complex problem solving. Pt/family education complete and pt will discharge home with supervision from friends/family/etc. Pt would benefit from OP f/u ST services to maximize mildly complex problem solving skills in order to maximize functional independence.   Care Partner:  Caregiver Able to Provide Assistance: Yes  Type of Caregiver Assistance: Physical;Cognitive  Recommendation:  Outpatient SLP  Rationale for SLP Follow Up: Maximize cognitive function and independence   Equipment: n/a   Reasons for discharge: Discharged from hospital   Patient/Family Agrees with Progress Made and Goals Achieved: Yes    Reizel Calzada M.A., CCC-SLP 08/12/2023, 12:13 PM

## 2023-08-13 ENCOUNTER — Other Ambulatory Visit (HOSPITAL_COMMUNITY): Payer: Self-pay

## 2023-08-13 DIAGNOSIS — D649 Anemia, unspecified: Secondary | ICD-10-CM | POA: Insufficient documentation

## 2023-08-13 DIAGNOSIS — G7000 Myasthenia gravis without (acute) exacerbation: Secondary | ICD-10-CM | POA: Insufficient documentation

## 2023-08-13 DIAGNOSIS — I10 Essential (primary) hypertension: Secondary | ICD-10-CM | POA: Insufficient documentation

## 2023-08-13 DIAGNOSIS — K5900 Constipation, unspecified: Secondary | ICD-10-CM | POA: Insufficient documentation

## 2023-08-13 LAB — GLUCOSE, CAPILLARY: Glucose-Capillary: 128 mg/dL — ABNORMAL HIGH (ref 70–99)

## 2023-08-13 MED ORDER — INSULIN DEGLUDEC 100 UNIT/ML ~~LOC~~ SOPN: 28.0000 [IU] | PEN_INJECTOR | Freq: Every day | SUBCUTANEOUS | Status: AC

## 2023-08-13 NOTE — Discharge Summary (Addendum)
 Physician Discharge Summary  Patient ID: [name]. MRN: [id number] DOB/AGE: 1950-04-22 73 y.o.  Admit date: 07/19/2023 Discharge date: 08/13/2023  Discharge Diagnoses:  Principal Problem:   Acute ischemic right MCA stroke [facility]) Active Problems:   Chronic venous insufficiency   Type 2 diabetes mellitus (HCC)   High blood pressure   Anemia   Constipation   Myasthenia gravis (HCC)   Discharged Condition: stable  Significant Diagnostic Studies: No results found.  Labs:  Basic Metabolic Panel:    Latest Ref Rng & Units 08/12/2023    6:09 AM 08/08/2023    5:11 AM 08/05/2023    5:33 AM  BMP  Glucose 70 - 99 mg/dL 253  664  403   BUN 8 - 23 mg/dL 13  17  17    Creatinine 0.61 - 1.24 mg/dL 4.74  2.59  5.63   Sodium 135 - 145 mmol/L 140  141  139   Potassium 3.5 - 5.1 mmol/L 3.7  3.7  3.8   Chloride 98 - 111 mmol/L 107  110  106   CO2 22 - 32 mmol/L 23  23  23    Calcium  8.9 - 10.3 mg/dL 8.5  8.4  8.4      CBC:    Latest Ref Rng & Units 08/12/2023    6:09 AM 08/08/2023    5:11 AM 08/05/2023    5:33 AM  CBC  WBC 4.0 - 10.5 K/uL 7.7  7.5  6.1   Hemoglobin 13.0 - 17.0 g/dL 87.5  64.3  32.9   Hematocrit 39.0 - 52.0 % 33.9  33.0  35.2   Platelets 150 - 400 K/uL 336  339  365      CBG: Recent Labs  Lab 08/12/23 0640 08/12/23 1150 08/12/23 1646 08/12/23 2109 08/13/23 0607  GLUCAP 150* 130* 152* 135* 128*    Brief HPI:   [name]. is a 73 y.o. male with history of T2DM, HTN, [name], renal calculi, seropositive myasthenia gravis who was admitted to [facility] on 07/14/2023 with reports 3 to 4-day history of stuttering speech and fluctuating LUE weakness followed by fall.  He reported some double vision and per discussion with his neurology I had increased prednisone  to 10 mg/day due to concerns of MG flare.  He was found to have left facial weakness with dysarthria as well as LUE and LLE weakness.  MRI brain done revealing acute right MCA infarction involving right  periventricular white matter and extending into right frontal and parietal lobes.    BLE Dopplers done were negative for DVT but limited visualization of posterior tib and peroneal veins reported.  Stroke felt to be cryptogenic in nature and 30-day event monitor was recommended.  He was maintained on low-dose aspirin  and prednisone  was weaned back to 2 mg a day.  Therapy was working with patient who was requiring min to mod assist with ADLs and mobility.  He was independent and working part-time prior to admission.  CIR was recommended due to functional decline.   Hospital Course: [name]. was admitted to rehab 07/19/2023 for inpatient therapies to consist of PT, ST and OT at least three hours five days a week. Past admission physiatrist, therapy team and rehab RN have worked together to provide customized collaborative inpatient rehab.  He continues on low-dose aspirin  and subcu Lovenox  was used for DVT prophylaxis during his stay.  His blood pressures were monitored on TID basis and has been stable on current regimen.  Serial check of CBC shows H&H to be relatively stable stool guaiac was negative x 1.  He continues to have lower extremity edema and was educated on importance of elevation for edema control.  Constipation has resolved with sorbitol  use on intermittent basis.  P.o. intake has been good.  He was noted to have Candida crura's which was treated with course of Diflucan.  MASD has resolved with use of Gerhardt's Butt cream.  Left hemiplegia as been a limiting factor with left knee pain noted with increase in activity.  EKG was added for support and pain management.  His diabetes has been monitored with ac/hs CBG checks and SSI was use prn for tighter BS control.  Metformin  was resumed at 5 mg twice daily and basal insulin  was slowly titrated upwards to 26 units.  Blood sugars continue to be variable and he was advised to increase metformin  to home dose and titrate insulin  glargine  slowly if blood sugars range over 150.  He has made steady gains during his stay and continues to be limited by mild left inattention with left knee plegia and weakness. He has been evaluated for  speciality wheelchair by [employer]. He will continue to receive follow up outpatient PT, OT and ST at [facility] after discharge.    Rehab course: During patient's stay in rehab weekly team conferences were held to monitor patient's progress, set goals and discuss barriers to discharge. At admission, patient required assist with ADL task and fatigue.  He exhibited functional swallow function and mild cognitive deficits including delay in recall and decreased attention. He  has had improvement in activity tolerance, balance, postural control as well as ability to compensate for deficits.  He is able to complete upper body dressing with supervision with increased time and lower body ADLs with mod assist.  He requires mod assist for toileting tasks.  He requires min assist for sit to stand transfers and is able to ambulate 15 feet with mod assist as well as verbal and tactile cues for weight shifting and facilitation.  He requires supervision vision with cognitive tasks due to left inattention and min assist for complex problem-solving. Family education has been completed.   Discharge disposition: 01-Home or Self Care  Diet: Heart Healthy/Carb Modified  Special Instructions: Resume home CGM and monitor BS ac/hs.    Allergies as of 08/13/2023       Reactions   Latex Other (See Comments)   Skin irritant   Omnicef [cefdinir]    C. Diff   Penicillins Rash   Large doses        Medication List     STOP taking these medications    B-12 PO   ibuprofen 200 MG tablet Commonly known as: ADVIL   pravastatin 80 MG tablet Commonly known as: PRAVACHOL       TAKE these medications    aspirin  81 MG tablet Take 81 mg by mouth daily.   atorvastatin  80 MG tablet Commonly known as:  LIPITOR  Take 1 tablet (80 mg total) by mouth daily with supper.   Cyanocobalamin 1000 MCG/ML Kit Inject 1 Dose as directed every 30 (thirty) days.   insulin  degludec 100 UNIT/ML FlexTouch Pen Commonly known as: TRESIBA  Inject 28 Units into the skin daily. What changed: how much to take   levothyroxine  150 MCG tablet Commonly known as: SYNTHROID  Take 150 mcg by mouth daily before breakfast.   losartan  50 MG tablet Commonly known as: COZAAR  Take 50 mg by mouth  daily.   melatonin 5 MG Tabs Take 1 tablet (5 mg total) by mouth at bedtime as needed.   metFORMIN  1000 MG tablet Commonly known as: GLUCOPHAGE  Take 1,000 mg by mouth 2 (two) times daily with a meal.   mycophenolate  250 MG capsule Commonly known as: CELLCEPT  Take 250 mg by mouth at bedtime. Along with 2, 500mg  tablets for a total nightly dose of 1250mg .   mycophenolate  500 MG tablet Commonly known as: CELLCEPT  Take 1,000-1,500 mg by mouth See admin instructions. Take 1500mg  (3 tablets) by mouth every morning and 1000mg  (2 tablets) at bedtime along with a 250mg  capsule for a total nightly dose of 1250mg .   polyethylene glycol powder 17 GM/SCOOP powder Commonly known as: GLYCOLAX /MIRALAX  Take 17 g by mouth daily.   predniSONE  1 MG tablet Commonly known as: DELTASONE  Take 2 mg by mouth daily.   sodium bicarbonate  650 MG tablet Take 1,950 tablets by mouth 2 (two) times daily.        Follow-up Information     Kirsteins, Cecilia Coe, MD Follow up.   Specialty: Physical Medicine and Rehabilitation Why: office will call you with follow up appointment Contact information: [address] [place] [address] [phone]         Annabell Key, DO Follow up.   Specialty: Neurology Why: Call in 1-2 days for post hospital follow up Contact information: [address] [place] [address] [phone]         Little Riff, MD Follow up.   Specialty: Internal Medicine Why: Call  in 1-2 days for post hospital follow up Contact information: [address] [facility] [place] [address] [phone]                 Signed: Zelda Hickman 08/13/2023, 6:19 PM

## 2023-08-13 NOTE — Progress Notes (Signed)
 PROGRESS NOTE   Subjective/Complaints:  Pt in good spirits. Wife at bedside. They're packed and ready to go.   ROS: Patient denies fever, rash, sore throat, blurred vision, dizziness, nausea, vomiting, diarrhea, cough, shortness of breath or chest pain, joint or back/neck pain, headache, or mood change.     Objective:   No results found. Recent Labs    08/12/23 0609  WBC 7.7  HGB 10.8*  HCT 33.9*  PLT 336        Recent Labs    08/12/23 0609  NA 140  K 3.7  CL 107  CO2 23  GLUCOSE 133*  BUN 13  CREATININE 1.15  CALCIUM  8.5*         Intake/Output Summary (Last 24 hours) at 08/13/2023 0852 Last data filed at 08/13/2023 0810 Gross per 24 hour  Intake 1036 ml  Output 400 ml  Net 636 ml        Physical Exam: Vital Signs Blood pressure 125/82, pulse (!) 57, temperature 98 F (36.7 C), resp. rate 16, weight 127.7 kg, SpO2 98%.  Constitutional: No distress . Vital signs reviewed. HEENT: NCAT, EOMI, oral membranes moist Neck: supple Cardiovascular: RRR without murmur. No JVD    Respiratory/Chest: CTA Bilaterally without wheezes or rales. Normal effort    GI/Abdomen: BS +, non-tender, non-distended Ext: no clubbing, cyanosis, or edema Psych: pleasant and cooperative  Skin: healing scabs on left side, mepilex in place Neurologic: Cranial nerves II through XII intact, motor strength is 5/5 in right and 3-/5 left deltoid, bicep, tricep, WE tr,  0/5 FE and  2+ grip, 3 to 3+ hip flexor, knee extensors, ankle dorsiflexor and plantar flexor. Flexor tone in left fingers/wrist is trace Cerebellar exam remains limited by weakness on the left side  Prior neuro assessment is c/w today's exam 08/13/2023.  Musculoskeletal: Full passive  range of motion in all 4 extremities. No joint swelling     Assessment/Plan: 1. Functional deficits which require 3+ hours per day of interdisciplinary therapy in a comprehensive  inpatient rehab setting. Physiatrist is providing close team supervision and 24 hour management of active medical problems listed below. Physiatrist and rehab team continue to assess barriers to discharge/monitor patient progress toward functional and medical goals  Care Tool:  Bathing    Body parts bathed by patient: Left arm, Chest, Abdomen, Right upper leg, Left upper leg, Face, Right arm, Front perineal area, Right lower leg, Left lower leg (used long sponge to reach R arm)   Body parts bathed by helper: Buttocks     Bathing assist Assist Level: Minimal Assistance - Patient > 75%     Upper Body Dressing/Undressing Upper body dressing   What is the patient wearing?: Pull over shirt    Upper body assist Assist Level: Supervision/Verbal cueing    Lower Body Dressing/Undressing Lower body dressing      What is the patient wearing?: Pants     Lower body assist Assist for lower body dressing: Moderate Assistance - Patient 50 - 74%     Toileting Toileting    Toileting assist Assist for toileting: Moderate Assistance - Patient 50 - 74%     Transfers Chair/bed transfer  Transfers assist     Chair/bed transfer assist level: Minimal Assistance - Patient > 75%     Locomotion Ambulation   Ambulation assist   Ambulation activity did not occur: Safety/medical concerns (ambulated 41' with RHR in hallway mod assist and +2 WC follow)  Assist level: Moderate Assistance - Patient 50 - 74% Assistive device: Walker-rolling (L swedish knee cage) Max distance: 18   Walk 10 feet activity   Assist  Walk 10 feet activity did not occur: Safety/medical concerns  Assist level: Moderate Assistance - Patient - 50 - 74% Assistive device: Walker-rolling (L swedish knee cage)   Walk 50 feet activity   Assist Walk 50 feet with 2 turns activity did not occur: Safety/medical concerns (decreased endurance to do so safely)         Walk 150 feet activity   Assist Walk 150  feet activity did not occur: Safety/medical concerns (decreased endurance to do so safely)         Walk 10 feet on uneven surface  activity   Assist Walk 10 feet on uneven surfaces activity did not occur: Safety/medical concerns (decreased endurance to do so safely)         Wheelchair     Assist Is the patient using a wheelchair?: Yes Type of Wheelchair: Power    Wheelchair assist level: Supervision/Verbal cueing Max wheelchair distance: 500'+    Wheelchair 50 feet with 2 turns activity    Assist        Assist Level: Supervision/Verbal cueing   Wheelchair 150 feet activity     Assist      Assist Level: Supervision/Verbal cueing   Blood pressure 125/82, pulse (!) 57, temperature 98 F (36.7 C), resp. rate 16, weight 127.7 kg, SpO2 98%.  Medical Problem List and Plan: 1. 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 Goals met. Pt with outpt therapies at [facility]--PT, OT, SLP. F/u with neurology ([facility] and [facility]) as well as Dr Sharl Davies and PCP  -Knee cage beneficial LLE. May not need soon!  2.  Antithrombotics: -DVT/anticoagulation:  Pharmaceutical: Lovenox              -antiplatelet therapy: ASA 3. Pain Management: Tylenol  prn.  4. Mood/Behavior/Sleep: LCSW to follow for evaluation and support.              -antipsychotic agents: N/A 5. Neuropsych/cognition: This patient is capable of making decisions on his own behalf. 6. Skin/Wound Care:               --Gerhardt's butt cream to MASD on buttocks             --Diflucan for candida cruris.              --continue pressure relief measures to left elbow/left heel.   -abrasions healing along LUE, LLE healing nicely  7. Fluids/Electrolytes/Nutrition:  encourage appropriate PO    Latest Ref Rng & Units 08/12/2023    6:09 AM 08/08/2023    5:11 AM 08/05/2023    5:33 AM  BMP  Glucose 70 - 99 mg/dL 161  096  045   BUN 8 - 23 mg/dL 13  17  17    Creatinine 0.61 - 1.24 mg/dL 4.09   8.11  9.14   Sodium 135 - 145 mmol/L 140  141  139   Potassium 3.5 - 5.1 mmol/L 3.7  3.7  3.8   Chloride 98 -  111 mmol/L 107  110  106   CO2 22 - 32 mmol/L 23  23  23    Calcium  8.9 - 10.3 mg/dL 8.5  8.4  8.4      6/57 recent labs in range 8. T2DM: Hgb A1C- 8.2 (PTA) Was taking Tresiba  54 units in am ( now on Semglee  22U ) and  Metformin  1000 mg--bid (restart Metformin  at 500mg  BID)   CBG (last 3)  Recent Labs    08/12/23 1646 08/12/23 2109 08/13/23 0607  GLUCAP 152* 135* 128*   Metformin  500mg  BID  novolog  to 6U TID Am CBG on low side will reduce semglee  to 28U 5/20- well controlled--continue regimen 9.   HTN: Monitor BP TID--losartan  50 mg daily--was held for a few days to prevent hypoperfusion             -- parameters to hold prn sitting SBP< 120.   5/17-19 stable, continue current regimen and monitor Vitals:   08/12/23 2045 08/13/23 0515  BP: 129/71 125/82  Pulse: 62 (!) 57  Resp: 17 16  Temp: 98.6 F (37 C) 98 F (36.7 C)  SpO2: 98% 98%    10.  Myasthenia Gravis Mycophenolate  1500/1250mg  --Dr. Franklin Ito at [facility].              Prednisone  5 mg PTA--->has been weaned down to 2 mg daily, monitor for Ptosis and generalized proximal weakness Asymptomatic for MG 5/16- would not use Botulinum toxin unless cleared by his neurologist, phenol motor point injection a possibility  5/17-19 has periodic sx but appear stable 11. Hypothyroid: Stable on levothyroxine  for supplement.  12.  Dyslipidemia: Was on pravastatin 80 mg daily 13. H/o Kidney stones: On Sodium bicarb BID to prevent stones.  14. Constipation: On miralax  daily             --sorbitol  added.   -last bm 5/1--> give sorbitol  today 5/4  5/10- LBM 5/8-   5/11- LBM 3 days ago- will order Sorbitol  this afternoon if doesn't go  LBM 5/17-continue to monitor bowel function 15. H/o GERD: Has been off PPI.  16.  Mild anemia      Latest Ref Rng & Units 08/12/2023    6:09 AM 08/08/2023    5:11 AM 08/05/2023    5:33 AM  CBC   WBC 4.0 - 10.5 K/uL 7.7  7.5  6.1   Hemoglobin 13.0 - 17.0 g/dL 84.6  96.2  95.2   Hematocrit 39.0 - 52.0 % 33.9  33.0  35.2   Platelets 150 - 400 K/uL 336  339  365   5/19 stool ob negative, hgb sl up today---no gross loss 17.  Lower extremity edema  - chronic  -5/18-20 improved, continue to monitor    -will need to be observant especially for LLE  LOS: 25 days A FACE TO FACE EVALUATION WAS PERFORMED  Rawland Caddy 08/13/2023, 8:52 AM

## 2023-08-13 NOTE — Progress Notes (Signed)
 Patient and spouse anticipates discharge home today, sleep chart continue 9.5/12 hrs recorded, no acute distress.

## 2023-08-13 NOTE — Plan of Care (Signed)
  Problem: RH Wheelchair Mobility Goal: LTG Patient will propel w/c in controlled environment (PT) Description: LTG: Patient will propel wheelchair in controlled environment, # of feet with assist (PT) 08/13/2023 0336 by Donne Gage, PT Outcome: Adequate for Discharge 08/13/2023 0327 by Donne Gage, PT Outcome: Adequate for Discharge Flowsheets Taken 08/12/2023 1727 by Donne Gage, PT LTG: Pt will propel w/c in controlled environ  assist needed:: Supervision/Verbal cueing Taken 07/20/2023 1544 by Annia Kilts, PT LTG: Propel w/c distance in controlled environment: 150' Goal: LTG Patient will propel w/c in home environment (PT) Description: LTG: Patient will propel wheelchair in home environment, # of feet with assistance (PT). 08/13/2023 0336 by Donne Gage, PT Outcome: Adequate for Discharge 08/13/2023 0336 by Donne Gage, PT Reactivated 08/13/2023 0327 by Donne Gage, PT Outcome: Completed/Met Flowsheets Taken 08/12/2023 1727 by Donne Gage, PT LTG: Pt will propel w/c in home environ  assist needed:: Supervision/Verbal cueing Taken 07/20/2023 1544 by Annia Kilts, PT LTG: Propel w/c distance in home environment: 50'   Problem: RH Balance Goal: LTG Patient will maintain dynamic sitting balance (PT) Description: LTG:  Patient will maintain dynamic sitting balance with assistance during mobility activities (PT) Outcome: Completed/Met Flowsheets (Taken 07/20/2023 1544 by Annia Kilts, PT) LTG: Pt will maintain dynamic sitting balance during mobility activities with:: Supervision/Verbal cueing Goal: LTG Patient will maintain dynamic standing balance (PT) Description: LTG:  Patient will maintain dynamic standing balance with assistance during mobility activities (PT) Outcome: Completed/Met Flowsheets (Taken 08/12/2023 1727) LTG: Pt will maintain dynamic standing balance during mobility activities with:: Supervision/Verbal cueing   Problem: Sit to Stand Goal: LTG:   Patient will perform sit to stand with assistance level (PT) Description: LTG:  Patient will perform sit to stand with assistance level (PT) Outcome: Completed/Met Flowsheets (Taken 08/09/2023 0601) LTG: PT will perform sit to stand in preparation for functional mobility with assistance level: Minimal Assistance - Patient > 75%   Problem: RH Bed Mobility Goal: LTG Patient will perform bed mobility with assist (PT) Description: LTG: Patient will perform bed mobility with assistance, with/without cues (PT). Outcome: Completed/Met Flowsheets (Taken 08/09/2023 0601) LTG: Pt will perform bed mobility with assistance level of: Minimal Assistance - Patient > 75% Note: Sit<>supine = MinA Roll = MaxA to R side   Problem: RH Bed to Chair Transfers Goal: LTG Patient will perform bed/chair transfers w/assist (PT) Description: LTG: Patient will perform bed to chair transfers with assistance (PT). Outcome: Completed/Met Flowsheets (Taken 08/09/2023 0601) LTG: Pt will perform Bed to Chair Transfers with assistance level: Minimal Assistance - Patient > 75%   Problem: RH Car Transfers Goal: LTG Patient will perform car transfers with assist (PT) Description: LTG: Patient will perform car transfers with assistance (PT). Outcome: Completed/Met Flowsheets (Taken 07/20/2023 1544 by Annia Kilts, PT) LTG: Pt will perform car transfers with assist:: Minimal Assistance - Patient > 75%

## 2023-08-20 ENCOUNTER — Ambulatory Visit: Admitting: Occupational Therapy

## 2023-08-20 ENCOUNTER — Ambulatory Visit: Attending: Physician Assistant | Admitting: Physical Therapy

## 2023-08-20 ENCOUNTER — Ambulatory Visit: Admitting: Speech Pathology

## 2023-08-20 DIAGNOSIS — M6281 Muscle weakness (generalized): Secondary | ICD-10-CM | POA: Diagnosis present

## 2023-08-20 DIAGNOSIS — R41841 Cognitive communication deficit: Secondary | ICD-10-CM

## 2023-08-20 DIAGNOSIS — R269 Unspecified abnormalities of gait and mobility: Secondary | ICD-10-CM | POA: Diagnosis present

## 2023-08-20 DIAGNOSIS — R278 Other lack of coordination: Secondary | ICD-10-CM | POA: Insufficient documentation

## 2023-08-20 DIAGNOSIS — H539 Unspecified visual disturbance: Secondary | ICD-10-CM | POA: Diagnosis present

## 2023-08-20 DIAGNOSIS — I69354 Hemiplegia and hemiparesis following cerebral infarction affecting left non-dominant side: Secondary | ICD-10-CM | POA: Diagnosis present

## 2023-08-20 NOTE — Therapy (Unsigned)
 OUTPATIENT SPEECH LANGUAGE PATHOLOGY  COGNITION EVALUATION   Patient Name: [name]. MRN: [id number] DOB:[birth year], 73 y.o., male Today's Date: 08/20/2023  PCP: Glenora Laos, MD REFERRING PROVIDER: Georjean Kite, PA-C   End of Session - 08/20/23 1448     Visit Number 1    Number of Visits 25    Date for SLP Re-Evaluation 11/12/23    Authorization Type [employer]    Progress Note Due on Visit 10    SLP Start Time 1530    SLP Stop Time  1615    SLP Time Calculation (min) 45 min    Activity Tolerance Patient tolerated treatment well             Past Medical History:  Diagnosis Date   Anemia    Cervical myelopathy (HCC)    Chronic venous insufficiency    [name] (chronic kidney disease), stage III (HCC)    Diabetes mellitus without complication (HCC)    GERD (gastroesophageal reflux disease)    Hypercholesteremia    Hypothyroidism    Kidney stones    about 50 in the past--last in March 4/24   Leg weakness, bilateral    due to Myasthenia Gravis   Myasthenia gravis (HCC)    Spinal stenosis    Vitamin B 12 deficiency    Vitamin B 12 deficiency    Wears hearing aid    bilateral   Past Surgical History:  Procedure Laterality Date   BACK SURGERY  09/2015   CARDIAC CATHETERIZATION     CATARACT EXTRACTION W/ INTRAOCULAR LENS IMPLANT     CATARACT EXTRACTION W/PHACO Right 12/21/2015   Procedure: CATARACT EXTRACTION PHACO AND INTRAOCULAR LENS PLACEMENT (IOC);  Surgeon: Annell Kidney, MD;  Location: [facility];  Service: Ophthalmology;  Laterality: Right;  DIABETIC - insulin  and oral meds   COLONOSCOPY     SHOULDER SURGERY     labrum repair   TONSILLECTOMY     Patient Active Problem List   Diagnosis Date Noted   High blood pressure 08/13/2023   Anemia 08/13/2023   Constipation 08/13/2023   Myasthenia gravis (HCC) 08/13/2023   Acute ischemic right MCA stroke (HCC) 07/19/2023   Lymphedema 12/27/2016   Leg pain 07/15/2016    Chronic venous insufficiency 07/15/2016   Swelling of limb 07/15/2016   Type 2 diabetes mellitus (HCC) 07/15/2016   Hyperlipidemia 07/15/2016    ONSET DATE: 07/19/2023   REFERRING DIAG: N82.956 (ICD-10-CM) - Cerebral infarction due to unspecified occlusion or stenosis of right middle cerebral artery  THERAPY DIAG:  Cognitive communication deficit  Rationale for Evaluation and Treatment Rehabilitation  SUBJECTIVE:   SUBJECTIVE STATEMENT: Pt arrived after PT/OT evaluations, accompanied by his wife Pt accompanied by: significant other  PERTINENT HISTORY and DIAGNOSTIC FINDINGS:   [name] is a 73 y.o. 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  CT Brain w/o contrast: 07/14/2023 New hypodensity within the right periventricular white matter extending into the inferior frontal lobe/operculum compatible with a recent infarct.   MRI Brain w/o contrast: 07/15/2023 IMPRESSION:  Acute right MCA distribution infarct involving the right periventricular white matter extending into the right frontal and parietal lobes and insula , some of which correlates to the recent CT finding. No significant associated mass effect or associated hemorrhage.   MRA head angiogram 07/16/2023 IMPRESSION:  Focal occlusion of the right  inferior division distal M2 MCA (6:123) in keeping with known right MCA infarct when compared to prior MRI brain 07/15/2023.   CT head 07/17/2023 IMPRESSION:  Expansion of territory involved in the right MCA infarct compared with MRI of 07/15/2023. Increasing local mass effect with new leftward midline shift of 3 mm. No hemorrhage.   Speech Language Evaluation at [facility] 07/19/2023 Patient presents with cognitive deficits in the areas of problem solving and left inattention as  evidenced throughout patient/family interview and performance on Cognistat evaluation. Patient further demonstrates impairments in the areas of intellectual, anticipatory, and emergent awareness of deficits/mistakes.   Speech Language Evaluation at [facility] 07/31/2023 Skilled therapy session focused on re-evaluation of cognitive skills utilizing the CLQT standardized assessment. Patient scored with overall mild cognitive deficits in the subtests of attention, executive functioning and visual spatial skills. Patient scored WFL on the clock drawing task, language and memory.   PAIN:  Are you having pain? No   FALLS: Has patient fallen in last 6 months?  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   PATIENT GOALS   to assess and improve cognitive communication abilities following Right MCA CVA  OBJECTIVE:   COGNITIVE COMMUNICATION Overall cognitive status: Impaired Areas of impairment:  Attention: Impaired: Divided Memory: Impaired: Working Counselling psychologist Awareness: Impaired: Intellectual, Emergent, Anticipatory, Error awareness: 50 %, and Neglect Executive function: Impaired: Problem solving, Organization, Planning, Error awareness, and Self-correction Behavior: Lability Impaired Functional Impairments: Pt unable to recall passwords/usernames, decreased awareness of deficits, visuospatial deficits when using the computer, left inattention  AUDITORY COMPREHENSION  Overall auditory comprehension: Appears intact YES/NO questions: Appears intact Following directions: Appears intact Conversation: Simple Interfering components: working Research scientist (life sciences): repetition/stressing words  READING COMPREHENSION: difficulty d/t left  inattention  EXPRESSION: verbal  VERBAL EXPRESSION:   Overall verbal expression: Appears intact Level of generative/spontaneous verbalization: conversation Automatic speech: name: intact and social response: intact  Repetition: Appears intact Naming: Responsive: 76-100%, Confrontation: 76-100%, Convergent: 76-100%, and Divergent: 0-25% Pragmatics: Appears intact Non-verbal means of communication: N/A  WRITTEN EXPRESSION: Dominant hand: right Written expression: Appears intact  ORAL MOTOR EXAMINATION Facial : Symmetry impaired: Impaired left, Strength impaired: Impaired left, Sensation impaired: Impaired left Lingual: WFL Velum: WFL Mandible: WFL Cough: WFL Voice: WFL  MOTOR SPEECH: Overall motor speech: Appears intact Respiration: diaphragmatic/abdominal breathing Phonation: normal Resonance: WFL Articulation: Appears intact Intelligibility: Intelligible Motor planning: Appears intact   STANDARDIZED ASSESSMENTS:  The Repeatable Battery for the Assessment of Neuropsychological Status (RBANS) is a brief, individually administered test measuring attention, language, visuospatial/constructional abilities, and immediate and delayed memory. The test comprises 12 subtests that are intended for use with adolescents to adults, ages 65 to 66 years. Normative scores were developed using a stratified, nationally representative sample of 690 healthy adolescents and adults. RBANS yields index standard scores based on subtest raw scores. RBANS index scores are metrically scaled, with a mean of 100 and a standard deviation of 15 for each age group.  Immediate Memory Index Score: 94: Average (Index Score 90-109)  Total Score by Domain: List Learning 22/40 Story Memory 19/24  Visuospatial/Constructional Index Score: 64: Borderline (Index Score 70-79)  Total Score by Domain: Figure Copy 10/20  Line Orientation 13/20  Language Index Score: 85: Low Average (Index Score 80-89)  Total  Score by Domain Picture Naming  10/10  Semantic Fluency 11/40  Attention Index Score: 94: Average (Index Score 90-109)  Total Score by Domain: Digit Span 8/16: Scaled Score 7  Coding 78/89: Scaled Score 3  Delayed Memory Index Score: 68: Extremely Low (Index Score 69 and below)  Total Score by Domain: List Recall 2/10  List Recognition 17/20 Story Recall 7/12 Figure Recall 3/20  Total Scale: 76: Percentile Rank: 5%   PATIENT REPORTED OUTCOME MEASURES (PROM): To be completed over the next 3 sessions   TODAY'S TREATMENT:  Skilled information provided on visuospatial deficits and potential impact on functional tasks such as using computer and clicking on appropriate boxes (username/password). Pt displayed decreased awareness of these deficits as he reported no issues with this but his wife reported that pt required significant support to locate appropriate boxes to enter information.     PATIENT EDUCATION: Education details: results of this evaluation, ST POC Person educated: Patient and Spouse Education method: Explanation Education comprehension: needs further education   HOME EXERCISE PROGRAM:   N/A   GOALS:  Goals reviewed with patient? Yes  SHORT TERM GOALS: Target date: 10 sessions  The patient will complete a simple word search puzzle (66 or smaller) within 10 minutes given intermittent moderate verbal cues. Baseline: Goal status: INITIAL  2.  The patient will recall sentence-level information after a 5-minute delay using the spaced retrieval technique. Baseline:  Goal status: INITIAL  3.  With moderate cues, pt will identify 3 cognitive strengths/weaknesses during tasks.  Baseline:  Goal status: INITIAL  LONG TERM GOALS: Target date: 11/12/2023  The patient will read sentence-level information aloud at 80% accuracy given intermittent minimal verbal cues in order to increase ability to safely live independently. Baseline:  Goal status: INITIAL  2.    With Min A, patient will use external aid for functional recall of completed/upcoming activities 75% accuracy.     Baseline:  Goal status: INITIAL  3.   With Min A, patient will complete semi-complex visual memory tasks with 75% accuracy independently.   Baseline:  Goal status: INITIAL   ASSESSMENT:  CLINICAL IMPRESSION: Patient is a 73 y.o. right handed male who was seen today for a cognitive communication evaluation d/t right MCA CVA. Pt presents with overall  moderate cognitive impairment with severe deficits in delayed memory, severe deficits in visuospatial/constructional abilities (apart from left inattention), decreased awareness of deficits/errors and mild deficits in higher level attention. Immediate memory, language and selective attention are relative strengths for pt.   OBJECTIVE IMPAIRMENTS include attention, memory, awareness, and executive functioning. These impairments are limiting patient from return to work, managing medications, managing appointments, managing finances, household responsibilities, and ADLs/IADLs. Factors affecting potential to achieve goals and functional outcome are severity of impairments. Patient will benefit from skilled SLP services to address above impairments and improve overall function.  REHAB POTENTIAL: Good  PLAN: SLP FREQUENCY: 1-2x/week  SLP DURATION: 12 weeks  PLANNED INTERVENTIONS: Cognitive reorganization, Internal/external aids, Functional tasks, SLP instruction and feedback, Compensatory strategies, and Patient/family education   Daily Doe B. Garlin Junker, M.S., CCC-SLP, Tree surgeon Certified Brain Injury Specialist [facility]  [facility] Rehabilitation Services Office [phone] Ascom [phone] Fax [phone]

## 2023-08-20 NOTE — Therapy (Addendum)
 OUTPATIENT OCCUPATIONAL THERAPY NEURO EVALUATION  Patient Name: [name]. MRN: [id number] DOB:[birth year], 73 y.o., male Today's Date: 08/20/2023  PCP: Arvel Birch, MD REFERRING PROVIDER: Leotha Rang, PA-C  END OF SESSION:  OT End of Session - 08/20/23 1537     Visit Number 1    Number of Visits 24    Date for OT Re-Evaluation 11/12/23    OT Start Time 1450    OT Stop Time 1530    OT Time Calculation (min) 40 min             Past Medical History:  Diagnosis Date   Anemia    Cervical myelopathy (HCC)    Chronic venous insufficiency    [name] (chronic kidney disease), stage III (HCC)    Diabetes mellitus without complication (HCC)    GERD (gastroesophageal reflux disease)    Hypercholesteremia    Hypothyroidism    Kidney stones    about 50 in the past--last in March 4/24   Leg weakness, bilateral    due to Myasthenia Gravis   Myasthenia gravis (HCC)    Spinal stenosis    Vitamin B 12 deficiency    Vitamin B 12 deficiency    Wears hearing aid    bilateral   Past Surgical History:  Procedure Laterality Date   BACK SURGERY  09/2015   CARDIAC CATHETERIZATION     CATARACT EXTRACTION W/ INTRAOCULAR LENS IMPLANT     CATARACT EXTRACTION W/PHACO Right 12/21/2015   Procedure: CATARACT EXTRACTION PHACO AND INTRAOCULAR LENS PLACEMENT (IOC);  Surgeon: Annell Kidney, MD;  Location: [facility];  Service: Ophthalmology;  Laterality: Right;  DIABETIC - insulin  and oral meds   COLONOSCOPY     SHOULDER SURGERY     labrum repair   TONSILLECTOMY     Patient Active Problem List   Diagnosis Date Noted   High blood pressure 08/13/2023   Anemia 08/13/2023   Constipation 08/13/2023   Myasthenia gravis (HCC) 08/13/2023   Acute ischemic right MCA stroke (HCC) 07/19/2023   Lymphedema 12/27/2016   Leg pain 07/15/2016   Chronic venous insufficiency 07/15/2016   Swelling of limb 07/15/2016   Type 2 diabetes mellitus (HCC) 07/15/2016    Hyperlipidemia 07/15/2016    ONSET DATE: 07/13/2023  REFERRING DIAG: Acute ischemic R MCA CVA  THERAPY DIAG:    Rationale for Evaluation and Treatment: Rehabilitation  SUBJECTIVE:   SUBJECTIVE STATEMENT:  Pt. Reports 2/10 pain in the Left Forearm digit flexors during 3rd and 5th digit MP extension. Pt accompanied by: significant other  PERTINENT HISTORY: Pt. Was admitted to [facility] on 07/13/2023 after sustaining a CVA while on a trip to the beach. Pt. Was diagnosed with R MCA CVA. Pt. Was admitted to inpatient rehab from 07/19/2023-08/13/2023. Past medical history includes high BP, anemia, and Myasthenia Gravis.   PRECAUTIONS: None  WEIGHT BEARING RESTRICTIONS: No  PAIN:  Are you having pain? No pain initially.  Pt. Reports 2/10 pain in the left forearm digit flexors during 3rd and 5th digit MP extension.  FALLS: Has patient fallen in last 6 months? Yes  LIVING ENVIRONMENT: Lives with: lives with their family and lives with their spouse-  Lives in: House/apartment- 2 story home and resides mostly on the 1st floor Stairs: No- Ramped entrance Has following equipment at home:  Otho Blitz, cane, Steadi, shower chair, handheld shower head, bedside commode  PLOF: Independent and Independent with basic ADLs Independent at home   PATIENT GOALS: Would like to walk 10-20 feet  each time.   OBJECTIVE:  Note: Objective measures were completed at Evaluation unless otherwise noted.  HAND DOMINANCE: Right  ADLs: Overall ADLs:  Transfers/ambulation related to ADLs: Eating: Independent, 100% R handed Grooming: independent UB Dressing: Can put on shirt independently LB Dressing: Difficulty with pants and requires assistance getting the pants on his feet, has difficulty putting on shoes and socks Toileting: Tot A for toilet hygiene. Uses Steadi during toilet transfers. Bathing: Requires assist with using the L arm to wash the R arm. Tub Shower transfers: Roll in shower, built in shower  chair, grab bars and hand held shower head.  IADLs: Shopping: Total A (Wife typically did the shopping prior to onset)   Light housekeeping: Total A (wife typically performs prior to onset) Meal Prep: Independent with light snack prep Community mobility: No driving, able to get in/out of the car Medication management: Set up assistance from his wife using a pill box (pt. Is responsible for taking medications at the correct time) Financial management: family members check behind him. Handwriting: TBD Hobbies: gardening, wood working and sports- [employer] Work history: Owns a tax business MOBILITY STATUS: Needs Assist:    POSTURE COMMENTS:   Sitting balance: Good supported sitting balance   FUNCTIONAL OUTCOME MEASURES: TBD   UPPER EXTREMITY ROM:    Active ROM Right Eval  WFL Left eval  Shoulder flexion  89(110)  Shoulder abduction  98(120)  Shoulder adduction    Shoulder extension    Shoulder internal rotation    Shoulder external rotation    Elbow flexion  120(140)  Elbow extension  -28(-10)  Wrist flexion  60(60) Rest at 60  Wrist extension  -48(42)  Wrist ulnar deviation    Wrist radial deviation    Wrist pronation    Wrist supination    (Blank rows = not tested)  L Digit flexion: 2nd:3cm (0cm) 3rd: 0cm (0cm), 4th: 0cm (0cm), 5th: 2cm (0cm)  L Digit extension: Active Gross digit extension through 10% of ROM UPPER EXTREMITY MMT:     MMT Right eval Left eval  Shoulder flexion 5 3-  Shoulder abduction 5 3-  Shoulder adduction    Shoulder extension    Shoulder internal rotation    Shoulder external rotation    Middle trapezius    Lower trapezius    Elbow flexion 5 TBD  Elbow extension 5 TBD  Wrist flexion    Wrist extension  2-  Wrist ulnar deviation    Wrist radial deviation    Wrist pronation    Wrist supination    (Blank rows = not tested)  HAND FUNCTION:   COORDINATION:   SENSATION: Sensation feels different in both hands.  L hand Light  touch- impaired L hand Proprioception- impaired  EDEMA: Has had hx of edema, and came to Eval session with swelling in the L hand/wrist/forearm.   *pt. Has a laceration from his dog on the dorsal aspect of the L hand.   MUSCLE TONE: Increased tone through the UE and hand flexors.   COGNITION: Overall cognitive status: Within functional limits for tasks assessed  VISION: Subjective report: L sided inattention Baseline vision: Wears glasses for reading only Visual history: Right visual occlusion  VISION ASSESSMENT: To be assessed   PERCEPTION: TBD  PRAXIS: Impaired  TREATMENT DATE: 08/20/2023 Initial evaluation completed and patient education was provided as indicated below.          PATIENT EDUCATION: Education details: OT services plan of care goals and pt. LUE and functional status. Person educated: Patient and Spouse Education method: Explanation, Demonstration, Tactile cues, and Verbal cues Education comprehension: verbalized understanding, returned demonstration, and needs further education  HOME EXERCISE PROGRAM: Continue to assess and provide ongoing basis as indicated.    GOALS: Goals reviewed with patient? Yes  SHORT TERM GOALS: Target date: 10/01/2023   Pt. Will be independent with HEP for the LUE.  Baseline: Eval; no current HEP Goal status: INITIAL   LONG TERM GOALS: Target date: 11/12/2023  Pt. Will increase L shoulder flexion by 10 degrees to be able to reach up to shelves.  Baseline: Eval; L shoulder flexion is 89 (110). Goal status: INITIAL  2.  Pt. Will increase L shoulder abduction by 10 degrees to assist with underarm hygiene.  Baseline: Eval: L shoulder abduction is 98 (120).  Goal status: INITIAL  3.  Pt. Will improve L wrist extension by 10 degrees to be able to initiate anticipation of grasping for objects.   Baseline: Eval: -48 (42) Goal status: INITIAL  4.  Pt. Will improve active gross digit extension through 25% of the range to be able to release objects in his hand consistently.  Baseline: Eval: gross digit extension is 10% of the range  Goal status: INITIAL  5.  Pt. Will independently demonstrate visual compensatory strategies when navigating through environment and during tabletop tasks. Baseline: Pt. Requires consistent cuing for L sided awareness.  Goal status: INITIAL   ASSESSMENT:  CLINICAL IMPRESSION: Patient is a 73 y.o. male who was seen today for occupational therapy evaluation for CVA. Pt. Presents with LUE weakness, impaired ROM, decreased functional mobility, and impaired visual/perceptual functioning which limits his ability to efficiently complete ADL/IADL tasks including bathing, LE dressing, toileting and grooming skills. Pt. Is unable to engage his LUE efficiently during ADL/IADL tasks. Pt. Will benefit from OT services to work on improving LUE functioning to increase engagement of the LUE during daily task, and to provide education about compensatory strategies during ADLs/IADLs.  PERFORMANCE DEFICITS: in functional skills including ADLs, IADLs, coordination, dexterity, proprioception, sensation, edema, tone, ROM, strength, pain, Fine motor control, Gross motor control, mobility, balance, endurance, vision, and UE functional use, cognitive skills including perception, and psychosocial skills including coping strategies, environmental adaptation, and routines and behaviors.   IMPAIRMENTS: are limiting patient from ADLs, IADLs, and leisure.   CO-MORBIDITIES: may have co-morbidities  that affects occupational performance. Patient will benefit from skilled OT to address above impairments and improve overall function.  MODIFICATION OR ASSISTANCE TO COMPLETE EVALUATION: Min-Moderate modification of tasks or assist with assess necessary to complete an evaluation.  OT  OCCUPATIONAL PROFILE AND HISTORY: Detailed assessment: Review of records and additional review of physical, cognitive, psychosocial history related to current functional performance.  CLINICAL DECISION MAKING: Moderate - several treatment options, min-mod task modification necessary  REHAB POTENTIAL: Good  EVALUATION COMPLEXITY: Moderate    PLAN:  OT FREQUENCY: 2x/week  OT DURATION: 12 weeks  PLANNED INTERVENTIONS: 97168 OT Re-evaluation, 97535 self care/ADL training, 30865 therapeutic exercise, 97530 therapeutic activity, 97112 neuromuscular re-education, 97140 manual therapy, 97018 paraffin, 78469 moist heat, 97010 cryotherapy, 97034 contrast bath, 97760 Orthotic Initial, 97763 Orthotic/Prosthetic subsequent, passive range of motion, visual/perceptual remediation/compensation, energy conservation, and DME and/or AE instructions  RECOMMENDED OTHER SERVICES: PT and ST  CONSULTED AND  AGREED WITH PLAN OF CARE: Patient and family member/caregiver  PLAN FOR NEXT SESSION: Treatment   Debora Fallen, Student-OT 08/20/2023, 3:41 PM  This entire session was performed under direct supervision and direction of a licensed therapist/therapist assistant . I have personally read, edited and approve of the note as written.   Duey Ghent, MS, OTR/L  08/20/23

## 2023-08-20 NOTE — Therapy (Unsigned)
 OUTPATIENT PHYSICAL THERAPY NEURO EVALUATION   Patient Name: [name]. MRN: [id number] DOB:1950/12/28, 73 y.o., male Today's Date: 08/20/2023   PCP: Douglas Laos, MD  REFERRING PROVIDER:    Sterling Eisenmenger, PA-C    END OF SESSION:  PT End of Session - 08/20/23 1408     Visit Number 1    Number of Visits 24    Date for PT Re-Evaluation 11/12/23    PT Start Time 1405    PT Stop Time 1445    PT Time Calculation (min) 40 min    Equipment Utilized During Treatment Gait belt    Activity Tolerance Patient tolerated treatment well    Behavior During Therapy WFL for tasks assessed/performed             Past Medical History:  Diagnosis Date   Anemia    Cervical myelopathy (HCC)    Chronic venous insufficiency    [name] (chronic Wright disease), stage III (HCC)    Diabetes mellitus without complication (HCC)    GERD (gastroesophageal reflux disease)    Hypercholesteremia    Hypothyroidism    Wright stones    about 50 in the past--last in March 4/24   Leg weakness, bilateral    due to Myasthenia Gravis   Myasthenia gravis (HCC)    Spinal stenosis    Vitamin B 12 deficiency    Vitamin B 12 deficiency    Wears hearing aid    bilateral   Past Surgical History:  Procedure Laterality Date   BACK SURGERY  09/2015   CARDIAC CATHETERIZATION     CATARACT EXTRACTION W/ INTRAOCULAR LENS IMPLANT     CATARACT EXTRACTION W/PHACO Right 12/21/2015   Procedure: CATARACT EXTRACTION PHACO AND INTRAOCULAR LENS PLACEMENT (IOC);  Surgeon: Douglas Kidney, MD;  Location: [facility];  Service: Ophthalmology;  Laterality: Right;  DIABETIC - insulin  and oral meds   COLONOSCOPY     SHOULDER SURGERY     labrum repair   TONSILLECTOMY     Patient Active Problem List   Diagnosis Date Noted   High blood pressure 08/13/2023   Anemia 08/13/2023   Constipation 08/13/2023   Myasthenia gravis (HCC) 08/13/2023   Acute ischemic right MCA stroke (HCC) 07/19/2023    Lymphedema 12/27/2016   Leg pain 07/15/2016   Chronic venous insufficiency 07/15/2016   Swelling of limb 07/15/2016   Type 2 diabetes mellitus (HCC) 07/15/2016   Hyperlipidemia 07/15/2016    ONSET DATE:   REFERRING DIAG:  Diagnosis  I63.511 (ICD-10-CM) - Cerebral infarction due to unspecified occlusion or stenosis of right middle cerebral artery    THERAPY DIAG:  No diagnosis found.  Rationale for Evaluation and Treatment: Rehabilitation  SUBJECTIVE:  SUBJECTIVE STATEMENT: Pt reports that he is doing well. Has not been moving much since discharging from [facility], as therapists instructed pt to perform transfers only at home due to high fall risk with ambulation. Continues to have significant weakenss in the LUE with difficulty extending fingers as well as numbness and inattention to the L side of body resulting in multiple cuts on Arm and leg with WC mobility in home.  Will be attaining custom power WC from [employer]. Has loaner currently.   Pt accompanied by: significant other [name]   PERTINENT HISTORY: Douglas Vaeth. is a 73 y.o. male with history of T2DM, HTN, [name], renal calculi, seropositive myasthenia gravis who was admitted to [facility] on 07/14/2023 with reports 3 to 4-day history of stuttering speech and fluctuating LUE weakness followed by fall.  He reported some double vision and per discussion with his neurology I had increased prednisone  to 10 mg/day due to concerns of MG flare.  He was found to have left facial weakness with dysarthria as well as LUE and LLE weakness.  MRI brain done revealing acute right MCA infarction involving right periventricular white matter and extending into right frontal and parietal lobes.    [name]. was admitted to rehab 07/19/2023 for inpatient therapies  to consist of PT, ST and OT at least three hours five days a week. Past admission physiatrist, therapy team and rehab RN have worked together to provide customized collaborative inpatient rehab.  He continues on low-dose aspirin  and subcu Lovenox  was used for DVT prophylaxis during his stay.  His blood pressures were monitored on TID basis and has been stable on current regimen.  Serial check of CBC shows H&H to be relatively stable stool guaiac was negative x 1.  He continues to have lower extremity edema and was educated on importance of elevation for edema control.  Constipation has resolved with sorbitol  use on intermittent basis.  P.o. intake has been good.  He was noted to have Candida crura's which was treated with course of Diflucan.  MASD has resolved with use of Gerhardt's Butt cream.   Left hemiplegia as been a limiting factor with left knee pain noted with increase in activity.  EKG was added for support and pain management.  His diabetes has been monitored with ac/hs CBG checks and SSI was use prn for tighter BS control.  Metformin  was resumed at 5 mg twice daily and basal insulin  was slowly titrated upwards to 26 units.  Blood sugars continue to be variable and he was advised to increase metformin  to home dose and titrate insulin  glargine slowly if blood sugars range over 150.  He has made steady gains during his stay and continues to be limited by mild left inattention with left knee plegia and weakness. He has been evaluated for  speciality wheelchair by [employer]. He will continue to receive follow up outpatient PT, OT and ST at [facility] after discharge.   PAIN:  Are you having pain? No and reports Numbness in the L H and and Leg   PRECAUTIONS: Fall  RED FLAGS: Hx of MG   WEIGHT BEARING RESTRICTIONS: No  FALLS: Has patient fallen in last 6 months? Yes. Number of falls 1  LIVING ENVIRONMENT: Lives with: lives with their spouse Lives in: House/apartment Stairs: Ramps  installed while in the Hospital  Has following equipment at home: Otho Blitz - 2 wheeled and Wheelchair (power)  PLOF: Independent with basic ADLs, Independent with household mobility without  device, and Independent with community mobility with device with SPC   PATIENT GOALS: relearn to Walk 15-48ft.   OBJECTIVE:  Note: Objective measures were completed at Evaluation unless otherwise noted.  DIAGNOSTIC FINDINGS: MRI brain done revealing acute right MCA infarction involving right periventricular white matter and extending into right frontal and parietal lobes.  COGNITION: Overall cognitive status: Impaired poor awareness of L side while dual tasking.    SENSATION: Light touch: Impaired   COORDINATION: Limited ROM and strength on the LUE limiting finger to nose.  ROM and strength deficits limit ankle to knee.   EDEMA:  Mild edema in the L UE.   MUSCLE TONE: LLE: Mild    POSTURE: flexed trunk. Lateral lean to the L   LOWER EXTREMITY ROM:     Active  Right Eval Left Eval  Hip flexion    Hip extension    Hip abduction    Hip adduction    Hip internal rotation    Hip external rotation    Knee flexion    Knee extension    Ankle dorsiflexion    Ankle plantarflexion    Ankle inversion    Ankle eversion     (Blank rows = not tested)  LOWER EXTREMITY MMT:    MMT Right Eval Left Eval  Hip flexion 4+ 4-  Hip extension 4+ 3+  Hip abduction 4+ 3+  Hip adduction 4+ 3+  Hip internal rotation    Hip external rotation    Knee flexion 5 4-  Knee extension 5 4-  Ankle dorsiflexion 5 4  Ankle plantarflexion    Ankle inversion    Ankle eversion    (Blank rows = not tested)  BED MOBILITY:  Findings: Sit to supine Mod A and Max A Supine to sit Mod A and Max A  TRANSFERS: Sit to stand: Min A and Mod A  Assistive device utilized: Environmental consultant - 2 wheeled     Stand to sit: Min A  Assistive device utilized: Environmental consultant - 2 wheeled     Chair to chair: Min A and Mod A  Assistive device  utilized: Environmental consultant - 2 wheeled      PT required to stabilize the LUE on RW as well as provide max cues for attention to hand to prevent sliding off hand grip.  At home Pt utilizes hand splint for stability on RW to prevent loss of grip in transfers.   RAMP:  Not tested  CURB:  Not tested  STAIRS: Not tested GAIT: Findings: Gait Characteristics: step to pattern, decreased hip/knee flexion- Left, Left foot flat, ataxic, lateral lean- Left, wide BOS, and poor foot clearance- Left, Distance walked: 34ft, Assistive device utilized:Walker - 2 wheeled, Level of assistance: Mod A and Max A, and Comments: poor awareness of lateral/anterior L LOB as well as poor awareness of LUE causing loos of grip on RW and heavy lateral LOB to the L, +2 assist required to prevent fall and position chair under patient as he attempted turn at 3ft.    FUNCTIONAL TESTS:  5 times sit to stand: 1:29 min Timed up and go (TUG): unable to complete turn at 15ft.  10 meter walk test: unable to complete on this day.   PATIENT SURVEYS:  Stroke Impact Scale [phone]  TREATMENT DATE: 08/21/2023  Educated on prognosis, interpretation on 5xSTS and inability to complete TUG as it relates to fall risk and functional status.     PATIENT EDUCATION: Education details: POC. Goals.  Person educated: Patient and Spouse Education method: Explanation Education comprehension: verbalized understanding  HOME EXERCISE PROGRAM: To be provided at visit 2.   GOALS: Goals reviewed with patient? Yes  SHORT TERM GOALS: Target date: 09/25/2023    Patient will be independent in home exercise program to improve strength/mobility for better functional independence with ADLs. Baseline: to be given at visit 2.  Goal status: INITIAL   LONG TERM GOALS: Target date: 11/13/2023    Patient will increase SIS-16 score to  equal to or greater than 10points    to demonstrate statistically significant improvement in mobility and quality of life.  Baseline: 49/80 Goal status: INITIAL  2.  Patient (> 59 years old) will complete five times sit to stand test in < 15 seconds indicating an increased LE strength and improved balance. Baseline: 1:23min Goal status: INITIAL  3.  Patient will increase FIST score by > 6 points to demonstrate decreased fall risk during functional activities Baseline: to be completed  Goal status: INITIAL  4.  Patient will increase 10 meter walk test to >0.20m/s as to improve gait speed for better community ambulation and to reduce fall risk. Baseline: unable to complete  Goal status: INITIAL  5.  Patient will reduce timed up and go to <11 seconds to reduce fall risk and demonstrate improved transfer/gait ability. Baseline: unable to complete turn at 10 ft. Mod-max assist due to poor control of LW on RW, resulting in L lateral LOB. Goal status: INITIAL   ASSESSMENT:  CLINICAL IMPRESSION: Patient is a 73 y.o. male who was seen today for physical therapy evaluation and treatment for balance, coordination, gait, deficits following CVA in April. Pt has hx of MG and was initially treated for MG exacerbation, ut was then found to have CVA affection LUE>LLE as well as poor awareness of L side. Pt demonstrates difficulty with transfers, and decreased balance with prolonged 5x STS at 1:46min, inability to complete TUG or due to heavy LOB to the L and poor use of LUE on RW causing LOB to the L/anteriorly, requiring max assist to prevent fall. Pt has received loaner power WC from [employer], with plans to obtain custom power seating mobility within the next month or two. Pt is noted to have bandaged on L UE and RLE from hitting furniture in power WC. Has been non-ambulatory at home since d/c from [facility]. Pt will benefit from skilled PT to address strength, balance, coordination, attention deficits to  improve function and overall QoL and return to PLOF.   OBJECTIVE IMPAIRMENTS: Abnormal gait, cardiopulmonary status limiting activity, decreased activity tolerance, decreased balance, decreased cognition, decreased coordination, decreased endurance, decreased knowledge of condition, decreased knowledge of use of DME, decreased mobility, difficulty walking, decreased ROM, decreased strength, decreased safety awareness, dizziness, hypomobility, increased fascial restrictions, impaired perceived functional ability, increased muscle spasms, impaired sensation, impaired tone, impaired UE functional use, impaired vision/preception, improper body mechanics, postural dysfunction, and obesity.   ACTIVITY LIMITATIONS: carrying, lifting, bending, sitting, standing, squatting, stairs, transfers, bed mobility, bathing, toileting, dressing, reach over head, hygiene/grooming, locomotion level, and caring for others  PARTICIPATION LIMITATIONS: meal prep, cleaning, laundry, medication management, interpersonal relationship, driving, shopping, community activity, and yard work  PERSONAL FACTORS: Age, Past/current experiences, Time since onset of injury/illness/exacerbation, and 3+ comorbidities: MG, HTN, CVA,  lymphedema are also affecting patient's functional outcome.   REHAB POTENTIAL: Good  CLINICAL DECISION MAKING: Unstable/unpredictable  EVALUATION COMPLEXITY: High  PLAN:  PT FREQUENCY: 1-2x/week  PT DURATION: 12 weeks  PLANNED INTERVENTIONS: [id number]- PT Re-evaluation, 97750- Physical Performance Testing, 97110-Therapeutic exercises, 97530- Therapeutic activity, [id number]- Neuromuscular re-education, 97535- Self Care, 96045- Manual therapy, [id number]- Gait training, [id number]- Orthotic Initial, [id number]- Orthotic/Prosthetic subsequent, [phone]- Canalith repositioning, [id number]- Electrical stimulation (unattended), [phone]- Electrical stimulation (manual), [id number]- Wound care (first 20 sq cm), 97598- Wound care (each additional 20 sq  cm), Patient/Family education, Balance training, Stair training, Taping, Dry Needling, Joint mobilization, Joint manipulation, Vestibular training, Visual/preceptual remediation/compensation, Cognitive remediation, DME instructions, Wheelchair mobility training, Cryotherapy, and Moist heat  PLAN FOR NEXT SESSION:  Complete Fist.  Re-assess gait with various AD/ hand splint on RW.  Provide HEP.   Barbara Book, PT 08/20/2023, 2:09 PM

## 2023-08-22 ENCOUNTER — Ambulatory Visit: Admitting: Speech Pathology

## 2023-08-22 ENCOUNTER — Ambulatory Visit: Admitting: Occupational Therapy

## 2023-08-22 ENCOUNTER — Ambulatory Visit: Admitting: Physical Therapy

## 2023-08-22 DIAGNOSIS — R269 Unspecified abnormalities of gait and mobility: Secondary | ICD-10-CM | POA: Diagnosis not present

## 2023-08-22 DIAGNOSIS — R41841 Cognitive communication deficit: Secondary | ICD-10-CM

## 2023-08-22 DIAGNOSIS — M6281 Muscle weakness (generalized): Secondary | ICD-10-CM

## 2023-08-22 DIAGNOSIS — H539 Unspecified visual disturbance: Secondary | ICD-10-CM

## 2023-08-22 DIAGNOSIS — R278 Other lack of coordination: Secondary | ICD-10-CM

## 2023-08-22 DIAGNOSIS — I69354 Hemiplegia and hemiparesis following cerebral infarction affecting left non-dominant side: Secondary | ICD-10-CM

## 2023-08-22 NOTE — Therapy (Signed)
 OUTPATIENT PHYSICAL THERAPY NEURO TREATMENT   Patient Name: [name]. MRN: [id number] DOB:[birth year], 73 y.o., male Today's Date: 08/22/2023   PCP: Glenora Laos, MD  REFERRING PROVIDER:    Sterling Eisenmenger, PA-C    END OF SESSION:   PT End of Session - 08/22/23 0805     Visit Number 2    Number of Visits 24    Date for PT Re-Evaluation 11/12/23    PT Start Time 0805    PT Stop Time 0847    PT Time Calculation (min) 42 min    Equipment Utilized During Treatment Gait belt    Activity Tolerance Patient tolerated treatment well    Behavior During Therapy WFL for tasks assessed/performed              Past Medical History:  Diagnosis Date   Anemia    Cervical myelopathy (HCC)    Chronic venous insufficiency    [name] (chronic kidney disease), stage III (HCC)    Diabetes mellitus without complication (HCC)    GERD (gastroesophageal reflux disease)    Hypercholesteremia    Hypothyroidism    Kidney stones    about 50 in the past--last in March 4/24   Leg weakness, bilateral    due to Myasthenia Gravis   Myasthenia gravis (HCC)    Spinal stenosis    Vitamin B 12 deficiency    Vitamin B 12 deficiency    Wears hearing aid    bilateral   Past Surgical History:  Procedure Laterality Date   BACK SURGERY  09/2015   CARDIAC CATHETERIZATION     CATARACT EXTRACTION W/ INTRAOCULAR LENS IMPLANT     CATARACT EXTRACTION W/PHACO Right 12/21/2015   Procedure: CATARACT EXTRACTION PHACO AND INTRAOCULAR LENS PLACEMENT (IOC);  Surgeon: Annell Kidney, MD;  Location: [facility];  Service: Ophthalmology;  Laterality: Right;  DIABETIC - insulin  and oral meds   COLONOSCOPY     SHOULDER SURGERY     labrum repair   TONSILLECTOMY     Patient Active Problem List   Diagnosis Date Noted   High blood pressure 08/13/2023   Anemia 08/13/2023   Constipation 08/13/2023   Myasthenia gravis (HCC) 08/13/2023   Acute ischemic right MCA stroke (HCC) 07/19/2023    Lymphedema 12/27/2016   Leg pain 07/15/2016   Chronic venous insufficiency 07/15/2016   Swelling of limb 07/15/2016   Type 2 diabetes mellitus (HCC) 07/15/2016   Hyperlipidemia 07/15/2016    ONSET DATE:   REFERRING DIAG:  Diagnosis  I63.511 (ICD-10-CM) - Cerebral infarction due to unspecified occlusion or stenosis of right middle cerebral artery    THERAPY DIAG:  Muscle weakness (generalized)  Abnormality of gait and mobility  Other lack of coordination  Hemiplegia and hemiparesis following cerebral infarction affecting left non-dominant side (HCC)  Rationale for Evaluation and Treatment: Rehabilitation  SUBJECTIVE:  SUBJECTIVE STATEMENT:  Pt states when he went to transfer into the wheelchair this morning his L knee buckled and he went/slid down to the ground. States he stood up with the walker from the edge of his bed and when he went to turn and step his L LE started to buckle and he couldn't recover. Pt denies any injuries from the fall.  Pt with bandages on his L arm stating when driving his power wheelchair he will bump his L elbow on the door facings; however, pt/wife report they removed the door facings to allow larger doorways.   Pt states his loaner power wheelchair control is really sensitive and he is having a hard time controlling it sufficiently indoors to avoid bumping into objects.   Pt wearing L swedish knee cage. Pt's wife brought pt's personal L hand splint for RW to use during session.   From Initial Eval: Pt reports that he is doing well. Has not been moving much since discharging from [facility], as therapists instructed pt to perform transfers only at home due to high fall risk with ambulation. Continues to have significant weakenss in the LUE with difficulty extending fingers  as well as numbness and inattention to the L side of body resulting in multiple cuts on Arm and leg with WC mobility in home.  Will be attaining custom power WC from [employer]. Has loaner currently.   Pt accompanied by: significant other [name]   PERTINENT HISTORY: [name]. is a 73 y.o. male with history of T2DM, HTN, [name], renal calculi, seropositive myasthenia gravis who was admitted to [facility] on 07/14/2023 with reports 3 to 4-day history of stuttering speech and fluctuating LUE weakness followed by fall.  He reported some double vision and per discussion with his neurology I had increased prednisone  to 10 mg/day due to concerns of MG flare.  He was found to have left facial weakness with dysarthria as well as LUE and LLE weakness.  MRI brain done revealing acute right MCA infarction involving right periventricular white matter and extending into right frontal and parietal lobes.    [name]. was admitted to rehab 07/19/2023 for inpatient therapies to consist of PT, ST and OT at least three hours five days a week. Past admission physiatrist, therapy team and rehab RN have worked together to provide customized collaborative inpatient rehab.  He continues on low-dose aspirin  and subcu Lovenox  was used for DVT prophylaxis during his stay.  His blood pressures were monitored on TID basis and has been stable on current regimen.  Serial check of CBC shows H&H to be relatively stable stool guaiac was negative x 1.  He continues to have lower extremity edema and was educated on importance of elevation for edema control.  Constipation has resolved with sorbitol  use on intermittent basis.  P.o. intake has been good.  He was noted to have Candida crura's which was treated with course of Diflucan.  MASD has resolved with use of Gerhardt's Butt cream.   Left hemiplegia as been a limiting factor with left knee pain noted with increase in activity.  EKG was added for support and pain management.  His  diabetes has been monitored with ac/hs CBG checks and SSI was use prn for tighter BS control.  Metformin  was resumed at 5 mg twice daily and basal insulin  was slowly titrated upwards to 26 units.  Blood sugars continue to be variable and he was advised to increase metformin  to home dose and titrate insulin   glargine slowly if blood sugars range over 150.  He has made steady gains during his stay and continues to be limited by mild left inattention with left knee plegia and weakness. He has been evaluated for  speciality wheelchair by [employer]. He will continue to receive follow up outpatient PT, OT and ST at [facility] after discharge.   PAIN:  Are you having pain? No and reports Numbness in the L H and and Leg   PRECAUTIONS: Fall  RED FLAGS: Hx of MG   WEIGHT BEARING RESTRICTIONS: No  FALLS: Has patient fallen in last 6 months? Yes. Number of falls 1  LIVING ENVIRONMENT: Lives with: lives with their spouse Lives in: House/apartment Stairs: Ramps installed while in the Hospital  Has following equipment at home: Otho Blitz - 2 wheeled and Wheelchair (power)  PLOF: Independent with basic ADLs, Independent with household mobility without device, and Independent with community mobility with device with [facility]   PATIENT GOALS: relearn to Walk 15-67ft.   OBJECTIVE:  Note: Objective measures were completed at Evaluation unless otherwise noted.  DIAGNOSTIC FINDINGS: MRI brain done revealing acute right MCA infarction involving right periventricular white matter and extending into right frontal and parietal lobes.  COGNITION: Overall cognitive status: Impaired poor awareness of L side while dual tasking.    SENSATION: Light touch: Impaired   COORDINATION: Limited ROM and strength on the LUE limiting finger to nose.  ROM and strength deficits limit ankle to knee.   EDEMA:  Mild edema in the L UE.   MUSCLE TONE: LLE: Mild    POSTURE: flexed trunk. Lateral lean to the L    LOWER EXTREMITY ROM:     Active  Right Eval Left Eval  Hip flexion    Hip extension    Hip abduction    Hip adduction    Hip internal rotation    Hip external rotation    Knee flexion    Knee extension    Ankle dorsiflexion    Ankle plantarflexion    Ankle inversion    Ankle eversion     (Blank rows = not tested)  LOWER EXTREMITY MMT:    MMT Right Eval Left Eval  Hip flexion 4+ 4-  Hip extension 4+ 3+  Hip abduction 4+ 3+  Hip adduction 4+ 3+  Hip internal rotation    Hip external rotation    Knee flexion 5 4-  Knee extension 5 4-  Ankle dorsiflexion 5 4  Ankle plantarflexion    Ankle inversion    Ankle eversion    (Blank rows = not tested)  BED MOBILITY:  Findings: Sit to supine Mod A and Max A Supine to sit Mod A and Max A  TRANSFERS: Sit to stand: Min A and Mod A  Assistive device utilized: Environmental consultant - 2 wheeled     Stand to sit: Min A  Assistive device utilized: Environmental consultant - 2 wheeled     Chair to chair: Min A and Mod A  Assistive device utilized: Environmental consultant - 2 wheeled      PT required to stabilize the LUE on RW as well as provide max cues for attention to hand to prevent sliding off hand grip.  At home Pt utilizes hand splint for stability on RW to prevent loss of grip in transfers.   RAMP:  Not tested  CURB:  Not tested  STAIRS: Not tested GAIT: Findings: Gait Characteristics: step to pattern, decreased hip/knee flexion- Left, Left foot flat, ataxic, lateral lean-  Left, wide BOS, and poor foot clearance- Left, Distance walked: 46ft, Assistive device utilized:Walker - 2 wheeled, Level of assistance: Mod A and Max A, and Comments: poor awareness of lateral/anterior L LOB as well as poor awareness of LUE causing loos of grip on RW and heavy lateral LOB to the L, +2 assist required to prevent fall and position chair under patient as he attempted turn at 24ft.    FUNCTIONAL TESTS:  5 times sit to stand: 1:29 min Timed up and go (TUG): unable to complete turn  at 60ft.  10 meter walk test: unable to complete on this day.   PATIENT SURVEYS:  Stroke Impact Scale 49/80                                                                                                                              TREATMENT DATE: 08/22/2023  Therapist educated pt/wife on recommendation to call [employer] and have them come to assess the settings on his loaner PWC since he said they are too sensitive placing him at increased risk for running into objects in the home.  Pt wearing L swedish knee cage. Pt's wife brought pt's personal L hand splint for RW to use during session.  Sit>stand from hospital wheelchair>bari-RW with L hand splint requiring 2x attempts, but then stood with heavy min A as pt lacks sufficient and sustained anterior trunk lean/weight shift when rising to stand. Pt also requires manual assist/facilitation to position L LE prior to initiating coming to stand, especially from the low seat height of this w/c. Cuing for pt to push-up with R hand from w/c armrest.  R stand pivot w/c>EOM using bari-RW with min A for safety and pt requiring cuing to keep AD close to him when turning, pt tends not to use L hand to pull AD close to him.  Function In Sitting Test (FIST)  (1/2 femur on surface; hips/knees flexed to 90deg)   - indicate bed or mat table / step stool if used --- performed from mat table  SCORING KEY: 4 = Independent (completes task independently & successfully) 3 = Verbal Cues/Increased Time (completes task independently & successfully and only needs more time/cues) 2 = Upper Extremity Support (must use UE for support or assistance to complete successfully) 1 = Needs Assistance (unable to complete w/o physical assist; DOCUMENT LEVEL: min, mod, max) 0 = Dependent (requires complete physical assist; unable to complete successfully even w/ physical assist)  Randomly Administer Once Throughout Exam  4 - Anterior Nudge (superior sternum)  4 - Posterior  Nudge (between scapular spines)  4 - Lateral Nudge (to dominant side at acromion)     4 - Static sitting (30 seconds)  4 - Sitting, shake 'no' (left and right)  4 - Sitting, eyes closed (30 seconds)   3 - Sitting, lift foot (dominant side, lift foot 1 inch twice)    4 - Pick up object from behind (object at midline, hands  breadth posterior)  4 - Forward reach (use dominant arm, must complete full motion) 2 - Lateral reach (use dominant arm, clear opposite ischial tuberosity) 3 - Pick up object from floor (from between feet)   1, min A - Posterior scooting (move backwards 2 inches)  1, min A - Anterior scooting (move forward 2 inches)  1, min A - Lateral scooting (move to dominant side 2 inches)    TOTAL = 43/56  Notes/comments: greatest challenge with clearing hips fully to scoot and when reaching due to lack of full weight shift  MCD > 5 points MCID for IP REHAB > 6 points   Gait training 47ft using bari-RW with L hand splint and heavy min/light mod assist for balance due to impaired midline orientation and requiring +2 w/c follow for safety . Pt demonstrating the following gait deviations with therapist providing the described cuing and facilitation for improvement:  excessive L LE external rotations and adduction with impaired proprioceptive/sensory awareness of L foot location L knee hyperextends into swedish knee cage during stance strong L lean requiring repeated cuing and facilitation for increased R wt shift especially during R stance Also requires repeated assist/facilitation to adjust AD to account for this Potential pushing syndrome contributing to L lean +2 w/c follow for safety  Pt and wife very excited with gait distance achieve today with pt reporting that is the furthest he has ambulated since having his stroke.  Block practice stand pivot transfer EOM<>wheelchair using bari-RW and L hand splint with min A as described above.    PATIENT EDUCATION: Education  details: POC. Goals.  Person educated: Patient and Spouse Education method: Explanation Education comprehension: verbalized understanding  HOME EXERCISE PROGRAM:  To be provided at upcoming visits  GOALS: Goals reviewed with patient? Yes  SHORT TERM GOALS: Target date: 09/25/2023    Patient will be independent in home exercise program to improve strength/mobility for better functional independence with ADLs. Baseline: To be provided at upcoming visits Goal status: INITIAL   LONG TERM GOALS: Target date: 11/13/2023    Patient will increase SIS-16 score to equal to or greater than 10points    to demonstrate statistically significant improvement in mobility and quality of life.  Baseline: 49/80 Goal status: INITIAL  2.  Patient (> 56 years old) will complete five times sit to stand test in < 15 seconds indicating an increased LE strength and improved balance. Baseline: 1:22min Goal status: INITIAL  3.  Patient will increase FIST score by > 6 points to demonstrate decreased fall risk during functional activities Baseline:  43/56  Goal status: INITIAL  4.  Patient will increase 10 meter walk test to >0.51m/s as to improve gait speed for better community ambulation and to reduce fall risk. Baseline: unable to complete  Goal status: INITIAL  5.  Patient will reduce timed up and go to <11 seconds to reduce fall risk and demonstrate improved transfer/gait ability. Baseline: unable to complete turn at 10 ft. Mod-max assist due to poor control of LW on RW, resulting in L lateral LOB. Goal status: INITIAL   ASSESSMENT:  CLINICAL IMPRESSION:  Patient is a 73 y.o. male who was seen today for physical therapy treatment for balance, coordination, and gait deficits following CVA in April. Pt has hx of MG and was initially treated for MG exacerbation, but was then found to have CVA affection LUE>LLE as well as poor awareness of L side. Patient reported having a lowering to the floor this  morning when  transferring LEFT from EOB>wheelchair at home due to L knee buckling, but sustained no injuries. Patient's wife brought in pt's L RW hand splint for use during session allowing increased participation in standing and gait training because pt with significant L hemibody inattention/impaired proprioception impacting his awareness and coordination of that side. Pt reports having difficulty with driving loaner power WC from [employer] due to it being too sensitivity; therefore, therapist educated on recommendation to reach out to them to come make adjustments. Pt is noted to have bandaged on L UE and RLE from hitting furniture/doorways in power WC. Patient able to participate in gait training today using bari-RW with skilled heavy min/light mod A due to impaired midline orientation with strong L lean, impaired L LE proprioception and coordination, and generalized weakness with impaired endurance (requires +2 w/c follow for safety). Pt has been non-ambulatory at home since d/c from CIR. Pt will benefit from skilled PT to address strength, balance, coordination, attention deficits to improve function and overall QoL and return to PLOF.   OBJECTIVE IMPAIRMENTS: Abnormal gait, cardiopulmonary status limiting activity, decreased activity tolerance, decreased balance, decreased cognition, decreased coordination, decreased endurance, decreased knowledge of condition, decreased knowledge of use of DME, decreased mobility, difficulty walking, decreased ROM, decreased strength, decreased safety awareness, dizziness, hypomobility, increased fascial restrictions, impaired perceived functional ability, increased muscle spasms, impaired sensation, impaired tone, impaired UE functional use, impaired vision/preception, improper body mechanics, postural dysfunction, and obesity.   ACTIVITY LIMITATIONS: carrying, lifting, bending, sitting, standing, squatting, stairs, transfers, bed mobility, bathing, toileting, dressing,  reach over head, hygiene/grooming, locomotion level, and caring for others  PARTICIPATION LIMITATIONS: meal prep, cleaning, laundry, medication management, interpersonal relationship, driving, shopping, community activity, and yard work  PERSONAL FACTORS: Age, Past/current experiences, Time since onset of injury/illness/exacerbation, and 3+ comorbidities: MG, HTN, CVA, lymphedema are also affecting patient's functional outcome.   REHAB POTENTIAL: Good  CLINICAL DECISION MAKING: Unstable/unpredictable  EVALUATION COMPLEXITY: High  PLAN:  PT FREQUENCY: 1-2x/week  PT DURATION: 12 weeks  PLANNED INTERVENTIONS: 97164- PT Re-evaluation, 97750- Physical Performance Testing, 97110-Therapeutic exercises, 97530- Therapeutic activity, [id number]- Neuromuscular re-education, 97535- Self Care, 86578- Manual therapy, [id number]- Gait training, [phone]- Orthotic Initial, [phone]- Orthotic/Prosthetic subsequent, [phone]- Canalith repositioning, [id number]- Electrical stimulation (unattended), [phone]- Electrical stimulation (manual), [id number]- Wound care (first 20 sq cm), 97598- Wound care (each additional 20 sq cm), Patient/Family education, Balance training, Stair training, Taping, Dry Needling, Joint mobilization, Joint manipulation, Vestibular training, Visual/preceptual remediation/compensation, Cognitive remediation, DME instructions, Wheelchair mobility training, Cryotherapy, and Moist heat  PLAN FOR NEXT SESSION:  Gait training with L hand splint on bari-RW Work on midline orientation and R weight shifting Work on L LE coordination and strength for improved swing phase advancement Provide HEP Complete when appropriate   Koleson Reifsteck, PT, DPT, NCS, CSRS Physical Therapist - [place]  [facility]  11:21 AM 08/22/23

## 2023-08-22 NOTE — Therapy (Addendum)
 OUTPATIENT OCCUPATIONAL THERAPY NEURO TREATMENT  Patient Name: [name]. MRN: [id number] DOB:Jul 17, 1950, 73 y.o., male Today's Date: 08/22/2023  PCP: Arvel Birch, MD REFERRING PROVIDER: Leotha Rang, PA-C  END OF SESSION:  OT End of Session - 08/22/23 0942     Visit Number 2    Number of Visits 24    Date for OT Re-Evaluation 11/12/23    OT Start Time 0845    OT Stop Time 0930    OT Time Calculation (min) 45 min    Activity Tolerance Patient tolerated treatment well    Behavior During Therapy [facility] for tasks assessed/performed             Past Medical History:  Diagnosis Date   Anemia    Cervical myelopathy (HCC)    Chronic venous insufficiency    [name] (chronic kidney disease), stage III (HCC)    Diabetes mellitus without complication (HCC)    GERD (gastroesophageal reflux disease)    Hypercholesteremia    Hypothyroidism    Kidney stones    about 50 in the past--last in March 4/24   Leg weakness, bilateral    due to Myasthenia Gravis   Myasthenia gravis (HCC)    Spinal stenosis    Vitamin B 12 deficiency    Vitamin B 12 deficiency    Wears hearing aid    bilateral   Past Surgical History:  Procedure Laterality Date   BACK SURGERY  09/2015   CARDIAC CATHETERIZATION     CATARACT EXTRACTION W/ INTRAOCULAR LENS IMPLANT     CATARACT EXTRACTION W/PHACO Right 12/21/2015   Procedure: CATARACT EXTRACTION PHACO AND INTRAOCULAR LENS PLACEMENT (IOC);  Surgeon: Annell Kidney, MD;  Location: [facility];  Service: Ophthalmology;  Laterality: Right;  DIABETIC - insulin  and oral meds   COLONOSCOPY     SHOULDER SURGERY     labrum repair   TONSILLECTOMY     Patient Active Problem List   Diagnosis Date Noted   High blood pressure 08/13/2023   Anemia 08/13/2023   Constipation 08/13/2023   Myasthenia gravis (HCC) 08/13/2023   Acute ischemic right MCA stroke (HCC) 07/19/2023   Lymphedema 12/27/2016   Leg pain 07/15/2016   Chronic venous  insufficiency 07/15/2016   Swelling of limb 07/15/2016   Type 2 diabetes mellitus (HCC) 07/15/2016   Hyperlipidemia 07/15/2016    ONSET DATE: 07/13/2023  REFERRING DIAG: Acute ischemic R MCA CVA  THERAPY DIAG:    Rationale for Evaluation and Treatment: Rehabilitation  SUBJECTIVE:   SUBJECTIVE STATEMENT:  Pt. Reports having a fall this morning and had to call EMS for assistance to get up and get into the car.  Pt accompanied by: significant other  PERTINENT HISTORY: Pt. Was admitted to [facility] on 07/13/2023 after sustaining a CVA while on a trip to the beach. Pt. Was diagnosed with R MCA CVA. Pt. Was admitted to inpatient rehab from 07/19/2023-08/13/2023. Past medical history includes high BP, anemia, and Myasthenia Gravis.   PRECAUTIONS: None  WEIGHT BEARING RESTRICTIONS: No  PAIN:  Are you having pain? None  FALLS: Has patient fallen in last 6 months? Yes  LIVING ENVIRONMENT: Lives with: lives with their family and lives with their spouse-  Lives in: House/apartment- 2 story home and resides mostly on the 1st floor Stairs: No- Ramped entrance Has following equipment at home:  Otho Blitz, cane, Steadi, shower chair, handheld shower head, bedside commode  PLOF: Independent and Independent with basic ADLs Independent at home   PATIENT GOALS: Would  like to walk 10-20 feet each time.   OBJECTIVE:  Note: Objective measures were completed at Evaluation unless otherwise noted.  HAND DOMINANCE: Right  ADLs: Overall ADLs:  Transfers/ambulation related to ADLs: Eating: Independent, 100% R handed Grooming: independent UB Dressing: Can put on shirt independently LB Dressing: Difficulty with pants and requires assistance getting the pants on his feet, has difficulty putting on shoes and socks Toileting: Tot A for toilet hygiene. Uses Steadi during toilet transfers. Bathing: Requires assist with using the L arm to wash the R arm. Tub Shower transfers: Roll in shower, built in shower  chair, grab bars and hand held shower head.  IADLs: Shopping: Total A (Wife typically did the shopping prior to onset)   Light housekeeping: Total A (wife typically performs prior to onset) Meal Prep: Independent with light snack prep Community mobility: No driving, able to get in/out of the car Medication management: Set up assistance from his wife using a pill box (pt. Is responsible for taking medications at the correct time) Financial management: family members check behind him. Handwriting: TBD Hobbies: gardening, wood working and sports- [employer] Work history: Owns a tax business MOBILITY STATUS: Needs Assist:    POSTURE COMMENTS:   Sitting balance: Good supported sitting balance   FUNCTIONAL OUTCOME MEASURES: TBD   UPPER EXTREMITY ROM:    Active ROM Right Eval  WFL Left eval  Shoulder flexion  89(110)  Shoulder abduction  98(120)  Shoulder adduction    Shoulder extension    Shoulder internal rotation    Shoulder external rotation    Elbow flexion  120(140)  Elbow extension  -28(-10)  Wrist flexion  60(60) Rest at 60  Wrist extension  -48(42)  Wrist ulnar deviation    Wrist radial deviation    Wrist pronation    Wrist supination    (Blank rows = not tested)  L Digit flexion: 2nd:3cm (0cm) 3rd: 0cm (0cm), 4th: 0cm (0cm), 5th: 2cm (0cm)  L Digit extension: Active Gross digit extension through 10% of ROM UPPER EXTREMITY MMT:     MMT Right eval Left eval  Shoulder flexion 5 3-  Shoulder abduction 5 3-  Shoulder adduction    Shoulder extension    Shoulder internal rotation    Shoulder external rotation    Middle trapezius    Lower trapezius    Elbow flexion 5 TBD  Elbow extension 5 TBD  Wrist flexion    Wrist extension  2-  Wrist ulnar deviation    Wrist radial deviation    Wrist pronation    Wrist supination    (Blank rows = not tested)  HAND FUNCTION:   COORDINATION:   SENSATION: Sensation feels different in both hands.  L hand Light  touch- impaired L hand Proprioception- impaired  EDEMA: Has had hx of edema, and came to Eval session with swelling in the L hand/wrist/forearm.   *pt. Has a laceration from his dog on the dorsal aspect of the L hand.   MUSCLE TONE: Increased tone through the UE and hand flexors.   COGNITION: Overall cognitive status: Within functional limits for tasks assessed  VISION: Subjective report: L sided inattention Baseline vision: Wears glasses for reading only Visual history: Right visual occlusion  VISION ASSESSMENT: To be assessed   PERCEPTION: TBD  PRAXIS: Impaired  TREATMENT DATE: 08/22/2023  Self-Care:   -Pt. Was fit for and provided with new straps for his walker splint.   Therapeutic Activities:   -BIVABA visual screening  -Single letter search-simple; 1 min & 14 secs with no omissions,utilized horizontal visual search strategies -Single letter search-crowded; 1 min & 17 secs with 1 omission to the left, utilized horizontal visual search strategies -Word search;1 min & 54 secs with no omissions, utilized horizontal visual search strategies -Structured complex circles search; 54 secs with 3 omissions on the left, utilized visual horizontal rectilinear search strategies   - Random plain circles- simple; 36 secs with no omissions,utilized random, disorganized search strategies, and horizontal rectilinear visual search strategies.  -Random plain circles- crowded; 1 min & 40 secs with no omissions, utilized a vertical rectilinear and random, disorganized random search strategies -Random complex circle search; 4 mins & 7 secs with 4 omissions on the right, utilized random, disorganized visual search strategies -Pt. Was able to make 100% corrections requiring cues and increased time. -Facilitated elbow flexion and extension in combination with forearm  supination and wrist extension through diagonal patterns with hand-over-hand assist in preparation for hand and face patterns for self grooming  Therapeutic Ex.:   -Shoulder AAROM through L shoulder flexion, abduction, horizontal abduction and adduction -AAROM elbow flexion and extension -AROM wrist extension -PROM & AROM digit extension            PATIENT EDUCATION: Education details: OT services plan of care goals and pt. LUE and functional status. Person educated: Patient and Spouse Education method: Explanation, Demonstration, Tactile cues, and Verbal cues Education comprehension: verbalized understanding, returned demonstration, and needs further education  HOME EXERCISE PROGRAM: Continue to assess and provide ongoing basis as indicated.    GOALS: Goals reviewed with patient? Yes  SHORT TERM GOALS: Target date: 10/01/2023   Pt. Will be independent with HEP for the LUE.  Baseline: Eval; no current HEP Goal status: INITIAL   LONG TERM GOALS: Target date: 11/12/2023  Pt. Will increase L shoulder flexion by 10 degrees to be able to reach up to shelves.  Baseline: Eval; L shoulder flexion is 89 (110). Goal status: INITIAL  2.  Pt. Will increase L shoulder abduction by 10 degrees to assist with underarm hygiene.  Baseline: Eval: L shoulder abduction is 98 (120).  Goal status: INITIAL  3.  Pt. Will improve L wrist extension by 10 degrees to be able to initiate anticipation of grasping for objects.  Baseline: Eval: -48 (42) Goal status: INITIAL  4.  Pt. Will improve active gross digit extension through 25% of the range to be able to release objects in his hand consistently.  Baseline: Eval: gross digit extension is 10% of the range  Goal status: INITIAL  5.  Pt. Will independently demonstrate visual compensatory strategies when navigating through environment and during tabletop tasks. Baseline: Pt. Requires consistent cuing for L sided awareness.  Goal status:  INITIAL   ASSESSMENT:  CLINICAL IMPRESSION: Pt. Presented with omissions on both left and right side during the visual scanning task. Pt. Was able to make corrections with 100% accuracy with cuing and increased time. Pt. Is improving with L shoulder ROM. However requires hand-over-hand assist for all hand to face patterns of movement. Pt. Is improving with Active wrist extension.However, presents with limited gross digit extension affecting his ability to actively release objects from his hand. Pt. Continues to benefit from OT services to work on improving LUE functioning to increase engagement of the LUE during  daily task, and to provide education about compensatory strategies during ADLs/IADLs.  PERFORMANCE DEFICITS: in functional skills including ADLs, IADLs, coordination, dexterity, proprioception, sensation, edema, tone, ROM, strength, pain, Fine motor control, Gross motor control, mobility, balance, endurance, vision, and UE functional use, cognitive skills including perception, and psychosocial skills including coping strategies, environmental adaptation, and routines and behaviors.   IMPAIRMENTS: are limiting patient from ADLs, IADLs, and leisure.   CO-MORBIDITIES: may have co-morbidities  that affects occupational performance. Patient will benefit from skilled OT to address above impairments and improve overall function.  MODIFICATION OR ASSISTANCE TO COMPLETE EVALUATION: Min-Moderate modification of tasks or assist with assess necessary to complete an evaluation.  OT OCCUPATIONAL PROFILE AND HISTORY: Detailed assessment: Review of records and additional review of physical, cognitive, psychosocial history related to current functional performance.  CLINICAL DECISION MAKING: Moderate - several treatment options, min-mod task modification necessary  REHAB POTENTIAL: Good  EVALUATION COMPLEXITY: Moderate    PLAN:  OT FREQUENCY: 2x/week  OT DURATION: 12 weeks  PLANNED  INTERVENTIONS: 97168 OT Re-evaluation, 97535 self care/ADL training, 16109 therapeutic exercise, 97530 therapeutic activity, 97112 neuromuscular re-education, 97140 manual therapy, 97018 paraffin, 60454 moist heat, 97010 cryotherapy, 97034 contrast bath, 97760 Orthotic Initial, 97763 Orthotic/Prosthetic subsequent, passive range of motion, visual/perceptual remediation/compensation, energy conservation, and DME and/or AE instructions  RECOMMENDED OTHER SERVICES: PT and ST  CONSULTED AND AGREED WITH PLAN OF CARE: Patient and family member/caregiver  PLAN FOR NEXT SESSION: Treatment   Debora Fallen, Student-OT 08/22/2023, 9:46 AM  This entire session was performed under direct supervision and direction of a licensed therapist/therapist assistant . I have personally read, edited and approve of the note as written.   Duey Ghent, MS, OTR/L  08/22/23

## 2023-08-22 NOTE — Therapy (Signed)
 OUTPATIENT SPEECH LANGUAGE PATHOLOGY  COGNITION TREATMENT NOTE   Patient Name: [name]. MRN: [id number] DOB:[birth year], 73 y.o., male Today's Date: 08/22/2023  PCP: Douglas Laos, MD REFERRING PROVIDER: Georjean Kite, PA-C   End of Session - 08/22/23 1016     Visit Number 2    Number of Visits 25    Date for SLP Re-Evaluation 11/12/23    Authorization Type [employer]    Progress Note Due on Visit 10    SLP Start Time 0930    SLP Stop Time  1015    SLP Time Calculation (min) 45 min    Activity Tolerance Patient tolerated treatment well             Past Medical History:  Diagnosis Date   Anemia    Cervical myelopathy (HCC)    Chronic venous insufficiency    [name] (chronic kidney disease), stage III (HCC)    Diabetes mellitus without complication (HCC)    GERD (gastroesophageal reflux disease)    Hypercholesteremia    Hypothyroidism    Kidney stones    about 50 in the past--last in March 4/24   Leg weakness, bilateral    due to Myasthenia Gravis   Myasthenia gravis (HCC)    Spinal stenosis    Vitamin B 12 deficiency    Vitamin B 12 deficiency    Wears hearing aid    bilateral   Past Surgical History:  Procedure Laterality Date   BACK SURGERY  09/2015   CARDIAC CATHETERIZATION     CATARACT EXTRACTION W/ INTRAOCULAR LENS IMPLANT     CATARACT EXTRACTION W/PHACO Right 12/21/2015   Procedure: CATARACT EXTRACTION PHACO AND INTRAOCULAR LENS PLACEMENT (IOC);  Surgeon: Annell Kidney, MD;  Location: [facility];  Service: Ophthalmology;  Laterality: Right;  DIABETIC - insulin  and oral meds   COLONOSCOPY     SHOULDER SURGERY     labrum repair   TONSILLECTOMY     Patient Active Problem List   Diagnosis Date Noted   High blood pressure 08/13/2023   Anemia 08/13/2023   Constipation 08/13/2023   Myasthenia gravis (HCC) 08/13/2023   Acute ischemic right MCA stroke (HCC) 07/19/2023   Lymphedema 12/27/2016   Leg pain  07/15/2016   Chronic venous insufficiency 07/15/2016   Swelling of limb 07/15/2016   Type 2 diabetes mellitus (HCC) 07/15/2016   Hyperlipidemia 07/15/2016    ONSET DATE: 07/19/2023   REFERRING DIAG: B28.413 (ICD-10-CM) - Cerebral infarction due to unspecified occlusion or stenosis of right middle cerebral artery  THERAPY DIAG:  Cognitive communication deficit  Rationale for Evaluation and Treatment Rehabilitation  SUBJECTIVE:   PERTINENT HISTORY and DIAGNOSTIC FINDINGS:   [name] is a 73 y.o. 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  CT Brain w/o contrast: 07/14/2023 New hypodensity within the right periventricular white matter extending into the inferior frontal lobe/operculum compatible with a recent infarct.   MRI Brain w/o contrast: 07/15/2023 IMPRESSION:  Acute right MCA distribution infarct involving the right periventricular white matter extending into the right frontal and parietal lobes and insula , some of which correlates to the recent CT finding. No significant associated mass effect or associated hemorrhage.   MRA head angiogram 07/16/2023 IMPRESSION:  Focal occlusion of the right inferior division distal M2 MCA (6:123) in keeping with known right MCA infarct when compared to  prior MRI brain 07/15/2023.   CT head 07/17/2023 IMPRESSION:  Expansion of territory involved in the right MCA infarct compared with MRI of 07/15/2023. Increasing local mass effect with new leftward midline shift of 3 mm. No hemorrhage.   Speech Language Evaluation at [facility] 07/19/2023 Patient presents with cognitive deficits in the areas of problem solving and left inattention as evidenced throughout patient/family interview and performance on Cognistat evaluation. Patient further  demonstrates impairments in the areas of intellectual, anticipatory, and emergent awareness of deficits/mistakes.   Speech Language Evaluation at [facility] 07/31/2023 Skilled therapy session focused on re-evaluation of cognitive skills utilizing the CLQT standardized assessment. Patient scored with overall mild cognitive deficits in the subtests of attention, executive functioning and visual spatial skills. Patient scored WFL on the clock drawing task, language and memory.   PAIN:  Are you having pain? No   FALLS: Has patient fallen in last 6 months?  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 SLP facilitated session by   Results of RBANS were provided to pt and his wife - they voiced understanding with pt stating "that has been the most frustrating part of this whole thing, thinking that I could do something (cognitively) and not being able to do it.)  Pt's visuospatial abilities were targeted thru use of small parquetry designs. Pt benefited from moderate verbal cues to complete 3 of 5 designs.   PATIENT REPORTED OUTCOME MEASURES (PROM): To be completed over the next 3 sessions   PATIENT EDUCATION: Education details: see above Person educated: Patient and Spouse Education method: Explanation Education comprehension: needs further education   HOME EXERCISE PROGRAM:   N/A   GOALS:  Goals reviewed with patient? Yes  SHORT TERM  GOALS: Target date: 10 sessions  The patient will complete a simple word search puzzle (66 or smaller) within 10 minutes given intermittent moderate verbal cues. Baseline: Goal status: INITIAL  2.  The patient will recall sentence-level information after a 5-minute delay using the spaced retrieval technique. Baseline:  Goal status: INITIAL  3.  With moderate cues, pt will identify 3 cognitive strengths/weaknesses during tasks.  Baseline:  Goal status: INITIAL  LONG TERM GOALS: Target date: 11/12/2023  The patient will read sentence-level information aloud at 80% accuracy given intermittent minimal verbal cues in order to increase ability to safely live independently. Baseline:  Goal status: INITIAL  2.   With Min A, patient will use external aid for functional recall of completed/upcoming activities 75% accuracy.     Baseline:  Goal status: INITIAL  3.   With Min A, patient will complete semi-complex visual memory tasks with 75% accuracy independently.   Baseline:  Goal status: INITIAL   ASSESSMENT:  CLINICAL IMPRESSION: Patient is a 73 y.o. right handed male who was seen today for a cognitive communication treatment d/t right MCA CVA. Pt presents with overall  moderate cognitive impairment with severe deficits in delayed memory, severe deficits in visuospatial/constructional abilities (apart from left inattention), decreased awareness of deficits/errors and mild deficits in higher  level attention. Immediate memory, language and selective attention are relative strengths for pt.   See the above treatment note for details.   OBJECTIVE IMPAIRMENTS include attention, memory, awareness, and executive functioning. These impairments are limiting patient from return to work, managing medications, managing appointments, managing finances, household responsibilities, and ADLs/IADLs. Factors affecting potential to achieve goals and functional outcome are severity of impairments. Patient  will benefit from skilled SLP services to address above impairments and improve overall function.  REHAB POTENTIAL: Good  PLAN: SLP FREQUENCY: 1-2x/week  SLP DURATION: 12 weeks  PLANNED INTERVENTIONS: Cognitive reorganization, Internal/external aids, Functional tasks, SLP instruction and feedback, Compensatory strategies, and Patient/family education   Ozetta Flatley B. Garlin Junker, M.S., CCC-SLP, Tree surgeon Certified Brain Injury Specialist [facility]  [facility] Services Office [phone] Ascom [phone] Fax [phone]

## 2023-08-26 ENCOUNTER — Ambulatory Visit

## 2023-08-26 ENCOUNTER — Ambulatory Visit: Admitting: Speech Pathology

## 2023-08-26 ENCOUNTER — Ambulatory Visit: Attending: Physician Assistant | Admitting: Physical Therapy

## 2023-08-26 DIAGNOSIS — R262 Difficulty in walking, not elsewhere classified: Secondary | ICD-10-CM | POA: Insufficient documentation

## 2023-08-26 DIAGNOSIS — M6281 Muscle weakness (generalized): Secondary | ICD-10-CM

## 2023-08-26 DIAGNOSIS — R269 Unspecified abnormalities of gait and mobility: Secondary | ICD-10-CM | POA: Diagnosis present

## 2023-08-26 DIAGNOSIS — R41841 Cognitive communication deficit: Secondary | ICD-10-CM

## 2023-08-26 DIAGNOSIS — R278 Other lack of coordination: Secondary | ICD-10-CM | POA: Diagnosis present

## 2023-08-26 DIAGNOSIS — H539 Unspecified visual disturbance: Secondary | ICD-10-CM | POA: Insufficient documentation

## 2023-08-26 DIAGNOSIS — R2681 Unsteadiness on feet: Secondary | ICD-10-CM | POA: Insufficient documentation

## 2023-08-26 DIAGNOSIS — I69354 Hemiplegia and hemiparesis following cerebral infarction affecting left non-dominant side: Secondary | ICD-10-CM | POA: Diagnosis present

## 2023-08-26 NOTE — Therapy (Signed)
 OUTPATIENT SPEECH LANGUAGE PATHOLOGY  COGNITION TREATMENT NOTE   Patient Name: [name]. MRN: [id number] DOB:May 13, 1950, 73 y.o., male Today's Date: 08/26/2023  PCP: Glenora Laos, MD REFERRING PROVIDER: Georjean Kite, PA-C   End of Session - 08/26/23 1356     Visit Number 3    Number of Visits 25    Date for SLP Re-Evaluation 11/12/23    Authorization Type [employer]    Authorization Time Period 08/20/2023 thru 10/15/2023    Authorization - Visit Number 3    Authorization - Number of Visits 16    Progress Note Due on Visit 10    SLP Start Time 1400    SLP Stop Time  1445    SLP Time Calculation (min) 45 min    Activity Tolerance Patient tolerated treatment well             Past Medical History:  Diagnosis Date   Anemia    Cervical myelopathy (HCC)    Chronic venous insufficiency    [name] (chronic kidney disease), stage III (HCC)    Diabetes mellitus without complication (HCC)    GERD (gastroesophageal reflux disease)    Hypercholesteremia    Hypothyroidism    Kidney stones    about 50 in the past--last in March 4/24   Leg weakness, bilateral    due to Myasthenia Gravis   Myasthenia gravis (HCC)    Spinal stenosis    Vitamin B 12 deficiency    Vitamin B 12 deficiency    Wears hearing aid    bilateral   Past Surgical History:  Procedure Laterality Date   BACK SURGERY  09/2015   CARDIAC CATHETERIZATION     CATARACT EXTRACTION W/ INTRAOCULAR LENS IMPLANT     CATARACT EXTRACTION W/PHACO Right 12/21/2015   Procedure: CATARACT EXTRACTION PHACO AND INTRAOCULAR LENS PLACEMENT (IOC);  Surgeon: Annell Kidney, MD;  Location: [facility];  Service: Ophthalmology;  Laterality: Right;  DIABETIC - insulin  and oral meds   COLONOSCOPY     SHOULDER SURGERY     labrum repair   TONSILLECTOMY     Patient Active Problem List   Diagnosis Date Noted   High blood pressure 08/13/2023   Anemia 08/13/2023   Constipation 08/13/2023    Myasthenia gravis (HCC) 08/13/2023   Acute ischemic right MCA stroke (HCC) 07/19/2023   Lymphedema 12/27/2016   Leg pain 07/15/2016   Chronic venous insufficiency 07/15/2016   Swelling of limb 07/15/2016   Type 2 diabetes mellitus (HCC) 07/15/2016   Hyperlipidemia 07/15/2016    ONSET DATE: 07/19/2023   REFERRING DIAG: X52.841 (ICD-10-CM) - Cerebral infarction due to unspecified occlusion or stenosis of right middle cerebral artery  THERAPY DIAG:  Cognitive communication deficit  Rationale for Evaluation and Treatment Rehabilitation  SUBJECTIVE:   PERTINENT HISTORY and DIAGNOSTIC FINDINGS:   Mr. Kinn is a 73 y.o. 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  CT Brain w/o contrast: 07/14/2023 New hypodensity within the right periventricular white matter extending into the inferior frontal lobe/operculum compatible with a recent infarct.   MRI Brain w/o contrast: 07/15/2023 IMPRESSION:  Acute right MCA distribution infarct involving the right periventricular white matter extending into the right frontal and parietal lobes and insula , some of which correlates to the recent CT finding. No significant associated mass effect or associated hemorrhage.   MRA  head angiogram 07/16/2023 IMPRESSION:  Focal occlusion of the right inferior division distal M2 MCA (6:123) in keeping with known right MCA infarct when compared to prior MRI brain 07/15/2023.   CT head 07/17/2023 IMPRESSION:  Expansion of territory involved in the right MCA infarct compared with MRI of 07/15/2023. Increasing local mass effect with new leftward midline shift of 3 mm. No hemorrhage.   Speech Language Evaluation at [facility] 07/19/2023 Patient presents with cognitive deficits in the areas of problem solving and left  inattention as evidenced throughout patient/family interview and performance on Cognistat evaluation. Patient further demonstrates impairments in the areas of intellectual, anticipatory, and emergent awareness of deficits/mistakes.   Speech Language Evaluation at [facility] 07/31/2023 Skilled therapy session focused on re-evaluation of cognitive skills utilizing the CLQT standardized assessment. Patient scored with overall mild cognitive deficits in the subtests of attention, executive functioning and visual spatial skills. Patient scored WFL on the clock drawing task, language and memory.   PAIN:  Are you having pain? No   FALLS: Has patient fallen in last 6 months?  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 SLP facilitated session by   Reports that progress has been slow "I am use to getting up and going to my computer when I want to get on it"  Pt's visuospatial abilities were targeted thru use of small parquetry designs. Pt with increased difficulty when recreating designs below the printed design. Specifically, pt with difficulty locating pieces within pile of tiles, problem solving and orienting tiles, frequently reworking pieces that were placed correctly. Functionally he and his wife report good visuospatial abilities with ADLs - assignment given targeting cel phone and computer skills - including finding weather app and telling his wife what  the high temperature will be on specifically requested day/date, requesting that he text specific people messages (they report that he sent a message to the wrong person) having pt locate and read requested phone numbers to his wife.     PATIENT REPORTED OUTCOME MEASURES (PROM): To be completed over the next 3 sessions   PATIENT EDUCATION: Education details: see above Person educated: Patient and Spouse Education method: Explanation Education comprehension: needs further education   HOME EXERCISE PROGRAM:   See above for details    GOALS:  Goals reviewed with patient? Yes  SHORT TERM GOALS: Target date: 10 sessions  The patient will complete a simple word search puzzle (66 or smaller) within 10 minutes given intermittent moderate verbal cues. Baseline: Goal status: INITIAL  2.  The patient will recall sentence-level information after a 5-minute delay using the spaced retrieval technique. Baseline:  Goal status: INITIAL  3.  With moderate cues, pt will identify 3 cognitive strengths/weaknesses during tasks.  Baseline:  Goal status: INITIAL  LONG TERM GOALS: Target date: 11/12/2023  The patient will read sentence-level information aloud at 80% accuracy given intermittent minimal verbal cues in order to increase ability to safely live independently. Baseline:  Goal status: INITIAL  2.   With Min A, patient will use external  aid for functional recall of completed/upcoming activities 75% accuracy.     Baseline:  Goal status: INITIAL  3.   With Min A, patient will complete semi-complex visual memory tasks with 75% accuracy independently.   Baseline:  Goal status: INITIAL   ASSESSMENT:  CLINICAL IMPRESSION: Patient is a 73 y.o. right handed male who was seen today for a cognitive communication treatment d/t right MCA CVA. Pt presents with overall  moderate cognitive impairment with severe deficits in delayed memory, severe deficits in visuospatial/constructional  abilities (apart from left inattention), decreased awareness of deficits/errors and mild deficits in higher level attention. Immediate memory, language and selective attention are relative strengths for pt.   See the above treatment note for details.   OBJECTIVE IMPAIRMENTS include attention, memory, awareness, and executive functioning. These impairments are limiting patient from return to work, managing medications, managing appointments, managing finances, household responsibilities, and ADLs/IADLs. Factors affecting potential to achieve goals and functional outcome are severity of impairments. Patient will benefit from skilled SLP services to address above impairments and improve overall function.  REHAB POTENTIAL: Good  PLAN: SLP FREQUENCY: 1-2x/week  SLP DURATION: 12 weeks  PLANNED INTERVENTIONS: Cognitive reorganization, Internal/external aids, Functional tasks, SLP instruction and feedback, Compensatory strategies, and Patient/family education   [place] Whedbee B. Garlin Junker, M.S., CCC-SLP, Tree surgeon Certified Brain Injury Specialist [facility]  [facility] Office [phone] Ascom [phone] Fax [phone]

## 2023-08-26 NOTE — Therapy (Addendum)
 OUTPATIENT PHYSICAL THERAPY NEURO TREATMENT   Patient Name: [name]. MRN: [id number] DOB:12/06/1950, 73 y.o., male Today's Date: 08/26/2023   PCP: Glenora Laos, MD  REFERRING PROVIDER:    Sterling Eisenmenger, PA-C    END OF SESSION:   PT End of Session - 08/26/23 1532     Visit Number 3    Number of Visits 24    Date for PT Re-Evaluation 11/12/23    PT Start Time 1534    PT Stop Time 1615    PT Time Calculation (min) 41 min    Equipment Utilized During Treatment Gait belt    Activity Tolerance Patient tolerated treatment well    Behavior During Therapy WFL for tasks assessed/performed               Past Medical History:  Diagnosis Date   Anemia    Cervical myelopathy (HCC)    Chronic venous insufficiency    [name] (chronic kidney disease), stage III (HCC)    Diabetes mellitus without complication (HCC)    GERD (gastroesophageal reflux disease)    Hypercholesteremia    Hypothyroidism    Kidney stones    about 50 in the past--last in March 4/24   Leg weakness, bilateral    due to Myasthenia Gravis   Myasthenia gravis (HCC)    Spinal stenosis    Vitamin B 12 deficiency    Vitamin B 12 deficiency    Wears hearing aid    bilateral   Past Surgical History:  Procedure Laterality Date   BACK SURGERY  09/2015   CARDIAC CATHETERIZATION     CATARACT EXTRACTION W/ INTRAOCULAR LENS IMPLANT     CATARACT EXTRACTION W/PHACO Right 12/21/2015   Procedure: CATARACT EXTRACTION PHACO AND INTRAOCULAR LENS PLACEMENT (IOC);  Surgeon: Annell Kidney, MD;  Location: [facility];  Service: Ophthalmology;  Laterality: Right;  DIABETIC - insulin  and oral meds   COLONOSCOPY     SHOULDER SURGERY     labrum repair   TONSILLECTOMY     Patient Active Problem List   Diagnosis Date Noted   High blood pressure 08/13/2023   Anemia 08/13/2023   Constipation 08/13/2023   Myasthenia gravis (HCC) 08/13/2023   Acute ischemic right MCA stroke (HCC) 07/19/2023    Lymphedema 12/27/2016   Leg pain 07/15/2016   Chronic venous insufficiency 07/15/2016   Swelling of limb 07/15/2016   Type 2 diabetes mellitus (HCC) 07/15/2016   Hyperlipidemia 07/15/2016    ONSET DATE:   REFERRING DIAG:  Diagnosis  I63.511 (ICD-10-CM) - Cerebral infarction due to unspecified occlusion or stenosis of right middle cerebral artery    THERAPY DIAG:  Muscle weakness (generalized)  Other lack of coordination  Abnormality of gait and mobility  Hemiplegia and hemiparesis following cerebral infarction affecting left non-dominant side (HCC)  Rationale for Evaluation and Treatment: Rehabilitation  SUBJECTIVE:  SUBJECTIVE STATEMENT:  Pt reports he has been doing his transfers with no other issues since last visit.  Pt states there has been one time where he didn't make sure his chair and his feet were in the right spot before he tried to stand up, so he had to sit back down and reset.    Denies pain to start. Pt states he felt great after last therapy session, reporting he didn't feel too tired.   Pt states he hasn't hit any more doorways in his PWC since taking the door facings off. Reports [employer] is sending someone out to assess the drive sensitivity of his PWC on Wednesday.   Pt wearing L swedish knee cage. Pt's wife brought pt's personal L hand splint for RW to use during session.  Patient reports he has not had an MG exacerbation since 2016 or 2017 and is not concerned that working at an increased intensity during therapy will cause a flair.  Pt reports his goal is to learn to walk again if at all possible, even if it is only 10 feet, and he wants to focus on walking during every PT session.   From Initial Eval: Pt reports that he is doing well. Has not been moving much since  discharging from CIR, as therapists instructed pt to perform transfers only at home due to high fall risk with ambulation. Continues to have significant weakenss in the LUE with difficulty extending fingers as well as numbness and inattention to the L side of body resulting in multiple cuts on Arm and leg with WC mobility in home.  Will be attaining custom power WC from [employer]. Has loaner currently.   Pt accompanied by: significant other [name]   PERTINENT HISTORY: [name]. is a 73 y.o. male with history of T2DM, HTN, [name], renal calculi, seropositive myasthenia gravis who was admitted to [facility] on 07/14/2023 with reports 3 to 4-day history of stuttering speech and fluctuating LUE weakness followed by fall.  He reported some double vision and per discussion with his neurology I had increased prednisone  to 10 mg/day due to concerns of MG flare.  He was found to have left facial weakness with dysarthria as well as LUE and LLE weakness.  MRI brain done revealing acute right MCA infarction involving right periventricular white matter and extending into right frontal and parietal lobes.    [name]. was admitted to rehab 07/19/2023 for inpatient therapies to consist of PT, ST and OT at least three hours five days a week. Past admission physiatrist, therapy team and rehab RN have worked together to provide customized collaborative inpatient rehab.  He continues on low-dose aspirin  and subcu Lovenox  was used for DVT prophylaxis during his stay.  His blood pressures were monitored on TID basis and has been stable on current regimen.  Serial check of CBC shows H&H to be relatively stable stool guaiac was negative x 1.  He continues to have lower extremity edema and was educated on importance of elevation for edema control.  Constipation has resolved with sorbitol  use on intermittent basis.  P.o. intake has been good.  He was noted to have Candida crura's which was treated with course of Diflucan.   MASD has resolved with use of Gerhardt's Butt cream.   Left hemiplegia as been a limiting factor with left knee pain noted with increase in activity.  EKG was added for support and pain management.  His diabetes has been monitored with ac/hs CBG checks  and SSI was use prn for tighter BS control.  Metformin  was resumed at 5 mg twice daily and basal insulin  was slowly titrated upwards to 26 units.  Blood sugars continue to be variable and he was advised to increase metformin  to home dose and titrate insulin  glargine slowly if blood sugars range over 150.  He has made steady gains during his stay and continues to be limited by mild left inattention with left knee plegia and weakness. He has been evaluated for  speciality wheelchair by [employer]. He will continue to receive follow up outpatient PT, OT and ST at [facility] after discharge.   PAIN:  Are you having pain? No and reports Numbness in the L H and and Leg   PRECAUTIONS: Fall  RED FLAGS: Hx of MG   WEIGHT BEARING RESTRICTIONS: No  FALLS: Has patient fallen in last 6 months? Yes. Number of falls 1  LIVING ENVIRONMENT: Lives with: lives with their spouse Lives in: House/apartment Stairs: Ramps installed while in the Hospital  Has following equipment at home: Otho Blitz - 2 wheeled and Wheelchair (power)  PLOF: Independent with basic ADLs, Independent with household mobility without device, and Independent with community mobility with device with [facility]   PATIENT GOALS: relearn to Walk 15-42ft.   OBJECTIVE:  Note: Objective measures were completed at Evaluation unless otherwise noted.  DIAGNOSTIC FINDINGS: MRI brain done revealing acute right MCA infarction involving right periventricular white matter and extending into right frontal and parietal lobes.  COGNITION: Overall cognitive status: Impaired poor awareness of L side while dual tasking.    SENSATION: Light touch: Impaired   COORDINATION: Limited ROM and strength on  the LUE limiting finger to nose.  ROM and strength deficits limit ankle to knee.   EDEMA:  Mild edema in the L UE.   MUSCLE TONE: LLE: Mild    POSTURE: flexed trunk. Lateral lean to the L   LOWER EXTREMITY ROM:     Active  Right Eval Left Eval  Hip flexion    Hip extension    Hip abduction    Hip adduction    Hip internal rotation    Hip external rotation    Knee flexion    Knee extension    Ankle dorsiflexion    Ankle plantarflexion    Ankle inversion    Ankle eversion     (Blank rows = not tested)  LOWER EXTREMITY MMT:    MMT Right Eval Left Eval  Hip flexion 4+ 4-  Hip extension 4+ 3+  Hip abduction 4+ 3+  Hip adduction 4+ 3+  Hip internal rotation    Hip external rotation    Knee flexion 5 4-  Knee extension 5 4-  Ankle dorsiflexion 5 4  Ankle plantarflexion    Ankle inversion    Ankle eversion    (Blank rows = not tested)  BED MOBILITY:  Findings: Sit to supine Mod A and Max A Supine to sit Mod A and Max A  TRANSFERS: Sit to stand: Min A and Mod A  Assistive device utilized: Environmental consultant - 2 wheeled     Stand to sit: Min A  Assistive device utilized: Environmental consultant - 2 wheeled     Chair to chair: Min A and Mod A  Assistive device utilized: Environmental consultant - 2 wheeled      PT required to stabilize the LUE on RW as well as provide max cues for attention to hand to prevent sliding off hand grip.  At  home Pt utilizes hand splint for stability on RW to prevent loss of grip in transfers.   RAMP:  Not tested  CURB:  Not tested  STAIRS: Not tested GAIT: Findings: Gait Characteristics: step to pattern, decreased hip/knee flexion- Left, Left foot flat, ataxic, lateral lean- Left, wide BOS, and poor foot clearance- Left, Distance walked: 37ft, Assistive device utilized:Walker - 2 wheeled, Level of assistance: Mod A and Max A, and Comments: poor awareness of lateral/anterior L LOB as well as poor awareness of LUE causing loos of grip on RW and heavy lateral LOB to the L, +2  assist required to prevent fall and position chair under patient as he attempted turn at 63ft.    FUNCTIONAL TESTS:  5 times sit to stand: 1:29 min Timed up and go (TUG): unable to complete turn at 32ft.  10 meter walk test: unable to complete on this day.   PATIENT SURVEYS:  Stroke Impact Scale 49/80                                                                                                                              TREATMENT DATE: 08/26/2023  Pt wearing L swedish knee cage. Pt's wife brought pt's personal L hand splint to use on bari-RW during session.  Sit>stand from hospital wheelchair>bari-RW with L hand splint with min A as pt demos improved anterior trunk lean/weight shift when rising to stand. Pt continues to require manual assist/facilitation to position L LE prior to initiating coming to stand, especially from the low seat height of this w/c. Cuing for pt to push-up with R hand from w/c armrest. **requires +2 assist to hold hospital wheelchair for safety during transfers*  R stand pivot w/c>EOM using bari-RW with min A for safety and pt requiring less cuing to keep AD close to him when turning. L stand pivot back to w/c as just described.   Repeated sit<>stands from partially elevated EOM x10 reps using bari-RW with L hand splint and min A for safety/balance - pt demos good ability to power into standing, occasionally his L foot will move when coming to stand due to decreased proprioception, but doesn't cause significant instability. Used mirror feedback for improved midline orientation; however, in this simple task pt doesn't demonstrate the strong L lean that he has during gait.  Gait training 2x ~30ft using bari-RW with L hand splint and heavy min progressed to mod assist for balance due to impaired midline orientation with progressively stronger L lean. Requires +2 w/c follow for safety . Pt demonstrating the following gait deviations with therapist providing the described  cuing and facilitation for improvement:  Utilized mirror feedback in hallway down straight pathway to improve pt's midline orientation and decrease L lean Improved L LE foot placement during swing advancement today with decreased hip external rotation and hip adduction, until pt becomes fatigued towards end of the gait trials and these deviations become more prominent Continues w/ impaired proprioceptive/sensory awareness  of L foot location  L knee continues to have quick extension movement into hyperextension into swedish knee cage during stance due to impaired timing of L glute/quad activation and wt shifting onto L LE during stance Pt lacks forward progression of pelvis over L stance phase Continues to have strong L lean bias that worsens with fatigue over increased distance Repeated cuing and facilitation for increased R wt shift during R stance Repeated assist/facilitation to adjust AD to account for this lean, but not as significant as last session Potential pushing syndrome contributing to L lean 1x strong R LOB requiring +2 mod A to maintain upright and regain balance to safely sit back in w/c  Pt states his goal is to walk as much as possible to see if he can regain any independent ambulatory abilities, even if it is walking only 10 ft.  PATIENT EDUCATION: Education details: POC. Goals.  Person educated: Patient and Spouse Education method: Explanation Education comprehension: verbalized understanding  HOME EXERCISE PROGRAM:  To be provided at upcoming visits  GOALS: Goals reviewed with patient? Yes  SHORT TERM GOALS: Target date: 09/25/2023    Patient will be independent in home exercise program to improve strength/mobility for better functional independence with ADLs. Baseline: To be provided at upcoming visits Goal status: INITIAL   LONG TERM GOALS: Target date: 11/13/2023    Patient will increase SIS-16 score to equal to or greater than 10points    to demonstrate  statistically significant improvement in mobility and quality of life.  Baseline: 49/80 Goal status: INITIAL  2.  Patient (> 17 years old) will complete five times sit to stand test in < 15 seconds indicating an increased LE strength and improved balance. Baseline: 1:32min Goal status: INITIAL  3.  Patient will increase FIST score by > 6 points to demonstrate decreased fall risk during functional activities Baseline:  43/56  Goal status: INITIAL  4.  Patient will increase 10 meter walk test to >0.7m/s as to improve gait speed for better community ambulation and to reduce fall risk. Baseline: unable to complete  Goal status: INITIAL  5.  Patient will reduce timed up and go to <11 seconds to reduce fall risk and demonstrate improved transfer/gait ability. Baseline: unable to complete turn at 10 ft. Mod-max assist due to poor control of LW on RW, resulting in L lateral LOB. Goal status: INITIAL   ASSESSMENT:  CLINICAL IMPRESSION:  Patient is a 73 y.o. male who was seen today for physical therapy treatment for balance, coordination, and gait deficits following CVA in April. Pt has hx of MG and was initially treated for MG exacerbation, but was then found to have CVA affecting LUE>LLE as well as poor awareness of L side. Patient's wife again brought in pt's L RW hand splint for use during session allowing increased participation in standing and gait training because pt with significant L hemibody inattention/impaired proprioception impacting his awareness and coordination of that side. Pt reports [employer] is coming on Wednesday to assess the drive sensitivity of his PWC to increase his safety with maneuvering in the home. Patient able to participate in increased gait training distance today using bari-RW with skilled heavy min/mod A due to impaired midline orientation with strong L lean, impaired L LE proprioception and coordination, and generalized weakness with impaired endurance (requires +2  w/c follow for safety). Patient demonstrates improvement in midline orientation and L LE coordination/positioning during gait when using mirror feedback today. Pt will benefit from skilled PT to address  strength, balance, coordination, attention deficits to improve function and overall QoL and return to PLOF.   OBJECTIVE IMPAIRMENTS: Abnormal gait, cardiopulmonary status limiting activity, decreased activity tolerance, decreased balance, decreased cognition, decreased coordination, decreased endurance, decreased knowledge of condition, decreased knowledge of use of DME, decreased mobility, difficulty walking, decreased ROM, decreased strength, decreased safety awareness, dizziness, hypomobility, increased fascial restrictions, impaired perceived functional ability, increased muscle spasms, impaired sensation, impaired tone, impaired UE functional use, impaired vision/preception, improper body mechanics, postural dysfunction, and obesity.   ACTIVITY LIMITATIONS: carrying, lifting, bending, sitting, standing, squatting, stairs, transfers, bed mobility, bathing, toileting, dressing, reach over head, hygiene/grooming, locomotion level, and caring for others  PARTICIPATION LIMITATIONS: meal prep, cleaning, laundry, medication management, interpersonal relationship, driving, shopping, community activity, and yard work  PERSONAL FACTORS: Age, Past/current experiences, Time since onset of injury/illness/exacerbation, and 3+ comorbidities: MG, HTN, CVA, lymphedema are also affecting patient's functional outcome.   REHAB POTENTIAL: Good  CLINICAL DECISION MAKING: Unstable/unpredictable  EVALUATION COMPLEXITY: High  PLAN:  PT FREQUENCY: 1-2x/week  PT DURATION: 12 weeks  PLANNED INTERVENTIONS: 97164- PT Re-evaluation, 97750- Physical Performance Testing, 97110-Therapeutic exercises, 97530- Therapeutic activity, [id number]- Neuromuscular re-education, 97535- Self Care, 13244- Manual therapy, [id number]- Gait  training, [phone]- Orthotic Initial, [phone]- Orthotic/Prosthetic subsequent, [phone]- Canalith repositioning, [id number]- Electrical stimulation (unattended), [phone]- Electrical stimulation (manual), [id number]- Wound care (first 20 sq cm), 97598- Wound care (each additional 20 sq cm), Patient/Family education, Balance training, Stair training, Taping, Dry Needling, Joint mobilization, Joint manipulation, Vestibular training, Visual/preceptual remediation/compensation, Cognitive remediation, DME instructions, Wheelchair mobility training, Cryotherapy, and Moist heat  PLAN FOR NEXT SESSION:  Gait training with L hand splint on bari-RW vs use of litegait Work on midline orientation and R weight shifting Work on L LE coordination and strength for improved swing phase advancement Provide HEP Complete when appropriate   Ahleah Simko, PT, DPT, NCS, CSRS Physical Therapist - [facility]  4:18 PM 08/26/23

## 2023-08-28 ENCOUNTER — Ambulatory Visit: Admitting: Physical Therapy

## 2023-08-28 ENCOUNTER — Ambulatory Visit: Admitting: Speech Pathology

## 2023-08-28 ENCOUNTER — Ambulatory Visit: Admitting: Occupational Therapy

## 2023-08-28 DIAGNOSIS — M6281 Muscle weakness (generalized): Secondary | ICD-10-CM | POA: Diagnosis not present

## 2023-08-28 DIAGNOSIS — R278 Other lack of coordination: Secondary | ICD-10-CM

## 2023-08-28 DIAGNOSIS — R269 Unspecified abnormalities of gait and mobility: Secondary | ICD-10-CM

## 2023-08-28 DIAGNOSIS — I69354 Hemiplegia and hemiparesis following cerebral infarction affecting left non-dominant side: Secondary | ICD-10-CM

## 2023-08-28 DIAGNOSIS — R41841 Cognitive communication deficit: Secondary | ICD-10-CM

## 2023-08-28 DIAGNOSIS — H539 Unspecified visual disturbance: Secondary | ICD-10-CM

## 2023-08-28 NOTE — Therapy (Signed)
 OUTPATIENT SPEECH LANGUAGE PATHOLOGY  COGNITION TREATMENT NOTE   Patient Name: [name]. MRN: [id number] DOB:1951/02/20, 73 y.o., male Today's Date: 08/28/2023  PCP: Douglas Laos, MD REFERRING PROVIDER: Georjean Kite, PA-C   End of Session - 08/28/23 1143     Visit Number 4    Number of Visits 25    Date for SLP Re-Evaluation 11/12/23    Authorization Type [employer]    Authorization Time Period 08/20/2023 thru 10/15/2023    Authorization - Visit Number 4    Authorization - Number of Visits 16    Progress Note Due on Visit 10    SLP Start Time 1145    SLP Stop Time  1230    SLP Time Calculation (min) 45 min    Activity Tolerance Patient tolerated treatment well             Past Medical History:  Diagnosis Date   Anemia    Cervical myelopathy (HCC)    Chronic venous insufficiency    [name] (chronic Wright disease), stage III (HCC)    Diabetes mellitus without complication (HCC)    GERD (gastroesophageal reflux disease)    Hypercholesteremia    Hypothyroidism    Wright stones    about 50 in the past--last in March 4/24   Leg weakness, bilateral    due to Myasthenia Gravis   Myasthenia gravis (HCC)    Spinal stenosis    Vitamin B 12 deficiency    Vitamin B 12 deficiency    Wears hearing aid    bilateral   Past Surgical History:  Procedure Laterality Date   BACK SURGERY  09/2015   CARDIAC CATHETERIZATION     CATARACT EXTRACTION W/ INTRAOCULAR LENS IMPLANT     CATARACT EXTRACTION W/PHACO Right 12/21/2015   Procedure: CATARACT EXTRACTION PHACO AND INTRAOCULAR LENS PLACEMENT (IOC);  Surgeon: Douglas Kidney, MD;  Location: [facility];  Service: Ophthalmology;  Laterality: Right;  DIABETIC - insulin  and oral meds   COLONOSCOPY     SHOULDER SURGERY     labrum repair   TONSILLECTOMY     Patient Active Problem List   Diagnosis Date Noted   High blood pressure 08/13/2023   Anemia 08/13/2023   Constipation 08/13/2023    Myasthenia gravis (HCC) 08/13/2023   Acute ischemic right MCA stroke (HCC) 07/19/2023   Lymphedema 12/27/2016   Leg pain 07/15/2016   Chronic venous insufficiency 07/15/2016   Swelling of limb 07/15/2016   Type 2 diabetes mellitus (HCC) 07/15/2016   Hyperlipidemia 07/15/2016    ONSET DATE: 07/19/2023   REFERRING DIAG: N82.956 (ICD-10-CM) - Cerebral infarction due to unspecified occlusion or stenosis of right middle cerebral artery  THERAPY DIAG:  Cognitive communication deficit  Rationale for Evaluation and Treatment Rehabilitation  SUBJECTIVE:   PERTINENT HISTORY and DIAGNOSTIC FINDINGS:   [name] is a 73 y.o. 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  CT Brain w/o contrast: 07/14/2023 New hypodensity within the right periventricular white matter extending into the inferior frontal lobe/operculum compatible with a recent infarct.   MRI Brain w/o contrast: 07/15/2023 IMPRESSION:  Acute right MCA distribution infarct involving the right periventricular white matter extending into the right frontal and parietal lobes and insula , some of which correlates to the recent CT finding. No significant associated mass effect or associated hemorrhage.   MRA  head angiogram 07/16/2023 IMPRESSION:  Focal occlusion of the right inferior division distal M2 MCA (6:123) in keeping with known right MCA infarct when compared to prior MRI brain 07/15/2023.   CT head 07/17/2023 IMPRESSION:  Expansion of territory involved in the right MCA infarct compared with MRI of 07/15/2023. Increasing local mass effect with new leftward midline shift of 3 mm. No hemorrhage.   Speech Language Evaluation at [facility] 07/19/2023 Patient presents with cognitive deficits in the areas of problem solving and left  inattention as evidenced throughout patient/family interview and performance on Cognistat evaluation. Patient further demonstrates impairments in the areas of intellectual, anticipatory, and emergent awareness of deficits/mistakes.   Speech Language Evaluation at [facility] 07/31/2023 Skilled therapy session focused on re-evaluation of cognitive skills utilizing the CLQT standardized assessment. Patient scored with overall mild cognitive deficits in the subtests of attention, executive functioning and visual spatial skills. Patient scored WFL on the clock drawing task, language and memory.   PAIN:  Are you having pain? No   FALLS: Has patient fallen in last 6 months?  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 SLP facilitated session by   Reports that he sent several text message and wife looked over them, went well  Brought in list forwarded some emails, and his wife reports that he was able to locate various specifically requested information without any incidences of visuospatial difficulty  When this writer requested pt to locate apps/information on his phone, he was able to enter pass code and locate information correctly. Pt also able to sort by shapes but reports that he has always "hated" puzzles and had never been good at them even before the CVA. Suspect this is reason pt was not successful in recreating  parquetry designs.  Skilled verbal education provided on utilize alarm on clock to remind pt to perform hand exercises to help with swelling. With intermittent Min A to supervision support, pt able to successful set 4 alarms with label    PATIENT REPORTED OUTCOME MEASURES (PROM): To be completed over the next 3 sessions   PATIENT EDUCATION: Education details: see above Person educated: Patient and Spouse Education method: Explanation Education comprehension: needs further education   HOME EXERCISE PROGRAM:   See above for details    GOALS:  Goals reviewed with patient? Yes  SHORT TERM GOALS: Target date: 10 sessions  The patient will complete a simple word search puzzle (66 or smaller) within 10 minutes given intermittent moderate verbal cues. Baseline: Goal status: INITIAL  2.  The patient will recall sentence-level information after a 5-minute delay using the spaced retrieval technique. Baseline:  Goal status: INITIAL  3.  With moderate cues, pt will identify 3 cognitive strengths/weaknesses during tasks.  Baseline:  Goal status: INITIAL  LONG TERM GOALS: Target date: 11/12/2023  The patient will read sentence-level information aloud at 80% accuracy given intermittent minimal verbal cues in order to increase ability to safely live independently. Baseline:  Goal status: INITIAL  2.   With Min A, patient will use external aid for functional recall of completed/upcoming activities 75% accuracy.     Baseline:  Goal status: INITIAL  3.   With Min A, patient will complete semi-complex visual memory tasks with 75% accuracy independently.   Baseline:  Goal status: INITIAL   ASSESSMENT:  CLINICAL IMPRESSION: Patient is a 73 y.o. right handed male who was seen today for a cognitive communication treatment d/t right MCA CVA. Pt presents with overall  moderate cognitive impairment with severe deficits in delayed memory, severe deficits in visuospatial/constructional  abilities (apart from left inattention), decreased awareness of deficits/errors and mild deficits in higher level attention. Immediate memory, language and selective attention are relative strengths for pt.   See the above treatment note for details.   OBJECTIVE IMPAIRMENTS include attention, memory, awareness, and executive functioning. These impairments are limiting patient from return to work, managing medications, managing appointments, managing finances, household responsibilities, and ADLs/IADLs. Factors affecting potential to achieve goals and functional outcome are severity of impairments. Patient will benefit from skilled SLP services to address above impairments and improve overall function.  REHAB POTENTIAL: Good  PLAN: SLP FREQUENCY: 1-2x/week  SLP DURATION: 12 weeks  PLANNED INTERVENTIONS: Cognitive reorganization, Internal/external aids, Functional tasks, SLP instruction and feedback, Compensatory strategies, and Patient/family education   Beila Purdie B. Garlin Junker, M.S., CCC-SLP, Tree surgeon Certified Brain Injury Specialist [facility]  [facility] Office [phone] Ascom [phone] Fax [phone]

## 2023-08-28 NOTE — Therapy (Unsigned)
 OUTPATIENT PHYSICAL THERAPY NEURO TREATMENT   Patient Name: [name]. MRN: [id number] DOB:Jan 19, 1951, 73 y.o., male Today's Date: 08/28/2023   PCP: Glenora Laos, MD  REFERRING PROVIDER:    Sterling Eisenmenger, PA-C    END OF SESSION:   PT End of Session - 08/28/23 1103     Visit Number 4    Number of Visits 24    Date for PT Re-Evaluation 11/12/23    PT Start Time 1102    PT Stop Time 1145    PT Time Calculation (min) 43 min    Equipment Utilized During Treatment Gait belt    Activity Tolerance Patient tolerated treatment well    Behavior During Therapy WFL for tasks assessed/performed               Past Medical History:  Diagnosis Date   Anemia    Cervical myelopathy (HCC)    Chronic venous insufficiency    [name] (chronic kidney disease), stage III (HCC)    Diabetes mellitus without complication (HCC)    GERD (gastroesophageal reflux disease)    Hypercholesteremia    Hypothyroidism    Kidney stones    about 50 in the past--last in March 4/24   Leg weakness, bilateral    due to Myasthenia Gravis   Myasthenia gravis (HCC)    Spinal stenosis    Vitamin B 12 deficiency    Vitamin B 12 deficiency    Wears hearing aid    bilateral   Past Surgical History:  Procedure Laterality Date   BACK SURGERY  09/2015   CARDIAC CATHETERIZATION     CATARACT EXTRACTION W/ INTRAOCULAR LENS IMPLANT     CATARACT EXTRACTION W/PHACO Right 12/21/2015   Procedure: CATARACT EXTRACTION PHACO AND INTRAOCULAR LENS PLACEMENT (IOC);  Surgeon: Annell Kidney, MD;  Location: [facility];  Service: Ophthalmology;  Laterality: Right;  DIABETIC - insulin  and oral meds   COLONOSCOPY     SHOULDER SURGERY     labrum repair   TONSILLECTOMY     Patient Active Problem List   Diagnosis Date Noted   High blood pressure 08/13/2023   Anemia 08/13/2023   Constipation 08/13/2023   Myasthenia gravis (HCC) 08/13/2023   Acute ischemic right MCA stroke (HCC) 07/19/2023    Lymphedema 12/27/2016   Leg pain 07/15/2016   Chronic venous insufficiency 07/15/2016   Swelling of limb 07/15/2016   Type 2 diabetes mellitus (HCC) 07/15/2016   Hyperlipidemia 07/15/2016    ONSET DATE:   REFERRING DIAG:  Diagnosis  I63.511 (ICD-10-CM) - Cerebral infarction due to unspecified occlusion or stenosis of right middle cerebral artery    THERAPY DIAG:  Muscle weakness (generalized)  Other lack of coordination  Vision disturbance  Hemiplegia and hemiparesis following cerebral infarction affecting left non-dominant side (HCC)  Abnormality of gait and mobility  Rationale for Evaluation and Treatment: Rehabilitation  SUBJECTIVE:  SUBJECTIVE STATEMENT:  Pt reports no falls since last PT session, but states that his LLE feels like it is weaker today and feels like it is going to buckle today.   Pt reports his goal is to learn to walk again if at all possible, even if it is only 10 feet, and he wants to focus on walking during every PT session.   From Initial Eval: Pt reports that he is doing well. Has not been moving much since discharging from [facility], as therapists instructed pt to perform transfers only at home due to high fall risk with ambulation. Continues to have significant weakenss in the LUE with difficulty extending fingers as well as numbness and inattention to the L side of body resulting in multiple cuts on Arm and leg with WC mobility in home.  Will be attaining custom power WC from [employer]. Has loaner currently.   Pt accompanied by: significant other [name]   PERTINENT HISTORY: [name]. is a 73 y.o. male with history of T2DM, HTN, [name], renal calculi, seropositive myasthenia gravis who was admitted to [facility] on 07/14/2023 with reports 3 to 4-day history of  stuttering speech and fluctuating LUE weakness followed by fall.  He reported some double vision and per discussion with his neurology I had increased prednisone  to 10 mg/day due to concerns of MG flare.  He was found to have left facial weakness with dysarthria as well as LUE and LLE weakness.  MRI brain done revealing acute right MCA infarction involving right periventricular white matter and extending into right frontal and parietal lobes.    Isabella Mason Sole. was admitted to rehab 07/19/2023 for inpatient therapies to consist of PT, ST and OT at least three hours five days a week. Past admission physiatrist, therapy team and rehab RN have worked together to provide customized collaborative inpatient rehab.  He continues on low-dose aspirin  and subcu Lovenox  was used for DVT prophylaxis during his stay.  His blood pressures were monitored on TID basis and has been stable on current regimen.  Serial check of CBC shows H&H to be relatively stable stool guaiac was negative x 1.  He continues to have lower extremity edema and was educated on importance of elevation for edema control.  Constipation has resolved with sorbitol  use on intermittent basis.  P.o. intake has been good.  He was noted to have Candida crura's which was treated with course of Diflucan.  MASD has resolved with use of Gerhardt's Butt cream.   Left hemiplegia as been a limiting factor with left knee pain noted with increase in activity.  EKG was added for support and pain management.  His diabetes has been monitored with ac/hs CBG checks and SSI was use prn for tighter BS control.  Metformin  was resumed at 5 mg twice daily and basal insulin  was slowly titrated upwards to 26 units.  Blood sugars continue to be variable and he was advised to increase metformin  to home dose and titrate insulin  glargine slowly if blood sugars range over 150.  He has made steady gains during his stay and continues to be limited by mild left inattention with left  knee plegia and weakness. He has been evaluated for  speciality wheelchair by [employer]. He will continue to receive follow up outpatient PT, OT and ST at [facility] after discharge.   PAIN:  Are you having pain? No and reports Numbness in the L H and and Leg   PRECAUTIONS: Fall  RED FLAGS: Hx of MG   WEIGHT BEARING RESTRICTIONS: No  FALLS: Has patient fallen in last 6 months? Yes. Number of falls 1  LIVING ENVIRONMENT: Lives with: lives with their spouse Lives in: House/apartment Stairs: Ramps installed while in the Hospital  Has following equipment at home: Otho Blitz - 2 wheeled and Wheelchair (power)  PLOF: Independent with basic ADLs, Independent with household mobility without device, and Independent with community mobility with device with [facility]   PATIENT GOALS: relearn to Walk 15-65ft.   OBJECTIVE:  Note: Objective measures were completed at Evaluation unless otherwise noted.  DIAGNOSTIC FINDINGS: MRI brain done revealing acute right MCA infarction involving right periventricular white matter and extending into right frontal and parietal lobes.  COGNITION: Overall cognitive status: Impaired poor awareness of L side while dual tasking.    SENSATION: Light touch: Impaired   COORDINATION: Limited ROM and strength on the LUE limiting finger to nose.  ROM and strength deficits limit ankle to knee.   EDEMA:  Mild edema in the L UE.   MUSCLE TONE: LLE: Mild    POSTURE: flexed trunk. Lateral lean to the L   LOWER EXTREMITY ROM:     Active  Right Eval Left Eval  Hip flexion    Hip extension    Hip abduction    Hip adduction    Hip internal rotation    Hip external rotation    Knee flexion    Knee extension    Ankle dorsiflexion    Ankle plantarflexion    Ankle inversion    Ankle eversion     (Blank rows = not tested)  LOWER EXTREMITY MMT:    MMT Right Eval Left Eval  Hip flexion 4+ 4-  Hip extension 4+ 3+  Hip abduction 4+ 3+  Hip  adduction 4+ 3+  Hip internal rotation    Hip external rotation    Knee flexion 5 4-  Knee extension 5 4-  Ankle dorsiflexion 5 4  Ankle plantarflexion    Ankle inversion    Ankle eversion    (Blank rows = not tested)  BED MOBILITY:  Findings: Sit to supine Mod A and Max A Supine to sit Mod A and Max A  TRANSFERS: Sit to stand: Min A and Mod A  Assistive device utilized: Environmental consultant - 2 wheeled     Stand to sit: Min A  Assistive device utilized: Environmental consultant - 2 wheeled     Chair to chair: Min A and Mod A  Assistive device utilized: Environmental consultant - 2 wheeled      PT required to stabilize the LUE on RW as well as provide max cues for attention to hand to prevent sliding off hand grip.  At home Pt utilizes hand splint for stability on RW to prevent loss of grip in transfers.   RAMP:  Not tested  CURB:  Not tested  STAIRS: Not tested GAIT: Findings: Gait Characteristics: step to pattern, decreased hip/knee flexion- Left, Left foot flat, ataxic, lateral lean- Left, wide BOS, and poor foot clearance- Left, Distance walked: 37ft, Assistive device utilized:Walker - 2 wheeled, Level of assistance: Mod A and Max A, and Comments: poor awareness of lateral/anterior L LOB as well as poor awareness of LUE causing loos of grip on RW and heavy lateral LOB to the L, +2 assist required to prevent fall and position chair under patient as he attempted turn at 74ft.    FUNCTIONAL TESTS:  5 times sit to stand: 1:29 min Timed up  and go (TUG): unable to complete turn at 40ft.  10 meter walk test: unable to complete on this day.   PATIENT SURVEYS:  Stroke Impact Scale 49/80                                                                                                                              TREATMENT DATE: 08/28/2023  Pt wearing L swedish knee cage. Pt's wife brought pt's personal L hand splint to use on bari-RW during session.  Sit<>stand from transport chair to arm chair with RW and L hand support min-mod  assist. Stand pivot to arm chair with min assist and mod-max cues for sequencing.   Sit<>stand from arm chair with min-mod assist for proper UE support, anterior weight shift and sequencing.   Pre-gait stepping with BUE support on RW x 10 bil.  Lateral stepping with BUE support on RW with L hand splint x 10 bil.  Gait with lite gait x 73ft +73ft and LUE supported on handle with acewrap. Mod-max assist from PT for improved weight shift, knee extension on the LLE, and improved step width.     PATIENT EDUCATION: Education details: POC. Goals. Benefits of gait training in body weight support system.  Person educated: Patient and Spouse Education method: Explanation Education comprehension: verbalized understanding  HOME EXERCISE PROGRAM:  To be provided at upcoming visits  GOALS: Goals reviewed with patient? Yes  SHORT TERM GOALS: Target date: 09/25/2023    Patient will be independent in home exercise program to improve strength/mobility for better functional independence with ADLs. Baseline: To be provided at upcoming visits Goal status: INITIAL   LONG TERM GOALS: Target date: 11/13/2023    Patient will increase SIS-16 score to equal to or greater than 10points    to demonstrate statistically significant improvement in mobility and quality of life.  Baseline: 49/80 Goal status: INITIAL  2.  Patient (> 65 years old) will complete five times sit to stand test in < 15 seconds indicating an increased LE strength and improved balance. Baseline: 1:36min Goal status: INITIAL  3.  Patient will increase FIST score by > 6 points to demonstrate decreased fall risk during functional activities Baseline:  43/56  Goal status: INITIAL  4.  Patient will increase 10 meter walk test to >0.67m/s as to improve gait speed for better community ambulation and to reduce fall risk. Baseline: unable to complete  Goal status: INITIAL  5.  Patient will reduce timed up and go to <11 seconds to reduce  fall risk and demonstrate improved transfer/gait ability. Baseline: unable to complete turn at 10 ft. Mod-max assist due to poor control of LW on RW, resulting in L lateral LOB. Goal status: INITIAL   ASSESSMENT:  CLINICAL IMPRESSION:  Patient is a 73 y.o. male who was seen today for physical therapy treatment for balance, coordination, and gait deficits following CVA in April. Pt has hx of MG and was initially treated for MG  exacerbation, but was then found to have CVA affecting LUE>LLE as well as poor awareness of L side. Patient's wife again brought in pt's L RW hand splint for use during session allowing increased participation in standing and gait training because pt with significant L hemibody inattention/impaired proprioception impacting his awareness and coordination of that side. Pt instructed in pregait and BWS gait training. Emphasis on improved step length and weight shift on the R side to improve midline orientation as well as cues for knee extension on the LLE in stance. Severe fatigue limiting knee extension on second bout of gait training with fatigue. Pt will benefit from skilled PT to address strength, balance, coordination, attention deficits to improve function and overall QoL and return to PLOF.   OBJECTIVE IMPAIRMENTS: Abnormal gait, cardiopulmonary status limiting activity, decreased activity tolerance, decreased balance, decreased cognition, decreased coordination, decreased endurance, decreased knowledge of condition, decreased knowledge of use of DME, decreased mobility, difficulty walking, decreased ROM, decreased strength, decreased safety awareness, dizziness, hypomobility, increased fascial restrictions, impaired perceived functional ability, increased muscle spasms, impaired sensation, impaired tone, impaired UE functional use, impaired vision/preception, improper body mechanics, postural dysfunction, and obesity.   ACTIVITY LIMITATIONS: carrying, lifting, bending, sitting,  standing, squatting, stairs, transfers, bed mobility, bathing, toileting, dressing, reach over head, hygiene/grooming, locomotion level, and caring for others  PARTICIPATION LIMITATIONS: meal prep, cleaning, laundry, medication management, interpersonal relationship, driving, shopping, community activity, and yard work  PERSONAL FACTORS: Age, Past/current experiences, Time since onset of injury/illness/exacerbation, and 3+ comorbidities: MG, HTN, CVA, lymphedema are also affecting patient's functional outcome.   REHAB POTENTIAL: Good  CLINICAL DECISION MAKING: Unstable/unpredictable  EVALUATION COMPLEXITY: High  PLAN:  PT FREQUENCY: 1-2x/week  PT DURATION: 12 weeks  PLANNED INTERVENTIONS: 97164- PT Re-evaluation, 97750- Physical Performance Testing, 97110-Therapeutic exercises, 97530- Therapeutic activity, [id number]- Neuromuscular re-education, 97535- Self Care, 16109- Manual therapy, [id number]- Gait training, [phone]- Orthotic Initial, [phone]- Orthotic/Prosthetic subsequent, [phone]- Canalith repositioning, [id number]- Electrical stimulation (unattended), [phone]- Electrical stimulation (manual), [id number]- Wound care (first 20 sq cm), 97598- Wound care (each additional 20 sq cm), Patient/Family education, Balance training, Stair training, Taping, Dry Needling, Joint mobilization, Joint manipulation, Vestibular training, Visual/preceptual remediation/compensation, Cognitive remediation, DME instructions, Wheelchair mobility training, Cryotherapy, and Moist heat  PLAN FOR NEXT SESSION:  Gait training with L hand splint on bari-RW vs use of litegait Work on midline orientation and R weight shifting Work on L LE coordination and strength for improved swing phase advancement Provide HEP Complete when appropriate   Aurora Lees PT, DPT  Physical Therapist - [facility]  [facility]  7:56 AM 08/29/23

## 2023-08-28 NOTE — Therapy (Signed)
 OUTPATIENT OCCUPATIONAL THERAPY NEURO TREATMENT  Patient Name: [name]. MRN: [id number] DOB:07/06/1950, 73 y.o., male Today's Date: 08/28/2023  PCP: Arvel Birch, MD REFERRING PROVIDER: Leotha Rang, PA-C  END OF SESSION:  OT End of Session - 08/28/23 1707     Visit Number 4    Number of Visits 24    Date for OT Re-Evaluation 11/12/23    OT Start Time 1015    OT Stop Time 1100    OT Time Calculation (min) 45 min    Activity Tolerance Patient tolerated treatment well    Behavior During Therapy WFL for tasks assessed/performed            Past Medical History:  Diagnosis Date   Anemia    Cervical myelopathy (HCC)    Chronic venous insufficiency    [name] (chronic kidney disease), stage III (HCC)    Diabetes mellitus without complication (HCC)    GERD (gastroesophageal reflux disease)    Hypercholesteremia    Hypothyroidism    Kidney stones    about 50 in the past--last in March 4/24   Leg weakness, bilateral    due to Myasthenia Gravis   Myasthenia gravis (HCC)    Spinal stenosis    Vitamin B 12 deficiency    Vitamin B 12 deficiency    Wears hearing aid    bilateral   Past Surgical History:  Procedure Laterality Date   BACK SURGERY  09/2015   CARDIAC CATHETERIZATION     CATARACT EXTRACTION W/ INTRAOCULAR LENS IMPLANT     CATARACT EXTRACTION W/PHACO Right 12/21/2015   Procedure: CATARACT EXTRACTION PHACO AND INTRAOCULAR LENS PLACEMENT (IOC);  Surgeon: Annell Kidney, MD;  Location: [facility];  Service: Ophthalmology;  Laterality: Right;  DIABETIC - insulin  and oral meds   COLONOSCOPY     SHOULDER SURGERY     labrum repair   TONSILLECTOMY     Patient Active Problem List   Diagnosis Date Noted   High blood pressure 08/13/2023   Anemia 08/13/2023   Constipation 08/13/2023   Myasthenia gravis (HCC) 08/13/2023   Acute ischemic right MCA stroke (HCC) 07/19/2023   Lymphedema 12/27/2016   Leg pain 07/15/2016   Chronic venous  insufficiency 07/15/2016   Swelling of limb 07/15/2016   Type 2 diabetes mellitus (HCC) 07/15/2016   Hyperlipidemia 07/15/2016   ONSET DATE: 07/13/2023  REFERRING DIAG: Acute ischemic R MCA CVA  THERAPY DIAG: muscle weakness (generalized), other lack of coordination, vision disturbance, hemiplegia and hemiparesis following cerebral infarction affecting left non-dominant side (HCC)   Rationale for Evaluation and Treatment: Rehabilitation  SUBJECTIVE:  SUBJECTIVE STATEMENT: Pt and spouse report that pt has gained some movement over the last week with opening his fingers.  Pt accompanied by: significant other  PERTINENT HISTORY: Pt. Was admitted to [facility] on 07/13/2023 after sustaining a CVA while on a trip to the beach. Pt. Was diagnosed with R MCA CVA. Pt. Was admitted to inpatient rehab from 07/19/2023-08/13/2023. Past medical history includes high BP, anemia, and Myasthenia Gravis.   PRECAUTIONS: None  WEIGHT BEARING RESTRICTIONS: No  PAIN:  Are you having pain? None  FALLS: Has patient fallen in last 6 months? Yes  LIVING ENVIRONMENT: Lives with: lives with their family and lives with their spouse-  Lives in: House/apartment- 2 story home and resides mostly on the 1st floor Stairs: No- Ramped entrance Has following equipment at home:  Otho Blitz, cane, Steadi, shower chair, handheld shower head, bedside commode  PLOF: Independent and Independent  with basic ADLs Independent at home   PATIENT GOALS: Would like to walk 10-20 feet each time.   OBJECTIVE:  Note: Objective measures were completed at Evaluation unless otherwise noted.  HAND DOMINANCE: Right  ADLs: Overall ADLs:  Transfers/ambulation related to ADLs: Eating: Independent, 100% R handed Grooming: independent UB Dressing: Can put on shirt independently LB Dressing: Difficulty with pants and requires assistance getting the pants on his feet, has difficulty putting on shoes and socks Toileting: Tot A for toilet  hygiene. Uses Steadi during toilet transfers. Bathing: Requires assist with using the L arm to wash the R arm. Tub Shower transfers: Roll in shower, built in shower chair, grab bars and hand held shower head.  IADLs: Shopping: Total A (Wife typically did the shopping prior to onset)   Light housekeeping: Total A (wife typically performs prior to onset) Meal Prep: Independent with light snack prep Community mobility: No driving, able to get in/out of the car Medication management: Set up assistance from his wife using a pill box (pt. Is responsible for taking medications at the correct time) Financial management: family members check behind him. Handwriting: TBD Hobbies: gardening, wood working and sports- [employer] Work history: Owns a tax business MOBILITY STATUS: Needs Assist:    POSTURE COMMENTS:   Sitting balance: Good supported sitting balance  FUNCTIONAL OUTCOME MEASURES: TBD   UPPER EXTREMITY ROM:    Active ROM Right Eval  WFL Left eval  Shoulder flexion  89(110)  Shoulder abduction  98(120)  Shoulder adduction    Shoulder extension    Shoulder internal rotation    Shoulder external rotation    Elbow flexion  120(140)  Elbow extension  -28(-10)  Wrist flexion  60(60) Rest at 60  Wrist extension  -48(42)  Wrist ulnar deviation    Wrist radial deviation    Wrist pronation    Wrist supination    (Blank rows = not tested)  L Digit flexion: 2nd:3cm (0cm) 3rd: 0cm (0cm), 4th: 0cm (0cm), 5th: 2cm (0cm)  L Digit extension: Active Gross digit extension through 10% of ROM UPPER EXTREMITY MMT:     MMT Right eval Left eval  Shoulder flexion 5 3-  Shoulder abduction 5 3-  Shoulder adduction    Shoulder extension    Shoulder internal rotation    Shoulder external rotation    Middle trapezius    Lower trapezius    Elbow flexion 5 TBD  Elbow extension 5 TBD  Wrist flexion    Wrist extension  2-  Wrist ulnar deviation    Wrist radial deviation    Wrist pronation     Wrist supination    (Blank rows = not tested)  HAND FUNCTION:   COORDINATION:   SENSATION: Sensation feels different in both hands.  L hand Light touch- impaired L hand Proprioception- impaired  EDEMA: Has had hx of edema, and came to Eval session with swelling in the L hand/wrist/forearm.   *pt. Has a laceration from his dog on the dorsal aspect of the L hand.   MUSCLE TONE: Increased tone through the UE and hand flexors.   COGNITION: Overall cognitive status: Within functional limits for tasks assessed  VISION: Subjective report: L sided inattention Baseline vision: Wears glasses for reading only Visual history: Right visual occlusion  VISION ASSESSMENT: To be assessed   PERCEPTION: TBD  PRAXIS: Impaired  TREATMENT DATE: 08/26/2023  Manual Therapy:   -Pt. tolerated retrograde massage to the Left hand for edema control 2/2 increased edema.  -Manual therapy was performed independent of, and in preparation for therapeutic Ex.    Therapeutic Exercise:  -Passive stretching performed throughout the LUE, including L shoulder all planes within pain free ranges, elbow flex/ext, forearm pron/sup, wrist flex/ext, and digit flex/ext.   -Focus on increasing range for L shoulder ER, forearm sup, and wrist ext, with review of self passive stretching techniques; pt able to return demo with min vc for technique with cues   Therapeutic Activities:  -facilitated combinations of LUE movements through diagonal patterns in preparation for self-grooming tasks with hand-over assist. -facilitated LUE functional reaching for cones moving the UE through multiple planes to place the cones on multiple tabletop surfaces. -Pt. Required support proximally      PATIENT EDUCATION: Education details:  LUE ROM, Edema control techniques, and  functional reaching tasks.  Person  educated: Patient and Spouse Education method: Explanation, Demonstration, Tactile cues, and Verbal cues Education comprehension: verbalized understanding, returned demonstration, and needs further education  HOME EXERCISE PROGRAM: Self passive stretching throughout the LUE; grasp/release activities   GOALS: Goals reviewed with patient? Yes  SHORT TERM GOALS: Target date: 10/01/2023   Pt. Will be independent with HEP for the LUE.  Baseline: Eval; no current HEP Goal status: INITIAL   LONG TERM GOALS: Target date: 11/12/2023  Pt. Will increase L shoulder flexion by 10 degrees to be able to reach up to shelves.  Baseline: Eval; L shoulder flexion is 89 (110). Goal status: INITIAL  2.  Pt. Will increase L shoulder abduction by 10 degrees to assist with underarm hygiene.  Baseline: Eval: L shoulder abduction is 98 (120).  Goal status: INITIAL  3.  Pt. Will improve L wrist extension by 10 degrees to be able to initiate anticipation of grasping for objects.  Baseline: Eval: -48 (42) Goal status: INITIAL  4.  Pt. Will improve active gross digit extension through 25% of the range to be able to release objects in his hand consistently.  Baseline: Eval: gross digit extension is 10% of the range  Goal status: INITIAL  5.  Pt. Will independently demonstrate visual compensatory strategies when navigating through environment and during tabletop tasks. Baseline: Pt. Requires consistent cuing for L sided awareness.  Goal status: INITIAL  ASSESSMENT:  CLINICAL IMPRESSION:  Pt. presents with 1+ edema in L hand. Pt. reports that wearing the edema glove is going well, however Pt. has difficulty, and requires assist donning it. Pt. requires hand-over hand assist for guidance through all joint ranges of motion in the LUE, during all diagonal patterns of movement, and with all reaching tasks. Pt continues to benefit from OT services to work on improving LUE functioning to increase engagement of  the LUE during daily task, and to provide education about compensatory strategies during ADLs/IADLs.  PERFORMANCE DEFICITS: in functional skills including ADLs, IADLs, coordination, dexterity, proprioception, sensation, edema, tone, ROM, strength, pain, Fine motor control, Gross motor control, mobility, balance, endurance, vision, and UE functional use, cognitive skills including perception, and psychosocial skills including coping strategies, environmental adaptation, and routines and behaviors.   IMPAIRMENTS: are limiting patient from ADLs, IADLs, and leisure.   CO-MORBIDITIES: may have co-morbidities  that affects occupational performance. Patient will benefit from skilled OT to address above impairments and improve overall function.  MODIFICATION OR ASSISTANCE TO COMPLETE EVALUATION: Min-Moderate modification of tasks or assist with assess necessary to complete  an evaluation.  OT OCCUPATIONAL PROFILE AND HISTORY: Detailed assessment: Review of records and additional review of physical, cognitive, psychosocial history related to current functional performance.  CLINICAL DECISION MAKING: Moderate - several treatment options, min-mod task modification necessary  REHAB POTENTIAL: Good  EVALUATION COMPLEXITY: Moderate    PLAN:  OT FREQUENCY: 2x/week  OT DURATION: 12 weeks  PLANNED INTERVENTIONS: 97168 OT Re-evaluation, 97535 self care/ADL training, 16109 therapeutic exercise, 97530 therapeutic activity, 97112 neuromuscular re-education, 97140 manual therapy, 97018 paraffin, 60454 moist heat, 97010 cryotherapy, 97034 contrast bath, 97760 Orthotic Initial, 97763 Orthotic/Prosthetic subsequent, passive range of motion, visual/perceptual remediation/compensation, energy conservation, and DME and/or AE instructions  RECOMMENDED OTHER SERVICES: PT and ST  CONSULTED AND AGREED WITH PLAN OF CARE: Patient and family member/caregiver  PLAN FOR NEXT SESSION: see above  Duey Ghent, MS,  OTR/L 08/28/2023, 5:12 PM

## 2023-08-28 NOTE — Therapy (Signed)
 OUTPATIENT OCCUPATIONAL THERAPY NEURO TREATMENT  Patient Name: [name]. MRN: [id number] DOB:01-Oct-1950, 73 y.o., male Today's Date: 08/28/2023  PCP: Arvel Birch, MD REFERRING PROVIDER: Leotha Rang, PA-C  END OF SESSION:  OT End of Session - 08/28/23 1022     Visit Number 3    Number of Visits 24    Date for OT Re-Evaluation 11/12/23    OT Start Time 1445    OT Stop Time 1530    OT Time Calculation (min) 45 min    Activity Tolerance Patient tolerated treatment well    Behavior During Therapy WFL for tasks assessed/performed            Past Medical History:  Diagnosis Date   Anemia    Cervical myelopathy (HCC)    Chronic venous insufficiency    [name] (chronic kidney disease), stage III (HCC)    Diabetes mellitus without complication (HCC)    GERD (gastroesophageal reflux disease)    Hypercholesteremia    Hypothyroidism    Kidney stones    about 50 in the past--last in March 4/24   Leg weakness, bilateral    due to Myasthenia Gravis   Myasthenia gravis (HCC)    Spinal stenosis    Vitamin B 12 deficiency    Vitamin B 12 deficiency    Wears hearing aid    bilateral   Past Surgical History:  Procedure Laterality Date   BACK SURGERY  09/2015   CARDIAC CATHETERIZATION     CATARACT EXTRACTION W/ INTRAOCULAR LENS IMPLANT     CATARACT EXTRACTION W/PHACO Right 12/21/2015   Procedure: CATARACT EXTRACTION PHACO AND INTRAOCULAR LENS PLACEMENT (IOC);  Surgeon: Annell Kidney, MD;  Location: [facility];  Service: Ophthalmology;  Laterality: Right;  DIABETIC - insulin  and oral meds   COLONOSCOPY     SHOULDER SURGERY     labrum repair   TONSILLECTOMY     Patient Active Problem List   Diagnosis Date Noted   High blood pressure 08/13/2023   Anemia 08/13/2023   Constipation 08/13/2023   Myasthenia gravis (HCC) 08/13/2023   Acute ischemic right MCA stroke (HCC) 07/19/2023   Lymphedema 12/27/2016   Leg pain 07/15/2016   Chronic venous  insufficiency 07/15/2016   Swelling of limb 07/15/2016   Type 2 diabetes mellitus (HCC) 07/15/2016   Hyperlipidemia 07/15/2016   ONSET DATE: 07/13/2023  REFERRING DIAG: Acute ischemic R MCA CVA  THERAPY DIAG: muscle weakness (generalized), other lack of coordination, vision disturbance, hemiplegia and hemiparesis following cerebral infarction affecting left non-dominant side (HCC)   Rationale for Evaluation and Treatment: Rehabilitation  SUBJECTIVE:  SUBJECTIVE STATEMENT: Pt and spouse report that pt has gained some movement over the last week with opening his fingers.  Pt accompanied by: significant other  PERTINENT HISTORY: Pt. Was admitted to [facility] on 07/13/2023 after sustaining a CVA while on a trip to the beach. Pt. Was diagnosed with R MCA CVA. Pt. Was admitted to inpatient rehab from 07/19/2023-08/13/2023. Past medical history includes high BP, anemia, and Myasthenia Gravis.   PRECAUTIONS: None  WEIGHT BEARING RESTRICTIONS: No  PAIN:  Are you having pain? None  FALLS: Has patient fallen in last 6 months? Yes  LIVING ENVIRONMENT: Lives with: lives with their family and lives with their spouse-  Lives in: House/apartment- 2 story home and resides mostly on the 1st floor Stairs: No- Ramped entrance Has following equipment at home:  Otho Blitz, cane, Steadi, shower chair, handheld shower head, bedside commode  PLOF: Independent and Independent  with basic ADLs Independent at home   PATIENT GOALS: Would like to walk 10-20 feet each time.   OBJECTIVE:  Note: Objective measures were completed at Evaluation unless otherwise noted.  HAND DOMINANCE: Right  ADLs: Overall ADLs:  Transfers/ambulation related to ADLs: Eating: Independent, 100% R handed Grooming: independent UB Dressing: Can put on shirt independently LB Dressing: Difficulty with pants and requires assistance getting the pants on his feet, has difficulty putting on shoes and socks Toileting: Tot A for toilet  hygiene. Uses Steadi during toilet transfers. Bathing: Requires assist with using the L arm to wash the R arm. Tub Shower transfers: Roll in shower, built in shower chair, grab bars and hand held shower head.  IADLs: Shopping: Total A (Wife typically did the shopping prior to onset)   Light housekeeping: Total A (wife typically performs prior to onset) Meal Prep: Independent with light snack prep Community mobility: No driving, able to get in/out of the car Medication management: Set up assistance from his wife using a pill box (pt. Is responsible for taking medications at the correct time) Financial management: family members check behind him. Handwriting: TBD Hobbies: gardening, wood working and sports- [employer] Work history: Owns a tax business MOBILITY STATUS: Needs Assist:    POSTURE COMMENTS:   Sitting balance: Good supported sitting balance  FUNCTIONAL OUTCOME MEASURES: TBD   UPPER EXTREMITY ROM:    Active ROM Right Eval  WFL Left eval  Shoulder flexion  89(110)  Shoulder abduction  98(120)  Shoulder adduction    Shoulder extension    Shoulder internal rotation    Shoulder external rotation    Elbow flexion  120(140)  Elbow extension  -28(-10)  Wrist flexion  60(60) Rest at 60  Wrist extension  -48(42)  Wrist ulnar deviation    Wrist radial deviation    Wrist pronation    Wrist supination    (Blank rows = not tested)  L Digit flexion: 2nd:3cm (0cm) 3rd: 0cm (0cm), 4th: 0cm (0cm), 5th: 2cm (0cm)  L Digit extension: Active Gross digit extension through 10% of ROM UPPER EXTREMITY MMT:     MMT Right eval Left eval  Shoulder flexion 5 3-  Shoulder abduction 5 3-  Shoulder adduction    Shoulder extension    Shoulder internal rotation    Shoulder external rotation    Middle trapezius    Lower trapezius    Elbow flexion 5 TBD  Elbow extension 5 TBD  Wrist flexion    Wrist extension  2-  Wrist ulnar deviation    Wrist radial deviation    Wrist pronation     Wrist supination    (Blank rows = not tested)  HAND FUNCTION:   COORDINATION:   SENSATION: Sensation feels different in both hands.  L hand Light touch- impaired L hand Proprioception- impaired  EDEMA: Has had hx of edema, and came to Eval session with swelling in the L hand/wrist/forearm.   *pt. Has a laceration from his dog on the dorsal aspect of the L hand.   MUSCLE TONE: Increased tone through the UE and hand flexors.   COGNITION: Overall cognitive status: Within functional limits for tasks assessed  VISION: Subjective report: L sided inattention Baseline vision: Wears glasses for reading only Visual history: Right visual occlusion  VISION ASSESSMENT: To be assessed   PERCEPTION: TBD  PRAXIS: Impaired  TREATMENT DATE: 08/26/2023 Self Care: -Issued open fingered compression glove for L hand severe edema (2+).  Advised on wearing schedule.  Reviewed edema management techniques, including self edema massage, elevation, and frequent attempts at AROM/PROM in the L wrist and hand for fluid mobilization.  Pt/spouse verbalized understanding.  Therapeutic Exercise: -Passive stretching performed throughout the LUE, including L shoulder all planes within pain free ranges, elbow flex/ext, forearm pron/sup, wrist flex/ext, and digit flex/ext.   -Focus on increasing range for L shoulder ER, forearm sup, and wrist ext, with review of self passive stretching techniques; pt able to return demo with min vc for technique.  -AAROM for L shoulder flex, abd, ER x2 sets 10 reps each.    Neuro re-ed: -Facilitated WB through the L wrist and hand into stool seat to pt's L (lap level) for reducing distal flexor tone; OT provided gentle joint compressions at the wrist and elbow for tone normalization and increasing proprioceptive awareness, providing manual assist to stabilize L wrist  throughout. -Participation in grasp/release activity for the LUE, promoting forward reaching for circular wooden blocks at table top level, and lateral reaching to discard blocks to floor level to pt's left.  Min-mov vc to promote increased elbow extension before releasing block from hand, and intermittent min A to stabilize block on table for pt to successfully grasp block and secure in palm.  PATIENT EDUCATION: Education details: OT services plan of care goals and pt. LUE and functional status. Person educated: Patient and Spouse Education method: Explanation, Demonstration, Tactile cues, and Verbal cues Education comprehension: verbalized understanding, returned demonstration, and needs further education  HOME EXERCISE PROGRAM: Self passive stretching throughout the LUE; grasp/release activities   GOALS: Goals reviewed with patient? Yes  SHORT TERM GOALS: Target date: 10/01/2023   Pt. Will be independent with HEP for the LUE.  Baseline: Eval; no current HEP Goal status: INITIAL   LONG TERM GOALS: Target date: 11/12/2023  Pt. Will increase L shoulder flexion by 10 degrees to be able to reach up to shelves.  Baseline: Eval; L shoulder flexion is 89 (110). Goal status: INITIAL  2.  Pt. Will increase L shoulder abduction by 10 degrees to assist with underarm hygiene.  Baseline: Eval: L shoulder abduction is 98 (120).  Goal status: INITIAL  3.  Pt. Will improve L wrist extension by 10 degrees to be able to initiate anticipation of grasping for objects.  Baseline: Eval: -48 (42) Goal status: INITIAL  4.  Pt. Will improve active gross digit extension through 25% of the range to be able to release objects in his hand consistently.  Baseline: Eval: gross digit extension is 10% of the range  Goal status: INITIAL  5.  Pt. Will independently demonstrate visual compensatory strategies when navigating through environment and during tabletop tasks. Baseline: Pt. Requires consistent cuing  for L sided awareness.  Goal status: INITIAL  ASSESSMENT:  CLINICAL IMPRESSION: Pt presents with 2+ edema in L hand, 1+ in the L forearm and elbow.  Compression glove issued with pt reporting good tolerance/comfort.  Pt/spouse verbalize understanding of recommended wearing schedule and care of glove (donn intermittently during the day, washing hands as normal, and doff at night).  Spouse reports pt has demonstrated initiation of active digit extension over the last week.  Pt was able to participate in successful grasp/release of circular wooden blocks at table top level and discard from hand to floor level beside him, with min-mov vc to promote increased elbow extension before releasing block from  hand, and intermittent min A to stabilize block on table for pt to successfully grasp block and secure in palm.  Pt with good return demo of self passive stretching with focus on L forearm supination and wrist and digit ext d/t increased tightness/spasticity affecting these movements.  Continued focus needed on therapist assisted ER to increase functional range for UB ADLs and prevent L shoulder pain with functional activities.  Will plan to instruct pt on self passive/active assisted ER with follow up visits.  Pt continues to benefit from OT services to work on improving LUE functioning to increase engagement of the LUE during daily task, and to provide education about compensatory strategies during ADLs/IADLs.  PERFORMANCE DEFICITS: in functional skills including ADLs, IADLs, coordination, dexterity, proprioception, sensation, edema, tone, ROM, strength, pain, Fine motor control, Gross motor control, mobility, balance, endurance, vision, and UE functional use, cognitive skills including perception, and psychosocial skills including coping strategies, environmental adaptation, and routines and behaviors.   IMPAIRMENTS: are limiting patient from ADLs, IADLs, and leisure.   CO-MORBIDITIES: may have  co-morbidities  that affects occupational performance. Patient will benefit from skilled OT to address above impairments and improve overall function.  MODIFICATION OR ASSISTANCE TO COMPLETE EVALUATION: Min-Moderate modification of tasks or assist with assess necessary to complete an evaluation.  OT OCCUPATIONAL PROFILE AND HISTORY: Detailed assessment: Review of records and additional review of physical, cognitive, psychosocial history related to current functional performance.  CLINICAL DECISION MAKING: Moderate - several treatment options, min-mod task modification necessary  REHAB POTENTIAL: Good  EVALUATION COMPLEXITY: Moderate    PLAN:  OT FREQUENCY: 2x/week  OT DURATION: 12 weeks  PLANNED INTERVENTIONS: 97168 OT Re-evaluation, 97535 self care/ADL training, 86578 therapeutic exercise, 97530 therapeutic activity, 97112 neuromuscular re-education, 97140 manual therapy, 97018 paraffin, 46962 moist heat, 97010 cryotherapy, 97034 contrast bath, 97760 Orthotic Initial, 97763 Orthotic/Prosthetic subsequent, passive range of motion, visual/perceptual remediation/compensation, energy conservation, and DME and/or AE instructions  RECOMMENDED OTHER SERVICES: PT and ST  CONSULTED AND AGREED WITH PLAN OF CARE: Patient and family member/caregiver  PLAN FOR NEXT SESSION: see above  Marcus Sewer, MS, OTR/L   Casandra Claw, OT 08/28/2023, 10:23 AM

## 2023-09-02 ENCOUNTER — Encounter: Payer: Self-pay | Admitting: Registered Nurse

## 2023-09-02 ENCOUNTER — Encounter: Attending: Registered Nurse | Admitting: Registered Nurse

## 2023-09-02 VITALS — BP 129/81 | HR 67 | Ht 68.0 in

## 2023-09-02 DIAGNOSIS — I63511 Cerebral infarction due to unspecified occlusion or stenosis of right middle cerebral artery: Secondary | ICD-10-CM | POA: Diagnosis not present

## 2023-09-02 DIAGNOSIS — I10 Essential (primary) hypertension: Secondary | ICD-10-CM | POA: Insufficient documentation

## 2023-09-02 MED ORDER — ATORVASTATIN CALCIUM 80 MG PO TABS: 80.0000 mg | ORAL_TABLET | Freq: Every day | ORAL | 0 refills | Status: AC

## 2023-09-02 NOTE — Progress Notes (Signed)
 Subjective:    Patient ID: Douglas Cobia., male    DOB: 1950/05/14, 73 y.o.   MRN: [id number]  HPI: Douglas Bozman. is a 73 y.o. male who is here for HFU appointment or F/U of his Acute Ischemic Right MCA Stroke and Essential Hypertension. He was admitted at [facility] on 07/14/2023 with complaints of stuttering speech and fluctuating LUE weakness followed by fall.   HPI (initial encounter) [facility] [name]. is a 73 y.o. Male with PMHx of HTN, HLD, seropositive myasthenia gravis, DM2, [name], hypothyroidism, GERD, cervical stenosis s/p C3-7 fusion (2020) who presents to the emergency department for evaluation of generalized weakness. Neurology consulted for left hand weakness and bilateral lower extremity weakness.  Per patient and patient wife at bedside, beginning on Thursday evening patient was noted to have left hand clumsiness. Patient states that his left hand felt weak and he was having difficulty coordinating movements. Particularly, he noticed that he was having difficulty holding his phone. These symptoms have continued to persist since onset with mild improvement. Additionally, he also endorses persistent bilateral lower extremity weakness for the past few days that he describes as his lower legs feeling tired. States that a baseline he typically ambulates with a cane, though was able to ambulate independently around his house. However, over the last 2 days he has had worsening lower extremity weakness requiring use of a rollator walker, though still has difficulty standing for long periods of time. His weakness resulted in a fall yesterday when he fell onto his left side. Notably, patient contacted his neurologist who increased his dose of prednisone  to 10 mg daily and ordered an outpatient cervical spine MRI. Since starting on 10 mg of prednisone , he states that leg weakness has improved.   CT Brain: WO Contrast:  MPRESSION:  New right frontal lobe hypodensity compatible  with new infarct.   MR: Brain: WO Contrast:  IMPRESSION:  Acute right MCA distribution infarct involving the right periventricular  white matter extending into the right frontal and parietal lobes and insula  , some of which correlates to the recent CT finding. No significant  associated mass effect or associated hemorrhage.   [name] was admitted to Inpatient Rehabilitation on 07/19/2023 and discharged home on 08/13/2023. He is receiving Outpatient Therapy at [facility] He denies any pain. He rates his pain 0. Also reports he has a good appetite.   Pain Inventory Average Pain 0 Pain Right Now 0 My pain is no pain   BOWEL Number of stools per week: 3 Oral laxative use Yes  Type of laxative miralox  BLADDER Normal   Mobility walk with assistance ability to climb steps?  no do you drive?  no use a wheelchair transfers alone  Function retired I need assistance with the following:  dressing, toileting, and household duties  Neuro/Psych trouble walking  Prior Studies Any changes since last visit?  no  going to [employer]  Physicians involved in your care Any changes since last visit?  no   Family History  Problem Relation Age of Onset   Clotting disorder Father    Heart attack Father    Social History   Socioeconomic History   Marital status: Married    Spouse name: Not on file   Number of children: Not on file   Years of education: Not on file   Highest education level: Not on file  Occupational History   Not on file  Tobacco Use  Smoking status: Never   Smokeless tobacco: Never  Substance and Sexual Activity   Alcohol use: No   Drug use: No   Sexual activity: Not on file  Other Topics Concern   Not on file  Social History Narrative   Not on file   Social Drivers of Health   Financial Resource Strain: Low Risk  (07/15/2023)   Received from [facility]   Overall Financial Resource Strain (CARDIA)    Difficulty of Paying  Living Expenses: Not hard at all  Food Insecurity: No Food Insecurity (07/15/2023)   Received from [facility]   Hunger Vital Sign    Worried About Running Out of Food in the Last Year: Never true    Ran Out of Food in the Last Year: Never true  Transportation Needs: No Transportation Needs (07/15/2023)   Received from [facility] - Transportation    In the past 12 months, has lack of transportation kept you from medical appointments or from getting medications?: No    Lack of Transportation (Non-Medical): No  Physical Activity: Not on file  Stress: Not on file  Social Connections: Unknown (08/07/2021)   Received from [facility]   Social Network    Social Network: Not on file   Past Surgical History:  Procedure Laterality Date   BACK SURGERY  09/2015   CARDIAC CATHETERIZATION     CATARACT EXTRACTION W/ INTRAOCULAR LENS IMPLANT     CATARACT EXTRACTION W/PHACO Right 12/21/2015   Procedure: CATARACT EXTRACTION PHACO AND INTRAOCULAR LENS PLACEMENT (IOC);  Surgeon: Annell Kidney, MD;  Location: [facility];  Service: Ophthalmology;  Laterality: Right;  DIABETIC - insulin  and oral meds   COLONOSCOPY     SHOULDER SURGERY     labrum repair   TONSILLECTOMY     Past Medical History:  Diagnosis Date   Anemia    Cervical myelopathy (HCC)    Chronic venous insufficiency    [name] (chronic kidney disease), stage III (HCC)    Diabetes mellitus without complication (HCC)    GERD (gastroesophageal reflux disease)    Hypercholesteremia    Hypothyroidism    Kidney stones    about 50 in the past--last in March 4/24   Leg weakness, bilateral    due to Myasthenia Gravis   Myasthenia gravis (HCC)    Spinal stenosis    Vitamin B 12 deficiency    Vitamin B 12 deficiency    Wears hearing aid    bilateral   BP 129/81   Pulse 67   Ht 5' 8 (1.727 m)   SpO2 97%   BMI 42.81 kg/m   Opioid Risk Score:   Fall Risk Score:   `1  Depression screen [facility] 2/9     09/02/2023    1:43 PM  Depression screen PHQ 2/9  Decreased Interest 0  Down, Depressed, Hopeless 0  PHQ - 2 Score 0  Altered sleeping 0  Tired, decreased energy 0  Change in appetite 0  Feeling bad or failure about yourself  2  Trouble concentrating 0  Moving slowly or fidgety/restless 0  Suicidal thoughts 0  PHQ-9 Score 2      Review of Systems  Cardiovascular:  Positive for leg swelling.  Endocrine:       High bs  Musculoskeletal:  Positive for gait problem.  All other systems reviewed and are negative.      Objective:   Physical Exam Vitals and nursing note  reviewed.  Constitutional:      Appearance: Normal appearance.   Cardiovascular:     Rate and Rhythm: Normal rate and regular rhythm.     Pulses: Normal pulses.     Heart sounds: Normal heart sounds.  Pulmonary:     Effort: Pulmonary effort is normal.     Breath sounds: Normal breath sounds.   Musculoskeletal:     Comments: Normal Muscle Bulk and Muscle Testing Reveals:  Upper Extremities: Right: Full ROM and Muscle Strength 5/5 Left Upper Extremity: Decreased ROM 90 Degrees and Muscle Strength 2/5  Lower Extremities: Right: Full ROM and Muscle Strength 5/5 Left Lower Extremity: Decreased ROM and Muscle Strength Arrived in wheelchair      Skin:    General: Skin is warm and dry.   Neurological:     Mental Status: He is alert and oriented to person, place, and time.   Psychiatric:        Mood and Affect: Mood normal.        Behavior: Behavior normal.         Assessment & Plan:  Acute Ischemic Right MCA Stroke: Continue Outpatient Therapy at [facility]. He will F/U with Neurology at [facility].  Essential Hypertension.Continue current medication regimen. PCP following. Continue to Monitor.  F/U with Dr Sharl Davies in 4-6 weeks

## 2023-09-02 NOTE — Patient Instructions (Signed)
 Call your Primary Care Physician to schedule hospital follow up appointment ,

## 2023-09-03 ENCOUNTER — Ambulatory Visit: Admitting: Physical Therapy

## 2023-09-03 ENCOUNTER — Ambulatory Visit: Admitting: Occupational Therapy

## 2023-09-03 ENCOUNTER — Ambulatory Visit: Admitting: Speech Pathology

## 2023-09-03 DIAGNOSIS — M6281 Muscle weakness (generalized): Secondary | ICD-10-CM

## 2023-09-03 DIAGNOSIS — R278 Other lack of coordination: Secondary | ICD-10-CM

## 2023-09-03 DIAGNOSIS — I69354 Hemiplegia and hemiparesis following cerebral infarction affecting left non-dominant side: Secondary | ICD-10-CM

## 2023-09-03 DIAGNOSIS — R269 Unspecified abnormalities of gait and mobility: Secondary | ICD-10-CM

## 2023-09-03 DIAGNOSIS — H539 Unspecified visual disturbance: Secondary | ICD-10-CM

## 2023-09-03 DIAGNOSIS — R41841 Cognitive communication deficit: Secondary | ICD-10-CM

## 2023-09-03 NOTE — Therapy (Signed)
 OUTPATIENT PHYSICAL THERAPY NEURO TREATMENT   Patient Name: [name]. MRN: [id number] DOB:04/17/1950, 73 y.o., male Today's Date: 09/03/2023   PCP: Glenora Laos, MD  REFERRING PROVIDER:    Sterling Eisenmenger, PA-C    END OF SESSION:   PT End of Session - 09/03/23 1531     Visit Number 5    Number of Visits 24    Date for PT Re-Evaluation 11/12/23    PT Start Time 1531    PT Stop Time 1615    PT Time Calculation (min) 44 min    Equipment Utilized During Treatment Gait belt;Other (comment)   L Swedish knee cage & L RW hand splint   Activity Tolerance Patient tolerated treatment well    Behavior During Therapy WFL for tasks assessed/performed                Past Medical History:  Diagnosis Date   Anemia    Cervical myelopathy (HCC)    Chronic venous insufficiency    [name] (chronic kidney disease), stage III (HCC)    Diabetes mellitus without complication (HCC)    GERD (gastroesophageal reflux disease)    Hypercholesteremia    Hypothyroidism    Kidney stones    about 50 in the past--last in March 4/24   Leg weakness, bilateral    due to Myasthenia Gravis   Myasthenia gravis (HCC)    Spinal stenosis    Vitamin B 12 deficiency    Vitamin B 12 deficiency    Wears hearing aid    bilateral   Past Surgical History:  Procedure Laterality Date   BACK SURGERY  09/2015   CARDIAC CATHETERIZATION     CATARACT EXTRACTION W/ INTRAOCULAR LENS IMPLANT     CATARACT EXTRACTION W/PHACO Right 12/21/2015   Procedure: CATARACT EXTRACTION PHACO AND INTRAOCULAR LENS PLACEMENT (IOC);  Surgeon: Annell Kidney, MD;  Location: [facility];  Service: Ophthalmology;  Laterality: Right;  DIABETIC - insulin  and oral meds   COLONOSCOPY     SHOULDER SURGERY     labrum repair   TONSILLECTOMY     Patient Active Problem List   Diagnosis Date Noted   High blood pressure 08/13/2023   Anemia 08/13/2023   Constipation 08/13/2023   Myasthenia gravis (HCC)  08/13/2023   Acute ischemic right MCA stroke (HCC) 07/19/2023   Lymphedema 12/27/2016   Leg pain 07/15/2016   Chronic venous insufficiency 07/15/2016   Swelling of limb 07/15/2016   Type 2 diabetes mellitus (HCC) 07/15/2016   Hyperlipidemia 07/15/2016    ONSET DATE:   REFERRING DIAG:  Diagnosis  I63.511 (ICD-10-CM) - Cerebral infarction due to unspecified occlusion or stenosis of right middle cerebral artery    THERAPY DIAG:  Muscle weakness (generalized)  Other lack of coordination  Vision disturbance  Hemiplegia and hemiparesis following cerebral infarction affecting left non-dominant side (HCC)  Abnormality of gait and mobility  Rationale for Evaluation and Treatment: Rehabilitation  SUBJECTIVE:  SUBJECTIVE STATEMENT:  Pt states, during car transfers, he cannot lift his L LE up over the threshold to place it in the vehicle without significant assistance from his wife. Pt states in his taller vehicles, he used to rely on pulling up with his R hand on car handle and pushing up through R LE on running board to help him transfer into the vehicle. Pt asking if he can change the way he transfers in/out of the car and step-up with his R LE instead - therapist states she would need to perform car transfers with him during a session in order to advise on a safe transfer technique. [name], wife, states she is concerned about how pt will turn and cross over his L LE to lift it up into the car when pt steps up with R LE. Pt states he has have only ridden in their [employer] Equinox, but they also have a [phone] that is similar size/height. Prior to CVA, pt reports he had trouble having enough room in the Equinox for his LEs, due to his height.    Pt and wife state that despite these concerns, they  feel focusing on patient's walking during physical therapy session today is more important. Both decline having therapist assist with practicing car transfers using the [employer].  Pt had PM&R NP visit yesterday 09/02/2023: Pt states the appointment went well. No changes to his medications. (The note is still incomplete in pt's chart at this time)  Pt denies stumbles/falls. Pt states L LE doesn't feel "weak" today, but states every "once in a while it wants to buckle a little bit" (reports it has only done it 1x in the past week).   Pt states he doesn't usually wear his L swedish knee cage at home because he doesn't have trouble with his transfers without it. Reports the brace is also too tight on his leg so he cannot leave it donned for risk of skin breakdown.   Pt states he didn't enjoy using litegait for gait training during last session because he felt it was moving all over the place making it harder for him to walk.   Pt reports his goal is to learn to walk again if at all possible, even if it is only 10 feet, and he wants to focus on walking during every PT session.   From Initial Eval: Pt reports that he is doing well. Has not been moving much since discharging from [facility], as therapists instructed pt to perform transfers only at home due to high fall risk with ambulation. Continues to have significant weakenss in the LUE with difficulty extending fingers as well as numbness and inattention to the L side of body resulting in multiple cuts on Arm and leg with WC mobility in home.  Will be attaining custom power WC from [employer]. Has loaner currently.   Pt accompanied by: significant other [name]   PERTINENT HISTORY: [name]. is a 73 y.o. male with history of T2DM, HTN, [name], renal calculi, seropositive myasthenia gravis who was admitted to [facility] on 07/14/2023 with reports 3 to 4-day history of stuttering speech and fluctuating LUE weakness followed by fall.  He reported some double  vision and per discussion with his neurology I had increased prednisone  to 10 mg/day due to concerns of MG flare.  He was found to have left facial weakness with dysarthria as well as LUE and LLE weakness.  MRI brain done revealing acute right MCA infarction involving  right periventricular white matter and extending into right frontal and parietal lobes.    [name]. was admitted to rehab 07/19/2023 for inpatient therapies to consist of PT, ST and OT at least three hours five days a week. Past admission physiatrist, therapy team and rehab RN have worked together to provide customized collaborative inpatient rehab.  He continues on low-dose aspirin  and subcu Lovenox  was used for DVT prophylaxis during his stay.  His blood pressures were monitored on TID basis and has been stable on current regimen.  Serial check of CBC shows H&H to be relatively stable stool guaiac was negative x 1.  He continues to have lower extremity edema and was educated on importance of elevation for edema control.  Constipation has resolved with sorbitol  use on intermittent basis.  P.o. intake has been good.  He was noted to have Candida crura's which was treated with course of Diflucan.  MASD has resolved with use of Gerhardt's Butt cream.   Left hemiplegia as been a limiting factor with left knee pain noted with increase in activity.  EKG was added for support and pain management.  His diabetes has been monitored with ac/hs CBG checks and SSI was use prn for tighter BS control.  Metformin  was resumed at 5 mg twice daily and basal insulin  was slowly titrated upwards to 26 units.  Blood sugars continue to be variable and he was advised to increase metformin  to home dose and titrate insulin  glargine slowly if blood sugars range over 150.  He has made steady gains during his stay and continues to be limited by mild left inattention with left knee plegia and weakness. He has been evaluated for  speciality wheelchair by [employer]. He  will continue to receive follow up outpatient PT, OT and ST at [facility] after discharge.   PAIN:  Are you having pain? No and reports Numbness in the L H and and Leg   PRECAUTIONS: Fall  RED FLAGS: Hx of MG   WEIGHT BEARING RESTRICTIONS: No  FALLS: Has patient fallen in last 6 months? Yes. Number of falls 1  LIVING ENVIRONMENT: Lives with: lives with their spouse Lives in: House/apartment Stairs: Ramps installed while in the Hospital  Has following equipment at home: O[employer]o Blitz - 2 wheeled and Wheelchair (power)  PLOF: Independent with basic ADLs, Independent with household mobility without device, and Independent with community mobility with device with [facility]   PATIENT GOALS: relearn to Walk 15-25ft.   OBJECTIVE:  Note: Objective measures were completed at Evaluation unless otherwise noted.  DIAGNOSTIC FINDINGS: MRI brain done revealing acute right MCA infarction involving right periventricular white matter and extending into right frontal and parietal lobes.  COGNITION: Overall cognitive status: Impaired poor awareness of L side while dual tasking.    SENSATION: Light touch: Impaired   COORDINATION: Limited ROM and strength on the LUE limiting finger to nose.  ROM and strength deficits limit ankle to knee.   EDEMA:  Mild edema in the L UE.   MUSCLE TONE: LLE: Mild    POSTURE: flexed trunk. Lateral lean to the L   LOWER EXTREMITY ROM:     Active  Right Eval Left Eval  Hip flexion    Hip extension    Hip abduction    Hip adduction    Hip internal rotation    Hip external rotation    Knee flexion    Knee extension    Ankle dorsiflexion    Ankle plantarflexion  Ankle inversion    Ankle eversion     (Blank rows = not tested)  LOWER EXTREMITY MMT:    MMT Right Eval Left Eval  Hip flexion 4+ 4-  Hip extension 4+ 3+  Hip abduction 4+ 3+  Hip adduction 4+ 3+  Hip internal rotation    Hip external rotation    Knee flexion 5 4-  Knee  extension 5 4-  Ankle dorsiflexion 5 4  Ankle plantarflexion    Ankle inversion    Ankle eversion    (Blank rows = not tested)  BED MOBILITY:  Findings: Sit to supine Mod A and Max A Supine to sit Mod A and Max A  TRANSFERS: Sit to stand: Min A and Mod A  Assistive device utilized: Environmental consultant - 2 wheeled     Stand to sit: Min A  Assistive device utilized: Environmental consultant - 2 wheeled     Chair to chair: Min A and Mod A  Assistive device utilized: Environmental consultant - 2 wheeled      PT required to stabilize the LUE on RW as well as provide max cues for attention to hand to prevent sliding off hand grip.  At home Pt utilizes hand splint for stability on RW to prevent loss of grip in transfers.   RAMP:  Not tested  CURB:  Not tested  STAIRS: Not tested GAIT: Findings: Gait Characteristics: step to pattern, decreased hip/knee flexion- Left, Left foot flat, ataxic, lateral lean- Left, wide BOS, and poor foot clearance- Left, Distance walked: 21ft, Assistive device utilized:Walker - 2 wheeled, Level of assistance: Mod A and Max A, and Comments: poor awareness of lateral/anterior L LOB as well as poor awareness of LUE causing loos of grip on RW and heavy lateral LOB to the L, +2 assist required to prevent fall and position chair under patient as he attempted turn at 9ft.    FUNCTIONAL TESTS:  5 times sit to stand: 1:29 min Timed up and go (TUG): unable to complete turn at 43ft.  10 meter walk test: unable to complete on this day.   PATIENT SURVEYS:  Stroke Impact Scale 49/80                                                                                                                              TREATMENT DATE: 09/03/2023  Pt wearing L swedish knee cage for therapy. Pt's wife brought pt's personal L hand splint to use on bari-RW during session.  Stand pivot from transport chair to clinic's wheelchair chair using bari-RW (with L hand support) and skilled min A for lifting to stand and balance while turning  - cuing for L foot placement when turning due to impaired proprioception/awareness.   During session, sit>stands from clinic's w/c to bari-RW with skilled heavy min A for lifting to stand and balance due to posterior lean bias today - therapist assists with L hand placement on RW hand splint and assists with L  foot placement prior to initiating coming to stand. (This continues to be an excessively low seat height for pt despite placing airex pad in wheelchair seta for improved floor-to-seat height).    Gait training 41ft + 66ft using bari-RW with L hand splint and skilled heavy min progressed to heavy mod assist for balance due to impaired midline orientation with progressively stronger L lean. Requires +2 w/c follow for safety. Pt demonstrating the following gait deviations with therapist providing the described cuing and facilitation for improvement:  Utilized mirror feedback in hallway down straight pathway to improve pt's midline orientation and decrease L lean Improved L LE foot placement during swing advancement today with decreased hip external rotation and hip adduction, until pt becomes fatigued towards end of the gait trials and reverts back to excessive L hip adduction with increased midline orientation impairment Continues w/ impaired proprioceptive/sensory awareness of L foot location L knee doesn't have as quick nor forceful movement into knee extension in the swedish knee cage during stance phase today due to improving timing of L glute/quad activation and wt shifting onto L LE during stance Pt continues to lacks forward progression of pelvis over L stance phase, but improving With fatigue noticed more forceful snapping into extension at end of gait trials Continues to have strong L lean bias that comes on with fatigue over increased distance, but pt able to ambulate further before this occurs Repeated cuing and facilitation for increased R wt shift during R stance Pt having increasing  awareness of when he is leaning Repeated mod/max assist/facilitation to adjust AD to account for this lean, Potential pushing syndrome contributing to L lean and impaired midline orientation Able to achieve more consistent reciprocal stepping pattern rather than step-to pattern leading with L LE Does tend to have the stronger L lean immediately after stepping R LE forward requiring cuing to then wt shift towards R 1x pt's L hand did slide off of RW hand splint requiring mod A for balance and cuing to maintain static standing balance while therapist corrected his hand placement to avoid further LOB   Pt and wife asking if pt is safe to attempt ambulation at home and therapist clearly stated it is not safe at this time as pt is requiring skilled assistance to maintain upright. Educated that if/when patient reaches a point he can participate in some short distance gait at home, then it will be only in a straight pathway with +2 assistance (1 for assisting patient and another for w/c follow) with a wall on his R side. But clearly stated patient is not safe to attempt gait without physical therapist at this time.   PATIENT EDUCATION: Education details: POC. Goals. Benefits of gait training in body weight support system.  Person educated: Patient and Spouse Education method: Explanation Education comprehension: verbalized understanding  HOME EXERCISE PROGRAM:  To be provided at upcoming visits  GOALS: Goals reviewed with patient? Yes  SHORT TERM GOALS: Target date: 09/25/2023    Patient will be independent in home exercise program to improve strength/mobility for better functional independence with ADLs. Baseline: To be provided at upcoming visits Goal status: INITIAL   LONG TERM GOALS: Target date: 11/13/2023    Patient will increase SIS-16 score to equal to or greater than 10points    to demonstrate statistically significant improvement in mobility and quality of life.  Baseline:  49/80 Goal status: INITIAL  2.  Patient (> 23 years old) will complete five times sit to stand test in <  15 seconds indicating an increased LE strength and improved balance. Baseline: 1:77min Goal status: INITIAL  3.  Patient will increase FIST score by > 6 points to demonstrate decreased fall risk during functional activities Baseline:  43/56  Goal status: INITIAL  4.  Patient will increase 10 meter walk test to >0.3m/s as to improve gait speed for better community ambulation and to reduce fall risk. Baseline: unable to complete  Goal status: INITIAL  5.  Patient will reduce timed up and go to <11 seconds to reduce fall risk and demonstrate improved transfer/gait ability. Baseline: unable to complete turn at 10 ft. Mod-max assist due to poor control of LW on RW, resulting in L lateral LOB.  Goal status: INITIAL   ASSESSMENT:  CLINICAL IMPRESSION:  Patient is a 73 y.o. male who was seen today for physical therapy treatment for balance, coordination, and gait deficits following CVA in April. Pt has hx of MG and was initially treated for MG exacerbation, but was then found to have CVA affecting LUE>LLE as well as poor awareness of L side. Patient's wife again brought in pt's L RW hand splint for use during session allowing increased participation in standing and gait training because pt with significant L hemibody inattention/impaired proprioception impacting his awareness and coordination of that side. Therapy session focused on continued progression of gait training using bari-RW due to pt reports of feeling unstable in litegait harness last session, impacting his motor planning. Patient progressed gait training to 80ft! Using bari-RW with skilled min/mod A and +2 w/c follow for safety with pt demonstrating improved R wt shift onto R stance and improved L LE foot placement/advancement during swing. Patient reports some difficulties with car transfers; however, declines therapist's assistance  with practicing this during todays' session. Pt will benefit from skilled PT to address strength, balance, coordination, attention deficits to improve function and overall QoL and return to PLOF.   OBJECTIVE IMPAIRMENTS: Abnormal gait, cardiopulmonary status limiting activity, decreased activity tolerance, decreased balance, decreased cognition, decreased coordination, decreased endurance, decreased knowledge of condition, decreased knowledge of use of DME, decreased mobility, difficulty walking, decreased ROM, decreased strength, decreased safety awareness, dizziness, hypomobility, increased fascial restrictions, impaired perceived functional ability, increased muscle spasms, impaired sensation, impaired tone, impaired UE functional use, impaired vision/preception, improper body mechanics, postural dysfunction, and obesity.   ACTIVITY LIMITATIONS: carrying, lifting, bending, sitting, standing, squatting, stairs, transfers, bed mobility, bathing, toileting, dressing, reach over head, hygiene/grooming, locomotion level, and caring for others  PARTICIPATION LIMITATIONS: meal prep, cleaning, laundry, medication management, interpersonal relationship, driving, shopping, community activity, and yard work  PERSONAL FACTORS: Age, Past/current experiences, Time since onset of injury/illness/exacerbation, and 3+ comorbidities: MG, HTN, CVA, lymphedema are also affecting patient's functional outcome.   REHAB POTENTIAL: Good  CLINICAL DECISION MAKING: Unstable/unpredictable  EVALUATION COMPLEXITY: High  PLAN:  PT FREQUENCY: 1-2x/week  PT DURATION: 12 weeks  PLANNED INTERVENTIONS: 97164- PT Re-evaluation, 97750- Physical Performance Testing, 97110-Therapeutic exercises, 97530- Therapeutic activity, [id number]- Neuromuscular re-education, 97535- Self Care, 16109- Manual therapy, [id number]- Gait training, [id number]- Orthotic Initial, [id number]- Orthotic/Prosthetic subsequent, [id number]- Canalith repositioning, U9811- Electrical  stimulation (unattended), [phone]- Electrical stimulation (manual), [id number]- Wound care (first 20 sq cm), 97598- Wound care (each additional 20 sq cm), Patient/Family education, Balance training, Stair training, Taping, Dry Needling, Joint mobilization, Joint manipulation, Vestibular training, Visual/preceptual remediation/compensation, Cognitive remediation, DME instructions, Wheelchair mobility training, Cryotherapy, and Moist heat  PLAN FOR NEXT SESSION:  TUG Initiate HEP Gait training with L hand splint on bari-RW  Work  on midline orientation and R weight shifting Work on L LE coordination and strength for improved swing phase advancement Provide HEP    Oval Moralez, PT, DPT, NCS, CSRS Physical Therapist - [place]  [facility]  4:51 PM 09/03/23

## 2023-09-03 NOTE — Therapy (Addendum)
 OUTPATIENT OCCUPATIONAL THERAPY NEURO TREATMENT  Patient Name: [name]. MRN: [id number] DOB:May 05, 1950, 73 y.o., male Today's Date: 09/03/2023  PCP: Arvel Birch, MD REFERRING PROVIDER: Leotha Rang, PA-C  END OF SESSION:  OT End of Session - 09/03/23 1852     Visit Number 5    Number of Visits 24    Date for OT Re-Evaluation 11/12/23    OT Start Time 1015    OT Stop Time 1100    OT Time Calculation (min) 45 min    Activity Tolerance Patient tolerated treatment well    Behavior During Therapy WFL for tasks assessed/performed            Past Medical History:  Diagnosis Date   Anemia    Cervical myelopathy (HCC)    Chronic venous insufficiency    [name] (chronic kidney disease), stage III (HCC)    Diabetes mellitus without complication (HCC)    GERD (gastroesophageal reflux disease)    Hypercholesteremia    Hypothyroidism    Kidney stones    about 50 in the past--last in March 4/24   Leg weakness, bilateral    due to Myasthenia Gravis   Myasthenia gravis (HCC)    Spinal stenosis    Vitamin B 12 deficiency    Vitamin B 12 deficiency    Wears hearing aid    bilateral   Past Surgical History:  Procedure Laterality Date   BACK SURGERY  09/2015   CARDIAC CATHETERIZATION     CATARACT EXTRACTION W/ INTRAOCULAR LENS IMPLANT     CATARACT EXTRACTION W/PHACO Right 12/21/2015   Procedure: CATARACT EXTRACTION PHACO AND INTRAOCULAR LENS PLACEMENT (IOC);  Surgeon: Annell Kidney, MD;  Location: [facility];  Service: Ophthalmology;  Laterality: Right;  DIABETIC - insulin  and oral meds   COLONOSCOPY     SHOULDER SURGERY     labrum repair   TONSILLECTOMY     Patient Active Problem List   Diagnosis Date Noted   High blood pressure 08/13/2023   Anemia 08/13/2023   Constipation 08/13/2023   Myasthenia gravis (HCC) 08/13/2023   Acute ischemic right MCA stroke (HCC) 07/19/2023   Lymphedema 12/27/2016   Leg pain 07/15/2016   Chronic venous  insufficiency 07/15/2016   Swelling of limb 07/15/2016   Type 2 diabetes mellitus (HCC) 07/15/2016   Hyperlipidemia 07/15/2016   ONSET DATE: 07/13/2023  REFERRING DIAG: Acute ischemic R MCA CVA  THERAPY DIAG: muscle weakness (generalized), other lack of coordination, vision disturbance, hemiplegia and hemiparesis following cerebral infarction affecting left non-dominant side (HCC)   Rationale for Evaluation and Treatment: Rehabilitation  SUBJECTIVE:  SUBJECTIVE STATEMENT: Pt and spouse report that pt has gained some movement over the last week with opening his fingers.  Pt accompanied by: significant other  PERTINENT HISTORY: Pt. Was admitted to [facility] on 07/13/2023 after sustaining a CVA while on a trip to the beach. Pt. Was diagnosed with R MCA CVA. Pt. Was admitted to inpatient rehab from 07/19/2023-08/13/2023. Past medical history includes high BP, anemia, and Myasthenia Gravis.   PRECAUTIONS: None  WEIGHT BEARING RESTRICTIONS: No  PAIN:  Are you having pain? None  FALLS: Has patient fallen in last 6 months? Yes  LIVING ENVIRONMENT: Lives with: lives with their family and lives with their spouse-  Lives in: House/apartment- 2 story home and resides mostly on the 1st floor Stairs: No- Ramped entrance Has following equipment at home:  Otho Blitz, cane, Steadi, shower chair, handheld shower head, bedside commode  PLOF: Independent and Independent  with basic ADLs Independent at home   PATIENT GOALS: Would like to walk 10-20 feet each time.   OBJECTIVE:  Note: Objective measures were completed at Evaluation unless otherwise noted.  HAND DOMINANCE: Right  ADLs: Overall ADLs:  Transfers/ambulation related to ADLs: Eating: Independent, 100% R handed Grooming: independent UB Dressing: Can put on shirt independently LB Dressing: Difficulty with pants and requires assistance getting the pants on his feet, has difficulty putting on shoes and socks Toileting: Tot A for toilet  hygiene. Uses Steadi during toilet transfers. Bathing: Requires assist with using the L arm to wash the R arm. Tub Shower transfers: Roll in shower, built in shower chair, grab bars and hand held shower head.  IADLs: Shopping: Total A (Wife typically did the shopping prior to onset)   Light housekeeping: Total A (wife typically performs prior to onset) Meal Prep: Independent with light snack prep Community mobility: No driving, able to get in/out of the car Medication management: Set up assistance from his wife using a pill box (pt. Is responsible for taking medications at the correct time) Financial management: family members check behind him. Handwriting: TBD Hobbies: gardening, wood working and sports- [employer] Work history: Owns a tax business MOBILITY STATUS: Needs Assist:    POSTURE COMMENTS:   Sitting balance: Good supported sitting balance  FUNCTIONAL OUTCOME MEASURES: TBD   UPPER EXTREMITY ROM:    Active ROM Right Eval  WFL Left eval  Shoulder flexion  89(110)  Shoulder abduction  98(120)  Shoulder adduction    Shoulder extension    Shoulder internal rotation    Shoulder external rotation    Elbow flexion  120(140)  Elbow extension  -28(-10)  Wrist flexion  60(60) Rest at 60  Wrist extension  -48(42)  Wrist ulnar deviation    Wrist radial deviation    Wrist pronation    Wrist supination    (Blank rows = not tested)  L Digit flexion: 2nd:3cm (0cm) 3rd: 0cm (0cm), 4th: 0cm (0cm), 5th: 2cm (0cm)  L Digit extension: Active Gross digit extension through 10% of ROM UPPER EXTREMITY MMT:     MMT Right eval Left eval  Shoulder flexion 5 3-  Shoulder abduction 5 3-  Shoulder adduction    Shoulder extension    Shoulder internal rotation    Shoulder external rotation    Middle trapezius    Lower trapezius    Elbow flexion 5 TBD  Elbow extension 5 TBD  Wrist flexion    Wrist extension  2-  Wrist ulnar deviation    Wrist radial deviation    Wrist pronation     Wrist supination    (Blank rows = not tested)  HAND FUNCTION:   COORDINATION:   SENSATION: Sensation feels different in both hands.  L hand Light touch- impaired L hand Proprioception- impaired  EDEMA: Has had hx of edema, and came to Eval session with swelling in the L hand/wrist/forearm.   *pt. Has a laceration from his dog on the dorsal aspect of the L hand.   MUSCLE TONE: Increased tone through the UE and hand flexors.   COGNITION: Overall cognitive status: Within functional limits for tasks assessed  VISION: Subjective report: L sided inattention Baseline vision: Wears glasses for reading only Visual history: Right visual occlusion  VISION ASSESSMENT: To be assessed   PERCEPTION: TBD  PRAXIS: Impaired  TREATMENT DATE: 09/03/2023  Manual Therapy:   -Pt. tolerated left scapular mobilizations for elevation, depression, abduction/rotation to normalize tone, and prepare the UE for ROM.   -Pt. tolerated retrograde massage to the Left hand for edema control 2/2 increased edema.  -Manual therapy was performed independent of, and in preparation for therapeutic Ex.    Therapeutic Exercise:  -AAROM/AROM stretching performed throughout the LUE, including L shoulder all planes within pain free ranges, elbow flex/ext, forearm pron/sup, wrist flex/ext, and digit flex/ext.  -Focus on increasing range for L shoulder ER, forearm sup, and wrist ext, with review of self passive stretching techniques; pt able to return demo with min vc for technique with cues  Neuromuscular re-education:   -facilitated left wrist and digit extension with facilitory tapping to the forearm wrist, and digit extensors.  -Alternated the above movements with weight weightbearing and proprioceptive input to normalize tone for wrist, and digit extension reps. -facilitated grasping patterns with   facilitation of active thumb abduction in anticipation of grasping objects using a lateral grasp.   Therapeutic Activities:  -facilitated combinations of LUE movements through diagonal patterns in preparation for self-grooming tasks with hand-over assist. -facilitated LUE functional reaching for cones moving the UE through multiple planes to place the cones on multiple tabletop surfaces. -Pt. Required support proximally    Self-care:   -Pt. worked on Engineer, technical sales using a universal cuff with a spoon facilitating hand to mouth patterns, and scooping action with hand-over-hand assist.   PATIENT EDUCATION: Education details:  LUE ROM, Edema control techniques, and  functional reaching tasks.  Person educated: Patient and Spouse Education method: Explanation, Demonstration, Tactile cues, and Verbal cues Education comprehension: verbalized understanding, returned demonstration, and needs further education  HOME EXERCISE PROGRAM: Self passive stretching throughout the LUE; grasp/release activities   GOALS: Goals reviewed with patient? Yes  SHORT TERM GOALS: Target date: 10/01/2023   Pt. Will be independent with HEP for the LUE.  Baseline: Eval; no current HEP Goal status: INITIAL   LONG TERM GOALS: Target date: 11/12/2023  Pt. Will increase L shoulder flexion by 10 degrees to be able to reach up to shelves.  Baseline: Eval; L shoulder flexion is 89 (110). Goal status: INITIAL  2.  Pt. Will increase L shoulder abduction by 10 degrees to assist with underarm hygiene.  Baseline: Eval: L shoulder abduction is 98 (120).  Goal status: INITIAL  3.  Pt. Will improve L wrist extension by 10 degrees to be able to initiate anticipation of grasping for objects.  Baseline: Eval: -48 (42) Goal status: INITIAL  4.  Pt. Will improve active gross digit extension through 25% of the range to be able to release objects in his hand consistently.  Baseline: Eval: gross digit extension is  10% of the range  Goal status: INITIAL  5.  Pt. Will independently demonstrate visual compensatory strategies when navigating through environment and during tabletop tasks. Baseline: Pt. Requires consistent cuing for L sided awareness.  Goal status: INITIAL  ASSESSMENT:  CLINICAL IMPRESSION:  Pt. presents with notably improved edema in the left hand today. Pt. requires hand over hand assist through all movements patterns, and hand to mouth patterns using the universal cuff today. Pt. presents with increased active wrist extension reps consistently when facilitated  today. Pt. Presents with consistent active thumb movements when grasping, and releasing 1/2" objects from a lateral grasp.Aaron Aas Pt continues to benefit from OT services to work on improving LUE functioning to increase engagement of the LUE during daily  task, and to provide education about compensatory strategies during ADLs/IADLs.  PERFORMANCE DEFICITS: in functional skills including ADLs, IADLs, coordination, dexterity, proprioception, sensation, edema, tone, ROM, strength, pain, Fine motor control, Gross motor control, mobility, balance, endurance, vision, and UE functional use, cognitive skills including perception, and psychosocial skills including coping strategies, environmental adaptation, and routines and behaviors.   IMPAIRMENTS: are limiting patient from ADLs, IADLs, and leisure.   CO-MORBIDITIES: may have co-morbidities  that affects occupational performance. Patient will benefit from skilled OT to address above impairments and improve overall function.  MODIFICATION OR ASSISTANCE TO COMPLETE EVALUATION: Min-Moderate modification of tasks or assist with assess necessary to complete an evaluation.  OT OCCUPATIONAL PROFILE AND HISTORY: Detailed assessment: Review of records and additional review of physical, cognitive, psychosocial history related to current functional performance.  CLINICAL DECISION MAKING: Moderate - several  treatment options, min-mod task modification necessary  REHAB POTENTIAL: Good  EVALUATION COMPLEXITY: Moderate    PLAN:  OT FREQUENCY: 2x/week  OT DURATION: 12 weeks  PLANNED INTERVENTIONS: 97168 OT Re-evaluation, 97535 self care/ADL training, 16109 therapeutic exercise, 97530 therapeutic activity, 97112 neuromuscular re-education, 97140 manual therapy, 97018 paraffin, 60454 moist heat, 97010 cryotherapy, 97034 contrast bath, 97760 Orthotic Initial, 97763 Orthotic/Prosthetic subsequent, passive range of motion, visual/perceptual remediation/compensation, energy conservation, and DME and/or AE instructions  RECOMMENDED OTHER SERVICES: PT and ST  CONSULTED AND AGREED WITH PLAN OF CARE: Patient and family member/caregiver  PLAN FOR NEXT SESSION: see above  Duey Ghent, MS, OTR/L 09/03/2023, 6:55 PM

## 2023-09-03 NOTE — Therapy (Unsigned)
 OUTPATIENT SPEECH LANGUAGE PATHOLOGY  COGNITION TREATMENT NOTE   Patient Name: [name]. MRN: [id number] DOB:1951/02/19, 73 y.o., male Today's Date: 09/03/2023  PCP: Glenora Laos, MD REFERRING PROVIDER: Georjean Kite, PA-C   End of Session - 09/03/23 1533     Visit Number 5    Number of Visits 25    Date for SLP Re-Evaluation 11/12/23    Authorization Type [employer]    Authorization Time Period 08/20/2023 thru 10/15/2023    Authorization - Visit Number 5    Authorization - Number of Visits 16    Progress Note Due on Visit 10    SLP Start Time 1450    SLP Stop Time  1521    SLP Time Calculation (min) 31 min    Activity Tolerance Patient tolerated treatment well             Past Medical History:  Diagnosis Date   Anemia    Cervical myelopathy (HCC)    Chronic venous insufficiency    [name] (chronic kidney disease), stage III (HCC)    Diabetes mellitus without complication (HCC)    GERD (gastroesophageal reflux disease)    Hypercholesteremia    Hypothyroidism    Kidney stones    about 50 in the past--last in March 4/24   Leg weakness, bilateral    due to Myasthenia Gravis   Myasthenia gravis (HCC)    Spinal stenosis    Vitamin B 12 deficiency    Vitamin B 12 deficiency    Wears hearing aid    bilateral   Past Surgical History:  Procedure Laterality Date   BACK SURGERY  09/2015   CARDIAC CATHETERIZATION     CATARACT EXTRACTION W/ INTRAOCULAR LENS IMPLANT     CATARACT EXTRACTION W/PHACO Right 12/21/2015   Procedure: CATARACT EXTRACTION PHACO AND INTRAOCULAR LENS PLACEMENT (IOC);  Surgeon: Annell Kidney, MD;  Location: [facility];  Service: Ophthalmology;  Laterality: Right;  DIABETIC - insulin  and oral meds   COLONOSCOPY     SHOULDER SURGERY     labrum repair   TONSILLECTOMY     Patient Active Problem List   Diagnosis Date Noted   High blood pressure 08/13/2023   Anemia 08/13/2023   Constipation 08/13/2023    Myasthenia gravis (HCC) 08/13/2023   Acute ischemic right MCA stroke (HCC) 07/19/2023   Lymphedema 12/27/2016   Leg pain 07/15/2016   Chronic venous insufficiency 07/15/2016   Swelling of limb 07/15/2016   Type 2 diabetes mellitus (HCC) 07/15/2016   Hyperlipidemia 07/15/2016    ONSET DATE: 07/19/2023   REFERRING DIAG: W09.811 (ICD-10-CM) - Cerebral infarction due to unspecified occlusion or stenosis of right middle cerebral artery  THERAPY DIAG:  Cognitive communication deficit  Rationale for Evaluation and Treatment Rehabilitation  SUBJECTIVE:   PERTINENT HISTORY and DIAGNOSTIC FINDINGS:   [name] is a 73 y.o. 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  CT Brain w/o contrast: 07/14/2023 New hypodensity within the right periventricular white matter extending into the inferior frontal lobe/operculum compatible with a recent infarct.   MRI Brain w/o contrast: 07/15/2023 IMPRESSION:  Acute right MCA distribution infarct involving the right periventricular white matter extending into the right frontal and parietal lobes and insula , some of which correlates to the recent CT finding. No significant associated mass effect or associated hemorrhage.   MRA  head angiogram 07/16/2023 IMPRESSION:  Focal occlusion of the right inferior division distal M2 MCA (6:123) in keeping with known right MCA infarct when compared to prior MRI brain 07/15/2023.   CT head 07/17/2023 IMPRESSION:  Expansion of territory involved in the right MCA infarct compared with MRI of 07/15/2023. Increasing local mass effect with new leftward midline shift of 3 mm. No hemorrhage.   Speech Language Evaluation at [facility] 07/19/2023 Patient presents with cognitive deficits in the areas of problem solving and left  inattention as evidenced throughout patient/family interview and performance on Cognistat evaluation. Patient further demonstrates impairments in the areas of intellectual, anticipatory, and emergent awareness of deficits/mistakes.   Speech Language Evaluation at [facility] 07/31/2023 Skilled therapy session focused on re-evaluation of cognitive skills utilizing the CLQT standardized assessment. Patient scored with overall mild cognitive deficits in the subtests of attention, executive functioning and visual spatial skills. Patient scored WFL on the clock drawing task, language and memory.   PAIN:  Are you having pain? No   FALLS: Has patient fallen in last 6 months?  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 SLP facilitated session by   Reports that he sent several text message and wife looked over them, went well  Brought in list forwarded some emails, and his wife reports that he was able to locate various specifically requested information without any incidences of visuospatial difficulty  When this writer requested pt to locate apps/information on his phone, he was able to enter pass code and locate information correctly. Pt also able to sort by shapes but reports that he has always "hated" puzzles and had never been good at them even before the CVA. Suspect this is reason pt was not successful in recreating  parquetry designs.  Skilled verbal education provided on utilize alarm on clock to remind pt to perform hand exercises to help with swelling. With intermittent Min A to supervision support, pt able to successful set 4 alarms with label    PATIENT REPORTED OUTCOME MEASURES (PROM): To be completed over the next 3 sessions   PATIENT EDUCATION: Education details: see above Person educated: Patient and Spouse Education method: Explanation Education comprehension: needs further education   HOME EXERCISE PROGRAM:   See above for details    GOALS:  Goals reviewed with patient? Yes  SHORT TERM GOALS: Target date: 10 sessions  The patient will complete a simple word search puzzle (66 or smaller) within 10 minutes given intermittent moderate verbal cues. Baseline: Goal status: INITIAL  2.  The patient will recall sentence-level information after a 5-minute delay using the spaced retrieval technique. Baseline:  Goal status: INITIAL  3.  With moderate cues, pt will identify 3 cognitive strengths/weaknesses during tasks.  Baseline:  Goal status: INITIAL  LONG TERM GOALS: Target date: 11/12/2023  The patient will read sentence-level information aloud at 80% accuracy given intermittent minimal verbal cues in order to increase ability to safely live independently. Baseline:  Goal status: INITIAL  2.   With Min A, patient will use external aid for functional recall of completed/upcoming activities 75% accuracy.     Baseline:  Goal status: INITIAL  3.   With Min A, patient will complete semi-complex visual memory tasks with 75% accuracy independently.   Baseline:  Goal status: INITIAL   ASSESSMENT:  CLINICAL IMPRESSION: Patient is a 73 y.o. right handed male who was seen today for a cognitive communication treatment d/t right MCA CVA. Pt presents with overall  moderate cognitive impairment with severe deficits in delayed memory, severe deficits in visuospatial/constructional  abilities (apart from left inattention), decreased awareness of deficits/errors and mild deficits in higher level attention. Immediate memory, language and selective attention are relative strengths for pt.   See the above treatment note for details.   OBJECTIVE IMPAIRMENTS include attention, memory, awareness, and executive functioning. These impairments are limiting patient from return to work, managing medications, managing appointments, managing finances, household responsibilities, and ADLs/IADLs. Factors affecting potential to achieve goals and functional outcome are severity of impairments. Patient will benefit from skilled SLP services to address above impairments and improve overall function.  REHAB POTENTIAL: Good  PLAN: SLP FREQUENCY: 1-2x/week  SLP DURATION: 12 weeks  PLANNED INTERVENTIONS: Cognitive reorganization, Internal/external aids, Functional tasks, SLP instruction and feedback, Compensatory strategies, and Patient/family education   Joselinne Lawal B. Garlin Junker, M.S., CCC-SLP, Tree surgeon Certified Brain Injury Specialist [facility]  [facility] Services Office [phone] Ascom [phone] Fax [phone]

## 2023-09-05 ENCOUNTER — Ambulatory Visit: Admitting: Occupational Therapy

## 2023-09-05 ENCOUNTER — Ambulatory Visit

## 2023-09-05 ENCOUNTER — Ambulatory Visit: Admitting: Speech Pathology

## 2023-09-05 DIAGNOSIS — R41841 Cognitive communication deficit: Secondary | ICD-10-CM

## 2023-09-05 DIAGNOSIS — M6281 Muscle weakness (generalized): Secondary | ICD-10-CM

## 2023-09-05 DIAGNOSIS — R262 Difficulty in walking, not elsewhere classified: Secondary | ICD-10-CM

## 2023-09-05 DIAGNOSIS — R2681 Unsteadiness on feet: Secondary | ICD-10-CM

## 2023-09-05 DIAGNOSIS — R278 Other lack of coordination: Secondary | ICD-10-CM
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
## 2023-09-05 NOTE — Therapy (Signed)
 OUTPATIENT OCCUPATIONAL THERAPY NEURO TREATMENT  Patient Name: [name]. MRN: [id number] DOB:Oct 20, 1950, 73 y.o., male Today's Date: 09/05/2023  PCP: Arvel Birch, MD REFERRING PROVIDER: Leotha Rang, PA-C  END OF SESSION:  OT End of Session - 09/05/23 2243     Visit Number 6    Number of Visits 24    Date for OT Re-Evaluation 11/12/23    OT Start Time 1615    OT Stop Time 1700    OT Time Calculation (min) 45 min    Activity Tolerance Patient tolerated treatment well    Behavior During Therapy WFL for tasks assessed/performed         Past Medical History:  Diagnosis Date   Anemia    Cervical myelopathy (HCC)    Chronic venous insufficiency    [name] (chronic kidney disease), stage III (HCC)    Diabetes mellitus without complication (HCC)    GERD (gastroesophageal reflux disease)    Hypercholesteremia    Hypothyroidism    Kidney stones    about 50 in the past--last in March 4/24   Leg weakness, bilateral    due to Myasthenia Gravis   Myasthenia gravis (HCC)    Spinal stenosis    Vitamin B 12 deficiency    Vitamin B 12 deficiency    Wears hearing aid    bilateral   Past Surgical History:  Procedure Laterality Date   BACK SURGERY  09/2015   CARDIAC CATHETERIZATION     CATARACT EXTRACTION W/ INTRAOCULAR LENS IMPLANT     CATARACT EXTRACTION W/PHACO Right 12/21/2015   Procedure: CATARACT EXTRACTION PHACO AND INTRAOCULAR LENS PLACEMENT (IOC);  Surgeon: Annell Kidney, MD;  Location: [facility];  Service: Ophthalmology;  Laterality: Right;  DIABETIC - insulin  and oral meds   COLONOSCOPY     SHOULDER SURGERY     labrum repair   TONSILLECTOMY     Patient Active Problem List   Diagnosis Date Noted   High blood pressure 08/13/2023   Anemia 08/13/2023   Constipation 08/13/2023   Myasthenia gravis (HCC) 08/13/2023   Acute ischemic right MCA stroke (HCC) 07/19/2023   Lymphedema 12/27/2016   Leg pain 07/15/2016   Chronic venous  insufficiency 07/15/2016   Swelling of limb 07/15/2016   Type 2 diabetes mellitus (HCC) 07/15/2016   Hyperlipidemia 07/15/2016   ONSET DATE: 07/13/2023  REFERRING DIAG: Acute ischemic R MCA CVA  THERAPY DIAG: muscle weakness (generalized), other lack of coordination, vision disturbance, hemiplegia and hemiparesis following cerebral infarction affecting left non-dominant side (HCC)   Rationale for Evaluation and Treatment: Rehabilitation  SUBJECTIVE:  SUBJECTIVE STATEMENT: Pt. Reports that his brother passed away, and they have been arranging a funeral. Pt accompanied by: significant other  PERTINENT HISTORY: Pt. Was admitted to [facility] on 07/13/2023 after sustaining a CVA while on a trip to the beach. Pt. Was diagnosed with R MCA CVA. Pt. Was admitted to inpatient rehab from 07/19/2023-08/13/2023. Past medical history includes high BP, anemia, and Myasthenia Gravis.   PRECAUTIONS: None  WEIGHT BEARING RESTRICTIONS: No  PAIN:  Are you having pain? 1-2/10 at the dorsal forearm in the left wrist flexors.  FALLS: Has patient fallen in last 6 months? Yes  LIVING ENVIRONMENT: Lives with: lives with their family and lives with their spouse-  Lives in: House/apartment- 2 story home and resides mostly on the 1st floor Stairs: No- Ramped entrance Has following equipment at home:  Otho Blitz, cane, Steadi, shower chair, handheld shower head, bedside commode  PLOF: Independent and  Independent with basic ADLs Independent at home   PATIENT GOALS: Would like to walk 10-20 feet each time.   OBJECTIVE:  Note: Objective measures were completed at Evaluation unless otherwise noted.  HAND DOMINANCE: Right  ADLs: Overall ADLs:  Transfers/ambulation related to ADLs: Eating: Independent, 100% R handed Grooming: independent UB Dressing: Can put on shirt independently LB Dressing: Difficulty with pants and requires assistance getting the pants on his feet, has difficulty putting on shoes and  socks Toileting: Tot A for toilet hygiene. Uses Steadi during toilet transfers. Bathing: Requires assist with using the L arm to wash the R arm. Tub Shower transfers: Roll in shower, built in shower chair, grab bars and hand held shower head.  IADLs: Shopping: Total A (Wife typically did the shopping prior to onset)   Light housekeeping: Total A (wife typically performs prior to onset) Meal Prep: Independent with light snack prep Community mobility: No driving, able to get in/out of the car Medication management: Set up assistance from his wife using a pill box (pt. Is responsible for taking medications at the correct time) Financial management: family members check behind him. Handwriting: TBD Hobbies: gardening, wood working and sports- [employer] Work history: Owns a tax business MOBILITY STATUS: Needs Assist:    POSTURE COMMENTS:   Sitting balance: Good supported sitting balance  FUNCTIONAL OUTCOME MEASURES: TBD   UPPER EXTREMITY ROM:    Active ROM Right Eval  WFL Left eval  Shoulder flexion  89(110)  Shoulder abduction  98(120)  Shoulder adduction    Shoulder extension    Shoulder internal rotation    Shoulder external rotation    Elbow flexion  120(140)  Elbow extension  -28(-10)  Wrist flexion  60(60) Rest at 60  Wrist extension  -48(42)  Wrist ulnar deviation    Wrist radial deviation    Wrist pronation    Wrist supination    (Blank rows = not tested)  L Digit flexion: 2nd:3cm (0cm) 3rd: 0cm (0cm), 4th: 0cm (0cm), 5th: 2cm (0cm)  L Digit extension: Active Gross digit extension through 10% of ROM UPPER EXTREMITY MMT:     MMT Right eval Left eval  Shoulder flexion 5 3-  Shoulder abduction 5 3-  Shoulder adduction    Shoulder extension    Shoulder internal rotation    Shoulder external rotation    Middle trapezius    Lower trapezius    Elbow flexion 5 TBD  Elbow extension 5 TBD  Wrist flexion    Wrist extension  2-  Wrist ulnar deviation    Wrist  radial deviation    Wrist pronation    Wrist supination    (Blank rows = not tested)  HAND FUNCTION:   COORDINATION:   SENSATION: Sensation feels different in both hands.  L hand Light touch- impaired L hand Proprioception- impaired  EDEMA: Has had hx of edema, and came to Eval session with swelling in the L hand/wrist/forearm.   *pt. Has a laceration from his dog on the dorsal aspect of the L hand.   MUSCLE TONE: Increased tone through the UE and hand flexors.   COGNITION: Overall cognitive status: Within functional limits for tasks assessed  VISION: Subjective report: L sided inattention Baseline vision: Wears glasses for reading only Visual history: Right visual occlusion  VISION ASSESSMENT: To be assessed   PERCEPTION: TBD  PRAXIS: Impaired  TREATMENT DATE: 09/05/2023  Manual Therapy:   -Pt. tolerated left scapular mobilizations for elevation, depression, abduction/rotation to normalize tone, and prepare the UE for ROM.   -Pt. tolerated retrograde massage to the Left hand for edema control 2/2 increased edema.  -Manual therapy was performed independent of, and in preparation for therapeutic Ex.    Therapeutic Exercise:  -AAROM/AROM stretching performed throughout the LUE, including L shoulder all planes within pain free ranges, elbow flex/ext, forearm pron/sup, wrist flex/ext, and digit flex/ext.  -Focus on increasing range for L shoulder ER, forearm sup, and wrist ext, with review of self passive stretching techniques; pt able to return demo with min vc for technique with cues   Therapeutic Activities:  -facilitated combinations of LUE movements through diagonal patterns in preparation for self-grooming tasks with hand-over assist. -facilitated LUE functional reaching for cones moving the UE through multiple planes to place the cones on multiple  tabletop surfaces. -Pt. Required support proximally   -Pt. Worked on reaching for, and grasping 1 cubes, sustaining the grasp during wrist extension, then moving the left UE to his side in preparation for actively releasing the cubes.     PATIENT EDUCATION: Education details:  LUE ROM, Edema control techniques, and  functional reaching tasks, LUE  grasp, release, wrist extension Person educated: Patient and Spouse Education method: Explanation, Demonstration, Tactile cues, and Verbal cues Education comprehension: verbalized understanding, returned demonstration, and needs further education  HOME EXERCISE PROGRAM: Self passive stretching throughout the LUE; grasp/release activities   GOALS: Goals reviewed with patient? Yes  SHORT TERM GOALS: Target date: 10/01/2023   Pt. Will be independent with HEP for the LUE.  Baseline: Eval; no current HEP Goal status: INITIAL   LONG TERM GOALS: Target date: 11/12/2023  Pt. Will increase L shoulder flexion by 10 degrees to be able to reach up to shelves.  Baseline: Eval; L shoulder flexion is 89 (110). Goal status: INITIAL  2.  Pt. Will increase L shoulder abduction by 10 degrees to assist with underarm hygiene.  Baseline: Eval: L shoulder abduction is 98 (120).  Goal status: INITIAL  3.  Pt. Will improve L wrist extension by 10 degrees to be able to initiate anticipation of grasping for objects.  Baseline: Eval: -48 (42) Goal status: INITIAL  4.  Pt. Will improve active gross digit extension through 25% of the range to be able to release objects in his hand consistently.  Baseline: Eval: gross digit extension is 10% of the range  Goal status: INITIAL  5.  Pt. Will independently demonstrate visual compensatory strategies when navigating through environment and during tabletop tasks. Baseline: Pt. Requires consistent cuing for L sided awareness.  Goal status: INITIAL  ASSESSMENT:  CLINICAL IMPRESSION:  Pt. Presents with  improving edema in the left hand. Pt. consistently presents with  increased active left  wrist wrist extension during the tabletop tasks. Pt. presents with consistent active thumb movements  in preparation for grasping, and releasing 1 objects from a lateral grasp. Pt. required the left hand to be at his side in order to release the cubes today.  Pt continues to benefit from OT services to work on improving LUE functioning to increase engagement of the LUE during daily task, and to provide education about compensatory strategies during ADLs/IADLs.  PERFORMANCE DEFICITS: in functional skills including ADLs, IADLs, coordination, dexterity, proprioception, sensation, edema, tone, ROM, strength, pain, Fine motor control, Gross motor control, mobility, balance, endurance, vision, and UE functional use, cognitive skills including perception, and psychosocial  skills including coping strategies, environmental adaptation, and routines and behaviors.   IMPAIRMENTS: are limiting patient from ADLs, IADLs, and leisure.   CO-MORBIDITIES: may have co-morbidities  that affects occupational performance. Patient will benefit from skilled OT to address above impairments and improve overall function.  MODIFICATION OR ASSISTANCE TO COMPLETE EVALUATION: Min-Moderate modification of tasks or assist with assess necessary to complete an evaluation.  OT OCCUPATIONAL PROFILE AND HISTORY: Detailed assessment: Review of records and additional review of physical, cognitive, psychosocial history related to current functional performance.  CLINICAL DECISION MAKING: Moderate - several treatment options, min-mod task modification necessary  REHAB POTENTIAL: Good  EVALUATION COMPLEXITY: Moderate    PLAN:  OT FREQUENCY: 2x/week  OT DURATION: 12 weeks  PLANNED INTERVENTIONS: 97168 OT Re-evaluation, 97535 self care/ADL training, 08657 therapeutic exercise, 97530 therapeutic activity, 97112 neuromuscular re-education, 97140  manual therapy, 97018 paraffin, 84696 moist heat, 97010 cryotherapy, 97034 contrast bath, 97760 Orthotic Initial, 97763 Orthotic/Prosthetic subsequent, passive range of motion, visual/perceptual remediation/compensation, energy conservation, and DME and/or AE instructions  RECOMMENDED OTHER SERVICES: PT and ST  CONSULTED AND AGREED WITH PLAN OF CARE: Patient and family member/caregiver  PLAN FOR NEXT SESSION: see above  Duey Ghent, MS, OTR/L 09/05/2023, 10:48 PM

## 2023-09-05 NOTE — Therapy (Signed)
 OUTPATIENT PHYSICAL THERAPY NEURO TREATMENT   Patient Name: [name]. MRN: [id number] DOB:[birth year], 73 y.o., male Today's Date: 09/05/2023   PCP: Douglas Laos, MD  REFERRING PROVIDER:    Sterling Eisenmenger, PA-C    END OF SESSION:        Past Medical History:  Diagnosis Date   Anemia    Cervical myelopathy (HCC)    Chronic venous insufficiency    [name] (chronic Wright disease), stage III (HCC)    Diabetes mellitus without complication (HCC)    GERD (gastroesophageal reflux disease)    Hypercholesteremia    Hypothyroidism    Wright stones    about 50 in the past--last in March 4/24   Leg weakness, bilateral    due to Myasthenia Gravis   Myasthenia gravis (HCC)    Spinal stenosis    Vitamin B 12 deficiency    Vitamin B 12 deficiency    Wears hearing aid    bilateral   Past Surgical History:  Procedure Laterality Date   BACK SURGERY  09/2015   CARDIAC CATHETERIZATION     CATARACT EXTRACTION W/ INTRAOCULAR LENS IMPLANT     CATARACT EXTRACTION W/PHACO Right 12/21/2015   Procedure: CATARACT EXTRACTION PHACO AND INTRAOCULAR LENS PLACEMENT (IOC);  Surgeon: Douglas Kidney, MD;  Location: [facility];  Service: Ophthalmology;  Laterality: Right;  DIABETIC - insulin  and oral meds   COLONOSCOPY     SHOULDER SURGERY     labrum repair   TONSILLECTOMY     Patient Active Problem List   Diagnosis Date Noted   High blood pressure 08/13/2023   Anemia 08/13/2023   Constipation 08/13/2023   Myasthenia gravis (HCC) 08/13/2023   Acute ischemic right MCA stroke (HCC) 07/19/2023   Lymphedema 12/27/2016   Leg pain 07/15/2016   Chronic venous insufficiency 07/15/2016   Swelling of limb 07/15/2016   Type 2 diabetes mellitus (HCC) 07/15/2016   Hyperlipidemia 07/15/2016    ONSET DATE:   REFERRING DIAG:  Diagnosis  I63.511 (ICD-10-CM) - Cerebral infarction due to unspecified occlusion or stenosis of right middle cerebral artery    THERAPY DIAG:   No diagnosis found.  Rationale for Evaluation and Treatment: Rehabilitation  SUBJECTIVE:                                                                                                                                                                                             SUBJECTIVE STATEMENT: Pt reports no changes since last seen. Pt reports hand is somewhat sore.    PREVIOUS Pt states, during car transfers, he cannot lift his L LE up over the  threshold to place it in the vehicle without significant assistance from his wife. Pt states in his taller vehicles, he used to rely on pulling up with his R hand on car handle and pushing up through R LE on running board to help him transfer into the vehicle. Pt asking if he can change the way he transfers in/out of the car and step-up with his R LE instead - therapist states she would need to perform car transfers with him during a session in order to advise on a safe transfer technique. [name], wife, states she is concerned about how pt will turn and cross over his L LE to lift it up into the car when pt steps up with R LE. Pt states he has have only ridden in their [employer]ox, but they also have a [phone] that is similar size/height. Prior to CVA, pt reports he had trouble having enough room in the Equinox for his LEs, due to his height.    Pt and wife state that despite these concerns, they feel focusing on patient's walking during physical therapy session today is more important. Both decline having therapist assist with practicing car transfers using the [employer].  Pt had PM&R NP visit yesterday 09/02/2023: Pt states the appointment went well. No changes to his medications. (The note is still incomplete in pt's chart at this time)  Pt denies stumbles/falls. Pt states L LE doesn't feel weak today, but states every once in a while it wants to buckle a little bit (reports it has only done it 1x in the past week).   Pt states  he doesn't usually wear his L swedish knee cage at home because he doesn't have trouble with his transfers without it. Reports the brace is also too tight on his leg so he cannot leave it donned for risk of skin breakdown.   Pt states he didn't enjoy using litegait for gait training during last session because he felt it was moving all over the place making it harder for him to walk.   Pt reports his goal is to learn to walk again if at all possible, even if it is only 10 feet, and he wants to focus on walking during every PT session.   From Initial Eval: Pt reports that he is doing well. Has not been moving much since discharging from [facility], as therapists instructed pt to perform transfers only at home due to high fall risk with ambulation. Continues to have significant weakenss in the LUE with difficulty extending fingers as well as numbness and inattention to the L side of body resulting in multiple cuts on Arm and leg with WC mobility in home.  Will be attaining custom power WC from [employer]. Has loaner currently.   Pt accompanied by: significant other [name]   PERTINENT HISTORY: [name]. is a 73 y.o. male with history of T2DM, HTN, [name], renal calculi, seropositive myasthenia gravis who was admitted to [facility] on 07/14/2023 with reports 3 to 4-day history of stuttering speech and fluctuating LUE weakness followed by fall.  He reported some double vision and per discussion with his neurology I had increased prednisone  to 10 mg/day due to concerns of MG flare.  He was found to have left facial weakness with dysarthria as well as LUE and LLE weakness.  MRI brain done revealing acute right MCA infarction involving right periventricular white matter and extending into right frontal and parietal lobes.    Douglas Arta Lark  [name]. was admitted to rehab 07/19/2023 for inpatient therapies to consist of PT, ST and OT at least three hours five days a week. Past admission physiatrist, therapy team and rehab  RN have worked together to provide customized collaborative inpatient rehab.  He continues on low-dose aspirin  and subcu Lovenox  was used for DVT prophylaxis during his stay.  His blood pressures were monitored on TID basis and has been stable on current regimen.  Serial check of CBC shows H&H to be relatively stable stool guaiac was negative x 1.  He continues to have lower extremity edema and was educated on importance of elevation for edema control.  Constipation has resolved with sorbitol  use on intermittent basis.  P.o. intake has been good.  He was noted to have Candida crura's which was treated with course of Diflucan.  MASD has resolved with use of Gerhardt's Butt cream.   Left hemiplegia as been a limiting factor with left knee pain noted with increase in activity.  EKG was added for support and pain management.  His diabetes has been monitored with ac/hs CBG checks and SSI was use prn for tighter BS control.  Metformin  was resumed at 5 mg twice daily and basal insulin  was slowly titrated upwards to 26 units.  Blood sugars continue to be variable and he was advised to increase metformin  to home dose and titrate insulin  glargine slowly if blood sugars range over 150.  He has made steady gains during his stay and continues to be limited by mild left inattention with left knee plegia and weakness. He has been evaluated for  speciality wheelchair by [employer]. He will continue to receive follow up outpatient PT, OT and ST at [facility] after discharge.   PAIN:  Are you having pain? No and reports Numbness in the L H and and Leg   PRECAUTIONS: Fall  **reports gets rash from contact with latex  RED FLAGS: Hx of MG   WEIGHT BEARING RESTRICTIONS: No  FALLS: Has patient fallen in last 6 months? Yes. Number of falls 1  LIVING ENVIRONMENT: Lives with: lives with their spouse Lives in: House/apartment Stairs: Ramps installed while in the Hospital  Has following equipment at home: Otho Blitz -  2 wheeled and Wheelchair (power)  PLOF: Independent with basic ADLs, Independent with household mobility without device, and Independent with community mobility with device with [facility]   PATIENT GOALS: relearn to Walk 15-59ft.   OBJECTIVE:  Note: Objective measures were completed at Evaluation unless otherwise noted.  DIAGNOSTIC FINDINGS: MRI brain done revealing acute right MCA infarction involving right periventricular white matter and extending into right frontal and parietal lobes.  COGNITION: Overall cognitive status: Impaired poor awareness of L side while dual tasking.    SENSATION: Light touch: Impaired   COORDINATION: Limited ROM and strength on the LUE limiting finger to nose.  ROM and strength deficits limit ankle to knee.   EDEMA:  Mild edema in the L UE.   MUSCLE TONE: LLE: Mild    POSTURE: flexed trunk. Lateral lean to the L   LOWER EXTREMITY ROM:     Active  Right Eval Left Eval  Hip flexion    Hip extension    Hip abduction    Hip adduction    Hip internal rotation    Hip external rotation    Knee flexion    Knee extension    Ankle dorsiflexion    Ankle plantarflexion    Ankle inversion    Ankle eversion     (  Blank rows = not tested)  LOWER EXTREMITY MMT:    MMT Right Eval Left Eval  Hip flexion 4+ 4-  Hip extension 4+ 3+  Hip abduction 4+ 3+  Hip adduction 4+ 3+  Hip internal rotation    Hip external rotation    Knee flexion 5 4-  Knee extension 5 4-  Ankle dorsiflexion 5 4  Ankle plantarflexion    Ankle inversion    Ankle eversion    (Blank rows = not tested)  BED MOBILITY:  Findings: Sit to supine Mod A and Max A Supine to sit Mod A and Max A  TRANSFERS: Sit to stand: Min A and Mod A  Assistive device utilized: Environmental consultant - 2 wheeled     Stand to sit: Min A  Assistive device utilized: Environmental consultant - 2 wheeled     Chair to chair: Min A and Mod A  Assistive device utilized: Environmental consultant - 2 wheeled      PT required to stabilize the LUE on RW  as well as provide max cues for attention to hand to prevent sliding off hand grip.  At home Pt utilizes hand splint for stability on RW to prevent loss of grip in transfers.   RAMP:  Not tested  CURB:  Not tested  STAIRS: Not tested GAIT: Findings: Gait Characteristics: step to pattern, decreased hip/knee flexion- Left, Left foot flat, ataxic, lateral lean- Left, wide BOS, and poor foot clearance- Left, Distance walked: 77ft, Assistive device utilized:Walker - 2 wheeled, Level of assistance: Mod A and Max A, and Comments: poor awareness of lateral/anterior L LOB as well as poor awareness of LUE causing loos of grip on RW and heavy lateral LOB to the L, +2 assist required to prevent fall and position chair under patient as he attempted turn at 25ft.    FUNCTIONAL TESTS:  5 times sit to stand: 1:29 min Timed up and go (TUG): unable to complete turn at 36ft.  10 meter walk test: unable to complete on this day.   PATIENT SURVEYS:  Stroke Impact Scale 49/80                                                                                                                              TREATMENT DATE: 09/05/2023   TA: to promote LE strength for increased safety with transfers, gait, ADL ability  STS from transport chair>RW> standard chair with min assist + 2  Seated march 3x10 each LE   rates medium +   Seated adductor squeeze with pball 3x10 BLE   Seated Hamstring curl with pillow case under LLE and occ PT assist into increased L knee flex 2x15  Seated RTB (latex free) hip abduction/ER 3x12 BLE   Seated crunch 2x10   STS from transport >RW>ambulate 6 ft>stand to sit in standard chair with min assist + 2  PATIENT EDUCATION: Education details: Pt educated throughout session about proper posture and technique with exercises. Improved exercise  technique, movement at target joints, use of target muscles after min to mod verbal, visual, tactile cues.  Person educated: Patient and  Spouse Education method: Explanation, VC/TC/Demo Education comprehension: verbalized understanding  HOME EXERCISE PROGRAM:  To be provided at upcoming visits  GOALS: Goals reviewed with patient? Yes  SHORT TERM GOALS: Target date: 09/25/2023    Patient will be independent in home exercise program to improve strength/mobility for better functional independence with ADLs. Baseline: To be provided at upcoming visits Goal status: INITIAL   LONG TERM GOALS: Target date: 11/13/2023    Patient will increase SIS-16 score to equal to or greater than 10points    to demonstrate statistically significant improvement in mobility and quality of life.  Baseline: 49/80 Goal status: INITIAL  2.  Patient (> 10 years old) will complete five times sit to stand test in < 15 seconds indicating an increased LE strength and improved balance. Baseline: 1:73min Goal status: INITIAL  3.  Patient will increase FIST score by > 6 points to demonstrate decreased fall risk during functional activities Baseline:  43/56  Goal status: INITIAL  4.  Patient will increase 10 meter walk test to >0.27m/s as to improve gait speed for better community ambulation and to reduce fall risk. Baseline: unable to complete  Goal status: INITIAL  5.  Patient will reduce timed up and go to <11 seconds to reduce fall risk and demonstrate improved transfer/gait ability. Baseline: unable to complete turn at 10 ft. Mod-max assist due to poor control of LW on RW, resulting in L lateral LOB.  Goal status: INITIAL   ASSESSMENT:  CLINICAL IMPRESSION:  PT able to see pt today with last-minute appointment change/fits into shorter appointment slot. Focus of session was on seated LE strengthening/endurance needed for improved ease with mobility, gait, transfers & ADLs. Pt with difficulty throughout maintaining upright seated posture in chair due to fatigue/core weakness, so added in additional seated core intervention. Pt was able to  ambulate short distance with RW (approx 6 ft) following interventions. Pt will benefit from skilled PT to address strength, balance, coordination, attention deficits to improve function and overall QoL and return to PLOF.   OBJECTIVE IMPAIRMENTS: Abnormal gait, cardiopulmonary status limiting activity, decreased activity tolerance, decreased balance, decreased cognition, decreased coordination, decreased endurance, decreased knowledge of condition, decreased knowledge of use of DME, decreased mobility, difficulty walking, decreased ROM, decreased strength, decreased safety awareness, dizziness, hypomobility, increased fascial restrictions, impaired perceived functional ability, increased muscle spasms, impaired sensation, impaired tone, impaired UE functional use, impaired vision/preception, improper body mechanics, postural dysfunction, and obesity.   ACTIVITY LIMITATIONS: carrying, lifting, bending, sitting, standing, squatting, stairs, transfers, bed mobility, bathing, toileting, dressing, reach over head, hygiene/grooming, locomotion level, and caring for others  PARTICIPATION LIMITATIONS: meal prep, cleaning, laundry, medication management, interpersonal relationship, driving, shopping, community activity, and yard work  PERSONAL FACTORS: Age, Past/current experiences, Time since onset of injury/illness/exacerbation, and 3+ comorbidities: MG, HTN, CVA, lymphedema are also affecting patient's functional outcome.   REHAB POTENTIAL: Good  CLINICAL DECISION MAKING: Unstable/unpredictable  EVALUATION COMPLEXITY: High  PLAN:  PT FREQUENCY: 1-2x/week  PT DURATION: 12 weeks  PLANNED INTERVENTIONS: 97164- PT Re-evaluation, 97750- Physical Performance Testing, 97110-Therapeutic exercises, 97530- Therapeutic activity, [id number]- Neuromuscular re-education, 97535- Self Care, 16109- Manual therapy, [id number]- Gait training, [id number]- Orthotic Initial, [id number]- Orthotic/Prosthetic subsequent, [phone]- Canalith  repositioning, U9811- Electrical stimulation (unattended), [id number]- Electrical stimulation (manual), [id number]- Wound care (first 20 sq cm), 91478- Wound care (each additional 20 sq cm), Patient/Family education, Balance training,  Stair training, Taping, Dry Needling, Joint mobilization, Joint manipulation, Vestibular training, Visual/preceptual remediation/compensation, Cognitive remediation, DME instructions, Wheelchair mobility training, Cryotherapy, and Moist heat  PLAN FOR NEXT SESSION:  TUG Initiate HEP Gait training with L hand splint on bari-RW  Work on midline orientation and R weight shifting Work on L LE coordination and strength for improved swing phase advancement Provide HEP   Aminta Kales PT, DPT  Physical Therapist - [place]  [facility]  3:42 PM 09/05/23

## 2023-09-09 ENCOUNTER — Ambulatory Visit: Admitting: Speech Pathology

## 2023-09-09 ENCOUNTER — Ambulatory Visit: Admitting: Physical Therapy

## 2023-09-09 ENCOUNTER — Telehealth: Payer: Self-pay | Admitting: Registered Nurse

## 2023-09-09 ENCOUNTER — Ambulatory Visit

## 2023-09-09 ENCOUNTER — Other Ambulatory Visit: Payer: Self-pay | Admitting: Registered Nurse

## 2023-09-09 DIAGNOSIS — R2681 Unsteadiness on feet: Secondary | ICD-10-CM

## 2023-09-09 DIAGNOSIS — M6281 Muscle weakness (generalized): Secondary | ICD-10-CM

## 2023-09-09 DIAGNOSIS — H539 Unspecified visual disturbance: Secondary | ICD-10-CM

## 2023-09-09 DIAGNOSIS — I69354 Hemiplegia and hemiparesis following cerebral infarction affecting left non-dominant side: Secondary | ICD-10-CM

## 2023-09-09 DIAGNOSIS — R278 Other lack of coordination: Secondary | ICD-10-CM

## 2023-09-09 DIAGNOSIS — R41841 Cognitive communication deficit: Secondary | ICD-10-CM

## 2023-09-09 DIAGNOSIS — R262 Difficulty in walking, not elsewhere classified: Secondary | ICD-10-CM

## 2023-09-09 DIAGNOSIS — R269 Unspecified abnormalities of gait and mobility: Secondary | ICD-10-CM

## 2023-09-09 NOTE — Telephone Encounter (Signed)
 Patients wife called in , states pharmacy has informed them that they have no received script for atorvastatin  (LIPITOR ) 80 MG tablet
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
## 2023-09-09 NOTE — Therapy (Signed)
 OUTPATIENT PHYSICAL THERAPY NEURO TREATMENT   Patient Name: [name]. MRN: [id number] DOB:11/03/1950, 73 y.o., male Today's Date: 09/09/2023   PCP: Glenora Laos, MD  REFERRING PROVIDER:    Sterling Eisenmenger, PA-C    END OF SESSION:   PT End of Session - 09/09/23 1107     Visit Number 7    Number of Visits 24    Date for PT Re-Evaluation 11/12/23    PT Start Time 1106    PT Stop Time 1145    PT Time Calculation (min) 39 min    Equipment Utilized During Treatment Gait belt    Activity Tolerance Patient tolerated treatment well    Behavior During Therapy WFL for tasks assessed/performed              Past Medical History:  Diagnosis Date   Anemia    Cervical myelopathy (HCC)    Chronic venous insufficiency    [name] (chronic kidney disease), stage III (HCC)    Diabetes mellitus without complication (HCC)    GERD (gastroesophageal reflux disease)    Hypercholesteremia    Hypothyroidism    Kidney stones    about 50 in the past--last in March 4/24   Leg weakness, bilateral    due to Myasthenia Gravis   Myasthenia gravis (HCC)    Spinal stenosis    Vitamin B 12 deficiency    Vitamin B 12 deficiency    Wears hearing aid    bilateral   Past Surgical History:  Procedure Laterality Date   BACK SURGERY  09/2015   CARDIAC CATHETERIZATION     CATARACT EXTRACTION W/ INTRAOCULAR LENS IMPLANT     CATARACT EXTRACTION W/PHACO Right 12/21/2015   Procedure: CATARACT EXTRACTION PHACO AND INTRAOCULAR LENS PLACEMENT (IOC);  Surgeon: Annell Kidney, MD;  Location: [facility];  Service: Ophthalmology;  Laterality: Right;  DIABETIC - insulin  and oral meds   COLONOSCOPY     SHOULDER SURGERY     labrum repair   TONSILLECTOMY     Patient Active Problem List   Diagnosis Date Noted   High blood pressure 08/13/2023   Anemia 08/13/2023   Constipation 08/13/2023   Myasthenia gravis (HCC) 08/13/2023   Acute ischemic right MCA stroke (HCC) 07/19/2023    Lymphedema 12/27/2016   Leg pain 07/15/2016   Chronic venous insufficiency 07/15/2016   Swelling of limb 07/15/2016   Type 2 diabetes mellitus (HCC) 07/15/2016   Hyperlipidemia 07/15/2016    ONSET DATE:   REFERRING DIAG:  Diagnosis  I63.511 (ICD-10-CM) - Cerebral infarction due to unspecified occlusion or stenosis of right middle cerebral artery    THERAPY DIAG:  Muscle weakness (generalized)  Other lack of coordination  Difficulty in walking, not elsewhere classified  Unsteadiness on feet  Hemiplegia and hemiparesis following cerebral infarction affecting left non-dominant side (HCC)  Vision disturbance  Abnormality of gait and mobility  Rationale for Evaluation and Treatment: Rehabilitation  SUBJECTIVE:  SUBJECTIVE STATEMENT:  S.O. reports pt is begging to try and get up to walk at home, but she has not let him. S.O. reports pt tried to get into their Expedition and he was able to step-up onto the running board, but he couldn't safely get his legs into the car. S.O. reports they are now using their Jeep Cherokee because it has a little more leg room for patient.   Pt states lately his L LE feels weaker, but states he isn't sure if that is just his imagination.  Pt states the L side of his body is itching like crazy with some pain when brushing back L side of his head. Pt's states his L leg has started twitching on it's own where it pulls into hip/knee flexion while he is sitting.   Denis pain currnetly, but states greatest pain is in L wrist when going to sit because hand is stuck in RW splint handle. Denies falls at home.   Pt reports his goal is to learn to walk again if at all possible, even if it is only 10 feet, and he wants to focus on walking during every PT session.      From Initial Eval: Pt reports that he is doing well. Has not been moving much since discharging from [facility], as therapists instructed pt to perform transfers only at home due to high fall risk with ambulation. Continues to have significant weakenss in the LUE with difficulty extending fingers as well as numbness and inattention to the L side of body resulting in multiple cuts on Arm and leg with WC mobility in home.  Will be attaining custom power WC from [employer]. Has loaner currently.   Pt accompanied by: significant other [name]   PERTINENT HISTORY: [name]. is a 73 y.o. male with history of T2DM, HTN, [name], renal calculi, seropositive myasthenia gravis who was admitted to [facility] on 07/14/2023 with reports 3 to 4-day history of stuttering speech and fluctuating LUE weakness followed by fall.  He reported some double vision and per discussion with his neurology I had increased prednisone  to 10 mg/day due to concerns of MG flare.  He was found to have left facial weakness with dysarthria as well as LUE and LLE weakness.  MRI brain done revealing acute right MCA infarction involving right periventricular white matter and extending into right frontal and parietal lobes.    [name]. was admitted to rehab 07/19/2023 for inpatient therapies to consist of PT, ST and OT at least three hours five days a week. Past admission physiatrist, therapy team and rehab RN have worked together to provide customized collaborative inpatient rehab.  He continues on low-dose aspirin  and subcu Lovenox  was used for DVT prophylaxis during his stay.  His blood pressures were monitored on TID basis and has been stable on current regimen.  Serial check of CBC shows H&H to be relatively stable stool guaiac was negative x 1.  He continues to have lower extremity edema and was educated on importance of elevation for edema control.  Constipation has resolved with sorbitol  use on intermittent basis.  P.o. intake has been  good.  He was noted to have Candida crura's which was treated with course of Diflucan.  MASD has resolved with use of Gerhardt's Butt cream.   Left hemiplegia as been a limiting factor with left knee pain noted with increase in activity.  EKG was added for support and pain management.  His diabetes has been monitored  with ac/hs CBG checks and SSI was use prn for tighter BS control.  Metformin  was resumed at 5 mg twice daily and basal insulin  was slowly titrated upwards to 26 units.  Blood sugars continue to be variable and he was advised to increase metformin  to home dose and titrate insulin  glargine slowly if blood sugars range over 150.  He has made steady gains during his stay and continues to be limited by mild left inattention with left knee plegia and weakness. He has been evaluated for  speciality wheelchair by [employer]. He will continue to receive follow up outpatient PT, OT and ST at [facility] after discharge.   PAIN:  Are you having pain? No and reports Numbness in the L H and and Leg   PRECAUTIONS: Fall  **reports gets rash from contact with latex  RED FLAGS: Hx of MG   WEIGHT BEARING RESTRICTIONS: No  FALLS: Has patient fallen in last 6 months? Yes. Number of falls 1  LIVING ENVIRONMENT: Lives with: lives with their spouse Lives in: House/apartment Stairs: Ramps installed while in the Hospital  Has following equipment at home: [employer] - 2 wheeled and Wheelchair (power)  PLOF: Independent with basic ADLs, Independent with household mobility without device, and Independent with community mobility with device with [facility]   PATIENT GOALS: relearn to Walk 15-70ft.   OBJECTIVE:  Note: Objective measures were completed at Evaluation unless otherwise noted.  DIAGNOSTIC FINDINGS: MRI brain done revealing acute right MCA infarction involving right periventricular white matter and extending into right frontal and parietal lobes.  COGNITION: Overall cognitive status: Impaired  poor awareness of L side while dual tasking.    SENSATION: Light touch: Impaired   COORDINATION: Limited ROM and strength on the LUE limiting finger to nose.  ROM and strength deficits limit ankle to knee.   EDEMA:  Mild edema in the L UE.   MUSCLE TONE: LLE: Mild    POSTURE: flexed trunk. Lateral lean to the L   LOWER EXTREMITY ROM:     Active  Right Eval Left Eval  Hip flexion    Hip extension    Hip abduction    Hip adduction    Hip internal rotation    Hip external rotation    Knee flexion    Knee extension    Ankle dorsiflexion    Ankle plantarflexion    Ankle inversion    Ankle eversion     (Blank rows = not tested)  LOWER EXTREMITY MMT:    MMT Right Eval Left Eval  Hip flexion 4+ 4-  Hip extension 4+ 3+  Hip abduction 4+ 3+  Hip adduction 4+ 3+  Hip internal rotation    Hip external rotation    Knee flexion 5 4-  Knee extension 5 4-  Ankle dorsiflexion 5 4  Ankle plantarflexion    Ankle inversion    Ankle eversion    (Blank rows = not tested)  BED MOBILITY:  Findings: Sit to supine Mod A and Max A Supine to sit Mod A and Max A  TRANSFERS: Sit to stand: Min A and Mod A  Assistive device utilized: Environmental consultant - 2 wheeled     Stand to sit: Min A  Assistive device utilized: Environmental consultant - 2 wheeled     Chair to chair: Min A and Mod A  Assistive device utilized: Environmental consultant - 2 wheeled      PT required to stabilize the LUE on RW as well as provide max cues  for attention to hand to prevent sliding off hand grip.  At home Pt utilizes hand splint for stability on RW to prevent loss of grip in transfers.   RAMP:  Not tested  CURB:  Not tested  STAIRS: Not tested GAIT: Findings: Gait Characteristics: step to pattern, decreased hip/knee flexion- Left, Left foot flat, ataxic, lateral lean- Left, wide BOS, and poor foot clearance- Left, Distance walked: 58ft, Assistive device utilized:Walker - 2 wheeled, Level of assistance: Mod A and Max A, and Comments: poor  awareness of lateral/anterior L LOB as well as poor awareness of LUE causing loos of grip on RW and heavy lateral LOB to the L, +2 assist required to prevent fall and position chair under patient as he attempted turn at 24ft.    FUNCTIONAL TESTS:  5 times sit to stand: 1:29 min Timed up and go (TUG): unable to complete turn at 16ft.  10 meter walk test: unable to complete on this day.   PATIENT SURVEYS:  Stroke Impact Scale 49/80                                                                                                                              TREATMENT DATE: 09/09/2023   Pt wearing L swedish knee cage for therapy. Pt's wife brought in pt's personal L hand splint to use on bari-RW during session. Pt's wife requested to use clinic's resting hand splint moving forward because donning/doffing pt's hand splint from his RW is impacting how it fits on pt's RW at home.     Stand pivot from transport chair to clinic's wheelchair chair using bari-RW (with L hand support) and skilled min A for lifting to stand and balance while turning - cuing for L foot placement when turning due to impaired proprioception/awareness.    During session, sit>stands from clinic's w/c to bari-RW with skilled heavy min A for lifting to stand and balance due to posterior lean bias today (posterior lean is not as prominent as when last seen by this therapist) - therapist assists with L foot placement prior to initiating coming to stand. (This continues to be an excessively low seat height for pt despite placing airex pad in wheelchair seat for improved floor-to-seat height).  Focused during session on increased pt independence with managing L hand placement on/off RW hand splint, especially focused on unstrapping and removing hand from splint prior to sitting to avoid L wrist pain    10 Meter Walk Test: Patient instructed to walk 10 meters (32.8 ft) as safely as possible at their normal speed Results: 0.103 m/s  ( and 37 seconds) using bari-RW with skilled heavy min A and +2 w/c follow for safety - cuing for increased L step length and R lean  Cut off scores:   Household Ambulator  < 0.4 m/s  Limited Community Ambulator  0.4 - 0.8 m/s  [employer]  > 0.8 m/s  Increased fall risk  < 1.49m/s  Crossing a Street  >1.58m/s  MCID 0.05 m/s (small), 0.13 m/s (moderate), 0.06 m/s (significant)  (ANPTA Core Set of Outcome Measures for Adults with Neurologic Conditions, 2018)    Participated in Timed Up and Go (TUG): Only trial: and 32 seconds using bari-RW with L hand splint and +2 on R side for safety, therapist providing skilled min A on L side and 3rd person nearby for safety if needed; however, didn't need the 3rd person Had pt set-up L hand on RW splint, get L foot in proper positioning, and scoot forward in seat before starting the timer Requires min A to come to stand followed by min A for balance and AD management during gait  Patient demonstrates high fall risk as indicated by requiring significantly >13.5seconds to complete the TUG.   Gait training 139ft using bari-RW with skilled min assist for balance and AD management with +2 wheelchair follow for safety and to allow increased gait distance. Pt demonstrating the following gait deviations with therapist providing the described cuing and facilitation for improvement:  Tends to push the AD too far forward  Improved L LE foot placement during swing advancement today with decreased hip external rotation and hip adduction, until pt becomes fatigued towards end of the gait trials and reverts back to excessive L hip adduction/ER L knee doesn't have as quick nor as forceful movement into knee extension in the swedish knee cage during stance phase today due to improving timing of L glute/quad activation and wt shifting onto L LE during stance Pt continues to lack forward progression of pelvis over L stance phase, but improving and  thearpist attempting to facilitate increased glute activation today as pt now able to tolerate that with improved balance Pt with significant improvement in midline orientation with no strong L lean occurring today Pt states he is focusing on R wt shifting and now knows when he isn't able to pick-up L foot he knows he hasn't shifted his weight Continues to be able to achieve more consistent reciprocal stepping pattern (>80% of the time) rather than step-to pattern leading with L LE  Therapist continued to educate pt and S.O. on recommendation not to ambulate at home until continued gait training with skilled physical therapy for pt to reach a more consistent independent level.     PATIENT EDUCATION: Education details: Pt educated throughout session about proper posture and technique with exercises. Improved exercise technique, movement at target joints, use of target muscles after min to mod verbal, visual, tactile cues.  Person educated: Patient and Spouse Education method: Explanation, VC/TC/Demo Education comprehension: verbalized understanding  HOME EXERCISE PROGRAM:  To be provided at upcoming visits  GOALS: Goals reviewed with patient? Yes  SHORT TERM GOALS: Target date: 09/25/2023    Patient will be independent in home exercise program to improve strength/mobility for better functional independence with ADLs. Baseline: To be provided at upcoming visits Goal status: INITIAL   LONG TERM GOALS: Target date: 11/13/2023    Patient will increase SIS-16 score to equal to or greater than 10points    to demonstrate statistically significant improvement in mobility and quality of life.  Baseline: 49/80 Goal status: INITIAL  2.  Patient (> 54 years old) will complete five times sit to stand test in < 15 seconds indicating an increased LE strength and improved balance. Baseline: 1:34min Goal status: INITIAL  3.  Patient will increase FIST score by > 6 points to demonstrate  decreased fall risk during functional activities Baseline:  43/56  Goal status: INITIAL  4.  Patient will increase 10 meter walk test to >0.38m/s as to improve gait speed for better community ambulation and to reduce fall risk. Baseline: 09/09/2023: 0.103 m/s ( and 37 seconds) using bari-RW with skilled heavy min A and +2 w/c follow for safety  Goal status: INITIAL  5.  Patient will reduce timed up and go to <11 seconds to reduce fall risk and demonstrate improved transfer/gait ability. Baseline: unable to complete turn at 10 ft. Mod-max assist due to poor control of LW on RW, resulting in L lateral LOB  09/09/2023: and 32 seconds using bari-RW with L hand splint and +2 on R side for safety, therapist providing skilled min A on L side (Had pt set-up L hand on RW splint, get L foot in proper positioning, and scoot forward in seat before starting the timer) Goal status: INITIAL   ASSESSMENT:  CLINICAL IMPRESSION:  Patient is a 73 y.o. male who was seen today for physical therapy treatment for balance, coordination, and gait deficits following CVA in April. Pt has hx of MG and was initially treated for MG exacerbation, but was then found to have CVA affecting LUE>LLE as well as poor awareness of L side. Patient's wife again brought in pt's L RW hand splint for use during session allowing increased participation in standing and gait training because pt with significant L hemibody inattention/impaired proprioception impacting his awareness and coordination of that side. Therapy session focused on assessment of and TUG with pt demonstrating significant impairments and requiring skilled min A with +2-3 assist nearby or with wheelchair follow to ensure patient safety at this time. Therapy session then focused on continued progression of gait training using bari-RW with patient progressing to gait training to 126ft!!Using bari-RW with skilled min/mod A and +2 w/c follow for safety with pt  demonstrating improved R wt shift onto R stance and improved L LE foot placement/advancement during swing. Patient also able to progress to performing 90 degree and 180 degree turns during gait training! Pt will benefit from continued skilled PT to address strength, balance, coordination, attention deficits to improve function, increase independence with functional mobility, and overall QoL and return to PLOF.   OBJECTIVE IMPAIRMENTS: Abnormal gait, cardiopulmonary status limiting activity, decreased activity tolerance, decreased balance, decreased cognition, decreased coordination, decreased endurance, decreased knowledge of condition, decreased knowledge of use of DME, decreased mobility, difficulty walking, decreased ROM, decreased strength, decreased safety awareness, dizziness, hypomobility, increased fascial restrictions, impaired perceived functional ability, increased muscle spasms, impaired sensation, impaired tone, impaired UE functional use, impaired vision/preception, improper body mechanics, postural dysfunction, and obesity.   ACTIVITY LIMITATIONS: carrying, lifting, bending, sitting, standing, squatting, stairs, transfers, bed mobility, bathing, toileting, dressing, reach over head, hygiene/grooming, locomotion level, and caring for others  PARTICIPATION LIMITATIONS: meal prep, cleaning, laundry, medication management, interpersonal relationship, driving, shopping, community activity, and yard work  PERSONAL FACTORS: Age, Past/current experiences, Time since onset of injury/illness/exacerbation, and 3+ comorbidities: MG, HTN, CVA, lymphedema are also affecting patient's functional outcome.   REHAB POTENTIAL: Good  CLINICAL DECISION MAKING: Unstable/unpredictable  EVALUATION COMPLEXITY: High  PLAN:  PT FREQUENCY: 1-2x/week  PT DURATION: 12 weeks  PLANNED INTERVENTIONS: 97164- PT Re-evaluation, 97750- Physical Performance Testing, 97110-Therapeutic exercises, 97530- Therapeutic  activity, [id number]- Neuromuscular re-education, 97535- Self Care, 78469- Manual therapy, [id number]- Gait training, [id number]- Orthotic Initial, [id number]- Orthotic/Prosthetic subsequent, [phone]- Canalith repositioning, [id number]- Electrical stimulation (unattended), [id number]- Electrical stimulation (manual), [id number]- Wound care (first 20 sq cm), 44010- Wound  care (each additional 20 sq cm), Patient/Family education, Balance training, Stair training, Taping, Dry Needling, Joint mobilization, Joint manipulation, Vestibular training, Visual/preceptual remediation/compensation, Cognitive remediation, DME instructions, Wheelchair mobility training, Cryotherapy, and Moist heat  PLAN FOR NEXT SESSION:  Initiate HEP Gait training with L hand splint on bari-RW  Continue progression of turning through doorways and turning to sit in chair at end of gait Continue reinforcing pt management of L hand placement on/off hand splint during transfers Work on midline orientation and R weight shifting Work on L LE coordination and strength for improved swing phase advancement    Elese Rane, PT, DPT, NCS, CSRS Physical Therapist - [place]  [facility]  11:48 AM 09/09/23

## 2023-09-10 ENCOUNTER — Telehealth: Payer: Self-pay

## 2023-09-10 NOTE — Telephone Encounter (Signed)
 Pt. Stated that he called the pharmacy and their is no prescription for him to pick up. Please advise.

## 2023-09-10 NOTE — Therapy (Signed)
 OUTPATIENT OCCUPATIONAL THERAPY NEURO TREATMENT  Patient Name: [name]. MRN: [id number] DOB:[birth year], 73 y.o., male Today's Date: 09/10/2023  PCP: Arvel Birch, MD REFERRING PROVIDER: Leotha Rang, PA-C  END OF SESSION:  OT End of Session - 09/10/23 1204     Visit Number 7    Number of Visits 24    Date for OT Re-Evaluation 11/12/23    OT Start Time 1145    OT Stop Time 1230    OT Time Calculation (min) 45 min    Activity Tolerance Patient tolerated treatment well    Behavior During Therapy WFL for tasks assessed/performed         Past Medical History:  Diagnosis Date   Anemia    Cervical myelopathy (HCC)    Chronic venous insufficiency    [name] (chronic kidney disease), stage III (HCC)    Diabetes mellitus without complication (HCC)    GERD (gastroesophageal reflux disease)    Hypercholesteremia    Hypothyroidism    Kidney stones    about 50 in the past--last in March 4/24   Leg weakness, bilateral    due to Myasthenia Gravis   Myasthenia gravis (HCC)    Spinal stenosis    Vitamin B 12 deficiency    Vitamin B 12 deficiency    Wears hearing aid    bilateral   Past Surgical History:  Procedure Laterality Date   BACK SURGERY  09/2015   CARDIAC CATHETERIZATION     CATARACT EXTRACTION W/ INTRAOCULAR LENS IMPLANT     CATARACT EXTRACTION W/PHACO Right 12/21/2015   Procedure: CATARACT EXTRACTION PHACO AND INTRAOCULAR LENS PLACEMENT (IOC);  Surgeon: Annell Kidney, MD;  Location: [facility];  Service: Ophthalmology;  Laterality: Right;  DIABETIC - insulin  and oral meds   COLONOSCOPY     SHOULDER SURGERY     labrum repair   TONSILLECTOMY     Patient Active Problem List   Diagnosis Date Noted   High blood pressure 08/13/2023   Anemia 08/13/2023   Constipation 08/13/2023   Myasthenia gravis (HCC) 08/13/2023   Acute ischemic right MCA stroke (HCC) 07/19/2023   Lymphedema 12/27/2016   Leg pain 07/15/2016   Chronic venous  insufficiency 07/15/2016   Swelling of limb 07/15/2016   Type 2 diabetes mellitus (HCC) 07/15/2016   Hyperlipidemia 07/15/2016   ONSET DATE: 07/13/2023  REFERRING DIAG: Acute ischemic R MCA CVA  THERAPY DIAG: muscle weakness (generalized), other lack of coordination, vision disturbance, hemiplegia and hemiparesis following cerebral infarction affecting left non-dominant side (HCC)  Rationale for Evaluation and Treatment: Rehabilitation  SUBJECTIVE:  SUBJECTIVE STATEMENT: Pt/spouse report pt has been having more success opening his fingers.   Pt accompanied by: significant other  PERTINENT HISTORY: Pt. Was admitted to [facility] on 07/13/2023 after sustaining a CVA while on a trip to the beach. Pt. Was diagnosed with R MCA CVA. Pt. Was admitted to inpatient rehab from 07/19/2023-08/13/2023. Past medical history includes high BP, anemia, and Myasthenia Gravis.   PRECAUTIONS: None  WEIGHT BEARING RESTRICTIONS: No  PAIN:  Are you having pain? 1-2/10 at the dorsal forearm in the left wrist flexors, L hand digit flexors  FALLS: Has patient fallen in last 6 months? Yes  LIVING ENVIRONMENT: Lives with: lives with their family and lives with their spouse-  Lives in: House/apartment- 2 story home and resides mostly on the 1st floor Stairs: No- Ramped entrance Has following equipment at home:  Otho Blitz, cane, Steadi, shower chair, handheld shower head, bedside commode  PLOF:  Independent and Independent with basic ADLs Independent at home   PATIENT GOALS: Would like to walk 10-20 feet each time.   OBJECTIVE:  Note: Objective measures were completed at Evaluation unless otherwise noted.  HAND DOMINANCE: Right  ADLs: Overall ADLs:  Transfers/ambulation related to ADLs: Eating: Independent, 100% R handed Grooming: independent UB Dressing: Can put on shirt independently LB Dressing: Difficulty with pants and requires assistance getting the pants on his feet, has difficulty putting on shoes  and socks Toileting: Tot A for toilet hygiene. Uses Steadi during toilet transfers. Bathing: Requires assist with using the L arm to wash the R arm. Tub Shower transfers: Roll in shower, built in shower chair, grab bars and hand held shower head.  IADLs: Shopping: Total A (Wife typically did the shopping prior to onset)   Light housekeeping: Total A (wife typically performs prior to onset) Meal Prep: Independent with light snack prep Community mobility: No driving, able to get in/out of the car Medication management: Set up assistance from his wife using a pill box (pt. Is responsible for taking medications at the correct time) Financial management: family members check behind him. Handwriting: TBD Hobbies: gardening, wood working and sports- [employer] Work history: Owns a tax business MOBILITY STATUS: Needs Assist:    POSTURE COMMENTS:   Sitting balance: Good supported sitting balance  FUNCTIONAL OUTCOME MEASURES: TBD   UPPER EXTREMITY ROM:    Active ROM Right Eval  WFL Left eval  Shoulder flexion  89(110)  Shoulder abduction  98(120)  Shoulder adduction    Shoulder extension    Shoulder internal rotation    Shoulder external rotation    Elbow flexion  120(140)  Elbow extension  -28(-10)  Wrist flexion  60(60) Rest at 60  Wrist extension  -48(42)  Wrist ulnar deviation    Wrist radial deviation    Wrist pronation    Wrist supination    (Blank rows = not tested)  L Digit flexion: 2nd:3cm (0cm) 3rd: 0cm (0cm), 4th: 0cm (0cm), 5th: 2cm (0cm)  L Digit extension: Active Gross digit extension through 10% of ROM UPPER EXTREMITY MMT:     MMT Right eval Left eval  Shoulder flexion 5 3-  Shoulder abduction 5 3-  Shoulder adduction    Shoulder extension    Shoulder internal rotation    Shoulder external rotation    Middle trapezius    Lower trapezius    Elbow flexion 5 TBD  Elbow extension 5 TBD  Wrist flexion    Wrist extension  2-  Wrist ulnar deviation     Wrist radial deviation    Wrist pronation    Wrist supination    (Blank rows = not tested)  HAND FUNCTION:   COORDINATION:    SENSATION: Sensation feels different in both hands.  L hand Light touch- impaired L hand Proprioception- impaired  EDEMA: Has had hx of edema, and came to Eval session with swelling in the L hand/wrist/forearm.   *pt. Has a laceration from his dog on the dorsal aspect of the L hand.   MUSCLE TONE: Increased tone through the UE and hand flexors.   COGNITION: Overall cognitive status: Within functional limits for tasks assessed  VISION: Subjective report: L sided inattention Baseline vision: Wears glasses for reading only Visual history: Right visual occlusion  VISION ASSESSMENT: To be assessed  PERCEPTION: TBD  PRAXIS: Impaired  TREATMENT DATE: 09/09/2023 Therapeutic Exercise: -Review of self PROM for distal LUE, including L elbow ext, forearm sup, and wrist/digit flexion extension; able to return demo with mod vc/tactile cues for technique.  PROM completed to reduce muscle stiffness and prepare LUE for therapeutic activities noted below -OT completed passive L shoulder depression, retraction, forward flexion and abd to tolerance, ER with elbow at side, progressing to arm abd to 45* and 90*, forearm sup, and wrist and digit ext; OT assist to maximize end range and improve form.    Therapeutic Activity: -Judy Board pegs/pegboard on an incline: Pt worked to reach for pegs to promote forward reaching, grasping, storing, and releasing peg toward floor level to promote elbow, wrist, and digit ext; occasional proximal support at the elbow with forward reaching.  Intermittent min verbal and visual cues to reduce substitution patterns by limiting truncal leaning and rather cued to extend elbow when reaching for peg at table top level.   -Max A to  remove peg from pegboard (peg too short for pt's current level of dexterity), occasional min A to grasp a peg placed vertically or horizontally from OT 's hand. -Facilitated forward reach and ER, working to grasp peg from OT 's hand at table top level, and discard from hand reaching into ER.     PATIENT EDUCATION: Education details:  LUE reaching/grasp/release activities Person educated: Patient and Spouse Education method: Explanation, Demonstration, Tactile cues, and Verbal cues Education comprehension: verbalized understanding, returned demonstration, and needs further education  HOME EXERCISE PROGRAM: Self passive stretching throughout the LUE; grasp/release activities   GOALS: Goals reviewed with patient? Yes  SHORT TERM GOALS: Target date: 10/01/2023   Pt. Will be independent with HEP for the LUE.  Baseline: Eval; no current HEP Goal status: INITIAL   LONG TERM GOALS: Target date: 11/12/2023  Pt. Will increase L shoulder flexion by 10 degrees to be able to reach up to shelves.  Baseline: Eval; L shoulder flexion is 89 (110). Goal status: INITIAL  2.  Pt. Will increase L shoulder abduction by 10 degrees to assist with underarm hygiene.  Baseline: Eval: L shoulder abduction is 98 (120).  Goal status: INITIAL  3.  Pt. Will improve L wrist extension by 10 degrees to be able to initiate anticipation of grasping for objects.  Baseline: Eval: -48 (42) Goal status: INITIAL  4.  Pt. Will improve active gross digit extension through 25% of the range to be able to release objects in his hand consistently.  Baseline: Eval: gross digit extension is 10% of the range  Goal status: INITIAL  5.  Pt. Will independently demonstrate visual compensatory strategies when navigating through environment and during tabletop tasks. Baseline: Pt. Requires consistent cuing for L sided awareness.  Goal status: INITIAL  ASSESSMENT:  CLINICAL IMPRESSION: Pt with good tolerance to PROM throughout  the LUE, though mild discomfort at end range wrist and digit ext.  Pt requires reinforcement for self passive stretching technique, specifically wrist and digit ext and forearm supination d/t tight flexors/pronators.  Pt was educated on functional benefits of reducing stiffness in L hand, wrist, and forearm in relation to ADLs and transfers.  Pt verbalized understanding but will continue to reinforce to ensure carryover of daily stretching in the home.  While reaching for pegs at table top, pt was able to self correct to reduce substitution patterns following initial OT cueing to eliminate truncal leaning and rather extend L elbow.  Continued focus on L shoulder passive and active ER for  improving reach behind head for UB ADLs.  When attempting to drop a peg behind his back from top of L shoulder, pt would drop peg superior to clavicle with peg rolling down his front, requiring min A to achieve necessary range to drop peg behind his back.  Pt demonstrated improving grasp/release skills; though pt was not yet able to independently grasp a short Judy Board peg from pegboard, he was able to successfully grab same peg vertically or horizontally from OT' s hand with occasional min A to prevent dropping.  Pt continues to benefit from OT services to work on improving LUE functioning to increase engagement of the LUE during daily task, and to provide education about compensatory strategies during ADLs/IADLs.  PERFORMANCE DEFICITS: in functional skills including ADLs, IADLs, coordination, dexterity, proprioception, sensation, edema, tone, ROM, strength, pain, Fine motor control, Gross motor control, mobility, balance, endurance, vision, and UE functional use, cognitive skills including perception, and psychosocial skills including coping strategies, environmental adaptation, and routines and behaviors.   IMPAIRMENTS: are limiting patient from ADLs, IADLs, and leisure.   CO-MORBIDITIES: may have co-morbidities  that  affects occupational performance. Patient will benefit from skilled OT to address above impairments and improve overall function.  MODIFICATION OR ASSISTANCE TO COMPLETE EVALUATION: Min-Moderate modification of tasks or assist with assess necessary to complete an evaluation.  OT OCCUPATIONAL PROFILE AND HISTORY: Detailed assessment: Review of records and additional review of physical, cognitive, psychosocial history related to current functional performance.  CLINICAL DECISION MAKING: Moderate - several treatment options, min-mod task modification necessary  REHAB POTENTIAL: Good  EVALUATION COMPLEXITY: Moderate    PLAN:  OT FREQUENCY: 2x/week  OT DURATION: 12 weeks  PLANNED INTERVENTIONS: 97168 OT Re-evaluation, 97535 self care/ADL training, 91478 therapeutic exercise, 97530 therapeutic activity, 97112 neuromuscular re-education, 97140 manual therapy, 97018 paraffin, 29562 moist heat, 97010 cryotherapy, 97034 contrast bath, 97760 Orthotic Initial, 97763 Orthotic/Prosthetic subsequent, passive range of motion, visual/perceptual remediation/compensation, energy conservation, and DME and/or AE instructions  RECOMMENDED OTHER SERVICES: PT and ST  CONSULTED AND AGREED WITH PLAN OF CARE: Patient and family member/caregiver  PLAN FOR NEXT SESSION: see above  Marcus Sewer, MS, OTR/L

## 2023-09-10 NOTE — Telephone Encounter (Signed)
 Call placed to [name].  Atorvastatin  prescription was sent on 09/02/18-25.  She states she called the pharmacy and they have the prescription. She was instructed again to call to schedule an appointment with his PCP, she verbalizes understanding.

## 2023-09-11 ENCOUNTER — Ambulatory Visit: Admitting: Occupational Therapy

## 2023-09-11 ENCOUNTER — Ambulatory Visit: Admitting: Physical Therapy

## 2023-09-11 DIAGNOSIS — R278 Other lack of coordination: Secondary | ICD-10-CM

## 2023-09-11 DIAGNOSIS — M6281 Muscle weakness (generalized): Secondary | ICD-10-CM | POA: Diagnosis not present

## 2023-09-11 DIAGNOSIS — R262 Difficulty in walking, not elsewhere classified: Secondary | ICD-10-CM

## 2023-09-11 DIAGNOSIS — I69354 Hemiplegia and hemiparesis following cerebral infarction affecting left non-dominant side: Secondary | ICD-10-CM

## 2023-09-11 DIAGNOSIS — R2681 Unsteadiness on feet: Secondary | ICD-10-CM

## 2023-09-11 DIAGNOSIS — R269 Unspecified abnormalities of gait and mobility: Secondary | ICD-10-CM

## 2023-09-11 DIAGNOSIS — H539 Unspecified visual disturbance: Secondary | ICD-10-CM

## 2023-09-11 NOTE — Therapy (Signed)
 OUTPATIENT OCCUPATIONAL THERAPY NEURO TREATMENT  Patient Name: [name]. MRN: [id number] DOB:1951/01/07, 73 y.o., male Today's Date: 09/11/2023  PCP: Arvel Birch, MD REFERRING PROVIDER: Leotha Rang, PA-C  END OF SESSION:  OT End of Session - 09/11/23 1223     Visit Number 8    Date for OT Re-Evaluation 11/12/23    OT Start Time 1015    OT Stop Time 1100    OT Time Calculation (min) 45 min    Behavior During Therapy [facility] for tasks assessed/performed         Past Medical History:  Diagnosis Date   Anemia    Cervical myelopathy (HCC)    Chronic venous insufficiency    [name] (chronic kidney disease), stage III (HCC)    Diabetes mellitus without complication (HCC)    GERD (gastroesophageal reflux disease)    Hypercholesteremia    Hypothyroidism    Kidney stones    about 50 in the past--last in March 4/24   Leg weakness, bilateral    due to Myasthenia Gravis   Myasthenia gravis (HCC)    Spinal stenosis    Vitamin B 12 deficiency    Vitamin B 12 deficiency    Wears hearing aid    bilateral   Past Surgical History:  Procedure Laterality Date   BACK SURGERY  09/2015   CARDIAC CATHETERIZATION     CATARACT EXTRACTION W/ INTRAOCULAR LENS IMPLANT     CATARACT EXTRACTION W/PHACO Right 12/21/2015   Procedure: CATARACT EXTRACTION PHACO AND INTRAOCULAR LENS PLACEMENT (IOC);  Surgeon: Annell Kidney, MD;  Location: [facility];  Service: Ophthalmology;  Laterality: Right;  DIABETIC - insulin  and oral meds   COLONOSCOPY     SHOULDER SURGERY     labrum repair   TONSILLECTOMY     Patient Active Problem List   Diagnosis Date Noted   High blood pressure 08/13/2023   Anemia 08/13/2023   Constipation 08/13/2023   Myasthenia gravis (HCC) 08/13/2023   Acute ischemic right MCA stroke (HCC) 07/19/2023   Lymphedema 12/27/2016   Leg pain 07/15/2016   Chronic venous insufficiency 07/15/2016   Swelling of limb 07/15/2016   Type 2 diabetes mellitus  (HCC) 07/15/2016   Hyperlipidemia 07/15/2016   ONSET DATE: 07/13/2023  REFERRING DIAG: Acute ischemic R MCA CVA  THERAPY DIAG: muscle weakness (generalized), other lack of coordination, vision disturbance, hemiplegia and hemiparesis following cerebral infarction affecting left non-dominant side (HCC)  Rationale for Evaluation and Treatment: Rehabilitation  SUBJECTIVE:  SUBJECTIVE STATEMENT: Pt/spouse report pt has been having more success opening his fingers.   Pt accompanied by: significant other  PERTINENT HISTORY: Pt. Was admitted to [facility] on 07/13/2023 after sustaining a CVA while on a trip to the beach. Pt. Was diagnosed with R MCA CVA. Pt. Was admitted to inpatient rehab from 07/19/2023-08/13/2023. Past medical history includes high BP, anemia, and Myasthenia Gravis.   PRECAUTIONS: None  WEIGHT BEARING RESTRICTIONS: No  PAIN:  Are you having pain? 3/10 at the dorsal forearm in the left wrist flexors, L hand digit flexors  FALLS: Has patient fallen in last 6 months? Yes  LIVING ENVIRONMENT: Lives with: lives with their family and lives with their spouse-  Lives in: House/apartment- 2 story home and resides mostly on the 1st floor Stairs: No- Ramped entrance Has following equipment at home:  Otho Blitz, cane, Steadi, shower chair, handheld shower head, bedside commode  PLOF: Independent and Independent with basic ADLs Independent at home   PATIENT GOALS: Would like to  walk 10-20 feet each time.   OBJECTIVE:  Note: Objective measures were completed at Evaluation unless otherwise noted.  HAND DOMINANCE: Right  ADLs: Overall ADLs:  Transfers/ambulation related to ADLs: Eating: Independent, 100% R handed Grooming: independent UB Dressing: Can put on shirt independently LB Dressing: Difficulty with pants and requires assistance getting the pants on his feet, has difficulty putting on shoes and socks Toileting: Tot A for toilet hygiene. Uses Steadi during toilet  transfers. Bathing: Requires assist with using the L arm to wash the R arm. Tub Shower transfers: Roll in shower, built in shower chair, grab bars and hand held shower head.  IADLs: Shopping: Total A (Wife typically did the shopping prior to onset)   Light housekeeping: Total A (wife typically performs prior to onset) Meal Prep: Independent with light snack prep Community mobility: No driving, able to get in/out of the car Medication management: Set up assistance from his wife using a pill box (pt. Is responsible for taking medications at the correct time) Financial management: family members check behind him. Handwriting: TBD Hobbies: gardening, wood working and sports- [employer] Work history: Owns a tax business MOBILITY STATUS: Needs Assist:    POSTURE COMMENTS:   Sitting balance: Good supported sitting balance  FUNCTIONAL OUTCOME MEASURES: TBD   UPPER EXTREMITY ROM:    Active ROM Right Eval  WFL Left eval  Shoulder flexion  89(110)  Shoulder abduction  98(120)  Shoulder adduction    Shoulder extension    Shoulder internal rotation    Shoulder external rotation    Elbow flexion  120(140)  Elbow extension  -28(-10)  Wrist flexion  60(60) Rest at 60  Wrist extension  -48(42)  Wrist ulnar deviation    Wrist radial deviation    Wrist pronation    Wrist supination    (Blank rows = not tested)  L Digit flexion: 2nd:3cm (0cm) 3rd: 0cm (0cm), 4th: 0cm (0cm), 5th: 2cm (0cm)  L Digit extension: Active Gross digit extension through 10% of ROM UPPER EXTREMITY MMT:     MMT Right eval Left eval  Shoulder flexion 5 3-  Shoulder abduction 5 3-  Shoulder adduction    Shoulder extension    Shoulder internal rotation    Shoulder external rotation    Middle trapezius    Lower trapezius    Elbow flexion 5 TBD  Elbow extension 5 TBD  Wrist flexion    Wrist extension  2-  Wrist ulnar deviation    Wrist radial deviation    Wrist pronation    Wrist supination    (Blank  rows = not tested)  HAND FUNCTION:   COORDINATION:    SENSATION: Sensation feels different in both hands.  L hand Light touch- impaired L hand Proprioception- impaired  EDEMA: Has had hx of edema, and came to Eval session with swelling in the L hand/wrist/forearm.   *pt. Has a laceration from his dog on the dorsal aspect of the L hand.   MUSCLE TONE: Increased tone through the UE and hand flexors.   COGNITION: Overall cognitive status: Within functional limits for tasks assessed  VISION: Subjective report: L sided inattention Baseline vision: Wears glasses for reading only Visual history: Right visual occlusion  VISION ASSESSMENT: To be assessed  PERCEPTION: TBD  PRAXIS: Impaired  TREATMENT DATE: 09/10/2023  Manual Therapy:    -Pt. tolerated retrograde massage to the Left hand for edema control 2/2 increased edema.  -Manual therapy was performed independent of, and in preparation for therapeutic Ex.     Therapeutic Exercise:   -AAROM/AROM stretching performed throughout the LUE, including L shoulder all planes within pain free ranges, elbow flex/ext, forearm pron/sup, wrist flex/ext, and digit flex/ext.  -Focus on increasing range for L shoulder ER, forearm sup, and wrist ext, with review of self passive stretching techniques; pt able to return demo with min vc for technique with cues   Therapeutic Activities:  -facilitated grasping 1.5 pegs with emphasis on facilitating thumb abduction when releasing the items, sustaining the grasp during wrist extension, then releasing them onto a table.  PATIENT EDUCATION: Education details:  LUE reaching/grasp/release activities Person educated: Patient and Spouse Education method: Explanation, Demonstration, Tactile cues, and Verbal cues Education comprehension: verbalized understanding, returned demonstration, and  needs further education  HOME EXERCISE PROGRAM: Self passive stretching throughout the LUE; grasp/release activities   GOALS: Goals reviewed with patient? Yes  SHORT TERM GOALS: Target date: 10/01/2023   Pt. Will be independent with HEP for the LUE.  Baseline: Eval; no current HEP Goal status: INITIAL   LONG TERM GOALS: Target date: 11/12/2023  Pt. Will increase L shoulder flexion by 10 degrees to be able to reach up to shelves.  Baseline: Eval; L shoulder flexion is 89 (110). Goal status: INITIAL  2.  Pt. Will increase L shoulder abduction by 10 degrees to assist with underarm hygiene.  Baseline: Eval: L shoulder abduction is 98 (120).  Goal status: INITIAL  3.  Pt. Will improve L wrist extension by 10 degrees to be able to initiate anticipation of grasping for objects.  Baseline: Eval: -48 (42) Goal status: INITIAL  4.  Pt. Will improve active gross digit extension through 25% of the range to be able to release objects in his hand consistently.  Baseline: Eval: gross digit extension is 10% of the range  Goal status: INITIAL  5.  Pt. Will independently demonstrate visual compensatory strategies when navigating through environment and during tabletop tasks. Baseline: Pt. Requires consistent cuing for L sided awareness.  Goal status: INITIAL  ASSESSMENT:  CLINICAL IMPRESSION:  Pt. reports having 3/10 volar forearm pain at night, and during transitions from sit to stand. Pt. Plans to bring his splint in for a fit check. Pt. presents with consistently improving thumb abduction with increased range performed during grasp/release tasks. Pt continues to benefit from OT services to work on improving LUE functioning to increase engagement of the LUE during daily task, and to provide education about compensatory strategies during ADLs/IADLs.  PERFORMANCE DEFICITS: in functional skills including ADLs, IADLs, coordination, dexterity, proprioception, sensation, edema, tone, ROM,  strength, pain, Fine motor control, Gross motor control, mobility, balance, endurance, vision, and UE functional use, cognitive skills including perception, and psychosocial skills including coping strategies, environmental adaptation, and routines and behaviors.   IMPAIRMENTS: are limiting patient from ADLs, IADLs, and leisure.   CO-MORBIDITIES: may have co-morbidities  that affects occupational performance. Patient will benefit from skilled OT to address above impairments and improve overall function.  MODIFICATION OR ASSISTANCE TO COMPLETE EVALUATION: Min-Moderate modification of tasks or assist with assess necessary to complete an evaluation.  OT OCCUPATIONAL PROFILE AND HISTORY: Detailed assessment: Review of records and additional review of physical, cognitive, psychosocial history related to current functional performance.  CLINICAL DECISION MAKING: Moderate - several treatment options, min-mod task  modification necessary  REHAB POTENTIAL: Good  EVALUATION COMPLEXITY: Moderate    PLAN:  OT FREQUENCY: 2x/week  OT DURATION: 12 weeks  PLANNED INTERVENTIONS: 97168 OT Re-evaluation, 97535 self care/ADL training, 29562 therapeutic exercise, 97530 therapeutic activity, 97112 neuromuscular re-education, 97140 manual therapy, 97018 paraffin, 13086 moist heat, 97010 cryotherapy, 97034 contrast bath, 97760 Orthotic Initial, 97763 Orthotic/Prosthetic subsequent, passive range of motion, visual/perceptual remediation/compensation, energy conservation, and DME and/or AE instructions  RECOMMENDED OTHER SERVICES: PT and ST  CONSULTED AND AGREED WITH PLAN OF CARE: Patient and family member/caregiver  PLAN FOR NEXT SESSION: see above  Marcus Sewer, MS, OTR/L

## 2023-09-11 NOTE — Therapy (Signed)
 OUTPATIENT PHYSICAL THERAPY NEURO TREATMENT   Patient Name: [name]. MRN: [id number] DOB:1950/07/16, 73 y.o., male Today's Date: 09/11/2023   PCP: Douglas Laos, MD  REFERRING PROVIDER:    Sterling Eisenmenger, PA-C    END OF SESSION:   PT End of Session - 09/11/23 1008     Visit Number 8    Number of Visits 24    Date for PT Re-Evaluation 11/12/23    PT Start Time 1100    PT Stop Time 1140    PT Time Calculation (min) 40 min    Equipment Utilized During Treatment Gait belt    Activity Tolerance Patient tolerated treatment well    Behavior During Therapy WFL for tasks assessed/performed              Past Medical History:  Diagnosis Date   Anemia    Cervical myelopathy (HCC)    Chronic venous insufficiency    [name] (chronic Wright disease), stage III (HCC)    Diabetes mellitus without complication (HCC)    GERD (gastroesophageal reflux disease)    Hypercholesteremia    Hypothyroidism    Wright stones    about 50 in the past--last in March 4/24   Leg weakness, bilateral    due to Myasthenia Gravis   Myasthenia gravis (HCC)    Spinal stenosis    Vitamin B 12 deficiency    Vitamin B 12 deficiency    Wears hearing aid    bilateral   Past Surgical History:  Procedure Laterality Date   BACK SURGERY  09/2015   CARDIAC CATHETERIZATION     CATARACT EXTRACTION W/ INTRAOCULAR LENS IMPLANT     CATARACT EXTRACTION W/PHACO Right 12/21/2015   Procedure: CATARACT EXTRACTION PHACO AND INTRAOCULAR LENS PLACEMENT (IOC);  Surgeon: Douglas Kidney, MD;  Location: [facility];  Service: Ophthalmology;  Laterality: Right;  DIABETIC - insulin  and oral meds   COLONOSCOPY     SHOULDER SURGERY     labrum repair   TONSILLECTOMY     Patient Active Problem List   Diagnosis Date Noted   High blood pressure 08/13/2023   Anemia 08/13/2023   Constipation 08/13/2023   Myasthenia gravis (HCC) 08/13/2023   Acute ischemic right MCA stroke (HCC) 07/19/2023    Lymphedema 12/27/2016   Leg pain 07/15/2016   Chronic venous insufficiency 07/15/2016   Swelling of limb 07/15/2016   Type 2 diabetes mellitus (HCC) 07/15/2016   Hyperlipidemia 07/15/2016    ONSET DATE:   REFERRING DIAG:  Diagnosis  I63.511 (ICD-10-CM) - Cerebral infarction due to unspecified occlusion or stenosis of right middle cerebral artery    THERAPY DIAG:  Muscle weakness (generalized)  Other lack of coordination  Hemiplegia and hemiparesis following cerebral infarction affecting left non-dominant side (HCC)  Difficulty in walking, not elsewhere classified  Unsteadiness on feet  Vision disturbance  Abnormality of gait and mobility  Rationale for Evaluation and Treatment: Rehabilitation  SUBJECTIVE:  SUBJECTIVE STATEMENT:  Pt reports that he is doing well. No significant pain on this day. Hoping clinic has obtained RW hand splint because SO reports that removing splint for all sessions is affections position on personal RW.    Pt reports his goal is to learn to walk again if at all possible, even if it is only 10 feet, and he wants to focus on walking during every PT session.     From Initial Eval: Pt reports that he is doing well. Has not been moving much since discharging from [facility], as therapists instructed pt to perform transfers only at home due to high fall risk with ambulation. Continues to have significant weakenss in the LUE with difficulty extending fingers as well as numbness and inattention to the L side of body resulting in multiple cuts on Arm and leg with WC mobility in home.  Will be attaining custom power WC from [employer]. Has loaner currently.   Pt accompanied by: significant other [name]   PERTINENT HISTORY: [name]. is a 73 y.o. male with history of  T2DM, HTN, [name], renal calculi, seropositive myasthenia gravis who was admitted to [facility] on 07/14/2023 with reports 3 to 4-day history of stuttering speech and fluctuating LUE weakness followed by fall.  He reported some double vision and per discussion with his neurology I had increased prednisone  to 10 mg/day due to concerns of MG flare.  He was found to have left facial weakness with dysarthria as well as LUE and LLE weakness.  MRI brain done revealing acute right MCA infarction involving right periventricular white matter and extending into right frontal and parietal lobes.    [name]. was admitted to rehab 07/19/2023 for inpatient therapies to consist of PT, ST and OT at least three hours five days a week. Past admission physiatrist, therapy team and rehab RN have worked together to provide customized collaborative inpatient rehab.  He continues on low-dose aspirin  and subcu Lovenox  was used for DVT prophylaxis during his stay.  His blood pressures were monitored on TID basis and has been stable on current regimen.  Serial check of CBC shows H&H to be relatively stable stool guaiac was negative x 1.  He continues to have lower extremity edema and was educated on importance of elevation for edema control.  Constipation has resolved with sorbitol  use on intermittent basis.  P.o. intake has been good.  He was noted to have Candida crura's which was treated with course of Diflucan.  MASD has resolved with use of Gerhardt's Butt cream.   Left hemiplegia as been a limiting factor with left knee pain noted with increase in activity.  EKG was added for support and pain management.  His diabetes has been monitored with ac/hs CBG checks and SSI was use prn for tighter BS control.  Metformin  was resumed at 5 mg twice daily and basal insulin  was slowly titrated upwards to 26 units.  Blood sugars continue to be variable and he was advised to increase metformin  to home dose and titrate insulin  glargine slowly if  blood sugars range over 150.  He has made steady gains during his stay and continues to be limited by mild left inattention with left knee plegia and weakness. He has been evaluated for  speciality wheelchair by [name]. He will continue to receive follow up outpatient PT, OT and ST at [facility] after discharge.   PAIN:  Are you having pain? No and reports Numbness in the  L H and and Leg   PRECAUTIONS: Fall  **reports gets rash from contact with latex  RED FLAGS: Hx of MG   WEIGHT BEARING RESTRICTIONS: No  FALLS: Has patient fallen in last 6 months? Yes. Number of falls 1  LIVING ENVIRONMENT: Lives with: lives with their spouse Lives in: House/apartment Stairs: Ramps installed while in the Hospital  Has following equipment at home: Otho Blitz - 2 wheeled and Wheelchair (power)  PLOF: Independent with basic ADLs, Independent with household mobility without device, and Independent with community mobility with device with [facility]   PATIENT GOALS: relearn to Walk 15-25ft.   OBJECTIVE:  Note: Objective measures were completed at Evaluation unless otherwise noted.  DIAGNOSTIC FINDINGS: MRI brain done revealing acute right MCA infarction involving right periventricular white matter and extending into right frontal and parietal lobes.  COGNITION: Overall cognitive status: Impaired poor awareness of L side while dual tasking.    SENSATION: Light touch: Impaired   COORDINATION: Limited ROM and strength on the LUE limiting finger to nose.  ROM and strength deficits limit ankle to knee.   EDEMA:  Mild edema in the L UE.   MUSCLE TONE: LLE: Mild    POSTURE: flexed trunk. Lateral lean to the L   LOWER EXTREMITY ROM:     Active  Right Eval Left Eval  Hip flexion    Hip extension    Hip abduction    Hip adduction    Hip internal rotation    Hip external rotation    Knee flexion    Knee extension    Ankle dorsiflexion    Ankle plantarflexion    Ankle inversion     Ankle eversion     (Blank rows = not tested)  LOWER EXTREMITY MMT:    MMT Right Eval Left Eval  Hip flexion 4+ 4-  Hip extension 4+ 3+  Hip abduction 4+ 3+  Hip adduction 4+ 3+  Hip internal rotation    Hip external rotation    Knee flexion 5 4-  Knee extension 5 4-  Ankle dorsiflexion 5 4  Ankle plantarflexion    Ankle inversion    Ankle eversion    (Blank rows = not tested)  BED MOBILITY:  Findings: Sit to supine Mod A and Max A Supine to sit Mod A and Max A  TRANSFERS: Sit to stand: Min A and Mod A  Assistive device utilized: Environmental consultant - 2 wheeled     Stand to sit: Min A  Assistive device utilized: Environmental consultant - 2 wheeled     Chair to chair: Min A and Mod A  Assistive device utilized: Environmental consultant - 2 wheeled      PT required to stabilize the LUE on RW as well as provide max cues for attention to hand to prevent sliding off hand grip.  At home Pt utilizes hand splint for stability on RW to prevent loss of grip in transfers.   RAMP:  Not tested  CURB:  Not tested  STAIRS: Not tested GAIT: Findings: Gait Characteristics: step to pattern, decreased hip/knee flexion- Left, Left foot flat, ataxic, lateral lean- Left, wide BOS, and poor foot clearance- Left, Distance walked: 32ft, Assistive device utilized:Walker - 2 wheeled, Level of assistance: Mod A and Max A, and Comments: poor awareness of lateral/anterior L LOB as well as poor awareness of LUE causing loos of grip on RW and heavy lateral LOB to the L, +2 assist required to prevent fall and position chair under patient as he attempted  turn at 33ft.    FUNCTIONAL TESTS:  5 times sit to stand: 1:29 min Timed up and go (TUG): unable to complete turn at 3ft.  10 meter walk test: unable to complete on this day.   PATIENT SURVEYS:  Stroke Impact Scale 49/80                                                                                                                              TREATMENT DATE: 09/11/2023   Pt wearing L  swedish knee cage for therapy. Pt's wife brought in pt's personal L hand splint to use on bari-RW during session. Pt's wife requested to use clinic's resting hand splint moving forward because donning/doffing pt's hand splint from his RW is impacting how it fits on pt's RW at home.    Sit<>stand with RW and hand splint x 5.  Pregait reciprocal stepping. X 8 bil with UE supported on RW.  Seated LAQ x 10 bil with assist from PT to support femur and allow full knee ROM with decreased synergist activation of glutes.  Seated march x 8 bil cues  Seated hip adduction/abduction x 12   Gait RW, knee cage and hand splint 47ft with 2 90 deg turns to the R and 1 180deg turn to the L. Min assist over all and moderate mutlimodal cues for sequencing of turns, symmetrical step length to prevent excessive step on the RLE and improved awarenss of LLE and LUE when fatigued.   Sit<>supine with mod assist for control of the RLE and RUE.  SAQ x 10 bil  Hip abduction with straight leg x 10 bil  Bridge x 5 with bil shoes in place.    PATIENT EDUCATION: Education details: Pt educated throughout session about proper posture and technique with exercises. Improved exercise technique, movement at target joints, use of target muscles after min to mod verbal, visual, tactile cues.  Person educated: Patient and Spouse Education method: Explanation, VC/TC/Demo Education comprehension: verbalized understanding  HOME EXERCISE PROGRAM:  Access Code: [email or web address] URL: [email or web address] Date: 09/11/2023 Prepared by: Aurora Lees  Exercises - Seated Knee Extension AROM  - 1 x daily - 4 x weekly - 3 sets - 10 reps - Seated Hip Abduction  - 1 x daily - 4 x weekly - 3 sets - 10 reps - Seated March  - 1 x daily - 4 x weekly - 3 sets - 10 reps - Sit to Stand with Counter Support  - 1 x daily - 4 x weekly - 3 sets - 10 reps - Supine Short Arc Quad  - 1 x daily - 4 x weekly - 3 sets - 10 reps - Supine Hip  Abduction  - 1 x daily - 4 x weekly - 3 sets - 10 reps - Supine Bridge  - 1 x daily - 4 x weekly - 3 sets - 3 reps - 2sec  hold  GOALS: Goals reviewed with patient? Yes  SHORT TERM GOALS: Target date: 09/25/2023    Patient will be independent in home exercise program to improve strength/mobility for better functional independence with ADLs. Baseline: To be provided at upcoming visits Goal status: INITIAL   LONG TERM GOALS: Target date: 11/13/2023    Patient will increase SIS-16 score to equal to or greater than 10points    to demonstrate statistically significant improvement in mobility and quality of life.  Baseline: 49/80 Goal status: INITIAL  2.  Patient (> 4 years old) will complete five times sit to stand test in < 15 seconds indicating an increased LE strength and improved balance. Baseline: 1:26min Goal status: INITIAL  3.  Patient will increase FIST score by > 6 points to demonstrate decreased fall risk during functional activities Baseline:  43/56  Goal status: INITIAL  4.  Patient will increase 10 meter walk test to >0.64m/s as to improve gait speed for better community ambulation and to reduce fall risk. Baseline: 09/09/2023: 0.103 m/s ( and 37 seconds) using bari-RW with skilled heavy min A and +2 w/c follow for safety  Goal status: INITIAL  5.  Patient will reduce timed up and go to <11 seconds to reduce fall risk and demonstrate improved transfer/gait ability. Baseline: unable to complete turn at 10 ft. Mod-max assist due to poor control of LW on RW, resulting in L lateral LOB  09/09/2023: and 32 seconds using bari-RW with L hand splint and +2 on R side for safety, therapist providing skilled min A on L side (Had pt set-up L hand on RW splint, get L foot in proper positioning, and scoot forward in seat before starting the timer) Goal status: INITIAL   ASSESSMENT:  CLINICAL IMPRESSION:  Patient is a 73 y.o. male who was seen today for physical therapy  treatment for balance, coordination, and gait deficits following CVA in April. Pt has hx of MG and was initially treated for MG exacerbation, but was then found to have CVA affecting LUE>LLE as well as poor awareness of L side. Patient's wife again brought in pt's L RW hand splint for use during session allowing increased participation in standing and gait training because pt with significant L hemibody inattention/impaired proprioception impacting his awareness and coordination of that side. Pt continued to increase gait training with increased turns requiring only min assist, but still mod verbal instruction for sequencing, AD management and awareness of the LLE. HEP provided for BLE strengthening and isolated movements in sitting and supine. Pt will benefit from continued skilled PT to address strength, balance, coordination, attention deficits to improve function, increase independence with functional mobility, and overall QoL and return to PLOF.   OBJECTIVE IMPAIRMENTS: Abnormal gait, cardiopulmonary status limiting activity, decreased activity tolerance, decreased balance, decreased cognition, decreased coordination, decreased endurance, decreased knowledge of condition, decreased knowledge of use of DME, decreased mobility, difficulty walking, decreased ROM, decreased strength, decreased safety awareness, dizziness, hypomobility, increased fascial restrictions, impaired perceived functional ability, increased muscle spasms, impaired sensation, impaired tone, impaired UE functional use, impaired vision/preception, improper body mechanics, postural dysfunction, and obesity.   ACTIVITY LIMITATIONS: carrying, lifting, bending, sitting, standing, squatting, stairs, transfers, bed mobility, bathing, toileting, dressing, reach over head, hygiene/grooming, locomotion level, and caring for others  PARTICIPATION LIMITATIONS: meal prep, cleaning, laundry, medication management, interpersonal relationship, driving,  shopping, community activity, and yard work  PERSONAL FACTORS: Age, Past/current experiences, Time since onset of injury/illness/exacerbation, and 3+ comorbidities: MG, HTN, CVA, lymphedema are also affecting patient's functional outcome.   REHAB POTENTIAL:  Good  CLINICAL DECISION MAKING: Unstable/unpredictable  EVALUATION COMPLEXITY: High  PLAN:  PT FREQUENCY: 1-2x/week  PT DURATION: 12 weeks  PLANNED INTERVENTIONS: 97164- PT Re-evaluation, 97750- Physical Performance Testing, 97110-Therapeutic exercises, 97530- Therapeutic activity, [id number]- Neuromuscular re-education, 97535- Self Care, 29562- Manual therapy, [id number]- Gait training, [phone]- Orthotic Initial, [phone]- Orthotic/Prosthetic subsequent, [phone]- Canalith repositioning, [id number]- Electrical stimulation (unattended), [phone]- Electrical stimulation (manual), [phone]- Wound care (first 20 sq cm), 97598- Wound care (each additional 20 sq cm), Patient/Family education, Balance training, Stair training, Taping, Dry Needling, Joint mobilization, Joint manipulation, Vestibular training, Visual/preceptual remediation/compensation, Cognitive remediation, DME instructions, Wheelchair mobility training, Cryotherapy, and Moist heat  PLAN FOR NEXT SESSION:  Review HEP as time allows  Gait training with L hand splint on bari-RW  Continue progression of turning through doorways and turning to sit in chair at end of gait Continue reinforcing pt management of L hand placement on/off hand splint during transfers Work on midline orientation and R weight shifting Work on L LE coordination and strength for improved swing phase advancement   Aurora Lees PT, DPT  Physical Therapist - [facility]  [facility]  1:43 PM 09/11/23

## 2023-09-17 ENCOUNTER — Ambulatory Visit: Admitting: Speech Pathology

## 2023-09-17 ENCOUNTER — Ambulatory Visit: Admitting: Physical Therapy

## 2023-09-17 ENCOUNTER — Ambulatory Visit: Admitting: Occupational Therapy

## 2023-09-17 DIAGNOSIS — R278 Other lack of coordination: Secondary | ICD-10-CM

## 2023-09-17 DIAGNOSIS — M6281 Muscle weakness (generalized): Secondary | ICD-10-CM

## 2023-09-17 DIAGNOSIS — I69354 Hemiplegia and hemiparesis following cerebral infarction affecting left non-dominant side: Secondary | ICD-10-CM

## 2023-09-17 DIAGNOSIS — R2681 Unsteadiness on feet: Secondary | ICD-10-CM

## 2023-09-17 DIAGNOSIS — R262 Difficulty in walking, not elsewhere classified: Secondary | ICD-10-CM

## 2023-09-17 DIAGNOSIS — R41841 Cognitive communication deficit: Secondary | ICD-10-CM

## 2023-09-17 NOTE — Therapy (Signed)
 OUTPATIENT PHYSICAL THERAPY NEURO TREATMENT   Patient Name: [name]. MRN: [id number] DOB:08-08-1950, 73 y.o., male Today's Date: 09/17/2023   PCP: Dennise Remak, MD  REFERRING PROVIDER:    Pegge Toribio PARAS, PA-C    END OF SESSION:   PT End of Session - 09/17/23 0938     Visit Number 9    Number of Visits 24    Date for PT Re-Evaluation 11/12/23    PT Start Time 1100    PT Stop Time 1140    PT Time Calculation (min) 40 min    Equipment Utilized During Treatment Gait belt    Activity Tolerance Patient tolerated treatment well    Behavior During Therapy WFL for tasks assessed/performed              Past Medical History:  Diagnosis Date   Anemia    Cervical myelopathy (HCC)    Chronic venous insufficiency    [name] (chronic kidney disease), stage III (HCC)    Diabetes mellitus without complication (HCC)    GERD (gastroesophageal reflux disease)    Hypercholesteremia    Hypothyroidism    Kidney stones    about 50 in the past--last in March 4/24   Leg weakness, bilateral    due to Myasthenia Gravis   Myasthenia gravis (HCC)    Spinal stenosis    Vitamin B 12 deficiency    Vitamin B 12 deficiency    Wears hearing aid    bilateral   Past Surgical History:  Procedure Laterality Date   BACK SURGERY  09/2015   CARDIAC CATHETERIZATION     CATARACT EXTRACTION W/ INTRAOCULAR LENS IMPLANT     CATARACT EXTRACTION W/PHACO Right 12/21/2015   Procedure: CATARACT EXTRACTION PHACO AND INTRAOCULAR LENS PLACEMENT (IOC);  Surgeon: Dene Etienne, MD;  Location: [facility];  Service: Ophthalmology;  Laterality: Right;  DIABETIC - insulin  and oral meds   COLONOSCOPY     SHOULDER SURGERY     labrum repair   TONSILLECTOMY     Patient Active Problem List   Diagnosis Date Noted   High blood pressure 08/13/2023   Anemia 08/13/2023   Constipation 08/13/2023   Myasthenia gravis (HCC) 08/13/2023   Acute ischemic right MCA stroke (HCC) 07/19/2023    Lymphedema 12/27/2016   Leg pain 07/15/2016   Chronic venous insufficiency 07/15/2016   Swelling of limb 07/15/2016   Type 2 diabetes mellitus (HCC) 07/15/2016   Hyperlipidemia 07/15/2016    ONSET DATE:   REFERRING DIAG:  Diagnosis  I63.511 (ICD-10-CM) - Cerebral infarction due to unspecified occlusion or stenosis of right middle cerebral artery    THERAPY DIAG:  Muscle weakness (generalized)  Difficulty in walking, not elsewhere classified  Unsteadiness on feet  Hemiplegia and hemiparesis following cerebral infarction affecting left non-dominant side (HCC)  Rationale for Evaluation and Treatment: Rehabilitation  SUBJECTIVE:  SUBJECTIVE STATEMENT:  Pt reports that he is doing well. No changes since last date. Continued difficulty with getting LLE in and out of vehicle on passenger side.    Pt reports his goal is to learn to walk again if at all possible, even if it is only 10 feet, and he wants to focus on walking during every PT session.     From Initial Eval: Pt reports that he is doing well. Has not been moving much since discharging from [facility], as therapists instructed pt to perform transfers only at home due to high fall risk with ambulation. Continues to have significant weakenss in the LUE with difficulty extending fingers as well as numbness and inattention to the L side of body resulting in multiple cuts on Arm and leg with WC mobility in home.  Will be attaining custom power WC from [employer]. Has loaner currently.   Pt accompanied by: significant other [name]   PERTINENT HISTORY: [name]. is a 73 y.o. male with history of T2DM, HTN, [name], renal calculi, seropositive myasthenia gravis who was admitted to [facility] on 07/14/2023 with reports 3 to 4-day history of stuttering speech  and fluctuating LUE weakness followed by fall.  He reported some double vision and per discussion with his neurology I had increased prednisone  to 10 mg/day due to concerns of MG flare.  He was found to have left facial weakness with dysarthria as well as LUE and LLE weakness.  MRI brain done revealing acute right MCA infarction involving right periventricular white matter and extending into right frontal and parietal lobes.    [name]. was admitted to rehab 07/19/2023 for inpatient therapies to consist of PT, ST and OT at least three hours five days a week. Past admission physiatrist, therapy team and rehab RN have worked together to provide customized collaborative inpatient rehab.  He continues on low-dose aspirin  and subcu Lovenox  was used for DVT prophylaxis during his stay.  His blood pressures were monitored on TID basis and has been stable on current regimen.  Serial check of CBC shows H&H to be relatively stable stool guaiac was negative x 1.  He continues to have lower extremity edema and was educated on importance of elevation for edema control.  Constipation has resolved with sorbitol  use on intermittent basis.  P.o. intake has been good.  He was noted to have Candida crura's which was treated with course of Diflucan.  MASD has resolved with use of Gerhardt's Butt cream.   Left hemiplegia as been a limiting factor with left knee pain noted with increase in activity.  EKG was added for support and pain management.  His diabetes has been monitored with ac/hs CBG checks and SSI was use prn for tighter BS control.  Metformin  was resumed at 5 mg twice daily and basal insulin  was slowly titrated upwards to 26 units.  Blood sugars continue to be variable and he was advised to increase metformin  to home dose and titrate insulin  glargine slowly if blood sugars range over 150.  He has made steady gains during his stay and continues to be limited by mild left inattention with left knee plegia and  weakness. He has been evaluated for  speciality wheelchair by [employer]. He will continue to receive follow up outpatient PT, OT and ST at [facility] after discharge.   PAIN:  Are you having pain? No and reports Numbness in the L H and and Leg   PRECAUTIONS: Fall  **  reports gets rash from contact with latex  RED FLAGS: Hx of MG   WEIGHT BEARING RESTRICTIONS: No  FALLS: Has patient fallen in last 6 months? Yes. Number of falls 1  LIVING ENVIRONMENT: Lives with: lives with their spouse Lives in: House/apartment Stairs: Ramps installed while in the Hospital  Has following equipment at home: Vannie - 2 wheeled and Wheelchair (power)  PLOF: Independent with basic ADLs, Independent with household mobility without device, and Independent with community mobility with device with [facility]   PATIENT GOALS: relearn to Walk 15-56ft.   OBJECTIVE:  Note: Objective measures were completed at Evaluation unless otherwise noted.  DIAGNOSTIC FINDINGS: MRI brain done revealing acute right MCA infarction involving right periventricular white matter and extending into right frontal and parietal lobes.  COGNITION: Overall cognitive status: Impaired poor awareness of L side while dual tasking.    SENSATION: Light touch: Impaired   COORDINATION: Limited ROM and strength on the LUE limiting finger to nose.  ROM and strength deficits limit ankle to knee.   EDEMA:  Mild edema in the L UE.   MUSCLE TONE: LLE: Mild    POSTURE: flexed trunk. Lateral lean to the L   LOWER EXTREMITY ROM:     Active  Right Eval Left Eval  Hip flexion    Hip extension    Hip abduction    Hip adduction    Hip internal rotation    Hip external rotation    Knee flexion    Knee extension    Ankle dorsiflexion    Ankle plantarflexion    Ankle inversion    Ankle eversion     (Blank rows = not tested)  LOWER EXTREMITY MMT:    MMT Right Eval Left Eval  Hip flexion 4+ 4-  Hip extension 4+ 3+  Hip  abduction 4+ 3+  Hip adduction 4+ 3+  Hip internal rotation    Hip external rotation    Knee flexion 5 4-  Knee extension 5 4-  Ankle dorsiflexion 5 4  Ankle plantarflexion    Ankle inversion    Ankle eversion    (Blank rows = not tested)  BED MOBILITY:  Findings: Sit to supine Mod A and Max A Supine to sit Mod A and Max A  TRANSFERS: Sit to stand: Min A and Mod A  Assistive device utilized: Environmental consultant - 2 wheeled     Stand to sit: Min A  Assistive device utilized: Environmental consultant - 2 wheeled     Chair to chair: Min A and Mod A  Assistive device utilized: Environmental consultant - 2 wheeled      PT required to stabilize the LUE on RW as well as provide max cues for attention to hand to prevent sliding off hand grip.  At home Pt utilizes hand splint for stability on RW to prevent loss of grip in transfers.   RAMP:  Not tested  CURB:  Not tested  STAIRS: Not tested GAIT: Findings: Gait Characteristics: step to pattern, decreased hip/knee flexion- Left, Left foot flat, ataxic, lateral lean- Left, wide BOS, and poor foot clearance- Left, Distance walked: 58ft, Assistive device utilized:Walker - 2 wheeled, Level of assistance: Mod A and Max A, and Comments: poor awareness of lateral/anterior L LOB as well as poor awareness of LUE causing loos of grip on RW and heavy lateral LOB to the L, +2 assist required to prevent fall and position chair under patient as he attempted turn at 23ft.    FUNCTIONAL TESTS:  5  times sit to stand: 1:29 min Timed up and go (TUG): unable to complete turn at 43ft.  10 meter walk test: unable to complete on this day.   PATIENT SURVEYS:  Stroke Impact Scale 49/80                                                                                                                              TREATMENT DATE: 09/17/2023   Pt wearing L swedish knee cage for therapy. Pt's wife brought in pt's personal L hand splint to use on bari-RW during session. Pt's wife requested to use clinic's resting  hand splint moving forward because donning/doffing pt's hand splint from his RW is impacting how it fits on pt's RW at home.   TA- To improve functional movements patterns for everyday tasks   Sit to stand UE assist on armchair and independent stabilization in standing with close CGA 2 x 5 reps  -cues for left weight shift and equal WB on reps   -used this strategy moving forward with gait activities with PT assisting in donning hand brace to walker each stand   TE- To improve strength, endurance, mobility, and function of specific targeted muscle groups or improve joint range of motion or improve muscle flexibility  Seate LLE HS curl with red bolster 2 x 10 reps with some PT assistance.   Gait training:   Gait RW, knee cage and hand splint x 3 rounds, cues for R weight shift and for proper initial foot clearance on LLE with swing phase.  -with fatigue pt had adducted gait on LLE as well as increased difficulty with L foot clearance and more reliance on Ues.   -end of second round of gait had pt transfer from stand to sit in standard chair, cues for proper form and safety with this, pt initially walks too far past chair. Start of third trial pt had to perform STS transition and make 180 degree turn   Round 1: 37 ft, round 2: 65 ft, round 3: 42 ft     PATIENT EDUCATION: Education details: Pt educated throughout session about proper posture and technique with exercises. Improved exercise technique, movement at target joints, use of target muscles after min to mod verbal, visual, tactile cues.  Person educated: Patient and Spouse Education method: Explanation, VC/TC/Demo Education comprehension: verbalized understanding  HOME EXERCISE PROGRAM:  Access Code: [email or web address] URL: [email or web address] Date: 09/11/2023 Prepared by: Massie Dollar  Exercises - Seated Knee Extension AROM  - 1 x daily - 4 x weekly - 3 sets - 10 reps - Seated Hip Abduction  - 1 x daily - 4 x weekly -  3 sets - 10 reps - Seated March  - 1 x daily - 4 x weekly - 3 sets - 10 reps - Sit to Stand with Counter Support  - 1 x daily - 4 x weekly - 3 sets - 10 reps - Supine Short Arc Quad  - 1  x daily - 4 x weekly - 3 sets - 10 reps - Supine Hip Abduction  - 1 x daily - 4 x weekly - 3 sets - 10 reps - Supine Bridge  - 1 x daily - 4 x weekly - 3 sets - 3 reps - 2sec  hold  GOALS: Goals reviewed with patient? Yes  SHORT TERM GOALS: Target date: 09/25/2023    Patient will be independent in home exercise program to improve strength/mobility for better functional independence with ADLs. Baseline: To be provided at upcoming visits Goal status: INITIAL   LONG TERM GOALS: Target date: 11/13/2023    Patient will increase SIS-16 score to equal to or greater than 10points    to demonstrate statistically significant improvement in mobility and quality of life.  Baseline: 49/80 Goal status: INITIAL  2.  Patient (> 60 years old) will complete five times sit to stand test in < 15 seconds indicating an increased LE strength and improved balance. Baseline: 1:10min Goal status: INITIAL  3.  Patient will increase FIST score by > 6 points to demonstrate decreased fall risk during functional activities Baseline:  43/56  Goal status: INITIAL  4.  Patient will increase 10 meter walk test to >0.72m/s as to improve gait speed for better community ambulation and to reduce fall risk. Baseline: 09/09/2023: 0.103 m/s ( and 37 seconds) using bari-RW with skilled heavy min A and +2 w/c follow for safety  Goal status: INITIAL  5.  Patient will reduce timed up and go to <11 seconds to reduce fall risk and demonstrate improved transfer/gait ability. Baseline: unable to complete turn at 10 ft. Mod-max assist due to poor control of LW on RW, resulting in L lateral LOB  09/09/2023: and 32 seconds using bari-RW with L hand splint and +2 on R side for safety, therapist providing skilled min A on L side (Had pt set-up  L hand on RW splint, get L foot in proper positioning, and scoot forward in seat before starting the timer) Goal status: INITIAL   ASSESSMENT:  CLINICAL IMPRESSION:   Patient is a 73 y.o. male who was seen today for physical therapy treatment for balance, coordination, and gait deficits following CVA in April. Pt treatment focussed on functional transfer training with focus on improving LLE muscle activation with these activities. Pt also continues with gait training, pt has more consistent foot clearance with LLE but with fatigue he has high difficulty with foot clearance and adducing the LLE for initial contact creating a potential tripping situation. Close CGA provided throughout session. Pt will continue to benefit from skilled physical therapy intervention to address impairments, improve QOL, and attain therapy goals.    OBJECTIVE IMPAIRMENTS: Abnormal gait, cardiopulmonary status limiting activity, decreased activity tolerance, decreased balance, decreased cognition, decreased coordination, decreased endurance, decreased knowledge of condition, decreased knowledge of use of DME, decreased mobility, difficulty walking, decreased ROM, decreased strength, decreased safety awareness, dizziness, hypomobility, increased fascial restrictions, impaired perceived functional ability, increased muscle spasms, impaired sensation, impaired tone, impaired UE functional use, impaired vision/preception, improper body mechanics, postural dysfunction, and obesity.   ACTIVITY LIMITATIONS: carrying, lifting, bending, sitting, standing, squatting, stairs, transfers, bed mobility, bathing, toileting, dressing, reach over head, hygiene/grooming, locomotion level, and caring for others  PARTICIPATION LIMITATIONS: meal prep, cleaning, laundry, medication management, interpersonal relationship, driving, shopping, community activity, and yard work  PERSONAL FACTORS: Age, Past/current experiences, Time since onset of  injury/illness/exacerbation, and 3+ comorbidities: MG, HTN, CVA, lymphedema are also affecting patient's functional  outcome.   REHAB POTENTIAL: Good  CLINICAL DECISION MAKING: Unstable/unpredictable  EVALUATION COMPLEXITY: High  PLAN:  PT FREQUENCY: 1-2x/week  PT DURATION: 12 weeks  PLANNED INTERVENTIONS: 97164- PT Re-evaluation, 97750- Physical Performance Testing, 97110-Therapeutic exercises, 97530- Therapeutic activity, [id number]- Neuromuscular re-education, 97535- Self Care, [id number]- Manual therapy, [id number]- Gait training, [phone]- Orthotic Initial, [phone]- Orthotic/Prosthetic subsequent, [phone]- Canalith repositioning, [id number]- Electrical stimulation (unattended), [phone]- Electrical stimulation (manual), [id number]- Wound care (first 20 sq cm), 97598- Wound care (each additional 20 sq cm), Patient/Family education, Balance training, Stair training, Taping, Dry Needling, Joint mobilization, Joint manipulation, Vestibular training, Visual/preceptual remediation/compensation, Cognitive remediation, DME instructions, Wheelchair mobility training, Cryotherapy, and Moist heat  PLAN FOR NEXT SESSION:  Review HEP as time allows  Gait training with L hand splint on bari-RW  Continue progression of turning through doorways and turning to sit in chair at end of gait Continue reinforcing pt management of L hand placement on/off hand splint during transfers Work on midline orientation and R weight shifting Work on L LE coordination and strength for improved swing phase advancement  Note: Portions of this document were prepared using Conservation officer, historic buildings and although reviewed may contain unintentional dictation errors in syntax, grammar, or spelling.  Lonni KATHEE Gainer PT ,DPT Physical Therapist- [place]  [facility]   9:39 AM 09/17/23

## 2023-09-17 NOTE — Therapy (Addendum)
 OUTPATIENT OCCUPATIONAL THERAPY NEURO TREATMENT  Patient Name: [name]. MRN: [id number] DOB:January 24, 1951, 73 y.o., male Today's Date: 09/17/2023  PCP: Alm Rower, MD REFERRING PROVIDER: Hermann Toribio PARAS, PA-C  END OF SESSION:  OT End of Session - 09/17/23 1331     Visit Number 9    Number of Visits 24    Date for OT Re-Evaluation 11/12/23    OT Start Time 0930    OT Stop Time 1015    OT Time Calculation (min) 45 min    Activity Tolerance Patient tolerated treatment well    Behavior During Therapy WFL for tasks assessed/performed          Past Medical History:  Diagnosis Date   Anemia    Cervical myelopathy (HCC)    Chronic venous insufficiency    [name] (chronic kidney disease), stage III (HCC)    Diabetes mellitus without complication (HCC)    GERD (gastroesophageal reflux disease)    Hypercholesteremia    Hypothyroidism    Kidney stones    about 50 in the past--last in March 4/24   Leg weakness, bilateral    due to Myasthenia Gravis   Myasthenia gravis (HCC)    Spinal stenosis    Vitamin B 12 deficiency    Vitamin B 12 deficiency    Wears hearing aid    bilateral   Past Surgical History:  Procedure Laterality Date   BACK SURGERY  09/2015   CARDIAC CATHETERIZATION     CATARACT EXTRACTION W/ INTRAOCULAR LENS IMPLANT     CATARACT EXTRACTION W/PHACO Right 12/21/2015   Procedure: CATARACT EXTRACTION PHACO AND INTRAOCULAR LENS PLACEMENT (IOC);  Surgeon: Dene Etienne, MD;  Location: [facility];  Service: Ophthalmology;  Laterality: Right;  DIABETIC - insulin  and oral meds   COLONOSCOPY     SHOULDER SURGERY     labrum repair   TONSILLECTOMY     Patient Active Problem List   Diagnosis Date Noted   High blood pressure 08/13/2023   Anemia 08/13/2023   Constipation 08/13/2023   Myasthenia gravis (HCC) 08/13/2023   Acute ischemic right MCA stroke (HCC) 07/19/2023   Lymphedema 12/27/2016   Leg pain 07/15/2016   Chronic venous  insufficiency 07/15/2016   Swelling of limb 07/15/2016   Type 2 diabetes mellitus (HCC) 07/15/2016   Hyperlipidemia 07/15/2016   ONSET DATE: 07/13/2023  REFERRING DIAG: Acute ischemic R MCA CVA  THERAPY DIAG: muscle weakness (generalized), other lack of coordination, vision disturbance, hemiplegia and hemiparesis following cerebral infarction affecting left non-dominant side (HCC)  Rationale for Evaluation and Treatment: Rehabilitation  SUBJECTIVE:  SUBJECTIVE STATEMENT: Pt reports tightness on the dorsal aspect of the forearm prior to treatment session.  Pt accompanied by: significant other  PERTINENT HISTORY: Pt. Was admitted to [facility] on 07/13/2023 after sustaining a CVA while on a trip to the beach. Pt. Was diagnosed with R MCA CVA. Pt. Was admitted to inpatient rehab from 07/19/2023-08/13/2023. Past medical history includes high BP, anemia, and Myasthenia Gravis.   PRECAUTIONS: None  WEIGHT BEARING RESTRICTIONS: No  PAIN:  Are you having pain? 3/10 at the dorsal forearm in the left wrist flexors, L hand digit flexors  FALLS: Has patient fallen in last 6 months? Yes  LIVING ENVIRONMENT: Lives with: lives with their family and lives with their spouse-  Lives in: House/apartment- 2 story home and resides mostly on the 1st floor Stairs: No- Ramped entrance Has following equipment at home:  Vannie, cane, Steadi, shower chair, handheld shower head, bedside  commode  PLOF: Independent and Independent with basic ADLs Independent at home   PATIENT GOALS: Would like to walk 10-20 feet each time.   OBJECTIVE:  Note: Objective measures were completed at Evaluation unless otherwise noted.  HAND DOMINANCE: Right  ADLs: Overall ADLs:  Transfers/ambulation related to ADLs: Eating: Independent, 100% R handed Grooming: independent UB Dressing: Can put on shirt independently LB Dressing: Difficulty with pants and requires assistance getting the pants on his feet, has difficulty putting  on shoes and socks Toileting: Tot A for toilet hygiene. Uses Steadi during toilet transfers. Bathing: Requires assist with using the L arm to wash the R arm. Tub Shower transfers: Roll in shower, built in shower chair, grab bars and hand held shower head.  IADLs: Shopping: Total A (Wife typically did the shopping prior to onset)   Light housekeeping: Total A (wife typically performs prior to onset) Meal Prep: Independent with light snack prep Community mobility: No driving, able to get in/out of the car Medication management: Set up assistance from his wife using a pill box (pt. Is responsible for taking medications at the correct time) Financial management: family members check behind him. Handwriting: TBD Hobbies: gardening, wood working and sports- [employer] Work history: Owns a tax business MOBILITY STATUS: Needs Assist:    POSTURE COMMENTS:   Sitting balance: Good supported sitting balance  FUNCTIONAL OUTCOME MEASURES: TBD   UPPER EXTREMITY ROM:    Active ROM Right Eval  WFL Left eval  Shoulder flexion  89(110)  Shoulder abduction  98(120)  Shoulder adduction    Shoulder extension    Shoulder internal rotation    Shoulder external rotation    Elbow flexion  120(140)  Elbow extension  -28(-10)  Wrist flexion  60(60) Rest at 60  Wrist extension  -48(42)  Wrist ulnar deviation    Wrist radial deviation    Wrist pronation    Wrist supination    (Blank rows = not tested)  L Digit flexion: 2nd:3cm (0cm) 3rd: 0cm (0cm), 4th: 0cm (0cm), 5th: 2cm (0cm)  L Digit extension: Active Gross digit extension through 10% of ROM UPPER EXTREMITY MMT:     MMT Right eval Left eval  Shoulder flexion 5 3-  Shoulder abduction 5 3-  Shoulder adduction    Shoulder extension    Shoulder internal rotation    Shoulder external rotation    Middle trapezius    Lower trapezius    Elbow flexion 5 TBD  Elbow extension 5 TBD  Wrist flexion    Wrist extension  2-  Wrist ulnar  deviation    Wrist radial deviation    Wrist pronation    Wrist supination    (Blank rows = not tested)  HAND FUNCTION:   COORDINATION:    SENSATION: Sensation feels different in both hands.  L hand Light touch- impaired L hand Proprioception- impaired  EDEMA: Has had hx of edema, and came to Eval session with swelling in the L hand/wrist/forearm.   *pt. Has a laceration from his dog on the dorsal aspect of the L hand.   MUSCLE TONE: Increased tone through the UE and hand flexors.   COGNITION: Overall cognitive status: Within functional limits for tasks assessed  VISION: Subjective report: L sided inattention Baseline vision: Wears glasses for reading only Visual history: Right visual occlusion  VISION ASSESSMENT: To be assessed  PERCEPTION: TBD  PRAXIS: Impaired  TREATMENT DATE: 09/17/2023  Manual Therapy:    -Pt. tolerated retrograde massage to the Left hand for edema control 2/2 increased edema.  -STM was performed to the volar aspect of the left forearm 2/2 pain with wrist, and digit extension. -Manual therapy was performed independent of, and in preparation for therapeutic Ex.     Therapeutic Exercise:   -AAROM/AROM stretching performed throughout the LUE, including L shoulder through all planes within pain free ranges, elbow flex/ext, forearm pron/sup, wrist flex/ext, and digit flex/ext.    Therapeutic Activities:  -Pt. tolerated reps of left wrist extension with facilitory tapping to the wrist extensors in the dorsal forearm. Support was provided proximally for elbow extension. -Facilitated grasping 1.5 [place]  styled discs with emphasis on facilitating digit extension when releasing the discs sustaining the grasp during wrist extension, then releasing them onto floor with facilitory tapping to the dorsal forearm at the wrist, and digit  extensors.  PATIENT EDUCATION: Education details:  LUE reaching/grasp/release activities Person educated: Patient and Spouse Education method: Explanation, Demonstration, Tactile cues, and Verbal cues Education comprehension: verbalized understanding, returned demonstration, and needs further education  HOME EXERCISE PROGRAM: Self passive stretching throughout the LUE; grasp/release activities   GOALS: Goals reviewed with patient? Yes  SHORT TERM GOALS: Target date: 10/01/2023   Pt. Will be independent with HEP for the LUE.  Baseline: Eval; no current HEP Goal status: INITIAL   LONG TERM GOALS: Target date: 11/12/2023  Pt. Will increase L shoulder flexion by 10 degrees to be able to reach up to shelves.  Baseline: Eval; L shoulder flexion is 89 (110). Goal status: INITIAL  2.  Pt. Will increase L shoulder abduction by 10 degrees to assist with underarm hygiene.  Baseline: Eval: L shoulder abduction is 98 (120).  Goal status: INITIAL  3.  Pt. Will improve L wrist extension by 10 degrees to be able to initiate anticipation of grasping for objects.  Baseline: Eval: -48 (42) Goal status: INITIAL  4.  Pt. Will improve active gross digit extension through 25% of the range to be able to release objects in his hand consistently.  Baseline: Eval: gross digit extension is 10% of the range  Goal status: INITIAL  5.  Pt. Will independently demonstrate visual compensatory strategies when navigating through environment and during tabletop tasks. Baseline: Pt. Requires consistent cuing for L sided awareness.  Goal status: INITIAL  ASSESSMENT:  CLINICAL IMPRESSION:  Pt. presents with weakness with the left wrist, and digit extensors. Pt. Brought in nocturnal resting hand splint to be assessed for an adjustment. The splint fit was adjusted at the thumb, to decrease the thumb web web space 2/2 excessive stretch at the thumb web space. Pt. Tolerated edema massage well today, 2/2 edema on  the volar/dorsal aspect of the L hand. Pt. Presents with difficulty performing  digit and wrist extension while sustaining elbow extension during functional reaching, due to weakness in the LUE. Pt. Requires Min vc to extend digits, wrist and elbow to release objects. Pt continues to benefit from OT services to work on improving LUE functioning to increase engagement of the LUE during daily task, and to provide education about compensatory strategies during ADLs/IADLs.  PERFORMANCE DEFICITS: in functional skills including ADLs, IADLs, coordination, dexterity, proprioception, sensation, edema, tone, ROM, strength, pain, Fine motor control, Gross motor control, mobility, balance, endurance, vision, and UE functional use, cognitive skills including perception, and psychosocial skills including coping strategies, environmental adaptation, and routines and behaviors.   IMPAIRMENTS: are limiting patient from  ADLs, IADLs, and leisure.   CO-MORBIDITIES: may have co-morbidities  that affects occupational performance. Patient will benefit from skilled OT to address above impairments and improve overall function.  MODIFICATION OR ASSISTANCE TO COMPLETE EVALUATION: Min-Moderate modification of tasks or assist with assess necessary to complete an evaluation.  OT OCCUPATIONAL PROFILE AND HISTORY: Detailed assessment: Review of records and additional review of physical, cognitive, psychosocial history related to current functional performance.  CLINICAL DECISION MAKING: Moderate - several treatment options, min-mod task modification necessary  REHAB POTENTIAL: Good  EVALUATION COMPLEXITY: Moderate    PLAN:  OT FREQUENCY: 2x/week  OT DURATION: 12 weeks  PLANNED INTERVENTIONS: 97168 OT Re-evaluation, 97535 self care/ADL training, 02889 therapeutic exercise, 97530 therapeutic activity, 97112 neuromuscular re-education, 97140 manual therapy, 97018 paraffin, 02989 moist heat, 97010 cryotherapy, 97034 contrast  bath, 97760 Orthotic Initial, 97763 Orthotic/Prosthetic subsequent, passive range of motion, visual/perceptual remediation/compensation, energy conservation, and DME and/or AE instructions  RECOMMENDED OTHER SERVICES: PT and ST  CONSULTED AND AGREED WITH PLAN OF CARE: Patient and family member/caregiver  PLAN FOR NEXT SESSION: see above  Damien Nap, OTS   This entire session was performed under direct supervision and direction of a licensed therapist/therapist assistant . I have personally read, edited and approve of the note as written.  Elaine Jagentenfl, MS, OTR/L

## 2023-09-17 NOTE — Therapy (Addendum)
 OUTPATIENT SPEECH LANGUAGE PATHOLOGY  COGNITION TREATMENT NOTE   Patient Name: Dominyk Law. MRN: [id number] DOB:1950-07-03, 73 y.o., male Today's Date: 09/17/2023  PCP: Rolin Stank, MD REFERRING PROVIDER: Toribio pitch, PA-C   End of Session - 09/17/23 1055     Visit Number 8    Number of Visits 25    Date for SLP Re-Evaluation 11/12/23    Authorization Type [employer]    Authorization Time Period 08/20/2023 thru 10/15/2023    Authorization - Visit Number 7    Authorization - Number of Visits 16    Progress Note Due on Visit 10    SLP Start Time 1015    SLP Stop Time  1100    SLP Time Calculation (min) 45 min    Activity Tolerance Patient tolerated treatment well          Past Medical History:  Diagnosis Date   Anemia    Cervical myelopathy (HCC)    Chronic venous insufficiency    [name] (chronic kidney disease), stage III (HCC)    Diabetes mellitus without complication (HCC)    GERD (gastroesophageal reflux disease)    Hypercholesteremia    Hypothyroidism    Kidney stones    about 50 in the past--last in March 4/24   Leg weakness, bilateral    due to Myasthenia Gravis   Myasthenia gravis (HCC)    Spinal stenosis    Vitamin B 12 deficiency    Vitamin B 12 deficiency    Wears hearing aid    bilateral   Past Surgical History:  Procedure Laterality Date   BACK SURGERY  09/2015   CARDIAC CATHETERIZATION     CATARACT EXTRACTION W/ INTRAOCULAR LENS IMPLANT     CATARACT EXTRACTION W/PHACO Right 12/21/2015   Procedure: CATARACT EXTRACTION PHACO AND INTRAOCULAR LENS PLACEMENT (IOC);  Surgeon: Dene Etienne, MD;  Location: [facility];  Service: Ophthalmology;  Laterality: Right;  DIABETIC - insulin  and oral meds   COLONOSCOPY     SHOULDER SURGERY     labrum repair   TONSILLECTOMY     Patient Active Problem List   Diagnosis Date Noted   High blood pressure 08/13/2023   Anemia 08/13/2023   Constipation 08/13/2023    Myasthenia gravis (HCC) 08/13/2023   Acute ischemic right MCA stroke (HCC) 07/19/2023   Lymphedema 12/27/2016   Leg pain 07/15/2016   Chronic venous insufficiency 07/15/2016   Swelling of limb 07/15/2016   Type 2 diabetes mellitus (HCC) 07/15/2016   Hyperlipidemia 07/15/2016    ONSET DATE: 07/19/2023   REFERRING DIAG: P36.488 (ICD-10-CM) - Cerebral infarction due to unspecified occlusion or stenosis of right middle cerebral artery  THERAPY DIAG:  Cognitive communication deficit  Rationale for Evaluation and Treatment Rehabilitation  SUBJECTIVE:   PERTINENT HISTORY and DIAGNOSTIC FINDINGS:   Mr. Rubinstein is a 73 y.o. 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  CT Brain w/o contrast: 07/14/2023 New hypodensity within the right periventricular white matter extending into the inferior frontal lobe/operculum compatible with a recent infarct.   MRI Brain w/o contrast: 07/15/2023 IMPRESSION:  Acute right MCA distribution infarct involving the right periventricular white matter extending into the right frontal and parietal lobes and insula , some of which correlates to the recent CT finding. No significant associated mass effect or associated hemorrhage.   MRA head angiogram 07/16/2023  IMPRESSION:  Focal occlusion of the right inferior division distal M2 MCA (6:123) in keeping with known right MCA infarct when compared to prior MRI brain 07/15/2023.   CT head 07/17/2023 IMPRESSION:  Expansion of territory involved in the right MCA infarct compared with MRI of 07/15/2023. Increasing local mass effect with new leftward midline shift of 3 mm. No hemorrhage.   Speech Language Evaluation at [facility] 07/19/2023 Patient presents with cognitive deficits in the areas of problem solving and left  inattention as evidenced throughout patient/family interview and performance on Cognistat evaluation. Patient further demonstrates impairments in the areas of intellectual, anticipatory, and emergent awareness of deficits/mistakes.   Speech Language Evaluation at [facility] 07/31/2023 Skilled therapy session focused on re-evaluation of cognitive skills utilizing the CLQT standardized assessment. Patient scored with overall mild cognitive deficits in the subtests of attention, executive functioning and visual spatial skills. Patient scored WFL on the clock drawing task, language and memory.   PAIN:  Are you having pain? No   FALLS: Has patient fallen in last 6 months?  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 SLP facilitated session by providing the following interventions:  As part of pt's job responsibilities, he alternates attention from paper to computer screen when transferring/entering information.   Constant Therapy Clinician was utilized: Alternating symbols: Level 4 - moderate to minimal A along with more than reasonable amount of time to achieve 75% accuracy, suspect that attention and working memory were deficits as intermittent compliment on ability reduced pt's accuracy - education provided on reducing external distractions during any tasks/activities to improve accuracy.   PATIENT EDUCATION: Education details: see  above Person educated: Patient and Spouse Education method: Explanation Education comprehension: needs further education   HOME EXERCISE PROGRAM:   See above for details    GOALS:  Goals reviewed with patient? Yes  SHORT TERM GOALS: Target date: 10 sessions  The patient will complete a simple word search puzzle (66 or smaller) within 10 minutes given intermittent moderate verbal cues. Baseline: Goal status: INITIAL  2.  The patient will recall sentence-level information after a 5-minute delay using the spaced retrieval technique. Baseline:  Goal status: INITIAL  3.  With moderate cues, pt will identify 3 cognitive strengths/weaknesses during tasks.  Baseline:  Goal status: INITIAL  LONG TERM GOALS: Target date: 11/12/2023  The patient will read sentence-level information aloud at 80% accuracy given intermittent minimal verbal cues in order to increase ability to safely live independently. Baseline:  Goal status: INITIAL  2.   With Min A, patient will use external aid for functional recall of completed/upcoming activities 75% accuracy.     Baseline:  Goal status: INITIAL  3.   With Min A, patient will complete semi-complex visual memory tasks with 75% accuracy independently.   Baseline:  Goal status: INITIAL   ASSESSMENT:  CLINICAL IMPRESSION: Patient is a 73 y.o. right handed male who was seen today for a cognitive communication treatment d/t right MCA CVA. Pt presents with overall  moderate cognitive impairment  with severe deficits in delayed memory, severe deficits in visuospatial/constructional abilities (apart from left inattention), decreased awareness of deficits/errors and mild deficits in higher level attention. Immediate memory, language and selective attention are relative strengths for pt.   Pt reports some progress. I am actually getting my leg up higher - getting in and out of the car. The biggest thing is that I can't remember the day/date but he  reports asking Alexia daily for information. His wife reports that actually he has been doing ok, working on my brother's estate.  See the above treatment note for details.   OBJECTIVE IMPAIRMENTS include attention, memory, awareness, and executive functioning. These impairments are limiting patient from return to work, managing medications, managing appointments, managing finances, household responsibilities, and ADLs/IADLs. Factors affecting potential to achieve goals and functional outcome are severity of impairments. Patient will benefit from skilled SLP services to address above impairments and improve overall function.  REHAB POTENTIAL: Good  PLAN: SLP FREQUENCY: 1-2x/week  SLP DURATION: 12 weeks  PLANNED INTERVENTIONS: Cognitive reorganization, Internal/external aids, Functional tasks, SLP instruction and feedback, Compensatory strategies, and Patient/family education   Aftin Lye B. Rubbie, M.S., CCC-SLP, Tree surgeon Certified Brain Injury Specialist [facility]  [facility] Rehabilitation Services Office [phone] Ascom [phone] Fax [phone]

## 2023-09-19 ENCOUNTER — Ambulatory Visit: Admitting: Physical Therapy

## 2023-09-19 ENCOUNTER — Ambulatory Visit: Admitting: Occupational Therapy

## 2023-09-19 DIAGNOSIS — M6281 Muscle weakness (generalized): Secondary | ICD-10-CM

## 2023-09-19 DIAGNOSIS — R269 Unspecified abnormalities of gait and mobility: Secondary | ICD-10-CM

## 2023-09-19 DIAGNOSIS — I69354 Hemiplegia and hemiparesis following cerebral infarction affecting left non-dominant side: Secondary | ICD-10-CM

## 2023-09-19 DIAGNOSIS — R278 Other lack of coordination: Secondary | ICD-10-CM

## 2023-09-19 DIAGNOSIS — R2681 Unsteadiness on feet: Secondary | ICD-10-CM

## 2023-09-19 DIAGNOSIS — R262 Difficulty in walking, not elsewhere classified: Secondary | ICD-10-CM

## 2023-09-19 NOTE — Therapy (Signed)
 OUTPATIENT PHYSICAL THERAPY NEURO TREATMENT  Physical Therapy Progress Note   Dates of reporting period  08/20/2023   to   09/19/2023   Patient Name: [name]. MRN: [id number] DOB:02/03/1951, 73 y.o., male Today's Date: 09/19/2023   PCP: Douglas Remak, MD  REFERRING PROVIDER:    Pegge Toribio PARAS, PA-C    END OF SESSION:    PT End of Session - 09/19/23 1106     Visit Number 10    Number of Visits 24    Date for PT Re-Evaluation 11/12/23    PT Start Time 1105    PT Stop Time 1150    PT Time Calculation (min) 45 min    Equipment Utilized During Treatment Gait belt    Activity Tolerance Patient tolerated treatment well    Behavior During Therapy WFL for tasks assessed/performed           Past Medical History:  Diagnosis Date   Anemia    Cervical myelopathy (HCC)    Chronic venous insufficiency    [name] (chronic kidney disease), stage III (HCC)    Diabetes mellitus without complication (HCC)    GERD (gastroesophageal reflux disease)    Hypercholesteremia    Hypothyroidism    Kidney stones    about 50 in the past--last in March 4/24   Leg weakness, bilateral    due to Myasthenia Gravis   Myasthenia gravis (HCC)    Spinal stenosis    Vitamin B 12 deficiency    Vitamin B 12 deficiency    Wears hearing aid    bilateral   Past Surgical History:  Procedure Laterality Date   BACK SURGERY  09/2015   CARDIAC CATHETERIZATION     CATARACT EXTRACTION W/ INTRAOCULAR LENS IMPLANT     CATARACT EXTRACTION W/PHACO Right 12/21/2015   Procedure: CATARACT EXTRACTION PHACO AND INTRAOCULAR LENS PLACEMENT (IOC);  Surgeon: Dene Etienne, MD;  Location: [facility];  Service: Ophthalmology;  Laterality: Right;  DIABETIC - insulin  and oral meds   COLONOSCOPY     SHOULDER SURGERY     labrum repair   TONSILLECTOMY     Patient Active Problem List   Diagnosis Date Noted   High blood pressure 08/13/2023   Anemia 08/13/2023   Constipation 08/13/2023    Myasthenia gravis (HCC) 08/13/2023   Acute ischemic right MCA stroke (HCC) 07/19/2023   Lymphedema 12/27/2016   Leg pain 07/15/2016   Chronic venous insufficiency 07/15/2016   Swelling of limb 07/15/2016   Type 2 diabetes mellitus (HCC) 07/15/2016   Hyperlipidemia 07/15/2016    ONSET DATE:   REFERRING DIAG:  Diagnosis  I63.511 (ICD-10-CM) - Cerebral infarction due to unspecified occlusion or stenosis of right middle cerebral artery    THERAPY DIAG:  Muscle weakness (generalized)  Difficulty in walking, not elsewhere classified  Unsteadiness on feet  Hemiplegia and hemiparesis following cerebral infarction affecting left non-dominant side (HCC)  Other lack of coordination  Abnormality of gait and mobility  Rationale for Evaluation and Treatment: Rehabilitation  SUBJECTIVE:  SUBJECTIVE STATEMENT:  Pt reports he is doing good. Denies pain. Pt reports he is still not walking at home as was advised by therapist. Pt still using PWC in home for mod-I functional mobility and reports they purchased another PWC that is smaller and lighter-weight that is easier to maneuver in the home and is easier to transport to get out in the community.   Pt states [employer] did come to adjust speed control of his PWC, but the controls are still not as easy to maneuver as the controls on the PWC pt individually purchased.  Pt reports his goal is to learn to walk again if at all possible, even if it is only 10 feet, and he wants to focus on walking during every PT session.     From Initial Eval: Pt reports that he is doing well. Has not been moving much since discharging from [facility], as therapists instructed pt to perform transfers only at home due to high fall risk with ambulation. Continues to have significant  weakenss in the LUE with difficulty extending fingers as well as numbness and inattention to the L side of body resulting in multiple cuts on Arm and leg with WC mobility in home.  Will be attaining custom power WC from [employer]. Has loaner currently.   Pt accompanied by: significant other [name]   PERTINENT HISTORY: [name]. is a 73 y.o. male with history of T2DM, HTN, [name], renal calculi, seropositive myasthenia gravis who was admitted to [facility] on 07/14/2023 with reports 3 to 4-day history of stuttering speech and fluctuating LUE weakness followed by fall.  He reported some double vision and per discussion with his neurology I had increased prednisone  to 10 mg/day due to concerns of MG flare.  He was found to have left facial weakness with dysarthria as well as LUE and LLE weakness.  MRI brain done revealing acute right MCA infarction involving right periventricular white matter and extending into right frontal and parietal lobes.    [name]. was admitted to rehab 07/19/2023 for inpatient therapies to consist of PT, ST and OT at least three hours five days a week. Past admission physiatrist, therapy team and rehab RN have worked together to provide customized collaborative inpatient rehab.  He continues on low-dose aspirin  and subcu Lovenox  was used for DVT prophylaxis during his stay.  His blood pressures were monitored on TID basis and has been stable on current regimen.  Serial check of CBC shows H&H to be relatively stable stool guaiac was negative x 1.  He continues to have lower extremity edema and was educated on importance of elevation for edema control.  Constipation has resolved with sorbitol  use on intermittent basis.  P.o. intake has been good.  He was noted to have Candida crura's which was treated with course of Diflucan.  MASD has resolved with use of Gerhardt's Butt cream.   Left hemiplegia as been a limiting factor with left knee pain noted with increase in activity.   EKG was added for support and pain management.  His diabetes has been monitored with ac/hs CBG checks and SSI was use prn for tighter BS control.  Metformin  was resumed at 5 mg twice daily and basal insulin  was slowly titrated upwards to 26 units.  Blood sugars continue to be variable and he was advised to increase metformin  to home dose and titrate insulin  glargine slowly if blood sugars range over 150.  He has made steady gains during his stay  and continues to be limited by mild left inattention with left knee plegia and weakness. He has been evaluated for  speciality wheelchair by [employer]. He will continue to receive follow up outpatient PT, OT and ST at [facility] after discharge.   PAIN:  Are you having pain? No and reports Numbness in the L H and and Leg   PRECAUTIONS: Fall  **reports gets rash from contact with latex  RED FLAGS: Hx of MG   WEIGHT BEARING RESTRICTIONS: No  FALLS: Has patient fallen in last 6 months? Yes. Number of falls 1  LIVING ENVIRONMENT: Lives with: lives with their spouse Lives in: House/apartment Stairs: Ramps installed while in the Hospital  Has following equipment at home: Vannie - 2 wheeled and Wheelchair (power)  PLOF: Independent with basic ADLs, Independent with household mobility without device, and Independent with community mobility with device with [facility]   PATIENT GOALS: relearn to Walk 15-27ft.   OBJECTIVE:  Note: Objective measures were completed at Evaluation unless otherwise noted.  DIAGNOSTIC FINDINGS: MRI brain done revealing acute right MCA infarction involving right periventricular white matter and extending into right frontal and parietal lobes.  COGNITION: Overall cognitive status: Impaired poor awareness of L side while dual tasking.    SENSATION: Light touch: Impaired   COORDINATION: Limited ROM and strength on the LUE limiting finger to nose.  ROM and strength deficits limit ankle to knee.   EDEMA:  Mild edema in  the L UE.   MUSCLE TONE: LLE: Mild    POSTURE: flexed trunk. Lateral lean to the L   LOWER EXTREMITY ROM:     Active  Right Eval Left Eval  Hip flexion    Hip extension    Hip abduction    Hip adduction    Hip internal rotation    Hip external rotation    Knee flexion    Knee extension    Ankle dorsiflexion    Ankle plantarflexion    Ankle inversion    Ankle eversion     (Blank rows = not tested)  LOWER EXTREMITY MMT:    MMT Right Eval Left Eval  Hip flexion 4+ 4-  Hip extension 4+ 3+  Hip abduction 4+ 3+  Hip adduction 4+ 3+  Hip internal rotation    Hip external rotation    Knee flexion 5 4-  Knee extension 5 4-  Ankle dorsiflexion 5 4  Ankle plantarflexion    Ankle inversion    Ankle eversion    (Blank rows = not tested)  BED MOBILITY:  Findings: Sit to supine Mod A and Max A Supine to sit Mod A and Max A  TRANSFERS: Sit to stand: Min A and Mod A  Assistive device utilized: Environmental consultant - 2 wheeled     Stand to sit: Min A  Assistive device utilized: Environmental consultant - 2 wheeled     Chair to chair: Min A and Mod A  Assistive device utilized: Environmental consultant - 2 wheeled      PT required to stabilize the LUE on RW as well as provide max cues for attention to hand to prevent sliding off hand grip.  At home Pt utilizes hand splint for stability on RW to prevent loss of grip in transfers.   RAMP:  Not tested  CURB:  Not tested  STAIRS: Not tested GAIT: Findings: Gait Characteristics: step to pattern, decreased hip/knee flexion- Left, Left foot flat, ataxic, lateral lean- Left, wide BOS, and poor foot clearance- Left, Distance  walked: 67ft, Assistive device utilized:Walker - 2 wheeled, Level of assistance: Mod A and Max A, and Comments: poor awareness of lateral/anterior L LOB as well as poor awareness of LUE causing loos of grip on RW and heavy lateral LOB to the L, +2 assist required to prevent fall and position chair under patient as he attempted turn at 11ft.    FUNCTIONAL  TESTS:  5 times sit to stand: 1:29 min Timed up and go (TUG): unable to complete turn at 63ft.  10 meter walk test: unable to complete on this day.   PATIENT SURVEYS:  Stroke Impact Scale 49/80                                                                                                                              TREATMENT DATE: 09/19/2023  Patient and wife asking if pt is safe to perform stand pivot transfers at home from EOB<>wheelchair using RW mod-I because pt's wife reports she is no longer providing hands-on assistance for this - therapist educated them on assessing the following items to help make that determination:  Does pt require set-up assistance Does pt require verbal cuing to complete it safely Does pt demonstrate safe technique with good balance     Pt wearing L swedish knee cage for therapy. Pt's wife brought in pt's personal L hand splint to use on bari-RW during session. Pt's wife requested to use clinic's resting hand splint moving forward because donning/doffing pt's hand splint from his RW is impacting how it fits on pt's RW at home (awaiting for clinic hand splint to arrive).    Stroke Impact Scale 16 (Copyrighted instrument, [facility])  In the past 2 weeks, how difficult was it to...  Rating Scale 5 = Not difficult at all 4 = A little difficult 3 = Somewhat difficult 2 = Very difficult 1 = Could not do at all  a. Dress the top part of your body? 5  b. Bathe yourself? 3  c. Get to the toilet on time? 3  d. Control your bladder (not have an accident)? 5  e. Control your bowels (not have an accident)? 5  f. Stand without losing balance? 3  g. Go shopping? 1  h. Do heavy household chores (e.g. vacuum, laundry or  yard work)? 2  i. Stay sitting without losing your balance? 5  j. Walk without losing your balance? 1  k. Move from a bed to a chair? 4  l. Walk fast? 1  m. Climb one flight of stairs? 1  n. Walk one block? 1  o.  Get in and out of a car? 4  p. Carry heavy objects (e.g. bag of groceries) with your  affected hand? 1  Sum:  45/80 *pt becoming more aware of his deficits and how they impact his independence and safety to complete the above activities  MDC (Minimal Detectable Change) is >/=8    Five times  Sit to Stand Test (FTSS) "Stand up and sit down as quickly as possible 5 times, keeping your arms folded across your chest."    TIME: 56.39 seconds using R UE support to push-up from armrest of green chair and requiring skilled min A for balance and lifting/lowering   Times > 13.6 seconds is associated with increased disability and morbidity (Guralnik, 2000) Times > 15 seconds is predictive of recurrent falls in healthy individuals aged 28 and older (Buatois, et al., 2008) Normal performance values in community dwelling individuals aged 66 and older (Bohannon, 2006): 60-69 years: 11.4 seconds 70-79 years: 12.6 seconds 80-89 years: 14.8 seconds  MCID: >= 2.3 seconds for Vestibular Disorders (Meretta, 2006)    10 Meter Walk Test: Patient instructed to walk 10 meters (32.8 ft) as quickly and as safely as possible at their normal speed Results: 0.14 m/s ( 08 seconds & 1 min 13 sec) using bari-RW with L hand splint - improving L foot clearance until becoming fatigued, continues to have excessive L hip adduction with slight exssive hip ER, improving midline orientation with less L lateral lean Skilled min A throughout for balance and +2 w/c follow for safety 1x L hand slipped off RW hand splint requiring therapist to reposition to maintain his balance, causing his time to be slower  Cut off scores:   Household Ambulator  < 0.4 m/s  Limited Community Ambulator  0.4 - 0.8 m/s  [employer]  > 0.8 m/s  Increased fall risk  < 1.26m/s  Crossing a Street  >1.39m/s  MCID 0.05 m/s (small), 0.13 m/s (moderate), 0.06 m/s (significant)  (ANPTA Core Set of Outcome Measures for Adults with  Neurologic Conditions, 2018)    Reiterated cuing of L hand placement on/off RW hand splint more independently during sit<>stand transfers throughout session; however, pt with significant difficulty getting L hand placed on splint properly without assistance while in sitting. Educated pt on ensuring he has his feet properly positioned to maintain his balance when going to unstrap his L hand prior to sitting down.   Participated in Timed Up and Go (TUG): 23min43 seconds using bari-RW with L hand slipping off RW handle causing him to require increased time to complete the test today; however, pt only required 1 skilled assistance today! Continued to allow pt to set-up L hand on RW, scoot hips forward in seat, and assist with L foot placement prior to starting the time Due to time constraints, unable to perform another trial today Patient demonstrates high fall risk as indicated by requiring >13.5seconds to complete the TUG.     PATIENT EDUCATION: Education details: Pt educated throughout session about proper posture and technique with exercises. Improved exercise technique, movement at target joints, use of target muscles after min to mod verbal, visual, tactile cues.  Person educated: Patient and Spouse Education method: Explanation, VC/TC/Demo Education comprehension: verbalized understanding  HOME EXERCISE PROGRAM:  Access Code: [email or web address] URL: [email or web address] Date: 09/11/2023 Prepared by: Massie Dollar  Exercises - Seated Knee Extension AROM  - 1 x daily - 4 x weekly - 3 sets - 10 reps - Seated Hip Abduction  - 1 x daily - 4 x weekly - 3 sets - 10 reps - Seated March  - 1 x daily - 4 x weekly - 3 sets - 10 reps - Sit to Stand with Counter Support  - 1 x daily - 4 x weekly - 3 sets - 10 reps - Supine Short Arc Quad  - 1 x  daily - 4 x weekly - 3 sets - 10 reps - Supine Hip Abduction  - 1 x daily - 4 x weekly - 3 sets - 10 reps - Supine Bridge  - 1 x daily - 4 x  weekly - 3 sets - 3 reps - 2sec  hold  GOALS: Goals reviewed with patient? Yes  SHORT TERM GOALS: Target date: 09/25/2023    Patient will be independent in home exercise program to improve strength/mobility for better functional independence with ADLs. Baseline: Initiated on 09/11/2023 Goal status: IN PROGRESS   LONG TERM GOALS: Target date: 11/13/2023    Patient will increase SIS-16 score to equal to or greater than 10points to demonstrate statistically significant improvement in mobility and quality of life.  Baseline: 49/80 09/19/2023: 45/80 Goal status: IN PROGRESS  2.  Patient (> 71 years old) will complete five times sit to stand test in < 15 seconds indicating an increased LE strength and improved balance. Baseline: 1:25min 09/19/2023: 56.39 seconds using R UE support to push-up from armrest of green chair and requiring skilled min A for balance and lifting/lowering  Goal status: IN PROGRESS  3.  Patient will increase FIST score by > 6 points to demonstrate decreased fall risk during functional activities Baseline:  43/56  09/19/2023: need to assess and upgrade goal if appropriate Goal status: INITIAL  4.  Patient will increase 10 meter walk test to >0.85m/s as to improve gait speed for better community ambulation and to reduce fall risk. Baseline: 09/09/2023: 0.103 m/s ( and 37 seconds) using bari-RW with skilled heavy min A and +2 w/c follow for safety  09/19/2023: 0.66m/s using bari-RW with skilled min A of 1 and +2 w/c follow for safety Goal status: IN PROGRESS  5.  Patient will reduce timed up and go to <11 seconds to reduce fall risk and demonstrate improved transfer/gait ability. Baseline: unable to complete turn at 10 ft. Mod-max assist due to poor control of LW on RW, resulting in L lateral LOB  09/09/2023: and 32 seconds using bari-RW with L hand splint and +2 on R side for safety, therapist providing skilled min A on L side (Had pt set-up L hand on RW splint,  get L foot in proper positioning, and scoot forward in seat before starting the timer) 09/19/2023: 30min43 seconds using bari-RW with only skilled min A of 1 (Had pt set-up L hand on RW splint, get L foot in proper positioning, and scoot forward in seat before starting the timer) Goal status: IN PROGRESS   ASSESSMENT:  CLINICAL IMPRESSION:  Patient is a 73 y.o. male who was seen today for physical therapy treatment for balance, coordination, and gait deficits following CVA in April. Therapy session focused on re-assessment of standardized outcome measures and subjective questionnaire to determine pt's progress with therapy thus far. Patient demonstrates slight decrease on SIS-16 subjective questionnaire; however, believe that may be due to patient having increasing awareness of his deficits and their impact on his functional mobility and independence. Patient demonstrates significant improvement on 5xSTS; however, does still require use of R UE support and skilled min A to complete the test safely. Patient also demonstrates significant improvement on demonstrating improving gait speed. Patient continues to remain highly motivated to regain his ability to ambulate. Pt will continue to benefit from skilled physical therapy interventions to address above impairments, improve QOL, and attain therapy goals. Patient's condition has the potential to improve in response to therapy. Maximum improvement is yet to  be obtained. The anticipated improvement is attainable and reasonable in a generally predictable time.     OBJECTIVE IMPAIRMENTS: Abnormal gait, cardiopulmonary status limiting activity, decreased activity tolerance, decreased balance, decreased cognition, decreased coordination, decreased endurance, decreased knowledge of condition, decreased knowledge of use of DME, decreased mobility, difficulty walking, decreased ROM, decreased strength, decreased safety awareness, dizziness, hypomobility,  increased fascial restrictions, impaired perceived functional ability, increased muscle spasms, impaired sensation, impaired tone, impaired UE functional use, impaired vision/preception, improper body mechanics, postural dysfunction, and obesity.   ACTIVITY LIMITATIONS: carrying, lifting, bending, sitting, standing, squatting, stairs, transfers, bed mobility, bathing, toileting, dressing, reach over head, hygiene/grooming, locomotion level, and caring for others  PARTICIPATION LIMITATIONS: meal prep, cleaning, laundry, medication management, interpersonal relationship, driving, shopping, community activity, and yard work  PERSONAL FACTORS: Age, Past/current experiences, Time since onset of injury/illness/exacerbation, and 3+ comorbidities: MG, HTN, CVA, lymphedema are also affecting patient's functional outcome.   REHAB POTENTIAL: Good  CLINICAL DECISION MAKING: Unstable/unpredictable  EVALUATION COMPLEXITY: High  PLAN:  PT FREQUENCY: 1-2x/week  PT DURATION: 12 weeks  PLANNED INTERVENTIONS: 97164- PT Re-evaluation, 97750- Physical Performance Testing, 97110-Therapeutic exercises, 97530- Therapeutic activity, [id number]- Neuromuscular re-education, 97535- Self Care, [id number]- Manual therapy, [id number]- Gait training, [id number]- Orthotic Initial, [id number]- Orthotic/Prosthetic subsequent, [phone]- Canalith repositioning, H9716- Electrical stimulation (unattended), [id number]- Electrical stimulation (manual), [id number]- Wound care (first 20 sq cm), 97598- Wound care (each additional 20 sq cm), Patient/Family education, Balance training, Stair training, Taping, Dry Needling, Joint mobilization, Joint manipulation, Vestibular training, Visual/preceptual remediation/compensation, Cognitive remediation, DME instructions, Wheelchair mobility training, Cryotherapy, and Moist heat  PLAN FOR NEXT SESSION:  Complete FIST and upgrade goal to the Berg if appropriate Perform another trial of TUG? Review HEP as time allows  Gait  training with L hand splint on bari-RW  Continue progression of turning through doorways and turning to sit in chair at end of gait Continue reinforcing pt management of L hand placement on/off hand splint during transfers to hopefully reach mod-I stand pivot transfer level at home prior to reaching ambulatory level Work on midline orientation and R weight shifting Work on L LE coordination and strength for improved swing phase advancement    Evalyne Cortopassi, PT, DPT, NCS, CSRS Physical Therapist - [place]  [facility]  11:51 AM 09/19/23

## 2023-09-19 NOTE — Therapy (Signed)
 Occupational Therapy Progress Note  Dates of reporting period  08/20/23   to   09/19/23   Patient Name: [name]. MRN: [id number] DOB:1950/06/18, 73 y.o., male Today's Date: 09/19/2023  PCP: Alm Rower, MD REFERRING PROVIDER: Hermann Toribio PARAS, PA-C  END OF SESSION:  OT End of Session - 09/19/23 2244     Visit Number 10    Number of Visits 24    Date for OT Re-Evaluation 11/12/23    OT Start Time 1145    OT Stop Time 1230    OT Time Calculation (min) 45 min    Activity Tolerance Patient tolerated treatment well    Behavior During Therapy WFL for tasks assessed/performed          Past Medical History:  Diagnosis Date   Anemia    Cervical myelopathy (HCC)    Chronic venous insufficiency    [name] (chronic kidney disease), stage III (HCC)    Diabetes mellitus without complication (HCC)    GERD (gastroesophageal reflux disease)    Hypercholesteremia    Hypothyroidism    Kidney stones    about 50 in the past--last in March 4/24   Leg weakness, bilateral    due to Myasthenia Gravis   Myasthenia gravis (HCC)    Spinal stenosis    Vitamin B 12 deficiency    Vitamin B 12 deficiency    Wears hearing aid    bilateral   Past Surgical History:  Procedure Laterality Date   BACK SURGERY  09/2015   CARDIAC CATHETERIZATION     CATARACT EXTRACTION W/ INTRAOCULAR LENS IMPLANT     CATARACT EXTRACTION W/PHACO Right 12/21/2015   Procedure: CATARACT EXTRACTION PHACO AND INTRAOCULAR LENS PLACEMENT (IOC);  Surgeon: Dene Etienne, MD;  Location: [facility];  Service: Ophthalmology;  Laterality: Right;  DIABETIC - insulin  and oral meds   COLONOSCOPY     SHOULDER SURGERY     labrum repair   TONSILLECTOMY     Patient Active Problem List   Diagnosis Date Noted   High blood pressure 08/13/2023   Anemia 08/13/2023   Constipation 08/13/2023   Myasthenia gravis (HCC) 08/13/2023   Acute ischemic right MCA stroke (HCC) 07/19/2023   Lymphedema 12/27/2016    Leg pain 07/15/2016   Chronic venous insufficiency 07/15/2016   Swelling of limb 07/15/2016   Type 2 diabetes mellitus (HCC) 07/15/2016   Hyperlipidemia 07/15/2016   ONSET DATE: 07/13/2023  REFERRING DIAG: Acute ischemic R MCA CVA  THERAPY DIAG: muscle weakness (generalized), other lack of coordination, vision disturbance, hemiplegia and hemiparesis following cerebral infarction affecting left non-dominant side (HCC)  Rationale for Evaluation and Treatment: Rehabilitation  SUBJECTIVE:  SUBJECTIVE STATEMENT:  Pt reports attending church this past weekend. Pt accompanied by: significant other  PERTINENT HISTORY: Pt. Was admitted to [facility] on 07/13/2023 after sustaining a CVA while on a trip to the beach. Pt. Was diagnosed with R MCA CVA. Pt. Was admitted to inpatient rehab from 07/19/2023-08/13/2023. Past medical history includes high BP, anemia, and Myasthenia Gravis.   PRECAUTIONS: None  WEIGHT BEARING RESTRICTIONS: No  PAIN:  Are you having pain?0/10  FALLS: Has patient fallen in last 6 months? Yes  LIVING ENVIRONMENT: Lives with: lives with their family and lives with their spouse-  Lives in: House/apartment- 2 story home and resides mostly on the 1st floor Stairs: No- Ramped entrance Has following equipment at home:  Vannie, cane, Steadi, shower chair, handheld shower head, bedside commode  PLOF: Independent and Independent with basic  ADLs Independent at home   PATIENT GOALS: Would like to walk 10-20 feet each time.   OBJECTIVE:  Note: Objective measures were completed at Evaluation unless otherwise noted.  HAND DOMINANCE: Right  ADLs: Overall ADLs:  Transfers/ambulation related to ADLs: Eating: Independent, 100% R handed Grooming: independent UB Dressing: Can put on shirt independently LB Dressing: Difficulty with pants and requires assistance getting the pants on his feet, has difficulty putting on shoes and socks Toileting: Tot A for toilet hygiene. Uses Steadi  during toilet transfers. Bathing: Requires assist with using the L arm to wash the R arm. Tub Shower transfers: Roll in shower, built in shower chair, grab bars and hand held shower head.  IADLs: Shopping: Total A (Wife typically did the shopping prior to onset)   Light housekeeping: Total A (wife typically performs prior to onset) Meal Prep: Independent with light snack prep Community mobility: No driving, able to get in/out of the car Medication management: Set up assistance from his wife using a pill box (pt. Is responsible for taking medications at the correct time) Financial management: family members check behind him. Handwriting: TBD Hobbies: gardening, wood working and sports- [employer] Work history: Owns a tax business MOBILITY STATUS: Needs Assist:    POSTURE COMMENTS:   Sitting balance: Good supported sitting balance  FUNCTIONAL OUTCOME MEASURES: TBD   UPPER EXTREMITY ROM:    Active ROM Right Eval  WFL Left eval  Shoulder flexion  89(110)  Shoulder abduction  98(120)  Shoulder adduction    Shoulder extension    Shoulder internal rotation    Shoulder external rotation    Elbow flexion  120(140)  Elbow extension  -28(-10)  Wrist flexion  60(60) Rest at 60  Wrist extension  -48(42)  Wrist ulnar deviation    Wrist radial deviation    Wrist pronation    Wrist supination    (Blank rows = not tested)  L Digit flexion: 2nd:3cm (0cm) 3rd: 0cm (0cm), 4th: 0cm (0cm), 5th: 2cm (0cm)  L Digit extension: Active Gross digit extension through 10% of ROM UPPER EXTREMITY MMT:     MMT Right eval Left eval  Shoulder flexion 5 3-  Shoulder abduction 5 3-  Shoulder adduction    Shoulder extension    Shoulder internal rotation    Shoulder external rotation    Middle trapezius    Lower trapezius    Elbow flexion 5 TBD  Elbow extension 5 TBD  Wrist flexion    Wrist extension  2-  Wrist ulnar deviation    Wrist radial deviation    Wrist pronation    Wrist supination     (Blank rows = not tested)  HAND FUNCTION:   COORDINATION:    SENSATION: Sensation feels different in both hands.  L hand Light touch- impaired L hand Proprioception- impaired  EDEMA: Has had hx of edema, and came to Eval session with swelling in the L hand/wrist/forearm.   *pt. Has a laceration from his dog on the dorsal aspect of the L hand.   MUSCLE TONE: Increased tone through the UE and hand flexors.   COGNITION: Overall cognitive status: Within functional limits for tasks assessed  VISION: Subjective report: L sided inattention Baseline vision: Wears glasses for reading only Visual history: Right visual occlusion  VISION ASSESSMENT: To be assessed  PERCEPTION: TBD  PRAXIS: Impaired  TREATMENT DATE: 09/19/2023  Manual Therapy:    -Pt. tolerated retrograde massage to the Left hand for edema control 2/2 increased edema in the thenar eminence.  -STM was performed to the volar aspect of the left forearm 2/2 pain with wrist, and digit extension. -Manual therapy was performed independent of, and in preparation for therapeutic Ex.     Therapeutic Exercise:   -AAROM/AROM stretching performed throughout the LUE, including L shoulder through all planes within pain free ranges, elbow flex/ext, forearm pron/sup, wrist flex/ext, and digit flex/ext.    Therapeutic Activities:  -Pt. tolerated reps of left wrist extension with facilitory tapping to the wrist extensors in the dorsal forearm. Support was provided proximally for elbow extension. -Facilitated grasping 1/2 cubes,  sustaining the grasp during wrist extension, then reaching out into elbow extension to release them onto a container with facilitory tapping to the dorsal forearm at the wrist, and digit extensors.  PATIENT EDUCATION: Education details:  LUE reaching/grasp/release activities Person educated:  Patient and Spouse Education method: Explanation, Demonstration, Tactile cues, and Verbal cues Education comprehension: verbalized understanding, returned demonstration, and needs further education  HOME EXERCISE PROGRAM: Self passive stretching throughout the LUE; grasp/release activities   GOALS: Goals reviewed with patient? Yes  SHORT TERM GOALS: Target date: 10/01/2023   Pt. Will be independent with HEP for the LUE.  Baseline: 09/19/23: Requires assist from his wife Eval; no current HEP Goal status: Ongoing   LONG TERM GOALS: Target date: 11/12/2023  Pt. Will increase L shoulder flexion by 10 degrees to be able to reach up to shelves.  Baseline: 09/19/23: Pt. Continues to be limited with reaching up to shelves. Eval: L shoulder flexion is 89 (110). Goal status: Ongoing  2.  Pt. Will increase L shoulder abduction by 10 degrees to assist with underarm hygiene.  Baseline: 09/19/23: Pt. Continues to require assist with underarm hygiene.Eval: L shoulder abduction is 98 (120).  Goal status: Ongoing  3.  Pt. Will improve L wrist extension by 10 degrees to be able to initiate anticipation of grasping for objects.  Baseline: 09/19/23: Consistent activation noted in the left wrist extensors with facilitation. Eval: -48 (42) Goal status: Ongoing  4.  Pt. Will improve active gross digit extension through 50% of the range to be able to release objects in his hand consistently.  Baseline: 09/19/23: gross digit extension noted through 25% range with facilitation.Eval: gross digit extension is 10% of the range  Goal status: Goal updated to 50% of the range  5.  Pt. Will independently demonstrate visual compensatory strategies when navigating through environment and during tabletop tasks. Baseline: 09/19/23: Pt. Requires fewer cues for left sided awareness Eval: Pt. Requires consistent cuing for L sided awareness.  Goal status: Ongoing  ASSESSMENT:  CLINICAL IMPRESSION:  Pt. overall edema is  improving, however continues to present with edema in the thenar eminence. Pt. requires fewer cues for left sided awareness, and to engage the LUE during tasks. Pt. Is improving with left wrist extension, and gross digit extension with facilitation. The goal for gross digit extension has been upgraded to 50% of the range to be able to consistently  release objects from the hand. Pt. continues to be limited by pain in the volar forearm with digit extension, and weakness. Pt continues to benefit from OT services to work on improving LUE functioning to increase engagement of the LUE during daily task, and to provide education about compensatory strategies during ADLs/IADLs.  PERFORMANCE DEFICITS: in functional skills including ADLs, IADLs,  coordination, dexterity, proprioception, sensation, edema, tone, ROM, strength, pain, Fine motor control, Gross motor control, mobility, balance, endurance, vision, and UE functional use, cognitive skills including perception, and psychosocial skills including coping strategies, environmental adaptation, and routines and behaviors.   IMPAIRMENTS: are limiting patient from ADLs, IADLs, and leisure.   CO-MORBIDITIES: may have co-morbidities  that affects occupational performance. Patient will benefit from skilled OT to address above impairments and improve overall function.  MODIFICATION OR ASSISTANCE TO COMPLETE EVALUATION: Min-Moderate modification of tasks or assist with assess necessary to complete an evaluation.  OT OCCUPATIONAL PROFILE AND HISTORY: Detailed assessment: Review of records and additional review of physical, cognitive, psychosocial history related to current functional performance.  CLINICAL DECISION MAKING: Moderate - several treatment options, min-mod task modification necessary  REHAB POTENTIAL: Good  EVALUATION COMPLEXITY: Moderate    PLAN:  OT FREQUENCY: 2x/week  OT DURATION: 12 weeks  PLANNED INTERVENTIONS: 97168 OT Re-evaluation,  97535 self care/ADL training, 02889 therapeutic exercise, 97530 therapeutic activity, 97112 neuromuscular re-education, 97140 manual therapy, 97018 paraffin, 02989 moist heat, 97010 cryotherapy, 97034 contrast bath, 97760 Orthotic Initial, 97763 Orthotic/Prosthetic subsequent, passive range of motion, visual/perceptual remediation/compensation, energy conservation, and DME and/or AE instructions  RECOMMENDED OTHER SERVICES: PT and ST  CONSULTED AND AGREED WITH PLAN OF CARE: Patient and family member/caregiver  PLAN FOR NEXT SESSION: see above  Richardson Otter, MS, OTR/L

## 2023-09-23 ENCOUNTER — Ambulatory Visit: Admitting: Speech Pathology

## 2023-09-23 ENCOUNTER — Ambulatory Visit: Admitting: Physical Therapy

## 2023-09-23 ENCOUNTER — Ambulatory Visit: Admitting: Occupational Therapy

## 2023-09-23 DIAGNOSIS — M6281 Muscle weakness (generalized): Secondary | ICD-10-CM | POA: Diagnosis not present

## 2023-09-23 DIAGNOSIS — I69354 Hemiplegia and hemiparesis following cerebral infarction affecting left non-dominant side: Secondary | ICD-10-CM

## 2023-09-23 DIAGNOSIS — R278 Other lack of coordination: Secondary | ICD-10-CM

## 2023-09-23 DIAGNOSIS — R262 Difficulty in walking, not elsewhere classified: Secondary | ICD-10-CM

## 2023-09-23 DIAGNOSIS — R269 Unspecified abnormalities of gait and mobility: Secondary | ICD-10-CM

## 2023-09-23 DIAGNOSIS — R41841 Cognitive communication deficit: Secondary | ICD-10-CM

## 2023-09-23 DIAGNOSIS — R2681 Unsteadiness on feet: Secondary | ICD-10-CM

## 2023-09-23 NOTE — Therapy (Addendum)
 Occupational Therapy NEURO TREATMENT NOTE  Patient Name: [name]. MRN: [id number] DOB:Dec 13, 1950, 73 y.o., male Today's Date: 09/23/2023  PCP: Alm Rower, MD REFERRING PROVIDER: Hermann Toribio PARAS, PA-C  END OF SESSION:  OT End of Session - 09/23/23 1539     Visit Number 11    Number of Visits 24    Date for OT Re-Evaluation 11/12/23    OT Start Time 1100    OT Stop Time 1145    OT Time Calculation (min) 45 min    Activity Tolerance Patient tolerated treatment well    Behavior During Therapy WFL for tasks assessed/performed           Past Medical History:  Diagnosis Date   Anemia    Cervical myelopathy (HCC)    Chronic venous insufficiency    [name] (chronic kidney disease), stage III (HCC)    Diabetes mellitus without complication (HCC)    GERD (gastroesophageal reflux disease)    Hypercholesteremia    Hypothyroidism    Kidney stones    about 50 in the past--last in March 4/24   Leg weakness, bilateral    due to Myasthenia Gravis   Myasthenia gravis (HCC)    Spinal stenosis    Vitamin B 12 deficiency    Vitamin B 12 deficiency    Wears hearing aid    bilateral   Past Surgical History:  Procedure Laterality Date   BACK SURGERY  09/2015   CARDIAC CATHETERIZATION     CATARACT EXTRACTION W/ INTRAOCULAR LENS IMPLANT     CATARACT EXTRACTION W/PHACO Right 12/21/2015   Procedure: CATARACT EXTRACTION PHACO AND INTRAOCULAR LENS PLACEMENT (IOC);  Surgeon: Dene Etienne, MD;  Location: [facility];  Service: Ophthalmology;  Laterality: Right;  DIABETIC - insulin  and oral meds   COLONOSCOPY     SHOULDER SURGERY     labrum repair   TONSILLECTOMY     Patient Active Problem List   Diagnosis Date Noted   High blood pressure 08/13/2023   Anemia 08/13/2023   Constipation 08/13/2023   Myasthenia gravis (HCC) 08/13/2023   Acute ischemic right MCA stroke (HCC) 07/19/2023   Lymphedema 12/27/2016   Leg pain 07/15/2016   Chronic venous  insufficiency 07/15/2016   Swelling of limb 07/15/2016   Type 2 diabetes mellitus (HCC) 07/15/2016   Hyperlipidemia 07/15/2016   ONSET DATE: 07/13/2023  REFERRING DIAG: Acute ischemic R MCA CVA  THERAPY DIAG: muscle weakness (generalized), other lack of coordination, vision disturbance, hemiplegia and hemiparesis following cerebral infarction affecting left non-dominant side (HCC)  Rationale for Evaluation and Treatment: Rehabilitation  SUBJECTIVE:  SUBJECTIVE STATEMENT:  Pt reports having 8/10 pain using his walker splint.  Pt accompanied by: significant other  PERTINENT HISTORY: Pt. Was admitted to [facility] on 07/13/2023 after sustaining a CVA while on a trip to the beach. Pt. Was diagnosed with R MCA CVA. Pt. Was admitted to inpatient rehab from 07/19/2023-08/13/2023. Past medical history includes high BP, anemia, and Myasthenia Gravis.   PRECAUTIONS: None  WEIGHT BEARING RESTRICTIONS: No  PAIN:  Are you having pain?0/10  FALLS: Has patient fallen in last 6 months? Yes  LIVING ENVIRONMENT: Lives with: lives with their family and lives with their spouse-  Lives in: House/apartment- 2 story home and resides mostly on the 1st floor Stairs: No- Ramped entrance Has following equipment at home:  Vannie, cane, Steadi, shower chair, handheld shower head, bedside commode  PLOF: Independent and Independent with basic ADLs Independent at home   PATIENT GOALS: Would  like to walk 10-20 feet each time.   OBJECTIVE:  Note: Objective measures were completed at Evaluation unless otherwise noted.  HAND DOMINANCE: Right  ADLs: Overall ADLs:  Transfers/ambulation related to ADLs: Eating: Independent, 100% R handed Grooming: independent UB Dressing: Can put on shirt independently LB Dressing: Difficulty with pants and requires assistance getting the pants on his feet, has difficulty putting on shoes and socks Toileting: Tot A for toilet hygiene. Uses Steadi during toilet  transfers. Bathing: Requires assist with using the L arm to wash the R arm. Tub Shower transfers: Roll in shower, built in shower chair, grab bars and hand held shower head.  IADLs: Shopping: Total A (Wife typically did the shopping prior to onset)   Light housekeeping: Total A (wife typically performs prior to onset) Meal Prep: Independent with light snack prep Community mobility: No driving, able to get in/out of the car Medication management: Set up assistance from his wife using a pill box (pt. Is responsible for taking medications at the correct time) Financial management: family members check behind him. Handwriting: TBD Hobbies: gardening, wood working and sports- [employer] Work history: Owns a tax business MOBILITY STATUS: Needs Assist:    POSTURE COMMENTS:   Sitting balance: Good supported sitting balance  FUNCTIONAL OUTCOME MEASURES: TBD   UPPER EXTREMITY ROM:    Active ROM Right Eval  [facility] Right 09/23/2023 [facility] Left eval Left 09/23/2023  Shoulder flexion   89(110) 109(114)  Shoulder abduction   98(120) 109(120)  Shoulder adduction      Shoulder extension      Shoulder internal rotation      Shoulder external rotation      Elbow flexion   120(140) 130(136)  Elbow extension   -28(-10) -5(0)  Wrist flexion   60(60) Rest at 60 50(65)  Wrist extension   -48(42) 10(40)  Wrist ulnar deviation      Wrist radial deviation      Wrist pronation      Wrist supination      (Blank rows = not tested)  Eval: L Digit flexion: 2nd:3cm (0cm) 3rd: 0cm (0cm), 4th: 0cm (0cm), 5th: 2cm (0cm)  09/23/23:  L Digit flexion to [facility]: 2nd: 3.5cm (0cm), 3rd: 4 cm(0cm), 4th 3.5cm (0cm), 5th: 2cm (0cm)  Eval:  L Digit extension: Active Gross digit extension through 10% of ROM  09/23/23:  L Digit extension: Active Gross digit extension through 50% of ROM   UPPER EXTREMITY MMT:     MMT Right eval Left eval Left 09/23/2023  Shoulder flexion 5 3- 3-/5  Shoulder abduction 5 3-  3-/5  Shoulder adduction     Shoulder extension     Shoulder internal rotation     Shoulder external rotation     Middle trapezius     Lower trapezius     Elbow flexion 5 TBD 3+/5  Elbow extension 5 TBD 4-/5  Wrist flexion   2/5  Wrist extension  2- 2/5  Wrist ulnar deviation     Wrist radial deviation     Wrist pronation     Wrist supination     (Blank rows = not tested)  HAND FUNCTION:   Grip strength:   Right: 94#, Left: 1#,   Lateral Key Pinch strength:   Right: 14#, Left: 3#  COORDINATION:  Box and Blocks: Right: 34 blocks completed in 1 min., Left: 0 blocks completed in 1 min.    SENSATION: Sensation feels different in both hands.  L hand Light touch- impaired  L hand Proprioception- impaired  EDEMA: Has had hx of edema, and came to Eval session with swelling in the L hand/wrist/forearm.   *pt. Has a laceration from his dog on the dorsal aspect of the L hand.   MUSCLE TONE: Increased tone through the UE and hand flexors.   COGNITION: Overall cognitive status: Within functional limits for tasks assessed  VISION: Subjective report: L sided inattention Baseline vision: Wears glasses for reading only Visual history: Right visual occlusion  VISION ASSESSMENT: To be assessed  PERCEPTION: TBD  PRAXIS: Impaired                                                                                                                 TREATMENT DATE: 09/23/2023  Measurements were obtained.   PATIENT EDUCATION: Education details:  LUE reaching/grasp/release activities Person educated: Patient and Spouse Education method: Explanation, Demonstration, Tactile cues, and Verbal cues Education comprehension: verbalized understanding, returned demonstration, and needs further education  HOME EXERCISE PROGRAM: Self passive stretching throughout the LUE; grasp/release activities   GOALS: Goals reviewed with patient? Yes  SHORT TERM GOALS: Target date: 10/01/2023   Pt.  Will be independent with HEP for the LUE.  Baseline: 09/19/23: Requires assist from his wife Eval; no current HEP Goal status: Ongoing   LONG TERM GOALS: Target date: 11/12/2023  Pt. Will increase L shoulder flexion by 10 degrees to be able to reach up to shelves.  Baseline: 09/19/23: Pt. Continues to be limited with reaching up to shelves. Eval: L shoulder flexion is 89 (110). Goal status: Ongoing  2.  Pt. Will increase L shoulder abduction by 10 degrees to assist with underarm hygiene.  Baseline: 09/19/23: Pt. Continues to require assist with underarm hygiene.Eval: L shoulder abduction is 98 (120).  Goal status: Ongoing  3.  Pt. Will improve L wrist extension by 10 degrees to be able to initiate anticipation of grasping for objects.  Baseline: 09/19/23: Consistent activation noted in the left wrist extensors with facilitation. Eval: -48 (42) Goal status: Ongoing  4.  Pt. Will improve active gross digit extension through 50% of the range to be able to release objects in his hand consistently.  Baseline: 09/19/23: gross digit extension noted through 25% range with facilitation.Eval: gross digit extension is 10% of the range  Goal status: Goal updated to 50% of the range  5.  Pt. Will independently demonstrate visual compensatory strategies when navigating through environment and during tabletop tasks. Baseline: 09/19/23: Pt. Requires fewer cues for left sided awareness Eval: Pt. Requires consistent cuing for L sided awareness.  Goal status: Ongoing  ASSESSMENT:  CLINICAL IMPRESSION:  Pt. tolerated treatment well today. Pt. reports 0/10 pain, however, reports 8/10 pain while using walker splint. Pt. overall edema is improving, however continues to present with edema in the thenar eminence and volar aspect of the L hand. Measurements were obtained for left AROM/PROM, resulting with improvements in shoulder flexion, shoulder abduction, elbow flexion/extension, wrist flexion/extension. Pt.  continues to have difficulty grasping/releasing items in Left hand due  to limited ROM in wrist/digit flexion/extension. Pt. presents with decreased Left grip and pinch strength due to LUE weakness. Pt. continues to be limited by pain in the volar forearm with digit extension, and weakness. Pt continues to benefit from OT services to work on improving LUE functioning to increase engagement of the LUE during daily task, and to provide education about compensatory strategies during ADLs/IADLs.  PERFORMANCE DEFICITS: in functional skills including ADLs, IADLs, coordination, dexterity, proprioception, sensation, edema, tone, ROM, strength, pain, Fine motor control, Gross motor control, mobility, balance, endurance, vision, and UE functional use, cognitive skills including perception, and psychosocial skills including coping strategies, environmental adaptation, and routines and behaviors.   IMPAIRMENTS: are limiting patient from ADLs, IADLs, and leisure.   CO-MORBIDITIES: may have co-morbidities  that affects occupational performance. Patient will benefit from skilled OT to address above impairments and improve overall function.  MODIFICATION OR ASSISTANCE TO COMPLETE EVALUATION: Min-Moderate modification of tasks or assist with assess necessary to complete an evaluation.  OT OCCUPATIONAL PROFILE AND HISTORY: Detailed assessment: Review of records and additional review of physical, cognitive, psychosocial history related to current functional performance.  CLINICAL DECISION MAKING: Moderate - several treatment options, min-mod task modification necessary  REHAB POTENTIAL: Good  EVALUATION COMPLEXITY: Moderate    PLAN:  OT FREQUENCY: 2x/week  OT DURATION: 12 weeks  PLANNED INTERVENTIONS: 97168 OT Re-evaluation, 97535 self care/ADL training, 02889 therapeutic exercise, 97530 therapeutic activity, 97112 neuromuscular re-education, 97140 manual therapy, 97018 paraffin, 02989 moist heat, 97010  cryotherapy, 97034 contrast bath, 97760 Orthotic Initial, 97763 Orthotic/Prosthetic subsequent, passive range of motion, visual/perceptual remediation/compensation, energy conservation, and DME and/or AE instructions  RECOMMENDED OTHER SERVICES: PT and ST  CONSULTED AND AGREED WITH PLAN OF CARE: Patient and family member/caregiver  PLAN FOR NEXT SESSION: see above  Damien Nap, OTS   This entire session was performed under direct supervision and direction of a licensed therapist/therapist assistant . I have personally read, edited and approve of the note as written.  Elaine Jagentenfl, MS, OTR/L   09/23/2023

## 2023-09-23 NOTE — Therapy (Signed)
 OUTPATIENT SPEECH LANGUAGE PATHOLOGY  COGNITION TREATMENT NOTE   Patient Name: [name]. MRN: [id number] DOB:1950/08/12, 73 y.o., male Today's Date: 09/23/2023  PCP: Rolin Stank, MD REFERRING PROVIDER: Toribio pitch, PA-C   End of Session - 09/23/23 1145     Visit Number 9    Number of Visits 25    Date for SLP Re-Evaluation 11/12/23    Authorization Type [employer]    Authorization Time Period 08/20/2023 thru 10/15/2023    Authorization - Visit Number 8    Authorization - Number of Visits 16    Progress Note Due on Visit 10    SLP Start Time 1145    SLP Stop Time  1230    SLP Time Calculation (min) 45 min    Activity Tolerance Patient tolerated treatment well          Past Medical History:  Diagnosis Date   Anemia    Cervical myelopathy (HCC)    Chronic venous insufficiency    [name] (chronic kidney disease), stage III (HCC)    Diabetes mellitus without complication (HCC)    GERD (gastroesophageal reflux disease)    Hypercholesteremia    Hypothyroidism    Kidney stones    about 50 in the past--last in March 4/24   Leg weakness, bilateral    due to Myasthenia Gravis   Myasthenia gravis (HCC)    Spinal stenosis    Vitamin B 12 deficiency    Vitamin B 12 deficiency    Wears hearing aid    bilateral   Past Surgical History:  Procedure Laterality Date   BACK SURGERY  09/2015   CARDIAC CATHETERIZATION     CATARACT EXTRACTION W/ INTRAOCULAR LENS IMPLANT     CATARACT EXTRACTION W/PHACO Right 12/21/2015   Procedure: CATARACT EXTRACTION PHACO AND INTRAOCULAR LENS PLACEMENT (IOC);  Surgeon: Dene Etienne, MD;  Location: [facility];  Service: Ophthalmology;  Laterality: Right;  DIABETIC - insulin  and oral meds   COLONOSCOPY     SHOULDER SURGERY     labrum repair   TONSILLECTOMY     Patient Active Problem List   Diagnosis Date Noted   High blood pressure 08/13/2023   Anemia 08/13/2023   Constipation 08/13/2023    Myasthenia gravis (HCC) 08/13/2023   Acute ischemic right MCA stroke (HCC) 07/19/2023   Lymphedema 12/27/2016   Leg pain 07/15/2016   Chronic venous insufficiency 07/15/2016   Swelling of limb 07/15/2016   Type 2 diabetes mellitus (HCC) 07/15/2016   Hyperlipidemia 07/15/2016    ONSET DATE: 07/19/2023   REFERRING DIAG: P36.488 (ICD-10-CM) - Cerebral infarction due to unspecified occlusion or stenosis of right middle cerebral artery  THERAPY DIAG:  Cognitive communication deficit  Rationale for Evaluation and Treatment Rehabilitation  SUBJECTIVE:   PERTINENT HISTORY and DIAGNOSTIC FINDINGS:   [name] is a 73 y.o. 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  CT Brain w/o contrast: 07/14/2023 New hypodensity within the right periventricular white matter extending into the inferior frontal lobe/operculum compatible with a recent infarct.   MRI Brain w/o contrast: 07/15/2023 IMPRESSION:  Acute right MCA distribution infarct involving the right periventricular white matter extending into the right frontal and parietal lobes and insula , some of which correlates to the recent CT finding. No significant associated mass effect or associated hemorrhage.   MRA head angiogram 07/16/2023  IMPRESSION:  Focal occlusion of the right inferior division distal M2 MCA (6:123) in keeping with known right MCA infarct when compared to prior MRI brain 07/15/2023.   CT head 07/17/2023 IMPRESSION:  Expansion of territory involved in the right MCA infarct compared with MRI of 07/15/2023. Increasing local mass effect with new leftward midline shift of 3 mm. No hemorrhage.   Speech Language Evaluation at [facility] 07/19/2023 Patient presents with cognitive deficits in the areas of problem solving and left  inattention as evidenced throughout patient/family interview and performance on Cognistat evaluation. Patient further demonstrates impairments in the areas of intellectual, anticipatory, and emergent awareness of deficits/mistakes.   Speech Language Evaluation at [facility] 07/31/2023 Skilled therapy session focused on re-evaluation of cognitive skills utilizing the CLQT standardized assessment. Patient scored with overall mild cognitive deficits in the subtests of attention, executive functioning and visual spatial skills. Patient scored WFL on the clock drawing task, language and memory.   PAIN:  Are you having pain? No   FALLS: Has patient fallen in last 6 months?  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 SLP facilitated session by providing the following interventions:  Pt and his wife (confirms) that pt is demonstrating improved recall of information during functional tasks   Constant Therapy Clinician was utilized: Alternating symbols: Level 4 - supervision A along with appropriate amount of time to achieve 90% accuracy - good improvement over previous session in problem solving, working memory, along with improved speed Follow Directions that you hear - level 2 - 97% Remember pictures in order (N-back) - level 1 - 100%; Level 2 86%  PATIENT EDUCATION: Education details: see above Person educated: Patient and Spouse Education  method: Explanation Education comprehension: needs further education   HOME EXERCISE PROGRAM:   See above for details    GOALS:  Goals reviewed with patient? Yes  SHORT TERM GOALS: Target date: 10 sessions  The patient will complete a simple word search puzzle (66 or smaller) within 10 minutes given intermittent moderate verbal cues. Baseline: Goal status: INITIAL  2.  The patient will recall sentence-level information after a 5-minute delay using the spaced retrieval technique. Baseline:  Goal status: INITIAL  3.  With moderate cues, pt will identify 3 cognitive strengths/weaknesses during tasks.  Baseline:  Goal status: INITIAL  LONG TERM GOALS: Target date: 11/12/2023  The patient will read sentence-level information aloud at 80% accuracy given intermittent minimal verbal cues in order to increase ability to safely live independently. Baseline:  Goal status: INITIAL  2.   With Min A, patient will use external aid for functional recall of completed/upcoming activities 75% accuracy.     Baseline:  Goal status: INITIAL  3.   With Min A, patient will complete semi-complex visual memory tasks with 75% accuracy independently.   Baseline:  Goal status: INITIAL   ASSESSMENT:  CLINICAL IMPRESSION: Patient is a 73 y.o. right handed male who was seen today for a cognitive communication treatment d/t right MCA CVA. Pt presents with overall  moderate cognitive impairment with severe deficits in delayed memory, severe deficits in visuospatial/constructional abilities (apart from left inattention), decreased awareness of deficits/errors and mild deficits in higher level attention. Immediate memory, language and selective attention are relative strengths for pt.   Pt with improved potential insight as he states that he wants his wife present when conducting professional business and stated I wouldn't have wanted her there before (the stroke)  See the above treatment note for  details.   OBJECTIVE IMPAIRMENTS include attention, memory, awareness, and executive functioning. These impairments are limiting patient from return to work, managing medications, managing appointments, managing finances, household responsibilities, and ADLs/IADLs. Factors affecting potential to achieve goals and functional outcome are severity of impairments. Patient will benefit from skilled SLP services to address above impairments and improve overall function.  REHAB POTENTIAL: Good  PLAN: SLP FREQUENCY: 1-2x/week  SLP DURATION: 12 weeks  PLANNED INTERVENTIONS: Cognitive reorganization, Internal/external aids, Functional tasks, SLP instruction and feedback, Compensatory strategies, and Patient/family education   Brandace Cargle B. Rubbie, M.S., CCC-SLP, Tree surgeon Certified Brain Injury Specialist [facility]  [facility] Office [phone] Ascom [phone] Fax [phone]

## 2023-09-23 NOTE — Therapy (Signed)
 OUTPATIENT PHYSICAL THERAPY NEURO TREATMENT   Patient Name: [name]. MRN: [id number] DOB:Nov 04, 1950, 73 y.o., male Today's Date: 09/23/2023   PCP: Douglas Remak, MD  REFERRING PROVIDER:    Pegge Toribio PARAS, PA-C    END OF SESSION:    PT End of Session - 09/23/23 1010     Visit Number 11    Number of Visits 24    Date for PT Re-Evaluation 11/12/23    PT Start Time 1018    PT Stop Time 1104    PT Time Calculation (min) 46 min    Equipment Utilized During Treatment Gait belt    Activity Tolerance Patient tolerated treatment well    Behavior During Therapy WFL for tasks assessed/performed            Past Medical History:  Diagnosis Date   Anemia    Cervical myelopathy (HCC)    Chronic venous insufficiency    [name] (chronic kidney disease), stage III (HCC)    Diabetes mellitus without complication (HCC)    GERD (gastroesophageal reflux disease)    Hypercholesteremia    Hypothyroidism    Kidney stones    about 50 in the past--last in March 4/24   Leg weakness, bilateral    due to Myasthenia Gravis   Myasthenia gravis (HCC)    Spinal stenosis    Vitamin B 12 deficiency    Vitamin B 12 deficiency    Wears hearing aid    bilateral   Past Surgical History:  Procedure Laterality Date   BACK SURGERY  09/2015   CARDIAC CATHETERIZATION     CATARACT EXTRACTION W/ INTRAOCULAR LENS IMPLANT     CATARACT EXTRACTION W/PHACO Right 12/21/2015   Procedure: CATARACT EXTRACTION PHACO AND INTRAOCULAR LENS PLACEMENT (IOC);  Surgeon: Douglas Etienne, MD;  Location: [facility];  Service: Ophthalmology;  Laterality: Right;  DIABETIC - insulin  and oral meds   COLONOSCOPY     SHOULDER SURGERY     labrum repair   TONSILLECTOMY     Patient Active Problem List   Diagnosis Date Noted   High blood pressure 08/13/2023   Anemia 08/13/2023   Constipation 08/13/2023   Myasthenia gravis (HCC) 08/13/2023   Acute ischemic right MCA stroke (HCC) 07/19/2023    Lymphedema 12/27/2016   Leg pain 07/15/2016   Chronic venous insufficiency 07/15/2016   Swelling of limb 07/15/2016   Type 2 diabetes mellitus (HCC) 07/15/2016   Hyperlipidemia 07/15/2016    ONSET DATE:   REFERRING DIAG:  Diagnosis  I63.511 (ICD-10-CM) - Cerebral infarction due to unspecified occlusion or stenosis of right middle cerebral artery    THERAPY DIAG:  Muscle weakness (generalized)  Other lack of coordination  Difficulty in walking, not elsewhere classified  Unsteadiness on feet  Hemiplegia and hemiparesis following cerebral infarction affecting left non-dominant side (HCC)  Abnormality of gait and mobility  Rationale for Evaluation and Treatment: Rehabilitation  SUBJECTIVE:  SUBJECTIVE STATEMENT:  Pt and wife report pt's stand pivot transfers using bari-RW have been going well at home. They report patient is doing the transfer with his wife providing distant supervision. Denies stumbles/falls. Reports some pain in L arm, primarily when going to/from sit<>stand.  Pt reports his goal is to learn to walk again if at all possible, even if it is only 10 feet, and he wants to focus on walking during every PT session.     From Initial Eval: Pt reports that he is doing well. Has not been moving much since discharging from [facility], as therapists instructed pt to perform transfers only at home due to high fall risk with ambulation. Continues to have significant weakenss in the LUE with difficulty extending fingers as well as numbness and inattention to the L side of body resulting in multiple cuts on Arm and leg with WC mobility in home.  Will be attaining custom power WC from [employer]. Has loaner currently.   Pt accompanied by: significant other [name]   PERTINENT HISTORY: [name]. is a 73 y.o. male with history of T2DM, HTN, [name], renal calculi, seropositive myasthenia gravis who was admitted to [facility] on 07/14/2023 with reports 3 to 4-day history of stuttering speech and fluctuating LUE weakness followed by fall.  He reported some double vision and per discussion with his neurology I had increased prednisone  to 10 mg/day due to concerns of MG flare.  He was found to have left facial weakness with dysarthria as well as LUE and LLE weakness.  MRI brain done revealing acute right MCA infarction involving right periventricular white matter and extending into right frontal and parietal lobes.    [name]. was admitted to rehab 07/19/2023 for inpatient therapies to consist of PT, ST and OT at least three hours five days a week. Past admission physiatrist, therapy team and rehab RN have worked together to provide customized collaborative inpatient rehab.  He continues on low-dose aspirin  and subcu Lovenox  was used for DVT prophylaxis during his stay.  His blood pressures were monitored on TID basis and has been stable on current regimen.  Serial check of CBC shows H&H to be relatively stable stool guaiac was negative x 1.  He continues to have lower extremity edema and was educated on importance of elevation for edema control.  Constipation has resolved with sorbitol  use on intermittent basis.  P.o. intake has been good.  He was noted to have Candida crura's which was treated with course of Diflucan.  MASD has resolved with use of Gerhardt's Butt cream.   Left hemiplegia as been a limiting factor with left knee pain noted with increase in activity.  EKG was added for support and pain management.  His diabetes has been monitored with ac/hs CBG checks and SSI was use prn for tighter BS control.  Metformin  was resumed at 5 mg twice daily and basal insulin  was slowly titrated upwards to 26 units.  Blood sugars continue to be variable and he was advised to increase metformin  to home dose  and titrate insulin  glargine slowly if blood sugars range over 150.  He has made steady gains during his stay and continues to be limited by mild left inattention with left knee plegia and weakness. He has been evaluated for  speciality wheelchair by [employer]. He will continue to receive follow up outpatient PT, OT and ST at [facility] after discharge.   PAIN:  Are you having pain? No  and reports Numbness in the L H and and Leg   PRECAUTIONS: Fall  **reports gets rash from contact with latex  RED FLAGS: Hx of MG   WEIGHT BEARING RESTRICTIONS: No  FALLS: Has patient fallen in last 6 months? Yes. Number of falls 1  LIVING ENVIRONMENT: Lives with: lives with their spouse Lives in: House/apartment Stairs: Ramps installed while in the Hospital  Has following equipment at home: Vannie - 2 wheeled and Wheelchair (power)  PLOF: Independent with basic ADLs, Independent with household mobility without device, and Independent with community mobility with device with [facility]   PATIENT GOALS: relearn to Walk 15-31ft.   OBJECTIVE:  Note: Objective measures were completed at Evaluation unless otherwise noted.  DIAGNOSTIC FINDINGS: MRI brain done revealing acute right MCA infarction involving right periventricular white matter and extending into right frontal and parietal lobes.  COGNITION: Overall cognitive status: Impaired poor awareness of L side while dual tasking.    SENSATION: Light touch: Impaired   COORDINATION: Limited ROM and strength on the LUE limiting finger to nose.  ROM and strength deficits limit ankle to knee.   EDEMA:  Mild edema in the L UE.   MUSCLE TONE: LLE: Mild    POSTURE: flexed trunk. Lateral lean to the L   LOWER EXTREMITY ROM:     Active  Right Eval Left Eval  Hip flexion    Hip extension    Hip abduction    Hip adduction    Hip internal rotation    Hip external rotation    Knee flexion    Knee extension    Ankle dorsiflexion    Ankle  plantarflexion    Ankle inversion    Ankle eversion     (Blank rows = not tested)  LOWER EXTREMITY MMT:    MMT Right Eval Left Eval  Hip flexion 4+ 4-  Hip extension 4+ 3+  Hip abduction 4+ 3+  Hip adduction 4+ 3+  Hip internal rotation    Hip external rotation    Knee flexion 5 4-  Knee extension 5 4-  Ankle dorsiflexion 5 4  Ankle plantarflexion    Ankle inversion    Ankle eversion    (Blank rows = not tested)  BED MOBILITY:  Findings: Sit to supine Mod A and Max A Supine to sit Mod A and Max A  TRANSFERS: Sit to stand: Min A and Mod A  Assistive device utilized: Environmental consultant - 2 wheeled     Stand to sit: Min A  Assistive device utilized: Environmental consultant - 2 wheeled     Chair to chair: Min A and Mod A  Assistive device utilized: Environmental consultant - 2 wheeled      PT required to stabilize the LUE on RW as well as provide max cues for attention to hand to prevent sliding off hand grip.  At home Pt utilizes hand splint for stability on RW to prevent loss of grip in transfers.   RAMP:  Not tested  CURB:  Not tested  STAIRS: Not tested GAIT: Findings: Gait Characteristics: step to pattern, decreased hip/knee flexion- Left, Left foot flat, ataxic, lateral lean- Left, wide BOS, and poor foot clearance- Left, Distance walked: 73ft, Assistive device utilized:Walker - 2 wheeled, Level of assistance: Mod A and Max A, and Comments: poor awareness of lateral/anterior L LOB as well as poor awareness of LUE causing loos of grip on RW and heavy lateral LOB to the L, +2 assist required to prevent fall and position chair  under patient as he attempted turn at 12ft.    FUNCTIONAL TESTS:  5 times sit to stand: 1:29 min Timed up and go (TUG): unable to complete turn at 11ft.  10 meter walk test: unable to complete on this day.   PATIENT SURVEYS:  Stroke Impact Scale 49/80                                                                                                                              TREATMENT  DATE: 09/23/2023  Pt arrives to session via transport chair.   Pt wearing L swedish knee cage for therapy. Clinic's RW hand splint arrived and used during session today.  R stand pivot transport chair>EOM using bari-RW with L hand splint and light min A. Significant improvement in transfer balance.    Function In Sitting Test (FIST)  (1/2 femur on surface; hips/knees flexed to 90deg)   - mat table used  SCORING KEY: 4 = Independent (completes task independently & successfully) 3 = Verbal Cues/Increased Time (completes task independently & successfully and only needs more time/cues) 2 = Upper Extremity Support (must use UE for support or assistance to complete successfully) 1 = Needs Assistance (unable to complete w/o physical assist; DOCUMENT LEVEL: min, mod, max) 0 = Dependent (requires complete physical assist; unable to complete successfully even w/ physical assist)  Randomly Administer Once Throughout Exam  4 - Anterior Nudge (superior sternum)  4 - Posterior Nudge (between scapular spines)  4 - Lateral Nudge (to dominant side at acromion)     4 - Static sitting (30 seconds)  4 - Sitting, shake 'no' (left and right)  4 - Sitting, eyes closed (30 seconds)   4 - Sitting, lift foot (dominant side, lift foot 1 inch twice)    4 - Pick up object from behind (object at midline, hands breadth posterior)  4 - Forward reach (use dominant arm, must complete full motion) 4 - Lateral reach (use dominant arm, clear opposite ischial tuberosity) 4 - Pick up object from floor (from between feet)   3 - Posterior scooting (move backwards 2 inches)  3 - Anterior scooting (move forward 2 inches)  3 - Lateral scooting (move to dominant side 2 inches)    TOTAL = 53/56  Notes/comments: Significant improvement in all areas with pt now able to scoot without UE support nor facilitation/assist from therapist!!  MCD > 5 points MCID for IP REHAB > 6 points   Patient participated in [employer] and demonstrates increased fall risk as noted by score of 14/56.  (<36= high risk for falls, close to 100%; 37-45 significant >80%; 46-51 moderate >50%; 52-55 lower >25%).   [facility] PT Assessment - 09/23/23 0001       Berg Balance Test   Sit to Stand Needs minimal aid to stand or to stabilize    Standing Unsupported Able to stand 2 minutes with supervision   keeps wide BOS with primary  WBing through R LE   Sitting with Back Unsupported but Feet Supported on Floor or Stool Able to sit safely and securely 2 minutes    Stand to Sit Uses backs of legs against chair to control descent    Transfers Needs one person to assist    Standing Unsupported with Eyes Closed Able to stand 10 seconds with supervision    Standing Unsupported with Feet Together Needs help to attain position and unable to hold for 15 seconds    From Standing, Reach Forward with Outstretched Arm Loses balance while trying/requires external support   needs CGA for safety   From Standing Position, Pick up Object from Floor Unable to try/needs assist to keep balance    From Standing Position, Turn to Look Behind Over each Shoulder Needs assist to keep from losing balance and falling   requires light min A for balance with limited turning   Turn 360 Degrees Needs assistance while turning    Standing Unsupported, Alternately Place Feet on Step/Stool Needs assistance to keep from falling or unable to try    Standing Unsupported, One Foot in [employer] balance while stepping or standing    Standing on One Leg Unable to try or needs assist to prevent fall    Total Score 14           Standing NMR for dynamic balance with focus on more normal BOS and increased midline orientation - CGA to min A for balance - x2 minutes.  Gait training ~12ft (to OT desk) using bari-RW using L hand splint with skilled min/mod assist for balance and AD management. Pt demonstrating the following gait deviations with therapist providing the described  cuing and facilitation for improvement:   Decreased L LE step length, although still slightly reciprocal pattern L LE doesn't adduct as significantly as noted in prior sessions Slow, but controlled gait speed RW veering towards R requiring manual facilitation to correct L foot still having heavy landing due to impaired sensation and impaired coordination/motor control *w/c follow to allow increased distance, pt safety, and to remain sitting in during OT session    PATIENT EDUCATION: Education details: Pt educated throughout session about proper posture and technique with exercises. Improved exercise technique, movement at target joints, use of target muscles after min to mod verbal, visual, tactile cues.  Person educated: Patient and Spouse Education method: Explanation, VC/TC/Demo Education comprehension: verbalized understanding  HOME EXERCISE PROGRAM:  Access Code: [email or web address] URL: [email or web address] Date: 09/11/2023 Prepared by: Massie Dollar  Exercises - Seated Knee Extension AROM  - 1 x daily - 4 x weekly - 3 sets - 10 reps - Seated Hip Abduction  - 1 x daily - 4 x weekly - 3 sets - 10 reps - Seated March  - 1 x daily - 4 x weekly - 3 sets - 10 reps - Sit to Stand with Counter Support  - 1 x daily - 4 x weekly - 3 sets - 10 reps - Supine Short Arc Quad  - 1 x daily - 4 x weekly - 3 sets - 10 reps - Supine Hip Abduction  - 1 x daily - 4 x weekly - 3 sets - 10 reps - Supine Bridge  - 1 x daily - 4 x weekly - 3 sets - 3 reps - 2sec  hold  GOALS: Goals reviewed with patient? Yes  SHORT TERM GOALS: Target date: 09/25/2023    Patient will be independent in home exercise program to improve strength/mobility  for better functional independence with ADLs. Baseline: Initiated on 09/11/2023 Goal status: IN PROGRESS   LONG TERM GOALS: Target date: 11/13/2023    Patient will increase SIS-16 score to equal to or greater than 10points to demonstrate statistically  significant improvement in mobility and quality of life.  Baseline: 49/80 09/19/2023: 45/80 Goal status: IN PROGRESS  2.  Patient (> 25 years old) will complete five times sit to stand test in < 15 seconds indicating an increased LE strength and improved balance. Baseline: 1:22min 09/19/2023: 56.39 seconds using R UE support to push-up from armrest of green chair and requiring skilled min A for balance and lifting/lowering  Goal status: IN PROGRESS  3.  Patient will increase FIST score by > 6 points to demonstrate decreased fall risk during functional activities Baseline:  43/56  09/23/2023: 53/56 Goal status: MET and UPGRADED  09/23/2023: Patient will increase Berg Balance score to > 45/56 to demonstrate improved balance and decreased fall risk during functional activities and ADLs.  Baseline: 09/23/2023: 14/56 Goal status: IN PROGRESS  4.  Patient will increase 10 meter walk test to >0.31m/s as to improve gait speed for better community ambulation and to reduce fall risk. Baseline: 09/09/2023: 0.103 m/s ( and 37 seconds) using bari-RW with skilled heavy min A and +2 w/c follow for safety  09/19/2023: 0.41m/s using bari-RW with skilled min A of 1 and +2 w/c follow for safety Goal status: IN PROGRESS  5.  Patient will reduce timed up and go to <11 seconds to reduce fall risk and demonstrate improved transfer/gait ability. Baseline: unable to complete turn at 10 ft. Mod-max assist due to poor control of LW on RW, resulting in L lateral LOB  09/09/2023: and 32 seconds using bari-RW with L hand splint and +2 on R side for safety, therapist providing skilled min A on L side (Had pt set-up L hand on RW splint, get L foot in proper positioning, and scoot forward in seat before starting the timer) 09/19/2023: 53min43 seconds using bari-RW with only skilled min A of 1 (Had pt set-up L hand on RW splint, get L foot in proper positioning, and scoot forward in seat before starting the timer) Goal  status: IN PROGRESS   ASSESSMENT:  CLINICAL IMPRESSION:   Patient is a 73 y.o. male who was seen today for physical therapy treatment for balance, coordination, and gait deficits following CVA in April. Therapist re-assessed the FIST with patient demonstrating significant improvement in his sitting balance and especially improving his ability to perform scooting along EOM. Therefore, progressed pt's LTG to [employer] to progress towards standing balance goals with pt demonstrating significantly high fall risk via score of 14/56. Performed standing balance interventions targeting improving midline orientation and more equal WBing through B LEs as pt tends to bias towards R LE. Patient participated in additional ~1100ft gait training using bari-RW with skilled min/mod A for balance and AD management with pt continuing to progress towards more functional gait with turning and minor navigation around obstacles. Patient continues to remain highly motivated to regain his ability to ambulate. Pt will continue to benefit from skilled physical therapy interventions to address above impairments, improve QOL, and attain therapy goals.     OBJECTIVE IMPAIRMENTS: Abnormal gait, cardiopulmonary status limiting activity, decreased activity tolerance, decreased balance, decreased cognition, decreased coordination, decreased endurance, decreased knowledge of condition, decreased knowledge of use of DME, decreased mobility, difficulty walking, decreased ROM, decreased strength, decreased safety awareness, dizziness, hypomobility, increased fascial restrictions,  impaired perceived functional ability, increased muscle spasms, impaired sensation, impaired tone, impaired UE functional use, impaired vision/preception, improper body mechanics, postural dysfunction, and obesity.   ACTIVITY LIMITATIONS: carrying, lifting, bending, sitting, standing, squatting, stairs, transfers, bed mobility, bathing, toileting, dressing,  reach over head, hygiene/grooming, locomotion level, and caring for others  PARTICIPATION LIMITATIONS: meal prep, cleaning, laundry, medication management, interpersonal relationship, driving, shopping, community activity, and yard work  PERSONAL FACTORS: Age, Past/current experiences, Time since onset of injury/illness/exacerbation, and 3+ comorbidities: MG, HTN, CVA, lymphedema are also affecting patient's functional outcome.   REHAB POTENTIAL: Good  CLINICAL DECISION MAKING: Unstable/unpredictable  EVALUATION COMPLEXITY: High  PLAN:  PT FREQUENCY: 1-2x/week  PT DURATION: 12 weeks  PLANNED INTERVENTIONS: 97164- PT Re-evaluation, 97750- Physical Performance Testing, 97110-Therapeutic exercises, 97530- Therapeutic activity, [id number]- Neuromuscular re-education, 97535- Self Care, [id number]- Manual therapy, [id number]- Gait training, [phone]- Orthotic Initial, [phone]- Orthotic/Prosthetic subsequent, [phone]- Canalith repositioning, [id number]- Electrical stimulation (unattended), [phone]- Electrical stimulation (manual), [id number]- Wound care (first 20 sq cm), 97598- Wound care (each additional 20 sq cm), Patient/Family education, Balance training, Stair training, Taping, Dry Needling, Joint mobilization, Joint manipulation, Vestibular training, Visual/preceptual remediation/compensation, Cognitive remediation, DME instructions, Wheelchair mobility training, Cryotherapy, and Moist heat  PLAN FOR NEXT SESSION:  Perform another trial of TUG Review HEP as time allows  Gait training with L hand splint on bari-RW  Continue progression of turning through doorways and turning to sit in chair at end of gait as well as navigating around obstacles Continue reinforcing pt management of L hand placement on/off hand splint during transfers to hopefully reach mod-I stand pivot transfer level at home prior to reaching ambulatory level Work on midline orientation and R weight shifting Work on L LE coordination and strength for  improved swing phase advancement    Barrie Wale, PT, DPT, NCS, CSRS Physical Therapist - [place]  [facility]  11:06 AM 09/23/23

## 2023-09-25 ENCOUNTER — Ambulatory Visit: Attending: Physician Assistant | Admitting: Speech Pathology

## 2023-09-25 ENCOUNTER — Ambulatory Visit: Admitting: Physical Therapy

## 2023-09-25 ENCOUNTER — Ambulatory Visit

## 2023-09-25 DIAGNOSIS — I69354 Hemiplegia and hemiparesis following cerebral infarction affecting left non-dominant side: Secondary | ICD-10-CM

## 2023-09-25 DIAGNOSIS — H539 Unspecified visual disturbance: Secondary | ICD-10-CM | POA: Diagnosis present

## 2023-09-25 DIAGNOSIS — R278 Other lack of coordination: Secondary | ICD-10-CM

## 2023-09-25 DIAGNOSIS — M6281 Muscle weakness (generalized): Secondary | ICD-10-CM | POA: Diagnosis present

## 2023-09-25 DIAGNOSIS — R2681 Unsteadiness on feet: Secondary | ICD-10-CM | POA: Diagnosis present

## 2023-09-25 DIAGNOSIS — R41841 Cognitive communication deficit: Secondary | ICD-10-CM | POA: Insufficient documentation

## 2023-09-25 DIAGNOSIS — R262 Difficulty in walking, not elsewhere classified: Secondary | ICD-10-CM | POA: Insufficient documentation

## 2023-09-25 DIAGNOSIS — R269 Unspecified abnormalities of gait and mobility: Secondary | ICD-10-CM | POA: Insufficient documentation

## 2023-09-25 NOTE — Therapy (Signed)
 OUTPATIENT PHYSICAL THERAPY NEURO TREATMENT   Patient Name: [name]. MRN: [id number] DOB:1950-07-25, 73 y.o., male Today's Date: 09/25/2023   PCP: Dennise Remak, MD  REFERRING PROVIDER:    Pegge Toribio PARAS, PA-C    END OF SESSION:    PT End of Session - 09/25/23 0804     Visit Number 12    Number of Visits 24    Date for PT Re-Evaluation 11/12/23    PT Start Time 0845    PT Stop Time 0930    PT Time Calculation (min) 45 min    Equipment Utilized During Treatment Gait belt    Activity Tolerance Patient tolerated treatment well    Behavior During Therapy WFL for tasks assessed/performed            Past Medical History:  Diagnosis Date   Anemia    Cervical myelopathy (HCC)    Chronic venous insufficiency    [name] (chronic kidney disease), stage III (HCC)    Diabetes mellitus without complication (HCC)    GERD (gastroesophageal reflux disease)    Hypercholesteremia    Hypothyroidism    Kidney stones    about 50 in the past--last in March 4/24   Leg weakness, bilateral    due to Myasthenia Gravis   Myasthenia gravis (HCC)    Spinal stenosis    Vitamin B 12 deficiency    Vitamin B 12 deficiency    Wears hearing aid    bilateral   Past Surgical History:  Procedure Laterality Date   BACK SURGERY  09/2015   CARDIAC CATHETERIZATION     CATARACT EXTRACTION W/ INTRAOCULAR LENS IMPLANT     CATARACT EXTRACTION W/PHACO Right 12/21/2015   Procedure: CATARACT EXTRACTION PHACO AND INTRAOCULAR LENS PLACEMENT (IOC);  Surgeon: Dene Etienne, MD;  Location: [facility];  Service: Ophthalmology;  Laterality: Right;  DIABETIC - insulin  and oral meds   COLONOSCOPY     SHOULDER SURGERY     labrum repair   TONSILLECTOMY     Patient Active Problem List   Diagnosis Date Noted   High blood pressure 08/13/2023   Anemia 08/13/2023   Constipation 08/13/2023   Myasthenia gravis (HCC) 08/13/2023   Acute ischemic right MCA stroke (HCC) 07/19/2023    Lymphedema 12/27/2016   Leg pain 07/15/2016   Chronic venous insufficiency 07/15/2016   Swelling of limb 07/15/2016   Type 2 diabetes mellitus (HCC) 07/15/2016   Hyperlipidemia 07/15/2016    ONSET DATE:   REFERRING DIAG:  Diagnosis  I63.511 (ICD-10-CM) - Cerebral infarction due to unspecified occlusion or stenosis of right middle cerebral artery    THERAPY DIAG:  Muscle weakness (generalized)  Difficulty in walking, not elsewhere classified  Unsteadiness on feet  Hemiplegia and hemiparesis following cerebral infarction affecting left non-dominant side (HCC)  Other lack of coordination  Abnormality of gait and mobility  Rationale for Evaluation and Treatment: Rehabilitation  SUBJECTIVE:  SUBJECTIVE STATEMENT:   Pt reports that he is doing well. No plans for holiday weekend. No falls reported by pt.   Pt reports his goal is to learn to walk again if at all possible, even if it is only 10 feet, and he wants to focus on walking during every PT session.     From Initial Eval: Pt reports that he is doing well. Has not been moving much since discharging from [facility], as therapists instructed pt to perform transfers only at home due to high fall risk with ambulation. Continues to have significant weakenss in the LUE with difficulty extending fingers as well as numbness and inattention to the L side of body resulting in multiple cuts on Arm and leg with WC mobility in home.  Will be attaining custom power WC from [employer]. Has loaner currently.   Pt accompanied by: significant other [name]   PERTINENT HISTORY: [name]. is a 73 y.o. male with history of T2DM, HTN, [name], renal calculi, seropositive myasthenia gravis who was admitted to [facility] on 07/14/2023 with reports 3 to 4-day history of  stuttering speech and fluctuating LUE weakness followed by fall.  He reported some double vision and per discussion with his neurology I had increased prednisone  to 10 mg/day due to concerns of MG flare.  He was found to have left facial weakness with dysarthria as well as LUE and LLE weakness.  MRI brain done revealing acute right MCA infarction involving right periventricular white matter and extending into right frontal and parietal lobes.    Douglas LITTIE [name]. was admitted to rehab 07/19/2023 for inpatient therapies to consist of PT, ST and OT at least three hours five days a week. Past admission physiatrist, therapy team and rehab RN have worked together to provide customized collaborative inpatient rehab.  He continues on low-dose aspirin  and subcu Lovenox  was used for DVT prophylaxis during his stay.  His blood pressures were monitored on TID basis and has been stable on current regimen.  Serial check of CBC shows H&H to be relatively stable stool guaiac was negative x 1.  He continues to have lower extremity edema and was educated on importance of elevation for edema control.  Constipation has resolved with sorbitol  use on intermittent basis.  P.o. intake has been good.  He was noted to have Candida crura's which was treated with course of Diflucan.  MASD has resolved with use of Gerhardt's Butt cream.   Left hemiplegia as been a limiting factor with left knee pain noted with increase in activity.  EKG was added for support and pain management.  His diabetes has been monitored with ac/hs CBG checks and SSI was use prn for tighter BS control.  Metformin  was resumed at 5 mg twice daily and basal insulin  was slowly titrated upwards to 26 units.  Blood sugars continue to be variable and he was advised to increase metformin  to home dose and titrate insulin  glargine slowly if blood sugars range over 150.  He has made steady gains during his stay and continues to be limited by mild left inattention with left  knee plegia and weakness. He has been evaluated for  speciality wheelchair by [employer]. He will continue to receive follow up outpatient PT, OT and ST at [facility] after discharge.   PAIN:  Are you having pain? No and reports Numbness in the L H and and Leg   PRECAUTIONS: Fall  **reports gets rash from contact with latex  RED FLAGS: Hx of MG   WEIGHT BEARING RESTRICTIONS: No  FALLS: Has patient fallen in last 6 months? Yes. Number of falls 1  LIVING ENVIRONMENT: Lives with: lives with their spouse Lives in: House/apartment Stairs: Ramps installed while in the Hospital  Has following equipment at home: Vannie - 2 wheeled and Wheelchair (power)  PLOF: Independent with basic ADLs, Independent with household mobility without device, and Independent with community mobility with device with [facility]   PATIENT GOALS: relearn to Walk 15-40ft.   OBJECTIVE:  Note: Objective measures were completed at Evaluation unless otherwise noted.  DIAGNOSTIC FINDINGS: MRI brain done revealing acute right MCA infarction involving right periventricular white matter and extending into right frontal and parietal lobes.  COGNITION: Overall cognitive status: Impaired poor awareness of L side while dual tasking.    SENSATION: Light touch: Impaired   COORDINATION: Limited ROM and strength on the LUE limiting finger to nose.  ROM and strength deficits limit ankle to knee.   EDEMA:  Mild edema in the L UE.   MUSCLE TONE: LLE: Mild    POSTURE: flexed trunk. Lateral lean to the L   LOWER EXTREMITY ROM:     Active  Right Eval Left Eval  Hip flexion    Hip extension    Hip abduction    Hip adduction    Hip internal rotation    Hip external rotation    Knee flexion    Knee extension    Ankle dorsiflexion    Ankle plantarflexion    Ankle inversion    Ankle eversion     (Blank rows = not tested)  LOWER EXTREMITY MMT:    MMT Right Eval Left Eval  Hip flexion 4+ 4-  Hip  extension 4+ 3+  Hip abduction 4+ 3+  Hip adduction 4+ 3+  Hip internal rotation    Hip external rotation    Knee flexion 5 4-  Knee extension 5 4-  Ankle dorsiflexion 5 4  Ankle plantarflexion    Ankle inversion    Ankle eversion    (Blank rows = not tested)  BED MOBILITY:  Findings: Sit to supine Mod A and Max A Supine to sit Mod A and Max A  TRANSFERS: Sit to stand: Min A and Mod A  Assistive device utilized: Environmental consultant - 2 wheeled     Stand to sit: Min A  Assistive device utilized: Environmental consultant - 2 wheeled     Chair to chair: Min A and Mod A  Assistive device utilized: Environmental consultant - 2 wheeled      PT required to stabilize the LUE on RW as well as provide max cues for attention to hand to prevent sliding off hand grip.  At home Pt utilizes hand splint for stability on RW to prevent loss of grip in transfers.   RAMP:  Not tested  CURB:  Not tested  STAIRS: Not tested GAIT: Findings: Gait Characteristics: step to pattern, decreased hip/knee flexion- Left, Left foot flat, ataxic, lateral lean- Left, wide BOS, and poor foot clearance- Left, Distance walked: 64ft, Assistive device utilized:Walker - 2 wheeled, Level of assistance: Mod A and Max A, and Comments: poor awareness of lateral/anterior L LOB as well as poor awareness of LUE causing loos of grip on RW and heavy lateral LOB to the L, +2 assist required to prevent fall and position chair under patient as he attempted turn at 25ft.    FUNCTIONAL TESTS:  5 times sit to stand: 1:29 min Timed up  and go (TUG): unable to complete turn at 72ft.  10 meter walk test: unable to complete on this day.   PATIENT SURVEYS:  Stroke Impact Scale 49/80                                                                                                                              TREATMENT DATE: 09/25/2023  Pt arrives to session via transport chair.   Pt wearing L swedish knee cage for therapy. Clinic's RW hand splint arrived and used during session  today.  R stand pivot transport chair>EOM using bari-RW with L hand splint and light min A. Significant improvement in transfer balance.   Sit<>stand x 5 with min assist for anterior weight shifft to prevent posterior bias once in standing.   PT instructed pt in TUG: 78.125 sec (1:20.62, min 1:15. ; average of 3 trials; >13.5 sec indicates increased fall risk) min assist throughout with VC for AD management and safety in turns.   Standing NMR for dynamic balance with focus on more normal BOS and increased midline orientation - CGA to min A for balance - x2 minutes.  Dynamic Gait training  to weave through 2 cones x 2 for 14ft ft. Gait with assist from Daughter 14ft x 2 in straight line with 90 deg and 180deg turn to sit in arm chair. Additional gait to OT gym with 180deg turn, and 2 90 deg turns x 84ft. Min assist overall and min cues for awareness of the LLE while engaged in conversation to reduce fall risk and maintain adequate step width.   Improved AD management to maintain straight path with only VC to AD management as well as improved symmetry of step length.     PATIENT EDUCATION: Education details: Pt educated throughout session about proper posture and technique with exercises. Improved exercise technique, movement at target joints, use of target muscles after min to mod verbal, visual, tactile cues.  Person educated: Patient and Spouse Education method: Explanation, VC/TC/Demo Education comprehension: verbalized understanding  HOME EXERCISE PROGRAM:  Access Code: [email or web address] URL: [email or web address] Date: 09/11/2023 Prepared by: Massie Dollar  Exercises - Seated Knee Extension AROM  - 1 x daily - 4 x weekly - 3 sets - 10 reps - Seated Hip Abduction  - 1 x daily - 4 x weekly - 3 sets - 10 reps - Seated March  - 1 x daily - 4 x weekly - 3 sets - 10 reps - Sit to Stand with Counter Support  - 1 x daily - 4 x weekly - 3 sets - 10 reps - Supine Short Arc Quad   - 1 x daily - 4 x weekly - 3 sets - 10 reps - Supine Hip Abduction  - 1 x daily - 4 x weekly - 3 sets - 10 reps - Supine Bridge  - 1 x daily - 4 x weekly - 3 sets - 3 reps - 2sec  hold  GOALS: Goals  reviewed with patient? Yes  SHORT TERM GOALS: Target date: 09/25/2023    Patient will be independent in home exercise program to improve strength/mobility for better functional independence with ADLs. Baseline: Initiated on 09/11/2023 Goal status: IN PROGRESS   LONG TERM GOALS: Target date: 11/13/2023    Patient will increase SIS-16 score to equal to or greater than 10points to demonstrate statistically significant improvement in mobility and quality of life.  Baseline: 49/80 09/19/2023: 45/80 Goal status: IN PROGRESS  2.  Patient (> 1 years old) will complete five times sit to stand test in < 15 seconds indicating an increased LE strength and improved balance. Baseline: 1:46min 09/19/2023: 56.39 seconds using R UE support to push-up from armrest of green chair and requiring skilled min A for balance and lifting/lowering  Goal status: IN PROGRESS  3.  Patient will increase FIST score by > 6 points to demonstrate decreased fall risk during functional activities Baseline:  43/56  09/23/2023: 53/56 Goal status: MET and UPGRADED  09/23/2023: Patient will increase Berg Balance score to > 45/56 to demonstrate improved balance and decreased fall risk during functional activities and ADLs.  Baseline: 09/23/2023: 14/56 Goal status: IN PROGRESS  4.  Patient will increase 10 meter walk test to >0.18m/s as to improve gait speed for better community ambulation and to reduce fall risk. Baseline: 09/09/2023: 0.103 m/s ( and 37 seconds) using bari-RW with skilled heavy min A and +2 w/c follow for safety  09/19/2023: 0.22m/s using bari-RW with skilled min A of 1 and +2 w/c follow for safety Goal status: IN PROGRESS  5.  Patient will reduce timed up and go to <11 seconds to reduce fall risk and  demonstrate improved transfer/gait ability. Baseline: unable to complete turn at 10 ft. Mod-max assist due to poor control of LW on RW, resulting in L lateral LOB  09/09/2023: and 32 seconds using bari-RW with L hand splint and +2 on R side for safety, therapist providing skilled min A on L side (Had pt set-up L hand on RW splint, get L foot in proper positioning, and scoot forward in seat before starting the timer) 09/19/2023: 72min43 seconds using bari-RW with only skilled min A of 1 (Had pt set-up L hand on RW splint, get L foot in proper positioning, and scoot forward in seat before starting the timer) 7/2: 78sec(1:18.125) with RW and min assist overall.  Goal status: IN PROGRESS   ASSESSMENT:  CLINICAL IMPRESSION:   Patient is a 73 y.o. male who was seen today for physical therapy treatment for balance, coordination, and gait deficits following CVA in April. Re-assessed TUG with improved AD management, step width and length symmetry, and saftey in turns. Family education initiated with daughter to perform gait in straight line x 24ft with additional +2 assist from PT for safety. Cues for proper use of gait belt and control of the RW in turns to reduce fall risk and maintain position COM within BOS of RW. Pt will continue to benefit from skilled physical therapy interventions to address above impairments, improve QOL, and attain therapy goals.     OBJECTIVE IMPAIRMENTS: Abnormal gait, cardiopulmonary status limiting activity, decreased activity tolerance, decreased balance, decreased cognition, decreased coordination, decreased endurance, decreased knowledge of condition, decreased knowledge of use of DME, decreased mobility, difficulty walking, decreased ROM, decreased strength, decreased safety awareness, dizziness, hypomobility, increased fascial restrictions, impaired perceived functional ability, increased muscle spasms, impaired sensation, impaired tone, impaired UE functional use, impaired  vision/preception, improper body mechanics, postural  dysfunction, and obesity.   ACTIVITY LIMITATIONS: carrying, lifting, bending, sitting, standing, squatting, stairs, transfers, bed mobility, bathing, toileting, dressing, reach over head, hygiene/grooming, locomotion level, and caring for others  PARTICIPATION LIMITATIONS: meal prep, cleaning, laundry, medication management, interpersonal relationship, driving, shopping, community activity, and yard work  PERSONAL FACTORS: Age, Past/current experiences, Time since onset of injury/illness/exacerbation, and 3+ comorbidities: MG, HTN, CVA, lymphedema are also affecting patient's functional outcome.   REHAB POTENTIAL: Good  CLINICAL DECISION MAKING: Unstable/unpredictable  EVALUATION COMPLEXITY: High  PLAN:  PT FREQUENCY: 1-2x/week  PT DURATION: 12 weeks  PLANNED INTERVENTIONS: 97164- PT Re-evaluation, 97750- Physical Performance Testing, 97110-Therapeutic exercises, 97530- Therapeutic activity, [id number]- Neuromuscular re-education, 97535- Self Care, [id number]- Manual therapy, [id number]- Gait training, [phone]- Orthotic Initial, [phone]- Orthotic/Prosthetic subsequent, [phone]- Canalith repositioning, [id number]- Electrical stimulation (unattended), [phone]- Electrical stimulation (manual), [id number]- Wound care (first 20 sq cm), 97598- Wound care (each additional 20 sq cm), Patient/Family education, Balance training, Stair training, Taping, Dry Needling, Joint mobilization, Joint manipulation, Vestibular training, Visual/preceptual remediation/compensation, Cognitive remediation, DME instructions, Wheelchair mobility training, Cryotherapy, and Moist heat  PLAN FOR NEXT SESSION:  Review HEP as time allows  Gait training with L hand splint on bari-RW  Continue progression of turning through doorways and turning to sit in chair at end of gait as well as navigating around obstacles Continue reinforcing pt management of L hand placement on/off hand splint during transfers  to hopefully reach mod-I stand pivot transfer level at home prior to reaching ambulatory level Family education for gait in simulated home environment.    Massie Dollar PT, DPT  Physical Therapist - [place]  [facility]  9:34 AM 09/25/23

## 2023-09-25 NOTE — Therapy (Signed)
 OUTPATIENT SPEECH LANGUAGE PATHOLOGY  COGNITION TREATMENT NOTE 10th VISIT PROGRESS NOTE   Patient Name: [name]. 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 Pt has eagerly attended and participated in skilled ST services. As a result he has made great progress and met 3 of 3 STGs and LTGs. See skilled intervention, clinical impressions, and goals below for details.    End of Session - 09/25/23 0945     Visit Number 10    Number of Visits 25    Date for SLP Re-Evaluation 11/12/23    Authorization Type [employer]    Authorization Time Period 08/20/2023 thru 10/15/2023    Authorization - Visit Number 9    Authorization - Number of Visits 16    Progress Note Due on Visit 10    SLP Start Time 0800    SLP Stop Time  0845    SLP Time Calculation (min) 45 min    Activity Tolerance Patient tolerated treatment well          Past Medical History:  Diagnosis Date   Anemia    Cervical myelopathy (HCC)    Chronic venous insufficiency    [name] (chronic kidney disease), stage III (HCC)    Diabetes mellitus without complication (HCC)    GERD (gastroesophageal reflux disease)    Hypercholesteremia    Hypothyroidism    Kidney stones    about 50 in the past--last in March 4/24   Leg weakness, bilateral    due to Myasthenia Gravis   Myasthenia gravis (HCC)    Spinal stenosis    Vitamin B 12 deficiency    Vitamin B 12 deficiency    Wears hearing aid    bilateral   Past Surgical History:  Procedure Laterality Date   BACK SURGERY  09/2015   CARDIAC CATHETERIZATION     CATARACT EXTRACTION W/ INTRAOCULAR LENS IMPLANT      CATARACT EXTRACTION W/PHACO Right 12/21/2015   Procedure: CATARACT EXTRACTION PHACO AND INTRAOCULAR LENS PLACEMENT (IOC);  Surgeon: Dene Etienne, MD;  Location: [facility];  Service: Ophthalmology;  Laterality: Right;  DIABETIC - insulin  and oral meds   COLONOSCOPY     SHOULDER SURGERY     labrum repair   TONSILLECTOMY     Patient Active Problem List   Diagnosis Date Noted   High blood pressure 08/13/2023   Anemia 08/13/2023   Constipation 08/13/2023   Myasthenia gravis (HCC) 08/13/2023   Acute ischemic right MCA stroke (HCC) 07/19/2023   Lymphedema 12/27/2016   Leg pain 07/15/2016   Chronic venous insufficiency 07/15/2016   Swelling of limb 07/15/2016   Type 2 diabetes mellitus (HCC) 07/15/2016   Hyperlipidemia 07/15/2016    ONSET DATE: 07/19/2023   REFERRING DIAG: P36.488 (ICD-10-CM) - Cerebral infarction due to unspecified occlusion or stenosis of right middle cerebral artery  THERAPY DIAG:  Cognitive communication deficit  Rationale for Evaluation and Treatment Rehabilitation  SUBJECTIVE:   PERTINENT HISTORY and DIAGNOSTIC FINDINGS:   Mr. Cho is a 73 y.o. 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  CT  Brain w/o contrast: 07/14/2023 New hypodensity within the right periventricular white matter extending into the inferior frontal lobe/operculum compatible with a recent infarct.   MRI Brain w/o contrast: 07/15/2023 IMPRESSION:  Acute right MCA distribution infarct involving the right periventricular white matter extending into the right frontal and parietal lobes and insula , some of which correlates to the recent CT finding. No significant associated mass effect or associated hemorrhage.   MRA head angiogram 07/16/2023 IMPRESSION:  Focal occlusion of  the right inferior division distal M2 MCA (6:123) in keeping with known right MCA infarct when compared to prior MRI brain 07/15/2023.   CT head 07/17/2023 IMPRESSION:  Expansion of territory involved in the right MCA infarct compared with MRI of 07/15/2023. Increasing local mass effect with new leftward midline shift of 3 mm. No hemorrhage.   Speech Language Evaluation at [facility] 07/19/2023 Patient presents with cognitive deficits in the areas of problem solving and left inattention as evidenced throughout patient/family interview and performance on Cognistat evaluation. Patient further demonstrates impairments in the areas of intellectual, anticipatory, and emergent awareness of deficits/mistakes.   Speech Language Evaluation at [facility] 07/31/2023 Skilled therapy session focused on re-evaluation of cognitive skills utilizing the CLQT standardized assessment. Patient scored with overall mild cognitive deficits in the subtests of attention, executive functioning and visual spatial skills. Patient scored WFL on the clock drawing task, language and memory.   PAIN:  Are you having pain? No   FALLS: Has patient fallen in last 6 months?  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 SLP facilitated session by providing the following interventions:  Pt and his daughter report that pt worked on taxes fro a costumer with good  ability and good self-awareness when he fat fingered an incorrect keystroke - in addition he started teaching his grand daughter some of the information with functional ability - in additional he stated I typed a full fledge email yesterday.  SLP introduced complex problem solving task (Double Solitaire) to target scanning to left, working memory, higher level problem solving, alternating attention and self-monitoring. With intermittent supervision to Min A cues, pt able to complete with > 95% accuracy.  Education provided to pt's daughter on ways to facilitate cuing at home when playing game with her or pt's grand daughter.  PATIENT EDUCATION: Education details: see above Person educated: Patient and Spouse Education method: Explanation Education comprehension: needs further education   HOME EXERCISE PROGRAM:   See above for details    GOALS:  Goals reviewed with patient? Yes  SHORT TERM GOALS: Target date: 10 sessions  Updated: 09/25/2023 The patient will complete a simple word search puzzle (66 or smaller) within 10 minutes given intermittent moderate verbal cues. Baseline: Goal status: INITIAL: MET  2.  The patient will recall sentence-level information after a 5-minute delay using the spaced retrieval technique. Baseline:  Goal status: INITIAL: MET  3.  With moderate cues, pt will identify 3 cognitive strengths/weaknesses during tasks.  Baseline:  Goal status: INITIAL: MET  4. Pt will complete semi-complex reasoning task with supervision A to achieve > 90% accuracy.  Baseline:   Goal status: INITIAL  5. Pt will recall 2-3 compensatory memory strategies that he has implemented during functional activity over 3 out of 5 session. Baseline: Goal Status: INITIAL.     LONG TERM GOALS: Target date: 11/12/2023  Updated: 09/25/2023 The patient will read sentence-level information aloud at 80% accuracy given intermittent minimal verbal cues in order to increase ability to  safely live independently. Baseline:  Goal status: INITIAL: MET  2.   With Min A, patient will use external aid for functional recall of completed/upcoming activities 75% accuracy.     Baseline:  Goal status: INITIAL: MET  3.   With Min A, patient will complete semi-complex visual memory tasks with 75% accuracy independently.   Baseline:  Goal status: INITIAL: MET  4.  With Supervision A, patient will use strategies (ie., white board, daily planner/calendar, Apps on phone) to improve memory for important information with 80% accuracy.  Baseline: Goal status: INITIAL    ASSESSMENT:  CLINICAL IMPRESSION: Patient is a 73 y.o. right handed male who was seen today for a cognitive communication treatment d/t right MCA CVA. Pt presents with overall  moderate cognitive impairment with severe deficits in delayed memory, severe deficits in visuospatial/constructional abilities (apart from left inattention), decreased awareness of deficits/errors and mild deficits in higher level attention. Immediate memory, language and selective attention are relative strengths for pt.   Pt continues with improved abilities within ST sessions as well as report of improved ability within functional tasks at home.  See the above treatment note for details.   OBJECTIVE IMPAIRMENTS include attention, memory, awareness, and executive functioning. These impairments are limiting patient from return to work, managing medications, managing appointments, managing finances, household responsibilities, and ADLs/IADLs. Factors affecting potential to achieve goals and functional outcome are severity of impairments. Patient will benefit from skilled SLP services to address above impairments and improve overall function.  REHAB POTENTIAL: Good  PLAN: SLP FREQUENCY: 1-2x/week  SLP DURATION: 12 weeks  PLANNED INTERVENTIONS: Cognitive reorganization, Internal/external aids, Functional tasks, SLP instruction and feedback,  Compensatory strategies, and Patient/family education   Fleta Borgeson B. Rubbie, M.S., CCC-SLP, Tree surgeon Certified Brain Injury Specialist [facility]  [facility] Rehabilitation Services Office [phone] Ascom [phone] Fax [phone]

## 2023-09-29 NOTE — Therapy (Signed)
 Occupational Therapy NEURO TREATMENT NOTE  Patient Name: [name]. MRN: [id number] DOB:11-Dec-1950, 73 y.o., male Today's Date: 09/29/2023  PCP: Alm Rower, MD REFERRING PROVIDER: Hermann Toribio PARAS, PA-C  END OF SESSION:  OT End of Session - 09/29/23 1519     Visit Number 12    Number of Visits 24    Date for OT Re-Evaluation 11/12/23    OT Start Time 0930    OT Stop Time 1015    OT Time Calculation (min) 45 min    Activity Tolerance Patient tolerated treatment well    Behavior During Therapy WFL for tasks assessed/performed         Past Medical History:  Diagnosis Date   Anemia    Cervical myelopathy (HCC)    Chronic venous insufficiency    [name] (chronic kidney disease), stage III (HCC)    Diabetes mellitus without complication (HCC)    GERD (gastroesophageal reflux disease)    Hypercholesteremia    Hypothyroidism    Kidney stones    about 50 in the past--last in March 4/24   Leg weakness, bilateral    due to Myasthenia Gravis   Myasthenia gravis (HCC)    Spinal stenosis    Vitamin B 12 deficiency    Vitamin B 12 deficiency    Wears hearing aid    bilateral   Past Surgical History:  Procedure Laterality Date   BACK SURGERY  09/2015   CARDIAC CATHETERIZATION     CATARACT EXTRACTION W/ INTRAOCULAR LENS IMPLANT     CATARACT EXTRACTION W/PHACO Right 12/21/2015   Procedure: CATARACT EXTRACTION PHACO AND INTRAOCULAR LENS PLACEMENT (IOC);  Surgeon: Dene Etienne, MD;  Location: [facility];  Service: Ophthalmology;  Laterality: Right;  DIABETIC - insulin  and oral meds   COLONOSCOPY     SHOULDER SURGERY     labrum repair   TONSILLECTOMY     Patient Active Problem List   Diagnosis Date Noted   High blood pressure 08/13/2023   Anemia 08/13/2023   Constipation 08/13/2023   Myasthenia gravis (HCC) 08/13/2023   Acute ischemic right MCA stroke (HCC) 07/19/2023   Lymphedema 12/27/2016   Leg pain 07/15/2016   Chronic venous insufficiency  07/15/2016   Swelling of limb 07/15/2016   Type 2 diabetes mellitus (HCC) 07/15/2016   Hyperlipidemia 07/15/2016   ONSET DATE: 07/13/2023  REFERRING DIAG: Acute ischemic R MCA CVA  THERAPY DIAG: muscle weakness (generalized), other lack of coordination, vision disturbance, hemiplegia and hemiparesis following cerebral infarction affecting left non-dominant side (HCC)  Rationale for Evaluation and Treatment: Rehabilitation  SUBJECTIVE:  SUBJECTIVE STATEMENT: Pt reports L wrist pain with Wbing activities.  Pt accompanied by: significant other  PERTINENT HISTORY: Pt. Was admitted to [facility] on 07/13/2023 after sustaining a CVA while on a trip to the beach. Pt. Was diagnosed with R MCA CVA. Pt. Was admitted to inpatient rehab from 07/19/2023-08/13/2023. Past medical history includes high BP, anemia, and Myasthenia Gravis.   PRECAUTIONS: None  WEIGHT BEARING RESTRICTIONS: No  PAIN:  Are you having pain? 5/10 L wrist  FALLS: Has patient fallen in last 6 months? Yes  LIVING ENVIRONMENT: Lives with: lives with their family and lives with their spouse-  Lives in: House/apartment- 2 story home and resides mostly on the 1st floor Stairs: No- Ramped entrance Has following equipment at home:  Vannie, cane, Steadi, shower chair, handheld shower head, bedside commode  PLOF: Independent and Independent with basic ADLs Independent at home   PATIENT GOALS: Would like  to walk 10-20 feet each time.   OBJECTIVE:  Note: Objective measures were completed at Evaluation unless otherwise noted.  HAND DOMINANCE: Right  ADLs: Overall ADLs:  Transfers/ambulation related to ADLs: Eating: Independent, 100% R handed Grooming: independent UB Dressing: Can put on shirt independently LB Dressing: Difficulty with pants and requires assistance getting the pants on his feet, has difficulty putting on shoes and socks Toileting: Tot A for toilet hygiene. Uses Steadi during toilet transfers. Bathing: Requires  assist with using the L arm to wash the R arm. Tub Shower transfers: Roll in shower, built in shower chair, grab bars and hand held shower head.  IADLs: Shopping: Total A (Wife typically did the shopping prior to onset)   Light housekeeping: Total A (wife typically performs prior to onset) Meal Prep: Independent with light snack prep Community mobility: No driving, able to get in/out of the car Medication management: Set up assistance from his wife using a pill box (pt. Is responsible for taking medications at the correct time) Financial management: family members check behind him. Handwriting: TBD Hobbies: gardening, wood working and sports- [employer] Work history: Owns a tax business MOBILITY STATUS: Needs Assist:    POSTURE COMMENTS:   Sitting balance: Good supported sitting balance  FUNCTIONAL OUTCOME MEASURES: TBD   UPPER EXTREMITY ROM:    Active ROM Right Eval  [facility] Right 09/23/2023 [facility] Left eval Left 09/23/2023  Shoulder flexion   89(110) 109(114)  Shoulder abduction   98(120) 109(120)  Shoulder adduction      Shoulder extension      Shoulder internal rotation      Shoulder external rotation      Elbow flexion   120(140) 130(136)  Elbow extension   -28(-10) -5(0)  Wrist flexion   60(60) Rest at 60 50(65)  Wrist extension   -48(42) 10(40)  Wrist ulnar deviation      Wrist radial deviation      Wrist pronation      Wrist supination      (Blank rows = not tested)  Eval: L Digit flexion: 2nd:3cm (0cm) 3rd: 0cm (0cm), 4th: 0cm (0cm), 5th: 2cm (0cm)  09/23/23:  L Digit flexion to [facility]: 2nd: 3.5cm (0cm), 3rd: 4 cm(0cm), 4th 3.5cm (0cm), 5th: 2cm (0cm)  Eval:  L Digit extension: Active Gross digit extension through 10% of ROM  09/23/23:  L Digit extension: Active Gross digit extension through 50% of ROM   UPPER EXTREMITY MMT:     MMT Right eval Left eval Left 09/23/2023  Shoulder flexion 5 3- 3-/5  Shoulder abduction 5 3- 3-/5  Shoulder adduction      Shoulder extension     Shoulder internal rotation     Shoulder external rotation     Middle trapezius     Lower trapezius     Elbow flexion 5 TBD 3+/5  Elbow extension 5 TBD 4-/5  Wrist flexion   2/5  Wrist extension  2- 2/5  Wrist ulnar deviation     Wrist radial deviation     Wrist pronation     Wrist supination     (Blank rows = not tested)  HAND FUNCTION:   Grip strength:   Right: 94#, Left: 1#,   Lateral Key Pinch strength:   Right: 14#, Left: 3#  COORDINATION:  Box and Blocks: Right: 34 blocks completed in 1 min., Left: 0 blocks completed in 1 min.   SENSATION: Sensation feels different in both hands.  L hand Light touch- impaired L hand  Proprioception- impaired  EDEMA: Has had hx of edema, and came to Eval session with swelling in the L hand/wrist/forearm.   *pt. Has a laceration from his dog on the dorsal aspect of the L hand.   MUSCLE TONE: Increased tone through the UE and hand flexors.   COGNITION: Overall cognitive status: Within functional limits for tasks assessed  VISION: Subjective report: L sided inattention Baseline vision: Wears glasses for reading only Visual history: Right visual occlusion  VISION ASSESSMENT: To be assessed  PERCEPTION: TBD  PRAXIS: Impaired                                                                                                                 TREATMENT DATE: 09/2023 Therapeutic Exercise: -Passive stretching for L wrist and digit ext and forearm sup in prep for engaging LUE into grasp/release activities.  -L wrist strengthening: Trial of 1# dumbbell, down graded to AROM for L wrist flex/ext/RD/UD with min A to stabilize forearm; 3 sets 10 reps each. -L forearm pron/sup with 1# dumbbell, min A to stabilize L upper arm against side to isolate forearm; 3 sets 10 reps each.  Neuro re-ed: -Facilitated L hand lateral pinch strengthening and forward/lateral reaching patterns working to move yellow and red clothespins  on/off a vertical/horiz/diagonal dowel.  Required mod A for set up of clip in hand.  PATIENT EDUCATION: Education details:  Wrist strengthening Person educated: Patient and daughter Education method: Explanation, Demonstration, Tactile cues, and Verbal cues, visual handout Education comprehension: verbalized understanding, returned demonstration, and needs further education  HOME EXERCISE PROGRAM: Self passive stretching throughout the LUE; grasp/release activities, wrist strengthening   GOALS: Goals reviewed with patient? Yes  SHORT TERM GOALS: Target date: 10/01/2023   Pt. Will be independent with HEP for the LUE.  Baseline: 09/19/23: Requires assist from his wife Eval; no current HEP Goal status: Ongoing   LONG TERM GOALS: Target date: 11/12/2023  Pt. Will increase L shoulder flexion by 10 degrees to be able to reach up to shelves.  Baseline: 09/19/23: Pt. Continues to be limited with reaching up to shelves. Eval: L shoulder flexion is 89 (110). Goal status: Ongoing  2.  Pt. Will increase L shoulder abduction by 10 degrees to assist with underarm hygiene.  Baseline: 09/19/23: Pt. Continues to require assist with underarm hygiene.Eval: L shoulder abduction is 98 (120).  Goal status: Ongoing  3.  Pt. Will improve L wrist extension by 10 degrees to be able to initiate anticipation of grasping for objects.  Baseline: 09/19/23: Consistent activation noted in the left wrist extensors with facilitation. Eval: -48 (42) Goal status: Ongoing  4.  Pt. Will improve active gross digit extension through 50% of the range to be able to release objects in his hand consistently.  Baseline: 09/19/23: gross digit extension noted through 25% range with facilitation.Eval: gross digit extension is 10% of the range  Goal status: Goal updated to 50% of the range  5.  Pt. Will independently demonstrate visual compensatory strategies when navigating through environment  and during tabletop tasks. Baseline:  09/19/23: Pt. Requires fewer cues for left sided awareness Eval: Pt. Requires consistent cuing for L sided awareness.  Goal status: Ongoing  ASSESSMENT:  CLINICAL IMPRESSION: Pt. tolerated treatment well today.  Pt continues to have L wrist pain with Wbing activities.  Focus this date on L wrist strengthening within pain free range.  Trialed 1# dumbbell for wrist strengthening, but downgraded to AROM without resistance to achieve better form and greater ROM.  Pt continues to benefit from OT services to work on improving LUE functioning to increase engagement of the LUE during daily task, and to provide education about compensatory strategies during ADLs/IADLs.  PERFORMANCE DEFICITS: in functional skills including ADLs, IADLs, coordination, dexterity, proprioception, sensation, edema, tone, ROM, strength, pain, Fine motor control, Gross motor control, mobility, balance, endurance, vision, and UE functional use, cognitive skills including perception, and psychosocial skills including coping strategies, environmental adaptation, and routines and behaviors.   IMPAIRMENTS: are limiting patient from ADLs, IADLs, and leisure.   CO-MORBIDITIES: may have co-morbidities  that affects occupational performance. Patient will benefit from skilled OT to address above impairments and improve overall function.  MODIFICATION OR ASSISTANCE TO COMPLETE EVALUATION: Min-Moderate modification of tasks or assist with assess necessary to complete an evaluation.  OT OCCUPATIONAL PROFILE AND HISTORY: Detailed assessment: Review of records and additional review of physical, cognitive, psychosocial history related to current functional performance.  CLINICAL DECISION MAKING: Moderate - several treatment options, min-mod task modification necessary  REHAB POTENTIAL: Good  EVALUATION COMPLEXITY: Moderate    PLAN:  OT FREQUENCY: 2x/week  OT DURATION: 12 weeks  PLANNED INTERVENTIONS: 97168 OT Re-evaluation, 97535 self  care/ADL training, 02889 therapeutic exercise, 97530 therapeutic activity, 97112 neuromuscular re-education, 97140 manual therapy, 97018 paraffin, 02989 moist heat, 97010 cryotherapy, 97034 contrast bath, 97760 Orthotic Initial, 97763 Orthotic/Prosthetic subsequent, passive range of motion, visual/perceptual remediation/compensation, energy conservation, and DME and/or AE instructions  RECOMMENDED OTHER SERVICES: PT and ST  CONSULTED AND AGREED WITH PLAN OF CARE: Patient and family member/caregiver  PLAN FOR NEXT SESSION: see above  Inocente Blazing, MS, OTR/L

## 2023-10-01 ENCOUNTER — Ambulatory Visit: Admitting: Speech Pathology

## 2023-10-01 ENCOUNTER — Ambulatory Visit: Admitting: Occupational Therapy

## 2023-10-01 ENCOUNTER — Ambulatory Visit: Admitting: Physical Therapy

## 2023-10-01 DIAGNOSIS — I69354 Hemiplegia and hemiparesis following cerebral infarction affecting left non-dominant side: Secondary | ICD-10-CM

## 2023-10-01 DIAGNOSIS — R278 Other lack of coordination: Secondary | ICD-10-CM

## 2023-10-01 DIAGNOSIS — M6281 Muscle weakness (generalized): Secondary | ICD-10-CM

## 2023-10-01 DIAGNOSIS — R41841 Cognitive communication deficit: Secondary | ICD-10-CM | POA: Diagnosis not present

## 2023-10-01 DIAGNOSIS — R2681 Unsteadiness on feet: Secondary | ICD-10-CM

## 2023-10-01 DIAGNOSIS — R262 Difficulty in walking, not elsewhere classified: Secondary | ICD-10-CM

## 2023-10-01 DIAGNOSIS — R269 Unspecified abnormalities of gait and mobility: Secondary | ICD-10-CM

## 2023-10-01 NOTE — Therapy (Unsigned)
 OUTPATIENT PHYSICAL THERAPY NEURO TREATMENT   Patient Name: [name]. MRN: [id number] DOB:[birth year], 73 y.o., male Today's Date: 10/01/2023   PCP: Douglas Remak, MD  REFERRING PROVIDER:    Pegge Toribio PARAS, PA-C    END OF SESSION:    PT End of Session - 10/01/23 1501     Visit Number 13    Number of Visits 24    Date for PT Re-Evaluation 11/12/23    PT Start Time 1447    PT Stop Time 1527    PT Time Calculation (min) 40 min    Equipment Utilized During Treatment Gait belt    Activity Tolerance Patient tolerated treatment well    Behavior During Therapy WFL for tasks assessed/performed            Past Medical History:  Diagnosis Date   Anemia    Cervical myelopathy (HCC)    Chronic venous insufficiency    [name] (chronic kidney disease), stage III (HCC)    Diabetes mellitus without complication (HCC)    GERD (gastroesophageal reflux disease)    Hypercholesteremia    Hypothyroidism    Kidney stones    about 50 in the past--last in March 4/24   Leg weakness, bilateral    due to Myasthenia Gravis   Myasthenia gravis (HCC)    Spinal stenosis    Vitamin B 12 deficiency    Vitamin B 12 deficiency    Wears hearing aid    bilateral   Past Surgical History:  Procedure Laterality Date   BACK SURGERY  09/2015   CARDIAC CATHETERIZATION     CATARACT EXTRACTION W/ INTRAOCULAR LENS IMPLANT     CATARACT EXTRACTION W/PHACO Right 12/21/2015   Procedure: CATARACT EXTRACTION PHACO AND INTRAOCULAR LENS PLACEMENT (IOC);  Surgeon: Douglas Etienne, MD;  Location: [facility];  Service: Ophthalmology;  Laterality: Right;  DIABETIC - insulin  and oral meds   COLONOSCOPY     SHOULDER SURGERY     labrum repair   TONSILLECTOMY     Patient Active Problem List   Diagnosis Date Noted   High blood pressure 08/13/2023   Anemia 08/13/2023   Constipation 08/13/2023   Myasthenia gravis (HCC) 08/13/2023   Acute ischemic right MCA stroke (HCC) 07/19/2023    Lymphedema 12/27/2016   Leg pain 07/15/2016   Chronic venous insufficiency 07/15/2016   Swelling of limb 07/15/2016   Type 2 diabetes mellitus (HCC) 07/15/2016   Hyperlipidemia 07/15/2016    ONSET DATE:   REFERRING DIAG:  Diagnosis  I63.511 (ICD-10-CM) - Cerebral infarction due to unspecified occlusion or stenosis of right middle cerebral artery    THERAPY DIAG:  Muscle weakness (generalized)  Other lack of coordination  Hemiplegia and hemiparesis following cerebral infarction affecting left non-dominant side (HCC)  Difficulty in walking, not elsewhere classified  Unsteadiness on feet  Abnormality of gait and mobility  Rationale for Evaluation and Treatment: Rehabilitation  SUBJECTIVE:  SUBJECTIVE STATEMENT:   Pt reports that he is doing well. BUT pt also Reports that he slipped and fell on Friday (7/4) night while getting in bed. States that he was not wearing shoes or grip socks with fall occurred. Required Fire department  assist to recover from fall. States that he Fire department pulled on the LUE to assist in fall recovery. States that he heard a pop and has had pain since in UT region and sternoclavicular joint.  Xrays were obtained, and no fx noted. States that pain is the worse with in dependent position.   Pt reports his goal is to learn to walk again if at all possible, even if it is only 10 feet, and he wants to focus on walking during every PT session.     From Initial Eval: Pt reports that he is doing well. Has not been moving much since discharging from [facility], as therapists instructed pt to perform transfers only at home due to high fall risk with ambulation. Continues to have significant weakenss in the LUE with difficulty extending fingers as well as numbness and inattention  to the L side of body resulting in multiple cuts on Arm and leg with WC mobility in home.  Will be attaining custom power WC from [employer]. Has loaner currently.   Pt accompanied by: significant other [name]   PERTINENT HISTORY: [name]. is a 73 y.o. male with history of T2DM, HTN, [name], renal calculi, seropositive myasthenia gravis who was admitted to [facility] on 07/14/2023 with reports 3 to 4-day history of stuttering speech and fluctuating LUE weakness followed by fall.  He reported some double vision and per discussion with his neurology I had increased prednisone  to 10 mg/day due to concerns of MG flare.  He was found to have left facial weakness with dysarthria as well as LUE and LLE weakness.  MRI brain done revealing acute right MCA infarction involving right periventricular white matter and extending into right frontal and parietal lobes.    [name]. was admitted to rehab 07/19/2023 for inpatient therapies to consist of PT, ST and OT at least three hours five days a week. Past admission physiatrist, therapy team and rehab RN have worked together to provide customized collaborative inpatient rehab.  He continues on low-dose aspirin  and subcu Lovenox  was used for DVT prophylaxis during his stay.  His blood pressures were monitored on TID basis and has been stable on current regimen.  Serial check of CBC shows H&H to be relatively stable stool guaiac was negative x 1.  He continues to have lower extremity edema and was educated on importance of elevation for edema control.  Constipation has resolved with sorbitol  use on intermittent basis.  P.o. intake has been good.  He was noted to have Candida crura's which was treated with course of Diflucan.  MASD has resolved with use of Gerhardt's Butt cream.   Left hemiplegia as been a limiting factor with left knee pain noted with increase in activity.  EKG was added for support and pain management.  His diabetes has been monitored with ac/hs  CBG checks and SSI was use prn for tighter BS control.  Metformin  was resumed at 5 mg twice daily and basal insulin  was slowly titrated upwards to 26 units.  Blood sugars continue to be variable and he was advised to increase metformin  to home dose and titrate insulin  glargine slowly if blood sugars range over 150.  He has made steady gains during  his stay and continues to be limited by mild left inattention with left knee plegia and weakness. He has been evaluated for  speciality wheelchair by [employer]. He will continue to receive follow up outpatient PT, OT and ST at [facility] after discharge.   PAIN:  Are you having pain? No and reports Numbness in the L H and and Leg   PRECAUTIONS: Fall  **reports gets rash from contact with latex  RED FLAGS: Hx of MG   WEIGHT BEARING RESTRICTIONS: No  FALLS: Has patient fallen in last 6 months? Yes. Number of falls 1  LIVING ENVIRONMENT: Lives with: lives with their spouse Lives in: House/apartment Stairs: Ramps installed while in the Hospital  Has following equipment at home: Vannie - 2 wheeled and Wheelchair (power)  PLOF: Independent with basic ADLs, Independent with household mobility without device, and Independent with community mobility with device with [facility]   PATIENT GOALS: relearn to Walk 15-27ft.   OBJECTIVE:  Note: Objective measures were completed at Evaluation unless otherwise noted.  DIAGNOSTIC FINDINGS: MRI brain done revealing acute right MCA infarction involving right periventricular white matter and extending into right frontal and parietal lobes.  COGNITION: Overall cognitive status: Impaired poor awareness of L side while dual tasking.    SENSATION: Light touch: Impaired   COORDINATION: Limited ROM and strength on the LUE limiting finger to nose.  ROM and strength deficits limit ankle to knee.   EDEMA:  Mild edema in the L UE.   MUSCLE TONE: LLE: Mild    POSTURE: flexed trunk. Lateral lean to the L    LOWER EXTREMITY ROM:     Active  Right Eval Left Eval  Hip flexion    Hip extension    Hip abduction    Hip adduction    Hip internal rotation    Hip external rotation    Knee flexion    Knee extension    Ankle dorsiflexion    Ankle plantarflexion    Ankle inversion    Ankle eversion     (Blank rows = not tested)  LOWER EXTREMITY MMT:    MMT Right Eval Left Eval  Hip flexion 4+ 4-  Hip extension 4+ 3+  Hip abduction 4+ 3+  Hip adduction 4+ 3+  Hip internal rotation    Hip external rotation    Knee flexion 5 4-  Knee extension 5 4-  Ankle dorsiflexion 5 4  Ankle plantarflexion    Ankle inversion    Ankle eversion    (Blank rows = not tested)  BED MOBILITY:  Findings: Sit to supine Mod A and Max A Supine to sit Mod A and Max A  TRANSFERS: Sit to stand: Min A and Mod A  Assistive device utilized: Environmental consultant - 2 wheeled     Stand to sit: Min A  Assistive device utilized: Environmental consultant - 2 wheeled     Chair to chair: Min A and Mod A  Assistive device utilized: Environmental consultant - 2 wheeled      PT required to stabilize the LUE on RW as well as provide max cues for attention to hand to prevent sliding off hand grip.  At home Pt utilizes hand splint for stability on RW to prevent loss of grip in transfers.   RAMP:  Not tested  CURB:  Not tested  STAIRS: Not tested GAIT: Findings: Gait Characteristics: step to pattern, decreased hip/knee flexion- Left, Left foot flat, ataxic, lateral lean- Left, wide BOS, and poor foot clearance-  Left, Distance walked: 63ft, Assistive device utilized:Walker - 2 wheeled, Level of assistance: Mod A and Max A, and Comments: poor awareness of lateral/anterior L LOB as well as poor awareness of LUE causing loos of grip on RW and heavy lateral LOB to the L, +2 assist required to prevent fall and position chair under patient as he attempted turn at 43ft.    FUNCTIONAL TESTS:  5 times sit to stand: 1:29 min Timed up and go (TUG): unable to complete turn  at 79ft.  10 meter walk test: unable to complete on this day.   PATIENT SURVEYS:  Stroke Impact Scale 49/80                                                                                                                              TREATMENT DATE: 10/01/2023  Pt arrives to session via transport chair.   Pt wearing L swedish knee cage for therapy. Clinic's RW hand splint arrived and used during session today.  R stand pivot transport chair>EOM using bari-RW with L hand splint and light min A. Significant improvement in transfer balance.   Sit<>stand x 6 with min-mod assist from standard seat height due to  difficulty with anterior weight shift on this day resulting in posterior bias.   Pregait reciprocal stepping x x 10 bil with cues for improved step width. No pain reported in the L shoulder with WB through hand splint on RW.   Gait with PT 33ft x 2; CGA- min assist ovreall with moderate cues for AD management on this day and improved awareness of the LLE. UE supported on RW with L hadn splint and knee cage in place.  Gait training with Wife using RW 2 x 60ft. CGA-Min assist overall from wife and PT with mod assist once in turn to prepare to sit in chair  moderate verbal cues from PT for AD management and awareness of poor step length on the L side. Encouraged Wife to provide verbal, cues and instruction for improved safety, but she only provided minimal instruction.   Seated NMR to improve activation of LLE. LAQ x 10 bil  AAROM hip flexion x 10 bil  Hip adduction/abduction x 10 bil cues for full ROM on the LLE.  LE push down x 12 bil   Stand pivot transfer to transport chair with RW and min assist. Mod assist on the LLE to rotate into chair.  Transported to SLP for speech therapy treatment.   PATIENT EDUCATION: Education details: Pt educated throughout session about proper posture and technique with exercises. Improved exercise technique, movement at target joints, use of target  muscles after min to mod verbal, visual, tactile cues.  Person educated: Patient and Spouse Education method: Explanation, VC/TC/Demo Education comprehension: verbalized understanding  HOME EXERCISE PROGRAM:  Access Code: [email or web address] URL: [email or web address] Date: 09/11/2023 Prepared by: Massie Dollar  Exercises - Seated Knee Extension AROM  - 1 x daily - 4 x weekly -  3 sets - 10 reps - Seated Hip Abduction  - 1 x daily - 4 x weekly - 3 sets - 10 reps - Seated March  - 1 x daily - 4 x weekly - 3 sets - 10 reps - Sit to Stand with Counter Support  - 1 x daily - 4 x weekly - 3 sets - 10 reps - Supine Short Arc Quad  - 1 x daily - 4 x weekly - 3 sets - 10 reps - Supine Hip Abduction  - 1 x daily - 4 x weekly - 3 sets - 10 reps - Supine Bridge  - 1 x daily - 4 x weekly - 3 sets - 3 reps - 2sec  hold  GOALS: Goals reviewed with patient? Yes  SHORT TERM GOALS: Target date: 09/25/2023    Patient will be independent in home exercise program to improve strength/mobility for better functional independence with ADLs. Baseline: Initiated on 09/11/2023 Goal status: IN PROGRESS   LONG TERM GOALS: Target date: 11/13/2023    Patient will increase SIS-16 score to equal to or greater than 10points to demonstrate statistically significant improvement in mobility and quality of life.  Baseline: 49/80 09/19/2023: 45/80 Goal status: IN PROGRESS  2.  Patient (> 65 years old) will complete five times sit to stand test in < 15 seconds indicating an increased LE strength and improved balance. Baseline: 1:69min 09/19/2023: 56.39 seconds using R UE support to push-up from armrest of green chair and requiring skilled min A for balance and lifting/lowering  Goal status: IN PROGRESS  3.  Patient will increase FIST score by > 6 points to demonstrate decreased fall risk during functional activities Baseline:  43/56  09/23/2023: 53/56 Goal status: MET and UPGRADED  09/23/2023: Patient will  increase Berg Balance score to > 45/56 to demonstrate improved balance and decreased fall risk during functional activities and ADLs.  Baseline: 09/23/2023: 14/56 Goal status: IN PROGRESS  4.  Patient will increase 10 meter walk test to >0.80m/s as to improve gait speed for better community ambulation and to reduce fall risk. Baseline: 09/09/2023: 0.103 m/s ( and 37 seconds) using bari-RW with skilled heavy min A and +2 w/c follow for safety  09/19/2023: 0.44m/s using bari-RW with skilled min A of 1 and +2 w/c follow for safety Goal status: IN PROGRESS  5.  Patient will reduce timed up and go to <11 seconds to reduce fall risk and demonstrate improved transfer/gait ability. Baseline: unable to complete turn at 10 ft. Mod-max assist due to poor control of LW on RW, resulting in L lateral LOB  09/09/2023: and 32 seconds using bari-RW with L hand splint and +2 on R side for safety, therapist providing skilled min A on L side (Had pt set-up L hand on RW splint, get L foot in proper positioning, and scoot forward in seat before starting the timer) 09/19/2023: 14min43 seconds using bari-RW with only skilled min A of 1 (Had pt set-up L hand on RW splint, get L foot in proper positioning, and scoot forward in seat before starting the timer) 7/2: 78sec(1:18.125) with RW and min assist overall.  Goal status: IN PROGRESS   ASSESSMENT:  CLINICAL IMPRESSION:   Patient is a 73 y.o. male who was seen today for physical therapy treatment for balance, coordination, and gait deficits following CVA in April. Pt slightly limited due to pain in the L shoulder due to recent fall. Required increased instruction for safety in turns today compared to prior  session with this PT. Continued education for gait with Wife, but before performing gait at home she will need continued practice to notice LOB and how to correct BLE anda RW position to prevent fall to with gait at home.  Pt will continue to benefit from skilled  physical therapy interventions to address above impairments, improve QOL, and attain therapy goals.     OBJECTIVE IMPAIRMENTS: Abnormal gait, cardiopulmonary status limiting activity, decreased activity tolerance, decreased balance, decreased cognition, decreased coordination, decreased endurance, decreased knowledge of condition, decreased knowledge of use of DME, decreased mobility, difficulty walking, decreased ROM, decreased strength, decreased safety awareness, dizziness, hypomobility, increased fascial restrictions, impaired perceived functional ability, increased muscle spasms, impaired sensation, impaired tone, impaired UE functional use, impaired vision/preception, improper body mechanics, postural dysfunction, and obesity.   ACTIVITY LIMITATIONS: carrying, lifting, bending, sitting, standing, squatting, stairs, transfers, bed mobility, bathing, toileting, dressing, reach over head, hygiene/grooming, locomotion level, and caring for others  PARTICIPATION LIMITATIONS: meal prep, cleaning, laundry, medication management, interpersonal relationship, driving, shopping, community activity, and yard work  PERSONAL FACTORS: Age, Past/current experiences, Time since onset of injury/illness/exacerbation, and 3+ comorbidities: MG, HTN, CVA, lymphedema are also affecting patient's functional outcome.   REHAB POTENTIAL: Good  CLINICAL DECISION MAKING: Unstable/unpredictable  EVALUATION COMPLEXITY: High  PLAN:  PT FREQUENCY: 1-2x/week  PT DURATION: 12 weeks  PLANNED INTERVENTIONS: 97164- PT Re-evaluation, 97750- Physical Performance Testing, 97110-Therapeutic exercises, 97530- Therapeutic activity, [id number]- Neuromuscular re-education, 97535- Self Care, [id number]- Manual therapy, [id number]- Gait training, [phone]- Orthotic Initial, [phone]- Orthotic/Prosthetic subsequent, [phone]- Canalith repositioning, [id number]- Electrical stimulation (unattended), [phone]- Electrical stimulation (manual), [id number]- Wound care (first 20  sq cm), 97598- Wound care (each additional 20 sq cm), Patient/Family education, Balance training, Stair training, Taping, Dry Needling, Joint mobilization, Joint manipulation, Vestibular training, Visual/preceptual remediation/compensation, Cognitive remediation, DME instructions, Wheelchair mobility training, Cryotherapy, and Moist heat  PLAN FOR NEXT SESSION:  Review HEP as time allows  Gait training with L hand splint on bari-RW  Continue progression of turning through doorways and turning to sit in chair at end of gait as well as navigating around obstacles Continue reinforcing pt management of L hand placement on/off hand splint during transfers to hopefully reach mod-I stand pivot transfer level at home prior to reaching ambulatory level Family education for gait in simulated home environment.    Massie Dollar PT, DPT  Physical Therapist - [facility]  3:02 PM 10/01/23

## 2023-10-01 NOTE — Therapy (Signed)
 OUTPATIENT SPEECH LANGUAGE PATHOLOGY  COGNITION TREATMENT NOTE   Patient Name: [name]. MRN: [id number] DOB:[birth year], 73 y.o., male Today's Date: 10/01/2023  PCP: Rolin Stank, MD REFERRING PROVIDER: Toribio pitch, PA-C     End of Session - 10/01/23 1525     Visit Number 11    Number of Visits 25    Date for SLP Re-Evaluation 11/12/23    Authorization Type [employer]    Authorization Time Period 08/20/2023 thru 10/15/2023    Authorization - Visit Number 10    Authorization - Number of Visits 16    Progress Note Due on Visit 20    SLP Start Time 1530    SLP Stop Time  1615    SLP Time Calculation (min) 45 min    Activity Tolerance Patient tolerated treatment well          Past Medical History:  Diagnosis Date   Anemia    Cervical myelopathy (HCC)    Chronic venous insufficiency    [name] (chronic kidney disease), stage III (HCC)    Diabetes mellitus without complication (HCC)    GERD (gastroesophageal reflux disease)    Hypercholesteremia    Hypothyroidism    Kidney stones    about 50 in the past--last in March 4/24   Leg weakness, bilateral    due to Myasthenia Gravis   Myasthenia gravis (HCC)    Spinal stenosis    Vitamin B 12 deficiency    Vitamin B 12 deficiency    Wears hearing aid    bilateral   Past Surgical History:  Procedure Laterality Date   BACK SURGERY  09/2015   CARDIAC CATHETERIZATION     CATARACT EXTRACTION W/ INTRAOCULAR LENS IMPLANT     CATARACT EXTRACTION W/PHACO Right 12/21/2015   Procedure: CATARACT EXTRACTION PHACO AND INTRAOCULAR LENS PLACEMENT (IOC);  Surgeon: Dene Etienne, MD;  Location: [facility];  Service: Ophthalmology;  Laterality: Right;  DIABETIC - insulin  and oral meds   COLONOSCOPY     SHOULDER SURGERY     labrum repair   TONSILLECTOMY     Patient Active Problem List   Diagnosis Date Noted   High blood pressure 08/13/2023   Anemia 08/13/2023   Constipation 08/13/2023    Myasthenia gravis (HCC) 08/13/2023   Acute ischemic right MCA stroke (HCC) 07/19/2023   Lymphedema 12/27/2016   Leg pain 07/15/2016   Chronic venous insufficiency 07/15/2016   Swelling of limb 07/15/2016   Type 2 diabetes mellitus (HCC) 07/15/2016   Hyperlipidemia 07/15/2016    ONSET DATE: 07/19/2023   REFERRING DIAG: P36.488 (ICD-10-CM) - Cerebral infarction due to unspecified occlusion or stenosis of right middle cerebral artery  THERAPY DIAG:  Cognitive communication deficit  Rationale for Evaluation and Treatment Rehabilitation  SUBJECTIVE:   PERTINENT HISTORY and DIAGNOSTIC FINDINGS:   [name] is a 73 y.o. 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  CT Brain w/o contrast: 07/14/2023 New hypodensity within the right periventricular white matter extending into the inferior frontal lobe/operculum compatible with a recent infarct.   MRI Brain w/o contrast: 07/15/2023 IMPRESSION:  Acute right MCA distribution infarct involving the right periventricular white matter extending into the right frontal and parietal lobes and insula , some of which correlates to the recent CT finding. No significant associated mass effect or associated hemorrhage.   MRA head  angiogram 07/16/2023 IMPRESSION:  Focal occlusion of the right inferior division distal M2 MCA (6:123) in keeping with known right MCA infarct when compared to prior MRI brain 07/15/2023.   CT head 07/17/2023 IMPRESSION:  Expansion of territory involved in the right MCA infarct compared with MRI of 07/15/2023. Increasing local mass effect with new leftward midline shift of 3 mm. No hemorrhage.   Speech Language Evaluation at [facility] 07/19/2023 Patient presents with cognitive deficits in the areas of problem solving and left  inattention as evidenced throughout patient/family interview and performance on Cognistat evaluation. Patient further demonstrates impairments in the areas of intellectual, anticipatory, and emergent awareness of deficits/mistakes.   Speech Language Evaluation at [facility] 07/31/2023 Skilled therapy session focused on re-evaluation of cognitive skills utilizing the CLQT standardized assessment. Patient scored with overall mild cognitive deficits in the subtests of attention, executive functioning and visual spatial skills. Patient scored WFL on the clock drawing task, language and memory.   PAIN:  Are you having pain? No   FALLS: Has patient fallen in last 6 months?  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 SLP facilitated session by providing the following interventions:  Pt and his daughter report that pt worked on taxes fro a costumer with good ability and good self-awareness when he fat fingered an incorrect keystroke - in addition he started teaching his grand daughter some of the information with functional ability - in additional he stated I typed a full fledge email yesterday.  SLP introduced complex problem solving task (Double Solitaire) to target scanning to left, working memory, higher level problem solving, alternating attention and self-monitoring. With intermittent supervision to Min A cues, pt able  to complete with > 95% accuracy.  Education provided to pt's daughter on ways to facilitate cuing at home when playing game with her or pt's grand daughter.  PATIENT EDUCATION: Education details: see above Person educated: Patient and Spouse Education method: Explanation Education comprehension: needs further education   HOME EXERCISE PROGRAM:   See above for details    GOALS:  Goals reviewed with patient? Yes  SHORT TERM GOALS: Target date: 10 sessions  Updated: 09/25/2023 The patient will complete a simple word search puzzle (66 or smaller) within 10 minutes given intermittent moderate verbal cues. Baseline: Goal status: INITIAL: MET  2.  The patient will recall sentence-level information after a 5-minute delay using the spaced retrieval technique. Baseline:  Goal status: INITIAL: MET  3.  With moderate cues, pt will identify 3 cognitive strengths/weaknesses during tasks.  Baseline:  Goal status: INITIAL: MET  4. Pt will complete semi-complex reasoning task with supervision A to achieve > 90% accuracy.  Baseline:   Goal status: INITIAL  5. Pt will recall 2-3 compensatory memory strategies that he has implemented during functional activity over 3 out of 5 session. Baseline: Goal Status: INITIAL.     LONG TERM GOALS: Target date: 11/12/2023  Updated: 09/25/2023 The patient will read sentence-level information aloud at 80% accuracy given intermittent minimal  verbal cues in order to increase ability to safely live independently. Baseline:  Goal status: INITIAL: MET  2.   With Min A, patient will use external aid for functional recall of completed/upcoming activities 75% accuracy.     Baseline:  Goal status: INITIAL: MET  3.   With Min A, patient will complete semi-complex visual memory tasks with 75% accuracy independently.   Baseline:  Goal status: INITIAL: MET  4.  With Supervision A, patient will use strategies (ie., white board, daily planner/calendar, Apps on  phone) to improve memory for important information with 80% accuracy.  Baseline: Goal status: INITIAL    ASSESSMENT:  CLINICAL IMPRESSION: Patient is a 73 y.o. right handed male who was seen today for a cognitive communication treatment d/t right MCA CVA. Pt presents with overall  moderate cognitive impairment with severe deficits in delayed memory, severe deficits in visuospatial/constructional abilities (apart from left inattention), decreased awareness of deficits/errors and mild deficits in higher level attention. Immediate memory, language and selective attention are relative strengths for pt.   Pt continues with improved abilities within ST sessions as well as report of improved ability within functional tasks at home.  See the above treatment note for details.   OBJECTIVE IMPAIRMENTS include attention, memory, awareness, and executive functioning. These impairments are limiting patient from return to work, managing medications, managing appointments, managing finances, household responsibilities, and ADLs/IADLs. Factors affecting potential to achieve goals and functional outcome are severity of impairments. Patient will benefit from skilled SLP services to address above impairments and improve overall function.  REHAB POTENTIAL: Good  PLAN: SLP FREQUENCY: 1-2x/week  SLP DURATION: 12 weeks  PLANNED INTERVENTIONS: Cognitive reorganization, Internal/external aids, Functional tasks, SLP instruction and feedback, Compensatory strategies, and Patient/family education   Itzia Cunliffe B. Rubbie, M.S., CCC-SLP, Tree surgeon Certified Brain Injury Specialist [facility]  [facility] Rehabilitation Services Office [phone] Ascom [phone] Fax [phone]

## 2023-10-01 NOTE — Therapy (Cosign Needed)
 Occupational Therapy NEURO TREATMENT NOTE  Patient Name: [name]. MRN: [id number] DOB:Dec 13, 1950, 73 y.o., male Today's Date: 10/01/2023  PCP: Alm Rower, MD REFERRING PROVIDER: Hermann Toribio PARAS, PA-C  END OF SESSION:  OT End of Session - 10/01/23 1745     Visit Number 13    Number of Visits 24    Date for OT Re-Evaluation 11/12/23    OT Start Time 1613    OT Stop Time 1700    OT Time Calculation (min) 47 min    Activity Tolerance Patient tolerated treatment well    Behavior During Therapy WFL for tasks assessed/performed          Past Medical History:  Diagnosis Date   Anemia    Cervical myelopathy (HCC)    Chronic venous insufficiency    [name] (chronic kidney disease), stage III (HCC)    Diabetes mellitus without complication (HCC)    GERD (gastroesophageal reflux disease)    Hypercholesteremia    Hypothyroidism    Kidney stones    about 50 in the past--last in March 4/24   Leg weakness, bilateral    due to Myasthenia Gravis   Myasthenia gravis (HCC)    Spinal stenosis    Vitamin B 12 deficiency    Vitamin B 12 deficiency    Wears hearing aid    bilateral   Past Surgical History:  Procedure Laterality Date   BACK SURGERY  09/2015   CARDIAC CATHETERIZATION     CATARACT EXTRACTION W/ INTRAOCULAR LENS IMPLANT     CATARACT EXTRACTION W/PHACO Right 12/21/2015   Procedure: CATARACT EXTRACTION PHACO AND INTRAOCULAR LENS PLACEMENT (IOC);  Surgeon: Dene Etienne, MD;  Location: [facility];  Service: Ophthalmology;  Laterality: Right;  DIABETIC - insulin  and oral meds   COLONOSCOPY     SHOULDER SURGERY     labrum repair   TONSILLECTOMY     Patient Active Problem List   Diagnosis Date Noted   High blood pressure 08/13/2023   Anemia 08/13/2023   Constipation 08/13/2023   Myasthenia gravis (HCC) 08/13/2023   Acute ischemic right MCA stroke (HCC) 07/19/2023   Lymphedema 12/27/2016   Leg pain 07/15/2016   Chronic venous  insufficiency 07/15/2016   Swelling of limb 07/15/2016   Type 2 diabetes mellitus (HCC) 07/15/2016   Hyperlipidemia 07/15/2016   ONSET DATE: 07/13/2023  REFERRING DIAG: Acute ischemic R MCA CVA  THERAPY DIAG: muscle weakness (generalized), other lack of coordination, vision disturbance, hemiplegia and hemiparesis following cerebral infarction affecting left non-dominant side (HCC)  Rationale for Evaluation and Treatment: Rehabilitation  SUBJECTIVE:  SUBJECTIVE STATEMENT: Pt Reports 7/10 pain in the L shoulder due to most recent fall this past Friday. Pt accompanied by: significant other  PERTINENT HISTORY: Pt. Was admitted to [facility] on 07/13/2023 after sustaining a CVA while on a trip to the beach. Pt. Was diagnosed with R MCA CVA. Pt. Was admitted to inpatient rehab from 07/19/2023-08/13/2023. Past medical history includes high BP, anemia, and Myasthenia Gravis.   PRECAUTIONS: None  WEIGHT BEARING RESTRICTIONS: No  PAIN:  Are you having pain?  10/01/2023- Yes, 7/10 in L shoulder due to most recent fall that occurred this past Friday. 5/10 L wrist    FALLS: Has patient fallen in last 6 months? Yes  LIVING ENVIRONMENT: Lives with: lives with their family and lives with their spouse-  Lives in: House/apartment- 2 story home and resides mostly on the 1st floor Stairs: No- Ramped entrance Has following equipment at home:  Walker, cane, Steadi, shower chair, handheld shower head, bedside commode  PLOF: Independent and Independent with basic ADLs Independent at home   PATIENT GOALS: Would like to walk 10-20 feet each time.   OBJECTIVE:  Note: Objective measures were completed at Evaluation unless otherwise noted.  HAND DOMINANCE: Right  ADLs: Overall ADLs:  Transfers/ambulation related to ADLs: Eating: Independent, 100% R handed Grooming: independent UB Dressing: Can put on shirt independently LB Dressing: Difficulty with pants and requires assistance getting the pants on  his feet, has difficulty putting on shoes and socks Toileting: Tot A for toilet hygiene. Uses Steadi during toilet transfers. Bathing: Requires assist with using the L arm to wash the R arm. Tub Shower transfers: Roll in shower, built in shower chair, grab bars and hand held shower head.  IADLs: Shopping: Total A (Wife typically did the shopping prior to onset)   Light housekeeping: Total A (wife typically performs prior to onset) Meal Prep: Independent with light snack prep Community mobility: No driving, able to get in/out of the car Medication management: Set up assistance from his wife using a pill box (pt. Is responsible for taking medications at the correct time) Financial management: family members check behind him. Handwriting: TBD Hobbies: gardening, wood working and sports- [employer] Work history: Owns a tax business MOBILITY STATUS: Needs Assist:    POSTURE COMMENTS:   Sitting balance: Good supported sitting balance  FUNCTIONAL OUTCOME MEASURES: TBD   UPPER EXTREMITY ROM:    Active ROM Right Eval  [facility] Right 09/23/2023 [facility] Left eval Left 09/23/2023  Shoulder flexion   89(110) 109(114)  Shoulder abduction   98(120) 109(120)  Shoulder adduction      Shoulder extension      Shoulder internal rotation      Shoulder external rotation      Elbow flexion   120(140) 130(136)  Elbow extension   -28(-10) -5(0)  Wrist flexion   60(60) Rest at 60 50(65)  Wrist extension   -48(42) 10(40)  Wrist ulnar deviation      Wrist radial deviation      Wrist pronation      Wrist supination      (Blank rows = not tested)  Eval: L Digit flexion: 2nd:3cm (0cm) 3rd: 0cm (0cm), 4th: 0cm (0cm), 5th: 2cm (0cm)  09/23/23:  L Digit flexion to [facility]: 2nd: 3.5cm (0cm), 3rd: 4 cm(0cm), 4th 3.5cm (0cm), 5th: 2cm (0cm)  Eval:  L Digit extension: Active Gross digit extension through 10% of ROM  09/23/23:  L Digit extension: Active Gross digit extension through 50% of ROM   UPPER  EXTREMITY MMT:     MMT Right eval Left eval Left 09/23/2023  Shoulder flexion 5 3- 3-/5  Shoulder abduction 5 3- 3-/5  Shoulder adduction     Shoulder extension     Shoulder internal rotation     Shoulder external rotation     Middle trapezius     Lower trapezius     Elbow flexion 5 TBD 3+/5  Elbow extension 5 TBD 4-/5  Wrist flexion   2/5  Wrist extension  2- 2/5  Wrist ulnar deviation     Wrist radial deviation     Wrist pronation     Wrist supination     (Blank rows = not tested)  HAND FUNCTION:   Grip strength:   Right: 94#, Left: 1#,   Lateral Key Pinch strength:   Right: 14#, Left: 3#  COORDINATION:  Box and Blocks: Right: 34 blocks completed  in 1 min., Left: 0 blocks completed in 1 min.   SENSATION: Sensation feels different in both hands.  L hand Light touch- impaired L hand Proprioception- impaired  EDEMA: Has had hx of edema, and came to Eval session with swelling in the L hand/wrist/forearm.   *pt. Has a laceration from his dog on the dorsal aspect of the L hand.   MUSCLE TONE: Increased tone through the UE and hand flexors.   COGNITION: Overall cognitive status: Within functional limits for tasks assessed  VISION: Subjective report: L sided inattention Baseline vision: Wears glasses for reading only Visual history: Right visual occlusion  VISION ASSESSMENT: To be assessed  PERCEPTION: TBD  PRAXIS: Impaired                                                                                                                 TREATMENT DATE: 09/2023  Manual Therapy:  -Pt. Tolerated carpal rolls, and metacarpal spread stretches on the LUE.  Pt. tolerated soft tissue massage to the volar surface of the L forearm to decrease tightness. Manual therapy was performed independent of, and in preparation for ROM, and therapeutic ex.   -Pt. Tolerated retrograde massage to the Left hand for edema control 2/2 increased edema.   Neuro re-ed: -Pt. Performed  [facility] skills grasping 1/2 cubes, progressing to 1/2 circular pegs, and 1 1/2 [place]  discs from OTS hand, while releasing objects in wrist extension on floor level. -To further challenge the task, Pt. Grasped and manipulated objects from flat tabletop surface, following with releasing objects into container to promote shoulder abduction, wrist extension and thumb adduction/abduction.   -Pt. Progressed to grasping 2 cubes from flat tabletop surface to release and discard cubes into container positioned to the left side of the L hand, to promote sustained lateral pinch and ulnar deviation.     PATIENT EDUCATION: Education details:  Wrist strengthening Person educated: Patient and daughter Education method: Explanation, Demonstration, Tactile cues, and Verbal cues, visual handout Education comprehension: verbalized understanding, returned demonstration, and needs further education  HOME EXERCISE PROGRAM: Self passive stretching throughout the LUE; grasp/release activities, wrist strengthening   GOALS: Goals reviewed with patient? Yes  SHORT TERM GOALS: Target date: 10/01/2023   Pt. Will be independent with HEP for the LUE.  Baseline: 09/19/23: Requires assist from his wife Eval; no current HEP Goal status: Ongoing   LONG TERM GOALS: Target date: 11/12/2023  Pt. Will increase L shoulder flexion by 10 degrees to be able to reach up to shelves.  Baseline: 09/19/23: Pt. Continues to be limited with reaching up to shelves. Eval: L shoulder flexion is 89 (110). Goal status: Ongoing  2.  Pt. Will increase L shoulder abduction by 10 degrees to assist with underarm hygiene.  Baseline: 09/19/23: Pt. Continues to require assist with underarm hygiene.Eval: L shoulder abduction is 98 (120).  Goal status: Ongoing  3.  Pt. Will improve L wrist extension by 10 degrees to be able to initiate anticipation of grasping for objects.  Baseline: 09/19/23: Consistent  activation noted in the left wrist  extensors with facilitation. Eval: -48 (42) Goal status: Ongoing  4.  Pt. Will improve active gross digit extension through 50% of the range to be able to release objects in his hand consistently.  Baseline: 09/19/23: gross digit extension noted through 25% range with facilitation.Eval: gross digit extension is 10% of the range  Goal status: Goal updated to 50% of the range  5.  Pt. Will independently demonstrate visual compensatory strategies when navigating through environment and during tabletop tasks. Baseline: 09/19/23: Pt. Requires fewer cues for left sided awareness Eval: Pt. Requires consistent cuing for L sided awareness.  Goal status: Ongoing  ASSESSMENT:  CLINICAL IMPRESSION: Pt. Reports a most recent fall this past Friday, injuring his L shoulder and limiting his shoulder flexion ROM. Pt. Indicated 7/10 pain in the L shoulder. Pt. Arrived to therapy today with multiple skin tears in the L forearm. Pt. Has difficulty with maintaining a lateral grasp on circular and cubes objects. However, he has improved with grasping 2 slick cubes from tabletop surface. Pt. Presents with increased AROM during shoulder flexion, elbow extension wrist extension, and thumb adduction/abduction while performing functional reaching task, and [facility] tasks. Pt. Tolerated soft tissue massage and retrograde massage well today.  Pt continues to benefit from OT services to work on improving LUE functioning to increase engagement of the LUE during daily task, and to provide education about compensatory strategies during ADLs/IADLs.  PERFORMANCE DEFICITS: in functional skills including ADLs, IADLs, coordination, dexterity, proprioception, sensation, edema, tone, ROM, strength, pain, Fine motor control, Gross motor control, mobility, balance, endurance, vision, and UE functional use, cognitive skills including perception, and psychosocial skills including coping strategies, environmental adaptation, and routines and  behaviors.   IMPAIRMENTS: are limiting patient from ADLs, IADLs, and leisure.   CO-MORBIDITIES: may have co-morbidities  that affects occupational performance. Patient will benefit from skilled OT to address above impairments and improve overall function.  MODIFICATION OR ASSISTANCE TO COMPLETE EVALUATION: Min-Moderate modification of tasks or assist with assess necessary to complete an evaluation.  OT OCCUPATIONAL PROFILE AND HISTORY: Detailed assessment: Review of records and additional review of physical, cognitive, psychosocial history related to current functional performance.  CLINICAL DECISION MAKING: Moderate - several treatment options, min-mod task modification necessary  REHAB POTENTIAL: Good  EVALUATION COMPLEXITY: Moderate    PLAN:  OT FREQUENCY: 2x/week  OT DURATION: 12 weeks  PLANNED INTERVENTIONS: 97168 OT Re-evaluation, 97535 self care/ADL training, 02889 therapeutic exercise, 97530 therapeutic activity, 97112 neuromuscular re-education, 97140 manual therapy, 97018 paraffin, 02989 moist heat, 97010 cryotherapy, 97034 contrast bath, 97760 Orthotic Initial, 97763 Orthotic/Prosthetic subsequent, passive range of motion, visual/perceptual remediation/compensation, energy conservation, and DME and/or AE instructions  RECOMMENDED OTHER SERVICES: PT and ST  CONSULTED AND AGREED WITH PLAN OF CARE: Patient and family member/caregiver  PLAN FOR NEXT SESSION: see above  Damien Nap, OTS   10/01/2023

## 2023-10-02 ENCOUNTER — Encounter: Payer: Self-pay | Admitting: Registered Nurse

## 2023-10-02 ENCOUNTER — Encounter: Attending: Registered Nurse | Admitting: Registered Nurse

## 2023-10-02 VITALS — BP 153/75 | HR 68 | Ht 68.0 in | Wt 265.0 lb

## 2023-10-02 DIAGNOSIS — I69354 Hemiplegia and hemiparesis following cerebral infarction affecting left non-dominant side: Secondary | ICD-10-CM | POA: Diagnosis present

## 2023-10-02 DIAGNOSIS — I63511 Cerebral infarction due to unspecified occlusion or stenosis of right middle cerebral artery: Secondary | ICD-10-CM | POA: Insufficient documentation

## 2023-10-02 NOTE — Progress Notes (Signed)
 Subjective:    Patient ID: Douglas LITTIE [name]., male    DOB: 02-Mar-1951, 73 y.o.   MRN: [id number]  HPI: Douglas Wolgamott. is a 73 y.o. 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 [name] was seen by Physical Therapist at [facility] while on Inpatient Rehabilitation, the recommendation is as follow, [employer] representative was present at the evaluation.  [name], ATP present for custom manual wheelchair evaluation.  Per Inpatient Physical Therapist: Recommendations and discussed with Dr Carilyn and he agrees with recommendations.   Therapist, patient, pt's wife, and ATP discussed the following necessities for pt's custom wheelchair:   decision to select power mobility base with power tilt and elevating options d/t pt's recent stroke in addition to hx of above mentioned comorbidities and pt's desire for increased independence inside as well as over yard outdoors with minimal assistance from wife in order to maintain participation in gardening and farming activities within his abilities.   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 ATP planning to provide loaner  PWC Wednesday 5/14 at 2pm.   [name] has the Physical and Mental ability to operate the Power Wheelchair. The Power Wheelchair will be great asset for [name]. This will help with his mobility in his home, also improving his independence with activities of daily living, such as bathing and dressing.   He states he has pain in his left shoulder. He rates his pain 7.  Wife in the room.   Pain Inventory Average Pain 7 Pain Right Now 7 My pain is aching  In the last 24 hours, has pain interfered with the following? General activity 4 Relation with others 3 Enjoyment of life 4 What TIME of day is your pain at its worst? morning  Sleep (in general) Fair  Pain is worse with: some activites Pain improves with: heat/ice Relief from Meds: na  Family History  Problem Relation Age of Onset   Clotting disorder Father    Heart attack Father    Social History   Socioeconomic History   Marital status: Married    Spouse name: Not on file   Number of children: Not on file   Years of education: Not on file   Highest education level: Not on file  Occupational History   Not on file  Tobacco Use   Smoking status: Never   Smokeless tobacco: Never  Substance and Sexual Activity   Alcohol use: No   Drug use: No   Sexual activity: Not on file  Other Topics Concern   Not on file  Social History Narrative  Not on file   Social Drivers of Health   Financial Resource Strain: Low Risk  (07/15/2023)   Received [facility]   Overall Financial Resource Strain (CARDIA)    Difficulty of Paying Living Expenses: Not hard at all  Food Insecurity: No Food Insecurity (07/15/2023)   Received from [facility]   Hunger Vital Sign    Within the past 12 months, you worried that your food would run out before you got the money to buy more.: Never true    Within the past 12 months, the food you bought just didn't last and you didn't have money to get more.:  Never true  Transportation Needs: No Transportation Needs (07/15/2023)   Received from [facility]    In the past 12 months, has lack of transportation kept you from medical appointments or from getting medications?: No    Lack of Transportation (Non-Medical): No  Physical Activity: Not on file  Stress: Not on file  Social Connections: Unknown (08/07/2021)   Received from [facility]   Social Network    Social Network: Not on file   Past Surgical History:  Procedure Laterality Date   BACK SURGERY  09/2015   CARDIAC CATHETERIZATION     CATARACT EXTRACTION W/ INTRAOCULAR LENS IMPLANT     CATARACT EXTRACTION W/PHACO Right 12/21/2015   Procedure: CATARACT EXTRACTION PHACO AND INTRAOCULAR LENS PLACEMENT (IOC);  Surgeon: Dene Etienne, MD;  Location: [facility];  Service: Ophthalmology;  Laterality: Right;  DIABETIC - insulin  and oral meds   COLONOSCOPY     SHOULDER SURGERY     labrum repair   TONSILLECTOMY     Past Surgical History:  Procedure Laterality Date   BACK SURGERY  09/2015   CARDIAC CATHETERIZATION     CATARACT EXTRACTION W/ INTRAOCULAR LENS IMPLANT     CATARACT EXTRACTION W/PHACO Right 12/21/2015   Procedure: CATARACT EXTRACTION PHACO AND INTRAOCULAR LENS PLACEMENT (IOC);  Surgeon: Dene Etienne, MD;  Location: [facility];  Service: Ophthalmology;  Laterality: Right;  DIABETIC - insulin  and oral meds   COLONOSCOPY     SHOULDER SURGERY     labrum repair   TONSILLECTOMY     Past Medical History:  Diagnosis Date   Anemia    Cervical myelopathy (HCC)    Chronic venous insufficiency    [name] (chronic kidney disease), stage III (HCC)    Diabetes mellitus without complication (HCC)    GERD (gastroesophageal reflux disease)    Hypercholesteremia    Hypothyroidism    Kidney stones    about 50 in the past--last in March 4/24   Leg weakness, bilateral    due to Myasthenia Gravis   Myasthenia gravis  (HCC)    Spinal stenosis    Vitamin B 12 deficiency    Vitamin B 12 deficiency    Wears hearing aid    bilateral   BP (!) 153/75   Pulse 68   Ht 5' 8 (1.727 m)   Wt 265 lb (120.2 kg) Comment: reported  SpO2 95%   BMI 40.29 kg/m   Opioid Risk Score:   Fall Risk Score:  `1  Depression screen [facility] 2/9     09/02/2023    1:43 PM  Depression screen PHQ 2/9  Decreased Interest 0  Down, Depressed, Hopeless 0  PHQ - 2 Score 0  Altered sleeping 0  Tired, decreased energy 0  Change in appetite 0  Feeling  bad or failure about yourself  2  Trouble concentrating 0  Moving slowly or fidgety/restless 0  Suicidal thoughts 0  PHQ-9 Score 2    Review of Systems  Musculoskeletal:  Positive for gait problem.  All other systems reviewed and are negative.      Objective:   Physical Exam Vitals and nursing note reviewed.  Constitutional:      Appearance: Normal appearance.  Cardiovascular:     Rate and Rhythm: Normal rate and regular rhythm.     Pulses: Normal pulses.     Heart sounds: Normal heart sounds.  Pulmonary:     Effort: Pulmonary effort is normal.     Breath sounds: Normal breath sounds.  Musculoskeletal:     Comments: Normal Muscle Bulk and Muscle Testing Reveals:  Upper Extremities: Right: Full ROM and Muscle Strength  5/5 Left upper extremity: Decreased ROM 90 Degrees and Muscle Strength 0/5 Lower Extremities: Right: Full ROM and Muscle Strength 5/5 Left Lower extremity, Decreased ROM, arrived in wheelchair Left lower extremity dressing intact.  Arrived in wheelchair     Skin:    General: Skin is warm and dry.     Comments: Left lower extremity dressing intact: PCP following  Neurological:     Mental Status: He is alert and oriented to person, place, and time.  Psychiatric:        Mood and Affect: Mood normal.        Behavior: Behavior normal.           Assessment & Plan:  Acute Ischemic Right MCA Stroke: Continue Outpatient rehabilitation at [facility]. He has a scheduled appointment with Dr Carilyn.   F/U with Dr Carilyn

## 2023-10-04 ENCOUNTER — Ambulatory Visit

## 2023-10-04 ENCOUNTER — Ambulatory Visit: Admitting: Physical Therapy

## 2023-10-04 ENCOUNTER — Ambulatory Visit: Admitting: Speech Pathology

## 2023-10-04 DIAGNOSIS — R41841 Cognitive communication deficit: Secondary | ICD-10-CM | POA: Diagnosis not present

## 2023-10-04 DIAGNOSIS — I69354 Hemiplegia and hemiparesis following cerebral infarction affecting left non-dominant side: Secondary | ICD-10-CM

## 2023-10-04 DIAGNOSIS — M6281 Muscle weakness (generalized): Secondary | ICD-10-CM

## 2023-10-04 DIAGNOSIS — R2681 Unsteadiness on feet: Secondary | ICD-10-CM

## 2023-10-04 DIAGNOSIS — R278 Other lack of coordination: Secondary | ICD-10-CM

## 2023-10-04 DIAGNOSIS — R262 Difficulty in walking, not elsewhere classified: Secondary | ICD-10-CM

## 2023-10-04 DIAGNOSIS — H539 Unspecified visual disturbance: Secondary | ICD-10-CM

## 2023-10-04 NOTE — Therapy (Signed)
 OUTPATIENT SPEECH LANGUAGE PATHOLOGY  COGNITION TREATMENT NOTE   Patient Name: [name]. MRN: [id number] DOB:03-Apr-1950, 73 y.o., male Today's Date: 10/07/2023  PCP: Douglas Stank, MD REFERRING PROVIDER: Toribio pitch, PA-C      End of Session - 10/04/23 1514     Visit Number 12    Number of Visits 25    Date for SLP Re-Evaluation 11/12/23    Authorization Type [employer]    Authorization Time Period 08/20/2023 thru 10/15/2023    Authorization - Visit Number 11    Authorization - Number of Visits 16    Progress Note Due on Visit 20    SLP Start Time 0845    SLP Stop Time  0930    SLP Time Calculation (min) 45 min    Activity Tolerance Patient tolerated treatment well            Past Medical History:  Diagnosis Date   Anemia    Cervical myelopathy (HCC)    Chronic venous insufficiency    [name] (chronic kidney disease), stage III (HCC)    Diabetes mellitus without complication (HCC)    GERD (gastroesophageal reflux disease)    Hypercholesteremia    Hypothyroidism    Kidney stones    about 50 in the past--last in March 4/24   Leg weakness, bilateral    due to Myasthenia Gravis   Myasthenia gravis (HCC)    Spinal stenosis    Vitamin B 12 deficiency    Vitamin B 12 deficiency    Wears hearing aid    bilateral   Past Surgical History:  Procedure Laterality Date   BACK SURGERY  09/2015   CARDIAC CATHETERIZATION     CATARACT EXTRACTION W/ INTRAOCULAR LENS IMPLANT     CATARACT EXTRACTION W/PHACO Right 12/21/2015   Procedure: CATARACT EXTRACTION PHACO AND INTRAOCULAR LENS PLACEMENT (IOC);  Surgeon: Douglas Etienne, MD;  Location: [facility];  Service: Ophthalmology;  Laterality: Right;  DIABETIC - insulin  and oral meds   COLONOSCOPY     SHOULDER SURGERY     labrum repair   TONSILLECTOMY     Patient Active Problem List   Diagnosis Date Noted   High blood pressure 08/13/2023   Anemia 08/13/2023   Constipation  08/13/2023   Myasthenia gravis (HCC) 08/13/2023   Acute ischemic right MCA stroke (HCC) 07/19/2023   Lymphedema 12/27/2016   Leg pain 07/15/2016   Chronic venous insufficiency 07/15/2016   Swelling of limb 07/15/2016   Type 2 diabetes mellitus (HCC) 07/15/2016   Hyperlipidemia 07/15/2016    ONSET DATE: 07/19/2023   REFERRING DIAG: P36.488 (ICD-10-CM) - Cerebral infarction due to unspecified occlusion or stenosis of right middle cerebral artery  THERAPY DIAG:  Cognitive communication deficit  Rationale for Evaluation and Treatment Rehabilitation  SUBJECTIVE:   PERTINENT HISTORY and DIAGNOSTIC FINDINGS:   [name] is a 73 y.o. 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  CT Brain w/o contrast: 07/14/2023 New hypodensity within the right periventricular white matter extending into the inferior frontal lobe/operculum compatible with a recent infarct.   MRI Brain w/o contrast: 07/15/2023 IMPRESSION:  Acute right MCA distribution infarct involving the right periventricular white matter extending into the right frontal and parietal lobes and insula , some of which correlates to the recent CT finding. No significant associated mass effect or associated hemorrhage.  MRA head angiogram 07/16/2023 IMPRESSION:  Focal occlusion of the right inferior division distal M2 MCA (6:123) in keeping with known right MCA infarct when compared to prior MRI brain 07/15/2023.   CT head 07/17/2023 IMPRESSION:  Expansion of territory involved in the right MCA infarct compared with MRI of 07/15/2023. Increasing local mass effect with new leftward midline shift of 3 mm. No hemorrhage.   Speech Language Evaluation at [facility] 07/19/2023 Patient presents with cognitive deficits in the areas of problem  solving and left inattention as evidenced throughout patient/family interview and performance on Cognistat evaluation. Patient further demonstrates impairments in the areas of intellectual, anticipatory, and emergent awareness of deficits/mistakes.   Speech Language Evaluation at [facility] 07/31/2023 Skilled therapy session focused on re-evaluation of cognitive skills utilizing the CLQT standardized assessment. Patient scored with overall mild cognitive deficits in the subtests of attention, executive functioning and visual spatial skills. Patient scored WFL on the clock drawing task, language and memory.   PAIN:  Are you having pain? No   FALLS: Has patient fallen in last 6 months?  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 SLP facilitated session by providing the following interventions:  TRAIL MAKING TEST (TMT) Parts A & B Both parts of the Trail Making Test consist of 25 circles distributed over a sheet of paper. In Part A, the circles are numbered 1 - 25, and the patient should draw lines to connect the numbers in ascending order. In Part B, the circles include both numbers (1 - 13) and letters (A - L); as in Part A, the patient draws lines to connect the circles in an ascending pattern, but with the added task of alternating between the numbers and letters (i.e., 1-A-2-B-3-C, etc.).   Results for both TMT A and B are reported  as the number of seconds required to complete the task; therefore, higher scores reveal greater impairment.  Results:  Trail A - WNL - 80 seconds (n=29 seconds; Deficient > 78 seconds) Trail B - WNL - 210 seconds (n= 75 seconds; Deficient > 273 seconds)  TMTs have been shown to be significantly correlated with impaired driving on road tests by older drivers. TMT-A provided the best utility for determining a range of scores (68-90 sec) for which additional road testing would be indicated in general practice settings.(Papandonatos GD, Layla CAVES, [address], [place], [place]. Clinical Utility of the Trail-Making Test as a Predictor of Driving Performance in Older Adults. J Am Geriatr Soc. 2015 Nov;63(11):2358-64. doi: 10.1111/jgs.13776. Epub 2015 Oct 27. PMID: 73496376; PMCID: [id number].)  Pt with great questions regarding progress. 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 His wife also verifies improved ability level.   PATIENT EDUCATION: Education details: see above Person educated: Patient and Spouse Education method: Explanation Education comprehension: needs further education   HOME EXERCISE PROGRAM:   See above for details    GOALS:  Goals reviewed with patient? Yes  SHORT TERM GOALS: Target date:  10 sessions  Updated: 09/25/2023 The patient will complete a simple word search puzzle (66 or smaller) within 10 minutes given intermittent moderate verbal cues. Baseline: Goal status: INITIAL: MET  2.  The patient will recall sentence-level information after a 5-minute delay using the spaced retrieval technique. Baseline:  Goal status: INITIAL: MET  3.  With moderate cues, pt will identify 3 cognitive strengths/weaknesses during tasks.  Baseline:  Goal status: INITIAL: MET  4. Pt will complete semi-complex reasoning task with supervision A to achieve > 90% accuracy.  Baseline:   Goal status: INITIAL  5. Pt will recall 2-3 compensatory memory  strategies that he has implemented during functional activity over 3 out of 5 session. Baseline: Goal Status: INITIAL.     LONG TERM GOALS: Target date: 11/12/2023  Updated: 09/25/2023 The patient will read sentence-level information aloud at 80% accuracy given intermittent minimal verbal cues in order to increase ability to safely live independently. Baseline:  Goal status: INITIAL: MET  2.   With Min A, patient will use external aid for functional recall of completed/upcoming activities 75% accuracy.     Baseline:  Goal status: INITIAL: MET  3.   With Min A, patient will complete semi-complex visual memory tasks with 75% accuracy independently.   Baseline:  Goal status: INITIAL: MET  4.  With Supervision A, patient will use strategies (ie., white board, daily planner/calendar, Apps on phone) to improve memory for important information with 80% accuracy.  Baseline: Goal status: INITIAL    ASSESSMENT:  CLINICAL IMPRESSION: Patient is a 73 y.o. right handed male who was seen today for a cognitive communication treatment d/t right MCA CVA. Pt presents with overall  moderate cognitive impairment with severe deficits in delayed memory, severe deficits in visuospatial/constructional abilities (apart from left inattention), decreased awareness of deficits/errors and mild deficits in higher level attention. Immediate memory, language and selective attention are relative strengths for pt.   Pt continues with improved abilities within ST sessions as well as report of improved ability within functional tasks at home.  See the above treatment note for details.   OBJECTIVE IMPAIRMENTS include attention, memory, awareness, and executive functioning. These impairments are limiting patient from return to work, managing medications, managing appointments, managing finances, household responsibilities, and ADLs/IADLs. Factors affecting potential to achieve goals and functional outcome are severity of  impairments. Patient will benefit from skilled SLP services to address above impairments and improve overall function.  REHAB POTENTIAL: Good  PLAN: SLP FREQUENCY: 1-2x/week  SLP DURATION: 12 weeks  PLANNED INTERVENTIONS: Cognitive reorganization, Internal/external aids, Functional tasks, SLP instruction and feedback, Compensatory strategies, and Patient/family education   An Schnabel B. Rubbie, M.S., CCC-SLP, Tree surgeon Certified Brain Injury Specialist [facility]  [facility] Services Office [phone] Ascom [phone] Fax [phone]

## 2023-10-04 NOTE — Therapy (Signed)
 OUTPATIENT PHYSICAL THERAPY NEURO TREATMENT   Patient Name: [name]. MRN: [id number] DOB:08/12/1950, 73 y.o., male Today's Date: 10/04/2023   PCP: Dennise Remak, MD  REFERRING PROVIDER:    Pegge Toribio PARAS, PA-C    END OF SESSION:    PT End of Session - 10/04/23 0934     Visit Number 15    Number of Visits 24    Date for PT Re-Evaluation 11/12/23    PT Start Time 0935    PT Stop Time 1015    PT Time Calculation (min) 40 min    Equipment Utilized During Treatment Gait belt    Activity Tolerance Patient tolerated treatment well    Behavior During Therapy WFL for tasks assessed/performed            Past Medical History:  Diagnosis Date   Anemia    Cervical myelopathy (HCC)    Chronic venous insufficiency    [name] (chronic kidney disease), stage III (HCC)    Diabetes mellitus without complication (HCC)    GERD (gastroesophageal reflux disease)    Hypercholesteremia    Hypothyroidism    Kidney stones    about 50 in the past--last in March 4/24   Leg weakness, bilateral    due to Myasthenia Gravis   Myasthenia gravis (HCC)    Spinal stenosis    Vitamin B 12 deficiency    Vitamin B 12 deficiency    Wears hearing aid    bilateral   Past Surgical History:  Procedure Laterality Date   BACK SURGERY  09/2015   CARDIAC CATHETERIZATION     CATARACT EXTRACTION W/ INTRAOCULAR LENS IMPLANT     CATARACT EXTRACTION W/PHACO Right 12/21/2015   Procedure: CATARACT EXTRACTION PHACO AND INTRAOCULAR LENS PLACEMENT (IOC);  Surgeon: Dene Etienne, MD;  Location: [facility];  Service: Ophthalmology;  Laterality: Right;  DIABETIC - insulin  and oral meds   COLONOSCOPY     SHOULDER SURGERY     labrum repair   TONSILLECTOMY     Patient Active Problem List   Diagnosis Date Noted   High blood pressure 08/13/2023   Anemia 08/13/2023   Constipation 08/13/2023   Myasthenia gravis (HCC) 08/13/2023   Acute ischemic right MCA stroke (HCC) 07/19/2023    Lymphedema 12/27/2016   Leg pain 07/15/2016   Chronic venous insufficiency 07/15/2016   Swelling of limb 07/15/2016   Type 2 diabetes mellitus (HCC) 07/15/2016   Hyperlipidemia 07/15/2016    ONSET DATE:   REFERRING DIAG:  Diagnosis  I63.511 (ICD-10-CM) - Cerebral infarction due to unspecified occlusion or stenosis of right middle cerebral artery    THERAPY DIAG:  Muscle weakness (generalized)  Other lack of coordination  Hemiplegia and hemiparesis following cerebral infarction affecting left non-dominant side (HCC)  Difficulty in walking, not elsewhere classified  Unsteadiness on feet  Rationale for Evaluation and Treatment: Rehabilitation  SUBJECTIVE:  SUBJECTIVE STATEMENT:   Pt reports that he is doing well. Has visible bruising at chest near sternoclavicular joint and slightly inferior near L side sternocostal joint. Reports significant pain with overhead lift on this day.    Xrays were obtained, and no fx noted. States that pain is the worse with in dependent position.   Pt reports his goal is to learn to walk again if at all possible, even if it is only 10 feet, and he wants to focus on walking during every PT session.     From Initial Eval: Pt reports that he is doing well. Has not been moving much since discharging from [facility], as therapists instructed pt to perform transfers only at home due to high fall risk with ambulation. Continues to have significant weakenss in the LUE with difficulty extending fingers as well as numbness and inattention to the L side of body resulting in multiple cuts on Arm and leg with WC mobility in home.  Will be attaining custom power WC from [employer]. Has loaner currently.   Pt accompanied by: significant other [name]   PERTINENT HISTORY: [name]. is a 73 y.o. male with history of T2DM, HTN, [name], renal calculi, seropositive myasthenia gravis who was admitted to [facility] on 07/14/2023 with reports 3 to 4-day history of stuttering speech and fluctuating LUE weakness followed by fall.  He reported some double vision and per discussion with his neurology I had increased prednisone  to 10 mg/day due to concerns of MG flare.  He was found to have left facial weakness with dysarthria as well as LUE and LLE weakness.  MRI brain done revealing acute right MCA infarction involving right periventricular white matter and extending into right frontal and parietal lobes.    [name]. was admitted to rehab 07/19/2023 for inpatient therapies to consist of PT, ST and OT at least three hours five days a week. Past admission physiatrist, therapy team and rehab RN have worked together to provide customized collaborative inpatient rehab.  He continues on low-dose aspirin  and subcu Lovenox  was used for DVT prophylaxis during his stay.  His blood pressures were monitored on TID basis and has been stable on current regimen.  Serial check of CBC shows H&H to be relatively stable stool guaiac was negative x 1.  He continues to have lower extremity edema and was educated on importance of elevation for edema control.  Constipation has resolved with sorbitol  use on intermittent basis.  P.o. intake has been good.  He was noted to have Candida crura's which was treated with course of Diflucan.  MASD has resolved with use of Gerhardt's Butt cream.   Left hemiplegia as been a limiting factor with left knee pain noted with increase in activity.  EKG was added for support and pain management.  His diabetes has been monitored with ac/hs CBG checks and SSI was use prn for tighter BS control.  Metformin  was resumed at 5 mg twice daily and basal insulin  was slowly titrated upwards to 26 units.  Blood sugars continue to be variable and he was advised to increase metformin  to home dose  and titrate insulin  glargine slowly if blood sugars range over 150.  He has made steady gains during his stay and continues to be limited by mild left inattention with left knee plegia and weakness. He has been evaluated for  speciality wheelchair by [employer]. He will continue to receive follow up outpatient PT, OT and ST at [facility] after discharge.   PAIN:  Are you having pain? No and reports Numbness in the L H and and Leg   PRECAUTIONS: Fall  **reports gets rash from contact with latex  RED FLAGS: Hx of MG   WEIGHT BEARING RESTRICTIONS: No  FALLS: Has patient fallen in last 6 months? Yes. Number of falls 1  LIVING ENVIRONMENT: Lives with: lives with their spouse Lives in: House/apartment Stairs: Ramps installed while in the Hospital  Has following equipment at home: Vannie - 2 wheeled and Wheelchair (power)  PLOF: Independent with basic ADLs, Independent with household mobility without device, and Independent with community mobility with device with [facility]   PATIENT GOALS: relearn to Walk 15-38ft.   OBJECTIVE:  Note: Objective measures were completed at Evaluation unless otherwise noted.  DIAGNOSTIC FINDINGS: MRI brain done revealing acute right MCA infarction involving right periventricular white matter and extending into right frontal and parietal lobes.  COGNITION: Overall cognitive status: Impaired poor awareness of L side while dual tasking.    SENSATION: Light touch: Impaired   COORDINATION: Limited ROM and strength on the LUE limiting finger to nose.  ROM and strength deficits limit ankle to knee.   EDEMA:  Mild edema in the L UE.   MUSCLE TONE: LLE: Mild    POSTURE: flexed trunk. Lateral lean to the L   LOWER EXTREMITY ROM:     Active  Right Eval Left Eval  Hip flexion    Hip extension    Hip abduction    Hip adduction    Hip internal rotation    Hip external rotation    Knee flexion    Knee extension    Ankle dorsiflexion    Ankle  plantarflexion    Ankle inversion    Ankle eversion     (Blank rows = not tested)  LOWER EXTREMITY MMT:    MMT Right Eval Left Eval  Hip flexion 4+ 4-  Hip extension 4+ 3+  Hip abduction 4+ 3+  Hip adduction 4+ 3+  Hip internal rotation    Hip external rotation    Knee flexion 5 4-  Knee extension 5 4-  Ankle dorsiflexion 5 4  Ankle plantarflexion    Ankle inversion    Ankle eversion    (Blank rows = not tested)  BED MOBILITY:  Findings: Sit to supine Mod A and Max A Supine to sit Mod A and Max A  TRANSFERS: Sit to stand: Min A and Mod A  Assistive device utilized: Environmental consultant - 2 wheeled     Stand to sit: Min A  Assistive device utilized: Environmental consultant - 2 wheeled     Chair to chair: Min A and Mod A  Assistive device utilized: Environmental consultant - 2 wheeled      PT required to stabilize the LUE on RW as well as provide max cues for attention to hand to prevent sliding off hand grip.  At home Pt utilizes hand splint for stability on RW to prevent loss of grip in transfers.   RAMP:  Not tested  CURB:  Not tested  STAIRS: Not tested GAIT: Findings: Gait Characteristics: step to pattern, decreased hip/knee flexion- Left, Left foot flat, ataxic, lateral lean- Left, wide BOS, and poor foot clearance- Left, Distance walked: 26ft, Assistive device utilized:Walker - 2 wheeled, Level of assistance: Mod A and Max A, and Comments: poor awareness of lateral/anterior L LOB as well as poor awareness of LUE causing loos of grip on RW and heavy lateral LOB  to the L, +2 assist required to prevent fall and position chair under patient as he attempted turn at 3ft.    FUNCTIONAL TESTS:  5 times sit to stand: 1:29 min Timed up and go (TUG): unable to complete turn at 25ft.  10 meter walk test: unable to complete on this day.   PATIENT SURVEYS:  Stroke Impact Scale 49/80                                                                                                                              TREATMENT  DATE: 10/04/2023 Pt arrives to session via transport chair.   Session limited to minimal standing with continued pain in the L shoulder and sternoclavicular joint.   Stand pivot transfer to mat table.  Sit<>supine with min assist on the LLE.  PT instructed pt in supine therex/HEP review.  SAQ, 2 x 10  Ankle DF/PF 2 x 12 bil Heel slide 2 x 10 bil  Bridged 2 x 10  Clam shell with manual resistance 2 x 10  Hip abduction with straight knee. X 10 bil   Standing squat with UE support on RW x 8 with cues for proper ROM.  Gait with RW x 70ft to transport chair with min assist on this day; UE support on RW with hand splint in place.   Transported to OT for speech therapy treatment.   PATIENT EDUCATION: Education details: Pt educated throughout session about proper posture and technique with exercises. Improved exercise technique, movement at target joints, use of target muscles after min to mod verbal, visual, tactile cues.  Person educated: Patient and Spouse Education method: Explanation, VC/TC/Demo Education comprehension: verbalized understanding  HOME EXERCISE PROGRAM:  Access Code: [email or web address] URL: [email or web address] Date: 09/11/2023 Prepared by: Massie Dollar  Exercises - Seated Knee Extension AROM  - 1 x daily - 4 x weekly - 3 sets - 10 reps - Seated Hip Abduction  - 1 x daily - 4 x weekly - 3 sets - 10 reps - Seated March  - 1 x daily - 4 x weekly - 3 sets - 10 reps - Sit to Stand with Counter Support  - 1 x daily - 4 x weekly - 3 sets - 10 reps - Supine Short Arc Quad  - 1 x daily - 4 x weekly - 3 sets - 10 reps - Supine Hip Abduction  - 1 x daily - 4 x weekly - 3 sets - 10 reps - Supine Bridge  - 1 x daily - 4 x weekly - 3 sets - 3 reps - 2sec  hold  GOALS: Goals reviewed with patient? Yes  SHORT TERM GOALS: Target date: 09/25/2023    Patient will be independent in home exercise program to improve strength/mobility for better functional independence with  ADLs. Baseline: Initiated on 09/11/2023 Goal status: IN PROGRESS   LONG TERM GOALS: Target date: 11/13/2023    Patient will increase SIS-16 score to equal  to or greater than 10points to demonstrate statistically significant improvement in mobility and quality of life.  Baseline: 49/80 09/19/2023: 45/80 Goal status: IN PROGRESS  2.  Patient (> 48 years old) will complete five times sit to stand test in < 15 seconds indicating an increased LE strength and improved balance. Baseline: 1:41min 09/19/2023: 56.39 seconds using R UE support to push-up from armrest of green chair and requiring skilled min A for balance and lifting/lowering  Goal status: IN PROGRESS  3.  Patient will increase FIST score by > 6 points to demonstrate decreased fall risk during functional activities Baseline:  43/56  09/23/2023: 53/56 Goal status: MET and UPGRADED  09/23/2023: Patient will increase Berg Balance score to > 45/56 to demonstrate improved balance and decreased fall risk during functional activities and ADLs.  Baseline: 09/23/2023: 14/56 Goal status: IN PROGRESS  4.  Patient will increase 10 meter walk test to >0.4m/s as to improve gait speed for better community ambulation and to reduce fall risk. Baseline: 09/09/2023: 0.103 m/s ( and 37 seconds) using bari-RW with skilled heavy min A and +2 w/c follow for safety  09/19/2023: 0.5m/s using bari-RW with skilled min A of 1 and +2 w/c follow for safety Goal status: IN PROGRESS  5.  Patient will reduce timed up and go to <11 seconds to reduce fall risk and demonstrate improved transfer/gait ability. Baseline: unable to complete turn at 10 ft. Mod-max assist due to poor control of LW on RW, resulting in L lateral LOB  09/09/2023: and 32 seconds using bari-RW with L hand splint and +2 on R side for safety, therapist providing skilled min A on L side (Had pt set-up L hand on RW splint, get L foot in proper positioning, and scoot forward in seat before  starting the timer) 09/19/2023: 66min43 seconds using bari-RW with only skilled min A of 1 (Had pt set-up L hand on RW splint, get L foot in proper positioning, and scoot forward in seat before starting the timer) 7/2: 78sec(1:18.125) with RW and min assist overall.  Goal status: IN PROGRESS   ASSESSMENT:  CLINICAL IMPRESSION:   Patient is a 73 y.o. male who was seen today for physical therapy treatment for balance, coordination, and gait deficits following CVA in April. Session limited due to continued pain in the shoulder, despite stating that he was willing to perform increased gait training on this day. Tolerated all supine interventions well and demonstrated improved knee control with heel slides and proper squat technique.    Pt will continue to benefit from skilled physical therapy interventions to address above impairments, improve QOL, and attain therapy goals.     OBJECTIVE IMPAIRMENTS: Abnormal gait, cardiopulmonary status limiting activity, decreased activity tolerance, decreased balance, decreased cognition, decreased coordination, decreased endurance, decreased knowledge of condition, decreased knowledge of use of DME, decreased mobility, difficulty walking, decreased ROM, decreased strength, decreased safety awareness, dizziness, hypomobility, increased fascial restrictions, impaired perceived functional ability, increased muscle spasms, impaired sensation, impaired tone, impaired UE functional use, impaired vision/preception, improper body mechanics, postural dysfunction, and obesity.   ACTIVITY LIMITATIONS: carrying, lifting, bending, sitting, standing, squatting, stairs, transfers, bed mobility, bathing, toileting, dressing, reach over head, hygiene/grooming, locomotion level, and caring for others  PARTICIPATION LIMITATIONS: meal prep, cleaning, laundry, medication management, interpersonal relationship, driving, shopping, community activity, and yard work  PERSONAL FACTORS: Age,  Past/current experiences, Time since onset of injury/illness/exacerbation, and 3+ comorbidities: MG, HTN, CVA, lymphedema are also affecting patient's functional outcome.   REHAB POTENTIAL:  Good  CLINICAL DECISION MAKING: Unstable/unpredictable  EVALUATION COMPLEXITY: High  PLAN:  PT FREQUENCY: 1-2x/week  PT DURATION: 12 weeks  PLANNED INTERVENTIONS: [id number]- PT Re-evaluation, 97750- Physical Performance Testing, 97110-Therapeutic exercises, 97530- Therapeutic activity, [id number]- Neuromuscular re-education, 97535- Self Care, [id number]- Manual therapy, [id number]- Gait training, [phone]- Orthotic Initial, [phone]- Orthotic/Prosthetic subsequent, [phone]- Canalith repositioning, [id number]- Electrical stimulation (unattended), [phone]- Electrical stimulation (manual), [id number]- Wound care (first 20 sq cm), 97598- Wound care (each additional 20 sq cm), Patient/Family education, Balance training, Stair training, Taping, Dry Needling, Joint mobilization, Joint manipulation, Vestibular training, Visual/preceptual remediation/compensation, Cognitive remediation, DME instructions, Wheelchair mobility training, Cryotherapy, and Moist heat  PLAN FOR NEXT SESSION:   Gait training with L hand splint on bari-RW  Continue progression of turning through doorways and turning to sit in chair at end of gait as well as navigating around obstacles Continue reinforcing pt management of L hand placement on/off hand splint during transfers to hopefully reach mod-I stand pivot transfer level at home prior to reaching ambulatory level Family education for gait in simulated home environment.    Massie Dollar PT, DPT  Physical Therapist - [facility]  9:35 AM 10/04/23

## 2023-10-06 NOTE — Therapy (Signed)
 OCCUPATIONAL THERAPY NEURO TREATMENT NOTE  Patient Name: [name]. MRN: [id number] DOB:23-Jan-1951, 73 y.o., male Today's Date: 10/06/2023  PCP: Alm Rower, MD REFERRING PROVIDER: Hermann Toribio PARAS, PA-C  END OF SESSION:  OT End of Session - 10/06/23 1432     Visit Number 14    Number of Visits 24    Date for OT Re-Evaluation 11/12/23    OT Start Time 1015    OT Stop Time 1100    OT Time Calculation (min) 45 min    Activity Tolerance Patient tolerated treatment well    Behavior During Therapy WFL for tasks assessed/performed         Past Medical History:  Diagnosis Date   Anemia    Cervical myelopathy (HCC)    Chronic venous insufficiency    [name] (chronic kidney disease), stage III (HCC)    Diabetes mellitus without complication (HCC)    GERD (gastroesophageal reflux disease)    Hypercholesteremia    Hypothyroidism    Kidney stones    about 50 in the past--last in March 4/24   Leg weakness, bilateral    due to Myasthenia Gravis   Myasthenia gravis (HCC)    Spinal stenosis    Vitamin B 12 deficiency    Vitamin B 12 deficiency    Wears hearing aid    bilateral   Past Surgical History:  Procedure Laterality Date   BACK SURGERY  09/2015   CARDIAC CATHETERIZATION     CATARACT EXTRACTION W/ INTRAOCULAR LENS IMPLANT     CATARACT EXTRACTION W/PHACO Right 12/21/2015   Procedure: CATARACT EXTRACTION PHACO AND INTRAOCULAR LENS PLACEMENT (IOC);  Surgeon: Dene Etienne, MD;  Location: [facility];  Service: Ophthalmology;  Laterality: Right;  DIABETIC - insulin  and oral meds   COLONOSCOPY     SHOULDER SURGERY     labrum repair   TONSILLECTOMY     Patient Active Problem List   Diagnosis Date Noted   High blood pressure 08/13/2023   Anemia 08/13/2023   Constipation 08/13/2023   Myasthenia gravis (HCC) 08/13/2023   Acute ischemic right MCA stroke (HCC) 07/19/2023   Lymphedema 12/27/2016   Leg pain 07/15/2016   Chronic venous insufficiency  07/15/2016   Swelling of limb 07/15/2016   Type 2 diabetes mellitus (HCC) 07/15/2016   Hyperlipidemia 07/15/2016   ONSET DATE: 07/13/2023  REFERRING DIAG: Acute ischemic R MCA CVA  THERAPY DIAG: muscle weakness (generalized), other lack of coordination, vision disturbance, hemiplegia and hemiparesis following cerebral infarction affecting left non-dominant side (HCC)  Rationale for Evaluation and Treatment: Rehabilitation  SUBJECTIVE:  SUBJECTIVE STATEMENT: Pt reports that he's been alternating between hot and cold to manage his L shoulder pain since his recent fall.   Pt accompanied by: significant other  PERTINENT HISTORY: Pt. Was admitted to [facility] on 07/13/2023 after sustaining a CVA while on a trip to the beach. Pt. Was diagnosed with R MCA CVA. Pt. Was admitted to inpatient rehab from 07/19/2023-08/13/2023. Past medical history includes high BP, anemia, and Myasthenia Gravis.   PRECAUTIONS: None  WEIGHT BEARING RESTRICTIONS: No  PAIN: 10/04/23: 5/10 pain L shoulder, decreased to 3-4/10 pain following ice pack/gentle ROM during OT session Are you having pain?  10/01/2023- Yes, 7/10 in L shoulder due to most recent fall that occurred this past Friday. 5/10 L wrist  FALLS: Has patient fallen in last 6 months? Yes  LIVING ENVIRONMENT: Lives with: lives with their family and lives with their spouse-  Lives in: House/apartment- 2 story  home and resides mostly on the 1st floor Stairs: No- Ramped entrance Has following equipment at home:  Vannie, cane, Steadi, shower chair, handheld shower head, bedside commode  PLOF: Independent and Independent with basic ADLs Independent at home   PATIENT GOALS: Would like to walk 10-20 feet each time.   OBJECTIVE:  Note: Objective measures were completed at Evaluation unless otherwise noted.  HAND DOMINANCE: Right  ADLs: Overall ADLs:  Transfers/ambulation related to ADLs: Eating: Independent, 100% R handed Grooming: independent UB  Dressing: Can put on shirt independently LB Dressing: Difficulty with pants and requires assistance getting the pants on his feet, has difficulty putting on shoes and socks Toileting: Tot A for toilet hygiene. Uses Steadi during toilet transfers. Bathing: Requires assist with using the L arm to wash the R arm. Tub Shower transfers: Roll in shower, built in shower chair, grab bars and hand held shower head.  IADLs: Shopping: Total A (Wife typically did the shopping prior to onset)   Light housekeeping: Total A (wife typically performs prior to onset) Meal Prep: Independent with light snack prep Community mobility: No driving, able to get in/out of the car Medication management: Set up assistance from his wife using a pill box (pt. Is responsible for taking medications at the correct time) Financial management: family members check behind him. Handwriting: TBD Hobbies: gardening, wood working and sports- [employer] Work history: Owns a tax business MOBILITY STATUS: Needs Assist:    POSTURE COMMENTS:   Sitting balance: Good supported sitting balance  FUNCTIONAL OUTCOME MEASURES: TBD   UPPER EXTREMITY ROM:    Active ROM Right Eval  [facility] Right 09/23/2023 [facility] Left eval Left 09/23/2023  Shoulder flexion   89(110) 109(114)  Shoulder abduction   98(120) 109(120)  Shoulder adduction      Shoulder extension      Shoulder internal rotation      Shoulder external rotation      Elbow flexion   120(140) 130(136)  Elbow extension   -28(-10) -5(0)  Wrist flexion   60(60) Rest at 60 50(65)  Wrist extension   -48(42) 10(40)  Wrist ulnar deviation      Wrist radial deviation      Wrist pronation      Wrist supination      (Blank rows = not tested)  Eval: L Digit flexion: 2nd:3cm (0cm) 3rd: 0cm (0cm), 4th: 0cm (0cm), 5th: 2cm (0cm)  09/23/23:  L Digit flexion to [facility]: 2nd: 3.5cm (0cm), 3rd: 4 cm(0cm), 4th 3.5cm (0cm), 5th: 2cm (0cm)  Eval:  L Digit extension: Active Gross digit  extension through 10% of ROM  09/23/23:  L Digit extension: Active Gross digit extension through 50% of ROM   UPPER EXTREMITY MMT:     MMT Right eval Left eval Left 09/23/2023  Shoulder flexion 5 3- 3-/5  Shoulder abduction 5 3- 3-/5  Shoulder adduction     Shoulder extension     Shoulder internal rotation     Shoulder external rotation     Middle trapezius     Lower trapezius     Elbow flexion 5 TBD 3+/5  Elbow extension 5 TBD 4-/5  Wrist flexion   2/5  Wrist extension  2- 2/5  Wrist ulnar deviation     Wrist radial deviation     Wrist pronation     Wrist supination     (Blank rows = not tested)  HAND FUNCTION:   Grip strength:   Right: 94#, Left: 1#,   Lateral Key  Pinch strength:   Right: 14#, Left: 3#  COORDINATION:  Box and Blocks: Right: 34 blocks completed in 1 min., Left: 0 blocks completed in 1 min.   SENSATION: Sensation feels different in both hands.  L hand Light touch- impaired L hand Proprioception- impaired  EDEMA: Has had hx of edema, and came to Eval session with swelling in the L hand/wrist/forearm.   *pt. Has a laceration from his dog on the dorsal aspect of the L hand.   MUSCLE TONE: Increased tone through the UE and hand flexors.   COGNITION: Overall cognitive status: Within functional limits for tasks assessed  VISION: Subjective report: L sided inattention Baseline vision: Wears glasses for reading only Visual history: Right visual occlusion  VISION ASSESSMENT: To be assessed  PERCEPTION: TBD  PRAXIS: Impaired                                                                                                                 TREATMENT DATE: 10/04/2023 Ice pack draped over L shoulder for pain/edema management during OT session.  Pt tolerated well and kept on during activities noted below.  Self Care: -Educated pt and spouse on benefits of Saebostim One unit for tone management/NMR for the L hand digits.  OT encouraged pt/spouse  review [employer] website to consider purchase; OT provided website and device info.  Pt/spouse very receptive. -Reviewed indications for ice/heat for pain management to L shoulder.   Neuro re-ed: -Facilitated LUE active grasp/release and pron/sup movements working on manipulation skills with removing<> placing velcro blocks from board.  Incorporated visual scanning into L visual field by having pt discard blocks into OT 's hand which was varied in position within pt's L visual field.  Therapeutic Exercise: -Gentle passive stretching for L wrist and digit ext, forearm supination, and L shoulder flex/abd/horiz abd/add and ER/IR to tolerance, working to achieve max tolerable/pain free end range and to prevent stiffness following recent fall which has contributed to L shoulder pain.  PATIENT EDUCATION: Education details:  Pain management; Saebostim One device Person educated: Patient and spouse Education method: Explanation and Verbal cues Education comprehension: verbalized understanding and needs further education  HOME EXERCISE PROGRAM: Self passive stretching throughout the LUE; grasp/release activities, wrist strengthening   GOALS: Goals reviewed with patient? Yes  SHORT TERM GOALS: Target date: 10/01/2023   Pt. Will be independent with HEP for the LUE.  Baseline: 09/19/23: Requires assist from his wife Eval; no current HEP Goal status: Ongoing   LONG TERM GOALS: Target date: 11/12/2023  Pt. Will increase L shoulder flexion by 10 degrees to be able to reach up to shelves.  Baseline: 09/19/23: Pt. Continues to be limited with reaching up to shelves. Eval: L shoulder flexion is 89 (110). Goal status: Ongoing  2.  Pt. Will increase L shoulder abduction by 10 degrees to assist with underarm hygiene.  Baseline: 09/19/23: Pt. Continues to require assist with underarm hygiene.Eval: L shoulder abduction is 98 (120).  Goal status: Ongoing  3.  Pt. Will improve L  wrist extension by 10 degrees  to be able to initiate anticipation of grasping for objects.  Baseline: 09/19/23: Consistent activation noted in the left wrist extensors with facilitation. Eval: -48 (42) Goal status: Ongoing  4.  Pt. Will improve active gross digit extension through 50% of the range to be able to release objects in his hand consistently.  Baseline: 09/19/23: gross digit extension noted through 25% range with facilitation.Eval: gross digit extension is 10% of the range  Goal status: Goal updated to 50% of the range  5.  Pt. Will independently demonstrate visual compensatory strategies when navigating through environment and during tabletop tasks. Baseline: 09/19/23: Pt. Requires fewer cues for left sided awareness Eval: Pt. Requires consistent cuing for L sided awareness.  Goal status: Ongoing  ASSESSMENT:  CLINICAL IMPRESSION: Pt continues to present with mild swelling in the L shoulder/upper traps, and pectorals, but reports that he's been consistent to alternate ice and heat packs at home, which have helped.  Limited active shoulder mobility to table top level only today, and passive L shoulder mobility to 90*, which pt tolerated well.  Ice pack applied to L shoulder throughout session with good tolerance.  Pt verbalized a reduction in pain from 5/10 at start of session, to 3-4/10 pain by end of session following ice and gentle PROM to L shoulder.  Pt continues to demonstrate improvements in L active grasp/release skills, though he does require intermittent passive wrist and digit ext to normalize tone for easing release of objects from hand.  Pt does drop blocks frequently d/t weakness in digit flexors, but all of the above are showing improvement.  Pt and spouse both verbalized being very receptive to education on benefits of Saebostim one unit as a tool to provide NMR and tone management for the LUE, specifically L wrist and digit extensors.  Spouse began to look into this device during OT session.  Will follow up  next session with their decision about ordering this device.  Pt continues to benefit from OT services to work on improving LUE functioning to increase engagement of the LUE during daily task, and to provide education about compensatory strategies during ADLs/IADLs.  PERFORMANCE DEFICITS: in functional skills including ADLs, IADLs, coordination, dexterity, proprioception, sensation, edema, tone, ROM, strength, pain, Fine motor control, Gross motor control, mobility, balance, endurance, vision, and UE functional use, cognitive skills including perception, and psychosocial skills including coping strategies, environmental adaptation, and routines and behaviors.   IMPAIRMENTS: are limiting patient from ADLs, IADLs, and leisure.   CO-MORBIDITIES: may have co-morbidities  that affects occupational performance. Patient will benefit from skilled OT to address above impairments and improve overall function.  MODIFICATION OR ASSISTANCE TO COMPLETE EVALUATION: Min-Moderate modification of tasks or assist with assess necessary to complete an evaluation.  OT OCCUPATIONAL PROFILE AND HISTORY: Detailed assessment: Review of records and additional review of physical, cognitive, psychosocial history related to current functional performance.  CLINICAL DECISION MAKING: Moderate - several treatment options, min-mod task modification necessary  REHAB POTENTIAL: Good  EVALUATION COMPLEXITY: Moderate    PLAN:  OT FREQUENCY: 2x/week  OT DURATION: 12 weeks  PLANNED INTERVENTIONS: 97168 OT Re-evaluation, 97535 self care/ADL training, 02889 therapeutic exercise, 97530 therapeutic activity, 97112 neuromuscular re-education, 97140 manual therapy, 97018 paraffin, 02989 moist heat, 97010 cryotherapy, 97034 contrast bath, 97760 Orthotic Initial, 97763 Orthotic/Prosthetic subsequent, passive range of motion, visual/perceptual remediation/compensation, energy conservation, and DME and/or AE instructions  RECOMMENDED  OTHER SERVICES: PT and ST  CONSULTED AND AGREED WITH PLAN OF CARE:  Patient and family member/caregiver  PLAN FOR NEXT SESSION: see above  Inocente Blazing, MS, OTR/L

## 2023-10-07 ENCOUNTER — Ambulatory Visit: Admitting: Physical Therapy

## 2023-10-07 ENCOUNTER — Ambulatory Visit

## 2023-10-07 ENCOUNTER — Ambulatory Visit: Admitting: Speech Pathology

## 2023-10-07 DIAGNOSIS — M6281 Muscle weakness (generalized): Secondary | ICD-10-CM

## 2023-10-07 DIAGNOSIS — R2681 Unsteadiness on feet: Secondary | ICD-10-CM

## 2023-10-07 DIAGNOSIS — R41841 Cognitive communication deficit: Secondary | ICD-10-CM | POA: Diagnosis not present

## 2023-10-07 DIAGNOSIS — I69354 Hemiplegia and hemiparesis following cerebral infarction affecting left non-dominant side: Secondary | ICD-10-CM

## 2023-10-07 DIAGNOSIS — R269 Unspecified abnormalities of gait and mobility: Secondary | ICD-10-CM

## 2023-10-07 DIAGNOSIS — R262 Difficulty in walking, not elsewhere classified: Secondary | ICD-10-CM

## 2023-10-07 DIAGNOSIS — R278 Other lack of coordination: Secondary | ICD-10-CM

## 2023-10-07 DIAGNOSIS — H539 Unspecified visual disturbance: Secondary | ICD-10-CM

## 2023-10-07 NOTE — Therapy (Signed)
 OUTPATIENT PHYSICAL THERAPY NEURO TREATMENT   Patient Name: [name]. MRN: [id number] DOB:[birth year], 73 y.o., male Today's Date: 10/07/2023   PCP: Douglas Remak, MD  REFERRING PROVIDER:    Pegge Toribio PARAS, PA-C    END OF SESSION:    PT End of Session - 10/07/23 1403     Visit Number 16    Number of Visits 24    Date for PT Re-Evaluation 11/12/23    PT Start Time 1402    PT Stop Time 1448    PT Time Calculation (min) 46 min    Equipment Utilized During Treatment Gait belt    Activity Tolerance Patient tolerated treatment well    Behavior During Therapy WFL for tasks assessed/performed             Past Medical History:  Diagnosis Date   Anemia    Cervical myelopathy (HCC)    Chronic venous insufficiency    [name] (chronic kidney disease), stage III (HCC)    Diabetes mellitus without complication (HCC)    GERD (gastroesophageal reflux disease)    Hypercholesteremia    Hypothyroidism    Kidney stones    about 50 in the past--last in March 4/24   Leg weakness, bilateral    due to Myasthenia Gravis   Myasthenia gravis (HCC)    Spinal stenosis    Vitamin B 12 deficiency    Vitamin B 12 deficiency    Wears hearing aid    bilateral   Past Surgical History:  Procedure Laterality Date   BACK SURGERY  09/2015   CARDIAC CATHETERIZATION     CATARACT EXTRACTION W/ INTRAOCULAR LENS IMPLANT     CATARACT EXTRACTION W/PHACO Right 12/21/2015   Procedure: CATARACT EXTRACTION PHACO AND INTRAOCULAR LENS PLACEMENT (IOC);  Surgeon: Douglas Etienne, MD;  Location: [facility];  Service: Ophthalmology;  Laterality: Right;  DIABETIC - insulin  and oral meds   COLONOSCOPY     SHOULDER SURGERY     labrum repair   TONSILLECTOMY     Patient Active Problem List   Diagnosis Date Noted   High blood pressure 08/13/2023   Anemia 08/13/2023   Constipation 08/13/2023   Myasthenia gravis (HCC) 08/13/2023   Acute ischemic right MCA stroke (HCC) 07/19/2023    Lymphedema 12/27/2016   Leg pain 07/15/2016   Chronic venous insufficiency 07/15/2016   Swelling of limb 07/15/2016   Type 2 diabetes mellitus (HCC) 07/15/2016   Hyperlipidemia 07/15/2016    ONSET DATE:   REFERRING DIAG:  Diagnosis  I63.511 (ICD-10-CM) - Cerebral infarction due to unspecified occlusion or stenosis of right middle cerebral artery    THERAPY DIAG:  Muscle weakness (generalized)  Other lack of coordination  Hemiplegia and hemiparesis following cerebral infarction affecting left non-dominant side (HCC)  Vision disturbance  Difficulty in walking, not elsewhere classified  Unsteadiness on feet  Abnormality of gait and mobility  Rationale for Evaluation and Treatment: Rehabilitation  SUBJECTIVE:  SUBJECTIVE STATEMENT:   Pt reports the pain in his L shoulder is a lot better than it was. Pt states if he moves it around a lot he will get a little tweak, but other than that it feels almost back to normal. Pt reports he is still doing stand pivot transfers using RW at home and his wife reports she is still providing supervision during.  Pt reports he is using stationary bicycle pedals on the floor called a cubii to exercise at home.   Pt reports his goal is to learn to walk again if at all possible, even if it is only 10 feet, and he wants to focus on walking during every PT session.     From Initial Eval: Pt reports that he is doing well. Has not been moving much since discharging from [facility], as therapists instructed pt to perform transfers only at home due to high fall risk with ambulation. Continues to have significant weakenss in the LUE with difficulty extending fingers as well as numbness and inattention to the L side of body resulting in multiple cuts on Arm and leg with  WC mobility in home.  Will be attaining custom power WC from [employer]. Has loaner currently.   Pt accompanied by: significant other [name]   PERTINENT HISTORY: Jeanmarc Viernes. is a 73 y.o. male with history of T2DM, HTN, [name], renal calculi, seropositive myasthenia gravis who was admitted to [facility] on 07/14/2023 with reports 3 to 4-day history of stuttering speech and fluctuating LUE weakness followed by fall.  He reported some double vision and per discussion with his neurology I had increased prednisone  to 10 mg/day due to concerns of MG flare.  He was found to have left facial weakness with dysarthria as well as LUE and LLE weakness.  MRI brain done revealing acute right MCA infarction involving right periventricular white matter and extending into right frontal and parietal lobes.    [name]. was admitted to rehab 07/19/2023 for inpatient therapies to consist of PT, ST and OT at least three hours five days a week. Past admission physiatrist, therapy team and rehab RN have worked together to provide customized collaborative inpatient rehab.  He continues on low-dose aspirin  and subcu Lovenox  was used for DVT prophylaxis during his stay.  His blood pressures were monitored on TID basis and has been stable on current regimen.  Serial check of CBC shows H&H to be relatively stable stool guaiac was negative x 1.  He continues to have lower extremity edema and was educated on importance of elevation for edema control.  Constipation has resolved with sorbitol  use on intermittent basis.  P.o. intake has been good.  He was noted to have Candida crura's which was treated with course of Diflucan.  MASD has resolved with use of Gerhardt's Butt cream.   Left hemiplegia as been a limiting factor with left knee pain noted with increase in activity.  EKG was added for support and pain management.  His diabetes has been monitored with ac/hs CBG checks and SSI was use prn for tighter BS control.  Metformin   was resumed at 5 mg twice daily and basal insulin  was slowly titrated upwards to 26 units.  Blood sugars continue to be variable and he was advised to increase metformin  to home dose and titrate insulin  glargine slowly if blood sugars range over 150.  He has made steady gains during his stay and continues to be limited by mild left inattention with left  knee plegia and weakness. He has been evaluated for  speciality wheelchair by [employer]. He will continue to receive follow up outpatient PT, OT and ST at [facility] after discharge.   PAIN:  Are you having pain? No and reports Numbness in the L H and and Leg   PRECAUTIONS: Fall  **reports gets rash from contact with latex  RED FLAGS: Hx of MG   WEIGHT BEARING RESTRICTIONS: No  FALLS: Has patient fallen in last 6 months? Yes. Number of falls 1  LIVING ENVIRONMENT: Lives with: lives with their spouse Lives in: House/apartment Stairs: Ramps installed while in the Hospital  Has following equipment at home: Vannie - 2 wheeled and Wheelchair (power)  PLOF: Independent with basic ADLs, Independent with household mobility without device, and Independent with community mobility with device with [facility]   PATIENT GOALS: relearn to Walk 15-80ft.   OBJECTIVE:  Note: Objective measures were completed at Evaluation unless otherwise noted.  DIAGNOSTIC FINDINGS: MRI brain done revealing acute right MCA infarction involving right periventricular white matter and extending into right frontal and parietal lobes.  COGNITION: Overall cognitive status: Impaired poor awareness of L side while dual tasking.    SENSATION: Light touch: Impaired   COORDINATION: Limited ROM and strength on the LUE limiting finger to nose.  ROM and strength deficits limit ankle to knee.   EDEMA:  Mild edema in the L UE.   MUSCLE TONE: LLE: Mild    POSTURE: flexed trunk. Lateral lean to the L   LOWER EXTREMITY ROM:     Active  Right Eval Left Eval  Hip  flexion    Hip extension    Hip abduction    Hip adduction    Hip internal rotation    Hip external rotation    Knee flexion    Knee extension    Ankle dorsiflexion    Ankle plantarflexion    Ankle inversion    Ankle eversion     (Blank rows = not tested)  LOWER EXTREMITY MMT:    MMT Right Eval Left Eval  Hip flexion 4+ 4-  Hip extension 4+ 3+  Hip abduction 4+ 3+  Hip adduction 4+ 3+  Hip internal rotation    Hip external rotation    Knee flexion 5 4-  Knee extension 5 4-  Ankle dorsiflexion 5 4  Ankle plantarflexion    Ankle inversion    Ankle eversion    (Blank rows = not tested)  BED MOBILITY:  Findings: Sit to supine Mod A and Max A Supine to sit Mod A and Max A  TRANSFERS: Sit to stand: Min A and Mod A  Assistive device utilized: Environmental consultant - 2 wheeled     Stand to sit: Min A  Assistive device utilized: Environmental consultant - 2 wheeled     Chair to chair: Min A and Mod A  Assistive device utilized: Environmental consultant - 2 wheeled      PT required to stabilize the LUE on RW as well as provide max cues for attention to hand to prevent sliding off hand grip.  At home Pt utilizes hand splint for stability on RW to prevent loss of grip in transfers.   RAMP:  Not tested  CURB:  Not tested  STAIRS: Not tested GAIT: Findings: Gait Characteristics: step to pattern, decreased hip/knee flexion- Left, Left foot flat, ataxic, lateral lean- Left, wide BOS, and poor foot clearance- Left, Distance walked: 47ft, Assistive device utilized:Walker - 2 wheeled, Level of assistance:  Mod A and Max A, and Comments: poor awareness of lateral/anterior L LOB as well as poor awareness of LUE causing loos of grip on RW and heavy lateral LOB to the L, +2 assist required to prevent fall and position chair under patient as he attempted turn at 53ft.    FUNCTIONAL TESTS:  5 times sit to stand: 1:29 min Timed up and go (TUG): unable to complete turn at 46ft.  10 meter walk test: unable to complete on this day.    PATIENT SURVEYS:  Stroke Impact Scale 49/80                                                                                                                              TREATMENT DATE: 10/07/2023  Pt arrives to session via transport chair.   Unless otherwise stated, min A was provided and gait belt donned in order to ensure pt safety throughout session.  R stand pivot transport chair>wheelchair with airex cushion pad to elevate seat height, using bari-RW with L hand splint - skilled min A for blaance When pt released L hand from RW splint he had minor anterior lean/LOB requiring skilled min A to maintain upright - cuing and education for increased balance awareness during this task and pt improved throughout session Cued/educated pt to use L hand to pull AD towards him when going to stand rather than pushing it away and this also improved pt's balance and L hand placement on RW hand splint when transitioning sit>stand   Gait training 118ft using bari-RW with L hand splint (wearing L Swedish knee cage) with skilled min assist for balance and +2 w/c follow. Pt demonstrating the following gait deviations with therapist providing the described cuing and facilitation for improvement:  Pt occasionally stepping L foot onto R foot with excessive adduction, but pt able to recognize and correct this with decreased cuing and no significant increase in balance assist Otherwise, achieves reciprocal pattern majority of the time, but with decreased L step length Had w/c placed at end for pt to turn and sit - pt self-selected to make a larger turn so he could approach the wheelchair on his R side rather than approaching it from the L as would have made senses with the set-up Continues to have slow gait speed to maintain balance with increased time during R stance phase to allow time for coordinated L Douglas swing advancement   Block practice short distance ~61ft ambulatory transfer form w/c to standard green  chair x3 reps with focus on approaching seat from L and turning to sit in chair as pt more confident with approaching chair on his R This also provided increased practice of patient managing L hand on/off RW handle during sit<>stand and pulling L hand towards him to stabilize RW when going from sit>stand  Pt will benefit from additional reps of approaching seat from his L with pt having some incoordination during L lateral stepping, but overall performs  with good sequence and technique requiring skilled min A   Gait training additional 18ft using bari-RW with skilled min assist for balance and +2 w/c follow to allow increased distance. Pt demonstrating the following gait deviations with therapist providing the described cuing and facilitation for improvement:  Improved L Douglas swing mechanics with increased step length and more consistent heel strike at initial contact rather than staying in excessive hip/knee flexion during terminal swing Improved upright posture with trunk/hip extension These improvements result in improved balance   Transported to speech therapy.    PATIENT EDUCATION: Education details: Pt educated throughout session about proper posture and technique with exercises. Improved exercise technique, movement at target joints, use of target muscles after min to mod verbal, visual, tactile cues.  Person educated: Patient and Spouse Education method: Explanation, VC/TC/Demo Education comprehension: verbalized understanding  HOME EXERCISE PROGRAM:  Access Code: [email or web address] URL: [email or web address] Date: 09/11/2023 Prepared by: Massie Dollar  Exercises - Seated Knee Extension AROM  - 1 x daily - 4 x weekly - 3 sets - 10 reps - Seated Hip Abduction  - 1 x daily - 4 x weekly - 3 sets - 10 reps - Seated March  - 1 x daily - 4 x weekly - 3 sets - 10 reps - Sit to Stand with Counter Support  - 1 x daily - 4 x weekly - 3 sets - 10 reps - Supine Short Arc Quad  - 1 x  daily - 4 x weekly - 3 sets - 10 reps - Supine Hip Abduction  - 1 x daily - 4 x weekly - 3 sets - 10 reps - Supine Bridge  - 1 x daily - 4 x weekly - 3 sets - 3 reps - 2sec  hold  GOALS: Goals reviewed with patient? Yes  SHORT TERM GOALS: Target date: 09/25/2023    Patient will be independent in home exercise program to improve strength/mobility for better functional independence with ADLs. Baseline: Initiated on 09/11/2023 Goal status: IN PROGRESS   LONG TERM GOALS: Target date: 11/13/2023    Patient will increase SIS-16 score to equal to or greater than 10points to demonstrate statistically significant improvement in mobility and quality of life.  Baseline: 49/80 09/19/2023: 45/80 Goal status: IN PROGRESS  2.  Patient (> 24 years old) will complete five times sit to stand test in < 15 seconds indicating an increased Douglas strength and improved balance. Baseline: 1:65min 09/19/2023: 56.39 seconds using R UE support to push-up from armrest of green chair and requiring skilled min A for balance and lifting/lowering  Goal status: IN PROGRESS  3.  Patient will increase FIST score by > 6 points to demonstrate decreased fall risk during functional activities Baseline:  43/56  09/23/2023: 53/56 Goal status: MET and UPGRADED  09/23/2023: Patient will increase Berg Balance score to > 45/56 to demonstrate improved balance and decreased fall risk during functional activities and ADLs.  Baseline: 09/23/2023: 14/56 Goal status: IN PROGRESS  4.  Patient will increase 10 meter walk test to >0.3m/s as to improve gait speed for better community ambulation and to reduce fall risk. Baseline: 09/09/2023: 0.103 m/s ( and 37 seconds) using bari-RW with skilled heavy min A and +2 w/c follow for safety  09/19/2023: 0.49m/s using bari-RW with skilled min A of 1 and +2 w/c follow for safety Goal status: IN PROGRESS  5.  Patient will reduce timed up and go to <11 seconds to reduce fall risk and demonstrate  improved transfer/gait ability.  Baseline: unable to complete turn at 10 ft. Mod-max assist due to poor control of LW on RW, resulting in L lateral LOB  09/09/2023: and 32 seconds using bari-RW with L hand splint and +2 on R side for safety, therapist providing skilled min A on L side (Had pt set-up L hand on RW splint, get L foot in proper positioning, and scoot forward in seat before starting the timer) 09/19/2023: 70min43 seconds using bari-RW with only skilled min A of 1 (Had pt set-up L hand on RW splint, get L foot in proper positioning, and scoot forward in seat before starting the timer) 7/2: 78sec(1:18.125) with RW and min assist overall.  Goal status: IN PROGRESS   ASSESSMENT:  CLINICAL IMPRESSION:   Patient is a 73 y.o. male who was seen today for physical therapy treatment for balance, coordination, and gait deficits following CVA in April. Patient reporting significant improvement in L shoulder pain; therefore, allowing increased focus on gait training during today's session. Patient demonstrates impaired L Douglas swing advancement with excessive hip/knee flexion and adduction with a few instances pt steps L foot on his R foot; however, pt improving in ability to be aware of this and execute correct motor plan to correct it without increased balance assistance. Patient did notice to have decreased confidence when transferring to a chair he approaches on his L side; therefore, participated in block practice of this to improve motor plan and sequencing AD and Douglas stepping. Pt will continue to benefit from skilled physical therapy interventions to address above impairments, improve QOL, and attain therapy goals.     OBJECTIVE IMPAIRMENTS: Abnormal gait, cardiopulmonary status limiting activity, decreased activity tolerance, decreased balance, decreased cognition, decreased coordination, decreased endurance, decreased knowledge of condition, decreased knowledge of use of DME, decreased mobility,  difficulty walking, decreased ROM, decreased strength, decreased safety awareness, dizziness, hypomobility, increased fascial restrictions, impaired perceived functional ability, increased muscle spasms, impaired sensation, impaired tone, impaired UE functional use, impaired vision/preception, improper body mechanics, postural dysfunction, and obesity.   ACTIVITY LIMITATIONS: carrying, lifting, bending, sitting, standing, squatting, stairs, transfers, bed mobility, bathing, toileting, dressing, reach over head, hygiene/grooming, locomotion level, and caring for others  PARTICIPATION LIMITATIONS: meal prep, cleaning, laundry, medication management, interpersonal relationship, driving, shopping, community activity, and yard work  PERSONAL FACTORS: Age, Past/current experiences, Time since onset of injury/illness/exacerbation, and 3+ comorbidities: MG, HTN, CVA, lymphedema are also affecting patient's functional outcome.   REHAB POTENTIAL: Good  CLINICAL DECISION MAKING: Unstable/unpredictable  EVALUATION COMPLEXITY: High  PLAN:  PT FREQUENCY: 1-2x/week  PT DURATION: 12 weeks  PLANNED INTERVENTIONS: 97164- PT Re-evaluation, 97750- Physical Performance Testing, 97110-Therapeutic exercises, 97530- Therapeutic activity, [id number]- Neuromuscular re-education, 97535- Self Care, [id number]- Manual therapy, [id number]- Gait training, [phone]- Orthotic Initial, [phone]- Orthotic/Prosthetic subsequent, [phone]- Canalith repositioning, [id number]- Electrical stimulation (unattended), [phone]- Electrical stimulation (manual), [id number]- Wound care (first 20 sq cm), 97598- Wound care (each additional 20 sq cm), Patient/Family education, Balance training, Stair training, Taping, Dry Needling, Joint mobilization, Joint manipulation, Vestibular training, Visual/preceptual remediation/compensation, Cognitive remediation, DME instructions, Wheelchair mobility training, Cryotherapy, and Moist heat  PLAN FOR NEXT SESSION:  Gait training with L  hand splint on bari-RW  Continue progression of turning through doorways and turning to sit in chair at end of gait (bias having chair on his L for increased challenge) as well as navigating around obstacles Continue reinforcing pt management of L hand placement on/off hand splint during transfers to hopefully reach mod-I stand pivot transfer level at home prior to  reaching ambulatory level Cue pt to pull AD towards him with L hand when transitioning sit>stand Family education for gait in simulated home environment.     Connell Kiss, PT, DPT, NCS, CSRS Physical Therapist - [place]  [facility]  2:50 PM 10/07/23

## 2023-10-07 NOTE — Therapy (Addendum)
 OUTPATIENT SPEECH LANGUAGE PATHOLOGY  COGNITION TREATMENT NOTE   Patient Name: [name]. MRN: [id number] DOB:08-Jun-1950, 73 y.o., male Today's Date: 10/07/2023  PCP: Douglas Stank, MD REFERRING PROVIDER: Toribio pitch, PA-C    End of Session - 10/07/23 1516     Visit Number 13    Number of Visits 25    Date for SLP Re-Evaluation 11/12/23    Authorization Type [employer]    Authorization Time Period 08/20/2023 thru 10/15/2023    Authorization - Visit Number 13    Authorization - Number of Visits 16    Progress Note Due on Visit 20    SLP Start Time 1445    SLP Stop Time  1525    SLP Time Calculation (min) 40 min    Activity Tolerance Patient tolerated treatment well           Past Medical History:  Diagnosis Date   Anemia    Cervical myelopathy (HCC)    Chronic venous insufficiency    [name] (chronic kidney disease), stage III (HCC)    Diabetes mellitus without complication (HCC)    GERD (gastroesophageal reflux disease)    Hypercholesteremia    Hypothyroidism    Kidney stones    about 50 in the past--last in March 4/24   Leg weakness, bilateral    due to Myasthenia Gravis   Myasthenia gravis (HCC)    Spinal stenosis    Vitamin B 12 deficiency    Vitamin B 12 deficiency    Wears hearing aid    bilateral   Past Surgical History:  Procedure Laterality Date   BACK SURGERY  09/2015   CARDIAC CATHETERIZATION     CATARACT EXTRACTION W/ INTRAOCULAR LENS IMPLANT     CATARACT EXTRACTION W/PHACO Right 12/21/2015   Procedure: CATARACT EXTRACTION PHACO AND INTRAOCULAR LENS PLACEMENT (IOC);  Surgeon: Dene Etienne, MD;  Location: [facility];  Service: Ophthalmology;  Laterality: Right;  DIABETIC - insulin  and oral meds   COLONOSCOPY     SHOULDER SURGERY     labrum repair   TONSILLECTOMY     Patient Active Problem List   Diagnosis Date Noted   High blood pressure 08/13/2023   Anemia 08/13/2023   Constipation 08/13/2023    Myasthenia gravis (HCC) 08/13/2023   Acute ischemic right MCA stroke (HCC) 07/19/2023   Lymphedema 12/27/2016   Leg pain 07/15/2016   Chronic venous insufficiency 07/15/2016   Swelling of limb 07/15/2016   Type 2 diabetes mellitus (HCC) 07/15/2016   Hyperlipidemia 07/15/2016    ONSET DATE: 07/19/2023   REFERRING DIAG: P36.488 (ICD-10-CM) - Cerebral infarction due to unspecified occlusion or stenosis of right middle cerebral artery  THERAPY DIAG:  Cognitive communication deficit  Rationale for Evaluation and Treatment Rehabilitation  SUBJECTIVE:   PERTINENT HISTORY and DIAGNOSTIC FINDINGS:   [name] is a 73 y.o. 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  CT Brain w/o contrast: 07/14/2023 New hypodensity within the right periventricular white matter extending into the inferior frontal lobe/operculum compatible with a recent infarct.   MRI Brain w/o contrast: 07/15/2023 IMPRESSION:  Acute right MCA distribution infarct involving the right periventricular white matter extending into the right frontal and parietal lobes and insula , some of which correlates to the recent CT finding. No significant associated mass effect or associated hemorrhage.   MRA head  angiogram 07/16/2023 IMPRESSION:  Focal occlusion of the right inferior division distal M2 MCA (6:123) in keeping with known right MCA infarct when compared to prior MRI brain 07/15/2023.   CT head 07/17/2023 IMPRESSION:  Expansion of territory involved in the right MCA infarct compared with MRI of 07/15/2023. Increasing local mass effect with new leftward midline shift of 3 mm. No hemorrhage.   Speech Language Evaluation at [facility] 07/19/2023 Patient presents with cognitive deficits in the areas of problem solving and left  inattention as evidenced throughout patient/family interview and performance on Cognistat evaluation. Patient further demonstrates impairments in the areas of intellectual, anticipatory, and emergent awareness of deficits/mistakes.   Speech Language Evaluation at [facility] 07/31/2023 Skilled therapy session focused on re-evaluation of cognitive skills utilizing the CLQT standardized assessment. Patient scored with overall mild cognitive deficits in the subtests of attention, executive functioning and visual spatial skills. Patient scored WFL on the clock drawing task, language and memory.   PAIN:  Are you having pain? No   FALLS: Has patient fallen in last 6 months?  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 SLP facilitated session by providing the following interventions:  To continue to assess pt progress, the [employer] Test was administered.   The HVLT-R is a list learning test, which consists of 12 nouns within three semantic groups. The test has three learning trials in which the administrator reads the words aloud and then asks the patient to repeat as many as he/she can remember in any order. The three learning trials are used to calculate a Total Recall Score and Recognition Discrimination Index (RDI).  Immediate Recall: Trial 1: 4 (mean 5.8 / SD: 1.9)   Trial 2: 9 (mean 8.3 / SD: 2) Trial 3: 10 (mean 9.4 / SD: 2) Total Recall Score:  23 (mean  23.4/ SD 5.2)  Delayed Recognition True positive responses: 12  False-positive errors: 1  Recognition Discrimination Index (RDI): 11 (mean 11)  Pt's performance was within the average range  The Neuro-QOLT Item Bank v2.0-Cognition Function-Short Form is an eight-item test designed to measure difficulties with cognitive functioning (e.g., memory, attention and decision making or in the application of such abilities to everyday tasks (e.g., planning, organizing, calculating, remembering and learning). Source: Meg CHARM Haber, J-S, et al. (2012). Neuro-QOL: brief measures of health-related quality of life for clinical research in neurology. Neurology, 78(23), [id number].   In the past 7 days...  I had to read something several times to understand it. 5- Never My thinking was slow. 5- Never I had to work really hard to pay attention or I would make a mistake. 3- Sometimes (2-3 times)  I had trouble concentrating. 5- Never  How much DIFFICULTY do you currently have...  Reading and following complex instructions (e.g., directions for a new medication)? 5- None  Planning for and keeping appointments that are not part of your weekly routine (e.g., a therapy or doctor appointment, or a social gathering with friends and family)? 5- None  Managing your time to do most  of your daily activities? 5- None  Learning new tasks or instructions? 5- None   T-SCORE: 56;  pt's score above mean of 50   Both report that pt is doing more. For example, he efficiently told his wife how to connect their fax machine (great verbal organization, self- monitoring double checking), he also has started demonstrating pre-planning and task sequencing prior to task initiation.     PATIENT EDUCATION: Education details: see above Person educated: Patient and Spouse Education method: Explanation Education comprehension: needs further education   HOME EXERCISE PROGRAM:   See above for details    GOALS:  Goals reviewed  with patient? Yes  SHORT TERM GOALS: Target date: 10 sessions  Updated: 09/25/2023 The patient will complete a simple word search puzzle (66 or smaller) within 10 minutes given intermittent moderate verbal cues. Baseline: Goal status: INITIAL: MET  2.  The patient will recall sentence-level information after a 5-minute delay using the spaced retrieval technique. Baseline:  Goal status: INITIAL: MET  3.  With moderate cues, pt will identify 3 cognitive strengths/weaknesses during tasks.  Baseline:  Goal status: INITIAL: MET  4. Pt will complete semi-complex reasoning task with supervision A to achieve > 90% accuracy.  Baseline:   Goal status: INITIAL  5. Pt will recall 2-3 compensatory memory strategies that he has implemented during functional activity over 3 out of 5 session. Baseline: Goal Status: INITIAL.     LONG TERM GOALS: Target date: 11/12/2023  Updated: 09/25/2023 The patient will read sentence-level information aloud at 80% accuracy given intermittent minimal verbal cues in order to increase ability to safely live independently. Baseline:  Goal status: INITIAL: MET  2.   With Min A, patient will use external aid for functional recall of completed/upcoming activities 75% accuracy.     Baseline:  Goal status: INITIAL: MET  3.   With Min A, patient will complete semi-complex visual memory tasks with 75% accuracy independently.   Baseline:  Goal status: INITIAL: MET  4.  With Supervision A, patient will use strategies (ie., white board, daily planner/calendar, Apps on phone) to improve memory for important information with 80% accuracy.  Baseline: Goal status: INITIAL    ASSESSMENT:  CLINICAL IMPRESSION: Patient is a 73 y.o. right handed male who was seen today for a cognitive communication treatment d/t right MCA CVA. Pt presents with overall  moderate cognitive impairment with severe deficits in delayed memory, severe deficits in visuospatial/constructional  abilities (apart from left inattention), decreased awareness of deficits/errors and mild deficits in higher level attention. Immediate memory, language and selective attention are relative strengths for pt.   Pt continues with improved abilities within ST sessions as well as report of improved ability within functional tasks at home. He and his wife report that pt wrote checks correctly, they purchased a new vehicle with pt calling the insurance company and [employer], handled the financial aspect of purchase, cooked supper one evening and has started recalling prospective information such as completing online vehicle registration. See the above treatment note for details.   OBJECTIVE IMPAIRMENTS include attention, memory, awareness, and executive functioning. These impairments are limiting patient from return to work, managing medications, managing appointments, managing finances, household responsibilities, and ADLs/IADLs. Factors affecting potential to achieve goals and functional outcome are severity of impairments. Patient will benefit from skilled SLP services to address above impairments and improve overall function.  REHAB POTENTIAL: Good  PLAN: SLP FREQUENCY: 1-2x/week  SLP DURATION: 12 weeks  PLANNED INTERVENTIONS: Cognitive reorganization, Internal/external aids,  Functional tasks, SLP instruction and feedback, Compensatory strategies, and Patient/family education   Arisha Gervais B. Rubbie, M.S., CCC-SLP, Tree surgeon Certified Brain Injury Specialist [facility]  [facility] Rehabilitation Services Office [phone] Ascom [phone] Fax [phone]

## 2023-10-09 ENCOUNTER — Ambulatory Visit: Admitting: Speech Pathology

## 2023-10-09 ENCOUNTER — Ambulatory Visit: Admitting: Physical Therapy

## 2023-10-09 DIAGNOSIS — R2681 Unsteadiness on feet: Secondary | ICD-10-CM

## 2023-10-09 DIAGNOSIS — R41841 Cognitive communication deficit: Secondary | ICD-10-CM

## 2023-10-09 DIAGNOSIS — H539 Unspecified visual disturbance: Secondary | ICD-10-CM

## 2023-10-09 DIAGNOSIS — R278 Other lack of coordination: Secondary | ICD-10-CM

## 2023-10-09 DIAGNOSIS — R262 Difficulty in walking, not elsewhere classified: Secondary | ICD-10-CM

## 2023-10-09 DIAGNOSIS — M6281 Muscle weakness (generalized): Secondary | ICD-10-CM

## 2023-10-09 DIAGNOSIS — I69354 Hemiplegia and hemiparesis following cerebral infarction affecting left non-dominant side: Secondary | ICD-10-CM

## 2023-10-09 DIAGNOSIS — R269 Unspecified abnormalities of gait and mobility: Secondary | ICD-10-CM

## 2023-10-09 NOTE — Therapy (Unsigned)
 OUTPATIENT SPEECH LANGUAGE PATHOLOGY  COGNITION TREATMENT NOTE   Patient Name: [name]. MRN: [id number] DOB:Dec 10, 1950, 73 y.o., male Today's Date: 10/09/2023  PCP: Rolin Stank, MD REFERRING PROVIDER: Toribio pitch, PA-C    End of Session - 10/09/23 1639     Visit Number 14    Number of Visits 25    Date for SLP Re-Evaluation 11/12/23    Authorization Type [employer]    Authorization Time Period 08/20/2023 thru 10/15/2023    Authorization - Visit Number 14    Authorization - Number of Visits 16    Progress Note Due on Visit 20    SLP Start Time 1145    SLP Stop Time  1220    SLP Time Calculation (min) 35 min    Activity Tolerance Patient tolerated treatment well           Past Medical History:  Diagnosis Date   Anemia    Cervical myelopathy (HCC)    Chronic venous insufficiency    [name] (chronic kidney disease), stage III (HCC)    Diabetes mellitus without complication (HCC)    GERD (gastroesophageal reflux disease)    Hypercholesteremia    Hypothyroidism    Kidney stones    about 50 in the past--last in March 4/24   Leg weakness, bilateral    due to Myasthenia Gravis   Myasthenia gravis (HCC)    Spinal stenosis    Vitamin B 12 deficiency    Vitamin B 12 deficiency    Wears hearing aid    bilateral   Past Surgical History:  Procedure Laterality Date   BACK SURGERY  09/2015   CARDIAC CATHETERIZATION     CATARACT EXTRACTION W/ INTRAOCULAR LENS IMPLANT     CATARACT EXTRACTION W/PHACO Right 12/21/2015   Procedure: CATARACT EXTRACTION PHACO AND INTRAOCULAR LENS PLACEMENT (IOC);  Surgeon: Dene Etienne, MD;  Location: [facility];  Service: Ophthalmology;  Laterality: Right;  DIABETIC - insulin  and oral meds   COLONOSCOPY     SHOULDER SURGERY     labrum repair   TONSILLECTOMY     Patient Active Problem List   Diagnosis Date Noted   High blood pressure 08/13/2023   Anemia 08/13/2023   Constipation 08/13/2023    Myasthenia gravis (HCC) 08/13/2023   Acute ischemic right MCA stroke (HCC) 07/19/2023   Lymphedema 12/27/2016   Leg pain 07/15/2016   Chronic venous insufficiency 07/15/2016   Swelling of limb 07/15/2016   Type 2 diabetes mellitus (HCC) 07/15/2016   Hyperlipidemia 07/15/2016    ONSET DATE: 07/19/2023   REFERRING DIAG: P36.488 (ICD-10-CM) - Cerebral infarction due to unspecified occlusion or stenosis of right middle cerebral artery  THERAPY DIAG:  Cognitive communication deficit  Rationale for Evaluation and Treatment Rehabilitation  SUBJECTIVE:   PERTINENT HISTORY and DIAGNOSTIC FINDINGS:   [name] is a 73 y.o. 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  CT Brain w/o contrast: 07/14/2023 New hypodensity within the right periventricular white matter extending into the inferior frontal lobe/operculum compatible with a recent infarct.   MRI Brain w/o contrast: 07/15/2023 IMPRESSION:  Acute right MCA distribution infarct involving the right periventricular white matter extending into the right frontal and parietal lobes and insula , some of which correlates to the recent CT finding. No significant associated mass effect or associated hemorrhage.   MRA head  angiogram 07/16/2023 IMPRESSION:  Focal occlusion of the right inferior division distal M2 MCA (6:123) in keeping with known right MCA infarct when compared to prior MRI brain 07/15/2023.   CT head 07/17/2023 IMPRESSION:  Expansion of territory involved in the right MCA infarct compared with MRI of 07/15/2023. Increasing local mass effect with new leftward midline shift of 3 mm. No hemorrhage.   Speech Language Evaluation at [facility] 07/19/2023 Patient presents with cognitive deficits in the areas of problem solving and left  inattention as evidenced throughout patient/family interview and performance on Cognistat evaluation. Patient further demonstrates impairments in the areas of intellectual, anticipatory, and emergent awareness of deficits/mistakes.   Speech Language Evaluation at [facility] 07/31/2023 Skilled therapy session focused on re-evaluation of cognitive skills utilizing the CLQT standardized assessment. Patient scored with overall mild cognitive deficits in the subtests of attention, executive functioning and visual spatial skills. Patient scored WFL on the clock drawing task, language and memory.   PAIN:  Are you having pain? No   FALLS: Has patient fallen in last 6 months?  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 SLP facilitated session by providing the following interventions:  To continue to assess pt progress, the [employer] Test was administered.   The HVLT-R is a list learning test, which consists of 12 nouns within three semantic groups. The test has three learning trials in which the administrator reads the words aloud and then asks the patient to repeat as many as he/she can remember in any order. The three learning trials are used to calculate a Total Recall Score and Recognition Discrimination Index (RDI).  Immediate Recall: Trial 1: 4 (mean 5.8 / SD: 1.9)   Trial 2: 9 (mean 8.3 / SD: 2) Trial 3: 10 (mean 9.4 / SD: 2) Total Recall Score:  23 (mean  23.4/ SD 5.2)  Delayed Recognition True positive responses: 12  False-positive errors: 1  Recognition Discrimination Index (RDI): 11 (mean 11)  Pt's performance was within the average range  The Neuro-QOLT Item Bank v2.0-Cognition Function-Short Form is an eight-item test designed to measure difficulties with cognitive functioning (e.g., memory, attention and decision making or in the application of such abilities to everyday tasks (e.g., planning, organizing, calculating, remembering and learning). Source: Meg CHARM Haber, J-S, et al. (2012). Neuro-QOL: brief measures of health-related quality of life for clinical research in neurology. Neurology, 78(23), [phone].   In the past 7 days...  I had to read something several times to understand it. 5- Never My thinking was slow. 5- Never I had to work really hard to pay attention or I would make a mistake. 3- Sometimes (2-3 times)  I had trouble concentrating. 5- Never  How much DIFFICULTY do you currently have...  Reading and following complex instructions (e.g., directions for a new medication)? 5- None  Planning for and keeping appointments that are not part of your weekly routine (e.g., a therapy or doctor appointment, or a social gathering with friends and family)? 5- None  Managing your time to do most  of your daily activities? 5- None  Learning new tasks or instructions? 5- None   T-SCORE: 56;  pt's score above mean of 50   Both report that pt is doing more. For example, he efficiently told his wife how to connect their fax machine (great verbal organization, self- monitoring double checking), he also has started demonstrating pre-planning and task sequencing prior to task initiation.     PATIENT EDUCATION: Education details: see above Person educated: Patient and Spouse Education method: Explanation Education comprehension: needs further education   HOME EXERCISE PROGRAM:   See above for details    GOALS:  Goals reviewed  with patient? Yes  SHORT TERM GOALS: Target date: 10 sessions  Updated: 09/25/2023 The patient will complete a simple word search puzzle (66 or smaller) within 10 minutes given intermittent moderate verbal cues. Baseline: Goal status: INITIAL: MET  2.  The patient will recall sentence-level information after a 5-minute delay using the spaced retrieval technique. Baseline:  Goal status: INITIAL: MET  3.  With moderate cues, pt will identify 3 cognitive strengths/weaknesses during tasks.  Baseline:  Goal status: INITIAL: MET  4. Pt will complete semi-complex reasoning task with supervision A to achieve > 90% accuracy.  Baseline:   Goal status: INITIAL  5. Pt will recall 2-3 compensatory memory strategies that he has implemented during functional activity over 3 out of 5 session. Baseline: Goal Status: INITIAL.     LONG TERM GOALS: Target date: 11/12/2023  Updated: 09/25/2023 The patient will read sentence-level information aloud at 80% accuracy given intermittent minimal verbal cues in order to increase ability to safely live independently. Baseline:  Goal status: INITIAL: MET  2.   With Min A, patient will use external aid for functional recall of completed/upcoming activities 75% accuracy.     Baseline:  Goal status: INITIAL: MET  3.   With Min A, patient will complete semi-complex visual memory tasks with 75% accuracy independently.   Baseline:  Goal status: INITIAL: MET  4.  With Supervision A, patient will use strategies (ie., white board, daily planner/calendar, Apps on phone) to improve memory for important information with 80% accuracy.  Baseline: Goal status: INITIAL    ASSESSMENT:  CLINICAL IMPRESSION: Patient is a 73 y.o. right handed male who was seen today for a cognitive communication treatment d/t right MCA CVA. Pt presents with overall  moderate cognitive impairment with severe deficits in delayed memory, severe deficits in visuospatial/constructional  abilities (apart from left inattention), decreased awareness of deficits/errors and mild deficits in higher level attention. Immediate memory, language and selective attention are relative strengths for pt.   Pt continues with improved abilities within ST sessions as well as report of improved ability within functional tasks at home. He and his wife report that pt wrote checks correctly, they purchased a new vehicle with pt calling the insurance company and [employer], handled the financial aspect of purchase, cooked supper one evening and has started recalling prospective information such as completing online vehicle registration. See the above treatment note for details.   OBJECTIVE IMPAIRMENTS include attention, memory, awareness, and executive functioning. These impairments are limiting patient from return to work, managing medications, managing appointments, managing finances, household responsibilities, and ADLs/IADLs. Factors affecting potential to achieve goals and functional outcome are severity of impairments. Patient will benefit from skilled SLP services to address above impairments and improve overall function.  REHAB POTENTIAL: Good  PLAN: SLP FREQUENCY: 1-2x/week  SLP DURATION: 12 weeks  PLANNED INTERVENTIONS: Cognitive reorganization, Internal/external aids,  Functional tasks, SLP instruction and feedback, Compensatory strategies, and Patient/family education   Lilja Soland B. Rubbie, M.S., CCC-SLP, Tree surgeon Certified Brain Injury Specialist [facility]  [facility] Rehabilitation Services Office [phone] Ascom [phone] Fax [phone]

## 2023-10-09 NOTE — Therapy (Signed)
 OUTPATIENT PHYSICAL THERAPY NEURO TREATMENT   Patient Name: [name]. MRN: [id number] DOB:[birth year], 73 y.o., male Today's Date: 10/09/2023   PCP: Dennise Remak, MD  REFERRING PROVIDER:    Pegge Toribio PARAS, PA-C    END OF SESSION:    PT End of Session - 10/09/23 1046     Visit Number 17    Number of Visits 24    Date for PT Re-Evaluation 11/12/23    PT Start Time 1100    PT Stop Time 1140    PT Time Calculation (min) 40 min    Equipment Utilized During Treatment Gait belt    Activity Tolerance Patient tolerated treatment well    Behavior During Therapy WFL for tasks assessed/performed             Past Medical History:  Diagnosis Date   Anemia    Cervical myelopathy (HCC)    Chronic venous insufficiency    [name] (chronic kidney disease), stage III (HCC)    Diabetes mellitus without complication (HCC)    GERD (gastroesophageal reflux disease)    Hypercholesteremia    Hypothyroidism    Kidney stones    about 50 in the past--last in March 4/24   Leg weakness, bilateral    due to Myasthenia Gravis   Myasthenia gravis (HCC)    Spinal stenosis    Vitamin B 12 deficiency    Vitamin B 12 deficiency    Wears hearing aid    bilateral   Past Surgical History:  Procedure Laterality Date   BACK SURGERY  09/2015   CARDIAC CATHETERIZATION     CATARACT EXTRACTION W/ INTRAOCULAR LENS IMPLANT     CATARACT EXTRACTION W/PHACO Right 12/21/2015   Procedure: CATARACT EXTRACTION PHACO AND INTRAOCULAR LENS PLACEMENT (IOC);  Surgeon: Dene Etienne, MD;  Location: [facility];  Service: Ophthalmology;  Laterality: Right;  DIABETIC - insulin  and oral meds   COLONOSCOPY     SHOULDER SURGERY     labrum repair   TONSILLECTOMY     Patient Active Problem List   Diagnosis Date Noted   High blood pressure 08/13/2023   Anemia 08/13/2023   Constipation 08/13/2023   Myasthenia gravis (HCC) 08/13/2023   Acute ischemic right MCA stroke (HCC) 07/19/2023    Lymphedema 12/27/2016   Leg pain 07/15/2016   Chronic venous insufficiency 07/15/2016   Swelling of limb 07/15/2016   Type 2 diabetes mellitus (HCC) 07/15/2016   Hyperlipidemia 07/15/2016    ONSET DATE:   REFERRING DIAG:  Diagnosis  I63.511 (ICD-10-CM) - Cerebral infarction due to unspecified occlusion or stenosis of right middle cerebral artery    THERAPY DIAG:  Muscle weakness (generalized)  Other lack of coordination  Hemiplegia and hemiparesis following cerebral infarction affecting left non-dominant side (HCC)  Vision disturbance  Difficulty in walking, not elsewhere classified  Unsteadiness on feet  Abnormality of gait and mobility  Rationale for Evaluation and Treatment: Rehabilitation  SUBJECTIVE:  SUBJECTIVE STATEMENT:   Pt reports that is L shoulder has been getting better. Even felt like exercising it last night with 1 lb weight. States that he may have over done it, because it was bothering him overnight.  But feeling better this AM. No pain at start of PT treatment: t Pt reports he is using stationary bicycle pedals on the floor called a cubii to exercise at home.   Pt reports his goal is to learn to walk again if at all possible, even if it is only 10 feet, and he wants to focus on walking during every PT session.     From Initial Eval: Pt reports that he is doing well. Has not been moving much since discharging from [facility], as therapists instructed pt to perform transfers only at home due to high fall risk with ambulation. Continues to have significant weakenss in the LUE with difficulty extending fingers as well as numbness and inattention to the L side of body resulting in multiple cuts on Arm and leg with WC mobility in home.  Will be attaining custom power WC from  [employer]. Has loaner currently.   Pt accompanied by: significant other [name]   PERTINENT HISTORY: [name]. is a 73 y.o. male with history of T2DM, HTN, [name], renal calculi, seropositive myasthenia gravis who was admitted to [facility] on 07/14/2023 with reports 3 to 4-day history of stuttering speech and fluctuating LUE weakness followed by fall.  He reported some double vision and per discussion with his neurology I had increased prednisone  to 10 mg/day due to concerns of MG flare.  He was found to have left facial weakness with dysarthria as well as LUE and LLE weakness.  MRI brain done revealing acute right MCA infarction involving right periventricular white matter and extending into right frontal and parietal lobes.    [name]. was admitted to rehab 07/19/2023 for inpatient therapies to consist of PT, ST and OT at least three hours five days a week. Past admission physiatrist, therapy team and rehab RN have worked together to provide customized collaborative inpatient rehab.  He continues on low-dose aspirin  and subcu Lovenox  was used for DVT prophylaxis during his stay.  His blood pressures were monitored on TID basis and has been stable on current regimen.  Serial check of CBC shows H&H to be relatively stable stool guaiac was negative x 1.  He continues to have lower extremity edema and was educated on importance of elevation for edema control.  Constipation has resolved with sorbitol  use on intermittent basis.  P.o. intake has been good.  He was noted to have Candida crura's which was treated with course of Diflucan.  MASD has resolved with use of Gerhardt's Butt cream.   Left hemiplegia as been a limiting factor with left knee pain noted with increase in activity.  EKG was added for support and pain management.  His diabetes has been monitored with ac/hs CBG checks and SSI was use prn for tighter BS control.  Metformin  was resumed at 5 mg twice daily and basal insulin  was slowly  titrated upwards to 26 units.  Blood sugars continue to be variable and he was advised to increase metformin  to home dose and titrate insulin  glargine slowly if blood sugars range over 150.  He has made steady gains during his stay and continues to be limited by mild left inattention with left knee plegia and weakness. He has been evaluated for  speciality wheelchair by [employer].  He will continue to receive follow up outpatient PT, OT and ST at [facility] after discharge.   PAIN:  Are you having pain? No and reports Numbness in the L H and and Leg   PRECAUTIONS: Fall  **reports gets rash from contact with latex  RED FLAGS: Hx of MG   WEIGHT BEARING RESTRICTIONS: No  FALLS: Has patient fallen in last 6 months? Yes. Number of falls 1  LIVING ENVIRONMENT: Lives with: lives with their spouse Lives in: House/apartment Stairs: Ramps installed while in the Hospital  Has following equipment at home: Vannie - 2 wheeled and Wheelchair (power)  PLOF: Independent with basic ADLs, Independent with household mobility without device, and Independent with community mobility with device with [facility]   PATIENT GOALS: relearn to Walk 15-52ft.   OBJECTIVE:  Note: Objective measures were completed at Evaluation unless otherwise noted.  DIAGNOSTIC FINDINGS: MRI brain done revealing acute right MCA infarction involving right periventricular white matter and extending into right frontal and parietal lobes.  COGNITION: Overall cognitive status: Impaired poor awareness of L side while dual tasking.    SENSATION: Light touch: Impaired   COORDINATION: Limited ROM and strength on the LUE limiting finger to nose.  ROM and strength deficits limit ankle to knee.   EDEMA:  Mild edema in the L UE.   MUSCLE TONE: LLE: Mild    POSTURE: flexed trunk. Lateral lean to the L   LOWER EXTREMITY ROM:     Active  Right Eval Left Eval  Hip flexion    Hip extension    Hip abduction    Hip adduction     Hip internal rotation    Hip external rotation    Knee flexion    Knee extension    Ankle dorsiflexion    Ankle plantarflexion    Ankle inversion    Ankle eversion     (Blank rows = not tested)  LOWER EXTREMITY MMT:    MMT Right Eval Left Eval  Hip flexion 4+ 4-  Hip extension 4+ 3+  Hip abduction 4+ 3+  Hip adduction 4+ 3+  Hip internal rotation    Hip external rotation    Knee flexion 5 4-  Knee extension 5 4-  Ankle dorsiflexion 5 4  Ankle plantarflexion    Ankle inversion    Ankle eversion    (Blank rows = not tested)  BED MOBILITY:  Findings: Sit to supine Mod A and Max A Supine to sit Mod A and Max A  TRANSFERS: Sit to stand: Min A and Mod A  Assistive device utilized: Environmental consultant - 2 wheeled     Stand to sit: Min A  Assistive device utilized: Environmental consultant - 2 wheeled     Chair to chair: Min A and Mod A  Assistive device utilized: Environmental consultant - 2 wheeled      PT required to stabilize the LUE on RW as well as provide max cues for attention to hand to prevent sliding off hand grip.  At home Pt utilizes hand splint for stability on RW to prevent loss of grip in transfers.   RAMP:  Not tested  CURB:  Not tested  STAIRS: Not tested GAIT: Findings: Gait Characteristics: step to pattern, decreased hip/knee flexion- Left, Left foot flat, ataxic, lateral lean- Left, wide BOS, and poor foot clearance- Left, Distance walked: 49ft, Assistive device utilized:Walker - 2 wheeled, Level of assistance: Mod A and Max A, and Comments: poor awareness of lateral/anterior L LOB as well  as poor awareness of LUE causing loos of grip on RW and heavy lateral LOB to the L, +2 assist required to prevent fall and position chair under patient as he attempted turn at 75ft.    FUNCTIONAL TESTS:  5 times sit to stand: 1:29 min Timed up and go (TUG): unable to complete turn at 35ft.  10 meter walk test: unable to complete on this day.   PATIENT SURVEYS:  Stroke Impact Scale 49/80                                                                                                                               TREATMENT DATE: 10/09/2023  Pt arrives to session via transport chair.   Unless otherwise stated, min A was provided and gait belt donned in order to ensure pt safety throughout session.  R stand pivot transport chair>wheelchair  using bari-RW with L hand splint - skilled min A for balance. Improved motor planning for turn in transport chair to prepare transfer. Improved AD management noted as well to keep COM within BOS of BRW  Standing weight shift R and L to reach for basketball goal at eye level x 10 bil with 2sec hold improved ability to maintain lateral weight shift bil on this day.   Pregait stepping towards target in floor to maintain BOS >12 inches apart. X 12 bil. Cues for  weight shift and HS activation to return LLE to starting position.    Reciprocal foot tap on 5inch step with UE support on RW. Performed x8 with 2 sec hold on step with each LE. Min-mod assist from PT. On first bout of BLE due to difficulty with weight shift, but greatly improved ability sustain elevated position with increased repetitions   Gait with RW 2 x 28ft(CW and CCW) to walk around treadmill. Additional gait with RW To OT gym with RW, hand splint and knee cage x 136ft.  CGA-min assist at most throughout all gait training.  Min cues with fatigue to improve step width and attention to the LLE due to one instance of foot drag. Pt noted to have improved AD management in turns keeping COM with RW to reduce risk of LOB to the L. Increased time still required for motor planning with R turns, but decreased assist or cues required to prevent L LOB.   Able to perform all sit<>stand transfers with CGA and only one instance of instruction from PT for proper sequencing and attention to the LUE.   Transported to speech therapy.     PATIENT EDUCATION: Education details: Pt educated throughout session about proper  posture and technique with exercises. Improved exercise technique, movement at target joints, use of target muscles after min to mod verbal, visual, tactile cues.  Person educated: Patient and Spouse Education method: Explanation, VC/TC/Demo Education comprehension: verbalized understanding  HOME EXERCISE PROGRAM:  Access Code: [email or web address] URL: [email or web address] Date: 09/11/2023 Prepared by: Massie Dollar  Exercises - Seated Knee  Extension AROM  - 1 x daily - 4 x weekly - 3 sets - 10 reps - Seated Hip Abduction  - 1 x daily - 4 x weekly - 3 sets - 10 reps - Seated March  - 1 x daily - 4 x weekly - 3 sets - 10 reps - Sit to Stand with Counter Support  - 1 x daily - 4 x weekly - 3 sets - 10 reps - Supine Short Arc Quad  - 1 x daily - 4 x weekly - 3 sets - 10 reps - Supine Hip Abduction  - 1 x daily - 4 x weekly - 3 sets - 10 reps - Supine Bridge  - 1 x daily - 4 x weekly - 3 sets - 3 reps - 2sec  hold  GOALS: Goals reviewed with patient? Yes  SHORT TERM GOALS: Target date: 09/25/2023    Patient will be independent in home exercise program to improve strength/mobility for better functional independence with ADLs. Baseline: Initiated on 09/11/2023 Goal status: IN PROGRESS   LONG TERM GOALS: Target date: 11/13/2023    Patient will increase SIS-16 score to equal to or greater than 10points to demonstrate statistically significant improvement in mobility and quality of life.  Baseline: 49/80 09/19/2023: 45/80 Goal status: IN PROGRESS  2.  Patient (> 66 years old) will complete five times sit to stand test in < 15 seconds indicating an increased LE strength and improved balance. Baseline: 1:56min 09/19/2023: 56.39 seconds using R UE support to push-up from armrest of green chair and requiring skilled min A for balance and lifting/lowering  Goal status: IN PROGRESS  3.  Patient will increase FIST score by > 6 points to demonstrate decreased fall risk during functional  activities Baseline:  43/56  09/23/2023: 53/56 Goal status: MET and UPGRADED  09/23/2023: Patient will increase Berg Balance score to > 45/56 to demonstrate improved balance and decreased fall risk during functional activities and ADLs.  Baseline: 09/23/2023: 14/56 Goal status: IN PROGRESS  4.  Patient will increase 10 meter walk test to >0.55m/s as to improve gait speed for better community ambulation and to reduce fall risk. Baseline: 09/09/2023: 0.103 m/s ( and 37 seconds) using bari-RW with skilled heavy min A and +2 w/c follow for safety  09/19/2023: 0.88m/s using bari-RW with skilled min A of 1 and +2 w/c follow for safety Goal status: IN PROGRESS  5.  Patient will reduce timed up and go to <11 seconds to reduce fall risk and demonstrate improved transfer/gait ability. Baseline: unable to complete turn at 10 ft. Mod-max assist due to poor control of LW on RW, resulting in L lateral LOB  09/09/2023: and 32 seconds using bari-RW with L hand splint and +2 on R side for safety, therapist providing skilled min A on L side (Had pt set-up L hand on RW splint, get L foot in proper positioning, and scoot forward in seat before starting the timer) 09/19/2023: 15min43 seconds using bari-RW with only skilled min A of 1 (Had pt set-up L hand on RW splint, get L foot in proper positioning, and scoot forward in seat before starting the timer) 7/2: 78sec(1:18.125) with RW and min assist overall.  Goal status: IN PROGRESS   ASSESSMENT:  CLINICAL IMPRESSION:   Patient is a 73 y.o. male who was seen today for physical therapy treatment for balance, coordination, and gait deficits following CVA in April. Patient reporting significant improvement in L shoulder pain; therefore, allowing increased focus on  gait training during today's session. Pt demonstrates improved midline orientation and motor planning for all gait and transfers on this day. Also noted to have proved HS activation for reverse gait to  prepare for transfers, and to  lift LLE off step. Pt will continue to benefit from skilled physical therapy interventions to address above impairments, improve QOL, and attain therapy goals.     OBJECTIVE IMPAIRMENTS: Abnormal gait, cardiopulmonary status limiting activity, decreased activity tolerance, decreased balance, decreased cognition, decreased coordination, decreased endurance, decreased knowledge of condition, decreased knowledge of use of DME, decreased mobility, difficulty walking, decreased ROM, decreased strength, decreased safety awareness, dizziness, hypomobility, increased fascial restrictions, impaired perceived functional ability, increased muscle spasms, impaired sensation, impaired tone, impaired UE functional use, impaired vision/preception, improper body mechanics, postural dysfunction, and obesity.   ACTIVITY LIMITATIONS: carrying, lifting, bending, sitting, standing, squatting, stairs, transfers, bed mobility, bathing, toileting, dressing, reach over head, hygiene/grooming, locomotion level, and caring for others  PARTICIPATION LIMITATIONS: meal prep, cleaning, laundry, medication management, interpersonal relationship, driving, shopping, community activity, and yard work  PERSONAL FACTORS: Age, Past/current experiences, Time since onset of injury/illness/exacerbation, and 3+ comorbidities: MG, HTN, CVA, lymphedema are also affecting patient's functional outcome.   REHAB POTENTIAL: Good  CLINICAL DECISION MAKING: Unstable/unpredictable  EVALUATION COMPLEXITY: High  PLAN:  PT FREQUENCY: 1-2x/week  PT DURATION: 12 weeks  PLANNED INTERVENTIONS: 97164- PT Re-evaluation, 97750- Physical Performance Testing, 97110-Therapeutic exercises, 97530- Therapeutic activity, [id number]- Neuromuscular re-education, 97535- Self Care, [id number]- Manual therapy, [id number]- Gait training, [phone]- Orthotic Initial, [phone]- Orthotic/Prosthetic subsequent, [phone]- Canalith repositioning, [id number]- Electrical  stimulation (unattended), [phone]- Electrical stimulation (manual), [id number]- Wound care (first 20 sq cm), 97598- Wound care (each additional 20 sq cm), Patient/Family education, Balance training, Stair training, Taping, Dry Needling, Joint mobilization, Joint manipulation, Vestibular training, Visual/preceptual remediation/compensation, Cognitive remediation, DME instructions, Wheelchair mobility training, Cryotherapy, and Moist heat  PLAN FOR NEXT SESSION:  Gait training with L hand splint on bari-RW  Continue progression of turning through doorways and turning to sit in chair at end of gait (bias having chair on his L for increased challenge) as well as navigating around obstacles Continue reinforcing pt management of L hand placement on/off hand splint during transfers to hopefully reach mod-I stand pivot transfer level at home prior to reaching ambulatory level Cue pt to pull AD towards him with L hand when transitioning sit>stand Family education for gait in simulated home environment.    Massie Dollar PT, DPT  Physical Therapist - [place]  [facility]  1:31 PM 10/09/23

## 2023-10-10 NOTE — Therapy (Signed)
 OCCUPATIONAL THERAPY NEURO TREATMENT NOTE  Patient Name: [name]. MRN: [id number] DOB:1950/07/21, 73 y.o., male Today's Date: 10/10/2023  PCP: Alm Rower, MD REFERRING PROVIDER: Hermann Toribio PARAS, PA-C  END OF SESSION:  OT End of Session - 10/10/23 0815     Visit Number 15    Number of Visits 24    Date for OT Re-Evaluation 11/12/23    OT Start Time 1315    OT Stop Time 1400    OT Time Calculation (min) 45 min    Activity Tolerance Patient tolerated treatment well    Behavior During Therapy WFL for tasks assessed/performed         Past Medical History:  Diagnosis Date   Anemia    Cervical myelopathy (HCC)    Chronic venous insufficiency    [name] (chronic kidney disease), stage III (HCC)    Diabetes mellitus without complication (HCC)    GERD (gastroesophageal reflux disease)    Hypercholesteremia    Hypothyroidism    Kidney stones    about 50 in the past--last in March 4/24   Leg weakness, bilateral    due to Myasthenia Gravis   Myasthenia gravis (HCC)    Spinal stenosis    Vitamin B 12 deficiency    Vitamin B 12 deficiency    Wears hearing aid    bilateral   Past Surgical History:  Procedure Laterality Date   BACK SURGERY  09/2015   CARDIAC CATHETERIZATION     CATARACT EXTRACTION W/ INTRAOCULAR LENS IMPLANT     CATARACT EXTRACTION W/PHACO Right 12/21/2015   Procedure: CATARACT EXTRACTION PHACO AND INTRAOCULAR LENS PLACEMENT (IOC);  Surgeon: Dene Etienne, MD;  Location: [facility];  Service: Ophthalmology;  Laterality: Right;  DIABETIC - insulin  and oral meds   COLONOSCOPY     SHOULDER SURGERY     labrum repair   TONSILLECTOMY     Patient Active Problem List   Diagnosis Date Noted   High blood pressure 08/13/2023   Anemia 08/13/2023   Constipation 08/13/2023   Myasthenia gravis (HCC) 08/13/2023   Acute ischemic right MCA stroke (HCC) 07/19/2023   Lymphedema 12/27/2016   Leg pain 07/15/2016   Chronic venous insufficiency  07/15/2016   Swelling of limb 07/15/2016   Type 2 diabetes mellitus (HCC) 07/15/2016   Hyperlipidemia 07/15/2016   ONSET DATE: 07/13/2023  REFERRING DIAG: Acute ischemic R MCA CVA  THERAPY DIAG: muscle weakness (generalized), other lack of coordination, vision disturbance, hemiplegia and hemiparesis following cerebral infarction affecting left non-dominant side (HCC)  Rationale for Evaluation and Treatment: Rehabilitation  SUBJECTIVE:  SUBJECTIVE STATEMENT: Pt reports some mild benefit from Ktape today. Pt accompanied by: significant other  PERTINENT HISTORY: Pt. Was admitted to [facility] on 07/13/2023 after sustaining a CVA while on a trip to the beach. Pt. Was diagnosed with R MCA CVA. Pt. Was admitted to inpatient rehab from 07/19/2023-08/13/2023. Past medical history includes high BP, anemia, and Myasthenia Gravis.   PRECAUTIONS: None  WEIGHT BEARING RESTRICTIONS: No  PAIN: 10/07/23: 4/10 pain L shoulder Are you having pain?  10/01/2023- Yes, 7/10 in L shoulder due to most recent fall that occurred this past Friday. 5/10 L wrist  FALLS: Has patient fallen in last 6 months? Yes  LIVING ENVIRONMENT: Lives with: lives with their family and lives with their spouse-  Lives in: House/apartment- 2 story home and resides mostly on the 1st floor Stairs: No- Ramped entrance Has following equipment at home:  Vannie, cane, Steadi, shower chair, handheld shower  head, bedside commode  PLOF: Independent and Independent with basic ADLs Independent at home   PATIENT GOALS: Would like to walk 10-20 feet each time.   OBJECTIVE:  Note: Objective measures were completed at Evaluation unless otherwise noted.  HAND DOMINANCE: Right  ADLs: Overall ADLs:  Transfers/ambulation related to ADLs: Eating: Independent, 100% R handed Grooming: independent UB Dressing: Can put on shirt independently LB Dressing: Difficulty with pants and requires assistance getting the pants on his feet, has difficulty  putting on shoes and socks Toileting: Tot A for toilet hygiene. Uses Steadi during toilet transfers. Bathing: Requires assist with using the L arm to wash the R arm. Tub Shower transfers: Roll in shower, built in shower chair, grab bars and hand held shower head.  IADLs: Shopping: Total A (Wife typically did the shopping prior to onset)   Light housekeeping: Total A (wife typically performs prior to onset) Meal Prep: Independent with light snack prep Community mobility: No driving, able to get in/out of the car Medication management: Set up assistance from his wife using a pill box (pt. Is responsible for taking medications at the correct time) Financial management: family members check behind him. Handwriting: TBD Hobbies: gardening, wood working and sports- [employer] Work history: Owns a tax business MOBILITY STATUS: Needs Assist:    POSTURE COMMENTS:   Sitting balance: Good supported sitting balance  FUNCTIONAL OUTCOME MEASURES: TBD   UPPER EXTREMITY ROM:    Active ROM Right Eval  [facility] Right 09/23/2023 [facility] Left eval Left 09/23/2023  Shoulder flexion   89(110) 109(114)  Shoulder abduction   98(120) 109(120)  Shoulder adduction      Shoulder extension      Shoulder internal rotation      Shoulder external rotation      Elbow flexion   120(140) 130(136)  Elbow extension   -28(-10) -5(0)  Wrist flexion   60(60) Rest at 60 50(65)  Wrist extension   -48(42) 10(40)  Wrist ulnar deviation      Wrist radial deviation      Wrist pronation      Wrist supination      (Blank rows = not tested)  Eval: L Digit flexion: 2nd:3cm (0cm) 3rd: 0cm (0cm), 4th: 0cm (0cm), 5th: 2cm (0cm)  09/23/23:  L Digit flexion to [facility]: 2nd: 3.5cm (0cm), 3rd: 4 cm(0cm), 4th 3.5cm (0cm), 5th: 2cm (0cm)  Eval:  L Digit extension: Active Gross digit extension through 10% of ROM  09/23/23:  L Digit extension: Active Gross digit extension through 50% of ROM   UPPER EXTREMITY MMT:     MMT  Right eval Left eval Left 09/23/2023  Shoulder flexion 5 3- 3-/5  Shoulder abduction 5 3- 3-/5  Shoulder adduction     Shoulder extension     Shoulder internal rotation     Shoulder external rotation     Middle trapezius     Lower trapezius     Elbow flexion 5 TBD 3+/5  Elbow extension 5 TBD 4-/5  Wrist flexion   2/5  Wrist extension  2- 2/5  Wrist ulnar deviation     Wrist radial deviation     Wrist pronation     Wrist supination     (Blank rows = not tested)  HAND FUNCTION:   Grip strength:   Right: 94#, Left: 1#,   Lateral Key Pinch strength:   Right: 14#, Left: 3#  COORDINATION:  Box and Blocks: Right: 34 blocks completed in 1 min., Left: 0 blocks completed  in 1 min.   SENSATION: Sensation feels different in both hands.  L hand Light touch- impaired L hand Proprioception- impaired  EDEMA: Has had hx of edema, and came to Eval session with swelling in the L hand/wrist/forearm.   *pt. Has a laceration from his dog on the dorsal aspect of the L hand.   MUSCLE TONE: Increased tone through the UE and hand flexors.   COGNITION: Overall cognitive status: Within functional limits for tasks assessed  VISION: Subjective report: L sided inattention Baseline vision: Wears glasses for reading only Visual history: Right visual occlusion  VISION ASSESSMENT: To be assessed  PERCEPTION: TBD  PRAXIS: Impaired                                                                                                                 TREATMENT DATE: 10/07/2023 Ice pack draped over L shoulder for pain/edema management during OT session.  Pt tolerated well and kept on during activities noted below.  Neuro re-ed: -Kinesiotape applied to L wrist and digit extensors to promote grasp/release with the L. -Facilitated LUE active grasp/release and pron/sup movements working on manipulation skills with removing<> placing velcro blocks from board; board placed on incline wedge to further  promote wrist ext.  Pt worked on releasing blocks with elbow extended onto stool beside him, stool level lower than chair seat.   Therapeutic Exercise: -Gentle passive stretching for L wrist and digit ext, forearm supination, and L shoulder flex/abd/horiz abd/add and ER/IR to tolerance, working to achieve max tolerable/pain free end range and to prevent stiffness following recent fall which has contributed to L shoulder pain.  PATIENT EDUCATION: Education details:  Benefits of Ktape  Person educated: Patient and spouse Education method: Explanation and Verbal cues Education comprehension: verbalized understanding and needs further education  HOME EXERCISE PROGRAM: Self passive stretching throughout the LUE; grasp/release activities, wrist strengthening   GOALS: Goals reviewed with patient? Yes  SHORT TERM GOALS: Target date: 10/01/2023   Pt. Will be independent with HEP for the LUE.  Baseline: 09/19/23: Requires assist from his wife Eval; no current HEP Goal status: Ongoing   LONG TERM GOALS: Target date: 11/12/2023  Pt. Will increase L shoulder flexion by 10 degrees to be able to reach up to shelves.  Baseline: 09/19/23: Pt. Continues to be limited with reaching up to shelves. Eval: L shoulder flexion is 89 (110). Goal status: Ongoing  2.  Pt. Will increase L shoulder abduction by 10 degrees to assist with underarm hygiene.  Baseline: 09/19/23: Pt. Continues to require assist with underarm hygiene.Eval: L shoulder abduction is 98 (120).  Goal status: Ongoing  3.  Pt. Will improve L wrist extension by 10 degrees to be able to initiate anticipation of grasping for objects.  Baseline: 09/19/23: Consistent activation noted in the left wrist extensors with facilitation. Eval: -48 (42) Goal status: Ongoing  4.  Pt. Will improve active gross digit extension through 50% of the range to be able to release objects in his hand consistently.  Baseline:  09/19/23: gross digit extension noted  through 25% range with facilitation.Eval: gross digit extension is 10% of the range  Goal status: Goal updated to 50% of the range  5.  Pt. Will independently demonstrate visual compensatory strategies when navigating through environment and during tabletop tasks. Baseline: 09/19/23: Pt. Requires fewer cues for left sided awareness Eval: Pt. Requires consistent cuing for L sided awareness.  Goal status: Ongoing  ASSESSMENT:  CLINICAL IMPRESSION: Spouse reports she will plan to bring in her home device next session which she uses for her back for OT to identify if there is a neuro re-ed component which would benefit pt before she orders a Sa[employer] One unit.  Pt reported mild benefit from Ktape applied to dorsal L hand/wrist/forearm; OT able to observe improved digit ext for releasing blocks from hand with Ktape applied.  Pt with good tolerance to neuro re-ed and therapeutic exercises noted above.  Pain in L shoulder continues to be well managed with ice/heat at home.  Pt continues to benefit from OT services to work on improving LUE functioning to increase engagement of the LUE during daily task, and to provide education about compensatory strategies during ADLs/IADLs.  PERFORMANCE DEFICITS: in functional skills including ADLs, IADLs, coordination, dexterity, proprioception, sensation, edema, tone, ROM, strength, pain, Fine motor control, Gross motor control, mobility, balance, endurance, vision, and UE functional use, cognitive skills including perception, and psychosocial skills including coping strategies, environmental adaptation, and routines and behaviors.   IMPAIRMENTS: are limiting patient from ADLs, IADLs, and leisure.   CO-MORBIDITIES: may have co-morbidities  that affects occupational performance. Patient will benefit from skilled OT to address above impairments and improve overall function.  MODIFICATION OR ASSISTANCE TO COMPLETE EVALUATION: Min-Moderate modification of tasks or assist  with assess necessary to complete an evaluation.  OT OCCUPATIONAL PROFILE AND HISTORY: Detailed assessment: Review of records and additional review of physical, cognitive, psychosocial history related to current functional performance.  CLINICAL DECISION MAKING: Moderate - several treatment options, min-mod task modification necessary  REHAB POTENTIAL: Good  EVALUATION COMPLEXITY: Moderate    PLAN:  OT FREQUENCY: 2x/week  OT DURATION: 12 weeks  PLANNED INTERVENTIONS: 97168 OT Re-evaluation, 97535 self care/ADL training, 02889 therapeutic exercise, 97530 therapeutic activity, 97112 neuromuscular re-education, 97140 manual therapy, 97018 paraffin, 02989 moist heat, 97010 cryotherapy, 97034 contrast bath, 97760 Orthotic Initial, 97763 Orthotic/Prosthetic subsequent, passive range of motion, visual/perceptual remediation/compensation, energy conservation, and DME and/or AE instructions  RECOMMENDED OTHER SERVICES: PT and ST  CONSULTED AND AGREED WITH PLAN OF CARE: Patient and family member/caregiver  PLAN FOR NEXT SESSION: see above  Inocente Blazing, MS, OTR/L

## 2023-10-11 ENCOUNTER — Encounter: Payer: Self-pay | Admitting: Physical Medicine & Rehabilitation

## 2023-10-11 ENCOUNTER — Encounter: Admitting: Physical Medicine & Rehabilitation

## 2023-10-11 VITALS — BP 148/79 | HR 59

## 2023-10-11 DIAGNOSIS — I69354 Hemiplegia and hemiparesis following cerebral infarction affecting left non-dominant side: Secondary | ICD-10-CM

## 2023-10-11 DIAGNOSIS — I63511 Cerebral infarction due to unspecified occlusion or stenosis of right middle cerebral artery: Secondary | ICD-10-CM | POA: Diagnosis not present

## 2023-10-11 NOTE — Progress Notes (Signed)
 Subjective:    Patient ID: Douglas LITTIE [name]., male    DOB: 20-Mar-1951, 73 y.o.   MRN: [id number] Admit date: 07/19/2023 Discharge date: 08/13/2023 Right MCA infarct , Right inferior frontal lobe , hx MG, DM 2, HTN   HPI  No falls since discharge. Uses shower chair mod I except needs wife to hand him shower head  Not walking at home yet, Walking with PT in clinic Will transfer and do pressure relief independently Wife provides supervision but not hands on for toilet Pt cooking , making pizza and fried chicken  SLP  Attending OPPT, OPOT Pain Inventory Average Pain 0 Pain Right Now 0 My pain is .  In the last 24 hours, has pain interfered with the following? General activity 0 Relation with others 0 Enjoyment of life 0 What TIME of day is your pain at its worst? . Sleep (in general) Good  Pain is worse with: . Pain improves with: . Relief from Meds: .  Family History  Problem Relation Age of Onset   Clotting disorder Father    Heart attack Father    Social History   Socioeconomic History   Marital status: Married    Spouse name: Not on file   Number of children: Not on file   Years of education: Not on file   Highest education level: Not on file  Occupational History   Not on file  Tobacco Use   Smoking status: Never   Smokeless tobacco: Never  Substance and Sexual Activity   Alcohol use: No   Drug use: No   Sexual activity: Not on file  Other Topics Concern   Not on file  Social History Narrative   Not on file   Social Drivers of Health   Financial Resource Strain: Low Risk  (07/15/2023)   Received from [facility]   Overall Financial Resource Strain (CARDIA)    Difficulty of Paying Living Expenses: Not hard at all  Food Insecurity: No Food Insecurity (07/15/2023)   Received from [facility]   Hunger Vital Sign    Within the past 12 months, you worried that your food would run out before you got the money to buy  more.: Never true    Within the past 12 months, the food you bought just didn't last and you didn't have money to get more.: Never true  Transportation Needs: No Transportation Needs (07/15/2023)   Received from [facility] - Transportation    In the past 12 months, has lack of transportation kept you from medical appointments or from getting medications?: No    Lack of Transportation (Non-Medical): No  Physical Activity: Not on file  Stress: Not on file  Social Connections: Unknown (08/07/2021)   Received from [facility]   Social Network    Social Network: Not on file   Past Surgical History:  Procedure Laterality Date   BACK SURGERY  09/2015   CARDIAC CATHETERIZATION     CATARACT EXTRACTION W/ INTRAOCULAR LENS IMPLANT     CATARACT EXTRACTION W/PHACO Right 12/21/2015   Procedure: CATARACT EXTRACTION PHACO AND INTRAOCULAR LENS PLACEMENT (IOC);  Surgeon: Dene Etienne, MD;  Location: [facility];  Service: Ophthalmology;  Laterality: Right;  DIABETIC - insulin  and oral meds   COLONOSCOPY     SHOULDER SURGERY     labrum repair   TONSILLECTOMY     Past Surgical History:  Procedure Laterality Date   BACK SURGERY  09/2015   CARDIAC CATHETERIZATION     CATARACT EXTRACTION W/ INTRAOCULAR LENS IMPLANT     CATARACT EXTRACTION W/PHACO Right 12/21/2015   Procedure: CATARACT EXTRACTION PHACO AND INTRAOCULAR LENS PLACEMENT (IOC);  Surgeon: Dene Etienne, MD;  Location: [facility];  Service: Ophthalmology;  Laterality: Right;  DIABETIC - insulin  and oral meds   COLONOSCOPY     SHOULDER SURGERY     labrum repair   TONSILLECTOMY     Past Medical History:  Diagnosis Date   Anemia    Cervical myelopathy (HCC)    Chronic venous insufficiency    [name] (chronic kidney disease), stage III (HCC)    Diabetes mellitus without complication (HCC)    GERD (gastroesophageal reflux disease)    Hypercholesteremia    Hypothyroidism    Kidney  stones    about 50 in the past--last in March 4/24   Leg weakness, bilateral    due to Myasthenia Gravis   Myasthenia gravis (HCC)    Spinal stenosis    Vitamin B 12 deficiency    Vitamin B 12 deficiency    Wears hearing aid    bilateral   BP (!) 148/79 (BP Location: Right Arm, Patient Position: Sitting, Cuff Size: Large)   Pulse (!) 59   SpO2 98%   Opioid Risk Score:   Fall Risk Score:  `1  Depression screen [facility] 2/9     10/02/2023    9:52 AM 09/02/2023    1:43 PM  Depression screen PHQ 2/9  Decreased Interest 0 0  Down, Depressed, Hopeless 0 0  PHQ - 2 Score 0 0  Altered sleeping  0  Tired, decreased energy  0  Change in appetite  0  Feeling bad or failure about yourself   2  Trouble concentrating  0  Moving slowly or fidgety/restless  0  Suicidal thoughts  0  PHQ-9 Score  2     Review of Systems     Objective:   Physical Exam General No acute distress Mood and affect appropriate Left upper extremity is 3 - at the deltoid bicep tricep finger flexors and extensors 3-3 left hip flexor 4 - at the knee extensor and 2 - ankle dorsiflexor When standing he goes into left knee hyperextension Sensation is absent at the left thumb partial of the left little finger intact to light touch in the left lower extremity intact on the right side There is no evidence of aphasia or dysarthria No significant left neglect noted        Assessment & Plan:   1.  Right MCA distribution infarct with left upper extremity greater than extremity weakness as well as sensory deficit.  His left neglect has improved although cannot rule out some higher level issues with this. Continue outpatient PT OT speech I will see him back in 3 months Follow-up with PCP Follow-up with neurology

## 2023-10-14 ENCOUNTER — Ambulatory Visit: Admitting: Physical Therapy

## 2023-10-14 ENCOUNTER — Ambulatory Visit: Admitting: Occupational Therapy

## 2023-10-14 ENCOUNTER — Ambulatory Visit: Admitting: Speech Pathology

## 2023-10-14 DIAGNOSIS — R269 Unspecified abnormalities of gait and mobility: Secondary | ICD-10-CM

## 2023-10-14 DIAGNOSIS — I69354 Hemiplegia and hemiparesis following cerebral infarction affecting left non-dominant side: Secondary | ICD-10-CM

## 2023-10-14 DIAGNOSIS — M6281 Muscle weakness (generalized): Secondary | ICD-10-CM

## 2023-10-14 DIAGNOSIS — R278 Other lack of coordination: Secondary | ICD-10-CM

## 2023-10-14 DIAGNOSIS — H539 Unspecified visual disturbance: Secondary | ICD-10-CM

## 2023-10-14 DIAGNOSIS — R262 Difficulty in walking, not elsewhere classified: Secondary | ICD-10-CM

## 2023-10-14 DIAGNOSIS — R41841 Cognitive communication deficit: Secondary | ICD-10-CM | POA: Diagnosis not present

## 2023-10-14 DIAGNOSIS — R2681 Unsteadiness on feet: Secondary | ICD-10-CM

## 2023-10-14 NOTE — Therapy (Signed)
 OUTPATIENT PHYSICAL THERAPY NEURO TREATMENT   Patient Name: [name]. MRN: [id number] DOB:1950/10/11, 73 y.o., male Today's Date: 10/14/2023   PCP: Dennise Remak, MD  REFERRING PROVIDER:    Pegge Toribio PARAS, PA-C    END OF SESSION:    PT End of Session - 10/14/23 1402     Visit Number 18    Number of Visits 24    Date for PT Re-Evaluation 11/12/23    PT Start Time 1402    PT Stop Time 1448    PT Time Calculation (min) 46 min    Equipment Utilized During Treatment Gait belt    Activity Tolerance Patient tolerated treatment well    Behavior During Therapy WFL for tasks assessed/performed              Past Medical History:  Diagnosis Date   Anemia    Cervical myelopathy (HCC)    Chronic venous insufficiency    [name] (chronic kidney disease), stage III (HCC)    Diabetes mellitus without complication (HCC)    GERD (gastroesophageal reflux disease)    Hypercholesteremia    Hypothyroidism    Kidney stones    about 50 in the past--last in March 4/24   Leg weakness, bilateral    due to Myasthenia Gravis   Myasthenia gravis (HCC)    Spinal stenosis    Vitamin B 12 deficiency    Vitamin B 12 deficiency    Wears hearing aid    bilateral   Past Surgical History:  Procedure Laterality Date   BACK SURGERY  09/2015   CARDIAC CATHETERIZATION     CATARACT EXTRACTION W/ INTRAOCULAR LENS IMPLANT     CATARACT EXTRACTION W/PHACO Right 12/21/2015   Procedure: CATARACT EXTRACTION PHACO AND INTRAOCULAR LENS PLACEMENT (IOC);  Surgeon: Dene Etienne, MD;  Location: [facility];  Service: Ophthalmology;  Laterality: Right;  DIABETIC - insulin  and oral meds   COLONOSCOPY     SHOULDER SURGERY     labrum repair   TONSILLECTOMY     Patient Active Problem List   Diagnosis Date Noted   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) 10/11/2023   High blood pressure 08/13/2023   Anemia 08/13/2023   Constipation  08/13/2023   Myasthenia gravis (HCC) 08/13/2023   Acute ischemic right MCA stroke (HCC) 07/19/2023   Lymphedema 12/27/2016   Leg pain 07/15/2016   Chronic venous insufficiency 07/15/2016   Swelling of limb 07/15/2016   Type 2 diabetes mellitus (HCC) 07/15/2016   Hyperlipidemia 07/15/2016    ONSET DATE:   REFERRING DIAG:  Diagnosis  I63.511 (ICD-10-CM) - Cerebral infarction due to unspecified occlusion or stenosis of right middle cerebral artery    THERAPY DIAG:  Muscle weakness (generalized)  Other lack of coordination  Hemiplegia and hemiparesis following cerebral infarction affecting left non-dominant side (HCC)  Vision disturbance  Difficulty in walking, not elsewhere classified  Unsteadiness on feet  Abnormality of gait and mobility  Rationale for Evaluation and Treatment: Rehabilitation  SUBJECTIVE:  SUBJECTIVE STATEMENT:   Pt inquiring on whether or not he needs to continue wearing the Swedish knee cage. Pt reports he has been doing fine. Denies stumbles/falls. Pt states L shoulder pain is gone pretty much with pain only every once in a while if he tweaks it.    Pt's wife reports pt has difficult time lifting L LE to place up on w/c leg rest.   Pt reports his goal is to learn to walk again if at all possible, even if it is only 10 feet, and he wants to focus on walking during every PT session.     From Initial Eval: Pt reports that he is doing well. Has not been moving much since discharging from [facility], as therapists instructed pt to perform transfers only at home due to high fall risk with ambulation. Continues to have significant weakenss in the LUE with difficulty extending fingers as well as numbness and inattention to the L side of body resulting in multiple cuts on Arm  and leg with WC mobility in home.  Will be attaining custom power WC from [employer]. Has loaner currently.   Pt accompanied by: significant other [name]   PERTINENT HISTORY: [name]. is a 73 y.o. male with history of T2DM, HTN, [name], renal calculi, seropositive myasthenia gravis who was admitted to [facility] on 07/14/2023 with reports 3 to 4-day history of stuttering speech and fluctuating LUE weakness followed by fall.  He reported some double vision and per discussion with his neurology I had increased prednisone  to 10 mg/day due to concerns of MG flare.  He was found to have left facial weakness with dysarthria as well as LUE and LLE weakness.  MRI brain done revealing acute right MCA infarction involving right periventricular white matter and extending into right frontal and parietal lobes.    [name] [name]. was admitted to rehab 07/19/2023 for inpatient therapies to consist of PT, ST and OT at least three hours five days a week. Past admission physiatrist, therapy team and rehab RN have worked together to provide customized collaborative inpatient rehab.  He continues on low-dose aspirin  and subcu Lovenox  was used for DVT prophylaxis during his stay.  His blood pressures were monitored on TID basis and has been stable on current regimen.  Serial check of CBC shows H&H to be relatively stable stool guaiac was negative x 1.  He continues to have lower extremity edema and was educated on importance of elevation for edema control.  Constipation has resolved with sorbitol  use on intermittent basis.  P.o. intake has been good.  He was noted to have Candida crura's which was treated with course of Diflucan.  MASD has resolved with use of Gerhardt's Butt cream.   Left hemiplegia as been a limiting factor with left knee pain noted with increase in activity.  EKG was added for support and pain management.  His diabetes has been monitored with ac/hs CBG checks and SSI was use prn for tighter BS control.   Metformin  was resumed at 5 mg twice daily and basal insulin  was slowly titrated upwards to 26 units.  Blood sugars continue to be variable and he was advised to increase metformin  to home dose and titrate insulin  glargine slowly if blood sugars range over 150.  He has made steady gains during his stay and continues to be limited by mild left inattention with left knee plegia and weakness. He has been evaluated for  speciality wheelchair by [employer]. He will  continue to receive follow up outpatient PT, OT and ST at [facility] after discharge.   PAIN:  Are you having pain? No and reports Numbness in the L H and and Leg   PRECAUTIONS: Fall  **reports gets rash from contact with latex  RED FLAGS: Hx of MG   WEIGHT BEARING RESTRICTIONS: No  FALLS: Has patient fallen in last 6 months? Yes. Number of falls 1  LIVING ENVIRONMENT: Lives with: lives with their spouse Lives in: House/apartment Stairs: Ramps installed while in the Hospital  Has following equipment at home: Vannie - 2 wheeled and Wheelchair (power)  PLOF: Independent with basic ADLs, Independent with household mobility without device, and Independent with community mobility with device with [facility]   PATIENT GOALS: relearn to Walk 15-60ft.   OBJECTIVE:  Note: Objective measures were completed at Evaluation unless otherwise noted.  DIAGNOSTIC FINDINGS: MRI brain done revealing acute right MCA infarction involving right periventricular white matter and extending into right frontal and parietal lobes.  COGNITION: Overall cognitive status: Impaired poor awareness of L side while dual tasking.    SENSATION: Light touch: Impaired   COORDINATION: Limited ROM and strength on the LUE limiting finger to nose.  ROM and strength deficits limit ankle to knee.   EDEMA:  Mild edema in the L UE.   MUSCLE TONE: LLE: Mild    POSTURE: flexed trunk. Lateral lean to the L   LOWER EXTREMITY ROM:     Active  Right Eval  Left Eval  Hip flexion    Hip extension    Hip abduction    Hip adduction    Hip internal rotation    Hip external rotation    Knee flexion    Knee extension    Ankle dorsiflexion    Ankle plantarflexion    Ankle inversion    Ankle eversion     (Blank rows = not tested)  LOWER EXTREMITY MMT:    MMT Right Eval Left Eval  Hip flexion 4+ 4-  Hip extension 4+ 3+  Hip abduction 4+ 3+  Hip adduction 4+ 3+  Hip internal rotation    Hip external rotation    Knee flexion 5 4-  Knee extension 5 4-  Ankle dorsiflexion 5 4  Ankle plantarflexion    Ankle inversion    Ankle eversion    (Blank rows = not tested)  BED MOBILITY:  Findings: Sit to supine Mod A and Max A Supine to sit Mod A and Max A  TRANSFERS: Sit to stand: Min A and Mod A  Assistive device utilized: Environmental consultant - 2 wheeled     Stand to sit: Min A  Assistive device utilized: Environmental consultant - 2 wheeled     Chair to chair: Min A and Mod A  Assistive device utilized: Environmental consultant - 2 wheeled      PT required to stabilize the LUE on RW as well as provide max cues for attention to hand to prevent sliding off hand grip.  At home Pt utilizes hand splint for stability on RW to prevent loss of grip in transfers.   RAMP:  Not tested  CURB:  Not tested  STAIRS: Not tested GAIT: Findings: Gait Characteristics: step to pattern, decreased hip/knee flexion- Left, Left foot flat, ataxic, lateral lean- Left, wide BOS, and poor foot clearance- Left, Distance walked: 48ft, Assistive device utilized:Walker - 2 wheeled, Level of assistance: Mod A and Max A, and Comments: poor awareness of lateral/anterior L LOB as well as poor  awareness of LUE causing loos of grip on RW and heavy lateral LOB to the L, +2 assist required to prevent fall and position chair under patient as he attempted turn at 19ft.    FUNCTIONAL TESTS:  5 times sit to stand: 1:29 min Timed up and go (TUG): unable to complete turn at 47ft.  10 meter walk test: unable to complete on  this day.   PATIENT SURVEYS:  Stroke Impact Scale 49/80                                                                                                                              TREATMENT DATE: 10/14/2023  Pt arrives to session in transport chair.   Unless otherwise stated, min A was provided and gait belt donned in order to ensure pt safety throughout session.  Kept Swedish knee cage off at beginning of session to assess L knee positioning; however, pt continues to demonstrate excessive L knee hyperextension in standing and during transfers. Educated pt on recommendation for patient to wear Swedish knee cage for standing and transfers at home.  L stand pivot transport chair>EOM using bari-RW with L hand splint - skilled min A for coming to stand and then CGA for steadying balance while turning. Improved AD management noted as well to keep COM within BOS of BRW; however, cuing to bring it closer to him prior to initiating sitting at very end of transfer. Throughout session, provided repeated cuing to recall need to look at L hand when he comes to stand to ensure it is is properly placed on AD hand splint  Sit<>stand from elevated EOM without AD during the following standing balance interventions:  R/L trunk/head rotations 3x 3 reps Standing moving cones from R side of mat to L side of mat behind him to promote increased turning 5 cones x2 reps  Cuing to avoid L knee hyperextension and therapist facilitating rotating pelvis in more of a squat position *during sit<>stands, cued pt for increased anterior trunk weight shift and to avoid pushing backs of legs into seat to prevent tendency for posterior lean in initial standing  Seated EOM, L LE marches to place L foot on yoga block to simulate w/c leg rest x8 reps- pt continues to have difficulty bringing L foot back all the way when lifting due to decreased hamstring activation.  Donned L LE Swedish knee cage after noticing continued  excessive knee hyperextension in standing and limited ability to control.   Reciprocal foot tap to brown 5inch step with B UE support on bari-RW. Performed x12 reps with min assist for balance. Cuing to improve L foot placement when bringing it back off the step to avoid excessive adduction/scissoring in order to maintain wider BOS for improved balance. Also, cuing to avoid bringing L foot too far back behind his BOS. Goal of increasing pt's awareness of correct L foot placement with weaning of cues/feedback. Pt starts to fatigue with ability to  lift L foot to place on step.  Gait training ~165ft to OT gym with bari-RW, hand splint, and Swedish knee cage. CGA-min assist at most throughout all gait training. Pt noted to continue having improved AD management in turns, keeping COM within RW to reduce risk of LOB to the L; however, sometimes delayed. Pt able to clear L LE during swing, but often doesn't achieve heel strike and rather lands on midfoot - therapist cuing for pt to kick heel towards L anterior wheel of RW to improve BOS and step length because when pt advances L LE in hip/knee flexed position it often leads to narrow BOS.  Pt able to recall proper motor planning when turning to sit in chair on his L side as was practiced with this therapist previously, requiring only min cuing.    PATIENT EDUCATION: Education details: Pt educated throughout session about proper posture and technique with exercises. Improved exercise technique, movement at target joints, use of target muscles after min to mod verbal, visual, tactile cues.  Person educated: Patient and Spouse Education method: Explanation, VC/TC/Demo Education comprehension: verbalized understanding  HOME EXERCISE PROGRAM:  Access Code: [email or web address] URL: [email or web address] Date: 09/11/2023 Prepared by: Massie Dollar  Exercises - Seated Knee Extension AROM  - 1 x daily - 4 x weekly - 3 sets - 10 reps - Seated Hip  Abduction  - 1 x daily - 4 x weekly - 3 sets - 10 reps - Seated March  - 1 x daily - 4 x weekly - 3 sets - 10 reps - Sit to Stand with Counter Support  - 1 x daily - 4 x weekly - 3 sets - 10 reps - Supine Short Arc Quad  - 1 x daily - 4 x weekly - 3 sets - 10 reps - Supine Hip Abduction  - 1 x daily - 4 x weekly - 3 sets - 10 reps - Supine Bridge  - 1 x daily - 4 x weekly - 3 sets - 3 reps - 2sec  hold  GOALS: Goals reviewed with patient? Yes  SHORT TERM GOALS: Target date: 09/25/2023    Patient will be independent in home exercise program to improve strength/mobility for better functional independence with ADLs. Baseline: Initiated on 09/11/2023 Goal status: IN PROGRESS   LONG TERM GOALS: Target date: 11/13/2023    Patient will increase SIS-16 score to equal to or greater than 10points to demonstrate statistically significant improvement in mobility and quality of life.  Baseline: 49/80 09/19/2023: 45/80 Goal status: IN PROGRESS  2.  Patient (> 7 years old) will complete five times sit to stand test in < 15 seconds indicating an increased LE strength and improved balance. Baseline: 1:48min 09/19/2023: 56.39 seconds using R UE support to push-up from armrest of green chair and requiring skilled min A for balance and lifting/lowering  Goal status: IN PROGRESS  3.  Patient will increase FIST score by > 6 points to demonstrate decreased fall risk during functional activities Baseline:  43/56  09/23/2023: 53/56 Goal status: MET and UPGRADED  09/23/2023: Patient will increase Berg Balance score to > 45/56 to demonstrate improved balance and decreased fall risk during functional activities and ADLs.  Baseline: 09/23/2023: 14/56 Goal status: IN PROGRESS  4.  Patient will increase 10 meter walk test to >0.60m/s as to improve gait speed for better community ambulation and to reduce fall risk. Baseline: 09/09/2023: 0.103 m/s ( and 37 seconds) using bari-RW with skilled heavy min A and +2  w/c  follow for safety  09/19/2023: 0.50m/s using bari-RW with skilled min A of 1 and +2 w/c follow for safety Goal status: IN PROGRESS  5.  Patient will reduce timed up and go to <11 seconds to reduce fall risk and demonstrate improved transfer/gait ability. Baseline: unable to complete turn at 10 ft. Mod-max assist due to poor control of LW on RW, resulting in L lateral LOB  09/09/2023: and 32 seconds using bari-RW with L hand splint and +2 on R side for safety, therapist providing skilled min A on L side (Had pt set-up L hand on RW splint, get L foot in proper positioning, and scoot forward in seat before starting the timer) 09/19/2023: 30min43 seconds using bari-RW with only skilled min A of 1 (Had pt set-up L hand on RW splint, get L foot in proper positioning, and scoot forward in seat before starting the timer) 7/2: 78sec(1:18.125) with RW and min assist overall.  Goal status: IN PROGRESS   ASSESSMENT:  CLINICAL IMPRESSION:   Patient is a 73 y.o. male who was seen today for physical therapy treatment for balance, coordination, and gait deficits following CVA in April. Therapy session continued to focus on improved midline orientation, improved dynamic standing balance, and improved L LE coordination. Patient continues to demonstrate L knee hyperextension during standing and transfers without Swedish knee cage; therefore, recommended patient continue wearing this during those activities, even at home. Patient continues to demo tendency to place L LE too far towards midline/adducted during NMR activities, but improving although quickly fatigable L hip/knee flexion to perform standing foot taps to step or seated LE lifts to simulate placing L foot on w/c footrests. Patient demonstrates significant improvement with gait training requiring only CGA/light min A. Patient will benefit from continued hands-on family training to initiate controlled environment gait training at home with family's  assistance. Pt will continue to benefit from skilled physical therapy interventions to address above impairments, improve QOL, and attain therapy goals.     OBJECTIVE IMPAIRMENTS: Abnormal gait, cardiopulmonary status limiting activity, decreased activity tolerance, decreased balance, decreased cognition, decreased coordination, decreased endurance, decreased knowledge of condition, decreased knowledge of use of DME, decreased mobility, difficulty walking, decreased ROM, decreased strength, decreased safety awareness, dizziness, hypomobility, increased fascial restrictions, impaired perceived functional ability, increased muscle spasms, impaired sensation, impaired tone, impaired UE functional use, impaired vision/preception, improper body mechanics, postural dysfunction, and obesity.   ACTIVITY LIMITATIONS: carrying, lifting, bending, sitting, standing, squatting, stairs, transfers, bed mobility, bathing, toileting, dressing, reach over head, hygiene/grooming, locomotion level, and caring for others  PARTICIPATION LIMITATIONS: meal prep, cleaning, laundry, medication management, interpersonal relationship, driving, shopping, community activity, and yard work  PERSONAL FACTORS: Age, Past/current experiences, Time since onset of injury/illness/exacerbation, and 3+ comorbidities: MG, HTN, CVA, lymphedema are also affecting patient's functional outcome.   REHAB POTENTIAL: Good  CLINICAL DECISION MAKING: Unstable/unpredictable  EVALUATION COMPLEXITY: High  PLAN:  PT FREQUENCY: 1-2x/week  PT DURATION: 12 weeks  PLANNED INTERVENTIONS: 97164- PT Re-evaluation, 97750- Physical Performance Testing, 97110-Therapeutic exercises, 97530- Therapeutic activity, [id number]- Neuromuscular re-education, 97535- Self Care, [id number]- Manual therapy, [id number]- Gait training, [id number]- Orthotic Initial, [id number]- Orthotic/Prosthetic subsequent, [phone]- Canalith repositioning, H9716- Electrical stimulation (unattended), [phone]-  Electrical stimulation (manual), [id number]- Wound care (first 20 sq cm), 97598- Wound care (each additional 20 sq cm), Patient/Family education, Balance training, Stair training, Taping, Dry Needling, Joint mobilization, Joint manipulation, Vestibular training, Visual/preceptual remediation/compensation, Cognitive remediation, DME instructions, Wheelchair mobility training, Cryotherapy, and Moist heat  PLAN FOR NEXT SESSION:  Gait training with L hand splint on bari-RW  Continue progression of turning through doorways and turning to sit in chair at end of gait (bias having chair on his L for increased challenge) as well as navigating around obstacles Continue reinforcing pt management of L hand placement on/off hand splint during transfers to hopefully reach mod-I stand pivot transfer level at home prior to reaching ambulatory level Cue pt to pull AD towards him with L hand when transitioning sit>stand Reinforce education on wearing Swedish knee cage at home Family education for gait in simulated home environment.    Connell Kiss, PT, DPT, NCS, CSRS Physical Therapist - [place]  [facility]  2:50 PM 10/14/23

## 2023-10-14 NOTE — Therapy (Cosign Needed)
 OCCUPATIONAL THERAPY NEURO TREATMENT NOTE  Patient Name: [name]. MRN: [id number] DOB:[birth year], 73 y.o., male Today's Date: 10/14/2023  PCP: Alm Rower, MD REFERRING PROVIDER: Hermann Toribio PARAS, PA-C  END OF SESSION:  OT End of Session - 10/14/23 1743     Visit Number 16    Number of Visits 24    Date for OT Re-Evaluation 11/12/23    OT Start Time 1445    OT Stop Time 1530    OT Time Calculation (min) 45 min    Activity Tolerance Patient tolerated treatment well    Behavior During Therapy WFL for tasks assessed/performed          Past Medical History:  Diagnosis Date   Anemia    Cervical myelopathy (HCC)    Chronic venous insufficiency    [name] (chronic kidney disease), stage III (HCC)    Diabetes mellitus without complication (HCC)    GERD (gastroesophageal reflux disease)    Hypercholesteremia    Hypothyroidism    Kidney stones    about 50 in the past--last in March 4/24   Leg weakness, bilateral    due to Myasthenia Gravis   Myasthenia gravis (HCC)    Spinal stenosis    Vitamin B 12 deficiency    Vitamin B 12 deficiency    Wears hearing aid    bilateral   Past Surgical History:  Procedure Laterality Date   BACK SURGERY  09/2015   CARDIAC CATHETERIZATION     CATARACT EXTRACTION W/ INTRAOCULAR LENS IMPLANT     CATARACT EXTRACTION W/PHACO Right 12/21/2015   Procedure: CATARACT EXTRACTION PHACO AND INTRAOCULAR LENS PLACEMENT (IOC);  Surgeon: Dene Etienne, MD;  Location: [facility];  Service: Ophthalmology;  Laterality: Right;  DIABETIC - insulin  and oral meds   COLONOSCOPY     SHOULDER SURGERY     labrum repair   TONSILLECTOMY     Patient Active Problem List   Diagnosis Date Noted   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) 10/11/2023   High blood pressure 08/13/2023   Anemia 08/13/2023   Constipation 08/13/2023   Myasthenia gravis (HCC) 08/13/2023   Acute ischemic right MCA stroke  (HCC) 07/19/2023   Lymphedema 12/27/2016   Leg pain 07/15/2016   Chronic venous insufficiency 07/15/2016   Swelling of limb 07/15/2016   Type 2 diabetes mellitus (HCC) 07/15/2016   Hyperlipidemia 07/15/2016   ONSET DATE: 07/13/2023  REFERRING DIAG: Acute ischemic R MCA CVA  THERAPY DIAG: muscle weakness (generalized), other lack of coordination, vision disturbance, hemiplegia and hemiparesis following cerebral infarction affecting left non-dominant side (HCC)  Rationale for Evaluation and Treatment: Rehabilitation  SUBJECTIVE:  SUBJECTIVE STATEMENT: Pt reports that he has been attempting to use his L hand during self-feeding tasks.  Pt accompanied by: significant other  PERTINENT HISTORY: Pt. Was admitted to [facility] on 07/13/2023 after sustaining a CVA while on a trip to the beach. Pt. Was diagnosed with R MCA CVA. Pt. Was admitted to inpatient rehab from 07/19/2023-08/13/2023. Past medical history includes high BP, anemia, and Myasthenia Gravis.   PRECAUTIONS: None  WEIGHT BEARING RESTRICTIONS: No  PAIN:   10/14/2023: No pain 10/07/23: 4/10 pain L shoulder Are you having pain?  10/01/2023- Yes, 7/10 in L shoulder due to most recent fall that occurred this past Friday. 5/10 L wrist  FALLS: Has patient fallen in last 6 months? Yes  LIVING ENVIRONMENT: Lives with: lives with their family and lives with their spouse-  Lives  in: House/apartment- 2 story home and resides mostly on the 1st floor Stairs: No- Ramped entrance Has following equipment at home:  Vannie, cane, Steadi, shower chair, handheld shower head, bedside commode  PLOF: Independent and Independent with basic ADLs Independent at home   PATIENT GOALS: Would like to walk 10-20 feet each time.   OBJECTIVE:  Note: Objective measures were completed at Evaluation unless otherwise noted.  HAND DOMINANCE: Right  ADLs: Overall ADLs:  Transfers/ambulation related to ADLs: Eating: Independent, 100% R handed Grooming:  independent UB Dressing: Can put on shirt independently LB Dressing: Difficulty with pants and requires assistance getting the pants on his feet, has difficulty putting on shoes and socks Toileting: Tot A for toilet hygiene. Uses Steadi during toilet transfers. Bathing: Requires assist with using the L arm to wash the R arm. Tub Shower transfers: Roll in shower, built in shower chair, grab bars and hand held shower head.  IADLs: Shopping: Total A (Wife typically did the shopping prior to onset)   Light housekeeping: Total A (wife typically performs prior to onset) Meal Prep: Independent with light snack prep Community mobility: No driving, able to get in/out of the car Medication management: Set up assistance from his wife using a pill box (pt. Is responsible for taking medications at the correct time) Financial management: family members check behind him. Handwriting: TBD Hobbies: gardening, wood working and sports- [employer] Work history: Owns a tax business MOBILITY STATUS: Needs Assist:    POSTURE COMMENTS:   Sitting balance: Good supported sitting balance  FUNCTIONAL OUTCOME MEASURES: TBD   UPPER EXTREMITY ROM:    Active ROM Right Eval  [facility] Right 09/23/2023 [facility] Left eval Left 09/23/2023  Shoulder flexion   89(110) 109(114)  Shoulder abduction   98(120) 109(120)  Shoulder adduction      Shoulder extension      Shoulder internal rotation      Shoulder external rotation      Elbow flexion   120(140) 130(136)  Elbow extension   -28(-10) -5(0)  Wrist flexion   60(60) Rest at 60 50(65)  Wrist extension   -48(42) 10(40)  Wrist ulnar deviation      Wrist radial deviation      Wrist pronation      Wrist supination      (Blank rows = not tested)  Eval: L Digit flexion: 2nd:3cm (0cm) 3rd: 0cm (0cm), 4th: 0cm (0cm), 5th: 2cm (0cm)  09/23/23:  L Digit flexion to [facility]: 2nd: 3.5cm (0cm), 3rd: 4 cm(0cm), 4th 3.5cm (0cm), 5th: 2cm (0cm)  Eval:  L Digit extension: Active  Gross digit extension through 10% of ROM  09/23/23:  L Digit extension: Active Gross digit extension through 50% of ROM   UPPER EXTREMITY MMT:     MMT Right eval Left eval Left 09/23/2023  Shoulder flexion 5 3- 3-/5  Shoulder abduction 5 3- 3-/5  Shoulder adduction     Shoulder extension     Shoulder internal rotation     Shoulder external rotation     Middle trapezius     Lower trapezius     Elbow flexion 5 TBD 3+/5  Elbow extension 5 TBD 4-/5  Wrist flexion   2/5  Wrist extension  2- 2/5  Wrist ulnar deviation     Wrist radial deviation     Wrist pronation     Wrist supination     (Blank rows = not tested)  HAND FUNCTION:   Grip strength:   Right: 94#, Left: 1#,  Lateral Key Pinch strength:   Right: 14#, Left: 3#  COORDINATION:  Box and Blocks: Right: 34 blocks completed in 1 min., Left: 0 blocks completed in 1 min.   SENSATION: Sensation feels different in both hands.  L hand Light touch- impaired L hand Proprioception- impaired  EDEMA: Has had hx of edema, and came to Eval session with swelling in the L hand/wrist/forearm.   *pt. Has a laceration from his dog on the dorsal aspect of the L hand.   MUSCLE TONE: Increased tone through the UE and hand flexors.   COGNITION: Overall cognitive status: Within functional limits for tasks assessed  VISION: Subjective report: L sided inattention Baseline vision: Wears glasses for reading only Visual history: Right visual occlusion  VISION ASSESSMENT: To be assessed  PERCEPTION: TBD  PRAXIS: Impaired                                                                                                                 TREATMENT DATE: 10/14/2023  Self-Care:  -Pt. performed hand to mouth patterns of movement in preparation of attempting to use a built up spoon and a swivel spoon.  -Pt. performed scooping with a spoon using 1/2 beads from sectional plate. -Pt. practiced movements needed in preparation for  self-feeding with his right hand, including closing his fist and grasping the spoon from tabletop surface.  Manual Therapy:  -Pt. Tolerated retrograde massage to the Left hand for edema control 2/2 increased edema.      PATIENT EDUCATION: Education details:  Benefits of [place]  Person educated: Patient and spouse Education method: Explanation and Verbal cues Education comprehension: verbalized understanding and needs further education  HOME EXERCISE PROGRAM: Self passive stretching throughout the LUE; grasp/release activities, wrist strengthening   GOALS: Goals reviewed with patient? Yes  SHORT TERM GOALS: Target date: 10/01/2023   Pt. Will be independent with HEP for the LUE.  Baseline: 09/19/23: Requires assist from his wife Eval; no current HEP Goal status: Ongoing   LONG TERM GOALS: Target date: 11/12/2023  Pt. Will increase L shoulder flexion by 10 degrees to be able to reach up to shelves.  Baseline: 09/19/23: Pt. Continues to be limited with reaching up to shelves. Eval: L shoulder flexion is 89 (110). Goal status: Ongoing  2.  Pt. Will increase L shoulder abduction by 10 degrees to assist with underarm hygiene.  Baseline: 09/19/23: Pt. Continues to require assist with underarm hygiene.Eval: L shoulder abduction is 98 (120).  Goal status: Ongoing  3.  Pt. Will improve L wrist extension by 10 degrees to be able to initiate anticipation of grasping for objects.  Baseline: 09/19/23: Consistent activation noted in the left wrist extensors with facilitation. Eval: -48 (42) Goal status: Ongoing  4.  Pt. Will improve active gross digit extension through 50% of the range to be able to release objects in his hand consistently.  Baseline: 09/19/23: gross digit extension noted through 25% range with facilitation.Eval: gross digit extension is 10% of the range  Goal status: Goal updated  to 50% of the range  5.  Pt. Will independently demonstrate visual compensatory strategies when  navigating through environment and during tabletop tasks. Baseline: 09/19/23: Pt. Requires fewer cues for left sided awareness Eval: Pt. Requires consistent cuing for L sided awareness.  Goal status: Ongoing  ASSESSMENT:  CLINICAL IMPRESSION: Pt. Presents with increased edema in the hand today. Pt. Denies pain during treatment session today. Pt. Tolerated Retrograde massage to L hand 2/2 to increased edema. Spouse reports that pt. Has used his L hand to grasp soda cans at home, as well as attempting to use L hand while self-feeding using a spoon. Pt. Reports that he has had difficulty keeping food on the spoon, Pt. And spouse receptive to adaptive utensils used during treatment session today. Pt. Required proximal support at the L elbow and wrist, requiring Max A for self-feeding simulation using swivel and built up spoon to scoop beads from the sectional plate. Pt. Required Mod vc for proper L hand placement on utensils. Pt. Is able to initiate and grasp when picking up utensil, however, requires Mod A to stabilize elbow and wrist when bringing utensil to the mouth. Pt. Has difficulty sustaining grasp onto utensil using L hand and requires re positioning and min vc to grasp utensil. Pt. Was able to perform 50-75% to formulate a composite fist depending on intermittent fatigue. Pt continues to benefit from OT services to work on improving LUE functioning to increase engagement of the LUE during daily task, and to provide education about compensatory strategies during ADLs/IADLs.  PERFORMANCE DEFICITS: in functional skills including ADLs, IADLs, coordination, dexterity, proprioception, sensation, edema, tone, ROM, strength, pain, Fine motor control, Gross motor control, mobility, balance, endurance, vision, and UE functional use, cognitive skills including perception, and psychosocial skills including coping strategies, environmental adaptation, and routines and behaviors.   IMPAIRMENTS: are limiting  patient from ADLs, IADLs, and leisure.   CO-MORBIDITIES: may have co-morbidities  that affects occupational performance. Patient will benefit from skilled OT to address above impairments and improve overall function.  MODIFICATION OR ASSISTANCE TO COMPLETE EVALUATION: Min-Moderate modification of tasks or assist with assess necessary to complete an evaluation.  OT OCCUPATIONAL PROFILE AND HISTORY: Detailed assessment: Review of records and additional review of physical, cognitive, psychosocial history related to current functional performance.  CLINICAL DECISION MAKING: Moderate - several treatment options, min-mod task modification necessary  REHAB POTENTIAL: Good  EVALUATION COMPLEXITY: Moderate    PLAN:  OT FREQUENCY: 2x/week  OT DURATION: 12 weeks  PLANNED INTERVENTIONS: 97168 OT Re-evaluation, 97535 self care/ADL training, 02889 therapeutic exercise, 97530 therapeutic activity, 97112 neuromuscular re-education, 97140 manual therapy, 97018 paraffin, 02989 moist heat, 97010 cryotherapy, 97034 contrast bath, 97760 Orthotic Initial, 97763 Orthotic/Prosthetic subsequent, passive range of motion, visual/perceptual remediation/compensation, energy conservation, and DME and/or AE instructions  RECOMMENDED OTHER SERVICES: PT and ST  CONSULTED AND AGREED WITH PLAN OF CARE: Patient and family member/caregiver  PLAN FOR NEXT SESSION: see above  Damien Nap, OTS

## 2023-10-17 ENCOUNTER — Ambulatory Visit: Admitting: Occupational Therapy

## 2023-10-17 ENCOUNTER — Ambulatory Visit: Admitting: Physical Therapy

## 2023-10-17 DIAGNOSIS — H539 Unspecified visual disturbance: Secondary | ICD-10-CM

## 2023-10-17 DIAGNOSIS — R278 Other lack of coordination: Secondary | ICD-10-CM

## 2023-10-17 DIAGNOSIS — R41841 Cognitive communication deficit: Secondary | ICD-10-CM | POA: Diagnosis not present

## 2023-10-17 DIAGNOSIS — M6281 Muscle weakness (generalized): Secondary | ICD-10-CM

## 2023-10-17 DIAGNOSIS — R269 Unspecified abnormalities of gait and mobility: Secondary | ICD-10-CM

## 2023-10-17 DIAGNOSIS — R2681 Unsteadiness on feet: Secondary | ICD-10-CM

## 2023-10-17 DIAGNOSIS — R262 Difficulty in walking, not elsewhere classified: Secondary | ICD-10-CM

## 2023-10-17 DIAGNOSIS — I69354 Hemiplegia and hemiparesis following cerebral infarction affecting left non-dominant side: Secondary | ICD-10-CM

## 2023-10-17 NOTE — Therapy (Addendum)
 OCCUPATIONAL THERAPY NEURO TREATMENT NOTE  Patient Name: [name]. MRN: [id number] DOB:October 19, 1950, 73 y.o., male Today's Date: 10/17/2023  PCP: Alm Rower, MD REFERRING PROVIDER: Hermann Toribio PARAS, PA-C  END OF SESSION:  OT End of Session - 10/17/23 1414     Visit Number 17    Number of Visits 24    Date for OT Re-Evaluation 11/12/23    OT Start Time 0930    OT Stop Time 1020    OT Time Calculation (min) 50 min    Activity Tolerance Patient tolerated treatment well    Behavior During Therapy WFL for tasks assessed/performed           Past Medical History:  Diagnosis Date   Anemia    Cervical myelopathy (HCC)    Chronic venous insufficiency    [name] (chronic kidney disease), stage III (HCC)    Diabetes mellitus without complication (HCC)    GERD (gastroesophageal reflux disease)    Hypercholesteremia    Hypothyroidism    Kidney stones    about 50 in the past--last in March 4/24   Leg weakness, bilateral    due to Myasthenia Gravis   Myasthenia gravis (HCC)    Spinal stenosis    Vitamin B 12 deficiency    Vitamin B 12 deficiency    Wears hearing aid    bilateral   Past Surgical History:  Procedure Laterality Date   BACK SURGERY  09/2015   CARDIAC CATHETERIZATION     CATARACT EXTRACTION W/ INTRAOCULAR LENS IMPLANT     CATARACT EXTRACTION W/PHACO Right 12/21/2015   Procedure: CATARACT EXTRACTION PHACO AND INTRAOCULAR LENS PLACEMENT (IOC);  Surgeon: Dene Etienne, MD;  Location: [facility];  Service: Ophthalmology;  Laterality: Right;  DIABETIC - insulin  and oral meds   COLONOSCOPY     SHOULDER SURGERY     labrum repair   TONSILLECTOMY     Patient Active Problem List   Diagnosis Date Noted   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) 10/11/2023   High blood pressure 08/13/2023   Anemia 08/13/2023   Constipation 08/13/2023   Myasthenia gravis (HCC) 08/13/2023   Acute ischemic right MCA  stroke (HCC) 07/19/2023   Lymphedema 12/27/2016   Leg pain 07/15/2016   Chronic venous insufficiency 07/15/2016   Swelling of limb 07/15/2016   Type 2 diabetes mellitus (HCC) 07/15/2016   Hyperlipidemia 07/15/2016   ONSET DATE: 07/13/2023  REFERRING DIAG: Acute ischemic R MCA CVA  THERAPY DIAG: muscle weakness (generalized), other lack of coordination, vision disturbance, hemiplegia and hemiparesis following cerebral infarction affecting left non-dominant side (HCC)  Rationale for Evaluation and Treatment: Rehabilitation  SUBJECTIVE:  SUBJECTIVE STATEMENT: Pt reports that he has been attempting to use his L hand during self-feeding tasks.  Pt accompanied by: significant other  PERTINENT HISTORY: Pt. Was admitted to [facility] on 07/13/2023 after sustaining a CVA while on a trip to the beach. Pt. Was diagnosed with R MCA CVA. Pt. Was admitted to inpatient rehab from 07/19/2023-08/13/2023. Past medical history includes high BP, anemia, and Myasthenia Gravis.   PRECAUTIONS: None  WEIGHT BEARING RESTRICTIONS: No  PAIN:   10/14/2023: No pain 10/07/23: 4/10 pain L shoulder Are you having pain?  10/01/2023- Yes, 7/10 in L shoulder due to most recent fall that occurred this past Friday. 5/10 L wrist  FALLS: Has patient fallen in last 6 months? Yes  LIVING ENVIRONMENT: Lives with: lives with their family and lives with their spouse-  Lives in: House/apartment- 2 story home and resides mostly on the 1st floor Stairs: No- Ramped entrance Has following equipment at home:  Vannie, cane, Steadi, shower chair, handheld shower head, bedside commode  PLOF: Independent and Independent with basic ADLs Independent at home   PATIENT GOALS: Would like to walk 10-20 feet each time.   OBJECTIVE:  Note: Objective measures were completed at Evaluation unless otherwise noted.  HAND DOMINANCE: Right  ADLs: Overall ADLs:  Transfers/ambulation related to ADLs: Eating: Independent, 100% R  handed Grooming: independent UB Dressing: Can put on shirt independently LB Dressing: Difficulty with pants and requires assistance getting the pants on his feet, has difficulty putting on shoes and socks Toileting: Tot A for toilet hygiene. Uses Steadi during toilet transfers. Bathing: Requires assist with using the L arm to wash the R arm. Tub Shower transfers: Roll in shower, built in shower chair, grab bars and hand held shower head.  IADLs: Shopping: Total A (Wife typically did the shopping prior to onset)   Light housekeeping: Total A (wife typically performs prior to onset) Meal Prep: Independent with light snack prep Community mobility: No driving, able to get in/out of the car Medication management: Set up assistance from his wife using a pill box (pt. Is responsible for taking medications at the correct time) Financial management: family members check behind him. Handwriting: TBD Hobbies: gardening, wood working and sports- [employer] Work history: Owns a tax business MOBILITY STATUS: Needs Assist:    POSTURE COMMENTS:   Sitting balance: Good supported sitting balance  FUNCTIONAL OUTCOME MEASURES: TBD   UPPER EXTREMITY ROM:    Active ROM Right Eval  [facility] Right 09/23/2023 [facility] Left eval Left 09/23/2023  Shoulder flexion   89(110) 109(114)  Shoulder abduction   98(120) 109(120)  Shoulder adduction      Shoulder extension      Shoulder internal rotation      Shoulder external rotation      Elbow flexion   120(140) 130(136)  Elbow extension   -28(-10) -5(0)  Wrist flexion   60(60) Rest at 60 50(65)  Wrist extension   -48(42) 10(40)  Wrist ulnar deviation      Wrist radial deviation      Wrist pronation      Wrist supination      (Blank rows = not tested)  Eval: L Digit flexion: 2nd:3cm (0cm) 3rd: 0cm (0cm), 4th: 0cm (0cm), 5th: 2cm (0cm)  09/23/23:  L Digit flexion to [facility]: 2nd: 3.5cm (0cm), 3rd: 4 cm(0cm), 4th 3.5cm (0cm), 5th: 2cm (0cm)  Eval:  L Digit  extension: Active Gross digit extension through 10% of ROM  09/23/23:  L Digit extension: Active Gross digit extension through 50% of ROM   UPPER EXTREMITY MMT:     MMT Right eval Left eval Left 09/23/2023  Shoulder flexion 5 3- 3-/5  Shoulder abduction 5 3- 3-/5  Shoulder adduction     Shoulder extension     Shoulder internal rotation     Shoulder external rotation     Middle trapezius     Lower trapezius     Elbow flexion 5 TBD 3+/5  Elbow extension 5 TBD 4-/5  Wrist flexion   2/5  Wrist extension  2- 2/5  Wrist ulnar deviation     Wrist radial deviation     Wrist pronation     Wrist supination     (Blank rows = not tested)  HAND FUNCTION:   Grip strength:   Right: 94#, Left:  1#,   Lateral Key Pinch strength:   Right: 14#, Left: 3#  COORDINATION:  Box and Blocks: Right: 34 blocks completed in 1 min., Left: 0 blocks completed in 1 min.   SENSATION: Sensation feels different in both hands.  L hand Light touch- impaired L hand Proprioception- impaired  EDEMA: Has had hx of edema, and came to Eval session with swelling in the L hand/wrist/forearm.   *pt. Has a laceration from his dog on the dorsal aspect of the L hand.   MUSCLE TONE: Increased tone through the UE and hand flexors.   COGNITION: Overall cognitive status: Within functional limits for tasks assessed  VISION: Subjective report: L sided inattention Baseline vision: Wears glasses for reading only Visual history: Right visual occlusion  VISION ASSESSMENT: To be assessed  PERCEPTION: TBD  PRAXIS: Impaired                                                                                                                 TREATMENT DATE: 10/17/2023  Self-Care:  -Pt. performed hand to mouth patterns of movement in preparation of attempting to use a built up spoon/fork. -Pt. Performed reps of  scooping with a built up spoon, scooping up 1/2 beads from a sectional plate requiring tapering hand  over hand assist. -Pt. Practiced components of movements needed for self- feeding tasks including reps  of grasping the built up spoon from tabletop surface, as well as reps of securing  his grips on the utensils. -Assessed several adaptive utensils using the L hand, built up fork/knife, and large handled fork, and rocker knife, as well as attempting to position standardized fork/spoon/knife in the L hand.  -Pt. worked on Occupational hygienist of cutting food. Pt. worked on using a built Programme researcher, broadcasting/film/video for stabilizing, and spearing pink theraputty with the L hand while using a table knife to cut food with the right hand.    Manual Therapy:  -Pt. Tolerated retrograde massage to the Left hand for edema control 2/2 increased edema from the distal tips of the digits through the hand and into the wrist, and forearm.  Therapeutic Ex -LUE AAROM/AROM/PROM was performed with slow prolonged gentle stretching in shoulder flexion/abduction, elbow flexion and extension, wrist flexion/extension, and digit extension/flexion.      PATIENT EDUCATION: Education details:  Benefits of adaptive utensils, built up utensils, grasping/positioning of utensils, hand over hand assistance with reps of scooping food with L hand at home, Saebo E-Stim unit Person educated: Patient and spouse Education method: Explanation and Verbal cues Education comprehension: verbalized understanding and needs further education  HOME EXERCISE PROGRAM: Hand over hand assistance at the wrist and elbow with reps of scooping food using L hand at home.  GOALS: Goals reviewed with patient? Yes  SHORT TERM GOALS: Target date: 10/01/2023   Pt. Will be independent with HEP for the LUE.  Baseline: 09/19/23: Requires assist from his wife Eval; no current HEP Goal status: Ongoing   LONG TERM GOALS: Target date: 11/12/2023  Pt. Will increase L shoulder  flexion by 10 degrees to be able to reach up to shelves.  Baseline: 09/19/23: Pt. Continues  to be limited with reaching up to shelves. Eval: L shoulder flexion is 89 (110). Goal status: Ongoing  2.  Pt. Will increase L shoulder abduction by 10 degrees to assist with underarm hygiene.  Baseline: 09/19/23: Pt. Continues to require assist with underarm hygiene.Eval: L shoulder abduction is 98 (120).  Goal status: Ongoing  3.  Pt. Will improve L wrist extension by 10 degrees to be able to initiate anticipation of grasping for objects.  Baseline: 09/19/23: Consistent activation noted in the left wrist extensors with facilitation. Eval: -48 (42) Goal status: Ongoing  4.  Pt. Will improve active gross digit extension through 50% of the range to be able to release objects in his hand consistently.  Baseline: 09/19/23: gross digit extension noted through 25% range with facilitation.Eval: gross digit extension is 10% of the range  Goal status: Goal updated to 50% of the range  5.  Pt. Will independently demonstrate visual compensatory strategies when navigating through environment and during tabletop tasks. Baseline: 09/19/23: Pt. Requires fewer cues for left sided awareness Eval: Pt. Requires consistent cuing for L sided awareness.  Goal status: Ongoing  ASSESSMENT:  CLINICAL IMPRESSION: Pt. and spouse arrived to treatment session today and brought Saebo E-stim device as well as instructions for use of device. Pt/caregiver education was provided about the placement of the Saebo EStim for Left wrist, and digit extension. The Saebo unit needed to be charged, therefore could not be applied the Pt. this visit. Pt. presents with increased pitting edema in the L hand today. Pt. Presents with 8/10 shoulder pain when demonstrating L elbow extension into L elbow flexion. Pt. Tolerated Retrograde massage to L hand with reduced edema in the hand following treatment. Pt. Reports that he has been incorporating his L hand during self feeding tasks, using L hand to stabilize plate Pt. Reports that he has had dif  and using the R hand to spear food. Pt. Reports that built up spoon/fork is easier to maneuver when spearing food, or scooping beads from sectional plate. Pt. Requires less vc to grasp onto built up utensils during self-feeding task today. Pt. And spouse receptive to adaptive utensils used during treatment session today. Pt. Required proximal support at the L elbow and wrist, requiring Mod A for self-feeding simulation using  built up spoon to scoop beads from the sectional plate. Pt. required Mod vc for proper L hand placement on adaptive fork to avoid injury while cutting pink theraputty during treatment session. Pt. Is able to initiate and grasp when picking up utensil from tabletop surface, however, requires Mod A to stabilize elbow and wrist when bringing utensil to the mouth.  Pt. Uses R hand to adjust utensil in the L hand in preparation to scooping beads from sectional plate. Pt. required Mod A to perform supination/pronation during self-feeding tasks. Pt continues to benefit from OT services to work on improving LUE functioning to increase engagement of the LUE during daily task, and to provide education about compensatory strategies during ADLs/IADLs.  PERFORMANCE DEFICITS: in functional skills including ADLs, IADLs, coordination, dexterity, proprioception, sensation, edema, tone, ROM, strength, pain, Fine motor control, Gross motor control, mobility, balance, endurance, vision, and UE functional use, cognitive skills including perception, and psychosocial skills including coping strategies, environmental adaptation, and routines and behaviors.   IMPAIRMENTS: are limiting patient from ADLs, IADLs, and leisure.   CO-MORBIDITIES: may have co-morbidities  that affects  occupational performance. Patient will benefit from skilled OT to address above impairments and improve overall function.  MODIFICATION OR ASSISTANCE TO COMPLETE EVALUATION: Min-Moderate modification of tasks or assist with assess  necessary to complete an evaluation.  OT OCCUPATIONAL PROFILE AND HISTORY: Detailed assessment: Review of records and additional review of physical, cognitive, psychosocial history related to current functional performance.  CLINICAL DECISION MAKING: Moderate - several treatment options, min-mod task modification necessary  REHAB POTENTIAL: Good  EVALUATION COMPLEXITY: Moderate    PLAN:  OT FREQUENCY: 2x/week  OT DURATION: 12 weeks  PLANNED INTERVENTIONS: 97168 OT Re-evaluation, 97535 self care/ADL training, 02889 therapeutic exercise, 97530 therapeutic activity, 97112 neuromuscular re-education, 97140 manual therapy, 97018 paraffin, 02989 moist heat, 97010 cryotherapy, 97034 contrast bath, 97760 Orthotic Initial, 97763 Orthotic/Prosthetic subsequent, passive range of motion, visual/perceptual remediation/compensation, energy conservation, and DME and/or AE instructions  RECOMMENDED OTHER SERVICES: PT and ST  CONSULTED AND AGREED WITH PLAN OF CARE: Patient and family member/caregiver  PLAN FOR NEXT SESSION: see above  Damien Nap, OTS   This entire session was performed under direct supervision and direction of a licensed therapist/therapist assistant . I have personally read, edited and approve of the note as written.  Elaine Jagentenfl, MS, OTR/L   10/15/2023

## 2023-10-17 NOTE — Therapy (Signed)
 OUTPATIENT PHYSICAL THERAPY NEURO TREATMENT   Patient Name: [name]. MRN: [id number] DOB:1950/10/10, 73 y.o., male Today's Date: 10/17/2023   PCP: Dennise Remak, MD  REFERRING PROVIDER:    Pegge Toribio PARAS, PA-C    END OF SESSION:    PT End of Session - 10/17/23 0927     Visit Number 19    Number of Visits 24    Date for PT Re-Evaluation 11/12/23    PT Start Time 1015    PT Stop Time 1055    PT Time Calculation (min) 40 min    Equipment Utilized During Treatment Gait belt    Activity Tolerance Patient tolerated treatment well    Behavior During Therapy WFL for tasks assessed/performed              Past Medical History:  Diagnosis Date   Anemia    Cervical myelopathy (HCC)    Chronic venous insufficiency    [name] (chronic kidney disease), stage III (HCC)    Diabetes mellitus without complication (HCC)    GERD (gastroesophageal reflux disease)    Hypercholesteremia    Hypothyroidism    Kidney stones    about 50 in the past--last in March 4/24   Leg weakness, bilateral    due to Myasthenia Gravis   Myasthenia gravis (HCC)    Spinal stenosis    Vitamin B 12 deficiency    Vitamin B 12 deficiency    Wears hearing aid    bilateral   Past Surgical History:  Procedure Laterality Date   BACK SURGERY  09/2015   CARDIAC CATHETERIZATION     CATARACT EXTRACTION W/ INTRAOCULAR LENS IMPLANT     CATARACT EXTRACTION W/PHACO Right 12/21/2015   Procedure: CATARACT EXTRACTION PHACO AND INTRAOCULAR LENS PLACEMENT (IOC);  Surgeon: Dene Etienne, MD;  Location: [facility];  Service: Ophthalmology;  Laterality: Right;  DIABETIC - insulin  and oral meds   COLONOSCOPY     SHOULDER SURGERY     labrum repair   TONSILLECTOMY     Patient Active Problem List   Diagnosis Date Noted   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) 10/11/2023   High blood pressure 08/13/2023   Anemia 08/13/2023   Constipation  08/13/2023   Myasthenia gravis (HCC) 08/13/2023   Acute ischemic right MCA stroke (HCC) 07/19/2023   Lymphedema 12/27/2016   Leg pain 07/15/2016   Chronic venous insufficiency 07/15/2016   Swelling of limb 07/15/2016   Type 2 diabetes mellitus (HCC) 07/15/2016   Hyperlipidemia 07/15/2016    ONSET DATE:   REFERRING DIAG:  Diagnosis  I63.511 (ICD-10-CM) - Cerebral infarction due to unspecified occlusion or stenosis of right middle cerebral artery    THERAPY DIAG:  Muscle weakness (generalized)  Other lack of coordination  Hemiplegia and hemiparesis following cerebral infarction affecting left non-dominant side (HCC)  Vision disturbance  Difficulty in walking, not elsewhere classified  Unsteadiness on feet  Abnormality of gait and mobility  Rationale for Evaluation and Treatment: Rehabilitation  SUBJECTIVE:  SUBJECTIVE STATEMENT:   Noted to have increased swelling on the LLE. Reports that he has 2-3 open sores/wounds on the L foot/aknle. Has been seen by MD and just finished antibiotic. Has not been evaluated by would care.    No pain reported on this day. States that the shoulder is not bothering him much anymore, but is occasionally having pain and stretching sensation on the R forearm when grasping RW.      Pt reports his goal is to learn to walk again if at all possible, even if it is only 10 feet, and he wants to focus on walking during every PT session.     From Initial Eval: Pt reports that he is doing well. Has not been moving much since discharging from [facility], as therapists instructed pt to perform transfers only at home due to high fall risk with ambulation. Continues to have significant weakenss in the LUE with difficulty extending fingers as well as numbness and inattention to  the L side of body resulting in multiple cuts on Arm and leg with WC mobility in home.  Will be attaining custom power WC from [employer]. Has loaner currently.   Pt accompanied by: significant other [name]   PERTINENT HISTORY: Wright Douglas. is a 73 y.o. male with history of T2DM, HTN, [name], renal calculi, seropositive myasthenia gravis who was admitted to [facility] on 07/14/2023 with reports 3 to 4-day history of stuttering speech and fluctuating LUE weakness followed by fall.  He reported some double vision and per discussion with his neurology I had increased prednisone  to 10 mg/day due to concerns of MG flare.  He was found to have left facial weakness with dysarthria as well as LUE and LLE weakness.  MRI brain done revealing acute right MCA infarction involving right periventricular white matter and extending into right frontal and parietal lobes.    [name] [name]. was admitted to rehab 07/19/2023 for inpatient therapies to consist of PT, ST and OT at least three hours five days a week. Past admission physiatrist, therapy team and rehab RN have worked together to provide customized collaborative inpatient rehab.  He continues on low-dose aspirin  and subcu Lovenox  was used for DVT prophylaxis during his stay.  His blood pressures were monitored on TID basis and has been stable on current regimen.  Serial check of CBC shows H&H to be relatively stable stool guaiac was negative x 1.  He continues to have lower extremity edema and was educated on importance of elevation for edema control.  Constipation has resolved with sorbitol  use on intermittent basis.  P.o. intake has been good.  He was noted to have Candida crura's which was treated with course of Diflucan.  MASD has resolved with use of Gerhardt's Butt cream.   Left hemiplegia as been a limiting factor with left knee pain noted with increase in activity.  EKG was added for support and pain management.  His diabetes has been monitored with ac/hs  CBG checks and SSI was use prn for tighter BS control.  Metformin  was resumed at 5 mg twice daily and basal insulin  was slowly titrated upwards to 26 units.  Blood sugars continue to be variable and he was advised to increase metformin  to home dose and titrate insulin  glargine slowly if blood sugars range over 150.  He has made steady gains during his stay and continues to be limited by mild left inattention with left knee plegia and weakness. He has been evaluated for  speciality wheelchair by [employer]. He will continue to receive follow up outpatient PT, OT and ST at [facility] after discharge.   PAIN:  Are you having pain? No and reports Numbness in the L H and and Leg   PRECAUTIONS: Fall  **reports gets rash from contact with latex  RED FLAGS: Hx of MG   WEIGHT BEARING RESTRICTIONS: No  FALLS: Has patient fallen in last 6 months? Yes. Number of falls 1  LIVING ENVIRONMENT: Lives with: lives with their spouse Lives in: House/apartment Stairs: Ramps installed while in the Hospital  Has following equipment at home: Vannie - 2 wheeled and Wheelchair (power)  PLOF: Independent with basic ADLs, Independent with household mobility without device, and Independent with community mobility with device with [facility]   PATIENT GOALS: relearn to Walk 15-63ft.   OBJECTIVE:  Note: Objective measures were completed at Evaluation unless otherwise noted.  DIAGNOSTIC FINDINGS: MRI brain done revealing acute right MCA infarction involving right periventricular white matter and extending into right frontal and parietal lobes.  COGNITION: Overall cognitive status: Impaired poor awareness of L side while dual tasking.    SENSATION: Light touch: Impaired   COORDINATION: Limited ROM and strength on the LUE limiting finger to nose.  ROM and strength deficits limit ankle to knee.   EDEMA:  Mild edema in the L UE.   MUSCLE TONE: LLE: Mild    POSTURE: flexed trunk. Lateral lean to the L    LOWER EXTREMITY ROM:     Active  Right Eval Left Eval  Hip flexion    Hip extension    Hip abduction    Hip adduction    Hip internal rotation    Hip external rotation    Knee flexion    Knee extension    Ankle dorsiflexion    Ankle plantarflexion    Ankle inversion    Ankle eversion     (Blank rows = not tested)  LOWER EXTREMITY MMT:    MMT Right Eval Left Eval  Hip flexion 4+ 4-  Hip extension 4+ 3+  Hip abduction 4+ 3+  Hip adduction 4+ 3+  Hip internal rotation    Hip external rotation    Knee flexion 5 4-  Knee extension 5 4-  Ankle dorsiflexion 5 4  Ankle plantarflexion    Ankle inversion    Ankle eversion    (Blank rows = not tested)  BED MOBILITY:  Findings: Sit to supine Mod A and Max A Supine to sit Mod A and Max A  TRANSFERS: Sit to stand: Min A and Mod A  Assistive device utilized: Environmental consultant - 2 wheeled     Stand to sit: Min A  Assistive device utilized: Environmental consultant - 2 wheeled     Chair to chair: Min A and Mod A  Assistive device utilized: Environmental consultant - 2 wheeled      PT required to stabilize the LUE on RW as well as provide max cues for attention to hand to prevent sliding off hand grip.  At home Pt utilizes hand splint for stability on RW to prevent loss of grip in transfers.   RAMP:  Not tested  CURB:  Not tested  STAIRS: Not tested GAIT: Findings: Gait Characteristics: step to pattern, decreased hip/knee flexion- Left, Left foot flat, ataxic, lateral lean- Left, wide BOS, and poor foot clearance- Left, Distance walked: 64ft, Assistive device utilized:Walker - 2 wheeled, Level of assistance: Mod A and Max A, and Comments: poor awareness of  lateral/anterior L LOB as well as poor awareness of LUE causing loos of grip on RW and heavy lateral LOB to the L, +2 assist required to prevent fall and position chair under patient as he attempted turn at 45ft.    FUNCTIONAL TESTS:  5 times sit to stand: 1:29 min Timed up and go (TUG): unable to complete turn  at 48ft.  10 meter walk test: unable to complete on this day.   PATIENT SURVEYS:  Stroke Impact Scale 49/80                                                                                                                              TREATMENT DATE: 10/17/2023  Pt arrives to session in transport chair.   Unless otherwise stated, min A was provided and gait belt donned in order to ensure pt safety throughout session.  Knee cage remained donned on this day.  Sit<>stand 2 x 8 with LUE on RW for fisrt bout and HHA onteh LUE on the second bouts RW. Improved power and anteior weight shift noted with HHA for transfer compared to LUE on RW   Gait with RW , hand splint and knee cage through figure 8, cones at 36ft apart, 2 laps x 2 bouts Removed knee cage due to increased risk of skin breakdown with significant pitting edema on the LLE on this day. Encouraged pt to notify MD if edema does not improve as well as improve sleeping position to limit extended time with BLE In dependent position  Side stepping R and L 79ft x 3 with RW, but no knee cage. Tactile cues from PT to reduce GR with intermittent improvement improvement but also demonstrating mild buckling when GR not present in stance.    Improved sequencing and motor planning with transfers on this day. But requires moderate cues for safety for turns to the R due to continued mild LLE inattention.    PATIENT EDUCATION: Education details: Pt educated throughout session about proper posture and technique with exercises. Improved exercise technique, movement at target joints, use of target muscles after min to mod verbal, visual, tactile cues.  Person educated: Patient and Spouse Education method: Explanation, VC/TC/Demo Education comprehension: verbalized understanding  HOME EXERCISE PROGRAM:  Access Code: [email or web address] URL: [email or web address] Date: 09/11/2023 Prepared by: Massie Dollar  Exercises - Seated Knee Extension AROM   - 1 x daily - 4 x weekly - 3 sets - 10 reps - Seated Hip Abduction  - 1 x daily - 4 x weekly - 3 sets - 10 reps - Seated March  - 1 x daily - 4 x weekly - 3 sets - 10 reps - Sit to Stand with Counter Support  - 1 x daily - 4 x weekly - 3 sets - 10 reps - Supine Short Arc Quad  - 1 x daily - 4 x weekly - 3 sets - 10 reps - Supine Hip Abduction  - 1 x daily -  4 x weekly - 3 sets - 10 reps - Supine Bridge  - 1 x daily - 4 x weekly - 3 sets - 3 reps - 2sec  hold  GOALS: Goals reviewed with patient? Yes  SHORT TERM GOALS: Target date: 09/25/2023    Patient will be independent in home exercise program to improve strength/mobility for better functional independence with ADLs. Baseline: Initiated on 09/11/2023 Goal status: IN PROGRESS   LONG TERM GOALS: Target date: 11/13/2023    Patient will increase SIS-16 score to equal to or greater than 10points to demonstrate statistically significant improvement in mobility and quality of life.  Baseline: 49/80 09/19/2023: 45/80 Goal status: IN PROGRESS  2.  Patient (> 43 years old) will complete five times sit to stand test in < 15 seconds indicating an increased LE strength and improved balance. Baseline: 1:63min 09/19/2023: 56.39 seconds using R UE support to push-up from armrest of green chair and requiring skilled min A for balance and lifting/lowering  Goal status: IN PROGRESS  3.  Patient will increase FIST score by > 6 points to demonstrate decreased fall risk during functional activities Baseline:  43/56  09/23/2023: 53/56 Goal status: MET and UPGRADED  09/23/2023: Patient will increase Berg Balance score to > 45/56 to demonstrate improved balance and decreased fall risk during functional activities and ADLs.  Baseline: 09/23/2023: 14/56 Goal status: IN PROGRESS  4.  Patient will increase 10 meter walk test to >0.36m/s as to improve gait speed for better community ambulation and to reduce fall risk. Baseline: 09/09/2023: 0.103 m/s ( and 37  seconds) using bari-RW with skilled heavy min A and +2 w/c follow for safety  09/19/2023: 0.25m/s using bari-RW with skilled min A of 1 and +2 w/c follow for safety Goal status: IN PROGRESS  5.  Patient will reduce timed up and go to <11 seconds to reduce fall risk and demonstrate improved transfer/gait ability. Baseline: unable to complete turn at 10 ft. Mod-max assist due to poor control of LW on RW, resulting in L lateral LOB  09/09/2023: and 32 seconds using bari-RW with L hand splint and +2 on R side for safety, therapist providing skilled min A on L side (Had pt set-up L hand on RW splint, get L foot in proper positioning, and scoot forward in seat before starting the timer) 09/19/2023: 60min43 seconds using bari-RW with only skilled min A of 1 (Had pt set-up L hand on RW splint, get L foot in proper positioning, and scoot forward in seat before starting the timer) 7/2: 78sec(1:18.125) with RW and min assist overall.  Goal status: IN PROGRESS   ASSESSMENT:  CLINICAL IMPRESSION:   Patient is a 73 y.o. male who was seen today for physical therapy treatment for balance, coordination, and gait deficits following CVA in April. Therapy session continued to focus on improved midline orientation, improved dynamic standing balance, and improved L LE coordination. Was noted to have increased edema on the LLE on this day, causing pitting from knee cage after ~ 25 minutes of wear. Patient continues to demonstrate L knee hyperextension during standing and transfers without Swedish knee cage; despite pitting edema with knee cage; continued to recommended patient wearing this during those activities, even at home.  Focused on simulated home ambulation on this day to navigate tight spaces with 180deg turns and side stepping to simulate movement through bathroom and along the EOB. Tolerated well with only min cues and assist for safety of AD placement and attention to the  LLE with fatigue.  Pt will continue to  benefit from skilled physical therapy interventions to address above impairments, improve QOL, and attain therapy goals.     OBJECTIVE IMPAIRMENTS: Abnormal gait, cardiopulmonary status limiting activity, decreased activity tolerance, decreased balance, decreased cognition, decreased coordination, decreased endurance, decreased knowledge of condition, decreased knowledge of use of DME, decreased mobility, difficulty walking, decreased ROM, decreased strength, decreased safety awareness, dizziness, hypomobility, increased fascial restrictions, impaired perceived functional ability, increased muscle spasms, impaired sensation, impaired tone, impaired UE functional use, impaired vision/preception, improper body mechanics, postural dysfunction, and obesity.   ACTIVITY LIMITATIONS: carrying, lifting, bending, sitting, standing, squatting, stairs, transfers, bed mobility, bathing, toileting, dressing, reach over head, hygiene/grooming, locomotion level, and caring for others  PARTICIPATION LIMITATIONS: meal prep, cleaning, laundry, medication management, interpersonal relationship, driving, shopping, community activity, and yard work  PERSONAL FACTORS: Age, Past/current experiences, Time since onset of injury/illness/exacerbation, and 3+ comorbidities: MG, HTN, CVA, lymphedema are also affecting patient's functional outcome.   REHAB POTENTIAL: Good  CLINICAL DECISION MAKING: Unstable/unpredictable  EVALUATION COMPLEXITY: High  PLAN:  PT FREQUENCY: 1-2x/week  PT DURATION: 12 weeks  PLANNED INTERVENTIONS: [id number]- PT Re-evaluation, 97750- Physical Performance Testing, 97110-Therapeutic exercises, 97530- Therapeutic activity, [id number]- Neuromuscular re-education, 97535- Self Care, [id number]- Manual therapy, [id number]- Gait training, [phone]- Orthotic Initial, [phone]- Orthotic/Prosthetic subsequent, [phone]- Canalith repositioning, [id number]- Electrical stimulation (unattended), [phone]- Electrical stimulation (manual), [id number]-  Wound care (first 20 sq cm), 97598- Wound care (each additional 20 sq cm), Patient/Family education, Balance training, Stair training, Taping, Dry Needling, Joint mobilization, Joint manipulation, Vestibular training, Visual/preceptual remediation/compensation, Cognitive remediation, DME instructions, Wheelchair mobility training, Cryotherapy, and Moist heat  PLAN FOR NEXT SESSION:  Gait training with L hand splint on bari-RW  Continue progression of turning through doorways and turning to sit in chair at end of gait (bias having chair on his L for increased challenge) as well as navigating around obstacles Continue reinforcing pt management of L hand placement on/off hand splint during transfers to hopefully reach mod-I stand pivot transfer level at home prior to reaching ambulatory level Cue pt to pull AD towards him with L hand when transitioning sit>stand Reinforce education on wearing Swedish knee cage at home Family education for gait in simulated home environment.   Massie Dollar PT, DPT  Physical Therapist - [facility]  10:06 AM 10/17/23

## 2023-10-22 ENCOUNTER — Ambulatory Visit: Admitting: Occupational Therapy

## 2023-10-22 ENCOUNTER — Ambulatory Visit: Admitting: Physical Therapy

## 2023-10-22 ENCOUNTER — Ambulatory Visit: Admitting: Speech Pathology

## 2023-10-22 DIAGNOSIS — M6281 Muscle weakness (generalized): Secondary | ICD-10-CM

## 2023-10-22 DIAGNOSIS — R278 Other lack of coordination: Secondary | ICD-10-CM

## 2023-10-22 DIAGNOSIS — R41841 Cognitive communication deficit: Secondary | ICD-10-CM | POA: Diagnosis not present

## 2023-10-22 DIAGNOSIS — H539 Unspecified visual disturbance: Secondary | ICD-10-CM

## 2023-10-22 DIAGNOSIS — R262 Difficulty in walking, not elsewhere classified: Secondary | ICD-10-CM

## 2023-10-22 DIAGNOSIS — I69354 Hemiplegia and hemiparesis following cerebral infarction affecting left non-dominant side: Secondary | ICD-10-CM

## 2023-10-22 DIAGNOSIS — R2681 Unsteadiness on feet: Secondary | ICD-10-CM

## 2023-10-22 DIAGNOSIS — R269 Unspecified abnormalities of gait and mobility: Secondary | ICD-10-CM

## 2023-10-22 NOTE — Therapy (Signed)
 OUTPATIENT SPEECH LANGUAGE PATHOLOGY  COGNITION TREATMENT NOTE   Patient Name: [name]. MRN: [id number] DOB:11-18-1950, 73 y.o., male Today's Date: 10/22/2023  PCP: Douglas Stank, MD REFERRING PROVIDER: Toribio pitch, PA-C    End of Session - 10/22/23 0929     Visit Number 15    Number of Visits 25    Date for SLP Re-Evaluation 11/12/23    Authorization Type [employer]    Authorization Time Period 10/22/2023 thru 10/30/2023    Authorization - Visit Number 1    Authorization - Number of Visits 8    Progress Note Due on Visit 20    SLP Start Time 0930    SLP Stop Time  1015    SLP Time Calculation (min) 45 min    Activity Tolerance Patient tolerated treatment well           Past Medical History:  Diagnosis Date   Anemia    Cervical myelopathy (HCC)    Chronic venous insufficiency    [name] (chronic kidney disease), stage III (HCC)    Diabetes mellitus without complication (HCC)    GERD (gastroesophageal reflux disease)    Hypercholesteremia    Hypothyroidism    Kidney stones    about 50 in the past--last in March 4/24   Leg weakness, bilateral    due to Myasthenia Gravis   Myasthenia gravis (HCC)    Spinal stenosis    Vitamin B 12 deficiency    Vitamin B 12 deficiency    Wears hearing aid    bilateral   Past Surgical History:  Procedure Laterality Date   BACK SURGERY  09/2015   CARDIAC CATHETERIZATION     CATARACT EXTRACTION W/ INTRAOCULAR LENS IMPLANT     CATARACT EXTRACTION W/PHACO Right 12/21/2015   Procedure: CATARACT EXTRACTION PHACO AND INTRAOCULAR LENS PLACEMENT (IOC);  Surgeon: Douglas Etienne, MD;  Location: [facility];  Service: Ophthalmology;  Laterality: Right;  DIABETIC - insulin  and oral meds   COLONOSCOPY     SHOULDER SURGERY     labrum repair   TONSILLECTOMY     Patient Active Problem List   Diagnosis Date Noted   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)  10/11/2023   High blood pressure 08/13/2023   Anemia 08/13/2023   Constipation 08/13/2023   Myasthenia gravis (HCC) 08/13/2023   Acute ischemic right MCA stroke (HCC) 07/19/2023   Lymphedema 12/27/2016   Leg pain 07/15/2016   Chronic venous insufficiency 07/15/2016   Swelling of limb 07/15/2016   Type 2 diabetes mellitus (HCC) 07/15/2016   Hyperlipidemia 07/15/2016    ONSET DATE: 07/19/2023   REFERRING DIAG: P36.488 (ICD-10-CM) - Cerebral infarction due to unspecified occlusion or stenosis of right middle cerebral artery  THERAPY DIAG:  Cognitive communication deficit  Rationale for Evaluation and Treatment Rehabilitation  SUBJECTIVE:   PERTINENT HISTORY and DIAGNOSTIC FINDINGS:   [name] is a 73 y.o. 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  CT Brain w/o contrast: 07/14/2023 New hypodensity within the right periventricular white matter extending into the inferior frontal lobe/operculum compatible with a recent infarct.   MRI Brain w/o contrast: 07/15/2023 IMPRESSION:  Acute right MCA distribution infarct involving the right periventricular white matter extending into the right frontal and parietal lobes and insula , some of which correlates to the  recent CT finding. No significant associated mass effect or associated hemorrhage.   MRA head angiogram 07/16/2023 IMPRESSION:  Focal occlusion of the right inferior division distal M2 MCA (6:123) in keeping with known right MCA infarct when compared to prior MRI brain 07/15/2023.   CT head 07/17/2023 IMPRESSION:  Expansion of territory involved in the right MCA infarct compared with MRI of 07/15/2023. Increasing local mass effect with new leftward midline shift of 3 mm. No hemorrhage.   Speech Language Evaluation at [facility]  07/19/2023 Patient presents with cognitive deficits in the areas of problem solving and left inattention as evidenced throughout patient/family interview and performance on Cognistat evaluation. Patient further demonstrates impairments in the areas of intellectual, anticipatory, and emergent awareness of deficits/mistakes.   Speech Language Evaluation at [facility] 07/31/2023 Skilled therapy session focused on re-evaluation of cognitive skills utilizing the CLQT standardized assessment. Patient scored with overall mild cognitive deficits in the subtests of attention, executive functioning and visual spatial skills. Patient scored WFL on the clock drawing task, language and memory.   PAIN:  Are you having pain? No   FALLS: Has patient fallen in last 6 months?  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 SLP facilitated session by providing the following interventions:  For the last couple of weeks, I really started feeling almost like myself  He reports that he continues to use compensatory memory strategies such as putting callers on speak to have his wife also listen as well writing down information. Wife also confirms that he is doing these task independently.   Pt given task to monitor time to alternate attention between tasks with 10 minute increments. He set timer on his cell phone independently.   Constant Therapy Clinician App utilized to target higher level  attention and memory.  Find the alternating symbol: Level 8 - pt demonstrated good accuracy (achieving 90%) with improved efficiency once cued to use line by line visual scanning N-Back Level 2 - pt began with very good accuracy but his accuracy rapidly decreased to 72% when this writer began talking to his wife - education provided on increased distractibility and need to reduce distractions during important tasks or movement tasks. Both voiced understanding.   PATIENT EDUCATION: Education details: see above Person educated: Patient and Spouse Education method: Explanation Education comprehension: needs further education   HOME EXERCISE PROGRAM:   See above for details    GOALS:  Goals reviewed with patient? Yes  SHORT TERM GOALS: Target date: 10 sessions  Updated: 09/25/2023 The patient will complete a simple word search puzzle (66 or smaller) within 10 minutes given intermittent moderate verbal cues. Baseline: Goal status: INITIAL: MET  2.  The patient will recall sentence-level information after a 5-minute delay using the spaced retrieval technique. Baseline:  Goal status: INITIAL: MET  3.  With moderate cues, pt will identify 3 cognitive strengths/weaknesses during tasks.  Baseline:  Goal status: INITIAL: MET  4. Pt will complete semi-complex reasoning task with supervision A to achieve > 90% accuracy.  Baseline:   Goal  status: INITIAL  5. Pt will recall 2-3 compensatory memory strategies that he has implemented during functional activity over 3 out of 5 session. Baseline: Goal Status: INITIAL.     LONG TERM GOALS: Target date: 11/12/2023  Updated: 09/25/2023 The patient will read sentence-level information aloud at 80% accuracy given intermittent minimal verbal cues in order to increase ability to safely live independently. Baseline:  Goal status: INITIAL: MET  2.   With Min A, patient will use external aid for functional recall of completed/upcoming activities  75% accuracy.     Baseline:  Goal status: INITIAL: MET  3.   With Min A, patient will complete semi-complex visual memory tasks with 75% accuracy independently.   Baseline:  Goal status: INITIAL: MET  4.  With Supervision A, patient will use strategies (ie., white board, daily planner/calendar, Apps on phone) to improve memory for important information with 80% accuracy.  Baseline: Goal status: INITIAL    ASSESSMENT:  CLINICAL IMPRESSION: Patient is a 73 y.o. right handed male who was seen today for a cognitive communication treatment d/t right MCA CVA. Pt presents with overall  moderate cognitive impairment with severe deficits in delayed memory, severe deficits in visuospatial/constructional abilities (apart from left inattention), decreased awareness of deficits/errors and mild deficits in higher level attention. Immediate memory, language and selective attention are relative strengths for pt.   Pt continues with improved abilities within ST sessions as well as report of improved ability within functional tasks at home.  See the above treatment note for details.   OBJECTIVE IMPAIRMENTS include attention, memory, awareness, and executive functioning. These impairments are limiting patient from return to work, managing medications, managing appointments, managing finances, household responsibilities, and ADLs/IADLs. Factors affecting potential to achieve goals and functional outcome are severity of impairments. Patient will benefit from skilled SLP services to address above impairments and improve overall function.  REHAB POTENTIAL: Good  PLAN: SLP FREQUENCY: 1-2x/week  SLP DURATION: 12 weeks  PLANNED INTERVENTIONS: Cognitive reorganization, Internal/external aids, Functional tasks, SLP instruction and feedback, Compensatory strategies, and Patient/family education   Divine Hansley B. Rubbie, M.S., CCC-SLP, Tree surgeon Certified Brain Injury Specialist [facility]   [facility] Office [phone] Ascom [phone] Fax [phone]

## 2023-10-22 NOTE — Therapy (Signed)
 OUTPATIENT PHYSICAL THERAPY NEURO TREATMENT  Physical Therapy Progress Note   Dates of reporting period  09/19/2023   to   10/22/2023   Patient Name: [name]. MRN: [id number] DOB:December 16, 1950, 73 y.o., male Today's Date: 10/22/2023   PCP: Douglas Remak, MD  REFERRING PROVIDER:    Pegge Toribio PARAS, PA-C    END OF SESSION:  ***  PT End of Session - 10/22/23 1008     Visit Number 20    Number of Visits 24    Date for PT Re-Evaluation 11/12/23    PT Start Time 1016    PT Stop Time 1102    PT Time Calculation (min) 46 min    Equipment Utilized During Treatment Gait belt    Activity Tolerance Patient tolerated treatment well    Behavior During Therapy WFL for tasks assessed/performed               Past Medical History:  Diagnosis Date   Anemia    Cervical myelopathy (HCC)    Chronic venous insufficiency    [name] (chronic kidney disease), stage III (HCC)    Diabetes mellitus without complication (HCC)    GERD (gastroesophageal reflux disease)    Hypercholesteremia    Hypothyroidism    Kidney stones    about 50 in the past--last in March 4/24   Leg weakness, bilateral    due to Myasthenia Gravis   Myasthenia gravis (HCC)    Spinal stenosis    Vitamin B 12 deficiency    Vitamin B 12 deficiency    Wears hearing aid    bilateral   Past Surgical History:  Procedure Laterality Date   BACK SURGERY  09/2015   CARDIAC CATHETERIZATION     CATARACT EXTRACTION W/ INTRAOCULAR LENS IMPLANT     CATARACT EXTRACTION W/PHACO Right 12/21/2015   Procedure: CATARACT EXTRACTION PHACO AND INTRAOCULAR LENS PLACEMENT (IOC);  Surgeon: Douglas Etienne, MD;  Location: [facility];  Service: Ophthalmology;  Laterality: Right;  DIABETIC - insulin  and oral meds   COLONOSCOPY     SHOULDER SURGERY     labrum repair   TONSILLECTOMY     Patient Active Problem List   Diagnosis Date Noted   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) 10/11/2023   High blood pressure 08/13/2023   Anemia 08/13/2023   Constipation 08/13/2023   Myasthenia gravis (HCC) 08/13/2023   Acute ischemic right MCA stroke (HCC) 07/19/2023   Lymphedema 12/27/2016   Leg pain 07/15/2016   Chronic venous insufficiency 07/15/2016   Swelling of limb 07/15/2016   Type 2 diabetes mellitus (HCC) 07/15/2016   Hyperlipidemia 07/15/2016    ONSET DATE:   REFERRING DIAG:  Diagnosis  I63.511 (ICD-10-CM) - Cerebral infarction due to unspecified occlusion or stenosis of right middle cerebral artery    THERAPY DIAG:  Muscle weakness (generalized)  Other lack of coordination  Hemiplegia and hemiparesis following cerebral infarction affecting left non-dominant side (HCC)  Vision disturbance  Difficulty in walking, not elsewhere classified  Unsteadiness on feet  Abnormality of gait and mobility  Rationale for Evaluation and Treatment: Rehabilitation  SUBJECTIVE:  SUBJECTIVE STATEMENT:   Pt states he is doing pretty good. Pt states the wounds on L LE are healing, but pt has been referred to wound clinic, but pt has not heard from them yet to set-up initial appointment. Denies pain to start session. Denies stumbles/falls at home. *** Pt states he has not yet tried to ambulate at home. *** Pt states he has not been wearing Swedish knee cage at home during transfers because he feels it takes more time than it is worth. Pt states he has been elevating his legs at night, which pt reports has improved the swelling.  ***    Pt reports his goal is to learn to walk again if at all possible, even if it is only 10 feet, and he wants to focus on walking during every PT session.     From Initial Eval: Pt reports that he is doing well. Has not been moving  much since discharging from [facility], as therapists instructed pt to perform transfers only at home due to high fall risk with ambulation. Continues to have significant weakenss in the LUE with difficulty extending fingers as well as numbness and inattention to the L side of body resulting in multiple cuts on Arm and leg with WC mobility in home.  Will be attaining custom power WC from [employer]. Has loaner currently.   Pt accompanied by: significant other [name]   PERTINENT HISTORY: [name]. is a 73 y.o. male with history of T2DM, HTN, [name], renal calculi, seropositive myasthenia gravis who was admitted to [facility] on 07/14/2023 with reports 3 to 4-day history of stuttering speech and fluctuating LUE weakness followed by fall.  He reported some double vision and per discussion with his neurology I had increased prednisone  to 10 mg/day due to concerns of MG flare.  He was found to have left facial weakness with dysarthria as well as LUE and LLE weakness.  MRI brain done revealing acute right MCA infarction involving right periventricular white matter and extending into right frontal and parietal lobes.    Douglas LITTIE [name]. was admitted to rehab 07/19/2023 for inpatient therapies to consist of PT, ST and OT at least three hours five days a week. Past admission physiatrist, therapy team and rehab RN have worked together to provide customized collaborative inpatient rehab.  He continues on low-dose aspirin  and subcu Lovenox  was used for DVT prophylaxis during his stay.  His blood pressures were monitored on TID basis and has been stable on current regimen.  Serial check of CBC shows H&H to be relatively stable stool guaiac was negative x 1.  He continues to have lower extremity edema and was educated on importance of elevation for edema control.  Constipation has resolved with sorbitol  use on intermittent basis.  P.o. intake has been good.  He was noted to have Candida crura's which was treated with course of  Diflucan.  MASD has resolved with use of Gerhardt's Butt cream.   Left hemiplegia as been a limiting factor with left knee pain noted with increase in activity.  EKG was added for support and pain management.  His diabetes has been monitored with ac/hs CBG checks and SSI was use prn for tighter BS control.  Metformin  was resumed at 5 mg twice daily and basal insulin  was slowly titrated upwards to 26 units.  Blood sugars continue to be variable and he was advised to increase metformin  to home dose and titrate insulin  glargine slowly if blood sugars range over  150.  He has made steady gains during his stay and continues to be limited by mild left inattention with left knee plegia and weakness. He has been evaluated for  speciality wheelchair by [employer]. He will continue to receive follow up outpatient PT, OT and ST at [facility] after discharge.   PAIN:  Are you having pain? No and reports Numbness in the L H and and Leg   PRECAUTIONS: Fall  **reports gets rash from contact with latex  RED FLAGS: Hx of MG   WEIGHT BEARING RESTRICTIONS: No  FALLS: Has patient fallen in last 6 months? Yes. Number of falls 1  LIVING ENVIRONMENT: Lives with: lives with their spouse Lives in: House/apartment Stairs: Ramps installed while in the Hospital  Has following equipment at home: Vannie - 2 wheeled and Wheelchair (power)  PLOF: Independent with basic ADLs, Independent with household mobility without device, and Independent with community mobility with device with [facility]   PATIENT GOALS: relearn to Walk 15-57ft.   OBJECTIVE:  Note: Objective measures were completed at Evaluation unless otherwise noted.  DIAGNOSTIC FINDINGS: MRI brain done revealing acute right MCA infarction involving right periventricular white matter and extending into right frontal and parietal lobes.  COGNITION: Overall cognitive status: Impaired poor awareness of L side while dual tasking.    SENSATION: Light touch:  Impaired   COORDINATION: Limited ROM and strength on the LUE limiting finger to nose.  ROM and strength deficits limit ankle to knee.   EDEMA:  Mild edema in the L UE.   MUSCLE TONE: LLE: Mild    POSTURE: flexed trunk. Lateral lean to the L   LOWER EXTREMITY ROM:     Active  Right Eval Left Eval  Hip flexion    Hip extension    Hip abduction    Hip adduction    Hip internal rotation    Hip external rotation    Knee flexion    Knee extension    Ankle dorsiflexion    Ankle plantarflexion    Ankle inversion    Ankle eversion     (Blank rows = not tested)  LOWER EXTREMITY MMT:    MMT Right Eval Left Eval  Hip flexion 4+ 4-  Hip extension 4+ 3+  Hip abduction 4+ 3+  Hip adduction 4+ 3+  Hip internal rotation    Hip external rotation    Knee flexion 5 4-  Knee extension 5 4-  Ankle dorsiflexion 5 4  Ankle plantarflexion    Ankle inversion    Ankle eversion    (Blank rows = not tested)  BED MOBILITY:  Findings: Sit to supine Mod A and Max A Supine to sit Mod A and Max A  TRANSFERS: Sit to stand: Min A and Mod A  Assistive device utilized: Environmental consultant - 2 wheeled     Stand to sit: Min A  Assistive device utilized: Environmental consultant - 2 wheeled     Chair to chair: Min A and Mod A  Assistive device utilized: Environmental consultant - 2 wheeled      PT required to stabilize the LUE on RW as well as provide max cues for attention to hand to prevent sliding off hand grip.  At home Pt utilizes hand splint for stability on RW to prevent loss of grip in transfers.   RAMP:  Not tested  CURB:  Not tested  STAIRS: Not tested GAIT: Findings: Gait Characteristics: step to pattern, decreased hip/knee flexion- Left, Left foot flat, ataxic, lateral  lean- Left, wide BOS, and poor foot clearance- Left, Distance walked: 51ft, Assistive device utilized:Walker - 2 wheeled, Level of assistance: Mod A and Max A, and Comments: poor awareness of lateral/anterior L LOB as well as poor awareness of LUE causing  loos of grip on RW and heavy lateral LOB to the L, +2 assist required to prevent fall and position chair under patient as he attempted turn at 51ft.    FUNCTIONAL TESTS:  5 times sit to stand: 1:29 min Timed up and go (TUG): unable to complete turn at 2ft.  10 meter walk test: unable to complete on this day.   PATIENT SURVEYS:  Stroke Impact Scale 49/80                                                                                                                              TREATMENT DATE: 10/22/2023  *** Therapy session focused on re-assessment of standardized outcome measures and subjective questionnaire to determine pt's progress with therapy thus far.  Berg     ***   Pt arrives to session in transport chair.   Unless otherwise stated, min A was provided and gait belt donned in order to ensure pt safety throughout session.  Swedish knee cage donned for session. Patient states he would be willing to wear his Swedish knee cage at home when he is walking, but states just for transfers it takes him longer to don/doff the knee cage than it does to perform the transfer itself.   ***   Stroke Impact Scale 16 (Copyrighted instrument, [facility])  In the past 2 weeks, how difficult was it to...  Rating Scale 5 = Not difficult at all 4 = A little difficult 3 = Somewhat difficult 2 = Very difficult 1 = Could not do at all  a. Dress the top part of your body? {Numbers; 1-5:17750}  b. Bathe yourself? {Numbers; 1-5:17750}  c. Get to the toilet on time? {Numbers; 1-5:17750}  d. Control your bladder (not have an accident)? {Numbers; 1-5:17750}  e. Control your bowels (not have an accident)? {Numbers; 1-5:17750}  f. Stand without losing balance? {Numbers; 1-5:17750}  g. Go shopping? {Numbers; 1-5:17750}  h. Do heavy household chores (e.g. vacuum, laundry or  yard work)? {Numbers; 1-5:17750}  i. Stay sitting without losing your balance? {Numbers;  1-5:17750}  j. Walk without losing your balance? {Numbers; 1-5:17750}  k. Move from a bed to a chair? {Numbers; 1-5:17750}  l. Walk fast? {Numbers; 1-5:17750}  m. Climb one flight of stairs? {Numbers; 1-5:17750}  n. Walk one block? {Numbers; 1-5:17750}  o. Get in and out of a car? {Numbers; 1-5:17750}  p. Carry heavy objects (e.g. bag of groceries) with your  affected hand? {Numbers; 1-5:17750}  Sum:  ***/80  MDC (Minimal Detectable Change) is >/=8   Patient reports decreased independence and overall increased difficulty performing ADLs and functional mobility since CVA as noted on SIS.  ***  Participated in Timed Up and Go (TUG): 1st trial: 1 minute, 27 seconds 2nd trial: 1 minute, 33 seconds  Average: 1 min, 30 seconds using bari-RW with L hand splint and *** assist Continued to allow pt to set-up L hand on RW, scoot hips forward in seat, and assist with L foot placement prior to starting the time *** Noticed pt with posterior LOB when stepping backwards to seat Continues to have greater instability with turning Patient demonstrates high fall risk as indicated by requiring >13.5seconds to complete the TUG.    10 Meter Walk Test: Patient instructed to walk 10 meters (32.8 ft) as quickly and as safely as possible at their normal speed x2 and at a fast speed x2. Time measured from 2 meter mark to 8 meter mark to accommodate ramp-up and ramp-down.  Normal speed 1: 0.19 m/s (52.11 seconds) Normal speed 2: 0.185 m/s (53.92 seconds) Average Normal speed: 0.1875 m/s using bari-RW with L hand splint, L swedish knee cage, and only skilled light min A of 1 (no wheelchair follow) Pt able to turn around at end of 1st distance to complete 2nd trial without seated rest break! Cut off scores: <0.4 m/s = household Ambulator, 0.4-0.8 m/s = limited community Ambulator, >0.8 m/s = community Ambulator, >1.2 m/s = crossing a street, <1.0 = increased fall risk MCID 0.05 m/s (small), 0.13 m/s (moderate),  0.06 m/s (significant)  (ANPTA Core Set of Outcome Measures for Adults with Neurologic Conditions, 2018)   Five times Sit to Stand Test (FTSS) "Stand up and sit down as quickly as possible 5 times, keeping your arms folded across your chest."    TIME: 36.37 seconds using R UE support to push-up from armrest of green chair and L hand on RW hand splint *** - with only CGA TIME: 27.10 seconds using R UE support to push-up from armrest of green chair and not using L UE support with only CGA for steadying balance   Times > 13.6 seconds is associated with increased disability and morbidity (Guralnik, 2000) Times > 15 seconds is predictive of recurrent falls in healthy individuals aged 75 and older (Buatois, et al., 2008) Normal performance values in community dwelling individuals aged 68 and older (Bohannon, 2006): 60-69 years: 11.4 seconds 70-79 years: 12.6 seconds 80-89 years: 14.8 seconds  MCID: >= 2.3 seconds for Vestibular Disorders Ary, 2006)  ***   Patient participated in [place] Balance Test and demonstrates increased fall risk as noted by score of   ***/56.  (<36= high risk for falls, close to 100%; 37-45 significant >80%; 46-51 moderate >50%; 52-55 lower >25%). ***To be completed at next visit .  [facility] PT Assessment - 10/22/23 0001       Berg Balance Test   Sit to Stand Needs minimal aid to stand or to stabilize    Standing Unsupported Able to stand 2 minutes with supervision   L LE still slightly abducted for wider BOS, but improving and more WBing through L LE   Sitting with Back Unsupported but Feet Supported on Floor or Stool Able to sit safely and securely 2 minutes    Stand to Sit Uses backs of legs against chair to control descent    Transfers Needs one person to assist    Standing Unsupported with Eyes Closed Able to stand 10 seconds with supervision          ***  Improved sequencing and motor planning with transfers on this day. But requires moderate cues for  safety  for turns to the R due to continued mild LLE inattention.   ***  PATIENT EDUCATION: Education details: Pt educated throughout session about proper posture and technique with exercises. Improved exercise technique, movement at target joints, use of target muscles after min to mod verbal, visual, tactile cues.  Person educated: Patient and Spouse Education method: Explanation, VC/TC/Demo Education comprehension: verbalized understanding  HOME EXERCISE PROGRAM:  Access Code: [email or web address] URL: [email or web address] Date: 09/11/2023 Prepared by: Massie Dollar  Exercises - Seated Knee Extension AROM  - 1 x daily - 4 x weekly - 3 sets - 10 reps - Seated Hip Abduction  - 1 x daily - 4 x weekly - 3 sets - 10 reps - Seated March  - 1 x daily - 4 x weekly - 3 sets - 10 reps - Sit to Stand with Counter Support  - 1 x daily - 4 x weekly - 3 sets - 10 reps - Supine Short Arc Quad  - 1 x daily - 4 x weekly - 3 sets - 10 reps - Supine Hip Abduction  - 1 x daily - 4 x weekly - 3 sets - 10 reps - Supine Bridge  - 1 x daily - 4 x weekly - 3 sets - 3 reps - 2sec  hold  GOALS: Goals reviewed with patient? Yes  SHORT TERM GOALS: Target date: 09/25/2023    Patient will be independent in home exercise program to improve strength/mobility for better functional independence with ADLs. Baseline: Initiated on 09/11/2023 10/22/2023: *** Goal status: IN PROGRESS   LONG TERM GOALS: Target date: 11/13/2023    Patient will increase SIS-16 score to equal to or greater than 10points to demonstrate statistically significant improvement in mobility and quality of life.  Baseline: 49/80 09/19/2023: 45/80 10/22/2023: *** Goal status: IN PROGRESS  2.  Patient (> 58 years old) will complete five times sit to stand test in < 15 seconds indicating an increased LE strength and improved balance. Baseline: 1:75min 09/19/2023: 56.39 seconds using R UE support to push-up from armrest of green chair and  requiring skilled min A for balance and lifting/lowering  10/22/2023: *** Goal status: IN PROGRESS  3.  Patient will increase FIST score by > 6 points to demonstrate decreased fall risk during functional activities Baseline:  43/56  09/23/2023: 53/56 Goal status: MET and UPGRADED  09/23/2023: Patient will increase Berg Balance score to > 45/56 to demonstrate improved balance and decreased fall risk during functional activities and ADLs.  Baseline: 09/23/2023: 14/56 10/22/2023: *** Goal status: IN PROGRESS  4.  Patient will increase 10 meter walk test to >0.45m/s as to improve gait speed for better community ambulation and to reduce fall risk. Baseline: 09/09/2023: 0.103 m/s ( and 37 seconds) using bari-RW with skilled heavy min A and +2 w/c follow for safety  09/19/2023: 0.59m/s using bari-RW with skilled min A of 1 and +2 w/c follow for safety 10/22/2023: *** Goal status: IN PROGRESS  5.  Patient will reduce timed up and go to <11 seconds to reduce fall risk and demonstrate improved transfer/gait ability. Baseline: unable to complete turn at 10 ft. Mod-max assist due to poor control of LW on RW, resulting in L lateral LOB  09/09/2023: and 32 seconds using bari-RW with L hand splint and +2 on R side for safety, therapist providing skilled min A on L side (Had pt set-up L hand on RW splint, get L foot in proper positioning, and scoot forward in seat before starting the  timer) 09/19/2023: 69min43 seconds using bari-RW with only skilled min A of 1 (Had pt set-up L hand on RW splint, get L foot in proper positioning, and scoot forward in seat before starting the timer) 7/2: 78sec(1:18.125) with RW and min assist overall.  10/22/2023: *** Goal status: IN PROGRESS   ASSESSMENT:  CLINICAL IMPRESSION:  *** Patient is a 73 y.o. male who was seen today for physical therapy treatment for balance, coordination, and gait deficits following CVA in April. Therapy session continued to focus on improved  midline orientation, improved dynamic standing balance, and improved L LE coordination. Was noted to have increased edema on the LLE on this day, causing pitting from knee cage after ~ 25 minutes of wear. Patient continues to demonstrate L knee hyperextension during standing and transfers without Swedish knee cage; despite pitting edema with knee cage; continued to recommended patient wearing this during those activities, even at home.  Focused on simulated home ambulation on this day to navigate tight spaces with 180deg turns and side stepping to simulate movement through bathroom and along the EOB. Tolerated well with only min cues and assist for safety of AD placement and attention to the LLE with fatigue.  Pt will continue to benefit from skilled physical therapy interventions to address above impairments, improve QOL, and attain therapy goals.     OBJECTIVE IMPAIRMENTS: Abnormal gait, cardiopulmonary status limiting activity, decreased activity tolerance, decreased balance, decreased cognition, decreased coordination, decreased endurance, decreased knowledge of condition, decreased knowledge of use of DME, decreased mobility, difficulty walking, decreased ROM, decreased strength, decreased safety awareness, dizziness, hypomobility, increased fascial restrictions, impaired perceived functional ability, increased muscle spasms, impaired sensation, impaired tone, impaired UE functional use, impaired vision/preception, improper body mechanics, postural dysfunction, and obesity.   ACTIVITY LIMITATIONS: carrying, lifting, bending, sitting, standing, squatting, stairs, transfers, bed mobility, bathing, toileting, dressing, reach over head, hygiene/grooming, locomotion level, and caring for others  PARTICIPATION LIMITATIONS: meal prep, cleaning, laundry, medication management, interpersonal relationship, driving, shopping, community activity, and yard work  PERSONAL FACTORS: Age, Past/current experiences,  Time since onset of injury/illness/exacerbation, and 3+ comorbidities: MG, HTN, CVA, lymphedema are also affecting patient's functional outcome.   REHAB POTENTIAL: Good  CLINICAL DECISION MAKING: Unstable/unpredictable  EVALUATION COMPLEXITY: High  PLAN:  PT FREQUENCY: 1-2x/week  PT DURATION: 12 weeks  PLANNED INTERVENTIONS: 97164- PT Re-evaluation, 97750- Physical Performance Testing, 97110-Therapeutic exercises, 97530- Therapeutic activity, [id number]- Neuromuscular re-education, 97535- Self Care, [id number]- Manual therapy, [id number]- Gait training, [phone]- Orthotic Initial, [phone]- Orthotic/Prosthetic subsequent, [phone]- Canalith repositioning, [id number]- Electrical stimulation (unattended), [phone]- Electrical stimulation (manual), 97597- Wound care (first 20 sq cm), 97598- Wound care (each additional 20 sq cm), Patient/Family education, Balance training, Stair training, Taping, Dry Needling, Joint mobilization, Joint manipulation, Vestibular training, Visual/preceptual remediation/compensation, Cognitive remediation, DME instructions, Wheelchair mobility training, Cryotherapy, and Moist heat  PLAN FOR NEXT SESSION:  *[employer] Balance Test* Gait training with L hand splint on bari-RW  Continue progression of turning through doorways and turning to sit in chair at end of gait (bias having chair on his L for increased challenge) as well as navigating around obstacles Continue reinforcing pt management of L hand placement on/off hand splint during transfers to hopefully reach mod-I stand pivot transfer level at home prior to reaching ambulatory level Cue pt to pull AD towards him with L hand when transitioning sit>stand Reinforce education on wearing Swedish knee cage at home Family education for gait in simulated home environment.    Connell Kiss, PT, DPT, NCS, CSRS Physical  Therapist - [place]  [facility]  11:02 AM 10/22/23

## 2023-10-22 NOTE — Therapy (Addendum)
 OCCUPATIONAL THERAPY NEURO TREATMENT NOTE  Patient Name: [name]. MRN: [id number] DOB:01/21/1951, 73 y.o., male Today's Date: 10/22/2023  PCP: Alm Rower, MD REFERRING PROVIDER: Hermann Toribio PARAS, PA-C  END OF SESSION:  OT End of Session - 10/22/23 1217     Visit Number 18    Number of Visits 24    Date for OT Re-Evaluation 11/12/23    OT Start Time 0845    OT Stop Time 0930    OT Time Calculation (min) 45 min    Activity Tolerance Patient tolerated treatment well    Behavior During Therapy [facility] for tasks assessed/performed            Past Medical History:  Diagnosis Date   Anemia    Cervical myelopathy (HCC)    Chronic venous insufficiency    [name] (chronic kidney disease), stage III (HCC)    Diabetes mellitus without complication (HCC)    GERD (gastroesophageal reflux disease)    Hypercholesteremia    Hypothyroidism    Kidney stones    about 50 in the past--last in March 4/24   Leg weakness, bilateral    due to Myasthenia Gravis   Myasthenia gravis (HCC)    Spinal stenosis    Vitamin B 12 deficiency    Vitamin B 12 deficiency    Wears hearing aid    bilateral   Past Surgical History:  Procedure Laterality Date   BACK SURGERY  09/2015   CARDIAC CATHETERIZATION     CATARACT EXTRACTION W/ INTRAOCULAR LENS IMPLANT     CATARACT EXTRACTION W/PHACO Right 12/21/2015   Procedure: CATARACT EXTRACTION PHACO AND INTRAOCULAR LENS PLACEMENT (IOC);  Surgeon: Dene Etienne, MD;  Location: [facility];  Service: Ophthalmology;  Laterality: Right;  DIABETIC - insulin  and oral meds   COLONOSCOPY     SHOULDER SURGERY     labrum repair   TONSILLECTOMY     Patient Active Problem List   Diagnosis Date Noted   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) 10/11/2023   High blood pressure 08/13/2023   Anemia 08/13/2023   Constipation 08/13/2023   Myasthenia gravis (HCC) 08/13/2023   Acute ischemic right MCA  stroke (HCC) 07/19/2023   Lymphedema 12/27/2016   Leg pain 07/15/2016   Chronic venous insufficiency 07/15/2016   Swelling of limb 07/15/2016   Type 2 diabetes mellitus (HCC) 07/15/2016   Hyperlipidemia 07/15/2016   ONSET DATE: 07/13/2023  REFERRING DIAG: Acute ischemic R MCA CVA  THERAPY DIAG: muscle weakness (generalized), other lack of coordination, vision disturbance, hemiplegia and hemiparesis following cerebral infarction affecting left non-dominant side (HCC)  Rationale for Evaluation and Treatment: Rehabilitation  SUBJECTIVE:  SUBJECTIVE STATEMENT: Pt/caregiver reports attempting to use Saebo E-Stim unit to perform wrist extension, however, Pt. Reports being unable to feel the electrical stimulation. Pt accompanied by: significant other  PERTINENT HISTORY: Pt. Was admitted to [facility] on 07/13/2023 after sustaining a CVA while on a trip to the beach. Pt. Was diagnosed with R MCA CVA. Pt. Was admitted to inpatient rehab from 07/19/2023-08/13/2023. Past medical history includes high BP, anemia, and Myasthenia Gravis.   PRECAUTIONS: None  WEIGHT BEARING RESTRICTIONS: No  PAIN:   10/22/2023: No pain. 10/14/2023: No pain 10/07/23: 4/10 pain L shoulder Are you having pain?  10/01/2023- Yes, 7/10 in L shoulder due to most recent fall that occurred this past Friday. 5/10 L wrist  FALLS: Has patient fallen in last 6 months? Yes  LIVING ENVIRONMENT: Lives with:  lives with their family and lives with their spouse-  Lives in: House/apartment- 2 story home and resides mostly on the 1st floor Stairs: No- Ramped entrance Has following equipment at home:  Vannie, cane, Steadi, shower chair, handheld shower head, bedside commode  PLOF: Independent and Independent with basic ADLs Independent at home   PATIENT GOALS: Would like to walk 10-20 feet each time.   OBJECTIVE:  Note: Objective measures were completed at Evaluation unless otherwise noted.  HAND DOMINANCE: Right  ADLs: Overall  ADLs:  Transfers/ambulation related to ADLs: Eating: Independent, 100% R handed Grooming: independent UB Dressing: Can put on shirt independently LB Dressing: Difficulty with pants and requires assistance getting the pants on his feet, has difficulty putting on shoes and socks Toileting: Tot A for toilet hygiene. Uses Steadi during toilet transfers. Bathing: Requires assist with using the L arm to wash the R arm. Tub Shower transfers: Roll in shower, built in shower chair, grab bars and hand held shower head.  IADLs: Shopping: Total A (Wife typically did the shopping prior to onset)   Light housekeeping: Total A (wife typically performs prior to onset) Meal Prep: Independent with light snack prep Community mobility: No driving, able to get in/out of the car Medication management: Set up assistance from his wife using a pill box (pt. Is responsible for taking medications at the correct time) Financial management: family members check behind him. Handwriting: TBD Hobbies: gardening, wood working and sports- [employer] Work history: Owns a tax business MOBILITY STATUS: Needs Assist:    POSTURE COMMENTS:   Sitting balance: Good supported sitting balance  FUNCTIONAL OUTCOME MEASURES: TBD   UPPER EXTREMITY ROM:    Active ROM Right Eval  [facility] Right 09/23/2023 [facility] Left eval Left 09/23/2023  Shoulder flexion   89(110) 109(114)  Shoulder abduction   98(120) 109(120)  Shoulder adduction      Shoulder extension      Shoulder internal rotation      Shoulder external rotation      Elbow flexion   120(140) 130(136)  Elbow extension   -28(-10) -5(0)  Wrist flexion   60(60) Rest at 60 50(65)  Wrist extension   -48(42) 10(40)  Wrist ulnar deviation      Wrist radial deviation      Wrist pronation      Wrist supination      (Blank rows = not tested)  Eval: L Digit flexion: 2nd:3cm (0cm) 3rd: 0cm (0cm), 4th: 0cm (0cm), 5th: 2cm (0cm)  09/23/23:  L Digit flexion to [facility]: 2nd: 3.5cm  (0cm), 3rd: 4 cm(0cm), 4th 3.5cm (0cm), 5th: 2cm (0cm)  Eval:  L Digit extension: Active Gross digit extension through 10% of ROM  09/23/23:  L Digit extension: Active Gross digit extension through 50% of ROM   UPPER EXTREMITY MMT:     MMT Right eval Left eval Left 09/23/2023  Shoulder flexion 5 3- 3-/5  Shoulder abduction 5 3- 3-/5  Shoulder adduction     Shoulder extension     Shoulder internal rotation     Shoulder external rotation     Middle trapezius     Lower trapezius     Elbow flexion 5 TBD 3+/5  Elbow extension 5 TBD 4-/5  Wrist flexion   2/5  Wrist extension  2- 2/5  Wrist ulnar deviation     Wrist radial deviation     Wrist pronation     Wrist supination     (Blank rows = not tested)  HAND  FUNCTION:   Grip strength:   Right: 94#, Left: 1#,   Lateral Key Pinch strength:   Right: 14#, Left: 3#  COORDINATION:  Box and Blocks: Right: 34 blocks completed in 1 min., Left: 0 blocks completed in 1 min.   SENSATION: Sensation feels different in both hands.  L hand Light touch- impaired L hand Proprioception- impaired  EDEMA: Has had hx of edema, and came to Eval session with swelling in the L hand/wrist/forearm.   *pt. Has a laceration from his dog on the dorsal aspect of the L hand.   MUSCLE TONE: Increased tone through the UE and hand flexors.   COGNITION: Overall cognitive status: Within functional limits for tasks assessed  VISION: Subjective report: L sided inattention Baseline vision: Wears glasses for reading only Visual history: Right visual occlusion  VISION ASSESSMENT: To be assessed  PERCEPTION: TBD  PRAXIS: Impaired                                                                                                                 TREATMENT DATE: 10/22/2023  -Assessed Saebo E-Stim device fit per Pt/caregiver request for L AAROM/PROM wrist, and digit extension as they report having difficulty getting a response.   Manual Therapy:   -Pt. Tolerated retrograde massage to the Left hand for edema control 2/2 increased edema from the distal tips of the digits through the hand and into the wrist, and forearm.  Therapeutic Ex -LUE AAROM/AROM/PROM was performed with slow prolonged gentle stretching wrist flexion/extension, and digit extension/flexion.        PATIENT EDUCATION: Education details: Saebo E-Stim unit Person educated: Patient and spouse Education method: Explanation and Verbal cues Education comprehension: verbalized understanding and needs further education  HOME EXERCISE PROGRAM:  -grasping/releasing tennis ball sized circular sphere, holding while performing wrist extension -Repetitions for grasping/releasing 1 blocks.  GOALS: Goals reviewed with patient? Yes  SHORT TERM GOALS: Target date: 10/01/2023   Pt. Will be independent with HEP for the LUE.  Baseline: 09/19/23: Requires assist from his wife Eval; no current HEP Goal status: Ongoing   LONG TERM GOALS: Target date: 11/12/2023  Pt. Will increase L shoulder flexion by 10 degrees to be able to reach up to shelves.  Baseline: 09/19/23: Pt. Continues to be limited with reaching up to shelves. Eval: L shoulder flexion is 89 (110). Goal status: Ongoing  2.  Pt. Will increase L shoulder abduction by 10 degrees to assist with underarm hygiene.  Baseline: 09/19/23: Pt. Continues to require assist with underarm hygiene.Eval: L shoulder abduction is 98 (120).  Goal status: Ongoing  3.  Pt. Will improve L wrist extension by 10 degrees to be able to initiate anticipation of grasping for objects.  Baseline: 09/19/23: Consistent activation noted in the left wrist extensors with facilitation. Eval: -48 (42) Goal status: Ongoing  4.  Pt. Will improve active gross digit extension through 50% of the range to be able to release objects in his hand consistently.  Baseline: 09/19/23: gross digit extension noted through 25%  range with facilitation.Eval: gross  digit extension is 10% of the range  Goal status: Goal updated to 50% of the range  5.  Pt. Will independently demonstrate visual compensatory strategies when navigating through environment and during tabletop tasks. Baseline: 09/19/23: Pt. Requires fewer cues for left sided awareness Eval: Pt. Requires consistent cuing for L sided awareness.  Goal status: Ongoing  ASSESSMENT:  CLINICAL IMPRESSION: Pt. and spouse arrived to treatment session today and brought Saebo E-stim device as well as instructions for use of device. Pt. reports no pain during treatment session today. Pt/caregiver education was provided about the electrode placement of the Saebo EStim for Left wrist, and digit extension. The Saebo unit was charged overnight, in preparation for use during treatment session today. Pt./Caregiver reports that when attempting Saebo E-Stim unit at home, Pt. Is unable to feel the electrical stimulation, and has no muscle response for the device at home, and minimal active responses when assessed in the clinic. Pt. Was able to achieve more wrist extension actively without the device. Pt. reports that he was unable to feel any response at the electrode placement site on the extensors of the L forearm due to decreased sensation awareness. Pt. and spouse are considering on returning Saebo E-Stim unit due to inconsistent responses elicited from the unit, and due to Pt presenting with increased Left active wrist extension without use of E-stim, combined with inconsistency of electrical stimulation from the Saebo E-stim unit when placed on the wrist/digit extensors. Pt. Reports that he has difficulty at home with grasping larger items, like soda cans, however, is able to hold items against the body to compensate. Pt continues to benefit from OT services to work on improving LUE functioning to increase engagement of the LUE during daily task, and to provide education about compensatory strategies during  ADLs/IADLs.  PERFORMANCE DEFICITS: in functional skills including ADLs, IADLs, coordination, dexterity, proprioception, sensation, edema, tone, ROM, strength, pain, Fine motor control, Gross motor control, mobility, balance, endurance, vision, and UE functional use, cognitive skills including perception, and psychosocial skills including coping strategies, environmental adaptation, and routines and behaviors.   IMPAIRMENTS: are limiting patient from ADLs, IADLs, and leisure.   CO-MORBIDITIES: may have co-morbidities  that affects occupational performance. Patient will benefit from skilled OT to address above impairments and improve overall function.  MODIFICATION OR ASSISTANCE TO COMPLETE EVALUATION: Min-Moderate modification of tasks or assist with assess necessary to complete an evaluation.  OT OCCUPATIONAL PROFILE AND HISTORY: Detailed assessment: Review of records and additional review of physical, cognitive, psychosocial history related to current functional performance.  CLINICAL DECISION MAKING: Moderate - several treatment options, min-mod task modification necessary  REHAB POTENTIAL: Good  EVALUATION COMPLEXITY: Moderate    PLAN:  OT FREQUENCY: 2x/week  OT DURATION: 12 weeks  PLANNED INTERVENTIONS: 97168 OT Re-evaluation, 97535 self care/ADL training, 02889 therapeutic exercise, 97530 therapeutic activity, 97112 neuromuscular re-education, 97140 manual therapy, 97018 paraffin, 02989 moist heat, 97010 cryotherapy, 97034 contrast bath, 97760 Orthotic Initial, 97763 Orthotic/Prosthetic subsequent, passive range of motion, visual/perceptual remediation/compensation, energy conservation, and DME and/or AE instructions  RECOMMENDED OTHER SERVICES: PT and ST  CONSULTED AND AGREED WITH PLAN OF CARE: Patient and family member/caregiver  PLAN FOR NEXT SESSION: see above  Damien Nap, OTS   This entire session was performed under direct supervision and direction of a licensed  therapist/therapist assistant . I have personally read, edited and approve of the note as written.  Richardson Otter, MS, OTR/L   10/22/2023

## 2023-10-24 ENCOUNTER — Ambulatory Visit: Admitting: Physical Therapy

## 2023-10-24 ENCOUNTER — Ambulatory Visit: Admitting: Speech Pathology

## 2023-10-24 ENCOUNTER — Ambulatory Visit: Admitting: Occupational Therapy

## 2023-10-24 DIAGNOSIS — R41841 Cognitive communication deficit: Secondary | ICD-10-CM | POA: Diagnosis not present

## 2023-10-24 DIAGNOSIS — R2681 Unsteadiness on feet: Secondary | ICD-10-CM

## 2023-10-24 DIAGNOSIS — M6281 Muscle weakness (generalized): Secondary | ICD-10-CM

## 2023-10-24 DIAGNOSIS — R262 Difficulty in walking, not elsewhere classified: Secondary | ICD-10-CM

## 2023-10-24 DIAGNOSIS — I69354 Hemiplegia and hemiparesis following cerebral infarction affecting left non-dominant side: Secondary | ICD-10-CM

## 2023-10-24 DIAGNOSIS — R269 Unspecified abnormalities of gait and mobility: Secondary | ICD-10-CM

## 2023-10-24 DIAGNOSIS — R278 Other lack of coordination: Secondary | ICD-10-CM

## 2023-10-24 NOTE — Therapy (Signed)
 OUTPATIENT SPEECH LANGUAGE PATHOLOGY  COGNITION TREATMENT NOTE   Patient Name: [name]. MRN: [id number] DOB:1950/07/15, 73 y.o., male Today's Date: 10/24/2023  PCP: Rolin Stank, MD REFERRING PROVIDER: Toribio pitch, PA-C    End of Session - 10/24/23 1444     Visit Number 16    Number of Visits 25    Date for SLP Re-Evaluation 11/12/23    Authorization Type [employer]    Authorization Time Period 10/22/2023 thru 10/30/2023    Authorization - Visit Number 2    Authorization - Number of Visits 8    Progress Note Due on Visit 20    SLP Start Time 1445    SLP Stop Time  1530    SLP Time Calculation (min) 45 min    Activity Tolerance Patient tolerated treatment well           Past Medical History:  Diagnosis Date   Anemia    Cervical myelopathy (HCC)    Chronic venous insufficiency    [name] (chronic kidney disease), stage III (HCC)    Diabetes mellitus without complication (HCC)    GERD (gastroesophageal reflux disease)    Hypercholesteremia    Hypothyroidism    Kidney stones    about 50 in the past--last in March 4/24   Leg weakness, bilateral    due to Myasthenia Gravis   Myasthenia gravis (HCC)    Spinal stenosis    Vitamin B 12 deficiency    Vitamin B 12 deficiency    Wears hearing aid    bilateral   Past Surgical History:  Procedure Laterality Date   BACK SURGERY  09/2015   CARDIAC CATHETERIZATION     CATARACT EXTRACTION W/ INTRAOCULAR LENS IMPLANT     CATARACT EXTRACTION W/PHACO Right 12/21/2015   Procedure: CATARACT EXTRACTION PHACO AND INTRAOCULAR LENS PLACEMENT (IOC);  Surgeon: Dene Etienne, MD;  Location: [facility];  Service: Ophthalmology;  Laterality: Right;  DIABETIC - insulin  and oral meds   COLONOSCOPY     SHOULDER SURGERY     labrum repair   TONSILLECTOMY     Patient Active Problem List   Diagnosis Date Noted   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)  10/11/2023   High blood pressure 08/13/2023   Anemia 08/13/2023   Constipation 08/13/2023   Myasthenia gravis (HCC) 08/13/2023   Acute ischemic right MCA stroke (HCC) 07/19/2023   Lymphedema 12/27/2016   Leg pain 07/15/2016   Chronic venous insufficiency 07/15/2016   Swelling of limb 07/15/2016   Type 2 diabetes mellitus (HCC) 07/15/2016   Hyperlipidemia 07/15/2016    ONSET DATE: 07/19/2023   REFERRING DIAG: P36.488 (ICD-10-CM) - Cerebral infarction due to unspecified occlusion or stenosis of right middle cerebral artery  THERAPY DIAG:  Cognitive communication deficit  Rationale for Evaluation and Treatment Rehabilitation  SUBJECTIVE:   PERTINENT HISTORY and DIAGNOSTIC FINDINGS:   [name] is a 73 y.o. 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  CT Brain w/o contrast: 07/14/2023 New hypodensity within the right periventricular white matter extending into the inferior frontal lobe/operculum compatible with a recent infarct.   MRI Brain w/o contrast: 07/15/2023 IMPRESSION:  Acute right MCA distribution infarct involving the right periventricular white matter extending into the right frontal and parietal lobes and insula , some of which correlates to the  recent CT finding. No significant associated mass effect or associated hemorrhage.   MRA head angiogram 07/16/2023 IMPRESSION:  Focal occlusion of the right inferior division distal M2 MCA (6:123) in keeping with known right MCA infarct when compared to prior MRI brain 07/15/2023.   CT head 07/17/2023 IMPRESSION:  Expansion of territory involved in the right MCA infarct compared with MRI of 07/15/2023. Increasing local mass effect with new leftward midline shift of 3 mm. No hemorrhage.   Speech Language Evaluation at [facility]  07/19/2023 Patient presents with cognitive deficits in the areas of problem solving and left inattention as evidenced throughout patient/family interview and performance on Cognistat evaluation. Patient further demonstrates impairments in the areas of intellectual, anticipatory, and emergent awareness of deficits/mistakes.   Speech Language Evaluation at [facility] 07/31/2023 Skilled therapy session focused on re-evaluation of cognitive skills utilizing the CLQT standardized assessment. Patient scored with overall mild cognitive deficits in the subtests of attention, executive functioning and visual spatial skills. Patient scored WFL on the clock drawing task, language and memory.   PAIN:  Are you having pain? No   FALLS: Has patient fallen in last 6 months?  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 SLP facilitated session by providing the following interventions:  SLP further facilitated session by providing semi-complex visual scanning tasks.  When asked to circle double numbers on a line and then count how many were present - he completed independently and was 100% accurate   When presented with worksheet asking him to cross out all double numbers in each line pt turned paper vertically to help with left visual inattention but then pt skipped every other row d/t left visual inattention - use of plain paper helpful in pt scanning   Constant Therapy Clinician App  utilized to target higher level attention and memory.  N-Back Level 2 - 85% independently  PATIENT EDUCATION: Education details: see above Person educated: Patient and Spouse Education method: Explanation Education comprehension: needs further education   HOME EXERCISE PROGRAM:   See above for details    GOALS:  Goals reviewed with patient? Yes  SHORT TERM GOALS: Target date: 10 sessions  Updated: 09/25/2023 The patient will complete a simple word search puzzle (66 or smaller) within 10 minutes given intermittent moderate verbal cues. Baseline: Goal status: INITIAL: MET  2.  The patient will recall sentence-level information after a 5-minute delay using the spaced retrieval technique. Baseline:  Goal status: INITIAL: MET  3.  With moderate cues, pt will identify 3 cognitive strengths/weaknesses during tasks.  Baseline:  Goal status: INITIAL: MET  4. Pt will complete semi-complex reasoning task with supervision A to achieve > 90% accuracy.  Baseline:   Goal status: INITIAL  5. Pt will recall 2-3 compensatory memory strategies that he has implemented during functional activity over 3 out of 5 session. Baseline: Goal Status: INITIAL.     LONG TERM GOALS: Target date: 11/12/2023  Updated: 09/25/2023 The patient will read sentence-level information aloud at 80% accuracy given intermittent minimal verbal cues in order to  increase ability to safely live independently. Baseline:  Goal status: INITIAL: MET  2.   With Min A, patient will use external aid for functional recall of completed/upcoming activities 75% accuracy.     Baseline:  Goal status: INITIAL: MET  3.   With Min A, patient will complete semi-complex visual memory tasks with 75% accuracy independently.   Baseline:  Goal status: INITIAL: MET  4.  With Supervision A, patient will use strategies (ie., white board, daily planner/calendar, Apps on phone) to improve memory for important information with 80%  accuracy.  Baseline: Goal status: INITIAL    ASSESSMENT:  CLINICAL IMPRESSION: Patient is a 73 y.o. right handed male who was seen today for a cognitive communication treatment d/t right MCA CVA. Pt presents with overall  moderate cognitive impairment with severe deficits in delayed memory, severe deficits in visuospatial/constructional abilities (apart from left inattention), decreased awareness of deficits/errors and mild deficits in higher level attention. Immediate memory, language and selective attention are relative strengths for pt.   Pt continues with improved abilities within ST sessions as well as report of improved ability within functional tasks at home.  See the above treatment note for details.   OBJECTIVE IMPAIRMENTS include attention, memory, awareness, and executive functioning. These impairments are limiting patient from return to work, managing medications, managing appointments, managing finances, household responsibilities, and ADLs/IADLs. Factors affecting potential to achieve goals and functional outcome are severity of impairments. Patient will benefit from skilled SLP services to address above impairments and improve overall function.  REHAB POTENTIAL: Good  PLAN: SLP FREQUENCY: 1-2x/week  SLP DURATION: 12 weeks  PLANNED INTERVENTIONS: Cognitive reorganization, Internal/external aids, Functional tasks, SLP instruction and feedback, Compensatory strategies, and Patient/family education   Juan Kissoon B. Rubbie, M.S., CCC-SLP, Tree surgeon Certified Brain Injury Specialist [facility]  [facility] Rehabilitation Services Office [phone] Ascom [phone] Fax [phone]

## 2023-10-24 NOTE — Therapy (Addendum)
 OCCUPATIONAL THERAPY NEURO TREATMENT NOTE  Patient Name: [name]. MRN: [id number] DOB:[birth year], 73 y.o., male Today's Date: 10/24/2023  PCP: Alm Rower, MD REFERRING PROVIDER: Hermann Toribio PARAS, PA-C  END OF SESSION:  OT End of Session - 10/24/23 1745     Visit Number 19    Number of Visits 24    Date for OT Re-Evaluation 11/12/23    OT Start Time 1615    OT Stop Time 1700    OT Time Calculation (min) 45 min    Activity Tolerance Patient tolerated treatment well    Behavior During Therapy WFL for tasks assessed/performed             Past Medical History:  Diagnosis Date   Anemia    Cervical myelopathy (HCC)    Chronic venous insufficiency    [name] (chronic kidney disease), stage III (HCC)    Diabetes mellitus without complication (HCC)    GERD (gastroesophageal reflux disease)    Hypercholesteremia    Hypothyroidism    Kidney stones    about 50 in the past--last in March 4/24   Leg weakness, bilateral    due to Myasthenia Gravis   Myasthenia gravis (HCC)    Spinal stenosis    Vitamin B 12 deficiency    Vitamin B 12 deficiency    Wears hearing aid    bilateral   Past Surgical History:  Procedure Laterality Date   BACK SURGERY  09/2015   CARDIAC CATHETERIZATION     CATARACT EXTRACTION W/ INTRAOCULAR LENS IMPLANT     CATARACT EXTRACTION W/PHACO Right 12/21/2015   Procedure: CATARACT EXTRACTION PHACO AND INTRAOCULAR LENS PLACEMENT (IOC);  Surgeon: Dene Etienne, MD;  Location: [facility];  Service: Ophthalmology;  Laterality: Right;  DIABETIC - insulin  and oral meds   COLONOSCOPY     SHOULDER SURGERY     labrum repair   TONSILLECTOMY     Patient Active Problem List   Diagnosis Date Noted   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) 10/11/2023   High blood pressure 08/13/2023   Anemia 08/13/2023   Constipation 08/13/2023   Myasthenia gravis (HCC) 08/13/2023   Acute ischemic right MCA  stroke (HCC) 07/19/2023   Lymphedema 12/27/2016   Leg pain 07/15/2016   Chronic venous insufficiency 07/15/2016   Swelling of limb 07/15/2016   Type 2 diabetes mellitus (HCC) 07/15/2016   Hyperlipidemia 07/15/2016   ONSET DATE: 07/13/2023  REFERRING DIAG: Acute ischemic R MCA CVA  THERAPY DIAG: muscle weakness (generalized), other lack of coordination, vision disturbance, hemiplegia and hemiparesis following cerebral infarction affecting left non-dominant side (HCC)  Rationale for Evaluation and Treatment: Rehabilitation  SUBJECTIVE:  SUBJECTIVE STATEMENT: Pt Reports that he continues to have L dorsal forearm pain at home when using walker splint.  Pt accompanied by: significant other  PERTINENT HISTORY: Pt. Was admitted to [facility] on 07/13/2023 after sustaining a CVA while on a trip to the beach. Pt. Was diagnosed with R MCA CVA. Pt. Was admitted to inpatient rehab from 07/19/2023-08/13/2023. Past medical history includes high BP, anemia, and Myasthenia Gravis.   PRECAUTIONS: None  WEIGHT BEARING RESTRICTIONS: No  PAIN:  10/24/2023: No pain during treatment session, pt. Reported having pain in L dorsal forearm when using walker splint at home.  10/22/2023: No pain. 10/14/2023: No pain 10/07/23: 4/10 pain L shoulder Are you having pain?  10/01/2023- Yes, 7/10 in L shoulder due to most recent fall that occurred this past Friday. 5/10  L wrist  FALLS: Has patient fallen in last 6 months? Yes  LIVING ENVIRONMENT: Lives with: lives with their family and lives with their spouse-  Lives in: House/apartment- 2 story home and resides mostly on the 1st floor Stairs: No- Ramped entrance Has following equipment at home:  Vannie, cane, Steadi, shower chair, handheld shower head, bedside commode  PLOF: Independent and Independent with basic ADLs Independent at home   PATIENT GOALS: Would like to walk 10-20 feet each time.   OBJECTIVE:  Note: Objective measures were completed at Evaluation  unless otherwise noted.  HAND DOMINANCE: Right  ADLs: Overall ADLs:  Transfers/ambulation related to ADLs: Eating: Independent, 100% R handed Grooming: independent UB Dressing: Can put on shirt independently LB Dressing: Difficulty with pants and requires assistance getting the pants on his feet, has difficulty putting on shoes and socks Toileting: Tot A for toilet hygiene. Uses Steadi during toilet transfers. Bathing: Requires assist with using the L arm to wash the R arm. Tub Shower transfers: Roll in shower, built in shower chair, grab bars and hand held shower head.  IADLs: Shopping: Total A (Wife typically did the shopping prior to onset)   Light housekeeping: Total A (wife typically performs prior to onset) Meal Prep: Independent with light snack prep Community mobility: No driving, able to get in/out of the car Medication management: Set up assistance from his wife using a pill box (pt. Is responsible for taking medications at the correct time) Financial management: family members check behind him. Handwriting: TBD Hobbies: gardening, wood working and sports- [employer] Work history: Owns a tax business MOBILITY STATUS: Needs Assist:    POSTURE COMMENTS:   Sitting balance: Good supported sitting balance  FUNCTIONAL OUTCOME MEASURES: TBD   UPPER EXTREMITY ROM:    Active ROM Right Eval  [facility] Right 09/23/2023 [facility] Left eval Left 09/23/2023  Shoulder flexion   89(110) 109(114)  Shoulder abduction   98(120) 109(120)  Shoulder adduction      Shoulder extension      Shoulder internal rotation      Shoulder external rotation      Elbow flexion   120(140) 130(136)  Elbow extension   -28(-10) -5(0)  Wrist flexion   60(60) Rest at 60 50(65)  Wrist extension   -48(42) 10(40)  Wrist ulnar deviation      Wrist radial deviation      Wrist pronation      Wrist supination      (Blank rows = not tested)  Eval: L Digit flexion: 2nd:3cm (0cm) 3rd: 0cm (0cm), 4th: 0cm (0cm),  5th: 2cm (0cm)  09/23/23:  L Digit flexion to [facility]: 2nd: 3.5cm (0cm), 3rd: 4 cm(0cm), 4th 3.5cm (0cm), 5th: 2cm (0cm)  Eval:  L Digit extension: Active Gross digit extension through 10% of ROM  09/23/23:  L Digit extension: Active Gross digit extension through 50% of ROM   UPPER EXTREMITY MMT:     MMT Right eval Left eval Left 09/23/2023  Shoulder flexion 5 3- 3-/5  Shoulder abduction 5 3- 3-/5  Shoulder adduction     Shoulder extension     Shoulder internal rotation     Shoulder external rotation     Middle trapezius     Lower trapezius     Elbow flexion 5 TBD 3+/5  Elbow extension 5 TBD 4-/5  Wrist flexion   2/5  Wrist extension  2- 2/5  Wrist ulnar deviation     Wrist radial deviation     Wrist pronation  Wrist supination     (Blank rows = not tested)  HAND FUNCTION:   Grip strength:   Right: 94#, Left: 1#,   Lateral Key Pinch strength:   Right: 14#, Left: 3#  COORDINATION:  Box and Blocks: Right: 34 blocks completed in 1 min., Left: 0 blocks completed in 1 min.   SENSATION: Sensation feels different in both hands.  L hand Light touch- impaired L hand Proprioception- impaired  EDEMA: Has had hx of edema, and came to Eval session with swelling in the L hand/wrist/forearm.   *pt. Has a laceration from his dog on the dorsal aspect of the L hand.   MUSCLE TONE: Increased tone through the UE and hand flexors.   COGNITION: Overall cognitive status: Within functional limits for tasks assessed  VISION: Subjective report: L sided inattention Baseline vision: Wears glasses for reading only Visual history: Right visual occlusion  VISION ASSESSMENT: To be assessed  PERCEPTION: TBD  PRAXIS: Impaired                                                                                                                 TREATMENT DATE: 10/24/2023  Manual Therapy:  -Pt. Tolerated retrograde massage to the Left hand for edema control 2/2 increased edema from  the distal tips of the digits through the hand and into the wrist, and forearm.  -Care was taken to avoid Bandaid covered areas on his hand, and forearm.  Therapeutic Ex -LUE AAROM/AROM/PROM was performed with slow prolonged gentle stretching wrist flexion/extension, and digit extension/flexion, forearm supination/pronation.  Therapeutic Activities:  -Pt. Worked on grasping 1 cubes, and 1.5 [place]  Discs using a gross grip in preparation for progression of reps of forearm supination/pronation, wrist extension/flexion following with elbow extension to release items onto textured floor level surface.  -Pt. Required proximal support at the L wrist and elbow while performing a combination of movements.  -To up grade the task, Pt. Performed AAROM shoulder flexion, elbow flexion while grasping objects from table top surface in preparation for movement patterns to release objects onto floor level.      PATIENT EDUCATION: Education details: [employer] E-Stim unit Person educated: Patient and spouse Education method: Explanation and Verbal cues Education comprehension: verbalized understanding and needs further education  HOME EXERCISE PROGRAM:  -grasping/releasing tennis ball sized circular sphere, holding while performing wrist extension -Repetitions for grasping/releasing 1 blocks.  GOALS: Goals reviewed with patient? Yes  SHORT TERM GOALS: Target date: 10/01/2023   Pt. Will be independent with HEP for the LUE.  Baseline: 09/19/23: Requires assist from his wife Eval; no current HEP Goal status: Ongoing   LONG TERM GOALS: Target date: 11/12/2023  Pt. Will increase L shoulder flexion by 10 degrees to be able to reach up to shelves.  Baseline: 09/19/23: Pt. Continues to be limited with reaching up to shelves. Eval: L shoulder flexion is 89 (110). Goal status: Ongoing  2.  Pt. Will increase L shoulder abduction by 10 degrees to assist with underarm hygiene.  Baseline: 09/19/23: Pt.  Continues to require assist with underarm hygiene.Eval: L shoulder abduction is 98 (120).  Goal status: Ongoing  3.  Pt. Will improve L wrist extension by 10 degrees to be able to initiate anticipation of grasping for objects.  Baseline: 09/19/23: Consistent activation noted in the left wrist extensors with facilitation. Eval: -48 (42) Goal status: Ongoing  4.  Pt. Will improve active gross digit extension through 50% of the range to be able to release objects in his hand consistently.  Baseline: 09/19/23: gross digit extension noted through 25% range with facilitation.Eval: gross digit extension is 10% of the range  Goal status: Goal updated to 50% of the range  5.  Pt. Will independently demonstrate visual compensatory strategies when navigating through environment and during tabletop tasks. Baseline: 09/19/23: Pt. Requires fewer cues for left sided awareness Eval: Pt. Requires consistent cuing for L sided awareness.  Goal status: Ongoing  ASSESSMENT:  CLINICAL IMPRESSION: Pt. Reports that he continues to have L dorsal forearm pain while using walker splint during transfers. Pt. Was able to tolerate several  reps of AAROM/AROM L wrist extension/flexion, L forearm pronation/supination and L elbow extension. Pt. Has difficulty forming a L lateral grasp onto objects during tabletop tasks today, utilizing more of a gross grip when grasping objects with L hand. Pt. Continues to have limited L wrist extension and forearm supination/pronation, however, after several reps of pronation/supination, Pt. Was able to perform approximately half of the ROM. Pt. Required Mod A with proximal support at L wrist and elbow during movement patterns and while grasping/releasing objects. Pt continues to benefit from OT services to work on improving LUE functioning to increase engagement of the LUE during daily task, and to provide education about compensatory strategies during ADLs/IADLs.  PERFORMANCE DEFICITS: in  functional skills including ADLs, IADLs, coordination, dexterity, proprioception, sensation, edema, tone, ROM, strength, pain, Fine motor control, Gross motor control, mobility, balance, endurance, vision, and UE functional use, cognitive skills including perception, and psychosocial skills including coping strategies, environmental adaptation, and routines and behaviors.   IMPAIRMENTS: are limiting patient from ADLs, IADLs, and leisure.   CO-MORBIDITIES: may have co-morbidities  that affects occupational performance. Patient will benefit from skilled OT to address above impairments and improve overall function.  MODIFICATION OR ASSISTANCE TO COMPLETE EVALUATION: Min-Moderate modification of tasks or assist with assess necessary to complete an evaluation.  OT OCCUPATIONAL PROFILE AND HISTORY: Detailed assessment: Review of records and additional review of physical, cognitive, psychosocial history related to current functional performance.  CLINICAL DECISION MAKING: Moderate - several treatment options, min-mod task modification necessary  REHAB POTENTIAL: Good  EVALUATION COMPLEXITY: Moderate    PLAN:  OT FREQUENCY: 2x/week  OT DURATION: 12 weeks  PLANNED INTERVENTIONS: 97168 OT Re-evaluation, 97535 self care/ADL training, 02889 therapeutic exercise, 97530 therapeutic activity, 97112 neuromuscular re-education, 97140 manual therapy, 97018 paraffin, 02989 moist heat, 97010 cryotherapy, 97034 contrast bath, 97760 Orthotic Initial, 97763 Orthotic/Prosthetic subsequent, passive range of motion, visual/perceptual remediation/compensation, energy conservation, and DME and/or AE instructions  RECOMMENDED OTHER SERVICES: PT and ST  CONSULTED AND AGREED WITH PLAN OF CARE: Patient and family member/caregiver  PLAN FOR NEXT SESSION: see above  Damien Nap, OTS   This entire session was performed under direct supervision and direction of a licensed therapist/therapist assistant . I have  personally read, edited and approve of the note as written.  Richardson Otter, MS, OTR/L   10/24/2023

## 2023-10-24 NOTE — Therapy (Signed)
 OUTPATIENT PHYSICAL THERAPY NEURO TREATMENT   Patient Name: [name]. MRN: [id number] DOB:1950/12/21, 73 y.o., male Today's Date: 10/24/2023   PCP: Douglas Remak, MD  REFERRING PROVIDER:    Pegge Wright PARAS, PA-C    END OF SESSION:    PT End of Session - 10/24/23 1518     Visit Number 21    Number of Visits 24    Date for PT Re-Evaluation 11/12/23    Progress Note Due on Visit 30    PT Start Time 1530    PT Stop Time 1613    PT Time Calculation (min) 43 min    Equipment Utilized During Treatment Gait belt    Activity Tolerance Patient tolerated treatment well    Behavior During Therapy WFL for tasks assessed/performed           Past Medical History:  Diagnosis Date   Anemia    Cervical myelopathy (HCC)    Chronic venous insufficiency    [name] (chronic kidney disease), stage III (HCC)    Diabetes mellitus without complication (HCC)    GERD (gastroesophageal reflux disease)    Hypercholesteremia    Hypothyroidism    Kidney stones    about 50 in the past--last in March 4/24   Leg weakness, bilateral    due to Myasthenia Gravis   Myasthenia gravis (HCC)    Spinal stenosis    Vitamin B 12 deficiency    Vitamin B 12 deficiency    Wears hearing aid    bilateral   Past Surgical History:  Procedure Laterality Date   BACK SURGERY  09/2015   CARDIAC CATHETERIZATION     CATARACT EXTRACTION W/ INTRAOCULAR LENS IMPLANT     CATARACT EXTRACTION W/PHACO Right 12/21/2015   Procedure: CATARACT EXTRACTION PHACO AND INTRAOCULAR LENS PLACEMENT (IOC);  Surgeon: Dene Etienne, MD;  Location: [facility];  Service: Ophthalmology;  Laterality: Right;  DIABETIC - insulin  and oral meds   COLONOSCOPY     SHOULDER SURGERY     labrum repair   TONSILLECTOMY     Patient Active Problem List   Diagnosis Date Noted   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  Rationale for Evaluation and Treatment: Rehabilitation  SUBJECTIVE:  SUBJECTIVE STATEMENT:   Patient reports no changes since last time.  Still has not been wearing a Swedish knee cage.  Was encouraged to do so again the patient is doing it with transfers and was encouraged the more he uses it the easier route will be to put on the more efficient for him and his wife will be at putting it on.  Patient verbalized understanding. From Initial Eval: Pt reports that he is doing well. Has not been moving much since discharging from CIR, as therapists instructed pt to perform transfers only at home due to high fall risk with ambulation. Continues to have significant weakenss in the LUE with difficulty extending fingers as well as numbness and inattention to the L side of body resulting in multiple cuts on Arm and leg with WC mobility in home.  Will be attaining custom power WC from [employer]. Has loaner currently.   Pt accompanied by: significant other [name]   PERTINENT HISTORY: [name]. is a 73 y.o. male with history of T2DM, HTN, [name], renal calculi, seropositive myasthenia gravis who was admitted to [facility] on 07/14/2023 with reports 3 to 4-day history of stuttering speech and fluctuating LUE  weakness followed by fall.  He reported some double vision and per discussion with his neurology I had increased prednisone  to 10 mg/day due to concerns of MG flare.  He was found to have left facial weakness with dysarthria as well as LUE and LLE weakness.  MRI brain done revealing acute right MCA infarction involving right periventricular white matter and extending into right frontal and parietal lobes.    Douglas LITTIE [name]. was admitted to rehab 07/19/2023 for inpatient therapies to consist of PT, ST and OT at least three hours five days a week. Past admission physiatrist, therapy team and rehab RN have worked together to provide customized collaborative inpatient rehab.  He continues on low-dose aspirin  and subcu Lovenox  was used for DVT prophylaxis during his stay.  His blood pressures were monitored on TID basis and has been stable on current regimen.  Serial check of CBC shows H&H to be relatively stable stool guaiac was negative x 1.  He continues to have lower extremity edema and was educated on importance of elevation for edema control.  Constipation has resolved with sorbitol  use on intermittent basis.  P.o. intake has been good.  He was noted to have Candida crura's which was treated with course of Diflucan.  MASD has resolved with use of Gerhardt's Butt cream.   Left hemiplegia as been a limiting factor with left knee pain noted with increase in activity.  EKG was added for support and pain management.  His diabetes has been monitored with ac/hs CBG checks and SSI was use prn for tighter BS control.  Metformin  was resumed at 5 mg twice daily and basal insulin  was slowly titrated upwards to 26 units.  Blood sugars continue to be variable and he was advised to increase metformin  to home dose and titrate insulin  glargine slowly if blood sugars range over 150.  He has made steady gains during his stay and continues to be limited by mild left inattention with left knee plegia and weakness. He has been  evaluated for  speciality wheelchair by [employer]. He will continue to receive follow up outpatient PT, OT and ST at [facility] after discharge.   PAIN:  Are you having pain? No and reports Numbness in the L H and and Leg   PRECAUTIONS:  Fall  **reports gets rash from contact with latex  RED FLAGS: Hx of MG   WEIGHT BEARING RESTRICTIONS: No  FALLS: Has patient fallen in last 6 months? Yes. Number of falls 1  LIVING ENVIRONMENT: Lives with: lives with their spouse Lives in: House/apartment Stairs: Ramps installed while in the Hospital  Has following equipment at home: Vannie - 2 wheeled and Wheelchair (power)  PLOF: Independent with basic ADLs, Independent with household mobility without device, and Independent with community mobility with device with [facility]   PATIENT GOALS: relearn to Walk 15-73ft.   OBJECTIVE:  Note: Objective measures were completed at Evaluation unless otherwise noted.  DIAGNOSTIC FINDINGS: MRI brain done revealing acute right MCA infarction involving right periventricular white matter and extending into right frontal and parietal lobes.  COGNITION: Overall cognitive status: Impaired poor awareness of L side while dual tasking.    SENSATION: Light touch: Impaired   COORDINATION: Limited ROM and strength on the LUE limiting finger to nose.  ROM and strength deficits limit ankle to knee.   EDEMA:  Mild edema in the L UE.   MUSCLE TONE: LLE: Mild    POSTURE: flexed trunk. Lateral lean to the L   LOWER EXTREMITY ROM:     Active  Right Eval Left Eval  Hip flexion    Hip extension    Hip abduction    Hip adduction    Hip internal rotation    Hip external rotation    Knee flexion    Knee extension    Ankle dorsiflexion    Ankle plantarflexion    Ankle inversion    Ankle eversion     (Blank rows = not tested)  LOWER EXTREMITY MMT:    MMT Right Eval Left Eval  Hip flexion 4+ 4-  Hip extension 4+ 3+  Hip abduction 4+ 3+  Hip  adduction 4+ 3+  Hip internal rotation    Hip external rotation    Knee flexion 5 4-  Knee extension 5 4-  Ankle dorsiflexion 5 4  Ankle plantarflexion    Ankle inversion    Ankle eversion    (Blank rows = not tested)  BED MOBILITY:  Findings: Sit to supine Mod A and Max A Supine to sit Mod A and Max A  TRANSFERS: Sit to stand: Min A and Mod A  Assistive device utilized: Environmental consultant - 2 wheeled     Stand to sit: Min A  Assistive device utilized: Environmental consultant - 2 wheeled     Chair to chair: Min A and Mod A  Assistive device utilized: Environmental consultant - 2 wheeled      PT required to stabilize the LUE on RW as well as provide max cues for attention to hand to prevent sliding off hand grip.  At home Pt utilizes hand splint for stability on RW to prevent loss of grip in transfers.   RAMP:  Not tested  CURB:  Not tested  STAIRS: Not tested GAIT: Findings: Gait Characteristics: step to pattern, decreased hip/knee flexion- Left, Left foot flat, ataxic, lateral lean- Left, wide BOS, and poor foot clearance- Left, Distance walked: 42ft, Assistive device utilized:Walker - 2 wheeled, Level of assistance: Mod A and Max A, and Comments: poor awareness of lateral/anterior L LOB as well as poor awareness of LUE causing loos of grip on RW and heavy lateral LOB to the L, +2 assist required to prevent fall and position chair under patient as he attempted turn at 61ft.    FUNCTIONAL TESTS:  5 times sit to stand: 1:29 min Timed up and go (TUG): unable to complete turn at 7ft.  10 meter walk test: unable to complete on this day.   PATIENT SURVEYS:  Stroke Impact Scale 49/80                                                                                                                              TREATMENT DATE: 10/24/2023   PHYSICAL PERFORMANCE Patient demonstrates increased fall risk as noted by score of  20 /56 on Berg Balance Scale.  (<36= high risk for falls, close to 100%; 37-45 significant >80%; 46-51  moderate >50%; 52-55 lower >25%)   [facility] PT Assessment - 10/24/23 0001       Berg Balance Test   Sit to Stand Needs minimal aid to stand or to stabilize    Standing Unsupported Able to stand 2 minutes with supervision    Sitting with Back Unsupported but Feet Supported on Floor or Stool Able to sit safely and securely 2 minutes    Stand to Sit Uses backs of legs against chair to control descent    Transfers Needs one person to assist    Standing Unsupported with Eyes Closed Able to stand 10 seconds with supervision    Standing Unsupported with Feet Together Needs help to attain position but able to stand for 30 seconds with feet together    From Standing, Reach Forward with Outstretched Arm Reaches forward but needs supervision    From Standing Position, Pick up Object from Floor Unable to try/needs assist to keep balance    From Standing Position, Turn to Look Behind Over each Shoulder Needs supervision when turning    Turn 360 Degrees Needs assistance while turning    Standing Unsupported, Alternately Place Feet on Step/Stool Able to complete >2 steps/needs minimal assist    Standing Unsupported, One Foot in Front Needs help to step but can hold 15 seconds    Standing on One Leg Tries to lift leg/unable to hold 3 seconds but remains standing independently    Total Score 20           TA- To improve functional movements patterns for everyday tasks   STS and transfer practice x 8 total transfers ( 4 chairs facing one another for variation of turns when transitioning), cues for hand and arm positioning and still has tendency to push WC away on some reps   Transfer and gait practice x 4 rounds of various lengths (15-25 ft with turns and obstacles) and various transfer positions to standard chair, improved walker management but still requires cues when going to standing  -one LOB with mod A to correct steps in midline with LLE and gets opposing caught up on one turn transition.   Pt  required occasional rest breaks due fatigue, PT was attentive to when pt appeared to be tired or winded in order to prevent excessive fatigue.  Unless otherwise stated,  CGA was provided and gait belt donned in order to ensure pt safety    PATIENT EDUCATION: Education details: Pt educated throughout session about proper posture and technique with exercises. Improved exercise technique, movement at target joints, use of target muscles after min to mod verbal, visual, tactile cues.  Person educated: Patient and Spouse Education method: Explanation, VC/TC/Demo Education comprehension: verbalized understanding  HOME EXERCISE PROGRAM:  Access Code: [email or web address] URL: [email or web address] Date: 09/11/2023 Prepared by: Massie Dollar  Exercises - Seated Knee Extension AROM  - 1 x daily - 4 x weekly - 3 sets - 10 reps - Seated Hip Abduction  - 1 x daily - 4 x weekly - 3 sets - 10 reps - Seated March  - 1 x daily - 4 x weekly - 3 sets - 10 reps - Sit to Stand with Counter Support  - 1 x daily - 4 x weekly - 3 sets - 10 reps - Supine Short Arc Quad  - 1 x daily - 4 x weekly - 3 sets - 10 reps - Supine Hip Abduction  - 1 x daily - 4 x weekly - 3 sets - 10 reps - Supine Bridge  - 1 x daily - 4 x weekly - 3 sets - 3 reps - 2sec  hold  GOALS: Goals reviewed with patient? Yes  SHORT TERM GOALS: Target date: 09/25/2023    Patient will be independent in home exercise program to improve strength/mobility for better functional independence with ADLs. Baseline: Initiated on 09/11/2023 10/22/2023: will update when appropriate Goal status: IN PROGRESS   LONG TERM GOALS: Target date: 11/13/2023    Patient will increase SIS-16 score to equal to or greater than 10points to demonstrate statistically significant improvement in mobility and quality of life.  Baseline: 49/80 09/19/2023: 45/80 10/22/2023: 46/80 Goal status: IN PROGRESS  2.  Patient (> 16 years old) will complete five times sit  to stand test in < 15 seconds indicating an increased LE strength and improved balance. Baseline: 1:31min 09/19/2023: 56.39 seconds using R UE support to push-up from armrest of green chair and requiring skilled min A for balance and lifting/lowering  10/22/2023: 27.10 seconds using R UE support to push-up from armrest  Goal status: IN PROGRESS  3.  Patient will increase FIST score by > 6 points to demonstrate decreased fall risk during functional activities Baseline:  43/56  09/23/2023: 53/56 Goal status: MET and UPGRADED  09/23/2023: Patient will increase Berg Balance score to > 45/56 to demonstrate improved balance and decreased fall risk during functional activities and ADLs.  Baseline: 09/23/2023: 14/56 7/31: 20 Goal status: IN PROGRESS  4.  Patient will increase 10 meter walk test to >0.59m/s as to improve gait speed for better community ambulation and to reduce fall risk. Baseline: 09/09/2023: 0.103 m/s ( and 37 seconds) using bari-RW with skilled heavy min A and +2 w/c follow for safety  09/19/2023: 0.24m/s using bari-RW with skilled min A of 1 and +2 w/c follow for safety 10/22/2023: 0.1875 m/s using bari-RW with L hand splint, L swedish knee cage, and only skilled light min A of 1 (no wheelchair follow)  Goal status: IN PROGRESS  5.  Patient will reduce timed up and go to <11 seconds to reduce fall risk and demonstrate improved transfer/gait ability. Baseline: unable to complete turn at 10 ft. Mod-max assist due to poor control of LW on RW, resulting in L lateral LOB  09/09/2023: and 32 seconds using bari-RW with L hand splint  and +2 on R side for safety, therapist providing skilled min A on L side (Had pt set-up L hand on RW splint, get L foot in proper positioning, and scoot forward in seat before starting the timer) 09/19/2023: 89min43 seconds using bari-RW with only skilled min A of 1 (Had pt set-up L hand on RW splint, get L foot in proper positioning, and scoot forward in seat  before starting the timer) 7/2: 78sec(1:18.125) with RW and min assist overall.  10/22/2023: 1 min, 30 seconds using bari-RW with L hand splint and skilled light min assist  Goal status: IN PROGRESS   ASSESSMENT:  CLINICAL IMPRESSION:    Patient presents with excellent motivation for completion of physical therapy activities.  Patient continues with high repetition practice of sit to stand and transfer training.  Patient practiced various transfers including chairs that were to his right, left, and straight across working on various motor plans for these transfers.  Patient requires high amount of cues initially but did show some improvement with practice.  Patient did have 1 loss of balance when finishing a segment of walking and going to transition to seated replace his left lower extremity to close to midline and his right lower extremity got tripped up on it in swing phase.  Physical therapist is able to assist to prevent fall.  Patient shows improvement with his Lars balance scale but is still at very high risk factor for falls based on his current score. Pt will continue to benefit from skilled physical therapy intervention to address impairments, improve QOL, and attain therapy goals.    OBJECTIVE IMPAIRMENTS: Abnormal gait, cardiopulmonary status limiting activity, decreased activity tolerance, decreased balance, decreased cognition, decreased coordination, decreased endurance, decreased knowledge of condition, decreased knowledge of use of DME, decreased mobility, difficulty walking, decreased ROM, decreased strength, decreased safety awareness, dizziness, hypomobility, increased fascial restrictions, impaired perceived functional ability, increased muscle spasms, impaired sensation, impaired tone, impaired UE functional use, impaired vision/preception, improper body mechanics, postural dysfunction, and obesity.   ACTIVITY LIMITATIONS: carrying, lifting, bending, sitting, standing, squatting,  stairs, transfers, bed mobility, bathing, toileting, dressing, reach over head, hygiene/grooming, locomotion level, and caring for others  PARTICIPATION LIMITATIONS: meal prep, cleaning, laundry, medication management, interpersonal relationship, driving, shopping, community activity, and yard work  PERSONAL FACTORS: Age, Past/current experiences, Time since onset of injury/illness/exacerbation, and 3+ comorbidities: MG, HTN, CVA, lymphedema are also affecting patient's functional outcome.   REHAB POTENTIAL: Good  CLINICAL DECISION MAKING: Unstable/unpredictable  EVALUATION COMPLEXITY: High  PLAN:  PT FREQUENCY: 1-2x/week  PT DURATION: 12 weeks  PLANNED INTERVENTIONS: 97164- PT Re-evaluation, 97750- Physical Performance Testing, 97110-Therapeutic exercises, 97530- Therapeutic activity, [id number]- Neuromuscular re-education, 97535- Self Care, [id number]- Manual therapy, [id number]- Gait training, [phone]- Orthotic Initial, [phone]- Orthotic/Prosthetic subsequent, [phone]- Canalith repositioning, [id number]- Electrical stimulation (unattended), [phone]- Electrical stimulation (manual), [id number]- Wound care (first 20 sq cm), 97598- Wound care (each additional 20 sq cm), Patient/Family education, Balance training, Stair training, Taping, Dry Needling, Joint mobilization, Joint manipulation, Vestibular training, Visual/preceptual remediation/compensation, Cognitive remediation, DME instructions, Wheelchair mobility training, Cryotherapy, and Moist heat  PLAN FOR NEXT SESSION:   Gait training with L hand splint on bari-RW  Continue progression of turning through doorways and turning to sit in chair at end of gait (bias having chair on his L for increased challenge) as well as navigating around obstacles Continue reinforcing pt management of L hand placement on/off hand splint during transfers to hopefully reach mod-I stand pivot transfer level at home prior to reaching ambulatory  level Cue pt to pull AD towards him with L hand  when transitioning sit>stand Reinforce education on wearing Swedish knee cage at home Family education for gait in simulated home environment.    Note: Portions of this document were prepared using Dragon voice recognition software and although reviewed may contain unintentional dictation errors in syntax, grammar, or spelling.  Lonni KATHEE Gainer PT ,DPT Physical Therapist- [place]  [facility]   3:19 PM 10/24/23

## 2023-10-29 ENCOUNTER — Ambulatory Visit: Admitting: Speech Pathology

## 2023-10-29 ENCOUNTER — Ambulatory Visit: Admitting: Occupational Therapy

## 2023-10-29 ENCOUNTER — Ambulatory Visit: Attending: Physician Assistant

## 2023-10-29 DIAGNOSIS — R269 Unspecified abnormalities of gait and mobility: Secondary | ICD-10-CM | POA: Insufficient documentation

## 2023-10-29 DIAGNOSIS — R278 Other lack of coordination: Secondary | ICD-10-CM

## 2023-10-29 DIAGNOSIS — R41841 Cognitive communication deficit: Secondary | ICD-10-CM | POA: Diagnosis present

## 2023-10-29 DIAGNOSIS — I69354 Hemiplegia and hemiparesis following cerebral infarction affecting left non-dominant side: Secondary | ICD-10-CM | POA: Diagnosis present

## 2023-10-29 DIAGNOSIS — R2681 Unsteadiness on feet: Secondary | ICD-10-CM | POA: Insufficient documentation

## 2023-10-29 DIAGNOSIS — R262 Difficulty in walking, not elsewhere classified: Secondary | ICD-10-CM | POA: Diagnosis present

## 2023-10-29 DIAGNOSIS — M6281 Muscle weakness (generalized): Secondary | ICD-10-CM | POA: Diagnosis present

## 2023-10-29 NOTE — Therapy (Signed)
 OUTPATIENT SPEECH LANGUAGE PATHOLOGY  COGNITION TREATMENT NOTE DISCHARGE SUMMARY   Patient Name: [name]. MRN: [id number] DOB:Apr 03, 1950, 73 y.o., male Today's Date: 10/29/2023  PCP: Rolin Stank, MD REFERRING PROVIDER: Toribio pitch, PA-C    End of Session - 10/29/23 1056     Visit Number 17    Number of Visits 25    Date for SLP Re-Evaluation 11/12/23    Authorization Type [employer]    Authorization Time Period 10/22/2023 thru 11/19/2023    Authorization - Visit Number 3    Authorization - Number of Visits 8    Progress Note Due on Visit 20    SLP Start Time 1100    SLP Stop Time  1145    SLP Time Calculation (min) 45 min    Activity Tolerance Patient tolerated treatment well           Past Medical History:  Diagnosis Date   Anemia    Cervical myelopathy (HCC)    Chronic venous insufficiency    [name] (chronic kidney disease), stage III (HCC)    Diabetes mellitus without complication (HCC)    GERD (gastroesophageal reflux disease)    Hypercholesteremia    Hypothyroidism    Kidney stones    about 50 in the past--last in March 4/24   Leg weakness, bilateral    due to Myasthenia Gravis   Myasthenia gravis (HCC)    Spinal stenosis    Vitamin B 12 deficiency    Vitamin B 12 deficiency    Wears hearing aid    bilateral   Past Surgical History:  Procedure Laterality Date   BACK SURGERY  09/2015   CARDIAC CATHETERIZATION     CATARACT EXTRACTION W/ INTRAOCULAR LENS IMPLANT     CATARACT EXTRACTION W/PHACO Right 12/21/2015   Procedure: CATARACT EXTRACTION PHACO AND INTRAOCULAR LENS PLACEMENT (IOC);  Surgeon: Dene Etienne, MD;  Location: [facility];  Service: Ophthalmology;  Laterality: Right;  DIABETIC - insulin  and oral meds   COLONOSCOPY     SHOULDER SURGERY     labrum repair   TONSILLECTOMY     Patient Active Problem List   Diagnosis Date Noted   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) 10/11/2023   High blood pressure 08/13/2023   Anemia 08/13/2023   Constipation 08/13/2023   Myasthenia gravis (HCC) 08/13/2023   Acute ischemic right MCA stroke (HCC) 07/19/2023   Lymphedema 12/27/2016   Leg pain 07/15/2016   Chronic venous insufficiency 07/15/2016   Swelling of limb 07/15/2016   Type 2 diabetes mellitus (HCC) 07/15/2016   Hyperlipidemia 07/15/2016    ONSET DATE: 07/19/2023   REFERRING DIAG: P36.488 (ICD-10-CM) - Cerebral infarction due to unspecified occlusion or stenosis of right middle cerebral artery  THERAPY DIAG:  Cognitive communication deficit  Rationale for Evaluation and Treatment Rehabilitation  SUBJECTIVE:   PERTINENT HISTORY and DIAGNOSTIC FINDINGS:   [name] is a 73 y.o. 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  CT Brain w/o contrast: 07/14/2023 New hypodensity within the right periventricular white matter extending into the inferior frontal lobe/operculum compatible with a recent infarct.   MRI Brain w/o contrast: 07/15/2023 IMPRESSION:  Acute right MCA distribution infarct involving the right periventricular white matter extending into the right frontal and parietal lobes and insula , some of which correlates  to the recent CT finding. No significant associated mass effect or associated hemorrhage.   MRA head angiogram 07/16/2023 IMPRESSION:  Focal occlusion of the right inferior division distal M2 MCA (6:123) in keeping with known right MCA infarct when compared to prior MRI brain 07/15/2023.   CT head 07/17/2023 IMPRESSION:  Expansion of territory involved in the right MCA infarct compared with MRI of 07/15/2023. Increasing local mass effect with new leftward midline shift of 3 mm. No hemorrhage.   Speech Language  Evaluation at [facility] 07/19/2023 Patient presents with cognitive deficits in the areas of problem solving and left inattention as evidenced throughout patient/family interview and performance on Cognistat evaluation. Patient further demonstrates impairments in the areas of intellectual, anticipatory, and emergent awareness of deficits/mistakes.   Speech Language Evaluation at [facility] 07/31/2023 Skilled therapy session focused on re-evaluation of cognitive skills utilizing the CLQT standardized assessment. Patient scored with overall mild cognitive deficits in the subtests of attention, executive functioning and visual spatial skills. Patient scored WFL on the clock drawing task, language and memory.   PAIN:  Are you having pain? No   FALLS: Has patient fallen in last 6 months?  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 SLP facilitated session by providing the following interventions:  SLP further facilitated session by providing semi-complex visual scanning tasks.  Pt completed independently with 98% accuracy. Good use of self-monitoring/double checking.   PATIENT EDUCATION: Education details: see above Person educated: Patient and Spouse Education method: Explanation Education comprehension: needs further education   HOME EXERCISE PROGRAM:   See above for details    GOALS:  Goals reviewed with patient? Yes  SHORT TERM GOALS: Target date: 10  sessions  Updated: 09/25/2023  Updated: 10/29/2023 The patient will complete a simple word search puzzle (66 or smaller) within 10 minutes given intermittent moderate verbal cues. Baseline: Goal status: INITIAL: MET  2.  The patient will recall sentence-level information after a 5-minute delay using the spaced retrieval technique. Baseline:  Goal status: INITIAL: MET  3.  With moderate cues, pt will identify 3 cognitive strengths/weaknesses during tasks.  Baseline:  Goal status: INITIAL: MET  4. Pt will complete semi-complex reasoning task with supervision A to achieve > 90% accuracy.  Baseline:   Goal status: INITIAL: MET  5. Pt will recall 2-3 compensatory memory strategies that he has implemented during functional activity over 3 out of 5 session. Baseline: Goal Status: INITIAL: MET.     LONG TERM GOALS: Target date: 11/12/2023  Updated: 09/25/2023  Updated: 10/29/2023 The patient will read sentence-level information aloud at 80% accuracy given intermittent minimal verbal cues in order to increase ability to safely live independently. Baseline:  Goal status: INITIAL: MET  2.   With Min A, patient will use external aid for functional recall of completed/upcoming activities 75% accuracy.     Baseline:  Goal status: INITIAL: MET  3.   With Min A, patient will complete semi-complex visual memory tasks with 75% accuracy independently.   Baseline:  Goal status: INITIAL: MET  4.  With Supervision A, patient will use strategies (ie., white board, daily planner/calendar, Apps on phone) to improve memory for important information with 80% accuracy.  Baseline: Goal status: INITIAL: MET    ASSESSMENT:  CLINICAL IMPRESSION: Patient is a 73 y.o. right handed male who was seen today for a cognitive communication treatment d/t right MCA CVA. Pt has made great progress over the course of skilled ST sessions. As a result, he has effectively met his STG and LTGs with report of  increased functional independence at home. He continues to provide tax assistance, pay bills and use compensatory strategies independently. All education has been completed and pt is appropriate for discharge from services.    PLAN: Pt is appropriate for discharge from services.   Keriana Sarsfield B. Rubbie, M.S., CCC-SLP, Tree surgeon Certified Brain Injury Specialist [facility]  [facility] Rehabilitation Services Office [phone] Ascom [phone] Fax [phone]

## 2023-10-29 NOTE — Therapy (Addendum)
 Occupational Therapy Progress/Recertification Note  Dates of reporting period  09/19/23  to   10/29/23   Patient Name: [name]. MRN: [id number] DOB:13-May-1950, 73 y.o., male Today's Date: 10/29/2023  PCP: Alm Rower, MD REFERRING PROVIDER: Hermann Toribio PARAS, PA-C  END OF SESSION:  OT End of Session - 10/29/23 1151     Visit Number 20    Number of Visits 24    Date for OT Re-Evaluation 01/21/24    OT Start Time 1145    OT Stop Time 1230    OT Time Calculation (min) 45 min    Activity Tolerance Patient tolerated treatment well    Behavior During Therapy WFL for tasks assessed/performed              Past Medical History:  Diagnosis Date   Anemia    Cervical myelopathy (HCC)    Chronic venous insufficiency    [name] (chronic kidney disease), stage III (HCC)    Diabetes mellitus without complication (HCC)    GERD (gastroesophageal reflux disease)    Hypercholesteremia    Hypothyroidism    Kidney stones    about 50 in the past--last in March 4/24   Leg weakness, bilateral    due to Myasthenia Gravis   Myasthenia gravis (HCC)    Spinal stenosis    Vitamin B 12 deficiency    Vitamin B 12 deficiency    Wears hearing aid    bilateral   Past Surgical History:  Procedure Laterality Date   BACK SURGERY  09/2015   CARDIAC CATHETERIZATION     CATARACT EXTRACTION W/ INTRAOCULAR LENS IMPLANT     CATARACT EXTRACTION W/PHACO Right 12/21/2015   Procedure: CATARACT EXTRACTION PHACO AND INTRAOCULAR LENS PLACEMENT (IOC);  Surgeon: Dene Etienne, MD;  Location: [facility];  Service: Ophthalmology;  Laterality: Right;  DIABETIC - insulin  and oral meds   COLONOSCOPY     SHOULDER SURGERY     labrum repair   TONSILLECTOMY     Patient Active Problem List   Diagnosis Date Noted   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) 10/11/2023   High blood pressure 08/13/2023   Anemia 08/13/2023   Constipation 08/13/2023    Myasthenia gravis (HCC) 08/13/2023   Acute ischemic right MCA stroke (HCC) 07/19/2023   Lymphedema 12/27/2016   Leg pain 07/15/2016   Chronic venous insufficiency 07/15/2016   Swelling of limb 07/15/2016   Type 2 diabetes mellitus (HCC) 07/15/2016   Hyperlipidemia 07/15/2016   ONSET DATE: 07/13/2023  REFERRING DIAG: Acute ischemic R MCA CVA  THERAPY DIAG: muscle weakness (generalized), other lack of coordination, vision disturbance, hemiplegia and hemiparesis following cerebral infarction affecting left non-dominant side (HCC)  Rationale for Evaluation and Treatment: Rehabilitation  SUBJECTIVE:  SUBJECTIVE STATEMENT: Pt. Reports that he continues to have Left  volar forearm discomfort  at home when using walker splint.  Pt accompanied by: significant other  PERTINENT HISTORY: Pt. Was admitted to [facility] on 07/13/2023 after sustaining a CVA while on a trip to the beach. Pt. Was diagnosed with R MCA CVA. Pt. Was admitted to inpatient rehab from 07/19/2023-08/13/2023. Past medical history includes high BP, anemia, and Myasthenia Gravis.   PRECAUTIONS: None  WEIGHT BEARING RESTRICTIONS: No  PAIN:  10/29/23: No report of pain 10/24/2023: No pain during treatment session, pt. Reported having pain in L dorsal forearm when using walker splint at home.  10/22/2023: No pain. 10/14/2023: No pain 10/07/23: 4/10 pain L shoulder Are you  having pain?  10/01/2023- Yes, 7/10 in L shoulder due to most recent fall that occurred this past Friday. 5/10 L wrist  FALLS: Has patient fallen in last 6 months? Yes  LIVING ENVIRONMENT: Lives with: lives with their family and lives with their spouse-  Lives in: House/apartment- 2 story home and resides mostly on the 1st floor Stairs: No- Ramped entrance Has following equipment at home:  Vannie, cane, Steadi, shower chair, handheld shower head, bedside commode  PLOF: Independent and Independent with basic ADLs Independent at home   PATIENT GOALS: Would like to  walk 10-20 feet each time.   OBJECTIVE:  Note: Objective measures were completed at Evaluation unless otherwise noted.  HAND DOMINANCE: Right  ADLs: Overall ADLs:  Transfers/ambulation related to ADLs: Eating: Independent, 100% R handed Grooming: independent UB Dressing: Can put on shirt independently LB Dressing: Difficulty with pants and requires assistance getting the pants on his feet, has difficulty putting on shoes and socks Toileting: Tot A for toilet hygiene. Uses Steadi during toilet transfers. Bathing: Requires assist with using the L arm to wash the R arm. Tub Shower transfers: Roll in shower, built in shower chair, grab bars and hand held shower head.  IADLs: Shopping: Total A (Wife typically did the shopping prior to onset)   Light housekeeping: Total A (wife typically performs prior to onset) Meal Prep: Independent with light snack prep Community mobility: No driving, able to get in/out of the car Medication management: Set up assistance from his wife using a pill box (pt. Is responsible for taking medications at the correct time) Financial management: family members check behind him. Handwriting: TBD Hobbies: gardening, wood working and sports- [employer] Work history: Owns a tax business MOBILITY STATUS: Needs Assist:    POSTURE COMMENTS:   Sitting balance: Good supported sitting balance  FUNCTIONAL OUTCOME MEASURES: TBD   UPPER EXTREMITY ROM:    Active ROM Right Eval  [facility] Right 09/23/2023 [facility] Left eval Left 09/23/2023 Left 10/29/23  Shoulder flexion   89(110) 109(114) 111(115)  Shoulder abduction   98(120) 109(120) 111(122)  Shoulder adduction       Shoulder extension       Shoulder internal rotation       Shoulder external rotation       Elbow flexion   120(140) 130(136) 141(145)  Elbow extension   -28(-10) -5(0) -18(0)  Wrist flexion   60(60) Rest at 60 50(65) 54(60)  Wrist extension   -48(42) 10(40) 20(50)  Wrist ulnar deviation     12(22)  Wrist  radial deviation     10(20)  Wrist pronation     (82)90  Wrist supination     64(74)  (Blank rows = not tested)  Eval: L Digit flexion: 2nd:3cm (0cm) 3rd: 0cm (0cm), 4th: 0cm (0cm), 5th: 2cm (0cm)  09/23/23:  L Digit flexion to [facility]: 2nd: 3.5cm (0cm), 3rd: 4 cm(0cm), 4th 3.5cm (0cm), 5th: 2cm (0cm)  Eval:  L Digit extension: Active Gross digit extension through 10% of ROM  09/23/23:  L Digit extension: Active Gross digit extension through 50% of ROM  10/29/23:  L Digit extension: Active Gross digit extension through 25-50% of ROM    UPPER EXTREMITY MMT:     MMT Right eval Left eval Left 09/23/2023 Left 10/29/23  Shoulder flexion 5 3- 3-/5 3-/5  Shoulder abduction 5 3- 3-/5 3-/5  Shoulder adduction      Shoulder extension      Shoulder internal rotation      Shoulder  external rotation      Middle trapezius      Lower trapezius      Elbow flexion 5 TBD 3+/5 4-/5  Elbow extension 5 TBD 4-/5 3-/5  Wrist flexion   2/5 3-/5  Wrist extension  2- 2/5 2+/5  Wrist ulnar deviation      Wrist radial deviation      Wrist pronation    3/5  Wrist supination    3-/5  (Blank rows = not tested)  HAND FUNCTION:   Eval:  Grip strength: Right: 94#, Left: 1#,   Lateral Key Pinch strength: Right: 14#, Left: 3#  10/29/23:   Grip strength: Left: 16#  Lateral Key Pinch strength: Left: 5#  COORDINATION:  Box and Blocks: Right: 34 blocks completed in 1 min., Left: 0 blocks completed in 1 min.    10/29/23:  Box and Blocks: Right: 34 blocks completed in 1 min., Left: 2 blocks completed in 1 min.  9 Hole Peg Test:  Left: To remove 9 pegs from a vertical position: 1 min. &13 sec.  SENSATION: Sensation feels different in both hands.  L hand Light touch- impaired L hand Proprioception- impaired  EDEMA: Has had hx of edema, and came to Eval session with swelling in the L hand/wrist/forearm.   *pt. Has a laceration from his dog on the dorsal aspect of the L hand.   MUSCLE  TONE: Increased tone through the UE and hand flexors.   COGNITION: Overall cognitive status: Within functional limits for tasks assessed  VISION: Subjective report: L sided inattention Baseline vision: Wears glasses for reading only Visual history: Right visual occlusion  VISION ASSESSMENT: To be assessed  PERCEPTION: TBD  PRAXIS: Impaired                                                                                                                 TREATMENT DATE: 10/29/2023  Measurements were obtained and goals were reviewed with the Pt.    PATIENT EDUCATION: Education details: goals, POC Person educated: Patient and spouse Education method: Explanation and Verbal cues Education comprehension: verbalized understanding and needs further education  HOME EXERCISE PROGRAM:  -grasping/releasing tennis ball sized circular sphere, holding while performing wrist extension -Repetitions for grasping/releasing 1 blocks.  GOALS: Goals reviewed with patient? Yes  SHORT TERM GOALS: Target date:.   Pt. Will be independent with HEP for the LUE.  Baseline: 10/29/23: Pt. requires assist from wife with HEPs. Pt. Is consistently working on HEP's at home. 09/19/23: Requires assist from his wife Eval; no current HEP Goal status: Ongoing  LONG TERM GOALS: Target date: 01/21/2024  Pt. Will increase L shoulder flexion by 10 degrees to be able to reach up to shelves.  Baseline: 10/29/23: 888(884) 09/19/23: Pt. Continues to be limited with reaching up to shelves. Eval: L shoulder flexion is 89 (110). Goal status: Ongoing  2.  Pt. Will increase L shoulder abduction by 10 degrees to assist with underarm hygiene.  Baseline:10/29/23: 888(877) 09/19/23: Pt. Continues to require assist with underarm  hygiene.Eval: L shoulder abduction is 98 (120).  Goal status: Ongoing  3.  Pt. Will improve L wrist extension by 10 degrees to be able to initiate anticipation of grasping for objects.  Baseline:  10/29/23: 20(55) 09/19/23: Consistent activation noted in the left wrist extensors with facilitation. Eval: -48 (42) Goal status: Ongoing  4.  Pt. Will improve active gross digit extension through 50% of the range to be able to release objects in his hand consistently.  Baseline: 10/28/20: Gross digit extension through 25-50% of the range. 09/19/23: gross digit extension noted through 25% range with facilitation.Eval: gross digit extension is 10% of the range  Goal status: Goal updated to 50% of the range  5.  Pt. Will independently demonstrate visual compensatory strategies when navigating through environment and during tabletop tasks. Baseline: 10/29/2023: Pt. Continues to less cuing for left sided awareness. 09/19/23: Pt. requires fewer cues for left sided awareness Eval: Pt. Requires consistent cuing for L sided awareness.  Goal status: Ongoing   6. Pt. Will improve left hand [facility] skills to be able to manipulate small objects during ADLs, and IADLs.   Baseline: 10/29/23: 9 Hole Peg Test: Pt. Was able to remove 9 pegs from vertical position on the pegboard in 1 min. & 13 sec. Pt. Is not yet able to pick up pegs from a horizontal position to place them in the pegboard.   Goal status: New   7. Pt. Will improve left hand function skills as evidence by improve score to 4 blocks on the Blocks, and Box test.   Baseline: 10/29/23: Box and Blocks Test: 2 Blocks completed in 1 min.   Goal Status: New  ASSESSMENT:  CLINICAL IMPRESSION:  Measurements have been obtained, and goals have been reviewed with the Pt. Pt. has made excellent progress over the progress reporting period. Pt. has progressed with AROM forl eft shoulder flexion, abduction, elbow flexion, wrist extension, radial, and ulnar deviation, and forearm supination/pronation. Pt. hand improved with left grip strength, pinch strength, hand function, and FMC skills. Pt. is improving with grasping objects, however continues to present with difficulty  releasing them from his hand. New goals have been added to the POC for improved FMC s, and hand function. Pt continues to benefit from OT services to work on improving LUE functioning to increase engagement of the LUE during daily task, and to provide education about compensatory strategies during ADLs/IADLs.  PERFORMANCE DEFICITS: in functional skills including ADLs, IADLs, coordination, dexterity, proprioception, sensation, edema, tone, ROM, strength, pain, Fine motor control, Gross motor control, mobility, balance, endurance, vision, and UE functional use, cognitive skills including perception, and psychosocial skills including coping strategies, environmental adaptation, and routines and behaviors.   IMPAIRMENTS: are limiting patient from ADLs, IADLs, and leisure.   CO-MORBIDITIES: may have co-morbidities  that affects occupational performance. Patient will benefit from skilled OT to address above impairments and improve overall function.  MODIFICATION OR ASSISTANCE TO COMPLETE EVALUATION: Min-Moderate modification of tasks or assist with assess necessary to complete an evaluation.  OT OCCUPATIONAL PROFILE AND HISTORY: Detailed assessment: Review of records and additional review of physical, cognitive, psychosocial history related to current functional performance.  CLINICAL DECISION MAKING: Moderate - several treatment options, min-mod task modification necessary  REHAB POTENTIAL: Good  EVALUATION COMPLEXITY: Moderate    PLAN:  OT FREQUENCY: 2x/week  OT DURATION: 12 weeks  PLANNED INTERVENTIONS: 97168 OT Re-evaluation, 97535 self care/ADL training, 02889 therapeutic exercise, 97530 therapeutic activity, 97112 neuromuscular re-education, 97140 manual therapy, 97018 paraffin, 02989 moist  heat, 97010 cryotherapy, 97034 contrast bath, 97760 Orthotic Initial, 02236 Orthotic/Prosthetic subsequent, passive range of motion, visual/perceptual remediation/compensation, energy conservation, and  DME and/or AE instructions  RECOMMENDED OTHER SERVICES: PT and ST  CONSULTED AND AGREED WITH PLAN OF CARE: Patient and family member/caregiver  PLAN FOR NEXT SESSION: see above  Richardson Otter, MS, OTR/L   10/29/2023

## 2023-10-29 NOTE — Therapy (Signed)
 OUTPATIENT PHYSICAL THERAPY TREATMENT   Patient Name: [name]. MRN: [id number] DOB:1950/05/09, 73 y.o., male Today's Date: 10/29/2023 PCP: Dennise Remak, MD  REFERRING PROVIDER:    Pegge Toribio PARAS, PA-C    END OF SESSION:    PT End of Session - 10/29/23 1032     Visit Number 22    Number of Visits 24    Date for PT Re-Evaluation 11/12/23    Authorization Type [employer]    Progress Note Due on Visit 30    PT Start Time 1015    PT Stop Time 1055    PT Time Calculation (min) 40 min    Equipment Utilized During Treatment Gait belt    Activity Tolerance Patient tolerated treatment well;No increased pain    Behavior During Therapy WFL for tasks assessed/performed           Past Medical History:  Diagnosis Date   Anemia    Cervical myelopathy (HCC)    Chronic venous insufficiency    [name] (chronic kidney disease), stage III (HCC)    Diabetes mellitus without complication (HCC)    GERD (gastroesophageal reflux disease)    Hypercholesteremia    Hypothyroidism    Kidney stones    about 50 in the past--last in March 4/24   Leg weakness, bilateral    due to Myasthenia Gravis   Myasthenia gravis (HCC)    Spinal stenosis    Vitamin B 12 deficiency    Vitamin B 12 deficiency    Wears hearing aid    bilateral   Past Surgical History:  Procedure Laterality Date   BACK SURGERY  09/2015   CARDIAC CATHETERIZATION     CATARACT EXTRACTION W/ INTRAOCULAR LENS IMPLANT     CATARACT EXTRACTION W/PHACO Right 12/21/2015   Procedure: CATARACT EXTRACTION PHACO AND INTRAOCULAR LENS PLACEMENT (IOC);  Surgeon: Dene Etienne, MD;  Location: [facility];  Service: Ophthalmology;  Laterality: Right;  DIABETIC - insulin  and oral meds   COLONOSCOPY     SHOULDER SURGERY     labrum repair   TONSILLECTOMY     Patient Active Problem List   Diagnosis Date Noted   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) 10/11/2023   High  blood pressure 08/13/2023   Anemia 08/13/2023   Constipation 08/13/2023   Myasthenia gravis (HCC) 08/13/2023   Acute ischemic right MCA stroke (HCC) 07/19/2023   Lymphedema 12/27/2016   Leg pain 07/15/2016   Chronic venous insufficiency 07/15/2016   Swelling of limb 07/15/2016   Type 2 diabetes mellitus (HCC) 07/15/2016   Hyperlipidemia 07/15/2016    ONSET DATE: 07/19/23  REFERRING DIAG:  Diagnosis  I63.511 (ICD-10-CM) - Cerebral infarction due to unspecified occlusion or stenosis of right middle cerebral artery    THERAPY DIAG:  Muscle weakness (generalized)  Difficulty in walking, not elsewhere classified  Unsteadiness on feet  Rationale for Evaluation and Treatment: Rehabilitation  SUBJECTIVE:  SUBJECTIVE STATEMENT:  No updates since prior visit. Transfers at home as well managed. Pt still not walking a thome as he has not been given clearance for this.   PERTINENT HISTORY:  CVA with Left hemiplegia in April 2025. Pt started on a swedish knee cage at CIR due to uncontrolled hyperextension in stance. Pt had not been moving much since discharging from CIR, as therapists instructed pt to perform transfers only at home due to high fall risk with ambulation. Continues to have significant weakenss in the LUE with difficulty extending fingers as well as numbness and inattention to the L side of body resulting in multiple cuts on Arm and leg with WC mobility in home. Will be attaining custom power WC from [employer]. Has loaner currently.   PAIN:  Are you having pain? No  PRECAUTIONS: Fall   *Latex allergy   WEIGHT BEARING RESTRICTIONS: No  FALLS: Has patient fallen in last 6 months? Yes. Number of falls 1  LIVING ENVIRONMENT: Lives with: lives with their spouse Lives in:  House/apartment Stairs: Ramps installed while in the Hospital  Has following equipment at home: Vannie - 2 wheeled and Wheelchair (power)  PLOF: Independent with basic ADLs, Independent with household mobility without device, and Independent with community mobility with device with [facility]   PATIENT GOALS: relearn to Walk 15-61ft.   OBJECTIVE:  DIAGNOSTIC FINDINGS:   MRI brain done revealing acute right MCA infarction involving right periventricular white matter and extending into right frontal and parietal lobes.  LOWER EXTREMITY MMT:    MMT Right Eval Left Eval  Hip flexion 4+ 4-  Hip extension 4+ 3+  Hip abduction 4+ 3+  Hip adduction 4+ 3+  Knee flexion 5 4-  Knee extension 5 4-  Ankle dorsiflexion 5 4  (Blank rows = not tested)                                                                                                                              TREATMENT DATE: 10/29/2023 -Transfer transport chair to plinth  -STS from slight elevation plinth 2x10 c BRW, LUE walker splint  -STS and AMB 6ft loop in rehab gym WITHOUT SKC, 1 LOB (pt voices difficulty with Left knee control)  -STS and AMB 55ft loop in rehab gym WITH SKC (more crossover difficulty, pt often stepping on his opposite foot)  -AMB 50ft loop without SKC, with BRW: normalizaton of step width noted, crossover eliminated, recurvatum persists *recommend formal assessment of knee extension ROM in supine with SKC donned, as I suspect it may be maintaining the knee in a flexed position, creating a shorter limb and subsequent adduction of the limb in gait cycle.   PATIENT EDUCATION: Education details: knee cage needs assessmnet as it is causeing crossover gait.  Person educated: Patient and Spouse Education method: Explanation, VC/TC/Demo Education comprehension: verbalized understanding  HOME EXERCISE PROGRAM: Access Code: [email or web address] URL: [email or web address] Date: 09/11/2023 Prepared by: Massie Dollar  Exercises -  Seated Knee Extension AROM  - 1 x daily - 4 x weekly - 3 sets - 10 reps - Seated Hip Abduction  - 1 x daily - 4 x weekly - 3 sets - 10 reps - Seated March  - 1 x daily - 4 x weekly - 3 sets - 10 reps - Sit to Stand with Counter Support  - 1 x daily - 4 x weekly - 3 sets - 10 reps - Supine Short Arc Quad  - 1 x daily - 4 x weekly - 3 sets - 10 reps - Supine Hip Abduction  - 1 x daily - 4 x weekly - 3 sets - 10 reps - Supine Bridge  - 1 x daily - 4 x weekly - 3 sets - 3 reps - 2sec  hold  GOALS: Goals reviewed with patient? Yes  SHORT TERM GOALS: Target date: 09/25/2023 Patient will be independent in home exercise program to improve strength/mobility for better functional independence with ADLs. Baseline: Initiated on 09/11/2023 10/22/2023: will update when appropriate Goal status: IN PROGRESS  LONG TERM GOALS: Target date: 11/13/2023  Patient will increase SIS-16 score to equal to or greater than 10points to demonstrate statistically significant improvement in mobility and quality of life.  Baseline: 49/80 09/19/2023: 45/80 10/22/2023: 46/80 Goal status: IN PROGRESS  2.  Patient (> 17 years old) will complete five times sit to stand test in < 15 seconds indicating an increased LE strength and improved balance. Baseline: 1:67min 09/19/2023: 56.39 seconds using R UE support to push-up from armrest of green chair and requiring skilled min A for balance and lifting/lowering  10/22/2023: 27.10 seconds using R UE support to push-up from armrest  Goal status: IN PROGRESS  3.  Patient will increase FIST score by > 6 points to demonstrate decreased fall risk during functional activities Baseline:  43/56  09/23/2023: 53/56 Goal status: MET and UPGRADED  09/23/2023: Patient will increase Berg Balance score to > 45/56 to demonstrate improved balance and decreased fall risk during functional activities and ADLs.  Baseline: 09/23/2023: 14/56 7/31: 20 Goal status: IN PROGRESS  4.   Patient will increase 10 meter walk test to >0.68m/s as to improve gait speed for better community ambulation and to reduce fall risk. Baseline: 09/09/2023: 0.103 m/s ( and 37 seconds) using bari-RW with skilled heavy min A and +2 w/c follow for safety  09/19/2023: 0.71m/s using bari-RW with skilled min A of 1 and +2 w/c follow for safety 10/22/2023: 0.1875 m/s using bari-RW with L hand splint, L swedish knee cage, and only skilled light min A of 1 (no wheelchair follow)  Goal status: IN PROGRESS  5.  Patient will reduce timed up and go to <11 seconds to reduce fall risk and demonstrate improved transfer/gait ability. Baseline: unable to complete turn at 10 ft. Mod-max assist due to poor control of LW on RW, resulting in L lateral LOB  09/09/2023: and 32 seconds using bari-RW with L hand splint and +2 on R side for safety, therapist providing skilled min A on L side (Had pt set-up L hand on RW splint, get L foot in proper positioning, and scoot forward in seat before starting the timer) 09/19/2023: 24min43 seconds using bari-RW with only skilled min A of 1 (Had pt set-up L hand on RW splint, get L foot in proper positioning, and scoot forward in seat before starting the timer) 7/2: 78sec(1:18.125) with RW and min assist overall.  10/22/2023: 1 min, 30 seconds using bari-RW  with L hand splint and skilled light min assist  Goal status: IN PROGRESS   ASSESSMENT:  CLINICAL IMPRESSION:    Continued focus on transfers and AMB. Transfers observed with and without knee cage, as patient is performing these ad lib at home without the knee cage in place. Similar with gait noted limited knee control without the knee cage, however the degree of LLE adduction (crossover) creates more of a safety issue than AMB without the knee cage- will knee to formally assess the ROM limitations of the cage and possibly seek equipment adjustment. Patient will benefit from skilled physical therapy intervention to reduce  deficits and impairments identified in evaluation, in order to reduce pain, improve quality of life, and maximize activity tolerance for ADL, IADL, and leisure/fitness. Physical therapy will help pt achieve long and short term goals of care.    OBJECTIVE IMPAIRMENTS: Abnormal gait, cardiopulmonary status limiting activity, decreased activity tolerance, decreased balance, decreased cognition, decreased coordination, decreased endurance, decreased knowledge of condition, decreased knowledge of use of DME, decreased mobility, difficulty walking, decreased ROM, decreased strength, decreased safety awareness, dizziness, hypomobility, increased fascial restrictions, impaired perceived functional ability, increased muscle spasms, impaired sensation, impaired tone, impaired UE functional use, impaired vision/preception, improper body mechanics, postural dysfunction, and obesity.   ACTIVITY LIMITATIONS: carrying, lifting, bending, sitting, standing, squatting, stairs, transfers, bed mobility, bathing, toileting, dressing, reach over head, hygiene/grooming, locomotion level, and caring for others  PARTICIPATION LIMITATIONS: meal prep, cleaning, laundry, medication management, interpersonal relationship, driving, shopping, community activity, and yard work  PERSONAL FACTORS: Age, Past/current experiences, Time since onset of injury/illness/exacerbation, and 3+ comorbidities: MG, HTN, CVA, lymphedema are also affecting patient's functional outcome.   REHAB POTENTIAL: Good  CLINICAL DECISION MAKING: Unstable/unpredictable  EVALUATION COMPLEXITY: High  PLAN:  PT FREQUENCY: 1-2x/week  PT DURATION: 12 weeks  PLANNED INTERVENTIONS: 97164- PT Re-evaluation, 97750- Physical Performance Testing, 97110-Therapeutic exercises, 97530- Therapeutic activity, [id number]- Neuromuscular re-education, 97535- Self Care, [id number]- Manual therapy, [id number]- Gait training, [id number]- Orthotic Initial, [id number]- Orthotic/Prosthetic subsequent,  [phone]- Canalith repositioning, [id number]- Electrical stimulation (unattended), [phone]- Electrical stimulation (manual), [id number]- Wound care (first 20 sq cm), 97598- Wound care (each additional 20 sq cm), Patient/Family education, Balance training, Stair training, Taping, Dry Needling, Joint mobilization, Joint manipulation, Vestibular training, Visual/preceptual remediation/compensation, Cognitive remediation, DME instructions, Wheelchair mobility training, Cryotherapy, and Moist heat  PLAN FOR NEXT SESSION:  Gait training with L hand splint on bari-RW  Continue reinforcing pt management of L hand placement on/off hand splint during transfers to hopefully reach mod-I stand pivot transfer level at home prior to reaching ambulatory level Family education for gait in simulated home environment.   1:36 PM, 10/29/23 Peggye JAYSON Linear, PT, DPT Physical Therapist - [facility]  Outpatient Physical Therapy- Main Campus [phone]

## 2023-10-31 ENCOUNTER — Ambulatory Visit: Admitting: Occupational Therapy

## 2023-10-31 ENCOUNTER — Ambulatory Visit

## 2023-10-31 ENCOUNTER — Ambulatory Visit: Admitting: Speech Pathology

## 2023-10-31 DIAGNOSIS — M6281 Muscle weakness (generalized): Secondary | ICD-10-CM | POA: Diagnosis not present

## 2023-10-31 DIAGNOSIS — R278 Other lack of coordination: Secondary | ICD-10-CM

## 2023-10-31 DIAGNOSIS — R269 Unspecified abnormalities of gait and mobility: Secondary | ICD-10-CM

## 2023-10-31 DIAGNOSIS — R262 Difficulty in walking, not elsewhere classified: Secondary | ICD-10-CM

## 2023-10-31 DIAGNOSIS — R2681 Unsteadiness on feet: Secondary | ICD-10-CM

## 2023-10-31 NOTE — Therapy (Signed)
 Occupational Therapy Neuro Treatment Note  Patient Name: [name]. MRN: [id number] DOB:02/11/1951, 73 y.o., male Today's Date: 10/31/2023  PCP: Alm Rower, MD REFERRING PROVIDER: Hermann Toribio PARAS, PA-C  END OF SESSION:  OT End of Session - 10/31/23 1259     Visit Number 21    Number of Visits 48    Date for OT Re-Evaluation 01/21/24    OT Start Time 1150    OT Stop Time 1230    OT Time Calculation (min) 40 min    Activity Tolerance Patient tolerated treatment well    Behavior During Therapy WFL for tasks assessed/performed              Past Medical History:  Diagnosis Date   Anemia    Cervical myelopathy (HCC)    Chronic venous insufficiency    [name] (chronic kidney disease), stage III (HCC)    Diabetes mellitus without complication (HCC)    GERD (gastroesophageal reflux disease)    Hypercholesteremia    Hypothyroidism    Kidney stones    about 50 in the past--last in March 4/24   Leg weakness, bilateral    due to Myasthenia Gravis   Myasthenia gravis (HCC)    Spinal stenosis    Vitamin B 12 deficiency    Vitamin B 12 deficiency    Wears hearing aid    bilateral   Past Surgical History:  Procedure Laterality Date   BACK SURGERY  09/2015   CARDIAC CATHETERIZATION     CATARACT EXTRACTION W/ INTRAOCULAR LENS IMPLANT     CATARACT EXTRACTION W/PHACO Right 12/21/2015   Procedure: CATARACT EXTRACTION PHACO AND INTRAOCULAR LENS PLACEMENT (IOC);  Surgeon: Dene Etienne, MD;  Location: [facility];  Service: Ophthalmology;  Laterality: Right;  DIABETIC - insulin  and oral meds   COLONOSCOPY     SHOULDER SURGERY     labrum repair   TONSILLECTOMY     Patient Active Problem List   Diagnosis Date Noted   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) 10/11/2023   High blood pressure 08/13/2023   Anemia 08/13/2023   Constipation 08/13/2023   Myasthenia gravis (HCC) 08/13/2023   Acute ischemic right MCA  stroke (HCC) 07/19/2023   Lymphedema 12/27/2016   Leg pain 07/15/2016   Chronic venous insufficiency 07/15/2016   Swelling of limb 07/15/2016   Type 2 diabetes mellitus (HCC) 07/15/2016   Hyperlipidemia 07/15/2016   ONSET DATE: 07/13/2023  REFERRING DIAG: Acute ischemic R MCA CVA  THERAPY DIAG: muscle weakness (generalized), other lack of coordination, vision disturbance, hemiplegia and hemiparesis following cerebral infarction affecting left non-dominant side (HCC)  Rationale for Evaluation and Treatment: Rehabilitation  SUBJECTIVE:  SUBJECTIVE STATEMENT: Pt. Pt accompanied by: significant other  PERTINENT HISTORY: Pt. Was admitted to [facility] on 07/13/2023 after sustaining a CVA while on a trip to the beach. Pt. Was diagnosed with R MCA CVA. Pt. Was admitted to inpatient rehab from 07/19/2023-08/13/2023. Past medical history includes high BP, anemia, and Myasthenia Gravis.   PRECAUTIONS: None  WEIGHT BEARING RESTRICTIONS: No  PAIN:  */07/25: No pain, however reports  discomfort in the left forearm with 4th, and 5th digit MP, and PIP extension.  10/29/23: No report of pain 10/24/2023: No pain during treatment session, pt. Reported having pain in L dorsal forearm when using walker splint at home.  10/22/2023: No pain. 10/14/2023: No pain 10/07/23: 4/10 pain L shoulder Are you having pain?  10/01/2023- Yes, 7/10 in L shoulder due  to most recent fall that occurred this past Friday. 5/10 L wrist  FALLS: Has patient fallen in last 6 months? Yes  LIVING ENVIRONMENT: Lives with: lives with their family and lives with their spouse-  Lives in: House/apartment- 2 story home and resides mostly on the 1st floor Stairs: No- Ramped entrance Has following equipment at home:  Vannie, cane, Steadi, shower chair, handheld shower head, bedside commode  PLOF: Independent and Independent with basic ADLs Independent at home   PATIENT GOALS: Would like to walk 10-20 feet each time.   OBJECTIVE:  Note:  Objective measures were completed at Evaluation unless otherwise noted.  HAND DOMINANCE: Right  ADLs: Overall ADLs:  Transfers/ambulation related to ADLs: Eating: Independent, 100% R handed Grooming: independent UB Dressing: Can put on shirt independently LB Dressing: Difficulty with pants and requires assistance getting the pants on his feet, has difficulty putting on shoes and socks Toileting: Tot A for toilet hygiene. Uses Steadi during toilet transfers. Bathing: Requires assist with using the L arm to wash the R arm. Tub Shower transfers: Roll in shower, built in shower chair, grab bars and hand held shower head.  IADLs: Shopping: Total A (Wife typically did the shopping prior to onset)   Light housekeeping: Total A (wife typically performs prior to onset) Meal Prep: Independent with light snack prep Community mobility: No driving, able to get in/out of the car Medication management: Set up assistance from his wife using a pill box (pt. Is responsible for taking medications at the correct time) Financial management: family members check behind him. Handwriting: TBD Hobbies: gardening, wood working and sports- [employer] Work history: Owns a tax business MOBILITY STATUS: Needs Assist:    POSTURE COMMENTS:   Sitting balance: Good supported sitting balance  FUNCTIONAL OUTCOME MEASURES: TBD   UPPER EXTREMITY ROM:    Active ROM Right Eval  [facility] Right 09/23/2023 [facility] Left eval Left 09/23/2023 Left 10/29/23  Shoulder flexion   89(110) 109(114) 111(115)  Shoulder abduction   98(120) 109(120) 111(122)  Shoulder adduction       Shoulder extension       Shoulder internal rotation       Shoulder external rotation       Elbow flexion   120(140) 130(136) 141(145)  Elbow extension   -28(-10) -5(0) -18(0)  Wrist flexion   60(60) Rest at 60 50(65) 54(60)  Wrist extension   -48(42) 10(40) 20(50)  Wrist ulnar deviation     12(22)  Wrist radial deviation     10(20)  Wrist pronation      (82)90  Wrist supination     64(74)  (Blank rows = not tested)  Eval: L Digit flexion: 2nd:3cm (0cm) 3rd: 0cm (0cm), 4th: 0cm (0cm), 5th: 2cm (0cm)  09/23/23:  L Digit flexion to [facility]: 2nd: 3.5cm (0cm), 3rd: 4 cm(0cm), 4th 3.5cm (0cm), 5th: 2cm (0cm)  Eval:  L Digit extension: Active Gross digit extension through 10% of ROM  09/23/23:  L Digit extension: Active Gross digit extension through 50% of ROM  10/29/23:  L Digit extension: Active Gross digit extension through 25-50% of ROM    UPPER EXTREMITY MMT:     MMT Right eval Left eval Left 09/23/2023 Left 10/29/23  Shoulder flexion 5 3- 3-/5 3-/5  Shoulder abduction 5 3- 3-/5 3-/5  Shoulder adduction      Shoulder extension      Shoulder internal rotation      Shoulder external rotation      Middle trapezius  Lower trapezius      Elbow flexion 5 TBD 3+/5 4-/5  Elbow extension 5 TBD 4-/5 3-/5  Wrist flexion   2/5 3-/5  Wrist extension  2- 2/5 2+/5  Wrist ulnar deviation      Wrist radial deviation      Wrist pronation    3/5  Wrist supination    3-/5  (Blank rows = not tested)  HAND FUNCTION:   Eval:  Grip strength: Right: 94#, Left: 1#,   Lateral Key Pinch strength: Right: 14#, Left: 3#  10/29/23:   Grip strength: Left: 16#  Lateral Key Pinch strength: Left: 5#  COORDINATION:  Box and Blocks: Right: 34 blocks completed in 1 min., Left: 0 blocks completed in 1 min.    10/29/23:  Box and Blocks: Right: 34 blocks completed in 1 min., Left: 2 blocks completed in 1 min.  9 Hole Peg Test:  Left: To remove 9 pegs from a vertical position: 1 min. &13 sec.  SENSATION: Sensation feels different in both hands.  L hand Light touch- impaired L hand Proprioception- impaired  EDEMA: Has had hx of edema, and came to Eval session with swelling in the L hand/wrist/forearm.   *pt. Has a laceration from his dog on the dorsal aspect of the L hand.   MUSCLE TONE: Increased tone through the UE and hand  flexors.   COGNITION: Overall cognitive status: Within functional limits for tasks assessed  VISION: Subjective report: L sided inattention Baseline vision: Wears glasses for reading only Visual history: Right visual occlusion  VISION ASSESSMENT: To be assessed  PERCEPTION: TBD  PRAXIS: Impaired                                                                                                                 TREATMENT DATE: 10/31/2023  Manual Therapy:   -Pt. Tolerated STM to the left ulnar aspect of the volar forearm 2/2 to discomfort when extending the left 4th, and 5th digits.  -Pt. Tolerated carpal, and metacarpal spread stretches to prepare the hand for engagement in functional tasks.  Therapeutic Activities:   -Facilitated left hand gross grasping pegs from the therapist hand in preparation for placing them vertically into a pegboard positioned at the tabletop surface with assist and support  required proximally when supinating the forearm in preparation for placing them into the pegboard. -Facilitated lateral grasping with the left hand in preparation for removing large colorful pegs from a vertical position in a pegboard placed at the tabletop surface. -Reaching was incorporated into the task to challenge reaching out  in front of him with elbow extension to place them into a container across the table. Pt. Then worked on performing reps of reaching up through multiple planes to discard the items into a container. -Rest breaks were required with proprioception through the left hand hand at the tabletop to normalize the tone 2/2 left hand tightness. -facilitated [facility] skills with the left 2nd digit  sliding 1.5 discs off the edge of an elevated surface to  the thumb in preparation for grasping them with a 2pt. Pinch grasp requiring hand  over hand assist of the left 2nd digit, and thumb. The  elevated surface was modified from 1 height to a 4 height for improved hand grasp position.   -Pt. Worked on holding 1.5 discs in his hand while reaching to place them into a wide mouthed container.  PATIENT EDUCATION: Education details:  functional reaching, and grasping. Person educated: Patient and spouse Education method: Explanation and Verbal cues Education comprehension: verbalized understanding and needs further education  HOME EXERCISE PROGRAM:  -grasping/releasing tennis ball sized circular sphere, holding while performing wrist extension -Repetitions for grasping/releasing 1 blocks.  GOALS: Goals reviewed with patient? Yes  SHORT TERM GOALS: Target date:.   Pt. Will be independent with HEP for the LUE.  Baseline: 10/29/23: Pt. requires assist from wife with HEPs. Pt. Is consistently working on HEP's at home. 09/19/23: Requires assist from his wife Eval; no current HEP Goal status: Ongoing  LONG TERM GOALS: Target date: 01/21/2024  Pt. Will increase L shoulder flexion by 10 degrees to be able to reach up to shelves.  Baseline: 10/29/23: 888(884) 09/19/23: Pt. Continues to be limited with reaching up to shelves. Eval: L shoulder flexion is 89 (110). Goal status: Ongoing  2.  Pt. Will increase L shoulder abduction by 10 degrees to assist with underarm hygiene.  Baseline:10/29/23: 888(877) 09/19/23: Pt. Continues to require assist with underarm hygiene.Eval: L shoulder abduction is 98 (120).  Goal status: Ongoing  3.  Pt. Will improve L wrist extension by 10 degrees to be able to initiate anticipation of grasping for objects.  Baseline: 10/29/23: 20(55) 09/19/23: Consistent activation noted in the left wrist extensors with facilitation. Eval: -48 (42) Goal status: Ongoing  4.  Pt. Will improve active gross digit extension through 50% of the range to be able to release objects in his hand consistently.  Baseline: 10/28/20: Gross digit extension through 25-50% of the range. 09/19/23: gross digit extension noted through 25% range with facilitation.Eval: gross digit  extension is 10% of the range  Goal status: Goal updated to 50% of the range  5.  Pt. Will independently demonstrate visual compensatory strategies when navigating through environment and during tabletop tasks. Baseline: 10/29/2023: Pt. Continues to less cuing for left sided awareness. 09/19/23: Pt. requires fewer cues for left sided awareness Eval: Pt. Requires consistent cuing for L sided awareness.  Goal status: Ongoing   6. Pt. Will improve left hand [facility] skills to be able to manipulate small objects during ADLs, and IADLs.   Baseline: 10/29/23: 9 Hole Peg Test: Pt. Was able to remove 9 pegs from vertical position on the pegboard in 1 min. & 13 sec. Pt. Is not yet able to pick up pegs from a horizontal position to place them in the pegboard.   Goal status: New   7. Pt. Will improve left hand function skills as evidence by improve score to 4 blocks on the Blocks, and Box test.   Baseline: 10/29/23: Box and Blocks Test: 2 Blocks completed in 1 min.   Goal Status: New  ASSESSMENT:  CLINICAL IMPRESSION:  Pt. presented with improved edema in the left hand upon arrival today. Pt. continues to present with discomfort in the left volar forearm with 4th, and 5th digit MP extension. Pt. Reports forearm is still painful when attempting to use the walker hand attachment splint. Pt. Continues to require assist to proximally  at the left elbow during all grasping, and reaching  tasks.  Pt. required hand over hand assist for the 2nd digit, and thumb, as well as increasing the height of the  elevated surface when grasping the discs. Pt continues to benefit from OT services to work on improving LUE functioning to increase engagement of the LUE during daily task, and to provide education about compensatory strategies during ADLs/IADLs.  PERFORMANCE DEFICITS: in functional skills including ADLs, IADLs, coordination, dexterity, proprioception, sensation, edema, tone, ROM, strength, pain, Fine motor control, Gross  motor control, mobility, balance, endurance, vision, and UE functional use, cognitive skills including perception, and psychosocial skills including coping strategies, environmental adaptation, and routines and behaviors.   IMPAIRMENTS: are limiting patient from ADLs, IADLs, and leisure.   CO-MORBIDITIES: may have co-morbidities  that affects occupational performance. Patient will benefit from skilled OT to address above impairments and improve overall function.  MODIFICATION OR ASSISTANCE TO COMPLETE EVALUATION: Min-Moderate modification of tasks or assist with assess necessary to complete an evaluation.  OT OCCUPATIONAL PROFILE AND HISTORY: Detailed assessment: Review of records and additional review of physical, cognitive, psychosocial history related to current functional performance.  CLINICAL DECISION MAKING: Moderate - several treatment options, min-mod task modification necessary  REHAB POTENTIAL: Good  EVALUATION COMPLEXITY: Moderate    PLAN:  OT FREQUENCY: 2x/week  OT DURATION: 12 weeks  PLANNED INTERVENTIONS: 97168 OT Re-evaluation, 97535 self care/ADL training, 02889 therapeutic exercise, 97530 therapeutic activity, 97112 neuromuscular re-education, 97140 manual therapy, 97018 paraffin, 02989 moist heat, 97010 cryotherapy, 97034 contrast bath, 97760 Orthotic Initial, 97763 Orthotic/Prosthetic subsequent, passive range of motion, visual/perceptual remediation/compensation, energy conservation, and DME and/or AE instructions  RECOMMENDED OTHER SERVICES: PT and ST  CONSULTED AND AGREED WITH PLAN OF CARE: Patient and family member/caregiver  PLAN FOR NEXT SESSION: see above  Richardson Otter, MS, OTR/L   10/31/2023

## 2023-10-31 NOTE — Therapy (Signed)
 OUTPATIENT PHYSICAL THERAPY TREATMENT   Patient Name: [name]. MRN: [id number] DOB:[birth year], 73 y.o., male Today's Date: 10/31/2023 PCP: Dennise Remak, MD  REFERRING PROVIDER:    Pegge Toribio PARAS, PA-C    END OF SESSION:    PT End of Session - 10/31/23 1155     Visit Number 23    Number of Visits 24    Date for PT Re-Evaluation 11/12/23    Authorization Type [employer] Medicare    Authorization Time Period 07/31/23-11/12/23 (25 visits)    Progress Note Due on Visit 30    PT Start Time 1104    PT Stop Time 1148    PT Time Calculation (min) 44 min    Equipment Utilized During Treatment Gait belt    Activity Tolerance Patient tolerated treatment well;No increased pain;Patient limited by fatigue    Behavior During Therapy [facility] for tasks assessed/performed           Past Medical History:  Diagnosis Date   Anemia    Cervical myelopathy (HCC)    Chronic venous insufficiency    [name] (chronic kidney disease), stage III (HCC)    Diabetes mellitus without complication (HCC)    GERD (gastroesophageal reflux disease)    Hypercholesteremia    Hypothyroidism    Kidney stones    about 50 in the past--last in March 4/24   Leg weakness, bilateral    due to Myasthenia Gravis   Myasthenia gravis (HCC)    Spinal stenosis    Vitamin B 12 deficiency    Vitamin B 12 deficiency    Wears hearing aid    bilateral   Past Surgical History:  Procedure Laterality Date   BACK SURGERY  09/2015   CARDIAC CATHETERIZATION     CATARACT EXTRACTION W/ INTRAOCULAR LENS IMPLANT     CATARACT EXTRACTION W/PHACO Right 12/21/2015   Procedure: CATARACT EXTRACTION PHACO AND INTRAOCULAR LENS PLACEMENT (IOC);  Surgeon: Dene Etienne, MD;  Location: [facility];  Service: Ophthalmology;  Laterality: Right;  DIABETIC - insulin  and oral meds   COLONOSCOPY     SHOULDER SURGERY     labrum repair   TONSILLECTOMY     Patient Active Problem List   Diagnosis Date Noted   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) 10/11/2023   High blood pressure 08/13/2023   Anemia 08/13/2023   Constipation 08/13/2023   Myasthenia gravis (HCC) 08/13/2023   Acute ischemic right MCA stroke (HCC) 07/19/2023   Lymphedema 12/27/2016   Leg pain 07/15/2016   Chronic venous insufficiency 07/15/2016   Swelling of limb 07/15/2016   Type 2 diabetes mellitus (HCC) 07/15/2016   Hyperlipidemia 07/15/2016    ONSET DATE: 07/19/23  REFERRING DIAG:  Diagnosis  I63.511 (ICD-10-CM) - Cerebral infarction due to unspecified occlusion or stenosis of right middle cerebral artery    THERAPY DIAG:  Muscle weakness (generalized)  Other lack of coordination  Difficulty in walking, not elsewhere classified  Unsteadiness on feet  Abnormality of gait and mobility  Rationale for Evaluation and Treatment: Rehabilitation  SUBJECTIVE:  SUBJECTIVE STATEMENT:  No updates since prior visit. Transfers at home as well managed. Pt still not walking at home as he has not been given clearance for this.   PERTINENT HISTORY:  CVA with Left hemiplegia in April 2025. Pt started on a swedish knee cage at [facility] due to uncontrolled hyperextension in stance. Pt had not been moving much since discharging from [facility], as therapists instructed pt to perform transfers only at home due to high fall risk with ambulation. Continues to have significant weakenss in the LUE with difficulty extending fingers as well as numbness and inattention to the L side of body resulting in multiple cuts on Arm and leg with WC mobility in home. Will be attaining custom power WC from [employer]. Has loaner currently.   PAIN:  Are you having pain? No  PRECAUTIONS: Fall   *Latex allergy   WEIGHT BEARING RESTRICTIONS: No  FALLS:  Has patient fallen in last 6 months? Yes. Number of falls 1  LIVING ENVIRONMENT: Lives with: lives with their spouse Lives in: House/apartment Stairs: Ramps installed while in the Hospital  Has following equipment at home: Vannie - 2 wheeled and Wheelchair (power)  PLOF: Independent with basic ADLs, Independent with household mobility without device, and Independent with community mobility with device with [facility]   PATIENT GOALS: relearn to Walk 15-71ft.   OBJECTIVE:  DIAGNOSTIC FINDINGS:   MRI brain done revealing acute right MCA infarction involving right periventricular white matter and extending into right frontal and parietal lobes.  LOWER EXTREMITY MMT:    MMT Right Eval Left Eval  Hip flexion 4+ 4-  Hip extension 4+ 3+  Hip abduction 4+ 3+  Hip adduction 4+ 3+  Knee flexion 5 4-  Knee extension 5 4-  Ankle dorsiflexion 5 4  (Blank rows = not tested)                                                                                                                              TREATMENT DATE: 10/31/2023 minA STS from transport chiar, minGuard step pivot to plinth with BRW and Left hand splint -modA to supine -total assist SKC donning, Left knee ROM assessment at 30 degrees of TKE, then after adjustment of SKC, 27 degrees.  -pt placed in locking ACL brace for remainder of session -STS from elevated plinth 10x with 3 mini squats each time -standing LLE marching x15, RLE marching 2x12 -RLE 8 step taps x15  -LLE lateral and medial stepping practice x12 modA (very difficult managing limb, lots of problem solving needed)    PATIENT EDUCATION: Education details: knee cage needs assessmnet as it is causeing crossover gait.  Person educated: Patient and Spouse Education method: Explanation, VC/TC/Demo Education comprehension: verbalized understanding  HOME EXERCISE PROGRAM: Access Code: [email or web address] URL: [email or web address] Date: 09/11/2023 Prepared by: Massie Dollar  Exercises - Seated Knee Extension AROM  - 1 x daily - 4 x weekly - 3 sets - 10 reps -  Seated Hip Abduction  - 1 x daily - 4 x weekly - 3 sets - 10 reps - Seated March  - 1 x daily - 4 x weekly - 3 sets - 10 reps - Sit to Stand with Counter Support  - 1 x daily - 4 x weekly - 3 sets - 10 reps - Supine Short Arc Quad  - 1 x daily - 4 x weekly - 3 sets - 10 reps - Supine Hip Abduction  - 1 x daily - 4 x weekly - 3 sets - 10 reps - Supine Bridge  - 1 x daily - 4 x weekly - 3 sets - 3 reps - 2sec  hold  GOALS: Goals reviewed with patient? Yes  SHORT TERM GOALS: Target date: 09/25/2023 Patient will be independent in home exercise program to improve strength/mobility for better functional independence with ADLs. Baseline: Initiated on 09/11/2023 10/22/2023: will update when appropriate Goal status: IN PROGRESS  LONG TERM GOALS: Target date: 11/13/2023  Patient will increase SIS-16 score to equal to or greater than 10points to demonstrate statistically significant improvement in mobility and quality of life.  Baseline: 49/80 09/19/2023: 45/80 10/22/2023: 46/80 Goal status: IN PROGRESS  2.  Patient (> 59 years old) will complete five times sit to stand test in < 15 seconds indicating an increased LE strength and improved balance. Baseline: 1:39min 09/19/2023: 56.39 seconds using R UE support to push-up from armrest of green chair and requiring skilled min A for balance and lifting/lowering  10/22/2023: 27.10 seconds using R UE support to push-up from armrest  Goal status: IN PROGRESS  3.  Patient will increase FIST score by > 6 points to demonstrate decreased fall risk during functional activities Baseline:  43/56  09/23/2023: 53/56 Goal status: MET and UPGRADED  09/23/2023: Patient will increase Berg Balance score to > 45/56 to demonstrate improved balance and decreased fall risk during functional activities and ADLs.  Baseline: 09/23/2023: 14/56 7/31: 20 Goal status: IN PROGRESS  4.   Patient will increase 10 meter walk test to >0.53m/s as to improve gait speed for better community ambulation and to reduce fall risk. Baseline: 09/09/2023: 0.103 m/s ( and 37 seconds) using bari-RW with skilled heavy min A and +2 w/c follow for safety  09/19/2023: 0.56m/s using bari-RW with skilled min A of 1 and +2 w/c follow for safety 10/22/2023: 0.1875 m/s using bari-RW with L hand splint, L swedish knee cage, and only skilled light min A of 1 (no wheelchair follow)  Goal status: IN PROGRESS  5.  Patient will reduce timed up and go to <11 seconds to reduce fall risk and demonstrate improved transfer/gait ability. Baseline: unable to complete turn at 10 ft. Mod-max assist due to poor control of LW on RW, resulting in L lateral LOB  09/09/2023: and 32 seconds using bari-RW with L hand splint and +2 on R side for safety, therapist providing skilled min A on L side (Had pt set-up L hand on RW splint, get L foot in proper positioning, and scoot forward in seat before starting the timer) 09/19/2023: 37min43 seconds using bari-RW with only skilled min A of 1 (Had pt set-up L hand on RW splint, get L foot in proper positioning, and scoot forward in seat before starting the timer) 7/2: 78sec(1:18.125) with RW and min assist overall.  10/22/2023: 1 min, 30 seconds using bari-RW with L hand splint and skilled light min assist  Goal status: IN PROGRESS   ASSESSMENT:  CLINICAL IMPRESSION:  As suspected, SKC is too small for patient, adjustments made to maximal extent of device with pt only able to achieve ~27 degrees of TKE which explains increased difficulty with crossover in gait. Pt placed in trial locking ACL brace today to prevent recurvatum, pt with pt being able to stand in full extension he notes big changes in ability to manage limb. Will contact [facility] about options for getting appropriate brace size as current SKC is making his stepping patterns more dangerous at present. Patient will  benefit from skilled physical therapy intervention to reduce deficits and impairments identified in evaluation, in order to reduce pain, improve quality of life, and maximize activity tolerance for ADL, IADL, and leisure/fitness. Physical therapy will help pt achieve long and short term goals of care.    OBJECTIVE IMPAIRMENTS: Abnormal gait, cardiopulmonary status limiting activity, decreased activity tolerance, decreased balance, decreased cognition, decreased coordination, decreased endurance, decreased knowledge of condition, decreased knowledge of use of DME, decreased mobility, difficulty walking, decreased ROM, decreased strength, decreased safety awareness, dizziness, hypomobility, increased fascial restrictions, impaired perceived functional ability, increased muscle spasms, impaired sensation, impaired tone, impaired UE functional use, impaired vision/preception, improper body mechanics, postural dysfunction, and obesity.   ACTIVITY LIMITATIONS: carrying, lifting, bending, sitting, standing, squatting, stairs, transfers, bed mobility, bathing, toileting, dressing, reach over head, hygiene/grooming, locomotion level, and caring for others  PARTICIPATION LIMITATIONS: meal prep, cleaning, laundry, medication management, interpersonal relationship, driving, shopping, community activity, and yard work  PERSONAL FACTORS: Age, Past/current experiences, Time since onset of injury/illness/exacerbation, and 3+ comorbidities: MG, HTN, CVA, lymphedema are also affecting patient's functional outcome.   REHAB POTENTIAL: Good  CLINICAL DECISION MAKING: Unstable/unpredictable  EVALUATION COMPLEXITY: High  PLAN:  PT FREQUENCY: 1-2x/week  PT DURATION: 12 weeks  PLANNED INTERVENTIONS: [id number]- PT Re-evaluation, 97750- Physical Performance Testing, 97110-Therapeutic exercises, 97530- Therapeutic activity, [id number]- Neuromuscular re-education, 97535- Self Care, [id number]- Manual therapy, [id number]- Gait training,  [id number]- Orthotic Initial, [id number]- Orthotic/Prosthetic subsequent, [id number]- Canalith repositioning, H9716- Electrical stimulation (unattended), [phone]- Electrical stimulation (manual), [id number]- Wound care (first 20 sq cm), 97598- Wound care (each additional 20 sq cm), Patient/Family education, Balance training, Stair training, Taping, Dry Needling, Joint mobilization, Joint manipulation, Vestibular training, Visual/preceptual remediation/compensation, Cognitive remediation, DME instructions, Wheelchair mobility training, Cryotherapy, and Moist heat  PLAN FOR NEXT SESSION:  Gait training with L hand splint on bari-RW  Continue reinforcing pt management of L hand placement on/off hand splint during transfers to hopefully reach mod-I stand pivot transfer level at home prior to reaching ambulatory level Family education for gait in simulated home environment.   11:57 AM, 10/31/23 Peggye JAYSON Linear, PT, DPT Physical Therapist - [place] [facility]  Outpatient Physical Therapy- Main Campus [phone]

## 2023-11-04 ENCOUNTER — Telehealth: Payer: Self-pay

## 2023-11-04 NOTE — Telephone Encounter (Signed)
 Chartered loss adjuster contacted [facility] regarding pt's current Swedish Knee Cage- Chartered loss adjuster recently noted that device is preventing pt from achieving full knee extension, rather with SKC adjusted to largest setting, pt is only able to achieve 27 degrees of knee extension. This has created a shorter limb in stance and gait, along with LLE crossover gait often stepping on his Rt shoe when stepping with difficulty correcting. Author feels pt needs a larger device or different device, defer to prosthetist.   Dawn with [facility] will contact pt and schedule ann appointment for him to come into office and be evaluated for device.   8:43 AM, 11/04/23 Peggye JAYSON Linear, PT, DPT Physical Therapist - [facility]  Outpatient Physical Therapy- Main Campus [phone]

## 2023-11-05 ENCOUNTER — Ambulatory Visit: Admitting: Physical Therapy

## 2023-11-05 ENCOUNTER — Ambulatory Visit

## 2023-11-05 ENCOUNTER — Ambulatory Visit: Admitting: Speech Pathology

## 2023-11-05 DIAGNOSIS — M6281 Muscle weakness (generalized): Secondary | ICD-10-CM

## 2023-11-05 DIAGNOSIS — I69354 Hemiplegia and hemiparesis following cerebral infarction affecting left non-dominant side: Secondary | ICD-10-CM

## 2023-11-05 DIAGNOSIS — R269 Unspecified abnormalities of gait and mobility: Secondary | ICD-10-CM

## 2023-11-05 DIAGNOSIS — R262 Difficulty in walking, not elsewhere classified: Secondary | ICD-10-CM

## 2023-11-05 DIAGNOSIS — R278 Other lack of coordination: Secondary | ICD-10-CM

## 2023-11-05 DIAGNOSIS — R2681 Unsteadiness on feet: Secondary | ICD-10-CM

## 2023-11-05 NOTE — Therapy (Signed)
 OUTPATIENT PHYSICAL THERAPY TREATMENT/ Re-certification   Patient Name: [name]. MRN: [id number] DOB:[birth year], 73 y.o., male Today's Date: 11/05/2023 PCP: Dennise Remak, MD  REFERRING PROVIDER:    Pegge Toribio PARAS, PA-C    END OF SESSION:    PT End of Session - 11/05/23 0931     Visit Number 24    Number of Visits 40    Date for PT Re-Evaluation 12/31/23    Authorization Type [employer] Medicare    Authorization Time Period 07/31/23-11/12/23 (25 visits)    Progress Note Due on Visit 30    PT Start Time 0930    PT Stop Time 1010    PT Time Calculation (min) 40 min    Equipment Utilized During Treatment Gait belt    Activity Tolerance Patient tolerated treatment well;No increased pain;Patient limited by fatigue    Behavior During Therapy [facility] for tasks assessed/performed           Past Medical History:  Diagnosis Date   Anemia    Cervical myelopathy (HCC)    Chronic venous insufficiency    [name] (chronic kidney disease), stage III (HCC)    Diabetes mellitus without complication (HCC)    GERD (gastroesophageal reflux disease)    Hypercholesteremia    Hypothyroidism    Kidney stones    about 50 in the past--last in March 4/24   Leg weakness, bilateral    due to Myasthenia Gravis   Myasthenia gravis (HCC)    Spinal stenosis    Vitamin B 12 deficiency    Vitamin B 12 deficiency    Wears hearing aid    bilateral   Past Surgical History:  Procedure Laterality Date   BACK SURGERY  09/2015   CARDIAC CATHETERIZATION     CATARACT EXTRACTION W/ INTRAOCULAR LENS IMPLANT     CATARACT EXTRACTION W/PHACO Right 12/21/2015   Procedure: CATARACT EXTRACTION PHACO AND INTRAOCULAR LENS PLACEMENT (IOC);  Surgeon: Dene Etienne, MD;  Location: [facility];  Service: Ophthalmology;  Laterality: Right;  DIABETIC - insulin  and oral meds   COLONOSCOPY     SHOULDER SURGERY     labrum repair   TONSILLECTOMY     Patient Active Problem List   Diagnosis Date Noted    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) 10/11/2023   High blood pressure 08/13/2023   Anemia 08/13/2023   Constipation 08/13/2023   Myasthenia gravis (HCC) 08/13/2023   Acute ischemic right MCA stroke (HCC) 07/19/2023   Lymphedema 12/27/2016   Leg pain 07/15/2016   Chronic venous insufficiency 07/15/2016   Swelling of limb 07/15/2016   Type 2 diabetes mellitus (HCC) 07/15/2016   Hyperlipidemia 07/15/2016    ONSET DATE: 07/19/23  REFERRING DIAG:  Diagnosis  I63.511 (ICD-10-CM) - Cerebral infarction due to unspecified occlusion or stenosis of right middle cerebral artery    THERAPY DIAG:  Muscle weakness (generalized)  Other lack of coordination  Difficulty in walking, not elsewhere classified  Unsteadiness on feet  Abnormality of gait and mobility  Rationale for Evaluation and Treatment: Rehabilitation  SUBJECTIVE:  SUBJECTIVE STATEMENT:  Pt reports that he had a good weekend. Wife reports that he has been working on taking a few steps at home.  Will have follow up with [facility] next Wednesday 8/20 to assess fit of knee cage and discuss alternatives to reduce GR with gait.   Also reports mild HA at start of PT treatment    PERTINENT HISTORY:  CVA with Left hemiplegia in April 2025. Pt started on a swedish knee cage at [facility] due to uncontrolled hyperextension in stance. Pt had not been moving much since discharging from [facility], as therapists instructed pt to perform transfers only at home due to high fall risk with ambulation. Continues to have significant weakenss in the LUE with difficulty extending fingers as well as numbness and inattention to the L side of body resulting in multiple cuts on Arm and leg with WC mobility in home. Will be attaining  custom power WC from [employer]. Has loaner currently.   PAIN:  Are you having pain? No  PRECAUTIONS: Fall   *Latex allergy   WEIGHT BEARING RESTRICTIONS: No  FALLS: Has patient fallen in last 6 months? Yes. Number of falls 1  LIVING ENVIRONMENT: Lives with: lives with their spouse Lives in: House/apartment Stairs: Ramps installed while in the Hospital  Has following equipment at home: Vannie - 2 wheeled and Wheelchair (power)  PLOF: Independent with basic ADLs, Independent with household mobility without device, and Independent with community mobility with device with [facility]   PATIENT GOALS: relearn to Walk 15-26ft.   OBJECTIVE:  DIAGNOSTIC FINDINGS:   MRI brain done revealing acute right MCA infarction involving right periventricular white matter and extending into right frontal and parietal lobes.  LOWER EXTREMITY MMT:    MMT Right Eval Left Eval  Hip flexion 4+ 4-  Hip extension 4+ 3+  Hip abduction 4+ 3+  Hip adduction 4+ 3+  Knee flexion 5 4-  Knee extension 5 4-  Ankle dorsiflexion 5 4  (Blank rows = not tested)                                                                                                                              TREATMENT DATE: 11/05/2023  Sitting 115/68 HR 66 Standing  129/105 HR 99 Standing 157/70 HR 90  Gait with RW x 34 ft with 2 180deg turns   10 Meter Walk Test: Patient instructed to walk 10 meters (32.8 ft) as quickly and as safely as possible at their normal speed x2 and at a fast speed x2. Time measured from 2 meter mark to 8 meter mark to accommodate ramp-up and ramp-down.   Average Normal speed: 0.176m/s with RW, and L hand splint. No knee cage on this day. m/s  Cut off scores: <0.4 m/s = household Ambulator, 0.4-0.8 m/s = limited community Ambulator, >0.8 m/s = community Ambulator, >1.2 m/s = crossing a street, <1.0 = increased fall risk MCID 0.05 m/s (small), 0.13 m/s (moderate),  0.06 m/s (significant)  (ANPTA Core Set of  Outcome Measures for Adults with Neurologic Conditions, 2018)  Pt performed 5 time sit<>stand (5xSTS):  27.7 sec with RUE pushing from arm rest. (>15 sec indicates increased fall risk)   PT instructed pt in TUG: 1:01 min with RW an dL hand splint.  sec (average of 3 trials; >13.5 sec indicates increased fall risk)  Additional gait in rehab gym with CGA-min assist for safety with RW x 85ft. Min cues for improved awareness of the LLE in turn and AD management as pt prepares to sit chair Was noted to have adduction with the LLE without knee cage with fatigue due to sensory deficits and mild tone with fatigue. Was able to correct with instruction from PT  PATIENT EDUCATION: Education details: knee cage needs assessmnet as it is causeing crossover gait.  Person educated: Patient and Spouse Education method: Explanation, VC/TC/Demo Education comprehension: verbalized understanding  HOME EXERCISE PROGRAM: Access Code: [email or web address] URL: [email or web address] Date: 09/11/2023 Prepared by: Massie Dollar  Exercises - Seated Knee Extension AROM  - 1 x daily - 4 x weekly - 3 sets - 10 reps - Seated Hip Abduction  - 1 x daily - 4 x weekly - 3 sets - 10 reps - Seated March  - 1 x daily - 4 x weekly - 3 sets - 10 reps - Sit to Stand with Counter Support  - 1 x daily - 4 x weekly - 3 sets - 10 reps - Supine Short Arc Quad  - 1 x daily - 4 x weekly - 3 sets - 10 reps - Supine Hip Abduction  - 1 x daily - 4 x weekly - 3 sets - 10 reps - Supine Bridge  - 1 x daily - 4 x weekly - 3 sets - 3 reps - 2sec  hold  GOALS: Goals reviewed with patient? Yes  SHORT TERM GOALS: Target date: 09/25/2023 Patient will be independent in home exercise program to improve strength/mobility for better functional independence with ADLs. Baseline: Initiated on 09/11/2023 10/22/2023: will update when appropriate Goal status: IN PROGRESS  LONG TERM GOALS: Target date: 11/13/2023  Patient will increase SIS-16 score  to equal to or greater than 10points to demonstrate statistically significant improvement in mobility and quality of life.  Baseline: 49/80 09/19/2023: 45/80 10/22/2023: 46/80 Goal status: IN PROGRESS  2.  Patient (> 69 years old) will complete five times sit to stand test in < 15 seconds indicating an increased LE strength and improved balance. Baseline: 1:42min 09/19/2023: 56.39 seconds using R UE support to push-up from armrest of green chair and requiring skilled min A for balance and lifting/lowering  10/22/2023: 27.10 seconds using R UE support to push-up from armrest  8/12: 27.7 sec with RUE pushing from arm rest.  Goal status: IN PROGRESS  3.  Patient will increase FIST score by > 6 points to demonstrate decreased fall risk during functional activities Baseline:  43/56  09/23/2023: 53/56 Goal status: MET and UPGRADED  09/23/2023: Patient will increase Berg Balance score to > 45/56 to demonstrate improved balance and decreased fall risk during functional activities and ADLs.  Baseline: 09/23/2023: 14/56 7/31: 20 Goal status: IN PROGRESS  4.  Patient will increase 10 meter walk test to >0.93m/s as to improve gait speed for better community ambulation and to reduce fall risk. Baseline: 09/09/2023: 0.103 m/s ( and 37 seconds) using bari-RW with skilled heavy min A and +2 w/c follow for safety  09/19/2023: 0.67m/s using bari-RW  with skilled min A of 1 and +2 w/c follow for safety 10/22/2023: 0.1875 m/s using bari-RW with L hand splint, L swedish knee cage, and only skilled light min A of 1 (no wheelchair follow)  8/12: 0.161m/s with RW, and L hand splint. No knee cage on this day.  Goal status: IN PROGRESS  5.  Patient will reduce timed up and go to <11 seconds to reduce fall risk and demonstrate improved transfer/gait ability. Baseline: unable to complete turn at 10 ft. Mod-max assist due to poor control of LW on RW, resulting in L lateral LOB  09/09/2023: and 32 seconds using bari-RW  with L hand splint and +2 on R side for safety, therapist providing skilled min A on L side (Had pt set-up L hand on RW splint, get L foot in proper positioning, and scoot forward in seat before starting the timer) 09/19/2023: 33min43 seconds using bari-RW with only skilled min A of 1 (Had pt set-up L hand on RW splint, get L foot in proper positioning, and scoot forward in seat before starting the timer) 7/2: 78sec(1:18.125) with RW and min assist overall.  10/22/2023: 1 min, 30 seconds using bari-RW with L hand splint and skilled light min assist  8/12: 1:01 min with RW an dL hand splint.  Goal status: IN PROGRESS   ASSESSMENT:  CLINICAL IMPRESSION:     PT instructed pt in goal re-assessment for re-certification. Pt demonstrated improved TUG from 1:28min at last assessment to 1:62min with RW and hand splint on this day. Sustained improvement in 5x STS from 56 sec in June to 27 sec with RW and arm rest to push to standing  for the last 2 assessments. Gait speed on slightly improved to 0.190m/s compared last assessment on 7/29. Patient's condition has the potential to improve in response to therapy. Maximum improvement is yet to be obtained. The anticipated improvement is attainable and reasonable in a generally predictable time. Patient will benefit from skilled physical therapy intervention to reduce deficits and impairments identified in evaluation, in order to reduce pain, improve quality of life, and maximize activity tolerance for ADL, IADL, and leisure/fitness. Physical therapy will help pt achieve long and short term goals of care.    OBJECTIVE IMPAIRMENTS: Abnormal gait, cardiopulmonary status limiting activity, decreased activity tolerance, decreased balance, decreased cognition, decreased coordination, decreased endurance, decreased knowledge of condition, decreased knowledge of use of DME, decreased mobility, difficulty walking, decreased ROM, decreased strength, decreased safety  awareness, dizziness, hypomobility, increased fascial restrictions, impaired perceived functional ability, increased muscle spasms, impaired sensation, impaired tone, impaired UE functional use, impaired vision/preception, improper body mechanics, postural dysfunction, and obesity.   ACTIVITY LIMITATIONS: carrying, lifting, bending, sitting, standing, squatting, stairs, transfers, bed mobility, bathing, toileting, dressing, reach over head, hygiene/grooming, locomotion level, and caring for others  PARTICIPATION LIMITATIONS: meal prep, cleaning, laundry, medication management, interpersonal relationship, driving, shopping, community activity, and yard work  PERSONAL FACTORS: Age, Past/current experiences, Time since onset of injury/illness/exacerbation, and 3+ comorbidities: MG, HTN, CVA, lymphedema are also affecting patient's functional outcome.   REHAB POTENTIAL: Good  CLINICAL DECISION MAKING: Unstable/unpredictable  EVALUATION COMPLEXITY: High  PLAN:  PT FREQUENCY: 1-2x/week  PT DURATION: 8 weeks  PLANNED INTERVENTIONS: 97164- PT Re-evaluation, 97750- Physical Performance Testing, 97110-Therapeutic exercises, 97530- Therapeutic activity, [id number]- Neuromuscular re-education, 97535- Self Care, [id number]- Manual therapy, [id number]- Gait training, [id number]- Orthotic Initial, [id number]- Orthotic/Prosthetic subsequent, [phone]- Canalith repositioning, H9716- Electrical stimulation (unattended), [id number]- Electrical stimulation (manual), [id number]- Wound care (first 20 sq cm),  02401- Wound care (each additional 20 sq cm), Patient/Family education, Balance training, Stair training, Taping, Dry Needling, Joint mobilization, Joint manipulation, Vestibular training, Visual/preceptual remediation/compensation, Cognitive remediation, DME instructions, Wheelchair mobility training, Cryotherapy, and Moist heat  PLAN FOR NEXT SESSION:  Gait training with L hand splint on bari-RW  Variable gait and improved weight shift over the  RLE.  Family education for gait in simulated home environment.    Massie Dollar PT, DPT  Physical Therapist - [facility]  11:28 AM 11/05/23

## 2023-11-06 NOTE — Therapy (Signed)
 Occupational Therapy Neuro Treatment Note  Patient Name: [name]. MRN: [id number] DOB:1950/09/16, 73 y.o., male Today's Date: 11/06/2023  PCP: Alm Rower, MD REFERRING PROVIDER: Hermann Toribio PARAS, PA-C  END OF SESSION:  OT End of Session - 11/06/23 2011     Visit Number 22    Number of Visits 48    Date for OT Re-Evaluation 01/21/24    OT Start Time 0845    OT Stop Time 0930    OT Time Calculation (min) 45 min    Activity Tolerance Patient tolerated treatment well    Behavior During Therapy [facility] for tasks assessed/performed         Past Medical History:  Diagnosis Date   Anemia    Cervical myelopathy (HCC)    Chronic venous insufficiency    [name] (chronic kidney disease), stage III (HCC)    Diabetes mellitus without complication (HCC)    GERD (gastroesophageal reflux disease)    Hypercholesteremia    Hypothyroidism    Kidney stones    about 50 in the past--last in March 4/24   Leg weakness, bilateral    due to Myasthenia Gravis   Myasthenia gravis (HCC)    Spinal stenosis    Vitamin B 12 deficiency    Vitamin B 12 deficiency    Wears hearing aid    bilateral   Past Surgical History:  Procedure Laterality Date   BACK SURGERY  09/2015   CARDIAC CATHETERIZATION     CATARACT EXTRACTION W/ INTRAOCULAR LENS IMPLANT     CATARACT EXTRACTION W/PHACO Right 12/21/2015   Procedure: CATARACT EXTRACTION PHACO AND INTRAOCULAR LENS PLACEMENT (IOC);  Surgeon: Dene Etienne, MD;  Location: [facility];  Service: Ophthalmology;  Laterality: Right;  DIABETIC - insulin  and oral meds   COLONOSCOPY     SHOULDER SURGERY     labrum repair   TONSILLECTOMY     Patient Active Problem List   Diagnosis Date Noted   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) 10/11/2023   High blood pressure 08/13/2023   Anemia 08/13/2023   Constipation 08/13/2023   Myasthenia gravis (HCC) 08/13/2023   Acute ischemic right MCA stroke  (HCC) 07/19/2023   Lymphedema 12/27/2016   Leg pain 07/15/2016   Chronic venous insufficiency 07/15/2016   Swelling of limb 07/15/2016   Type 2 diabetes mellitus (HCC) 07/15/2016   Hyperlipidemia 07/15/2016   ONSET DATE: 07/13/2023  REFERRING DIAG: Acute ischemic R MCA CVA  THERAPY DIAG: muscle weakness (generalized), other lack of coordination, vision disturbance, hemiplegia and hemiparesis following cerebral infarction affecting left non-dominant side (HCC)  Rationale for Evaluation and Treatment: Rehabilitation  SUBJECTIVE:  SUBJECTIVE STATEMENT: Pt agreeable to Estim trial in clinic after Saebo unit was unsuccessful.  Pt accompanied by: significant other  PERTINENT HISTORY: Pt. Was admitted to [facility] on 07/13/2023 after sustaining a CVA while on a [place] to the beach. Pt. Was diagnosed with R MCA CVA. Pt. Was admitted to inpatient rehab from 07/19/2023-08/13/2023. Past medical history includes high BP, anemia, and Myasthenia Gravis.   PRECAUTIONS: None  WEIGHT BEARING RESTRICTIONS: No  PAIN:   11/05/23: mild pain in L hand digits 10/31/23: No pain, however reports  discomfort in the left forearm with 4th, and 5th digit MP, and PIP extension.  10/29/23: No report of pain 10/24/2023: No pain during treatment session, pt. Reported having pain in L dorsal forearm when using walker splint at home.  10/22/2023: No pain. 10/14/2023: No pain 10/07/23: 4/10  pain L shoulder Are you having pain?  10/01/2023- Yes, 7/10 in L shoulder due to most recent fall that occurred this past Friday. 5/10 L wrist  FALLS: Has patient fallen in last 6 months? Yes  LIVING ENVIRONMENT: Lives with: lives with their family and lives with their spouse-  Lives in: House/apartment- 2 story home and resides mostly on the 1st floor Stairs: No- Ramped entrance Has following equipment at home:  Vannie, cane, Steadi, shower chair, handheld shower head, bedside commode  PLOF: Independent and Independent with basic  ADLs Independent at home   PATIENT GOALS: Would like to walk 10-20 feet each time.   OBJECTIVE:  Note: Objective measures were completed at Evaluation unless otherwise noted.  HAND DOMINANCE: Right  ADLs: Overall ADLs:  Transfers/ambulation related to ADLs: Eating: Independent, 100% R handed Grooming: independent UB Dressing: Can put on shirt independently LB Dressing: Difficulty with pants and requires assistance getting the pants on his feet, has difficulty putting on shoes and socks Toileting: Tot A for toilet hygiene. Uses Steadi during toilet transfers. Bathing: Requires assist with using the L arm to wash the R arm. Tub Shower transfers: Roll in shower, built in shower chair, grab bars and hand held shower head.  IADLs: Shopping: Total A (Wife typically did the shopping prior to onset)   Light housekeeping: Total A (wife typically performs prior to onset) Meal Prep: Independent with light snack prep Community mobility: No driving, able to get in/out of the car Medication management: Set up assistance from his wife using a pill box (pt. Is responsible for taking medications at the correct time) Financial management: family members check behind him. Handwriting: TBD Hobbies: gardening, wood working and sports- [employer] Work history: Owns a tax business MOBILITY STATUS: Needs Assist:    POSTURE COMMENTS:   Sitting balance: Good supported sitting balance  FUNCTIONAL OUTCOME MEASURES: TBD   UPPER EXTREMITY ROM:    Active ROM Right Eval  [facility] Right 09/23/2023 [facility] Left eval Left 09/23/2023 Left 10/29/23  Shoulder flexion   89(110) 109(114) 111(115)  Shoulder abduction   98(120) 109(120) 111(122)  Shoulder adduction       Shoulder extension       Shoulder internal rotation       Shoulder external rotation       Elbow flexion   120(140) 130(136) 141(145)  Elbow extension   -28(-10) -5(0) -18(0)  Wrist flexion   60(60) Rest at 60 50(65) 54(60)  Wrist extension    -48(42) 10(40) 20(50)  Wrist ulnar deviation     12(22)  Wrist radial deviation     10(20)  Wrist pronation     (82)90  Wrist supination     64(74)  (Blank rows = not tested)  Eval: L Digit flexion: 2nd:3cm (0cm) 3rd: 0cm (0cm), 4th: 0cm (0cm), 5th: 2cm (0cm)  09/23/23:  L Digit flexion to [facility]: 2nd: 3.5cm (0cm), 3rd: 4 cm(0cm), 4th 3.5cm (0cm), 5th: 2cm (0cm)  Eval:  L Digit extension: Active Gross digit extension through 10% of ROM  09/23/23:  L Digit extension: Active Gross digit extension through 50% of ROM  10/29/23:  L Digit extension: Active Gross digit extension through 25-50% of ROM    UPPER EXTREMITY MMT:     MMT Right eval Left eval Left 09/23/2023 Left 10/29/23  Shoulder flexion 5 3- 3-/5 3-/5  Shoulder abduction 5 3- 3-/5 3-/5  Shoulder adduction      Shoulder extension      Shoulder internal rotation  Shoulder external rotation      Middle trapezius      Lower trapezius      Elbow flexion 5 TBD 3+/5 4-/5  Elbow extension 5 TBD 4-/5 3-/5  Wrist flexion   2/5 3-/5  Wrist extension  2- 2/5 2+/5  Wrist ulnar deviation      Wrist radial deviation      Wrist pronation    3/5  Wrist supination    3-/5  (Blank rows = not tested)  HAND FUNCTION:   Eval:  Grip strength: Right: 94#, Left: 1#,   Lateral Key Pinch strength: Right: 14#, Left: 3#  10/29/23:   Grip strength: Left: 16#  Lateral Key Pinch strength: Left: 5#  COORDINATION:  Box and Blocks: Right: 34 blocks completed in 1 min., Left: 0 blocks completed in 1 min.    10/29/23:  Box and Blocks: Right: 34 blocks completed in 1 min., Left: 2 blocks completed in 1 min.  9 Hole Peg Test:  Left: To remove 9 pegs from a vertical position: 1 min. &13 sec.  SENSATION: Sensation feels different in both hands.  L hand Light touch- impaired L hand Proprioception- impaired  EDEMA: Has had hx of edema, and came to Eval session with swelling in the L hand/wrist/forearm.   *pt. Has a  laceration from his dog on the dorsal aspect of the L hand.   MUSCLE TONE: Increased tone through the UE and hand flexors.   COGNITION: Overall cognitive status: Within functional limits for tasks assessed  VISION: Subjective report: L sided inattention Baseline vision: Wears glasses for reading only Visual history: Right visual occlusion  VISION ASSESSMENT: To be assessed  PERCEPTION: TBD  PRAXIS: Impaired                                                                                                                 TREATMENT DATE: 11/05/2023 Estim attended: Tx time: 10 min, 37 mA CC, duty cycle 50%, ramp 5 sec, cycle time 10/10; electrodes placed at proximal and distal forearm, dorsal aspect, to promote wrist and digit extension during targeted grasping and reaching tasks; good tolerance.  Therapeutic Exercise: -Review of distal LUE self ROM d/t flexor tightness in the hand and mild discomfort with PIP and MP ext.  Spouse present for instruction and demo and both pt and spouse able to return demo of passive MP and PIP extension to pt tolerance in all digits of L hand.   Neuro re-ed: -Promoted LUE GMC/FMC tasks with grasp/release of ball pegs.  Increased challenge by incorporating pron/sup, forward flexion, abd, and ER into grasping and releasing pegs.   PATIENT EDUCATION: Education details:  Estim Person educated: Patient and spouse Education method: Explanation and Verbal cues Education comprehension: verbalized understanding and needs further education  HOME EXERCISE PROGRAM: -grasping/releasing tennis ball sized circular sphere, holding while performing wrist extension -Repetitions for grasping/releasing 1 blocks.  GOALS: Goals reviewed with patient? Yes  SHORT TERM GOALS: Target date:.   Pt. Will be independent with HEP  for the LUE.  Baseline: 10/29/23: Pt. requires assist from wife with HEPs. Pt. Is consistently working on HEP's at home. 09/19/23: Requires assist  from his wife Eval; no current HEP Goal status: Ongoing  LONG TERM GOALS: Target date: 01/21/2024  Pt. Will increase L shoulder flexion by 10 degrees to be able to reach up to shelves.  Baseline: 10/29/23: 888(884) 09/19/23: Pt. Continues to be limited with reaching up to shelves. Eval: L shoulder flexion is 89 (110). Goal status: Ongoing  2.  Pt. Will increase L shoulder abduction by 10 degrees to assist with underarm hygiene.  Baseline:10/29/23: 888(877) 09/19/23: Pt. Continues to require assist with underarm hygiene.Eval: L shoulder abduction is 98 (120).  Goal status: Ongoing  3.  Pt. Will improve L wrist extension by 10 degrees to be able to initiate anticipation of grasping for objects.  Baseline: 10/29/23: 20(55) 09/19/23: Consistent activation noted in the left wrist extensors with facilitation. Eval: -48 (42) Goal status: Ongoing  4.  Pt. Will improve active gross digit extension through 50% of the range to be able to release objects in his hand consistently.  Baseline: 10/28/20: Gross digit extension through 25-50% of the range. 09/19/23: gross digit extension noted through 25% range with facilitation.Eval: gross digit extension is 10% of the range  Goal status: Goal updated to 50% of the range  5.  Pt. Will independently demonstrate visual compensatory strategies when navigating through environment and during tabletop tasks. Baseline: 10/29/2023: Pt. Continues to less cuing for left sided awareness. 09/19/23: Pt. requires fewer cues for left sided awareness Eval: Pt. Requires consistent cuing for L sided awareness.  Goal status: Ongoing   6. Pt. Will improve left hand [facility] skills to be able to manipulate small objects during ADLs, and IADLs.   Baseline: 10/29/23: 9 Hole Peg Test: Pt. Was able to remove 9 pegs from vertical position on the pegboard in 1 min. & 13 sec. Pt. Is not yet able to pick up pegs from a horizontal position to place them in the pegboard.   Goal status: New   7. Pt.  Will improve left hand function skills as evidence by improve score to 4 blocks on the Blocks, and Box test.   Baseline: 10/29/23: Box and Blocks Test: 2 Blocks completed in 1 min.   Goal Status: New  ASSESSMENT:  CLINICAL IMPRESSION: Good tolerance and effective stimulation with Estim to promote L wrist and digit extension within parameters noted above.  Pt's max active extension paired with Estim still not full extension d/t flexor stiffness in the MP and PIPs of L hand.  OT reviewed self passive digit stretching techniques to isolate each digit at the MP and PIP joints with good return demo.  Pt would continue to benefit from home Estim unit, but as recent Saebo unit was unsuccessful, OT encouraged pt to wait for clinic to get a Saebo demo unit to trial in clinic, which is anticipated.  Pt and spouse both receptive to this.  Pt worked to [employer] pegs this date with pronated and supinated forearm, requiring OT to intermittently stabilize peg in pegboard to prevent peg rolling, or placement of peg in hand; supv-min A for either.   Pt. continues to present with intermittent mild discomfort in the left volar forearm with 4th and 5th digit MP extension.  Pt reports forearm is still painful when attempting to use the walker hand attachment splint.  Pt continues to benefit from OT services to work on improving LUE functioning to  increase engagement of the LUE during daily task, and to provide education about compensatory strategies during ADLs/IADLs.  PERFORMANCE DEFICITS: in functional skills including ADLs, IADLs, coordination, dexterity, proprioception, sensation, edema, tone, ROM, strength, pain, Fine motor control, Gross motor control, mobility, balance, endurance, vision, and UE functional use, cognitive skills including perception, and psychosocial skills including coping strategies, environmental adaptation, and routines and behaviors.   IMPAIRMENTS: are limiting patient from ADLs, IADLs,  and leisure.   CO-MORBIDITIES: may have co-morbidities  that affects occupational performance. Patient will benefit from skilled OT to address above impairments and improve overall function.  MODIFICATION OR ASSISTANCE TO COMPLETE EVALUATION: Min-Moderate modification of tasks or assist with assess necessary to complete an evaluation.  OT OCCUPATIONAL PROFILE AND HISTORY: Detailed assessment: Review of records and additional review of physical, cognitive, psychosocial history related to current functional performance.  CLINICAL DECISION MAKING: Moderate - several treatment options, min-mod task modification necessary  REHAB POTENTIAL: Good  EVALUATION COMPLEXITY: Moderate  PLAN:  OT FREQUENCY: 2x/week  OT DURATION: 12 weeks  PLANNED INTERVENTIONS: 97168 OT Re-evaluation, 97535 self care/ADL training, 02889 therapeutic exercise, 97530 therapeutic activity, 97112 neuromuscular re-education, 97140 manual therapy, 97018 paraffin, 02989 moist heat, 97010 cryotherapy, 97034 contrast bath, 97760 Orthotic Initial, 97763 Orthotic/Prosthetic subsequent, passive range of motion, visual/perceptual remediation/compensation, energy conservation, and DME and/or AE instructions  RECOMMENDED OTHER SERVICES: PT and ST  CONSULTED AND AGREED WITH PLAN OF CARE: Patient and family member/caregiver  PLAN FOR NEXT SESSION: see above  Inocente Blazing, MS, OTR/L

## 2023-11-07 ENCOUNTER — Ambulatory Visit

## 2023-11-07 ENCOUNTER — Encounter: Admitting: Speech Pathology

## 2023-11-07 DIAGNOSIS — M6281 Muscle weakness (generalized): Secondary | ICD-10-CM

## 2023-11-07 DIAGNOSIS — R262 Difficulty in walking, not elsewhere classified: Secondary | ICD-10-CM

## 2023-11-07 DIAGNOSIS — R278 Other lack of coordination: Secondary | ICD-10-CM

## 2023-11-07 DIAGNOSIS — R269 Unspecified abnormalities of gait and mobility: Secondary | ICD-10-CM

## 2023-11-07 DIAGNOSIS — R2681 Unsteadiness on feet: Secondary | ICD-10-CM

## 2023-11-07 DIAGNOSIS — I69354 Hemiplegia and hemiparesis following cerebral infarction affecting left non-dominant side: Secondary | ICD-10-CM

## 2023-11-07 NOTE — Therapy (Signed)
 OUTPATIENT PHYSICAL THERAPY TREATMENT  Patient Name: [name]. MRN: [id number] DOB:[birth year], 73 y.o., male Today's Date: 11/07/2023 PCP: Dennise Remak, MD  REFERRING PROVIDER:    Pegge Toribio PARAS, PA-C    END OF SESSION:    PT End of Session - 11/07/23 1324     Visit Number 25    Number of Visits 40    Date for PT Re-Evaluation 12/31/23    Authorization Type [employer]    Authorization Time Period 07/31/23-11/12/23 (25 visits)    Authorization - Visit Number 25    Authorization - Number of Visits 25    Progress Note Due on Visit 30    PT Start Time 1100    PT Stop Time 1145    PT Time Calculation (min) 45 min    Equipment Utilized During Treatment Gait belt    Activity Tolerance Patient tolerated treatment well;No increased pain    Behavior During Therapy WFL for tasks assessed/performed           Past Medical History:  Diagnosis Date   Anemia    Cervical myelopathy (HCC)    Chronic venous insufficiency    [name] (chronic kidney disease), stage III (HCC)    Diabetes mellitus without complication (HCC)    GERD (gastroesophageal reflux disease)    Hypercholesteremia    Hypothyroidism    Kidney stones    about 50 in the past--last in March 4/24   Leg weakness, bilateral    due to Myasthenia Gravis   Myasthenia gravis (HCC)    Spinal stenosis    Vitamin B 12 deficiency    Vitamin B 12 deficiency    Wears hearing aid    bilateral   Past Surgical History:  Procedure Laterality Date   BACK SURGERY  09/2015   CARDIAC CATHETERIZATION     CATARACT EXTRACTION W/ INTRAOCULAR LENS IMPLANT     CATARACT EXTRACTION W/PHACO Right 12/21/2015   Procedure: CATARACT EXTRACTION PHACO AND INTRAOCULAR LENS PLACEMENT (IOC);  Surgeon: Dene Etienne, MD;  Location: [facility];  Service: Ophthalmology;  Laterality: Right;  DIABETIC - insulin  and oral meds   COLONOSCOPY     SHOULDER SURGERY     labrum repair   TONSILLECTOMY     Patient Active Problem  List   Diagnosis Date Noted   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) 10/11/2023   High blood pressure 08/13/2023   Anemia 08/13/2023   Constipation 08/13/2023   Myasthenia gravis (HCC) 08/13/2023   Acute ischemic right MCA stroke (HCC) 07/19/2023   Lymphedema 12/27/2016   Leg pain 07/15/2016   Chronic venous insufficiency 07/15/2016   Swelling of limb 07/15/2016   Type 2 diabetes mellitus (HCC) 07/15/2016   Hyperlipidemia 07/15/2016    ONSET DATE: 07/19/23  REFERRING DIAG:  Diagnosis  I63.511 (ICD-10-CM) - Cerebral infarction due to unspecified occlusion or stenosis of right middle cerebral artery    THERAPY DIAG:  Muscle weakness (generalized)  Difficulty in walking, not elsewhere classified  Unsteadiness on feet  Abnormality of gait and mobility  Rationale for Evaluation and Treatment: Rehabilitation  SUBJECTIVE:  SUBJECTIVE STATEMENT:  Pt doing well. Says he successfully was able to practice walking in the house twice since last session. DTR provided WC follow.   PERTINENT HISTORY:  CVA with Left hemiplegia in April 2025. Pt started on a swedish knee cage at [facility] due to uncontrolled hyperextension in stance. Pt had not been moving much since discharging from [facility], as therapists instructed pt to perform transfers only at home due to high fall risk with ambulation. Continues to have significant weakenss in the LUE with difficulty extending fingers as well as numbness and inattention to the L side of body resulting in multiple cuts on Arm and leg with WC mobility in home. Will be attaining custom power WC from [employer]. Has loaner currently.   PAIN:  Are you having pain? No  PRECAUTIONS: Fall, history of wounds on LLE   *Latex allergy   WEIGHT  BEARING RESTRICTIONS: No  FALLS: Has patient fallen in last 6 months? Yes. Number of falls 1  LIVING ENVIRONMENT: Lives with: lives with their spouse Lives in: House/apartment Stairs: Ramps installed while in the Hospital  Has following equipment at home: Vannie - 2 wheeled and Wheelchair (power)  PLOF: Independent with basic ADLs, Independent with household mobility without device, and Independent with community mobility with device with [facility]   PATIENT GOALS: relearn to Walk 15-62ft.   OBJECTIVE:                                                                                                                              TREATMENT DATE: 11/07/2023 -modA transfers from transport chair to plinth: Left UE walker hand splint, BRW, ModA of LLE -donning of Left hinged knee brace (set to 10 degree extension limit)  -STS from elevated plinth to BRW with LUE grip splint (MinA), forward AMB 25ft at minGuard ot intermittent minA for scissoring LOB, 180 degree turnaround and return to sitting.  *60-90 sec recovery interval seated during brace adjustment -STS from elevated plinth to BRW with LUE grip splint (MinA), forward AMB 25ft at minGuard ot intermittent minA for scissoring LOB, 180 degree turnaround and return to sitting.  *60-90 sec recovery interval seated during brace adjustment -STS from elevated plinth to BRW with LUE grip splint (MinA), forward AMB 25ft at minGuard ot intermittent minA for scissoring LOB, 180 degree turnaround and return to sitting.  *60-90 sec recovery interval seated during brace adjustment -STS from elevated plinth to BRW with LUE grip splint (MinA), forward AMB 25ft at minGuard ot intermittent minA for scissoring LOB, 180 degree turnaround and return to sitting.  *60-90 sec recovery interval seated during brace adjustment -STS from elevated plinth to BRW with LUE grip splint (MinA), forward AMB 25ft at minGuard ot intermittent minA for scissoring LOB, 180 degree  turnaroundforward AMB 25ft at minGuard ot intermittent minA for scissoring LOB, (54ft total)  Min-modA transfers back to transport chair and doffing hinged knee brace   *hinged knee brace slides down leg  nearly each time, but is improved with use of pre-wrapping limb with latex free YTB to increase grip of brace on leg. Requires readjustment several times in session.    PATIENT EDUCATION: Education details: knee cage needs assessmnet as it is causeing crossover gait.  Person educated: Patient and Spouse Education method: Explanation, VC/TC/Demo Education comprehension: verbalized understanding  HOME EXERCISE PROGRAM: Access Code: [email or web address] URL: [email or web address] Date: 09/11/2023 Prepared by: Massie Dollar  Exercises - Seated Knee Extension AROM  - 1 x daily - 4 x weekly - 3 sets - 10 reps - Seated Hip Abduction  - 1 x daily - 4 x weekly - 3 sets - 10 reps - Seated March  - 1 x daily - 4 x weekly - 3 sets - 10 reps - Sit to Stand with Counter Support  - 1 x daily - 4 x weekly - 3 sets - 10 reps - Supine Short Arc Quad  - 1 x daily - 4 x weekly - 3 sets - 10 reps - Supine Hip Abduction  - 1 x daily - 4 x weekly - 3 sets - 10 reps - Supine Bridge  - 1 x daily - 4 x weekly - 3 sets - 3 reps - 2sec  hold  GOALS: Goals reviewed with patient? Yes  SHORT TERM GOALS: Target date: 09/25/2023 Patient will be independent in home exercise program to improve strength/mobility for better functional independence with ADLs. Baseline: Initiated on 09/11/2023 10/22/2023: will update when appropriate Goal status: IN PROGRESS  LONG TERM GOALS: Target date: 11/13/2023  Patient will increase SIS-16 score to equal to or greater than 10points to demonstrate statistically significant improvement in mobility and quality of life.  Baseline: 49/80 09/19/2023: 45/80 10/22/2023: 46/80 Goal status: IN PROGRESS  2.  Patient (> 31 years old) will complete five times sit to stand test in < 15  seconds indicating an increased LE strength and improved balance. Baseline: 1:39min 09/19/2023: 56.39 seconds using R UE support to push-up from armrest of green chair and requiring skilled min A for balance and lifting/lowering  10/22/2023: 27.10 seconds using R UE support to push-up from armrest  8/12: 27.7 sec with RUE pushing from arm rest.  Goal status: IN PROGRESS  3.  Patient will increase FIST score by > 6 points to demonstrate decreased fall risk during functional activities Baseline:  43/56  09/23/2023: 53/56 Goal status: MET and UPGRADED  09/23/2023: Patient will increase Berg Balance score to > 45/56 to demonstrate improved balance and decreased fall risk during functional activities and ADLs.  Baseline: 09/23/2023: 14/56 7/31: 20 Goal status: IN PROGRESS  4.  Patient will increase 10 meter walk test to >0.37m/s as to improve gait speed for better community ambulation and to reduce fall risk. Baseline: 09/09/2023: 0.103 m/s ( and 37 seconds) using bari-RW with skilled heavy min A and +2 w/c follow for safety  09/19/2023: 0.79m/s using bari-RW with skilled min A of 1 and +2 w/c follow for safety 10/22/2023: 0.1875 m/s using bari-RW with L hand splint, L swedish knee cage, and only skilled light min A of 1 (no wheelchair follow)  8/12: 0.166m/s with RW, and L hand splint. No knee cage on this day.  Goal status: IN PROGRESS  5.  Patient will reduce timed up and go to <11 seconds to reduce fall risk and demonstrate improved transfer/gait ability. Baseline: unable to complete turn at 10 ft. Mod-max assist due to poor control of LW on RW, resulting in L lateral  LOB  09/09/2023: and 32 seconds using bari-RW with L hand splint and +2 on R side for safety, therapist providing skilled min A on L side (Had pt set-up L hand on RW splint, get L foot in proper positioning, and scoot forward in seat before starting the timer) 09/19/2023: 27min43 seconds using bari-RW with only skilled min A of 1  (Had pt set-up L hand on RW splint, get L foot in proper positioning, and scoot forward in seat before starting the timer) 7/2: 78sec(1:18.125) with RW and min assist overall.  10/22/2023: 1 min, 30 seconds using bari-RW with L hand splint and skilled light min assist  8/12: 1:01 min with RW an dL hand splint.  Goal status: IN PROGRESS   ASSESSMENT:  CLINICAL IMPRESSION:    Conitnued with overground mobility training, attempted to increase overall volume of AMB in session. Still using loading locking knee brace until pt [facility]. Still using Left fixed hand splint grip on BRW. Pt needs minA for managing LUE on RW, min-modA of LLE for more complex transfers in session, minA trunk stabilization when STS from lower surfaces. Knee brace has good fit, allows for full knee extension in gait cycle, still able to visualize snapping of knee extension, however brace prevents recurvatum as desired ay Chartered loss adjuster. Brace is set to a 10 degree ROM limit today which appears appropriate in gait cycle. Pt has only a few instances of crossover in gait which result in LOB each time, all take place during an attempted LLE step through swingphase combined with a purposeful deviation of line of progress during obstacle avoidance. Brace does not stay in place where donned, hence author takes measures to improve resiliency of fit. Patient will benefit from skilled physical therapy intervention to reduce deficits and impairments identified in evaluation, in order to reduce pain, improve quality of life, and maximize activity tolerance for ADL, IADL, and leisure/fitness. Physical therapy will help pt achieve long and short term goals of care.    OBJECTIVE IMPAIRMENTS: Abnormal gait, cardiopulmonary status limiting activity, decreased activity tolerance, decreased balance, decreased cognition, decreased coordination, decreased endurance, decreased knowledge of condition, decreased knowledge of use of  DME, decreased mobility, difficulty walking, decreased ROM, decreased strength, decreased safety awareness, dizziness, hypomobility, increased fascial restrictions, impaired perceived functional ability, increased muscle spasms, impaired sensation, impaired tone, impaired UE functional use, impaired vision/preception, improper body mechanics, postural dysfunction, and obesity.   ACTIVITY LIMITATIONS: carrying, lifting, bending, sitting, standing, squatting, stairs, transfers, bed mobility, bathing, toileting, dressing, reach over head, hygiene/grooming, locomotion level, and caring for others  PARTICIPATION LIMITATIONS: meal prep, cleaning, laundry, medication management, interpersonal relationship, driving, shopping, community activity, and yard work  PERSONAL FACTORS: Age, Past/current experiences, Time since onset of injury/illness/exacerbation, and 3+ comorbidities: MG, HTN, CVA, lymphedema are also affecting patient's functional outcome.   REHAB POTENTIAL: Good  CLINICAL DECISION MAKING: Unstable/unpredictable  EVALUATION COMPLEXITY: High  PLAN:  PT FREQUENCY: 1-2x/week  PT DURATION: 8 weeks  PLANNED INTERVENTIONS: [id number]- PT Re-evaluation, 97750- Physical Performance Testing, 97110-Therapeutic exercises, 97530- Therapeutic activity, [id number]- Neuromuscular re-education, 97535- Self Care, [id number]- Manual therapy, [id number]- Gait training, [id number]- Orthotic Initial, [id number]- Orthotic/Prosthetic subsequent, [phone]- Canalith repositioning, H9716- Electrical stimulation (unattended), [phone]- Electrical stimulation (manual), [id number]- Wound care (first 20 sq cm), 97598- Wound care (each additional 20 sq cm), Patient/Family education, Balance training, Stair training, Taping, Dry Needling, Joint mobilization, Joint manipulation, Vestibular training, Visual/preceptual remediation/compensation, Cognitive remediation, DME instructions, Wheelchair mobility training, Cryotherapy, and Moist  heat  PLAN FOR NEXT SESSION:   Gait training with L hand splint on bari-RW  Variable gait and improved weight shift over the RLE.   1:26 PM, 11/07/23 Peggye JAYSON Linear, PT, DPT Physical Therapist - [facility]  Outpatient Physical Therapy- Main Campus [phone]

## 2023-11-10 NOTE — Therapy (Signed)
 Occupational Therapy Neuro Treatment Note  Patient Name: [name]. MRN: [id number] DOB:Jan 17, 1951, 73 y.o., male Today's Date: 11/10/2023  PCP: Alm Rower, MD REFERRING PROVIDER: Hermann Toribio PARAS, PA-C  END OF SESSION:  OT End of Session - 11/07/23       Visit Number 23     Number of Visits 48     Date for OT Re-Evaluation 01/21/24     OT Start Time 1145 am    OT Stop Time 1230 pm    OT Time Calculation (min) 45 min     Activity Tolerance Patient tolerated treatment well     Behavior During Therapy [facility] for tasks assessed/performed     Past Medical History:  Diagnosis Date   Anemia    Cervical myelopathy (HCC)    Chronic venous insufficiency    [name] (chronic kidney disease), stage III (HCC)    Diabetes mellitus without complication (HCC)    GERD (gastroesophageal reflux disease)    Hypercholesteremia    Hypothyroidism    Kidney stones    about 50 in the past--last in March 4/24   Leg weakness, bilateral    due to Myasthenia Gravis   Myasthenia gravis (HCC)    Spinal stenosis    Vitamin B 12 deficiency    Vitamin B 12 deficiency    Wears hearing aid    bilateral   Past Surgical History:  Procedure Laterality Date   BACK SURGERY  09/2015   CARDIAC CATHETERIZATION     CATARACT EXTRACTION W/ INTRAOCULAR LENS IMPLANT     CATARACT EXTRACTION W/PHACO Right 12/21/2015   Procedure: CATARACT EXTRACTION PHACO AND INTRAOCULAR LENS PLACEMENT (IOC);  Surgeon: Dene Etienne, MD;  Location: [facility];  Service: Ophthalmology;  Laterality: Right;  DIABETIC - insulin  and oral meds   COLONOSCOPY     SHOULDER SURGERY     labrum repair   TONSILLECTOMY     Patient Active Problem List   Diagnosis Date Noted   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) 10/11/2023   High blood pressure 08/13/2023   Anemia 08/13/2023   Constipation 08/13/2023   Myasthenia gravis (HCC) 08/13/2023   Acute ischemic right MCA  stroke (HCC) 07/19/2023   Lymphedema 12/27/2016   Leg pain 07/15/2016   Chronic venous insufficiency 07/15/2016   Swelling of limb 07/15/2016   Type 2 diabetes mellitus (HCC) 07/15/2016   Hyperlipidemia 07/15/2016   ONSET DATE: 07/13/2023  REFERRING DIAG: Acute ischemic R MCA CVA  THERAPY DIAG: muscle weakness (generalized), other lack of coordination, vision disturbance, hemiplegia and hemiparesis following cerebral infarction affecting left non-dominant side (HCC)  Rationale for Evaluation and Treatment: Rehabilitation  SUBJECTIVE:  SUBJECTIVE STATEMENT: Pt reports doing well today.   Pt accompanied by: [name]  PERTINENT HISTORY: Pt. Was admitted to [facility] on 07/13/2023 after sustaining a CVA while on a trip to the beach. Pt. Was diagnosed with R MCA CVA. Pt. Was admitted to inpatient rehab from 07/19/2023-08/13/2023. Past medical history includes high BP, anemia, and Myasthenia Gravis.   PRECAUTIONS: None  WEIGHT BEARING RESTRICTIONS: No  PAIN:  11/07/23: mild pain in L hand digits 11/05/23: mild pain in L hand digits 10/31/23: No pain, however reports  discomfort in the left forearm with 4th, and 5th digit MP, and PIP extension.  10/29/23: No report of pain 10/24/2023: No pain during treatment session, pt. Reported having pain in L dorsal forearm when using walker splint at home.  10/22/2023: No pain. 10/14/2023:  No pain 10/07/23: 4/10 pain L shoulder Are you having pain?  10/01/2023- Yes, 7/10 in L shoulder due to most recent fall that occurred this past Friday. 5/10 L wrist  FALLS: Has patient fallen in last 6 months? Yes  LIVING ENVIRONMENT: Lives with: lives with their family and lives with their spouse-  Lives in: House/apartment- 2 story home and resides mostly on the 1st floor Stairs: No- Ramped entrance Has following equipment at home:  Vannie, cane, Steadi, shower chair, handheld shower head, bedside commode  PLOF: Independent and Independent with basic  ADLs Independent at home   PATIENT GOALS: Would like to walk 10-20 feet each time.   OBJECTIVE:  Note: Objective measures were completed at Evaluation unless otherwise noted.  HAND DOMINANCE: Right  ADLs: Overall ADLs:  Transfers/ambulation related to ADLs: Eating: Independent, 100% R handed Grooming: independent UB Dressing: Can put on shirt independently LB Dressing: Difficulty with pants and requires assistance getting the pants on his feet, has difficulty putting on shoes and socks Toileting: Tot A for toilet hygiene. Uses Steadi during toilet transfers. Bathing: Requires assist with using the L arm to wash the R arm. Tub Shower transfers: Roll in shower, built in shower chair, grab bars and hand held shower head.  IADLs: Shopping: Total A (Wife typically did the shopping prior to onset)   Light housekeeping: Total A (wife typically performs prior to onset) Meal Prep: Independent with light snack prep Community mobility: No driving, able to get in/out of the car Medication management: Set up assistance from his wife using a pill box (pt. Is responsible for taking medications at the correct time) Financial management: family members check behind him. Handwriting: TBD Hobbies: gardening, wood working and sports- [employer] Work history: Owns a tax business MOBILITY STATUS: Needs Assist:    POSTURE COMMENTS:   Sitting balance: Good supported sitting balance  FUNCTIONAL OUTCOME MEASURES: TBD  UPPER EXTREMITY ROM:    Active ROM Right Eval  [facility] Right 09/23/2023 [facility] Left eval Left 09/23/2023 Left 10/29/23  Shoulder flexion   89(110) 109(114) 111(115)  Shoulder abduction   98(120) 109(120) 111(122)  Shoulder adduction       Shoulder extension       Shoulder internal rotation       Shoulder external rotation       Elbow flexion   120(140) 130(136) 141(145)  Elbow extension   -28(-10) -5(0) -18(0)  Wrist flexion   60(60) Rest at 60 50(65) 54(60)  Wrist extension   -48(42)  10(40) 20(50)  Wrist ulnar deviation     12(22)  Wrist radial deviation     10(20)  Wrist pronation     (82)90  Wrist supination     64(74)  (Blank rows = not tested)  Eval: L Digit flexion: 2nd:3cm (0cm) 3rd: 0cm (0cm), 4th: 0cm (0cm), 5th: 2cm (0cm)  09/23/23:  L Digit flexion to [facility]: 2nd: 3.5cm (0cm), 3rd: 4 cm(0cm), 4th 3.5cm (0cm), 5th: 2cm (0cm)  Eval:  L Digit extension: Active Gross digit extension through 10% of ROM  09/23/23:  L Digit extension: Active Gross digit extension through 50% of ROM  10/29/23:  L Digit extension: Active Gross digit extension through 25-50% of ROM  UPPER EXTREMITY MMT:     MMT Right eval Left eval Left 09/23/2023 Left 10/29/23  Shoulder flexion 5 3- 3-/5 3-/5  Shoulder abduction 5 3- 3-/5 3-/5  Shoulder adduction      Shoulder extension      Shoulder internal rotation  Shoulder external rotation      Middle trapezius      Lower trapezius      Elbow flexion 5 TBD 3+/5 4-/5  Elbow extension 5 TBD 4-/5 3-/5  Wrist flexion   2/5 3-/5  Wrist extension  2- 2/5 2+/5  Wrist ulnar deviation      Wrist radial deviation      Wrist pronation    3/5  Wrist supination    3-/5  (Blank rows = not tested)  HAND FUNCTION:  Eval:  Grip strength: Right: 94#, Left: 1#,   Lateral Key Pinch strength: Right: 14#, Left: 3#  10/29/23:   Grip strength: Left: 16#  Lateral Key Pinch strength: Left: 5#  COORDINATION:  Box and Blocks: Right: 34 blocks completed in 1 min., Left: 0 blocks completed in 1 min.    10/29/23:  Box and Blocks: Right: 34 blocks completed in 1 min., Left: 2 blocks completed in 1 min.  9 Hole Peg Test:  Left: To remove 9 pegs from a vertical position: 1 min. &13 sec.  SENSATION: Sensation feels different in both hands.  L hand Light touch- impaired L hand Proprioception- impaired  EDEMA: Has had hx of edema, and came to Eval session with swelling in the L hand/wrist/forearm.   *pt. Has a laceration from his  dog on the dorsal aspect of the L hand.   MUSCLE TONE: Increased tone through the UE and hand flexors.   COGNITION: Overall cognitive status: Within functional limits for tasks assessed  VISION: Subjective report: L sided inattention Baseline vision: Wears glasses for reading only Visual history: Right visual occlusion  VISION ASSESSMENT: To be assessed  PERCEPTION: TBD  PRAXIS: Impaired                                                                                                                 TREATMENT DATE: 11/07/2023 Self Care: -Condition management education provided related to Vivistim as a potential tx option for pt once pt reaches chronic phase of stroke recovery (pt currently ~4 mo post CVA, indicated for >6 mo post ischemic stroke).  Issued Vivistim pamphlet and provided education on various pt criteria and VNS rehab.  Therapeutic Activity: -With deck of cards, facilitated LUE forward reaching, grasp/release, pron/sup, shoulder ER and abd, working to improve engagement of LUE into daily tasks.  1 card was placed in pt's hand at a time, and pt was given vc to initiate above noted movements and release card into OT 's hand within above movement planes.   Therapeutic Exercise: -Pt inquired about using 1-2# weight for LUE proximal strengthening in the home.  OT assessed pt's ability to manage a 1# weight for controlled proximal strengthening, but pt not yet able to maintain control.   -OT reviewed proximal strengthening with active and AAROM at the shoulder, elbow, wrist, and hand with gravity only/no dumbbell resistance, and reassured pt that exercises and activities chosen thus far during OT sessions are targeting strengthening throughout the LUE, and advised that use  of 1# weight unsupervised by therapist could lead to injury in the shoulder and wrist d/t observed inability to control weight through desired movements against gravity.  Resistance will be progressed as tolerated.       PATIENT EDUCATION: Education details:  Vivistim, HEP Person educated: Patient and spouse Education method: Explanation and Verbal cues Education comprehension: verbalized understanding and needs further education  HOME EXERCISE PROGRAM: -grasping/releasing tennis ball sized circular sphere, holding while performing wrist extension -Repetitions for grasping/releasing 1 blocks.  GOALS: Goals reviewed with patient? Yes  SHORT TERM GOALS: Target date:.   Pt. Will be independent with HEP for the LUE.  Baseline: 10/29/23: Pt. requires assist from wife with HEPs. Pt. Is consistently working on HEP's at home. 09/19/23: Requires assist from his wife Eval; no current HEP Goal status: Ongoing  LONG TERM GOALS: Target date: 01/21/2024  Pt. Will increase L shoulder flexion by 10 degrees to be able to reach up to shelves.  Baseline: 10/29/23: 888(884) 09/19/23: Pt. Continues to be limited with reaching up to shelves. Eval: L shoulder flexion is 89 (110). Goal status: Ongoing  2.  Pt. Will increase L shoulder abduction by 10 degrees to assist with underarm hygiene.  Baseline:10/29/23: 888(877) 09/19/23: Pt. Continues to require assist with underarm hygiene.Eval: L shoulder abduction is 98 (120).  Goal status: Ongoing  3.  Pt. Will improve L wrist extension by 10 degrees to be able to initiate anticipation of grasping for objects.  Baseline: 10/29/23: 20(55) 09/19/23: Consistent activation noted in the left wrist extensors with facilitation. Eval: -48 (42) Goal status: Ongoing  4.  Pt. Will improve active gross digit extension through 50% of the range to be able to release objects in his hand consistently.  Baseline: 10/28/20: Gross digit extension through 25-50% of the range. 09/19/23: gross digit extension noted through 25% range with facilitation.Eval: gross digit extension is 10% of the range  Goal status: Goal updated to 50% of the range  5.  Pt. Will independently demonstrate visual  compensatory strategies when navigating through environment and during tabletop tasks. Baseline: 10/29/2023: Pt. Continues to less cuing for left sided awareness. 09/19/23: Pt. requires fewer cues for left sided awareness Eval: Pt. Requires consistent cuing for L sided awareness.  Goal status: Ongoing   6. Pt. Will improve left hand [facility] skills to be able to manipulate small objects during ADLs, and IADLs.   Baseline: 10/29/23: 9 Hole Peg Test: Pt. Was able to remove 9 pegs from vertical position on the pegboard in 1 min. & 13 sec. Pt. Is not yet able to pick up pegs from a horizontal position to place them in the pegboard.   Goal status: New   7. Pt. Will improve left hand function skills as evidence by improve score to 4 blocks on the Blocks, and Box test.   Baseline: 10/29/23: Box and Blocks Test: 2 Blocks completed in 1 min.   Goal Status: New  ASSESSMENT:  CLINICAL IMPRESSION: Pt required intermittent passive wrist and digit extension stretching and Wbing through the hand on table top to manage LUE flexor tonicity in the L hand between reps of reaching and releasing cards from hand.  Pt is improving active L shoulder ER, though not yet sufficient for washing hair bimanually.  Pt was given education in Vivistim this date as a potential option for post stroke rehab, though pt was advised that assessment for meeting criteria would be performed at a later date once pt has reached chronic phase of post  stroke recovery (>6 months).  Pt and spouse both very receptive to additional education re: Vivistim.  Pt verbalized concern of his medical hx of MG.  OT advised that all potential candidates would need to be assessed by surgeon to review medical hx to help determine candidacy.  Pt verbalized understanding.  Pt inquired about using a 1# weight in the home for proximal strengthening.  OT advised against this and encouraged AROM and AAROM only d/t pt lacking sufficient strength to control a weight (observed  this today), specifically in the L wrist and shoulder which could result in injury.  Pt verbalized understanding and was encouraged that ongoing HEP progressions will be made as tolerated and indicated.  Pt continues to benefit from OT services to work on improving LUE functioning to increase engagement of the LUE during daily task, and to provide education about compensatory strategies during ADLs/IADLs.  PERFORMANCE DEFICITS: in functional skills including ADLs, IADLs, coordination, dexterity, proprioception, sensation, edema, tone, ROM, strength, pain, Fine motor control, Gross motor control, mobility, balance, endurance, vision, and UE functional use, cognitive skills including perception, and psychosocial skills including coping strategies, environmental adaptation, and routines and behaviors.   IMPAIRMENTS: are limiting patient from ADLs, IADLs, and leisure.   CO-MORBIDITIES: may have co-morbidities  that affects occupational performance. Patient will benefit from skilled OT to address above impairments and improve overall function.  MODIFICATION OR ASSISTANCE TO COMPLETE EVALUATION: Min-Moderate modification of tasks or assist with assess necessary to complete an evaluation.  OT OCCUPATIONAL PROFILE AND HISTORY: Detailed assessment: Review of records and additional review of physical, cognitive, psychosocial history related to current functional performance.  CLINICAL DECISION MAKING: Moderate - several treatment options, min-mod task modification necessary  REHAB POTENTIAL: Good  EVALUATION COMPLEXITY: Moderate  PLAN:  OT FREQUENCY: 2x/week  OT DURATION: 12 weeks  PLANNED INTERVENTIONS: 97168 OT Re-evaluation, 97535 self care/ADL training, 02889 therapeutic exercise, 97530 therapeutic activity, 97112 neuromuscular re-education, 97140 manual therapy, 97018 paraffin, 02989 moist heat, 97010 cryotherapy, 97034 contrast bath, 97760 Orthotic Initial, 97763 Orthotic/Prosthetic subsequent,  passive range of motion, visual/perceptual remediation/compensation, energy conservation, and DME and/or AE instructions  RECOMMENDED OTHER SERVICES: PT and ST  CONSULTED AND AGREED WITH PLAN OF CARE: Patient and family member/caregiver  PLAN FOR NEXT SESSION: see above  Inocente Blazing, MS, OTR/L

## 2023-11-12 ENCOUNTER — Encounter: Admitting: Speech Pathology

## 2023-11-12 ENCOUNTER — Ambulatory Visit: Admitting: Occupational Therapy

## 2023-11-12 ENCOUNTER — Ambulatory Visit: Admitting: Physical Therapy

## 2023-11-12 DIAGNOSIS — I69354 Hemiplegia and hemiparesis following cerebral infarction affecting left non-dominant side: Secondary | ICD-10-CM

## 2023-11-12 DIAGNOSIS — R278 Other lack of coordination: Secondary | ICD-10-CM

## 2023-11-12 DIAGNOSIS — M6281 Muscle weakness (generalized): Secondary | ICD-10-CM

## 2023-11-12 DIAGNOSIS — R2681 Unsteadiness on feet: Secondary | ICD-10-CM

## 2023-11-12 DIAGNOSIS — R269 Unspecified abnormalities of gait and mobility: Secondary | ICD-10-CM

## 2023-11-12 DIAGNOSIS — R262 Difficulty in walking, not elsewhere classified: Secondary | ICD-10-CM

## 2023-11-12 NOTE — Therapy (Signed)
 OUTPATIENT PHYSICAL THERAPY TREATMENT  Patient Name: [name]. MRN: [id number] DOB:02/07/1951, 73 y.o., male Today's Date: 11/12/2023 PCP: Dennise Remak, MD  REFERRING PROVIDER:    Pegge Toribio PARAS, PA-C    END OF SESSION:    PT End of Session - 11/12/23 1533     Visit Number 26    Number of Visits 40    Date for PT Re-Evaluation 12/31/23    Authorization Type [employer] Medicare    Authorization Time Period [facility] auth#: [id number] for 6 PT vsts from 8/21-9/18    Authorization - Number of Visits --    Progress Note Due on Visit 30    PT Start Time 1532    PT Stop Time 1619    PT Time Calculation (min) 47 min    Equipment Utilized During Treatment Gait belt    Activity Tolerance Patient tolerated treatment well;No increased pain    Behavior During Therapy WFL for tasks assessed/performed            Past Medical History:  Diagnosis Date   Anemia    Cervical myelopathy (HCC)    Chronic venous insufficiency    [name] (chronic kidney disease), stage III (HCC)    Diabetes mellitus without complication (HCC)    GERD (gastroesophageal reflux disease)    Hypercholesteremia    Hypothyroidism    Kidney stones    about 50 in the past--last in March 4/24   Leg weakness, bilateral    due to Myasthenia Gravis   Myasthenia gravis (HCC)    Spinal stenosis    Vitamin B 12 deficiency    Vitamin B 12 deficiency    Wears hearing aid    bilateral   Past Surgical History:  Procedure Laterality Date   BACK SURGERY  09/2015   CARDIAC CATHETERIZATION     CATARACT EXTRACTION W/ INTRAOCULAR LENS IMPLANT     CATARACT EXTRACTION W/PHACO Right 12/21/2015   Procedure: CATARACT EXTRACTION PHACO AND INTRAOCULAR LENS PLACEMENT (IOC);  Surgeon: Dene Etienne, MD;  Location: [facility];  Service: Ophthalmology;  Laterality: Right;  DIABETIC - insulin  and oral meds   COLONOSCOPY     SHOULDER SURGERY     labrum repair   TONSILLECTOMY     Patient Active Problem List    Diagnosis Date Noted   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) 10/11/2023   High blood pressure 08/13/2023   Anemia 08/13/2023   Constipation 08/13/2023   Myasthenia gravis (HCC) 08/13/2023   Acute ischemic right MCA stroke (HCC) 07/19/2023   Lymphedema 12/27/2016   Leg pain 07/15/2016   Chronic venous insufficiency 07/15/2016   Swelling of limb 07/15/2016   Type 2 diabetes mellitus (HCC) 07/15/2016   Hyperlipidemia 07/15/2016    ONSET DATE: 07/19/23  REFERRING DIAG:  Diagnosis  I63.511 (ICD-10-CM) - Cerebral infarction due to unspecified occlusion or stenosis of right middle cerebral artery    THERAPY DIAG:  Other lack of coordination  Muscle weakness (generalized)  Hemiplegia and hemiparesis following cerebral infarction affecting left non-dominant side (HCC)  Difficulty in walking, not elsewhere classified  Unsteadiness on feet  Abnormality of gait and mobility  Rationale for Evaluation and Treatment: Rehabilitation  SUBJECTIVE:  SUBJECTIVE STATEMENT:   Pt confirms he has been walking 12-42ft maximum at home using bari-RW with at least 2 person assistance for safety because his gait is not yet functional at this time. Pt/wife state this past Sunday, 8/17, the patient had L LOB when navigating through doorway resulting in a fall hitting his L elbow on the ground - patient states when he felt the LOB he just started to sit down to protect himself. Pt states he had 2 people's assistance and use of their at home sit<>stand device (stedy) to safely get back into standing.   Pt states the next day (yesterday) he walked from his dining room table to his chair in the living room ~19ft with 3 people's assistance (wife, daughter, and son-in-law - 1 guarding  on his L, 1 in front, and 1 bringing w/c follow).   Pt and wife state they are working on L LE seated marches and LAQs to improve his ability to manage L LE with decreased assistance.  Follow-up with [facility] tomorrow, 8/20, regarding L knee brace   PERTINENT HISTORY:  CVA with Left hemiplegia in April 2025. Pt started on a swedish knee cage at [facility] due to uncontrolled hyperextension in stance. Pt had not been moving much since discharging from [facility], as therapists instructed pt to perform transfers only at home due to high fall risk with ambulation. Continues to have significant weakenss in the LUE with difficulty extending fingers as well as numbness and inattention to the L side of body resulting in multiple cuts on Arm and leg with WC mobility in home. Will be attaining custom power WC from [employer]. Has loaner currently.   PAIN:  Are you having pain? No  PRECAUTIONS: Fall, history of wounds on LLE   *Latex allergy   WEIGHT BEARING RESTRICTIONS: No  FALLS: Has patient fallen in last 6 months? Yes. Number of falls 1  LIVING ENVIRONMENT: Lives with: lives with their spouse Lives in: House/apartment Stairs: Ramps installed while in the Hospital  Has following equipment at home: Vannie - 2 wheeled and Wheelchair (power)  PLOF: Independent with basic ADLs, Independent with household mobility without device, and Independent with community mobility with device with [facility]   PATIENT GOALS: relearn to Walk 15-57ft.   OBJECTIVE:                                                                                                                              TREATMENT DATE: 11/12/2023  Therapist and pt discussed therapy POC given recent limited visits approved in new insurance authorization. Pt currently requires at least 2 people in order to safely participate in ambulation at home which is not functional; however, pt demonstrates ability to progress to be at least a household level functional  ambulator using LRAD with continued skilled physical therapy.  Pt arrives in transport chair.   Discussed safe options for floor transfers at home if another fall were to occur, including  use of air mattress to get in sitting and then using sit>stand device vs potentially purchasing a floor transfer device.  Donned post-op knee brace to control knee hyperextension as opposed to [facility] to use during session until pt has follow-up with [facility] tomorrow.  Sit>stand transport chair>bari-RW with L hand splint and skilled min A for balance while rising to stand. Throughout session, pt is able to independently manage L hand placement on/off RW hand splint without cuing.  Short distance ambulatory transfer transport chair>EOM using bari-RW, requiring transfer towards seat on his L side, with skilled light min A for balance - pt does excellent job turning, stepping feet as needed, and managing AD to maintain balance with only min cuing to step L LE back fully.   Gait training ~58ft x2 (no break) using bari-RW with L hand splint and L knee brace with skilled min assist for balance. Pt demonstrating the following gait deviations with therapist providing the described cuing and facilitation for improvement:  1st route focused on R turns, 2nd focused on L turns Noticed with L turns pt has increased L LE adduction (vs potentially due to fatigue?) Pt with downward gaze - cuing to look up and scan environment Pt with very slow gait speed although reciprocal pattern Lacks L pelvis translation over L stance limb  Therapist providing manual facilitation for increased L hip extension and forward progression of pelvis Would benefit from gait training focused on forward propulsion  Dynamic gait training using bari-RW including:  Forward/backwards ~41ft each direction x3 reps Skilled heavy min/mod A for balance, especially due to posterior lean during backwards gait initially Cuing for reciprocal stepping pattern  during posterior gait, with focus on increased L LE step length   Educated patient on local stroke support group as well as [place] Stroke Event in September.   PATIENT EDUCATION: Education details: knee cage needs assessmnet as it is causeing crossover gait.  Person educated: Patient and Spouse Education method: Explanation, VC/TC/Demo Education comprehension: verbalized understanding  HOME EXERCISE PROGRAM: Access Code: [email or web address] URL: [email or web address] Date: 09/11/2023 Prepared by: Massie Dollar  Exercises - Seated Knee Extension AROM  - 1 x daily - 4 x weekly - 3 sets - 10 reps - Seated Hip Abduction  - 1 x daily - 4 x weekly - 3 sets - 10 reps - Seated March  - 1 x daily - 4 x weekly - 3 sets - 10 reps - Sit to Stand with Counter Support  - 1 x daily - 4 x weekly - 3 sets - 10 reps - Supine Short Arc Quad  - 1 x daily - 4 x weekly - 3 sets - 10 reps - Supine Hip Abduction  - 1 x daily - 4 x weekly - 3 sets - 10 reps - Supine Bridge  - 1 x daily - 4 x weekly - 3 sets - 3 reps - 2sec  hold  GOALS: Goals reviewed with patient? Yes  SHORT TERM GOALS: Target date: 09/25/2023 Patient will be independent in home exercise program to improve strength/mobility for better functional independence with ADLs. Baseline: Initiated on 09/11/2023 10/22/2023: will update when appropriate Goal status: IN PROGRESS  LONG TERM GOALS: Target date: 11/13/2023  Patient will increase SIS-16 score to equal to or greater than 10points to demonstrate statistically significant improvement in mobility and quality of life.  Baseline: 49/80 09/19/2023: 45/80 10/22/2023: 46/80 Goal status: IN PROGRESS  2.  Patient (> 31 years old) will complete five times sit to  stand test in < 15 seconds indicating an increased LE strength and improved balance. Baseline: 1:15min 09/19/2023: 56.39 seconds using R UE support to push-up from armrest of green chair and requiring skilled min A for balance and  lifting/lowering  10/22/2023: 27.10 seconds using R UE support to push-up from armrest  8/12: 27.7 sec with RUE pushing from arm rest.  Goal status: IN PROGRESS  3.  Patient will increase FIST score by > 6 points to demonstrate decreased fall risk during functional activities Baseline:  43/56  09/23/2023: 53/56 Goal status: MET and UPGRADED  09/23/2023: Patient will increase Berg Balance score to > 45/56 to demonstrate improved balance and decreased fall risk during functional activities and ADLs.  Baseline: 09/23/2023: 14/56 7/31: 20 Goal status: IN PROGRESS  4.  Patient will increase 10 meter walk test to >0.32m/s as to improve gait speed for better community ambulation and to reduce fall risk. Baseline: 09/09/2023: 0.103 m/s ( and 37 seconds) using bari-RW with skilled heavy min A and +2 w/c follow for safety  09/19/2023: 0.28m/s using bari-RW with skilled min A of 1 and +2 w/c follow for safety 10/22/2023: 0.1875 m/s using bari-RW with L hand splint, L swedish knee cage, and only skilled light min A of 1 (no wheelchair follow)  8/12: 0.168m/s with RW, and L hand splint. No knee cage on this day.  Goal status: IN PROGRESS  5.  Patient will reduce timed up and go to <11 seconds to reduce fall risk and demonstrate improved transfer/gait ability. Baseline: unable to complete turn at 10 ft. Mod-max assist due to poor control of LW on RW, resulting in L lateral LOB  09/09/2023: and 32 seconds using bari-RW with L hand splint and +2 on R side for safety, therapist providing skilled min A on L side (Had pt set-up L hand on RW splint, get L foot in proper positioning, and scoot forward in seat before starting the timer) 09/19/2023: 74min43 seconds using bari-RW with only skilled min A of 1 (Had pt set-up L hand on RW splint, get L foot in proper positioning, and scoot forward in seat before starting the timer) 7/2: 78sec(1:18.125) with RW and min assist overall.  10/22/2023: 1 min, 30 seconds using  bari-RW with L hand splint and skilled light min assist  8/12: 1:01 min with RW and L hand splint.  Goal status: IN PROGRESS   ASSESSMENT:  CLINICAL IMPRESSION:   Patient arrives highly motivated to participate in therapy session. Pt with scheduled follow-up visit with [facility] tomorrow regarding need for larger LEFT SKC (Swedish Knee Cage). Therapist continues to utilize L hinged knee brace during session to improve L knee hyperextension control while awaiting follow-up with [facility]. Therapy session continued to focus on dynamic gait training using bari-RW with focus on turning, navigating around large obstacles, and progression to backwards gait training. Patient initially requiring consistent mod A for posterior lean/LOB during backwards gait, but improves with cuing/facilitation for increased L LE step length. Patient will benefit from continued progression of dynamic gait training including lateral stepping, backwards, obstacle navigation all while focusing on promoting higher intensity gait training to promote improved motor plan for more consistent L LE foot placement during swing advancement, given impaired sensation. [name] will benefit from further skilled PT to improve these deficits in order to increase QOL, decrease fall risk, increase independence with functional mobility, and increase independence with ADLs.    OBJECTIVE IMPAIRMENTS: Abnormal gait, cardiopulmonary status limiting activity, decreased  activity tolerance, decreased balance, decreased cognition, decreased coordination, decreased endurance, decreased knowledge of condition, decreased knowledge of use of DME, decreased mobility, difficulty walking, decreased ROM, decreased strength, decreased safety awareness, dizziness, hypomobility, increased fascial restrictions, impaired perceived functional ability, increased muscle spasms, impaired sensation, impaired tone, impaired UE functional use, impaired  vision/preception, improper body mechanics, postural dysfunction, and obesity.   ACTIVITY LIMITATIONS: carrying, lifting, bending, sitting, standing, squatting, stairs, transfers, bed mobility, bathing, toileting, dressing, reach over head, hygiene/grooming, locomotion level, and caring for others  PARTICIPATION LIMITATIONS: meal prep, cleaning, laundry, medication management, interpersonal relationship, driving, shopping, community activity, and yard work  PERSONAL FACTORS: Age, Past/current experiences, Time since onset of injury/illness/exacerbation, and 3+ comorbidities: MG, HTN, CVA, lymphedema are also affecting patient's functional outcome.   REHAB POTENTIAL: Good  CLINICAL DECISION MAKING: Unstable/unpredictable  EVALUATION COMPLEXITY: High  PLAN:  PT FREQUENCY: 1-2x/week  PT DURATION: 8 weeks  PLANNED INTERVENTIONS: [id number]- PT Re-evaluation, 97750- Physical Performance Testing, 97110-Therapeutic exercises, 97530- Therapeutic activity, [id number]- Neuromuscular re-education, 97535- Self Care, [id number]- Manual therapy, [id number]- Gait training, [id number]- Orthotic Initial, [id number]- Orthotic/Prosthetic subsequent, [phone]- Canalith repositioning, [id number]- Electrical stimulation (unattended), [phone]- Electrical stimulation (manual), [id number]- Wound care (first 20 sq cm), 97598- Wound care (each additional 20 sq cm), Patient/Family education, Balance training, Stair training, Taping, Dry Needling, Joint mobilization, Joint manipulation, Vestibular training, Visual/preceptual remediation/compensation, Cognitive remediation, DME instructions, Wheelchair mobility training, Cryotherapy, and Moist heat  PLAN FOR NEXT SESSION:  HIGT (high intensity gait training) using litegait harness either on treadmill vs overground Dynamic gait training using bari-RW vs litegait Backwards Side stepping Obstacle navigation Working on forward propulsion with increased L hip extension and forward progression of pelvis of L stance  phase   Kean Gautreau, PT, DPT, NCS, CSRS Physical Therapist - [place]  [facility]  5:04 PM 11/12/23

## 2023-11-13 NOTE — Therapy (Signed)
 Occupational Therapy Neuro Treatment Note  Patient Name: [name]. MRN: [id number] DOB:[birth year], 73 y.o., male Today's Date: 11/13/2023  PCP: Alm Rower, MD REFERRING PROVIDER: Hermann Toribio PARAS, PA-C  END OF SESSION:  OT End of Session - 11/13/23 0820     Visit Number 24    Number of Visits 48    Date for OT Re-Evaluation 01/21/24    OT Start Time 1445    OT Stop Time 1530    OT Time Calculation (min) 45 min    Activity Tolerance Patient tolerated treatment well    Behavior During Therapy [facility] for tasks assessed/performed            Past Medical History:  Diagnosis Date   Anemia    Cervical myelopathy (HCC)    Chronic venous insufficiency    [name] (chronic kidney disease), stage III (HCC)    Diabetes mellitus without complication (HCC)    GERD (gastroesophageal reflux disease)    Hypercholesteremia    Hypothyroidism    Kidney stones    about 50 in the past--last in March 4/24   Leg weakness, bilateral    due to Myasthenia Gravis   Myasthenia gravis (HCC)    Spinal stenosis    Vitamin B 12 deficiency    Vitamin B 12 deficiency    Wears hearing aid    bilateral   Past Surgical History:  Procedure Laterality Date   BACK SURGERY  09/2015   CARDIAC CATHETERIZATION     CATARACT EXTRACTION W/ INTRAOCULAR LENS IMPLANT     CATARACT EXTRACTION W/PHACO Right 12/21/2015   Procedure: CATARACT EXTRACTION PHACO AND INTRAOCULAR LENS PLACEMENT (IOC);  Surgeon: Dene Etienne, MD;  Location: [facility];  Service: Ophthalmology;  Laterality: Right;  DIABETIC - insulin  and oral meds   COLONOSCOPY     SHOULDER SURGERY     labrum repair   TONSILLECTOMY     Patient Active Problem List   Diagnosis Date Noted   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) 10/11/2023   High blood pressure 08/13/2023   Anemia 08/13/2023   Constipation 08/13/2023   Myasthenia gravis (HCC) 08/13/2023   Acute ischemic right MCA  stroke (HCC) 07/19/2023   Lymphedema 12/27/2016   Leg pain 07/15/2016   Chronic venous insufficiency 07/15/2016   Swelling of limb 07/15/2016   Type 2 diabetes mellitus (HCC) 07/15/2016   Hyperlipidemia 07/15/2016   ONSET DATE: 07/13/2023  REFERRING DIAG: Acute ischemic R MCA CVA  THERAPY DIAG: muscle weakness (generalized), other lack of coordination, vision disturbance, hemiplegia and hemiparesis following cerebral infarction affecting left non-dominant side (HCC)  Rationale for Evaluation and Treatment: Rehabilitation  SUBJECTIVE:  SUBJECTIVE STATEMENT: Pt reports doing well today.   Pt accompanied by: [name]  PERTINENT HISTORY: Pt. Was admitted to [facility] on 07/13/2023 after sustaining a CVA while on a trip to the beach. Pt. Was diagnosed with R MCA CVA. Pt. Was admitted to inpatient rehab from 07/19/2023-08/13/2023. Past medical history includes high BP, anemia, and Myasthenia Gravis.   PRECAUTIONS: None  WEIGHT BEARING RESTRICTIONS: No  PAIN:  11/07/23: mild pain in L hand digits 11/05/23: mild pain in L hand digits 10/31/23: No pain, however reports  discomfort in the left forearm with 4th, and 5th digit MP, and PIP extension.  10/29/23: No report of pain 10/24/2023: No pain during treatment session, pt. Reported having pain in L dorsal forearm when using walker splint at home.  10/22/2023: No pain. 10/14/2023: No  pain 10/07/23: 4/10 pain L shoulder Are you having pain?  10/01/2023- Yes, 7/10 in L shoulder due to most recent fall that occurred this past Friday. 5/10 L wrist  FALLS: Has patient fallen in last 6 months? Yes  LIVING ENVIRONMENT: Lives with: lives with their family and lives with their spouse-  Lives in: House/apartment- 2 story home and resides mostly on the 1st floor Stairs: No- Ramped entrance Has following equipment at home:  Vannie, cane, Steadi, shower chair, handheld shower head, bedside commode  PLOF: Independent and Independent with basic  ADLs Independent at home   PATIENT GOALS: Would like to walk 10-20 feet each time.   OBJECTIVE:  Note: Objective measures were completed at Evaluation unless otherwise noted.  HAND DOMINANCE: Right  ADLs: Overall ADLs:  Transfers/ambulation related to ADLs: Eating: Independent, 100% R handed Grooming: independent UB Dressing: Can put on shirt independently LB Dressing: Difficulty with pants and requires assistance getting the pants on his feet, has difficulty putting on shoes and socks Toileting: Tot A for toilet hygiene. Uses Steadi during toilet transfers. Bathing: Requires assist with using the L arm to wash the R arm. Tub Shower transfers: Roll in shower, built in shower chair, grab bars and hand held shower head.  IADLs: Shopping: Total A (Wife typically did the shopping prior to onset)   Light housekeeping: Total A (wife typically performs prior to onset) Meal Prep: Independent with light snack prep Community mobility: No driving, able to get in/out of the car Medication management: Set up assistance from his wife using a pill box (pt. Is responsible for taking medications at the correct time) Financial management: family members check behind him. Handwriting: TBD Hobbies: gardening, wood working and sports- [employer] Work history: Owns a tax business MOBILITY STATUS: Needs Assist:    POSTURE COMMENTS:   Sitting balance: Good supported sitting balance  FUNCTIONAL OUTCOME MEASURES: TBD  UPPER EXTREMITY ROM:    Active ROM Right Eval  [facility] Right 09/23/2023 [facility] Left eval Left 09/23/2023 Left 10/29/23  Shoulder flexion   89(110) 109(114) 111(115)  Shoulder abduction   98(120) 109(120) 111(122)  Shoulder adduction       Shoulder extension       Shoulder internal rotation       Shoulder external rotation       Elbow flexion   120(140) 130(136) 141(145)  Elbow extension   -28(-10) -5(0) -18(0)  Wrist flexion   60(60) Rest at 60 50(65) 54(60)  Wrist extension   -48(42)  10(40) 20(50)  Wrist ulnar deviation     12(22)  Wrist radial deviation     10(20)  Wrist pronation     (82)90  Wrist supination     64(74)  (Blank rows = not tested)  Eval: L Digit flexion: 2nd:3cm (0cm) 3rd: 0cm (0cm), 4th: 0cm (0cm), 5th: 2cm (0cm)  09/23/23:  L Digit flexion to [facility]: 2nd: 3.5cm (0cm), 3rd: 4 cm(0cm), 4th 3.5cm (0cm), 5th: 2cm (0cm)  Eval:  L Digit extension: Active Gross digit extension through 10% of ROM  09/23/23:  L Digit extension: Active Gross digit extension through 50% of ROM  10/29/23:  L Digit extension: Active Gross digit extension through 25-50% of ROM  UPPER EXTREMITY MMT:     MMT Right eval Left eval Left 09/23/2023 Left 10/29/23  Shoulder flexion 5 3- 3-/5 3-/5  Shoulder abduction 5 3- 3-/5 3-/5  Shoulder adduction      Shoulder extension      Shoulder internal rotation  Shoulder external rotation      Middle trapezius      Lower trapezius      Elbow flexion 5 TBD 3+/5 4-/5  Elbow extension 5 TBD 4-/5 3-/5  Wrist flexion   2/5 3-/5  Wrist extension  2- 2/5 2+/5  Wrist ulnar deviation      Wrist radial deviation      Wrist pronation    3/5  Wrist supination    3-/5  (Blank rows = not tested)  HAND FUNCTION:  Eval:  Grip strength: Right: 94#, Left: 1#,   Lateral Key Pinch strength: Right: 14#, Left: 3#  10/29/23:   Grip strength: Left: 16#  Lateral Key Pinch strength: Left: 5#  COORDINATION:  Box and Blocks: Right: 34 blocks completed in 1 min., Left: 0 blocks completed in 1 min.    10/29/23:  Box and Blocks: Right: 34 blocks completed in 1 min., Left: 2 blocks completed in 1 min.  9 Hole Peg Test:  Left: To remove 9 pegs from a vertical position: 1 min. &13 sec.  SENSATION: Sensation feels different in both hands.  L hand Light touch- impaired L hand Proprioception- impaired  EDEMA: Has had hx of edema, and came to Eval session with swelling in the L hand/wrist/forearm.   *pt. Has a laceration from his  dog on the dorsal aspect of the L hand.   MUSCLE TONE: Increased tone through the UE and hand flexors.   COGNITION: Overall cognitive status: Within functional limits for tasks assessed  VISION: Subjective report: L sided inattention Baseline vision: Wears glasses for reading only Visual history: Right visual occlusion  VISION ASSESSMENT: To be assessed  PERCEPTION: TBD  PRAXIS: Impaired                                                                                                                 TREATMENT DATE: 11/07/2023  Estim:  Saebo Stim unit applied to the pt.'s left forearm at the wrist, and digit extensors to facilitate extension. Minimal, but consistent response with hand over hand assist required.  Therapeutic Activity:  -Pt. performed diagonal patterns of movement with LUE  for hand to face (opposite ear), and hand to the top of the head, and hand to the back of the head with intervals of elbow extension, and forearm supination with hand over hand assist required throughout each of the movement patterns in preparation for self-grooming tasks.  Therapeutic Exercise:  -AROM/AAROM/PROM for the LUE including left shoulder flexion, abduction, horizontal abduction, elbow flexion, extension, forearm supination, pronation, wrist extension, digit MP/PIP/and DIP extension to increase ROM, and decrease stiffness. -Emphasis was placed on 4th, and 5th digit MP, PIP, and DIP extension- No pain in the left forearm when extending 4th, and 5th digit MPs with wrist extension.  PATIENT EDUCATION: Education details:  Vivistim, HEP Person educated: Patient and spouse Education method: Explanation and Verbal cues Education comprehension: verbalized understanding and needs further education  HOME EXERCISE PROGRAM: -grasping/releasing tennis ball sized circular sphere, holding while performing  wrist extension -Repetitions for grasping/releasing 1 blocks.  GOALS: Goals reviewed with  patient? Yes  SHORT TERM GOALS: Target date:.   Pt. Will be independent with HEP for the LUE.  Baseline: 10/29/23: Pt. requires assist from wife with HEPs. Pt. Is consistently working on HEP's at home. 09/19/23: Requires assist from his wife Eval; no current HEP Goal status: Ongoing  LONG TERM GOALS: Target date: 01/21/2024  Pt. Will increase L shoulder flexion by 10 degrees to be able to reach up to shelves.  Baseline: 10/29/23: 888(884) 09/19/23: Pt. Continues to be limited with reaching up to shelves. Eval: L shoulder flexion is 89 (110). Goal status: Ongoing  2.  Pt. Will increase L shoulder abduction by 10 degrees to assist with underarm hygiene.  Baseline:10/29/23: 888(877) 09/19/23: Pt. Continues to require assist with underarm hygiene.Eval: L shoulder abduction is 98 (120).  Goal status: Ongoing  3.  Pt. Will improve L wrist extension by 10 degrees to be able to initiate anticipation of grasping for objects.  Baseline: 10/29/23: 20(55) 09/19/23: Consistent activation noted in the left wrist extensors with facilitation. Eval: -48 (42) Goal status: Ongoing  4.  Pt. Will improve active gross digit extension through 50% of the range to be able to release objects in his hand consistently.  Baseline: 10/28/20: Gross digit extension through 25-50% of the range. 09/19/23: gross digit extension noted through 25% range with facilitation.Eval: gross digit extension is 10% of the range  Goal status: Goal updated to 50% of the range  5.  Pt. Will independently demonstrate visual compensatory strategies when navigating through environment and during tabletop tasks. Baseline: 10/29/2023: Pt. Continues to less cuing for left sided awareness. 09/19/23: Pt. requires fewer cues for left sided awareness Eval: Pt. Requires consistent cuing for L sided awareness.  Goal status: Ongoing   6. Pt. Will improve left hand [facility] skills to be able to manipulate small objects during ADLs, and IADLs.   Baseline: 10/29/23: 9  Hole Peg Test: Pt. Was able to remove 9 pegs from vertical position on the pegboard in 1 min. & 13 sec. Pt. Is not yet able to pick up pegs from a horizontal position to place them in the pegboard.   Goal status: New   7. Pt. Will improve left hand function skills as evidence by improve score to 4 blocks on the Blocks, and Box test.   Baseline: 10/29/23: Box and Blocks Test: 2 Blocks completed in 1 min.   Goal Status: New  ASSESSMENT:  CLINICAL IMPRESSION:  Insurance authorization for additional visits has been requested as Pt. made excellent progress during the last progress reporting period. Pt. continues to benefit from continued OT services to work on improving LUE functioning. Pt. Is very motivated to work on regaining function in the LUE. Pt. Splint was reassessed woth adjustments were made to improve digit extension. Pt. continues to require hand-over-hand assist to move through diagonal patterns in preparation for hand to face patterns for self-grooming. Pt. was reassessed for the Saebo EStim unit with consistent  minimal  responses with manual assist required during each rep. Pt./caregiver education were provided with information about a [employer] home Estim unit. Pt continues to benefit from OT services to work on improving LUE functioning to increase engagement of the LUE during daily task, and to provide education about compensatory strategies during ADLs/IADLs.  PERFORMANCE DEFICITS: in functional skills including ADLs, IADLs, coordination, dexterity, proprioception, sensation, edema, tone, ROM, strength, pain, Fine motor control, Gross motor control, mobility, balance, endurance,  vision, and UE functional use, cognitive skills including perception, and psychosocial skills including coping strategies, environmental adaptation, and routines and behaviors.   IMPAIRMENTS: are limiting patient from ADLs, IADLs, and leisure.   CO-MORBIDITIES: may have co-morbidities  that affects occupational  performance. Patient will benefit from skilled OT to address above impairments and improve overall function.  MODIFICATION OR ASSISTANCE TO COMPLETE EVALUATION: Min-Moderate modification of tasks or assist with assess necessary to complete an evaluation.  OT OCCUPATIONAL PROFILE AND HISTORY: Detailed assessment: Review of records and additional review of physical, cognitive, psychosocial history related to current functional performance.  CLINICAL DECISION MAKING: Moderate - several treatment options, min-mod task modification necessary  REHAB POTENTIAL: Good  EVALUATION COMPLEXITY: Moderate  PLAN:  OT FREQUENCY: 2x/week  OT DURATION: 12 weeks  PLANNED INTERVENTIONS: 97168 OT Re-evaluation, 97535 self care/ADL training, 02889 therapeutic exercise, 97530 therapeutic activity, 97112 neuromuscular re-education, 97140 manual therapy, 97018 paraffin, 02989 moist heat, 97010 cryotherapy, 97034 contrast bath, 97760 Orthotic Initial, 97763 Orthotic/Prosthetic subsequent, passive range of motion, visual/perceptual remediation/compensation, energy conservation, and DME and/or AE instructions  RECOMMENDED OTHER SERVICES: PT and ST  CONSULTED AND AGREED WITH PLAN OF CARE: Patient and family member/caregiver  PLAN FOR NEXT SESSION: see above  Richardson Otter, MS, OTR/L   11/13/23

## 2023-11-14 ENCOUNTER — Encounter: Admitting: Speech Pathology

## 2023-11-14 ENCOUNTER — Ambulatory Visit: Admitting: Physical Therapy

## 2023-11-14 ENCOUNTER — Ambulatory Visit: Admitting: Occupational Therapy

## 2023-11-14 DIAGNOSIS — R2681 Unsteadiness on feet: Secondary | ICD-10-CM

## 2023-11-14 DIAGNOSIS — R262 Difficulty in walking, not elsewhere classified: Secondary | ICD-10-CM

## 2023-11-14 DIAGNOSIS — M6281 Muscle weakness (generalized): Secondary | ICD-10-CM

## 2023-11-14 DIAGNOSIS — I69354 Hemiplegia and hemiparesis following cerebral infarction affecting left non-dominant side: Secondary | ICD-10-CM

## 2023-11-14 DIAGNOSIS — R278 Other lack of coordination: Secondary | ICD-10-CM

## 2023-11-14 DIAGNOSIS — R269 Unspecified abnormalities of gait and mobility: Secondary | ICD-10-CM

## 2023-11-14 NOTE — Therapy (Signed)
 OUTPATIENT PHYSICAL THERAPY TREATMENT  Patient Name: [name]. MRN: [id number] DOB:Mar 25, 1951, 73 y.o., male Today's Date: 11/14/2023 PCP: Dennise Remak, MD  REFERRING PROVIDER:    Pegge Toribio PARAS, PA-C    END OF SESSION:    PT End of Session - 11/14/23 1018     Visit Number 27    Number of Visits 40    Date for PT Re-Evaluation 12/31/23    Authorization Type [employer] Medicare    Authorization Time Period [facility] auth#: [id number] for 6 PT vsts from 8/21-9/18 (8/21 is 1 of 6)    Progress Note Due on Visit 30    PT Start Time 1020    PT Stop Time 1100    PT Time Calculation (min) 40 min    Equipment Utilized During Treatment Gait belt    Activity Tolerance Patient tolerated treatment well;No increased pain    Behavior During Therapy WFL for tasks assessed/performed             Past Medical History:  Diagnosis Date   Anemia    Cervical myelopathy (HCC)    Chronic venous insufficiency    [name] (chronic kidney disease), stage III (HCC)    Diabetes mellitus without complication (HCC)    GERD (gastroesophageal reflux disease)    Hypercholesteremia    Hypothyroidism    Kidney stones    about 50 in the past--last in March 4/24   Leg weakness, bilateral    due to Myasthenia Gravis   Myasthenia gravis (HCC)    Spinal stenosis    Vitamin B 12 deficiency    Vitamin B 12 deficiency    Wears hearing aid    bilateral   Past Surgical History:  Procedure Laterality Date   BACK SURGERY  09/2015   CARDIAC CATHETERIZATION     CATARACT EXTRACTION W/ INTRAOCULAR LENS IMPLANT     CATARACT EXTRACTION W/PHACO Right 12/21/2015   Procedure: CATARACT EXTRACTION PHACO AND INTRAOCULAR LENS PLACEMENT (IOC);  Surgeon: Dene Etienne, MD;  Location: 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) 10/11/2023   High blood pressure 08/13/2023   Anemia 08/13/2023   Constipation 08/13/2023   Myasthenia gravis (HCC) 08/13/2023   Acute ischemic right MCA stroke (HCC) 07/19/2023   Lymphedema 12/27/2016   Leg pain 07/15/2016   Chronic venous insufficiency 07/15/2016   Swelling of limb 07/15/2016   Type 2 diabetes mellitus (HCC) 07/15/2016   Hyperlipidemia 07/15/2016    ONSET DATE: 07/19/23  REFERRING DIAG:  Diagnosis  I63.511 (ICD-10-CM) - Cerebral infarction due to unspecified occlusion or stenosis of right middle cerebral artery    THERAPY DIAG:  Muscle weakness (generalized)  Other lack of coordination  Hemiplegia and hemiparesis following cerebral infarction affecting left non-dominant side (HCC)  Difficulty in walking, not elsewhere classified  Unsteadiness on feet  Abnormality of gait and mobility  Rationale for Evaluation and Treatment: Rehabilitation  SUBJECTIVE:  SUBJECTIVE STATEMENT:   Pt had follow-up appointment with [facility] yesterday and they tried to stretch his current [facility] and they have ordered him an XL (pt's current SKC is a large). Pt states he has not tried his SKC since they stretched it yesterday. Pt has another follow-up with [facility] on Tuesday, 8/26.   Patient reports otherwise no updates since last session.   PERTINENT HISTORY:  CVA with Left hemiplegia in April 2025. Pt started on a swedish knee cage at CIR due to uncontrolled hyperextension in stance. Pt had not been moving much since discharging from [facility], as therapists instructed pt to perform transfers only at home due to high fall risk with ambulation. Continues to have significant weakenss in the LUE with difficulty  extending fingers as well as numbness and inattention to the L side of body resulting in multiple cuts on Arm and leg with WC mobility in home. Will be attaining custom power WC from [employer]. Has loaner currently.   PAIN:  Are you having pain? No  PRECAUTIONS: Fall, history of wounds on LLE   *Latex allergy   WEIGHT BEARING RESTRICTIONS: No  FALLS: Has patient fallen in last 6 months? Yes. Number of falls 1  LIVING ENVIRONMENT: Lives with: lives with their spouse Lives in: House/apartment Stairs: Ramps installed while in the Hospital  Has following equipment at home: Vannie - 2 wheeled and Wheelchair (power)  PLOF: Independent with basic ADLs, Independent with household mobility without device, and Independent with community mobility with device with [facility]   PATIENT GOALS: relearn to Walk 15-31ft.   OBJECTIVE:                                                                                                                              TREATMENT DATE: 11/14/2023  Pt arrives in transport chair.   Donned pt's SKC for session since it was stretched by [facility] yesterday - continues to be too tight around his lower leg; however, pt's gait mechanics are better with the [facility] compared to the hinged knee brace during session.  Sit>stand transport chair>bari-RW with L hand splint and skilled heavy min A for balance while rising to stand then only light min A when turning during L stand pivot. Throughout session, pt is able to independently manage L hand placement on/off RW hand splint without cuing.    Gait training 13ft using bari-RW with L hand splint and wearing L SKC with skilled min assist for balance. Pt demonstrating the following gait deviations with therapist providing the described cuing and facilitation for improvement:  Pt states he didn't feel his L knee hyperextend at all with the [facility] (like he does with the hinged knee brace) and states he didn't feel the lateral instability  at his knee that he was previously feeling with his [facility] (training with the hinged knee brace may have helped with this) Pt achieves reciprocal stepping pattern until ~midway point when he starts  to fatigue causing lack of terminal lower leg advancement via knee extension during swing to achieve heel strike, but with cuing is able to correct  Pt does have heavy L foot landing on initial contact Significantly decreased gait speed with increased R stance time Improving ability to maintain upright posture/forward gaze, which improves L hip extension and forward pelvis progression over L stance phase  Standing L LE NMR with B UE support on bari-RW targeting improved motor control to perform L posterolateral step to external target x6 reps   Dynamic gait training using bari-RW as follows: Lateral side stepping ~29ft each direction x2 reps Cuing not to step L LE too far anterior when bringing it in while stepping towards R because it places pt at increased risk for L posterior LOB Pt improves in ability to perform L lateral stepping with repetition Good AD management and sequencing during lateral stepping with min cuing Requires skilled min A for balance throughout  Short distance ~44ft ambulatory transfer to transport chair using bari-RW, requiring transfer towards seat on his L side, with skilled light min A for balance - pt does excellent job turning, stepping feet as needed, and managing AD to maintain balance with only min cuing to step L LE back fully.   PATIENT EDUCATION: Education details: knee cage needs assessmnet as it is causeing crossover gait.  Person educated: Patient and Spouse Education method: Explanation, VC/TC/Demo Education comprehension: verbalized understanding  HOME EXERCISE PROGRAM: Access Code: [email or web address] URL: [email or web address] Date: 09/11/2023 Prepared by: Massie Dollar  Exercises - Seated Knee Extension AROM  - 1 x daily - 4 x weekly - 3 sets - 10  reps - Seated Hip Abduction  - 1 x daily - 4 x weekly - 3 sets - 10 reps - Seated March  - 1 x daily - 4 x weekly - 3 sets - 10 reps - Sit to Stand with Counter Support  - 1 x daily - 4 x weekly - 3 sets - 10 reps - Supine Short Arc Quad  - 1 x daily - 4 x weekly - 3 sets - 10 reps - Supine Hip Abduction  - 1 x daily - 4 x weekly - 3 sets - 10 reps - Supine Bridge  - 1 x daily - 4 x weekly - 3 sets - 3 reps - 2sec  hold  GOALS: Goals reviewed with patient? Yes  SHORT TERM GOALS: Target date: 12/12/2023  Patient will be independent in home exercise program to improve strength/mobility for better functional independence with ADLs. Baseline: Initiated on 09/11/2023 10/22/2023: will update when appropriate Goal status: IN PROGRESS  LONG TERM GOALS: Target date: 12/31/2023  Patient will increase SIS-16 score to equal to or greater than 10points to demonstrate statistically significant improvement in mobility and quality of life.  Baseline: 49/80 09/19/2023: 45/80 10/22/2023: 46/80 Goal status: IN PROGRESS  2.  Patient (> 64 years old) will complete five times sit to stand test in < 15 seconds indicating an increased LE strength and improved balance. Baseline: 1:33min 09/19/2023: 56.39 seconds using R UE support to push-up from armrest of green chair and requiring skilled min A for balance and lifting/lowering  10/22/2023: 27.10 seconds using R UE support to push-up from armrest  8/12: 27.7 sec with RUE pushing from arm rest.  Goal status: IN PROGRESS  3.  Patient will increase FIST score by > 6 points to demonstrate decreased fall risk during functional activities Baseline:  43/56  09/23/2023: 53/56 Goal status: MET  and UPGRADED  09/23/2023: Patient will increase Berg Balance score to > 45/56 to demonstrate improved balance and decreased fall risk during functional activities and ADLs.  Baseline: 09/23/2023: 14/56 7/31: 20 Goal status: IN PROGRESS  4.  Patient will increase 10 meter walk  test to >0.77m/s as to improve gait speed for better community ambulation and to reduce fall risk. Baseline: 09/09/2023: 0.103 m/s ( and 37 seconds) using bari-RW with skilled heavy min A and +2 w/c follow for safety  09/19/2023: 0.68m/s using bari-RW with skilled min A of 1 and +2 w/c follow for safety 10/22/2023: 0.1875 m/s using bari-RW with L hand splint, L swedish knee cage, and only skilled light min A of 1 (no wheelchair follow)  8/12: 0.125m/s with RW, and L hand splint. No knee cage on this day.  Goal status: IN PROGRESS  5.  Patient will reduce timed up and go to <11 seconds to reduce fall risk and demonstrate improved transfer/gait ability. Baseline: unable to complete turn at 10 ft. Mod-max assist due to poor control of LW on RW, resulting in L lateral LOB  09/09/2023: and 32 seconds using bari-RW with L hand splint and +2 on R side for safety, therapist providing skilled min A on L side (Had pt set-up L hand on RW splint, get L foot in proper positioning, and scoot forward in seat before starting the timer) 09/19/2023: 59min43 seconds using bari-RW with only skilled min A of 1 (Had pt set-up L hand on RW splint, get L foot in proper positioning, and scoot forward in seat before starting the timer) 7/2: 78sec(1:18.125) with RW and min assist overall.  10/22/2023: 1 min, 30 seconds using bari-RW with L hand splint and skilled light min assist  8/12: 1:01 min with RW and L hand splint.  Goal status: IN PROGRESS   ASSESSMENT:  CLINICAL IMPRESSION:   Patient arrives highly motivated to participate in therapy session and increase his independence with functional mobility from an ambulatory level. Patient had follow-up with [facility] yesterday and they ordered him an XL brace with another follow-up scheduled next Tuesday, 8/26. [facility] stretched his current [facility]; therefore, used it during session, but it continues to be too tight on patient's lower leg (doffed at end of  session). Patient does demonstrate improved knee alignment with SKC compared to hinged knee brace and pt reports now he isn't feeling the lateral instability at his knee that he was previously feeling when wearing the [facility]. Therapy session continued focus on increased intensity of gait training with patient ambulating 148ft without a seated rest break today and with only skilled min A of 1 person! Patient also participated in dynamic gait of lateral side stepping to improve L LE motor control as pt noted to have impaired ability to step towards his L when completing transfers. Patient will benefit from continued progression of dynamic gait training including lateral stepping, backwards, and obstacle navigation all while focusing on promoting higher intensity gait training to promote improved motor plan for more consistent L LE foot placement during swing advancement, given impaired sensation. Mr. Iiams will benefit from further skilled PT to improve these deficits in order to increase QOL, decrease fall risk, increase independence with functional mobility, and increase independence with ADLs.    OBJECTIVE IMPAIRMENTS: Abnormal gait, cardiopulmonary status limiting activity, decreased activity tolerance, decreased balance, decreased cognition, decreased coordination, decreased endurance, decreased knowledge of condition, decreased knowledge of use of DME, decreased mobility, difficulty walking, decreased ROM, decreased  strength, decreased safety awareness, dizziness, hypomobility, increased fascial restrictions, impaired perceived functional ability, increased muscle spasms, impaired sensation, impaired tone, impaired UE functional use, impaired vision/preception, improper body mechanics, postural dysfunction, and obesity.   ACTIVITY LIMITATIONS: carrying, lifting, bending, sitting, standing, squatting, stairs, transfers, bed mobility, bathing, toileting, dressing, reach over head, hygiene/grooming, locomotion  level, and caring for others  PARTICIPATION LIMITATIONS: meal prep, cleaning, laundry, medication management, interpersonal relationship, driving, shopping, community activity, and yard work  PERSONAL FACTORS: Age, Past/current experiences, Time since onset of injury/illness/exacerbation, and 3+ comorbidities: MG, HTN, CVA, lymphedema are also affecting patient's functional outcome.   REHAB POTENTIAL: Good  CLINICAL DECISION MAKING: Unstable/unpredictable  EVALUATION COMPLEXITY: High  PLAN:  PT FREQUENCY: 1-2x/week  PT DURATION: 8 weeks  PLANNED INTERVENTIONS: 97164- PT Re-evaluation, 97750- Physical Performance Testing, 97110-Therapeutic exercises, 97530- Therapeutic activity, [id number]- Neuromuscular re-education, 97535- Self Care, [id number]- Manual therapy, [id number]- Gait training, [phone]- Orthotic Initial, [phone]- Orthotic/Prosthetic subsequent, [phone]- Canalith repositioning, [id number]- Electrical stimulation (unattended), [phone]- Electrical stimulation (manual), [id number]- Wound care (first 20 sq cm), 97598- Wound care (each additional 20 sq cm), Patient/Family education, Balance training, Stair training, Taping, Dry Needling, Joint mobilization, Joint manipulation, Vestibular training, Visual/preceptual remediation/compensation, Cognitive remediation, DME instructions, Wheelchair mobility training, Cryotherapy, and Moist heat  PLAN FOR NEXT SESSION:  HIGT (high intensity gait training) using litegait harness either on treadmill vs overground Dynamic gait training using bari-RW vs litegait Backwards Side stepping Obstacle navigation Working on forward propulsion with increased L hip extension and forward progression of pelvis of L stance phase    Latarsha Zani, PT, DPT, NCS, CSRS Physical Therapist - [place]  [facility]  11:04 AM 11/14/23

## 2023-11-14 NOTE — Therapy (Signed)
 Occupational Therapy Neuro Treatment Note  Patient Name: [name]. MRN: [id number] DOB:06-17-1950, 73 y.o., male Today's Date: 11/14/2023  PCP: Alm Rower, MD REFERRING PROVIDER: Hermann Toribio PARAS, PA-C  END OF SESSION:  OT End of Session - 11/14/23 1127     Visit Number 25    Number of Visits 48    Date for OT Re-Evaluation 01/21/24    OT Start Time 0930    OT Stop Time 1023    OT Time Calculation (min) 53 min    Activity Tolerance Patient tolerated treatment well    Behavior During Therapy WFL for tasks assessed/performed            Past Medical History:  Diagnosis Date   Anemia    Cervical myelopathy (HCC)    Chronic venous insufficiency    [name] (chronic kidney disease), stage III (HCC)    Diabetes mellitus without complication (HCC)    GERD (gastroesophageal reflux disease)    Hypercholesteremia    Hypothyroidism    Kidney stones    about 50 in the past--last in March 4/24   Leg weakness, bilateral    due to Myasthenia Gravis   Myasthenia gravis (HCC)    Spinal stenosis    Vitamin B 12 deficiency    Vitamin B 12 deficiency    Wears hearing aid    bilateral   Past Surgical History:  Procedure Laterality Date   BACK SURGERY  09/2015   CARDIAC CATHETERIZATION     CATARACT EXTRACTION W/ INTRAOCULAR LENS IMPLANT     CATARACT EXTRACTION W/PHACO Right 12/21/2015   Procedure: CATARACT EXTRACTION PHACO AND INTRAOCULAR LENS PLACEMENT (IOC);  Surgeon: Dene Etienne, MD;  Location: [facility];  Service: Ophthalmology;  Laterality: Right;  DIABETIC - insulin  and oral meds   COLONOSCOPY     SHOULDER SURGERY     labrum repair   TONSILLECTOMY     Patient Active Problem List   Diagnosis Date Noted   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) 10/11/2023   High blood pressure 08/13/2023   Anemia 08/13/2023   Constipation 08/13/2023   Myasthenia gravis (HCC) 08/13/2023   Acute ischemic right MCA  stroke (HCC) 07/19/2023   Lymphedema 12/27/2016   Leg pain 07/15/2016   Chronic venous insufficiency 07/15/2016   Swelling of limb 07/15/2016   Type 2 diabetes mellitus (HCC) 07/15/2016   Hyperlipidemia 07/15/2016   ONSET DATE: 07/13/2023  REFERRING DIAG: Acute ischemic R MCA CVA  THERAPY DIAG: muscle weakness (generalized), other lack of coordination, vision disturbance, hemiplegia and hemiparesis following cerebral infarction affecting left non-dominant side (HCC)  Rationale for Evaluation and Treatment: Rehabilitation  SUBJECTIVE:  SUBJECTIVE STATEMENT: Pt. Was approved for additional visits. Pt accompanied by: [name]  PERTINENT HISTORY: Pt. Was admitted to [facility] on 07/13/2023 after sustaining a CVA while on a trip to the beach. Pt. Was diagnosed with R MCA CVA. Pt. Was admitted to inpatient rehab from 07/19/2023-08/13/2023. Past medical history includes high BP, anemia, and Myasthenia Gravis.   PRECAUTIONS: None  WEIGHT BEARING RESTRICTIONS: No  PAIN:  11/14/23: No pain 11/07/23: mild pain in L hand digits 11/05/23: mild pain in L hand digits 10/31/23: No pain, however reports  discomfort in the left forearm with 4th, and 5th digit MP, and PIP extension.  10/29/23: No report of pain 10/24/2023: No pain during treatment session, pt. Reported having pain in L dorsal forearm when using walker splint at home.  10/22/2023: No pain.  10/14/2023: No pain 10/07/23: 4/10 pain L shoulder Are you having pain?  10/01/2023- Yes, 7/10 in L shoulder due to most recent fall that occurred this past Friday. 5/10 L wrist  FALLS: Has patient fallen in last 6 months? Yes  LIVING ENVIRONMENT: Lives with: lives with their family and lives with their spouse-  Lives in: House/apartment- 2 story home and resides mostly on the 1st floor Stairs: No- Ramped entrance Has following equipment at home:  Vannie, cane, Steadi, shower chair, handheld shower head, bedside commode  PLOF: Independent and  Independent with basic ADLs Independent at home   PATIENT GOALS: Would like to walk 10-20 feet each time.   OBJECTIVE:  Note: Objective measures were completed at Evaluation unless otherwise noted.  HAND DOMINANCE: Right  ADLs: Overall ADLs:  Transfers/ambulation related to ADLs: Eating: Independent, 100% R handed Grooming: independent UB Dressing: Can put on shirt independently LB Dressing: Difficulty with pants and requires assistance getting the pants on his feet, has difficulty putting on shoes and socks Toileting: Tot A for toilet hygiene. Uses Steadi during toilet transfers. Bathing: Requires assist with using the L arm to wash the R arm. Tub Shower transfers: Roll in shower, built in shower chair, grab bars and hand held shower head.  IADLs: Shopping: Total A (Wife typically did the shopping prior to onset)   Light housekeeping: Total A (wife typically performs prior to onset) Meal Prep: Independent with light snack prep Community mobility: No driving, able to get in/out of the car Medication management: Set up assistance from his wife using a pill box (pt. Is responsible for taking medications at the correct time) Financial management: family members check behind him. Handwriting: TBD Hobbies: gardening, wood working and sports- [employer] Work history: Owns a tax business MOBILITY STATUS: Needs Assist:    POSTURE COMMENTS:   Sitting balance: Good supported sitting balance  FUNCTIONAL OUTCOME MEASURES: TBD  UPPER EXTREMITY ROM:    Active ROM Right Eval  [facility] Right 09/23/2023 [facility] Left eval Left 09/23/2023 Left 10/29/23  Shoulder flexion   89(110) 109(114) 111(115)  Shoulder abduction   98(120) 109(120) 111(122)  Shoulder adduction       Shoulder extension       Shoulder internal rotation       Shoulder external rotation       Elbow flexion   120(140) 130(136) 141(145)  Elbow extension   -28(-10) -5(0) -18(0)  Wrist flexion   60(60) Rest at 60 50(65) 54(60)   Wrist extension   -48(42) 10(40) 20(50)  Wrist ulnar deviation     12(22)  Wrist radial deviation     10(20)  Wrist pronation     (82)90  Wrist supination     64(74)  (Blank rows = not tested)  Eval: L Digit flexion: 2nd:3cm (0cm) 3rd: 0cm (0cm), 4th: 0cm (0cm), 5th: 2cm (0cm)  09/23/23:  L Digit flexion to [facility]: 2nd: 3.5cm (0cm), 3rd: 4 cm(0cm), 4th 3.5cm (0cm), 5th: 2cm (0cm)  Eval:  L Digit extension: Active Gross digit extension through 10% of ROM  09/23/23:  L Digit extension: Active Gross digit extension through 50% of ROM  10/29/23:  L Digit extension: Active Gross digit extension through 25-50% of ROM  UPPER EXTREMITY MMT:     MMT Right eval Left eval Left 09/23/2023 Left 10/29/23  Shoulder flexion 5 3- 3-/5 3-/5  Shoulder abduction 5 3- 3-/5 3-/5  Shoulder adduction      Shoulder extension      Shoulder internal  rotation      Shoulder external rotation      Middle trapezius      Lower trapezius      Elbow flexion 5 TBD 3+/5 4-/5  Elbow extension 5 TBD 4-/5 3-/5  Wrist flexion   2/5 3-/5  Wrist extension  2- 2/5 2+/5  Wrist ulnar deviation      Wrist radial deviation      Wrist pronation    3/5  Wrist supination    3-/5  (Blank rows = not tested)  HAND FUNCTION:  Eval:  Grip strength: Right: 94#, Left: 1#,   Lateral Key Pinch strength: Right: 14#, Left: 3#  10/29/23:   Grip strength: Left: 16#  Lateral Key Pinch strength: Left: 5#  COORDINATION:  Box and Blocks: Right: 34 blocks completed in 1 min., Left: 0 blocks completed in 1 min.    10/29/23:  Box and Blocks: Right: 34 blocks completed in 1 min., Left: 2 blocks completed in 1 min.  9 Hole Peg Test:  Left: To remove 9 pegs from a vertical position: 1 min. &13 sec.  SENSATION: Sensation feels different in both hands.  L hand Light touch- impaired L hand Proprioception- impaired  EDEMA: Has had hx of edema, and came to Eval session with swelling in the L hand/wrist/forearm.    *pt. Has a laceration from his dog on the dorsal aspect of the L hand.   MUSCLE TONE: Increased tone through the UE and hand flexors.   COGNITION: Overall cognitive status: Within functional limits for tasks assessed  VISION: Subjective report: L sided inattention Baseline vision: Wears glasses for reading only Visual history: Right visual occlusion  VISION ASSESSMENT: To be assessed  PERCEPTION: TBD  PRAXIS: Impaired                                                                                                                 TREATMENT DATE: 11/14/2023  Estim attended: Tx time: 10 min, 34-36 mA CC, duty cycle 50%, ramp 5 sec, cycle time 10/10; electrodes placed at the left proximal and distal dorsal forearm to promote wrist and digit extension  in preparation for grasp/releasing of objects.  Therapeutic Activity:  -facilitated left hand function needed for securing a grasp onto  1.2 medication bottles, twisting to open them, supinating the forearm to look at the bottle top in his hand, then  controlling the movements needed to  release his digits in order to place the lid to the left on the table. Pt. Then worked on grasping the lid from the table top surface with the left hand, and reconnecting, and screwing the lid onto the bottle  while stabilized with the right hand.  Pt. Required a piece of Dycem placed on the tabletop surface, as well as cues to avoid using the right hand to unscrew the base.  Therapeutic Exercise:  -AROM/AAROM/PROM for the LUE including left shoulder flexion, abduction, horizontal abduction, elbow flexion, extension, forearm supination, pronation, wrist extension, digit MP/PIP/and DIP extension to increase ROM,  and decrease stiffness. -Emphasis was placed on 4th, and 5th digit MP, PIP, and DIP extension- No pain in the left forearm when extending 4th, and 5th digit MPs with wrist extension.  PATIENT EDUCATION: Education details:  Vivistim, HEP Person  educated: Patient and spouse Education method: Explanation and Verbal cues Education comprehension: verbalized understanding and needs further education  HOME EXERCISE PROGRAM: -grasping/releasing tennis ball sized circular sphere, holding while performing wrist extension -Repetitions for grasping/releasing 1 blocks.  GOALS: Goals reviewed with patient? Yes  SHORT TERM GOALS: Target date:.   Pt. Will be independent with HEP for the LUE.  Baseline: 10/29/23: Pt. requires assist from wife with HEPs. Pt. Is consistently working on HEP's at home. 09/19/23: Requires assist from his wife Eval; no current HEP Goal status: Ongoing  LONG TERM GOALS: Target date: 01/21/2024  Pt. Will increase L shoulder flexion by 10 degrees to be able to reach up to shelves.  Baseline: 10/29/23: 888(884) 09/19/23: Pt. Continues to be limited with reaching up to shelves. Eval: L shoulder flexion is 89 (110). Goal status: Ongoing  2.  Pt. Will increase L shoulder abduction by 10 degrees to assist with underarm hygiene.  Baseline:10/29/23: 888(877) 09/19/23: Pt. Continues to require assist with underarm hygiene.Eval: L shoulder abduction is 98 (120).  Goal status: Ongoing  3.  Pt. Will improve L wrist extension by 10 degrees to be able to initiate anticipation of grasping for objects.  Baseline: 10/29/23: 20(55) 09/19/23: Consistent activation noted in the left wrist extensors with facilitation. Eval: -48 (42) Goal status: Ongoing  4.  Pt. Will improve active gross digit extension through 50% of the range to be able to release objects in his hand consistently.  Baseline: 10/28/20: Gross digit extension through 25-50% of the range. 09/19/23: gross digit extension noted through 25% range with facilitation.Eval: gross digit extension is 10% of the range  Goal status: Goal updated to 50% of the range  5.  Pt. Will independently demonstrate visual compensatory strategies when navigating through environment and during  tabletop tasks. Baseline: 10/29/2023: Pt. Continues to less cuing for left sided awareness. 09/19/23: Pt. requires fewer cues for left sided awareness Eval: Pt. Requires consistent cuing for L sided awareness.  Goal status: Ongoing   6. Pt. Will improve left hand [facility] skills to be able to manipulate small objects during ADLs, and IADLs.   Baseline: 10/29/23: 9 Hole Peg Test: Pt. Was able to remove 9 pegs from vertical position on the pegboard in 1 min. & 13 sec. Pt. Is not yet able to pick up pegs from a horizontal position to place them in the pegboard.   Goal status: New   7. Pt. Will improve left hand function skills as evidence by improve score to 4 blocks on the Blocks, and Box test.   Baseline: 10/29/23: Box and Blocks Test: 2 Blocks completed in 1 min.   Goal Status: New  ASSESSMENT:  CLINICAL IMPRESSION:   Pt. Was approved for 8 additional visits.  Pt. Has progressed this session with using his right hand to unscrew a 1.5 childproof medication bottle lid using a gross grasp. Pt. Required cues, and assist for formulating a lateral grasp to open the lid.  Pt. Presents  with improving active digit extension this morning when preparing the hand for grasping, as well as when attempting to release the items. Pt. Presents with no pain in the forearm with 4th, and 5th digit extension. Pt continues to benefit from OT services to work on improving  LUE functioning to increase engagement of the LUE during daily task, and to provide education about compensatory strategies during ADLs/IADLs.  PERFORMANCE DEFICITS: in functional skills including ADLs, IADLs, coordination, dexterity, proprioception, sensation, edema, tone, ROM, strength, pain, Fine motor control, Gross motor control, mobility, balance, endurance, vision, and UE functional use, cognitive skills including perception, and psychosocial skills including coping strategies, environmental adaptation, and routines and behaviors.   IMPAIRMENTS: are  limiting patient from ADLs, IADLs, and leisure.   CO-MORBIDITIES: may have co-morbidities  that affects occupational performance. Patient will benefit from skilled OT to address above impairments and improve overall function.  MODIFICATION OR ASSISTANCE TO COMPLETE EVALUATION: Min-Moderate modification of tasks or assist with assess necessary to complete an evaluation.  OT OCCUPATIONAL PROFILE AND HISTORY: Detailed assessment: Review of records and additional review of physical, cognitive, psychosocial history related to current functional performance.  CLINICAL DECISION MAKING: Moderate - several treatment options, min-mod task modification necessary  REHAB POTENTIAL: Good  EVALUATION COMPLEXITY: Moderate  PLAN:  OT FREQUENCY: 2x/week  OT DURATION: 12 weeks  PLANNED INTERVENTIONS: 97168 OT Re-evaluation, 97535 self care/ADL training, 02889 therapeutic exercise, 97530 therapeutic activity, 97112 neuromuscular re-education, 97140 manual therapy, 97018 paraffin, 02989 moist heat, 97010 cryotherapy, 97034 contrast bath, 97760 Orthotic Initial, 97763 Orthotic/Prosthetic subsequent, passive range of motion, visual/perceptual remediation/compensation, energy conservation, and DME and/or AE instructions  RECOMMENDED OTHER SERVICES: PT and ST  CONSULTED AND AGREED WITH PLAN OF CARE: Patient and family member/caregiver  PLAN FOR NEXT SESSION: see above  Richardson Otter, MS, OTR/L   11/14/23

## 2023-11-19 ENCOUNTER — Ambulatory Visit: Admitting: Physical Therapy

## 2023-11-19 ENCOUNTER — Encounter: Admitting: Speech Pathology

## 2023-11-19 ENCOUNTER — Ambulatory Visit

## 2023-11-19 DIAGNOSIS — M6281 Muscle weakness (generalized): Secondary | ICD-10-CM

## 2023-11-19 DIAGNOSIS — R278 Other lack of coordination: Secondary | ICD-10-CM

## 2023-11-19 DIAGNOSIS — R262 Difficulty in walking, not elsewhere classified: Secondary | ICD-10-CM

## 2023-11-19 DIAGNOSIS — R269 Unspecified abnormalities of gait and mobility: Secondary | ICD-10-CM

## 2023-11-19 DIAGNOSIS — I69354 Hemiplegia and hemiparesis following cerebral infarction affecting left non-dominant side: Secondary | ICD-10-CM

## 2023-11-19 DIAGNOSIS — R2681 Unsteadiness on feet: Secondary | ICD-10-CM

## 2023-11-19 NOTE — Therapy (Signed)
 OUTPATIENT PHYSICAL THERAPY TREATMENT  Patient Name: [name]. MRN: [id number] DOB:Feb 25, 1951, 73 y.o., male Today's Date: 11/19/2023 PCP: Dennise Remak, MD  REFERRING PROVIDER:    Pegge Toribio PARAS, PA-C    END OF SESSION:    PT End of Session - 11/19/23 1535     Visit Number 28    Number of Visits 40    Date for PT Re-Evaluation 12/31/23    Authorization Type [employer] Medicare    Authorization Time Period [facility] auth#: [id number] for 6 PT vsts from 8/21-9/18 (8/26 is 2 of 6)    Progress Note Due on Visit 30    PT Start Time 1537    PT Stop Time 1616    PT Time Calculation (min) 39 min    Equipment Utilized During Treatment Gait belt    Activity Tolerance Patient tolerated treatment well;No increased pain    Behavior During Therapy WFL for tasks assessed/performed              Past Medical History:  Diagnosis Date   Anemia    Cervical myelopathy (HCC)    Chronic venous insufficiency    [name] (chronic kidney disease), stage III (HCC)    Diabetes mellitus without complication (HCC)    GERD (gastroesophageal reflux disease)    Hypercholesteremia    Hypothyroidism    Kidney stones    about 50 in the past--last in March 4/24   Leg weakness, bilateral    due to Myasthenia Gravis   Myasthenia gravis (HCC)    Spinal stenosis    Vitamin B 12 deficiency    Vitamin B 12 deficiency    Wears hearing aid    bilateral   Past Surgical History:  Procedure Laterality Date   BACK SURGERY  09/2015   CARDIAC CATHETERIZATION     CATARACT EXTRACTION W/ INTRAOCULAR LENS IMPLANT     CATARACT EXTRACTION W/PHACO Right 12/21/2015   Procedure: CATARACT EXTRACTION PHACO AND INTRAOCULAR LENS PLACEMENT (IOC);  Surgeon: Dene Etienne, MD;  Location: [facility];  Service: Ophthalmology;  Laterality: Right;  DIABETIC - insulin  and oral meds   COLONOSCOPY     SHOULDER SURGERY     labrum repair   TONSILLECTOMY     Patient Active Problem List   Diagnosis Date Noted    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) 10/11/2023   High blood pressure 08/13/2023   Anemia 08/13/2023   Constipation 08/13/2023   Myasthenia gravis (HCC) 08/13/2023   Acute ischemic right MCA stroke (HCC) 07/19/2023   Lymphedema 12/27/2016   Leg pain 07/15/2016   Chronic venous insufficiency 07/15/2016   Swelling of limb 07/15/2016   Type 2 diabetes mellitus (HCC) 07/15/2016   Hyperlipidemia 07/15/2016    ONSET DATE: 07/19/23  REFERRING DIAG:  Diagnosis  I63.511 (ICD-10-CM) - Cerebral infarction due to unspecified occlusion or stenosis of right middle cerebral artery    THERAPY DIAG:  Hemiplegia and hemiparesis following cerebral infarction affecting left non-dominant side (HCC)  Muscle weakness (generalized)  Other lack of coordination  Difficulty in walking, not elsewhere classified  Unsteadiness on feet  Abnormality of gait and mobility  Rationale for Evaluation and Treatment: Rehabilitation  SUBJECTIVE:  SUBJECTIVE STATEMENT:   Pt reports his follow-up with [facility] regarding L SKC (Swedish Knee Cage) is next Wednesday 9/3. Patient has follow-up with wound clinic tomorrow regarding places on L LE. Patient and wife state they haven't walked at home since last therapy session because they haven't had a 2nd person to provide patient assistance. Patient states he has been doing his transfers. Patient and wife report no negative side effects from wearing his [facility] during last session. Patient reports otherwise no updates since last session.   PERTINENT HISTORY:  CVA with Left hemiplegia in April 2025. Pt started on a swedish knee cage at CIR due to uncontrolled hyperextension in stance. Pt had not been moving much since discharging from [facility], as  therapists instructed pt to perform transfers only at home due to high fall risk with ambulation. Continues to have significant weakenss in the LUE with difficulty extending fingers as well as numbness and inattention to the L side of body resulting in multiple cuts on Arm and leg with WC mobility in home. Will be attaining custom power WC from [employer]. Has loaner currently.   PAIN:  Are you having pain? No  PRECAUTIONS: Fall, history of wounds on LLE   *Latex allergy   WEIGHT BEARING RESTRICTIONS: No  FALLS: Has patient fallen in last 6 months? Yes. Number of falls 1  LIVING ENVIRONMENT: Lives with: lives with their spouse Lives in: House/apartment Stairs: Ramps installed while in the Hospital  Has following equipment at home: Vannie - 2 wheeled and Wheelchair (power)  PLOF: Independent with basic ADLs, Independent with household mobility without device, and Independent with community mobility with device with [facility]   PATIENT GOALS: relearn to Walk 15-90ft.   OBJECTIVE:                                                                                                                              TREATMENT DATE: 11/19/2023  Pt arrives in transport chair.   Donned pt's SKC for session since it was stretched by [facility] last week and pt with no adverse effects from wearing it during last session - continues to be too tight around his lower leg; however, pt's gait mechanics are better with the [facility] compared to the hinged knee brace during session. Doffed SKC at end of session.  Sit>stand transport chair>bari-RW with L hand splint and skilled min A. Throughout session, pt is able to independently manage L hand placement on/off RW hand splint without cuing.  Reinforced cuing throughout session during sit>stand transfers to ensure adequate anterior trunk lean/weight shift in order to improve ability to come to standing without posterior lean/LOB bias.  Gait training 263ft using bari-RW  with L hand splint and wearing L SKC with constant skilled min assist for balance. Pt demonstrating the following gait deviations with therapist providing the described cuing and facilitation for improvement:  Pt achieves reciprocal stepping pattern >85% of the gait distance today, but has  he fatigues he will starts to lack terminal lower leg advancement via knee extension during swing to achieve heel strike, but with cuing is able to correct  Pt does have heavy L foot landing on initial contact when he achieves terminal swing advancement Significantly decreased gait speed with increased R stance time in order to allow L swing advancement Improving ability to maintain upright posture/forward gaze, which improves L hip extension and forward pelvis progression over L stance phase Pt with a few instances of minor imbalance, but pt demos improving motor planning to execute appropriate balance recovery strategies to remain upright, with anticipatory skilled min A from therapist Patient continues to be able to increase gait distance and during straight pathways is able to increase fluidity, and symmetry of stance times with increased speed of L swing limb advancement  Dynamic gait training using bari-RW as follows: Cone navigation (around 3 cones) to promote turning and full 180degree turns around the cone with goal of performing more tight turns 3x each direction to work on both L and R 180 degree turns Skilled min A throughout for balance During R turns - cuing for pt to twist/rotate his hips towards the R when turning, but keep LLE on it's side of the RW to avoid excessive adduction Patient does well managing the AD throughout with intermittent min cuing  Short distance ~45ft ambulatory transfer navigating through the 3 cones to get to the transport chair using bari-RW, requiring transfer towards seat on his L side, with skilled light min A for balance - pt does excellent job turning, stepping feet  as needed, and managing AD to maintain balance with only min cuing to step L LE back fully.   PATIENT EDUCATION: Education details: knee cage needs assessmnet as it is causeing crossover gait.  Person educated: Patient and Spouse Education method: Explanation, VC/TC/Demo Education comprehension: verbalized understanding  HOME EXERCISE PROGRAM: Access Code: [email or web address] URL: [email or web address] Date: 09/11/2023 Prepared by: Massie Dollar  Exercises - Seated Knee Extension AROM  - 1 x daily - 4 x weekly - 3 sets - 10 reps - Seated Hip Abduction  - 1 x daily - 4 x weekly - 3 sets - 10 reps - Seated March  - 1 x daily - 4 x weekly - 3 sets - 10 reps - Sit to Stand with Counter Support  - 1 x daily - 4 x weekly - 3 sets - 10 reps - Supine Short Arc Quad  - 1 x daily - 4 x weekly - 3 sets - 10 reps - Supine Hip Abduction  - 1 x daily - 4 x weekly - 3 sets - 10 reps - Supine Bridge  - 1 x daily - 4 x weekly - 3 sets - 3 reps - 2sec  hold  GOALS: Goals reviewed with patient? Yes  SHORT TERM GOALS: Target date: 12/12/2023  Patient will be independent in home exercise program to improve strength/mobility for better functional independence with ADLs. Baseline: Initiated on 09/11/2023 10/22/2023: will update when appropriate Goal status: IN PROGRESS  LONG TERM GOALS: Target date: 12/31/2023  Patient will increase SIS-16 score to equal to or greater than 10points to demonstrate statistically significant improvement in mobility and quality of life.  Baseline: 49/80 09/19/2023: 45/80 10/22/2023: 46/80 Goal status: IN PROGRESS  2.  Patient (> 39 years old) will complete five times sit to stand test in < 15 seconds indicating an increased LE strength and improved balance. Baseline: 1:3min 09/19/2023: 56.39 seconds using R  UE support to push-up from armrest of green chair and requiring skilled min A for balance and lifting/lowering  10/22/2023: 27.10 seconds using R UE support to  push-up from armrest  8/12: 27.7 sec with RUE pushing from arm rest.  Goal status: IN PROGRESS  3.  Patient will increase FIST score by > 6 points to demonstrate decreased fall risk during functional activities Baseline:  43/56  09/23/2023: 53/56 Goal status: MET and UPGRADED  09/23/2023: Patient will increase Berg Balance score to > 45/56 to demonstrate improved balance and decreased fall risk during functional activities and ADLs.  Baseline: 09/23/2023: 14/56 7/31: 20 Goal status: IN PROGRESS  4.  Patient will increase 10 meter walk test to >0.63m/s as to improve gait speed for better community ambulation and to reduce fall risk. Baseline: 09/09/2023: 0.103 m/s ( and 37 seconds) using bari-RW with skilled heavy min A and +2 w/c follow for safety  09/19/2023: 0.73m/s using bari-RW with skilled min A of 1 and +2 w/c follow for safety 10/22/2023: 0.1875 m/s using bari-RW with L hand splint, L swedish knee cage, and only skilled light min A of 1 (no wheelchair follow)  8/12: 0.153m/s with RW, and L hand splint. No knee cage on this day.  Goal status: IN PROGRESS  5.  Patient will reduce timed up and go to <11 seconds to reduce fall risk and demonstrate improved transfer/gait ability. Baseline: unable to complete turn at 10 ft. Mod-max assist due to poor control of LW on RW, resulting in L lateral LOB  09/09/2023: and 32 seconds using bari-RW with L hand splint and +2 on R side for safety, therapist providing skilled min A on L side (Had pt set-up L hand on RW splint, get L foot in proper positioning, and scoot forward in seat before starting the timer) 09/19/2023: 75min43 seconds using bari-RW with only skilled min A of 1 (Had pt set-up L hand on RW splint, get L foot in proper positioning, and scoot forward in seat before starting the timer) 7/2: 78sec(1:18.125) with RW and min assist overall.  10/22/2023: 1 min, 30 seconds using bari-RW with L hand splint and skilled light min assist  8/12:  1:01 min with RW and L hand splint.  Goal status: IN PROGRESS   ASSESSMENT:  CLINICAL IMPRESSION:   Patient arrives highly motivated to participate in therapy session and increase his independence with functional mobility at an ambulatory level. Patient has follow-up with [facility] next Wednesday 9/3 where they ordered him an XL SKC. Therapy session continued focus on increased intensity of gait training with patient ambulating 228ft! without a seated rest break today and with only constant skilled min A of 1 person! Patient also participated in dynamic gait of weaving through cones and performing both R and L 180degree turns using bari-RW with anticipatory skilled min A to maintain balance. Patient will benefit from continued progression of dynamic gait training including lateral stepping, backwards, and obstacle navigation all while focusing on promoting higher intensity gait training to promote improved motor plan for more consistent L LE foot placement during swing advancement, given impaired sensation. Mr. Mentink will benefit from further skilled PT to improve these deficits in order to increase QOL, decrease fall risk, increase independence with functional mobility, and increase independence with ADLs.    OBJECTIVE IMPAIRMENTS: Abnormal gait, cardiopulmonary status limiting activity, decreased activity tolerance, decreased balance, decreased cognition, decreased coordination, decreased endurance, decreased knowledge of condition, decreased knowledge of use of DME, decreased mobility,  difficulty walking, decreased ROM, decreased strength, decreased safety awareness, dizziness, hypomobility, increased fascial restrictions, impaired perceived functional ability, increased muscle spasms, impaired sensation, impaired tone, impaired UE functional use, impaired vision/preception, improper body mechanics, postural dysfunction, and obesity.   ACTIVITY LIMITATIONS: carrying, lifting, bending, sitting,  standing, squatting, stairs, transfers, bed mobility, bathing, toileting, dressing, reach over head, hygiene/grooming, locomotion level, and caring for others  PARTICIPATION LIMITATIONS: meal prep, cleaning, laundry, medication management, interpersonal relationship, driving, shopping, community activity, and yard work  PERSONAL FACTORS: Age, Past/current experiences, Time since onset of injury/illness/exacerbation, and 3+ comorbidities: MG, HTN, CVA, lymphedema are also affecting patient's functional outcome.   REHAB POTENTIAL: Good  CLINICAL DECISION MAKING: Unstable/unpredictable  EVALUATION COMPLEXITY: High  PLAN:  PT FREQUENCY: 1-2x/week  PT DURATION: 8 weeks  PLANNED INTERVENTIONS: 97164- PT Re-evaluation, 97750- Physical Performance Testing, 97110-Therapeutic exercises, 97530- Therapeutic activity, [id number]- Neuromuscular re-education, 97535- Self Care, [id number]- Manual therapy, [id number]- Gait training, [id number]- Orthotic Initial, [phone]- Orthotic/Prosthetic subsequent, [phone]- Canalith repositioning, [id number]- Electrical stimulation (unattended), [phone]- Electrical stimulation (manual), [id number]- Wound care (first 20 sq cm), 97598- Wound care (each additional 20 sq cm), Patient/Family education, Balance training, Stair training, Taping, Dry Needling, Joint mobilization, Joint manipulation, Vestibular training, Visual/preceptual remediation/compensation, Cognitive remediation, DME instructions, Wheelchair mobility training, Cryotherapy, and Moist heat  PLAN FOR NEXT SESSION:  HIGT (high intensity gait training) using litegait harness either on treadmill vs overground Dynamic gait training using bari-RW vs litegait Backwards Side stepping Obstacle navigation Working on forward propulsion with increased L hip extension and forward progression of pelvis of L stance phase    Fraidy Mccarrick, PT, DPT, NCS, CSRS Physical Therapist - [place]  [facility]  4:31 PM 11/19/23

## 2023-11-20 ENCOUNTER — Encounter: Attending: Internal Medicine | Admitting: Internal Medicine

## 2023-11-20 DIAGNOSIS — L97821 Non-pressure chronic ulcer of other part of left lower leg limited to breakdown of skin: Secondary | ICD-10-CM | POA: Insufficient documentation

## 2023-11-20 DIAGNOSIS — L97811 Non-pressure chronic ulcer of other part of right lower leg limited to breakdown of skin: Secondary | ICD-10-CM | POA: Diagnosis not present

## 2023-11-20 DIAGNOSIS — E11622 Type 2 diabetes mellitus with other skin ulcer: Secondary | ICD-10-CM | POA: Insufficient documentation

## 2023-11-20 DIAGNOSIS — I890 Lymphedema, not elsewhere classified: Secondary | ICD-10-CM | POA: Diagnosis present

## 2023-11-21 ENCOUNTER — Ambulatory Visit: Admitting: Physical Therapy

## 2023-11-21 ENCOUNTER — Encounter: Admitting: Speech Pathology

## 2023-11-21 ENCOUNTER — Ambulatory Visit

## 2023-11-21 DIAGNOSIS — M6281 Muscle weakness (generalized): Secondary | ICD-10-CM | POA: Diagnosis not present

## 2023-11-21 DIAGNOSIS — R278 Other lack of coordination: Secondary | ICD-10-CM

## 2023-11-21 DIAGNOSIS — R2681 Unsteadiness on feet: Secondary | ICD-10-CM

## 2023-11-21 DIAGNOSIS — I69354 Hemiplegia and hemiparesis following cerebral infarction affecting left non-dominant side: Secondary | ICD-10-CM

## 2023-11-21 DIAGNOSIS — R269 Unspecified abnormalities of gait and mobility: Secondary | ICD-10-CM

## 2023-11-21 DIAGNOSIS — R262 Difficulty in walking, not elsewhere classified: Secondary | ICD-10-CM

## 2023-11-21 NOTE — Therapy (Signed)
 Occupational Therapy Neuro Treatment Note  Patient Name: [name]. MRN: [id number] DOB:Sep 20, 1950, 73 y.o., male Today's Date: 11/21/2023  PCP: Alm Rower, MD REFERRING PROVIDER: Hermann Toribio PARAS, PA-C  END OF SESSION:  OT End of Session - 11/21/23 0945     Visit Number 26    Number of Visits 48    Date for OT Re-Evaluation 01/21/24    OT Start Time 1445    OT Stop Time 1530    OT Time Calculation (min) 45 min    Activity Tolerance Patient tolerated treatment well    Behavior During Therapy WFL for tasks assessed/performed         Past Medical History:  Diagnosis Date   Anemia    Cervical myelopathy (HCC)    Chronic venous insufficiency    [name] (chronic kidney disease), stage III (HCC)    Diabetes mellitus without complication (HCC)    GERD (gastroesophageal reflux disease)    Hypercholesteremia    Hypothyroidism    Kidney stones    about 50 in the past--last in March 4/24   Leg weakness, bilateral    due to Myasthenia Gravis   Myasthenia gravis (HCC)    Spinal stenosis    Vitamin B 12 deficiency    Vitamin B 12 deficiency    Wears hearing aid    bilateral   Past Surgical History:  Procedure Laterality Date   BACK SURGERY  09/2015   CARDIAC CATHETERIZATION     CATARACT EXTRACTION W/ INTRAOCULAR LENS IMPLANT     CATARACT EXTRACTION W/PHACO Right 12/21/2015   Procedure: CATARACT EXTRACTION PHACO AND INTRAOCULAR LENS PLACEMENT (IOC);  Surgeon: Dene Etienne, MD;  Location: [facility];  Service: Ophthalmology;  Laterality: Right;  DIABETIC - insulin  and oral meds   COLONOSCOPY     SHOULDER SURGERY     labrum repair   TONSILLECTOMY     Patient Active Problem List   Diagnosis Date Noted   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) 10/11/2023   High blood pressure 08/13/2023   Anemia 08/13/2023   Constipation 08/13/2023   Myasthenia gravis (HCC) 08/13/2023   Acute ischemic right MCA stroke  (HCC) 07/19/2023   Lymphedema 12/27/2016   Leg pain 07/15/2016   Chronic venous insufficiency 07/15/2016   Swelling of limb 07/15/2016   Type 2 diabetes mellitus (HCC) 07/15/2016   Hyperlipidemia 07/15/2016   ONSET DATE: 07/13/2023  REFERRING DIAG: Acute ischemic R MCA CVA  THERAPY DIAG: muscle weakness (generalized), other lack of coordination, vision disturbance, hemiplegia and hemiparesis following cerebral infarction affecting left non-dominant side (HCC)  Rationale for Evaluation and Treatment: Rehabilitation  SUBJECTIVE:  SUBJECTIVE STATEMENT: Pt reports he has an upcoming appt at the wound clinic to tx wounds on LEs that have not been healing properly.   Pt accompanied by: [name]  PERTINENT HISTORY: Pt. Was admitted to [facility] on 07/13/2023 after sustaining a CVA while on a trip to the beach. Pt. Was diagnosed with R MCA CVA. Pt. Was admitted to inpatient rehab from 07/19/2023-08/13/2023. Past medical history includes high BP, anemia, and Myasthenia Gravis.   PRECAUTIONS: None  WEIGHT BEARING RESTRICTIONS: No  PAIN:  11/19/23: Mild L shoulder pain/stiffness, volar forearm, wrist and digits with end range stretching  FALLS: Has patient fallen in last 6 months? Yes  LIVING ENVIRONMENT: Lives with: lives with their family and lives with their spouse-  Lives in: House/apartment- 2 story home and resides mostly on the 1st floor  Stairs: No- Ramped entrance Has following equipment at home:  Vannie, cane, Steadi, shower chair, handheld shower head, bedside commode  PLOF: Independent and Independent with basic ADLs Independent at home   PATIENT GOALS: Would like to walk 10-20 feet each time.   OBJECTIVE:  Note: Objective measures were completed at Evaluation unless otherwise noted.  HAND DOMINANCE: Right  ADLs: Overall ADLs:  Transfers/ambulation related to ADLs: Eating: Independent, 100% R handed Grooming: independent UB Dressing: Can put on shirt independently LB  Dressing: Difficulty with pants and requires assistance getting the pants on his feet, has difficulty putting on shoes and socks Toileting: Tot A for toilet hygiene. Uses Steadi during toilet transfers. Bathing: Requires assist with using the L arm to wash the R arm. Tub Shower transfers: Roll in shower, built in shower chair, grab bars and hand held shower head.  IADLs: Shopping: Total A (Wife typically did the shopping prior to onset)   Light housekeeping: Total A (wife typically performs prior to onset) Meal Prep: Independent with light snack prep Community mobility: No driving, able to get in/out of the car Medication management: Set up assistance from his wife using a pill box (pt. Is responsible for taking medications at the correct time) Financial management: family members check behind him. Handwriting: TBD Hobbies: gardening, wood working and sports- [employer] Work history: Owns a tax business MOBILITY STATUS: Needs Assist:    POSTURE COMMENTS:   Sitting balance: Good supported sitting balance  FUNCTIONAL OUTCOME MEASURES: TBD  UPPER EXTREMITY ROM:    Active ROM Right Eval  [facility] Right 09/23/2023 [facility] Left eval Left 09/23/2023 Left 10/29/23  Shoulder flexion   89(110) 109(114) 111(115)  Shoulder abduction   98(120) 109(120) 111(122)  Shoulder adduction       Shoulder extension       Shoulder internal rotation       Shoulder external rotation       Elbow flexion   120(140) 130(136) 141(145)  Elbow extension   -28(-10) -5(0) -18(0)  Wrist flexion   60(60) Rest at 60 50(65) 54(60)  Wrist extension   -48(42) 10(40) 20(50)  Wrist ulnar deviation     12(22)  Wrist radial deviation     10(20)  Wrist pronation     (82)90  Wrist supination     64(74)  (Blank rows = not tested)  Eval: L Digit flexion: 2nd:3cm (0cm) 3rd: 0cm (0cm), 4th: 0cm (0cm), 5th: 2cm (0cm)  09/23/23:  L Digit flexion to [facility]: 2nd: 3.5cm (0cm), 3rd: 4 cm(0cm), 4th 3.5cm (0cm), 5th: 2cm  (0cm)  Eval:  L Digit extension: Active Gross digit extension through 10% of ROM  09/23/23:  L Digit extension: Active Gross digit extension through 50% of ROM  10/29/23:  L Digit extension: Active Gross digit extension through 25-50% of ROM  UPPER EXTREMITY MMT:     MMT Right eval Left eval Left 09/23/2023 Left 10/29/23  Shoulder flexion 5 3- 3-/5 3-/5  Shoulder abduction 5 3- 3-/5 3-/5  Shoulder adduction      Shoulder extension      Shoulder internal rotation      Shoulder external rotation      Middle trapezius      Lower trapezius      Elbow flexion 5 TBD 3+/5 4-/5  Elbow extension 5 TBD 4-/5 3-/5  Wrist flexion   2/5 3-/5  Wrist extension  2- 2/5 2+/5  Wrist ulnar deviation      Wrist radial deviation  Wrist pronation    3/5  Wrist supination    3-/5  (Blank rows = not tested)  HAND FUNCTION:  Eval:  Grip strength: Right: 94#, Left: 1#,   Lateral Key Pinch strength: Right: 14#, Left: 3#  10/29/23:   Grip strength: Left: 16#  Lateral Key Pinch strength: Left: 5#  COORDINATION:  Box and Blocks: Right: 34 blocks completed in 1 min., Left: 0 blocks completed in 1 min.    10/29/23:  Box and Blocks: Right: 34 blocks completed in 1 min., Left: 2 blocks completed in 1 min.  9 Hole Peg Test:  Left: To remove 9 pegs from a vertical position: 1 min. &13 sec.  SENSATION: Sensation feels different in both hands.  L hand Light touch- impaired L hand Proprioception- impaired  EDEMA: Has had hx of edema, and came to Eval session with swelling in the L hand/wrist/forearm.   *pt. Has a laceration from his dog on the dorsal aspect of the L hand.   MUSCLE TONE: Increased tone through the UE and hand flexors.   COGNITION: Overall cognitive status: Within functional limits for tasks assessed  VISION: Subjective report: L sided inattention Baseline vision: Wears glasses for reading only Visual history: Right visual occlusion  VISION ASSESSMENT: To be  assessed  PERCEPTION: TBD  PRAXIS: Impaired                                                                                                                 TREATMENT DATE: 11/19/2023 Estim attended: -2nd trial for Saebostim One, applied to L dorsal forearm to promote L wrist and digit extension in prep for grasping ADL supplies.  Minor positional adjustments made, including assessment of low-high intensity settings, and use of tape over electrodes to improve connectivity with skin, though all strategies elicited minimal contractions to maximize wrist and digit extension.  Will continue to plan use of Estim tower in clinic for future stim use as pt has responded well to this in previous sessions.    Therapeutic Activity: -Promoted LUE forward reaching, pron/sup, and grasp release, working to pour pills from a pill bottle, grasp a pill with OT setting up pill (bead) in pt's hand with pt using a lateral pinch pattern, and pt working to drop pill into bottle.   -Practiced LUE grasp/release and forward flex, abd, and ER, working to grasp a large washer and release at target points into OT 's hand within noted shoulder planes.    Therapeutic Exercise: -Performed AAROM/PROM for the LUE including left shoulder flexion, abduction, ER with elbow at side and with arm abducted to 80*/90*, forearm supination, pronation, wrist extension, digit MP/PIP/and DIP extension to increase ROM, and decrease stiffness for better engagement of the arm into ADLs.    PATIENT EDUCATION: Education details:  Review of self passive stretching  Person educated: Patient and spouse Education method: Explanation and Verbal cues, return demo Education comprehension: verbalized understanding and needs further education  HOME EXERCISE PROGRAM: -grasping/releasing tennis ball sized circular sphere, holding while performing  wrist extension -Repetitions for grasping/releasing 1 blocks.  GOALS: Goals reviewed with patient?  Yes  SHORT TERM GOALS: Target date:.   Pt. Will be independent with HEP for the LUE.  Baseline: 10/29/23: Pt. requires assist from wife with HEPs. Pt. Is consistently working on HEP's at home. 09/19/23: Requires assist from his wife Eval; no current HEP Goal status: Ongoing  LONG TERM GOALS: Target date: 01/21/2024  Pt. Will increase L shoulder flexion by 10 degrees to be able to reach up to shelves.  Baseline: 10/29/23: 888(884) 09/19/23: Pt. Continues to be limited with reaching up to shelves. Eval: L shoulder flexion is 89 (110). Goal status: Ongoing  2.  Pt. Will increase L shoulder abduction by 10 degrees to assist with underarm hygiene.  Baseline:10/29/23: 888(877) 09/19/23: Pt. Continues to require assist with underarm hygiene.Eval: L shoulder abduction is 98 (120).  Goal status: Ongoing  3.  Pt. Will improve L wrist extension by 10 degrees to be able to initiate anticipation of grasping for objects.  Baseline: 10/29/23: 20(55) 09/19/23: Consistent activation noted in the left wrist extensors with facilitation. Eval: -48 (42) Goal status: Ongoing  4.  Pt. Will improve active gross digit extension through 50% of the range to be able to release objects in his hand consistently.  Baseline: 10/28/20: Gross digit extension through 25-50% of the range. 09/19/23: gross digit extension noted through 25% range with facilitation.Eval: gross digit extension is 10% of the range  Goal status: Goal updated to 50% of the range  5.  Pt. Will independently demonstrate visual compensatory strategies when navigating through environment and during tabletop tasks. Baseline: 10/29/2023: Pt. Continues to less cuing for left sided awareness. 09/19/23: Pt. requires fewer cues for left sided awareness Eval: Pt. Requires consistent cuing for L sided awareness.  Goal status: Ongoing   6. Pt. Will improve left hand [facility] skills to be able to manipulate small objects during ADLs, and IADLs.   Baseline: 10/29/23: 9 Hole Peg  Test: Pt. Was able to remove 9 pegs from vertical position on the pegboard in 1 min. & 13 sec. Pt. Is not yet able to pick up pegs from a horizontal position to place them in the pegboard.   Goal status: New   7. Pt. Will improve left hand function skills as evidence by improve score to 4 blocks on the Blocks, and Box test.   Baseline: 10/29/23: Box and Blocks Test: 2 Blocks completed in 1 min.   Goal Status: New  ASSESSMENT: CLINICAL IMPRESSION:  Pt continues to tolerate passive stretching well throughout the LUE, and is demonstrating increased end range with ER as noted by ability to reach to top of head and side of head, working towards reaching the back of his head to begin to engage this arm into washing and brushing hair.  Pt demonstrated improved storage of a large washer in hand during reaching/grasp/release activity noted above, but demonstrates frequent dropping with smaller items (a medium/large bead) d/t weak grasp.  Pt struggles to pick up smaller/flat items from table top d/t limited active digit ext and decreased FMC, but is demonstrating better extension to open hand for OT to place objects into palm or within thumb and lateral aspect of L IF.  Pt continues to benefit from OT services to work on improving LUE functioning to increase engagement of the LUE during daily task, and to provide education about compensatory strategies during ADLs/IADLs.  PERFORMANCE DEFICITS: in functional skills including ADLs, IADLs, coordination, dexterity, proprioception, sensation, edema,  tone, ROM, strength, pain, Fine motor control, Gross motor control, mobility, balance, endurance, vision, and UE functional use, cognitive skills including perception, and psychosocial skills including coping strategies, environmental adaptation, and routines and behaviors.   IMPAIRMENTS: are limiting patient from ADLs, IADLs, and leisure.   CO-MORBIDITIES: may have co-morbidities  that affects occupational performance.  Patient will benefit from skilled OT to address above impairments and improve overall function.  MODIFICATION OR ASSISTANCE TO COMPLETE EVALUATION: Min-Moderate modification of tasks or assist with assess necessary to complete an evaluation.  OT OCCUPATIONAL PROFILE AND HISTORY: Detailed assessment: Review of records and additional review of physical, cognitive, psychosocial history related to current functional performance.  CLINICAL DECISION MAKING: Moderate - several treatment options, min-mod task modification necessary  REHAB POTENTIAL: Good  EVALUATION COMPLEXITY: Moderate  PLAN:  OT FREQUENCY: 2x/week  OT DURATION: 12 weeks  PLANNED INTERVENTIONS: 97168 OT Re-evaluation, 97535 self care/ADL training, 02889 therapeutic exercise, 97530 therapeutic activity, 97112 neuromuscular re-education, 97140 manual therapy, 97018 paraffin, 02989 moist heat, 97010 cryotherapy, 97034 contrast bath, 97760 Orthotic Initial, 97763 Orthotic/Prosthetic subsequent, passive range of motion, visual/perceptual remediation/compensation, energy conservation, and DME and/or AE instructions  RECOMMENDED OTHER SERVICES: PT and ST  CONSULTED AND AGREED WITH PLAN OF CARE: Patient and family member/caregiver  PLAN FOR NEXT SESSION: see above  Inocente Blazing, MS, OTR/L

## 2023-11-21 NOTE — Therapy (Signed)
 OUTPATIENT PHYSICAL THERAPY TREATMENT  Patient Name: [name]. MRN: [id number] DOB:31-Aug-1950, 73 y.o., male Today's Date: 11/21/2023 PCP: Dennise Remak, MD  REFERRING PROVIDER:    Pegge Toribio PARAS, PA-C    END OF SESSION:    PT End of Session - 11/21/23 1402     Visit Number 29    Number of Visits 40    Date for PT Re-Evaluation 12/31/23    Authorization Type [employer] Medicare    Authorization Time Period [facility] auth#: [id number] for 6 PT vsts from 8/21-9/18 (8/26 is 2 of 6)    Progress Note Due on Visit 30    PT Start Time 1402    PT Stop Time 1446    PT Time Calculation (min) 44 min    Equipment Utilized During Treatment Gait belt    Activity Tolerance Patient tolerated treatment well;No increased pain    Behavior During Therapy WFL for tasks assessed/performed           Past Medical History:  Diagnosis Date   Anemia    Cervical myelopathy (HCC)    Chronic venous insufficiency    [name] (chronic kidney disease), stage III (HCC)    Diabetes mellitus without complication (HCC)    GERD (gastroesophageal reflux disease)    Hypercholesteremia    Hypothyroidism    Kidney stones    about 50 in the past--last in March 4/24   Leg weakness, bilateral    due to Myasthenia Gravis   Myasthenia gravis (HCC)    Spinal stenosis    Vitamin B 12 deficiency    Vitamin B 12 deficiency    Wears hearing aid    bilateral   Past Surgical History:  Procedure Laterality Date   BACK SURGERY  09/2015   CARDIAC CATHETERIZATION     CATARACT EXTRACTION W/ INTRAOCULAR LENS IMPLANT     CATARACT EXTRACTION W/PHACO Right 12/21/2015   Procedure: CATARACT EXTRACTION PHACO AND INTRAOCULAR LENS PLACEMENT (IOC);  Surgeon: Dene Etienne, MD;  Location: [facility];  Service: Ophthalmology;  Laterality: Right;  DIABETIC - insulin  and oral meds   COLONOSCOPY     SHOULDER SURGERY     labrum repair   TONSILLECTOMY     Patient Active Problem List   Diagnosis Date Noted    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) 10/11/2023   High blood pressure 08/13/2023   Anemia 08/13/2023   Constipation 08/13/2023   Myasthenia gravis (HCC) 08/13/2023   Acute ischemic right MCA stroke (HCC) 07/19/2023   Lymphedema 12/27/2016   Leg pain 07/15/2016   Chronic venous insufficiency 07/15/2016   Swelling of limb 07/15/2016   Type 2 diabetes mellitus (HCC) 07/15/2016   Hyperlipidemia 07/15/2016    ONSET DATE: 07/19/23  REFERRING DIAG:  Diagnosis  I63.511 (ICD-10-CM) - Cerebral infarction due to unspecified occlusion or stenosis of right middle cerebral artery    THERAPY DIAG:  Muscle weakness (generalized)  Other lack of coordination  Hemiplegia and hemiparesis following cerebral infarction affecting left non-dominant side (HCC)  Difficulty in walking, not elsewhere classified  Unsteadiness on feet  Abnormality of gait and mobility  Rationale for Evaluation and Treatment: Rehabilitation  SUBJECTIVE:  SUBJECTIVE STATEMENT:   Pt/wife report patient had appointment with wound clinic yesterday and it went well with them stating patient has no restrictions with his physical therapy following that. Patient wearing bandages on B LE lower legs from the appointment. Patient and wife report no negative side effects from wearing his SKC again during last session. Patient denies pain/discomfort during session. Denies stumbles/falls since last session. Pt/wife report patient hasn't had an opportunity to walk at home since last session due to no 2nd person present to provide assist. Patient reports otherwise no updates since last session.   PERTINENT HISTORY:  CVA with Left hemiplegia in April 2025. Pt started on a swedish knee cage at CIR due to  uncontrolled hyperextension in stance. Pt had not been moving much since discharging from [facility], as therapists instructed pt to perform transfers only at home due to high fall risk with ambulation. Continues to have significant weakenss in the LUE with difficulty extending fingers as well as numbness and inattention to the L side of body resulting in multiple cuts on Arm and leg with WC mobility in home. Will be attaining custom power WC from [employer]. Has loaner currently.   PAIN:  Are you having pain? No  PRECAUTIONS: Fall, history of wounds on LLE   *Latex allergy   WEIGHT BEARING RESTRICTIONS: No  FALLS: Has patient fallen in last 6 months? Yes. Number of falls 1  LIVING ENVIRONMENT: Lives with: lives with their spouse Lives in: House/apartment Stairs: Ramps [facility] in the [facility]  Has following equipment at home: Vannie - 2 wheeled and Wheelchair (power)  PLOF: Independent with basic ADLs, Independent with household mobility without device, and Independent with community mobility with device with [facility]   PATIENT GOALS: relearn to Walk 15-33ft.   OBJECTIVE:                                                                                                                              TREATMENT DATE: 11/21/2023  Pt arrives in transport chair.   Donned pt's SKC for session since it was stretched by [facility] last week and pt with no adverse effects from wearing it during last session - continues to be too tight around his lower leg; however, pt's gait mechanics are better with the [facility] compared to the hinged knee brace during session. Doffed SKC at end of session.  Patient agreeable to participating in higher intensity gait training on treadmill using litegait harness for safety.   Sit>stand transport chair>bari-RW with L hand splint and skilled min A for balance while coming to stand. Throughout session, pt is able to independently manage L hand placement on/off RW hand splint  without cuing.  Short distance ~40ft x2 ambulatory transfer from transport chair<>green chair using bari-RW with skilled light min A - pt wearing 3lb AW on L LE during gait back to chair at end of session and pt demonstrating overall increased gait speed following treadmill gait  training, compared to at beginning of session.  In standing, with B UE support on litegait handles, donned litegait harness with +2 A and CGA for standing balance.  Stepped on/off treadmill using litegait harness for balance support and B UE support on litegait handles with skilled assistance to manage device and cue patient for sequencing steps - leading with R LE stepping up and L LE when stepping down, backwards off treadmill.    Gait training the following trials on the treadmill in litegait harness providing balance support, but not true BWS, and therapist providing external target cuing to improve L LE step length.  1st trial: 5 min at 0.53mph totaling 254ft Therapist providing cuing via external target for increased L LE step length and for improved L heel strike on initial contact Added 3lb AW to L LE for increased neural recruitment 16sec  at 0.72mph progressed temporarily to 0.7mph and totaling 137ft Had to stop ~3x due to L hand falling off of the litegait handle causing pt to trip over L LE during swing advancement and therapist needing to stop treadmill for patient safety Pt continues to have decreased L LE foot clearance during swing and decreased step length, especially with fatigue, as well as excessive adduction, but pt does primarily achieve reciprocal stepping pattern throughout Pt does continue to have heavy L foot landing on initial contact when he achieves terminal swing advancement Improving symmetry of gait with more equal stance phase times   PATIENT EDUCATION: Education details: knee cage needs assessmnet as it is causeing crossover gait.  Person educated: Patient and Spouse Education  method: Explanation, VC/TC/Demo Education comprehension: verbalized understanding  HOME EXERCISE PROGRAM: Access Code: [email or web address] URL: [email or web address] Date: 09/11/2023 Prepared by: Massie Dollar  Exercises - Seated Knee Extension AROM  - 1 x daily - 4 x weekly - 3 sets - 10 reps - Seated Hip Abduction  - 1 x daily - 4 x weekly - 3 sets - 10 reps - Seated March  - 1 x daily - 4 x weekly - 3 sets - 10 reps - Sit to Stand with Counter Support  - 1 x daily - 4 x weekly - 3 sets - 10 reps - Supine Short Arc Quad  - 1 x daily - 4 x weekly - 3 sets - 10 reps - Supine Hip Abduction  - 1 x daily - 4 x weekly - 3 sets - 10 reps - Supine Bridge  - 1 x daily - 4 x weekly - 3 sets - 3 reps - 2sec  hold  GOALS: Goals reviewed with patient? Yes  SHORT TERM GOALS: Target date: 12/12/2023  Patient will be independent in home exercise program to improve strength/mobility for better functional independence with ADLs. Baseline: Initiated on 09/11/2023 10/22/2023: will update when appropriate Goal status: IN PROGRESS  LONG TERM GOALS: Target date: 12/31/2023  Patient will increase SIS-16 score to equal to or greater than 10points to demonstrate statistically significant improvement in mobility and quality of life.  Baseline: 49/80 09/19/2023: 45/80 10/22/2023: 46/80 Goal status: IN PROGRESS  2.  Patient (> 34 years old) will complete five times sit to stand test in < 15 seconds indicating an increased LE strength and improved balance. Baseline: 1:45min 09/19/2023: 56.39 seconds using R UE support to push-up from armrest of green chair and requiring skilled min A for balance and lifting/lowering  10/22/2023: 27.10 seconds using R UE support to push-up from armrest  8/12: 27.7 sec with RUE pushing from arm  rest.  Goal status: IN PROGRESS  3.  Patient will increase FIST score by > 6 points to demonstrate decreased fall risk during functional activities Baseline:  43/56  09/23/2023:  53/56 Goal status: MET and UPGRADED  09/23/2023: Patient will increase Berg Balance score to > 45/56 to demonstrate improved balance and decreased fall risk during functional activities and ADLs.  Baseline: 09/23/2023: 14/56 7/31: 20 Goal status: IN PROGRESS  4.  Patient will increase 10 meter walk test to >0.23m/s as to improve gait speed for better community ambulation and to reduce fall risk. Baseline: 09/09/2023: 0.103 m/s ( and 37 seconds) using bari-RW with skilled heavy min A and +2 w/c follow for safety  09/19/2023: 0.40m/s using bari-RW with skilled min A of 1 and +2 w/c follow for safety 10/22/2023: 0.1875 m/s using bari-RW with L hand splint, L swedish knee cage, and only skilled light min A of 1 (no wheelchair follow)  8/12: 0.160m/s with RW, and L hand splint. No knee cage on this day.  Goal status: IN PROGRESS  5.  Patient will reduce timed up and go to <11 seconds to reduce fall risk and demonstrate improved transfer/gait ability. Baseline: unable to complete turn at 10 ft. Mod-max assist due to poor control of LW on RW, resulting in L lateral LOB  09/09/2023: and 32 seconds using bari-RW with L hand splint and +2 on R side for safety, therapist providing skilled min A on L side (Had pt set-up L hand on RW splint, get L foot in proper positioning, and scoot forward in seat before starting the timer) 09/19/2023: 71min43 seconds using bari-RW with only skilled min A of 1 (Had pt set-up L hand on RW splint, get L foot in proper positioning, and scoot forward in seat before starting the timer) 7/2: 78sec(1:18.125) with RW and min assist overall.  10/22/2023: 1 min, 30 seconds using bari-RW with L hand splint and skilled light min assist  8/12: 1:01 min with RW and L hand splint.  Goal status: IN PROGRESS   ASSESSMENT:  CLINICAL IMPRESSION:   Patient arrives highly motivated to participate in therapy session and increase his independence with functional mobility at an ambulatory  level. Patient has follow-up with [facility] next Wednesday 9/3 where they ordered him an XL SKC. Therapy session continued to focus on increased intensity of gait training via use of treadmill and litegait harness to ensure patient safety with focus on increased repetitions at faster gait speed to promote L LE NMR and increased symmetry of gait mechanics. Patient participated in a total of 467ft gait training, without seated rest break, on treadmill today at primarily 0.29mph, but temporarily able to increase to 0.55mph. Following treadmill gait training, patient demonstrates increased gait speed back to his transport chair at end of session. Patient will benefit from continued focus on higher intensity gait training as well as dynamic gait training including lateral stepping, backwards, and obstacle navigation to promote improved motor plan for more consistent L LE foot placement during swing advancement, given impaired sensation. Mr. Petz will benefit from further skilled PT to improve these deficits in order to increase QOL, decrease fall risk, increase independence with functional mobility, and increase independence with ADLs.    OBJECTIVE IMPAIRMENTS: Abnormal gait, cardiopulmonary status limiting activity, decreased activity tolerance, decreased balance, decreased cognition, decreased coordination, decreased endurance, decreased knowledge of condition, decreased knowledge of use of DME, decreased mobility, difficulty walking, decreased ROM, decreased strength, decreased safety awareness, dizziness, hypomobility, increased fascial  restrictions, impaired perceived functional ability, increased muscle spasms, impaired sensation, impaired tone, impaired UE functional use, impaired vision/preception, improper body mechanics, postural dysfunction, and obesity.   ACTIVITY LIMITATIONS: carrying, lifting, bending, sitting, standing, squatting, stairs, transfers, bed mobility, bathing, toileting, dressing,  reach over head, hygiene/grooming, locomotion level, and caring for others  PARTICIPATION LIMITATIONS: meal prep, cleaning, laundry, medication management, interpersonal relationship, driving, shopping, community activity, and yard work  PERSONAL FACTORS: Age, Past/current experiences, Time since onset of injury/illness/exacerbation, and 3+ comorbidities: MG, HTN, CVA, lymphedema are also affecting patient's functional outcome.   REHAB POTENTIAL: Good  CLINICAL DECISION MAKING: Unstable/unpredictable  EVALUATION COMPLEXITY: High  PLAN:  PT FREQUENCY: 1-2x/week  PT DURATION: 8 weeks  PLANNED INTERVENTIONS: 97164- PT Re-evaluation, 97750- Physical Performance Testing, 97110-Therapeutic exercises, 97530- Therapeutic activity, [id number]- Neuromuscular re-education, 97535- Self Care, [id number]- Manual therapy, [id number]- Gait training, [id number]- Orthotic Initial, [phone]- Orthotic/Prosthetic subsequent, [phone]- Canalith repositioning, [id number]- Electrical stimulation (unattended), [phone]- Electrical stimulation (manual), [id number]- Wound care (first 20 sq cm), 97598- Wound care (each additional 20 sq cm), Patient/Family education, Balance training, Stair training, Taping, Dry Needling, Joint mobilization, Joint manipulation, Vestibular training, Visual/preceptual remediation/compensation, Cognitive remediation, DME instructions, Wheelchair mobility training, Cryotherapy, and Moist heat  PLAN FOR NEXT SESSION:    *Progress Note*  HIGT (high intensity gait training) using litegait harness either on treadmill vs overground Dynamic gait training using bari-RW vs litegait Backwards Side stepping Obstacle navigation Working on forward propulsion with increased L hip extension and forward progression of pelvis of L stance phase    Abigayl Hor, PT, DPT, NCS, CSRS Physical Therapist - [place]  [facility]  3:10 PM 11/21/23

## 2023-11-23 NOTE — Therapy (Signed)
 Occupational Therapy Neuro Treatment Note  Patient Name: [name]. MRN: [id number] DOB:[birth year], 73 y.o., male Today's Date: 11/23/2023  PCP: Alm Rower, MD REFERRING PROVIDER: Hermann Toribio PARAS, PA-C  END OF SESSION:  OT End of Session - 11/23/23 1136     Visit Number 27    Number of Visits 48    Date for OT Re-Evaluation 01/21/24    OT Start Time 1315    OT Stop Time 1400    OT Time Calculation (min) 45 min    Activity Tolerance Patient tolerated treatment well    Behavior During Therapy WFL for tasks assessed/performed         Past Medical History:  Diagnosis Date   Anemia    Cervical myelopathy (HCC)    Chronic venous insufficiency    [name] (chronic kidney disease), stage III (HCC)    Diabetes mellitus without complication (HCC)    GERD (gastroesophageal reflux disease)    Hypercholesteremia    Hypothyroidism    Kidney stones    about 50 in the past--last in March 4/24   Leg weakness, bilateral    due to Myasthenia Gravis   Myasthenia gravis (HCC)    Spinal stenosis    Vitamin B 12 deficiency    Vitamin B 12 deficiency    Wears hearing aid    bilateral   Past Surgical History:  Procedure Laterality Date   BACK SURGERY  09/2015   CARDIAC CATHETERIZATION     CATARACT EXTRACTION W/ INTRAOCULAR LENS IMPLANT     CATARACT EXTRACTION W/PHACO Right 12/21/2015   Procedure: CATARACT EXTRACTION PHACO AND INTRAOCULAR LENS PLACEMENT (IOC);  Surgeon: Dene Etienne, MD;  Location: [facility];  Service: Ophthalmology;  Laterality: Right;  DIABETIC - insulin  and oral meds   COLONOSCOPY     SHOULDER SURGERY     labrum repair   TONSILLECTOMY     Patient Active Problem List   Diagnosis Date Noted   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) 10/11/2023   High blood pressure 08/13/2023   Anemia 08/13/2023   Constipation 08/13/2023   Myasthenia gravis (HCC) 08/13/2023   Acute ischemic right MCA stroke  (HCC) 07/19/2023   Lymphedema 12/27/2016   Leg pain 07/15/2016   Chronic venous insufficiency 07/15/2016   Swelling of limb 07/15/2016   Type 2 diabetes mellitus (HCC) 07/15/2016   Hyperlipidemia 07/15/2016   ONSET DATE: 07/13/2023  REFERRING DIAG: Acute ischemic R MCA CVA  THERAPY DIAG: muscle weakness (generalized), other lack of coordination, vision disturbance, hemiplegia and hemiparesis following cerebral infarction affecting left non-dominant side (HCC)  Rationale for Evaluation and Treatment: Rehabilitation  SUBJECTIVE:  SUBJECTIVE STATEMENT: Pt reports doing ok today.  Arrived with una boots applied to both legs after recent visit to wound center.  Pt reports he is to keep these on until his follow up appt next Tues at the wound center.  Pt accompanied by: [name]  PERTINENT HISTORY: Pt. Was admitted to [facility] on 07/13/2023 after sustaining a CVA while on a trip to the beach. Pt. Was diagnosed with R MCA CVA. Pt. Was admitted to inpatient rehab from 07/19/2023-08/13/2023. Past medical history includes high BP, anemia, and Myasthenia Gravis.   PRECAUTIONS: None  WEIGHT BEARING RESTRICTIONS: No  PAIN:  11/21/23: Mild L shoulder pain/stiffness, volar forearm, wrist and digits with end range stretching  FALLS: Has patient fallen in last 6 months? Yes  LIVING ENVIRONMENT: Lives with: lives with their family and lives  with their spouse-  Lives in: House/apartment- 2 story home and resides mostly on the 1st floor Stairs: No- Ramped entrance Has following equipment at home:  Vannie, cane, Steadi, shower chair, handheld shower head, bedside commode  PLOF: Independent and Independent with basic ADLs Independent at home   PATIENT GOALS: Would like to walk 10-20 feet each time.   OBJECTIVE:  Note: Objective measures were completed at Evaluation unless otherwise noted.  HAND DOMINANCE: Right  ADLs: Overall ADLs:  Transfers/ambulation related to ADLs: Eating: Independent,  100% R handed Grooming: independent UB Dressing: Can put on shirt independently LB Dressing: Difficulty with pants and requires assistance getting the pants on his feet, has difficulty putting on shoes and socks Toileting: Tot A for toilet hygiene. Uses Steadi during toilet transfers. Bathing: Requires assist with using the L arm to wash the R arm. Tub Shower transfers: Roll in shower, built in shower chair, grab bars and hand held shower head.  IADLs: Shopping: Total A (Wife typically did the shopping prior to onset)   Light housekeeping: Total A (wife typically performs prior to onset) Meal Prep: Independent with light snack prep Community mobility: No driving, able to get in/out of the car Medication management: Set up assistance from his wife using a pill box (pt. Is responsible for taking medications at the correct time) Financial management: family members check behind him. Handwriting: TBD Hobbies: gardening, wood working and sports- [employer] Work history: Owns a tax business MOBILITY STATUS: Needs Assist:    POSTURE COMMENTS:   Sitting balance: Good supported sitting balance  FUNCTIONAL OUTCOME MEASURES: TBD  UPPER EXTREMITY ROM:    Active ROM Right Eval  [facility] Right 09/23/2023 [facility] Left eval Left 09/23/2023 Left 10/29/23  Shoulder flexion   89(110) 109(114) 111(115)  Shoulder abduction   98(120) 109(120) 111(122)  Shoulder adduction       Shoulder extension       Shoulder internal rotation       Shoulder external rotation       Elbow flexion   120(140) 130(136) 141(145)  Elbow extension   -28(-10) -5(0) -18(0)  Wrist flexion   60(60) Rest at 60 50(65) 54(60)  Wrist extension   -48(42) 10(40) 20(50)  Wrist ulnar deviation     12(22)  Wrist radial deviation     10(20)  Wrist pronation     (82)90  Wrist supination     64(74)  (Blank rows = not tested)  Eval: L Digit flexion: 2nd:3cm (0cm) 3rd: 0cm (0cm), 4th: 0cm (0cm), 5th: 2cm (0cm)  09/23/23:  L Digit flexion  to [facility]: 2nd: 3.5cm (0cm), 3rd: 4 cm(0cm), 4th 3.5cm (0cm), 5th: 2cm (0cm)  Eval:  L Digit extension: Active Gross digit extension through 10% of ROM  09/23/23:  L Digit extension: Active Gross digit extension through 50% of ROM  10/29/23:  L Digit extension: Active Gross digit extension through 25-50% of ROM  UPPER EXTREMITY MMT:     MMT Right eval Left eval Left 09/23/2023 Left 10/29/23  Shoulder flexion 5 3- 3-/5 3-/5  Shoulder abduction 5 3- 3-/5 3-/5  Shoulder adduction      Shoulder extension      Shoulder internal rotation      Shoulder external rotation      Middle trapezius      Lower trapezius      Elbow flexion 5 TBD 3+/5 4-/5  Elbow extension 5 TBD 4-/5 3-/5  Wrist flexion   2/5 3-/5  Wrist extension  2-  2/5 2+/5  Wrist ulnar deviation      Wrist radial deviation      Wrist pronation    3/5  Wrist supination    3-/5  (Blank rows = not tested)  HAND FUNCTION:  Eval:  Grip strength: Right: 94#, Left: 1#,   Lateral Key Pinch strength: Right: 14#, Left: 3#  10/29/23:   Grip strength: Left: 16#  Lateral Key Pinch strength: Left: 5#  COORDINATION:  Box and Blocks: Right: 34 blocks completed in 1 min., Left: 0 blocks completed in 1 min.   10/29/23:  Box and Blocks: Right: 34 blocks completed in 1 min., Left: 2 blocks completed in 1 min.  9 Hole Peg Test:  Left: To remove 9 pegs from a vertical position: 1 min. &13 sec.  SENSATION: Sensation feels different in both hands.  L hand Light touch- impaired L hand Proprioception- impaired  EDEMA: Has had hx of edema, and came to Eval session with swelling in the L hand/wrist/forearm.   *pt. Has a laceration from his dog on the dorsal aspect of the L hand.   MUSCLE TONE: Increased tone through the UE and hand flexors.   COGNITION: Overall cognitive status: Within functional limits for tasks assessed  VISION: Subjective report: L sided inattention Baseline vision: Wears glasses for reading  only Visual history: Right visual occlusion  VISION ASSESSMENT: To be assessed  PERCEPTION: TBD  PRAXIS: Impaired                                                                                                                 TREATMENT DATE: 11/21/2023 Neuro re-ed: -Facilitated LUE grasp/release skills working to place and remove [place]  discs in/out of grid.  2.5 foam cushion placed on table top to elevate disc grid surface d/t pt sitting high up in transport chair.  Min-mod A for disc set up in hand.   Therapeutic Activity: -Facilitated LUE GMC/FMC/grasp/release skills working to place jumbo pegs into pegboard, and remove pegs with hand gripper set at 6.6#, dropping pegs into container.  OT provided vertical stability of peg for pt to grasp within thumb and lateral aspect of IF, or using a 5 fingered grasp pattern to place pegs into pegboard (min-mod A for peg placement), mod A with hand gripper, progressing to min A by 2nd round.     Self Care: -Bimanual task practice opening containers, including flip down plastic lid on a medium rectangle plastic container, and flip up round lid on large plastic container.  Pt practiced opening each by initially stabilizing container against body with L hand, and using R hand to open tops.  Pt then provided with mod vc and demo for stabilizing with the RUE and flipping lids with the L.  -Condition management educ: neuplastic changes in the brain requiring high reps of task practice with the affected limb, and participation in intense and challenging activities to maximize motor relearning.  -HEP review: provided activity recommendations for home to maximize neuroplasticity as noted above  Education details: principles  of neuroplasticity with recommended activities for HEP to maximize motor relearning in the LUE Person educated: Patient and spouse Education method: Explanation and Verbal cues, return demo Education comprehension: verbalized  understanding and needs further education  HOME EXERCISE PROGRAM: -grasping/releasing tennis ball sized circular sphere, holding while performing wrist extension -Repetitions for grasping/releasing 1 blocks.  GOALS: Goals reviewed with patient? Yes  SHORT TERM GOALS: Target date:.   Pt. Will be independent with HEP for the LUE.  Baseline: 10/29/23: Pt. requires assist from wife with HEPs. Pt. Is consistently working on HEP's at home. 09/19/23: Requires assist from his wife Eval; no current HEP Goal status: Ongoing  LONG TERM GOALS: Target date: 01/21/2024  Pt. Will increase L shoulder flexion by 10 degrees to be able to reach up to shelves.  Baseline: 10/29/23: 888(884) 09/19/23: Pt. Continues to be limited with reaching up to shelves. Eval: L shoulder flexion is 89 (110). Goal status: Ongoing  2.  Pt. Will increase L shoulder abduction by 10 degrees to assist with underarm hygiene.  Baseline:10/29/23: 888(877) 09/19/23: Pt. Continues to require assist with underarm hygiene.Eval: L shoulder abduction is 98 (120).  Goal status: Ongoing  3.  Pt. Will improve L wrist extension by 10 degrees to be able to initiate anticipation of grasping for objects.  Baseline: 10/29/23: 20(55) 09/19/23: Consistent activation noted in the left wrist extensors with facilitation. Eval: -48 (42) Goal status: Ongoing  4.  Pt. Will improve active gross digit extension through 50% of the range to be able to release objects in his hand consistently.  Baseline: 10/28/20: Gross digit extension through 25-50% of the range. 09/19/23: gross digit extension noted through 25% range with facilitation.Eval: gross digit extension is 10% of the range  Goal status: Goal updated to 50% of the range  5.  Pt. Will independently demonstrate visual compensatory strategies when navigating through environment and during tabletop tasks. Baseline: 10/29/2023: Pt. Continues to less cuing for left sided awareness. 09/19/23: Pt. requires fewer  cues for left sided awareness Eval: Pt. Requires consistent cuing for L sided awareness.  Goal status: Ongoing   6. Pt. Will improve left hand [facility] skills to be able to manipulate small objects during ADLs, and IADLs.   Baseline: 10/29/23: 9 Hole Peg Test: Pt. Was able to remove 9 pegs from vertical position on the pegboard in 1 min. & 13 sec. Pt. Is not yet able to pick up pegs from a horizontal position to place them in the pegboard.   Goal status: New   7. Pt. Will improve left hand function skills as evidence by improve score to 4 blocks on the Blocks, and Box test.   Baseline: 10/29/23: Box and Blocks Test: 2 Blocks completed in 1 min.   Goal Status: New  ASSESSMENT: CLINICAL IMPRESSION:  Pt with good tolerance to therapeutic activities noted above, continuing to focus on reaching toward a target and grasp/release with the L hand.  Pt required frequent passive wrist and digit extension stretching between reps of task practice to minimize distal flexor tone in the LUE.  OT able to observe gradual improvements in active MP and PIP extension after high reps of grasp/release activities noted above.  Pt demonstrated improvements in engaging the LUE to open flip down and flip up tops of plastic containers, initially using L hand only as a stabilizer, then progressing to using R as a stabilizer and L to manipulate tops.  OT encouraged pt work towards practice of bimanual tasks using L as more  than a stabilizer during daily tasks, with examples provided.  Pt verbalized understanding.  Pt continues to benefit from OT services to work on improving LUE functioning to increase engagement of the LUE during daily task, and to provide education about compensatory strategies during ADLs/IADLs.  PERFORMANCE DEFICITS: in functional skills including ADLs, IADLs, coordination, dexterity, proprioception, sensation, edema, tone, ROM, strength, pain, Fine motor control, Gross motor control, mobility, balance, endurance,  vision, and UE functional use, cognitive skills including perception, and psychosocial skills including coping strategies, environmental adaptation, and routines and behaviors.   IMPAIRMENTS: are limiting patient from ADLs, IADLs, and leisure.   CO-MORBIDITIES: may have co-morbidities  that affects occupational performance. Patient will benefit from skilled OT to address above impairments and improve overall function.  MODIFICATION OR ASSISTANCE TO COMPLETE EVALUATION: Min-Moderate modification of tasks or assist with assess necessary to complete an evaluation.  OT OCCUPATIONAL PROFILE AND HISTORY: Detailed assessment: Review of records and additional review of physical, cognitive, psychosocial history related to current functional performance.  CLINICAL DECISION MAKING: Moderate - several treatment options, min-mod task modification necessary  REHAB POTENTIAL: Good  EVALUATION COMPLEXITY: Moderate  PLAN:  OT FREQUENCY: 2x/week  OT DURATION: 12 weeks  PLANNED INTERVENTIONS: 97168 OT Re-evaluation, 97535 self care/ADL training, 02889 therapeutic exercise, 97530 therapeutic activity, 97112 neuromuscular re-education, 97140 manual therapy, 97018 paraffin, 02989 moist heat, 97010 cryotherapy, 97034 contrast bath, 97760 Orthotic Initial, 97763 Orthotic/Prosthetic subsequent, passive range of motion, visual/perceptual remediation/compensation, energy conservation, and DME and/or AE instructions  RECOMMENDED OTHER SERVICES: PT and ST  CONSULTED AND AGREED WITH PLAN OF CARE: Patient and family member/caregiver  PLAN FOR NEXT SESSION: see above  Inocente Blazing, MS, OTR/L

## 2023-11-26 ENCOUNTER — Ambulatory Visit

## 2023-11-26 ENCOUNTER — Ambulatory Visit: Attending: Physician Assistant | Admitting: Physical Therapy

## 2023-11-26 ENCOUNTER — Encounter: Admitting: Speech Pathology

## 2023-11-26 ENCOUNTER — Encounter: Attending: Physician Assistant | Admitting: Physician Assistant

## 2023-11-26 DIAGNOSIS — R278 Other lack of coordination: Secondary | ICD-10-CM | POA: Diagnosis present

## 2023-11-26 DIAGNOSIS — I69354 Hemiplegia and hemiparesis following cerebral infarction affecting left non-dominant side: Secondary | ICD-10-CM

## 2023-11-26 DIAGNOSIS — I890 Lymphedema, not elsewhere classified: Secondary | ICD-10-CM | POA: Insufficient documentation

## 2023-11-26 DIAGNOSIS — R2681 Unsteadiness on feet: Secondary | ICD-10-CM | POA: Diagnosis present

## 2023-11-26 DIAGNOSIS — M6281 Muscle weakness (generalized): Secondary | ICD-10-CM

## 2023-11-26 DIAGNOSIS — R262 Difficulty in walking, not elsewhere classified: Secondary | ICD-10-CM | POA: Insufficient documentation

## 2023-11-26 DIAGNOSIS — R269 Unspecified abnormalities of gait and mobility: Secondary | ICD-10-CM | POA: Diagnosis present

## 2023-11-26 DIAGNOSIS — L97811 Non-pressure chronic ulcer of other part of right lower leg limited to breakdown of skin: Secondary | ICD-10-CM | POA: Diagnosis not present

## 2023-11-26 DIAGNOSIS — E11622 Type 2 diabetes mellitus with other skin ulcer: Secondary | ICD-10-CM | POA: Insufficient documentation

## 2023-11-26 DIAGNOSIS — L97821 Non-pressure chronic ulcer of other part of left lower leg limited to breakdown of skin: Secondary | ICD-10-CM | POA: Diagnosis not present

## 2023-11-26 NOTE — Therapy (Signed)
 Occupational Therapy Neuro Treatment Note  Patient Name: [name]. MRN: [id number] DOB:07-11-1950, 73 y.o., male Today's Date: 11/26/2023  PCP: Alm Rower, MD REFERRING PROVIDER: Hermann Toribio PARAS, PA-C  END OF SESSION:  OT End of Session - 11/26/23 1544     Visit Number 28    Number of Visits 48    Date for OT Re-Evaluation 01/21/24    OT Start Time 1445    OT Stop Time 1530    OT Time Calculation (min) 45 min    Activity Tolerance Patient tolerated treatment well    Behavior During Therapy WFL for tasks assessed/performed         Past Medical History:  Diagnosis Date   Anemia    Cervical myelopathy (HCC)    Chronic venous insufficiency    [name] (chronic kidney disease), stage III (HCC)    Diabetes mellitus without complication (HCC)    GERD (gastroesophageal reflux disease)    Hypercholesteremia    Hypothyroidism    Kidney stones    about 50 in the past--last in March 4/24   Leg weakness, bilateral    due to Myasthenia Gravis   Myasthenia gravis (HCC)    Spinal stenosis    Vitamin B 12 deficiency    Vitamin B 12 deficiency    Wears hearing aid    bilateral   Past Surgical History:  Procedure Laterality Date   BACK SURGERY  09/2015   CARDIAC CATHETERIZATION     CATARACT EXTRACTION W/ INTRAOCULAR LENS IMPLANT     CATARACT EXTRACTION W/PHACO Right 12/21/2015   Procedure: CATARACT EXTRACTION PHACO AND INTRAOCULAR LENS PLACEMENT (IOC);  Surgeon: Dene Etienne, MD;  Location: [facility];  Service: Ophthalmology;  Laterality: Right;  DIABETIC - insulin  and oral meds   COLONOSCOPY     SHOULDER SURGERY     labrum repair   TONSILLECTOMY     Patient Active Problem List   Diagnosis Date Noted   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) 10/11/2023   High blood pressure 08/13/2023   Anemia 08/13/2023   Constipation 08/13/2023   Myasthenia gravis (HCC) 08/13/2023   Acute ischemic right MCA stroke  (HCC) 07/19/2023   Lymphedema 12/27/2016   Leg pain 07/15/2016   Chronic venous insufficiency 07/15/2016   Swelling of limb 07/15/2016   Type 2 diabetes mellitus (HCC) 07/15/2016   Hyperlipidemia 07/15/2016   ONSET DATE: 07/13/2023  REFERRING DIAG: Acute ischemic R MCA CVA  THERAPY DIAG: muscle weakness (generalized), other lack of coordination, vision disturbance, hemiplegia and hemiparesis following cerebral infarction affecting left non-dominant side (HCC)  Rationale for Evaluation and Treatment: Rehabilitation  SUBJECTIVE:  SUBJECTIVE STATEMENT: Pt reports that his wife has been making sure he attempts to do 2 handed tasks, instead of compensating with the R arm only. Pt accompanied by: [name]  PERTINENT HISTORY: Pt. Was admitted to [facility] on 07/13/2023 after sustaining a CVA while on a trip to the beach. Pt. Was diagnosed with R MCA CVA. Pt. Was admitted to inpatient rehab from 07/19/2023-08/13/2023. Past medical history includes high BP, anemia, and Myasthenia Gravis.   PRECAUTIONS: None  WEIGHT BEARING RESTRICTIONS: No  PAIN:  11/26/23: 2-3/10 pain in BLEs  FALLS: Has patient fallen in last 6 months? Yes  LIVING ENVIRONMENT: Lives with: lives with their family and lives with their spouse-  Lives in: House/apartment- 2 story home and resides mostly on the 1st floor Stairs: No- Ramped entrance Has following equipment at home:  Walker, cane, Steadi, shower chair, handheld shower head, bedside commode  PLOF: Independent and Independent with basic ADLs Independent at home   PATIENT GOALS: Would like to walk 10-20 feet each time.   OBJECTIVE:  Note: Objective measures were completed at Evaluation unless otherwise noted.  HAND DOMINANCE: Right  ADLs: Overall ADLs:  Transfers/ambulation related to ADLs: Eating: Independent, 100% R handed Grooming: independent UB Dressing: Can put on shirt independently LB Dressing: Difficulty with pants and requires assistance  getting the pants on his feet, has difficulty putting on shoes and socks Toileting: Tot A for toilet hygiene. Uses Steadi during toilet transfers. Bathing: Requires assist with using the L arm to wash the R arm. Tub Shower transfers: Roll in shower, built in shower chair, grab bars and hand held shower head.  IADLs: Shopping: Total A (Wife typically did the shopping prior to onset)   Light housekeeping: Total A (wife typically performs prior to onset) Meal Prep: Independent with light snack prep Community mobility: No driving, able to get in/out of the car Medication management: Set up assistance from his wife using a pill box (pt. Is responsible for taking medications at the correct time) Financial management: family members check behind him. Handwriting: TBD Hobbies: gardening, wood working and sports- [employer] Work history: Owns a tax business MOBILITY STATUS: Needs Assist:    POSTURE COMMENTS:   Sitting balance: Good supported sitting balance  FUNCTIONAL OUTCOME MEASURES: TBD  UPPER EXTREMITY ROM:    Active ROM Right Eval  [facility] Right 09/23/2023 [facility] Left eval Left 09/23/2023 Left 10/29/23  Shoulder flexion   89(110) 109(114) 111(115)  Shoulder abduction   98(120) 109(120) 111(122)  Shoulder adduction       Shoulder extension       Shoulder internal rotation       Shoulder external rotation       Elbow flexion   120(140) 130(136) 141(145)  Elbow extension   -28(-10) -5(0) -18(0)  Wrist flexion   60(60) Rest at 60 50(65) 54(60)  Wrist extension   -48(42) 10(40) 20(50)  Wrist ulnar deviation     12(22)  Wrist radial deviation     10(20)  Wrist pronation     (82)90  Wrist supination     64(74)  (Blank rows = not tested)  Eval: L Digit flexion: 2nd:3cm (0cm) 3rd: 0cm (0cm), 4th: 0cm (0cm), 5th: 2cm (0cm)  09/23/23:  L Digit flexion to [facility]: 2nd: 3.5cm (0cm), 3rd: 4 cm(0cm), 4th 3.5cm (0cm), 5th: 2cm (0cm)  Eval:  L Digit extension: Active Gross digit extension  through 10% of ROM  09/23/23:  L Digit extension: Active Gross digit extension through 50% of ROM  10/29/23:  L Digit extension: Active Gross digit extension through 25-50% of ROM  UPPER EXTREMITY MMT:     MMT Right eval Left eval Left 09/23/2023 Left 10/29/23  Shoulder flexion 5 3- 3-/5 3-/5  Shoulder abduction 5 3- 3-/5 3-/5  Shoulder adduction      Shoulder extension      Shoulder internal rotation      Shoulder external rotation      Middle trapezius      Lower trapezius      Elbow flexion 5 TBD 3+/5 4-/5  Elbow extension 5 TBD 4-/5 3-/5  Wrist flexion   2/5 3-/5  Wrist extension  2- 2/5 2+/5  Wrist ulnar deviation      Wrist radial deviation      Wrist pronation    3/5  Wrist  supination    3-/5  (Blank rows = not tested)  HAND FUNCTION:  Eval:  Grip strength: Right: 94#, Left: 1#,   Lateral Key Pinch strength: Right: 14#, Left: 3#  10/29/23:   Grip strength: Left: 16#  Lateral Key Pinch strength: Left: 5#  COORDINATION:  Box and Blocks: Right: 34 blocks completed in 1 min., Left: 0 blocks completed in 1 min.   10/29/23:  Box and Blocks: Right: 34 blocks completed in 1 min., Left: 2 blocks completed in 1 min.  9 Hole Peg Test:  Left: To remove 9 pegs from a vertical position: 1 min. &13 sec.  SENSATION: Sensation feels different in both hands.  L hand Light touch- impaired L hand Proprioception- impaired  EDEMA: Has had hx of edema, and came to Eval session with swelling in the L hand/wrist/forearm.   *pt. Has a laceration from his dog on the dorsal aspect of the L hand.   MUSCLE TONE: Increased tone through the UE and hand flexors.   COGNITION: Overall cognitive status: Within functional limits for tasks assessed  VISION: Subjective report: L sided inattention Baseline vision: Wears glasses for reading only Visual history: Right visual occlusion  VISION ASSESSMENT: To be assessed  PERCEPTION: TBD  PRAXIS: Impaired                                                                                                                  TREATMENT DATE: 11/26/2023 Therapeutic Exercise: -Performed passive stretching to promote L shoulder abd and ER for improving reach for UB ADLs -Performed passive L wrist and digit ext stretching intermittently throughout session to decreased stiffness in prep for grasping and releasing ADL supplies from L hand. -Performed AAROM for L hand digit abd/add and digit ext  Therapeutic Activity: -Facilitated LUE GMC/FMC/grasp/release skills working to place jumbo pegs into pegboard, and remove pegs with hand gripper set at 6.6#, dropping pegs into container.  OT provided vertical stability of peg for pt to grasp within thumb and lateral aspect of IF, or using a 5 fingered grasp pattern to place pegs into pegboard (min-mod A for peg placement), mod A with hand gripper for both trials.    Self Care: -Bimanual task practice opening/closing containers, including large plastic flip top container filled with grooming supplies, toothpaste tube, deodorant top, electric razor top.  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  GOALS: Goals reviewed with patient? Yes  SHORT TERM GOALS: Target date:.   Pt. Will be independent with HEP for the  LUE.  Baseline: 10/29/23: Pt. requires assist from  wife with HEPs. Pt. Is consistently working on HEP's at home. 09/19/23: Requires assist from his wife Eval; no current HEP Goal status: Ongoing  LONG TERM GOALS: Target date: 01/21/2024  Pt. Will increase L shoulder flexion by 10 degrees to be able to reach up to shelves.  Baseline: 10/29/23: 888(884) 09/19/23: Pt. Continues to be limited with reaching up to shelves. Eval: L shoulder flexion is 89 (110). Goal status: Ongoing  2.  Pt. Will increase L shoulder abduction by 10 degrees to assist with underarm hygiene.  Baseline:10/29/23: 888(877) 09/19/23: Pt. Continues to require assist with underarm hygiene.Eval: L shoulder abduction is 98 (120).  Goal status: Ongoing  3.  Pt. Will improve L wrist extension by 10 degrees to be able to initiate anticipation of grasping for objects.  Baseline: 10/29/23: 20(55) 09/19/23: Consistent activation noted in the left wrist extensors with facilitation. Eval: -48 (42) Goal status: Ongoing  4.  Pt. Will improve active gross digit extension through 50% of the range to be able to release objects in his hand consistently.  Baseline: 10/28/20: Gross digit extension through 25-50% of the range. 09/19/23: gross digit extension noted through 25% range with facilitation.Eval: gross digit extension is 10% of the range  Goal status: Goal updated to 50% of the range  5.  Pt. Will independently demonstrate visual compensatory strategies when navigating through environment and during tabletop tasks. Baseline: 10/29/2023: Pt. Continues to less cuing for left sided awareness. 09/19/23: Pt. requires fewer cues for left sided awareness Eval: Pt. Requires consistent cuing for L sided awareness.  Goal status: Ongoing   6. Pt. Will improve left hand [facility] skills to be able to manipulate small objects during ADLs, and IADLs.   Baseline: 10/29/23: 9 Hole Peg Test: Pt. Was able to remove 9 pegs from vertical position on the pegboard in 1 min. & 13 sec. Pt. Is not yet able to pick up pegs from  a horizontal position to place them in the pegboard.   Goal status: New   7. Pt. Will improve left hand function skills as evidence by improve score to 4 blocks on the Blocks, and Box test.   Baseline: 10/29/23: Box and Blocks Test: 2 Blocks completed in 1 min.   Goal Status: New  ASSESSMENT: CLINICAL IMPRESSION:  Pt reports spouse has given consistent reminders to pt at home for attempting tasks with the L hand first before R hand takes over.  Focussed today on bimanual task practice for manipulation of grooming items.  Pt continues to present with increased flexor tone in the L hand, and L shoulder stiffness, but responding well to passive stretching to maximize end range for improving L hand grasp/release and reaching to head for UB ADLs.  Pt is not yet able to wash hair bimanually d/t LUE reaching only to face, but pt is now able to scratch his nose and wash his face with the L hand.  Pt practiced moving jumbo pegs in/out of pegboard today to promote digit extension and grasp patterns in the L hand; mod A for both movements.  Pt continues to benefit from OT services to work on improving LUE functioning to increase engagement of the LUE during daily task, and to provide education about compensatory strategies during ADLs/IADLs.  PERFORMANCE DEFICITS: in functional skills including ADLs, IADLs, coordination, dexterity, proprioception, sensation, edema, tone, ROM, strength, pain, Fine motor control, Gross motor control, mobility, balance, endurance, vision, and UE functional use, cognitive  skills including perception, and psychosocial skills including coping strategies, environmental adaptation, and routines and behaviors.   IMPAIRMENTS: are limiting patient from ADLs, IADLs, and leisure.   CO-MORBIDITIES: may have co-morbidities  that affects occupational performance. Patient will benefit from skilled OT to address above impairments and improve overall function.  MODIFICATION OR ASSISTANCE TO  COMPLETE EVALUATION: Min-Moderate modification of tasks or assist with assess necessary to complete an evaluation.  OT OCCUPATIONAL PROFILE AND HISTORY: Detailed assessment: Review of records and additional review of physical, cognitive, psychosocial history related to current functional performance.  CLINICAL DECISION MAKING: Moderate - several treatment options, min-mod task modification necessary  REHAB POTENTIAL: Good  EVALUATION COMPLEXITY: Moderate  PLAN:  OT FREQUENCY: 2x/week  OT DURATION: 12 weeks  PLANNED INTERVENTIONS: 97168 OT Re-evaluation, 97535 self care/ADL training, 02889 therapeutic exercise, 97530 therapeutic activity, 97112 neuromuscular re-education, 97140 manual therapy, 97018 paraffin, 02989 moist heat, 97010 cryotherapy, 97034 contrast bath, 97760 Orthotic Initial, 97763 Orthotic/Prosthetic subsequent, passive range of motion, visual/perceptual remediation/compensation, energy conservation, and DME and/or AE instructions  RECOMMENDED OTHER SERVICES: PT and ST  CONSULTED AND AGREED WITH PLAN OF CARE: Patient and family member/caregiver  PLAN FOR NEXT SESSION: see above  Inocente Blazing, MS, OTR/L

## 2023-11-26 NOTE — Therapy (Signed)
 OUTPATIENT PHYSICAL THERAPY TREATMENT Physical Therapy Progress Note   Dates of reporting period  10/22/2023   to   11/26/2023   Patient Name: [name]. MRN: [id number] DOB:09/28/1950, 73 y.o., male Today's Date: 11/26/2023 PCP: Dennise Remak, MD  REFERRING PROVIDER:    Pegge Toribio PARAS, PA-C    END OF SESSION:    PT End of Session - 11/26/23 1535     Visit Number 30    Number of Visits 40    Date for PT Re-Evaluation 12/31/23    Authorization Type [employer] Medicare    Authorization Time Period [facility] auth#: [id number] for 6 PT vsts from 8/21-9/18 (9/2 is 4 of 6)    Progress Note Due on Visit 30    PT Start Time 1531    PT Stop Time 1626    PT Time Calculation (min) 55 min    Equipment Utilized During Treatment Gait belt    Activity Tolerance Patient tolerated treatment well;No increased pain    Behavior During Therapy WFL for tasks assessed/performed            Past Medical History:  Diagnosis Date   Anemia    Cervical myelopathy (HCC)    Chronic venous insufficiency    [name] (chronic kidney disease), stage III (HCC)    Diabetes mellitus without complication (HCC)    GERD (gastroesophageal reflux disease)    Hypercholesteremia    Hypothyroidism    Kidney stones    about 50 in the past--last in March 4/24   Leg weakness, bilateral    due to Myasthenia Gravis   Myasthenia gravis (HCC)    Spinal stenosis    Vitamin B 12 deficiency    Vitamin B 12 deficiency    Wears hearing aid    bilateral   Past Surgical History:  Procedure Laterality Date   BACK SURGERY  09/2015   CARDIAC CATHETERIZATION     CATARACT EXTRACTION W/ INTRAOCULAR LENS IMPLANT     CATARACT EXTRACTION W/PHACO Right 12/21/2015   Procedure: CATARACT EXTRACTION PHACO AND INTRAOCULAR LENS PLACEMENT (IOC);  Surgeon: Dene Etienne, MD;  Location: [facility];  Service: Ophthalmology;  Laterality: Right;  DIABETIC - insulin  and oral meds   COLONOSCOPY     SHOULDER SURGERY      labrum repair   TONSILLECTOMY     Patient Active Problem List   Diagnosis Date Noted   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) 10/11/2023   High blood pressure 08/13/2023   Anemia 08/13/2023   Constipation 08/13/2023   Myasthenia gravis (HCC) 08/13/2023   Acute ischemic right MCA stroke (HCC) 07/19/2023   Lymphedema 12/27/2016   Leg pain 07/15/2016   Chronic venous insufficiency 07/15/2016   Swelling of limb 07/15/2016   Type 2 diabetes mellitus (HCC) 07/15/2016   Hyperlipidemia 07/15/2016    ONSET DATE: 07/19/23  REFERRING DIAG:  Diagnosis  I63.511 (ICD-10-CM) - Cerebral infarction due to unspecified occlusion or stenosis of right middle cerebral artery    THERAPY DIAG:  Muscle weakness (generalized)  Other lack of coordination  Hemiplegia and hemiparesis following cerebral infarction affecting left non-dominant side (HCC)  Difficulty in walking, not elsewhere classified  Unsteadiness on feet  Abnormality of gait and mobility  Rationale for Evaluation and Treatment: Rehabilitation  SUBJECTIVE:  SUBJECTIVE STATEMENT:   Patient states the past few days he has tried to walk from his recliner, down the distance of the couch (~45ft) and then turn around to go back to his recliner using his RW; however, patient states he starts to feel like he is losing his balance backwards and sits down on the couch for safety, rather than trying to turn around and go back to his recliner. Reports he is trying to perform a right turn when he is turning. Reports he feels something is off with the timing of him turning the AD and stepping his feet. Denies falls. Denies pain.  Patient and wife report no negative side effects from wearing his SKC again during last  session. Pt/wife report patient has follow-up with [facility], tomorrow, 9/3, to receive his new SKC.  Patient reports he had a wound clinic appointment earlier today and they wouldn't let him use a RW to perform transfers and he was terrified he was going to have a fall and unhappy with the experience (this likely contributed to patient's change in transfer motor planning/sequencing today). Patient states he now has more sores on his legs from the last dressing the wound clinic put on compared to what he had prior to seeing the wound clinic.     PERTINENT HISTORY:  CVA with Left hemiplegia in April 2025. Pt started on a swedish knee cage at CIR due to uncontrolled hyperextension in stance. Pt had not been moving much since discharging from CIR, as therapists instructed pt to perform transfers only at home due to high fall risk with ambulation. Continues to have significant weakenss in the LUE with difficulty extending fingers as well as numbness and inattention to the L side of body resulting in multiple cuts on Arm and leg with WC mobility in home. Will be attaining custom power WC from [employer]. Has loaner currently.   PAIN:  Are you having pain? No  PRECAUTIONS: Fall, history of wounds on LLE   *Latex allergy   WEIGHT BEARING RESTRICTIONS: No  FALLS: Has patient fallen in last 6 months? Yes. Number of falls 1  LIVING ENVIRONMENT: Lives with: lives with their spouse Lives in: House/apartment Stairs: Ramps installed while in the Hospital  Has following equipment at home: Vannie - 2 wheeled and Wheelchair (power)  PLOF: Independent with basic ADLs, Independent with household mobility without device, and Independent with community mobility with device with [facility]   PATIENT GOALS: relearn to Walk 15-26ft.   OBJECTIVE:                                                                                                                              TREATMENT DATE: 11/26/2023  Pt arrives in  transport chair.   Therapy session focused on re-assessment of standardized outcome measures and subjective questionnaire to determine pt's progress with therapy thus far.  Stroke Impact Scale 16 (Copyrighted instrument, [facility])  In the  past 2 weeks, how difficult was it to...  Rating Scale 5 = Not difficult at all 4 = A little difficult 3 = Somewhat difficult 2 = Very difficult 1 = Could not do at all  a. Dress the top part of your body? 4  b. Bathe yourself? 4  c. Get to the toilet on time? 4  d. Control your bladder (not have an accident)? 5  e. Control your bowels (not have an accident)? 4  f. Stand without losing balance? 3  g. Go shopping? 2  h. Do heavy household chores (e.g. vacuum, laundry or  yard work)? 2  i. Stay sitting without losing your balance? 5  j. Walk without losing your balance? 2  k. Move from a bed to a chair? 4  l. Walk fast? 1  m. Climb one flight of stairs? 1  n. Walk one block? 1  o. Get in and out of a car? 4  p. Carry heavy objects (e.g. bag of groceries) with your  affected hand? 1  Sum:  47/80  MDC (Minimal Detectable Change) is >/=8    Donned pt's SKC for session since pt with no adverse effects from wearing it during last session - continues to be too tight around his lower leg. Doffed SKC at end of session. Patient should be receiving larger [facility] tomorrow from [facility].    10 Meter Walk Test: Patient instructed to walk 10 meters (32.8 ft) as quickly and as safely as possible at their normal speed x2 and at a fast speed x2. Time measured from 2 meter mark to 8 meter mark to accommodate ramp-up and ramp-down.  Normal speed 1: 0.19 m/s (52.51 seconds) Normal speed 2: 0.21 m/s (48.78 seconds) Average Normal speed: 0.20 m/s using bari-RW with CGA/min A for balance Fast speed 1: 0.27 m/s (36.98 seconds) Fast speed 2: 0.28 m/s (36.03 seconds) Average Fast speed: 0.275 m/s using bari-RW with CGA/min A for  balance Cut off scores: <0.4 m/s = household Ambulator, 0.4-0.8 m/s = limited community Ambulator, >0.8 m/s = community Ambulator, >1.2 m/s = crossing a street, <1.0 = increased fall risk MCID 0.05 m/s (small), 0.13 m/s (moderate), 0.06 m/s (significant)  (ANPTA Core Set of Outcome Measures for Adults with Neurologic Conditions, 2018) Pt able to complete all 4 trials without seated rest break today! CGA-min A for all trials.    Participated in Timed Up and Go (TUG): 1st trial: 47.16 seconds 2nd trial: 55.97 seconds  Average: 51.57 seconds using bari-RW with CGA-minA When turning to sit in chair on his R, provided cuing to step L LE back and in; otherwise, he leaves it too far abducted out from underneath his BOS Patient demonstrates high fall risk as indicated by requiring >13.5seconds to complete the TUG.    Five times Sit to Stand Test (FTSS) "Stand up and sit down as quickly as possible 5 times, keeping your arms folded across your chest."    TIME:  46.92 seconds using R UE support to push-up from armrest of green chair and not using L UE support with min A for facilitating anterior weight shift and balance in standing due to posterior lean bias; decreased forward lean secondary to fatigue 37.42 seconds using R UE support to push-up from armrest of green chair and not using L UE support with min-modA for facilitating anterior weight shift and lifting to come to stand secondary to fatigue   Times > 13.6 seconds is associated with increased disability and morbidity (  Guralnik, 2000) Times > 15 seconds is predictive of recurrent falls in healthy individuals aged 70 and older (Buatois, et al., 2008) Normal performance values in community dwelling individuals aged 3 and older (Bohannon, 2006): 60-69 years: 11.4 seconds 70-79 years: 12.6 seconds 80-89 years: 14.8 seconds  MCID: >= 2.3 seconds for Vestibular Disorders Ary, 2006)  Patient participated in [place] Balance Test and  demonstrates increased fall risk as noted by score of  17/56.  (<36= high risk for falls, close to 100%; 37-45 significant >80%; 46-51 moderate >50%; 52-55 lower >25%).   [facility] PT Assessment - 11/26/23 0001       Berg Balance Test   Sit to Stand Needs minimal aid to stand or to stabilize    Standing Unsupported Able to stand 2 minutes with supervision    Sitting with Back Unsupported but Feet Supported on Floor or Stool Able to sit safely and securely 2 minutes    Stand to Sit Controls descent by using hands    Transfers Needs one person to assist    Standing Unsupported with Eyes Closed Able to stand 10 seconds with supervision   but has posterior LOB at end to sit on mat   Standing Unsupported with Feet Together Needs help to attain position but able to stand for 30 seconds with feet together    From Standing, Reach Forward with Outstretched Arm Reaches forward but needs supervision    From Standing Position, Pick up Object from Floor Unable to try/needs assist to keep balance    From Standing Position, Turn to Look Behind Over each Shoulder Needs assist to keep from losing balance and falling    Turn 360 Degrees Needs assistance while turning    Standing Unsupported, Alternately Place Feet on Step/Stool Needs assistance to keep from falling or unable to try   without UE support from RW; pt reported previous trial with RW   Standing Unsupported, One Foot in [employer] balance while stepping or standing   without UE support at RW   Standing on One Leg Unable to try or needs assist to prevent fall   without UE support at [facility]   Total Score 17           PATIENT EDUCATION: Education details: knee cage needs assessmnet as it is causeing crossover gait.  Person educated: Patient and Spouse Education method: Explanation, VC/TC/Demo Education comprehension: verbalized understanding  HOME EXERCISE PROGRAM: Access Code: [email or web address] URL: [email or web address] Date:  09/11/2023 Prepared by: Massie Dollar  Exercises - Seated Knee Extension AROM  - 1 x daily - 4 x weekly - 3 sets - 10 reps - Seated Hip Abduction  - 1 x daily - 4 x weekly - 3 sets - 10 reps - Seated March  - 1 x daily - 4 x weekly - 3 sets - 10 reps - Sit to Stand with Counter Support  - 1 x daily - 4 x weekly - 3 sets - 10 reps - Supine Short Arc Quad  - 1 x daily - 4 x weekly - 3 sets - 10 reps - Supine Hip Abduction  - 1 x daily - 4 x weekly - 3 sets - 10 reps - Supine Bridge  - 1 x daily - 4 x weekly - 3 sets - 3 reps - 2sec  hold  GOALS: Goals reviewed with patient? Yes  SHORT TERM GOALS: Target date: 12/12/2023  Patient will be independent in home exercise program to improve strength/mobility for better functional independence  with ADLs. Baseline: Initiated on 09/11/2023 10/22/2023: will update when appropriate 11/26/2023: patient participating in transfer and gait practice in controlled environment with 2 person assistance at home in addition to HEP Goal status: IN PROGRESS  LONG TERM GOALS: Target date: 12/31/2023  Patient will increase SIS-16 score to equal to or greater than 10points to demonstrate statistically significant improvement in mobility and quality of life.  Baseline: 49/80 09/19/2023: 45/80 10/22/2023: 46/80 11/26/2023: 47/80 Goal status: IN PROGRESS  2.  Patient (> 67 years old) will complete five times sit to stand test in < 15 seconds indicating an increased LE strength and improved balance. Baseline: 1:61min 09/19/2023: 56.39 seconds using R UE support to push-up from armrest of green chair and requiring skilled min A for balance and lifting/lowering  10/22/2023: 27.10 seconds using R UE support to push-up from armrest  8/12: 27.7 sec with RUE pushing from arm rest.  11/26/2023: 37.42 seconds using R UE support to push-up from armrest of green chair and not using L UE support with min-modA for facilitating anterior weight shift and lifting to come to stand secondary  to fatigue (would benefit from re-testing at next session) Goal status: IN PROGRESS  3.  Patient will increase FIST score by > 6 points to demonstrate decreased fall risk during functional activities Baseline:  43/56  09/23/2023: 53/56 Goal status: MET and UPGRADED  09/23/2023: Patient will increase Berg Balance score to > 45/56 to demonstrate improved balance and decreased fall risk during functional activities and ADLs.  Baseline: 09/23/2023: 14/56 7/31: 20/56 using RW support 11/26/2023: 17/56 without AD support Goal status: IN PROGRESS  4.  Patient will increase 10 meter walk test to >0.45m/s as to improve gait speed for better community ambulation and to reduce fall risk. Baseline: 09/09/2023: 0.103 m/s ( and 37 seconds) using bari-RW with skilled heavy min A and +2 w/c follow for safety  09/19/2023: 0.47m/s using bari-RW with skilled min A of 1 and +2 w/c follow for safety 10/22/2023: 0.1875 m/s using bari-RW with L hand splint, L swedish knee cage, and only skilled light min A of 1 (no wheelchair follow)  8/12: 0.157m/s with RW, and L hand splint. No knee cage on this day.  11/26/2023: Average Normal speed: 0.20 m/s & Average Fast speed: 0.275 m/s using bari-RW with CGA/min A for balance wearing SKC Goal status: IN PROGRESS  5.  Patient will reduce timed up and go to <11 seconds to reduce fall risk and demonstrate improved transfer/gait ability. Baseline: unable to complete turn at 10 ft. Mod-max assist due to poor control of LW on RW, resulting in L lateral LOB  09/09/2023: and 32 seconds using bari-RW with L hand splint and +2 on R side for safety, therapist providing skilled min A on L side (Had pt set-up L hand on RW splint, get L foot in proper positioning, and scoot forward in seat before starting the timer) 09/19/2023: 65min43 seconds using bari-RW with only skilled min A of 1 (Had pt set-up L hand on RW splint, get L foot in proper positioning, and scoot forward in seat before  starting the timer) 7/2: 78sec(1:18.125) with RW and min assist overall.  10/22/2023: 1 min, 30 seconds using bari-RW with L hand splint and skilled light min assist  8/12: 1:01 min with RW and L hand splint.  11/26/2023: 51.57 seconds using bari-RW with hand splint, wearing SKC, and CGA-minA for balance Goal status: IN PROGRESS   ASSESSMENT:  CLINICAL IMPRESSION:  Patient arrives highly motivated to participate in therapy session and increase his independence with functional mobility at an ambulatory level. Therapy session focused on re-assessment of standardized outcome measures and subjective questionnaire to determine pt's progress with therapy thus far. Patient has follow-up with [facility] tomorrow, Wednesday 9/3, to receive his XL SKC, which will further help to improve his gait and transfers. Patient demonstrates significant improvement on both as well as TUG using bari-RW with no more than min A of 1 person! Patient is now able to safely participate in the fast gait speed assessment as part of the due to improved balance and L LE NMR to complete safe and quicker gait mechanics. Patient also is able to complete all 4 items of without seated rest break! Patient demonstrates significant improvement on TUG indicating improved dynamic balance to complete turning and improved speed of transfers. Patient did not show an improvement on 5xSTS today; however, anticipate that may have been due to fatigue towards end of session and/or due to patient having a negative transfer experience immediately prior to therapy session today, impacting his motor planning and sequencing of sit<>stand transfers during therapy. Patient continues to show slow and stable improvements on both SIS-16 and Berg Balance test as these tests show improvements with even small changes in their scores. Patient will benefit from continued focus on higher intensity gait training as well as dynamic gait training  including lateral stepping, backwards, and obstacle navigation to promote improved L LE motor plan for more consistent foot placement during swing advancement, given impaired sensation. Mr. Rowe will benefit from further skilled PT to improve these deficits in order to increase QOL, decrease fall risk, increase independence with functional mobility, and increase independence with ADLs. Patient's condition has the potential to improve in response to therapy. Maximum improvement is yet to be obtained. The anticipated improvement is attainable and reasonable in a generally predictable time.     OBJECTIVE IMPAIRMENTS: Abnormal gait, cardiopulmonary status limiting activity, decreased activity tolerance, decreased balance, decreased cognition, decreased coordination, decreased endurance, decreased knowledge of condition, decreased knowledge of use of DME, decreased mobility, difficulty walking, decreased ROM, decreased strength, decreased safety awareness, dizziness, hypomobility, increased fascial restrictions, impaired perceived functional ability, increased muscle spasms, impaired sensation, impaired tone, impaired UE functional use, impaired vision/preception, improper body mechanics, postural dysfunction, and obesity.   ACTIVITY LIMITATIONS: carrying, lifting, bending, sitting, standing, squatting, stairs, transfers, bed mobility, bathing, toileting, dressing, reach over head, hygiene/grooming, locomotion level, and caring for others  PARTICIPATION LIMITATIONS: meal prep, cleaning, laundry, medication management, interpersonal relationship, driving, shopping, community activity, and yard work  PERSONAL FACTORS: Age, Past/current experiences, Time since onset of injury/illness/exacerbation, and 3+ comorbidities: MG, HTN, CVA, lymphedema are also affecting patient's functional outcome.   REHAB POTENTIAL: Good  CLINICAL DECISION MAKING: Unstable/unpredictable  EVALUATION COMPLEXITY: High  PLAN:  PT  FREQUENCY: 1-2x/week  PT DURATION: 8 weeks  PLANNED INTERVENTIONS: 97164- PT Re-evaluation, 97750- Physical Performance Testing, 97110-Therapeutic exercises, 97530- Therapeutic activity, [id number]- Neuromuscular re-education, 97535- Self Care, [id number]- Manual therapy, [id number]- Gait training, [id number]- Orthotic Initial, [id number]- Orthotic/Prosthetic subsequent, [phone]- Canalith repositioning, H9716- Electrical stimulation (unattended), [phone]- Electrical stimulation (manual), [id number]- Wound care (first 20 sq cm), 97598- Wound care (each additional 20 sq cm), Patient/Family education, Balance training, Stair training, Taping, Dry Needling, Joint mobilization, Joint manipulation, Vestibular training, Visual/preceptual remediation/compensation, Cognitive remediation, DME instructions, Wheelchair mobility training, Cryotherapy, and Moist heat  PLAN FOR NEXT SESSION:  Re-test 5xSTS Review proper mechanics/sequencing HIGT (high intensity gait  training) using litegait harness either on treadmill vs overground Dynamic gait training using bari-RW vs litegait Backwards Side stepping Obstacle navigation Working on forward propulsion with increased L hip extension and forward progression of pelvis of L stance phase Dynamic standing balance of trunk rotation    Waris Rodger, PT, DPT, NCS, CSRS Physical Therapist - [place]  [facility]  4:41 PM 11/26/23

## 2023-11-28 ENCOUNTER — Ambulatory Visit

## 2023-11-28 ENCOUNTER — Encounter: Admitting: Speech Pathology

## 2023-11-28 ENCOUNTER — Ambulatory Visit: Admitting: Physical Therapy

## 2023-11-28 DIAGNOSIS — R278 Other lack of coordination: Secondary | ICD-10-CM

## 2023-11-28 DIAGNOSIS — I69354 Hemiplegia and hemiparesis following cerebral infarction affecting left non-dominant side: Secondary | ICD-10-CM

## 2023-11-28 DIAGNOSIS — M6281 Muscle weakness (generalized): Secondary | ICD-10-CM

## 2023-11-28 DIAGNOSIS — R2681 Unsteadiness on feet: Secondary | ICD-10-CM

## 2023-11-28 DIAGNOSIS — R269 Unspecified abnormalities of gait and mobility: Secondary | ICD-10-CM

## 2023-11-28 DIAGNOSIS — R262 Difficulty in walking, not elsewhere classified: Secondary | ICD-10-CM

## 2023-11-28 NOTE — Therapy (Signed)
 OUTPATIENT PHYSICAL THERAPY TREATMENT  Patient Name: [name]. MRN: [id number] DOB:1950/04/10, 73 y.o., male Today's Date: 11/28/2023 PCP: Dennise Remak, MD  REFERRING PROVIDER:    Pegge Toribio PARAS, PA-C    END OF SESSION:    PT End of Session - 11/28/23 1402     Visit Number 31    Number of Visits 40    Date for PT Re-Evaluation 12/31/23    Authorization Type [employer] Medicare    Authorization Time Period [facility] auth#: [id number] for 6 PT vsts from 8/21-9/18 (9/3 is 5 of 6)    Progress Note Due on Visit 30    PT Start Time 1400    PT Stop Time 1446    PT Time Calculation (min) 46 min    Equipment Utilized During Treatment Gait belt    Activity Tolerance Patient tolerated treatment well;No increased pain    Behavior During Therapy WFL for tasks assessed/performed             Past Medical History:  Diagnosis Date   Anemia    Cervical myelopathy (HCC)    Chronic venous insufficiency    [name] (chronic kidney disease), stage III (HCC)    Diabetes mellitus without complication (HCC)    GERD (gastroesophageal reflux disease)    Hypercholesteremia    Hypothyroidism    Kidney stones    about 50 in the past--last in March 4/24   Leg weakness, bilateral    due to Myasthenia Gravis   Myasthenia gravis (HCC)    Spinal stenosis    Vitamin B 12 deficiency    Vitamin B 12 deficiency    Wears hearing aid    bilateral   Past Surgical History:  Procedure Laterality Date   BACK SURGERY  09/2015   CARDIAC CATHETERIZATION     CATARACT EXTRACTION W/ INTRAOCULAR LENS IMPLANT     CATARACT EXTRACTION W/PHACO Right 12/21/2015   Procedure: CATARACT EXTRACTION PHACO AND INTRAOCULAR LENS PLACEMENT (IOC);  Surgeon: Dene Etienne, MD;  Location: [facility];  Service: Ophthalmology;  Laterality: Right;  DIABETIC - insulin  and oral meds   COLONOSCOPY     SHOULDER SURGERY     labrum repair   TONSILLECTOMY     Patient Active Problem List   Diagnosis Date Noted    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) 10/11/2023   High blood pressure 08/13/2023   Anemia 08/13/2023   Constipation 08/13/2023   Myasthenia gravis (HCC) 08/13/2023   Acute ischemic right MCA stroke (HCC) 07/19/2023   Lymphedema 12/27/2016   Leg pain 07/15/2016   Chronic venous insufficiency 07/15/2016   Swelling of limb 07/15/2016   Type 2 diabetes mellitus (HCC) 07/15/2016   Hyperlipidemia 07/15/2016    ONSET DATE: 07/19/23  REFERRING DIAG:  Diagnosis  I63.511 (ICD-10-CM) - Cerebral infarction due to unspecified occlusion or stenosis of right middle cerebral artery    THERAPY DIAG:  Muscle weakness (generalized)  Other lack of coordination  Hemiplegia and hemiparesis following cerebral infarction affecting left non-dominant side (HCC)  Difficulty in walking, not elsewhere classified  Unsteadiness on feet  Abnormality of gait and mobility  Rationale for Evaluation and Treatment: Rehabilitation  SUBJECTIVE:  SUBJECTIVE STATEMENT:   Pt reports at his follow-up appointment with [facility] yesterday they said his current [facility] now fits appropriately because he has lost a lot of the fluid in his LEs. Patient states he wants to be able to try stair navigation.  Pt states he hasn't walked at home since last therapy session due to his legs not feeling strong. Pt states his legs are feeling better today after having the new lower leg wound wraps placed on Tuesday. Denies falls. Denies pain.   Of note from last session (11/26/23): Patient reported he tried to walk from his recliner, down the distance of the couch (~63ft) and then turn around to go back to his recliner using his RW; however, reported he started to feel like he is losing his balance backwards  and so he sat down on the couch for safety, rather than trying to turn around and go back to his recliner. Reports he is trying to perform a right turn when he is turning. Reports he feels something is off with the timing of him turning the AD and stepping his feet.     PERTINENT HISTORY:  CVA with Left hemiplegia in April 2025. Pt started on a swedish knee cage at [facility] due to uncontrolled hyperextension in stance. Pt had not been moving much since discharging from [facility], as therapists instructed pt to perform transfers only at home due to high fall risk with ambulation. Continues to have significant weakenss in the LUE with difficulty extending fingers as well as numbness and inattention to the L side of body resulting in multiple cuts on Arm and leg with WC mobility in home. Will be attaining custom power WC from [employer]. Has loaner currently.   PAIN:  Are you having pain? No  PRECAUTIONS: Fall, history of wounds on LLE   *Latex allergy   WEIGHT BEARING RESTRICTIONS: No  FALLS: Has patient fallen in last 6 months? Yes. Number of falls 1  LIVING ENVIRONMENT: Lives with: lives with their spouse Lives in: House/apartment Stairs: Ramps installed while in the Hospital  Has following equipment at home: Vannie - 2 wheeled and Wheelchair (power)  PLOF: Independent with basic ADLs, Independent with household mobility without device, and Independent with community mobility with device with [facility]   PATIENT GOALS: relearn to Walk 15-58ft.   OBJECTIVE:                                                                                                                              TREATMENT DATE: 11/28/2023  Pt arrives in transport chair.   Donned pt's SKC for session since pt with no adverse effects from wearing it during last session, did notice improvement in the fit today. May need to adjust [facility] posterior knee strap to provide further support to prevent hyperextension. Doffed SKC at end of session.    Sit>stands from chairs to bari-RW today with only skilled light min A - pt demonstrating improved  motor planning/sequencing coming to stand, compared to last session; however, does still tend to have minor pushing of backs of legs up against chair as he rises.   Gait to stairs using bari-RW with L hand splint and skilled light min A/CGA for safety - pt continues to demo improving L LE foot clearance, step length, and decreased tendency to excessively adduct L LE during swing advancement. Pt demos significant improvement in AD management when navigating around gym.  Stair navigation training ascending/descending 8 steps (6 height) using B HRs with skilled heavy min assist for balance, L LE management, and L hand management on rail Step-to pattern leading with R LE on ascent and L LE on descent Requires therapist to facilitate L LE advancement and foot placement on step during descent  Pt demos ability to advance L LE in reciprocal pattern on ascent, but cued patient to only perform step-to pattern today for safety, but will advance in upcoming visit when appropriate Pt's L hand frequently slipping off handrail during descent, requiring therapist to reposition it    Pt able to safely ambulate to/from litegait harness using bari-RW with skilled light min A/CGA as described above. Pt able to step on/off treadmill in litegait harness for balance support - pt catches L toes when trying to step-up onto treadmill requiring harness support for balance recovery using B UE support on handles.  Gait training the following trials on the treadmill in litegait harness providing balance support, but not true BWS, and therapist providing external target cuing to improve L LE step length.  1st trial: 3 min and 35seconds at 0.42mph to 0.68mph totaling 263ft Facilitating L hip forward as pt tends to have pelvis rotated with L hip posteriorly to R 1x needed to stop briefly and recovery LOB due to L toe catching during  swing and pt unable to recover balance quickly enough Decreased L LE foot clearance during swing Therapist providing cuing via external target for increased L LE step length and for improved L heel strike on initial contact Pt continues to have decreased L LE foot clearance during swing and decreased step length, especially with fatigue, as well as excessive adduction, but pt does achieve reciprocal stepping pattern >90% throughout Pt does continue to have heavy L foot landing on initial contact when he achieves terminal swing advancement Improving symmetry of gait with more equal stance phase times    PATIENT EDUCATION: Education details: knee cage needs assessmnet as it is causeing crossover gait.  Person educated: Patient and Spouse Education method: Explanation, VC/TC/Demo Education comprehension: verbalized understanding  HOME EXERCISE PROGRAM: Access Code: [email or web address] URL: [email or web address] Date: 09/11/2023 Prepared by: Massie Dollar  Exercises - Seated Knee Extension AROM  - 1 x daily - 4 x weekly - 3 sets - 10 reps - Seated Hip Abduction  - 1 x daily - 4 x weekly - 3 sets - 10 reps - Seated March  - 1 x daily - 4 x weekly - 3 sets - 10 reps - Sit to Stand with Counter Support  - 1 x daily - 4 x weekly - 3 sets - 10 reps - Supine Short Arc Quad  - 1 x daily - 4 x weekly - 3 sets - 10 reps - Supine Hip Abduction  - 1 x daily - 4 x weekly - 3 sets - 10 reps - Supine Bridge  - 1 x daily - 4 x weekly - 3 sets - 3 reps - 2sec  hold  GOALS: Goals  reviewed with patient? Yes  SHORT TERM GOALS: Target date: 12/12/2023  Patient will be independent in home exercise program to improve strength/mobility for better functional independence with ADLs. Baseline: Initiated on 09/11/2023 10/22/2023: will update when appropriate 11/26/2023: patient participating in transfer and gait practice in controlled environment with 2 person assistance at home in addition to HEP Goal  status: IN PROGRESS  LONG TERM GOALS: Target date: 12/31/2023  Patient will increase SIS-16 score to equal to or greater than 10points to demonstrate statistically significant improvement in mobility and quality of life.  Baseline: 49/80 09/19/2023: 45/80 10/22/2023: 46/80 11/26/2023: 47/80 Goal status: IN PROGRESS  2.  Patient (> 16 years old) will complete five times sit to stand test in < 15 seconds indicating an increased LE strength and improved balance. Baseline: 1:60min 09/19/2023: 56.39 seconds using R UE support to push-up from armrest of green chair and requiring skilled min A for balance and lifting/lowering  10/22/2023: 27.10 seconds using R UE support to push-up from armrest  8/12: 27.7 sec with RUE pushing from arm rest.  11/26/2023: 37.42 seconds using R UE support to push-up from armrest of green chair and not using L UE support with min-modA for facilitating anterior weight shift and lifting to come to stand secondary to fatigue (would benefit from re-testing at next session) Goal status: IN PROGRESS  3.  Patient will increase FIST score by > 6 points to demonstrate decreased fall risk during functional activities Baseline:  43/56  09/23/2023: 53/56 Goal status: MET and UPGRADED  09/23/2023: Patient will increase Berg Balance score to > 45/56 to demonstrate improved balance and decreased fall risk during functional activities and ADLs.  Baseline: 09/23/2023: 14/56 7/31: 20/56 using RW support 11/26/2023: 17/56 without AD support Goal status: IN PROGRESS  4.  Patient will increase 10 meter walk test to >0.5m/s as to improve gait speed for better community ambulation and to reduce fall risk. Baseline: 09/09/2023: 0.103 m/s ( and 37 seconds) using bari-RW with skilled heavy min A and +2 w/c follow for safety  09/19/2023: 0.7m/s using bari-RW with skilled min A of 1 and +2 w/c follow for safety 10/22/2023: 0.1875 m/s using bari-RW with L hand splint, L swedish knee cage, and only  skilled light min A of 1 (no wheelchair follow)  8/12: 0.150m/s with RW, and L hand splint. No knee cage on this day.  11/26/2023: Average Normal speed: 0.20 m/s & Average Fast speed: 0.275 m/s using bari-RW with CGA/min A for balance wearing SKC Goal status: IN PROGRESS  5.  Patient will reduce timed up and go to <11 seconds to reduce fall risk and demonstrate improved transfer/gait ability. Baseline: unable to complete turn at 10 ft. Mod-max assist due to poor control of LW on RW, resulting in L lateral LOB  09/09/2023: and 32 seconds using bari-RW with L hand splint and +2 on R side for safety, therapist providing skilled min A on L side (Had pt set-up L hand on RW splint, get L foot in proper positioning, and scoot forward in seat before starting the timer) 09/19/2023: 48min43 seconds using bari-RW with only skilled min A of 1 (Had pt set-up L hand on RW splint, get L foot in proper positioning, and scoot forward in seat before starting the timer) 7/2: 78sec(1:18.125) with RW and min assist overall.  10/22/2023: 1 min, 30 seconds using bari-RW with L hand splint and skilled light min assist  8/12: 1:01 min with RW and L hand splint.  11/26/2023: 51.57 seconds using bari-RW with hand splint, wearing SKC, and CGA-minA for balance Goal status: IN PROGRESS   ASSESSMENT:  CLINICAL IMPRESSION:    Patient arrives highly motivated to participate in therapy session and increase his independence with functional mobility at an ambulatory level. Patient had follow-up with [facility] yesterday, Wednesday 9/3, and reports due to him having improvement in his LE edema, his current [facility] is now the correct size. Patient able to progress to stair navigation training today!! He was able to ascend/descend 8 steps using HR support and step-to pattern with skilled min A for L LE management, balance, and L hand management. Patient also able to continue with higher intensity gait training on treadmill at increasing  speed to 0.70mph today! Patient will benefit from continued focus on higher intensity gait training as well as dynamic gait training including lateral stepping, backwards, and obstacle navigation to promote improved L LE motor plan for more consistent foot placement during swing advancement, given impaired sensation. Mr. Alcalde will benefit from further skilled PT to improve these deficits in order to increase QOL, decrease fall risk, increase independence with functional mobility, and increase independence with ADLs.   OBJECTIVE IMPAIRMENTS: Abnormal gait, cardiopulmonary status limiting activity, decreased activity tolerance, decreased balance, decreased cognition, decreased coordination, decreased endurance, decreased knowledge of condition, decreased knowledge of use of DME, decreased mobility, difficulty walking, decreased ROM, decreased strength, decreased safety awareness, dizziness, hypomobility, increased fascial restrictions, impaired perceived functional ability, increased muscle spasms, impaired sensation, impaired tone, impaired UE functional use, impaired vision/preception, improper body mechanics, postural dysfunction, and obesity.   ACTIVITY LIMITATIONS: carrying, lifting, bending, sitting, standing, squatting, stairs, transfers, bed mobility, bathing, toileting, dressing, reach over head, hygiene/grooming, locomotion level, and caring for others  PARTICIPATION LIMITATIONS: meal prep, cleaning, laundry, medication management, interpersonal relationship, driving, shopping, community activity, and yard work  PERSONAL FACTORS: Age, Past/current experiences, Time since onset of injury/illness/exacerbation, and 3+ comorbidities: MG, HTN, CVA, lymphedema are also affecting patient's functional outcome.   REHAB POTENTIAL: Good  CLINICAL DECISION MAKING: Unstable/unpredictable  EVALUATION COMPLEXITY: High  PLAN:  PT FREQUENCY: 1-2x/week  PT DURATION: 8 weeks  PLANNED INTERVENTIONS:  97164- PT Re-evaluation, 97750- Physical Performance Testing, 97110-Therapeutic exercises, 97530- Therapeutic activity, [id number]- Neuromuscular re-education, 97535- Self Care, [id number]- Manual therapy, [id number]- Gait training, [phone]- Orthotic Initial, [phone]- Orthotic/Prosthetic subsequent, [phone]- Canalith repositioning, [id number]- Electrical stimulation (unattended), [phone]- Electrical stimulation (manual), [id number]- Wound care (first 20 sq cm), 97598- Wound care (each additional 20 sq cm), Patient/Family education, Balance training, Stair training, Taping, Dry Needling, Joint mobilization, Joint manipulation, Vestibular training, Visual/preceptual remediation/compensation, Cognitive remediation, DME instructions, Wheelchair mobility training, Cryotherapy, and Moist heat  PLAN FOR NEXT SESSION:   Re-test 5xSTS & update LTG Review proper mechanics/sequencing of transfers  HIGT (high intensity gait training) using litegait harness either on treadmill vs overground Dynamic gait training using bari-RW vs litegait Backwards Side stepping Obstacle navigation (turning) Working on forward propulsion with increased L hip extension and forward progression of pelvis of L stance phase Dynamic standing balance of trunk rotation Re-assess SKC fit & see if posterior knee strap needs to be tightened    Connell Kiss, PT, DPT, NCS, CSRS Physical Therapist - [place]  [facility]  3:24 PM 11/28/23

## 2023-11-29 NOTE — Therapy (Signed)
 Occupational Therapy Neuro Treatment Note  Patient Name: [name]. MRN: [id number] DOB:1950-12-16, 73 y.o., male Today's Date: 11/29/2023  PCP: Alm Rower, MD REFERRING PROVIDER: Hermann Toribio PARAS, PA-C  END OF SESSION:  OT End of Session - 11/29/23 1137     Visit Number 29    Number of Visits 48    Date for OT Re-Evaluation 01/21/24    OT Start Time 1315    OT Stop Time 1400    OT Time Calculation (min) 45 min    Activity Tolerance Patient tolerated treatment well    Behavior During Therapy WFL for tasks assessed/performed         Past Medical History:  Diagnosis Date   Anemia    Cervical myelopathy (HCC)    Chronic venous insufficiency    [name] (chronic kidney disease), stage III (HCC)    Diabetes mellitus without complication (HCC)    GERD (gastroesophageal reflux disease)    Hypercholesteremia    Hypothyroidism    Kidney stones    about 50 in the past--last in March 4/24   Leg weakness, bilateral    due to Myasthenia Gravis   Myasthenia gravis (HCC)    Spinal stenosis    Vitamin B 12 deficiency    Vitamin B 12 deficiency    Wears hearing aid    bilateral   Past Surgical History:  Procedure Laterality Date   BACK SURGERY  09/2015   CARDIAC CATHETERIZATION     CATARACT EXTRACTION W/ INTRAOCULAR LENS IMPLANT     CATARACT EXTRACTION W/PHACO Right 12/21/2015   Procedure: CATARACT EXTRACTION PHACO AND INTRAOCULAR LENS PLACEMENT (IOC);  Surgeon: Dene Etienne, MD;  Location: [facility];  Service: Ophthalmology;  Laterality: Right;  DIABETIC - insulin  and oral meds   COLONOSCOPY     SHOULDER SURGERY     labrum repair   TONSILLECTOMY     Patient Active Problem List   Diagnosis Date Noted   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) 10/11/2023   High blood pressure 08/13/2023   Anemia 08/13/2023   Constipation 08/13/2023   Myasthenia gravis (HCC) 08/13/2023   Acute ischemic right MCA stroke  (HCC) 07/19/2023   Lymphedema 12/27/2016   Leg pain 07/15/2016   Chronic venous insufficiency 07/15/2016   Swelling of limb 07/15/2016   Type 2 diabetes mellitus (HCC) 07/15/2016   Hyperlipidemia 07/15/2016   ONSET DATE: 07/13/2023  REFERRING DIAG: Acute ischemic R MCA CVA  THERAPY DIAG: muscle weakness (generalized), other lack of coordination, vision disturbance, hemiplegia and hemiparesis following cerebral infarction affecting left non-dominant side (HCC)  Rationale for Evaluation and Treatment: Rehabilitation  SUBJECTIVE:  SUBJECTIVE STATEMENT: Pt reports that he and his family have observed continued improvements with pt's ability to use his L hand.  Pt specifically reports improved accuracy when he reaches for objects or parts of his body using his L hand. Pt accompanied by: [name]  PERTINENT HISTORY: Pt. Was admitted to [facility] on 07/13/2023 after sustaining a CVA while on a trip to the beach. Pt. Was diagnosed with R MCA CVA. Pt. Was admitted to inpatient rehab from 07/19/2023-08/13/2023. Past medical history includes high BP, anemia, and Myasthenia Gravis.   PRECAUTIONS: None  WEIGHT BEARING RESTRICTIONS: No  PAIN:  11/28/23: 0/10 pain in BLEs today, mild pain in L wrist/volar forearm with end range wrist and digit ext  FALLS: Has patient fallen in last 6 months? Yes  LIVING ENVIRONMENT: Lives with: lives with their  family and lives with their spouse-  Lives in: House/apartment- 2 story home and resides mostly on the 1st floor Stairs: No- Ramped entrance Has following equipment at home:  Vannie, cane, Steadi, shower chair, handheld shower head, bedside commode  PLOF: Independent and Independent with basic ADLs Independent at home   PATIENT GOALS: Would like to walk 10-20 feet each time.   OBJECTIVE:  Note: Objective measures were completed at Evaluation unless otherwise noted.  HAND DOMINANCE: Right  ADLs: Overall ADLs:  Transfers/ambulation related to  ADLs: Eating: Independent, 100% R handed Grooming: independent UB Dressing: Can put on shirt independently LB Dressing: Difficulty with pants and requires assistance getting the pants on his feet, has difficulty putting on shoes and socks Toileting: Tot A for toilet hygiene. Uses Steadi during toilet transfers. Bathing: Requires assist with using the L arm to wash the R arm. Tub Shower transfers: Roll in shower, built in shower chair, grab bars and hand held shower head.  IADLs: Shopping: Total A (Wife typically did the shopping prior to onset)   Light housekeeping: Total A (wife typically performs prior to onset) Meal Prep: Independent with light snack prep Community mobility: No driving, able to get in/out of the car Medication management: Set up assistance from his wife using a pill box (pt. Is responsible for taking medications at the correct time) Financial management: family members check behind him. Handwriting: TBD Hobbies: gardening, wood working and sports- [employer] Work history: Owns a tax business  MOBILITY STATUS: Needs Assist:    POSTURE COMMENTS:   Sitting balance: Good supported sitting balance  FUNCTIONAL OUTCOME MEASURES: TBD  UPPER EXTREMITY ROM:    Active ROM Right Eval  [facility] Right 09/23/2023 [facility] Left eval Left 09/23/2023 Left 10/29/23  Shoulder flexion   89(110) 109(114) 111(115)  Shoulder abduction   98(120) 109(120) 111(122)  Shoulder adduction       Shoulder extension       Shoulder internal rotation       Shoulder external rotation       Elbow flexion   120(140) 130(136) 141(145)  Elbow extension   -28(-10) -5(0) -18(0)  Wrist flexion   60(60) Rest at 60 50(65) 54(60)  Wrist extension   -48(42) 10(40) 20(50)  Wrist ulnar deviation     12(22)  Wrist radial deviation     10(20)  Wrist pronation     (82)90  Wrist supination     64(74)  (Blank rows = not tested)  Eval: L Digit flexion: 2nd:3cm (0cm) 3rd: 0cm (0cm), 4th: 0cm (0cm), 5th: 2cm  (0cm)  09/23/23:  L Digit flexion to [facility]: 2nd: 3.5cm (0cm), 3rd: 4 cm(0cm), 4th 3.5cm (0cm), 5th: 2cm (0cm)  Eval:  L Digit extension: Active Gross digit extension through 10% of ROM  09/23/23:  L Digit extension: Active Gross digit extension through 50% of ROM  10/29/23:  L Digit extension: Active Gross digit extension through 25-50% of ROM  UPPER EXTREMITY MMT:     MMT Right eval Left eval Left 09/23/2023 Left 10/29/23  Shoulder flexion 5 3- 3-/5 3-/5  Shoulder abduction 5 3- 3-/5 3-/5  Shoulder adduction      Shoulder extension      Shoulder internal rotation      Shoulder external rotation      Middle trapezius      Lower trapezius      Elbow flexion 5 TBD 3+/5 4-/5  Elbow extension 5 TBD 4-/5 3-/5  Wrist flexion   2/5 3-/5  Wrist extension  2- 2/5 2+/5  Wrist ulnar deviation      Wrist radial deviation      Wrist pronation    3/5  Wrist supination    3-/5  (Blank rows = not tested)  HAND FUNCTION:  Eval:  Grip strength: Right: 94#, Left: 1#,   Lateral Key Pinch strength: Right: 14#, Left: 3#  10/29/23:   Grip strength: Left: 16#  Lateral Key Pinch strength: Left: 5#  COORDINATION:  Box and Blocks: Right: 34 blocks completed in 1 min., Left: 0 blocks completed in 1 min.   10/29/23:  Box and Blocks: Right: 34 blocks completed in 1 min., Left: 2 blocks completed in 1 min.  9 Hole Peg Test:  Left: To remove 9 pegs from a vertical position: 1 min. &13 sec.  SENSATION: Sensation feels different in both hands.  L hand Light touch- impaired L hand Proprioception- impaired  EDEMA: Has had hx of edema, and came to Eval session with swelling in the L hand/wrist/forearm.   *pt. Has a laceration from his dog on the dorsal aspect of the L hand.   MUSCLE TONE: Increased tone through the UE and hand flexors.   COGNITION: Overall cognitive status: Within functional limits for tasks assessed  VISION: Subjective report: L sided inattention Baseline  vision: Wears glasses for reading only Visual history: Right visual occlusion  VISION ASSESSMENT: To be assessed  PERCEPTION: TBD  PRAXIS: Impaired                                                                                                                 TREATMENT DATE: 11/28/2023 Therapeutic Exercise: -Performed passive L wrist and digit ext stretching intermittently throughout session to decreased stiffness in prep for grasping and releasing ADL supplies from L hand. -Performed closed chain/yellow theraband for L elbow flex/ext, L shoulder flex, abd, ER.  OT providing mod A for positioning to reduce substitution patterns with above noted exercises.  Pt completed 2 sets 10 reps each.    Neuro re-ed: -Focus on LUE FMC/GMC working to move [employer] levels 1-2 of [employer].  Min A on 75% of trials, mod vc on 25% of trials.   (Utilized Estim to promote grasp/release during this activity: Tx time: 10 min, 30 mA CC, duty cycle 50%, ramp 5 sec, cycle time 10/10; electrodes placed at proximal and distal forearm, dorsal aspect, to promote wrist and digit extension)  Self Care: -Fork/knife skills practice; built up foam handles placed overtop utensils in L hand.  Pt practiced cutting theraputty with table knife in R hand, fork to stabilize food in L hand.  Min A to keep fork vertical d/t weak supinators, and assist to stabilize plate despite Dycem beneath plate -Practice trials with use of L hand to grasp fork, stab food (putty pieces), and perform hand to mouth patterns; Min to to reposition fork to angle towards mouth, and to stabilize fork while stabbing putty.  Frequent assist for repositioning fork in L hand d/t difficult  maintaining secure grasp.  Able to maintain grasp for short periods and mod vc for squeezing fork in L hand.   -Encouraged continued attempts with use of foam handles over utensils at home, and attempts at using L hand for larger finger foods.    Education: Education details: Self feeding strategies Person educated: Patient and spouse Education method: Explanation and Verbal cues, return demo Education comprehension: verbalized understanding and needs further education  HOME EXERCISE PROGRAM: -grasping/releasing tennis ball sized circular sphere, holding while performing wrist extension -Repetitions for grasping/releasing 1 blocks.  GOALS: Goals reviewed with patient? Yes  SHORT TERM GOALS: Target date:.   Pt. Will be independent with HEP for the LUE.  Baseline: 10/29/23: Pt. requires assist from wife with HEPs. Pt. Is consistently working on HEP's at home. 09/19/23: Requires assist from his wife Eval; no current HEP Goal status: Ongoing  LONG TERM GOALS: Target date: 01/21/2024  Pt. Will increase L shoulder flexion by 10 degrees to be able to reach up to shelves.  Baseline: 10/29/23: 888(884) 09/19/23: Pt. Continues to be limited with reaching up to shelves. Eval: L shoulder flexion is 89 (110). Goal status: Ongoing  2.  Pt. Will increase L shoulder abduction by 10 degrees to assist with underarm hygiene.  Baseline:10/29/23: 888(877) 09/19/23: Pt. Continues to require assist with underarm hygiene.Eval: L shoulder abduction is 98 (120).  Goal status: Ongoing  3.  Pt. Will improve L wrist extension by 10 degrees to be able to initiate anticipation of grasping for objects.  Baseline: 10/29/23: 20(55) 09/19/23: Consistent activation noted in the left wrist extensors with facilitation. Eval: -48 (42) Goal status: Ongoing  4.  Pt. Will improve active gross digit extension through 50% of the range to be able to release objects in his hand consistently.  Baseline: 10/28/20: Gross digit extension through 25-50% of the range. 09/19/23: gross digit extension noted through 25% range with facilitation.Eval: gross digit extension is 10% of the range  Goal status: Goal updated to 50% of the range  5.  Pt. Will independently demonstrate  visual compensatory strategies when navigating through environment and during tabletop tasks. Baseline: 10/29/2023: Pt. Continues to less cuing for left sided awareness. 09/19/23: Pt. requires fewer cues for left sided awareness Eval: Pt. Requires consistent cuing for L sided awareness.  Goal status: Ongoing   6. Pt. Will improve left hand [facility] skills to be able to manipulate small objects during ADLs, and IADLs.   Baseline: 10/29/23: 9 Hole Peg Test: Pt. Was able to remove 9 pegs from vertical position on the pegboard in 1 min. & 13 sec. Pt. Is not yet able to pick up pegs from a horizontal position to place them in the pegboard.   Goal status: New   7. Pt. Will improve left hand function skills as evidence by improve score to 4 blocks on the Blocks, and Box test.   Baseline: 10/29/23: Box and Blocks Test: 2 Blocks completed in 1 min.   Goal Status: New  ASSESSMENT: CLINICAL IMPRESSION:  Pt continues to make steady gains with LUE function, and reports that he can see he is improving his accuracy when he reaches with his L hand.  Focus on fork/knife skills today, with pt requiring Min A to keep fork vertical d/t weak supinators when stabilizing food with fork in L hand while R hand cuts with knife, also requiring assist to stabilize plate despite Dycem beneath plate.  Min A to maintain fork grasp while stabbing putty pieces, and frequent repositioning  assist d/t fork sliding from hand.  Pt can secure L grip when cued, but then grip loosens with multitasking.  Pt with good tolerance for yellow theraband today for above noted exercises, requiring mod A for positioning to reduce substitution patterns.  OT advised theraband is not yet recommended for HEP until pt is able to perform exercises more independently as to avoid injury.  Pt verbalized understanding.  Pt continues to benefit from OT services to work on improving LUE functioning to increase engagement of the LUE during daily task, and to provide  education about compensatory strategies during ADLs/IADLs.  PERFORMANCE DEFICITS: in functional skills including ADLs, IADLs, coordination, dexterity, proprioception, sensation, edema, tone, ROM, strength, pain, Fine motor control, Gross motor control, mobility, balance, endurance, vision, and UE functional use, cognitive skills including perception, and psychosocial skills including coping strategies, environmental adaptation, and routines and behaviors.   IMPAIRMENTS: are limiting patient from ADLs, IADLs, and leisure.   CO-MORBIDITIES: may have co-morbidities  that affects occupational performance. Patient will benefit from skilled OT to address above impairments and improve overall function.  MODIFICATION OR ASSISTANCE TO COMPLETE EVALUATION: Min-Moderate modification of tasks or assist with assess necessary to complete an evaluation.  OT OCCUPATIONAL PROFILE AND HISTORY: Detailed assessment: Review of records and additional review of physical, cognitive, psychosocial history related to current functional performance.  CLINICAL DECISION MAKING: Moderate - several treatment options, min-mod task modification necessary  REHAB POTENTIAL: Good  EVALUATION COMPLEXITY: Moderate  PLAN:  OT FREQUENCY: 2x/week  OT DURATION: 12 weeks  PLANNED INTERVENTIONS: 97168 OT Re-evaluation, 97535 self care/ADL training, 02889 therapeutic exercise, 97530 therapeutic activity, 97112 neuromuscular re-education, 97140 manual therapy, 97018 paraffin, 02989 moist heat, 97010 cryotherapy, 97034 contrast bath, 97760 Orthotic Initial, 97763 Orthotic/Prosthetic subsequent, passive range of motion, visual/perceptual remediation/compensation, energy conservation, and DME and/or AE instructions  RECOMMENDED OTHER SERVICES: PT and ST  CONSULTED AND AGREED WITH PLAN OF CARE: Patient and family member/caregiver  PLAN FOR NEXT SESSION: see above  Inocente Blazing, MS, OTR/L

## 2023-12-03 ENCOUNTER — Encounter: Admitting: Speech Pathology

## 2023-12-03 ENCOUNTER — Encounter: Admitting: Physician Assistant

## 2023-12-03 ENCOUNTER — Ambulatory Visit

## 2023-12-03 DIAGNOSIS — I69354 Hemiplegia and hemiparesis following cerebral infarction affecting left non-dominant side: Secondary | ICD-10-CM

## 2023-12-03 DIAGNOSIS — E11622 Type 2 diabetes mellitus with other skin ulcer: Secondary | ICD-10-CM | POA: Diagnosis not present

## 2023-12-03 DIAGNOSIS — M6281 Muscle weakness (generalized): Secondary | ICD-10-CM | POA: Diagnosis not present

## 2023-12-03 DIAGNOSIS — R269 Unspecified abnormalities of gait and mobility: Secondary | ICD-10-CM

## 2023-12-03 DIAGNOSIS — R2681 Unsteadiness on feet: Secondary | ICD-10-CM

## 2023-12-03 DIAGNOSIS — R278 Other lack of coordination: Secondary | ICD-10-CM

## 2023-12-03 DIAGNOSIS — R262 Difficulty in walking, not elsewhere classified: Secondary | ICD-10-CM

## 2023-12-03 NOTE — Therapy (Unsigned)
 Occupational Therapy Neuro Treatment Note  Patient Name: [name]. MRN: [id number] DOB:1950-07-18, 73 y.o., male Today's Date: 12/03/2023  PCP: Alm Rower, MD REFERRING PROVIDER: Hermann Toribio PARAS, PA-C  END OF SESSION:   Past Medical History:  Diagnosis Date   Anemia    Cervical myelopathy (HCC)    Chronic venous insufficiency    [name] (chronic kidney disease), stage III (HCC)    Diabetes mellitus without complication (HCC)    GERD (gastroesophageal reflux disease)    Hypercholesteremia    Hypothyroidism    Kidney stones    about 50 in the past--last in March 4/24   Leg weakness, bilateral    due to Myasthenia Gravis   Myasthenia gravis (HCC)    Spinal stenosis    Vitamin B 12 deficiency    Vitamin B 12 deficiency    Wears hearing aid    bilateral   Past Surgical History:  Procedure Laterality Date   BACK SURGERY  09/2015   CARDIAC CATHETERIZATION     CATARACT EXTRACTION W/ INTRAOCULAR LENS IMPLANT     CATARACT EXTRACTION W/PHACO Right 12/21/2015   Procedure: CATARACT EXTRACTION PHACO AND INTRAOCULAR LENS PLACEMENT (IOC);  Surgeon: Dene Etienne, MD;  Location: [facility];  Service: Ophthalmology;  Laterality: Right;  DIABETIC - insulin  and oral meds   COLONOSCOPY     SHOULDER SURGERY     labrum repair   TONSILLECTOMY     Patient Active Problem List   Diagnosis Date Noted   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) 10/11/2023   High blood pressure 08/13/2023   Anemia 08/13/2023   Constipation 08/13/2023   Myasthenia gravis (HCC) 08/13/2023   Acute ischemic right MCA stroke (HCC) 07/19/2023   Lymphedema 12/27/2016   Leg pain 07/15/2016   Chronic venous insufficiency 07/15/2016   Swelling of limb 07/15/2016   Type 2 diabetes mellitus (HCC) 07/15/2016   Hyperlipidemia 07/15/2016   ONSET DATE: 07/13/2023  REFERRING DIAG: Acute ischemic R MCA CVA  THERAPY DIAG: muscle weakness (generalized),  other lack of coordination, vision disturbance, hemiplegia and hemiparesis following cerebral infarction affecting left non-dominant side (HCC)  Rationale for Evaluation and Treatment: Rehabilitation  SUBJECTIVE:  SUBJECTIVE STATEMENT: Pt reports that he and his family have observed continued improvements with pt's ability to use his L hand.  Pt specifically reports improved accuracy when he reaches for objects or parts of his body using his L hand. Pt accompanied by: [name]  PERTINENT HISTORY: Pt. Was admitted to [facility] on 07/13/2023 after sustaining a CVA while on a trip to the beach. Pt. Was diagnosed with R MCA CVA. Pt. Was admitted to inpatient rehab from 07/19/2023-08/13/2023. Past medical history includes high BP, anemia, and Myasthenia Gravis.   PRECAUTIONS: None  WEIGHT BEARING RESTRICTIONS: No  PAIN:  11/28/23: 0/10 pain in BLEs today, mild pain in L wrist/volar forearm with end range wrist and digit ext  FALLS: Has patient fallen in last 6 months? Yes  LIVING ENVIRONMENT: Lives with: lives with their family and lives with their spouse-  Lives in: House/apartment- 2 story home and resides mostly on the 1st floor Stairs: No- Ramped entrance Has following equipment at home:  Vannie, cane, Steadi, shower chair, handheld shower head, bedside commode  PLOF: Independent and Independent with basic ADLs Independent at home   PATIENT GOALS: Would like to walk 10-20 feet each time.   OBJECTIVE:  Note: Objective measures were completed at Evaluation unless otherwise noted.  HAND DOMINANCE: Right  ADLs: Overall ADLs:  Transfers/ambulation related to ADLs: Eating: Independent, 100% R handed Grooming: independent UB Dressing: Can put on shirt independently LB Dressing: Difficulty with pants and requires assistance getting the pants on his feet, has difficulty putting on shoes and socks Toileting: Tot A for toilet hygiene. Uses Steadi during toilet transfers. Bathing: Requires  assist with using the L arm to wash the R arm. Tub Shower transfers: Roll in shower, built in shower chair, grab bars and hand held shower head.  IADLs: Shopping: Total A (Wife typically did the shopping prior to onset)   Light housekeeping: Total A (wife typically performs prior to onset) Meal Prep: Independent with light snack prep Community mobility: No driving, able to get in/out of the car Medication management: Set up assistance from his wife using a pill box (pt. Is responsible for taking medications at the correct time) Financial management: family members check behind him. Handwriting: TBD Hobbies: gardening, wood working and sports- [employer] Work history: Owns a tax business  MOBILITY STATUS: Needs Assist:    POSTURE COMMENTS:   Sitting balance: Good supported sitting balance  FUNCTIONAL OUTCOME MEASURES: TBD  UPPER EXTREMITY ROM:    Active ROM Right Eval  [facility] Right 09/23/2023 [facility] Left eval Left 09/23/2023 Left 10/29/23 Left 12/02/23  Shoulder flexion   89(110) 109(114) 111(115) 90 before scaption (110)  Shoulder abduction   98(120) 109(120) 111(122) 100 (110)  Shoulder adduction        Shoulder extension        Shoulder internal rotation        Shoulder external rotation        Elbow flexion   120(140) 130(136) 141(145)   Elbow extension   -28(-10) -5(0) -18(0)   Wrist flexion   60(60) Rest at 60 50(65) 54(60)   Wrist extension   -48(42) 10(40) 20(50) 30 (45)  Wrist ulnar deviation     12(22)   Wrist radial deviation     10(20)   Wrist pronation     (82)90   Wrist supination     64(74) 70 (75)  (Blank rows = not tested)  Eval: L Digit flexion: 2nd:3cm (0cm) 3rd: 0cm (0cm), 4th: 0cm (0cm), 5th: 2cm (0cm)  09/23/23:  L Digit flexion to [facility]: 2nd: 3.5cm (0cm), 3rd: 4 cm(0cm), 4th 3.5cm (0cm), 5th: 2cm (0cm)  Eval:  L Digit extension: Active Gross digit extension through 10% of ROM  09/23/23:  L Digit extension: Active Gross digit extension through 50% of  ROM  10/29/23:  L Digit extension: Active Gross digit extension through 25-50% of ROM  12/02/23:  L digit extension  Active gross ext through 30%   UPPER EXTREMITY MMT:     MMT Right eval Left eval Left 09/23/2023 Left 10/29/23 Left 12/02/23  Shoulder flexion 5 3- 3-/5 3-/5   Shoulder abduction 5 3- 3-/5 3-/5   Shoulder adduction       Shoulder extension       Shoulder internal rotation       Shoulder external rotation       Middle trapezius       Lower trapezius       Elbow flexion 5 TBD 3+/5 4-/5 4+/5  Elbow extension 5 TBD 4-/5 3-/5 4+/5  Wrist flexion   2/5 3-/5 3-  Wrist extension  2- 2/5 2+/5 3-  Wrist ulnar deviation       Wrist radial deviation       Wrist pronation  3/5 3+  Wrist supination    3-/5 3-  (Blank rows = not tested)  HAND FUNCTION:  Eval:  Grip strength: Right: 94#, Left: 1#,   Lateral Key Pinch strength: Right: 14#, Left: 3#  10/29/23:   Grip strength: Left: 16#  Lateral Key Pinch strength: Left: 5#  12/02/23: R 98 lbs; L: 5 lbs   Lateral pinch: Left: 5 lbs   COORDINATION:  Box and Blocks: Right: 34 blocks completed in 1 min., Left: 0 blocks completed in 1 min.   10/29/23:  Box and Blocks: Right: 34 blocks completed in 1 min., Left: 2 blocks completed in 1 min.  9 Hole Peg Test:  Left: To remove 9 pegs from a vertical position: 1 min. &13 sec.  12/02/23: Box and Blocks: 4 blocks 12/02/23: 4 pegs out in 3 min (multiple pulled and dropped out of the cup)   SENSATION: Sensation feels different in both hands.  L hand Light touch- impaired L hand Proprioception- impaired  EDEMA: Has had hx of edema, and came to Eval session with swelling in the L hand/wrist/forearm.   *pt. Has a laceration from his dog on the dorsal aspect of the L hand.   MUSCLE TONE: Increased tone through the UE and hand flexors.   COGNITION: Overall cognitive status: Within functional limits for tasks assessed  VISION: Subjective report: L sided  inattention Baseline vision: Wears glasses for reading only Visual history: Right visual occlusion  VISION ASSESSMENT: To be assessed  PERCEPTION: TBD  PRAXIS: Impaired                                                                                                                 TREATMENT DATE: 11/28/2023 Therapeutic Exercise: -Performed passive L wrist and digit ext stretching intermittently throughout session to decreased stiffness in prep for grasping and releasing ADL supplies from L hand. -Performed closed chain/yellow theraband for L elbow flex/ext, L shoulder flex, abd, ER.  OT providing mod A for positioning to reduce substitution patterns with above noted exercises.  Pt completed 2 sets 10 reps each.    Neuro re-ed: -Focus on LUE FMC/GMC working to move [employer] levels 1-2 of [employer].  Min A on 75% of trials, mod vc on 25% of trials.   (Utilized Estim to promote grasp/release during this activity: Tx time: 10 min, 30 mA CC, duty cycle 50%, ramp 5 sec, cycle time 10/10; electrodes placed at proximal and distal forearm, dorsal aspect, to promote wrist and digit extension)  Self Care: -Fork/knife skills practice; built up foam handles placed overtop utensils in L hand.  Pt practiced cutting theraputty with table knife in R hand, fork to stabilize food in L hand.  Min A to keep fork vertical d/t weak supinators, and assist to stabilize plate despite Dycem beneath plate -Practice trials with use of L hand to grasp fork, stab food (putty pieces), and perform hand to mouth patterns; Min to to reposition fork to angle towards mouth, and to stabilize fork while  stabbing putty.  Frequent assist for repositioning fork in L hand d/t difficult maintaining secure grasp.  Able to maintain grasp for short periods and mod vc for squeezing fork in L hand.   -Encouraged continued attempts with use of foam handles over utensils at home, and attempts at using L hand for larger finger  foods.   Education: Education details: Self feeding strategies Person educated: Patient and spouse Education method: Explanation and Verbal cues, return demo Education comprehension: verbalized understanding and needs further education  HOME EXERCISE PROGRAM: -grasping/releasing tennis ball sized circular sphere, holding while performing wrist extension -Repetitions for grasping/releasing 1 blocks.  GOALS: Goals reviewed with patient? Yes  SHORT TERM GOALS: Target date:.   Pt. Will be independent with HEP for the LUE.  Baseline: 10/29/23: Pt. requires assist from wife with HEPs. Pt. Is consistently working on HEP's at home. 09/19/23: Requires assist from his wife Eval; no current HEP Goal status: Ongoing  LONG TERM GOALS: Target date: 01/21/2024  Pt. Will increase L shoulder flexion by 10 degrees to be able to reach up to shelves.  Baseline: 10/29/23: 888(884) 09/19/23: Pt. Continues to be limited with reaching up to shelves. Eval: L shoulder flexion is 89 (110). Goal status: Ongoing  2.  Pt. Will increase L shoulder abduction by 10 degrees to assist with underarm hygiene.  Baseline:10/29/23: 888(877) 09/19/23: Pt. Continues to require assist with underarm hygiene.Eval: L shoulder abduction is 98 (120).  Goal status: Ongoing  3.  Pt. Will improve L wrist extension by 10 degrees to be able to initiate anticipation of grasping for objects.  Baseline: 10/29/23: 20(55) 09/19/23: Consistent activation noted in the left wrist extensors with facilitation. Eval: -48 (42) Goal status: Ongoing  4.  Pt. Will improve active gross digit extension through 50% of the range to be able to release objects in his hand consistently.  Baseline: 10/28/20: Gross digit extension through 25-50% of the range. 09/19/23: gross digit extension noted through 25% range with facilitation.Eval: gross digit extension is 10% of the range  Goal status: Goal updated to 50% of the range  5.  Pt. Will independently  demonstrate visual compensatory strategies when navigating through environment and during tabletop tasks. Baseline: 10/29/2023: Pt. Continues to less cuing for left sided awareness. 09/19/23: Pt. requires fewer cues for left sided awareness Eval: Pt. Requires consistent cuing for L sided awareness.  Goal status: Ongoing   6. Pt. Will improve left hand [facility] skills to be able to manipulate small objects during ADLs, and IADLs.   Baseline: 10/29/23: 9 Hole Peg Test: Pt. Was able to remove 9 pegs from vertical position on the pegboard in 1 min. & 13 sec. Pt. Is not yet able to pick up pegs from a horizontal position to place them in the pegboard.   Goal status: New   7. Pt. Will improve left hand function skills as evidence by improve score to 4 blocks on the Blocks, and Box test.   Baseline: 10/29/23: Box and Blocks Test: 2 Blocks completed in 1 min.   Goal Status: New  ASSESSMENT: CLINICAL IMPRESSION:  Pt continues to make steady gains with LUE function, and reports that he can see he is improving his accuracy when he reaches with his L hand.  Focus on fork/knife skills today, with pt requiring Min A to keep fork vertical d/t weak supinators when stabilizing food with fork in L hand while R hand cuts with knife, also requiring assist to stabilize plate despite Dycem beneath plate.  Min A to maintain fork grasp while stabbing putty pieces, and frequent repositioning assist d/t fork sliding from hand.  Pt can secure L grip when cued, but then grip loosens with multitasking.  Pt with good tolerance for yellow theraband today for above noted exercises, requiring mod A for positioning to reduce substitution patterns.  OT advised theraband is not yet recommended for HEP until pt is able to perform exercises more independently as to avoid injury.  Pt verbalized understanding.  Pt continues to benefit from OT services to work on improving LUE functioning to increase engagement of the LUE during daily task, and to  provide education about compensatory strategies during ADLs/IADLs.  PERFORMANCE DEFICITS: in functional skills including ADLs, IADLs, coordination, dexterity, proprioception, sensation, edema, tone, ROM, strength, pain, Fine motor control, Gross motor control, mobility, balance, endurance, vision, and UE functional use, cognitive skills including perception, and psychosocial skills including coping strategies, environmental adaptation, and routines and behaviors.   IMPAIRMENTS: are limiting patient from ADLs, IADLs, and leisure.   CO-MORBIDITIES: may have co-morbidities  that affects occupational performance. Patient will benefit from skilled OT to address above impairments and improve overall function.  MODIFICATION OR ASSISTANCE TO COMPLETE EVALUATION: Min-Moderate modification of tasks or assist with assess necessary to complete an evaluation.  OT OCCUPATIONAL PROFILE AND HISTORY: Detailed assessment: Review of records and additional review of physical, cognitive, psychosocial history related to current functional performance.  CLINICAL DECISION MAKING: Moderate - several treatment options, min-mod task modification necessary  REHAB POTENTIAL: Good  EVALUATION COMPLEXITY: Moderate  PLAN:  OT FREQUENCY: 2x/week  OT DURATION: 12 weeks  PLANNED INTERVENTIONS: 97168 OT Re-evaluation, 97535 self care/ADL training, 02889 therapeutic exercise, 97530 therapeutic activity, 97112 neuromuscular re-education, 97140 manual therapy, 97018 paraffin, 02989 moist heat, 97010 cryotherapy, 97034 contrast bath, 97760 Orthotic Initial, 97763 Orthotic/Prosthetic subsequent, passive range of motion, visual/perceptual remediation/compensation, energy conservation, and DME and/or AE instructions  RECOMMENDED OTHER SERVICES: PT and ST  CONSULTED AND AGREED WITH PLAN OF CARE: Patient and family member/caregiver  PLAN FOR NEXT SESSION: see above  Inocente Blazing, MS, OTR/L

## 2023-12-03 NOTE — Therapy (Signed)
 OUTPATIENT PHYSICAL THERAPY TREATMENT  Patient Name: [name]. MRN: [id number] DOB:1950-08-05, 73 y.o., male Today's Date: 12/03/2023 PCP: Dennise Remak, MD  REFERRING PROVIDER:  Pegge Toribio PARAS, PA-C  END OF SESSION:    PT End of Session - 12/03/23 1533     Visit Number 32    Number of Visits 40    Date for PT Re-Evaluation 12/31/23    Authorization Type [employer] Medicare    Authorization Time Period [facility] auth#: [id number] for 6 PT vsts from 8/21-9/18 (9/3 is 5 of 6)    Progress Note Due on Visit 30    PT Start Time 1532    PT Stop Time 1615    PT Time Calculation (min) 43 min    Equipment Utilized During Treatment Gait belt    Activity Tolerance Patient tolerated treatment well;No increased pain    Behavior During Therapy WFL for tasks assessed/performed          Past Medical History:  Diagnosis Date   Anemia    Cervical myelopathy (HCC)    Chronic venous insufficiency    [name] (chronic kidney disease), stage III (HCC)    Diabetes mellitus without complication (HCC)    GERD (gastroesophageal reflux disease)    Hypercholesteremia    Hypothyroidism    Kidney stones    about 50 in the past--last in March 4/24   Leg weakness, bilateral    due to Myasthenia Gravis   Myasthenia gravis (HCC)    Spinal stenosis    Vitamin B 12 deficiency    Vitamin B 12 deficiency    Wears hearing aid    bilateral   Past Surgical History:  Procedure Laterality Date   BACK SURGERY  09/2015   CARDIAC CATHETERIZATION     CATARACT EXTRACTION W/ INTRAOCULAR LENS IMPLANT     CATARACT EXTRACTION W/PHACO Right 12/21/2015   Procedure: CATARACT EXTRACTION PHACO AND INTRAOCULAR LENS PLACEMENT (IOC);  Surgeon: Dene Etienne, MD;  Location: [facility];  Service: Ophthalmology;  Laterality: Right;  DIABETIC - insulin  and oral meds   COLONOSCOPY     SHOULDER SURGERY     labrum repair   TONSILLECTOMY     Patient Active Problem List   Diagnosis Date Noted   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) 10/11/2023   High blood pressure 08/13/2023   Anemia 08/13/2023   Constipation 08/13/2023   Myasthenia gravis (HCC) 08/13/2023   Acute ischemic right MCA stroke (HCC) 07/19/2023   Lymphedema 12/27/2016   Leg pain 07/15/2016   Chronic venous insufficiency 07/15/2016   Swelling of limb 07/15/2016   Type 2 diabetes mellitus (HCC) 07/15/2016   Hyperlipidemia 07/15/2016    ONSET DATE: 07/19/23  REFERRING DIAG:  P36.488 (ICD-10-CM) - Cerebral infarction due to unspecified occlusion or stenosis of right middle cerebral artery   THERAPY DIAG:  Muscle weakness (generalized)  Other lack of coordination  Hemiplegia and hemiparesis following cerebral infarction affecting left non-dominant side (HCC)  Difficulty in walking, not elsewhere classified  Unsteadiness on feet  Abnormality of gait and mobility  Rationale for Evaluation and Treatment: Rehabilitation  SUBJECTIVE:  SUBJECTIVE STATEMENT:   Pt reports no falls and his legs have gotten much better.  Pt notes that he was able to get the fluid off by keeping his feet elevated throughout.   Of note from last session (11/26/23): Patient reported he tried to walk from his recliner, down the distance of the couch (~45ft) and then turn around to go back to his recliner using his RW; however, reported he started to feel like he is losing his balance backwards and so he sat down on the couch for safety, rather than trying to turn around and go back to his recliner. Reports he is trying to perform a right turn when he is turning. Reports he feels something is off with the timing of him turning the AD and stepping his feet.     PERTINENT HISTORY:  CVA with Left hemiplegia in April 2025. Pt  started on a swedish knee cage at CIR due to uncontrolled hyperextension in stance. Pt had not been moving much since discharging from [facility], as therapists instructed pt to perform transfers only at home due to high fall risk with ambulation. Continues to have significant weakenss in the LUE with difficulty extending fingers as well as numbness and inattention to the L side of body resulting in multiple cuts on Arm and leg with WC mobility in home. Will be attaining custom power WC from [employer]. Has loaner currently.   PAIN:  Are you having pain? No  PRECAUTIONS: Fall, history of wounds on LLE   *Latex allergy   WEIGHT BEARING RESTRICTIONS: No  FALLS: Has patient fallen in last 6 months? Yes. Number of falls 1  LIVING ENVIRONMENT: Lives with: lives with their spouse Lives in: House/apartment Stairs: Ramps installed while in the Hospital  Has following equipment at home: Vannie - 2 wheeled and Wheelchair (power)  PLOF: Independent with basic ADLs, Independent with household mobility without device, and Independent with community mobility with device with [facility]   PATIENT GOALS: relearn to Walk 15-38ft.   OBJECTIVE:                                                                                                                              TREATMENT DATE: 12/03/2023  Pt arrives in transport chair.    Therapist assisted in adjusting the knee extension portion of the [facility] moving it back one notch to provide a more secure fit and reduction in hyperextension. Pt noted it to feel tighter, however it felt off.  Pt still proceeded to utilize it and ambulate with it.  Doffed the [facility] at the end of the session.  STS's with use of the bari walker with minA after the first several attempts.  Pt performed first set of 10 at the beginning of the session and another set of 10 at the conclusion of the session.   Pt ambulated in the gym ~44' x2 attempts before taking a seated rest break.  Pt performed this  x3 total attempts throughout the session.  Pt also with a wound on the elbow and therapist assisted in cleaning assistive devices while spouse assisted with bandaging the pt.     Pt with one instance of instability and it came when pt crossed the L LE over the R and was unable to advance the R LE effectively.    PATIENT EDUCATION: Education details: knee cage needs assessmnet as it is causeing crossover gait.  Person educated: Patient and Spouse Education method: Explanation, VC/TC/Demo Education comprehension: verbalized understanding  HOME EXERCISE PROGRAM: Access Code: [email or web address] URL: [email or web address] Date: 09/11/2023 Prepared by: Massie Dollar  Exercises - Seated Knee Extension AROM  - 1 x daily - 4 x weekly - 3 sets - 10 reps - Seated Hip Abduction  - 1 x daily - 4 x weekly - 3 sets - 10 reps - Seated March  - 1 x daily - 4 x weekly - 3 sets - 10 reps - Sit to Stand with Counter Support  - 1 x daily - 4 x weekly - 3 sets - 10 reps - Supine Short Arc Quad  - 1 x daily - 4 x weekly - 3 sets - 10 reps - Supine Hip Abduction  - 1 x daily - 4 x weekly - 3 sets - 10 reps - Supine Bridge  - 1 x daily - 4 x weekly - 3 sets - 3 reps - 2sec  hold  GOALS: Goals reviewed with patient? Yes  SHORT TERM GOALS: Target date: 12/12/2023  Patient will be independent in home exercise program to improve strength/mobility for better functional independence with ADLs. Baseline: Initiated on 09/11/2023 10/22/2023: will update when appropriate 11/26/2023: patient participating in transfer and gait practice in controlled environment with 2 person assistance at home in addition to HEP Goal status: IN PROGRESS  LONG TERM GOALS: Target date: 12/31/2023  Patient will increase SIS-16 score to equal to or greater than 10points to demonstrate statistically significant improvement in mobility and quality of life.  Baseline: 49/80 09/19/2023: 45/80 10/22/2023: 46/80 11/26/2023: 47/80 Goal  status: IN PROGRESS  2.  Patient (> 88 years old) will complete five times sit to stand test in < 15 seconds indicating an increased LE strength and improved balance. Baseline: 1:65min 09/19/2023: 56.39 seconds using R UE support to push-up from armrest of green chair and requiring skilled min A for balance and lifting/lowering  10/22/2023: 27.10 seconds using R UE support to push-up from armrest  8/12: 27.7 sec with RUE pushing from arm rest.  11/26/2023: 37.42 seconds using R UE support to push-up from armrest of green chair and not using L UE support with min-modA for facilitating anterior weight shift and lifting to come to stand secondary to fatigue (would benefit from re-testing at next session) Goal status: IN PROGRESS  3.  Patient will increase FIST score by > 6 points to demonstrate decreased fall risk during functional activities Baseline:  43/56  09/23/2023: 53/56 Goal status: MET and UPGRADED  09/23/2023: Patient will increase Berg Balance score to > 45/56 to demonstrate improved balance and decreased fall risk during functional activities and ADLs.  Baseline: 09/23/2023: 14/56 7/31: 20/56 using RW support 11/26/2023: 17/56 without AD support Goal status: IN PROGRESS  4.  Patient will increase 10 meter walk test to >0.83m/s as to improve gait speed for better community ambulation and to reduce fall risk. Baseline: 09/09/2023: 0.103 m/s ( and 37 seconds) using bari-RW with skilled heavy min A and +2 w/c follow  for safety  09/19/2023: 0.3m/s using bari-RW with skilled min A of 1 and +2 w/c follow for safety 10/22/2023: 0.1875 m/s using bari-RW with L hand splint, L swedish knee cage, and only skilled light min A of 1 (no wheelchair follow)  8/12: 0.176m/s with RW, and L hand splint. No knee cage on this day.  11/26/2023: Average Normal speed: 0.20 m/s & Average Fast speed: 0.275 m/s using bari-RW with CGA/min A for balance wearing SKC Goal status: IN PROGRESS  5.  Patient will reduce  timed up and go to <11 seconds to reduce fall risk and demonstrate improved transfer/gait ability. Baseline: unable to complete turn at 10 ft. Mod-max assist due to poor control of LW on RW, resulting in L lateral LOB  09/09/2023: and 32 seconds using bari-RW with L hand splint and +2 on R side for safety, therapist providing skilled min A on L side (Had pt set-up L hand on RW splint, get L foot in proper positioning, and scoot forward in seat before starting the timer) 09/19/2023: 61min43 seconds using bari-RW with only skilled min A of 1 (Had pt set-up L hand on RW splint, get L foot in proper positioning, and scoot forward in seat before starting the timer) 7/2: 78sec(1:18.125) with RW and min assist overall.  10/22/2023: 1 min, 30 seconds using bari-RW with L hand splint and skilled light min assist  8/12: 1:01 min with RW and L hand splint.  11/26/2023: 51.57 seconds using bari-RW with hand splint, wearing SKC, and CGA-minA for balance Goal status: IN PROGRESS   ASSESSMENT:  CLINICAL IMPRESSION:     Pt performed well and was able to ambulate with the newly adjusted SKC.  Pt did notice a difference and felt that it was different, however he reports that it did not slide down like the last session.  Pt advised to continue to utilize it moving forward until it started to bother him and therapist could reverse it back.  Pt ultimately was able to ambulate greater distances with the therapist and will continue to mobilize utilizing the bari walker and SKC.   Pt will continue to benefit from skilled therapy to address remaining deficits in order to improve overall QoL and return to PLOF.       OBJECTIVE IMPAIRMENTS: Abnormal gait, cardiopulmonary status limiting activity, decreased activity tolerance, decreased balance, decreased cognition, decreased coordination, decreased endurance, decreased knowledge of condition, decreased knowledge of use of DME, decreased mobility, difficulty walking,  decreased ROM, decreased strength, decreased safety awareness, dizziness, hypomobility, increased fascial restrictions, impaired perceived functional ability, increased muscle spasms, impaired sensation, impaired tone, impaired UE functional use, impaired vision/preception, improper body mechanics, postural dysfunction, and obesity.   ACTIVITY LIMITATIONS: carrying, lifting, bending, sitting, standing, squatting, stairs, transfers, bed mobility, bathing, toileting, dressing, reach over head, hygiene/grooming, locomotion level, and caring for others  PARTICIPATION LIMITATIONS: meal prep, cleaning, laundry, medication management, interpersonal relationship, driving, shopping, community activity, and yard work  PERSONAL FACTORS: Age, Past/current experiences, Time since onset of injury/illness/exacerbation, and 3+ comorbidities: MG, HTN, CVA, lymphedema are also affecting patient's functional outcome.   REHAB POTENTIAL: Good  CLINICAL DECISION MAKING: Unstable/unpredictable  EVALUATION COMPLEXITY: High  PLAN:  PT FREQUENCY: 1-2x/week  PT DURATION: 8 weeks  PLANNED INTERVENTIONS: 97164- PT Re-evaluation, 97750- Physical Performance Testing, 97110-Therapeutic exercises, 97530- Therapeutic activity, [id number]- Neuromuscular re-education, 97535- Self Care, [id number]- Manual therapy, [id number]- Gait training, [id number]- Orthotic Initial, [id number]- Orthotic/Prosthetic subsequent, [id number]- Canalith repositioning, H9716- Electrical stimulation (unattended), [id number]- Electrical stimulation (manual),  02402- Wound care (first 20 sq cm), 97598- Wound care (each additional 20 sq cm), Patient/Family education, Balance training, Stair training, Taping, Dry Needling, Joint mobilization, Joint manipulation, Vestibular training, Visual/preceptual remediation/compensation, Cognitive remediation, DME instructions, Wheelchair mobility training, Cryotherapy, and Moist heat  PLAN FOR NEXT SESSION:   Re-test 5xSTS & update LTG Review proper  mechanics/sequencing of transfers  HIGT (high intensity gait training) using litegait harness either on treadmill vs overground Dynamic gait training using bari-RW vs litegait Backwards Side stepping Obstacle navigation (turning) Working on forward propulsion with increased L hip extension and forward progression of pelvis of L stance phase Dynamic standing balance of trunk rotation Re-assess SKC fit & see if posterior knee strap needs to be tightened    Fonda Simpers, PT, DPT Physical Therapist - [facility]  [facility]  12/03/23, 3:34 PM

## 2023-12-05 ENCOUNTER — Ambulatory Visit: Admitting: Occupational Therapy

## 2023-12-05 ENCOUNTER — Encounter: Admitting: Speech Pathology

## 2023-12-05 ENCOUNTER — Ambulatory Visit: Admitting: Physical Therapy

## 2023-12-05 DIAGNOSIS — I69354 Hemiplegia and hemiparesis following cerebral infarction affecting left non-dominant side: Secondary | ICD-10-CM

## 2023-12-05 DIAGNOSIS — R2681 Unsteadiness on feet: Secondary | ICD-10-CM

## 2023-12-05 DIAGNOSIS — M6281 Muscle weakness (generalized): Secondary | ICD-10-CM

## 2023-12-05 DIAGNOSIS — R262 Difficulty in walking, not elsewhere classified: Secondary | ICD-10-CM

## 2023-12-05 DIAGNOSIS — R269 Unspecified abnormalities of gait and mobility: Secondary | ICD-10-CM

## 2023-12-05 NOTE — Therapy (Unsigned)
 OUTPATIENT PHYSICAL THERAPY TREATMENT  Patient Name: [name]. MRN: [id number] DOB:January 02, 1951, 73 y.o., male Today's Date: 12/05/2023 PCP: Dennise Remak, MD  REFERRING PROVIDER:  Pegge Toribio PARAS, PA-C  END OF SESSION:    PT End of Session - 12/05/23 1351     Visit Number 33    Number of Visits 40    Date for PT Re-Evaluation 12/31/23    Authorization Type [employer] Medicare    Authorization Time Period [facility] auth#: [id number] for 6 PT vsts from 8/21-9/18 (9/3 is 5 of 6)    Progress Note Due on Visit 40    PT Start Time 1445    PT Stop Time 1528    PT Time Calculation (min) 43 min    Equipment Utilized During Treatment Gait belt    Activity Tolerance Patient tolerated treatment well;No increased pain    Behavior During Therapy WFL for tasks assessed/performed           Past Medical History:  Diagnosis Date   Anemia    Cervical myelopathy (HCC)    Chronic venous insufficiency    [name] (chronic kidney disease), stage III (HCC)    Diabetes mellitus without complication (HCC)    GERD (gastroesophageal reflux disease)    Hypercholesteremia    Hypothyroidism    Kidney stones    about 50 in the past--last in March 4/24   Leg weakness, bilateral    due to Myasthenia Gravis   Myasthenia gravis (HCC)    Spinal stenosis    Vitamin B 12 deficiency    Vitamin B 12 deficiency    Wears hearing aid    bilateral   Past Surgical History:  Procedure Laterality Date   BACK SURGERY  09/2015   CARDIAC CATHETERIZATION     CATARACT EXTRACTION W/ INTRAOCULAR LENS IMPLANT     CATARACT EXTRACTION W/PHACO Right 12/21/2015   Procedure: CATARACT EXTRACTION PHACO AND INTRAOCULAR LENS PLACEMENT (IOC);  Surgeon: Dene Etienne, MD;  Location: [facility];  Service: Ophthalmology;  Laterality: Right;  DIABETIC - insulin  and oral meds   COLONOSCOPY     SHOULDER SURGERY     labrum repair   TONSILLECTOMY     Patient Active Problem List   Diagnosis Date Noted   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) 10/11/2023   High blood pressure 08/13/2023   Anemia 08/13/2023   Constipation 08/13/2023   Myasthenia gravis (HCC) 08/13/2023   Acute ischemic right MCA stroke (HCC) 07/19/2023   Lymphedema 12/27/2016   Leg pain 07/15/2016   Chronic venous insufficiency 07/15/2016   Swelling of limb 07/15/2016   Type 2 diabetes mellitus (HCC) 07/15/2016   Hyperlipidemia 07/15/2016    ONSET DATE: 07/19/23  REFERRING DIAG:  P36.488 (ICD-10-CM) - Cerebral infarction due to unspecified occlusion or stenosis of right middle cerebral artery   THERAPY DIAG:  Muscle weakness (generalized)  Hemiplegia and hemiparesis following cerebral infarction affecting left non-dominant side (HCC)  Difficulty in walking, not elsewhere classified  Unsteadiness on feet  Abnormality of gait and mobility  Rationale for Evaluation and Treatment: Rehabilitation  SUBJECTIVE:  SUBJECTIVE STATEMENT:   Pt reports doing well today. Pt denies any recent falls/stumbles since prior session. Pt denies any updates to medications or medical appointment since prior session. Pt reports good compliance with HEP when time permits. Pt and wife ambulated twice since last session!   Of note from last session (11/26/23): Patient reported he tried to walk from his recliner, down the distance of the couch (~86ft) and then turn around to go back to his recliner using his RW; however, reported he started to feel like he is losing his balance backwards and so he sat down on the couch for safety, rather than trying to turn around and go back to his recliner. Reports he is trying to perform a right turn when he is turning. Reports he feels something is off with the timing of him turning the AD and  stepping his feet.     PERTINENT HISTORY:  CVA with Left hemiplegia in April 2025. Pt started on a swedish knee cage at CIR due to uncontrolled hyperextension in stance. Pt had not been moving much since discharging from [facility], as therapists instructed pt to perform transfers only at home due to high fall risk with ambulation. Continues to have significant weakenss in the LUE with difficulty extending fingers as well as numbness and inattention to the L side of body resulting in multiple cuts on Arm and leg with WC mobility in home. Will be attaining custom power WC from [employer]. Has loaner currently.   PAIN:  Are you having pain? No  PRECAUTIONS: Fall, history of wounds on LLE   *Latex allergy   WEIGHT BEARING RESTRICTIONS: No  FALLS: Has patient fallen in last 6 months? Yes. Number of falls 1  LIVING ENVIRONMENT: Lives with: lives with their spouse Lives in: House/apartment Stairs: Ramps installed while in the Hospital  Has following equipment at home: Vannie - 2 wheeled and Wheelchair (power)  PLOF: Independent with basic ADLs, Independent with household mobility without device, and Independent with community mobility with device with [facility]   PATIENT GOALS: relearn to Walk 15-22ft.   OBJECTIVE:                                                                                                                              TREATMENT DATE: 12/05/2023  Pt arrives in transport chair.  TA- To improve functional movements patterns for everyday tasks   STS from transport chair with RW and transfers to standard armchair  Gait x 40 ft to mat table with BRW and hand splint   STS x 10 from raised mat table no UE assist x 10 reps   Gait around obstacle and back x 40 ft total with BWR with hand splint  STS x 10 from raised mat table no UE assist x 10 reps   Gait forward and back with  x 40 ft total with BWR with hand splint  STS x 10 from raised mat table no  UE assist x 10 reps   Gait  around multiple obstacles wx 70 ft STS  with B RW and hand splint   Dynamic standing balance with trunk rotation. X 5 min reaching from R to left picking up playing card from PT hand on R and placing in appropriate pile by suit on the left. Challenging left trunk rotation and weight shifting.   PATIENT EDUCATION: Education details: knee cage needs assessmnet as it is causeing crossover gait.  Person educated: Patient and Spouse Education method: Explanation, VC/TC/Demo Education comprehension: verbalized understanding  HOME EXERCISE PROGRAM: Access Code: [email or web address] URL: [email or web address] Date: 09/11/2023 Prepared by: Massie Dollar  Exercises - Seated Knee Extension AROM  - 1 x daily - 4 x weekly - 3 sets - 10 reps - Seated Hip Abduction  - 1 x daily - 4 x weekly - 3 sets - 10 reps - Seated March  - 1 x daily - 4 x weekly - 3 sets - 10 reps - Sit to Stand with Counter Support  - 1 x daily - 4 x weekly - 3 sets - 10 reps - Supine Short Arc Quad  - 1 x daily - 4 x weekly - 3 sets - 10 reps - Supine Hip Abduction  - 1 x daily - 4 x weekly - 3 sets - 10 reps - Supine Bridge  - 1 x daily - 4 x weekly - 3 sets - 3 reps - 2sec  hold  GOALS: Goals reviewed with patient? Yes  SHORT TERM GOALS: Target date: 12/12/2023  Patient will be independent in home exercise program to improve strength/mobility for better functional independence with ADLs. Baseline: Initiated on 09/11/2023 10/22/2023: will update when appropriate 11/26/2023: patient participating in transfer and gait practice in controlled environment with 2 person assistance at home in addition to HEP Goal status: IN PROGRESS  LONG TERM GOALS: Target date: 12/31/2023  Patient will increase SIS-16 score to equal to or greater than 10points to demonstrate statistically significant improvement in mobility and quality of life.  Baseline: 49/80 09/19/2023: 45/80 10/22/2023: 46/80 11/26/2023: 47/80 Goal status: IN PROGRESS  2.   Patient (> 2 years old) will complete five times sit to stand test in < 15 seconds indicating an increased LE strength and improved balance. Baseline: 1:11min 09/19/2023: 56.39 seconds using R UE support to push-up from armrest of green chair and requiring skilled min A for balance and lifting/lowering  10/22/2023: 27.10 seconds using R UE support to push-up from armrest  8/12: 27.7 sec with RUE pushing from arm rest.  11/26/2023: 37.42 seconds using R UE support to push-up from armrest of green chair and not using L UE support with min-modA for facilitating anterior weight shift and lifting to come to stand secondary to fatigue (would benefit from re-testing at next session) Goal status: IN PROGRESS  3.  Patient will increase FIST score by > 6 points to demonstrate decreased fall risk during functional activities Baseline:  43/56  09/23/2023: 53/56 Goal status: MET and UPGRADED  09/23/2023: Patient will increase Berg Balance score to > 45/56 to demonstrate improved balance and decreased fall risk during functional activities and ADLs.  Baseline: 09/23/2023: 14/56 7/31: 20/56 using RW support 11/26/2023: 17/56 without AD support Goal status: IN PROGRESS  4.  Patient will increase 10 meter walk test to >0.47m/s as to improve gait speed for better community ambulation and to reduce fall risk. Baseline: 09/09/2023: 0.103 m/s ( and 37 seconds) using bari-RW with skilled heavy min A  and +2 w/c follow for safety  09/19/2023: 0.36m/s using bari-RW with skilled min A of 1 and +2 w/c follow for safety 10/22/2023: 0.1875 m/s using bari-RW with L hand splint, L swedish knee cage, and only skilled light min A of 1 (no wheelchair follow)  8/12: 0.126m/s with RW, and L hand splint. No knee cage on this day.  11/26/2023: Average Normal speed: 0.20 m/s & Average Fast speed: 0.275 m/s using bari-RW with CGA/min A for balance wearing SKC Goal status: IN PROGRESS  5.  Patient will reduce timed up and go to <11 seconds  to reduce fall risk and demonstrate improved transfer/gait ability. Baseline: unable to complete turn at 10 ft. Mod-max assist due to poor control of LW on RW, resulting in L lateral LOB  09/09/2023: and 32 seconds using bari-RW with L hand splint and +2 on R side for safety, therapist providing skilled min A on L side (Had pt set-up L hand on RW splint, get L foot in proper positioning, and scoot forward in seat before starting the timer) 09/19/2023: 69min43 seconds using bari-RW with only skilled min A of 1 (Had pt set-up L hand on RW splint, get L foot in proper positioning, and scoot forward in seat before starting the timer) 7/2: 78sec(1:18.125) with RW and min assist overall.  10/22/2023: 1 min, 30 seconds using bari-RW with L hand splint and skilled light min assist  8/12: 1:01 min with RW and L hand splint.  11/26/2023: 51.57 seconds using bari-RW with hand splint, wearing SKC, and CGA-minA for balance Goal status: IN PROGRESS   ASSESSMENT:  CLINICAL IMPRESSION:     Patient arrived with good motivation for completion of pt activities.  Pt challenged with functional strength activities and progression of gait abilities. Pt still ambulating with step pattern of moving walker and legs separately; slowing his gait speed. Continue to focus on plan below to improve his function and mobility. Pt will continue to benefit from skilled physical therapy intervention to address impairments, improve QOL, and attain therapy goals.       OBJECTIVE IMPAIRMENTS: Abnormal gait, cardiopulmonary status limiting activity, decreased activity tolerance, decreased balance, decreased cognition, decreased coordination, decreased endurance, decreased knowledge of condition, decreased knowledge of use of DME, decreased mobility, difficulty walking, decreased ROM, decreased strength, decreased safety awareness, dizziness, hypomobility, increased fascial restrictions, impaired perceived functional ability, increased  muscle spasms, impaired sensation, impaired tone, impaired UE functional use, impaired vision/preception, improper body mechanics, postural dysfunction, and obesity.   ACTIVITY LIMITATIONS: carrying, lifting, bending, sitting, standing, squatting, stairs, transfers, bed mobility, bathing, toileting, dressing, reach over head, hygiene/grooming, locomotion level, and caring for others  PARTICIPATION LIMITATIONS: meal prep, cleaning, laundry, medication management, interpersonal relationship, driving, shopping, community activity, and yard work  PERSONAL FACTORS: Age, Past/current experiences, Time since onset of injury/illness/exacerbation, and 3+ comorbidities: MG, HTN, CVA, lymphedema are also affecting patient's functional outcome.   REHAB POTENTIAL: Good  CLINICAL DECISION MAKING: Unstable/unpredictable  EVALUATION COMPLEXITY: High  PLAN:  PT FREQUENCY: 1-2x/week  PT DURATION: 8 weeks  PLANNED INTERVENTIONS: 97164- PT Re-evaluation, 97750- Physical Performance Testing, 97110-Therapeutic exercises, 97530- Therapeutic activity, [id number]- Neuromuscular re-education, 97535- Self Care, [id number]- Manual therapy, [id number]- Gait training, [id number]- Orthotic Initial, [id number]- Orthotic/Prosthetic subsequent, [phone]- Canalith repositioning, H9716- Electrical stimulation (unattended), [phone]- Electrical stimulation (manual), [id number]- Wound care (first 20 sq cm), 97598- Wound care (each additional 20 sq cm), Patient/Family education, Balance training, Stair training, Taping, Dry Needling, Joint mobilization, Joint manipulation, Vestibular training, Visual/preceptual remediation/compensation, Cognitive remediation,  DME instructions, Wheelchair mobility training, Cryotherapy, and Moist heat  PLAN FOR NEXT SESSION:   Re-test 5xSTS & update LTG Review proper mechanics/sequencing of transfers  HIGT (high intensity gait training) using litegait harness either on treadmill vs overground Dynamic gait training using bari-RW vs  litegait Backwards Side stepping Obstacle navigation (turning) Working on forward propulsion with increased L hip extension and forward progression of pelvis of L stance phase Dynamic standing balance of trunk rotation Re-assess SKC fit & see if posterior knee strap needs to be tightened    Note: Portions of this document were prepared using Dragon voice recognition software and although reviewed may contain unintentional dictation errors in syntax, grammar, or spelling.  Lonni KATHEE Gainer PT ,DPT Physical Therapist- [facility]   12/05/23, 1:51 PM

## 2023-12-06 NOTE — Therapy (Signed)
 Occupational Therapy Neuro Treatment Note   Patient Name: [name]. MRN: [id number] DOB:04/26/1950, 73 y.o., male Today's Date: 12/06/2023  PCP: Alm Rower, MD REFERRING PROVIDER: Hermann Toribio PARAS, PA-C  END OF SESSION:  OT End of Session - 12/06/23 0049     Visit Number 31    Number of Visits 48    Date for OT Re-Evaluation 01/21/24    OT Start Time 1400    OT Stop Time 1445    OT Time Calculation (min) 45 min    Activity Tolerance Patient tolerated treatment well    Behavior During Therapy WFL for tasks assessed/performed          Past Medical History:  Diagnosis Date   Anemia    Cervical myelopathy (HCC)    Chronic venous insufficiency    [name] (chronic kidney disease), stage III (HCC)    Diabetes mellitus without complication (HCC)    GERD (gastroesophageal reflux disease)    Hypercholesteremia    Hypothyroidism    Kidney stones    about 50 in the past--last in March 4/24   Leg weakness, bilateral    due to Myasthenia Gravis   Myasthenia gravis (HCC)    Spinal stenosis    Vitamin B 12 deficiency    Vitamin B 12 deficiency    Wears hearing aid    bilateral   Past Surgical History:  Procedure Laterality Date   BACK SURGERY  09/2015   CARDIAC CATHETERIZATION     CATARACT EXTRACTION W/ INTRAOCULAR LENS IMPLANT     CATARACT EXTRACTION W/PHACO Right 12/21/2015   Procedure: CATARACT EXTRACTION PHACO AND INTRAOCULAR LENS PLACEMENT (IOC);  Surgeon: Dene Etienne, MD;  Location: [facility];  Service: Ophthalmology;  Laterality: Right;  DIABETIC - insulin  and oral meds   COLONOSCOPY     SHOULDER SURGERY     labrum repair   TONSILLECTOMY     Patient Active Problem List   Diagnosis Date Noted   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) 10/11/2023   High blood pressure 08/13/2023   Anemia 08/13/2023   Constipation 08/13/2023   Myasthenia gravis (HCC) 08/13/2023   Acute ischemic right MCA  stroke (HCC) 07/19/2023   Lymphedema 12/27/2016   Leg pain 07/15/2016   Chronic venous insufficiency 07/15/2016   Swelling of limb 07/15/2016   Type 2 diabetes mellitus (HCC) 07/15/2016   Hyperlipidemia 07/15/2016   ONSET DATE: 07/13/2023  REFERRING DIAG: Acute ischemic R MCA CVA  THERAPY DIAG: muscle weakness (generalized), other lack of coordination, vision disturbance, hemiplegia and hemiparesis following cerebral infarction affecting left non-dominant side (HCC)  Rationale for Evaluation and Treatment: Rehabilitation  SUBJECTIVE:  SUBJECTIVE STATEMENT: Pt. Wife requested additional information about the home E-Stim unit.  Pt accompanied by: [name]  PERTINENT HISTORY: Pt. Was admitted to [facility] on 07/13/2023 after sustaining a CVA while on a trip to the beach. Pt. Was diagnosed with R MCA CVA. Pt. Was admitted to inpatient rehab from 07/19/2023-08/13/2023. Past medical history includes high BP, anemia, and Myasthenia Gravis.   PRECAUTIONS: None  WEIGHT BEARING RESTRICTIONS: No  PAIN:  12/05/23: No pain reported 12/03/23: 0/10 pain in BLEs today, mild pain in L wrist/volar forearm with end range wrist and digit ext  FALLS: Has patient fallen in last 6 months? Yes  LIVING ENVIRONMENT: Lives with: lives with their family and lives with their spouse-  Lives in: House/apartment- 2 story home and resides mostly on the 1st floor Stairs: No-  Ramped entrance Has following equipment at home:  Vannie, cane, Steadi, shower chair, handheld shower head, bedside commode  PLOF: Independent and Independent with basic ADLs Independent at home   PATIENT GOALS: Would like to walk 10-20 feet each time.   OBJECTIVE:  Note: Objective measures were completed at Evaluation unless otherwise noted.  HAND DOMINANCE: Right  ADLs: Overall ADLs:  Transfers/ambulation related to ADLs: Eating: Independent, 100% R handed Grooming: independent UB Dressing: Can put on shirt independently LB  Dressing: Difficulty with pants and requires assistance getting the pants on his feet, has difficulty putting on shoes and socks Toileting: Tot A for toilet hygiene. Uses Steadi during toilet transfers. Bathing: Requires assist with using the L arm to wash the R arm. Tub Shower transfers: Roll in shower, built in shower chair, grab bars and hand held shower head.  IADLs: Shopping: Total A (Wife typically did the shopping prior to onset)   Light housekeeping: Total A (wife typically performs prior to onset) Meal Prep: Independent with light snack prep Community mobility: No driving, able to get in/out of the car Medication management: Set up assistance from his wife using a pill box (pt. Is responsible for taking medications at the correct time) Financial management: family members check behind him. Handwriting: TBD Hobbies: gardening, wood working and sports- [employer] Work history: Owns a tax business  MOBILITY STATUS: Needs Assist:    POSTURE COMMENTS:   Sitting balance: Good supported sitting balance  FUNCTIONAL OUTCOME MEASURES: TBD  UPPER EXTREMITY ROM:    Active ROM Right Eval  [facility] Right 09/23/2023 [facility] Left eval Left 09/23/2023 Left 10/29/23 Left 12/02/23  Shoulder flexion   89(110) 109(114) 111(115) 90 before scaption (110)  Shoulder abduction   98(120) 109(120) 111(122) 100 (110)  Shoulder adduction        Shoulder extension        Shoulder internal rotation        Shoulder external rotation        Elbow flexion   120(140) 130(136) 141(145)   Elbow extension   -28(-10) -5(0) -18(0)   Wrist flexion   60(60) Rest at 60 50(65) 54(60)   Wrist extension   -48(42) 10(40) 20(50) 30 (45)  Wrist ulnar deviation     12(22)   Wrist radial deviation     10(20)   Wrist pronation     (82)90   Wrist supination     64(74) 70 (75)  (Blank rows = not tested)  Eval: L Digit flexion: 2nd:3cm (0cm) 3rd: 0cm (0cm), 4th: 0cm (0cm), 5th: 2cm (0cm)  09/23/23:  L Digit flexion to  [facility]: 2nd: 3.5cm (0cm), 3rd: 4 cm(0cm), 4th 3.5cm (0cm), 5th: 2cm (0cm)  Eval:  L Digit extension: Active Gross digit extension through 10% of ROM  09/23/23:  L Digit extension: Active Gross digit extension through 50% of ROM  10/29/23:  L Digit extension: Active Gross digit extension through 25-50% of ROM  12/02/23:  L digit extension  Active gross ext through 30%   UPPER EXTREMITY MMT:     MMT Right eval Left eval Left 09/23/2023 Left 10/29/23 Left 12/02/23  Shoulder flexion 5 3- 3-/5 3-/5   Shoulder abduction 5 3- 3-/5 3-/5   Shoulder adduction       Shoulder extension       Shoulder internal rotation       Shoulder external rotation       Middle trapezius       Lower trapezius  Elbow flexion 5 TBD 3+/5 4-/5 4+/5  Elbow extension 5 TBD 4-/5 3-/5 4+/5  Wrist flexion   2/5 3-/5 3-  Wrist extension  2- 2/5 2+/5 3-  Wrist ulnar deviation       Wrist radial deviation       Wrist pronation    3/5 3+  Wrist supination    3-/5 3-  (Blank rows = not tested)  HAND FUNCTION:  Eval:  Grip strength: Right: 94#, Left: 1#,  Lateral Key Pinch strength: Right: 14#, Left: 3#  10/29/23:  Grip strength: Left: 16# Lateral Key Pinch strength: Left: 5#  12/02/23: L: 5 lbs, R: 98 # Lateral pinch: Left: 5 lbs   COORDINATION: Box and Blocks: Right: 34 blocks completed in 1 min., Left: 0 blocks completed in 1 min.   10/29/23:  Box and Blocks: Right: 34 blocks completed in 1 min., Left: 2 blocks completed in 1 min.  9 Hole Peg Test: Left: To remove 9 pegs from a vertical position: 1 min. &13 sec.  12/02/23: Box and Blocks: 4 blocks 12/02/23: 4 pegs out in 3 min (multiple pulled and dropped out of the cup)   SENSATION: Sensation feels different in both hands.  L hand Light touch- impaired L hand Proprioception- impaired  EDEMA: Has had hx of edema, and came to Eval session with swelling in the L hand/wrist/forearm.   *pt. has a laceration from his dog on the dorsal aspect of  the L hand.   MUSCLE TONE: Increased tone through the UE and hand flexors.   COGNITION: Overall cognitive status: Within functional limits for tasks assessed  VISION: Subjective report: L sided inattention Baseline vision: Wears glasses for reading only Visual history: Right visual occlusion  VISION ASSESSMENT: To be assessed  PERCEPTION: TBD  PRAXIS: Impaired                                                                                                                 TREATMENT DATE: 12/05/2023  Therapeutic Ex.:   -Pt. tolerated ROM to the LUE UE through all joint ranges of the LUE with emphasis placed on left wrist,a nd digit flexion, extension, MP, PIP, and DIP extension.  Therapeutic Activity:  -Facilitated diagonal patterns of movement with the LUE in preparation for self-grooming tasks. -Facilitated left hand function skills promoting digit extension grasping cards using fingers on thumb technique for multiple reps while holding the deck of cards in the right hand. -Pt. Focused on adding reps of left forearm supination when  flipping, and placing the cards on the table. -Facilitated left hand function skills opening a container lid with the left hand, and pouring the contents out onto a table with the left hand. Pt. Then worked on grasping 1/2 cubes with the left hand, then attempting to stack them vertically on top of each other. The task was modified from 1/2 to 1 cubes.  Pt. Used the right hand to stabilize the left hand when attempting to stack the cubes.    Education: Education  details: LUE functioning, Home EStim unit Person educated: Patient and spouse Education method: Explanation and Verbal cues Education comprehension: verbalized understanding  HOME EXERCISE PROGRAM:  -grasping and stacking 1, and 1/2 cubes form the tabletop surface.  GOALS: Goals reviewed with patient? Yes  SHORT TERM GOALS: Target date: 10/01/23  Pt. Will be independent with HEP  for the LUE.  Baseline: 12/03/23: indep with current HEP, though ongoing progressions are being made as UE function improves; 10/29/23: Pt. requires assist from wife with HEPs. Pt. Is consistently working on HEP's at home. 09/19/23: Requires assist from his wife Eval; no current HEP Goal status: Ongoing  LONG TERM GOALS: Target date: 01/21/2024  Pt. Will increase L shoulder flexion by 10 degrees to be able to reach up to shelves.  Baseline: 12/03/23: 90* before onset of scaption; 10/29/23: 111(115) 09/19/23: Pt. Continues to be limited with reaching up to shelves. Eval: L shoulder flexion is 89 (110). Goal status: Ongoing  2.  Pt. Will increase L shoulder abduction by 10 degrees to assist with underarm hygiene.  Baseline: 12/03/23: 100*; pt able to demo bilat underarm hygiene with set up; 10/29/23: [phone]) 09/19/23: Pt. Continues to require assist with underarm hygiene.Eval: L shoulder abduction is 98 (120).  Goal status: ongoing  3.  Pt. Will improve L wrist extension by 10 degrees to be able to initiate anticipation of grasping for objects.  Baseline: 12/03/23: 30* (45); 10/29/23: 20(55) 09/19/23: Consistent activation noted in the left wrist extensors with facilitation. Eval: -48 (42) Goal status: Ongoing  4.  Pt. Will improve active gross digit extension through 50% of the range to be able to release objects in his hand consistently.  Baseline: 12/03/23: Gross digit ext through ~30% of range (limited by flexor tone in the PIPs); 10/28/20: Gross digit extension through 25-50% of the range. 09/19/23: gross digit extension noted through 25% range with facilitation.Eval: gross digit extension is 10% of the range  Goal status: Goal updated to 50% of the range  5.  Pt. Will independently demonstrate visual compensatory strategies when navigating through environment and during tabletop tasks. Baseline: 12/03/23: Pt demonstrates consistent head turns to scan to L visual field without cueing; 10/29/2023: Pt. Continues to  less cuing for left sided awareness. 09/19/23: Pt. requires fewer cues for left sided awareness Eval: Pt. Requires consistent cuing for L sided awareness.  Goal status: achieved   6. Pt. Will improve left hand [facility] skills to be able to manipulate small objects during ADLs, and IADLs. Baseline: 12/03/23: Pt removed 3 pegs in >3 min, pulling out more than 3 but repeatedly dropping to floor or table top rather than assessment dish; 10/29/23: 9 Hole Peg Test: Pt. Was able to remove 9 pegs from vertical position on the pegboard in 1 min. & 13 sec. Pt. Is not yet able to pick up pegs from a horizontal position to place them in the pegboard.   Goal status: ongoing   7. Pt will improve left hand function skills as evidenced by improved score to 4 blocks on the Box and Blocks test. Baseline: 12/03/23: 4 blocks after 3 trials and passive wrist and digit stretching between trials (not yet consistent to achieve 4 blocks); 10/29/23: Box and Blocks Test: 2 Blocks completed in 1 min.   Goal Status: ongoing   ASSESSMENT: CLINICAL IMPRESSION:   Pt./caregiver report that he is unable to receive Botox due to his Myasthenia gravis diagnosis. Pt. requires cues, and assist through diagonal patterns with the LUE. Pt. is able  to reach further with the LUE. Pt. presents with intermittent mild discomfort with left 4th digit extension. Pt. was able to grasp the 1/2 cubes, however had difficulty stacking them, and required the task to be modified for 1 cubes. Pt. reports that he has multiple sizes of cubes to practice with at home that are not as slick. Pt. was able to use the left hand to pour the cubes out onto the table. Pt continues to benefit from OT services to work on improving LUE functioning to increase engagement of the LUE during daily task, and to provide education about compensatory strategies during ADLs/IADLs.  PERFORMANCE DEFICITS: in functional skills including ADLs, IADLs, coordination, dexterity, proprioception,  sensation, edema, tone, ROM, strength, pain, Fine motor control, Gross motor control, mobility, balance, endurance, vision, and UE functional use, cognitive skills including perception, and psychosocial skills including coping strategies, environmental adaptation, and routines and behaviors.   IMPAIRMENTS: are limiting patient from ADLs, IADLs, and leisure.   CO-MORBIDITIES: may have co-morbidities  that affects occupational performance. Patient will benefit from skilled OT to address above impairments and improve overall function.  MODIFICATION OR ASSISTANCE TO COMPLETE EVALUATION: Min-Moderate modification of tasks or assist with assess necessary to complete an evaluation.  OT OCCUPATIONAL PROFILE AND HISTORY: Detailed assessment: Review of records and additional review of physical, cognitive, psychosocial history related to current functional performance.  CLINICAL DECISION MAKING: Moderate - several treatment options, min-mod task modification necessary  REHAB POTENTIAL: Good  EVALUATION COMPLEXITY: Moderate  PLAN:  OT FREQUENCY: 2x/week  OT DURATION: 12 weeks  PLANNED INTERVENTIONS: 97168 OT Re-evaluation, 97535 self care/ADL training, 02889 therapeutic exercise, 97530 therapeutic activity, 97112 neuromuscular re-education, 97140 manual therapy, 97018 paraffin, 02989 moist heat, 97010 cryotherapy, 97034 contrast bath, 97760 Orthotic Initial, 97763 Orthotic/Prosthetic subsequent, passive range of motion, visual/perceptual remediation/compensation, energy conservation, and DME and/or AE instructions  RECOMMENDED OTHER SERVICES: PT and ST  CONSULTED AND AGREED WITH PLAN OF CARE: Patient and family member/caregiver  PLAN FOR NEXT SESSION: see above  Richardson Otter, MS, OTR/L   12/06/23

## 2023-12-10 ENCOUNTER — Ambulatory Visit: Admitting: Occupational Therapy

## 2023-12-10 ENCOUNTER — Ambulatory Visit: Admitting: Physical Therapy

## 2023-12-10 ENCOUNTER — Encounter: Admitting: Speech Pathology

## 2023-12-10 DIAGNOSIS — R262 Difficulty in walking, not elsewhere classified: Secondary | ICD-10-CM

## 2023-12-10 DIAGNOSIS — I69354 Hemiplegia and hemiparesis following cerebral infarction affecting left non-dominant side: Secondary | ICD-10-CM

## 2023-12-10 DIAGNOSIS — R2681 Unsteadiness on feet: Secondary | ICD-10-CM

## 2023-12-10 DIAGNOSIS — R278 Other lack of coordination: Secondary | ICD-10-CM

## 2023-12-10 DIAGNOSIS — R269 Unspecified abnormalities of gait and mobility: Secondary | ICD-10-CM

## 2023-12-10 DIAGNOSIS — M6281 Muscle weakness (generalized): Secondary | ICD-10-CM | POA: Diagnosis not present

## 2023-12-10 NOTE — Therapy (Signed)
 OUTPATIENT PHYSICAL THERAPY TREATMENT  Patient Name: [name]. MRN: [id number] DOB:March 25, 1951, 73 y.o., male Today's Date: 12/10/2023 PCP: Dennise Remak, MD  REFERRING PROVIDER:  Pegge Toribio PARAS, PA-C  END OF SESSION:    PT End of Session - 12/10/23 1518     Visit Number 34    Number of Visits 40    Date for PT Re-Evaluation 12/31/23    Authorization Type [employer] Medicare    Authorization Time Period [facility] auth#: [id number] for 6 PT vst from 9/9-10/07 (9/16 is 3 of 6)    Progress Note Due on Visit 40    PT Start Time 1530    PT Stop Time 1616    PT Time Calculation (min) 46 min    Equipment Utilized During Treatment Gait belt    Activity Tolerance Patient tolerated treatment well;No increased pain    Behavior During Therapy WFL for tasks assessed/performed            Past Medical History:  Diagnosis Date   Anemia    Cervical myelopathy (HCC)    Chronic venous insufficiency    [name] (chronic kidney disease), stage III (HCC)    Diabetes mellitus without complication (HCC)    GERD (gastroesophageal reflux disease)    Hypercholesteremia    Hypothyroidism    Kidney stones    about 50 in the past--last in March 4/24   Leg weakness, bilateral    due to Myasthenia Gravis   Myasthenia gravis (HCC)    Spinal stenosis    Vitamin B 12 deficiency    Vitamin B 12 deficiency    Wears hearing aid    bilateral   Past Surgical History:  Procedure Laterality Date   BACK SURGERY  09/2015   CARDIAC CATHETERIZATION     CATARACT EXTRACTION W/ INTRAOCULAR LENS IMPLANT     CATARACT EXTRACTION W/PHACO Right 12/21/2015   Procedure: CATARACT EXTRACTION PHACO AND INTRAOCULAR LENS PLACEMENT (IOC);  Surgeon: Dene Etienne, MD;  Location: [facility];  Service: Ophthalmology;  Laterality: Right;  DIABETIC - insulin  and oral meds   COLONOSCOPY     SHOULDER SURGERY     labrum repair   TONSILLECTOMY     Patient Active Problem List   Diagnosis Date Noted    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) 10/11/2023   High blood pressure 08/13/2023   Anemia 08/13/2023   Constipation 08/13/2023   Myasthenia gravis (HCC) 08/13/2023   Acute ischemic right MCA stroke (HCC) 07/19/2023   Lymphedema 12/27/2016   Leg pain 07/15/2016   Chronic venous insufficiency 07/15/2016   Swelling of limb 07/15/2016   Type 2 diabetes mellitus (HCC) 07/15/2016   Hyperlipidemia 07/15/2016    ONSET DATE: 07/19/23  REFERRING DIAG:  P36.488 (ICD-10-CM) - Cerebral infarction due to unspecified occlusion or stenosis of right middle cerebral artery   THERAPY DIAG:  Hemiplegia and hemiparesis following cerebral infarction affecting left non-dominant side (HCC)  Muscle weakness (generalized)  Difficulty in walking, not elsewhere classified  Unsteadiness on feet  Abnormality of gait and mobility  Other lack of coordination  Rationale for Evaluation and Treatment: Rehabilitation  SUBJECTIVE:  SUBJECTIVE STATEMENT:   Pt states since the last time he saw this therapist he has walked ~150steps at home with either the assistance of his children or wife using a bari-RW. Wife reports she feels safe assisting patient with this. Pt states his next goal is to ambulate up/down his handicap ramp at home, using the support of the handrails on his ramp. Pt and wife report they haven't changed the fit of pt's SKC since it was adjusted by therapist on 9/9 with pt states he is getting used to the new fit. No falls reported and no other updates reported.    PERTINENT HISTORY:  CVA with Left hemiplegia in April 2025. Pt started on a swedish knee cage at [facility] due to uncontrolled hyperextension in stance. Pt had not been moving much since discharging from [facility], as  therapists instructed pt to perform transfers only at home due to high fall risk with ambulation. Continues to have significant weakenss in the LUE with difficulty extending fingers as well as numbness and inattention to the L side of body resulting in multiple cuts on Arm and leg with WC mobility in home. Will be attaining custom power WC from [employer]. Has loaner currently.   PAIN:  Are you having pain? No  PRECAUTIONS: Fall, history of wounds on LLE   *Latex allergy   WEIGHT BEARING RESTRICTIONS: No  FALLS: Has patient fallen in last 6 months? Yes. Number of falls 1  LIVING ENVIRONMENT: Lives with: lives with their spouse Lives in: House/apartment Stairs: Ramps installed while in the Hospital  Has following equipment at home: Vannie - 2 wheeled and Wheelchair (power)  PLOF: Independent with basic ADLs, Independent with household mobility without device, and Independent with community mobility with device with [facility]   PATIENT GOALS: relearn to Walk 15-50ft.   OBJECTIVE:                                                                                                                              TREATMENT DATE: 12/10/2023  Pt arrives in transport chair.   Donned pt's L LE SKC to use during session - noticed much tighter fit around his knee with adjustments, which more appropriately blocks knee hyperextension, but does cause pt to stay in slight knee flexed positioning.  Pt able to safely ambulate to litegait harness using bari-RW with L hand splint and skilled CGA for safety. Pt able to step on/off treadmill in litegait harness for balance support, using B UE support on handrails. Therapist providing manual facilitation for L LE stepping as needed.  Gait training the following trials on the treadmill in litegait harness providing balance support, but not true BWS: Donned 3lb AW on L LE for gait trials today 1st trial: 3 min and 44seconds at 0.21mph to 1.2 mph totaling 375ft (most  consistently at 1.57mph)  HR 138 bpm  1x needed to stop briefly and recovery LOB due to L toe catching during swing and  pt unable to recover balance quickly enough Decreased L LE foot clearance during swing requiring consistent skilled min manual facilitation to improve Therapist providing min manual facilitation for increased L LE step length  2nd trial: 1 min 45 sec 0.8 > 1.0 totalling 138 ft (most consistently at 1.23mph)  HR 141 bpm immediately after HR improves to 109bpm during seated rest break Pt continues to have decreased L LE foot clearance during swing and decreased step length, especially with fatigue, but no excessive adduction noted today Pt achieves reciprocal stepping pattern >90% throughout with min manual facilitation  Gait training ~33ft overground in litegait harness providing min BWS, using +2 R HHA to provide patient with confidence to attempt ambulating without UE support Pt with decreased L LE foot clearance, catching L toes ~5x during swing needing to stop and regain balance with R UE support on litegait handle Patient was very nervous during gait training without UE support, but this was a great challenge to focus on his ability to perform proper wt shifting onto stance limbs without reliance on R UE support to facilitate it  HR 114-112bpm immediately following final gait training  Pt reports he really enjoys the challenge of gait training on the treadmill and feels it works him the hardest of anything   PATIENT EDUCATION: Education details: knee cage needs assessmnet as it is causeing crossover gait.  Person educated: Patient and Spouse Education method: Explanation, VC/TC/Demo Education comprehension: verbalized understanding  HOME EXERCISE PROGRAM: Access Code: [email or web address] URL: [email or web address] Date: 09/11/2023 Prepared by: Massie Dollar  Exercises - Seated Knee Extension AROM  - 1 x daily - 4 x weekly - 3 sets - 10 reps - Seated Hip  Abduction  - 1 x daily - 4 x weekly - 3 sets - 10 reps - Seated March  - 1 x daily - 4 x weekly - 3 sets - 10 reps - Sit to Stand with Counter Support  - 1 x daily - 4 x weekly - 3 sets - 10 reps - Supine Short Arc Quad  - 1 x daily - 4 x weekly - 3 sets - 10 reps - Supine Hip Abduction  - 1 x daily - 4 x weekly - 3 sets - 10 reps - Supine Bridge  - 1 x daily - 4 x weekly - 3 sets - 3 reps - 2sec  hold  GOALS: Goals reviewed with patient? Yes  SHORT TERM GOALS: Target date: 12/12/2023  Patient will be independent in home exercise program to improve strength/mobility for better functional independence with ADLs. Baseline: Initiated on 09/11/2023 10/22/2023: will update when appropriate 11/26/2023: patient participating in transfer and gait practice in controlled environment with 2 person assistance at home in addition to HEP Goal status: IN PROGRESS  LONG TERM GOALS: Target date: 12/31/2023  Patient will increase SIS-16 score to equal to or greater than 10points to demonstrate statistically significant improvement in mobility and quality of life.  Baseline: 49/80 09/19/2023: 45/80 10/22/2023: 46/80 11/26/2023: 47/80 Goal status: IN PROGRESS  2.  Patient (> 18 years old) will complete five times sit to stand test in < 15 seconds indicating an increased LE strength and improved balance. Baseline: 1:39min 09/19/2023: 56.39 seconds using R UE support to push-up from armrest of green chair and requiring skilled min A for balance and lifting/lowering  10/22/2023: 27.10 seconds using R UE support to push-up from armrest  8/12: 27.7 sec with RUE pushing from arm rest.  11/26/2023: 37.42 seconds using R UE  support to push-up from armrest of green chair and not using L UE support with min-modA for facilitating anterior weight shift and lifting to come to stand secondary to fatigue (would benefit from re-testing at next session) Goal status: IN PROGRESS  3.  Patient will increase FIST score by > 6 points to  demonstrate decreased fall risk during functional activities Baseline:  43/56  09/23/2023: 53/56 Goal status: MET and UPGRADED  09/23/2023: Patient will increase Berg Balance score to > 45/56 to demonstrate improved balance and decreased fall risk during functional activities and ADLs.  Baseline: 09/23/2023: 14/56 7/31: 20/56 using RW support 11/26/2023: 17/56 without AD support Goal status: IN PROGRESS  4.  Patient will increase 10 meter walk test to >0.33m/s as to improve gait speed for better community ambulation and to reduce fall risk. Baseline: 09/09/2023: 0.103 m/s ( and 37 seconds) using bari-RW with skilled heavy min A and +2 w/c follow for safety  09/19/2023: 0.22m/s using bari-RW with skilled min A of 1 and +2 w/c follow for safety 10/22/2023: 0.1875 m/s using bari-RW with L hand splint, L swedish knee cage, and only skilled light min A of 1 (no wheelchair follow)  8/12: 0.124m/s with RW, and L hand splint. No knee cage on this day.  11/26/2023: Average Normal speed: 0.20 m/s & Average Fast speed: 0.275 m/s using bari-RW with CGA/min A for balance wearing SKC Goal status: IN PROGRESS  5.  Patient will reduce timed up and go to <11 seconds to reduce fall risk and demonstrate improved transfer/gait ability. Baseline: unable to complete turn at 10 ft. Mod-max assist due to poor control of LW on RW, resulting in L lateral LOB  09/09/2023: and 32 seconds using bari-RW with L hand splint and +2 on R side for safety, therapist providing skilled min A on L side (Had pt set-up L hand on RW splint, get L foot in proper positioning, and scoot forward in seat before starting the timer) 09/19/2023: 95min43 seconds using bari-RW with only skilled min A of 1 (Had pt set-up L hand on RW splint, get L foot in proper positioning, and scoot forward in seat before starting the timer) 7/2: 78sec(1:18.125) with RW and min assist overall.  10/22/2023: 1 min, 30 seconds using bari-RW with L hand splint and  skilled light min assist  8/12: 1:01 min with RW and L hand splint.  11/26/2023: 51.57 seconds using bari-RW with hand splint, wearing SKC, and CGA-minA for balance Goal status: IN PROGRESS   ASSESSMENT:  CLINICAL IMPRESSION:    Patient arrived motivated to participate in therapy session. Patient reports really enjoying working on treadmill gait training as it challenges him more than anything. Therapy session continued to focus on progression of high intensity gait training (HIGT) with pt able to progress to 1. max speed today and consistently ambulate at 1.0-1.44mph. Therapist added 3lb AW to L LE during gait training today; however, this was significantly challenging requiring consistent min manual facilitation to ensure clearing foot sufficiently to achieve positive step length. Patient agreeable to progression of overground gait training in litegait harness without UE support and this was really challenging as pt felt very unsure of himself and his balance, but would benefit from continuation of this challenge to improve pt's motor plan of wt shifting onto stance limbs without compensation through R UE to achieve this. Pt will continue to benefit from skilled physical therapy intervention to address impairments, improve QOL, and attain therapy goals.  OBJECTIVE IMPAIRMENTS: Abnormal gait, cardiopulmonary status limiting activity, decreased activity tolerance, decreased balance, decreased cognition, decreased coordination, decreased endurance, decreased knowledge of condition, decreased knowledge of use of DME, decreased mobility, difficulty walking, decreased ROM, decreased strength, decreased safety awareness, dizziness, hypomobility, increased fascial restrictions, impaired perceived functional ability, increased muscle spasms, impaired sensation, impaired tone, impaired UE functional use, impaired vision/preception, improper body mechanics, postural dysfunction, and obesity.   ACTIVITY  LIMITATIONS: carrying, lifting, bending, sitting, standing, squatting, stairs, transfers, bed mobility, bathing, toileting, dressing, reach over head, hygiene/grooming, locomotion level, and caring for others  PARTICIPATION LIMITATIONS: meal prep, cleaning, laundry, medication management, interpersonal relationship, driving, shopping, community activity, and yard work  PERSONAL FACTORS: Age, Past/current experiences, Time since onset of injury/illness/exacerbation, and 3+ comorbidities: MG, HTN, CVA, lymphedema are also affecting patient's functional outcome.   REHAB POTENTIAL: Good  CLINICAL DECISION MAKING: Unstable/unpredictable  EVALUATION COMPLEXITY: High  PLAN:  PT FREQUENCY: 1-2x/week  PT DURATION: 8 weeks  PLANNED INTERVENTIONS: 97164- PT Re-evaluation, 97750- Physical Performance Testing, 97110-Therapeutic exercises, 97530- Therapeutic activity, [id number]- Neuromuscular re-education, 97535- Self Care, [id number]- Manual therapy, [id number]- Gait training, [phone]- Orthotic Initial, [phone]- Orthotic/Prosthetic subsequent, [phone]- Canalith repositioning, [id number]- Electrical stimulation (unattended), [phone]- Electrical stimulation (manual), [id number]- Wound care (first 20 sq cm), 97598- Wound care (each additional 20 sq cm), Patient/Family education, Balance training, Stair training, Taping, Dry Needling, Joint mobilization, Joint manipulation, Vestibular training, Visual/preceptual remediation/compensation, Cognitive remediation, DME instructions, Wheelchair mobility training, Cryotherapy, and Moist heat  PLAN FOR NEXT SESSION:   Re-test 5xSTS & update LTG Review proper mechanics/sequencing of transfers  HIGT (high intensity gait training) using litegait harness either on treadmill vs overground Dynamic gait training using bari-RW vs litegait Backwards Side stepping Obstacle navigation (turning) Working on forward propulsion with increased L hip extension and forward progression of pelvis of L stance  phase Dynamic standing balance of trunk rotation Re-assess SKC fit & see if posterior knee strap needs to be tightened   Connell Kiss, PT, DPT, NCS, CSRS Physical Therapist -   [facility]  4:36 PM 12/10/23

## 2023-12-11 NOTE — Therapy (Addendum)
 Occupational Therapy Neuro Treatment Note   Patient Name: [name]. MRN: [id number] DOB:[birth year], 73 y.o., male Today's Date: 12/11/2023  PCP: Alm Rower, MD REFERRING PROVIDER: Hermann Toribio PARAS, PA-C  END OF SESSION:  OT End of Session - 12/11/23 0926     Visit Number 32    Number of Visits 48    Date for OT Re-Evaluation 01/21/24    OT Start Time 1445    OT Stop Time 1530    OT Time Calculation (min) 45 min    Behavior During Therapy [facility] for tasks assessed/performed          Past Medical History:  Diagnosis Date   Anemia    Cervical myelopathy (HCC)    Chronic venous insufficiency    [name] (chronic kidney disease), stage III (HCC)    Diabetes mellitus without complication (HCC)    GERD (gastroesophageal reflux disease)    Hypercholesteremia    Hypothyroidism    Kidney stones    about 50 in the past--last in March 4/24   Leg weakness, bilateral    due to Myasthenia Gravis   Myasthenia gravis (HCC)    Spinal stenosis    Vitamin B 12 deficiency    Vitamin B 12 deficiency    Wears hearing aid    bilateral   Past Surgical History:  Procedure Laterality Date   BACK SURGERY  09/2015   CARDIAC CATHETERIZATION     CATARACT EXTRACTION W/ INTRAOCULAR LENS IMPLANT     CATARACT EXTRACTION W/PHACO Right 12/21/2015   Procedure: CATARACT EXTRACTION PHACO AND INTRAOCULAR LENS PLACEMENT (IOC);  Surgeon: Dene Etienne, MD;  Location: [facility];  Service: Ophthalmology;  Laterality: Right;  DIABETIC - insulin  and oral meds   COLONOSCOPY     SHOULDER SURGERY     labrum repair   TONSILLECTOMY     Patient Active Problem List   Diagnosis Date Noted   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) 10/11/2023   High blood pressure 08/13/2023   Anemia 08/13/2023   Constipation 08/13/2023   Myasthenia gravis (HCC) 08/13/2023   Acute ischemic right MCA stroke (HCC) 07/19/2023   Lymphedema 12/27/2016   Leg pain  07/15/2016   Chronic venous insufficiency 07/15/2016   Swelling of limb 07/15/2016   Type 2 diabetes mellitus (HCC) 07/15/2016   Hyperlipidemia 07/15/2016   ONSET DATE: 07/13/2023  REFERRING DIAG: Acute ischemic R MCA CVA  THERAPY DIAG: muscle weakness (generalized), other lack of coordination, vision disturbance, hemiplegia and hemiparesis following cerebral infarction affecting left non-dominant side (HCC)  Rationale for Evaluation and Treatment: Rehabilitation  SUBJECTIVE:  SUBJECTIVE STATEMENT: Pt.  reports that he is planning to go and watch hi granddaughter play golf this week.  Pt accompanied by: [name]  PERTINENT HISTORY: Pt. Was admitted to [facility] on 07/13/2023 after sustaining a CVA while on a trip to the beach. Pt. Was diagnosed with R MCA CVA. Pt. Was admitted to inpatient rehab from 07/19/2023-08/13/2023. Past medical history includes high BP, anemia, and Myasthenia Gravis.   PRECAUTIONS: None  WEIGHT BEARING RESTRICTIONS: No  PAIN:  12/11/23: No pain 12/05/23: No pain reported 12/03/23: 0/10 pain in BLEs today, mild pain in L wrist/volar forearm with end range wrist and digit ext  FALLS: Has patient fallen in last 6 months? Yes  LIVING ENVIRONMENT: Lives with: lives with their family and lives with their spouse-  Lives in: House/apartment- 2 story home and resides mostly on the 1st floor Stairs:  No- Ramped entrance Has following equipment at home:  Vannie, cane, Steadi, shower chair, handheld shower head, bedside commode  PLOF: Independent and Independent with basic ADLs Independent at home   PATIENT GOALS: Would like to walk 10-20 feet each time.   OBJECTIVE:  Note: Objective measures were completed at Evaluation unless otherwise noted.  HAND DOMINANCE: Right  ADLs: Overall ADLs:  Transfers/ambulation related to ADLs: Eating: Independent, 100% R handed Grooming: independent UB Dressing: Can put on shirt independently LB Dressing: Difficulty with  pants and requires assistance getting the pants on his feet, has difficulty putting on shoes and socks Toileting: Tot A for toilet hygiene. Uses Steadi during toilet transfers. Bathing: Requires assist with using the L arm to wash the R arm. Tub Shower transfers: Roll in shower, built in shower chair, grab bars and hand held shower head.  IADLs: Shopping: Total A (Wife typically did the shopping prior to onset)   Light housekeeping: Total A (wife typically performs prior to onset) Meal Prep: Independent with light snack prep Community mobility: No driving, able to get in/out of the car Medication management: Set up assistance from his wife using a pill box (pt. Is responsible for taking medications at the correct time) Financial management: family members check behind him. Handwriting: TBD Hobbies: gardening, wood working and sports- [employer] Work history: Owns a tax business  MOBILITY STATUS: Needs Assist:    POSTURE COMMENTS:   Sitting balance: Good supported sitting balance  FUNCTIONAL OUTCOME MEASURES: TBD  UPPER EXTREMITY ROM:    Active ROM Right Eval  [facility] Right 09/23/2023 [facility] Left eval Left 09/23/2023 Left 10/29/23 Left 12/02/23  Shoulder flexion   89(110) 109(114) 111(115) 90 before scaption (110)  Shoulder abduction   98(120) 109(120) 111(122) 100 (110)  Shoulder adduction        Shoulder extension        Shoulder internal rotation        Shoulder external rotation        Elbow flexion   120(140) 130(136) 141(145)   Elbow extension   -28(-10) -5(0) -18(0)   Wrist flexion   60(60) Rest at 60 50(65) 54(60)   Wrist extension   -48(42) 10(40) 20(50) 30 (45)  Wrist ulnar deviation     12(22)   Wrist radial deviation     10(20)   Wrist pronation     (82)90   Wrist supination     64(74) 70 (75)  (Blank rows = not tested)  Eval: L Digit flexion: 2nd:3cm (0cm) 3rd: 0cm (0cm), 4th: 0cm (0cm), 5th: 2cm (0cm)  09/23/23:  L Digit flexion to [facility]: 2nd: 3.5cm (0cm), 3rd: 4  cm(0cm), 4th 3.5cm (0cm), 5th: 2cm (0cm)  Eval:  L Digit extension: Active Gross digit extension through 10% of ROM  09/23/23:  L Digit extension: Active Gross digit extension through 50% of ROM  10/29/23:  L Digit extension: Active Gross digit extension through 25-50% of ROM  12/02/23:  L digit extension  Active gross ext through 30%   UPPER EXTREMITY MMT:     MMT Right eval Left eval Left 09/23/2023 Left 10/29/23 Left 12/02/23  Shoulder flexion 5 3- 3-/5 3-/5   Shoulder abduction 5 3- 3-/5 3-/5   Shoulder adduction       Shoulder extension       Shoulder internal rotation       Shoulder external rotation       Middle trapezius       Lower trapezius  Elbow flexion 5 TBD 3+/5 4-/5 4+/5  Elbow extension 5 TBD 4-/5 3-/5 4+/5  Wrist flexion   2/5 3-/5 3-  Wrist extension  2- 2/5 2+/5 3-  Wrist ulnar deviation       Wrist radial deviation       Wrist pronation    3/5 3+  Wrist supination    3-/5 3-  (Blank rows = not tested)  HAND FUNCTION:  Eval:  Grip strength: Right: 94#, Left: 1#,  Lateral Key Pinch strength: Right: 14#, Left: 3#  10/29/23:  Grip strength: Left: 16# Lateral Key Pinch strength: Left: 5#  12/02/23: L: 5 lbs, R: 98 # Lateral pinch: Left: 5 lbs   COORDINATION: Box and Blocks: Right: 34 blocks completed in 1 min., Left: 0 blocks completed in 1 min.   10/29/23:  Box and Blocks: Right: 34 blocks completed in 1 min., Left: 2 blocks completed in 1 min.  9 Hole Peg Test: Left: To remove 9 pegs from a vertical position: 1 min. &13 sec.  12/02/23: Box and Blocks: 4 blocks 12/02/23: 4 pegs out in 3 min (multiple pulled and dropped out of the cup)   SENSATION: Sensation feels different in both hands.  L hand Light touch- impaired L hand Proprioception- impaired  EDEMA: Has had hx of edema, and came to Eval session with swelling in the L hand/wrist/forearm.   *pt. has a laceration from his dog on the dorsal aspect of the L hand.   MUSCLE TONE:  Increased tone through the UE and hand flexors.   COGNITION: Overall cognitive status: Within functional limits for tasks assessed  VISION: Subjective report: L sided inattention Baseline vision: Wears glasses for reading only Visual history: Right visual occlusion  VISION ASSESSMENT: To be assessed  PERCEPTION: TBD  PRAXIS: Impaired                                                                                                                 TREATMENT DATE: 12/11/2023  Therapeutic Activity:  -Pt. tolerated ROM to the LUE UE through all joint ranges of the LUE in preparation for self-grooming tasks. -Facilitated diagonal patterns of movement with the LUE in preparation for self-grooming tasks. -Facilitated left hand function skills promoting digit extension grasping cards using fingers on thumb technique for multiple reps while holding the deck of cards in the right hand. -Pt. Focused on adding reps of left forearm supination when flipping, and placing the cards on the table.  Electrical Stimulation-Attended:   -Tx time: 8 min, 30 mA CC, duty cycle 50%, ramp 5 sec, cycle time 10/10;  Intensity: 38 electrodes placed at proximal and distal forearm, dorsal aspect, to promote wrist and digit extension)  -EStim was performed in preparation for wrist, and digit extension for functional releasing of objects.  -Constant monitoring was provided along with cues for wrist and digit extension, and manual assist to attempt to hold extension during the off ramp cycle.  Education: Education details: LUE functioning, wrist, and digit extension, forearm supination Person  educated: Patient and spouse Education method: Explanation and Verbal cues Education comprehension: verbalized understanding  HOME EXERCISE PROGRAM:  -grasping and stacking 1, and 1/2 cubes form the tabletop surface.  GOALS: Goals reviewed with patient? Yes  SHORT TERM GOALS: Target date: 10/01/23  Pt. Will be  independent with HEP for the LUE.  Baseline: 12/03/23: indep with current HEP, though ongoing progressions are being made as UE function improves; 10/29/23: Pt. requires assist from wife with HEPs. Pt. Is consistently working on HEP's at home. 09/19/23: Requires assist from his wife Eval; no current HEP Goal status: Ongoing  LONG TERM GOALS: Target date: 01/21/2024  Pt. Will increase L shoulder flexion by 10 degrees to be able to reach up to shelves.  Baseline: 12/03/23: 90* before onset of scaption; 10/29/23: 111(115) 09/19/23: Pt. Continues to be limited with reaching up to shelves. Eval: L shoulder flexion is 89 (110). Goal status: Ongoing  2.  Pt. Will increase L shoulder abduction by 10 degrees to assist with underarm hygiene.  Baseline: 12/03/23: 100*; pt able to demo bilat underarm hygiene with set up; 10/29/23: [phone]) 09/19/23: Pt. Continues to require assist with underarm hygiene.Eval: L shoulder abduction is 98 (120).  Goal status: ongoing  3.  Pt. Will improve L wrist extension by 10 degrees to be able to initiate anticipation of grasping for objects.  Baseline: 12/03/23: 30* (45); 10/29/23: 20(55) 09/19/23: Consistent activation noted in the left wrist extensors with facilitation. Eval: -48 (42) Goal status: Ongoing  4.  Pt. Will improve active gross digit extension through 50% of the range to be able to release objects in his hand consistently.  Baseline: 12/03/23: Gross digit ext through ~30% of range (limited by flexor tone in the PIPs); 10/28/20: Gross digit extension through 25-50% of the range. 09/19/23: gross digit extension noted through 25% range with facilitation.Eval: gross digit extension is 10% of the range  Goal status: Goal updated to 50% of the range  5.  Pt. Will independently demonstrate visual compensatory strategies when navigating through environment and during tabletop tasks. Baseline: 12/03/23: Pt demonstrates consistent head turns to scan to L visual field without cueing;  10/29/2023: Pt. Continues to less cuing for left sided awareness. 09/19/23: Pt. requires fewer cues for left sided awareness Eval: Pt. Requires consistent cuing for L sided awareness.  Goal status: achieved   6. Pt. Will improve left hand [facility] skills to be able to manipulate small objects during ADLs, and IADLs. Baseline: 12/03/23: Pt removed 3 pegs in >3 min, pulling out more than 3 but repeatedly dropping to floor or table top rather than assessment dish; 10/29/23: 9 Hole Peg Test: Pt. Was able to remove 9 pegs from vertical position on the pegboard in 1 min. & 13 sec. Pt. Is not yet able to pick up pegs from a horizontal position to place them in the pegboard.   Goal status: ongoing   7. Pt will improve left hand function skills as evidenced by improved score to 4 blocks on the Box and Blocks test. Baseline: 12/03/23: 4 blocks after 3 trials and passive wrist and digit stretching between trials (not yet consistent to achieve 4 blocks); 10/29/23: Box and Blocks Test: 2 Blocks completed in 1 min.   Goal Status: ongoing   ASSESSMENT: CLINICAL IMPRESSION:   Pt. is improving with with active forearm supination reps. Pt. requires intervals of alternating wrist, and digit extension at the tabletop surface during each of the tasks to normalize flexor tone and prepare the hand to  re-engage during daily tasks. Pt continues to benefit from OT services to work on improving LUE functioning to increase engagement of the LUE during daily tasks, and to provide education about compensatory strategies during ADLs/IADLs.  PERFORMANCE DEFICITS: in functional skills including ADLs, IADLs, coordination, dexterity, proprioception, sensation, edema, tone, ROM, strength, pain, Fine motor control, Gross motor control, mobility, balance, endurance, vision, and UE functional use, cognitive skills including perception, and psychosocial skills including coping strategies, environmental adaptation, and routines and behaviors.    IMPAIRMENTS: are limiting patient from ADLs, IADLs, and leisure.   CO-MORBIDITIES: may have co-morbidities  that affects occupational performance. Patient will benefit from skilled OT to address above impairments and improve overall function.  MODIFICATION OR ASSISTANCE TO COMPLETE EVALUATION: Min-Moderate modification of tasks or assist with assess necessary to complete an evaluation.  OT OCCUPATIONAL PROFILE AND HISTORY: Detailed assessment: Review of records and additional review of physical, cognitive, psychosocial history related to current functional performance.  CLINICAL DECISION MAKING: Moderate - several treatment options, min-mod task modification necessary  REHAB POTENTIAL: Good  EVALUATION COMPLEXITY: Moderate  PLAN:  OT FREQUENCY: 2x/week  OT DURATION: 12 weeks  PLANNED INTERVENTIONS: 97168 OT Re-evaluation, 97535 self care/ADL training, 02889 therapeutic exercise, 97530 therapeutic activity, 97112 neuromuscular re-education, 97140 manual therapy, 97018 paraffin, 02989 moist heat, 97010 cryotherapy, 97034 contrast bath, 97760 Orthotic Initial, 97763 Orthotic/Prosthetic subsequent, passive range of motion, visual/perceptual remediation/compensation, energy conservation, and DME and/or AE instructions  RECOMMENDED OTHER SERVICES: PT and ST  CONSULTED AND AGREED WITH PLAN OF CARE: Patient and family member/caregiver  PLAN FOR NEXT SESSION: see above  Richardson Otter, MS, OTR/L   12/11/23

## 2023-12-12 ENCOUNTER — Ambulatory Visit

## 2023-12-12 ENCOUNTER — Encounter: Admitting: Speech Pathology

## 2023-12-12 ENCOUNTER — Encounter: Admitting: Physician Assistant

## 2023-12-12 ENCOUNTER — Ambulatory Visit: Admitting: Physical Therapy

## 2023-12-12 DIAGNOSIS — M6281 Muscle weakness (generalized): Secondary | ICD-10-CM

## 2023-12-12 DIAGNOSIS — I69354 Hemiplegia and hemiparesis following cerebral infarction affecting left non-dominant side: Secondary | ICD-10-CM

## 2023-12-12 DIAGNOSIS — R278 Other lack of coordination: Secondary | ICD-10-CM

## 2023-12-12 DIAGNOSIS — E11622 Type 2 diabetes mellitus with other skin ulcer: Secondary | ICD-10-CM | POA: Diagnosis not present

## 2023-12-12 DIAGNOSIS — R269 Unspecified abnormalities of gait and mobility: Secondary | ICD-10-CM

## 2023-12-12 DIAGNOSIS — R2681 Unsteadiness on feet: Secondary | ICD-10-CM

## 2023-12-12 DIAGNOSIS — R262 Difficulty in walking, not elsewhere classified: Secondary | ICD-10-CM

## 2023-12-12 NOTE — Therapy (Signed)
 Occupational Therapy Neuro Treatment Note *Assessed today to determine candidacy for Vivistim*   Patient Name: [name]. MRN: [id number] DOB:06-28-1950, 73 y.o., male Today's Date: 12/14/2023  PCP: Alm Rower, MD REFERRING PROVIDER: Hermann Toribio PARAS, PA-C  END OF SESSION:  OT End of Session - 12/14/23 1816     Visit Number 33    Number of Visits 48    Date for Recertification  01/21/24    OT Start Time 1315    OT Stop Time 1400    OT Time Calculation (min) 45 min    Activity Tolerance Patient tolerated treatment well    Behavior During Therapy WFL for tasks assessed/performed         Past Medical History:  Diagnosis Date   Anemia    Cervical myelopathy (HCC)    Chronic venous insufficiency    [name] (chronic kidney disease), stage III (HCC)    Diabetes mellitus without complication (HCC)    GERD (gastroesophageal reflux disease)    Hypercholesteremia    Hypothyroidism    Kidney stones    about 50 in the past--last in March 4/24   Leg weakness, bilateral    due to Myasthenia Gravis   Myasthenia gravis (HCC)    Spinal stenosis    Vitamin B 12 deficiency    Vitamin B 12 deficiency    Wears hearing aid    bilateral   Past Surgical History:  Procedure Laterality Date   BACK SURGERY  09/2015   CARDIAC CATHETERIZATION     CATARACT EXTRACTION W/ INTRAOCULAR LENS IMPLANT     CATARACT EXTRACTION W/PHACO Right 12/21/2015   Procedure: CATARACT EXTRACTION PHACO AND INTRAOCULAR LENS PLACEMENT (IOC);  Surgeon: Dene Etienne, MD;  Location: [facility];  Service: Ophthalmology;  Laterality: Right;  DIABETIC - insulin  and oral meds   COLONOSCOPY     SHOULDER SURGERY     labrum repair   TONSILLECTOMY     Patient Active Problem List   Diagnosis Date Noted   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) 10/11/2023   High blood pressure 08/13/2023   Anemia 08/13/2023   Constipation 08/13/2023   Myasthenia  gravis (HCC) 08/13/2023   Acute ischemic right MCA stroke (HCC) 07/19/2023   Lymphedema 12/27/2016   Leg pain 07/15/2016   Chronic venous insufficiency 07/15/2016   Swelling of limb 07/15/2016   Type 2 diabetes mellitus (HCC) 07/15/2016   Hyperlipidemia 07/15/2016   ONSET DATE: 07/13/2023  REFERRING DIAG: Acute ischemic R MCA CVA  THERAPY DIAG: muscle weakness (generalized), other lack of coordination, vision disturbance, hemiplegia and hemiparesis following cerebral infarction affecting left non-dominant side (HCC)  Rationale for Evaluation and Treatment: Rehabilitation  SUBJECTIVE:  SUBJECTIVE STATEMENT: Pt/spouse receptive to Fugl-Meyer assessment today.    Pt accompanied by: [name]  PERTINENT HISTORY: Pt. Was [facility] on [facility]/13/2023 after sustaining a CVA while on a trip to the beach. Pt. Was diagnosed with R MCA CVA. Pt. Was admitted to inpatient rehab from 07/19/2023-08/13/2023. Past medical history includes high BP, anemia, and Myasthenia Gravis.   PRECAUTIONS: None  WEIGHT BEARING RESTRICTIONS: No  PAIN: 12/12/23: No pain  FALLS: Has patient fallen in last 6 months? Yes  LIVING ENVIRONMENT: Lives with: lives with their family and lives with their spouse-  Lives in: House/apartment- 2 story home and resides mostly on the 1st floor Stairs: No- Ramped entrance Has following equipment at home:  Vannie, cane, Steadi, shower chair, handheld shower head, bedside  commode  PLOF: Independent and Independent with basic ADLs Independent at home   PATIENT GOALS: Would like to walk 10-20 feet each time.   OBJECTIVE:  Note: Objective measures were completed at Evaluation unless otherwise noted.  HAND DOMINANCE: Right  ADLs: Overall ADLs:  Transfers/ambulation related to ADLs: Eating: Independent, 100% R handed Grooming: independent UB Dressing: Can put on shirt independently LB Dressing: Difficulty with pants and requires assistance getting the pants on his  feet, has difficulty putting on shoes and socks Toileting: Tot A for toilet hygiene. Uses Steadi during toilet transfers. Bathing: Requires assist with using the L arm to wash the R arm. Tub Shower transfers: Roll in shower, built in shower chair, grab bars and hand held shower head.  IADLs: Shopping: Total A (Wife typically did the shopping prior to onset)   Light housekeeping: Total A (wife typically performs prior to onset) Meal Prep: Independent with light snack prep Community mobility: No driving, able to get in/out of the car Medication management: Set up assistance from his wife using a pill box (pt. Is responsible for taking medications at the correct time) Financial management: family members check behind him. Handwriting: TBD Hobbies: gardening, wood working and sports- [employer] Work history: Owns a tax business  MOBILITY STATUS: Needs Assist:    POSTURE COMMENTS:   Sitting balance: Good supported sitting balance  FUNCTIONAL OUTCOME MEASURES:  Fugl-Meyer: see below  UPPER EXTREMITY ROM:    Active ROM Right Eval  [facility] Right 09/23/2023 [facility] Left eval Left 09/23/2023 Left 10/29/23 Left 12/02/23  Shoulder flexion   89(110) 109(114) 111(115) 90 before scaption (110)  Shoulder abduction   98(120) 109(120) 111(122) 100 (110)  Shoulder adduction        Shoulder extension        Shoulder internal rotation        Shoulder external rotation        Elbow flexion   120(140) 130(136) 141(145)   Elbow extension   -28(-10) -5(0) -18(0)   Wrist flexion   60(60) Rest at 60 50(65) 54(60)   Wrist extension   -48(42) 10(40) 20(50) 30 (45)  Wrist ulnar deviation     12(22)   Wrist radial deviation     10(20)   Wrist pronation     (82)90   Wrist supination     64(74) 70 (75)  (Blank rows = not tested)  Eval: L Digit flexion: 2nd:3cm (0cm) 3rd: 0cm (0cm), 4th: 0cm (0cm), 5th: 2cm (0cm)  09/23/23:  L Digit flexion to [facility]: 2nd: 3.5cm (0cm), 3rd: 4 cm(0cm), 4th 3.5cm (0cm), 5th: 2cm  (0cm)  Eval:  L Digit extension: Active Gross digit extension through 10% of ROM  09/23/23:  L Digit extension: Active Gross digit extension through 50% of ROM  10/29/23:  L Digit extension: Active Gross digit extension through 25-50% of ROM  12/02/23:  L digit extension  Active gross ext through 30%   UPPER EXTREMITY MMT:     MMT Right eval Left eval Left 09/23/2023 Left 10/29/23 Left 12/02/23  Shoulder flexion 5 3- 3-/5 3-/5   Shoulder abduction 5 3- 3-/5 3-/5   Shoulder adduction       Shoulder extension       Shoulder internal rotation       Shoulder external rotation       Middle trapezius       Lower trapezius       Elbow flexion 5 TBD 3+/5 4-/5 4+/5  Elbow extension 5 TBD  4-/5 3-/5 4+/5  Wrist flexion   2/5 3-/5 3-  Wrist extension  2- 2/5 2+/5 3-  Wrist ulnar deviation       Wrist radial deviation       Wrist pronation    3/5 3+  Wrist supination    3-/5 3-  (Blank rows = not tested)  HAND FUNCTION:  Eval:  Grip strength: Right: 94#, Left: 1#,  Lateral Key Pinch strength: Right: 14#, Left: 3#  10/29/23:  Grip strength: Left: 16# Lateral Key Pinch strength: Left: 5#  12/02/23: L: 5 lbs, R: 98 # Lateral pinch: Left: 5 lbs   COORDINATION: Box and Blocks: Right: 34 blocks completed in 1 min., Left: 0 blocks completed in 1 min.   10/29/23:  Box and Blocks: Right: 34 blocks completed in 1 min., Left: 2 blocks completed in 1 min.  9 Hole Peg Test: Left: To remove 9 pegs from a vertical position: 1 min. &13 sec.  12/02/23: Box and Blocks: 4 blocks 12/02/23: 4 pegs out in 3 min (multiple pulled and dropped out of the cup)   FUGL-MEYER ASSESSMENT   A. Upper Extremity   I. Reflex Activity:   12/12/23   Flexors (biceps) 2   Extensors (triceps) 2   Total 4/4                 II. Volitional Movement within Synergies:   12/12/23   Flexor Synergy:      Shoulder Elevation 2   Shoulder Retraction 2   Shoulder Abduction (at least 90*) 2   External Rotation 1    Elbow Flexion 2   Forearm Supination 1   Extensor Synergy:      Shoulder Adduction/Internal Rotation 2   Elbow Extension 1   Forearm Pronation 2   Total 15/18     III. Movement Combining Synergies:   12/12/23   Hand to Lumbar Spine 1   Shoulder Flexion at 90*, elbow at 0* 0   Pron/Sup w/elbow at 90* and shoulder at 0* 1   Total 2/6     IV. Movement Out of Synergy   12/12/23   Shoulder Abduction to 90*, elbow at 0*, forearm pronated 0   Shoulder Flexion 9)-180*, elbow at 0*, forearm in mid position 0   Pron/sup, elbow at 0* and shoulder between 30-90* of flexion 0   Total 0/6       B. Wrist   12/12/23   Stability of wrist at 15* ext, elbow at 90*, shoulder 0* 2   Repeated flex/ext, elbow at 90*, shoulder 0* 1   Stability of wrist at 15* ext, elbow at 0*, shoulder 30* 0   Flex/ext, elbow 0*, shoulder 30* 0   Circumduction  1   Total 4/10     C. Hand   12/12/23   Mass Flexion 1   Mass Extension 1   Total 2/4       D. Grasp   12/12/23   Hook  0   Thumb Adduction - paper 1   Pincer - pen 0   Cylindrical - cup/can 0   Spherical - tennis ball 1   Total 2/10     E. Coordination/Speed   12/12/23   Tremor 2   Dysmetria 0   Time 1   Total 3/6     FUGL-MEYER ASSESSMENT: UPPER EXTREMITY A-E TOTAL on (date): 32/66   SENSATION: Sensation feels different in both hands.  L hand Light touch- impaired L hand Proprioception- impaired  EDEMA: Has had hx of edema, and came to Eval session with swelling in the L hand/wrist/forearm.   *pt. has a laceration from his dog on the dorsal aspect of the L hand.   MUSCLE TONE: Increased tone through the UE and hand flexors.   COGNITION: Overall cognitive status: Within functional limits for tasks assessed  VISION: Subjective report: L sided inattention Baseline vision: Wears glasses for reading only Visual history: Right visual occlusion  VISION ASSESSMENT: To be assessed  PERCEPTION: TBD  PRAXIS: Impaired                                                                                                                  TREATMENT DATE: 12/12/2023 Physical Performance Testing: -Fugl-Meyer Completed; see above scoring  Self Care: -Condition management education: Vivistim procedure, description of ideal patients in regards to UE function, motivation, cognition, family support; Q&A phone call with nurse from [facility] to discuss procedure if interested, then surgical consult needed  Education: Education details: Fugl-Meyer assessment, Vivistim procedure Person educated: Patient and spouse Education method: Explanation and Verbal cues Education comprehension: verbalized understanding, further training needed  HOME EXERCISE PROGRAM: -grasping and stacking 1, and 1/2 cubes form the tabletop surface.  GOALS: Goals reviewed with patient? Yes  SHORT TERM GOALS: Target date: 10/01/23  Pt. Will be independent with HEP for the LUE.  Baseline: 12/03/23: indep with current HEP, though ongoing progressions are being made as UE function improves; 10/29/23: Pt. requires assist from wife with HEPs. Pt. Is consistently working on HEP's at home. 09/19/23: Requires assist from his wife Eval; no current HEP Goal status: Ongoing  LONG TERM GOALS: Target date: 01/21/2024  Pt. Will increase L shoulder flexion by 10 degrees to be able to reach up to shelves.  Baseline: 12/03/23: 90* before onset of scaption; 10/29/23: 111(115) 09/19/23: Pt. Continues to be limited with reaching up to shelves. Eval: L shoulder flexion is 89 (110). Goal status: Ongoing  2.  Pt. Will increase L shoulder abduction by 10 degrees to assist with underarm hygiene.  Baseline: 12/03/23: 100*; pt able to demo bilat underarm hygiene with set up; 10/29/23: [phone]) 09/19/23: Pt. Continues to require assist with underarm hygiene.Eval: L shoulder abduction is 98 (120).  Goal status: ongoing  3.  Pt. Will improve L wrist extension by 10 degrees to be able to  initiate anticipation of grasping for objects.  Baseline: 12/03/23: 30* (45); 10/29/23: 20(55) 09/19/23: Consistent activation noted in the left wrist extensors with facilitation. Eval: -48 (42) Goal status: Ongoing  4.  Pt. Will improve active gross digit extension through 50% of the range to be able to release objects in his hand consistently.  Baseline: 12/03/23: Gross digit ext through ~30% of range (limited by flexor tone in the PIPs); 10/28/20: Gross digit extension through 25-50% of the range. 09/19/23: gross digit extension noted through 25% range with facilitation.Eval: gross digit extension is 10% of the range  Goal status: Goal updated to 50% of the range  5.  Pt. Will  independently demonstrate visual compensatory strategies when navigating through environment and during tabletop tasks. Baseline: 12/03/23: Pt demonstrates consistent head turns to scan to L visual field without cueing; 10/29/2023: Pt. Continues to less cuing for left sided awareness. 09/19/23: Pt. requires fewer cues for left sided awareness Eval: Pt. Requires consistent cuing for L sided awareness.  Goal status: achieved   6. Pt. Will improve left hand [facility] skills to be able to manipulate small objects during ADLs, and IADLs. Baseline: 12/03/23: Pt removed 3 pegs in >3 min, pulling out more than 3 but repeatedly dropping to floor or table top rather than assessment dish; 10/29/23: 9 Hole Peg Test: Pt. Was able to remove 9 pegs from vertical position on the pegboard in 1 min. & 13 sec. Pt. Is not yet able to pick up pegs from a horizontal position to place them in the pegboard.   Goal status: ongoing   7. Pt will improve left hand function skills as evidenced by improved score to 4 blocks on the Box and Blocks test. Baseline: 12/03/23: 4 blocks after 3 trials and passive wrist and digit stretching between trials (not yet consistent to achieve 4 blocks); 10/29/23: Box and Blocks Test: 2 Blocks completed in 1 min.   Goal Status: ongoing    ASSESSMENT: CLINICAL IMPRESSION:  Completed Fugl-Meyer assessment this date.  Score of 32/66 indicates pt demonstrates moderate-severe UE impairment and within target score to meet criteria to pursue Vivistim procedure.  Pt and spouse remain very interested in procedure and were receptive to all education provided this date as noted above.  Pt wishes to further discuss Vivistim with spouse over the weekend and prefers to wait until follow up OT visit to move forward with scheduling a Q&A with a nurse from [facility].  Pt reassured of no deadline or commitment needed and was encouraged to continue to discuss this topic with spouse and ask questions of OT as needed.  OT reinforced FM assessment completed today simply identifies pt as meeting UE function criteria for this procedure, and UE function is only 1 aspect of the qualification process. Pt/spouse verbalized understanding.  Pt continues to benefit from OT services to work on improving LUE functioning to increase engagement of the LUE during daily tasks, and to provide education about compensatory strategies during ADLs/IADLs.  PERFORMANCE DEFICITS: in functional skills including ADLs, IADLs, coordination, dexterity, proprioception, sensation, edema, tone, ROM, strength, pain, Fine motor control, Gross motor control, mobility, balance, endurance, vision, and UE functional use, cognitive skills including perception, and psychosocial skills including coping strategies, environmental adaptation, and routines and behaviors.   IMPAIRMENTS: are limiting patient from ADLs, IADLs, and leisure.   CO-MORBIDITIES: may have co-morbidities  that affects occupational performance. Patient will benefit from skilled OT to address above impairments and improve overall function.  MODIFICATION OR ASSISTANCE TO COMPLETE EVALUATION: Min-Moderate modification of tasks or assist with assess necessary to complete an evaluation.  OT OCCUPATIONAL PROFILE AND HISTORY:  Detailed assessment: Review of records and additional review of physical, cognitive, psychosocial history related to current functional performance.  CLINICAL DECISION MAKING: Moderate - several treatment options, min-mod task modification necessary  REHAB POTENTIAL: Good  EVALUATION COMPLEXITY: Moderate  PLAN:  OT FREQUENCY: 2x/week  OT DURATION: 12 weeks  PLANNED INTERVENTIONS: 97168 OT Re-evaluation, 97535 self care/ADL training, 02889 therapeutic exercise, 97530 therapeutic activity, 97112 neuromuscular re-education, 97140 manual therapy, 97018 paraffin, 02989 moist heat, 97010 cryotherapy, 97034 contrast bath, 97760 Orthotic Initial, [id number] Orthotic/Prosthetic subsequent, passive range of motion, visual/perceptual remediation/compensation,  energy conservation, and DME and/or AE instructions  RECOMMENDED OTHER SERVICES: PT and ST  CONSULTED AND AGREED WITH PLAN OF CARE: Patient and family member/caregiver  PLAN FOR NEXT SESSION: see above  Inocente Blazing, MS, OTR/L

## 2023-12-12 NOTE — Therapy (Signed)
 OUTPATIENT PHYSICAL THERAPY TREATMENT  Patient Name: [name]. MRN: [id number] DOB:01/28/1951, 73 y.o., male Today's Date: 12/12/2023 PCP: Dennise Remak, MD  REFERRING PROVIDER:  Pegge Toribio PARAS, PA-C  END OF SESSION:    PT End of Session - 12/12/23 1406     Visit Number 35    Number of Visits 40    Date for Recertification  12/31/23    Authorization Type [employer] Medicare    Authorization Time Period [facility] auth#: [id number] for 6 PT vst from 9/9-10/07 (9/18 is 4 of 6)    Progress Note Due on Visit 40    PT Start Time 1405    PT Stop Time 1443    PT Time Calculation (min) 38 min    Equipment Utilized During Treatment Gait belt    Activity Tolerance Patient tolerated treatment well;No increased pain    Behavior During Therapy WFL for tasks assessed/performed             Past Medical History:  Diagnosis Date   Anemia    Cervical myelopathy (HCC)    Chronic venous insufficiency    [name] (chronic kidney disease), stage III (HCC)    Diabetes mellitus without complication (HCC)    GERD (gastroesophageal reflux disease)    Hypercholesteremia    Hypothyroidism    Kidney stones    about 50 in the past--last in March 4/24   Leg weakness, bilateral    due to Myasthenia Gravis   Myasthenia gravis (HCC)    Spinal stenosis    Vitamin B 12 deficiency    Vitamin B 12 deficiency    Wears hearing aid    bilateral   Past Surgical History:  Procedure Laterality Date   BACK SURGERY  09/2015   CARDIAC CATHETERIZATION     CATARACT EXTRACTION W/ INTRAOCULAR LENS IMPLANT     CATARACT EXTRACTION W/PHACO Right 12/21/2015   Procedure: CATARACT EXTRACTION PHACO AND INTRAOCULAR LENS PLACEMENT (IOC);  Surgeon: Dene Etienne, MD;  Location: [facility];  Service: Ophthalmology;  Laterality: Right;  DIABETIC - insulin  and oral meds   COLONOSCOPY     SHOULDER SURGERY     labrum repair   TONSILLECTOMY     Patient Active Problem List   Diagnosis Date Noted    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) 10/11/2023   High blood pressure 08/13/2023   Anemia 08/13/2023   Constipation 08/13/2023   Myasthenia gravis (HCC) 08/13/2023   Acute ischemic right MCA stroke (HCC) 07/19/2023   Lymphedema 12/27/2016   Leg pain 07/15/2016   Chronic venous insufficiency 07/15/2016   Swelling of limb 07/15/2016   Type 2 diabetes mellitus (HCC) 07/15/2016   Hyperlipidemia 07/15/2016    ONSET DATE: 07/19/23  REFERRING DIAG:  P36.488 (ICD-10-CM) - Cerebral infarction due to unspecified occlusion or stenosis of right middle cerebral artery   THERAPY DIAG:  Muscle weakness (generalized)  Hemiplegia and hemiparesis following cerebral infarction affecting left non-dominant side (HCC)  Difficulty in walking, not elsewhere classified  Unsteadiness on feet  Abnormality of gait and mobility  Other lack of coordination  Rationale for Evaluation and Treatment: Rehabilitation  SUBJECTIVE:  SUBJECTIVE STATEMENT:   Pt states he is doing wonderful. He is excited to go surprise his granddaughter at her golf match after therapy today. Pt states he plans to perform stand pivot transfer to sit on golf cart. Pt's wife states he was really tired after last session. Pt states he had a little bit of soreness in his back after last therapy. No other updates and no reports of stumbles/falls.   PERTINENT HISTORY:  CVA with Left hemiplegia in April 2025. Pt started on a swedish knee cage at [facility] due to uncontrolled hyperextension in stance. Pt had not been moving much since discharging from [facility], as therapists instructed pt to perform transfers only at home due to high fall risk with ambulation. Continues to have significant weakenss in the LUE with  difficulty extending fingers as well as numbness and inattention to the L side of body resulting in multiple cuts on Arm and leg with WC mobility in home. Will be attaining custom power WC from [employer]. Has loaner currently.   PAIN:  Are you having pain? No  PRECAUTIONS: Fall, history of wounds on LLE   *Latex allergy   WEIGHT BEARING RESTRICTIONS: No  FALLS: Has patient fallen in last 6 months? Yes. Number of falls 1  LIVING ENVIRONMENT: Lives with: lives with their spouse Lives in: House/apartment Stairs: Ramps installed while in the Hospital  Has following equipment at home: Vannie - 2 wheeled and Wheelchair (power)  PLOF: Independent with basic ADLs, Independent with household mobility without device, and Independent with community mobility with device with [facility]   PATIENT GOALS: relearn to Walk 15-25ft.   OBJECTIVE:                                                                                                                              TREATMENT DATE: 12/12/2023  Pt arrives in transport chair.   Donned pt's L LE SKC to use during session - noticed much tighter fit around his knee with adjustments, which more appropriately blocks knee hyperextension; however, does leave indention in his skin after being removed. Will need to continue to monitor fit.  Pt participates in gait training throughout session using bari-RW with L hand splint to navigate to/from stairs and to/from treadmill with primarily skilled CGA with intermittent light min A for balance.  Stair navigation training ascending/descending 8 steps + 4 steps (seated break between) (6 height) using B HRs with heavier skilled min/light mod A primarily during descent as follows: Step-to pattern leading with R LE on ascent - pt does well advancing L LE until he becomes fatigued, then his L toes catch on the lip of the stair, but pt is now able to more safely and securely correct this with only min A for balance Step-to  pattern leading with L LE on descent - pt requires skilled heavy min/light mod A for balance and L LE management during descent  Therapist facilitates L foot advancement and positioning  on step for safety L knee still hyperextends when accepting weight, despite SKC, causing posterior lean - requires heavy min A to facilitate anterior weight shift onto L foot for improved balance, but cautious with this to avoid quick movement that would cause L knee instability/buckling Pt attempting to perform sideways pattern on descent; however, this was more challenging as pt has more difficulty taking L lateral step compared to forward step  Pt able to step on/off treadmill in litegait harness for balance support, using B UE support on handrails. Therapist providing manual facilitation for L LE stepping as needed.   Gait training the following trials on the treadmill in litegait harness providing balance support, but not true BWS: 2 min 15 sec; requires 35sec to get from 0.75mph to 1.48mph Totaling 192 ft HR 139bpm immediately following Pt able to sustain reciprocal stepping pattern, until becoming fatigued after ~9min15sec requiring therapist to provide consistent min A to clear L foot during swing and advance it for positive step length Pt reports R knee is getting tired with it going into hyperextension causing minor pain Stepped off treadmill in litegait harness and doffed harness  Gait training ~59ft using bari-RW with skilled CGA primarily with occasional light min A for steadying balance. With fatigue, pt demos L LE excessively adducting causing narrow BOS; however, this is significantly improved with pt continuing to make progress towards more consistent functional ambulator.     PATIENT EDUCATION: Education details: knee cage needs assessmnet as it is causeing crossover gait.  Person educated: Patient and Spouse Education method: Explanation, VC/TC/Demo Education comprehension: verbalized  understanding  HOME EXERCISE PROGRAM: Access Code: [email or web address] URL: [email or web address] Date: 09/11/2023 Prepared by: Massie Dollar  Exercises - Seated Knee Extension AROM  - 1 x daily - 4 x weekly - 3 sets - 10 reps - Seated Hip Abduction  - 1 x daily - 4 x weekly - 3 sets - 10 reps - Seated March  - 1 x daily - 4 x weekly - 3 sets - 10 reps - Sit to Stand with Counter Support  - 1 x daily - 4 x weekly - 3 sets - 10 reps - Supine Short Arc Quad  - 1 x daily - 4 x weekly - 3 sets - 10 reps - Supine Hip Abduction  - 1 x daily - 4 x weekly - 3 sets - 10 reps - Supine Bridge  - 1 x daily - 4 x weekly - 3 sets - 3 reps - 2sec  hold  GOALS: Goals reviewed with patient? Yes  SHORT TERM GOALS: Target date: 12/12/2023  Patient will be independent in home exercise program to improve strength/mobility for better functional independence with ADLs. Baseline: Initiated on 09/11/2023 10/22/2023: will update when appropriate 11/26/2023: patient participating in transfer and gait practice in controlled environment with 2 person assistance at home in addition to HEP Goal status: IN PROGRESS  LONG TERM GOALS: Target date: 12/31/2023  Patient will increase SIS-16 score to equal to or greater than 10points to demonstrate statistically significant improvement in mobility and quality of life.  Baseline: 49/80 09/19/2023: 45/80 10/22/2023: 46/80 11/26/2023: 47/80 Goal status: IN PROGRESS  2.  Patient (> 49 years old) will complete five times sit to stand test in < 15 seconds indicating an increased LE strength and improved balance. Baseline: 1:51min 09/19/2023: 56.39 seconds using R UE support to push-up from armrest of green chair and requiring skilled min A for balance and lifting/lowering  10/22/2023: 27.10 seconds using R  UE support to push-up from armrest  8/12: 27.7 sec with RUE pushing from arm rest.  11/26/2023: 37.42 seconds using R UE support to push-up from armrest of green chair and not  using L UE support with min-modA for facilitating anterior weight shift and lifting to come to stand secondary to fatigue (would benefit from re-testing at next session) Goal status: IN PROGRESS  3.  Patient will increase FIST score by > 6 points to demonstrate decreased fall risk during functional activities Baseline:  43/56  09/23/2023: 53/56 Goal status: MET and UPGRADED  09/23/2023: Patient will increase Berg Balance score to > 45/56 to demonstrate improved balance and decreased fall risk during functional activities and ADLs.  Baseline: 09/23/2023: 14/56 7/31: 20/56 using RW support 11/26/2023: 17/56 without AD support Goal status: IN PROGRESS  4.  Patient will increase 10 meter walk test to >0.77m/s as to improve gait speed for better community ambulation and to reduce fall risk. Baseline: 09/09/2023: 0.103 m/s ( and 37 seconds) using bari-RW with skilled heavy min A and +2 w/c follow for safety  09/19/2023: 0.1m/s using bari-RW with skilled min A of 1 and +2 w/c follow for safety 10/22/2023: 0.1875 m/s using bari-RW with L hand splint, L swedish knee cage, and only skilled light min A of 1 (no wheelchair follow)  8/12: 0.116m/s with RW, and L hand splint. No knee cage on this day.  11/26/2023: Average Normal speed: 0.20 m/s & Average Fast speed: 0.275 m/s using bari-RW with CGA/min A for balance wearing SKC Goal status: IN PROGRESS  5.  Patient will reduce timed up and go to <11 seconds to reduce fall risk and demonstrate improved transfer/gait ability. Baseline: unable to complete turn at 10 ft. Mod-max assist due to poor control of LW on RW, resulting in L lateral LOB  09/09/2023: and 32 seconds using bari-RW with L hand splint and +2 on R side for safety, therapist providing skilled min A on L side (Had pt set-up L hand on RW splint, get L foot in proper positioning, and scoot forward in seat before starting the timer) 09/19/2023: 67min43 seconds using bari-RW with only skilled min A of  1 (Had pt set-up L hand on RW splint, get L foot in proper positioning, and scoot forward in seat before starting the timer) 7/2: 78sec(1:18.125) with RW and min assist overall.  10/22/2023: 1 min, 30 seconds using bari-RW with L hand splint and skilled light min assist  8/12: 1:01 min with RW and L hand splint.  11/26/2023: 51.57 seconds using bari-RW with hand splint, wearing SKC, and CGA-minA for balance Goal status: IN PROGRESS   ASSESSMENT:  CLINICAL IMPRESSION:    Patient arrived motivated to participate in therapy session. Therapy session focused on further stair navigation training as pt has stairs to enter his family vacation home. Patient is able to perform a total of 12 stair navigation training today with 1x seated rest break and requires primarily skilled min A until becoming fatigued then requires light mod A for balance and L LE management. Patient demonstrates significant improvement towards goal of increasing independence with stair navigation. Therapy session also continued to focus on progression of high intensity gait training (HIGT) on treadmill with pt achieving consistent 1.33mph with pt sustaining this speed without manual facilitation until he becomes fatigued, requiring skilled min manual facilitation for L foot clearance and positive step length. Plan to also continue progression of overground gait training in litegait harness without UE support to improve  pt's motor plan of wt shifting onto stance limbs without compensation through R UE and to promote increased balance challenge. Pt will continue to benefit from skilled physical therapy intervention to address impairments, improve QOL, and attain therapy goals.       OBJECTIVE IMPAIRMENTS: Abnormal gait, cardiopulmonary status limiting activity, decreased activity tolerance, decreased balance, decreased cognition, decreased coordination, decreased endurance, decreased knowledge of condition, decreased knowledge of use of DME,  decreased mobility, difficulty walking, decreased ROM, decreased strength, decreased safety awareness, dizziness, hypomobility, increased fascial restrictions, impaired perceived functional ability, increased muscle spasms, impaired sensation, impaired tone, impaired UE functional use, impaired vision/preception, improper body mechanics, postural dysfunction, and obesity.   ACTIVITY LIMITATIONS: carrying, lifting, bending, sitting, standing, squatting, stairs, transfers, bed mobility, bathing, toileting, dressing, reach over head, hygiene/grooming, locomotion level, and caring for others  PARTICIPATION LIMITATIONS: meal prep, cleaning, laundry, medication management, interpersonal relationship, driving, shopping, community activity, and yard work  PERSONAL FACTORS: Age, Past/current experiences, Time since onset of injury/illness/exacerbation, and 3+ comorbidities: MG, HTN, CVA, lymphedema are also affecting patient's functional outcome.   REHAB POTENTIAL: Good  CLINICAL DECISION MAKING: Unstable/unpredictable  EVALUATION COMPLEXITY: High  PLAN:  PT FREQUENCY: 1-2x/week  PT DURATION: 8 weeks  PLANNED INTERVENTIONS: 97164- PT Re-evaluation, 97750- Physical Performance Testing, 97110-Therapeutic exercises, 97530- Therapeutic activity, [id number]- Neuromuscular re-education, 97535- Self Care, [id number]- Manual therapy, [id number]- Gait training, [phone]- Orthotic Initial, [phone]- Orthotic/Prosthetic subsequent, [phone]- Canalith repositioning, [id number]- Electrical stimulation (unattended), [phone]- Electrical stimulation (manual), [id number]- Wound care (first 20 sq cm), 97598- Wound care (each additional 20 sq cm), Patient/Family education, Balance training, Stair training, Taping, Dry Needling, Joint mobilization, Joint manipulation, Vestibular training, Visual/preceptual remediation/compensation, Cognitive remediation, DME instructions, Wheelchair mobility training, Cryotherapy, and Moist heat  PLAN FOR NEXT SESSION:    Re-test 5xSTS & update LTG Review proper mechanics/sequencing of transfers  HIGT (high intensity gait training) using litegait harness either on treadmill vs overground Dynamic gait training using bari-RW vs litegait Backwards Side stepping Obstacle navigation (turning) Working on forward propulsion with increased L hip extension and forward progression of pelvis over L stance phase Dynamic standing balance of trunk rotation Re-assess SKC fit & see if posterior knee strap needs to be tightened   Connell Kiss, PT, DPT, NCS, CSRS Physical Therapist - [place]  [facility]  2:51 PM 12/12/23

## 2023-12-17 ENCOUNTER — Ambulatory Visit: Admitting: Occupational Therapy

## 2023-12-17 ENCOUNTER — Ambulatory Visit: Admitting: Physical Therapy

## 2023-12-17 ENCOUNTER — Encounter: Admitting: Speech Pathology

## 2023-12-17 DIAGNOSIS — M6281 Muscle weakness (generalized): Secondary | ICD-10-CM

## 2023-12-17 DIAGNOSIS — I69354 Hemiplegia and hemiparesis following cerebral infarction affecting left non-dominant side: Secondary | ICD-10-CM

## 2023-12-17 DIAGNOSIS — R269 Unspecified abnormalities of gait and mobility: Secondary | ICD-10-CM

## 2023-12-17 DIAGNOSIS — R278 Other lack of coordination: Secondary | ICD-10-CM

## 2023-12-17 DIAGNOSIS — R2681 Unsteadiness on feet: Secondary | ICD-10-CM

## 2023-12-17 DIAGNOSIS — R262 Difficulty in walking, not elsewhere classified: Secondary | ICD-10-CM

## 2023-12-17 NOTE — Therapy (Signed)
 OUTPATIENT PHYSICAL THERAPY TREATMENT  Patient Name: [name]. MRN: [id number] DOB:11/02/1950, 73 y.o., male Today's Date: 12/17/2023 PCP: Dennise Remak, MD  REFERRING PROVIDER:  Pegge Toribio PARAS, PA-C  END OF SESSION:    PT End of Session - 12/17/23 1535     Visit Number 36    Number of Visits 40    Date for Recertification  12/31/23    Authorization Type [employer] Medicare    Authorization Time Period [facility] auth#: [id number] for 6 PT vst from 9/9-10/07 (9/23 is 5 of 6)    Progress Note Due on Visit 40    PT Start Time 1534    PT Stop Time 1619    PT Time Calculation (min) 45 min    Equipment Utilized During Treatment Gait belt    Activity Tolerance Patient tolerated treatment well;No increased pain    Behavior During Therapy WFL for tasks assessed/performed              Past Medical History:  Diagnosis Date   Anemia    Cervical myelopathy (HCC)    Chronic venous insufficiency    [name] (chronic kidney disease), stage III (HCC)    Diabetes mellitus without complication (HCC)    GERD (gastroesophageal reflux disease)    Hypercholesteremia    Hypothyroidism    Kidney stones    about 50 in the past--last in March 4/24   Leg weakness, bilateral    due to Myasthenia Gravis   Myasthenia gravis (HCC)    Spinal stenosis    Vitamin B 12 deficiency    Vitamin B 12 deficiency    Wears hearing aid    bilateral   Past Surgical History:  Procedure Laterality Date   BACK SURGERY  09/2015   CARDIAC CATHETERIZATION     CATARACT EXTRACTION W/ INTRAOCULAR LENS IMPLANT     CATARACT EXTRACTION W/PHACO Right 12/21/2015   Procedure: CATARACT EXTRACTION PHACO AND INTRAOCULAR LENS PLACEMENT (IOC);  Surgeon: Dene Etienne, MD;  Location: [facility];  Service: Ophthalmology;  Laterality: Right;  DIABETIC - insulin  and oral meds   COLONOSCOPY     SHOULDER SURGERY     labrum repair   TONSILLECTOMY     Patient Active Problem List   Diagnosis Date Noted    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) 10/11/2023   High blood pressure 08/13/2023   Anemia 08/13/2023   Constipation 08/13/2023   Myasthenia gravis (HCC) 08/13/2023   Acute ischemic right MCA stroke (HCC) 07/19/2023   Lymphedema 12/27/2016   Leg pain 07/15/2016   Chronic venous insufficiency 07/15/2016   Swelling of limb 07/15/2016   Type 2 diabetes mellitus (HCC) 07/15/2016   Hyperlipidemia 07/15/2016    ONSET DATE: 07/19/23  REFERRING DIAG:  P36.488 (ICD-10-CM) - Cerebral infarction due to unspecified occlusion or stenosis of right middle cerebral artery   THERAPY DIAG:  Muscle weakness (generalized)  Other lack of coordination  Hemiplegia and hemiparesis following cerebral infarction affecting left non-dominant side (HCC)  Difficulty in walking, not elsewhere classified  Unsteadiness on feet  Abnormality of gait and mobility  Rationale for Evaluation and Treatment: Rehabilitation  SUBJECTIVE:  SUBJECTIVE STATEMENT:   Pt states the golf match was great, stating it was the best day he has had in a long while. Pt states he was exhausted after last therapy session and pt's wife states he slept well that night. Pt and wife stated he did okay with the transfer into the golf cart. Pt/wife reports he is walking at least 1x/day ~12/80ft each direction with 2 laps totaling ~78ft. Pt reports he is feeling better with his turns, although pt/wife states he does do them slowly to be cautious. Denies stumbles/falls. Denies pain.     PERTINENT HISTORY:  CVA with Left hemiplegia in April 2025. Pt started on a swedish knee cage at [facility] due to uncontrolled hyperextension in stance. Pt had not been moving much since discharging from [facility], as therapists instructed pt  to perform transfers only at home due to high fall risk with ambulation. Continues to have significant weakenss in the LUE with difficulty extending fingers as well as numbness and inattention to the L side of body resulting in multiple cuts on Arm and leg with WC mobility in home. Will be attaining custom power WC from [employer]. Has loaner currently.   PAIN:  Are you having pain? No  PRECAUTIONS: Fall, history of wounds on LLE   *Latex allergy   WEIGHT BEARING RESTRICTIONS: No  FALLS: Has patient fallen in last 6 months? Yes. Number of falls 1  LIVING ENVIRONMENT: Lives with: lives with their spouse Lives in: House/apartment Stairs: Ramps installed while in the Hospital  Has following equipment at home: Vannie - 2 wheeled and Wheelchair (power)  PLOF: Independent with basic ADLs, Independent with household mobility without device, and Independent with community mobility with device with [facility]   PATIENT GOALS: relearn to Walk 15-47ft.   OBJECTIVE:                                                                                                                              TREATMENT DATE: 12/17/2023  Pt arrives in transport chair.   Donned pt's L LE SKC to use during session with good fit, adequately blocking knee hyperextension.   Gait training 118ft using bari-RW with skilled light min assist for balance. Pt demonstrating the following gait deviations with therapist providing the described cuing and facilitation for improvement:  Facilitating L hip moving anteriorly, because pt has tendency to keep pelvis rotated towards L with L hip posterior to R Noticed pt keeping body too close to L side of RW, causing him to intermittently hit his thigh on back L leg of RW during swing With fatigue, continues to have min excessive L LE adduction that worsens with further fatigue, later in session pt has ~3x he steps on his R toes with his L heel Pt demos ability to have more continuous/symmetrical  gait speed with increased speed of L swing advancement Pt demonstrates significant improvement in gait endurance   10 Meter Walk Test: Patient instructed to  walk 10 meters (32.8 ft) as quickly and as safely as possible at their normal speed x2 and at a fast speed x2. Time measured from 2 meter mark to 8 meter mark to accommodate ramp-up and ramp-down.  Normal speed 1: 0.33 m/s (29.86 seconds) Normal speed 2: 0.31 m/s (31.93 seconds) Average Normal speed: 0.32 m/s using bari-RW with CGA for safety/steadying Fast speed 1: 0.344 m/s (29.01 seconds) Fast speed 2: 0.377 m/s (26.49 seconds) Average Fast speed: 0.36 m/s using bari-RW requiring CGA/light min A for steadying/safety Cut off scores: <0.4 m/s = household Ambulator, 0.4-0.8 m/s = limited community Ambulator, >0.8 m/s = community Ambulator, >1.2 m/s = crossing a street, <1.0 = increased fall risk MCID 0.05 m/s (small), 0.13 m/s (moderate), 0.06 m/s (significant)  (ANPTA Core Set of Outcome Measures for Adults with Neurologic Conditions, 2018)    Five times Sit to Stand Test (FTSS) "Stand up and sit down as quickly as possible 5 times, keeping your arms folded across your chest."    TIME: 24.59 seconds using R UE support to push-up from armrest of green chair and not using L UE support with CGA/light min A for safety/steadying  TIME: 29.22 seconds as just described; however, with fatigue pt having lack of sufficient anterior trunk lean while coming to stand requiring min/mod A for balance and facilitating weight shift  Times > 13.6 seconds is associated with increased disability and morbidity (Guralnik, 2000) Times > 15 seconds is predictive of recurrent falls in healthy individuals aged 1 and older (Buatois, et al., 2008) Normal performance values in community dwelling individuals aged 71 and older (Bohannon, 2006): 60-69 years: 11.4 seconds 70-79 years: 12.6 seconds 80-89 years: 14.8 seconds  MCID: >= 2.3 seconds for Vestibular  Disorders Ary, 2006)    Educated on recommendation to perform repeated sit<>stands at home for exercise 2x 5 reps Educated on option of using dense blanket to boost seat height Having chair pushed up against wall so that it cannot slide behind him R UE support on stable surface (not just RW) Wife guarding for safety Pt/wife deny need to write this down   Dynamic gait training focusing on AD management, turning, and variable stepping using bari-RW to navigate around cones and ambulate backwards on verbal command of therapist - pt demos significant improvement in AD management and L LE mechanics to step and maintain adequate BOS for balance stability with no more than CGA/light min A.    PATIENT EDUCATION: Education details: knee cage needs assessmnet as it is causeing crossover gait.  Person educated: Patient and Spouse Education method: Explanation, VC/TC/Demo Education comprehension: verbalized understanding  HOME EXERCISE PROGRAM: Access Code: [email or web address] URL: [email or web address] Date: 09/11/2023 Prepared by: Massie Dollar  Exercises - Seated Knee Extension AROM  - 1 x daily - 4 x weekly - 3 sets - 10 reps - Seated Hip Abduction  - 1 x daily - 4 x weekly - 3 sets - 10 reps - Seated March  - 1 x daily - 4 x weekly - 3 sets - 10 reps - Sit to Stand with Counter Support  - 1 x daily - 4 x weekly - 3 sets - 10 reps - Supine Short Arc Quad  - 1 x daily - 4 x weekly - 3 sets - 10 reps - Supine Hip Abduction  - 1 x daily - 4 x weekly - 3 sets - 10 reps - Supine Bridge  - 1 x daily - 4 x weekly - 3 sets -  3 reps - 2sec  hold  12/17/2023: verbal instruction to complete repeated sit<>stands with education on safe set-up provided  GOALS: Goals reviewed with patient? Yes  SHORT TERM GOALS: Target date: 12/12/2023  Patient will be independent in home exercise program to improve strength/mobility for better functional independence with ADLs. Baseline: Initiated on  09/11/2023 10/22/2023: will update when appropriate 11/26/2023: patient participating in transfer and gait practice in controlled environment with 2 person assistance at home in addition to HEP Goal status: IN PROGRESS  LONG TERM GOALS: Target date: 12/31/2023  Patient will increase SIS-16 score to equal to or greater than 10points to demonstrate statistically significant improvement in mobility and quality of life.  Baseline: 49/80 09/19/2023: 45/80 10/22/2023: 46/80 11/26/2023: 47/80 Goal status: IN PROGRESS  2.  Patient (> 6 years old) will complete five times sit to stand test in < 15 seconds indicating an increased LE strength and improved balance. Baseline: 1:73min 09/19/2023: 56.39 seconds using R UE support to push-up from armrest of green chair and requiring skilled min A for balance and lifting/lowering  10/22/2023: 27.10 seconds using R UE support to push-up from armrest  8/12: 27.7 sec with RUE pushing from arm rest.  11/26/2023: 37.42 seconds using R UE support to push-up from armrest of green chair and not using L UE support with min-modA for facilitating anterior weight shift and lifting to come to stand secondary to fatigue (would benefit from re-testing at next session) 12/17/2023: 24.59 seconds using R UE support to push-up from armrest of green chair and not using L UE support with CGA/light min A for safety/steadying  Goal status: IN PROGRESS  3.  Patient will increase FIST score by > 6 points to demonstrate decreased fall risk during functional activities Baseline:  43/56  09/23/2023: 53/56 Goal status: MET and UPGRADED  09/23/2023: Patient will increase Berg Balance score to > 45/56 to demonstrate improved balance and decreased fall risk during functional activities and ADLs.  Baseline: 09/23/2023: 14/56 7/31: 20/56 using RW support 11/26/2023: 17/56 without AD support Goal status: IN PROGRESS  4.  Patient will increase 10 meter walk test to >0.73m/s as to improve gait speed for  better community ambulation and to reduce fall risk. Baseline: 09/09/2023: 0.103 m/s ( and 37 seconds) using bari-RW with skilled heavy min A and +2 w/c follow for safety  09/19/2023: 0.48m/s using bari-RW with skilled min A of 1 and +2 w/c follow for safety 10/22/2023: 0.1875 m/s using bari-RW with L hand splint, L swedish knee cage, and only skilled light min A of 1 (no wheelchair follow)  8/12: 0.149m/s with RW, and L hand splint. No knee cage on this day.  11/26/2023: Average Normal speed: 0.20 m/s & Average Fast speed: 0.275 m/s using bari-RW with CGA/min A for balance wearing [facility] 12/17/2023: Average Normal speed: 0.32 m/s using bari-RW with CGA & Average Fast speed: 0.36 m/s using bari-RW requiring CGA/light min A  Goal status: IN PROGRESS  5.  Patient will reduce timed up and go to <11 seconds to reduce fall risk and demonstrate improved transfer/gait ability. Baseline: unable to complete turn at 10 ft. Mod-max assist due to poor control of LW on RW, resulting in L lateral LOB  09/09/2023: and 32 seconds using bari-RW with L hand splint and +2 on R side for safety, therapist providing skilled min A on L side (Had pt set-up L hand on RW splint, get L foot in proper positioning, and scoot forward in seat before starting  the timer) 09/19/2023: 31min43 seconds using bari-RW with only skilled min A of 1 (Had pt set-up L hand on RW splint, get L foot in proper positioning, and scoot forward in seat before starting the timer) 7/2: 78sec(1:18.125) with RW and min assist overall.  10/22/2023: 1 min, 30 seconds using bari-RW with L hand splint and skilled light min assist  8/12: 1:01 min with RW and L hand splint.  11/26/2023: 51.57 seconds using bari-RW with hand splint, wearing SKC, and CGA-minA for balance Goal status: IN PROGRESS   ASSESSMENT:  CLINICAL IMPRESSION:    Patient arrived motivated to participate in therapy session. Therapy session focused on gait training using bari-RW to carryover  HIGT (high intensity gait training) on treadmill to overground gait. Patient demonstrates significant improvement on following HIGT the past 3 weeks, with his gait speed increasing from 0.52m/s to 0.36m/s and pt performing this with CGA/light min A using bari-RW for overall increased independence. Patient also reports consistent participation in ambulation training at home every day to carryover gains made in therapy. With additional consistent participation in HIGT during therapy, anticipate pt will achieve further independence with gait and become at minimum a functional household level ambulator. Patient also demonstrates improved B LE functional strength on 5XSTS; however, will benefit from training to increase his endurance with transfers so it was given as part of his HEP. Plan to continue high intensity gait training on treadmill as well as progress overground gait training in litegait harness without UE support to improve pt's motor plan of wt shifting onto stance limbs without compensation through R UE, to promote improved balance recovery strategies. Pt will continue to benefit from skilled physical therapy intervention to address impairments, improve QOL, and attain therapy goals.       OBJECTIVE IMPAIRMENTS: Abnormal gait, cardiopulmonary status limiting activity, decreased activity tolerance, decreased balance, decreased cognition, decreased coordination, decreased endurance, decreased knowledge of condition, decreased knowledge of use of DME, decreased mobility, difficulty walking, decreased ROM, decreased strength, decreased safety awareness, dizziness, hypomobility, increased fascial restrictions, impaired perceived functional ability, increased muscle spasms, impaired sensation, impaired tone, impaired UE functional use, impaired vision/preception, improper body mechanics, postural dysfunction, and obesity.   ACTIVITY LIMITATIONS: carrying, lifting, bending, sitting, standing,  squatting, stairs, transfers, bed mobility, bathing, toileting, dressing, reach over head, hygiene/grooming, locomotion level, and caring for others  PARTICIPATION LIMITATIONS: meal prep, cleaning, laundry, medication management, interpersonal relationship, driving, shopping, community activity, and yard work  PERSONAL FACTORS: Age, Past/current experiences, Time since onset of injury/illness/exacerbation, and 3+ comorbidities: MG, HTN, CVA, lymphedema are also affecting patient's functional outcome.   REHAB POTENTIAL: Good  CLINICAL DECISION MAKING: Unstable/unpredictable  EVALUATION COMPLEXITY: High  PLAN:  PT FREQUENCY: 1-2x/week  PT DURATION: 8 weeks  PLANNED INTERVENTIONS: 97164- PT Re-evaluation, 97750- Physical Performance Testing, 97110-Therapeutic exercises, 97530- Therapeutic activity, [id number]- Neuromuscular re-education, 97535- Self Care, [id number]- Manual therapy, [id number]- Gait training, [id number]- Orthotic Initial, [id number]- Orthotic/Prosthetic subsequent, [phone]- Canalith repositioning, H9716- Electrical stimulation (unattended), [id number]- Electrical stimulation (manual), [id number]- Wound care (first 20 sq cm), 97598- Wound care (each additional 20 sq cm), Patient/Family education, Balance training, Stair training, Taping, Dry Needling, Joint mobilization, Joint manipulation, Vestibular training, Visual/preceptual remediation/compensation, Cognitive remediation, DME instructions, Wheelchair mobility training, Cryotherapy, and Moist heat  PLAN FOR NEXT SESSION:   Follow-up on addition of STS to HEP HIGT (high intensity gait training) using litegait harness either on treadmill vs overground Dynamic gait training using bari-RW vs litegait Backwards Side stepping Obstacle navigation (turning) Working on forward  propulsion with increased L hip extension and forward progression of pelvis over L stance phase Dynamic standing balance of trunk rotation Continue monitoring SKC fit & see if posterior knee  strap needs to be adjusted   Connell Kiss, PT, DPT, NCS, CSRS Physical Therapist -   [facility]  5:18 PM 12/17/23

## 2023-12-17 NOTE — Therapy (Addendum)
 Occupational Therapy Neuro Treatment Note    Patient Name: [name]. MRN: [id number] DOB:07-30-1950, 73 y.o., male Today's Date: 12/17/2023  PCP: Alm Rower, MD REFERRING PROVIDER: Hermann Toribio PARAS, PA-C  END OF SESSION:  OT End of Session - 12/17/23 1720     Visit Number 34    Number of Visits 48    Date for Recertification  01/21/24    OT Start Time 1445    OT Stop Time 1530    OT Time Calculation (min) 45 min    Activity Tolerance Patient tolerated treatment well    Behavior During Therapy [facility] for tasks assessed/performed         Past Medical History:  Diagnosis Date   Anemia    Cervical myelopathy (HCC)    Chronic venous insufficiency    [name] (chronic kidney disease), stage III (HCC)    Diabetes mellitus without complication (HCC)    GERD (gastroesophageal reflux disease)    Hypercholesteremia    Hypothyroidism    Kidney stones    about 50 in the past--last in March 4/24   Leg weakness, bilateral    due to Myasthenia Gravis   Myasthenia gravis (HCC)    Spinal stenosis    Vitamin B 12 deficiency    Vitamin B 12 deficiency    Wears hearing aid    bilateral   Past Surgical History:  Procedure Laterality Date   BACK SURGERY  09/2015   CARDIAC CATHETERIZATION     CATARACT EXTRACTION W/ INTRAOCULAR LENS IMPLANT     CATARACT EXTRACTION W/PHACO Right 12/21/2015   Procedure: CATARACT EXTRACTION PHACO AND INTRAOCULAR LENS PLACEMENT (IOC);  Surgeon: Dene Etienne, MD;  Location: [facility];  Service: Ophthalmology;  Laterality: Right;  DIABETIC - insulin  and oral meds   COLONOSCOPY     SHOULDER SURGERY     labrum repair   TONSILLECTOMY     Patient Active Problem List   Diagnosis Date Noted   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) 10/11/2023   High blood pressure 08/13/2023   Anemia 08/13/2023   Constipation 08/13/2023   Myasthenia gravis (HCC) 08/13/2023   Acute ischemic right MCA  stroke (HCC) 07/19/2023   Lymphedema 12/27/2016   Leg pain 07/15/2016   Chronic venous insufficiency 07/15/2016   Swelling of limb 07/15/2016   Type 2 diabetes mellitus (HCC) 07/15/2016   Hyperlipidemia 07/15/2016   ONSET DATE: 07/13/2023  REFERRING DIAG: Acute ischemic R MCA CVA  THERAPY DIAG: muscle weakness (generalized), other lack of coordination, vision disturbance, hemiplegia and hemiparesis following cerebral infarction affecting left non-dominant side (HCC)  Rationale for Evaluation and Treatment: Rehabilitation  SUBJECTIVE:  SUBJECTIVE STATEMENT: Pt. Reports It went very well going to watch her grandaughter play golf in a golf cart. Pt accompanied by: [name]  PERTINENT HISTORY: Pt. Was admitted to [facility] on 07/13/2023 after sustaining a CVA while on a trip to the beach. Pt. Was diagnosed with R MCA CVA. Pt. Was admitted to inpatient rehab from 07/19/2023-08/13/2023. Past medical history includes high BP, anemia, and Myasthenia Gravis.   PRECAUTIONS: None  WEIGHT BEARING RESTRICTIONS: No  PAIN: 12/17/23: No pain  FALLS: Has patient fallen in last 6 months? Yes  LIVING ENVIRONMENT: Lives with: lives with their family and lives with their spouse-  Lives in: House/apartment- 2 story home and resides mostly on the 1st floor Stairs: No- Ramped entrance Has following equipment at home:  Vannie, cane, Steadi, shower chair, handheld shower  head, bedside commode  PLOF: Independent and Independent with basic ADLs Independent at home   PATIENT GOALS: Would like to walk 10-20 feet each time.   OBJECTIVE:  Note: Objective measures were completed at Evaluation unless otherwise noted.  HAND DOMINANCE: Right  ADLs: Overall ADLs:  Transfers/ambulation related to ADLs: Eating: Independent, 100% R handed Grooming: independent UB Dressing: Can put on shirt independently LB Dressing: Difficulty with pants and requires assistance getting the pants on his feet, has difficulty  putting on shoes and socks Toileting: Tot A for toilet hygiene. Uses Steadi during toilet transfers. Bathing: Requires assist with using the L arm to wash the R arm. Tub Shower transfers: Roll in shower, built in shower chair, grab bars and hand held shower head.  IADLs: Shopping: Total A (Wife typically did the shopping prior to onset)   Light housekeeping: Total A (wife typically performs prior to onset) Meal Prep: Independent with light snack prep Community mobility: No driving, able to get in/out of the car Medication management: Set up assistance from his wife using a pill box (pt. Is responsible for taking medications at the correct time) Financial management: family members check behind him. Handwriting: TBD Hobbies: gardening, wood working and sports- [employer] Work history: Owns a tax business  MOBILITY STATUS: Needs Assist:    POSTURE COMMENTS:   Sitting balance: Good supported sitting balance  FUNCTIONAL OUTCOME MEASURES:  Fugl-Meyer: see below  UPPER EXTREMITY ROM:    Active ROM Right Eval  [facility] Right 09/23/2023 [facility] Left eval Left 09/23/2023 Left 10/29/23 Left 12/02/23  Shoulder flexion   89(110) 109(114) 111(115) 90 before scaption (110)  Shoulder abduction   98(120) 109(120) 111(122) 100 (110)  Shoulder adduction        Shoulder extension        Shoulder internal rotation        Shoulder external rotation        Elbow flexion   120(140) 130(136) 141(145)   Elbow extension   -28(-10) -5(0) -18(0)   Wrist flexion   60(60) Rest at 60 50(65) 54(60)   Wrist extension   -48(42) 10(40) 20(50) 30 (45)  Wrist ulnar deviation     12(22)   Wrist radial deviation     10(20)   Wrist pronation     (82)90   Wrist supination     64(74) 70 (75)  (Blank rows = not tested)  Eval: L Digit flexion: 2nd:3cm (0cm) 3rd: 0cm (0cm), 4th: 0cm (0cm), 5th: 2cm (0cm)  09/23/23:  L Digit flexion to [facility]: 2nd: 3.5cm (0cm), 3rd: 4 cm(0cm), 4th 3.5cm (0cm), 5th: 2cm (0cm)  Eval:  L  Digit extension: Active Gross digit extension through 10% of ROM  09/23/23:  L Digit extension: Active Gross digit extension through 50% of ROM  10/29/23:  L Digit extension: Active Gross digit extension through 25-50% of ROM  12/02/23:  L digit extension  Active gross ext through 30%   UPPER EXTREMITY MMT:     MMT Right eval Left eval Left 09/23/2023 Left 10/29/23 Left 12/02/23  Shoulder flexion 5 3- 3-/5 3-/5   Shoulder abduction 5 3- 3-/5 3-/5   Shoulder adduction       Shoulder extension       Shoulder internal rotation       Shoulder external rotation       Middle trapezius       Lower trapezius       Elbow flexion 5 TBD 3+/5 4-/5 4+/5  Elbow extension  5 TBD 4-/5 3-/5 4+/5  Wrist flexion   2/5 3-/5 3-  Wrist extension  2- 2/5 2+/5 3-  Wrist ulnar deviation       Wrist radial deviation       Wrist pronation    3/5 3+  Wrist supination    3-/5 3-  (Blank rows = not tested)  HAND FUNCTION:  Eval:  Grip strength: Right: 94#, Left: 1#,  Lateral Key Pinch strength: Right: 14#, Left: 3#  10/29/23:  Grip strength: Left: 16# Lateral Key Pinch strength: Left: 5#  12/02/23: L: 5 lbs, R: 98 # Lateral pinch: Left: 5 lbs   COORDINATION: Box and Blocks: Right: 34 blocks completed in 1 min., Left: 0 blocks completed in 1 min.   10/29/23:  Box and Blocks: Right: 34 blocks completed in 1 min., Left: 2 blocks completed in 1 min.  9 Hole Peg Test: Left: To remove 9 pegs from a vertical position: 1 min. &13 sec.  12/02/23: Box and Blocks: 4 blocks 12/02/23: 4 pegs out in 3 min (multiple pulled and dropped out of the cup)   FUGL-MEYER ASSESSMENT   A. Upper Extremity   I. Reflex Activity:   12/12/23   Flexors (biceps) 2   Extensors (triceps) 2   Total 4/4                 II. Volitional Movement within Synergies:   12/12/23   Flexor Synergy:      Shoulder Elevation 2   Shoulder Retraction 2   Shoulder Abduction (at least 90*) 2   External Rotation 1   Elbow Flexion 2    Forearm Supination 1   Extensor Synergy:      Shoulder Adduction/Internal Rotation 2   Elbow Extension 1   Forearm Pronation 2   Total 15/18     III. Movement Combining Synergies:   12/12/23   Hand to Lumbar Spine 1   Shoulder Flexion at 90*, elbow at 0* 0   Pron/Sup w/elbow at 90* and shoulder at 0* 1   Total 2/6     IV. Movement Out of Synergy   12/12/23   Shoulder Abduction to 90*, elbow at 0*, forearm pronated 0   Shoulder Flexion 9)-180*, elbow at 0*, forearm in mid position 0   Pron/sup, elbow at 0* and shoulder between 30-90* of flexion 0   Total 0/6       B. Wrist   12/12/23   Stability of wrist at 15* ext, elbow at 90*, shoulder 0* 2   Repeated flex/ext, elbow at 90*, shoulder 0* 1   Stability of wrist at 15* ext, elbow at 0*, shoulder 30* 0   Flex/ext, elbow 0*, shoulder 30* 0   Circumduction  1   Total 4/10     C. Hand   12/12/23   Mass Flexion 1   Mass Extension 1   Total 2/4       D. Grasp   12/12/23   Hook  0   Thumb Adduction - paper 1   Pincer - pen 0   Cylindrical - cup/can 0   Spherical - tennis ball 1   Total 2/10     E. Coordination/Speed   12/12/23   Tremor 2   Dysmetria 0   Time 1   Total 3/6     FUGL-MEYER ASSESSMENT: UPPER EXTREMITY A-E TOTAL on (date): 32/66   SENSATION: Sensation feels different in both hands.  L hand Light touch- impaired L hand Proprioception-  impaired  EDEMA: Has had hx of edema, and came to Eval session with swelling in the L hand/wrist/forearm.   *pt. has a laceration from his dog on the dorsal aspect of the L hand.   MUSCLE TONE: Increased tone through the UE and hand flexors.   COGNITION: Overall cognitive status: Within functional limits for tasks assessed  VISION: Subjective report: L sided inattention Baseline vision: Wears glasses for reading only Visual history: Right visual occlusion  VISION ASSESSMENT: To be assessed  PERCEPTION: TBD  PRAXIS: Impaired                                                                                                                  TREATMENT DATE: 12/17/2023  Therapeutic Activity:   -Pt. tolerated ROM to the LUE UE through all joint ranges of the LUE in preparation for self-grooming tasks. -Facilitated diagonal patterns of movement with the LUE in preparation for self-grooming tasks. -Facilitated reps of LUE functional reaching using then Vertical shape tower with emphasis placed on grasping the shapes, and securely holding a lateral grasp while combining  reaching up with supinating/pronating the forearm to secure the shapes into place.   Electrical Stimulation-Attended:    -Tx time: 8 min, 30 mA CC, duty cycle 50%, ramp 5 sec, cycle time 10/10;  Intensity: 40 electrodes placed at proximal and distal forearm, dorsal aspect, to promote wrist and digit extension)  -EStim was performed in preparation for wrist, and digit extension for functional releasing of objects.  -Constant monitoring was provided along with cues for wrist and digit extension, and manual assist to attempt to hold extension during the off ramp cycle.      Education: Education details:  LEU reaching with combinations of distal movements in preparation for securing the shapes in place. Person educated: Patient and spouse Education method: Explanation and Verbal cues Education comprehension: verbalized understanding, further training needed  HOME EXERCISE PROGRAM: -grasping and stacking 1, and 1/2 cubes form the tabletop surface.  GOALS: Goals reviewed with patient? Yes  SHORT TERM GOALS: Target date: 10/01/23  Pt. Will be independent with HEP for the LUE.  Baseline: 12/03/23: indep with current HEP, though ongoing progressions are being made as UE function improves; 10/29/23: Pt. requires assist from wife with HEPs. Pt. Is consistently working on HEP's at home. 09/19/23: Requires assist from his wife Eval; no current HEP Goal status: Ongoing  LONG TERM GOALS: Target  date: 01/21/2024  Pt. Will increase L shoulder flexion by 10 degrees to be able to reach up to shelves.  Baseline: 12/03/23: 90* before onset of scaption; 10/29/23: 111(115) 09/19/23: Pt. Continues to be limited with reaching up to shelves. Eval: L shoulder flexion is 89 (110). Goal status: Ongoing  2.  Pt. Will increase L shoulder abduction by 10 degrees to assist with underarm hygiene.  Baseline: 12/03/23: 100*; pt able to demo bilat underarm hygiene with set up; 10/29/23: [phone]) 09/19/23: Pt. Continues to require assist with underarm hygiene.Eval: L shoulder abduction is 98 (120).  Goal  status: ongoing  3.  Pt. Will improve L wrist extension by 10 degrees to be able to initiate anticipation of grasping for objects.  Baseline: 12/03/23: 30* (45); 10/29/23: 20(55) 09/19/23: Consistent activation noted in the left wrist extensors with facilitation. Eval: -48 (42) Goal status: Ongoing  4.  Pt. Will improve active gross digit extension through 50% of the range to be able to release objects in his hand consistently.  Baseline: 12/03/23: Gross digit ext through ~30% of range (limited by flexor tone in the PIPs); 10/28/20: Gross digit extension through 25-50% of the range. 09/19/23: gross digit extension noted through 25% range with facilitation.Eval: gross digit extension is 10% of the range  Goal status: Goal updated to 50% of the range  5.  Pt. Will independently demonstrate visual compensatory strategies when navigating through environment and during tabletop tasks. Baseline: 12/03/23: Pt demonstrates consistent head turns to scan to L visual field without cueing; 10/29/2023: Pt. Continues to less cuing for left sided awareness. 09/19/23: Pt. requires fewer cues for left sided awareness Eval: Pt. Requires consistent cuing for L sided awareness.  Goal status: achieved   6. Pt. Will improve left hand [facility] skills to be able to manipulate small objects during ADLs, and IADLs. Baseline: 12/03/23: Pt removed 3 pegs in >3  min, pulling out more than 3 but repeatedly dropping to floor or table top rather than assessment dish; 10/29/23: 9 Hole Peg Test: Pt. Was able to remove 9 pegs from vertical position on the pegboard in 1 min. & 13 sec. Pt. Is not yet able to pick up pegs from a horizontal position to place them in the pegboard.   Goal status: ongoing   7. Pt will improve left hand function skills as evidenced by improved score to 4 blocks on the Box and Blocks test. Baseline: 12/03/23: 4 blocks after 3 trials and passive wrist and digit stretching between trials (not yet consistent to achieve 4 blocks); 10/29/23: Box and Blocks Test: 2 Blocks completed in 1 min.   Goal Status: ongoing   ASSESSMENT:  CLINICAL IMPRESSION:   Pt. required tapering support at the left forearm, and hand with more support required initially and tapering off as Pt. was able to more securely hold the shapes in a lateral grasp while combining supination and pronation in preparation for placing them up onto the vertical shape tower. Pt. tolerated Estim well at intensity of 40 with cues required to hold wirst, and digit extension during the off ramp for neuromuscular motor relearning. Pt continues to benefit from OT services to work on improving LUE functioning to increase engagement of the LUE during daily tasks, and to provide education about compensatory strategies during ADLs/IADLs.  PERFORMANCE DEFICITS: in functional skills including ADLs, IADLs, coordination, dexterity, proprioception, sensation, edema, tone, ROM, strength, pain, Fine motor control, Gross motor control, mobility, balance, endurance, vision, and UE functional use, cognitive skills including perception, and psychosocial skills including coping strategies, environmental adaptation, and routines and behaviors.   IMPAIRMENTS: are limiting patient from ADLs, IADLs, and leisure.   CO-MORBIDITIES: may have co-morbidities  that affects occupational performance. Patient will benefit  from skilled OT to address above impairments and improve overall function.  MODIFICATION OR ASSISTANCE TO COMPLETE EVALUATION: Min-Moderate modification of tasks or assist with assess necessary to complete an evaluation.  OT OCCUPATIONAL PROFILE AND HISTORY: Detailed assessment: Review of records and additional review of physical, cognitive, psychosocial history related to current functional performance.  CLINICAL DECISION MAKING: Moderate - several treatment options,  min-mod task modification necessary  REHAB POTENTIAL: Good  EVALUATION COMPLEXITY: Moderate  PLAN:  OT FREQUENCY: 2x/week  OT DURATION: 12 weeks  PLANNED INTERVENTIONS: 97168 OT Re-evaluation, 97535 self care/ADL training, 02889 therapeutic exercise, 97530 therapeutic activity, 97112 neuromuscular re-education, 97140 manual therapy, 97018 paraffin, 02989 moist heat, 97010 cryotherapy, 97034 contrast bath, 97760 Orthotic Initial, 97763 Orthotic/Prosthetic subsequent, passive range of motion, visual/perceptual remediation/compensation, energy conservation, and DME and/or AE instructions  RECOMMENDED OTHER SERVICES: PT and ST  CONSULTED AND AGREED WITH PLAN OF CARE: Patient and family member/caregiver  PLAN FOR NEXT SESSION: see above  Richardson Otter, MS, OTR/L

## 2023-12-19 ENCOUNTER — Encounter: Admitting: Speech Pathology

## 2023-12-19 ENCOUNTER — Ambulatory Visit: Admitting: Physical Therapy

## 2023-12-19 ENCOUNTER — Encounter: Admitting: Physician Assistant

## 2023-12-19 ENCOUNTER — Ambulatory Visit

## 2023-12-19 DIAGNOSIS — R262 Difficulty in walking, not elsewhere classified: Secondary | ICD-10-CM

## 2023-12-19 DIAGNOSIS — E11622 Type 2 diabetes mellitus with other skin ulcer: Secondary | ICD-10-CM | POA: Diagnosis not present

## 2023-12-19 DIAGNOSIS — R2681 Unsteadiness on feet: Secondary | ICD-10-CM

## 2023-12-19 DIAGNOSIS — R269 Unspecified abnormalities of gait and mobility: Secondary | ICD-10-CM

## 2023-12-19 DIAGNOSIS — M6281 Muscle weakness (generalized): Secondary | ICD-10-CM

## 2023-12-19 DIAGNOSIS — R278 Other lack of coordination: Secondary | ICD-10-CM

## 2023-12-19 DIAGNOSIS — I69354 Hemiplegia and hemiparesis following cerebral infarction affecting left non-dominant side: Secondary | ICD-10-CM

## 2023-12-19 NOTE — Therapy (Signed)
 OUTPATIENT PHYSICAL THERAPY TREATMENT  Patient Name: [name]. MRN: [id number] DOB:April 03, 1950, 73 y.o., male Today's Date: 12/19/2023 PCP: Dennise Remak, MD  REFERRING PROVIDER:  Pegge Toribio PARAS, PA-C  END OF SESSION:    PT End of Session - 12/19/23 1404     Visit Number 37    Number of Visits 40    Date for Recertification  12/31/23    Authorization Type [employer] Medicare    Authorization Time Period [facility] auth#: [id number] for 6 PT vst from 9/9-10/07 (correction 9/25 is 5 of 6)    Progress Note Due on Visit 40    PT Start Time 1400    PT Stop Time 1447    PT Time Calculation (min) 47 min    Equipment Utilized During Treatment Gait belt    Activity Tolerance Patient tolerated treatment well;No increased pain    Behavior During Therapy WFL for tasks assessed/performed           Past Medical History:  Diagnosis Date   Anemia    Cervical myelopathy (HCC)    Chronic venous insufficiency    [name] (chronic kidney disease), stage III (HCC)    Diabetes mellitus without complication (HCC)    GERD (gastroesophageal reflux disease)    Hypercholesteremia    Hypothyroidism    Kidney stones    about 50 in the past--last in March 4/24   Leg weakness, bilateral    due to Myasthenia Gravis   Myasthenia gravis (HCC)    Spinal stenosis    Vitamin B 12 deficiency    Vitamin B 12 deficiency    Wears hearing aid    bilateral   Past Surgical History:  Procedure Laterality Date   BACK SURGERY  09/2015   CARDIAC CATHETERIZATION     CATARACT EXTRACTION W/ INTRAOCULAR LENS IMPLANT     CATARACT EXTRACTION W/PHACO Right 12/21/2015   Procedure: CATARACT EXTRACTION PHACO AND INTRAOCULAR LENS PLACEMENT (IOC);  Surgeon: Dene Etienne, MD;  Location: [facility];  Service: Ophthalmology;  Laterality: Right;  DIABETIC - insulin  and oral meds   COLONOSCOPY     SHOULDER SURGERY     labrum repair   TONSILLECTOMY     Patient Active Problem List   Diagnosis Date Noted    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) 10/11/2023   High blood pressure 08/13/2023   Anemia 08/13/2023   Constipation 08/13/2023   Myasthenia gravis (HCC) 08/13/2023   Acute ischemic right MCA stroke (HCC) 07/19/2023   Lymphedema 12/27/2016   Leg pain 07/15/2016   Chronic venous insufficiency 07/15/2016   Swelling of limb 07/15/2016   Type 2 diabetes mellitus (HCC) 07/15/2016   Hyperlipidemia 07/15/2016    ONSET DATE: 07/19/23  REFERRING DIAG:  P36.488 (ICD-10-CM) - Cerebral infarction due to unspecified occlusion or stenosis of right middle cerebral artery   THERAPY DIAG:  Muscle weakness (generalized)  Other lack of coordination  Hemiplegia and hemiparesis following cerebral infarction affecting left non-dominant side (HCC)  Difficulty in walking, not elsewhere classified  Unsteadiness on feet  Abnormality of gait and mobility  Rationale for Evaluation and Treatment: Rehabilitation  SUBJECTIVE:  SUBJECTIVE STATEMENT:   Patient and wife disappointment with his limited insurance approval for additional skilled physical therapy visits. Educated patient on option of going to [facility] if insurance does not approve additional visits because patient needs continued skilled physical therapy to further progress his independence with functional mobility.  Denies falls/stumbles, but continues to state feeling min instability with turning at home. Denies pain.   PERTINENT HISTORY:  CVA with Left hemiplegia in April 2025. Pt started on a swedish knee cage at CIR due to uncontrolled hyperextension in stance. Pt had not been moving much since discharging from [facility], as therapists instructed pt to perform transfers  only at home due to high fall risk with ambulation. Continues to have significant weakenss in the LUE with difficulty extending fingers as well as numbness and inattention to the L side of body resulting in multiple cuts on Arm and leg with WC mobility in home. Will be attaining custom power WC from [employer]. Has loaner currently.   PAIN:  Are you having pain? No  PRECAUTIONS: Fall, history of wounds on LLE   *Latex allergy   WEIGHT BEARING RESTRICTIONS: No  FALLS: Has patient fallen in last 6 months? Yes. Number of falls 1  LIVING ENVIRONMENT: Lives with: lives with their spouse Lives in: House/apartment Stairs: Ramps installed while in the Hospital  Has following equipment at home: Vannie - 2 wheeled and Wheelchair (power)  PLOF: Independent with basic ADLs, Independent with household mobility without device, and Independent with community mobility with device with [facility]   PATIENT GOALS: relearn to Walk 15-19ft.   OBJECTIVE:                                                                                                                              TREATMENT DATE: 12/19/2023  Pt arrives in transport chair.   Donned pt's L LE SKC to use during session with good fit, adequately blocking knee hyperextension.    Stair navigation training ascending/descending 8 steps (6 height) using B HRs with heavier skilled min A primarily during descent as follows: Unable to keep L hand grasp on railing during descent; therefore, only using R UE support Step-to pattern leading with R LE on ascent - pt does well advancing L LE without his L toes catching on the lip of the stair Only requires light min A for steadying during ascent! Step-to pattern leading with L LE on descent  Requires skilled heavy min A primarily for L LE management onto next step during descent, and light min A for balance L knee goes into full extension when accepting weight, causing minor posterior lean - requires min A to  facilitate anterior weight shift onto L foot for improved balance Continued cuing to maintain forward step-to pattern on descent, rather than attempting sideways stepping  Gait over to treadmill using bari-RW with CGA and only 1x light skilled min A due to minor posterior LOB -  therapist able to anticipate direction of LOB, which is why only min A was required to recover upright.  Pt able to step on/off treadmill in litegait harness for balance support, using B UE support on handrails. Therapist providing manual facilitation for L LE posterior stepping off treadmill.   HR: 108bpm to start  Goal HR to be at 80% HR max during high intensity gait training: 127bpm    Gait training the following trials on the treadmill in litegait harness providing balance support, but not true BWS: B UE support on litegait handles throughout 59min28sec, 0.47mph to 1.36mph in 40 seconds, totaling 253ft Min A for L LE management when L hand slipping off handle, but otherwise able to advance L LE independently HR 146bpm, Borg RPE 10/20 Standing rest break HR recovers to 123bpm, then 113bpm Requires ACE wrap around L hand to maintain grasp on handle at 0.66mph increased to 1.33mph within 18seconds, totaling 23ft HR 152bpm, Borg RPE: 13/20 Able to make it 43min30sec with only 1x skilled manual facilitation to advance L LE during swing, then for final 30sec requires consistent manual facilitation to clear and advance L LE during swing Achieves consistent reciprocal pattern!! Standing rest break, HR recovers to 117bpm 0.61mph trying not to hold on with R hand  Pt able to let go every other step Increased harness support to provide light BWS while attempting gait without R UE support  Stepped off treadmill in litegait harness and doffed harness  Educated pt on recommendation to start participating in more purposeful ambulation in the home using bari-RW to navigate between rooms with safe set-up and having his  adult children present for additional balance support.    PATIENT EDUCATION: Education details: knee cage needs assessmnet as it is causeing crossover gait.  Person educated: Patient and Spouse Education method: Explanation, VC/TC/Demo Education comprehension: verbalized understanding  HOME EXERCISE PROGRAM: Access Code: [email or web address] URL: [email or web address] Date: 09/11/2023 Prepared by: Massie Dollar  Exercises - Seated Knee Extension AROM  - 1 x daily - 4 x weekly - 3 sets - 10 reps - Seated Hip Abduction  - 1 x daily - 4 x weekly - 3 sets - 10 reps - Seated March  - 1 x daily - 4 x weekly - 3 sets - 10 reps - Sit to Stand with Counter Support  - 1 x daily - 4 x weekly - 3 sets - 10 reps - Supine Short Arc Quad  - 1 x daily - 4 x weekly - 3 sets - 10 reps - Supine Hip Abduction  - 1 x daily - 4 x weekly - 3 sets - 10 reps - Supine Bridge  - 1 x daily - 4 x weekly - 3 sets - 3 reps - 2sec  hold  12/17/2023: verbal instruction to complete repeated sit<>stands with education on safe set-up provided  GOALS: Goals reviewed with patient? Yes  SHORT TERM GOALS: Target date: 12/12/2023  Patient will be independent in home exercise program to improve strength/mobility for better functional independence with ADLs. Baseline: Initiated on 09/11/2023 10/22/2023: will update when appropriate 11/26/2023: patient participating in transfer and gait practice in controlled environment with 2 person assistance at home in addition to HEP Goal status: IN PROGRESS  LONG TERM GOALS: Target date: 12/31/2023  Patient will increase SIS-16 score to equal to or greater than 10points to demonstrate statistically significant improvement in mobility and quality of life.  Baseline: 49/80 09/19/2023: 45/80 10/22/2023: 46/80 11/26/2023: 47/80 Goal status: IN PROGRESS  2.  Patient (> 47 years old) will complete five times sit to stand test in < 15 seconds indicating an increased LE strength and  improved balance. Baseline: 1:22min 09/19/2023: 56.39 seconds using R UE support to push-up from armrest of green chair and requiring skilled min A for balance and lifting/lowering  10/22/2023: 27.10 seconds using R UE support to push-up from armrest  8/12: 27.7 sec with RUE pushing from arm rest.  11/26/2023: 37.42 seconds using R UE support to push-up from armrest of green chair and not using L UE support with min-modA for facilitating anterior weight shift and lifting to come to stand secondary to fatigue (would benefit from re-testing at next session) 12/17/2023: 24.59 seconds using R UE support to push-up from armrest of green chair and not using L UE support with CGA/light min A for safety/steadying  Goal status: IN PROGRESS  3.  Patient will increase FIST score by > 6 points to demonstrate decreased fall risk during functional activities Baseline:  43/56  09/23/2023: 53/56 Goal status: MET and UPGRADED  09/23/2023: Patient will increase Berg Balance score to > 45/56 to demonstrate improved balance and decreased fall risk during functional activities and ADLs.  Baseline: 09/23/2023: 14/56 7/31: 20/56 using RW support 11/26/2023: 17/56 without AD support Goal status: IN PROGRESS  4.  Patient will increase 10 meter walk test to >0.45m/s as to improve gait speed for better community ambulation and to reduce fall risk. Baseline: 09/09/2023: 0.103 m/s ( and 37 seconds) using bari-RW with skilled heavy min A and +2 w/c follow for safety  09/19/2023: 0.61m/s using bari-RW with skilled min A of 1 and +2 w/c follow for safety 10/22/2023: 0.1875 m/s using bari-RW with L hand splint, L swedish knee cage, and only skilled light min A of 1 (no wheelchair follow)  8/12: 0.162m/s with RW, and L hand splint. No knee cage on this day.  11/26/2023: Average Normal speed: 0.20 m/s & Average Fast speed: 0.275 m/s using bari-RW with CGA/min A for balance wearing [facility] 12/17/2023: Average Normal speed: 0.32 m/s using  bari-RW with CGA & Average Fast speed: 0.36 m/s using bari-RW requiring CGA/light min A  Goal status: IN PROGRESS  5.  Patient will reduce timed up and go to <11 seconds to reduce fall risk and demonstrate improved transfer/gait ability. Baseline: unable to complete turn at 10 ft. Mod-max assist due to poor control of LW on RW, resulting in L lateral LOB  09/09/2023: and 32 seconds using bari-RW with L hand splint and +2 on R side for safety, therapist providing skilled min A on L side (Had pt set-up L hand on RW splint, get L foot in proper positioning, and scoot forward in seat before starting the timer) 09/19/2023: 4min43 seconds using bari-RW with only skilled min A of 1 (Had pt set-up L hand on RW splint, get L foot in proper positioning, and scoot forward in seat before starting the timer) 7/2: 78sec(1:18.125) with RW and min assist overall.  10/22/2023: 1 min, 30 seconds using bari-RW with L hand splint and skilled light min assist  8/12: 1:01 min with RW and L hand splint.  11/26/2023: 51.57 seconds using bari-RW with hand splint, wearing SKC, and CGA-minA for balance Goal status: IN PROGRESS   ASSESSMENT:  CLINICAL IMPRESSION:    Patient arrives motivated to participate in therapy session. Therapy session focused on stair navigation training to work towards pt's ability to safely enter/exit their secondary home and family's homes. Patient demos  improving motor control of L LE during ascent requiring only light minA/CGA for steadying, and pt progressing in step-to patten during descent, but still requires skilled min A for placing L LE onto next step while maintaining balance. Patient continued to participate in high intensity gait training (HIGT) on treadmill achieving 1.14mph gait speed consistently for 42min30sec without manual facilitation, but then requires consistent skilled min A for L LE swing advancement to achieve goal of 3 minutes of continuous gait training.   Plan to continue high  intensity gait training on treadmill as well as progress to overground gait training in litegait harness without UE support to improve pt's motor plan of wt shifting onto stance limbs without compensation through R UE, to promote improved balance recovery strategies. Pt will continue to benefit from skilled physical therapy intervention to address impairments, improve QOL, and attain therapy goals.     OBJECTIVE IMPAIRMENTS: Abnormal gait, cardiopulmonary status limiting activity, decreased activity tolerance, decreased balance, decreased cognition, decreased coordination, decreased endurance, decreased knowledge of condition, decreased knowledge of use of DME, decreased mobility, difficulty walking, decreased ROM, decreased strength, decreased safety awareness, dizziness, hypomobility, increased fascial restrictions, impaired perceived functional ability, increased muscle spasms, impaired sensation, impaired tone, impaired UE functional use, impaired vision/preception, improper body mechanics, postural dysfunction, and obesity.   ACTIVITY LIMITATIONS: carrying, lifting, bending, sitting, standing, squatting, stairs, transfers, bed mobility, bathing, toileting, dressing, reach over head, hygiene/grooming, locomotion level, and caring for others  PARTICIPATION LIMITATIONS: meal prep, cleaning, laundry, medication management, interpersonal relationship, driving, shopping, community activity, and yard work  PERSONAL FACTORS: Age, Past/current experiences, Time since onset of injury/illness/exacerbation, and 3+ comorbidities: MG, HTN, CVA, lymphedema are also affecting patient's functional outcome.   REHAB POTENTIAL: Good  CLINICAL DECISION MAKING: Unstable/unpredictable  EVALUATION COMPLEXITY: High  PLAN:  PT FREQUENCY: 1-2x/week  PT DURATION: 8 weeks  PLANNED INTERVENTIONS: 97164- PT Re-evaluation, 97750- Physical Performance Testing, 97110-Therapeutic exercises, 97530- Therapeutic activity,  [id number]- Neuromuscular re-education, 97535- Self Care, [id number]- Manual therapy, [id number]- Gait training, [phone]- Orthotic Initial, [phone]- Orthotic/Prosthetic subsequent, [phone]- Canalith repositioning, [id number]- Electrical stimulation (unattended), [phone]- Electrical stimulation (manual), [id number]- Wound care (first 20 sq cm), 97598- Wound care (each additional 20 sq cm), Patient/Family education, Balance training, Stair training, Taping, Dry Needling, Joint mobilization, Joint manipulation, Vestibular training, Visual/preceptual remediation/compensation, Cognitive remediation, DME instructions, Wheelchair mobility training, Cryotherapy, and Moist heat  PLAN FOR NEXT SESSION:   Follow-up on addition of STS to HEP Follow-up on recommendation to attempt more functional gait in the home environment HIGT (high intensity gait training) using litegait harness either on treadmill vs overground Dynamic gait training using bari-RW vs litegait Backwards Side stepping Obstacle navigation (turning) Working on forward propulsion with increased L hip extension and forward progression of pelvis over L stance phase Dynamic standing balance of trunk rotation Continue monitoring SKC fit & see if posterior knee strap needs to be adjusted   Connell Kiss, PT, DPT, NCS, CSRS Physical Therapist - [place]  [facility]  3:11 PM 12/19/23

## 2023-12-20 NOTE — Therapy (Signed)
 Outpatient Occupational Therapy Neuro Treatment Note  Patient Name: [name]. MRN: [id number] DOB:[birth year], 73 y.o., male Today's Date: 12/20/2023  PCP: Alm Rower, MD REFERRING PROVIDER: Hermann Toribio PARAS, PA-C  END OF SESSION:  OT End of Session - 12/20/23 1950     Visit Number 35    Number of Visits 48    Date for Recertification  01/21/24    OT Start Time 1322    OT Stop Time 1400    OT Time Calculation (min) 38 min    Activity Tolerance Patient tolerated treatment well    Behavior During Therapy WFL for tasks assessed/performed         Past Medical History:  Diagnosis Date   Anemia    Cervical myelopathy (HCC)    Chronic venous insufficiency    [name] (chronic kidney disease), stage III (HCC)    Diabetes mellitus without complication (HCC)    GERD (gastroesophageal reflux disease)    Hypercholesteremia    Hypothyroidism    Kidney stones    about 50 in the past--last in March 4/24   Leg weakness, bilateral    due to Myasthenia Gravis   Myasthenia gravis (HCC)    Spinal stenosis    Vitamin B 12 deficiency    Vitamin B 12 deficiency    Wears hearing aid    bilateral   Past Surgical History:  Procedure Laterality Date   BACK SURGERY  09/2015   CARDIAC CATHETERIZATION     CATARACT EXTRACTION W/ INTRAOCULAR LENS IMPLANT     CATARACT EXTRACTION W/PHACO Right 12/21/2015   Procedure: CATARACT EXTRACTION PHACO AND INTRAOCULAR LENS PLACEMENT (IOC);  Surgeon: Dene Etienne, MD;  Location: [facility];  Service: Ophthalmology;  Laterality: Right;  DIABETIC - insulin  and oral meds   COLONOSCOPY     SHOULDER SURGERY     labrum repair   TONSILLECTOMY     Patient Active Problem List   Diagnosis Date Noted   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) 10/11/2023   High blood pressure 08/13/2023   Anemia 08/13/2023   Constipation 08/13/2023   Myasthenia gravis (HCC) 08/13/2023   Acute ischemic right  MCA stroke (HCC) 07/19/2023   Lymphedema 12/27/2016   Leg pain 07/15/2016   Chronic venous insufficiency 07/15/2016   Swelling of limb 07/15/2016   Type 2 diabetes mellitus (HCC) 07/15/2016   Hyperlipidemia 07/15/2016   ONSET DATE: 07/13/2023  REFERRING DIAG: Acute ischemic R MCA CVA  THERAPY DIAG: muscle weakness (generalized), other lack of coordination, vision disturbance, hemiplegia and hemiparesis following cerebral infarction affecting left non-dominant side (HCC)  Rationale for Evaluation and Treatment: Rehabilitation  SUBJECTIVE:  SUBJECTIVE STATEMENT: Pt and spouse report that they would like to set up a call with a [employer] nurse Navigator.  OT notified [employer] reps to set up phone call. Pt accompanied by: [name]  PERTINENT HISTORY: Pt. Was admitted to [facility] on 07/13/2023 after sustaining a CVA while on a trip to the beach. Pt. Was diagnosed with R MCA CVA. Pt. Was admitted to inpatient rehab from 07/19/2023-08/13/2023. Past medical history includes high BP, anemia, and Myasthenia Gravis.   PRECAUTIONS: None  WEIGHT BEARING RESTRICTIONS: No  PAIN: 12/19/23: No pain  FALLS: Has patient fallen in last 6 months? Yes  LIVING ENVIRONMENT: Lives with: lives with their family and lives with their spouse-  Lives in: House/apartment- 2 story home and resides mostly on the 1st floor Stairs: No- Ramped entrance Has following equipment  at home:  Vannie, cane, Steadi, shower chair, handheld shower head, bedside commode  PLOF: Independent and Independent with basic ADLs Independent at home   PATIENT GOALS: Would like to walk 10-20 feet each time.   OBJECTIVE:  Note: Objective measures were completed at Evaluation unless otherwise noted.  HAND DOMINANCE: Right  ADLs: Overall ADLs:  Transfers/ambulation related to ADLs: Eating: Independent, 100% R handed Grooming: independent UB Dressing: Can put on shirt independently LB Dressing: Difficulty with pants and requires  assistance getting the pants on his feet, has difficulty putting on shoes and socks Toileting: Tot A for toilet hygiene. Uses Steadi during toilet transfers. Bathing: Requires assist with using the L arm to wash the R arm. Tub Shower transfers: Roll in shower, built in shower chair, grab bars and hand held shower head.  IADLs: Shopping: Total A (Wife typically did the shopping prior to onset)   Light housekeeping: Total A (wife typically performs prior to onset) Meal Prep: Independent with light snack prep Community mobility: No driving, able to get in/out of the car Medication management: Set up assistance from his wife using a pill box (pt. Is responsible for taking medications at the correct time) Financial management: family members check behind him. Handwriting: TBD Hobbies: gardening, wood working and sports- [employer] Work history: Owns a tax business  MOBILITY STATUS: Needs Assist:    POSTURE COMMENTS:   Sitting balance: Good supported sitting balance  FUNCTIONAL OUTCOME MEASURES:  Fugl-Meyer: see below  UPPER EXTREMITY ROM:    Active ROM Right Eval  [facility] Right 09/23/2023 [facility] Left eval Left 09/23/2023 Left 10/29/23 Left 12/02/23  Shoulder flexion   89(110) 109(114) 111(115) 90 before scaption (110)  Shoulder abduction   98(120) 109(120) 111(122) 100 (110)  Shoulder adduction        Shoulder extension        Shoulder internal rotation        Shoulder external rotation        Elbow flexion   120(140) 130(136) 141(145)   Elbow extension   -28(-10) -5(0) -18(0)   Wrist flexion   60(60) Rest at 60 50(65) 54(60)   Wrist extension   -48(42) 10(40) 20(50) 30 (45)  Wrist ulnar deviation     12(22)   Wrist radial deviation     10(20)   Wrist pronation     (82)90   Wrist supination     64(74) 70 (75)  (Blank rows = not tested)  Eval: L Digit flexion: 2nd:3cm (0cm) 3rd: 0cm (0cm), 4th: 0cm (0cm), 5th: 2cm (0cm)  09/23/23:  L Digit flexion to [facility]: 2nd: 3.5cm (0cm), 3rd: 4  cm(0cm), 4th 3.5cm (0cm), 5th: 2cm (0cm)  Eval:  L Digit extension: Active Gross digit extension through 10% of ROM  09/23/23:  L Digit extension: Active Gross digit extension through 50% of ROM  10/29/23:  L Digit extension: Active Gross digit extension through 25-50% of ROM  12/02/23:  L digit extension  Active gross ext through 30%   UPPER EXTREMITY MMT:     MMT Right eval Left eval Left 09/23/2023 Left 10/29/23 Left 12/02/23  Shoulder flexion 5 3- 3-/5 3-/5   Shoulder abduction 5 3- 3-/5 3-/5   Shoulder adduction       Shoulder extension       Shoulder internal rotation       Shoulder external rotation       Middle trapezius       Lower trapezius  Elbow flexion 5 TBD 3+/5 4-/5 4+/5  Elbow extension 5 TBD 4-/5 3-/5 4+/5  Wrist flexion   2/5 3-/5 3-  Wrist extension  2- 2/5 2+/5 3-  Wrist ulnar deviation       Wrist radial deviation       Wrist pronation    3/5 3+  Wrist supination    3-/5 3-  (Blank rows = not tested)  HAND FUNCTION:  Eval:  Grip strength: Right: 94#, Left: 1#,  Lateral Key Pinch strength: Right: 14#, Left: 3#  10/29/23:  Grip strength: Left: 16# Lateral Key Pinch strength: Left: 5#  12/02/23: L: 5 lbs, R: 98 # Lateral pinch: Left: 5 lbs   COORDINATION: Box and Blocks: Right: 34 blocks completed in 1 min., Left: 0 blocks completed in 1 min.   10/29/23:  Box and Blocks: Right: 34 blocks completed in 1 min., Left: 2 blocks completed in 1 min.  9 Hole Peg Test: Left: To remove 9 pegs from a vertical position: 1 min. &13 sec.  12/02/23: Box and Blocks: 4 blocks 12/02/23: 4 pegs out in 3 min (multiple pulled and dropped out of the cup)   FUGL-MEYER ASSESSMENT   A. Upper Extremity   I. Reflex Activity:   12/12/23   Flexors (biceps) 2   Extensors (triceps) 2   Total 4/4                 II. Volitional Movement within Synergies:   12/12/23   Flexor Synergy:      Shoulder Elevation 2   Shoulder Retraction 2   Shoulder Abduction (at  least 90*) 2   External Rotation 1   Elbow Flexion 2   Forearm Supination 1   Extensor Synergy:      Shoulder Adduction/Internal Rotation 2   Elbow Extension 1   Forearm Pronation 2   Total 15/18     III. Movement Combining Synergies:   12/12/23   Hand to Lumbar Spine 1   Shoulder Flexion at 90*, elbow at 0* 0   Pron/Sup w/elbow at 90* and shoulder at 0* 1   Total 2/6     IV. Movement Out of Synergy   12/12/23   Shoulder Abduction to 90*, elbow at 0*, forearm pronated 0   Shoulder Flexion 9)-180*, elbow at 0*, forearm in mid position 0   Pron/sup, elbow at 0* and shoulder between 30-90* of flexion 0   Total 0/6       B. Wrist   12/12/23   Stability of wrist at 15* ext, elbow at 90*, shoulder 0* 2   Repeated flex/ext, elbow at 90*, shoulder 0* 1   Stability of wrist at 15* ext, elbow at 0*, shoulder 30* 0   Flex/ext, elbow 0*, shoulder 30* 0   Circumduction  1   Total 4/10     C. Hand   12/12/23   Mass Flexion 1   Mass Extension 1   Total 2/4       D. Grasp   12/12/23   Hook  0   Thumb Adduction - paper 1   Pincer - pen 0   Cylindrical - cup/can 0   Spherical - tennis ball 1   Total 2/10     E. Coordination/Speed   12/12/23   Tremor 2   Dysmetria 0   Time 1   Total 3/6     FUGL-MEYER ASSESSMENT: UPPER EXTREMITY A-E TOTAL on (date): 32/66   SENSATION: Sensation feels different in both  hands.  L hand Light touch- impaired L hand Proprioception- impaired  EDEMA: Has had hx of edema, and came to Eval session with swelling in the L hand/wrist/forearm.   *pt. has a laceration from his dog on the dorsal aspect of the L hand.   MUSCLE TONE: Increased tone through the UE and hand flexors.   COGNITION: Overall cognitive status: Within functional limits for tasks assessed  VISION: Subjective report: L sided inattention Baseline vision: Wears glasses for reading only Visual history: Right visual occlusion  VISION ASSESSMENT: To be assessed  PERCEPTION:  TBD  PRAXIS: Impaired                                                                                                                 TREATMENT DATE: 12/19/2023 Neuro re-ed: -LUE grasp/release of large wooden pegs with placement into and removal from pegboard.  Mod vc for motor sequencing.  -LUE supination with visual feedback of rotating a tool in hand to maximize end range  Therapeutic Exercise: -AAROM for L thumb palmar abd, radial abd, and IP ext  -AAROM for L hand digit abd/add, manual assist from OT for flat palm to maintaining MP ext -AAROM for L hand digit PIP ext from closed and semi closed fist   Education: Education details: Neuro re-ed to LUE Person educated: Patient and spouse Education method: Leisure centre manager cues Education comprehension: verbalized understanding, further training needed  HOME EXERCISE PROGRAM: -grasping and stacking 1, and 1/2 cubes form the tabletop surface.  GOALS: Goals reviewed with patient? Yes  SHORT TERM GOALS: Target date: 10/01/23  Pt. Will be independent with HEP for the LUE.  Baseline: 12/03/23: indep with current HEP, though ongoing progressions are being made as UE function improves; 10/29/23: Pt. requires assist from wife with HEPs. Pt. Is consistently working on HEP's at home. 09/19/23: Requires assist from his wife Eval; no current HEP Goal status: Ongoing  LONG TERM GOALS: Target date: 01/21/2024  Pt. Will increase L shoulder flexion by 10 degrees to be able to reach up to shelves.  Baseline: 12/03/23: 90* before onset of scaption; 10/29/23: 111(115) 09/19/23: Pt. Continues to be limited with reaching up to shelves. Eval: L shoulder flexion is 89 (110). Goal status: Ongoing  2.  Pt. Will increase L shoulder abduction by 10 degrees to assist with underarm hygiene.  Baseline: 12/03/23: 100*; pt able to demo bilat underarm hygiene with set up; 10/29/23: 888(877) 09/19/23: Pt. Continues to require assist with underarm hygiene.Eval: L  shoulder abduction is 98 (120).  Goal status: ongoing  3.  Pt. Will improve L wrist extension by 10 degrees to be able to initiate anticipation of grasping for objects.  Baseline: 12/03/23: 30* (45); 10/29/23: 20(55) 09/19/23: Consistent activation noted in the left wrist extensors with facilitation. Eval: -48 (42) Goal status: Ongoing  4.  Pt. Will improve active gross digit extension through 50% of the range to be able to release objects in his hand consistently.  Baseline: 12/03/23: Gross digit ext through ~30% of  range (limited by flexor tone in the PIPs); 10/28/20: Gross digit extension through 25-50% of the range. 09/19/23: gross digit extension noted through 25% range with facilitation.Eval: gross digit extension is 10% of the range  Goal status: Goal updated to 50% of the range  5.  Pt. Will independently demonstrate visual compensatory strategies when navigating through environment and during tabletop tasks. Baseline: 12/03/23: Pt demonstrates consistent head turns to scan to L visual field without cueing; 10/29/2023: Pt. Continues to less cuing for left sided awareness. 09/19/23: Pt. requires fewer cues for left sided awareness Eval: Pt. Requires consistent cuing for L sided awareness.  Goal status: achieved   6. Pt. Will improve left hand [facility] skills to be able to manipulate small objects during ADLs, and IADLs. Baseline: 12/03/23: Pt removed 3 pegs in >3 min, pulling out more than 3 but repeatedly dropping to floor or table top rather than assessment dish; 10/29/23: 9 Hole Peg Test: Pt. Was able to remove 9 pegs from vertical position on the pegboard in 1 min. & 13 sec. Pt. Is not yet able to pick up pegs from a horizontal position to place them in the pegboard.   Goal status: ongoing   7. Pt will improve left hand function skills as evidenced by improved score to 4 blocks on the Box and Blocks test. Baseline: 12/03/23: 4 blocks after 3 trials and passive wrist and digit stretching between trials (not  yet consistent to achieve 4 blocks); 10/29/23: Box and Blocks Test: 2 Blocks completed in 1 min.   Goal Status: ongoing   ASSESSMENT:  CLINICAL IMPRESSION:  Continued focus on LUE grasp/release task practice using large wooden pegs.  Increased challenge at the shoulder and wrist with pegboard propped on incline wedge.  Pt was able to remove 3 of 9 pegs with motor sequencing cues only, and place pegs with mod A and mod vc for motor sequencing.  Pt demonstrated improved grasp of pegs when given motor sequencing cues to engage thumb and position peg toward radial side of palm rather than ulnar side.  Pt continues to present with moderate flexor tone in the wrist and hand, primarily limiting PIP and thumb IP ext.  Pt continues to benefit from OT services to work on improving LUE functioning to increase engagement of the LUE during daily tasks, and to provide education about compensatory strategies during ADLs/IADLs.  PERFORMANCE DEFICITS: in functional skills including ADLs, IADLs, coordination, dexterity, proprioception, sensation, edema, tone, ROM, strength, pain, Fine motor control, Gross motor control, mobility, balance, endurance, vision, and UE functional use, cognitive skills including perception, and psychosocial skills including coping strategies, environmental adaptation, and routines and behaviors.   IMPAIRMENTS: are limiting patient from ADLs, IADLs, and leisure.   CO-MORBIDITIES: may have co-morbidities  that affects occupational performance. Patient will benefit from skilled OT to address above impairments and improve overall function.  MODIFICATION OR ASSISTANCE TO COMPLETE EVALUATION: Min-Moderate modification of tasks or assist with assess necessary to complete an evaluation.  OT OCCUPATIONAL PROFILE AND HISTORY: Detailed assessment: Review of records and additional review of physical, cognitive, psychosocial history related to current functional performance.  CLINICAL DECISION MAKING:  Moderate - several treatment options, min-mod task modification necessary  REHAB POTENTIAL: Good  EVALUATION COMPLEXITY: Moderate  PLAN:  OT FREQUENCY: 2x/week  OT DURATION: 12 weeks  PLANNED INTERVENTIONS: 97168 OT Re-evaluation, 97535 self care/ADL training, 02889 therapeutic exercise, 97530 therapeutic activity, 97112 neuromuscular re-education, 97140 manual therapy, 97018 paraffin, 02989 moist heat, 97010 cryotherapy, 97034 contrast bath,  02239 Orthotic Initial, 02236 Orthotic/Prosthetic subsequent, passive range of motion, visual/perceptual remediation/compensation, energy conservation, and DME and/or AE instructions  RECOMMENDED OTHER SERVICES: PT and ST  CONSULTED AND AGREED WITH PLAN OF CARE: Patient and family member/caregiver  PLAN FOR NEXT SESSION: see above  Inocente Blazing, MS, OTR/L

## 2023-12-24 ENCOUNTER — Encounter: Admitting: Speech Pathology

## 2023-12-24 ENCOUNTER — Ambulatory Visit: Admitting: Occupational Therapy

## 2023-12-24 ENCOUNTER — Ambulatory Visit: Admitting: Physical Therapy

## 2023-12-24 DIAGNOSIS — R278 Other lack of coordination: Secondary | ICD-10-CM

## 2023-12-24 DIAGNOSIS — R269 Unspecified abnormalities of gait and mobility: Secondary | ICD-10-CM

## 2023-12-24 DIAGNOSIS — M6281 Muscle weakness (generalized): Secondary | ICD-10-CM

## 2023-12-24 DIAGNOSIS — R262 Difficulty in walking, not elsewhere classified: Secondary | ICD-10-CM

## 2023-12-24 DIAGNOSIS — I69354 Hemiplegia and hemiparesis following cerebral infarction affecting left non-dominant side: Secondary | ICD-10-CM

## 2023-12-24 DIAGNOSIS — R2681 Unsteadiness on feet: Secondary | ICD-10-CM

## 2023-12-24 NOTE — Therapy (Signed)
 OUTPATIENT PHYSICAL THERAPY TREATMENT  Patient Name: [name]. MRN: [id number] DOB:[birth year], 73 y.o., male Today's Date: 12/24/2023 PCP: Dennise Remak, MD  REFERRING PROVIDER:  Pegge Toribio PARAS, PA-C  END OF SESSION:    PT End of Session - 12/24/23 1529     Visit Number 38    Number of Visits 40    Date for Recertification  12/31/23    Authorization Type [employer] Medicare    Authorization Time Period [facility] auth#: [id number] for 6 PT vst from 9/9-10/07 (9/30 is 6 of 6)    Progress Note Due on Visit 40    PT Start Time 1530    PT Stop Time 1615    PT Time Calculation (min) 45 min    Equipment Utilized During Treatment Gait belt    Activity Tolerance Patient tolerated treatment well;No increased pain    Behavior During Therapy WFL for tasks assessed/performed            Past Medical History:  Diagnosis Date   Anemia    Cervical myelopathy (HCC)    Chronic venous insufficiency    [name] (chronic kidney disease), stage III (HCC)    Diabetes mellitus without complication (HCC)    GERD (gastroesophageal reflux disease)    Hypercholesteremia    Hypothyroidism    Kidney stones    about 50 in the past--last in March 4/24   Leg weakness, bilateral    due to Myasthenia Gravis   Myasthenia gravis (HCC)    Spinal stenosis    Vitamin B 12 deficiency    Vitamin B 12 deficiency    Wears hearing aid    bilateral   Past Surgical History:  Procedure Laterality Date   BACK SURGERY  09/2015   CARDIAC CATHETERIZATION     CATARACT EXTRACTION W/ INTRAOCULAR LENS IMPLANT     CATARACT EXTRACTION W/PHACO Right 12/21/2015   Procedure: CATARACT EXTRACTION PHACO AND INTRAOCULAR LENS PLACEMENT (IOC);  Surgeon: Dene Etienne, MD;  Location: [facility];  Service: Ophthalmology;  Laterality: Right;  DIABETIC - insulin  and oral meds   COLONOSCOPY     SHOULDER SURGERY     labrum repair   TONSILLECTOMY     Patient Active Problem List   Diagnosis Date Noted    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) 10/11/2023   High blood pressure 08/13/2023   Anemia 08/13/2023   Constipation 08/13/2023   Myasthenia gravis (HCC) 08/13/2023   Acute ischemic right MCA stroke (HCC) 07/19/2023   Lymphedema 12/27/2016   Leg pain 07/15/2016   Chronic venous insufficiency 07/15/2016   Swelling of limb 07/15/2016   Type 2 diabetes mellitus (HCC) 07/15/2016   Hyperlipidemia 07/15/2016    ONSET DATE: 07/19/23  REFERRING DIAG:  P36.488 (ICD-10-CM) - Cerebral infarction due to unspecified occlusion or stenosis of right middle cerebral artery   THERAPY DIAG:  Muscle weakness (generalized)  Other lack of coordination  Hemiplegia and hemiparesis following cerebral infarction affecting left non-dominant side (HCC)  Difficulty in walking, not elsewhere classified  Unsteadiness on feet  Abnormality of gait and mobility  Rationale for Evaluation and Treatment: Rehabilitation  SUBJECTIVE:  SUBJECTIVE STATEMENT:   Pt and wife report they are not allowed to write an appeal to their insurance until patient has been fully denied any additional therapy visits; even though he only received approval for 2 more visits after current authorization. Pt states he did not do repeated sit<>stands, but will add that to his exercise program. Pt's wife reports pt walked down and back up their ramp at home with 4 people present for patient safety using his bari-RW and pt also able to walk into the house using his bari-RW afterwards. Pt was being provided CGA for safety by his son-in-law. Pt reports his goal is to do that again when the weather permits.   Denies falls/stumbles and pt reporting increasing confidence/balance with turns at home. Denies  pain.   PERTINENT HISTORY:  CVA with Left hemiplegia in April 2025. Pt started on a swedish knee cage at CIR due to uncontrolled hyperextension in stance. Pt had not been moving much since discharging from CIR, as therapists instructed pt to perform transfers only at home due to high fall risk with ambulation. Continues to have significant weakenss in the LUE with difficulty extending fingers as well as numbness and inattention to the L side of body resulting in multiple cuts on Arm and leg with WC mobility in home. Will be attaining custom power WC from [employer]. Has loaner currently.   PAIN:  Are you having pain? No  PRECAUTIONS: Fall, history of wounds on LLE   *Latex allergy   WEIGHT BEARING RESTRICTIONS: No  FALLS: Has patient fallen in last 6 months? Yes. Number of falls 1  LIVING ENVIRONMENT: Lives with: lives with their spouse Lives in: House/apartment Stairs: Ramps installed while in the Hospital  Has following equipment at home: Vannie - 2 wheeled and Wheelchair (power)  PLOF: Independent with basic ADLs, Independent with household mobility without device, and Independent with community mobility with device with [facility]   PATIENT GOALS: relearn to Walk 15-15ft.   OBJECTIVE:                                                                                                                              TREATMENT DATE: 12/24/2023  Pt arrives in transport chair.   Donned pt's L LE SKC to use during session. Noticed the strap around his lower leg is more tight today due to increased edema - educated pt and wife to continue the recommended edema management strategies from the wound care clinic.   Stair navigation training ascending/descending 8 steps (6 height) using B HRs with skilled min A primarily during descent as follows: Unable to keep L hand grasp on railing during descent; therefore, only using R UE support Step-to pattern leading with R LE on ascent - pt does well advancing  L LE without his L toes catching on the lip of the stair Only requires light min A for steadying during ascent! Step-to pattern leading with L LE on descent Requires  skilled lighter min A primarily for L LE management onto next step during descent, and light min A for balance L knee goes into full extension when accepting weight, causing minor posterior lean - requiring light min A to facilitate anterior weight shift onto L foot for improved balance; however, this is improving Continued cuing to maintain forward step-to pattern on descent, rather than attempting sideways stepping because pt has more difficulty performing a lateral step  Gait over to treadmill using bari-RW with CGA - therapist able to anticipate direction of LOB and cuing pt for improved sequencing of L LE stepping, especially when turning. Pt aware that occasionally he will not lift L LE adequately to clear his foot from the floor.  Pt able to step on/off treadmill in litegait harness for balance support, using B UE support on handrails. Therapist providing manual facilitation for L LE posterior stepping off treadmill.   HR: 107bpm to start  Goal HR to be at 80% HR max during high intensity gait training: 127bpm    Gait training the following trials on the treadmill in litegait harness providing balance support, but not true BWS: B UE support on litegait handles throughout, requires ACE wrap around L hand to maintain grasp on handle 30min0sec, 0.32mph to 1.45mph in 15 seconds, increased to 1.2 at 1 min 40 sec., totaling 263ft Able to advance L LE independently, even at increased speeds! Until becoming fatigued over final 30 seconds requiring frequent skilled min A to clear L foot fully and continue to achieve reciprocal pattern HR 147bpm, Borg RPE 11/20 Standing rest break HR recovers to 127bpm 54min30sec at 1.0 mph increased to 1. at 1 min, totaling 228ft HR 148bpm, Borg RPE: 14/20 Able to make it 32min15sec with only 3x  skilled manual facilitation to advance L LE during swing, then for final 15sec requires consistent manual facilitation to clear and advance L LE during swing Achieves consistent reciprocal pattern!! Seated rest break, HR recovers to 106-108bpm  Stepped off treadmill in litegait harness and doffed harness  Educated pt on recommendation to continue participating in more purposeful ambulation in the home using bari-RW to navigate between rooms with safe set-up and having his adult children present for additional balance support.    PATIENT EDUCATION: Education details: knee cage needs assessmnet as it is causeing crossover gait.  Person educated: Patient and Spouse Education method: Explanation, VC/TC/Demo Education comprehension: verbalized understanding  HOME EXERCISE PROGRAM: Access Code: [email or web address] URL: [email or web address] Date: 09/11/2023 Prepared by: Massie Dollar  Exercises - Seated Knee Extension AROM  - 1 x daily - 4 x weekly - 3 sets - 10 reps - Seated Hip Abduction  - 1 x daily - 4 x weekly - 3 sets - 10 reps - Seated March  - 1 x daily - 4 x weekly - 3 sets - 10 reps - Sit to Stand with Counter Support  - 1 x daily - 4 x weekly - 3 sets - 10 reps - Supine Short Arc Quad  - 1 x daily - 4 x weekly - 3 sets - 10 reps - Supine Hip Abduction  - 1 x daily - 4 x weekly - 3 sets - 10 reps - Supine Bridge  - 1 x daily - 4 x weekly - 3 sets - 3 reps - 2sec  hold  12/17/2023: verbal instruction to complete repeated sit<>stands with education on safe set-up provided  GOALS: Goals reviewed with patient? Yes  SHORT TERM GOALS: Target date: 12/12/2023  Patient  will be independent in home exercise program to improve strength/mobility for better functional independence with ADLs. Baseline: Initiated on 09/11/2023 10/22/2023: will update when appropriate 11/26/2023: patient participating in transfer and gait practice in controlled environment with 2 person assistance at home  in addition to HEP Goal status: IN PROGRESS  LONG TERM GOALS: Target date: 12/31/2023  Patient will increase SIS-16 score to equal to or greater than 10points to demonstrate statistically significant improvement in mobility and quality of life.  Baseline: 49/80 09/19/2023: 45/80 10/22/2023: 46/80 11/26/2023: 47/80 Goal status: IN PROGRESS  2.  Patient (> 43 years old) will complete five times sit to stand test in < 15 seconds indicating an increased LE strength and improved balance. Baseline: 1:43min 09/19/2023: 56.39 seconds using R UE support to push-up from armrest of green chair and requiring skilled min A for balance and lifting/lowering  10/22/2023: 27.10 seconds using R UE support to push-up from armrest  8/12: 27.7 sec with RUE pushing from arm rest.  11/26/2023: 37.42 seconds using R UE support to push-up from armrest of green chair and not using L UE support with min-modA for facilitating anterior weight shift and lifting to come to stand secondary to fatigue (would benefit from re-testing at next session) 12/17/2023: 24.59 seconds using R UE support to push-up from armrest of green chair and not using L UE support with CGA/light min A for safety/steadying  Goal status: IN PROGRESS  3.  Patient will increase FIST score by > 6 points to demonstrate decreased fall risk during functional activities Baseline:  43/56  09/23/2023: 53/56 Goal status: MET and UPGRADED  09/23/2023: Patient will increase Berg Balance score to > 45/56 to demonstrate improved balance and decreased fall risk during functional activities and ADLs.  Baseline: 09/23/2023: 14/56 7/31: 20/56 using RW support 11/26/2023: 17/56 without AD support Goal status: IN PROGRESS  4.  Patient will increase 10 meter walk test to >0.84m/s as to improve gait speed for better community ambulation and to reduce fall risk. Baseline: 09/09/2023: 0.103 m/s ( and 37 seconds) using bari-RW with skilled heavy min A and +2 w/c follow for safety   09/19/2023: 0.55m/s using bari-RW with skilled min A of 1 and +2 w/c follow for safety 10/22/2023: 0.1875 m/s using bari-RW with L hand splint, L swedish knee cage, and only skilled light min A of 1 (no wheelchair follow)  8/12: 0.16m/s with RW, and L hand splint. No knee cage on this day.  11/26/2023: Average Normal speed: 0.20 m/s & Average Fast speed: 0.275 m/s using bari-RW with CGA/min A for balance wearing [facility] 12/17/2023: Average Normal speed: 0.32 m/s using bari-RW with CGA & Average Fast speed: 0.36 m/s using bari-RW requiring CGA/light min A  Goal status: IN PROGRESS  5.  Patient will reduce timed up and go to <11 seconds to reduce fall risk and demonstrate improved transfer/gait ability. Baseline: unable to complete turn at 10 ft. Mod-max assist due to poor control of LW on RW, resulting in L lateral LOB  09/09/2023: and 32 seconds using bari-RW with L hand splint and +2 on R side for safety, therapist providing skilled min A on L side (Had pt set-up L hand on RW splint, get L foot in proper positioning, and scoot forward in seat before starting the timer) 09/19/2023: 20min43 seconds using bari-RW with only skilled min A of 1 (Had pt set-up L hand on RW splint, get L foot in proper positioning, and scoot forward in seat before starting the  timer) 7/2: 78sec(1:18.125) with RW and min assist overall.  10/22/2023: 1 min, 30 seconds using bari-RW with L hand splint and skilled light min assist  8/12: 1:01 min with RW and L hand splint.  11/26/2023: 51.57 seconds using bari-RW with hand splint, wearing SKC, and CGA-minA for balance Goal status: IN PROGRESS   ASSESSMENT:  CLINICAL IMPRESSION:    Patient arrives motivated to participate in therapy session. Therapy session focused on stair navigation training to work towards pt's ability to safely enter/exit their secondary home and family's homes. Patient demos improving motor control of L LE during ascent requiring only light minA/CGA for  steadying balance, and pt progressing in step-to patten during descent, only requiring skilled light min A for placing L LE onto next step while maintaining balance! Patient continued to participate in high intensity gait training (HIGT) on treadmill increasing to 1. gait speed consistently for 53min30sec and overall able to ambulate almost 3 full minutes without manual facilitation due to improving L LE motor control and strength as well as endurance, but with fatigue requires skilled min A for L LE swing advancement. Plan to continue high intensity gait training on treadmill as well as progress to overground gait training in litegait harness without UE support to improve pt's motor plan of wt shifting onto stance limbs without compensation through R UE, to promote improved balance recovery strategies. Patient remains highly motivated to reach a functional ambulatory level, at least at a household level, and demonstrates great progress towards that goal! Pt will continue to benefit from skilled physical therapy intervention to address impairments, improve QOL, and attain therapy goals. Patient's condition has the potential to improve in response to therapy. Maximum improvement is yet to be obtained. The anticipated improvement is attainable and reasonable in a generally predictable time.      OBJECTIVE IMPAIRMENTS: Abnormal gait, cardiopulmonary status limiting activity, decreased activity tolerance, decreased balance, decreased cognition, decreased coordination, decreased endurance, decreased knowledge of condition, decreased knowledge of use of DME, decreased mobility, difficulty walking, decreased ROM, decreased strength, decreased safety awareness, dizziness, hypomobility, increased fascial restrictions, impaired perceived functional ability, increased muscle spasms, impaired sensation, impaired tone, impaired UE functional use, impaired vision/preception, improper body mechanics, postural dysfunction,  and obesity.   ACTIVITY LIMITATIONS: carrying, lifting, bending, sitting, standing, squatting, stairs, transfers, bed mobility, bathing, toileting, dressing, reach over head, hygiene/grooming, locomotion level, and caring for others  PARTICIPATION LIMITATIONS: meal prep, cleaning, laundry, medication management, interpersonal relationship, driving, shopping, community activity, and yard work  PERSONAL FACTORS: Age, Past/current experiences, Time since onset of injury/illness/exacerbation, and 3+ comorbidities: MG, HTN, CVA, lymphedema are also affecting patient's functional outcome.   REHAB POTENTIAL: Good  CLINICAL DECISION MAKING: Unstable/unpredictable  EVALUATION COMPLEXITY: High  PLAN:  PT FREQUENCY: 1-2x/week  PT DURATION: 8 weeks  PLANNED INTERVENTIONS: 97164- PT Re-evaluation, 97750- Physical Performance Testing, 97110-Therapeutic exercises, 97530- Therapeutic activity, [id number]- Neuromuscular re-education, 97535- Self Care, [id number]- Manual therapy, [id number]- Gait training, [id number]- Orthotic Initial, [id number]- Orthotic/Prosthetic subsequent, [phone]- Canalith repositioning, [id number]- Electrical stimulation (unattended), [phone]- Electrical stimulation (manual), [id number]- Wound care (first 20 sq cm), 97598- Wound care (each additional 20 sq cm), Patient/Family education, Balance training, Stair training, Taping, Dry Needling, Joint mobilization, Joint manipulation, Vestibular training, Visual/preceptual remediation/compensation, Cognitive remediation, DME instructions, Wheelchair mobility training, Cryotherapy, and Moist heat  PLAN FOR NEXT SESSION:   Follow-up on addition of STS to HEP Continue stair navigation training HIGT (high intensity gait training) using litegait harness either on treadmill vs overground Dynamic gait training using  bari-RW vs litegait Backwards Side stepping Obstacle navigation (turning) Working on forward propulsion with increased L hip extension and forward progression of  pelvis over L stance phase Dynamic standing balance of trunk rotation Continue monitoring SKC fit & see if posterior knee strap needs to be adjusted   Connell Kiss, PT, DPT, NCS, CSRS Physical Therapist - [place]  [facility]  4:32 PM 12/24/23

## 2023-12-24 NOTE — Therapy (Signed)
 Outpatient Occupational Therapy Neuro Treatment Note  Patient Name: [name]. MRN: [id number] DOB:Oct 20, 1950, 73 y.o., male Today's Date: 12/24/2023  PCP: Alm Rower, MD REFERRING PROVIDER: Hermann Toribio PARAS, PA-C  END OF SESSION:  OT End of Session - 12/24/23 1950     Visit Number 36    Number of Visits 48    Date for Recertification  01/21/24    OT Start Time 1445    OT Stop Time 1529    OT Time Calculation (min) 44 min    Activity Tolerance Patient tolerated treatment well    Behavior During Therapy [facility] for tasks assessed/performed         Past Medical History:  Diagnosis Date   Anemia    Cervical myelopathy (HCC)    Chronic venous insufficiency    [name] (chronic kidney disease), stage III (HCC)    Diabetes mellitus without complication (HCC)    GERD (gastroesophageal reflux disease)    Hypercholesteremia    Hypothyroidism    Kidney stones    about 50 in the past--last in March 4/24   Leg weakness, bilateral    due to Myasthenia Gravis   Myasthenia gravis (HCC)    Spinal stenosis    Vitamin B 12 deficiency    Vitamin B 12 deficiency    Wears hearing aid    bilateral   Past Surgical History:  Procedure Laterality Date   BACK SURGERY  09/2015   CARDIAC CATHETERIZATION     CATARACT EXTRACTION W/ INTRAOCULAR LENS IMPLANT     CATARACT EXTRACTION W/PHACO Right 12/21/2015   Procedure: CATARACT EXTRACTION PHACO AND INTRAOCULAR LENS PLACEMENT (IOC);  Surgeon: Dene Etienne, MD;  Location: [facility];  Service: Ophthalmology;  Laterality: Right;  DIABETIC - insulin  and oral meds   COLONOSCOPY     SHOULDER SURGERY     labrum repair   TONSILLECTOMY     Patient Active Problem List   Diagnosis Date Noted   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) 10/11/2023   High blood pressure 08/13/2023   Anemia 08/13/2023   Constipation 08/13/2023   Myasthenia gravis (HCC) 08/13/2023   Acute ischemic right  MCA stroke (HCC) 07/19/2023   Lymphedema 12/27/2016   Leg pain 07/15/2016   Chronic venous insufficiency 07/15/2016   Swelling of limb 07/15/2016   Type 2 diabetes mellitus (HCC) 07/15/2016   Hyperlipidemia 07/15/2016   ONSET DATE: 07/13/2023  REFERRING DIAG: Acute ischemic R MCA CVA  THERAPY DIAG: muscle weakness (generalized), other lack of coordination, vision disturbance, hemiplegia and hemiparesis following cerebral infarction affecting left non-dominant side (HCC)  Rationale for Evaluation and Treatment: Rehabilitation  SUBJECTIVE:  SUBJECTIVE STATEMENT: Pt reports he is doing well, feels a little uncomfortable when challenged to weight shift to the left side. Pt accompanied by: [name]  PERTINENT HISTORY: Pt. Was admitted to [facility] on 07/13/2023 after sustaining a CVA while on a trip to the beach. Pt. Was diagnosed with R MCA CVA. Pt. Was admitted to inpatient rehab from 07/19/2023-08/13/2023. Past medical history includes high BP, anemia, and Myasthenia Gravis.   PRECAUTIONS: None  WEIGHT BEARING RESTRICTIONS: No  PAIN: 12/19/23: No pain  FALLS: Has patient fallen in last 6 months? Yes  LIVING ENVIRONMENT: Lives with: lives with their family and lives with their spouse-  Lives in: House/apartment- 2 story home and resides mostly on the 1st floor Stairs: No- Ramped entrance Has following equipment at home:  Vannie, cane, Steadi, shower chair, handheld  shower head, bedside commode  PLOF: Independent and Independent with basic ADLs Independent at home   PATIENT GOALS: Would like to walk 10-20 feet each time.   OBJECTIVE:  Note: Objective measures were completed at Evaluation unless otherwise noted.  HAND DOMINANCE: Right  ADLs: Overall ADLs:  Transfers/ambulation related to ADLs: Eating: Independent, 100% R handed Grooming: independent UB Dressing: Can put on shirt independently LB Dressing: Difficulty with pants and requires assistance getting the pants on  his feet, has difficulty putting on shoes and socks Toileting: Tot A for toilet hygiene. Uses Steadi during toilet transfers. Bathing: Requires assist with using the L arm to wash the R arm. Tub Shower transfers: Roll in shower, built in shower chair, grab bars and hand held shower head.  IADLs: Shopping: Total A (Wife typically did the shopping prior to onset)   Light housekeeping: Total A (wife typically performs prior to onset) Meal Prep: Independent with light snack prep Community mobility: No driving, able to get in/out of the car Medication management: Set up assistance from his wife using a pill box (pt. Is responsible for taking medications at the correct time) Financial management: family members check behind him. Handwriting: TBD Hobbies: gardening, wood working and sports- [employer] Work history: Owns a tax business  MOBILITY STATUS: Needs Assist:    POSTURE COMMENTS:   Sitting balance: Good supported sitting balance  FUNCTIONAL OUTCOME MEASURES:  Fugl-Meyer: see below  UPPER EXTREMITY ROM:    Active ROM Right Eval  [facility] Right 09/23/2023 [facility] Left eval Left 09/23/2023 Left 10/29/23 Left 12/02/23  Shoulder flexion   89(110) 109(114) 111(115) 90 before scaption (110)  Shoulder abduction   98(120) 109(120) 111(122) 100 (110)  Shoulder adduction        Shoulder extension        Shoulder internal rotation        Shoulder external rotation        Elbow flexion   120(140) 130(136) 141(145)   Elbow extension   -28(-10) -5(0) -18(0)   Wrist flexion   60(60) Rest at 60 50(65) 54(60)   Wrist extension   -48(42) 10(40) 20(50) 30 (45)  Wrist ulnar deviation     12(22)   Wrist radial deviation     10(20)   Wrist pronation     (82)90   Wrist supination     64(74) 70 (75)  (Blank rows = not tested)  Eval: L Digit flexion: 2nd:3cm (0cm) 3rd: 0cm (0cm), 4th: 0cm (0cm), 5th: 2cm (0cm)  09/23/23:  L Digit flexion to [facility]: 2nd: 3.5cm (0cm), 3rd: 4 cm(0cm), 4th 3.5cm (0cm), 5th:  2cm (0cm)  Eval:  L Digit extension: Active Gross digit extension through 10% of ROM  09/23/23:  L Digit extension: Active Gross digit extension through 50% of ROM  10/29/23:  L Digit extension: Active Gross digit extension through 25-50% of ROM  12/02/23:  L digit extension  Active gross ext through 30%   UPPER EXTREMITY MMT:     MMT Right eval Left eval Left 09/23/2023 Left 10/29/23 Left 12/02/23  Shoulder flexion 5 3- 3-/5 3-/5   Shoulder abduction 5 3- 3-/5 3-/5   Shoulder adduction       Shoulder extension       Shoulder internal rotation       Shoulder external rotation       Middle trapezius       Lower trapezius       Elbow flexion 5 TBD 3+/5 4-/5 4+/5  Elbow  extension 5 TBD 4-/5 3-/5 4+/5  Wrist flexion   2/5 3-/5 3-  Wrist extension  2- 2/5 2+/5 3-  Wrist ulnar deviation       Wrist radial deviation       Wrist pronation    3/5 3+  Wrist supination    3-/5 3-  (Blank rows = not tested)  HAND FUNCTION:  Eval:  Grip strength: Right: 94#, Left: 1#,  Lateral Key Pinch strength: Right: 14#, Left: 3#  10/29/23:  Grip strength: Left: 16# Lateral Key Pinch strength: Left: 5#  12/02/23: L: 5 lbs, R: 98 # Lateral pinch: Left: 5 lbs   COORDINATION: Box and Blocks: Right: 34 blocks completed in 1 min., Left: 0 blocks completed in 1 min.   10/29/23:  Box and Blocks: Right: 34 blocks completed in 1 min., Left: 2 blocks completed in 1 min.  9 Hole Peg Test: Left: To remove 9 pegs from a vertical position: 1 min. &13 sec.  12/02/23: Box and Blocks: 4 blocks 12/02/23: 4 pegs out in 3 min (multiple pulled and dropped out of the cup)   FUGL-MEYER ASSESSMENT   A. Upper Extremity   I. Reflex Activity:   12/12/23   Flexors (biceps) 2   Extensors (triceps) 2   Total 4/4                 II. Volitional Movement within Synergies:   12/12/23   Flexor Synergy:      Shoulder Elevation 2   Shoulder Retraction 2   Shoulder Abduction (at least 90*) 2   External Rotation  1   Elbow Flexion 2   Forearm Supination 1   Extensor Synergy:      Shoulder Adduction/Internal Rotation 2   Elbow Extension 1   Forearm Pronation 2   Total 15/18     III. Movement Combining Synergies:   12/12/23   Hand to Lumbar Spine 1   Shoulder Flexion at 90*, elbow at 0* 0   Pron/Sup w/elbow at 90* and shoulder at 0* 1   Total 2/6     IV. Movement Out of Synergy   12/12/23   Shoulder Abduction to 90*, elbow at 0*, forearm pronated 0   Shoulder Flexion 9)-180*, elbow at 0*, forearm in mid position 0   Pron/sup, elbow at 0* and shoulder between 30-90* of flexion 0   Total 0/6       B. Wrist   12/12/23   Stability of wrist at 15* ext, elbow at 90*, shoulder 0* 2   Repeated flex/ext, elbow at 90*, shoulder 0* 1   Stability of wrist at 15* ext, elbow at 0*, shoulder 30* 0   Flex/ext, elbow 0*, shoulder 30* 0   Circumduction  1   Total 4/10     C. Hand   12/12/23   Mass Flexion 1   Mass Extension 1   Total 2/4       D. Grasp   12/12/23   Hook  0   Thumb Adduction - paper 1   Pincer - pen 0   Cylindrical - cup/can 0   Spherical - tennis ball 1   Total 2/10     E. Coordination/Speed   12/12/23   Tremor 2   Dysmetria 0   Time 1   Total 3/6     FUGL-MEYER ASSESSMENT: UPPER EXTREMITY A-E TOTAL on (date): 32/66   SENSATION: Sensation feels different in both hands.  L hand Light touch- impaired L hand  Proprioception- impaired  EDEMA: Has had hx of edema, and came to Eval session with swelling in the L hand/wrist/forearm.   *pt. has a laceration from his dog on the dorsal aspect of the L hand.   MUSCLE TONE: Increased tone through the UE and hand flexors.   COGNITION: Overall cognitive status: Within functional limits for tasks assessed  VISION: Subjective report: L sided inattention Baseline vision: Wears glasses for reading only Visual history: Right visual occlusion  VISION ASSESSMENT: To be assessed  PERCEPTION: TBD  PRAXIS: Impaired                                                                                                                  TREATMENT DATE: 12/24/2023  Therapeutic Exercise: -AAROM for L thumb palmar abd, radial abd, and IP ext  -AAROM for L hand digit abd/add, manual assist from OT for flat palm to maintaining MP ext -AAROM for L hand digit PIP ext from closed and semi closed fist  AAROM for left wrist flex/extension with digits closed then open.    Applied paddle splint to promote finger extension during tasks and inhibit increase spastic tone in left hand.  Pt seen for focus on reaching tasks with right and left UEs, facilitation of weight shifting to the left with weight bearing through his left hand while engaged in tasks with right UE reaching towards left side.  Progressed to performing weight bearing through left UE in standing with arm placed on table, therapist providing cues and assist for elbow extension, wrist extension and paddle splint in place for finger extension.  Pt responding well, denies any pain in left wrist, just stretch.  He reports it is a little uncomfortable to shift weight to left side, feeling like he will fall over onto therapist.  Education: Education details: Neuro re-ed to LUE Person educated: Patient and spouse Education method: Explanation and Verbal cues Education comprehension: verbalized understanding, further training needed  HOME EXERCISE PROGRAM: -grasping and stacking 1, and 1/2 cubes form the tabletop surface.  GOALS: Goals reviewed with patient? Yes  SHORT TERM GOALS: Target date: 10/01/23  Pt. Will be independent with HEP for the LUE.  Baseline: 12/03/23: indep with current HEP, though ongoing progressions are being made as UE function improves; 10/29/23: Pt. requires assist from wife with HEPs. Pt. Is consistently working on HEP's at home. 09/19/23: Requires assist from his wife Eval; no current HEP Goal status: Ongoing  LONG TERM GOALS: Target date:  01/21/2024  Pt. Will increase L shoulder flexion by 10 degrees to be able to reach up to shelves.  Baseline: 12/03/23: 90* before onset of scaption; 10/29/23: 111(115) 09/19/23: Pt. Continues to be limited with reaching up to shelves. Eval: L shoulder flexion is 89 (110). Goal status: Ongoing  2.  Pt. Will increase L shoulder abduction by 10 degrees to assist with underarm hygiene.  Baseline: 12/03/23: 100*; pt able to demo bilat underarm hygiene with set up; 10/29/23: 888(877) 09/19/23: Pt. Continues to require assist with underarm hygiene.Eval:  L shoulder abduction is 98 (120).  Goal status: ongoing  3.  Pt. Will improve L wrist extension by 10 degrees to be able to initiate anticipation of grasping for objects.  Baseline: 12/03/23: 30* (45); 10/29/23: 20(55) 09/19/23: Consistent activation noted in the left wrist extensors with facilitation. Eval: -48 (42) Goal status: Ongoing  4.  Pt. Will improve active gross digit extension through 50% of the range to be able to release objects in his hand consistently.  Baseline: 12/03/23: Gross digit ext through ~30% of range (limited by flexor tone in the PIPs); 10/28/20: Gross digit extension through 25-50% of the range. 09/19/23: gross digit extension noted through 25% range with facilitation.Eval: gross digit extension is 10% of the range  Goal status: Goal updated to 50% of the range  5.  Pt. Will independently demonstrate visual compensatory strategies when navigating through environment and during tabletop tasks. Baseline: 12/03/23: Pt demonstrates consistent head turns to scan to L visual field without cueing; 10/29/2023: Pt. Continues to less cuing for left sided awareness. 09/19/23: Pt. requires fewer cues for left sided awareness Eval: Pt. Requires consistent cuing for L sided awareness.  Goal status: achieved   6. Pt. Will improve left hand [facility] skills to be able to manipulate small objects during ADLs, and IADLs. Baseline: 12/03/23: Pt removed 3 pegs in >3 min,  pulling out more than 3 but repeatedly dropping to floor or table top rather than assessment dish; 10/29/23: 9 Hole Peg Test: Pt. Was able to remove 9 pegs from vertical position on the pegboard in 1 min. & 13 sec. Pt. Is not yet able to pick up pegs from a horizontal position to place them in the pegboard.   Goal status: ongoing   7. Pt will improve left hand function skills as evidenced by improved score to 4 blocks on the Box and Blocks test. Baseline: 12/03/23: 4 blocks after 3 trials and passive wrist and digit stretching between trials (not yet consistent to achieve 4 blocks); 10/29/23: Box and Blocks Test: 2 Blocks completed in 1 min.   Goal Status: ongoing   ASSESSMENT:  CLINICAL IMPRESSION:  Pt demonstrates spasticity in the left wrist and hand, increased tone with effort to use and incorporate hand into bilateral tasks.  Pt responding well to use of paddle splint this date to promote finger extension.  Focused on weight bearing through left UE in sitting and standing while reaching and engaging right UE to promote weight shifting to the left.  Pt requires increased cues and facilitation with weight shifting to the left.  Continue to work towards goals in plan of care to maximize safety and independence in necessary daily tasks.     PERFORMANCE DEFICITS: in functional skills including ADLs, IADLs, coordination, dexterity, proprioception, sensation, edema, tone, ROM, strength, pain, Fine motor control, Gross motor control, mobility, balance, endurance, vision, and UE functional use, cognitive skills including perception, and psychosocial skills including coping strategies, environmental adaptation, and routines and behaviors.   IMPAIRMENTS: are limiting patient from ADLs, IADLs, and leisure.   CO-MORBIDITIES: may have co-morbidities  that affects occupational performance. Patient will benefit from skilled OT to address above impairments and improve overall function.  MODIFICATION OR ASSISTANCE  TO COMPLETE EVALUATION: Min-Moderate modification of tasks or assist with assess necessary to complete an evaluation.  OT OCCUPATIONAL PROFILE AND HISTORY: Detailed assessment: Review of records and additional review of physical, cognitive, psychosocial history related to current functional performance.  CLINICAL DECISION MAKING: Moderate - several treatment options, min-mod  task modification necessary  REHAB POTENTIAL: Good  EVALUATION COMPLEXITY: Moderate  PLAN:  OT FREQUENCY: 2x/week  OT DURATION: 12 weeks  PLANNED INTERVENTIONS: 97168 OT Re-evaluation, 97535 self care/ADL training, 02889 therapeutic exercise, 97530 therapeutic activity, 97112 neuromuscular re-education, 97140 manual therapy, 97018 paraffin, 02989 moist heat, 97010 cryotherapy, 97034 contrast bath, 97760 Orthotic Initial, 97763 Orthotic/Prosthetic subsequent, passive range of motion, visual/perceptual remediation/compensation, energy conservation, and DME and/or AE instructions  RECOMMENDED OTHER SERVICES: PT and ST  CONSULTED AND AGREED WITH PLAN OF CARE: Patient and family member/caregiver  PLAN FOR NEXT SESSION: see above  [place] Morren T Payeton Germani, OTR/L, CLT   Reighan Hipolito, OT 12/24/2023, 7:52 PM

## 2023-12-26 ENCOUNTER — Encounter: Attending: Internal Medicine | Admitting: Physician Assistant

## 2023-12-26 ENCOUNTER — Encounter: Admitting: Speech Pathology

## 2023-12-26 ENCOUNTER — Ambulatory Visit: Attending: Physician Assistant

## 2023-12-26 ENCOUNTER — Ambulatory Visit: Admitting: Physical Therapy

## 2023-12-26 DIAGNOSIS — R2681 Unsteadiness on feet: Secondary | ICD-10-CM | POA: Insufficient documentation

## 2023-12-26 DIAGNOSIS — R278 Other lack of coordination: Secondary | ICD-10-CM | POA: Diagnosis present

## 2023-12-26 DIAGNOSIS — L97821 Non-pressure chronic ulcer of other part of left lower leg limited to breakdown of skin: Secondary | ICD-10-CM | POA: Diagnosis not present

## 2023-12-26 DIAGNOSIS — L97811 Non-pressure chronic ulcer of other part of right lower leg limited to breakdown of skin: Secondary | ICD-10-CM | POA: Insufficient documentation

## 2023-12-26 DIAGNOSIS — I890 Lymphedema, not elsewhere classified: Secondary | ICD-10-CM | POA: Diagnosis present

## 2023-12-26 DIAGNOSIS — I69354 Hemiplegia and hemiparesis following cerebral infarction affecting left non-dominant side: Secondary | ICD-10-CM | POA: Insufficient documentation

## 2023-12-26 DIAGNOSIS — R262 Difficulty in walking, not elsewhere classified: Secondary | ICD-10-CM | POA: Diagnosis present

## 2023-12-26 DIAGNOSIS — E11622 Type 2 diabetes mellitus with other skin ulcer: Secondary | ICD-10-CM | POA: Insufficient documentation

## 2023-12-26 DIAGNOSIS — R269 Unspecified abnormalities of gait and mobility: Secondary | ICD-10-CM | POA: Diagnosis present

## 2023-12-26 DIAGNOSIS — M6281 Muscle weakness (generalized): Secondary | ICD-10-CM | POA: Diagnosis present

## 2023-12-26 NOTE — Therapy (Signed)
 OUTPATIENT PHYSICAL THERAPY TREATMENT  Patient Name: [name]. MRN: [id number] DOB:[birth year], 73 y.o., male Today's Date: 12/26/2023 PCP: Dennise Remak, MD  REFERRING PROVIDER:  Pegge Toribio PARAS, PA-C  END OF SESSION:    PT End of Session - 12/26/23 1403     Visit Number 39    Number of Visits 40    Date for Recertification  12/31/23    Authorization Type [employer] Medicare    Authorization Time Period [facility] auth#: [id number] for 2 PT vst from 9/30-10/28    Authorization - Visit Number 1    Authorization - Number of Visits 2    Progress Note Due on Visit 40    PT Start Time 1403    PT Stop Time 1445    PT Time Calculation (min) 42 min    Equipment Utilized During Treatment Gait belt    Activity Tolerance Patient tolerated treatment well;No increased pain    Behavior During Therapy WFL for tasks assessed/performed             Past Medical History:  Diagnosis Date   Anemia    Cervical myelopathy (HCC)    Chronic venous insufficiency    [name] (chronic kidney disease), stage III (HCC)    Diabetes mellitus without complication (HCC)    GERD (gastroesophageal reflux disease)    Hypercholesteremia    Hypothyroidism    Kidney stones    about 50 in the past--last in March 4/24   Leg weakness, bilateral    due to Myasthenia Gravis   Myasthenia gravis (HCC)    Spinal stenosis    Vitamin B 12 deficiency    Vitamin B 12 deficiency    Wears hearing aid    bilateral   Past Surgical History:  Procedure Laterality Date   BACK SURGERY  09/2015   CARDIAC CATHETERIZATION     CATARACT EXTRACTION W/ INTRAOCULAR LENS IMPLANT     CATARACT EXTRACTION W/PHACO Right 12/21/2015   Procedure: CATARACT EXTRACTION PHACO AND INTRAOCULAR LENS PLACEMENT (IOC);  Surgeon: Dene Etienne, MD;  Location: [facility];  Service: Ophthalmology;  Laterality: Right;  DIABETIC - insulin  and oral meds   COLONOSCOPY     SHOULDER SURGERY     labrum repair   TONSILLECTOMY      Patient Active Problem List   Diagnosis Date Noted   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) 10/11/2023   High blood pressure 08/13/2023   Anemia 08/13/2023   Constipation 08/13/2023   Myasthenia gravis (HCC) 08/13/2023   Acute ischemic right MCA stroke (HCC) 07/19/2023   Lymphedema 12/27/2016   Leg pain 07/15/2016   Chronic venous insufficiency 07/15/2016   Swelling of limb 07/15/2016   Type 2 diabetes mellitus (HCC) 07/15/2016   Hyperlipidemia 07/15/2016    ONSET DATE: 07/19/23  REFERRING DIAG:  P36.488 (ICD-10-CM) - Cerebral infarction due to unspecified occlusion or stenosis of right middle cerebral artery   THERAPY DIAG:  Hemiplegia and hemiparesis following cerebral infarction affecting left non-dominant side (HCC)  Muscle weakness (generalized)  Other lack of coordination  Difficulty in walking, not elsewhere classified  Unsteadiness on feet  Abnormality of gait and mobility  Rationale for Evaluation and Treatment: Rehabilitation  SUBJECTIVE:  SUBJECTIVE STATEMENT:   Pt reports he had his weekly appointment with the wound clinic this morning. Pt states he was whooped after therapy session on Tuesday and that he slept like a log that night. Pt sates he is still warn out today. Pt states he didn't do much/any walking yesterday due to the fatigue. Denies pain.  Denies stumbles/falls.    PERTINENT HISTORY:  CVA with Left hemiplegia in April 2025. Pt started on a swedish knee cage at CIR due to uncontrolled hyperextension in stance. Pt had not been moving much since discharging from [facility], as therapists instructed pt to perform transfers only at home due to high fall risk with ambulation. Continues to have significant weakenss in the LUE  with difficulty extending fingers as well as numbness and inattention to the L side of body resulting in multiple cuts on Arm and leg with WC mobility in home. Will be attaining custom power WC from [employer]. Has loaner currently.   PAIN:  Are you having pain? No  PRECAUTIONS: Fall, history of wounds on LLE   *Latex allergy   WEIGHT BEARING RESTRICTIONS: No  FALLS: Has patient fallen in last 6 months? Yes. Number of falls 1  LIVING ENVIRONMENT: Lives with: lives with their spouse Lives in: House/apartment Stairs: Ramps installed while in the Hospital  Has following equipment at home: Vannie - 2 wheeled and Wheelchair (power)  PLOF: Independent with basic ADLs, Independent with household mobility without device, and Independent with community mobility with device with [facility]   PATIENT GOALS: relearn to Walk 15-12ft.   OBJECTIVE:                                                                                                                              TREATMENT DATE: 12/26/2023  Pt arrives in transport chair.   Donned pt's L LE SKC to use during session. Noticed the strap around his lower leg is still a little tight today due to increased edema - pt and wife stated they have been continuing the recommended edema management strategies from the wound care clinic (primarily elevating his LEs)  Gait training ~69ft over to stairs using bari-RW with L hand splint and CGA for steadying - pt is demonstrating excellent progress with his gait training showing improved management of AD, L LE gait mechanics, and balance with the goal to at least reach a functional ambulatory level in the home environment.  Stair navigation training ascending/descending 8 steps (6 height) using B HRs with skilled min A primarily during descent as follows: Unable to keep L hand grasp on railing during descent; therefore, only using R UE support Step-to pattern leading with R LE on ascent - pt does well advancing L LE  without his L toes catching on the lip of the stair Only requires light min A for steadying during ascent! Step-to pattern leading with L LE on descent Requires skilled lighter min A primarily for L LE management onto  next step during descent, and light min A for balance L knee goes into full extension when accepting weight, causing minor posterior lean - requiring light min A to facilitate anterior weight shift onto L foot for improved balance; however, this is improving Continued cuing to maintain forward step-to pattern on descent, rather than attempting sideways stepping because pt has more difficulty performing a lateral step  Gait over to treadmill using bari-RW with CGA. Pt aware that occasionally he will not lift L LE adequately to clear his foot from the floor and move it to where he needs in order to keep his balance when turning and his increased awareness is helping him correct this incorrect step.  Pt able to step on/off treadmill in litegait harness for balance support, using B UE support on handrails. Therapist providing light manual facilitation for L LE posterior stepping off treadmill.   Continuous HR machine unavailable during session today  Gait training the following trials on the treadmill in litegait harness providing balance support, but not true BWS until attempting gait without UE support: B UE support on litegait handles, requires ACE wrap around L hand to maintain grasp on handle , starting at 1.70mph and increased to 1.2 after 1 min., totaling 259ft Able to advance L LE independently, even at increased speeds and continued to achieve reciprocal pattern! Pt only assisting with ensuring L LE clears full ~1-2x throughout and even when pt catches his toes, he is able to correct on next step without physical assistanc HR unable to be assessed at this time due to machine not being available, Borg RPE 13/20 Standing rest break  Attempted gait training without R UE  support Started at 0.28mph tried to increase to 1.70mph but then decreased back to 0.77mph then had to stop due to LOB  Pt really reliant on R UE support to maintain balance and shift weight 15ft in 33 seconds Standing rest/reset Again attempted gait without R UE support 0.68mph for reaching 98ft Therapist faciltiating wt shifting onto stance limbs and pt not using R UE support throughout! Noticed pt with decreased L wt shift onto stance limb and decreased L hip abductor and glute activation for improved stance support +2 A managing litegait gait speed and guarding pt's L LE to ensure initiating swing phase at appropriate timing Reports RPE 14/20 for this exercise due to breathing hard from fear and exercising   Progressed to consistent gait training on treadmill without R UE support!! This will help significantly with developing motor plan to wt shift and sequence standing on stance limbs.   Stepped off treadmill in litegait harness and doffed harness.     PATIENT EDUCATION: Education details: knee cage needs assessmnet as it is causeing crossover gait.  Person educated: Patient and Spouse Education method: Explanation, VC/TC/Demo Education comprehension: verbalized understanding  HOME EXERCISE PROGRAM: Access Code: [email or web address] URL: [email or web address] Date: 09/11/2023 Prepared by: Massie Dollar  Exercises - Seated Knee Extension AROM  - 1 x daily - 4 x weekly - 3 sets - 10 reps - Seated Hip Abduction  - 1 x daily - 4 x weekly - 3 sets - 10 reps - Seated March  - 1 x daily - 4 x weekly - 3 sets - 10 reps - Sit to Stand with Counter Support  - 1 x daily - 4 x weekly - 3 sets - 10 reps - Supine Short Arc Quad  - 1 x daily - 4 x weekly - 3 sets - 10 reps -  Supine Hip Abduction  - 1 x daily - 4 x weekly - 3 sets - 10 reps - Supine Bridge  - 1 x daily - 4 x weekly - 3 sets - 3 reps - 2sec  hold  12/17/2023: verbal instruction to complete repeated sit<>stands with  education on safe set-up provided  GOALS: Goals reviewed with patient? Yes  SHORT TERM GOALS: Target date: 12/12/2023  Patient will be independent in home exercise program to improve strength/mobility for better functional independence with ADLs. Baseline: Initiated on 09/11/2023 10/22/2023: will update when appropriate 11/26/2023: patient participating in transfer and gait practice in controlled environment with 2 person assistance at home in addition to HEP Goal status: IN PROGRESS  LONG TERM GOALS: Target date: 12/31/2023  Patient will increase SIS-16 score to equal to or greater than 10points to demonstrate statistically significant improvement in mobility and quality of life.  Baseline: 49/80 09/19/2023: 45/80 10/22/2023: 46/80 11/26/2023: 47/80 Goal status: IN PROGRESS  2.  Patient (> 82 years old) will complete five times sit to stand test in < 15 seconds indicating an increased LE strength and improved balance. Baseline: 1:12min 09/19/2023: 56.39 seconds using R UE support to push-up from armrest of green chair and requiring skilled min A for balance and lifting/lowering  10/22/2023: 27.10 seconds using R UE support to push-up from armrest  8/12: 27.7 sec with RUE pushing from arm rest.  11/26/2023: 37.42 seconds using R UE support to push-up from armrest of green chair and not using L UE support with min-modA for facilitating anterior weight shift and lifting to come to stand secondary to fatigue (would benefit from re-testing at next session) 12/17/2023: 24.59 seconds using R UE support to push-up from armrest of green chair and not using L UE support with CGA/light min A for safety/steadying  Goal status: IN PROGRESS  3.  Patient will increase FIST score by > 6 points to demonstrate decreased fall risk during functional activities Baseline:  43/56  09/23/2023: 53/56 Goal status: MET and UPGRADED  09/23/2023: Patient will increase Berg Balance score to > 45/56 to demonstrate improved  balance and decreased fall risk during functional activities and ADLs.  Baseline: 09/23/2023: 14/56 7/31: 20/56 using RW support 11/26/2023: 17/56 without AD support Goal status: IN PROGRESS  4.  Patient will increase 10 meter walk test to >0.29m/s as to improve gait speed for better community ambulation and to reduce fall risk. Baseline: 09/09/2023: 0.103 m/s ( and 37 seconds) using bari-RW with skilled heavy min A and +2 w/c follow for safety  09/19/2023: 0.59m/s using bari-RW with skilled min A of 1 and +2 w/c follow for safety 10/22/2023: 0.1875 m/s using bari-RW with L hand splint, L swedish knee cage, and only skilled light min A of 1 (no wheelchair follow)  8/12: 0.175m/s with RW, and L hand splint. No knee cage on this day.  11/26/2023: Average Normal speed: 0.20 m/s & Average Fast speed: 0.275 m/s using bari-RW with CGA/min A for balance wearing [facility] 12/17/2023: Average Normal speed: 0.32 m/s using bari-RW with CGA & Average Fast speed: 0.36 m/s using bari-RW requiring CGA/light min A  Goal status: IN PROGRESS  5.  Patient will reduce timed up and go to <11 seconds to reduce fall risk and demonstrate improved transfer/gait ability. Baseline: unable to complete turn at 10 ft. Mod-max assist due to poor control of LW on RW, resulting in L lateral LOB  09/09/2023: and 32 seconds using bari-RW with L hand splint and +2  on R side for safety, therapist providing skilled min A on L side (Had pt set-up L hand on RW splint, get L foot in proper positioning, and scoot forward in seat before starting the timer) 09/19/2023: 56min43 seconds using bari-RW with only skilled min A of 1 (Had pt set-up L hand on RW splint, get L foot in proper positioning, and scoot forward in seat before starting the timer) 7/2: 78sec(1:18.125) with RW and min assist overall.  10/22/2023: 1 min, 30 seconds using bari-RW with L hand splint and skilled light min assist  8/12: 1:01 min with RW and L hand splint.  11/26/2023:  51.57 seconds using bari-RW with hand splint, wearing SKC, and CGA-minA for balance Goal status: IN PROGRESS   ASSESSMENT:  CLINICAL IMPRESSION:    Patient arrives motivated to participate in therapy session. Therapy session focused on stair navigation training to work towards pt's ability to safely enter/exit their secondary home and family's homes. Patient demos improving motor control of L LE during ascent requiring only light minA/CGA for steadying balance, and pt progressing independence with step-to patten on descent, only requiring skilled light min A for placing L LE onto next step while maintaining balance! Patient continued to participate in high intensity gait training (HIGT) on treadmill increasing to 1. consistently for at least 1 minute and a total of 2 minutes without pt requiring manual facilitation for L LE advancement in swing! Pt slightly scuffing L toes during swing, but then able to recover and improve foot clearance during next swing phase without assistance! Pt able to progress to gait training on treadmill in litegait harness without UE support to improve pt's motor plan of wt shifting onto stance limbs without compensation through R UE, to promote improved balance recovery strategies. Patient able to perform this consistently for at 0.57mph with therapist assisting to facilitate weight shift. Patient remains highly motivated to reach a functional ambulatory level, at least at a household level, and demonstrates great progress towards that goal! Pt will continue to benefit from skilled physical therapy intervention to address impairments, improve QOL, and attain therapy goals. Patient's condition has the potential to improve in response to therapy. Maximum improvement is yet to be obtained. The anticipated improvement is attainable and reasonable in a generally predictable time.      OBJECTIVE IMPAIRMENTS: Abnormal gait, cardiopulmonary status limiting activity, decreased  activity tolerance, decreased balance, decreased cognition, decreased coordination, decreased endurance, decreased knowledge of condition, decreased knowledge of use of DME, decreased mobility, difficulty walking, decreased ROM, decreased strength, decreased safety awareness, dizziness, hypomobility, increased fascial restrictions, impaired perceived functional ability, increased muscle spasms, impaired sensation, impaired tone, impaired UE functional use, impaired vision/preception, improper body mechanics, postural dysfunction, and obesity.   ACTIVITY LIMITATIONS: carrying, lifting, bending, sitting, standing, squatting, stairs, transfers, bed mobility, bathing, toileting, dressing, reach over head, hygiene/grooming, locomotion level, and caring for others  PARTICIPATION LIMITATIONS: meal prep, cleaning, laundry, medication management, interpersonal relationship, driving, shopping, community activity, and yard work  PERSONAL FACTORS: Age, Past/current experiences, Time since onset of injury/illness/exacerbation, and 3+ comorbidities: MG, HTN, CVA, lymphedema are also affecting patient's functional outcome.   REHAB POTENTIAL: Good  CLINICAL DECISION MAKING: Unstable/unpredictable  EVALUATION COMPLEXITY: High  PLAN:  PT FREQUENCY: 1-2x/week  PT DURATION: 8 weeks  PLANNED INTERVENTIONS: 97164- PT Re-evaluation, 97750- Physical Performance Testing, 97110-Therapeutic exercises, 97530- Therapeutic activity, [id number]- Neuromuscular re-education, 97535- Self Care, [id number]- Manual therapy, [id number]- Gait training, [id number]- Orthotic Initial, [id number]- Orthotic/Prosthetic subsequent, [phone]- Canalith repositioning, H9716- Electrical stimulation (  unattended), [phone]- Electrical stimulation (manual), 02402- Wound care (first 20 sq cm), 97598- Wound care (each additional 20 sq cm), Patient/Family education, Balance training, Stair training, Taping, Dry Needling, Joint mobilization, Joint manipulation, Vestibular training,  Visual/preceptual remediation/compensation, Cognitive remediation, DME instructions, Wheelchair mobility training, Cryotherapy, and Moist heat  PLAN FOR NEXT SESSION:    *Progress Note & RECERT*   Follow-up on addition of STS to HEP Continue stair navigation training HIGT (high intensity gait training) using litegait harness either on treadmill vs overground Dynamic gait training using bari-RW vs litegait Backwards Side stepping Obstacle navigation (turning) Working on forward propulsion with increased L hip extension and forward progression of pelvis over L stance phase Dynamic standing balance of trunk rotation Continue monitoring SKC fit & see if posterior knee strap needs to be adjusted   Connell Kiss, PT, DPT, NCS, CSRS Physical Therapist - [place]  [facility]  5:53 PM 12/26/23

## 2023-12-27 NOTE — Therapy (Signed)
 Outpatient Occupational Therapy Neuro Treatment Note  Patient Name: [name]. MRN: [id number] DOB:01-Jan-1951, 73 y.o., male Today's Date: 12/27/2023  PCP: Alm Rower, MD REFERRING PROVIDER: Hermann Toribio PARAS, PA-C  END OF SESSION:  OT End of Session - 12/27/23 1528     Visit Number 37    Number of Visits 48    Date for Recertification  01/21/24    OT Start Time 1315    OT Stop Time 1400    OT Time Calculation (min) 45 min    Activity Tolerance Patient tolerated treatment well    Behavior During Therapy WFL for tasks assessed/performed         Past Medical History:  Diagnosis Date   Anemia    Cervical myelopathy (HCC)    Chronic venous insufficiency    [name] (chronic kidney disease), stage III (HCC)    Diabetes mellitus without complication (HCC)    GERD (gastroesophageal reflux disease)    Hypercholesteremia    Hypothyroidism    Kidney stones    about 50 in the past--last in March 4/24   Leg weakness, bilateral    due to Myasthenia Gravis   Myasthenia gravis (HCC)    Spinal stenosis    Vitamin B 12 deficiency    Vitamin B 12 deficiency    Wears hearing aid    bilateral   Past Surgical History:  Procedure Laterality Date   BACK SURGERY  09/2015   CARDIAC CATHETERIZATION     CATARACT EXTRACTION W/ INTRAOCULAR LENS IMPLANT     CATARACT EXTRACTION W/PHACO Right 12/21/2015   Procedure: CATARACT EXTRACTION PHACO AND INTRAOCULAR LENS PLACEMENT (IOC);  Surgeon: Dene Etienne, MD;  Location[facility];  Service: Ophthalmology;  Laterality: Right;  DIABETIC - insulin  and oral meds   COLONOSCOPY     SHOULDER SURGERY     labrum repair   TONSILLECTOMY     Patient Active Problem List   Diagnosis Date Noted   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) 10/11/2023   High blood pressure 08/13/2023   Anemia 08/13/2023   Constipation 08/13/2023   Myasthenia gravis (HCC) 08/13/2023   Acute ischemic right  MCA stroke (HCC) 07/19/2023   Lymphedema 12/27/2016   Leg pain 07/15/2016   Chronic venous insufficiency 07/15/2016   Swelling of limb 07/15/2016   Type 2 diabetes mellitus (HCC) 07/15/2016   Hyperlipidemia 07/15/2016   ONSET DATE: 07/13/2023  REFERRING DIAG: Acute ischemic R MCA CVA  THERAPY DIAG: muscle weakness (generalized), other lack of coordination, vision disturbance, hemiplegia and hemiparesis following cerebral infarction affecting left non-dominant side (HCC)  Rationale for Evaluation and Treatment: Rehabilitation  SUBJECTIVE:  SUBJECTIVE STATEMENT: Pt reports having a wound center appt this morning, and he will be starting an ABX for his leg wounds. Pt accompanied by: [name]  PERTINENT HISTORY: Pt. Was admitted to [facility] on 07/13/2023 after sustaining a CVA while on a trip to the beach. Pt. Was diagnosed with R MCA CVA. Pt. Was admitted to inpatient rehab from 07/19/2023-08/13/2023. Past medical history includes high BP, anemia, and Myasthenia Gravis.   PRECAUTIONS: None  WEIGHT BEARING RESTRICTIONS: No  PAIN: 12/26/23: No pain  FALLS: Has patient fallen in last 6 months? Yes  LIVING ENVIRONMENT: Lives with: lives with their family and lives with their spouse-  Lives in: House/apartment- 2 story home and resides mostly on the 1st floor Stairs: No- Ramped entrance Has following equipment at home:  Vannie, cane, Steadi, shower chair,  handheld shower head, bedside commode  PLOF: Independent and Independent with basic ADLs Independent at home   PATIENT GOALS: Would like to walk 10-20 feet each time.   OBJECTIVE:  Note: Objective measures were completed at Evaluation unless otherwise noted.  HAND DOMINANCE: Right  ADLs: Overall ADLs:  Transfers/ambulation related to ADLs: Eating: Independent, 100% R handed Grooming: independent UB Dressing: Can put on shirt independently LB Dressing: Difficulty with pants and requires assistance getting the pants on his  feet, has difficulty putting on shoes and socks Toileting: Tot A for toilet hygiene. Uses Steadi during toilet transfers. Bathing: Requires assist with using the L arm to wash the R arm. Tub Shower transfers: Roll in shower, built in shower chair, grab bars and hand held shower head.  IADLs: Shopping: Total A (Wife typically did the shopping prior to onset)   Light housekeeping: Total A (wife typically performs prior to onset) Meal Prep: Independent with light snack prep Community mobility: No driving, able to get in/out of the car Medication management: Set up assistance from his wife using a pill box (pt. Is responsible for taking medications at the correct time) Financial management: family members check behind him. Handwriting: TBD Hobbies: gardening, wood working and sports- [employer] Work history: Owns a tax business  MOBILITY STATUS: Needs Assist:    POSTURE COMMENTS:   Sitting balance: Good supported sitting balance  FUNCTIONAL OUTCOME MEASURES:  Fugl-Meyer: see below  UPPER EXTREMITY ROM:    Active ROM Right Eval  [facility] Right 09/23/2023 [facility] Left eval Left 09/23/2023 Left 10/29/23 Left 12/02/23  Shoulder flexion   89(110) 109(114) 111(115) 90 before scaption (110)  Shoulder abduction   98(120) 109(120) 111(122) 100 (110)  Shoulder adduction        Shoulder extension        Shoulder internal rotation        Shoulder external rotation        Elbow flexion   120(140) 130(136) 141(145)   Elbow extension   -28(-10) -5(0) -18(0)   Wrist flexion   60(60) Rest at 60 50(65) 54(60)   Wrist extension   -48(42) 10(40) 20(50) 30 (45)  Wrist ulnar deviation     12(22)   Wrist radial deviation     10(20)   Wrist pronation     (82)90   Wrist supination     64(74) 70 (75)  (Blank rows = not tested)  Eval: L Digit flexion: 2nd:3cm (0cm) 3rd: 0cm (0cm), 4th: 0cm (0cm), 5th: 2cm (0cm)  09/23/23:  L Digit flexion to [facility]: 2nd: 3.5cm (0cm), 3rd: 4 cm(0cm), 4th 3.5cm (0cm), 5th: 2cm  (0cm)  Eval:  L Digit extension: Active Gross digit extension through 10% of ROM  09/23/23:  L Digit extension: Active Gross digit extension through 50% of ROM  10/29/23:  L Digit extension: Active Gross digit extension through 25-50% of ROM  12/02/23:  L digit extension  Active gross ext through 30%   UPPER EXTREMITY MMT:     MMT Right eval Left eval Left 09/23/2023 Left 10/29/23 Left 12/02/23  Shoulder flexion 5 3- 3-/5 3-/5   Shoulder abduction 5 3- 3-/5 3-/5   Shoulder adduction       Shoulder extension       Shoulder internal rotation       Shoulder external rotation       Middle trapezius       Lower trapezius       Elbow flexion 5 TBD 3+/5 4-/5 4+/5  Elbow extension 5 TBD 4-/5 3-/5 4+/5  Wrist flexion   2/5 3-/5 3-  Wrist extension  2- 2/5 2+/5 3-  Wrist ulnar deviation       Wrist radial deviation       Wrist pronation    3/5 3+  Wrist supination    3-/5 3-  (Blank rows = not tested)  HAND FUNCTION:  Eval:  Grip strength: Right: 94#, Left: 1#,  Lateral Key Pinch strength: Right: 14#, Left: 3#  10/29/23:  Grip strength: Left: 16# Lateral Key Pinch strength: Left: 5#  12/02/23: L: 5 lbs, R: 98 # Lateral pinch: Left: 5 lbs   COORDINATION: Box and Blocks: Right: 34 blocks completed in 1 min., Left: 0 blocks completed in 1 min.   10/29/23:  Box and Blocks: Right: 34 blocks completed in 1 min., Left: 2 blocks completed in 1 min.  9 Hole Peg Test: Left: To remove 9 pegs from a vertical position: 1 min. &13 sec.  12/02/23: Box and Blocks: 4 blocks 12/02/23: 4 pegs out in 3 min (multiple pulled and dropped out of the cup)   FUGL-MEYER ASSESSMENT   A. Upper Extremity   I. Reflex Activity:   12/12/23   Flexors (biceps) 2   Extensors (triceps) 2   Total 4/4                 II. Volitional Movement within Synergies:   12/12/23   Flexor Synergy:      Shoulder Elevation 2   Shoulder Retraction 2   Shoulder Abduction (at least 90*) 2   External Rotation 1    Elbow Flexion 2   Forearm Supination 1   Extensor Synergy:      Shoulder Adduction/Internal Rotation 2   Elbow Extension 1   Forearm Pronation 2   Total 15/18     III. Movement Combining Synergies:   12/12/23   Hand to Lumbar Spine 1   Shoulder Flexion at 90*, elbow at 0* 0   Pron/Sup w/elbow at 90* and shoulder at 0* 1   Total 2/6     IV. Movement Out of Synergy   12/12/23   Shoulder Abduction to 90*, elbow at 0*, forearm pronated 0   Shoulder Flexion 9)-180*, elbow at 0*, forearm in mid position 0   Pron/sup, elbow at 0* and shoulder between 30-90* of flexion 0   Total 0/6       B. Wrist   12/12/23   Stability of wrist at 15* ext, elbow at 90*, shoulder 0* 2   Repeated flex/ext, elbow at 90*, shoulder 0* 1   Stability of wrist at 15* ext, elbow at 0*, shoulder 30* 0   Flex/ext, elbow 0*, shoulder 30* 0   Circumduction  1   Total 4/10     C. Hand   12/12/23   Mass Flexion 1   Mass Extension 1   Total 2/4       D. Grasp   12/12/23   Hook  0   Thumb Adduction - paper 1   Pincer - pen 0   Cylindrical - cup/can 0   Spherical - tennis ball 1   Total 2/10     E. Coordination/Speed   12/12/23   Tremor 2   Dysmetria 0   Time 1   Total 3/6     FUGL-MEYER ASSESSMENT: UPPER EXTREMITY A-E TOTAL on (date): 32/66   SENSATION: Sensation feels different in both hands.  L hand Light touch- impaired L  hand Proprioception- impaired  EDEMA: Has had hx of edema, and came to Eval session with swelling in the L hand/wrist/forearm.   *pt. has a laceration from his dog on the dorsal aspect of the L hand.   MUSCLE TONE: Increased tone through the UE and hand flexors.   COGNITION: Overall cognitive status: Within functional limits for tasks assessed  VISION: Subjective report: L sided inattention Baseline vision: Wears glasses for reading only Visual history: Right visual occlusion  VISION ASSESSMENT: To be assessed  PERCEPTION: TBD  PRAXIS: Impaired                                                                                                                  TREATMENT DATE: 12/26/2023 Therapeutic Exercise: -Passive stretching for L forearm sup and wrist/digit ext in prep for engaging LUE into resistance exercises and neuro re-ed activities.  -Completed closed chain exes for LUE with yellow theraband for 10-12 reps of each of the following: elbow flex, ext, chest press, row, shoulder flex, abd, ER and IR with elbow at side.  OT provided proximal and distal support for all above noted exercises to maintain form and reduce substitution patterns.  Ranges for shoulder flex and abd limited to <60-70* to stay outside of synergistic flexor tone at the elbow  Neuro re-ed: -Facilitated L hand FMC/dexterity skills working with Architect (poker chips and dice).  Poker chips placed on elevated surface ~4 up from table top d/t pt sitting in high transport chair and lacking sufficient wrist extension for table top level.  Pt practiced picking up poker chips by sliding to surface edge and sustaining lateral and tip pinch.  Not yet able to sustain a 3 point pinch.  -Practiced L hand grasp/release skills with dice, grasping with forearm fully pronated, and releasing in same position.   -Practiced GMC releasing dice and poker chips above a target (into container) with controlled descent of LUE to prevent knocking container after release of dice/chips; mod vc for increasing attention to LUE and to provide motor sequencing cues.   Education: Education details: Neuro re-ed to [employer] Person educated: Patient and spouse Education method: Leisure centre manager cues Education comprehension: verbalized understanding, further training needed  HOME EXERCISE PROGRAM: -grasping and stacking 1, and 1/2 cubes form the tabletop surface.  GOALS: Goals reviewed with patient? Yes  SHORT TERM GOALS: Target date: 10/01/23  Pt. Will be independent with HEP for the LUE.   Baseline: 12/03/23: indep with current HEP, though ongoing progressions are being made as UE function improves; 10/29/23: Pt. requires assist from wife with HEPs. Pt. Is consistently working on HEP's at home. 09/19/23: Requires assist from his wife Eval; no current HEP Goal status: Ongoing  LONG TERM GOALS: Target date: 01/21/2024  Pt. Will increase L shoulder flexion by 10 degrees to be able to reach up to shelves.  Baseline: 12/03/23: 90* before onset of scaption; 10/29/23: 111(115) 09/19/23: Pt. Continues to be limited with reaching up to shelves. Eval: L shoulder flexion is 89 (  110). Goal status: Ongoing  2.  Pt. Will increase L shoulder abduction by 10 degrees to assist with underarm hygiene.  Baseline: 12/03/23: 100*; pt able to demo bilat underarm hygiene with set up; 10/29/23: 888(877) 09/19/23: Pt. Continues to require assist with underarm hygiene.Eval: L shoulder abduction is 98 (120).  Goal status: ongoing  3.  Pt. Will improve L wrist extension by 10 degrees to be able to initiate anticipation of grasping for objects.  Baseline: 12/03/23: 30* (45); 10/29/23: 20(55) 09/19/23: Consistent activation noted in the left wrist extensors with facilitation. Eval: -48 (42) Goal status: Ongoing  4.  Pt. Will improve active gross digit extension through 50% of the range to be able to release objects in his hand consistently.  Baseline: 12/03/23: Gross digit ext through ~30% of range (limited by flexor tone in the PIPs); 10/28/20: Gross digit extension through 25-50% of the range. 09/19/23: gross digit extension noted through 25% range with facilitation.Eval: gross digit extension is 10% of the range  Goal status: Goal updated to 50% of the range  5.  Pt. Will independently demonstrate visual compensatory strategies when navigating through environment and during tabletop tasks. Baseline: 12/03/23: Pt demonstrates consistent head turns to scan to L visual field without cueing; 10/29/2023: Pt. Continues to less cuing  for left sided awareness. 09/19/23: Pt. requires fewer cues for left sided awareness Eval: Pt. Requires consistent cuing for L sided awareness.  Goal status: achieved   6. Pt. Will improve left hand [facility] skills to be able to manipulate small objects during ADLs, and IADLs. Baseline: 12/03/23: Pt removed 3 pegs in >3 min, pulling out more than 3 but repeatedly dropping to floor or table top rather than assessment dish; 10/29/23: 9 Hole Peg Test: Pt. Was able to remove 9 pegs from vertical position on the pegboard in 1 min. & 13 sec. Pt. Is not yet able to pick up pegs from a horizontal position to place them in the pegboard.   Goal status: ongoing   7. Pt will improve left hand function skills as evidenced by improved score to 4 blocks on the Box and Blocks test. Baseline: 12/03/23: 4 blocks after 3 trials and passive wrist and digit stretching between trials (not yet consistent to achieve 4 blocks); 10/29/23: Box and Blocks Test: 2 Blocks completed in 1 min.   Goal Status: ongoing   ASSESSMENT:  CLINICAL IMPRESSION:  Pt with good tolerance to closed chain exercises for LUE using yellow theraband.  Started instruction in strategies to anchor band with use of RUE, though Tband not yet recommended for independent attempts at home as pt requires proximal and distal support of the LUE to maintain form/technique/reduce substitution patterns.  Pt struggled to pick up dice and poker chips from edge of table, requiring OT assist to position poker chip upright for pt to grasp, and to place dice within palm.  Pt had several reps of thinking he had picked up either piece and attempted to discard them, then realized he had not successfully picked them up, showing continued sensory deficit and some L sided neglect.  Pt benefits from reminders to maintain visual attention to L hand throughout all steps of task practice.    Continue to work towards goals in plan of care to maximize safety and independence in necessary daily  tasks.    PERFORMANCE DEFICITS: in functional skills including ADLs, IADLs, coordination, dexterity, proprioception, sensation, edema, tone, ROM, strength, pain, Fine motor control, Gross motor control, mobility, balance, endurance, vision, and  UE functional use, cognitive skills including perception, and psychosocial skills including coping strategies, environmental adaptation, and routines and behaviors.   IMPAIRMENTS: are limiting patient from ADLs, IADLs, and leisure.   CO-MORBIDITIES: may have co-morbidities  that affects occupational performance. Patient will benefit from skilled OT to address above impairments and improve overall function.  MODIFICATION OR ASSISTANCE TO COMPLETE EVALUATION: Min-Moderate modification of tasks or assist with assess necessary to complete an evaluation.  OT OCCUPATIONAL PROFILE AND HISTORY: Detailed assessment: Review of records and additional review of physical, cognitive, psychosocial history related to current functional performance.  CLINICAL DECISION MAKING: Moderate - several treatment options, min-mod task modification necessary  REHAB POTENTIAL: Good  EVALUATION COMPLEXITY: Moderate  PLAN:  OT FREQUENCY: 2x/week  OT DURATION: 12 weeks  PLANNED INTERVENTIONS: 97168 OT Re-evaluation, 97535 self care/ADL training, 02889 therapeutic exercise, 97530 therapeutic activity, 97112 neuromuscular re-education, 97140 manual therapy, 97018 paraffin, 02989 moist heat, 97010 cryotherapy, 97034 contrast bath, 97760 Orthotic Initial, 97763 Orthotic/Prosthetic subsequent, passive range of motion, visual/perceptual remediation/compensation, energy conservation, and DME and/or AE instructions  RECOMMENDED OTHER SERVICES: PT and ST  CONSULTED AND AGREED WITH PLAN OF CARE: Patient and family member/caregiver  PLAN FOR NEXT SESSION: see above   Inocente MARLA Blazing, OT 12/27/2023, 3:29 PM

## 2023-12-30 NOTE — Progress Notes (Signed)
 Attestation Statement:   I personally saw and evaluated the patient, and participated in the management and treatment plan as documented in the resident/fellow note.  ELSIE NORLEEN BREEDING, MD

## 2023-12-31 ENCOUNTER — Ambulatory Visit: Admitting: Physical Therapy

## 2023-12-31 ENCOUNTER — Encounter: Admitting: Speech Pathology

## 2023-12-31 ENCOUNTER — Ambulatory Visit: Admitting: Occupational Therapy

## 2023-12-31 DIAGNOSIS — R2681 Unsteadiness on feet: Secondary | ICD-10-CM

## 2023-12-31 DIAGNOSIS — M6281 Muscle weakness (generalized): Secondary | ICD-10-CM

## 2023-12-31 DIAGNOSIS — R262 Difficulty in walking, not elsewhere classified: Secondary | ICD-10-CM

## 2023-12-31 DIAGNOSIS — I69354 Hemiplegia and hemiparesis following cerebral infarction affecting left non-dominant side: Secondary | ICD-10-CM

## 2023-12-31 DIAGNOSIS — R269 Unspecified abnormalities of gait and mobility: Secondary | ICD-10-CM

## 2023-12-31 DIAGNOSIS — R278 Other lack of coordination: Secondary | ICD-10-CM

## 2023-12-31 NOTE — Therapy (Signed)
 Outpatient Occupational Therapy Neuro Treatment/Recertification/Insurance update Note  Patient Name: [name]. MRN: [id number] DOB:1951/01/19, 73 y.o., male Today's Date: 12/31/2023  PCP: Alm Rower, MD REFERRING PROVIDER: Hermann Toribio PARAS, PA-C  END OF SESSION:  OT End of Session - 12/31/23 1706     Visit Number 38    Number of Visits 48    Date for Recertification  03/24/2024   OT Start Time 1445    OT Stop Time 1530    OT Time Calculation (min) 45 min    Activity Tolerance Patient tolerated treatment well    Behavior During Therapy [facility] for tasks assessed/performed         Past Medical History:  Diagnosis Date   Anemia    Cervical myelopathy (HCC)    Chronic venous insufficiency    [name] (chronic kidney disease), stage III (HCC)    Diabetes mellitus without complication (HCC)    GERD (gastroesophageal reflux disease)    Hypercholesteremia    Hypothyroidism    Kidney stones    about 50 in the past--last in March 4/24   Leg weakness, bilateral    due to Myasthenia Gravis   Myasthenia gravis (HCC)    Spinal stenosis    Vitamin B 12 deficiency    Vitamin B 12 deficiency    Wears hearing aid    bilateral   Past Surgical History:  Procedure Laterality Date   BACK SURGERY  09/2015   CARDIAC CATHETERIZATION     CATARACT EXTRACTION W/ INTRAOCULAR LENS IMPLANT     CATARACT EXTRACTION W/PHACO Right 12/21/2015   Procedure: CATARACT EXTRACTION PHACO AND INTRAOCULAR LENS PLACEMENT (IOC);  Surgeon: Dene Etienne, MD;  Location: [facility];  Service: Ophthalmology;  Laterality: Right;  DIABETIC - insulin  and oral meds   COLONOSCOPY     SHOULDER SURGERY     labrum repair   TONSILLECTOMY     Patient Active Problem List   Diagnosis Date Noted   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) 10/11/2023   High blood pressure 08/13/2023   Anemia 08/13/2023   Constipation 08/13/2023   Myasthenia gravis (HCC)  08/13/2023   Acute ischemic right MCA stroke (HCC) 07/19/2023   Lymphedema 12/27/2016   Leg pain 07/15/2016   Chronic venous insufficiency 07/15/2016   Swelling of limb 07/15/2016   Type 2 diabetes mellitus (HCC) 07/15/2016   Hyperlipidemia 07/15/2016   ONSET DATE: 07/13/2023  REFERRING DIAG: Acute ischemic R MCA CVA  THERAPY DIAG: muscle weakness (generalized), other lack of coordination, vision disturbance, hemiplegia and hemiparesis following cerebral infarction affecting left non-dominant side (HCC)  Rationale for Evaluation and Treatment: Rehabilitation  SUBJECTIVE:  SUBJECTIVE STATEMENT:  Pt. Reports having an appointment with the the [employer] rep this week. Pt accompanied by: [name]  PERTINENT HISTORY: Pt. Was admitted to [facility] on 07/13/2023 after sustaining a CVA while on a trip to the beach. Pt. Was diagnosed with R MCA CVA. Pt. Was admitted to inpatient rehab from 07/19/2023-08/13/2023. Past medical history includes high BP, anemia, and Myasthenia Gravis.   PRECAUTIONS: None  WEIGHT BEARING RESTRICTIONS: No  PAIN: 12/26/23: No pain  FALLS: Has patient fallen in last 6 months? Yes  LIVING ENVIRONMENT: Lives with: lives with their family and lives with their spouse-  Lives in: House/apartment- 2 story home and resides mostly on the 1st floor Stairs: No- Ramped entrance Has following equipment at home:  [name], cane, Steadi, shower chair, handheld shower head, bedside commode  PLOF:  Independent and Independent with basic ADLs Independent at home   PATIENT GOALS: Would like to walk 10-20 feet each time.   OBJECTIVE:  Note: Objective measures were completed at Evaluation unless otherwise noted.  HAND DOMINANCE: Right  ADLs: Overall ADLs:  Transfers/ambulation related to ADLs: Eating: Independent, 100% R handed Grooming: independent UB Dressing: Can put on shirt independently LB Dressing: Difficulty with pants and requires assistance getting the pants on  his feet, has difficulty putting on shoes and socks Toileting: Tot A for toilet hygiene. Uses Steadi during toilet transfers. Bathing: Requires assist with using the L arm to wash the R arm. Tub Shower transfers: Roll in shower, built in shower chair, grab bars and hand held shower head.  IADLs: Shopping: Total A (Wife typically did the shopping prior to onset)   Light housekeeping: Total A (wife typically performs prior to onset) Meal Prep: Independent with light snack prep Community mobility: No driving, able to get in/out of the car Medication management: Set up assistance from his wife using a pill box (pt. Is responsible for taking medications at the correct time) Financial management: family members check behind him. Handwriting: TBD Hobbies: gardening, wood working and sports- [employer] Work history: Owns a tax business  MOBILITY STATUS: Needs Assist:    POSTURE COMMENTS:   Sitting balance: Good supported sitting balance  FUNCTIONAL OUTCOME MEASURES:  Fugl-Meyer: see below  UPPER EXTREMITY ROM:    Active ROM Right Eval  [facility] Right 09/23/2023 [facility] Left eval Left 09/23/2023 Left 10/29/23 Left 12/02/23 Left 12/31/23  Shoulder flexion   89(110) 109(114) 111(115) 90 before scaption (110) 95 before scaption (110)  Shoulder abduction   98(120) 109(120) 111(122) 100 (110) 105(118)  Shoulder adduction         Shoulder extension         Shoulder internal rotation         Shoulder external rotation         Elbow flexion   120(140) 130(136) 141(145)  146  Elbow extension   -28(-10) -5(0) -18(0)  -3(0)  Wrist flexion   60(60) Rest at 60 50(65) 54(60)    Wrist extension   -48(42) 10(40) 20(50) 30 (45) 30(45)  Wrist ulnar deviation     12(22)    Wrist radial deviation     10(20)    Wrist pronation     (82)90    Wrist supination     64(74) 70 (75)   (Blank rows = not tested)  Eval: L Digit flexion: 2nd:3cm (0cm) 3rd: 0cm (0cm), 4th: 0cm (0cm), 5th: 2cm (0cm)  09/23/23:  L Digit  flexion to [facility]: 2nd: 3.5cm (0cm), 3rd: 4 cm(0cm), 4th 3.5cm (0cm), 5th: 2cm (0cm)  Eval:  L Digit extension: Active Gross digit extension through 10% of ROM  09/23/23:  L Digit extension: Active Gross digit extension through 50% of ROM  10/29/23:  L Digit extension: Active Gross digit extension through 25-50% of ROM  12/02/23:  L digit extension: Active gross ext through 30%   12/31/23:  L digit extension:  Active gross ext through ~30-40%      UPPER EXTREMITY MMT:     MMT Right eval Left eval Left 09/23/2023 Left 10/29/23 Left 12/02/23 Left 12/31/23  Shoulder flexion 5 3- 3-/5 3-/5  3-/5  Shoulder abduction 5 3- 3-/5 3-/5  3-5/  Shoulder adduction        Shoulder extension        Shoulder internal rotation  Shoulder external rotation        Middle trapezius        Lower trapezius        Elbow flexion 5 TBD 3+/5 4-/5 4+/5 4+/5  Elbow extension 5 TBD 4-/5 3-/5 4+/5 4+/5  Wrist flexion   2/5 3-/5 3-   Wrist extension  2- 2/5 2+/5 3- 3-/5  Wrist ulnar deviation        Wrist radial deviation        Wrist pronation    3/5 3+   Wrist supination    3-/5 3-   (Blank rows = not tested)  HAND FUNCTION:  Eval:  Grip strength: Right: 94#, Left: 1#,  Lateral Key Pinch strength: Right: 14#, Left: 3#  10/29/23:  Grip strength: Left: 16# Lateral Key Pinch strength: Left: 5#  12/02/23: L: 5 lbs, R: 98 # Lateral pinch: Left: 5 lbs   12/31/23:   Grip strength: Right: 105#, Left: 14#,   COORDINATION: Box and Blocks: Right: 34 blocks completed in 1 min., Left: 0 blocks completed in 1 min.   10/29/23:  Box and Blocks: Right: 34 blocks completed in 1 min., Left: 2 blocks completed in 1 min.  9 Hole Peg Test: Left: To remove 9 pegs from a vertical position: 1 min. &13 sec.  12/02/23: Box and Blocks: 4 blocks 12/02/23: 4 pegs out in 3 min (multiple pulled and dropped out of the cup)   12/02/23: Box and Blocks Test: 5 blocks in 1 min.  FUGL-MEYER ASSESSMENT   A.  Upper Extremity   I. Reflex Activity:   12/12/23   Flexors (biceps) 2   Extensors (triceps) 2   Total 4/4                 II. Volitional Movement within Synergies:   12/12/23   Flexor Synergy:      Shoulder Elevation 2   Shoulder Retraction 2   Shoulder Abduction (at least 90*) 2   External Rotation 1   Elbow Flexion 2   Forearm Supination 1   Extensor Synergy:      Shoulder Adduction/Internal Rotation 2   Elbow Extension 1   Forearm Pronation 2   Total 15/18     III. Movement Combining Synergies:   12/12/23   Hand to Lumbar Spine 1   Shoulder Flexion at 90*, elbow at 0* 0   Pron/Sup w/elbow at 90* and shoulder at 0* 1   Total 2/6     IV. Movement Out of Synergy   12/12/23   Shoulder Abduction to 90*, elbow at 0*, forearm pronated 0   Shoulder Flexion 9)-180*, elbow at 0*, forearm in mid position 0   Pron/sup, elbow at 0* and shoulder between 30-90* of flexion 0   Total 0/6       B. Wrist   12/12/23   Stability of wrist at 15* ext, elbow at 90*, shoulder 0* 2   Repeated flex/ext, elbow at 90*, shoulder 0* 1   Stability of wrist at 15* ext, elbow at 0*, shoulder 30* 0   Flex/ext, elbow 0*, shoulder 30* 0   Circumduction  1   Total 4/10     C. Hand   12/12/23   Mass Flexion 1   Mass Extension 1   Total 2/4       D. Grasp   12/12/23   Hook  0   Thumb Adduction - paper 1   Pincer - pen 0   Cylindrical - cup/can  0   Spherical - tennis ball 1   Total 2/10     E. Coordination/Speed   12/12/23   Tremor 2   Dysmetria 0   Time 1   Total 3/6     FUGL-MEYER ASSESSMENT: UPPER EXTREMITY A-E TOTAL on (date): 32/66   SENSATION: Sensation feels different in both hands.  L hand Light touch- impaired L hand Proprioception- impaired  EDEMA: Has had hx of edema, and came to Eval session with swelling in the L hand/wrist/forearm.   *pt. has a laceration from his dog on the dorsal aspect of the L hand.   MUSCLE TONE: Increased tone through the UE and hand flexors.    COGNITION: Overall cognitive status: Within functional limits for tasks assessed  VISION: Subjective report: L sided inattention Baseline vision: Wears glasses for reading only Visual history: Right visual occlusion  VISION ASSESSMENT: To be assessed  PERCEPTION: TBD  PRAXIS: Impaired                                                                                                                 TREATMENT DATE: 12/31/2023  Measurements were obtained and goals were reviewed for the insurance update  Therapeutic Activity:   -Pt. tolerated ROM to the LUE UE through all joint ranges of the LUE in preparation for self-grooming tasks. -Facilitated diagonal patterns of movement with the LUE in preparation for self-grooming tasks. -Facilitated reps of LUE functional reaching for Saebo rings, and moving them through 2 levels of horizontal rungs placed at progressively higher heights with support required proximally at the LUE.    Education: Education details: Neuro re-ed to [employer] Person educated: Patient and spouse Education method: Leisure centre manager cues Education comprehension: verbalized understanding, further training needed  HOME EXERCISE PROGRAM: -grasping and stacking 1, and 1/2 cubes form the tabletop surface.  GOALS: Goals reviewed with patient? Yes  SHORT TERM GOALS: Target date: 02/11/2024   Pt. Will be independent with HEP for the LUE.  Baseline: 12/31/23: Independent; ongoing 12/03/23: indep with current HEP, though ongoing progressions are being made as UE function improves; 10/29/23: Pt. requires assist from wife with HEPs. Pt. Is consistently working on HEP's at home. 09/19/23: Requires assist from his wife Eval; no current HEP Goal status: Achieved,Ongoing  LONG TERM GOALS: Target date: 03/24/2024  Pt. Will increase L shoulder flexion by 10 degrees to be able to reach up to shelves.  Baseline: 12/31/23: Left: 95 degrees before scaption, 110 of scaption  12/03/23: 90* before onset of scaption; 10/29/23: 111(115) 09/19/23: Pt. Continues to be limited with reaching up to shelves. Eval: L shoulder flexion is 89 (110). Goal status: progressing, Ongoing  2.  Pt. Will increase L shoulder abduction by 10 degrees to assist with underarm hygiene.  Baseline: 12/31/23: Left: 105(118) 12/03/23: 100*; pt able to demo bilat underarm hygiene with set up; 10/29/23: 888(877) 09/19/23: Pt. Continues to require assist with underarm hygiene.Eval: L shoulder abduction is 98 (120).  Goal status: Progressing, ongoing  3.  Pt. Will improve  L wrist extension by 10 degrees to be able to initiate anticipation of grasping for objects.  Baseline: 12/31/23: 30(45) 12/03/23: 30* (45); 10/29/23: 20(55) 09/19/23: Consistent activation noted in the left wrist extensors with facilitation. Eval: -48 (42) Goal status: Ongoing  4.  Pt. Will improve active gross digit extension through 50% of the range to be able to release objects in his hand consistently.  Baseline:12/31/23: Gross digit extension: ~30-40% 12/03/23: Gross digit ext through ~30% of range (limited by flexor tone in the PIPs); 10/28/20: Gross digit extension through 25-50% of the range. 09/19/23: gross digit extension noted through 25% range with facilitation.Eval: gross digit extension is 10% of the range  Goal status: Ongoing  5.  Pt. Will independently demonstrate visual compensatory strategies when navigating through environment and during tabletop tasks. Baseline: 12/03/23: Pt demonstrates consistent head turns to scan to L visual field without cueing; 10/29/2023: Pt. Continues to less cuing for left sided awareness. 09/19/23: Pt. requires fewer cues for left sided awareness Eval: Pt. Requires consistent cuing for L sided awareness.  Goal status: achieved   6. Pt. Will improve left hand [facility] skills to be able to independently manipulate small objects during ADLs, and IADLs. Baseline: 12/31/23: Pt. Is r=progressing to be able to grasp  progressively smaller objects. 12/03/23: Pt removed 3 pegs in >3 min, pulling out more than 3 but repeatedly dropping to floor or table top rather than assessment dish; 10/29/23: 9 Hole Peg Test: Pt. Was able to remove 9 pegs from vertical position on the pegboard in 1 min. & 13 sec. Pt. Is not yet able to pick up pegs from a horizontal position to place them in the pegboard.   Goal status: ongoing   7. Pt will improve left hand function skills as evidenced by improved score to 4 blocks on the Box and Blocks test. Baseline: 12/31/23: 4 blocks in 1 min. 12/03/23: 4 blocks after 3 trials and passive wrist and digit stretching between trials (not yet consistent to achieve 4 blocks); 10/29/23: Box and Blocks Test: 2 Blocks completed in 1 min.   Goal Status: ongoing   ASSESSMENT:  CLINICAL IMPRESSION:     PERFORMANCE DEFICITS: in functional skills including ADLs, IADLs, coordination, dexterity, proprioception, sensation, edema, tone, ROM, strength, pain, Fine motor control, Gross motor control, mobility, balance, endurance, vision, and UE functional use, cognitive skills including perception, and psychosocial skills including coping strategies, environmental adaptation, and routines and behaviors.   IMPAIRMENTS: are limiting patient from ADLs, IADLs, and leisure.   CO-MORBIDITIES: may have co-morbidities  that affects occupational performance. Patient will benefit from skilled OT to address above impairments and improve overall function.  MODIFICATION OR ASSISTANCE TO COMPLETE EVALUATION: Min-Moderate modification of tasks or assist with assess necessary to complete an evaluation.  OT OCCUPATIONAL PROFILE AND HISTORY: Detailed assessment: Review of records and additional review of physical, cognitive, psychosocial history related to current functional performance.  CLINICAL DECISION MAKING: Moderate - several treatment options, min-mod task modification necessary  REHAB POTENTIAL: Good  EVALUATION  COMPLEXITY: Moderate  PLAN:  Measurements were obtained, and goals were reviewed with the Pt. Pt. has made steady progress over this recertification period. Pt. has progressed with Left shoulder ROM, and has improved with functional reaching. Pt. has progressed with active left elbow extension, bilateral grip strength,  and left hand [facility] skills. Pt. is now engaging his hand more during daily ADL tasks at home, and is now able to use his left hand to engage in opening cheese slice  wrappers when making a sandwich. Pt. continues to present with Left UE weakness which limits his ability to complete daily ADL, and IADL tasks. Pt. is in the process of pursuing [employer], and has a meeting with the representatives this week. Pt continues to benefit from OT services to work on improving LUE functioning to increase engagement of the LUE during daily tasks, and to provide education about compensatory strategies during ADLs/IADLs.   OT FREQUENCY: 2x/week  OT DURATION: 12 weeks  PLANNED INTERVENTIONS: 97168 OT Re-evaluation, 97535 self care/ADL training, 02889 therapeutic exercise, 97530 therapeutic activity, 97112 neuromuscular re-education, 97140 manual therapy, 97018 paraffin, 02989 moist heat, 97010 cryotherapy, 97034 contrast bath, 97760 Orthotic Initial, 97763 Orthotic/Prosthetic subsequent, passive range of motion, visual/perceptual remediation/compensation, energy conservation, and DME and/or AE instructions  RECOMMENDED OTHER SERVICES: PT and ST  CONSULTED AND AGREED WITH PLAN OF CARE: Patient and family member/caregiver  PLAN FOR NEXT SESSION: see above   Richardson Otter, MS, OTR/L   12/31/2023, 5:09 PM

## 2023-12-31 NOTE — Therapy (Signed)
 OUTPATIENT PHYSICAL THERAPY TREATMENT / RECERT / PROGRESS NOTE  Physical Therapy Progress Note   Dates of reporting period  11/26/2023   to   12/31/2023   Patient Name: [name]. MRN: [id number] DOB:19-Sep-1950, 73 y.o., male Today's Date: 12/31/2023 PCP: Dennise Remak, MD  REFERRING PROVIDER:  Pegge Toribio PARAS, PA-C  END OF SESSION:    PT End of Session - 12/31/23 1546     Visit Number 40    Number of Visits 64    Date for Recertification  03/24/24    Authorization Type [employer] Medicare    Authorization Time Period [facility] auth#: [id number] for 2 PT vst from 9/30-10/28    Authorization - Visit Number 2    Authorization - Number of Visits 2    Progress Note Due on Visit 40    PT Start Time 1535    PT Stop Time 1619    PT Time Calculation (min) 44 min    Equipment Utilized During Treatment Gait belt    Activity Tolerance Patient tolerated treatment well;No increased pain    Behavior During Therapy WFL for tasks assessed/performed              Past Medical History:  Diagnosis Date   Anemia    Cervical myelopathy (HCC)    Chronic venous insufficiency    [name] (chronic kidney disease), stage III (HCC)    Diabetes mellitus without complication (HCC)    GERD (gastroesophageal reflux disease)    Hypercholesteremia    Hypothyroidism    Kidney stones    about 50 in the past--last in March 4/24   Leg weakness, bilateral    due to Myasthenia Gravis   Myasthenia gravis (HCC)    Spinal stenosis    Vitamin B 12 deficiency    Vitamin B 12 deficiency    Wears hearing aid    bilateral   Past Surgical History:  Procedure Laterality Date   BACK SURGERY  09/2015   CARDIAC CATHETERIZATION     CATARACT EXTRACTION W/ INTRAOCULAR LENS IMPLANT     CATARACT EXTRACTION W/PHACO Right 12/21/2015   Procedure: CATARACT EXTRACTION PHACO AND INTRAOCULAR LENS PLACEMENT (IOC);  Surgeon: Dene Etienne, MD;  Location: [facility];  Service: Ophthalmology;  Laterality:  Right;  DIABETIC - insulin  and oral meds   COLONOSCOPY     SHOULDER SURGERY     labrum repair   TONSILLECTOMY     Patient Active Problem List   Diagnosis Date Noted   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) 10/11/2023   High blood pressure 08/13/2023   Anemia 08/13/2023   Constipation 08/13/2023   Myasthenia gravis (HCC) 08/13/2023   Acute ischemic right MCA stroke (HCC) 07/19/2023   Lymphedema 12/27/2016   Leg pain 07/15/2016   Chronic venous insufficiency 07/15/2016   Swelling of limb 07/15/2016   Type 2 diabetes mellitus (HCC) 07/15/2016   Hyperlipidemia 07/15/2016    ONSET DATE: 07/19/23  REFERRING DIAG:  P36.488 (ICD-10-CM) - Cerebral infarction due to unspecified occlusion or stenosis of right middle cerebral artery   THERAPY DIAG:  Hemiplegia and hemiparesis following cerebral infarction affecting left non-dominant side (HCC)  Muscle weakness (generalized)  Other lack of coordination  Difficulty in walking, not elsewhere classified  Unsteadiness on feet  Abnormality of gait and mobility  Rationale for Evaluation and Treatment: Rehabilitation  SUBJECTIVE:  SUBJECTIVE STATEMENT:   Pt states he walked around the house to get to/from his bedroom using his walker to go change clothes. Pt states he hasn't yet had any near misses or LOB during ambulation in the home. Pt states his L hand grasp on the walker is the most challenging barrier due to inability to sustain grasp, which prevents him from being able to use that hand support for his balance. Pt states he saw his stroke doctor at [facility] yesterday and he discussed the benefits of continued physical therapy. Pt reports no other significant updates from that appointment.  Denies pain.     PERTINENT HISTORY:  CVA with Left hemiplegia in April 2025. Pt started on a swedish knee cage at CIR due to uncontrolled hyperextension in stance. Pt had not been moving much since discharging from [facility], as therapists instructed pt to perform transfers only at home due to high fall risk with ambulation. Continues to have significant weakenss in the LUE with difficulty extending fingers as well as numbness and inattention to the L side of body resulting in multiple cuts on Arm and leg with WC mobility in home. Will be attaining custom power WC from [employer]. Has loaner currently.   PAIN:  Are you having pain? No  PRECAUTIONS: Fall, history of wounds on LLE   *Latex allergy   WEIGHT BEARING RESTRICTIONS: No  FALLS: Has patient fallen in last 6 months? Yes. Number of falls 1  LIVING ENVIRONMENT: Lives with: lives with their spouse Lives in: House/apartment Stairs: Ramps installed while in the Hospital  Has following equipment at home: Vannie - 2 wheeled and Wheelchair (power)  PLOF: Independent with basic ADLs, Independent with household mobility without device, and Independent with community mobility with device with [facility]   PATIENT GOALS: relearn to Walk 15-29ft.   OBJECTIVE:                                                                                                                              TREATMENT DATE: 12/31/2023  Pt arrives in transport chair.   Therapy session focused on re-assessment of standardized outcome measures and subjective questionnaire to determine pt's progress with therapy thus far.    Stroke Impact Scale 16 (Copyrighted instrument, [facility])  In the past 2 weeks, how difficult was it to...  Rating Scale 5 = Not difficult at all 4 = A little difficult 3 = Somewhat difficult 2 = Very difficult 1 = Could not do at all  a. Dress the top part of your body? 4  b. Bathe yourself? 4  c. Get to the toilet on time? 5  d. Control  your bladder (not have an accident)? 5  e. Control your bowels (not have an accident)? 5  f. Stand without losing balance? 3  g. Go shopping? 1  h. Do heavy household chores (e.g. vacuum, laundry or  yard work)? 1  i. Stay sitting without losing  your balance? 5  j. Walk without losing your balance? 4  k. Move from a bed to a chair? 4  l. Walk fast? 2  m. Climb one flight of stairs? 3  n. Walk one block? 1  o. Get in and out of a car? 5  p. Carry heavy objects (e.g. bag of groceries) with your  affected hand? 1  Sum:  53/80  MDC (Minimal Detectable Change) is >/=8    Donned pt's L LE SKC to use during session. Noticed the strap around his lower leg is still a little tight today due to increased edema - pt and wife previously stated they have been continuing the recommended edema management strategies from the wound care clinic (primarily elevating his LEs)  Patient participated in [place] Balance Test and demonstrates increased fall risk as noted by score of  21/56.  (<36= high risk for falls, close to 100%; 37-45 significant >80%; 46-51 moderate >50%; 52-55 lower >25%). Details below.   [facility] PT Assessment - 12/31/23 0001       Berg Balance Test   Sit to Stand Able to stand using hands after several tries    Standing Unsupported Able to stand 2 minutes with supervision    Sitting with Back Unsupported but Feet Supported on Floor or Stool Able to sit safely and securely 2 minutes    Stand to Sit Controls descent by using hands    Transfers Needs one person to assist   min A without AD during stand pivot   Standing Unsupported with Eyes Closed Able to stand 10 seconds with supervision   would benefit from continued balance activities with eyes closed as pt has posterior LOB with fatigue   Standing Unsupported with Feet Together Needs help to attain position but able to stand for 30 seconds with feet together   able to stand after assistance placing feet in NBOS   From Standing,  Reach Forward with Outstretched Arm Reaches forward but needs supervision    From Standing Position, Pick up Object from Floor Unable to pick up and needs supervision   able to reach forward and pick-up cone from ground with min A for guarding L knee and balance - but with supervision is able to initiate bending forward   From Standing Position, Turn to Look Behind Over each Shoulder Needs assist to keep from losing balance and falling   requires heavy min A for balance with min turning towards each direction   Turn 360 Degrees Needs assistance while turning    Standing Unsupported, Alternately Place Feet on Step/Stool Needs assistance to keep from falling or unable to try   with min A for balance, no UE support, able to tap L foot on step 4x   Standing Unsupported, One Foot in Front Able to take small step independently and hold 30 seconds    Standing on One Leg Unable to try or needs assist to prevent fall   lifts leg, unable to hold 3sec, but regains balance with only CGA   Total Score 21           10 Meter Walk Test: Patient instructed to walk 10 meters (32.8 ft) as quickly and as safely as possible at their normal speed x2 and at a fast speed x2. Time measured from 2 meter mark to 8 meter mark to accommodate ramp-up and ramp-down.  Normal speed 1: 0.315 m/s (31.69 seconds) Normal speed 2: 0.34 m/s (29.08 seconds) Average Normal speed: 0.3275 m/s  using bari-RW with CGA/light min A for steadying Cut off scores: <0.4 m/s = household Ambulator, 0.4-0.8 m/s = limited community Ambulator, >0.8 m/s = community Ambulator, >1.2 m/s = crossing a street, <1.0 = increased fall risk MCID 0.05 m/s (small), 0.13 m/s (moderate), 0.06 m/s (significant)  (ANPTA Core Set of Outcome Measures for Adults with Neurologic Conditions, 2018)   Participated in Timed Up and Go (TUG): 1st trial: 43.82 seconds 2nd trial: 43.94 seconds  Average: 43.88 seconds using bari-RW with primarily CGA and occasional light  min A for steadying - allow pt to set-up L hand on hand splint prior to initiating test Patient demonstrates high fall risk as indicated by requiring >13.5seconds to complete the TUG.    Throughout session, pt is demonstrating excellent progress with his gait training showing improved management of AD, L LE gait mechanics, and balance with the goal to at least reach a functional modified independent ambulatory level in the home environment.   PATIENT EDUCATION: Education details: knee cage needs assessmnet as it is causeing crossover gait.  Person educated: Patient and Spouse Education method: Explanation, VC/TC/Demo Education comprehension: verbalized understanding  HOME EXERCISE PROGRAM: Access Code: [email or web address] URL: [email or web address] Date: 09/11/2023 Prepared by: Massie Dollar  Exercises - Seated Knee Extension AROM  - 1 x daily - 4 x weekly - 3 sets - 10 reps - Seated Hip Abduction  - 1 x daily - 4 x weekly - 3 sets - 10 reps - Seated March  - 1 x daily - 4 x weekly - 3 sets - 10 reps - Sit to Stand with Counter Support  - 1 x daily - 4 x weekly - 3 sets - 10 reps - Supine Short Arc Quad  - 1 x daily - 4 x weekly - 3 sets - 10 reps - Supine Hip Abduction  - 1 x daily - 4 x weekly - 3 sets - 10 reps - Supine Bridge  - 1 x daily - 4 x weekly - 3 sets - 3 reps - 2sec  hold  12/17/2023: verbal instruction to complete repeated sit<>stands with education on safe set-up provided  GOALS: Goals reviewed with patient? Yes  SHORT TERM GOALS: Target date: 02/11/2024  Patient will be independent in home exercise program to improve strength/mobility for better functional independence with ADLs. Baseline: Initiated on 09/11/2023 10/22/2023: will update when appropriate 11/26/2023: patient participating in transfer and gait practice in controlled environment with 2 person assistance at home in addition to HEP 12/31/2023: patient participating in gait using bari-RW at home with  family assistance Goal status: IN PROGRESS  LONG TERM GOALS: Target date: 03/24/2024  Patient will increase SIS-16 score to equal to or greater than 10points to demonstrate statistically significant improvement in mobility and quality of life.  Baseline: 49/80 09/19/2023: 45/80 10/22/2023: 46/80 11/26/2023: 47/80 12/31/2023: 53/80 Goal status: IN PROGRESS  2.  Patient (> 68 years old) will complete five times sit to stand test in < 15 seconds indicating an increased LE strength and improved balance. Baseline: 1:65min 09/19/2023: 56.39 seconds using R UE support to push-up from armrest of green chair and requiring skilled min A for balance and lifting/lowering  10/22/2023: 27.10 seconds using R UE support to push-up from armrest  8/12: 27.7 sec with RUE pushing from arm rest.  11/26/2023: 37.42 seconds using R UE support to push-up from armrest of green chair and not using L UE support with min-modA for facilitating anterior weight shift and lifting to come to stand  secondary to fatigue (would benefit from re-testing at next session) 12/17/2023: 24.59 seconds using R UE support to push-up from armrest of green chair and not using L UE support with CGA/light min A for safety/steadying  Goal status: IN PROGRESS  3.  Patient will increase FIST score by > 6 points to demonstrate decreased fall risk during functional activities Baseline:  43/56  09/23/2023: 53/56 Goal status: MET and UPGRADED  09/23/2023: Patient will increase Berg Balance score to > 45/56 to demonstrate improved balance and decreased fall risk during functional activities and ADLs.  Baseline: 09/23/2023: 14/56 7/31: 20/56 using RW support 11/26/2023: 17/56 without AD support 12/31/2023: 21/56 without AD support Goal status: IN PROGRESS  4.  Patient will increase 10 meter walk test to >0.20m/s as to improve gait speed for better community ambulation and to reduce fall risk. Baseline: 09/09/2023: 0.103 m/s ( and 37 seconds) using  bari-RW with skilled heavy min A and +2 w/c follow for safety  09/19/2023: 0.19m/s using bari-RW with skilled min A of 1 and +2 w/c follow for safety 10/22/2023: 0.1875 m/s using bari-RW with L hand splint, L swedish knee cage, and only skilled light min A of 1 (no wheelchair follow)  8/12: 0.158m/s with RW, and L hand splint. No knee cage on this day.  11/26/2023: Average Normal speed: 0.20 m/s & Average Fast speed: 0.275 m/s using bari-RW with CGA/min A for balance wearing [facility] 12/17/2023: Average Normal speed: 0.32 m/s using bari-RW with CGA & Average Fast speed: 0.36 m/s using bari-RW requiring CGA/light min A  12/31/2023: Average Normal speed: 0.3275 m/s using bari-RW with CGA/light min A for steadying Goal status: IN PROGRESS  5.  Patient will reduce timed up and go to <11 seconds to reduce fall risk and demonstrate improved transfer/gait ability. Baseline: unable to complete turn at 10 ft. Mod-max assist due to poor control of LW on RW, resulting in L lateral LOB  09/09/2023: and 32 seconds using bari-RW with L hand splint and +2 on R side for safety, therapist providing skilled min A on L side (Had pt set-up L hand on RW splint, get L foot in proper positioning, and scoot forward in seat before starting the timer) 09/19/2023: 58min43 seconds using bari-RW with only skilled min A of 1 (Had pt set-up L hand on RW splint, get L foot in proper positioning, and scoot forward in seat before starting the timer) 7/2: 78sec(1:18.125) with RW and min assist overall.  10/22/2023: 1 min, 30 seconds using bari-RW with L hand splint and skilled light min assist  8/12: 1:01 min with RW and L hand splint.  11/26/2023: 51.57 seconds using bari-RW with hand splint, wearing SKC, and CGA-minA for balance 12/31/2023: 43.88 seconds using bari-RW with hand splint, wearing SKC, and CGA/occasional light min A   Goal status: IN PROGRESS   ASSESSMENT:  CLINICAL IMPRESSION:    Patient arrives motivated to participate in  therapy session. Therapy session focused on re-assessment of standardized outcome measures and subjective questionnaire to determine pt's progress with therapy thus far with plan to submit for re-certification to extend his POC because pt continues to demonstrate improvements towards his LTGs and goal of reaching a mod-I ambulatory functional mobility level. Patient demonstrates significant improvement on Berg Balance Test with an improvement by 4 points with pt demonstrating ability to reach further outside his BOS, stand with semi-tandem stance, and perform sit to stand transfers without hands on assistance. Patient also demonstrates ability to have sustained his  recent gait speed improvements, indicating with continued focus on HIGT (hight intensity gait training) on the treadmill, anticipate pt will continue to make improvements and be able to sustain them. Patient demonstrates improvement with performing functional transfers and gait on the TUG with a significant improvement in his time by 8 seconds! And overall patient has progressed to a primarily CGA level for transfers and gait using bari-RW with the goal of continued progression towards a mod-I functional mobility level. Pt will continue to benefit from skilled physical therapy intervention to address impairments, improve QOL, and attain therapy goals. Patient's condition has the potential to improve in response to therapy. Maximum improvement is yet to be obtained. The anticipated improvement is attainable and reasonable in a generally predictable time.      OBJECTIVE IMPAIRMENTS: Abnormal gait, cardiopulmonary status limiting activity, decreased activity tolerance, decreased balance, decreased cognition, decreased coordination, decreased endurance, decreased knowledge of condition, decreased knowledge of use of DME, decreased mobility, difficulty walking, decreased ROM, decreased strength, decreased safety awareness, dizziness, hypomobility,  increased fascial restrictions, impaired perceived functional ability, increased muscle spasms, impaired sensation, impaired tone, impaired UE functional use, impaired vision/preception, improper body mechanics, postural dysfunction, and obesity.   ACTIVITY LIMITATIONS: carrying, lifting, bending, sitting, standing, squatting, stairs, transfers, bed mobility, bathing, toileting, dressing, reach over head, hygiene/grooming, locomotion level, and caring for others  PARTICIPATION LIMITATIONS: meal prep, cleaning, laundry, medication management, interpersonal relationship, driving, shopping, community activity, and yard work  PERSONAL FACTORS: Age, Past/current experiences, Time since onset of injury/illness/exacerbation, and 3+ comorbidities: MG, HTN, CVA, lymphedema are also affecting patient's functional outcome.   REHAB POTENTIAL: Good  CLINICAL DECISION MAKING: Unstable/unpredictable  EVALUATION COMPLEXITY: High  PLAN:  PT FREQUENCY: 1-2x/week  PT DURATION: 8 weeks  PLANNED INTERVENTIONS: 97164- PT Re-evaluation, 97750- Physical Performance Testing, 97110-Therapeutic exercises, 97530- Therapeutic activity, [id number]- Neuromuscular re-education, 97535- Self Care, [id number]- Manual therapy, [id number]- Gait training, [phone]- Orthotic Initial, [phone]- Orthotic/Prosthetic subsequent, [phone]- Canalith repositioning, [id number]- Electrical stimulation (unattended), [phone]- Electrical stimulation (manual), [id number]- Wound care (first 20 sq cm), 97598- Wound care (each additional 20 sq cm), Patient/Family education, Balance training, Stair training, Taping, Dry Needling, Joint mobilization, Joint manipulation, Vestibular training, Visual/preceptual remediation/compensation, Cognitive remediation, DME instructions, Wheelchair mobility training, Cryotherapy, and Moist heat  PLAN FOR NEXT SESSION:   Follow-up on addition of STS to HEP Continue stair navigation training HIGT (high intensity gait training) using litegait  harness either on treadmill vs overground Decreasing reliance on R UE support Dynamic gait training using bari-RW vs litegait Backwards Side stepping Obstacle navigation (turning) Working on forward propulsion with increased L hip extension and forward progression of pelvis over L stance phase Dynamic standing balance of trunk rotation Continue monitoring SKC fit & see if posterior knee strap needs to be adjusted   Connell Kiss, PT, DPT, NCS, CSRS Physical Therapist - [facility]  4:33 PM 12/31/23

## 2023-12-31 NOTE — Addendum Note (Signed)
 Addended by: Roby Donaway M on: 12/31/2023 10:05 PM   Modules accepted: Orders

## 2024-01-01 ENCOUNTER — Encounter: Admitting: Physician Assistant

## 2024-01-01 NOTE — Addendum Note (Signed)
 Addended by: Azavion Bouillon M on: 01/01/2024 08:05 AM   Modules accepted: Orders

## 2024-01-02 ENCOUNTER — Ambulatory Visit

## 2024-01-02 ENCOUNTER — Ambulatory Visit: Admitting: Physical Therapy

## 2024-01-02 ENCOUNTER — Encounter: Admitting: Speech Pathology

## 2024-01-02 DIAGNOSIS — I69354 Hemiplegia and hemiparesis following cerebral infarction affecting left non-dominant side: Secondary | ICD-10-CM

## 2024-01-02 DIAGNOSIS — M6281 Muscle weakness (generalized): Secondary | ICD-10-CM

## 2024-01-02 DIAGNOSIS — R278 Other lack of coordination: Secondary | ICD-10-CM

## 2024-01-03 NOTE — Therapy (Signed)
 Outpatient Occupational Therapy Neuro Treatment Note  Patient Name: [name]. MRN: [id number] DOB:10/27/1950, 73 y.o., male Today's Date: 01/03/2024  PCP: Alm Rower, MD REFERRING PROVIDER: Hermann Toribio PARAS, PA-C  END OF SESSION:  OT End of Session - 01/03/24 1105     Visit Number 39    Number of Visits 48    Date for Recertification  01/21/24    OT Start Time 1315    OT Stop Time 1400    OT Time Calculation (min) 45 min    Activity Tolerance Patient tolerated treatment well    Behavior During Therapy WFL for tasks assessed/performed          Past Medical History:  Diagnosis Date   Anemia    Cervical myelopathy (HCC)    Chronic venous insufficiency    [name] (chronic kidney disease), stage III (HCC)    Diabetes mellitus without complication (HCC)    GERD (gastroesophageal reflux disease)    Hypercholesteremia    Hypothyroidism    Kidney stones    about 50 in the past--last in March 4/24   Leg weakness, bilateral    due to Myasthenia Gravis   Myasthenia gravis (HCC)    Spinal stenosis    Vitamin B 12 deficiency    Vitamin B 12 deficiency    Wears hearing aid    bilateral   Past Surgical History:  Procedure Laterality Date   BACK SURGERY  09/2015   CARDIAC CATHETERIZATION     CATARACT EXTRACTION W/ INTRAOCULAR LENS IMPLANT     CATARACT EXTRACTION W/PHACO Right 12/21/2015   Procedure: CATARACT EXTRACTION PHACO AND INTRAOCULAR LENS PLACEMENT (IOC);  Surgeon: Dene Etienne, MD;  Location: [facility];  Service: Ophthalmology;  Laterality: Right;  DIABETIC - insulin  and oral meds   COLONOSCOPY     SHOULDER SURGERY     labrum repair   TONSILLECTOMY     Patient Active Problem List   Diagnosis Date Noted   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) 10/11/2023   High blood pressure 08/13/2023   Anemia 08/13/2023   Constipation 08/13/2023   Myasthenia gravis (HCC) 08/13/2023   Acute ischemic right  MCA stroke (HCC) 07/19/2023   Lymphedema 12/27/2016   Leg pain 07/15/2016   Chronic venous insufficiency 07/15/2016   Swelling of limb 07/15/2016   Type 2 diabetes mellitus (HCC) 07/15/2016   Hyperlipidemia 07/15/2016   ONSET DATE: 07/13/2023  REFERRING DIAG: Acute ischemic R MCA CVA  THERAPY DIAG: muscle weakness (generalized), other lack of coordination, vision disturbance, hemiplegia and hemiparesis following cerebral infarction affecting left non-dominant side (HCC)  Rationale for Evaluation and Treatment: Rehabilitation  SUBJECTIVE:  SUBJECTIVE STATEMENT: Pt reports being receptive to recommendation to wearing Softpro resting hand splint at night time d/t increased spasticity in L hand digits during the day. Pt accompanied by: [name]  PERTINENT HISTORY: Pt. Was admitted to [facility] on 07/13/2023 after sustaining a CVA while on a trip to the beach. Pt. Was diagnosed with R MCA CVA. Pt. Was admitted to inpatient rehab from 07/19/2023-08/13/2023. Past medical history includes high BP, anemia, and Myasthenia Gravis.   PRECAUTIONS: None  WEIGHT BEARING RESTRICTIONS: No  PAIN: 01/02/24: No pain  FALLS: Has patient fallen in last 6 months? Yes  LIVING ENVIRONMENT: Lives with: lives with their family and lives with their spouse-  Lives in: House/apartment- 2 story home and resides mostly on the 1st floor Stairs: No- Ramped entrance Has following equipment at home:  Walker, cane, Steadi, shower chair, handheld shower head, bedside commode  PLOF: Independent and Independent with basic ADLs Independent at home   PATIENT GOALS: Would like to walk 10-20 feet each time.   OBJECTIVE:  Note: Objective measures were completed at Evaluation unless otherwise noted.  HAND DOMINANCE: Right  ADLs: Overall ADLs:  Transfers/ambulation related to ADLs: Eating: Independent, 100% R handed Grooming: independent UB Dressing: Can put on shirt independently LB Dressing: Difficulty with  pants and requires assistance getting the pants on his feet, has difficulty putting on shoes and socks Toileting: Tot A for toilet hygiene. Uses Steadi during toilet transfers. Bathing: Requires assist with using the L arm to wash the R arm. Tub Shower transfers: Roll in shower, built in shower chair, grab bars and hand held shower head.  IADLs: Shopping: Total A (Wife typically did the shopping prior to onset)   Light housekeeping: Total A (wife typically performs prior to onset) Meal Prep: Independent with light snack prep Community mobility: No driving, able to get in/out of the car Medication management: Set up assistance from his wife using a pill box (pt. Is responsible for taking medications at the correct time) Financial management: family members check behind him. Handwriting: TBD Hobbies: gardening, wood working and sports- [employer] Work history: Owns a tax business  MOBILITY STATUS: Needs Assist:    POSTURE COMMENTS:   Sitting balance: Good supported sitting balance  FUNCTIONAL OUTCOME MEASURES:  Fugl-Meyer: see below  UPPER EXTREMITY ROM:    Active ROM Right Eval  [facility] Right 09/23/2023 [facility] Left eval Left 09/23/2023 Left 10/29/23 Left 12/02/23 Left 12/31/23  Shoulder flexion   89(110) 109(114) 111(115) 90 before scaption (110) 95 before scaption (110)  Shoulder abduction   98(120) 109(120) 111(122) 100 (110) 105(118)  Shoulder adduction         Shoulder extension         Shoulder internal rotation         Shoulder external rotation         Elbow flexion   120(140) 130(136) 141(145)  146  Elbow extension   -28(-10) -5(0) -18(0)  -3(0)  Wrist flexion   60(60) Rest at 60 50(65) 54(60)    Wrist extension   -48(42) 10(40) 20(50) 30 (45) 30(45)  Wrist ulnar deviation     12(22)    Wrist radial deviation     10(20)    Wrist pronation     (82)90    Wrist supination     64(74) 70 (75)   (Blank rows = not tested)  Eval: L Digit flexion: 2nd:3cm (0cm) 3rd: 0cm (0cm),  4th: 0cm (0cm), 5th: 2cm (0cm)  09/23/23:  L Digit flexion to [facility]: 2nd: 3.5cm (0cm), 3rd: 4 cm(0cm), 4th 3.5cm (0cm), 5th: 2cm (0cm)  Eval:  L Digit extension: Active Gross digit extension through 10% of ROM  09/23/23:  L Digit extension: Active Gross digit extension through 50% of ROM  10/29/23:  L Digit extension: Active Gross digit extension through 25-50% of ROM  12/02/23:  L digit extension: Active gross ext through 30%   12/31/23:  L digit extension:  Active gross ext through ~30-40%   UPPER EXTREMITY MMT:     MMT Right eval Left eval Left 09/23/2023 Left 10/29/23 Left 12/02/23 Left 12/31/23  Shoulder flexion 5 3- 3-/5 3-/5  3-/5  Shoulder abduction 5 3- 3-/5 3-/5  3-5/  Shoulder adduction        Shoulder extension  Shoulder internal rotation        Shoulder external rotation        Middle trapezius        Lower trapezius        Elbow flexion 5 TBD 3+/5 4-/5 4+/5 4+/5  Elbow extension 5 TBD 4-/5 3-/5 4+/5 4+/5  Wrist flexion   2/5 3-/5 3-   Wrist extension  2- 2/5 2+/5 3- 3-/5  Wrist ulnar deviation        Wrist radial deviation        Wrist pronation    3/5 3+   Wrist supination    3-/5 3-   (Blank rows = not tested)  HAND FUNCTION:  Eval:  Grip strength: Right: 94#, Left: 1#,  Lateral Key Pinch strength: Right: 14#, Left: 3#  10/29/23:  Grip strength: Left: 16# Lateral Key Pinch strength: Left: 5#  12/02/23: L: 5 lbs, R: 98 # Lateral pinch: Left: 5 lbs   12/31/23:   Grip strength: Right: 105#, Left: 14#,   COORDINATION: Box and Blocks: Right: 34 blocks completed in 1 min., Left: 0 blocks completed in 1 min.   10/29/23:  Box and Blocks: Right: 34 blocks completed in 1 min., Left: 2 blocks completed in 1 min.  9 Hole Peg Test: Left: To remove 9 pegs from a vertical position: 1 min. &13 sec.  12/02/23: Box and Blocks: 4 blocks 12/02/23: 4 pegs out in 3 min (multiple pulled and dropped out of the cup)   12/02/23: Box and Blocks Test: 5  blocks in 1 min.  FUGL-MEYER ASSESSMENT   A. Upper Extremity   I. Reflex Activity:   12/12/23   Flexors (biceps) 2   Extensors (triceps) 2   Total 4/4                 II. Volitional Movement within Synergies:   12/12/23   Flexor Synergy:      Shoulder Elevation 2   Shoulder Retraction 2   Shoulder Abduction (at least 90*) 2   External Rotation 1   Elbow Flexion 2   Forearm Supination 1   Extensor Synergy:      Shoulder Adduction/Internal Rotation 2   Elbow Extension 1   Forearm Pronation 2   Total 15/18     III. Movement Combining Synergies:   12/12/23   Hand to Lumbar Spine 1   Shoulder Flexion at 90*, elbow at 0* 0   Pron/Sup w/elbow at 90* and shoulder at 0* 1   Total 2/6     IV. Movement Out of Synergy   12/12/23   Shoulder Abduction to 90*, elbow at 0*, forearm pronated 0   Shoulder Flexion 9)-180*, elbow at 0*, forearm in mid position 0   Pron/sup, elbow at 0* and shoulder between 30-90* of flexion 0   Total 0/6       B. Wrist   12/12/23   Stability of wrist at 15* ext, elbow at 90*, shoulder 0* 2   Repeated flex/ext, elbow at 90*, shoulder 0* 1   Stability of wrist at 15* ext, elbow at 0*, shoulder 30* 0   Flex/ext, elbow 0*, shoulder 30* 0   Circumduction  1   Total 4/10     C. Hand   12/12/23   Mass Flexion 1   Mass Extension 1   Total 2/4       D. Grasp   12/12/23   Hook  0   Thumb Adduction - paper 1  Pincer - pen 0   Cylindrical - cup/can 0   Spherical - tennis ball 1   Total 2/10     E. Coordination/Speed   12/12/23   Tremor 2   Dysmetria 0   Time 1   Total 3/6     FUGL-MEYER ASSESSMENT: UPPER EXTREMITY A-E TOTAL on (date): 32/66   SENSATION: Sensation feels different in both hands.  L hand Light touch- impaired L hand Proprioception- impaired  EDEMA: Has had hx of edema, and came to Eval session with swelling in the L hand/wrist/forearm.   *pt. has a laceration from his dog on the dorsal aspect of the L hand.   MUSCLE TONE:  Increased tone through the UE and hand flexors.   COGNITION: Overall cognitive status: Within functional limits for tasks assessed  VISION: Subjective report: L sided inattention Baseline vision: Wears glasses for reading only Visual history: Right visual occlusion  VISION ASSESSMENT: To be assessed  PERCEPTION: TBD  PRAXIS: Impaired                                                                                                                 TREATMENT DATE: 01/02/2024 Estim attended: (no charge- paired with neuro re-ed activity below) Tx time: 10 min, 40 mA CC, duty cycle 50%, ramp 5 sec, cycle time 5/5; electrodes placed at LUE proximal and distal forearm, dorsal aspect, to promote wrist and digit extension.   Neuro re-ed: -Facilitated L hand FMC/dexterity skills working with Architect (poker chips and dice).  Poker chips placed on elevated surface ~4 up from table top d/t pt lacking sufficient wrist extension for table top level.  Pt practiced picking up poker chips by sliding to surface edge and sustaining lateral and tip pinch.  Not yet able to sustain a 3 point pinch.  OT assist to stabilize chip while pt grasped it to prevent dropping. -Practiced L hand grasp/release skills with dice, grasping with forearm fully pronated, and releasing in same position above a target (into container); min A to maintain forearm pron during attempts at digit ext on at least 75% of all reps completed.  -Mod vc to control descent of LUE to prevent knocking container after release of dice/chips, mod vc for motor sequencing  Therapeutic Exercise: -Passive stretching for L forearm sup and wrist/digit ext in prep for engaging LUE into daily tasks.  Performed AAROM/PROM for L shoulder flex/abd 0-90, and ER 0-60, working to increase range for engaging LUE into ADLs.   Self Care: -Recommendation to return to wearing resting hand splint at night time to assess increased relaxation in wrist and  digit flexors during the day time.  Pt receptive.   -Continued education provided on Vivistim procedure and therapy per pt/spouse inquiry, including timeline for therapy following procedure, anticipated outcomes, realistic goals to be achieved post implantation.  Education: Education details: Vivistim procedure and therapy Person educated: Patient and spouse Education method: Explanation and Verbal cues Education comprehension: verbalized understanding, further training needed  HOME EXERCISE PROGRAM: -grasping and  stacking 1, and 1/2 cubes form the tabletop surface.  GOALS: Goals reviewed with patient? Yes  SHORT TERM GOALS: Target date: 02/11/2024   Pt. Will be independent with HEP for the LUE.  Baseline: 12/31/23: Independent; ongoing 12/03/23: indep with current HEP, though ongoing progressions are being made as UE function improves; 10/29/23: Pt. requires assist from wife with HEPs. Pt. Is consistently working on HEP's at home. 09/19/23: Requires assist from his wife Eval; no current HEP Goal status: Achieved,Ongoing  LONG TERM GOALS: Target date: 03/24/2024  Pt. Will increase L shoulder flexion by 10 degrees to be able to reach up to shelves.  Baseline: 12/31/23: Left: 95 degrees before scaption, 110 of scaption 12/03/23: 90* before onset of scaption; 10/29/23: 111(115) 09/19/23: Pt. Continues to be limited with reaching up to shelves. Eval: L shoulder flexion is 89 (110). Goal status: progressing, Ongoing  2.  Pt. Will increase L shoulder abduction by 10 degrees to assist with underarm hygiene.  Baseline: 12/31/23: Left: 105(118) 12/03/23: 100*; pt able to demo bilat underarm hygiene with set up; 10/29/23: 888(877) 09/19/23: Pt. Continues to require assist with underarm hygiene.Eval: L shoulder abduction is 98 (120).  Goal status: Progressing, ongoing  3.  Pt. Will improve L wrist extension by 10 degrees to be able to initiate anticipation of grasping for objects.  Baseline: 12/31/23:  30(45) 12/03/23: 30* (45); 10/29/23: 20(55) 09/19/23: Consistent activation noted in the left wrist extensors with facilitation. Eval: -48 (42) Goal status: Ongoing  4.  Pt. Will improve active gross digit extension through 50% of the range to be able to release objects in his hand consistently.  Baseline:12/31/23: Gross digit extension: ~30-40% 12/03/23: Gross digit ext through ~30% of range (limited by flexor tone in the PIPs); 10/28/20: Gross digit extension through 25-50% of the range. 09/19/23: gross digit extension noted through 25% range with facilitation.Eval: gross digit extension is 10% of the range  Goal status: Ongoing  5.  Pt. Will independently demonstrate visual compensatory strategies when navigating through environment and during tabletop tasks. Baseline: 12/03/23: Pt demonstrates consistent head turns to scan to L visual field without cueing; 10/29/2023: Pt. Continues to less cuing for left sided awareness. 09/19/23: Pt. requires fewer cues for left sided awareness Eval: Pt. Requires consistent cuing for L sided awareness.  Goal status: achieved   6. Pt. Will improve left hand [facility] skills to be able to independently manipulate small objects during ADLs, and IADLs. Baseline: 12/31/23: Pt. Is r=progressing to be able to grasp progressively smaller objects. 12/03/23: Pt removed 3 pegs in >3 min, pulling out more than 3 but repeatedly dropping to floor or table top rather than assessment dish; 10/29/23: 9 Hole Peg Test: Pt. Was able to remove 9 pegs from vertical position on the pegboard in 1 min. & 13 sec. Pt. Is not yet able to pick up pegs from a horizontal position to place them in the pegboard.   Goal status: ongoing   7. Pt will improve left hand function skills as evidenced by improved score to 4 blocks on the Box and Blocks test. Baseline: 12/31/23: 4 blocks in 1 min. 12/03/23: 4 blocks after 3 trials and passive wrist and digit stretching between trials (not yet consistent to achieve 4 blocks);  10/29/23: Box and Blocks Test: 2 Blocks completed in 1 min.   Goal Status: ongoing   ASSESSMENT: CLINICAL IMPRESSION:  Paired Estim today with neuro re-ed activities noted above; good tolerance for Estim within above noted parameters.  Estim continues to  be effective to increase indep with releasing dice and poker chips with only occasional min A.  Focussed on maintaining forearm pronation during release activities, as pt tends to partially supinate, which then results in object tending to remain in palm.  Pt receptive to recommendation for return to wearing resting hand splint at night time over the next couple of days to assess whether this allows increased relaxation of the wrist and digit flexors during the day, as wrist and hand flexors remain tight.  Pt and spouse continue to seek out education re: Vivistim procedure and both have verbalized understanding of education thus far.  OT has recommended pt proceed with scheduling surgical consult if pt remains interested in pursuing implantation, as process for insurance authorization can take time.  Pt/spouse receptive.  Pt continues to benefit from OT services to work on improving LUE functioning to increase engagement of the LUE during daily tasks, and to provide education about compensatory strategies during ADLs/IADLs.   PERFORMANCE DEFICITS: in functional skills including ADLs, IADLs, coordination, dexterity, proprioception, sensation, edema, tone, ROM, strength, pain, Fine motor control, Gross motor control, mobility, balance, endurance, vision, and UE functional use, cognitive skills including perception, and psychosocial skills including coping strategies, environmental adaptation, and routines and behaviors.   IMPAIRMENTS: are limiting patient from ADLs, IADLs, and leisure.   CO-MORBIDITIES: may have co-morbidities  that affects occupational performance. Patient will benefit from skilled OT to address above impairments and improve overall  function.  MODIFICATION OR ASSISTANCE TO COMPLETE EVALUATION: Min-Moderate modification of tasks or assist with assess necessary to complete an evaluation.  OT OCCUPATIONAL PROFILE AND HISTORY: Detailed assessment: Review of records and additional review of physical, cognitive, psychosocial history related to current functional performance.  CLINICAL DECISION MAKING: Moderate - several treatment options, min-mod task modification necessary  REHAB POTENTIAL: Good  EVALUATION COMPLEXITY: Moderate  PLAN:  OT FREQUENCY: 2x/week  OT DURATION: 12 weeks  PLANNED INTERVENTIONS: 97168 OT Re-evaluation, 97535 self care/ADL training, 02889 therapeutic exercise, 97530 therapeutic activity, 97112 neuromuscular re-education, 97140 manual therapy, 97018 paraffin, 02989 moist heat, 97010 cryotherapy, 97034 contrast bath, 97760 Orthotic Initial, 97763 Orthotic/Prosthetic subsequent, passive range of motion, visual/perceptual remediation/compensation, energy conservation, and DME and/or AE instructions  RECOMMENDED OTHER SERVICES: PT and ST  CONSULTED AND AGREED WITH PLAN OF CARE: Patient and family member/caregiver  PLAN FOR NEXT SESSION: see above  Inocente Blazing, MS, OTR/L  01/03/2024, 11:06 AM

## 2024-01-07 ENCOUNTER — Encounter: Admitting: Speech Pathology

## 2024-01-07 ENCOUNTER — Ambulatory Visit: Admitting: Occupational Therapy

## 2024-01-07 ENCOUNTER — Ambulatory Visit

## 2024-01-07 DIAGNOSIS — M6281 Muscle weakness (generalized): Secondary | ICD-10-CM

## 2024-01-07 NOTE — Therapy (Signed)
 Occupational Therapy Progress Note  Dates of reporting period  12/03/23   to   01/07/24   Patient Name: [name]. MRN: [id number] DOB:[birth year], 73 y.o., male Today's Date: 01/07/2024  PCP: Alm Rower, MD REFERRING PROVIDER: Hermann Toribio PARAS, PA-C  END OF SESSION:  OT End of Session - 01/07/24 1758     Visit Number 40    Number of Visits 72    Date for Recertification  03/24/24    OT Start Time 1400    OT Stop Time 1445    OT Time Calculation (min) 45 min    Activity Tolerance Patient tolerated treatment well    Behavior During Therapy WFL for tasks assessed/performed          Past Medical History:  Diagnosis Date   Anemia    Cervical myelopathy (HCC)    Chronic venous insufficiency    [name] (chronic kidney disease), stage III (HCC)    Diabetes mellitus without complication (HCC)    GERD (gastroesophageal reflux disease)    Hypercholesteremia    Hypothyroidism    Kidney stones    about 50 in the past--last in March 4/24   Leg weakness, bilateral    due to Myasthenia Gravis   Myasthenia gravis (HCC)    Spinal stenosis    Vitamin B 12 deficiency    Vitamin B 12 deficiency    Wears hearing aid    bilateral   Past Surgical History:  Procedure Laterality Date   BACK SURGERY  09/2015   CARDIAC CATHETERIZATION     CATARACT EXTRACTION W/ INTRAOCULAR LENS IMPLANT     CATARACT EXTRACTION W/PHACO Right 12/21/2015   Procedure: CATARACT EXTRACTION PHACO AND INTRAOCULAR LENS PLACEMENT (IOC);  Surgeon: Dene Etienne, MD;  Location: [facility];  Service: Ophthalmology;  Laterality: Right;  DIABETIC - insulin  and oral meds   COLONOSCOPY     SHOULDER SURGERY     labrum repair   TONSILLECTOMY     Patient Active Problem List   Diagnosis Date Noted   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) 10/11/2023   High blood pressure 08/13/2023   Anemia 08/13/2023   Constipation 08/13/2023   Myasthenia gravis (HCC)  08/13/2023   Acute ischemic right MCA stroke (HCC) 07/19/2023   Lymphedema 12/27/2016   Leg pain 07/15/2016   Chronic venous insufficiency 07/15/2016   Swelling of limb 07/15/2016   Type 2 diabetes mellitus (HCC) 07/15/2016   Hyperlipidemia 07/15/2016   ONSET DATE: 07/13/2023  REFERRING DIAG: Acute ischemic R MCA CVA  THERAPY DIAG: muscle weakness (generalized), other lack of coordination, vision disturbance, hemiplegia and hemiparesis following cerebral infarction affecting left non-dominant side (HCC)  Rationale for Evaluation and Treatment: Rehabilitation  SUBJECTIVE:  SUBJECTIVE STATEMENT: Pt reports  that his PT visits have been denied. Pt accompanied by: [name]  PERTINENT HISTORY: Pt. Was admitted to [facility] on 07/13/2023 after sustaining a CVA while on a trip to the beach. Pt. Was diagnosed with R MCA CVA. Pt. Was admitted to inpatient rehab from 07/19/2023-08/13/2023. Past medical history includes high BP, anemia, and Myasthenia Gravis.   PRECAUTIONS: None  WEIGHT BEARING RESTRICTIONS: No  PAIN: 01/02/24: No pain  FALLS: Has patient fallen in last 6 months? Yes  LIVING ENVIRONMENT: Lives with: lives with their family and lives with their spouse-  Lives in: House/apartment- 2 story home and resides mostly on the 1st floor Stairs: No- Ramped entrance Has following equipment at home:  Vannie, cane,  Steadi, shower chair, handheld shower head, bedside commode  PLOF: Independent and Independent with basic ADLs Independent at home   PATIENT GOALS: Would like to walk 10-20 feet each time.   OBJECTIVE:  Note: Objective measures were completed at Evaluation unless otherwise noted.  HAND DOMINANCE: Right  ADLs: Overall ADLs:  Transfers/ambulation related to ADLs: Eating: Independent, 100% R handed Grooming: independent UB Dressing: Can put on shirt independently LB Dressing: Difficulty with pants and requires assistance getting the pants on his feet, has difficulty  putting on shoes and socks Toileting: Tot A for toilet hygiene. Uses Steadi during toilet transfers. Bathing: Requires assist with using the L arm to wash the R arm. Tub Shower transfers: Roll in shower, built in shower chair, grab bars and hand held shower head.  IADLs: Shopping: Total A (Wife typically did the shopping prior to onset)   Light housekeeping: Total A (wife typically performs prior to onset) Meal Prep: Independent with light snack prep Community mobility: No driving, able to get in/out of the car Medication management: Set up assistance from his wife using a pill box (pt. Is responsible for taking medications at the correct time) Financial management: family members check behind him. Handwriting: TBD Hobbies: gardening, wood working and sports- [employer] Work history: Owns a tax business  MOBILITY STATUS: Needs Assist:    POSTURE COMMENTS:   Sitting balance: Good supported sitting balance  FUNCTIONAL OUTCOME MEASURES:  Fugl-Meyer: see below  UPPER EXTREMITY ROM:    Active ROM Right Eval  [facility] Right 09/23/2023 [facility] Left eval Left 09/23/2023 Left 10/29/23 Left 12/02/23 Left 12/31/23  Shoulder flexion   89(110) 109(114) 111(115) 90 before scaption (110) 95 before scaption (110)  Shoulder abduction   98(120) 109(120) 111(122) 100 (110) 105(118)  Shoulder adduction         Shoulder extension         Shoulder internal rotation         Shoulder external rotation         Elbow flexion   120(140) 130(136) 141(145)  146  Elbow extension   -28(-10) -5(0) -18(0)  -3(0)  Wrist flexion   60(60) Rest at 60 50(65) 54(60)    Wrist extension   -48(42) 10(40) 20(50) 30 (45) 30(45)  Wrist ulnar deviation     12(22)    Wrist radial deviation     10(20)    Wrist pronation     (82)90    Wrist supination     64(74) 70 (75)   (Blank rows = not tested)  Eval: L Digit flexion: 2nd:3cm (0cm) 3rd: 0cm (0cm), 4th: 0cm (0cm), 5th: 2cm (0cm)  09/23/23:  L Digit flexion to [facility]: 2nd:  3.5cm (0cm), 3rd: 4 cm(0cm), 4th 3.5cm (0cm), 5th: 2cm (0cm)  Eval:  L Digit extension: Active Gross digit extension through 10% of ROM  09/23/23:  L Digit extension: Active Gross digit extension through 50% of ROM  10/29/23:  L Digit extension: Active Gross digit extension through 25-50% of ROM  12/02/23:  L digit extension: Active gross ext through 30%   12/31/23:  L digit extension:  Active gross ext through ~30-40%   UPPER EXTREMITY MMT:     MMT Right eval Left eval Left 09/23/2023 Left 10/29/23 Left 12/02/23 Left 12/31/23  Shoulder flexion 5 3- 3-/5 3-/5  3-/5  Shoulder abduction 5 3- 3-/5 3-/5  3-5/  Shoulder adduction        Shoulder extension        Shoulder internal  rotation        Shoulder external rotation        Middle trapezius        Lower trapezius        Elbow flexion 5 TBD 3+/5 4-/5 4+/5 4+/5  Elbow extension 5 TBD 4-/5 3-/5 4+/5 4+/5  Wrist flexion   2/5 3-/5 3-   Wrist extension  2- 2/5 2+/5 3- 3-/5  Wrist ulnar deviation        Wrist radial deviation        Wrist pronation    3/5 3+   Wrist supination    3-/5 3-   (Blank rows = not tested)  HAND FUNCTION:  Eval:  Grip strength: Right: 94#, Left: 1#,  Lateral Key Pinch strength: Right: 14#, Left: 3#  10/29/23:  Grip strength: Left: 16# Lateral Key Pinch strength: Left: 5#  12/02/23: L: 5 lbs, R: 98 # Lateral pinch: Left: 5 lbs   12/31/23:   Grip strength: Right: 105#, Left: 14#,   COORDINATION: Box and Blocks: Right: 34 blocks completed in 1 min., Left: 0 blocks completed in 1 min.   10/29/23:  Box and Blocks: Right: 34 blocks completed in 1 min., Left: 2 blocks completed in 1 min.  9 Hole Peg Test: Left: To remove 9 pegs from a vertical position: 1 min. &13 sec.  12/02/23: Box and Blocks: 4 blocks 12/02/23: 4 pegs out in 3 min (multiple pulled and dropped out of the cup)   12/02/23: Box and Blocks Test: 5 blocks in 1 min.  FUGL-MEYER ASSESSMENT   A. Upper Extremity   I. Reflex  Activity:   12/12/23   Flexors (biceps) 2   Extensors (triceps) 2   Total 4/4                 II. Volitional Movement within Synergies:   12/12/23   Flexor Synergy:      Shoulder Elevation 2   Shoulder Retraction 2   Shoulder Abduction (at least 90*) 2   External Rotation 1   Elbow Flexion 2   Forearm Supination 1   Extensor Synergy:      Shoulder Adduction/Internal Rotation 2   Elbow Extension 1   Forearm Pronation 2   Total 15/18     III. Movement Combining Synergies:   12/12/23   Hand to Lumbar Spine 1   Shoulder Flexion at 90*, elbow at 0* 0   Pron/Sup w/elbow at 90* and shoulder at 0* 1   Total 2/6     IV. Movement Out of Synergy   12/12/23   Shoulder Abduction to 90*, elbow at 0*, forearm pronated 0   Shoulder Flexion 9)-180*, elbow at 0*, forearm in mid position 0   Pron/sup, elbow at 0* and shoulder between 30-90* of flexion 0   Total 0/6       B. Wrist   12/12/23   Stability of wrist at 15* ext, elbow at 90*, shoulder 0* 2   Repeated flex/ext, elbow at 90*, shoulder 0* 1   Stability of wrist at 15* ext, elbow at 0*, shoulder 30* 0   Flex/ext, elbow 0*, shoulder 30* 0   Circumduction  1   Total 4/10     C. Hand   12/12/23   Mass Flexion 1   Mass Extension 1   Total 2/4       D. Grasp   12/12/23   Hook  0   Thumb Adduction - paper 1   Pincer -  pen 0   Cylindrical - cup/can 0   Spherical - tennis ball 1   Total 2/10     E. Coordination/Speed   12/12/23   Tremor 2   Dysmetria 0   Time 1   Total 3/6     FUGL-MEYER ASSESSMENT: UPPER EXTREMITY A-E TOTAL on (date): 32/66   SENSATION: Sensation feels different in both hands.  L hand Light touch- impaired L hand Proprioception- impaired  EDEMA: Has had hx of edema, and came to Eval session with swelling in the L hand/wrist/forearm.   *pt. has a laceration from his dog on the dorsal aspect of the L hand.   MUSCLE TONE: Increased tone through the UE and hand flexors.   COGNITION: Overall  cognitive status: Within functional limits for tasks assessed  VISION: Subjective report: L sided inattention Baseline vision: Wears glasses for reading only Visual history: Right visual occlusion  VISION ASSESSMENT: To be assessed  PERCEPTION: TBD  PRAXIS: Impaired                                                                                                                 TREATMENT DATE: 01/07/2024  Manual Therapy:   -STM was performed to the left scapular, and shoulder musculature to prepare the UE,a nd hand for ROM.  Estim attended:  Tx time: 10 min, 40 mA CC, duty cycle 50%, ramp 5 sec, cycle time 5/5; electrodes placed at LUE proximal and distal forearm, dorsal aspect, to promote wrist and digit extension.    Therapeutic Activities:   -Facilitated left hand grasping for 1/2 cubes from the tabletop surface, and  worked towards reaching across midline to place them from the left to the tabletop surface on the right. Focus was placed on controlled releasing of the cubes onto the table at the right sided combined with weightbearing, and proprioceptive input through the LUE, and hand.  Therapeutic Exercise:   -AROM/AAROM/PROM for the LUE including left shoulder flexion, abduction, horizontal abduction, elbow flexion, extension, forearm supination, pronation, wrist extension, digit MP/PIP/and DIP extension to increase ROM, and decrease stiffness.    Education: Education details: Vivistim procedure and therapy Person educated: Patient and spouse Education method: Explanation and Verbal cues Education comprehension: verbalized understanding, further training needed  HOME EXERCISE PROGRAM: -grasping and stacking 1, and 1/2 cubes form the tabletop surface.  GOALS: Goals reviewed with patient? Yes  SHORT TERM GOALS: Target date: 02/11/2024   Pt. Will be independent with HEP for the LUE.  Baseline: 12/31/23: Independent; ongoing 12/03/23: indep with current HEP, though  ongoing progressions are being made as UE function improves; 10/29/23: Pt. requires assist from wife with HEPs. Pt. Is consistently working on HEP's at home. 09/19/23: Requires assist from his wife Eval; no current HEP Goal status: Achieved,Ongoing  LONG TERM GOALS: Target date: 03/24/2024  Pt. Will increase L shoulder flexion by 10 degrees to be able to reach up to shelves.  Baseline: 01/07/24: Continue 12/31/23: Left: 95 degrees before scaption, 110 of scaption 12/03/23: 90* before onset  of scaption; 10/29/23: 111(115) 09/19/23: Pt. Continues to be limited with reaching up to shelves. Eval: L shoulder flexion is 89 (110). Goal status: progressing, Ongoing  2.  Pt. Will increase L shoulder abduction by 10 degrees to assist with underarm hygiene.  Baseline: 01/07/24: Continue 12/31/23: Left: [phone] 12/03/23: 100*; pt able to demo bilat underarm hygiene with set up; 10/29/23: [phone]) 09/19/23: Pt. Continues to require assist with underarm hygiene.Eval: L shoulder abduction is 98 (120).  Goal status: Progressing, ongoing  3.  Pt. Will improve L wrist extension by 10 degrees to be able to initiate anticipation of grasping for objects.  Baseline:01/07/24: Continue 12/31/23: 30(45) 12/03/23: 30* (45); 10/29/23: 20(55) 09/19/23: Consistent activation noted in the left wrist extensors with facilitation. Eval: -48 (42) Goal status: Ongoing  4.  Pt. Will improve active gross digit extension through 50% of the range to be able to release objects in his hand consistently.  Baseline:01/07/24: Continue 12/31/23: Gross digit extension: ~30-40% 12/03/23: Gross digit ext through ~30% of range (limited by flexor tone in the PIPs); 10/28/20: Gross digit extension through 25-50% of the range. 09/19/23: gross digit extension noted through 25% range with facilitation.Eval: gross digit extension is 10% of the range  Goal status: Ongoing  5.  Pt. Will independently demonstrate visual compensatory strategies when navigating through  environment and during tabletop tasks. Baseline: 12/03/23: Pt demonstrates consistent head turns to scan to L visual field without cueing; 10/29/2023: Pt. Continues to less cuing for left sided awareness. 09/19/23: Pt. requires fewer cues for left sided awareness Eval: Pt. Requires consistent cuing for L sided awareness.  Goal status: achieved   6. Pt. Will improve left hand [facility] skills to be able to independently manipulate small objects during ADLs, and IADLs. Baseline: 01/07/24: Continue 12/31/23: Pt. Is r=progressing to be able to grasp progressively smaller objects. 12/03/23: Pt removed 3 pegs in >3 min, pulling out more than 3 but repeatedly dropping to floor or table top rather than assessment dish; 10/29/23: 9 Hole Peg Test: Pt. Was able to remove 9 pegs from vertical position on the pegboard in 1 min. & 13 sec. Pt. Is not yet able to pick up pegs from a horizontal position to place them in the pegboard.   Goal status: ongoing   7. Pt will improve left hand function skills as evidenced by improved score to 4 blocks on the Box and Blocks test. Baseline: 01/07/24: Continue 12/31/23: 4 blocks in 1 min. 12/03/23: 4 blocks after 3 trials and passive wrist and digit stretching between trials (not yet consistent to achieve 4 blocks); 10/29/23: Box and Blocks Test: 2 Blocks completed in 1 min.   Goal Status: ongoing   ASSESSMENT: CLINICAL IMPRESSION:   Measurements were recently obtained, and goals were reviewed for recertification, and insurance authorization update. Pt. has made consistent progress over this progress reporting period. Plan to continue to the current POC, and goals into this next progress reporting period to work on facilitating active functional movements in the RUE to increase engagement of it during daily ADL, and IADL tasks. Pt continues to benefit from OT services to work on improving LUE functioning to increase engagement of the LUE during daily tasks, and to provide education about  compensatory strategies during ADLs/IADLs.   PERFORMANCE DEFICITS: in functional skills including ADLs, IADLs, coordination, dexterity, proprioception, sensation, edema, tone, ROM, strength, pain, Fine motor control, Gross motor control, mobility, balance, endurance, vision, and UE functional use, cognitive skills including perception, and psychosocial skills including coping strategies,  environmental adaptation, and routines and behaviors.   IMPAIRMENTS: are limiting patient from ADLs, IADLs, and leisure.   CO-MORBIDITIES: may have co-morbidities  that affects occupational performance. Patient will benefit from skilled OT to address above impairments and improve overall function.  MODIFICATION OR ASSISTANCE TO COMPLETE EVALUATION: Min-Moderate modification of tasks or assist with assess necessary to complete an evaluation.  OT OCCUPATIONAL PROFILE AND HISTORY: Detailed assessment: Review of records and additional review of physical, cognitive, psychosocial history related to current functional performance.  CLINICAL DECISION MAKING: Moderate - several treatment options, min-mod task modification necessary  REHAB POTENTIAL: Good  EVALUATION COMPLEXITY: Moderate  PLAN:  OT FREQUENCY: 2x/week  OT DURATION: 12 weeks  PLANNED INTERVENTIONS: 97168 OT Re-evaluation, 97535 self care/ADL training, 02889 therapeutic exercise, 97530 therapeutic activity, 97112 neuromuscular re-education, 97140 manual therapy, 97018 paraffin, 02989 moist heat, 97010 cryotherapy, 97034 contrast bath, 97760 Orthotic Initial, 97763 Orthotic/Prosthetic subsequent, passive range of motion, visual/perceptual remediation/compensation, energy conservation, and DME and/or AE instructions  RECOMMENDED OTHER SERVICES: PT and ST  CONSULTED AND AGREED WITH PLAN OF CARE: Patient and family member/caregiver  PLAN FOR NEXT SESSION: see above  Richardson Otter, MS, OTR/L   01/07/2024, 6:01 PM

## 2024-01-09 ENCOUNTER — Ambulatory Visit

## 2024-01-09 ENCOUNTER — Encounter: Admitting: Physician Assistant

## 2024-01-09 ENCOUNTER — Encounter: Admitting: Speech Pathology

## 2024-01-09 ENCOUNTER — Ambulatory Visit: Admitting: Physical Therapy

## 2024-01-09 DIAGNOSIS — E11622 Type 2 diabetes mellitus with other skin ulcer: Secondary | ICD-10-CM | POA: Diagnosis not present

## 2024-01-09 DIAGNOSIS — M6281 Muscle weakness (generalized): Secondary | ICD-10-CM | POA: Diagnosis not present

## 2024-01-09 DIAGNOSIS — I69354 Hemiplegia and hemiparesis following cerebral infarction affecting left non-dominant side: Secondary | ICD-10-CM

## 2024-01-09 DIAGNOSIS — R278 Other lack of coordination: Secondary | ICD-10-CM

## 2024-01-09 NOTE — Progress Notes (Signed)
 Subjective:    Patient ID: Douglas LITTIE [name]., male    DOB: 1950/07/01, 73 y.o.   MRN: [id number]  HPI Discussed the use of AI scribe software for clinical note transcription with the patient, who gave verbal consent to proceed.  History of Present Illness [name] is a 73 year old male with a history of stroke who presents for follow-up on his rehabilitation progress. He is accompanied by his granddaughter.  He is currently undergoing physical therapy and has started walking with assistance at home using a walker. He has experienced a few falls, with the last one occurring about a week ago without any injuries. He has been taught techniques to fall more safely.  He is able to perform some activities of daily living independently, such as shaving, brushing his teeth, and washing the upper part of his body. However, he requires assistance with tasks like getting into the shower and dressing his lower body, although he can dress his upper body independently.  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  He experiences itching on the left side of his body, including his head, jawbone, and face, and has wondered if it could be related to his stroke recovery. No numbness, rashes, or issues with hot and cold sensation are reported.  He is able to lift his arms over his head and make a fist, though his hand does not open as well as it used to. He feels that his hand is getting a little bit looser. He uses a splint at night and engages in stretching exercises regularly. Electrical stimulation has shown some improvement in finger flexibility.  He has a knee brace to prevent hyperflexion when walking but does not have an ankle brace. He reports dragging his toes when walking, which has been an issue for several years, particularly on the left side.  He is currently taking methotrexate  and has a history of not tolerating certain medications like Botox and Levaquin. He is not taking any muscle relaxers like baclofen or tizanidine at present.    Pain Inventory Average Pain 0 Pain Right Now 0 My pain is no pain  In the last 24 hours, has pain interfered with the following? General activity 0 Relation with others 0 Enjoyment of life 0 What TIME of day is your pain at its worst? Sleep (in general) Good  Pain is worse with: no pain Pain improves with: no pain Relief from Meds: no pain  Family History  Problem Relation Age of Onset   Clotting disorder Father    Heart attack Father    Social History   Socioeconomic History   Marital status: Married    Spouse name: Not on file   Number of children: Not on file   Years of education: Not on file   Highest education level: Not on file  Occupational History   Not on file  Tobacco Use   Smoking status: Never   Smokeless tobacco: Never  Substance and Sexual Activity   Alcohol use: No   Drug use: No   Sexual activity: Not on file  Other Topics Concern   Not on file  Social History Narrative   Not on file   Social Drivers of Health   Financial Resource Strain: Low Risk  (07/15/2023)   [facility] from [facility]   Overall Financial Resource Strain (CARDIA)  Difficulty of Paying Living Expenses: Not hard at all  Food Insecurity: No Food Insecurity (07/15/2023)   Received from [facility]   Hunger Vital Sign    Within the past 12 months, you worried that your food would run out before you got the money to buy more.: Never true    Within the past 12 months, the food you bought just didn't last and you didn't have money to get more.: Never true  Transportation Needs: No Transportation Needs (07/15/2023)   Received from [facility] - Transportation    In the past 12 months, has lack of transportation kept you from medical appointments or from  getting medications?: No    Lack of Transportation (Non-Medical): No  Physical Activity: Not on file  Stress: Not on file  Social Connections: Unknown (08/07/2021)   Received from [facility]   Social Network    Social Network: Not on file   Past Surgical History:  Procedure Laterality Date   BACK SURGERY  09/2015   CARDIAC CATHETERIZATION     CATARACT EXTRACTION W/ INTRAOCULAR LENS IMPLANT     CATARACT EXTRACTION W/PHACO Right 12/21/2015   Procedure: CATARACT EXTRACTION PHACO AND INTRAOCULAR LENS PLACEMENT (IOC);  Surgeon: Dene Etienne, MD;  Location: [facility];  Service: Ophthalmology;  Laterality: Right;  DIABETIC - insulin  and oral meds   COLONOSCOPY     SHOULDER SURGERY     labrum repair   TONSILLECTOMY     Past Surgical History:  Procedure Laterality Date   BACK SURGERY  09/2015   CARDIAC CATHETERIZATION     CATARACT EXTRACTION W/ INTRAOCULAR LENS IMPLANT     CATARACT EXTRACTION W/PHACO Right 12/21/2015   Procedure: CATARACT EXTRACTION PHACO AND INTRAOCULAR LENS PLACEMENT (IOC);  Surgeon: Dene Etienne, MD;  Location: [facility];  Service: Ophthalmology;  Laterality: Right;  DIABETIC - insulin  and oral meds   COLONOSCOPY     SHOULDER SURGERY     labrum repair   TONSILLECTOMY     Past Medical History:  Diagnosis Date   Anemia    Cervical myelopathy (HCC)    Chronic venous insufficiency    [name] (chronic kidney disease), stage III (HCC)    Diabetes mellitus without complication (HCC)    GERD (gastroesophageal reflux disease)    Hypercholesteremia    Hypothyroidism    Kidney stones    about 50 in the past--last in March 4/24   Leg weakness, bilateral    due to Myasthenia Gravis   Myasthenia gravis (HCC)    Spinal stenosis    Vitamin B 12 deficiency    Vitamin B 12 deficiency    Wears hearing aid    bilateral   There were no vitals taken for this visit.  Opioid Risk Score:   Fall Risk Score:  `1  Depression screen [facility] 2/9      10/02/2023    9:52 AM 09/02/2023    1:43 PM  Depression screen PHQ 2/9  Decreased Interest 0 0  Down, Depressed, Hopeless 0 0  PHQ - 2 Score 0 0  Altered sleeping  0  Tired, decreased energy  0  Change in appetite  0  Feeling bad or failure about yourself   2  Trouble concentrating  0  Moving slowly or fidgety/restless  0  Suicidal thoughts  0  PHQ-9 Score  2    Review of Systems  Musculoskeletal:  Positive for gait problem.  All other systems reviewed  and are negative.      Objective:   Physical Exam Constitutional:      Appearance: He is obese.  HENT:     Head: Normocephalic and atraumatic.  Neurological:     Mental Status: He is alert and oriented to person, place, and time.     Motor: Abnormal muscle tone present. No tremor.     Coordination: Coordination abnormal. Impaired rapid alternating movements.     Gait: Gait abnormal.  Psychiatric:        Mood and Affect: Mood normal.        Behavior: Behavior normal.   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 Significant spasticity in left hand limits function. Botox contraindicated due to myasthenia gravis. Tizanidine considered for spasticity management with caution for drowsiness. VivaStim implantation discussed with variable outcomes and insurance challenges. - Start tizanidine 2 mg, increase to 4 mg if needed, monitor for drowsiness. - Discontinue methocarbamol if starting tizanidine. - Continue stretching exercises  and use of mechanical and electrical stimulation devices for hand spasticity. - Consider consultation for VivaStim implantation, understanding variable outcomes and insurance challenges.  Gait abnormality and increased fall risk after stroke Persistent gait abnormality and increased fall risk post-stroke. Uses walker and knee brace. Left foot dragging increases fall risk, especially on stairs. Discussed AFO for gait stability. - Write prescription for ankle-foot orthosis (AFO) to address foot drop and improve gait stability. - Continue physical therapy for gait training and fall prevention. - Encourage supervised walking at home with walker.  Sensory disturbance of left side after stroke Intermittent itching and altered sensation on left side likely related to sensory recovery post-stroke. Sensation for hot and cold intact. Indicative of neural recovery. - Monitor sensory changes and continue therapy to support sensory recovery.   Continue outpatient therapy PT OT

## 2024-01-10 ENCOUNTER — Encounter: Payer: Self-pay | Admitting: Physical Medicine & Rehabilitation

## 2024-01-10 ENCOUNTER — Encounter: Attending: Registered Nurse | Admitting: Physical Medicine & Rehabilitation

## 2024-01-10 VITALS — BP 130/79 | HR 68 | Ht 68.0 in

## 2024-01-10 DIAGNOSIS — M21372 Foot drop, left foot: Secondary | ICD-10-CM | POA: Diagnosis not present

## 2024-01-10 DIAGNOSIS — G8114 Spastic hemiplegia affecting left nondominant side: Secondary | ICD-10-CM | POA: Diagnosis not present

## 2024-01-10 DIAGNOSIS — I69354 Hemiplegia and hemiparesis following cerebral infarction affecting left non-dominant side: Secondary | ICD-10-CM | POA: Insufficient documentation

## 2024-01-10 MED ORDER — TIZANIDINE HCL 2 MG PO TABS: 4.0000 mg | ORAL_TABLET | Freq: Three times a day (TID) | ORAL | 1 refills | Status: DC

## 2024-01-12 NOTE — Therapy (Signed)
 Outpatient Occupational Therapy Neuro Treatment Note  Patient Name: [name]. MRN: [id number] DOB:[birth year], 73 y.o., male Today's Date: 01/12/2024  PCP: Alm Rower, MD REFERRING PROVIDER: Hermann Toribio PARAS, PA-C  END OF SESSION:  OT End of Session - 01/12/24 1217     Visit Number 41    Number of Visits 72    Date for Recertification  03/24/24    OT Start Time 1315    OT Stop Time 1400    OT Time Calculation (min) 45 min    Activity Tolerance Patient tolerated treatment well    Behavior During Therapy WFL for tasks assessed/performed          Past Medical History:  Diagnosis Date   Anemia    Cervical myelopathy (HCC)    Chronic venous insufficiency    [name] (chronic kidney disease), stage III (HCC)    Diabetes mellitus without complication (HCC)    GERD (gastroesophageal reflux disease)    Hypercholesteremia    Hypothyroidism    Kidney stones    about 50 in the past--last in March 4/24   Leg weakness, bilateral    due to Myasthenia Gravis   Myasthenia gravis (HCC)    Spinal stenosis    Vitamin B 12 deficiency    Vitamin B 12 deficiency    Wears hearing aid    bilateral   Past Surgical History:  Procedure Laterality Date   BACK SURGERY  09/2015   CARDIAC CATHETERIZATION     CATARACT EXTRACTION W/ INTRAOCULAR LENS IMPLANT     CATARACT EXTRACTION W/PHACO Right 12/21/2015   Procedure: CATARACT EXTRACTION PHACO AND INTRAOCULAR LENS PLACEMENT (IOC);  Surgeon: Dene Etienne, MD;  Location: [facility];  Service: Ophthalmology;  Laterality: Right;  DIABETIC - insulin  and oral meds   COLONOSCOPY     SHOULDER SURGERY     labrum repair   TONSILLECTOMY     Patient Active Problem List   Diagnosis Date Noted   Acquired left foot drop 01/10/2024   Left spastic hemiplegia (HCC) 01/10/2024   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) 10/11/2023   High blood pressure 08/13/2023   Anemia 08/13/2023    Constipation 08/13/2023   Myasthenia gravis (HCC) 08/13/2023   Acute ischemic right MCA stroke (HCC) 07/19/2023   Lymphedema 12/27/2016   Leg pain 07/15/2016   Chronic venous insufficiency 07/15/2016   Swelling of limb 07/15/2016   Type 2 diabetes mellitus (HCC) 07/15/2016   Hyperlipidemia 07/15/2016   ONSET DATE: 07/13/2023  REFERRING DIAG: Acute ischemic R MCA CVA  THERAPY DIAG: muscle weakness (generalized), other lack of coordination, vision disturbance, hemiplegia and hemiparesis following cerebral infarction affecting left non-dominant side (HCC)  Rationale for Evaluation and Treatment: Rehabilitation  SUBJECTIVE:  SUBJECTIVE STATEMENT: Pt reports he will restart PT next week as additional visits have been authorized. Pt accompanied by: [name]  PERTINENT HISTORY: Pt. Was admitted to [facility] on 07/13/2023 after sustaining a CVA while on a trip to the beach. Pt. Was diagnosed with R MCA CVA. Pt. Was admitted to inpatient rehab from 07/19/2023-08/13/2023. Past medical history includes high BP, anemia, and Myasthenia Gravis.   PRECAUTIONS: None  WEIGHT BEARING RESTRICTIONS: No  PAIN: 01/09/24: No pain  FALLS: Has patient fallen in last 6 months? Yes  LIVING ENVIRONMENT: Lives with: lives with their family and lives with their spouse-  Lives in: House/apartment- 2 story home and resides mostly on the 1st floor Stairs: No- Ramped entrance Has following  equipment at home:  Vannie, cane, Steadi, shower chair, handheld shower head, bedside commode  PLOF: Independent and Independent with basic ADLs Independent at home   PATIENT GOALS: Would like to walk 10-20 feet each time.   OBJECTIVE:  Note: Objective measures were completed at Evaluation unless otherwise noted.  HAND DOMINANCE: Right  ADLs: Overall ADLs:  Transfers/ambulation related to ADLs: Eating: Independent, 100% R handed Grooming: independent UB Dressing: Can put on shirt independently LB Dressing:  Difficulty with pants and requires assistance getting the pants on his feet, has difficulty putting on shoes and socks Toileting: Tot A for toilet hygiene. Uses Steadi during toilet transfers. Bathing: Requires assist with using the L arm to wash the R arm. Tub Shower transfers: Roll in shower, built in shower chair, grab bars and hand held shower head.  IADLs: Shopping: Total A (Wife typically did the shopping prior to onset)   Light housekeeping: Total A (wife typically performs prior to onset) Meal Prep: Independent with light snack prep Community mobility: No driving, able to get in/out of the car Medication management: Set up assistance from his wife using a pill box (pt. Is responsible for taking medications at the correct time) Financial management: family members check behind him. Handwriting: TBD Hobbies: gardening, wood working and sports- [employer] Work history: Owns a tax business  MOBILITY STATUS: Needs Assist:    POSTURE COMMENTS:   Sitting balance: Good supported sitting balance  FUNCTIONAL OUTCOME MEASURES:  Fugl-Meyer: see below  UPPER EXTREMITY ROM:    Active ROM Right Eval  [facility] Right 09/23/2023 [facility] Left eval Left 09/23/2023 Left 10/29/23 Left 12/02/23 Left 12/31/23  Shoulder flexion   89(110) 109(114) 111(115) 90 before scaption (110) 95 before scaption (110)  Shoulder abduction   98(120) 109(120) 111(122) 100 (110) 105(118)  Shoulder adduction         Shoulder extension         Shoulder internal rotation         Shoulder external rotation         Elbow flexion   120(140) 130(136) 141(145)  146  Elbow extension   -28(-10) -5(0) -18(0)  -3(0)  Wrist flexion   60(60) Rest at 60 50(65) 54(60)    Wrist extension   -48(42) 10(40) 20(50) 30 (45) 30(45)  Wrist ulnar deviation     12(22)    Wrist radial deviation     10(20)    Wrist pronation     (82)90    Wrist supination     64(74) 70 (75)   (Blank rows = not tested)  Eval: L Digit flexion: 2nd:3cm (0cm)  3rd: 0cm (0cm), 4th: 0cm (0cm), 5th: 2cm (0cm)  09/23/23:  L Digit flexion to [facility]: 2nd: 3.5cm (0cm), 3rd: 4 cm(0cm), 4th 3.5cm (0cm), 5th: 2cm (0cm)  Eval:  L Digit extension: Active Gross digit extension through 10% of ROM  09/23/23:  L Digit extension: Active Gross digit extension through 50% of ROM  10/29/23:  L Digit extension: Active Gross digit extension through 25-50% of ROM  12/02/23:  L digit extension: Active gross ext through 30%   12/31/23:  L digit extension:  Active gross ext through ~30-40%   UPPER EXTREMITY MMT:     MMT Right eval Left eval Left 09/23/2023 Left 10/29/23 Left 12/02/23 Left 12/31/23  Shoulder flexion 5 3- 3-/5 3-/5  3-/5  Shoulder abduction 5 3- 3-/5 3-/5  3-5/  Shoulder adduction        Shoulder extension  Shoulder internal rotation        Shoulder external rotation        Middle trapezius        Lower trapezius        Elbow flexion 5 TBD 3+/5 4-/5 4+/5 4+/5  Elbow extension 5 TBD 4-/5 3-/5 4+/5 4+/5  Wrist flexion   2/5 3-/5 3-   Wrist extension  2- 2/5 2+/5 3- 3-/5  Wrist ulnar deviation        Wrist radial deviation        Wrist pronation    3/5 3+   Wrist supination    3-/5 3-   (Blank rows = not tested)  HAND FUNCTION:  Eval:  Grip strength: Right: 94#, Left: 1#,  Lateral Key Pinch strength: Right: 14#, Left: 3#  10/29/23:  Grip strength: Left: 16# Lateral Key Pinch strength: Left: 5#  12/02/23: L: 5 lbs, R: 98 # Lateral pinch: Left: 5 lbs   12/31/23:   Grip strength: Right: 105#, Left: 14#,   COORDINATION: Box and Blocks: Right: 34 blocks completed in 1 min., Left: 0 blocks completed in 1 min.   10/29/23:  Box and Blocks: Right: 34 blocks completed in 1 min., Left: 2 blocks completed in 1 min.  9 Hole Peg Test: Left: To remove 9 pegs from a vertical position: 1 min. &13 sec.  12/02/23: Box and Blocks: 4 blocks 12/02/23: 4 pegs out in 3 min (multiple pulled and dropped out of the cup)   12/02/23: Box and  Blocks Test: 5 blocks in 1 min.  FUGL-MEYER ASSESSMENT   A. Upper Extremity   I. Reflex Activity:   12/12/23   Flexors (biceps) 2   Extensors (triceps) 2   Total 4/4                 II. Volitional Movement within Synergies:   12/12/23   Flexor Synergy:      Shoulder Elevation 2   Shoulder Retraction 2   Shoulder Abduction (at least 90*) 2   External Rotation 1   Elbow Flexion 2   Forearm Supination 1   Extensor Synergy:      Shoulder Adduction/Internal Rotation 2   Elbow Extension 1   Forearm Pronation 2   Total 15/18     III. Movement Combining Synergies:   12/12/23   Hand to Lumbar Spine 1   Shoulder Flexion at 90*, elbow at 0* 0   Pron/Sup w/elbow at 90* and shoulder at 0* 1   Total 2/6     IV. Movement Out of Synergy   12/12/23   Shoulder Abduction to 90*, elbow at 0*, forearm pronated 0   Shoulder Flexion 9)-180*, elbow at 0*, forearm in mid position 0   Pron/sup, elbow at 0* and shoulder between 30-90* of flexion 0   Total 0/6       B. Wrist   12/12/23   Stability of wrist at 15* ext, elbow at 90*, shoulder 0* 2   Repeated flex/ext, elbow at 90*, shoulder 0* 1   Stability of wrist at 15* ext, elbow at 0*, shoulder 30* 0   Flex/ext, elbow 0*, shoulder 30* 0   Circumduction  1   Total 4/10     C. Hand   12/12/23   Mass Flexion 1   Mass Extension 1   Total 2/4       D. Grasp   12/12/23   Hook  0   Thumb Adduction - paper 1  Pincer - pen 0   Cylindrical - cup/can 0   Spherical - tennis ball 1   Total 2/10     E. Coordination/Speed   12/12/23   Tremor 2   Dysmetria 0   Time 1   Total 3/6     FUGL-MEYER ASSESSMENT: UPPER EXTREMITY A-E TOTAL on (date): 32/66   SENSATION: Sensation feels different in both hands.  L hand Light touch- impaired L hand Proprioception- impaired  EDEMA: Has had hx of edema, and came to Eval session with swelling in the L hand/wrist/forearm.   *pt. has a laceration from his dog on the dorsal aspect of the L hand.    MUSCLE TONE: Increased tone through the UE and hand flexors.   COGNITION: Overall cognitive status: Within functional limits for tasks assessed  VISION: Subjective report: L sided inattention Baseline vision: Wears glasses for reading only Visual history: Right visual occlusion  VISION ASSESSMENT: To be assessed  PERCEPTION: TBD  PRAXIS: Impaired                                                                                                                 TREATMENT DATE: 01/02/2024 Neuro re-ed: -Ktape applied to dorsal aspect of L thumb to promote IP and MP ext and abd and to reduce flexor tone. -Utilized vibration to thumb abductors and extensors to promote thumb IP ext and radial abd while decreasing flexor tone, for increasing engagement of L hand for grasp/release activities.  -Performed reps of active assisted L hand digit abd/add, thumb IP and MP ext using visual feedback of touching thumb to a target, while using vibration and Ktape noted above; alternated reps with WB through the hand on table top to minimize flexor tone.    Therapeutic Exercise: -Completed closed chain exes for LUE with yellow theraband for 2 sets 10 reps of each of the following: elbow flex, ext, shoulder flex, abd, horiz abd, ER and IR with elbow at side, forearm pron/sup, wrist flex/ext.  OT provided proximal and distal support for all above noted exercises to maintain form and reduce substitution patterns.  Ranges for shoulder flex and abd limited to <60-70* to stay outside of synergistic flexor tone at the elbow   Self Care: -STS transfer with min-mod A, min vc for hand/foot placement.  Functional mobility 57ft x2 trials to move from transport chair <> chair with arm rests.  OT assist to block L knee to prevent hyperextension, and initial min-mod A to ensure L hand securely grasped walker handle.  Education: Education details: Theraband exercises Person educated: Patient and spouse Education method:  Explanation, Demonstration, Tactile cues, and Verbal cues Education comprehension: verbalized understanding, further training needed  HOME EXERCISE PROGRAM: -grasping and stacking 1, and 1/2 cubes form the tabletop surface.  GOALS: Goals reviewed with patient? Yes  SHORT TERM GOALS: Target date: 02/11/2024   Pt. Will be independent with HEP for the LUE.  Baseline: 12/31/23: Independent; ongoing 12/03/23: indep with current HEP, though ongoing progressions are being made as UE  function improves; 10/29/23: Pt. requires assist from wife with HEPs. Pt. Is consistently working on HEP's at home. 09/19/23: Requires assist from his wife Eval; no current HEP Goal status: Achieved,Ongoing  LONG TERM GOALS: Target date: 03/24/2024  Pt. Will increase L shoulder flexion by 10 degrees to be able to reach up to shelves.  Baseline: 12/31/23: Left: 95 degrees before scaption, 110 of scaption 12/03/23: 90* before onset of scaption; 10/29/23: 111(115) 09/19/23: Pt. Continues to be limited with reaching up to shelves. Eval: L shoulder flexion is 89 (110). Goal status: progressing, Ongoing  2.  Pt. Will increase L shoulder abduction by 10 degrees to assist with underarm hygiene.  Baseline: 12/31/23: Left: 105(118) 12/03/23: 100*; pt able to demo bilat underarm hygiene with set up; 10/29/23: 888(877) 09/19/23: Pt. Continues to require assist with underarm hygiene.Eval: L shoulder abduction is 98 (120).  Goal status: Progressing, ongoing  3.  Pt. Will improve L wrist extension by 10 degrees to be able to initiate anticipation of grasping for objects.  Baseline: 12/31/23: 30(45) 12/03/23: 30* (45); 10/29/23: 20(55) 09/19/23: Consistent activation noted in the left wrist extensors with facilitation. Eval: -48 (42) Goal status: Ongoing  4.  Pt. Will improve active gross digit extension through 50% of the range to be able to release objects in his hand consistently.  Baseline:12/31/23: Gross digit extension: ~30-40% 12/03/23:  Gross digit ext through ~30% of range (limited by flexor tone in the PIPs); 10/28/20: Gross digit extension through 25-50% of the range. 09/19/23: gross digit extension noted through 25% range with facilitation.Eval: gross digit extension is 10% of the range  Goal status: Ongoing  5.  Pt. Will independently demonstrate visual compensatory strategies when navigating through environment and during tabletop tasks. Baseline: 12/03/23: Pt demonstrates consistent head turns to scan to L visual field without cueing; 10/29/2023: Pt. Continues to less cuing for left sided awareness. 09/19/23: Pt. requires fewer cues for left sided awareness Eval: Pt. Requires consistent cuing for L sided awareness.  Goal status: achieved   6. Pt. Will improve left hand [facility] skills to be able to independently manipulate small objects during ADLs, and IADLs. Baseline: 12/31/23: Pt. Is r=progressing to be able to grasp progressively smaller objects. 12/03/23: Pt removed 3 pegs in >3 min, pulling out more than 3 but repeatedly dropping to floor or table top rather than assessment dish; 10/29/23: 9 Hole Peg Test: Pt. Was able to remove 9 pegs from vertical position on the pegboard in 1 min. & 13 sec. Pt. Is not yet able to pick up pegs from a horizontal position to place them in the pegboard.   Goal status: ongoing   7. Pt will improve left hand function skills as evidenced by improved score to 4 blocks on the Box and Blocks test. Baseline: 12/31/23: 4 blocks in 1 min. 12/03/23: 4 blocks after 3 trials and passive wrist and digit stretching between trials (not yet consistent to achieve 4 blocks); 10/29/23: Box and Blocks Test: 2 Blocks completed in 1 min.   Goal Status: ongoing   ASSESSMENT: CLINICAL IMPRESSION:  Good benefit from vibration and Ktape to minimize flexor tone in the L thumb today, combined with frequent WB through the hand at table top.  Pt continues to participate in anchoring yellow theraband with R hand, but still requires  extensive manual assist from OT proximally and distally to reduce substitution patterns in the L arm.  Pt continues to benefit from OT services to work on improving LUE functioning to increase engagement  of the LUE during daily tasks, and to provide education about compensatory strategies during ADLs/IADLs.   PERFORMANCE DEFICITS: in functional skills including ADLs, IADLs, coordination, dexterity, proprioception, sensation, edema, tone, ROM, strength, pain, Fine motor control, Gross motor control, mobility, balance, endurance, vision, and UE functional use, cognitive skills including perception, and psychosocial skills including coping strategies, environmental adaptation, and routines and behaviors.   IMPAIRMENTS: are limiting patient from ADLs, IADLs, and leisure.   CO-MORBIDITIES: may have co-morbidities  that affects occupational performance. Patient will benefit from skilled OT to address above impairments and improve overall function.  MODIFICATION OR ASSISTANCE TO COMPLETE EVALUATION: Min-Moderate modification of tasks or assist with assess necessary to complete an evaluation.  OT OCCUPATIONAL PROFILE AND HISTORY: Detailed assessment: Review of records and additional review of physical, cognitive, psychosocial history related to current functional performance.  CLINICAL DECISION MAKING: Moderate - several treatment options, min-mod task modification necessary  REHAB POTENTIAL: Good  EVALUATION COMPLEXITY: Moderate  PLAN:  OT FREQUENCY: 2x/week  OT DURATION: 12 weeks  PLANNED INTERVENTIONS: 97168 OT Re-evaluation, 97535 self care/ADL training, 02889 therapeutic exercise, 97530 therapeutic activity, 97112 neuromuscular re-education, 97140 manual therapy, 97018 paraffin, 02989 moist heat, 97010 cryotherapy, 97034 contrast bath, 97760 Orthotic Initial, 97763 Orthotic/Prosthetic subsequent, passive range of motion, visual/perceptual remediation/compensation, energy conservation, and DME  and/or AE instructions  RECOMMENDED OTHER SERVICES: PT and ST  CONSULTED AND AGREED WITH PLAN OF CARE: Patient and family member/caregiver  PLAN FOR NEXT SESSION: see above  Inocente Blazing, MS, OTR/L  01/12/2024, 12:18 PM

## 2024-01-14 ENCOUNTER — Ambulatory Visit: Admitting: Occupational Therapy

## 2024-01-14 ENCOUNTER — Encounter: Admitting: Speech Pathology

## 2024-01-14 ENCOUNTER — Ambulatory Visit: Admitting: Physical Therapy

## 2024-01-14 DIAGNOSIS — I69354 Hemiplegia and hemiparesis following cerebral infarction affecting left non-dominant side: Secondary | ICD-10-CM

## 2024-01-14 DIAGNOSIS — R262 Difficulty in walking, not elsewhere classified: Secondary | ICD-10-CM

## 2024-01-14 DIAGNOSIS — R278 Other lack of coordination: Secondary | ICD-10-CM

## 2024-01-14 DIAGNOSIS — R2681 Unsteadiness on feet: Secondary | ICD-10-CM

## 2024-01-14 DIAGNOSIS — M6281 Muscle weakness (generalized): Secondary | ICD-10-CM

## 2024-01-14 DIAGNOSIS — R269 Unspecified abnormalities of gait and mobility: Secondary | ICD-10-CM

## 2024-01-14 NOTE — Therapy (Signed)
 OUTPATIENT PHYSICAL THERAPY TREATMENT  Patient Name: [name]. MRN: [id number] DOB:[birth year], 73 y.o., male Today's Date: 01/15/2024 PCP: Dennise Remak, MD  REFERRING PROVIDER:  Pegge Toribio PARAS, PA-C  END OF SESSION:   PT End of Session - 01/14/24 1530     Visit Number 41    Number of Visits 64    Date for Recertification  03/24/24    Authorization Type U[employer]    Authorization Time Period [facility] auth#; [id number] A for 16 PT vst from 10/09-01/02/2025 (10/21 is 1 of 16)    Authorization - Visit Number 1    Authorization - Number of Visits 16    Progress Note Due on Visit 50    PT Start Time 1535    PT Stop Time 1617    PT Time Calculation (min) 42 min    Equipment Utilized During Treatment Gait belt    Activity Tolerance Patient tolerated treatment well;No increased pain    Behavior During Therapy WFL for tasks assessed/performed            Past Medical History:  Diagnosis Date   Anemia    Cervical myelopathy (HCC)    Chronic venous insufficiency    [name] (chronic kidney disease), stage III (HCC)    Diabetes mellitus without complication (HCC)    GERD (gastroesophageal reflux disease)    Hypercholesteremia    Hypothyroidism    Kidney stones    about 50 in the past--last in March 4/24   Leg weakness, bilateral    due to Myasthenia Gravis   Myasthenia gravis (HCC)    Spinal stenosis    Vitamin B 12 deficiency    Vitamin B 12 deficiency    Wears hearing aid    bilateral   Past Surgical History:  Procedure Laterality Date   BACK SURGERY  09/2015   CARDIAC CATHETERIZATION     CATARACT EXTRACTION W/ INTRAOCULAR LENS IMPLANT     CATARACT EXTRACTION W/PHACO Right 12/21/2015   Procedure: CATARACT EXTRACTION PHACO AND INTRAOCULAR LENS PLACEMENT (IOC);  Surgeon: Dene Etienne, MD;  Location: [facility];  Service: Ophthalmology;  Laterality: Right;  DIABETIC - insulin  and oral meds   COLONOSCOPY     SHOULDER SURGERY     labrum repair    TONSILLECTOMY     Patient Active Problem List   Diagnosis Date Noted   Acquired left foot drop 01/10/2024   Left spastic hemiplegia (HCC) 01/10/2024   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) 10/11/2023   High blood pressure 08/13/2023   Anemia 08/13/2023   Constipation 08/13/2023   Myasthenia gravis (HCC) 08/13/2023   Acute ischemic right MCA stroke (HCC) 07/19/2023   Lymphedema 12/27/2016   Leg pain 07/15/2016   Chronic venous insufficiency 07/15/2016   Swelling of limb 07/15/2016   Type 2 diabetes mellitus (HCC) 07/15/2016   Hyperlipidemia 07/15/2016    ONSET DATE: 07/19/23  REFERRING DIAG:  P36.488 (ICD-10-CM) - Cerebral infarction due to unspecified occlusion or stenosis of right middle cerebral artery   THERAPY DIAG:  Muscle weakness (generalized)  Other lack of coordination  Hemiplegia and hemiparesis following cerebral infarction affecting left non-dominant side (HCC)  Difficulty in walking, not elsewhere classified  Unsteadiness on feet  Abnormality of gait and mobility  Rationale for Evaluation and Treatment: Rehabilitation  SUBJECTIVE:  SUBJECTIVE STATEMENT:   Pt and wife excited by patient's additional approved physical therapy visits. Pt and wife report patient continues to ambulate daily at home using RW, approximately 51ft-30ft at least, and S.O. provides close guarding and patient stays next to the couch and a wall for safety.  Patient states MD told him that his L LE is getting tight and would benefit from additional stretches due to increased tone. Patient states the MD suggested/recommended patient get an AFO for his L LE and patient has an appointment with [facility] the 2nd week of November for an assessment. Patient states he  feels some tightness, but doesn't feel it has changed since he was discharged from the hospital. Denies pain. No reports of stumbles/falls.    PERTINENT HISTORY:  CVA with Left hemiplegia in April 2025. Pt started on a swedish knee cage at CIR due to uncontrolled hyperextension in stance. Pt had not been moving much since discharging from [facility], as therapists instructed pt to perform transfers only at home due to high fall risk with ambulation. Continues to have significant weakenss in the LUE with difficulty extending fingers as well as numbness and inattention to the L side of body resulting in multiple cuts on Arm and leg with WC mobility in home. Will be attaining custom power WC from [employer]. Has loaner currently.   PAIN:  Are you having pain? No  PRECAUTIONS: Fall, history of wounds on LLE   *Latex allergy   WEIGHT BEARING RESTRICTIONS: No  FALLS: Has patient fallen in last 6 months? Yes. Number of falls 1  LIVING ENVIRONMENT: Lives with: lives with their spouse Lives in: House/apartment Stairs: Ramps installed while in the Hospital  Has following equipment at home: Vannie - 2 wheeled and Wheelchair (power)  PLOF: Independent with basic ADLs, Independent with household mobility without device, and Independent with community mobility with device with [facility]   PATIENT GOALS: relearn to Walk 15-25ft.   OBJECTIVE:                                                                                                                              TREATMENT DATE: 01/14/2024  Pt arrives in transport chair.   Therapist educated patient and S.O. on the following stretches for L LE tone management:  Seated hamstring and gastroc stretch with L foot propped up on small step and patient using gait belt to pull ankle into DF Therapist educated pt on how to properly use gait belt to safely pull ankle into DF while avoiding excessively supinating his ankle Pt noticed to have hamstring tightness, lacking  full knee extension ROM during this stretch Seated knee flexion stretch using gait belt to facilitate bringing foot back underneath his BOS Noticed pt does have increased tightness in quads when moving into 90 degree of knee flexion Therapist educated that S.O. will need to assist him with setting up this stretch at home to ensure patient safety  Updated pt's  HEP below to include the stated stretches.   Therapist educated patient and S.O. that this therapist does not feel patient would benefit from use of an AFO because pt has demonstrated adequate foot clearance and active L LE ankle DF during swing. When patient does lack L LE foot clearance during swing, it is due to decreased hip flexor activation, which would not be addressed by an AFO. Patient's wife did report patient has a long hx of decreased L LE foot clearance (foot drop) related to his Myasthenia Gravis. Therapist suggested patient attend his appointment with [facility] to have them perform an assessment in order to receive a second opinion.   Donned litegait harness.   Pt able to step on/off treadmill in litegait harness for balance support, using B UE support on handrails.  Gait training the following trials on the treadmill in litegait harness providing balance support, but not true BWS and using B UE support: Did not use ACE wrap around L UE today as patient turning on treadmill 142ft forwards at 1.44mph Achieves consistent reciprocal pattern with good L LE foot clearance, no instances of foot drop or toe catching during swing Approximately 39ft-50ft sideways towards R at 0.1-0.36mph Using B UE support Pt with significant difficulty motor planning and sequencing sideways steps on treadmill  Therapist allowing patient to make errors in safe set-up to promote motor learning, but providing verbal and visual cuing to improve foot placement Approximately 34ft-50ft backwards at 0.3-0. using B UE support Requires min/mod manual  facilitation to increase L LE step length backwards due to decreased knee flexion and hip extension activation together; however, when stepped off treadmill and had pt perform backwards steps overground in litegait harness to back-up to chair, pt demos significant improvement in L LE step length Requires frequent pauses when switching directions/turning or can attempt doing it at 0. Totaled 253ft in 27min30sec  Stepped off treadmill and doffed litegait harness.  Exited session in transport chair and in the care of his wife.  PATIENT EDUCATION: Education details: knee cage needs assessmnet as it is causeing crossover gait.  Person educated: Patient and Spouse Education method: Explanation, VC/TC/Demo Education comprehension: verbalized understanding  HOME EXERCISE PROGRAM: Access Code: [email or web address] URL: [email or web address] Date: 01/14/2024 Prepared by: Connell Kiss  Exercises - Seated Knee Extension AROM  - 1 x daily - 4 x weekly - 3 sets - 10 reps - Seated Hip Abduction  - 1 x daily - 4 x weekly - 3 sets - 10 reps - Seated March  - 1 x daily - 4 x weekly - 3 sets - 10 reps - Sit to Stand with Counter Support  - 1 x daily - 4 x weekly - 3 sets - 10 reps - Supine Short Arc Quad  - 1 x daily - 4 x weekly - 3 sets - 10 reps - Supine Hip Abduction  - 1 x daily - 4 x weekly - 3 sets - 10 reps - Supine Bridge  - 1 x daily - 4 x weekly - 3 sets - 3 reps - 2sec  hold - Seated Hamstring Stretch with Strap  - 2-3 x daily - 7 x weekly - 2 sets - 30 second to 1 minute hold - Seated Knee Flexion Slide  - 2-3 x daily - 7 x weekly - 2 sets - 30 seconds to 1 minute hold  12/17/2023: verbal instruction to complete repeated sit<>stands with education on safe set-up provided  GOALS: Goals reviewed with patient? Yes  SHORT TERM GOALS: Target date: 02/11/2024  Patient will be independent in home exercise program to improve strength/mobility for better functional independence with  ADLs. Baseline: Initiated on 09/11/2023 10/22/2023: will update when appropriate 11/26/2023: patient participating in transfer and gait practice in controlled environment with 2 person assistance at home in addition to HEP 12/31/2023: patient participating in gait using bari-RW at home with family assistance Goal status: IN PROGRESS  LONG TERM GOALS: Target date: 03/24/2024  Patient will increase SIS-16 score to equal to or greater than 10points to demonstrate statistically significant improvement in mobility and quality of life.  Baseline: 49/80 09/19/2023: 45/80 10/22/2023: 46/80 11/26/2023: 47/80 12/31/2023: 53/80 Goal status: IN PROGRESS  2.  Patient (> 32 years old) will complete five times sit to stand test in < 15 seconds indicating an increased LE strength and improved balance. Baseline: 1:83min 09/19/2023: 56.39 seconds using R UE support to push-up from armrest of green chair and requiring skilled min A for balance and lifting/lowering  10/22/2023: 27.10 seconds using R UE support to push-up from armrest  8/12: 27.7 sec with RUE pushing from arm rest.  11/26/2023: 37.42 seconds using R UE support to push-up from armrest of green chair and not using L UE support with min-modA for facilitating anterior weight shift and lifting to come to stand secondary to fatigue (would benefit from re-testing at next session) 12/17/2023: 24.59 seconds using R UE support to push-up from armrest of green chair and not using L UE support with CGA/light min A for safety/steadying  Goal status: IN PROGRESS  3.  Patient will increase FIST score by > 6 points to demonstrate decreased fall risk during functional activities Baseline:  43/56  09/23/2023: 53/56 Goal status: MET and UPGRADED  09/23/2023: Patient will increase Berg Balance score to > 45/56 to demonstrate improved balance and decreased fall risk during functional activities and ADLs.  Baseline: 09/23/2023: 14/56 7/31: 20/56 using RW support 11/26/2023:  17/56 without AD support 12/31/2023: 21/56 without AD support Goal status: IN PROGRESS  4.  Patient will increase 10 meter walk test to >0.66m/s as to improve gait speed for better community ambulation and to reduce fall risk. Baseline: 09/09/2023: 0.103 m/s ( and 37 seconds) using bari-RW with skilled heavy min A and +2 w/c follow for safety  09/19/2023: 0.44m/s using bari-RW with skilled min A of 1 and +2 w/c follow for safety 10/22/2023: 0.1875 m/s using bari-RW with L hand splint, L swedish knee cage, and only skilled light min A of 1 (no wheelchair follow)  8/12: 0.130m/s with RW, and L hand splint. No knee cage on this day.  11/26/2023: Average Normal speed: 0.20 m/s & Average Fast speed: 0.275 m/s using bari-RW with CGA/min A for balance wearing [facility] 12/17/2023: Average Normal speed: 0.32 m/s using bari-RW with CGA & Average Fast speed: 0.36 m/s using bari-RW requiring CGA/light min A  12/31/2023: Average Normal speed: 0.3275 m/s using bari-RW with CGA/light min A for steadying Goal status: IN PROGRESS  5.  Patient will reduce timed up and go to <11 seconds to reduce fall risk and demonstrate improved transfer/gait ability. Baseline: unable to complete turn at 10 ft. Mod-max assist due to poor control of LW on RW, resulting in L lateral LOB  09/09/2023: and 32 seconds using bari-RW with L hand splint and +2 on R side for safety, therapist providing skilled min A on L side (Had pt set-up L hand on RW splint, get L foot in proper positioning, and scoot  forward in seat before starting the timer) 09/19/2023: 21min43 seconds using bari-RW with only skilled min A of 1 (Had pt set-up L hand on RW splint, get L foot in proper positioning, and scoot forward in seat before starting the timer) 7/2: 78sec(1:18.125) with RW and min assist overall.  10/22/2023: 1 min, 30 seconds using bari-RW with L hand splint and skilled light min assist  8/12: 1:01 min with RW and L hand splint.  11/26/2023: 51.57 seconds  using bari-RW with hand splint, wearing SKC, and CGA-minA for balance 12/31/2023: 43.88 seconds using bari-RW with hand splint, wearing SKC, and CGA/occasional light min A   Goal status: IN PROGRESS   ASSESSMENT:  CLINICAL IMPRESSION:  Patient arrives motivated to participate in therapy session and is excited about the approval of additional physical therapy visits. Patient reports he was told by MD he has increased tightness and increased tone in L LE with MD recommending patient have an AFO consult. Therapist educated and led patient through 2 stretches for L LE to assist with tone management and ensure full flexibility for functional mobility. Therapist educated patient on therapist feeling patient would not benefit from an AFO based on gait mechanics observed throughout therapy sessions; however, suggested patient attend his appointment with [facility] to get a second opinion. Remainder of therapy session focused on progression to more dynamic gait training on treadmill including sidestepping towards R (will progress towards to L) and backwards gait training in order to allow patient to safely make errors in his steps and promote motor learning. Pt will continue to benefit from continued skilled physical therapy intervention to address impairments, improve QOL, and attain therapy goals. Patient's condition has the potential to improve in response to therapy. Maximum improvement is yet to be obtained. The anticipated improvement is attainable and reasonable in a generally predictable time.      OBJECTIVE IMPAIRMENTS: Abnormal gait, cardiopulmonary status limiting activity, decreased activity tolerance, decreased balance, decreased cognition, decreased coordination, decreased endurance, decreased knowledge of condition, decreased knowledge of use of DME, decreased mobility, difficulty walking, decreased ROM, decreased strength, decreased safety awareness, dizziness, hypomobility, increased fascial  restrictions, impaired perceived functional ability, increased muscle spasms, impaired sensation, impaired tone, impaired UE functional use, impaired vision/preception, improper body mechanics, postural dysfunction, and obesity.   ACTIVITY LIMITATIONS: carrying, lifting, bending, sitting, standing, squatting, stairs, transfers, bed mobility, bathing, toileting, dressing, reach over head, hygiene/grooming, locomotion level, and caring for others  PARTICIPATION LIMITATIONS: meal prep, cleaning, laundry, medication management, interpersonal relationship, driving, shopping, community activity, and yard work  PERSONAL FACTORS: Age, Past/current experiences, Time since onset of injury/illness/exacerbation, and 3+ comorbidities: MG, HTN, CVA, lymphedema are also affecting patient's functional outcome.   REHAB POTENTIAL: Good  CLINICAL DECISION MAKING: Unstable/unpredictable  EVALUATION COMPLEXITY: High  PLAN:  PT FREQUENCY: 1-2x/week  PT DURATION: 8 weeks  PLANNED INTERVENTIONS: 97164- PT Re-evaluation, 97750- Physical Performance Testing, 97110-Therapeutic exercises, 97530- Therapeutic activity, [id number]- Neuromuscular re-education, 97535- Self Care, [id number]- Manual therapy, [id number]- Gait training, [id number]- Orthotic Initial, [id number]- Orthotic/Prosthetic subsequent, [phone]- Canalith repositioning, H9716- Electrical stimulation (unattended), [phone]- Electrical stimulation (manual), [id number]- Wound care (first 20 sq cm), 97598- Wound care (each additional 20 sq cm), Patient/Family education, Balance training, Stair training, Taping, Dry Needling, Joint mobilization, Joint manipulation, Vestibular training, Visual/preceptual remediation/compensation, Cognitive remediation, DME instructions, Wheelchair mobility training, Cryotherapy, and Moist heat  PLAN FOR NEXT SESSION:   Follow-up on addition of STS to HEP Continue stair navigation training HIGT (high intensity gait training) using litegait harness either on  treadmill vs overground Decreasing reliance on R UE support Dynamic gait training using bari-RW vs litegait Backwards Side stepping Obstacle navigation (turning) Working on forward propulsion with increased L hip extension and forward progression of pelvis over L stance phase Dynamic standing balance of trunk rotation Continue monitoring SKC fit & see if posterior knee strap needs to be adjusted   Connell Kiss, PT, DPT, NCS, CSRS Physical Therapist - [facility]  [facility]  10:38 AM 01/15/24

## 2024-01-15 NOTE — Therapy (Signed)
 Outpatient Occupational Therapy Neuro Treatment Note  Patient Name: Glenmore Karl. MRN: [id number] DOB:September 08, 1950, 73 y.o., male Today's Date: 01/15/2024  PCP: Alm Rower, MD REFERRING PROVIDER: Hermann Toribio PARAS, PA-C  END OF SESSION:  OT End of Session - 01/15/24 1513     Visit Number 42    Number of Visits 72    Date for Recertification  03/24/24    OT Start Time 1445    OT Stop Time 1530    OT Time Calculation (min) 45 min    Activity Tolerance Patient tolerated treatment well    Behavior During Therapy [facility] for tasks assessed/performed          Past Medical History:  Diagnosis Date   Anemia    Cervical myelopathy (HCC)    Chronic venous insufficiency    [name] (chronic kidney disease), stage III (HCC)    Diabetes mellitus without complication (HCC)    GERD (gastroesophageal reflux disease)    Hypercholesteremia    Hypothyroidism    Kidney stones    about 50 in the past--last in March 4/24   Leg weakness, bilateral    due to Myasthenia Gravis   Myasthenia gravis (HCC)    Spinal stenosis    Vitamin B 12 deficiency    Vitamin B 12 deficiency    Wears hearing aid    bilateral   Past Surgical History:  Procedure Laterality Date   BACK SURGERY  09/2015   CARDIAC CATHETERIZATION     CATARACT EXTRACTION W/ INTRAOCULAR LENS IMPLANT     CATARACT EXTRACTION W/PHACO Right 12/21/2015   Procedure: CATARACT EXTRACTION PHACO AND INTRAOCULAR LENS PLACEMENT (IOC);  Surgeon: Dene Etienne, MD;  Location: [facility];  Service: Ophthalmology;  Laterality: Right;  DIABETIC - insulin  and oral meds   COLONOSCOPY     SHOULDER SURGERY     labrum repair   TONSILLECTOMY     Patient Active Problem List   Diagnosis Date Noted   Acquired left foot drop 01/10/2024   Left spastic hemiplegia (HCC) 01/10/2024   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) 10/11/2023   High blood pressure 08/13/2023   Anemia 08/13/2023    Constipation 08/13/2023   Myasthenia gravis (HCC) 08/13/2023   Acute ischemic right MCA stroke (HCC) 07/19/2023   Lymphedema 12/27/2016   Leg pain 07/15/2016   Chronic venous insufficiency 07/15/2016   Swelling of limb 07/15/2016   Type 2 diabetes mellitus (HCC) 07/15/2016   Hyperlipidemia 07/15/2016   ONSET DATE: 07/13/2023  REFERRING DIAG: Acute ischemic R MCA CVA  THERAPY DIAG: muscle weakness (generalized), other lack of coordination, vision disturbance, hemiplegia and hemiparesis following cerebral infarction affecting left non-dominant side (HCC)  Rationale for Evaluation and Treatment: Rehabilitation  SUBJECTIVE:  SUBJECTIVE STATEMENT: Pt. reports doing well today. Pt accompanied by: [name]  PERTINENT HISTORY: Pt. Was admitted to [facility] on 07/13/2023 after sustaining a CVA while on a trip to the beach. Pt. Was diagnosed with R MCA CVA. Pt. Was admitted to inpatient rehab from 07/19/2023-08/13/2023. Past medical history includes high BP, anemia, and Myasthenia Gravis.   PRECAUTIONS: None  WEIGHT BEARING RESTRICTIONS: No  PAIN: 01/09/24: No pain  FALLS: Has patient fallen in last 6 months? Yes  LIVING ENVIRONMENT: Lives with: lives with their family and lives with their spouse-  Lives in: House/apartment- 2 story home and resides mostly on the 1st floor Stairs: No- Ramped entrance Has following equipment at home:  Vannie, cane, Steadi, shower chair,  handheld shower head, bedside commode  PLOF: Independent and Independent with basic ADLs Independent at home   PATIENT GOALS: Would like to walk 10-20 feet each time.   OBJECTIVE:  Note: Objective measures were completed at Evaluation unless otherwise noted.  HAND DOMINANCE: Right  ADLs: Overall ADLs:  Transfers/ambulation related to ADLs: Eating: Independent, 100% R handed Grooming: independent UB Dressing: Can put on shirt independently LB Dressing: Difficulty with pants and requires assistance getting the  pants on his feet, has difficulty putting on shoes and socks Toileting: Tot A for toilet hygiene. Uses Steadi during toilet transfers. Bathing: Requires assist with using the L arm to wash the R arm. Tub Shower transfers: Roll in shower, built in shower chair, grab bars and hand held shower head.  IADLs: Shopping: Total A (Wife typically did the shopping prior to onset)   Light housekeeping: Total A (wife typically performs prior to onset) Meal Prep: Independent with light snack prep Community mobility: No driving, able to get in/out of the car Medication management: Set up assistance from his wife using a pill box (pt. Is responsible for taking medications at the correct time) Financial management: family members check behind him. Handwriting: TBD Hobbies: gardening, wood working and sports- [employer] Work history: Owns a tax business  MOBILITY STATUS: Needs Assist:    POSTURE COMMENTS:   Sitting balance: Good supported sitting balance  FUNCTIONAL OUTCOME MEASURES:  Fugl-Meyer: see below  UPPER EXTREMITY ROM:    Active ROM Right Eval  [facility] Right 09/23/2023 [facility] Left eval Left 09/23/2023 Left 10/29/23 Left 12/02/23 Left 12/31/23  Shoulder flexion   89(110) 109(114) 111(115) 90 before scaption (110) 95 before scaption (110)  Shoulder abduction   98(120) 109(120) 111(122) 100 (110) 105(118)  Shoulder adduction         Shoulder extension         Shoulder internal rotation         Shoulder external rotation         Elbow flexion   120(140) 130(136) 141(145)  146  Elbow extension   -28(-10) -5(0) -18(0)  -3(0)  Wrist flexion   60(60) Rest at 60 50(65) 54(60)    Wrist extension   -48(42) 10(40) 20(50) 30 (45) 30(45)  Wrist ulnar deviation     12(22)    Wrist radial deviation     10(20)    Wrist pronation     (82)90    Wrist supination     64(74) 70 (75)   (Blank rows = not tested)  Eval: L Digit flexion: 2nd:3cm (0cm) 3rd: 0cm (0cm), 4th: 0cm (0cm), 5th: 2cm  (0cm)  09/23/23:  L Digit flexion to [facility]: 2nd: 3.5cm (0cm), 3rd: 4 cm(0cm), 4th 3.5cm (0cm), 5th: 2cm (0cm)  Eval:  L Digit extension: Active Gross digit extension through 10% of ROM  09/23/23:  L Digit extension: Active Gross digit extension through 50% of ROM  10/29/23:  L Digit extension: Active Gross digit extension through 25-50% of ROM  12/02/23:  L digit extension: Active gross ext through 30%   12/31/23:  L digit extension:  Active gross ext through ~30-40%   UPPER EXTREMITY MMT:     MMT Right eval Left eval Left 09/23/2023 Left 10/29/23 Left 12/02/23 Left 12/31/23  Shoulder flexion 5 3- 3-/5 3-/5  3-/5  Shoulder abduction 5 3- 3-/5 3-/5  3-5/  Shoulder adduction        Shoulder extension        Shoulder internal rotation  Shoulder external rotation        Middle trapezius        Lower trapezius        Elbow flexion 5 TBD 3+/5 4-/5 4+/5 4+/5  Elbow extension 5 TBD 4-/5 3-/5 4+/5 4+/5  Wrist flexion   2/5 3-/5 3-   Wrist extension  2- 2/5 2+/5 3- 3-/5  Wrist ulnar deviation        Wrist radial deviation        Wrist pronation    3/5 3+   Wrist supination    3-/5 3-   (Blank rows = not tested)  HAND FUNCTION:  Eval:  Grip strength: Right: 94#, Left: 1#,  Lateral Key Pinch strength: Right: 14#, Left: 3#  10/29/23:  Grip strength: Left: 16# Lateral Key Pinch strength: Left: 5#  12/02/23: L: 5 lbs, R: 98 # Lateral pinch: Left: 5 lbs   12/31/23:   Grip strength: Right: 105#, Left: 14#,   COORDINATION: Box and Blocks: Right: 34 blocks completed in 1 min., Left: 0 blocks completed in 1 min.   10/29/23:  Box and Blocks: Right: 34 blocks completed in 1 min., Left: 2 blocks completed in 1 min.  9 Hole Peg Test: Left: To remove 9 pegs from a vertical position: 1 min. &13 sec.  12/02/23: Box and Blocks: 4 blocks 12/02/23: 4 pegs out in 3 min (multiple pulled and dropped out of the cup)   12/02/23: Box and Blocks Test: 5 blocks in 1  min.  FUGL-MEYER ASSESSMENT   A. Upper Extremity   I. Reflex Activity:   12/12/23   Flexors (biceps) 2   Extensors (triceps) 2   Total 4/4                 II. Volitional Movement within Synergies:   12/12/23   Flexor Synergy:      Shoulder Elevation 2   Shoulder Retraction 2   Shoulder Abduction (at least 90*) 2   External Rotation 1   Elbow Flexion 2   Forearm Supination 1   Extensor Synergy:      Shoulder Adduction/Internal Rotation 2   Elbow Extension 1   Forearm Pronation 2   Total 15/18     III. Movement Combining Synergies:   12/12/23   Hand to Lumbar Spine 1   Shoulder Flexion at 90*, elbow at 0* 0   Pron/Sup w/elbow at 90* and shoulder at 0* 1   Total 2/6     IV. Movement Out of Synergy   12/12/23   Shoulder Abduction to 90*, elbow at 0*, forearm pronated 0   Shoulder Flexion 9)-180*, elbow at 0*, forearm in mid position 0   Pron/sup, elbow at 0* and shoulder between 30-90* of flexion 0   Total 0/6       B. Wrist   12/12/23   Stability of wrist at 15* ext, elbow at 90*, shoulder 0* 2   Repeated flex/ext, elbow at 90*, shoulder 0* 1   Stability of wrist at 15* ext, elbow at 0*, shoulder 30* 0   Flex/ext, elbow 0*, shoulder 30* 0   Circumduction  1   Total 4/10     C. Hand   12/12/23   Mass Flexion 1   Mass Extension 1   Total 2/4       D. Grasp   12/12/23   Hook  0   Thumb Adduction - paper 1   Pincer - pen 0   Cylindrical - cup/can  0   Spherical - tennis ball 1   Total 2/10     E. Coordination/Speed   12/12/23   Tremor 2   Dysmetria 0   Time 1   Total 3/6     FUGL-MEYER ASSESSMENT: UPPER EXTREMITY A-E TOTAL on (date): 32/66   SENSATION: Sensation feels different in both hands.  L hand Light touch- impaired L hand Proprioception- impaired  EDEMA: Has had hx of edema, and came to Eval session with swelling in the L hand/wrist/forearm.   *pt. has a laceration from his dog on the dorsal aspect of the L hand.   MUSCLE TONE: Increased  tone through the UE and hand flexors.   COGNITION: Overall cognitive status: Within functional limits for tasks assessed  VISION: Subjective report: L sided inattention Baseline vision: Wears glasses for reading only Visual history: Right visual occlusion  VISION ASSESSMENT: To be assessed  PERCEPTION: TBD  PRAXIS: Impaired                                                                                                                 TREATMENT DATE: 01/14/2024  Estim attended:   -Tx time: 10 min, 40 mA CC, duty cycle 50%, ramp 5 sec, cycle time 5/5; electrodes placed at LUE proximal and distal forearm, dorsal aspect, to promote wrist and digit extension.     Therapeutic Exercise:   -AROM/AAROM/PROM for the LUE including left shoulder flexion, abduction, horizontal abduction, elbow flexion, extension, forearm supination, pronation, wrist extension, digit MP/PIP/and DIP extension to increase ROM, and decrease stiffness -UE strengthening was performed for bilateral horizontal abduction, external rotation,right elbow flexion, and extension 1-2 sets 10-20 reps each   Education: Education details: Theraband exercises Person educated: Patient and spouse Education method: Explanation, Demonstration, Tactile cues, and Verbal cues Education comprehension: verbalized understanding, further training needed  HOME EXERCISE PROGRAM: -grasping and stacking 1, and 1/2 cubes form the tabletop surface.  GOALS: Goals reviewed with patient? Yes  SHORT TERM GOALS: Target date: 02/11/2024   Pt. Will be independent with HEP for the LUE.  Baseline: 12/31/23: Independent; ongoing 12/03/23: indep with current HEP, though ongoing progressions are being made as UE function improves; 10/29/23: Pt. requires assist from wife with HEPs. Pt. Is consistently working on HEP's at home. 09/19/23: Requires assist from his wife Eval; no current HEP Goal status: Achieved,Ongoing  LONG TERM GOALS: Target date:  03/24/2024  Pt. Will increase L shoulder flexion by 10 degrees to be able to reach up to shelves.  Baseline: 12/31/23: Left: 95 degrees before scaption, 110 of scaption 12/03/23: 90* before onset of scaption; 10/29/23: 111(115) 09/19/23: Pt. Continues to be limited with reaching up to shelves. Eval: L shoulder flexion is 89 (110). Goal status: progressing, Ongoing  2.  Pt. Will increase L shoulder abduction by 10 degrees to assist with underarm hygiene.  Baseline: 12/31/23: Left: 105(118) 12/03/23: 100*; pt able to demo bilat underarm hygiene with set up; 10/29/23: 888(877) 09/19/23: Pt. Continues to require assist with underarm hygiene.Eval: L shoulder abduction  is 98 (120).  Goal status: Progressing, ongoing  3.  Pt. Will improve L wrist extension by 10 degrees to be able to initiate anticipation of grasping for objects.  Baseline: 12/31/23: 30(45) 12/03/23: 30* (45); 10/29/23: 20(55) 09/19/23: Consistent activation noted in the left wrist extensors with facilitation. Eval: -48 (42) Goal status: Ongoing  4.  Pt. Will improve active gross digit extension through 50% of the range to be able to release objects in his hand consistently.  Baseline:12/31/23: Gross digit extension: ~30-40% 12/03/23: Gross digit ext through ~30% of range (limited by flexor tone in the PIPs); 10/28/20: Gross digit extension through 25-50% of the range. 09/19/23: gross digit extension noted through 25% range with facilitation.Eval: gross digit extension is 10% of the range  Goal status: Ongoing  5.  Pt. Will independently demonstrate visual compensatory strategies when navigating through environment and during tabletop tasks. Baseline: 12/03/23: Pt demonstrates consistent head turns to scan to L visual field without cueing; 10/29/2023: Pt. Continues to less cuing for left sided awareness. 09/19/23: Pt. requires fewer cues for left sided awareness Eval: Pt. Requires consistent cuing for L sided awareness.  Goal status: achieved   6. Pt.  Will improve left hand [facility] skills to be able to independently manipulate small objects during ADLs, and IADLs. Baseline: 12/31/23: Pt. Is r=progressing to be able to grasp progressively smaller objects. 12/03/23: Pt removed 3 pegs in >3 min, pulling out more than 3 but repeatedly dropping to floor or table top rather than assessment dish; 10/29/23: 9 Hole Peg Test: Pt. Was able to remove 9 pegs from vertical position on the pegboard in 1 min. & 13 sec. Pt. Is not yet able to pick up pegs from a horizontal position to place them in the pegboard.   Goal status: ongoing   7. Pt will improve left hand function skills as evidenced by improved score to 4 blocks on the Box and Blocks test. Baseline: 12/31/23: 4 blocks in 1 min. 12/03/23: 4 blocks after 3 trials and passive wrist and digit stretching between trials (not yet consistent to achieve 4 blocks); 10/29/23: Box and Blocks Test: 2 Blocks completed in 1 min.   Goal Status: ongoing   ASSESSMENT: CLINICAL IMPRESSION:   Pt. requires cues, assist, and support at the UE, as well as for the theraband during the UE exercises. Pt. Tolerated Estim with the intensity set to 40.  Pt. Requires cues to hold wrist, and digit extension during the off ramp for neuromuscular retraining. Pt continues to benefit from OT services to work on improving LUE functioning to increase engagement of the LUE during daily tasks, and to provide education about compensatory strategies during ADLs/IADLs.   PERFORMANCE DEFICITS: in functional skills including ADLs, IADLs, coordination, dexterity, proprioception, sensation, edema, tone, ROM, strength, pain, Fine motor control, Gross motor control, mobility, balance, endurance, vision, and UE functional use, cognitive skills including perception, and psychosocial skills including coping strategies, environmental adaptation, and routines and behaviors.   IMPAIRMENTS: are limiting patient from ADLs, IADLs, and leisure.   CO-MORBIDITIES: may  have co-morbidities  that affects occupational performance. Patient will benefit from skilled OT to address above impairments and improve overall function.  MODIFICATION OR ASSISTANCE TO COMPLETE EVALUATION: Min-Moderate modification of tasks or assist with assess necessary to complete an evaluation.  OT OCCUPATIONAL PROFILE AND HISTORY: Detailed assessment: Review of records and additional review of physical, cognitive, psychosocial history related to current functional performance.  CLINICAL DECISION MAKING: Moderate - several treatment options, min-mod task modification  necessary  REHAB POTENTIAL: Good  EVALUATION COMPLEXITY: Moderate  PLAN:  OT FREQUENCY: 2x/week  OT DURATION: 12 weeks  PLANNED INTERVENTIONS: 97168 OT Re-evaluation, 97535 self care/ADL training, 02889 therapeutic exercise, 97530 therapeutic activity, 97112 neuromuscular re-education, 97140 manual therapy, 97018 paraffin, 02989 moist heat, 97010 cryotherapy, 97034 contrast bath, 97760 Orthotic Initial, 97763 Orthotic/Prosthetic subsequent, passive range of motion, visual/perceptual remediation/compensation, energy conservation, and DME and/or AE instructions  RECOMMENDED OTHER SERVICES: PT and ST  CONSULTED AND AGREED WITH PLAN OF CARE: Patient and family member/caregiver  PLAN FOR NEXT SESSION: see above  Richardson Otter, MS, OTR/L   01/15/2024, 3:26 PM

## 2024-01-16 ENCOUNTER — Ambulatory Visit: Admitting: Physical Therapy

## 2024-01-16 ENCOUNTER — Encounter: Admitting: Speech Pathology

## 2024-01-16 ENCOUNTER — Encounter: Admitting: Physician Assistant

## 2024-01-16 ENCOUNTER — Ambulatory Visit

## 2024-01-16 ENCOUNTER — Ambulatory Visit: Admitting: Infectious Diseases

## 2024-01-16 DIAGNOSIS — R262 Difficulty in walking, not elsewhere classified: Secondary | ICD-10-CM

## 2024-01-16 DIAGNOSIS — R278 Other lack of coordination: Secondary | ICD-10-CM

## 2024-01-16 DIAGNOSIS — R269 Unspecified abnormalities of gait and mobility: Secondary | ICD-10-CM

## 2024-01-16 DIAGNOSIS — I69354 Hemiplegia and hemiparesis following cerebral infarction affecting left non-dominant side: Secondary | ICD-10-CM

## 2024-01-16 DIAGNOSIS — M6281 Muscle weakness (generalized): Secondary | ICD-10-CM

## 2024-01-16 DIAGNOSIS — E11622 Type 2 diabetes mellitus with other skin ulcer: Secondary | ICD-10-CM | POA: Diagnosis not present

## 2024-01-16 DIAGNOSIS — R2681 Unsteadiness on feet: Secondary | ICD-10-CM

## 2024-01-16 NOTE — Therapy (Signed)
 Outpatient Occupational Therapy Neuro Treatment Note  Patient Name: [name]. MRN: [id number] DOB:1950-07-28, 73 y.o., male Today's Date: 01/16/2024  PCP: Alm Rower, MD REFERRING PROVIDER: Hermann Toribio PARAS, PA-C  END OF SESSION:  OT End of Session - 01/16/24 1430     Visit Number 43    Number of Visits 72    Date for Recertification  03/24/24    OT Start Time 1315    OT Stop Time 1400    OT Time Calculation (min) 45 min    Activity Tolerance Patient tolerated treatment well    Behavior During Therapy WFL for tasks assessed/performed          Past Medical History:  Diagnosis Date   Anemia    Cervical myelopathy (HCC)    Chronic venous insufficiency    [name] (chronic kidney disease), stage III (HCC)    Diabetes mellitus without complication (HCC)    GERD (gastroesophageal reflux disease)    Hypercholesteremia    Hypothyroidism    Kidney stones    about 50 in the past--last in March 4/24   Leg weakness, bilateral    due to Myasthenia Gravis   Myasthenia gravis (HCC)    Spinal stenosis    Vitamin B 12 deficiency    Vitamin B 12 deficiency    Wears hearing aid    bilateral   Past Surgical History:  Procedure Laterality Date   BACK SURGERY  09/2015   CARDIAC CATHETERIZATION     CATARACT EXTRACTION W/ INTRAOCULAR LENS IMPLANT     CATARACT EXTRACTION W/PHACO Right 12/21/2015   Procedure: CATARACT EXTRACTION PHACO AND INTRAOCULAR LENS PLACEMENT (IOC);  Surgeon: Dene Etienne, MD;  Location: [facility];  Service: Ophthalmology;  Laterality: Right;  DIABETIC - insulin  and oral meds   COLONOSCOPY     SHOULDER SURGERY     labrum repair   TONSILLECTOMY     Patient Active Problem List   Diagnosis Date Noted   Acquired left foot drop 01/10/2024   Left spastic hemiplegia (HCC) 01/10/2024   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) 10/11/2023   High blood pressure 08/13/2023   Anemia 08/13/2023    Constipation 08/13/2023   Myasthenia gravis (HCC) 08/13/2023   Acute ischemic right MCA stroke (HCC) 07/19/2023   Lymphedema 12/27/2016   Leg pain 07/15/2016   Chronic venous insufficiency 07/15/2016   Swelling of limb 07/15/2016   Type 2 diabetes mellitus (HCC) 07/15/2016   Hyperlipidemia 07/15/2016   ONSET DATE: 07/13/2023  REFERRING DIAG: Acute ischemic R MCA CVA  THERAPY DIAG: muscle weakness (generalized), other lack of coordination, vision disturbance, hemiplegia and hemiparesis following cerebral infarction affecting left non-dominant side (HCC)  Rationale for Evaluation and Treatment: Rehabilitation  SUBJECTIVE:  SUBJECTIVE STATEMENT: Pt. reports doing well today. Pt accompanied by: [name]  PERTINENT HISTORY: Pt. Was admitted to [facility] on 07/13/2023 after sustaining a CVA while on a trip to the beach. Pt. Was diagnosed with R MCA CVA. Pt. Was admitted to inpatient rehab from 07/19/2023-08/13/2023. Past medical history includes high BP, anemia, and Myasthenia Gravis.   PRECAUTIONS: None  WEIGHT BEARING RESTRICTIONS: No  PAIN: 01/16/24: No pain  FALLS: Has patient fallen in last 6 months? Yes  LIVING ENVIRONMENT: Lives with: lives with their family and lives with their spouse-  Lives in: House/apartment- 2 story home and resides mostly on the 1st floor Stairs: No- Ramped entrance Has following equipment at home:  Vannie, cane, Steadi, shower chair,  handheld shower head, bedside commode  PLOF: Independent and Independent with basic ADLs Independent at home   PATIENT GOALS: Would like to walk 10-20 feet each time. 01/16/24: Update: pt has restarted PT visits and will be continuing to focus on mobility in those sessions.  Pt continues to work towards improving functional use of the LUE during OT visits.   OBJECTIVE:  Note: Objective measures were completed at Evaluation unless otherwise noted.  HAND DOMINANCE: Right  ADLs: Overall ADLs:  Transfers/ambulation  related to ADLs: Eating: Independent, 100% R handed Grooming: independent UB Dressing: Can put on shirt independently LB Dressing: Difficulty with pants and requires assistance getting the pants on his feet, has difficulty putting on shoes and socks Toileting: Tot A for toilet hygiene. Uses Steadi during toilet transfers. Bathing: Requires assist with using the L arm to wash the R arm. Tub Shower transfers: Roll in shower, built in shower chair, grab bars and hand held shower head.  IADLs: Shopping: Total A (Wife typically did the shopping prior to onset)   Light housekeeping: Total A (wife typically performs prior to onset) Meal Prep: Independent with light snack prep Community mobility: No driving, able to get in/out of the car Medication management: Set up assistance from his wife using a pill box (pt. Is responsible for taking medications at the correct time) Financial management: family members check behind him. Handwriting: TBD Hobbies: gardening, wood working and sports- [employer] Work history: Owns a tax business  MOBILITY STATUS: Needs Assist:    POSTURE COMMENTS:   Sitting balance: Good supported sitting balance  FUNCTIONAL OUTCOME MEASURES:  Fugl-Meyer: see below  UPPER EXTREMITY ROM:    Active ROM Right Eval  [facility] Right 09/23/2023 [facility] Left eval Left 09/23/2023 Left 10/29/23 Left 12/02/23 Left 12/31/23  Shoulder flexion   89(110) 109(114) 111(115) 90 before scaption (110) 95 before scaption (110)  Shoulder abduction   98(120) 109(120) 111(122) 100 (110) 105(118)  Shoulder adduction         Shoulder extension         Shoulder internal rotation         Shoulder external rotation         Elbow flexion   120(140) 130(136) 141(145)  146  Elbow extension   -28(-10) -5(0) -18(0)  -3(0)  Wrist flexion   60(60) Rest at 60 50(65) 54(60)    Wrist extension   -48(42) 10(40) 20(50) 30 (45) 30(45)  Wrist ulnar deviation     12(22)    Wrist radial deviation     10(20)    Wrist  pronation     (82)90    Wrist supination     64(74) 70 (75)   (Blank rows = not tested)  Eval: L Digit flexion: 2nd:3cm (0cm) 3rd: 0cm (0cm), 4th: 0cm (0cm), 5th: 2cm (0cm)  09/23/23:  L Digit flexion to [facility]: 2nd: 3.5cm (0cm), 3rd: 4 cm(0cm), 4th 3.5cm (0cm), 5th: 2cm (0cm)  Eval:  L Digit extension: Active Gross digit extension through 10% of ROM  09/23/23:  L Digit extension: Active Gross digit extension through 50% of ROM  10/29/23:  L Digit extension: Active Gross digit extension through 25-50% of ROM  12/02/23:  L digit extension: Active gross ext through 30%   12/31/23:  L digit extension:  Active gross ext through ~30-40%   UPPER EXTREMITY MMT:     MMT Right eval Left eval Left 09/23/2023 Left 10/29/23 Left 12/02/23 Left 12/31/23  Shoulder flexion 5 3- 3-/5 3-/5  3-/5  Shoulder abduction 5 3- 3-/5 3-/5  3-5/  Shoulder adduction        Shoulder extension        Shoulder internal rotation        Shoulder external rotation        Middle trapezius        Lower trapezius        Elbow flexion 5 TBD 3+/5 4-/5 4+/5 4+/5  Elbow extension 5 TBD 4-/5 3-/5 4+/5 4+/5  Wrist flexion   2/5 3-/5 3-   Wrist extension  2- 2/5 2+/5 3- 3-/5  Wrist ulnar deviation        Wrist radial deviation        Wrist pronation    3/5 3+   Wrist supination    3-/5 3-   (Blank rows = not tested)  HAND FUNCTION:  Eval:  Grip strength: Right: 94#, Left: 1#,  Lateral Key Pinch strength: Right: 14#, Left: 3#  10/29/23:  Grip strength: Left: 16# Lateral Key Pinch strength: Left: 5#  12/02/23: L: 5 lbs, R: 98 # Lateral pinch: Left: 5 lbs   12/31/23:   Grip strength: Right: 105#, Left: 14#,   COORDINATION: Box and Blocks: Right: 34 blocks completed in 1 min., Left: 0 blocks completed in 1 min.   10/29/23:  Box and Blocks: Right: 34 blocks completed in 1 min., Left: 2 blocks completed in 1 min.  9 Hole Peg Test: Left: To remove 9 pegs from a vertical position: 1 min. &13  sec.  12/02/23: Box and Blocks: 4 blocks 12/02/23: 4 pegs out in 3 min (multiple pulled and dropped out of the cup)   12/02/23: Box and Blocks Test: 5 blocks in 1 min.  FUGL-MEYER ASSESSMENT   A. Upper Extremity   I. Reflex Activity:   12/12/23   Flexors (biceps) 2   Extensors (triceps) 2   Total 4/4                 II. Volitional Movement within Synergies:   12/12/23   Flexor Synergy:      Shoulder Elevation 2   Shoulder Retraction 2   Shoulder Abduction (at least 90*) 2   External Rotation 1   Elbow Flexion 2   Forearm Supination 1   Extensor Synergy:      Shoulder Adduction/Internal Rotation 2   Elbow Extension 1   Forearm Pronation 2   Total 15/18     III. Movement Combining Synergies:   12/12/23   Hand to Lumbar Spine 1   Shoulder Flexion at 90*, elbow at 0* 0   Pron/Sup w/elbow at 90* and shoulder at 0* 1   Total 2/6     IV. Movement Out of Synergy   12/12/23   Shoulder Abduction to 90*, elbow at 0*, forearm pronated 0   Shoulder Flexion 9)-180*, elbow at 0*, forearm in mid position 0   Pron/sup, elbow at 0* and shoulder between 30-90* of flexion 0   Total 0/6       B. Wrist   12/12/23   Stability of wrist at 15* ext, elbow at 90*, shoulder 0* 2   Repeated flex/ext, elbow at 90*, shoulder 0* 1   Stability of wrist at 15* ext, elbow at 0*, shoulder 30* 0   Flex/ext, elbow 0*, shoulder 30* 0   Circumduction  1   Total 4/10     C. Hand   12/12/23   Mass Flexion 1   Mass Extension 1  Total 2/4       D. Grasp   12/12/23   Hook  0   Thumb Adduction - paper 1   Pincer - pen 0   Cylindrical - cup/can 0   Spherical - tennis ball 1   Total 2/10     E. Coordination/Speed   12/12/23   Tremor 2   Dysmetria 0   Time 1   Total 3/6     FUGL-MEYER ASSESSMENT: UPPER EXTREMITY A-E TOTAL on (date): 32/66   SENSATION: Sensation feels different in both hands.  L hand Light touch- impaired L hand Proprioception- impaired  EDEMA: Has had hx of edema, and came  to Eval session with swelling in the L hand/wrist/forearm.   *pt. has a laceration from his dog on the dorsal aspect of the L hand.   MUSCLE TONE: Increased tone through the UE and hand flexors.   COGNITION: Overall cognitive status: Within functional limits for tasks assessed  VISION: Subjective report: L sided inattention Baseline vision: Wears glasses for reading only Visual history: Right visual occlusion  VISION ASSESSMENT: To be assessed  PERCEPTION: TBD  PRAXIS: Impaired                                                                                                                 TREATMENT DATE: 01/16/2024  Estim attended:  -Tx time: 10 min, 40 mA CC, duty cycle 50%, ramp 5 sec, cycle time 10/10; electrodes placed at LUE proximal and distal forearm, dorsal aspect, to promote wrist and digit extension.  Vc for timing grasp of pill bottles from table top to release into container at table top level in timing with cycle time of stim.  Vc to stabilize pill bottle as needed with the R hand to prevent knocking bottle over with the L hand.   Therapeutic Exercise: -PROM for the LUE including left shoulder flexion, abduction, elbow extension, forearm supination, pronation, wrist extension, digit MP/PIP/and DIP extension to increase ROM, and decrease stiffness -Completed closed chain exes for LUE with yellow theraband for 2 sets 10 reps of each of the following: elbow flex, ext, shoulder flex, abd, horiz abd, ER and IR with elbow at side, and forearm pron/sup.  OT provided intermittent proximal and distal support for above noted exercises and tactile cues for increasing attention to L elbow position and secure grasp of band in palm (looped end of band also helped to maintain grasp of band). Ranges for shoulder flex and abd limited to <60-70* to stay outside of synergistic flexor tone at the elbow.  Self Care: -HEP review; issued yellow band with 1 looped end, issued print out and reviewed  with pt for L shoulder horiz abd, abd, shoulder elevation, shoulder IR/ER, and elbow flex/ext.  Written reminders given on handout for positioning strategies, form, and anchoring techniques of the band using the R hand.  Encouraged pt to begin attempts at above noted exercises at home, and stop any exercises if pt experiences increased pain or is unable to perform without  a lot of substitution patterns. Pt verbalized understanding.   Education: Education details: HEP Person educated: Patient and spouse Education method: Programmer, multimedia, Facilities manager, Actor cues, Verbal cues, and Handouts Education comprehension: verbalized understanding, further training needed  HOME EXERCISE PROGRAM: -grasping and stacking 1, and 1/2 cubes form the tabletop surface; yellow theraband for LUE strengthening   GOALS: Goals reviewed with patient? Yes  SHORT TERM GOALS: Target date: 02/11/2024   Pt. Will be independent with HEP for the LUE.  Baseline: 12/31/23: Independent; ongoing 12/03/23: indep with current HEP, though ongoing progressions are being made as UE function improves; 10/29/23: Pt. requires assist from wife with HEPs. Pt. Is consistently working on HEP's at home. 09/19/23: Requires assist from his wife Eval; no current HEP Goal status: Achieved,Ongoing  LONG TERM GOALS: Target date: 03/24/2024  Pt. Will increase L shoulder flexion by 10 degrees to be able to reach up to shelves.  Baseline: 12/31/23: Left: 95 degrees before scaption, 110 of scaption 12/03/23: 90* before onset of scaption; 10/29/23: 111(115) 09/19/23: Pt. Continues to be limited with reaching up to shelves. Eval: L shoulder flexion is 89 (110). Goal status: progressing, Ongoing  2.  Pt. Will increase L shoulder abduction by 10 degrees to assist with underarm hygiene.  Baseline: 12/31/23: Left: 105(118) 12/03/23: 100*; pt able to demo bilat underarm hygiene with set up; 10/29/23: [phone]) 09/19/23: Pt. Continues to require assist with underarm  hygiene.Eval: L shoulder abduction is 98 (120).  Goal status: Progressing, ongoing  3.  Pt. Will improve L wrist extension by 10 degrees to be able to initiate anticipation of grasping for objects.  Baseline: 12/31/23: 30(45) 12/03/23: 30* (45); 10/29/23: 20(55) 09/19/23: Consistent activation noted in the left wrist extensors with facilitation. Eval: -48 (42) Goal status: Ongoing  4.  Pt. Will improve active gross digit extension through 50% of the range to be able to release objects in his hand consistently.  Baseline:12/31/23: Gross digit extension: ~30-40% 12/03/23: Gross digit ext through ~30% of range (limited by flexor tone in the PIPs); 10/28/20: Gross digit extension through 25-50% of the range. 09/19/23: gross digit extension noted through 25% range with facilitation.Eval: gross digit extension is 10% of the range  Goal status: Ongoing  5.  Pt. Will independently demonstrate visual compensatory strategies when navigating through environment and during tabletop tasks. Baseline: 12/03/23: Pt demonstrates consistent head turns to scan to L visual field without cueing; 10/29/2023: Pt. Continues to less cuing for left sided awareness. 09/19/23: Pt. requires fewer cues for left sided awareness Eval: Pt. Requires consistent cuing for L sided awareness.  Goal status: achieved   6. Pt. Will improve left hand [facility] skills to be able to independently manipulate small objects during ADLs, and IADLs. Baseline: 12/31/23: Pt. Is r=progressing to be able to grasp progressively smaller objects. 12/03/23: Pt removed 3 pegs in >3 min, pulling out more than 3 but repeatedly dropping to floor or table top rather than assessment dish; 10/29/23: 9 Hole Peg Test: Pt. Was able to remove 9 pegs from vertical position on the pegboard in 1 min. & 13 sec. Pt. Is not yet able to pick up pegs from a horizontal position to place them in the pegboard.   Goal status: ongoing   7. Pt will improve left hand function skills as evidenced by  improved score to 4 blocks on the Box and Blocks test. Baseline: 12/31/23: 4 blocks in 1 min. 12/03/23: 4 blocks after 3 trials and passive wrist and digit stretching between trials (not yet  consistent to achieve 4 blocks); 10/29/23: Box and Blocks Test: 2 Blocks completed in 1 min.   Goal Status: ongoing   ASSESSMENT: CLINICAL IMPRESSION:  Pt is demonstrating improved indep with yellow theraband exercises for LUE strengthening.  Pt will plan to begin above noted exercises in the home; will review in upcoming visits.  Pt continues to tolerate Estim within above noted parameters, noting stim to be effective for promoting functional grasp/release activities, specifically grasping pill bottles today and discarding into container.  Pt did require intermittent use of R hand to stabilize bottles on table top while L hand attempted to grasp, and intermittent passive stretching for digit ext between reps of grasping to minimize flexor tone.  Pt continues to benefit from OT services to work on improving LUE functioning to increase engagement of the LUE during daily tasks, and to provide education about compensatory strategies during ADLs/IADLs.   PERFORMANCE DEFICITS: in functional skills including ADLs, IADLs, coordination, dexterity, proprioception, sensation, edema, tone, ROM, strength, pain, Fine motor control, Gross motor control, mobility, balance, endurance, vision, and UE functional use, cognitive skills including perception, and psychosocial skills including coping strategies, environmental adaptation, and routines and behaviors.   IMPAIRMENTS: are limiting patient from ADLs, IADLs, and leisure.   CO-MORBIDITIES: may have co-morbidities  that affects occupational performance. Patient will benefit from skilled OT to address above impairments and improve overall function.  MODIFICATION OR ASSISTANCE TO COMPLETE EVALUATION: Min-Moderate modification of tasks or assist with assess necessary to complete an  evaluation.  OT OCCUPATIONAL PROFILE AND HISTORY: Detailed assessment: Review of records and additional review of physical, cognitive, psychosocial history related to current functional performance.  CLINICAL DECISION MAKING: Moderate - several treatment options, min-mod task modification necessary  REHAB POTENTIAL: Good  EVALUATION COMPLEXITY: Moderate  PLAN:  OT FREQUENCY: 2x/week  OT DURATION: 12 weeks  PLANNED INTERVENTIONS: 97168 OT Re-evaluation, 97535 self care/ADL training, 02889 therapeutic exercise, 97530 therapeutic activity, 97112 neuromuscular re-education, 97140 manual therapy, 97018 paraffin, 02989 moist heat, 97010 cryotherapy, 97034 contrast bath, 97760 Orthotic Initial, 97763 Orthotic/Prosthetic subsequent, passive range of motion, visual/perceptual remediation/compensation, energy conservation, and DME and/or AE instructions  RECOMMENDED OTHER SERVICES: PT and ST  CONSULTED AND AGREED WITH PLAN OF CARE: Patient and family member/caregiver  PLAN FOR NEXT SESSION: see above  Inocente Blazing, MS, OTR/L  01/16/2024, 2:31 PM

## 2024-01-16 NOTE — Therapy (Signed)
 OUTPATIENT PHYSICAL THERAPY TREATMENT  Patient Name: [name]. MRN: [id number] DOB:1950/07/17, 73 y.o., male Today's Date: 01/16/2024 PCP: Dennise Remak, MD  REFERRING PROVIDER:  Pegge Toribio PARAS, PA-C  END OF SESSION:   PT End of Session - 01/16/24 1408     Visit Number 42    Number of Visits 64    Date for Recertification  03/24/24    Authorization Type [employer]    Authorization Time Period [facility] auth#; [id number] A for 16 PT vst from 10/09-01/02/2025 (10/23 is 2 of 16)    Authorization - Visit Number 2    Authorization - Number of Visits 16    Progress Note Due on Visit 50    PT Start Time 1406    PT Stop Time 1449    PT Time Calculation (min) 43 min    Equipment Utilized During Treatment Gait belt    Activity Tolerance Patient tolerated treatment well;No increased pain    Behavior During Therapy WFL for tasks assessed/performed             Past Medical History:  Diagnosis Date   Anemia    Cervical myelopathy (HCC)    Chronic venous insufficiency    [name] (chronic kidney disease), stage III (HCC)    Diabetes mellitus without complication (HCC)    GERD (gastroesophageal reflux disease)    Hypercholesteremia    Hypothyroidism    Kidney stones    about 50 in the past--last in March 4/24   Leg weakness, bilateral    due to Myasthenia Gravis   Myasthenia gravis (HCC)    Spinal stenosis    Vitamin B 12 deficiency    Vitamin B 12 deficiency    Wears hearing aid    bilateral   Past Surgical History:  Procedure Laterality Date   BACK SURGERY  09/2015   CARDIAC CATHETERIZATION     CATARACT EXTRACTION W/ INTRAOCULAR LENS IMPLANT     CATARACT EXTRACTION W/PHACO Right 12/21/2015   Procedure: CATARACT EXTRACTION PHACO AND INTRAOCULAR LENS PLACEMENT (IOC);  Surgeon: Dene Etienne, MD;  Location: [facility];  Service: Ophthalmology;  Laterality: Right;  DIABETIC - insulin  and oral meds   COLONOSCOPY     SHOULDER SURGERY     labrum repair    TONSILLECTOMY     Patient Active Problem List   Diagnosis Date Noted   Acquired left foot drop 01/10/2024   Left spastic hemiplegia (HCC) 01/10/2024   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) 10/11/2023   High blood pressure 08/13/2023   Anemia 08/13/2023   Constipation 08/13/2023   Myasthenia gravis (HCC) 08/13/2023   Acute ischemic right MCA stroke (HCC) 07/19/2023   Lymphedema 12/27/2016   Leg pain 07/15/2016   Chronic venous insufficiency 07/15/2016   Swelling of limb 07/15/2016   Type 2 diabetes mellitus (HCC) 07/15/2016   Hyperlipidemia 07/15/2016    ONSET DATE: 07/19/23  REFERRING DIAG:  P36.488 (ICD-10-CM) - Cerebral infarction due to unspecified occlusion or stenosis of right middle cerebral artery   THERAPY DIAG:  Muscle weakness (generalized)  Other lack of coordination  Hemiplegia and hemiparesis following cerebral infarction affecting left non-dominant side (HCC)  Difficulty in walking, not elsewhere classified  Unsteadiness on feet  Abnormality of gait and mobility  Rationale for Evaluation and Treatment: Rehabilitation  SUBJECTIVE:  SUBJECTIVE STATEMENT:   Pt states he was able to walk up/down his ramp 67ft each direction, totaling 175ft, using his bari-RW yesterday. Patient states he feels safe ambulating on the ramp because it has stable handrails on both sides of him that he knows he could use to recover his balance if needed. Pt states his daughter and wife guard him and bring a chair behind him in event he were to loose his balance, but it was not needed. Pt states he felt good after therapy on Tuesday, was only a little tired. Pt states he tried to do the seated hamstring and DF stretch yesterday. Denies pain and  falls/stumbles.  From session on 01/14/2024: Patient states the MD suggested/recommended patient get an AFO for his L LE and patient has an appointment with [facility] the 2nd week of November for an assessment.     PERTINENT HISTORY:  CVA with Left hemiplegia in April 2025. Pt started on a swedish knee cage at [facility] due to uncontrolled hyperextension in stance. Pt had not been moving much since discharging from [facility], as therapists instructed pt to perform transfers only at home due to high fall risk with ambulation. Continues to have significant weakenss in the LUE with difficulty extending fingers as well as numbness and inattention to the L side of body resulting in multiple cuts on Arm and leg with WC mobility in home. Will be attaining custom power WC from [employer]. Has loaner currently.   PAIN:  Are you having pain? No  PRECAUTIONS: Fall, history of wounds on LLE   *Latex allergy   WEIGHT BEARING RESTRICTIONS: No  FALLS: Has patient fallen in last 6 months? Yes. Number of falls 1  LIVING ENVIRONMENT: Lives with: lives with their spouse Lives in: House/apartment Stairs: Ramps installed while in the Hospital  Has following equipment at home: Vannie - 2 wheeled and Wheelchair (power)  PLOF: Independent with basic ADLs, Independent with household mobility without device, and Independent with community mobility with device with [facility]   PATIENT GOALS: relearn to Walk 15-58ft.   OBJECTIVE:                                                                                                                              TREATMENT DATE: 01/16/2024  Pt arrives in transport chair.   Pt reports performing exercises prescribed during last therapy session, therapist provided HEP printout to ensure patient performing proper form/technique. Educated pt to ensure he is holding the stretch at least 30 seconds.  Pt agreeable to continue participating in higher intensity gait training on the  treadmill. Donned L swedish knee cage for gait.   Goal HR to be at 80% HR max during high intensity gait training: 127bpm   Donned litegait harness.   Pt able to step on/off treadmill in litegait harness for balance support, using B UE support on handrails. Therapist allowing pt increased control of pushing/pulling the litegait machine as added balance  challenge.  Gait training the following trials on the treadmill in litegait harness providing balance support, but not true BWS and using B UE support: at 1.0-1.41mph totaling 169ft  - forward gait 3lb AW on L LE ankle B UE support  After ~160ft pt requires 5x light min A to clear and advance L LE during swing due to L toes catching during swing with fatigue causing poor clearance HR 126bpm and Borg RPE 14-15/20 1 min 53 seconds at 1.0-1.2 mph totaling 163ft - forward gait Transitioned to 2lb AW around ankle and 1lb AW on toes to promote increased ankle DF activation After 59min12sec pt required consistent heavy min A to clear toes and advance L LE during swing HR 149bpm and Borg RPE 16/20 - HR recovering to 120bpm with standing rest break Provided seated rest break - HR recovered to 88bpm 24sec at 0.1-0. totaling 42ft - backwards gait Only intermittent min A to improve L LE posterior step length  Therapist continuing to allow pt opportunities to problem solve how to motor plan improved step mechanics to achieve more reciprocal pattern Pt performing step-to pattern, with variable lead LE, unable to truly progress to reciprocal pattern backwards  Pt able to maintain L hand grasp on treadmill handle today without ACE wrap support.  Pt continues to demo R wt shift bias throughout gait with his head being close to R harness straps throughout.  Stepped off treadmill and doffed litegait harness   Thearpist discussed AFO benefits of only assisting with ankle DF given pt demonstrating toe catching during swing phase today (when  wearing AW resistance). Therapist educated that the AFO would not address hip flexion weakness that is contributing to decreased foot clearance. Therapist also educated on different types of AFOs including: off the shelf carbon fiber vs custom molded and the recommendation to ensure the AFO allows him to use his active ankle movements.   Exited session in transport chair and in the care of his wife.  PATIENT EDUCATION: Education details: knee cage needs assessmnet as it is causeing crossover gait.  Person educated: Patient and Spouse Education method: Explanation, VC/TC/Demo Education comprehension: verbalized understanding  HOME EXERCISE PROGRAM: Access Code: [email or web address] URL: [email or web address] Date: 01/14/2024 Prepared by: Connell Kiss  Exercises - Seated Knee Extension AROM  - 1 x daily - 4 x weekly - 3 sets - 10 reps - Seated Hip Abduction  - 1 x daily - 4 x weekly - 3 sets - 10 reps - Seated March  - 1 x daily - 4 x weekly - 3 sets - 10 reps - Sit to Stand with Counter Support  - 1 x daily - 4 x weekly - 3 sets - 10 reps - Supine Short Arc Quad  - 1 x daily - 4 x weekly - 3 sets - 10 reps - Supine Hip Abduction  - 1 x daily - 4 x weekly - 3 sets - 10 reps - Supine Bridge  - 1 x daily - 4 x weekly - 3 sets - 3 reps - 2sec  hold - Seated Hamstring Stretch with Strap  - 2-3 x daily - 7 x weekly - 2 sets - 30 second to 1 minute hold - Seated Knee Flexion Slide  - 2-3 x daily - 7 x weekly - 2 sets - 30 seconds to 1 minute hold  12/17/2023: verbal instruction to complete repeated sit<>stands with education on safe set-up provided  GOALS: Goals reviewed with patient? Yes  SHORT TERM  GOALS: Target date: 02/11/2024  Patient will be independent in home exercise program to improve strength/mobility for better functional independence with ADLs. Baseline: Initiated on 09/11/2023 10/22/2023: will update when appropriate 11/26/2023: patient participating in transfer and gait  practice in controlled environment with 2 person assistance at home in addition to HEP 12/31/2023: patient participating in gait using bari-RW at home with family assistance Goal status: IN PROGRESS  LONG TERM GOALS: Target date: 03/24/2024  Patient will increase SIS-16 score to equal to or greater than 10points to demonstrate statistically significant improvement in mobility and quality of life.  Baseline: 49/80 09/19/2023: 45/80 10/22/2023: 46/80 11/26/2023: 47/80 12/31/2023: 53/80 Goal status: IN PROGRESS  2.  Patient (> 55 years old) will complete five times sit to stand test in < 15 seconds indicating an increased LE strength and improved balance. Baseline: 1:21min 09/19/2023: 56.39 seconds using R UE support to push-up from armrest of green chair and requiring skilled min A for balance and lifting/lowering  10/22/2023: 27.10 seconds using R UE support to push-up from armrest  8/12: 27.7 sec with RUE pushing from arm rest.  11/26/2023: 37.42 seconds using R UE support to push-up from armrest of green chair and not using L UE support with min-modA for facilitating anterior weight shift and lifting to come to stand secondary to fatigue (would benefit from re-testing at next session) 12/17/2023: 24.59 seconds using R UE support to push-up from armrest of green chair and not using L UE support with CGA/light min A for safety/steadying  Goal status: IN PROGRESS  3.  Patient will increase FIST score by > 6 points to demonstrate decreased fall risk during functional activities Baseline:  43/56  09/23/2023: 53/56 Goal status: MET and UPGRADED  09/23/2023: Patient will increase Berg Balance score to > 45/56 to demonstrate improved balance and decreased fall risk during functional activities and ADLs.  Baseline: 09/23/2023: 14/56 7/31: 20/56 using RW support 11/26/2023: 17/56 without AD support 12/31/2023: 21/56 without AD support Goal status: IN PROGRESS  4.  Patient will increase 10 meter walk test to  >0.23m/s as to improve gait speed for better community ambulation and to reduce fall risk. Baseline: 09/09/2023: 0.103 m/s ( and 37 seconds) using bari-RW with skilled heavy min A and +2 w/c follow for safety  09/19/2023: 0.67m/s using bari-RW with skilled min A of 1 and +2 w/c follow for safety 10/22/2023: 0.1875 m/s using bari-RW with L hand splint, L swedish knee cage, and only skilled light min A of 1 (no wheelchair follow)  8/12: 0.186m/s with RW, and L hand splint. No knee cage on this day.  11/26/2023: Average Normal speed: 0.20 m/s & Average Fast speed: 0.275 m/s using bari-RW with CGA/min A for balance wearing [facility] 12/17/2023: Average Normal speed: 0.32 m/s using bari-RW with CGA & Average Fast speed: 0.36 m/s using bari-RW requiring CGA/light min A  12/31/2023: Average Normal speed: 0.3275 m/s using bari-RW with CGA/light min A for steadying Goal status: IN PROGRESS  5.  Patient will reduce timed up and go to <11 seconds to reduce fall risk and demonstrate improved transfer/gait ability. Baseline: unable to complete turn at 10 ft. Mod-max assist due to poor control of LW on RW, resulting in L lateral LOB  09/09/2023: and 32 seconds using bari-RW with L hand splint and +2 on R side for safety, therapist providing skilled min A on L side (Had pt set-up L hand on RW splint, get L foot in proper positioning, and scoot forward in  seat before starting the timer) 09/19/2023: 62min43 seconds using bari-RW with only skilled min A of 1 (Had pt set-up L hand on RW splint, get L foot in proper positioning, and scoot forward in seat before starting the timer) 7/2: 78sec(1:18.125) with RW and min assist overall.  10/22/2023: 1 min, 30 seconds using bari-RW with L hand splint and skilled light min assist  8/12: 1:01 min with RW and L hand splint.  11/26/2023: 51.57 seconds using bari-RW with hand splint, wearing SKC, and CGA-minA for balance 12/31/2023: 43.88 seconds using bari-RW with hand splint, wearing  SKC, and CGA/occasional light min A   Goal status: IN PROGRESS   ASSESSMENT:  CLINICAL IMPRESSION:  Patient arrives motivated to participate in therapy session. Patient reports performing the L LE hamstring and DF stretch provided at last therapy session. Patient with appointment for AFO consult with [facility] in November. Therapy session focused on high intensity dynamic gait training on treadmill including ankle weight resisted forward gait training and backwards gait training in order to allow patient to safely make errors in his steps to promote motor learning. With use of AWs during forward gait training today, pt does demo L toe catching during swing with decreased L LE foot clearance requiring min A to clear and advance fully with fatigue. Patient demos improved motor planning and sequencing of L LE posterior stepping during backwards gait today; however, still performs primarily step-to pattern, but is able to do so with decreased manual assist from thearpist. Pt will continue to benefit from continued skilled physical therapy intervention to address impairments, improve QOL, and attain therapy goals. Patient's condition has the potential to improve in response to therapy. Maximum improvement is yet to be obtained. The anticipated improvement is attainable and reasonable in a generally predictable time.      OBJECTIVE IMPAIRMENTS: Abnormal gait, cardiopulmonary status limiting activity, decreased activity tolerance, decreased balance, decreased cognition, decreased coordination, decreased endurance, decreased knowledge of condition, decreased knowledge of use of DME, decreased mobility, difficulty walking, decreased ROM, decreased strength, decreased safety awareness, dizziness, hypomobility, increased fascial restrictions, impaired perceived functional ability, increased muscle spasms, impaired sensation, impaired tone, impaired UE functional use, impaired vision/preception, improper body mechanics,  postural dysfunction, and obesity.   ACTIVITY LIMITATIONS: carrying, lifting, bending, sitting, standing, squatting, stairs, transfers, bed mobility, bathing, toileting, dressing, reach over head, hygiene/grooming, locomotion level, and caring for others  PARTICIPATION LIMITATIONS: meal prep, cleaning, laundry, medication management, interpersonal relationship, driving, shopping, community activity, and yard work  PERSONAL FACTORS: Age, Past/current experiences, Time since onset of injury/illness/exacerbation, and 3+ comorbidities: MG, HTN, CVA, lymphedema are also affecting patient's functional outcome.   REHAB POTENTIAL: Good  CLINICAL DECISION MAKING: Unstable/unpredictable  EVALUATION COMPLEXITY: High  PLAN:  PT FREQUENCY: 1-2x/week  PT DURATION: 8 weeks  PLANNED INTERVENTIONS: 97164- PT Re-evaluation, 97750- Physical Performance Testing, 97110-Therapeutic exercises, 97530- Therapeutic activity, [id number]- Neuromuscular re-education, 97535- Self Care, [id number]- Manual therapy, [id number]- Gait training, [id number]- Orthotic Initial, [id number]- Orthotic/Prosthetic subsequent, [phone]- Canalith repositioning, H9716- Electrical stimulation (unattended), [phone]- Electrical stimulation (manual), [id number]- Wound care (first 20 sq cm), 97598- Wound care (each additional 20 sq cm), Patient/Family education, Balance training, Stair training, Taping, Dry Needling, Joint mobilization, Joint manipulation, Vestibular training, Visual/preceptual remediation/compensation, Cognitive remediation, DME instructions, Wheelchair mobility training, Cryotherapy, and Moist heat  PLAN FOR NEXT SESSION:   Add STS to HEP HIGT (high intensity gait training) using litegait harness either on treadmill vs overground Try overground in litegait harness at next visit - decreasing reliance on R  UE support Continue stair navigation training Dynamic gait training using bari-RW vs litegait Backwards Side stepping Obstacle navigation  (turning) Working on forward propulsion with increased L hip extension and forward progression of pelvis over L stance phase Dynamic standing balance of trunk rotation Continue monitoring SKC fit & see if posterior knee strap needs to be adjusted   Connell Kiss, PT, DPT, NCS, CSRS Physical Therapist - [place]  [facility]  2:50 PM 01/16/24

## 2024-01-21 ENCOUNTER — Ambulatory Visit: Admitting: Physical Therapy

## 2024-01-21 ENCOUNTER — Ambulatory Visit: Admitting: Occupational Therapy

## 2024-01-21 DIAGNOSIS — M6281 Muscle weakness (generalized): Secondary | ICD-10-CM

## 2024-01-21 DIAGNOSIS — R2681 Unsteadiness on feet: Secondary | ICD-10-CM

## 2024-01-21 DIAGNOSIS — R269 Unspecified abnormalities of gait and mobility: Secondary | ICD-10-CM

## 2024-01-21 DIAGNOSIS — I69354 Hemiplegia and hemiparesis following cerebral infarction affecting left non-dominant side: Secondary | ICD-10-CM

## 2024-01-21 DIAGNOSIS — R278 Other lack of coordination: Secondary | ICD-10-CM

## 2024-01-21 DIAGNOSIS — R262 Difficulty in walking, not elsewhere classified: Secondary | ICD-10-CM

## 2024-01-21 NOTE — Therapy (Signed)
 Outpatient Occupational Therapy Neuro Treatment Note  Patient Name: [name]. MRN: [id number] DOB:[birth year], 73 y.o., male Today's Date: 01/21/2024  PCP: Alm Rower, MD REFERRING PROVIDER: Hermann Toribio PARAS, PA-C  END OF SESSION:  OT End of Session - 01/21/24 1711     Visit Number 44    Number of Visits 72    Date for Recertification  03/24/24    OT Start Time 1400    OT Stop Time 1445    OT Time Calculation (min) 45 min    Activity Tolerance Patient tolerated treatment well    Behavior During Therapy WFL for tasks assessed/performed          Past Medical History:  Diagnosis Date   Anemia    Cervical myelopathy (HCC)    Chronic venous insufficiency    [name] (chronic kidney disease), stage III (HCC)    Diabetes mellitus without complication (HCC)    GERD (gastroesophageal reflux disease)    Hypercholesteremia    Hypothyroidism    Kidney stones    about 50 in the past--last in March 4/24   Leg weakness, bilateral    due to Myasthenia Gravis   Myasthenia gravis (HCC)    Spinal stenosis    Vitamin B 12 deficiency    Vitamin B 12 deficiency    Wears hearing aid    bilateral   Past Surgical History:  Procedure Laterality Date   BACK SURGERY  09/2015   CARDIAC CATHETERIZATION     CATARACT EXTRACTION W/ INTRAOCULAR LENS IMPLANT     CATARACT EXTRACTION W/PHACO Right 12/21/2015   Procedure: CATARACT EXTRACTION PHACO AND INTRAOCULAR LENS PLACEMENT (IOC);  Surgeon: Dene Etienne, MD;  Location: [facility];  Service: Ophthalmology;  Laterality: Right;  DIABETIC - insulin  and oral meds   COLONOSCOPY     SHOULDER SURGERY     labrum repair   TONSILLECTOMY     Patient Active Problem List   Diagnosis Date Noted   Acquired left foot drop 01/10/2024   Left spastic hemiplegia (HCC) 01/10/2024   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) 10/11/2023   High blood pressure 08/13/2023   Anemia 08/13/2023    Constipation 08/13/2023   Myasthenia gravis (HCC) 08/13/2023   Acute ischemic right MCA stroke (HCC) 07/19/2023   Lymphedema 12/27/2016   Leg pain 07/15/2016   Chronic venous insufficiency 07/15/2016   Swelling of limb 07/15/2016   Type 2 diabetes mellitus (HCC) 07/15/2016   Hyperlipidemia 07/15/2016   ONSET DATE: 07/13/2023  REFERRING DIAG: Acute ischemic R MCA CVA  THERAPY DIAG: muscle weakness (generalized), other lack of coordination, vision disturbance, hemiplegia and hemiparesis following cerebral infarction affecting left non-dominant side (HCC)  Rationale for Evaluation and Treatment: Rehabilitation  SUBJECTIVE:  SUBJECTIVE STATEMENT: Pt. That his grand daughter finished 5th in the state golf championship. Pt accompanied by: [name]  PERTINENT HISTORY: Pt. Was admitted to [facility] on 07/13/2023 after sustaining a CVA while on a trip to the beach. Pt. Was diagnosed with R MCA CVA. Pt. Was admitted to inpatient rehab from 07/19/2023-08/13/2023. Past medical history includes high BP, anemia, and Myasthenia Gravis.   PRECAUTIONS: None  WEIGHT BEARING RESTRICTIONS: No  PAIN: 01/16/24: No pain  FALLS: Has patient fallen in last 6 months? Yes  LIVING ENVIRONMENT: Lives with: lives with their family and lives with their spouse-  Lives in: House/apartment- 2 story home and resides mostly on the 1st floor Stairs: No- Ramped entrance Has following equipment at  home:  Vannie, cane, Steadi, shower chair, handheld shower head, bedside commode  PLOF: Independent and Independent with basic ADLs Independent at home   PATIENT GOALS: Would like to walk 10-20 feet each time. 01/16/24: Update: pt has restarted PT visits and will be continuing to focus on mobility in those sessions.  Pt continues to work towards improving functional use of the LUE during OT visits.   OBJECTIVE:  Note: Objective measures were completed at Evaluation unless otherwise noted.  HAND DOMINANCE:  Right  ADLs: Overall ADLs:  Transfers/ambulation related to ADLs: Eating: Independent, 100% R handed Grooming: independent UB Dressing: Can put on shirt independently LB Dressing: Difficulty with pants and requires assistance getting the pants on his feet, has difficulty putting on shoes and socks Toileting: Tot A for toilet hygiene. Uses Steadi during toilet transfers. Bathing: Requires assist with using the L arm to wash the R arm. Tub Shower transfers: Roll in shower, built in shower chair, grab bars and hand held shower head.  IADLs: Shopping: Total A (Wife typically did the shopping prior to onset)   Light housekeeping: Total A (wife typically performs prior to onset) Meal Prep: Independent with light snack prep Community mobility: No driving, able to get in/out of the car Medication management: Set up assistance from his wife using a pill box (pt. Is responsible for taking medications at the correct time) Financial management: family members check behind him. Handwriting: TBD Hobbies: gardening, wood working and sports- [employer] Work history: Owns a tax business  MOBILITY STATUS: Needs Assist:    POSTURE COMMENTS:   Sitting balance: Good supported sitting balance  FUNCTIONAL OUTCOME MEASURES:  Fugl-Meyer: see below  UPPER EXTREMITY ROM:    Active ROM Right Eval  [facility] Right 09/23/2023 [facility] Left eval Left 09/23/2023 Left 10/29/23 Left 12/02/23 Left 12/31/23  Shoulder flexion   89(110) 109(114) 111(115) 90 before scaption (110) 95 before scaption (110)  Shoulder abduction   98(120) 109(120) 111(122) 100 (110) 105(118)  Shoulder adduction         Shoulder extension         Shoulder internal rotation         Shoulder external rotation         Elbow flexion   120(140) 130(136) 141(145)  146  Elbow extension   -28(-10) -5(0) -18(0)  -3(0)  Wrist flexion   60(60) Rest at 60 50(65) 54(60)    Wrist extension   -48(42) 10(40) 20(50) 30 (45) 30(45)  Wrist ulnar deviation      12(22)    Wrist radial deviation     10(20)    Wrist pronation     (82)90    Wrist supination     64(74) 70 (75)   (Blank rows = not tested)  Eval: L Digit flexion: 2nd:3cm (0cm) 3rd: 0cm (0cm), 4th: 0cm (0cm), 5th: 2cm (0cm)  09/23/23:  L Digit flexion to [facility]: 2nd: 3.5cm (0cm), 3rd: 4 cm(0cm), 4th 3.5cm (0cm), 5th: 2cm (0cm)  Eval:  L Digit extension: Active Gross digit extension through 10% of ROM  09/23/23:  L Digit extension: Active Gross digit extension through 50% of ROM  10/29/23:  L Digit extension: Active Gross digit extension through 25-50% of ROM  12/02/23:  L digit extension: Active gross ext through 30%   12/31/23:  L digit extension:  Active gross ext through ~30-40%   UPPER EXTREMITY MMT:     MMT Right eval Left eval Left 09/23/2023 Left 10/29/23 Left 12/02/23 Left 12/31/23  Shoulder  flexion 5 3- 3-/5 3-/5  3-/5  Shoulder abduction 5 3- 3-/5 3-/5  3-5/  Shoulder adduction        Shoulder extension        Shoulder internal rotation        Shoulder external rotation        Middle trapezius        Lower trapezius        Elbow flexion 5 TBD 3+/5 4-/5 4+/5 4+/5  Elbow extension 5 TBD 4-/5 3-/5 4+/5 4+/5  Wrist flexion   2/5 3-/5 3-   Wrist extension  2- 2/5 2+/5 3- 3-/5  Wrist ulnar deviation        Wrist radial deviation        Wrist pronation    3/5 3+   Wrist supination    3-/5 3-   (Blank rows = not tested)  HAND FUNCTION:  Eval:  Grip strength: Right: 94#, Left: 1#,  Lateral Key Pinch strength: Right: 14#, Left: 3#  10/29/23:  Grip strength: Left: 16# Lateral Key Pinch strength: Left: 5#  12/02/23: L: 5 lbs, R: 98 # Lateral pinch: Left: 5 lbs   12/31/23:   Grip strength: Right: 105#, Left: 14#,   COORDINATION: Box and Blocks: Right: 34 blocks completed in 1 min., Left: 0 blocks completed in 1 min.   10/29/23:  Box and Blocks: Right: 34 blocks completed in 1 min., Left: 2 blocks completed in 1 min.  9 Hole Peg Test: Left: To  remove 9 pegs from a vertical position: 1 min. &13 sec.  12/02/23: Box and Blocks: 4 blocks 12/02/23: 4 pegs out in 3 min (multiple pulled and dropped out of the cup)   12/02/23: Box and Blocks Test: 5 blocks in 1 min.  FUGL-MEYER ASSESSMENT   A. Upper Extremity   I. Reflex Activity:   12/12/23   Flexors (biceps) 2   Extensors (triceps) 2   Total 4/4                 II. Volitional Movement within Synergies:   12/12/23   Flexor Synergy:      Shoulder Elevation 2   Shoulder Retraction 2   Shoulder Abduction (at least 90*) 2   External Rotation 1   Elbow Flexion 2   Forearm Supination 1   Extensor Synergy:      Shoulder Adduction/Internal Rotation 2   Elbow Extension 1   Forearm Pronation 2   Total 15/18     III. Movement Combining Synergies:   12/12/23   Hand to Lumbar Spine 1   Shoulder Flexion at 90*, elbow at 0* 0   Pron/Sup w/elbow at 90* and shoulder at 0* 1   Total 2/6     IV. Movement Out of Synergy   12/12/23   Shoulder Abduction to 90*, elbow at 0*, forearm pronated 0   Shoulder Flexion 9)-180*, elbow at 0*, forearm in mid position 0   Pron/sup, elbow at 0* and shoulder between 30-90* of flexion 0   Total 0/6       B. Wrist   12/12/23   Stability of wrist at 15* ext, elbow at 90*, shoulder 0* 2   Repeated flex/ext, elbow at 90*, shoulder 0* 1   Stability of wrist at 15* ext, elbow at 0*, shoulder 30* 0   Flex/ext, elbow 0*, shoulder 30* 0   Circumduction  1   Total 4/10     C. Hand   12/12/23   Mass  Flexion 1   Mass Extension 1   Total 2/4       D. Grasp   12/12/23   Hook  0   Thumb Adduction - paper 1   Pincer - pen 0   Cylindrical - cup/can 0   Spherical - tennis ball 1   Total 2/10     E. Coordination/Speed   12/12/23   Tremor 2   Dysmetria 0   Time 1   Total 3/6     FUGL-MEYER ASSESSMENT: UPPER EXTREMITY A-E TOTAL on (date): 32/66   SENSATION: Sensation feels different in both hands.  L hand Light touch- impaired L hand  Proprioception- impaired  EDEMA: Has had hx of edema, and came to Eval session with swelling in the L hand/wrist/forearm.   *pt. has a laceration from his dog on the dorsal aspect of the L hand.   MUSCLE TONE: Increased tone through the UE and hand flexors.   COGNITION: Overall cognitive status: Within functional limits for tasks assessed  VISION: Subjective report: L sided inattention Baseline vision: Wears glasses for reading only Visual history: Right visual occlusion  VISION ASSESSMENT: To be assessed  PERCEPTION: TBD  PRAXIS: Impaired                                                                                                                 TREATMENT DATE: 01/21/2024  Manual Therapy:   -Pt. tolerated soft tissue mobilizations for metacarpal spread stretches to prepare the left hand for ROM, there. Ex, and engagement in functional hand use.  -Manual therapy was performed independent of, and in preparation for therapeutic Ex.    Therapeutic Exercise:   -AROM/AAROM/PROM for the LUE including left shoulder flexion, abduction, horizontal abduction, elbow flexion, extension, forearm supination, pronation, wrist extension, digit MP/PIP/and DIP extension to increase ROM, and decrease stiffness  Estim attended:   -Tx time: 10 min, 36-38 mA CC, duty cycle 50%, ramp 5 sec, cycle time 5/5; electrodes placed at LUE proximal and distal forearm, dorsal aspect, to promote wrist and digit extension.    Therapeutic Activities:  -Facilitated functional reaching through multiple diagonal patterns  -Facilitated left hand [facility] skills grasping 1 cubes from a resistive board using a lateral grasp, in combination with reaching out the place them onto a tabletop surface.    Education: Education details:  [facility] skills,  Person educated: Patient and spouse Education method: Explanation, Demonstration, Tactile cues, Verbal cues, and Handouts Education comprehension: verbalized understanding,  further training needed  HOME EXERCISE PROGRAM: -grasping and stacking 1, and 1/2 cubes form the tabletop surface; yellow theraband for LUE strengthening   GOALS: Goals reviewed with patient? Yes  SHORT TERM GOALS: Target date: 02/11/2024   Pt. Will be independent with HEP for the LUE.  Baseline: 12/31/23: Independent; ongoing 12/03/23: indep with current HEP, though ongoing progressions are being made as UE function improves; 10/29/23: Pt. requires assist from wife with HEPs. Pt. Is consistently working on HEP's at home. 09/19/23: Requires assist from his wife Eval; no  current HEP Goal status: Achieved,Ongoing  LONG TERM GOALS: Target date: 03/24/2024  Pt. Will increase L shoulder flexion by 10 degrees to be able to reach up to shelves.  Baseline: 12/31/23: Left: 95 degrees before scaption, 110 of scaption 12/03/23: 90* before onset of scaption; 10/29/23: 111(115) 09/19/23: Pt. Continues to be limited with reaching up to shelves. Eval: L shoulder flexion is 89 (110). Goal status: progressing, Ongoing  2.  Pt. Will increase L shoulder abduction by 10 degrees to assist with underarm hygiene.  Baseline: 12/31/23: Left: 105(118) 12/03/23: 100*; pt able to demo bilat underarm hygiene with set up; 10/29/23: 888(877) 09/19/23: Pt. Continues to require assist with underarm hygiene.Eval: L shoulder abduction is 98 (120).  Goal status: Progressing, ongoing  3.  Pt. Will improve L wrist extension by 10 degrees to be able to initiate anticipation of grasping for objects.  Baseline: 12/31/23: 30(45) 12/03/23: 30* (45); 10/29/23: 20(55) 09/19/23: Consistent activation noted in the left wrist extensors with facilitation. Eval: -48 (42) Goal status: Ongoing  4.  Pt. Will improve active gross digit extension through 50% of the range to be able to release objects in his hand consistently.  Baseline:12/31/23: Gross digit extension: ~30-40% 12/03/23: Gross digit ext through ~30% of range (limited by flexor tone in the  PIPs); 10/28/20: Gross digit extension through 25-50% of the range. 09/19/23: gross digit extension noted through 25% range with facilitation.Eval: gross digit extension is 10% of the range  Goal status: Ongoing  5.  Pt. Will independently demonstrate visual compensatory strategies when navigating through environment and during tabletop tasks. Baseline: 12/03/23: Pt demonstrates consistent head turns to scan to L visual field without cueing; 10/29/2023: Pt. Continues to less cuing for left sided awareness. 09/19/23: Pt. requires fewer cues for left sided awareness Eval: Pt. Requires consistent cuing for L sided awareness.  Goal status: achieved   6. Pt. Will improve left hand [facility] skills to be able to independently manipulate small objects during ADLs, and IADLs. Baseline: 12/31/23: Pt. Is r=progressing to be able to grasp progressively smaller objects. 12/03/23: Pt removed 3 pegs in >3 min, pulling out more than 3 but repeatedly dropping to floor or table top rather than assessment dish; 10/29/23: 9 Hole Peg Test: Pt. Was able to remove 9 pegs from vertical position on the pegboard in 1 min. & 13 sec. Pt. Is not yet able to pick up pegs from a horizontal position to place them in the pegboard.   Goal status: ongoing   7. Pt will improve left hand function skills as evidenced by improved score to 4 blocks on the Box and Blocks test. Baseline: 12/31/23: 4 blocks in 1 min. 12/03/23: 4 blocks after 3 trials and passive wrist and digit stretching between trials (not yet consistent to achieve 4 blocks); 10/29/23: Box and Blocks Test: 2 Blocks completed in 1 min.   Goal Status: ongoing   ASSESSMENT: CLINICAL IMPRESSION:   Pt. was able use his Left UE to reach further towards the back of his head. Pt. reports that he has noticed improvements with moving his left hand/digits. Pt. required the intensity on the EStim to be adjusted from 38 to 36 today. Pt. required the base of the cube board to be repositioned to achieve  the desired pattern of movement. Pt. required support proximally hen reaching out to place the cubes onto the the tabletop surface. Pt continues to benefit from OT services to work on improving LUE functioning to increase engagement of the LUE during daily  tasks, and to provide education about compensatory strategies during ADLs/IADLs.   PERFORMANCE DEFICITS: in functional skills including ADLs, IADLs, coordination, dexterity, proprioception, sensation, edema, tone, ROM, strength, pain, Fine motor control, Gross motor control, mobility, balance, endurance, vision, and UE functional use, cognitive skills including perception, and psychosocial skills including coping strategies, environmental adaptation, and routines and behaviors.   IMPAIRMENTS: are limiting patient from ADLs, IADLs, and leisure.   CO-MORBIDITIES: may have co-morbidities  that affects occupational performance. Patient will benefit from skilled OT to address above impairments and improve overall function.  MODIFICATION OR ASSISTANCE TO COMPLETE EVALUATION: Min-Moderate modification of tasks or assist with assess necessary to complete an evaluation.  OT OCCUPATIONAL PROFILE AND HISTORY: Detailed assessment: Review of records and additional review of physical, cognitive, psychosocial history related to current functional performance.  CLINICAL DECISION MAKING: Moderate - several treatment options, min-mod task modification necessary  REHAB POTENTIAL: Good  EVALUATION COMPLEXITY: Moderate  PLAN:  OT FREQUENCY: 2x/week  OT DURATION: 12 weeks  PLANNED INTERVENTIONS: 97168 OT Re-evaluation, 97535 self care/ADL training, 02889 therapeutic exercise, 97530 therapeutic activity, 97112 neuromuscular re-education, 97140 manual therapy, 97018 paraffin, 02989 moist heat, 97010 cryotherapy, 97034 contrast bath, 97760 Orthotic Initial, 97763 Orthotic/Prosthetic subsequent, passive range of motion, visual/perceptual remediation/compensation,  energy conservation, and DME and/or AE instructions  RECOMMENDED OTHER SERVICES: PT and ST  CONSULTED AND AGREED WITH PLAN OF CARE: Patient and family member/caregiver  PLAN FOR NEXT SESSION: see above  Richardson Otter, MS, OTR/L   01/21/2024, 5:14 PM

## 2024-01-21 NOTE — Therapy (Signed)
 OUTPATIENT PHYSICAL THERAPY TREATMENT  Patient Name: [name]. MRN: [id number] DOB:03-15-1951, 73 y.o., male Today's Date: 01/21/2024 PCP: Dennise Remak, MD  REFERRING PROVIDER:  Pegge Toribio PARAS, PA-C  END OF SESSION:   PT End of Session - 01/21/24 1445     Visit Number 43    Number of Visits 64    Date for Recertification  03/24/24    Authorization Type U[employer]    Authorization Time Period [employer] auth#; [id number] A for 16 PT vst from 10/09-01/02/2025 (10/28 is 3 of 16)    Authorization - Visit Number 3    Authorization - Number of Visits 16    Progress Note Due on Visit 50    PT Start Time 1449    PT Stop Time 1538    PT Time Calculation (min) 49 min    Equipment Utilized During Treatment Gait belt    Activity Tolerance Patient tolerated treatment well;No increased pain    Behavior During Therapy WFL for tasks assessed/performed              Past Medical History:  Diagnosis Date   Anemia    Cervical myelopathy (HCC)    Chronic venous insufficiency    [name] (chronic kidney disease), stage III (HCC)    Diabetes mellitus without complication (HCC)    GERD (gastroesophageal reflux disease)    Hypercholesteremia    Hypothyroidism    Kidney stones    about 50 in the past--last in March 4/24   Leg weakness, bilateral    due to Myasthenia Gravis   Myasthenia gravis (HCC)    Spinal stenosis    Vitamin B 12 deficiency    Vitamin B 12 deficiency    Wears hearing aid    bilateral   Past Surgical History:  Procedure Laterality Date   BACK SURGERY  09/2015   CARDIAC CATHETERIZATION     CATARACT EXTRACTION W/ INTRAOCULAR LENS IMPLANT     CATARACT EXTRACTION W/PHACO Right 12/21/2015   Procedure: CATARACT EXTRACTION PHACO AND INTRAOCULAR LENS PLACEMENT (IOC);  Surgeon: Dene Etienne, MD;  Location: [facility];  Service: Ophthalmology;  Laterality: Right;  DIABETIC - insulin  and oral meds   COLONOSCOPY     SHOULDER SURGERY     labrum repair    TONSILLECTOMY     Patient Active Problem List   Diagnosis Date Noted   Acquired left foot drop 01/10/2024   Left spastic hemiplegia (HCC) 01/10/2024   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) 10/11/2023   High blood pressure 08/13/2023   Anemia 08/13/2023   Constipation 08/13/2023   Myasthenia gravis (HCC) 08/13/2023   Acute ischemic right MCA stroke (HCC) 07/19/2023   Lymphedema 12/27/2016   Leg pain 07/15/2016   Chronic venous insufficiency 07/15/2016   Swelling of limb 07/15/2016   Type 2 diabetes mellitus (HCC) 07/15/2016   Hyperlipidemia 07/15/2016    ONSET DATE: 07/19/23  REFERRING DIAG:  P36.488 (ICD-10-CM) - Cerebral infarction due to unspecified occlusion or stenosis of right middle cerebral artery   THERAPY DIAG:  Hemiplegia and hemiparesis following cerebral infarction affecting left non-dominant side (HCC)  Muscle weakness (generalized)  Other lack of coordination  Difficulty in walking, not elsewhere classified  Unsteadiness on feet  Abnormality of gait and mobility  Rationale for Evaluation and Treatment: Rehabilitation  SUBJECTIVE:  SUBJECTIVE STATEMENT:   Pt reports he is doing good. Pt states his granddaughter finished the state golf tournament and came in #5 in the state, so he is really proud. Pt reports walking at home is going fairly well, but he did have 1x where he turned and got off balance so he sat down on the couch to protect himself from falling. Denies pain. Pt reports he has been doing the seated hamstring and DF stretch, therapist reinforced importance of stretches.    From session on 01/14/2024: Patient states the MD suggested/recommended patient get an AFO for his L LE and patient has an appointment with [facility] the 2nd week of November for an assessment.     PERTINENT HISTORY:  CVA with Left hemiplegia in April 2025. Pt started on a swedish knee cage at [facility] due to uncontrolled hyperextension in stance. Pt had not been moving much since discharging from [facility], as therapists instructed pt to perform transfers only at home due to high fall risk with ambulation. Continues to have significant weakenss in the LUE with difficulty extending fingers as well as numbness and inattention to the L side of body resulting in multiple cuts on Arm and leg with WC mobility in home. Will be attaining custom power WC from [employer]. Has loaner currently.   PAIN:  Are you having pain? No  PRECAUTIONS: Fall, history of wounds on LLE   *Latex allergy   WEIGHT BEARING RESTRICTIONS: No  FALLS: Has patient fallen in last 6 months? Yes. Number of falls 1  LIVING ENVIRONMENT: Lives with: lives with their spouse Lives in: House/apartment Stairs: Ramps installed while in the Hospital  Has following equipment at home: Vannie - 2 wheeled and Wheelchair (power)  PLOF: Independent with basic ADLs, Independent with household mobility without device, and Independent with community mobility with device with [facility]   PATIENT GOALS: relearn to Walk 15-23ft.   OBJECTIVE:                                                                                                                              TREATMENT DATE: 01/21/2024  Pt arrives in transport chair.   Pt agreeable to continue participating in higher intensity gait training using litegait overground to address more balance deficits. Donned L swedish knee cage for gait.    Short distance gait training to/from chairs during session between use of litegait using bari-RW and light min A of 1 for balance. At end of session, pt demostrates significant fatigue and decreased L LE foot clearance during swing, requiring verbal cuing to increase attention to this and correct foot  placement due to sensory deficits.  Donned litegait harness.   Gait training overground the following trials in litegait harness for safety, but not providing BWS (body weight support), with thearpist assist for driving litegait. 1.5 laps in hallway (~235ft) with B UE support and goal of patient driving the litegait for increased balance challenge Requires consistent  min A to manage litegait progressed to intermittent Therapist providing verbal cuing throughout for improved L LE foot placement during swing advancement to improve his balance Focus on decreasing L LE adduction and instead maintaining wider BOS Progressed to stepping balance recovery strategies when pt has L anterior LOB and needing to take a quick L anterolateral step and stick the landing to recover his balance - with repetition pt improved in motor plan/sequencing of correct stepping strategy; however, pt noted to rely on R UE support to facilitate R wt shift onto R LE Therapist allowing pt to control litegait when turning (intermittent min A) and therapist providing cuing for sequencing LE steps - with repetition, pt demos improved L LE foot clearance and foot placement when turning to ensure his feet her underneath his BOS Borg RPE scale: 13/20 1 lap in hallway (~16ft) using R HHA from +2 A and L UE support on litegait handle - therapist driving litegait to promote continuous forward movement to maintain steady gait speed  Pt able to progress from step-to pattern leading with L LE to reciprocal pattern, until he becomes fatigued, then regressed back to step-to pattern, but now leading with R LE due to L LE fatigue to advance fully during swing phase Initially therapist cuing for increased and more prolonged L wt shift onto L stance phase to allow reciprocal step on R LE Transitioned to cuing for increased and longer R wt shift onto R stance phase to allow more time to advance L LE during swing, as he fatigues Borg RPE scale:  14/20  Pt will benefit from continued participation in overground gait training with use of litegait for balance safety to further train stepping strategies to maintain balance without reliance on R UE support.  Doffed litegait harness.   Exited session in transport chair and in the care of his wife.    PATIENT EDUCATION: Education details: knee cage needs assessmnet as it is causeing crossover gait.  Person educated: Patient and Spouse Education method: Explanation, VC/TC/Demo Education comprehension: verbalized understanding  HOME EXERCISE PROGRAM: Access Code: [email or web address] URL: [email or web address] Date: 01/14/2024 Prepared by: Connell Kiss  Exercises - Seated Knee Extension AROM  - 1 x daily - 4 x weekly - 3 sets - 10 reps - Seated Hip Abduction  - 1 x daily - 4 x weekly - 3 sets - 10 reps - Seated March  - 1 x daily - 4 x weekly - 3 sets - 10 reps - Sit to Stand with Counter Support  - 1 x daily - 4 x weekly - 3 sets - 10 reps - Supine Short Arc Quad  - 1 x daily - 4 x weekly - 3 sets - 10 reps - Supine Hip Abduction  - 1 x daily - 4 x weekly - 3 sets - 10 reps - Supine Bridge  - 1 x daily - 4 x weekly - 3 sets - 3 reps - 2sec  hold - Seated Hamstring Stretch with Strap  - 2-3 x daily - 7 x weekly - 2 sets - 30 second to 1 minute hold - Seated Knee Flexion Slide  - 2-3 x daily - 7 x weekly - 2 sets - 30 seconds to 1 minute hold  12/17/2023: verbal instruction to complete repeated sit<>stands with education on safe set-up provided  GOALS: Goals reviewed with patient? Yes  SHORT TERM GOALS: Target date: 02/11/2024  Patient will be independent in home exercise program to improve strength/mobility for better functional independence  with ADLs. Baseline: Initiated on 09/11/2023 10/22/2023: will update when appropriate 11/26/2023: patient participating in transfer and gait practice in controlled environment with 2 person assistance at home in addition to HEP 12/31/2023:  patient participating in gait using bari-RW at home with family assistance Goal status: IN PROGRESS  LONG TERM GOALS: Target date: 03/24/2024  Patient will increase SIS-16 score to equal to or greater than 10points to demonstrate statistically significant improvement in mobility and quality of life.  Baseline: 49/80 09/19/2023: 45/80 10/22/2023: 46/80 11/26/2023: 47/80 12/31/2023: 53/80 Goal status: IN PROGRESS  2.  Patient (> 34 years old) will complete five times sit to stand test in < 15 seconds indicating an increased LE strength and improved balance. Baseline: 1:51min 09/19/2023: 56.39 seconds using R UE support to push-up from armrest of green chair and requiring skilled min A for balance and lifting/lowering  10/22/2023: 27.10 seconds using R UE support to push-up from armrest  8/12: 27.7 sec with RUE pushing from arm rest.  11/26/2023: 37.42 seconds using R UE support to push-up from armrest of green chair and not using L UE support with min-modA for facilitating anterior weight shift and lifting to come to stand secondary to fatigue (would benefit from re-testing at next session) 12/17/2023: 24.59 seconds using R UE support to push-up from armrest of green chair and not using L UE support with CGA/light min A for safety/steadying  Goal status: IN PROGRESS  3.  Patient will increase FIST score by > 6 points to demonstrate decreased fall risk during functional activities Baseline:  43/56  09/23/2023: 53/56 Goal status: MET and UPGRADED  09/23/2023: Patient will increase Berg Balance score to > 45/56 to demonstrate improved balance and decreased fall risk during functional activities and ADLs.  Baseline: 09/23/2023: 14/56 7/31: 20/56 using RW support 11/26/2023: 17/56 without AD support 12/31/2023: 21/56 without AD support Goal status: IN PROGRESS  4.  Patient will increase 10 meter walk test to >0.23m/s as to improve gait speed for better community ambulation and to reduce fall  risk. Baseline: 09/09/2023: 0.103 m/s ( and 37 seconds) using bari-RW with skilled heavy min A and +2 w/c follow for safety  09/19/2023: 0.55m/s using bari-RW with skilled min A of 1 and +2 w/c follow for safety 10/22/2023: 0.1875 m/s using bari-RW with L hand splint, L swedish knee cage, and only skilled light min A of 1 (no wheelchair follow)  8/12: 0.171m/s with RW, and L hand splint. No knee cage on this day.  11/26/2023: Average Normal speed: 0.20 m/s & Average Fast speed: 0.275 m/s using bari-RW with CGA/min A for balance wearing [facility] 12/17/2023: Average Normal speed: 0.32 m/s using bari-RW with CGA & Average Fast speed: 0.36 m/s using bari-RW requiring CGA/light min A  12/31/2023: Average Normal speed: 0.3275 m/s using bari-RW with CGA/light min A for steadying Goal status: IN PROGRESS  5.  Patient will reduce timed up and go to <11 seconds to reduce fall risk and demonstrate improved transfer/gait ability. Baseline: unable to complete turn at 10 ft. Mod-max assist due to poor control of LW on RW, resulting in L lateral LOB  09/09/2023: and 32 seconds using bari-RW with L hand splint and +2 on R side for safety, therapist providing skilled min A on L side (Had pt set-up L hand on RW splint, get L foot in proper positioning, and scoot forward in seat before starting the timer) 09/19/2023: 10min43 seconds using bari-RW with only skilled min A of 1 (Had pt set-up  L hand on RW splint, get L foot in proper positioning, and scoot forward in seat before starting the timer) 7/2: 78sec(1:18.125) with RW and min assist overall.  10/22/2023: 1 min, 30 seconds using bari-RW with L hand splint and skilled light min assist  8/12: 1:01 min with RW and L hand splint.  11/26/2023: 51.57 seconds using bari-RW with hand splint, wearing SKC, and CGA-minA for balance 12/31/2023: 43.88 seconds using bari-RW with hand splint, wearing SKC, and CGA/occasional light min A   Goal status: IN  PROGRESS   ASSESSMENT:  CLINICAL IMPRESSION:  Patient arrives motivated to participate in therapy session. Patient with appointment for AFO consult with [facility] in November. Therapy session focused on high intensity dynamic gait training with use of litegait harness in order to allow patient to safely make errors in his steps to promote motor learning of proper stepping balance recovery strategies. Patient requires use of litegait harness to ensure his safety while participating in dynamic gait training activities to allow him to learn how to step L LE appropriately to maintain his balance when he does have a loss of balance. Patient experienced several L anterior LOBs during gait and with therapist cuing for proper wt shifting and stepping strategy, he improved his ability to more quickly step appropriately and catch himself from a LOB. Noticed patient with increasing reliance on R UE support to facilitate weight shifting onto stance limbs; therefore, transitioned to R HHA from +2 A to promote patient learning how to shift weight at his pelvis during gait. This was significantly more challenging for patient, requiring skilled cuing for how and when to shift his weight to allow appropriate steps. Patient will benefit from continued progression of dynamic gait training in litegait harness with R HHA from +2 A to promote improved motor planning and sequencing of LE steps and wt shifting to maintain his balance when turning.  Pt will continue to benefit from continued skilled physical therapy intervention to address impairments, improve QOL, and attain therapy goals. Patient's condition has the potential to improve in response to therapy. Maximum improvement is yet to be obtained. The anticipated improvement is attainable and reasonable in a generally predictable time.      OBJECTIVE IMPAIRMENTS: Abnormal gait, cardiopulmonary status limiting activity, decreased activity tolerance, decreased balance,  decreased cognition, decreased coordination, decreased endurance, decreased knowledge of condition, decreased knowledge of use of DME, decreased mobility, difficulty walking, decreased ROM, decreased strength, decreased safety awareness, dizziness, hypomobility, increased fascial restrictions, impaired perceived functional ability, increased muscle spasms, impaired sensation, impaired tone, impaired UE functional use, impaired vision/preception, improper body mechanics, postural dysfunction, and obesity.   ACTIVITY LIMITATIONS: carrying, lifting, bending, sitting, standing, squatting, stairs, transfers, bed mobility, bathing, toileting, dressing, reach over head, hygiene/grooming, locomotion level, and caring for others  PARTICIPATION LIMITATIONS: meal prep, cleaning, laundry, medication management, interpersonal relationship, driving, shopping, community activity, and yard work  PERSONAL FACTORS: Age, Past/current experiences, Time since onset of injury/illness/exacerbation, and 3+ comorbidities: MG, HTN, CVA, lymphedema are also affecting patient's functional outcome.   REHAB POTENTIAL: Good  CLINICAL DECISION MAKING: Unstable/unpredictable  EVALUATION COMPLEXITY: High  PLAN:  PT FREQUENCY: 1-2x/week  PT DURATION: 8 weeks  PLANNED INTERVENTIONS: 97164- PT Re-evaluation, 97750- Physical Performance Testing, 97110-Therapeutic exercises, 97530- Therapeutic activity, [id number]- Neuromuscular re-education, 97535- Self Care, [id number]- Manual therapy, [id number]- Gait training, [id number]- Orthotic Initial, [id number]- Orthotic/Prosthetic subsequent, [id number]- Canalith repositioning, H9716- Electrical stimulation (unattended), [id number]- Electrical stimulation (manual), [id number]- Wound care (first 20 sq cm), 02401- Wound care (each  additional 20 sq cm), Patient/Family education, Balance training, Stair training, Taping, Dry Needling, Joint mobilization, Joint manipulation, Vestibular training, Visual/preceptual  remediation/compensation, Cognitive remediation, DME instructions, Wheelchair mobility training, Cryotherapy, and Moist heat  PLAN FOR NEXT SESSION:   Add STS to HEP HIGT (high intensity gait training) using litegait harness either on treadmill vs overground Continue overground gait training in litegait harness at next visit - decreasing reliance on R UE support Continue stair navigation training Dynamic gait training using bari-RW vs litegait Backwards Side stepping Obstacle navigation (turning) Working on forward propulsion with increased L hip extension and forward progression of pelvis over L stance phase Dynamic standing balance of trunk rotation Continue monitoring SKC fit & see if posterior knee strap needs to be adjusted   Connell Kiss, PT, DPT, NCS, CSRS Physical Therapist - [place]  [facility]  3:56 PM 01/21/24

## 2024-01-23 ENCOUNTER — Ambulatory Visit: Admitting: Physical Therapy

## 2024-01-23 ENCOUNTER — Ambulatory Visit: Admitting: Occupational Therapy

## 2024-01-23 DIAGNOSIS — R278 Other lack of coordination: Secondary | ICD-10-CM

## 2024-01-23 DIAGNOSIS — R2681 Unsteadiness on feet: Secondary | ICD-10-CM

## 2024-01-23 DIAGNOSIS — M6281 Muscle weakness (generalized): Secondary | ICD-10-CM | POA: Diagnosis not present

## 2024-01-23 DIAGNOSIS — R262 Difficulty in walking, not elsewhere classified: Secondary | ICD-10-CM

## 2024-01-23 DIAGNOSIS — R269 Unspecified abnormalities of gait and mobility: Secondary | ICD-10-CM

## 2024-01-23 DIAGNOSIS — I69354 Hemiplegia and hemiparesis following cerebral infarction affecting left non-dominant side: Secondary | ICD-10-CM

## 2024-01-23 NOTE — Therapy (Signed)
 OUTPATIENT PHYSICAL THERAPY TREATMENT  Patient Name: [name]. MRN: [id number] DOB:1950/07/15, 73 y.o., male Today's Date: 01/23/2024 PCP: Dennise Remak, MD  REFERRING PROVIDER:  Pegge Toribio PARAS, PA-C  END OF SESSION:   PT End of Session - 01/23/24 1319     Visit Number 44    Number of Visits 64    Date for Recertification  03/24/24    Authorization Type [employer]    Authorization Time Period [facility] auth#; [id number] A for 16 PT vst from 10/09-01/02/2025    Authorization - Visit Number 4    Authorization - Number of Visits 16    Progress Note Due on Visit 50    PT Start Time 1318    PT Stop Time 1359    PT Time Calculation (min) 41 min    Equipment Utilized During Treatment Gait belt    Activity Tolerance Patient tolerated treatment well;No increased pain    Behavior During Therapy WFL for tasks assessed/performed               Past Medical History:  Diagnosis Date   Anemia    Cervical myelopathy (HCC)    Chronic venous insufficiency    [name] (chronic kidney disease), stage III (HCC)    Diabetes mellitus without complication (HCC)    GERD (gastroesophageal reflux disease)    Hypercholesteremia    Hypothyroidism    Kidney stones    about 50 in the past--last in March 4/24   Leg weakness, bilateral    due to Myasthenia Gravis   Myasthenia gravis (HCC)    Spinal stenosis    Vitamin B 12 deficiency    Vitamin B 12 deficiency    Wears hearing aid    bilateral   Past Surgical History:  Procedure Laterality Date   BACK SURGERY  09/2015   CARDIAC CATHETERIZATION     CATARACT EXTRACTION W/ INTRAOCULAR LENS IMPLANT     CATARACT EXTRACTION W/PHACO Right 12/21/2015   Procedure: CATARACT EXTRACTION PHACO AND INTRAOCULAR LENS PLACEMENT (IOC);  Surgeon: Dene Etienne, MD;  Location: [facility];  Service: Ophthalmology;  Laterality: Right;  DIABETIC - insulin  and oral meds   COLONOSCOPY     SHOULDER SURGERY     labrum repair   TONSILLECTOMY      Patient Active Problem List   Diagnosis Date Noted   Acquired left foot drop 01/10/2024   Left spastic hemiplegia (HCC) 01/10/2024   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) 10/11/2023   High blood pressure 08/13/2023   Anemia 08/13/2023   Constipation 08/13/2023   Myasthenia gravis (HCC) 08/13/2023   Acute ischemic right MCA stroke (HCC) 07/19/2023   Lymphedema 12/27/2016   Leg pain 07/15/2016   Chronic venous insufficiency 07/15/2016   Swelling of limb 07/15/2016   Type 2 diabetes mellitus (HCC) 07/15/2016   Hyperlipidemia 07/15/2016    ONSET DATE: 07/19/23  REFERRING DIAG:  P36.488 (ICD-10-CM) - Cerebral infarction due to unspecified occlusion or stenosis of right middle cerebral artery   THERAPY DIAG:  Muscle weakness (generalized)  Abnormality of gait and mobility  Other lack of coordination  Hemiplegia and hemiparesis following cerebral infarction affecting left non-dominant side (HCC)  Difficulty in walking, not elsewhere classified  Unsteadiness on feet  Rationale for Evaluation and Treatment: Rehabilitation  SUBJECTIVE:  SUBJECTIVE STATEMENT:   Pt states he is doing good today. Pt states he was super tired after last session, so he didn't do any walking yesterday.   Denies pain. Did some stretching this morning with putting foot on the stool.    From session on 01/14/2024: Patient states the MD suggested/recommended patient get an AFO for his L LE and patient has an appointment with [facility] the 2nd week of November for an assessment.     PERTINENT HISTORY:  CVA with Left hemiplegia in April 2025. Pt started on a swedish knee cage at CIR due to uncontrolled hyperextension in stance. Pt had not been moving much since  discharging from CIR, as therapists instructed pt to perform transfers only at home due to high fall risk with ambulation. Continues to have significant weakenss in the LUE with difficulty extending fingers as well as numbness and inattention to the L side of body resulting in multiple cuts on Arm and leg with WC mobility in home. Will be attaining custom power WC from [employer]. Has loaner currently.   PAIN:  Are you having pain? No  PRECAUTIONS: Fall, history of wounds on LLE   *Latex allergy   WEIGHT BEARING RESTRICTIONS: No  FALLS: Has patient fallen in last 6 months? Yes. Number of falls 1  LIVING ENVIRONMENT: Lives with: lives with their spouse Lives in: House/apartment Stairs: Ramps installed while in the Hospital  Has following equipment at home: Vannie - 2 wheeled and Wheelchair (power)  PLOF: Independent with basic ADLs, Independent with household mobility without device, and Independent with community mobility with device with [facility]   PATIENT GOALS: relearn to Walk 15-35ft.   OBJECTIVE:                                                                                                                              TREATMENT DATE: 01/23/2024  Pt arrives in transport chair.   Pt agreeable to continue participating in higher intensity gait training using litegait overground to address more balance deficits. Donned L swedish knee cage for gait.    Short distance gait training to/from chairs during session between use of litegait using bari-RW and light min A of 1 for balance. At end of session, pt demostrates significant fatigue and decreased L LE foot clearance during swing, requiring verbal cuing to increase attention to this and correct foot placement due to sensory deficits.  Donned litegait harness. Used ace wrap to support LUE at litegait handle.   Gait training overground the following trials in litegait harness for safety, but not providing BWS (body weight support), with  thearpist assist for driving litegait. 1.5 laps in hallway (~265ft) with B UE support and goal of patient driving the litegait for increased balance challenge Requires intermittent min A to manage litegait Therapist providing verbal cuing throughout for improved L LE foot placement during swing advancement to improve his balance Focus on decreasing L LE adduction and instead maintaining  wider BOS Progressed to stepping balance recovery strategies when pt has L anterior LOB and needing to take a quick L anterolateral step and stick the landing to recover his balance - with repetition pt improved in motor plan/sequencing of correct stepping strategy; however, pt noted to rely on R UE support to facilitate R wt shift onto R LE Therapist allowing pt to control litegait when turning (intermittent min A), having pt complete turning to R side this date for increased difficulty, and therapist providing cuing for sequencing LE steps, able to provide less frequent cuing this date compared to previous session  1 lap in hallway (~121ft) using R HHA from +2 A and L UE support on litegait handle - therapist driving litegait to promote continuous forward movement to maintain steady gait speed  Pt able to very quickly progress from step-to pattern leading with L LE to reciprocal pattern for majority of bout Verbal cuing for increased and longer R wt shift onto R stance phase to allow more time to advance L LE during swing Borg RPE scale: 15/20  Pt will benefit from continued participation in overground gait training with use of litegait for balance safety to further train stepping strategies to maintain balance without reliance on R UE support.  Doffed litegait harness.   Exited session in transport chair and in the care of his wife for transport to OT appointment.    PATIENT EDUCATION: Education details: knee cage needs assessmnet as it is causeing crossover gait.  Person educated: Patient and  Spouse Education method: Explanation, VC/TC/Demo Education comprehension: verbalized understanding  HOME EXERCISE PROGRAM: Access Code: [email or web address] URL: [email or web address] Date: 01/14/2024 Prepared by: Connell Kiss  Exercises - Seated Knee Extension AROM  - 1 x daily - 4 x weekly - 3 sets - 10 reps - Seated Hip Abduction  - 1 x daily - 4 x weekly - 3 sets - 10 reps - Seated March  - 1 x daily - 4 x weekly - 3 sets - 10 reps - Sit to Stand with Counter Support  - 1 x daily - 4 x weekly - 3 sets - 10 reps - Supine Short Arc Quad  - 1 x daily - 4 x weekly - 3 sets - 10 reps - Supine Hip Abduction  - 1 x daily - 4 x weekly - 3 sets - 10 reps - Supine Bridge  - 1 x daily - 4 x weekly - 3 sets - 3 reps - 2sec  hold - Seated Hamstring Stretch with Strap  - 2-3 x daily - 7 x weekly - 2 sets - 30 second to 1 minute hold - Seated Knee Flexion Slide  - 2-3 x daily - 7 x weekly - 2 sets - 30 seconds to 1 minute hold  12/17/2023: verbal instruction to complete repeated sit<>stands with education on safe set-up provided  GOALS: Goals reviewed with patient? Yes  SHORT TERM GOALS: Target date: 02/11/2024  Patient will be independent in home exercise program to improve strength/mobility for better functional independence with ADLs. Baseline: Initiated on 09/11/2023 10/22/2023: will update when appropriate 11/26/2023: patient participating in transfer and gait practice in controlled environment with 2 person assistance at home in addition to HEP 12/31/2023: patient participating in gait using bari-RW at home with family assistance Goal status: IN PROGRESS  LONG TERM GOALS: Target date: 03/24/2024  Patient will increase SIS-16 score to equal to or greater than 10points to demonstrate statistically significant improvement in mobility and quality of life.  Baseline: 49/80 09/19/2023: 45/80 10/22/2023: 46/80 11/26/2023: 47/80 12/31/2023: 53/80 Goal status: IN PROGRESS  2.  Patient (> 39  years old) will complete five times sit to stand test in < 15 seconds indicating an increased LE strength and improved balance. Baseline: 1:32min 09/19/2023: 56.39 seconds using R UE support to push-up from armrest of green chair and requiring skilled min A for balance and lifting/lowering  10/22/2023: 27.10 seconds using R UE support to push-up from armrest  8/12: 27.7 sec with RUE pushing from arm rest.  11/26/2023: 37.42 seconds using R UE support to push-up from armrest of green chair and not using L UE support with min-modA for facilitating anterior weight shift and lifting to come to stand secondary to fatigue (would benefit from re-testing at next session) 12/17/2023: 24.59 seconds using R UE support to push-up from armrest of green chair and not using L UE support with CGA/light min A for safety/steadying  Goal status: IN PROGRESS  3.  Patient will increase FIST score by > 6 points to demonstrate decreased fall risk during functional activities Baseline:  43/56  09/23/2023: 53/56 Goal status: MET and UPGRADED  09/23/2023: Patient will increase Berg Balance score to > 45/56 to demonstrate improved balance and decreased fall risk during functional activities and ADLs.  Baseline: 09/23/2023: 14/56 7/31: 20/56 using RW support 11/26/2023: 17/56 without AD support 12/31/2023: 21/56 without AD support Goal status: IN PROGRESS  4.  Patient will increase 10 meter walk test to >0.55m/s as to improve gait speed for better community ambulation and to reduce fall risk. Baseline: 09/09/2023: 0.103 m/s ( and 37 seconds) using bari-RW with skilled heavy min A and +2 w/c follow for safety  09/19/2023: 0.67m/s using bari-RW with skilled min A of 1 and +2 w/c follow for safety 10/22/2023: 0.1875 m/s using bari-RW with L hand splint, L swedish knee cage, and only skilled light min A of 1 (no wheelchair follow)  8/12: 0.172m/s with RW, and L hand splint. No knee cage on this day.  11/26/2023: Average Normal speed:  0.20 m/s & Average Fast speed: 0.275 m/s using bari-RW with CGA/min A for balance wearing [facility] 12/17/2023: Average Normal speed: 0.32 m/s using bari-RW with CGA & Average Fast speed: 0.36 m/s using bari-RW requiring CGA/light min A  12/31/2023: Average Normal speed: 0.3275 m/s using bari-RW with CGA/light min A for steadying Goal status: IN PROGRESS  5.  Patient will reduce timed up and go to <11 seconds to reduce fall risk and demonstrate improved transfer/gait ability. Baseline: unable to complete turn at 10 ft. Mod-max assist due to poor control of LW on RW, resulting in L lateral LOB  09/09/2023: and 32 seconds using bari-RW with L hand splint and +2 on R side for safety, therapist providing skilled min A on L side (Had pt set-up L hand on RW splint, get L foot in proper positioning, and scoot forward in seat before starting the timer) 09/19/2023: 17min43 seconds using bari-RW with only skilled min A of 1 (Had pt set-up L hand on RW splint, get L foot in proper positioning, and scoot forward in seat before starting the timer) 7/2: 78sec(1:18.125) with RW and min assist overall.  10/22/2023: 1 min, 30 seconds using bari-RW with L hand splint and skilled light min assist  8/12: 1:01 min with RW and L hand splint.  11/26/2023: 51.57 seconds using bari-RW with hand splint, wearing SKC, and CGA-minA for balance 12/31/2023: 43.88 seconds using bari-RW with hand splint, wearing SKC,  and CGA/occasional light min A   Goal status: IN PROGRESS   ASSESSMENT:  CLINICAL IMPRESSION:  Patient arrives motivated to participate in therapy session. Patient with appointment for AFO consult with [facility] in November. Therapy session focused on high intensity dynamic gait training with use of litegait harness in order to allow patient to safely make errors in his steps to promote motor learning of proper stepping balance recovery strategies. Patient requires use of litegait harness to ensure his safety while  participating in dynamic gait training activities to allow him to learn how to step L LE appropriately to maintain his balance when he does have a loss of balance. Patient experienced less frequent L anterior LOBs during gait and with therapist cuing for proper wt shifting and stepping strategy, continued to demo improved ability to more quickly step appropriately and catch himself from a LOB. Continued to note patient with increasing reliance on R UE support to facilitate weight shifting onto stance limbs; therefore, transitioned to R HHA from +2 A to promote patient learning how to shift weight at his pelvis during gait. Pt continuing to report increased difficulty with task compared to with R UE support.  Patient able to consistently demonstrate step through pattern during gait with R HHA, greatly improved from last session.  Patient will benefit from continued progression of dynamic gait training in litegait harness with R HHA from +2 A to promote improved motor planning and sequencing of LE steps and wt shifting to maintain his balance when turning.  Pt will continue to benefit from continued skilled physical therapy intervention to address impairments, improve QOL, and attain therapy goals. Patient's condition has the potential to improve in response to therapy. Maximum improvement is yet to be obtained. The anticipated improvement is attainable and reasonable in a generally predictable time.      OBJECTIVE IMPAIRMENTS: Abnormal gait, cardiopulmonary status limiting activity, decreased activity tolerance, decreased balance, decreased cognition, decreased coordination, decreased endurance, decreased knowledge of condition, decreased knowledge of use of DME, decreased mobility, difficulty walking, decreased ROM, decreased strength, decreased safety awareness, dizziness, hypomobility, increased fascial restrictions, impaired perceived functional ability, increased muscle spasms, impaired sensation, impaired  tone, impaired UE functional use, impaired vision/preception, improper body mechanics, postural dysfunction, and obesity.   ACTIVITY LIMITATIONS: carrying, lifting, bending, sitting, standing, squatting, stairs, transfers, bed mobility, bathing, toileting, dressing, reach over head, hygiene/grooming, locomotion level, and caring for others  PARTICIPATION LIMITATIONS: meal prep, cleaning, laundry, medication management, interpersonal relationship, driving, shopping, community activity, and yard work  PERSONAL FACTORS: Age, Past/current experiences, Time since onset of injury/illness/exacerbation, and 3+ comorbidities: MG, HTN, CVA, lymphedema are also affecting patient's functional outcome.   REHAB POTENTIAL: Good  CLINICAL DECISION MAKING: Unstable/unpredictable  EVALUATION COMPLEXITY: High  PLAN:  PT FREQUENCY: 1-2x/week  PT DURATION: 8 weeks  PLANNED INTERVENTIONS: 97164- PT Re-evaluation, 97750- Physical Performance Testing, 97110-Therapeutic exercises, 97530- Therapeutic activity, [id number]- Neuromuscular re-education, 97535- Self Care, [id number]- Manual therapy, [id number]- Gait training, [id number]- Orthotic Initial, [id number]- Orthotic/Prosthetic subsequent, [phone]- Canalith repositioning, H9716- Electrical stimulation (unattended), [phone]- Electrical stimulation (manual), [id number]- Wound care (first 20 sq cm), 97598- Wound care (each additional 20 sq cm), Patient/Family education, Balance training, Stair training, Taping, Dry Needling, Joint mobilization, Joint manipulation, Vestibular training, Visual/preceptual remediation/compensation, Cognitive remediation, DME instructions, Wheelchair mobility training, Cryotherapy, and Moist heat  PLAN FOR NEXT SESSION:   Add STS to HEP HIGT (high intensity gait training) using litegait harness either on treadmill vs overground Continue overground gait training in litegait harness at  next visit - decreasing reliance on R UE support Continue stair navigation  training Dynamic gait training using bari-RW vs litegait Backwards Side stepping Obstacle navigation (turning) Working on forward propulsion with increased L hip extension and forward progression of pelvis over L stance phase Dynamic standing balance of trunk rotation Continue monitoring SKC fit & see if posterior knee strap needs to be adjusted   Chiquita Silvan, SPT Physical Therapy Student - [facility]  [facility]  5:24 PM 01/23/24

## 2024-01-23 NOTE — Therapy (Signed)
 Outpatient Occupational Therapy Neuro Treatment Note  Patient Name: [name]. MRN: [id number] DOB:06-Aug-1950, 73 y.o., male Today's Date: 01/23/2024  PCP: Douglas Rower, MD REFERRING PROVIDER: Hermann Toribio PARAS, PA-C  END OF SESSION:  OT End of Session - 01/23/24 1732     Visit Number 45    Number of Visits 72    Date for Recertification  03/24/24    OT Start Time 0445    OT Stop Time 0100    OT Time Calculation (min) 1215 min    Activity Tolerance Patient tolerated treatment well    Behavior During Therapy [facility] for tasks assessed/performed          Past Medical History:  Diagnosis Date   Anemia    Cervical myelopathy (HCC)    Chronic venous insufficiency    [name] (chronic kidney disease), stage III (HCC)    Diabetes mellitus without complication (HCC)    GERD (gastroesophageal reflux disease)    Hypercholesteremia    Hypothyroidism    Kidney stones    about 50 in the past--last in March 4/24   Leg weakness, bilateral    due to Myasthenia Gravis   Myasthenia gravis (HCC)    Spinal stenosis    Vitamin B 12 deficiency    Vitamin B 12 deficiency    Wears hearing aid    bilateral   Past Surgical History:  Procedure Laterality Date   BACK SURGERY  09/2015   CARDIAC CATHETERIZATION     CATARACT EXTRACTION W/ INTRAOCULAR LENS IMPLANT     CATARACT EXTRACTION W/PHACO Right 12/21/2015   Procedure: CATARACT EXTRACTION PHACO AND INTRAOCULAR LENS PLACEMENT (IOC);  Surgeon: Dene Etienne, MD;  Location: [facility];  Service: Ophthalmology;  Laterality: Right;  DIABETIC - insulin  and oral meds   COLONOSCOPY     SHOULDER SURGERY     labrum repair   TONSILLECTOMY     Patient Active Problem List   Diagnosis Date Noted   Acquired left foot drop 01/10/2024   Left spastic hemiplegia (HCC) 01/10/2024   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) 10/11/2023   High blood pressure 08/13/2023   Anemia 08/13/2023    Constipation 08/13/2023   Myasthenia gravis (HCC) 08/13/2023   Acute ischemic right MCA stroke (HCC) 07/19/2023   Lymphedema 12/27/2016   Leg pain 07/15/2016   Chronic venous insufficiency 07/15/2016   Swelling of limb 07/15/2016   Type 2 diabetes mellitus (HCC) 07/15/2016   Hyperlipidemia 07/15/2016   ONSET DATE: 07/13/2023  REFERRING DIAG: Acute ischemic R MCA CVA  THERAPY DIAG: muscle weakness (generalized), other lack of coordination, vision disturbance, hemiplegia and hemiparesis following cerebral infarction affecting left non-dominant side (HCC)  Rationale for Evaluation and Treatment: Rehabilitation  SUBJECTIVE:  SUBJECTIVE STATEMENT: Pt. That his grand daughter finished 5th in the state golf championship. Pt accompanied by: [name]  PERTINENT HISTORY: Pt. Was admitted to [facility] on 07/13/2023 after sustaining a CVA while on a trip to the beach. Pt. Was diagnosed with R MCA CVA. Pt. Was admitted to inpatient rehab from 07/19/2023-08/13/2023. Past medical history includes high BP, anemia, and Myasthenia Gravis.   PRECAUTIONS: None  WEIGHT BEARING RESTRICTIONS: No  PAIN: 01/16/24: No pain  FALLS: Has patient fallen in last 6 months? Yes  LIVING ENVIRONMENT: Lives with: lives with their family and lives with their spouse-  Lives in: House/apartment- 2 story home and resides mostly on the 1st floor Stairs: No- Ramped entrance Has following equipment at  home:  Vannie, cane, Steadi, shower chair, handheld shower head, bedside commode  PLOF: Independent and Independent with basic ADLs Independent at home   PATIENT GOALS: Would like to walk 10-20 feet each time. 01/16/24: Update: pt has restarted PT visits and will be continuing to focus on mobility in those sessions.  Pt continues to work towards improving functional use of the LUE during OT visits.   OBJECTIVE:  Note: Objective measures were completed at Evaluation unless otherwise noted.  HAND DOMINANCE:  Right  ADLs: Overall ADLs:  Transfers/ambulation related to ADLs: Eating: Independent, 100% R handed Grooming: independent UB Dressing: Can put on shirt independently LB Dressing: Difficulty with pants and requires assistance getting the pants on his feet, has difficulty putting on shoes and socks Toileting: Tot A for toilet hygiene. Uses Steadi during toilet transfers. Bathing: Requires assist with using the L arm to wash the R arm. Tub Shower transfers: Roll in shower, built in shower chair, grab bars and hand held shower head.  IADLs: Shopping: Total A (Wife typically did the shopping prior to onset)   Light housekeeping: Total A (wife typically performs prior to onset) Meal Prep: Independent with light snack prep Community mobility: No driving, able to get in/out of the car Medication management: Set up assistance from his wife using a pill box (pt. Is responsible for taking medications at the correct time) Financial management: family members check behind him. Handwriting: TBD Hobbies: gardening, wood working and sports- [employer] Work history: Owns a tax business  MOBILITY STATUS: Needs Assist:    POSTURE COMMENTS:   Sitting balance: Good supported sitting balance  FUNCTIONAL OUTCOME MEASURES:  Fugl-Meyer: see below  UPPER EXTREMITY ROM:    Active ROM Right Eval  [facility] Right 09/23/2023 [facility] Left eval Left 09/23/2023 Left 10/29/23 Left 12/02/23 Left 12/31/23  Shoulder flexion   89(110) 109(114) 111(115) 90 before scaption (110) 95 before scaption (110)  Shoulder abduction   98(120) 109(120) 111(122) 100 (110) 105(118)  Shoulder adduction         Shoulder extension         Shoulder internal rotation         Shoulder external rotation         Elbow flexion   120(140) 130(136) 141(145)  146  Elbow extension   -28(-10) -5(0) -18(0)  -3(0)  Wrist flexion   60(60) Rest at 60 50(65) 54(60)    Wrist extension   -48(42) 10(40) 20(50) 30 (45) 30(45)  Wrist ulnar deviation      12(22)    Wrist radial deviation     10(20)    Wrist pronation     (82)90    Wrist supination     64(74) 70 (75)   (Blank rows = not tested)  Eval: L Digit flexion: 2nd:3cm (0cm) 3rd: 0cm (0cm), 4th: 0cm (0cm), 5th: 2cm (0cm)  09/23/23:  L Digit flexion to [facility]: 2nd: 3.5cm (0cm), 3rd: 4 cm(0cm), 4th 3.5cm (0cm), 5th: 2cm (0cm)  Eval:  L Digit extension: Active Gross digit extension through 10% of ROM  09/23/23:  L Digit extension: Active Gross digit extension through 50% of ROM  10/29/23:  L Digit extension: Active Gross digit extension through 25-50% of ROM  12/02/23:  L digit extension: Active gross ext through 30%   12/31/23:  L digit extension:  Active gross ext through ~30-40%   UPPER EXTREMITY MMT:     MMT Right eval Left eval Left 09/23/2023 Left 10/29/23 Left 12/02/23 Left 12/31/23  Shoulder  flexion 5 3- 3-/5 3-/5  3-/5  Shoulder abduction 5 3- 3-/5 3-/5  3-5/  Shoulder adduction        Shoulder extension        Shoulder internal rotation        Shoulder external rotation        Middle trapezius        Lower trapezius        Elbow flexion 5 TBD 3+/5 4-/5 4+/5 4+/5  Elbow extension 5 TBD 4-/5 3-/5 4+/5 4+/5  Wrist flexion   2/5 3-/5 3-   Wrist extension  2- 2/5 2+/5 3- 3-/5  Wrist ulnar deviation        Wrist radial deviation        Wrist pronation    3/5 3+   Wrist supination    3-/5 3-   (Blank rows = not tested)  HAND FUNCTION:  Eval:  Grip strength: Right: 94#, Left: 1#,  Lateral Key Pinch strength: Right: 14#, Left: 3#  10/29/23:  Grip strength: Left: 16# Lateral Key Pinch strength: Left: 5#  12/02/23: L: 5 lbs, R: 98 # Lateral pinch: Left: 5 lbs   12/31/23:   Grip strength: Right: 105#, Left: 14#,   COORDINATION: Box and Blocks: Right: 34 blocks completed in 1 min., Left: 0 blocks completed in 1 min.   10/29/23:  Box and Blocks: Right: 34 blocks completed in 1 min., Left: 2 blocks completed in 1 min.  9 Hole Peg Test: Left: To  remove 9 pegs from a vertical position: 1 min. &13 sec.  12/02/23: Box and Blocks: 4 blocks 12/02/23: 4 pegs out in 3 min (multiple pulled and dropped out of the cup)   12/02/23: Box and Blocks Test: 5 blocks in 1 min.  FUGL-MEYER ASSESSMENT   A. Upper Extremity   I. Reflex Activity:   12/12/23   Flexors (biceps) 2   Extensors (triceps) 2   Total 4/4                 II. Volitional Movement within Synergies:   12/12/23   Flexor Synergy:      Shoulder Elevation 2   Shoulder Retraction 2   Shoulder Abduction (at least 90*) 2   External Rotation 1   Elbow Flexion 2   Forearm Supination 1   Extensor Synergy:      Shoulder Adduction/Internal Rotation 2   Elbow Extension 1   Forearm Pronation 2   Total 15/18     III. Movement Combining Synergies:   12/12/23   Hand to Lumbar Spine 1   Shoulder Flexion at 90*, elbow at 0* 0   Pron/Sup w/elbow at 90* and shoulder at 0* 1   Total 2/6     IV. Movement Out of Synergy   12/12/23   Shoulder Abduction to 90*, elbow at 0*, forearm pronated 0   Shoulder Flexion 9)-180*, elbow at 0*, forearm in mid position 0   Pron/sup, elbow at 0* and shoulder between 30-90* of flexion 0   Total 0/6       B. Wrist   12/12/23   Stability of wrist at 15* ext, elbow at 90*, shoulder 0* 2   Repeated flex/ext, elbow at 90*, shoulder 0* 1   Stability of wrist at 15* ext, elbow at 0*, shoulder 30* 0   Flex/ext, elbow 0*, shoulder 30* 0   Circumduction  1   Total 4/10     C. Hand   12/12/23   Mass  Flexion 1   Mass Extension 1   Total 2/4       D. Grasp   12/12/23   Hook  0   Thumb Adduction - paper 1   Pincer - pen 0   Cylindrical - cup/can 0   Spherical - tennis ball 1   Total 2/10     E. Coordination/Speed   12/12/23   Tremor 2   Dysmetria 0   Time 1   Total 3/6     FUGL-MEYER ASSESSMENT: UPPER EXTREMITY A-E TOTAL on (date): 32/66   SENSATION: Sensation feels different in both hands.  L hand Light touch- impaired L hand  Proprioception- impaired  EDEMA: Has had hx of edema, and came to Eval session with swelling in the L hand/wrist/forearm.   *pt. has a laceration from his dog on the dorsal aspect of the L hand.   MUSCLE TONE: Increased tone through the UE and hand flexors.   COGNITION: Overall cognitive status: Within functional limits for tasks assessed  VISION: Subjective report: L sided inattention Baseline vision: Wears glasses for reading only Visual history: Right visual occlusion  VISION ASSESSMENT: To be assessed  PERCEPTION: TBD  PRAXIS: Impaired                                                                                                                 TREATMENT DATE: 01/23/2024  Therapeutic Exercise:   -AROM/AAROM/PROM for the LUE including left shoulder flexion, abduction, horizontal abduction, elbow flexion, extension, forearm supination, pronation, wrist extension, digit MP/PIP/and DIP extension to increase ROM, and decrease stiffness  Estim attended:   -Tx time: 10 min, 40mA CC, duty cycle 50%, ramp 5 sec, cycle time 5/5; electrodes placed at LUE proximal and distal forearm, dorsal aspect, to promote wrist and digit extension.    Therapeutic Activities:  -Facilitated functional reaching through multiple diagonal patterns  -Facilitated left hand [facility] skills grasping reaching out in front of him to grasp 1 cubes from a resistive board using a lateral grasp, in combination with left forearm supination while holding the cube securely, then worked on reaching out to the left, and extending his digits to release them from his hand.  With emphasis placed on digit extension and thumb abduction when releasing the cubes. Patient required the board to be adjusted to a lower position after the initial reps and required assist approximately when reaching with the left upper extremity.     Education: Education details:  [facility] skills,  Person educated: Patient and spouse Education method:  Explanation, Demonstration, Tactile cues, Verbal cues, and Handouts Education comprehension: verbalized understanding, further training needed  HOME EXERCISE PROGRAM: -grasping and stacking 1, and 1/2 cubes form the tabletop surface; yellow theraband for LUE strengthening   GOALS: Goals reviewed with patient? Yes  SHORT TERM GOALS: Target date: 02/11/2024   Pt. Will be independent with HEP for the LUE.  Baseline: 12/31/23: Independent; ongoing 12/03/23: indep with current HEP, though ongoing progressions are being made as UE function improves; 10/29/23: Pt. requires  assist from wife with HEPs. Pt. Is consistently working on HEP's at home. 09/19/23: Requires assist from his wife Eval; no current HEP Goal status: Achieved,Ongoing  LONG TERM GOALS: Target date: 03/24/2024  Pt. Will increase L shoulder flexion by 10 degrees to be able to reach up to shelves.  Baseline: 12/31/23: Left: 95 degrees before scaption, 110 of scaption 12/03/23: 90* before onset of scaption; 10/29/23: 111(115) 09/19/23: Pt. Continues to be limited with reaching up to shelves. Eval: L shoulder flexion is 89 (110). Goal status: progressing, Ongoing  2.  Pt. Will increase L shoulder abduction by 10 degrees to assist with underarm hygiene.  Baseline: 12/31/23: Left: 105(118) 12/03/23: 100*; pt able to demo bilat underarm hygiene with set up; 10/29/23: 888(877) 09/19/23: Pt. Continues to require assist with underarm hygiene.Eval: L shoulder abduction is 98 (120).  Goal status: Progressing, ongoing  3.  Pt. Will improve L wrist extension by 10 degrees to be able to initiate anticipation of grasping for objects.  Baseline: 12/31/23: 30(45) 12/03/23: 30* (45); 10/29/23: 20(55) 09/19/23: Consistent activation noted in the left wrist extensors with facilitation. Eval: -48 (42) Goal status: Ongoing  4.  Pt. Will improve active gross digit extension through 50% of the range to be able to release objects in his hand consistently.   Baseline:12/31/23: Gross digit extension: ~30-40% 12/03/23: Gross digit ext through ~30% of range (limited by flexor tone in the PIPs); 10/28/20: Gross digit extension through 25-50% of the range. 09/19/23: gross digit extension noted through 25% range with facilitation.Eval: gross digit extension is 10% of the range  Goal status: Ongoing  5.  Pt. Will independently demonstrate visual compensatory strategies when navigating through environment and during tabletop tasks. Baseline: 12/03/23: Pt demonstrates consistent head turns to scan to L visual field without cueing; 10/29/2023: Pt. Continues to less cuing for left sided awareness. 09/19/23: Pt. requires fewer cues for left sided awareness Eval: Pt. Requires consistent cuing for L sided awareness.  Goal status: achieved   6. Pt. Will improve left hand [facility] skills to be able to independently manipulate small objects during ADLs, and IADLs. Baseline: 12/31/23: Pt. Is r=progressing to be able to grasp progressively smaller objects. 12/03/23: Pt removed 3 pegs in >3 min, pulling out more than 3 but repeatedly dropping to floor or table top rather than assessment dish; 10/29/23: 9 Hole Peg Test: Pt. Was able to remove 9 pegs from vertical position on the pegboard in 1 min. & 13 sec. Pt. Is not yet able to pick up pegs from a horizontal position to place them in the pegboard.   Goal status: ongoing   7. Pt will improve left hand function skills as evidenced by improved score to 4 blocks on the Box and Blocks test. Baseline: 12/31/23: 4 blocks in 1 min. 12/03/23: 4 blocks after 3 trials and passive wrist and digit stretching between trials (not yet consistent to achieve 4 blocks); 10/29/23: Box and Blocks Test: 2 Blocks completed in 1 min.   Goal Status: ongoing   ASSESSMENT: CLINICAL IMPRESSION:   Upon arrival patient reports that he had an opportunity to speak with a person from [place]  is using [employer]. Pt. was able to grasp the cubes with his left hand and  securely hold them while performing forearm supination. Patient required cues to keep the elbow at his side during supination reps. Patient required the board to be adjusted to a lower position after the initial reps of reaching forward with the left upper extremity. During each rep  patient required assist proximally with the left upper extremity. Patient occasionally required assist to secure a lateral grasp on the cubes with the left hand. Pt continues to benefit from OT services to work on improving LUE functioning to increase engagement of the LUE during daily tasks, and to provide education about compensatory strategies during ADLs/IADLs.   PERFORMANCE DEFICITS: in functional skills including ADLs, IADLs, coordination, dexterity, proprioception, sensation, edema, tone, ROM, strength, pain, Fine motor control, Gross motor control, mobility, balance, endurance, vision, and UE functional use, cognitive skills including perception, and psychosocial skills including coping strategies, environmental adaptation, and routines and behaviors.   IMPAIRMENTS: are limiting patient from ADLs, IADLs, and leisure.   CO-MORBIDITIES: may have co-morbidities  that affects occupational performance. Patient will benefit from skilled OT to address above impairments and improve overall function.  MODIFICATION OR ASSISTANCE TO COMPLETE EVALUATION: Min-Moderate modification of tasks or assist with assess necessary to complete an evaluation.  OT OCCUPATIONAL PROFILE AND HISTORY: Detailed assessment: Review of records and additional review of physical, cognitive, psychosocial history related to current functional performance.  CLINICAL DECISION MAKING: Moderate - several treatment options, min-mod task modification necessary  REHAB POTENTIAL: Good  EVALUATION COMPLEXITY: Moderate  PLAN:  OT FREQUENCY: 2x/week  OT DURATION: 12 weeks  PLANNED INTERVENTIONS: 97168 OT Re-evaluation, 97535 self care/ADL training, 02889  therapeutic exercise, 97530 therapeutic activity, 97112 neuromuscular re-education, 97140 manual therapy, 97018 paraffin, 02989 moist heat, 97010 cryotherapy, 97034 contrast bath, 97760 Orthotic Initial, 97763 Orthotic/Prosthetic subsequent, passive range of motion, visual/perceptual remediation/compensation, energy conservation, and DME and/or AE instructions  RECOMMENDED OTHER SERVICES: PT and ST  CONSULTED AND AGREED WITH PLAN OF CARE: Patient and family member/caregiver  PLAN FOR NEXT SESSION: see above  Richardson Otter, MS, OTR/L   01/23/2024, 5:34 PM

## 2024-01-28 ENCOUNTER — Ambulatory Visit: Admitting: Infectious Diseases

## 2024-01-28 ENCOUNTER — Ambulatory Visit: Admitting: Occupational Therapy

## 2024-01-28 ENCOUNTER — Ambulatory Visit: Attending: Physician Assistant | Admitting: Physical Therapy

## 2024-01-28 DIAGNOSIS — R2681 Unsteadiness on feet: Secondary | ICD-10-CM | POA: Insufficient documentation

## 2024-01-28 DIAGNOSIS — I69354 Hemiplegia and hemiparesis following cerebral infarction affecting left non-dominant side: Secondary | ICD-10-CM | POA: Diagnosis present

## 2024-01-28 DIAGNOSIS — R262 Difficulty in walking, not elsewhere classified: Secondary | ICD-10-CM | POA: Diagnosis present

## 2024-01-28 DIAGNOSIS — R278 Other lack of coordination: Secondary | ICD-10-CM | POA: Diagnosis present

## 2024-01-28 DIAGNOSIS — M6281 Muscle weakness (generalized): Secondary | ICD-10-CM

## 2024-01-28 DIAGNOSIS — R269 Unspecified abnormalities of gait and mobility: Secondary | ICD-10-CM | POA: Diagnosis present

## 2024-01-28 DIAGNOSIS — R41841 Cognitive communication deficit: Secondary | ICD-10-CM | POA: Insufficient documentation

## 2024-01-28 DIAGNOSIS — H539 Unspecified visual disturbance: Secondary | ICD-10-CM | POA: Insufficient documentation

## 2024-01-28 NOTE — Therapy (Signed)
 OUTPATIENT PHYSICAL THERAPY TREATMENT  Patient Name: Janathan Bribiesca. MRN: [id number] DOB:1950-05-29, 73 y.o., male Today's Date: 01/28/2024 PCP: Dennise Remak, MD  REFERRING PROVIDER:  Pegge Toribio PARAS, PA-C  END OF SESSION:   PT End of Session - 01/28/24 1407     Visit Number 45    Number of Visits 64    Date for Recertification  03/24/24    Authorization Type [employer]    Authorization Time Period [facility] auth#; [id number] A for 16 PT vst from 10/09-01/02/2025    Authorization - Visit Number 5    Authorization - Number of Visits 16    Progress Note Due on Visit 50    PT Start Time 1320    PT Stop Time 1402    PT Time Calculation (min) 42 min    Equipment Utilized During Treatment Gait belt    Activity Tolerance Patient tolerated treatment well;No increased pain    Behavior During Therapy WFL for tasks assessed/performed                Past Medical History:  Diagnosis Date   Anemia    Cervical myelopathy (HCC)    Chronic venous insufficiency    [name] (chronic kidney disease), stage III (HCC)    Diabetes mellitus without complication (HCC)    GERD (gastroesophageal reflux disease)    Hypercholesteremia    Hypothyroidism    Kidney stones    about 50 in the past--last in March 4/24   Leg weakness, bilateral    due to Myasthenia Gravis   Myasthenia gravis (HCC)    Spinal stenosis    Vitamin B 12 deficiency    Vitamin B 12 deficiency    Wears hearing aid    bilateral   Past Surgical History:  Procedure Laterality Date   BACK SURGERY  09/2015   CARDIAC CATHETERIZATION     CATARACT EXTRACTION W/ INTRAOCULAR LENS IMPLANT     CATARACT EXTRACTION W/PHACO Right 12/21/2015   Procedure: CATARACT EXTRACTION PHACO AND INTRAOCULAR LENS PLACEMENT (IOC);  Surgeon: Dene Etienne, MD;  Location: [facility];  Service: Ophthalmology;  Laterality: Right;  DIABETIC - insulin  and oral meds   COLONOSCOPY     SHOULDER SURGERY     labrum repair   TONSILLECTOMY      Patient Active Problem List   Diagnosis Date Noted   Acquired left foot drop 01/10/2024   Left spastic hemiplegia (HCC) 01/10/2024   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) 10/11/2023   High blood pressure 08/13/2023   Anemia 08/13/2023   Constipation 08/13/2023   Myasthenia gravis (HCC) 08/13/2023   Acute ischemic right MCA stroke (HCC) 07/19/2023   Lymphedema 12/27/2016   Leg pain 07/15/2016   Chronic venous insufficiency 07/15/2016   Swelling of limb 07/15/2016   Type 2 diabetes mellitus (HCC) 07/15/2016   Hyperlipidemia 07/15/2016    ONSET DATE: 07/19/23  REFERRING DIAG:  P36.488 (ICD-10-CM) - Cerebral infarction due to unspecified occlusion or stenosis of right middle cerebral artery   THERAPY DIAG:  Muscle weakness (generalized)  Hemiplegia and hemiparesis following cerebral infarction affecting left non-dominant side (HCC)  Other lack of coordination  Difficulty in walking, not elsewhere classified  Abnormality of gait and mobility  Unsteadiness on feet  Rationale for Evaluation and Treatment: Rehabilitation  SUBJECTIVE:  SUBJECTIVE STATEMENT:   Pt states he is doing good today. Pt states he was super tired after last session.   Pt states that he walked up & down the ramp at his house a couple times; pt's wife reporting that down & back is ~112' which he ususaly does 2x (totalling 224').  Pt stating that he uses railings with ramp.   Denies pain. Did some stretching with putting foot on the stool. Pt stating that he often does his stretching with use of strap to pulling his toe up to stretch the hamstring.    From session on 01/14/2024: Patient states the MD suggested/recommended patient get an AFO for his L LE and patient has an  appointment with [facility] the 2nd week of November for an assessment.     PERTINENT HISTORY:  CVA with Left hemiplegia in April 2025. Pt started on a swedish knee cage at CIR due to uncontrolled hyperextension in stance. Pt had not been moving much since discharging from [facility], as therapists instructed pt to perform transfers only at home due to high fall risk with ambulation. Continues to have significant weakenss in the LUE with difficulty extending fingers as well as numbness and inattention to the L side of body resulting in multiple cuts on Arm and leg with WC mobility in home. Will be attaining custom power WC from [employer]. Has loaner currently.   PAIN:  Are you having pain? No  PRECAUTIONS: Fall, history of wounds on LLE   *Latex allergy   WEIGHT BEARING RESTRICTIONS: No  FALLS: Has patient fallen in last 6 months? Yes. Number of falls 1  LIVING ENVIRONMENT: Lives with: lives with their spouse Lives in: House/apartment Stairs: Ramps installed while in the Hospital  Has following equipment at home: Vannie - 2 wheeled and Wheelchair (power)  PLOF: Independent with basic ADLs, Independent with household mobility without device, and Independent with community mobility with device with [facility]   PATIENT GOALS: relearn to Walk 15-65ft.   OBJECTIVE:                                                                                                                              TREATMENT DATE: 01/28/2024  Pt arrives in transport chair.   Pt agreeable to continue participating in higher intensity gait training using litegait overground to address more balance deficits. Donned L swedish knee cage for gait.    Transfer from transport chair to green chair using bari-RW and light min A of 1 for balance.  Donned litegait harness.   Gait training overground; the following trials completed in litegait harness for safety, but not providing BWS (body weight support), with therapist assist for  driving litegait. 1 lap  (down & back) in hallway (~16ft) with B UE support and goal of patient driving the litegait for increased balance challenge Requires intermittent min A to manage litegait Therapist providing verbal cuing throughout for improved L LE foot placement during swing  advancement to improve his balance Focus on decreasing L LE adduction and instead maintaining wider BOS Pt continuing to improve in motor plan/sequencing of correct stepping strategy; however, pt noted to rely on R UE support to facilitate R wt shift onto R LE Therapist allowing pt to control litegait when turning (intermittent min A), having pt complete turning to R & L side this date, therapist providing cuing for sequencing LE steps; continuing to decrease verbal cueing  RPE: 13/20 1.5 laps (~250 ft) with R HHA only; therapist driving litegait in order to decrease reliance un UE support Pt able to continue to demonstrate improvements in stepping strategy sequencing Verbal cueing for increased weight shift to promote LLE foot clearance 1 lap down length of hallway (~80 ft) with R HHA, modified litegait to allow rotation of overhead straps Pt with increased difficulty with weight shift onto R stance limb in order to allow L swing phase with decreased support 1 lap down length of hallway (~60 ft) with R HHA retro gait Pt with increased posterior lean, making it difficult to step back with LLE, often with step to pattern Verbal cueing throughout for increased anterior lean, increased step length with LLE RPE: 15/20   Pt will benefit from continued participation in overground gait training with use of litegait for balance safety to further train stepping strategies to maintain balance without reliance on R UE support.  Doffed litegait harness.   Exited session in transport chair and in the care of his wife for transport to OT appointment.    PATIENT EDUCATION: Education details: knee cage needs assessmnet as  it is causeing crossover gait.  Person educated: Patient and Spouse Education method: Explanation, VC/TC/Demo Education comprehension: verbalized understanding  HOME EXERCISE PROGRAM: Access Code: [email or web address] URL: [email or web address] Date: 01/14/2024 Prepared by: Connell Kiss  Exercises - Seated Knee Extension AROM  - 1 x daily - 4 x weekly - 3 sets - 10 reps - Seated Hip Abduction  - 1 x daily - 4 x weekly - 3 sets - 10 reps - Seated March  - 1 x daily - 4 x weekly - 3 sets - 10 reps - Sit to Stand with Counter Support  - 1 x daily - 4 x weekly - 3 sets - 10 reps - Supine Short Arc Quad  - 1 x daily - 4 x weekly - 3 sets - 10 reps - Supine Hip Abduction  - 1 x daily - 4 x weekly - 3 sets - 10 reps - Supine Bridge  - 1 x daily - 4 x weekly - 3 sets - 3 reps - 2sec  hold - Seated Hamstring Stretch with Strap  - 2-3 x daily - 7 x weekly - 2 sets - 30 second to 1 minute hold - Seated Knee Flexion Slide  - 2-3 x daily - 7 x weekly - 2 sets - 30 seconds to 1 minute hold  12/17/2023: verbal instruction to complete repeated sit<>stands with education on safe set-up provided  GOALS: Goals reviewed with patient? Yes  SHORT TERM GOALS: Target date: 02/11/2024  Patient will be independent in home exercise program to improve strength/mobility for better functional independence with ADLs. Baseline: Initiated on 09/11/2023 10/22/2023: will update when appropriate 11/26/2023: patient participating in transfer and gait practice in controlled environment with 2 person assistance at home in addition to HEP 12/31/2023: patient participating in gait using bari-RW at home with family assistance Goal status: IN PROGRESS  LONG TERM GOALS: Target date: 03/24/2024  Patient will increase SIS-16 score to equal to or greater than 10points to demonstrate statistically significant improvement in mobility and quality of life.  Baseline: 49/80 09/19/2023: 45/80 10/22/2023: 46/80 11/26/2023:  47/80 12/31/2023: 53/80 Goal status: IN PROGRESS  2.  Patient (> 24 years old) will complete five times sit to stand test in < 15 seconds indicating an increased LE strength and improved balance. Baseline: 1:40min 09/19/2023: 56.39 seconds using R UE support to push-up from armrest of green chair and requiring skilled min A for balance and lifting/lowering  10/22/2023: 27.10 seconds using R UE support to push-up from armrest  8/12: 27.7 sec with RUE pushing from arm rest.  11/26/2023: 37.42 seconds using R UE support to push-up from armrest of green chair and not using L UE support with min-modA for facilitating anterior weight shift and lifting to come to stand secondary to fatigue (would benefit from re-testing at next session) 12/17/2023: 24.59 seconds using R UE support to push-up from armrest of green chair and not using L UE support with CGA/light min A for safety/steadying  Goal status: IN PROGRESS  3.  Patient will increase FIST score by > 6 points to demonstrate decreased fall risk during functional activities Baseline:  43/56  09/23/2023: 53/56 Goal status: MET and UPGRADED  09/23/2023: Patient will increase Berg Balance score to > 45/56 to demonstrate improved balance and decreased fall risk during functional activities and ADLs.  Baseline: 09/23/2023: 14/56 7/31: 20/56 using RW support 11/26/2023: 17/56 without AD support 12/31/2023: 21/56 without AD support Goal status: IN PROGRESS  4.  Patient will increase 10 meter walk test to >0.24m/s as to improve gait speed for better community ambulation and to reduce fall risk. Baseline: 09/09/2023: 0.103 m/s ( and 37 seconds) using bari-RW with skilled heavy min A and +2 w/c follow for safety  09/19/2023: 0.49m/s using bari-RW with skilled min A of 1 and +2 w/c follow for safety 10/22/2023: 0.1875 m/s using bari-RW with L hand splint, L swedish knee cage, and only skilled light min A of 1 (no wheelchair follow)  8/12: 0.162m/s with RW, and L  hand splint. No knee cage on this day.  11/26/2023: Average Normal speed: 0.20 m/s & Average Fast speed: 0.275 m/s using bari-RW with CGA/min A for balance wearing [facility] 12/17/2023: Average Normal speed: 0.32 m/s using bari-RW with CGA & Average Fast speed: 0.36 m/s using bari-RW requiring CGA/light min A  12/31/2023: Average Normal speed: 0.3275 m/s using bari-RW with CGA/light min A for steadying Goal status: IN PROGRESS  5.  Patient will reduce timed up and go to <11 seconds to reduce fall risk and demonstrate improved transfer/gait ability. Baseline: unable to complete turn at 10 ft. Mod-max assist due to poor control of LW on RW, resulting in L lateral LOB  09/09/2023: and 32 seconds using bari-RW with L hand splint and +2 on R side for safety, therapist providing skilled min A on L side (Had pt set-up L hand on RW splint, get L foot in proper positioning, and scoot forward in seat before starting the timer) 09/19/2023: 86min43 seconds using bari-RW with only skilled min A of 1 (Had pt set-up L hand on RW splint, get L foot in proper positioning, and scoot forward in seat before starting the timer) 7/2: 78sec(1:18.125) with RW and min assist overall.  10/22/2023: 1 min, 30 seconds using bari-RW with L hand splint and skilled light min assist  8/12: 1:01 min with RW and L hand splint.  11/26/2023: 51.57 seconds using bari-RW with hand splint, wearing SKC, and CGA-minA for balance 12/31/2023: 43.88 seconds using bari-RW with hand splint, wearing SKC, and CGA/occasional light min A   Goal status: IN PROGRESS   ASSESSMENT:  CLINICAL IMPRESSION:  Patient arrives motivated to participate in therapy session. Patient with appointment for AFO consult with [facility] in November. Therapy session focused on high intensity dynamic gait training with use of litegait harness in order to allow patient to safely make errors in his steps to promote motor learning of proper stepping balance recovery strategies.  Patient requires use of litegait harness to ensure his safety while participating in dynamic gait training activities to allow him to learn how to step L LE appropriately to maintain his balance when he does have a loss of balance. Therapist continuing cuing for proper wt shifting and stepping strategy, continued to demo improved ability to more quickly step appropriately and catch himself from a LOB. Continued to note patient with increasing reliance on R UE support to facilitate weight shifting onto stance limbs; therefore, transitioned to R HHA from +2 A to promote patient learning how to shift weight at his pelvis during gait for remainder of session with both forward & retro gait. Pt with significant posterior lean during retro ambulation; requiring extensive verbal cueing for improved anterior weight shift to allow increased L step length.  Patient will benefit from continued progression of dynamic gait training in litegait harness with R HHA from +2 A to promote improved motor planning and sequencing of LE steps and wt shifting to maintain his balance when turning.  Pt will continue to benefit from continued skilled physical therapy intervention to address impairments, improve QOL, and attain therapy goals.     OBJECTIVE IMPAIRMENTS: Abnormal gait, cardiopulmonary status limiting activity, decreased activity tolerance, decreased balance, decreased cognition, decreased coordination, decreased endurance, decreased knowledge of condition, decreased knowledge of use of DME, decreased mobility, difficulty walking, decreased ROM, decreased strength, decreased safety awareness, dizziness, hypomobility, increased fascial restrictions, impaired perceived functional ability, increased muscle spasms, impaired sensation, impaired tone, impaired UE functional use, impaired vision/preception, improper body mechanics, postural dysfunction, and obesity.   ACTIVITY LIMITATIONS: carrying, lifting, bending, sitting,  standing, squatting, stairs, transfers, bed mobility, bathing, toileting, dressing, reach over head, hygiene/grooming, locomotion level, and caring for others  PARTICIPATION LIMITATIONS: meal prep, cleaning, laundry, medication management, interpersonal relationship, driving, shopping, community activity, and yard work  PERSONAL FACTORS: Age, Past/current experiences, Time since onset of injury/illness/exacerbation, and 3+ comorbidities: MG, HTN, CVA, lymphedema are also affecting patient's functional outcome.   REHAB POTENTIAL: Good  CLINICAL DECISION MAKING: Unstable/unpredictable  EVALUATION COMPLEXITY: High  PLAN:  PT FREQUENCY: 1-2x/week  PT DURATION: 8 weeks  PLANNED INTERVENTIONS: 97164- PT Re-evaluation, 97750- Physical Performance Testing, 97110-Therapeutic exercises, 97530- Therapeutic activity, [id number]- Neuromuscular re-education, 97535- Self Care, [id number]- Manual therapy, [id number]- Gait training, [id number]- Orthotic Initial, [id number]- Orthotic/Prosthetic subsequent, [phone]- Canalith repositioning, [id number]- Electrical stimulation (unattended), [phone]- Electrical stimulation (manual), [id number]- Wound care (first 20 sq cm), 97598- Wound care (each additional 20 sq cm), Patient/Family education, Balance training, Stair training, Taping, Dry Needling, Joint mobilization, Joint manipulation, Vestibular training, Visual/preceptual remediation/compensation, Cognitive remediation, DME instructions, Wheelchair mobility training, Cryotherapy, and Moist heat  PLAN FOR NEXT SESSION:   Add STS to HEP HIGT (high intensity gait training) using litegait harness either on treadmill vs overground Continue overground gait training in litegait harness at next visit - decreasing reliance on R UE support Continue stair navigation training Dynamic gait training using  bari-RW vs litegait Backwards Side stepping Obstacle navigation (turning) Working on forward propulsion with increased L hip extension and forward  progression of pelvis over L stance phase Dynamic standing balance of trunk rotation Continue monitoring SKC fit & see if posterior knee strap needs to be adjusted   Chiquita Silvan, SPT Physical Therapy Student - [facility]  [facility]  2:08 PM 01/28/24

## 2024-01-28 NOTE — Therapy (Signed)
 Outpatient Occupational Therapy Neuro Treatment Note  Patient Name: [name]. MRN: [id number] DOB:04-30-1950, 73 y.o., male Today's Date: 01/28/2024  PCP: Alm Rower, MD REFERRING PROVIDER: Hermann Toribio PARAS, PA-C  END OF SESSION:  OT End of Session - 01/28/24 1621     Visit Number 46    Number of Visits 72    Date for Recertification  03/24/24    OT Start Time 1400    OT Stop Time 1445    OT Time Calculation (min) 45 min    Activity Tolerance Patient tolerated treatment well    Behavior During Therapy WFL for tasks assessed/performed          Past Medical History:  Diagnosis Date   Anemia    Cervical myelopathy (HCC)    Chronic venous insufficiency    [name] (chronic kidney disease), stage III (HCC)    Diabetes mellitus without complication (HCC)    GERD (gastroesophageal reflux disease)    Hypercholesteremia    Hypothyroidism    Kidney stones    about 50 in the past--last in March 4/24   Leg weakness, bilateral    due to Myasthenia Gravis   Myasthenia gravis (HCC)    Spinal stenosis    Vitamin B 12 deficiency    Vitamin B 12 deficiency    Wears hearing aid    bilateral   Past Surgical History:  Procedure Laterality Date   BACK SURGERY  09/2015   CARDIAC CATHETERIZATION     CATARACT EXTRACTION W/ INTRAOCULAR LENS IMPLANT     CATARACT EXTRACTION W/PHACO Right 12/21/2015   Procedure: CATARACT EXTRACTION PHACO AND INTRAOCULAR LENS PLACEMENT (IOC);  Surgeon: Dene Etienne, MD;  Location: [facility];  Service: Ophthalmology;  Laterality: Right;  DIABETIC - insulin  and oral meds   COLONOSCOPY     SHOULDER SURGERY     labrum repair   TONSILLECTOMY     Patient Active Problem List   Diagnosis Date Noted   Acquired left foot drop 01/10/2024   Left spastic hemiplegia (HCC) 01/10/2024   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) 10/11/2023   High blood pressure 08/13/2023   Anemia 08/13/2023    Constipation 08/13/2023   Myasthenia gravis (HCC) 08/13/2023   Acute ischemic right MCA stroke (HCC) 07/19/2023   Lymphedema 12/27/2016   Leg pain 07/15/2016   Chronic venous insufficiency 07/15/2016   Swelling of limb 07/15/2016   Type 2 diabetes mellitus (HCC) 07/15/2016   Hyperlipidemia 07/15/2016   ONSET DATE: 07/13/2023  REFERRING DIAG: Acute ischemic R MCA CVA  THERAPY DIAG: muscle weakness (generalized), other lack of coordination, vision disturbance, hemiplegia and hemiparesis following cerebral infarction affecting left non-dominant side (HCC)  Rationale for Evaluation and Treatment: Rehabilitation  SUBJECTIVE:  SUBJECTIVE STATEMENT:  Pt. reports that he is waiting for the next steps in the Vivistim process. Pt accompanied by: [name]  PERTINENT HISTORY: Pt. Was admitted to [facility] on 07/13/2023 after sustaining a CVA while on a trip to the beach. Pt. Was diagnosed with R MCA CVA. Pt. Was admitted to inpatient rehab from 07/19/2023-08/13/2023. Past medical history includes high BP, anemia, and Myasthenia Gravis.   PRECAUTIONS: None  WEIGHT BEARING RESTRICTIONS: No  PAIN: 01/16/24: No pain  FALLS: Has patient fallen in last 6 months? Yes  LIVING ENVIRONMENT: Lives with: lives with their family and lives with their spouse-  Lives in: House/apartment- 2 story home and resides mostly on the 1st floor Stairs: No- Ramped entrance Has  following equipment at home:  Vannie, cane, Steadi, shower chair, handheld shower head, bedside commode  PLOF: Independent and Independent with basic ADLs Independent at home   PATIENT GOALS: Would like to walk 10-20 feet each time. 01/16/24: Update: pt has restarted PT visits and will be continuing to focus on mobility in those sessions.  Pt continues to work towards improving functional use of the LUE during OT visits.   OBJECTIVE:  Note: Objective measures were completed at Evaluation unless otherwise noted.  HAND DOMINANCE:  Right  ADLs: Overall ADLs:  Transfers/ambulation related to ADLs: Eating: Independent, 100% R handed Grooming: independent UB Dressing: Can put on shirt independently LB Dressing: Difficulty with pants and requires assistance getting the pants on his feet, has difficulty putting on shoes and socks Toileting: Tot A for toilet hygiene. Uses Steadi during toilet transfers. Bathing: Requires assist with using the L arm to wash the R arm. Tub Shower transfers: Roll in shower, built in shower chair, grab bars and hand held shower head.  IADLs: Shopping: Total A (Wife typically did the shopping prior to onset)   Light housekeeping: Total A (wife typically performs prior to onset) Meal Prep: Independent with light snack prep Community mobility: No driving, able to get in/out of the car Medication management: Set up assistance from his wife using a pill box (pt. Is responsible for taking medications at the correct time) Financial management: family members check behind him. Handwriting: TBD Hobbies: gardening, wood working and sports- [employer] Work history: Owns a tax business  MOBILITY STATUS: Needs Assist:    POSTURE COMMENTS:   Sitting balance: Good supported sitting balance  FUNCTIONAL OUTCOME MEASURES:  Fugl-Meyer: see below  UPPER EXTREMITY ROM:    Active ROM Right Eval  [facility] Right 09/23/2023 [facility] Left eval Left 09/23/2023 Left 10/29/23 Left 12/02/23 Left 12/31/23  Shoulder flexion   89(110) 109(114) 111(115) 90 before scaption (110) 95 before scaption (110)  Shoulder abduction   98(120) 109(120) 111(122) 100 (110) 105(118)  Shoulder adduction         Shoulder extension         Shoulder internal rotation         Shoulder external rotation         Elbow flexion   120(140) 130(136) 141(145)  146  Elbow extension   -28(-10) -5(0) -18(0)  -3(0)  Wrist flexion   60(60) Rest at 60 50(65) 54(60)    Wrist extension   -48(42) 10(40) 20(50) 30 (45) 30(45)  Wrist ulnar deviation      12(22)    Wrist radial deviation     10(20)    Wrist pronation     (82)90    Wrist supination     64(74) 70 (75)   (Blank rows = not tested)  Eval: L Digit flexion: 2nd:3cm (0cm) 3rd: 0cm (0cm), 4th: 0cm (0cm), 5th: 2cm (0cm)  09/23/23:  L Digit flexion to [facility]: 2nd: 3.5cm (0cm), 3rd: 4 cm(0cm), 4th 3.5cm (0cm), 5th: 2cm (0cm)  Eval:  L Digit extension: Active Gross digit extension through 10% of ROM  09/23/23:  L Digit extension: Active Gross digit extension through 50% of ROM  10/29/23:  L Digit extension: Active Gross digit extension through 25-50% of ROM  12/02/23:  L digit extension: Active gross ext through 30%   12/31/23:  L digit extension:  Active gross ext through ~30-40%   UPPER EXTREMITY MMT:     MMT Right eval Left eval Left 09/23/2023 Left 10/29/23 Left 12/02/23 Left  12/31/23  Shoulder flexion 5 3- 3-/5 3-/5  3-/5  Shoulder abduction 5 3- 3-/5 3-/5  3-5/  Shoulder adduction        Shoulder extension        Shoulder internal rotation        Shoulder external rotation        Middle trapezius        Lower trapezius        Elbow flexion 5 TBD 3+/5 4-/5 4+/5 4+/5  Elbow extension 5 TBD 4-/5 3-/5 4+/5 4+/5  Wrist flexion   2/5 3-/5 3-   Wrist extension  2- 2/5 2+/5 3- 3-/5  Wrist ulnar deviation        Wrist radial deviation        Wrist pronation    3/5 3+   Wrist supination    3-/5 3-   (Blank rows = not tested)  HAND FUNCTION:  Eval:  Grip strength: Right: 94#, Left: 1#,  Lateral Key Pinch strength: Right: 14#, Left: 3#  10/29/23:  Grip strength: Left: 16# Lateral Key Pinch strength: Left: 5#  12/02/23: L: 5 lbs, R: 98 # Lateral pinch: Left: 5 lbs   12/31/23:   Grip strength: Right: 105#, Left: 14#,   COORDINATION: Box and Blocks: Right: 34 blocks completed in 1 min., Left: 0 blocks completed in 1 min.   10/29/23:  Box and Blocks: Right: 34 blocks completed in 1 min., Left: 2 blocks completed in 1 min.  9 Hole Peg Test: Left: To  remove 9 pegs from a vertical position: 1 min. &13 sec.  12/02/23: Box and Blocks: 4 blocks 12/02/23: 4 pegs out in 3 min (multiple pulled and dropped out of the cup)   12/02/23: Box and Blocks Test: 5 blocks in 1 min.  FUGL-MEYER ASSESSMENT   A. Upper Extremity   I. Reflex Activity:   12/12/23   Flexors (biceps) 2   Extensors (triceps) 2   Total 4/4                 II. Volitional Movement within Synergies:   12/12/23   Flexor Synergy:      Shoulder Elevation 2   Shoulder Retraction 2   Shoulder Abduction (at least 90*) 2   External Rotation 1   Elbow Flexion 2   Forearm Supination 1   Extensor Synergy:      Shoulder Adduction/Internal Rotation 2   Elbow Extension 1   Forearm Pronation 2   Total 15/18     III. Movement Combining Synergies:   12/12/23   Hand to Lumbar Spine 1   Shoulder Flexion at 90*, elbow at 0* 0   Pron/Sup w/elbow at 90* and shoulder at 0* 1   Total 2/6     IV. Movement Out of Synergy   12/12/23   Shoulder Abduction to 90*, elbow at 0*, forearm pronated 0   Shoulder Flexion 9)-180*, elbow at 0*, forearm in mid position 0   Pron/sup, elbow at 0* and shoulder between 30-90* of flexion 0   Total 0/6       B. Wrist   12/12/23   Stability of wrist at 15* ext, elbow at 90*, shoulder 0* 2   Repeated flex/ext, elbow at 90*, shoulder 0* 1   Stability of wrist at 15* ext, elbow at 0*, shoulder 30* 0   Flex/ext, elbow 0*, shoulder 30* 0   Circumduction  1   Total 4/10     C. Hand   12/12/23  Mass Flexion 1   Mass Extension 1   Total 2/4       D. Grasp   12/12/23   Hook  0   Thumb Adduction - paper 1   Pincer - pen 0   Cylindrical - cup/can 0   Spherical - tennis ball 1   Total 2/10     E. Coordination/Speed   12/12/23   Tremor 2   Dysmetria 0   Time 1   Total 3/6     FUGL-MEYER ASSESSMENT: UPPER EXTREMITY A-E TOTAL on (date): 32/66   SENSATION: Sensation feels different in both hands.  L hand Light touch- impaired L hand  Proprioception- impaired  EDEMA: Has had hx of edema, and came to Eval session with swelling in the L hand/wrist/forearm.   *pt. has a laceration from his dog on the dorsal aspect of the L hand.   MUSCLE TONE: Increased tone through the UE and hand flexors.   COGNITION: Overall cognitive status: Within functional limits for tasks assessed  VISION: Subjective report: L sided inattention Baseline vision: Wears glasses for reading only Visual history: Right visual occlusion  VISION ASSESSMENT: To be assessed  PERCEPTION: TBD  PRAXIS: Impaired                                                                                                                 TREATMENT DATE: 01/28/2024  Therapeutic Exercise:   -AROM/AAROM/PROM for the LUE including left shoulder flexion, abduction, horizontal abduction, elbow flexion, extension, forearm supination, pronation, wrist extension, digit MP/PIP/and DIP extension to increase ROM, and decrease stiffness  Estim attended:   -Tx time: 10 min, 38mA CC, duty cycle 50%, ramp 5 sec, cycle time 5/5; electrodes placed at LUE proximal and distal forearm, dorsal aspect, to promote wrist and digit extension.    Therapeutic Activities:  -Facilitated left hand [facility] skills grasping 1/2 cubes out of the therapist's hand. Pt. worked on reaching out  with the LUE to release them into a container placed at various angles. -Facilitated left hand [facility] skills grasping 1.5 flat circular discs, and reaching up with the left hand to place them through a thin targeted slot.     Education: Education details:  [facility] skills,  Person educated: Patient and spouse Education method: Explanation, Demonstration, Tactile cues, Verbal cues, and Handouts Education comprehension: verbalized understanding, further training needed  HOME EXERCISE PROGRAM: -grasping and stacking 1, and 1/2 cubes form the tabletop surface; yellow theraband for LUE strengthening   GOALS: Goals  reviewed with patient? Yes  SHORT TERM GOALS: Target date: 02/11/2024   Pt. Will be independent with HEP for the LUE.  Baseline: 12/31/23: Independent; ongoing 12/03/23: indep with current HEP, though ongoing progressions are being made as UE function improves; 10/29/23: Pt. requires assist from wife with HEPs. Pt. Is consistently working on HEP's at home. 09/19/23: Requires assist from his wife Eval; no current HEP Goal status: Achieved,Ongoing  LONG TERM GOALS: Target date: 03/24/2024  Pt. Will increase L shoulder flexion by 10 degrees  to be able to reach up to shelves.  Baseline: 12/31/23: Left: 95 degrees before scaption, 110 of scaption 12/03/23: 90* before onset of scaption; 10/29/23: 111(115) 09/19/23: Pt. Continues to be limited with reaching up to shelves. Eval: L shoulder flexion is 89 (110). Goal status: progressing, Ongoing  2.  Pt. Will increase L shoulder abduction by 10 degrees to assist with underarm hygiene.  Baseline: 12/31/23: Left: 105(118) 12/03/23: 100*; pt able to demo bilat underarm hygiene with set up; 10/29/23: 888(877) 09/19/23: Pt. Continues to require assist with underarm hygiene.Eval: L shoulder abduction is 98 (120).  Goal status: Progressing, ongoing  3.  Pt. Will improve L wrist extension by 10 degrees to be able to initiate anticipation of grasping for objects.  Baseline: 12/31/23: 30(45) 12/03/23: 30* (45); 10/29/23: 20(55) 09/19/23: Consistent activation noted in the left wrist extensors with facilitation. Eval: -48 (42) Goal status: Ongoing  4.  Pt. Will improve active gross digit extension through 50% of the range to be able to release objects in his hand consistently.  Baseline:12/31/23: Gross digit extension: ~30-40% 12/03/23: Gross digit ext through ~30% of range (limited by flexor tone in the PIPs); 10/28/20: Gross digit extension through 25-50% of the range. 09/19/23: gross digit extension noted through 25% range with facilitation.Eval: gross digit extension is 10% of  the range  Goal status: Ongoing  5.  Pt. Will independently demonstrate visual compensatory strategies when navigating through environment and during tabletop tasks. Baseline: 12/03/23: Pt demonstrates consistent head turns to scan to L visual field without cueing; 10/29/2023: Pt. Continues to less cuing for left sided awareness. 09/19/23: Pt. requires fewer cues for left sided awareness Eval: Pt. Requires consistent cuing for L sided awareness.  Goal status: achieved   6. Pt. Will improve left hand [facility] skills to be able to independently manipulate small objects during ADLs, and IADLs. Baseline: 12/31/23: Pt. Is r=progressing to be able to grasp progressively smaller objects. 12/03/23: Pt removed 3 pegs in >3 min, pulling out more than 3 but repeatedly dropping to floor or table top rather than assessment dish; 10/29/23: 9 Hole Peg Test: Pt. Was able to remove 9 pegs from vertical position on the pegboard in 1 min. & 13 sec. Pt. Is not yet able to pick up pegs from a horizontal position to place them in the pegboard.   Goal status: ongoing   7. Pt will improve left hand function skills as evidenced by improved score to 4 blocks on the Box and Blocks test. Baseline: 12/31/23: 4 blocks in 1 min. 12/03/23: 4 blocks after 3 trials and passive wrist and digit stretching between trials (not yet consistent to achieve 4 blocks); 10/29/23: Box and Blocks Test: 2 Blocks completed in 1 min.   Goal Status: ongoing   ASSESSMENT: CLINICAL IMPRESSION:   Upon arrival patient reports that  they are now waiting the next steps in the Vivistim process, and arranging to meet with the surgeon. Pt. was able to grasp the cubes with his left hand and securely hold them while performing forearm supination, and reaching up to discard them into a container placed on an elevated surface. Pt. dropped multiple discs from his hand initially when attempting to press them through the targeted slot, and required the position/angle of the  target to be adjusted along with support proximally for improved accuracy. Pt continues to benefit from OT services to work on improving LUE functioning to increase engagement of the LUE during daily tasks, and to provide education about compensatory strategies  during ADLs/IADLs.   PERFORMANCE DEFICITS: in functional skills including ADLs, IADLs, coordination, dexterity, proprioception, sensation, edema, tone, ROM, strength, pain, Fine motor control, Gross motor control, mobility, balance, endurance, vision, and UE functional use, cognitive skills including perception, and psychosocial skills including coping strategies, environmental adaptation, and routines and behaviors.   IMPAIRMENTS: are limiting patient from ADLs, IADLs, and leisure.   CO-MORBIDITIES: may have co-morbidities  that affects occupational performance. Patient will benefit from skilled OT to address above impairments and improve overall function.  MODIFICATION OR ASSISTANCE TO COMPLETE EVALUATION: Min-Moderate modification of tasks or assist with assess necessary to complete an evaluation.  OT OCCUPATIONAL PROFILE AND HISTORY: Detailed assessment: Review of records and additional review of physical, cognitive, psychosocial history related to current functional performance.  CLINICAL DECISION MAKING: Moderate - several treatment options, min-mod task modification necessary  REHAB POTENTIAL: Good  EVALUATION COMPLEXITY: Moderate  PLAN:  OT FREQUENCY: 2x/week  OT DURATION: 12 weeks  PLANNED INTERVENTIONS: 97168 OT Re-evaluation, 97535 self care/ADL training, 02889 therapeutic exercise, 97530 therapeutic activity, 97112 neuromuscular re-education, 97140 manual therapy, 97018 paraffin, 02989 moist heat, 97010 cryotherapy, 97034 contrast bath, 97760 Orthotic Initial, 97763 Orthotic/Prosthetic subsequent, passive range of motion, visual/perceptual remediation/compensation, energy conservation, and DME and/or AE  instructions  RECOMMENDED OTHER SERVICES: PT and ST  CONSULTED AND AGREED WITH PLAN OF CARE: Patient and family member/caregiver  PLAN FOR NEXT SESSION: see above  Richardson Otter, MS, OTR/L   01/28/2024, 4:25 PM

## 2024-01-30 ENCOUNTER — Encounter: Payer: Self-pay | Admitting: Occupational Therapy

## 2024-01-30 ENCOUNTER — Encounter: Attending: Internal Medicine | Admitting: Internal Medicine

## 2024-01-30 ENCOUNTER — Ambulatory Visit: Admitting: Occupational Therapy

## 2024-01-30 ENCOUNTER — Ambulatory Visit: Admitting: Physical Therapy

## 2024-01-30 DIAGNOSIS — M6281 Muscle weakness (generalized): Secondary | ICD-10-CM

## 2024-01-30 DIAGNOSIS — R262 Difficulty in walking, not elsewhere classified: Secondary | ICD-10-CM

## 2024-01-30 DIAGNOSIS — R278 Other lack of coordination: Secondary | ICD-10-CM

## 2024-01-30 DIAGNOSIS — R269 Unspecified abnormalities of gait and mobility: Secondary | ICD-10-CM

## 2024-01-30 DIAGNOSIS — R2681 Unsteadiness on feet: Secondary | ICD-10-CM

## 2024-01-30 DIAGNOSIS — I69354 Hemiplegia and hemiparesis following cerebral infarction affecting left non-dominant side: Secondary | ICD-10-CM

## 2024-01-30 NOTE — Therapy (Signed)
 Outpatient Occupational Therapy Neuro Treatment Note  Patient Name: [name]. MRN: [id number] DOB:July 08, 1950, 73 y.o., male Today's Date: 01/30/2024  PCP: Alm Rower, MD REFERRING PROVIDER: Hermann Toribio PARAS, PA-C  END OF SESSION:  OT End of Session - 01/30/24 1405     Visit Number 47    Number of Visits 72    Date for Recertification  03/24/24    OT Start Time 1405    OT Stop Time 1445    OT Time Calculation (min) 40 min    Activity Tolerance Patient tolerated treatment well    Behavior During Therapy [facility] for tasks assessed/performed           Past Medical History:  Diagnosis Date   Anemia    Cervical myelopathy (HCC)    Chronic venous insufficiency    [name] (chronic kidney disease), stage III (HCC)    Diabetes mellitus without complication (HCC)    GERD (gastroesophageal reflux disease)    Hypercholesteremia    Hypothyroidism    Kidney stones    about 50 in the past--last in March 4/24   Leg weakness, bilateral    due to Myasthenia Gravis   Myasthenia gravis (HCC)    Spinal stenosis    Vitamin B 12 deficiency    Vitamin B 12 deficiency    Wears hearing aid    bilateral   Past Surgical History:  Procedure Laterality Date   BACK SURGERY  09/2015   CARDIAC CATHETERIZATION     CATARACT EXTRACTION W/ INTRAOCULAR LENS IMPLANT     CATARACT EXTRACTION W/PHACO Right 12/21/2015   Procedure: CATARACT EXTRACTION PHACO AND INTRAOCULAR LENS PLACEMENT (IOC);  Surgeon: Dene Etienne, MD;  Location: [facility];  Service: Ophthalmology;  Laterality: Right;  DIABETIC - insulin  and oral meds   COLONOSCOPY     SHOULDER SURGERY     labrum repair   TONSILLECTOMY     Patient Active Problem List   Diagnosis Date Noted   Acquired left foot drop 01/10/2024   Left spastic hemiplegia (HCC) 01/10/2024   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) 10/11/2023   High blood pressure 08/13/2023   Anemia 08/13/2023    Constipation 08/13/2023   Myasthenia gravis (HCC) 08/13/2023   Acute ischemic right MCA stroke (HCC) 07/19/2023   Lymphedema 12/27/2016   Leg pain 07/15/2016   Chronic venous insufficiency 07/15/2016   Swelling of limb 07/15/2016   Type 2 diabetes mellitus (HCC) 07/15/2016   Hyperlipidemia 07/15/2016   ONSET DATE: 07/13/2023  REFERRING DIAG: Acute ischemic R MCA CVA  THERAPY DIAG: muscle weakness (generalized), other lack of coordination, vision disturbance, hemiplegia and hemiparesis following cerebral infarction affecting left non-dominant side (HCC)  Rationale for Evaluation and Treatment: Rehabilitation  SUBJECTIVE:  SUBJECTIVE STATEMENT:  Pt. reports that he is tired from PT but ready to work on his hand.  Pt accompanied by: [name]  PERTINENT HISTORY: Pt. Was admitted to [facility] on 07/13/2023 after sustaining a CVA while on a trip to the beach. Pt. Was diagnosed with R MCA CVA. Pt. Was admitted to inpatient rehab from 07/19/2023-08/13/2023. Past medical history includes high BP, anemia, and Myasthenia Gravis.   PRECAUTIONS: None  WEIGHT BEARING RESTRICTIONS: No  PAIN: 01/16/24: No pain  FALLS: Has patient fallen in last 6 months? Yes  LIVING ENVIRONMENT: Lives with: lives with their family and lives with their spouse-  Lives in: House/apartment- 2 story home and resides mostly on the 1st floor Stairs: No-  Ramped entrance Has following equipment at home:  Vannie, cane, Steadi, shower chair, handheld shower head, bedside commode  PLOF: Independent and Independent with basic ADLs Independent at home   PATIENT GOALS: Would like to walk 10-20 feet each time. 01/16/24: Update: pt has restarted PT visits and will be continuing to focus on mobility in those sessions.  Pt continues to work towards improving functional use of the LUE during OT visits.   OBJECTIVE:  Note: Objective measures were completed at Evaluation unless otherwise noted.  HAND DOMINANCE:  Right  ADLs: Overall ADLs:  Transfers/ambulation related to ADLs: Eating: Independent, 100% R handed Grooming: independent UB Dressing: Can put on shirt independently LB Dressing: Difficulty with pants and requires assistance getting the pants on his feet, has difficulty putting on shoes and socks Toileting: Tot A for toilet hygiene. Uses Steadi during toilet transfers. Bathing: Requires assist with using the L arm to wash the R arm. Tub Shower transfers: Roll in shower, built in shower chair, grab bars and hand held shower head.  IADLs: Shopping: Total A (Wife typically did the shopping prior to onset)   Light housekeeping: Total A (wife typically performs prior to onset) Meal Prep: Independent with light snack prep Community mobility: No driving, able to get in/out of the car Medication management: Set up assistance from his wife using a pill box (pt. Is responsible for taking medications at the correct time) Financial management: family members check behind him. Handwriting: TBD Hobbies: gardening, wood working and sports- [employer] Work history: Owns a tax business  MOBILITY STATUS: Needs Assist:    POSTURE COMMENTS:   Sitting balance: Good supported sitting balance  FUNCTIONAL OUTCOME MEASURES:  Fugl-Meyer: see below  UPPER EXTREMITY ROM:    Active ROM Right Eval  [facility] Right 09/23/2023 [facility] Left eval Left 09/23/2023 Left 10/29/23 Left 12/02/23 Left 12/31/23  Shoulder flexion   89(110) 109(114) 111(115) 90 before scaption (110) 95 before scaption (110)  Shoulder abduction   98(120) 109(120) 111(122) 100 (110) 105(118)  Shoulder adduction         Shoulder extension         Shoulder internal rotation         Shoulder external rotation         Elbow flexion   120(140) 130(136) 141(145)  146  Elbow extension   -28(-10) -5(0) -18(0)  -3(0)  Wrist flexion   60(60) Rest at 60 50(65) 54(60)    Wrist extension   -48(42) 10(40) 20(50) 30 (45) 30(45)  Wrist ulnar deviation      12(22)    Wrist radial deviation     10(20)    Wrist pronation     (82)90    Wrist supination     64(74) 70 (75)   (Blank rows = not tested)  Eval: L Digit flexion: 2nd:3cm (0cm) 3rd: 0cm (0cm), 4th: 0cm (0cm), 5th: 2cm (0cm)  09/23/23:  L Digit flexion to [facility]: 2nd: 3.5cm (0cm), 3rd: 4 cm(0cm), 4th 3.5cm (0cm), 5th: 2cm (0cm)  Eval:  L Digit extension: Active Gross digit extension through 10% of ROM  09/23/23:  L Digit extension: Active Gross digit extension through 50% of ROM  10/29/23:  L Digit extension: Active Gross digit extension through 25-50% of ROM  12/02/23:  L digit extension: Active gross ext through 30%   12/31/23:  L digit extension:  Active gross ext through ~30-40%   UPPER EXTREMITY MMT:     MMT Right eval Left eval Left 09/23/2023 Left 10/29/23  Left 12/02/23 Left 12/31/23  Shoulder flexion 5 3- 3-/5 3-/5  3-/5  Shoulder abduction 5 3- 3-/5 3-/5  3-5/  Shoulder adduction        Shoulder extension        Shoulder internal rotation        Shoulder external rotation        Middle trapezius        Lower trapezius        Elbow flexion 5 TBD 3+/5 4-/5 4+/5 4+/5  Elbow extension 5 TBD 4-/5 3-/5 4+/5 4+/5  Wrist flexion   2/5 3-/5 3-   Wrist extension  2- 2/5 2+/5 3- 3-/5  Wrist ulnar deviation        Wrist radial deviation        Wrist pronation    3/5 3+   Wrist supination    3-/5 3-   (Blank rows = not tested)  HAND FUNCTION:  Eval:  Grip strength: Right: 94#, Left: 1#,  Lateral Key Pinch strength: Right: 14#, Left: 3#  10/29/23:  Grip strength: Left: 16# Lateral Key Pinch strength: Left: 5#  12/02/23: L: 5 lbs, R: 98 # Lateral pinch: Left: 5 lbs   12/31/23:   Grip strength: Right: 105#, Left: 14#,   COORDINATION: Box and Blocks: Right: 34 blocks completed in 1 min., Left: 0 blocks completed in 1 min.   10/29/23:  Box and Blocks: Right: 34 blocks completed in 1 min., Left: 2 blocks completed in 1 min.  9 Hole Peg Test: Left: To  remove 9 pegs from a vertical position: 1 min. &13 sec.  12/02/23: Box and Blocks: 4 blocks 12/02/23: 4 pegs out in 3 min (multiple pulled and dropped out of the cup)   12/02/23: Box and Blocks Test: 5 blocks in 1 min.  FUGL-MEYER ASSESSMENT   A. Upper Extremity   I. Reflex Activity:   12/12/23   Flexors (biceps) 2   Extensors (triceps) 2   Total 4/4                 II. Volitional Movement within Synergies:   12/12/23   Flexor Synergy:      Shoulder Elevation 2   Shoulder Retraction 2   Shoulder Abduction (at least 90*) 2   External Rotation 1   Elbow Flexion 2   Forearm Supination 1   Extensor Synergy:      Shoulder Adduction/Internal Rotation 2   Elbow Extension 1   Forearm Pronation 2   Total 15/18     III. Movement Combining Synergies:   12/12/23   Hand to Lumbar Spine 1   Shoulder Flexion at 90*, elbow at 0* 0   Pron/Sup w/elbow at 90* and shoulder at 0* 1   Total 2/6     IV. Movement Out of Synergy   12/12/23   Shoulder Abduction to 90*, elbow at 0*, forearm pronated 0   Shoulder Flexion 9)-180*, elbow at 0*, forearm in mid position 0   Pron/sup, elbow at 0* and shoulder between 30-90* of flexion 0   Total 0/6       B. Wrist   12/12/23   Stability of wrist at 15* ext, elbow at 90*, shoulder 0* 2   Repeated flex/ext, elbow at 90*, shoulder 0* 1   Stability of wrist at 15* ext, elbow at 0*, shoulder 30* 0   Flex/ext, elbow 0*, shoulder 30* 0   Circumduction  1   Total 4/10     C. Hand  12/12/23   Mass Flexion 1   Mass Extension 1   Total 2/4       D. Grasp   12/12/23   Hook  0   Thumb Adduction - paper 1   Pincer - pen 0   Cylindrical - cup/can 0   Spherical - tennis ball 1   Total 2/10     E. Coordination/Speed   12/12/23   Tremor 2   Dysmetria 0   Time 1   Total 3/6     FUGL-MEYER ASSESSMENT: UPPER EXTREMITY A-E TOTAL on (date): 32/66   SENSATION: Sensation feels different in both hands.  L hand Light touch- impaired L hand  Proprioception- impaired  EDEMA: Has had hx of edema, and came to Eval session with swelling in the L hand/wrist/forearm.   *pt. has a laceration from his dog on the dorsal aspect of the L hand.   MUSCLE TONE: Increased tone through the UE and hand flexors.   COGNITION: Overall cognitive status: Within functional limits for tasks assessed  VISION: Subjective report: L sided inattention Baseline vision: Wears glasses for reading only Visual history: Right visual occlusion  VISION ASSESSMENT: To be assessed  PERCEPTION: TBD  PRAXIS: Impaired                                                                                                                 TREATMENT DATE: 01/30/2024  Therapeutic Exercise:   -AROM/AAROM/PROM for the LUE including left shoulder flexion, abduction, horizontal abduction, elbow flexion, extension, forearm supination, pronation, wrist extension, digit MP/PIP/and DIP extension to increase ROM, and decrease stiffness -Provided red foam roller for soft tissue mobs on volar forearm and wrist at home, good tolerance of 10 reps, HEP complete 3 set x 10 reps  -Opposition to 2nd and 3rd digits with emphasis on digit extension between reps, 3 sets x 10 reps each -Thumb palmar and radial abduction 2 set x 10 reps each  Estim attended:   -Tx time: 10 min, 38mA CC, duty cycle 50%, ramp 5 sec, cycle time 5/5; electrodes placed at LUE proximal and distal forearm, dorsal aspect, to promote wrist and digit extension.       Education: Education details:  [facility] skills,  Person educated: Patient and spouse Education method: Explanation, Demonstration, Tactile cues, Verbal cues, and Handouts Education comprehension: verbalized understanding, further training needed  HOME EXERCISE PROGRAM: -grasping and stacking 1, and 1/2 cubes form the tabletop surface; yellow theraband for LUE strengthening   GOALS: Goals reviewed with patient? Yes  SHORT TERM GOALS: Target date:  02/11/2024   Pt. Will be independent with HEP for the LUE.  Baseline: 12/31/23: Independent; ongoing 12/03/23: indep with current HEP, though ongoing progressions are being made as UE function improves; 10/29/23: Pt. requires assist from wife with HEPs. Pt. Is consistently working on HEP's at home. 09/19/23: Requires assist from his wife Eval; no current HEP Goal status: Achieved,Ongoing  LONG TERM GOALS: Target date: 03/24/2024  Pt. Will increase L shoulder flexion by 10 degrees  to be able to reach up to shelves.  Baseline: 12/31/23: Left: 95 degrees before scaption, 110 of scaption 12/03/23: 90* before onset of scaption; 10/29/23: 111(115) 09/19/23: Pt. Continues to be limited with reaching up to shelves. Eval: L shoulder flexion is 89 (110). Goal status: progressing, Ongoing  2.  Pt. Will increase L shoulder abduction by 10 degrees to assist with underarm hygiene.  Baseline: 12/31/23: Left: 105(118) 12/03/23: 100*; pt able to demo bilat underarm hygiene with set up; 10/29/23: 888(877) 09/19/23: Pt. Continues to require assist with underarm hygiene.Eval: L shoulder abduction is 98 (120).  Goal status: Progressing, ongoing  3.  Pt. Will improve L wrist extension by 10 degrees to be able to initiate anticipation of grasping for objects.  Baseline: 12/31/23: 30(45) 12/03/23: 30* (45); 10/29/23: 20(55) 09/19/23: Consistent activation noted in the left wrist extensors with facilitation. Eval: -48 (42) Goal status: Ongoing  4.  Pt. Will improve active gross digit extension through 50% of the range to be able to release objects in his hand consistently.  Baseline:12/31/23: Gross digit extension: ~30-40% 12/03/23: Gross digit ext through ~30% of range (limited by flexor tone in the PIPs); 10/28/20: Gross digit extension through 25-50% of the range. 09/19/23: gross digit extension noted through 25% range with facilitation.Eval: gross digit extension is 10% of the range  Goal status: Ongoing  5.  Pt. Will independently  demonstrate visual compensatory strategies when navigating through environment and during tabletop tasks. Baseline: 12/03/23: Pt demonstrates consistent head turns to scan to L visual field without cueing; 10/29/2023: Pt. Continues to less cuing for left sided awareness. 09/19/23: Pt. requires fewer cues for left sided awareness Eval: Pt. Requires consistent cuing for L sided awareness.  Goal status: achieved   6. Pt. Will improve left hand [facility] skills to be able to independently manipulate small objects during ADLs, and IADLs. Baseline: 12/31/23: Pt. Is progressing to be able to grasp progressively smaller objects. 12/03/23: Pt removed 3 pegs in >3 min, pulling out more than 3 but repeatedly dropping to floor or table top rather than assessment dish; 10/29/23: 9 Hole Peg Test: Pt. Was able to remove 9 pegs from vertical position on the pegboard in 1 min. & 13 sec. Pt. Is not yet able to pick up pegs from a horizontal position to place them in the pegboard.   Goal status: ongoing   7. Pt will improve left hand function skills as evidenced by improved score to 4 blocks on the Box and Blocks test. Baseline: 12/31/23: 4 blocks in 1 min. 12/03/23: 4 blocks after 3 trials and passive wrist and digit stretching between trials (not yet consistent to achieve 4 blocks); 10/29/23: Box and Blocks Test: 2 Blocks completed in 1 min.   Goal Status: ongoing   ASSESSMENT: CLINICAL IMPRESSION:   Good tolerance of estim focus on wrist extension. Upgraded HEP, issued red foam roller for soft tissue to volar wrist and forearm. Achieves opposition to 2nd and 3rd digits with cues to avoid over gripping. Pt continues to benefit from OT services to work on improving LUE functioning to increase engagement of the LUE during daily tasks, and to provide education about compensatory strategies during ADLs/IADLs.   PERFORMANCE DEFICITS: in functional skills including ADLs, IADLs, coordination, dexterity, proprioception, sensation, edema,  tone, ROM, strength, pain, Fine motor control, Gross motor control, mobility, balance, endurance, vision, and UE functional use, cognitive skills including perception, and psychosocial skills including coping strategies, environmental adaptation, and routines and behaviors.   IMPAIRMENTS: are limiting  patient from ADLs, IADLs, and leisure.   CO-MORBIDITIES: may have co-morbidities  that affects occupational performance. Patient will benefit from skilled OT to address above impairments and improve overall function.  MODIFICATION OR ASSISTANCE TO COMPLETE EVALUATION: Min-Moderate modification of tasks or assist with assess necessary to complete an evaluation.  OT OCCUPATIONAL PROFILE AND HISTORY: Detailed assessment: Review of records and additional review of physical, cognitive, psychosocial history related to current functional performance.  CLINICAL DECISION MAKING: Moderate - several treatment options, min-mod task modification necessary  REHAB POTENTIAL: Good  EVALUATION COMPLEXITY: Moderate  PLAN:  OT FREQUENCY: 2x/week  OT DURATION: 12 weeks  PLANNED INTERVENTIONS: 97168 OT Re-evaluation, 97535 self care/ADL training, 02889 therapeutic exercise, 97530 therapeutic activity, 97112 neuromuscular re-education, 97140 manual therapy, 97018 paraffin, 02989 moist heat, 97010 cryotherapy, 97034 contrast bath, 97760 Orthotic Initial, 97763 Orthotic/Prosthetic subsequent, passive range of motion, visual/perceptual remediation/compensation, energy conservation, and DME and/or AE instructions  RECOMMENDED OTHER SERVICES: PT and ST  CONSULTED AND AGREED WITH PLAN OF CARE: Patient and family member/caregiver  PLAN FOR NEXT SESSION: see above  Elston Slot, M.S. OTR/L  01/30/24, 2:06 PM  ascom [phone]   01/30/2024, 2:06 PM

## 2024-01-30 NOTE — Therapy (Signed)
 OUTPATIENT PHYSICAL THERAPY TREATMENT  Patient Name: [name]. MRN: [id number] DOB:[birth year], 73 y.o., male Today's Date: 01/30/2024 PCP: Dennise Remak, MD  REFERRING PROVIDER:  Pegge Toribio PARAS, PA-C  END OF SESSION:   PT End of Session - 01/30/24 1407     Visit Number 46    Number of Visits 64    Date for Recertification  03/24/24    Authorization Type U[employer]    Authorization Time Period [facility] auth#; [id number] A for 16 PT vst from 10/09-01/02/2025    Authorization - Visit Number 6    Authorization - Number of Visits 16    Progress Note Due on Visit 50    PT Start Time 1320    PT Stop Time 1403    PT Time Calculation (min) 43 min    Equipment Utilized During Treatment Gait belt    Activity Tolerance Patient tolerated treatment well;No increased pain    Behavior During Therapy WFL for tasks assessed/performed                 Past Medical History:  Diagnosis Date   Anemia    Cervical myelopathy (HCC)    Chronic venous insufficiency    [name] (chronic kidney disease), stage III (HCC)    Diabetes mellitus without complication (HCC)    GERD (gastroesophageal reflux disease)    Hypercholesteremia    Hypothyroidism    Kidney stones    about 50 in the past--last in March 4/24   Leg weakness, bilateral    due to Myasthenia Gravis   Myasthenia gravis (HCC)    Spinal stenosis    Vitamin B 12 deficiency    Vitamin B 12 deficiency    Wears hearing aid    bilateral   Past Surgical History:  Procedure Laterality Date   BACK SURGERY  09/2015   CARDIAC CATHETERIZATION     CATARACT EXTRACTION W/ INTRAOCULAR LENS IMPLANT     CATARACT EXTRACTION W/PHACO Right 12/21/2015   Procedure: CATARACT EXTRACTION PHACO AND INTRAOCULAR LENS PLACEMENT (IOC);  Surgeon: Dene Etienne, MD;  Location: [facility];  Service: Ophthalmology;  Laterality: Right;  DIABETIC - insulin  and oral meds   COLONOSCOPY     SHOULDER SURGERY     labrum repair   TONSILLECTOMY      Patient Active Problem List   Diagnosis Date Noted   Acquired left foot drop 01/10/2024   Left spastic hemiplegia (HCC) 01/10/2024   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) 10/11/2023   High blood pressure 08/13/2023   Anemia 08/13/2023   Constipation 08/13/2023   Myasthenia gravis (HCC) 08/13/2023   Acute ischemic right MCA stroke (HCC) 07/19/2023   Lymphedema 12/27/2016   Leg pain 07/15/2016   Chronic venous insufficiency 07/15/2016   Swelling of limb 07/15/2016   Type 2 diabetes mellitus (HCC) 07/15/2016   Hyperlipidemia 07/15/2016    ONSET DATE: 07/19/23  REFERRING DIAG:  P36.488 (ICD-10-CM) - Cerebral infarction due to unspecified occlusion or stenosis of right middle cerebral artery   THERAPY DIAG:  Muscle weakness (generalized)  Other lack of coordination  Hemiplegia and hemiparesis following cerebral infarction affecting left non-dominant side (HCC)  Difficulty in walking, not elsewhere classified  Abnormality of gait and mobility  Unsteadiness on feet  Rationale for Evaluation and Treatment: Rehabilitation  SUBJECTIVE:  SUBJECTIVE STATEMENT:   Pt states he is doing good today. Pt states he was pretty tired after last session.   Denies pain. Pt stating that he often does his stretching with use of strap to pulling his toe up to stretch the hamstring.    From session on 01/14/2024: Patient states the MD suggested/recommended patient get an AFO for his L LE and patient has an appointment with [facility] the 2nd week of November for an assessment.     PERTINENT HISTORY:  CVA with Left hemiplegia in April 2025. Pt started on a swedish knee cage at CIR due to uncontrolled hyperextension in stance. Pt had not been moving much since  discharging from CIR, as therapists instructed pt to perform transfers only at home due to high fall risk with ambulation. Continues to have significant weakenss in the LUE with difficulty extending fingers as well as numbness and inattention to the L side of body resulting in multiple cuts on Arm and leg with WC mobility in home. Will be attaining custom power WC from [employer]. Has loaner currently.   PAIN:  Are you having pain? No  PRECAUTIONS: Fall, history of wounds on LLE   *Latex allergy   WEIGHT BEARING RESTRICTIONS: No  FALLS: Has patient fallen in last 6 months? Yes. Number of falls 1  LIVING ENVIRONMENT: Lives with: lives with their spouse Lives in: House/apartment Stairs: Ramps installed while in the Hospital  Has following equipment at home: Vannie - 2 wheeled and Wheelchair (power)  PLOF: Independent with basic ADLs, Independent with household mobility without device, and Independent with community mobility with device with [facility]   PATIENT GOALS: relearn to Walk 15-11ft.   OBJECTIVE:                                                                                                                              TREATMENT DATE: 01/30/2024  Pt arrives in transport chair.   Pt agreeable to continue participating in higher intensity gait training using litegait overground to address more balance deficits. Donned L swedish knee cage for gait.  Transfer from transport chair to green chair using bari-RW and light min A of 1 for balance.  Donned litegait harness.   Gait training overground; the following trials completed in litegait harness for safety, but not providing BWS (body weight support), with therapist assist for driving litegait. 1 lap  (down & back) in hallway (~172ft) with B UE support and goal of patient driving the litegait for increased balance challenge Requires intermittent min A to manage litegait Therapist providing verbal cuing throughout for improved L LE foot  placement during swing advancement to improve his balance Pt continuing to improve in motor plan/sequencing of correct stepping strategy; however, pt noted to rely on R UE support to facilitate R wt shift onto R LE Therapist allowing pt to control litegait when turning (intermittent min A), having pt complete turning to R & L side this  date, therapist providing cuing for sequencing LE steps; continuing to decrease verbal cueing  1.5 laps (~250 ft) with R HHA only; therapist driving litegait in order to decrease reliance un UE support Pt with increased difficulty with stepping strategies this date, likely secondary to fatigue; provided additional facilitation at pt's waist for increased weight shift to promote increased foot clearance Verbal cueing for increased weight shift to promote LLE foot clearance 1 lap down length of hallway (~70 ft) with R HHA retro gait Pt with continued posterior lean, making it difficult to step back with LLE, often with step to pattern, but improved from previous session Verbal cueing throughout for increased anterior lean, increased step length with LLE RPE: 17/20 Of note, pt completing above gait training without seated or standing rest break between.   Pt will benefit from continued participation in overground gait training with use of litegait for balance safety to further train stepping strategies to maintain balance without reliance on R UE support.  Doffed litegait harness.   Exited session in transport chair and in the care of his wife for transport to OT appointment.    PATIENT EDUCATION: Education details: knee cage needs assessmnet as it is causeing crossover gait.  Person educated: Patient and Spouse Education method: Explanation, VC/TC/Demo Education comprehension: verbalized understanding  HOME EXERCISE PROGRAM: Access Code: [email or web address] URL: [email or web address] Date: 01/14/2024 Prepared by: Connell Kiss  Exercises - Seated Knee  Extension AROM  - 1 x daily - 4 x weekly - 3 sets - 10 reps - Seated Hip Abduction  - 1 x daily - 4 x weekly - 3 sets - 10 reps - Seated March  - 1 x daily - 4 x weekly - 3 sets - 10 reps - Sit to Stand with Counter Support  - 1 x daily - 4 x weekly - 3 sets - 10 reps - Supine Short Arc Quad  - 1 x daily - 4 x weekly - 3 sets - 10 reps - Supine Hip Abduction  - 1 x daily - 4 x weekly - 3 sets - 10 reps - Supine Bridge  - 1 x daily - 4 x weekly - 3 sets - 3 reps - 2sec  hold - Seated Hamstring Stretch with Strap  - 2-3 x daily - 7 x weekly - 2 sets - 30 second to 1 minute hold - Seated Knee Flexion Slide  - 2-3 x daily - 7 x weekly - 2 sets - 30 seconds to 1 minute hold  12/17/2023: verbal instruction to complete repeated sit<>stands with education on safe set-up provided  GOALS: Goals reviewed with patient? Yes  SHORT TERM GOALS: Target date: 02/11/2024  Patient will be independent in home exercise program to improve strength/mobility for better functional independence with ADLs. Baseline: Initiated on 09/11/2023 10/22/2023: will update when appropriate 11/26/2023: patient participating in transfer and gait practice in controlled environment with 2 person assistance at home in addition to HEP 12/31/2023: patient participating in gait using bari-RW at home with family assistance Goal status: IN PROGRESS  LONG TERM GOALS: Target date: 03/24/2024  Patient will increase SIS-16 score to equal to or greater than 10points to demonstrate statistically significant improvement in mobility and quality of life.  Baseline: 49/80 09/19/2023: 45/80 10/22/2023: 46/80 11/26/2023: 47/80 12/31/2023: 53/80 Goal status: IN PROGRESS  2.  Patient (> 46 years old) will complete five times sit to stand test in < 15 seconds indicating an increased LE strength and improved balance. Baseline: 1:5min 09/19/2023: 56.39  seconds using R UE support to push-up from armrest of green chair and requiring skilled min A for  balance and lifting/lowering  10/22/2023: 27.10 seconds using R UE support to push-up from armrest  8/12: 27.7 sec with RUE pushing from arm rest.  11/26/2023: 37.42 seconds using R UE support to push-up from armrest of green chair and not using L UE support with min-modA for facilitating anterior weight shift and lifting to come to stand secondary to fatigue (would benefit from re-testing at next session) 12/17/2023: 24.59 seconds using R UE support to push-up from armrest of green chair and not using L UE support with CGA/light min A for safety/steadying  Goal status: IN PROGRESS  3.  Patient will increase FIST score by > 6 points to demonstrate decreased fall risk during functional activities Baseline:  43/56  09/23/2023: 53/56 Goal status: MET and UPGRADED  09/23/2023: Patient will increase Berg Balance score to > 45/56 to demonstrate improved balance and decreased fall risk during functional activities and ADLs.  Baseline: 09/23/2023: 14/56 7/31: 20/56 using RW support 11/26/2023: 17/56 without AD support 12/31/2023: 21/56 without AD support Goal status: IN PROGRESS  4.  Patient will increase 10 meter walk test to >0.76m/s as to improve gait speed for better community ambulation and to reduce fall risk. Baseline: 09/09/2023: 0.103 m/s ( and 37 seconds) using bari-RW with skilled heavy min A and +2 w/c follow for safety  09/19/2023: 0.91m/s using bari-RW with skilled min A of 1 and +2 w/c follow for safety 10/22/2023: 0.1875 m/s using bari-RW with L hand splint, L swedish knee cage, and only skilled light min A of 1 (no wheelchair follow)  8/12: 0.162m/s with RW, and L hand splint. No knee cage on this day.  11/26/2023: Average Normal speed: 0.20 m/s & Average Fast speed: 0.275 m/s using bari-RW with CGA/min A for balance wearing [facility] 12/17/2023: Average Normal speed: 0.32 m/s using bari-RW with CGA & Average Fast speed: 0.36 m/s using bari-RW requiring CGA/light min A  12/31/2023: Average Normal  speed: 0.3275 m/s using bari-RW with CGA/light min A for steadying Goal status: IN PROGRESS  5.  Patient will reduce timed up and go to <11 seconds to reduce fall risk and demonstrate improved transfer/gait ability. Baseline: unable to complete turn at 10 ft. Mod-max assist due to poor control of LW on RW, resulting in L lateral LOB  09/09/2023: and 32 seconds using bari-RW with L hand splint and +2 on R side for safety, therapist providing skilled min A on L side (Had pt set-up L hand on RW splint, get L foot in proper positioning, and scoot forward in seat before starting the timer) 09/19/2023: 59min43 seconds using bari-RW with only skilled min A of 1 (Had pt set-up L hand on RW splint, get L foot in proper positioning, and scoot forward in seat before starting the timer) 7/2: 78sec(1:18.125) with RW and min assist overall.  10/22/2023: 1 min, 30 seconds using bari-RW with L hand splint and skilled light min assist  8/12: 1:01 min with RW and L hand splint.  11/26/2023: 51.57 seconds using bari-RW with hand splint, wearing SKC, and CGA-minA for balance 12/31/2023: 43.88 seconds using bari-RW with hand splint, wearing SKC, and CGA/occasional light min A   Goal status: IN PROGRESS   ASSESSMENT:  CLINICAL IMPRESSION:  Patient arrives motivated to participate in therapy session. Patient with appointment for AFO consult with [facility] in November. Therapy session focused on high intensity dynamic gait training  with use of litegait harness in order to allow patient to safely make errors in his steps to promote motor learning of proper stepping balance recovery strategies. Patient requires use of litegait harness to ensure his safety while participating in dynamic gait training activities to allow him to learn how to step L LE appropriately to maintain his balance when he does have a loss of balance. Therapist continuing cuing for proper wt shifting and stepping strategy, pt benefiting from manual  facilitation of weight shift this date. Continued to note patient with increasing reliance on R UE support to facilitate weight shifting onto stance limbs; therefore, transitioned to R HHA from +2 A to promote patient learning how to shift weight at his pelvis during gait for remainder of session with both forward & retro gait. Pt with continuing with posterior lean during retro ambulation; requiring verbal cueing for improved anterior weight shift to allow increased L step length.  Patient will benefit from continued progression of dynamic gait training in litegait harness with R HHA from +2 A to promote improved motor planning and sequencing of LE steps and wt shifting to maintain his balance when turning.  Pt will continue to benefit from continued skilled physical therapy intervention to address impairments, improve QOL, and attain therapy goals.     OBJECTIVE IMPAIRMENTS: Abnormal gait, cardiopulmonary status limiting activity, decreased activity tolerance, decreased balance, decreased cognition, decreased coordination, decreased endurance, decreased knowledge of condition, decreased knowledge of use of DME, decreased mobility, difficulty walking, decreased ROM, decreased strength, decreased safety awareness, dizziness, hypomobility, increased fascial restrictions, impaired perceived functional ability, increased muscle spasms, impaired sensation, impaired tone, impaired UE functional use, impaired vision/preception, improper body mechanics, postural dysfunction, and obesity.   ACTIVITY LIMITATIONS: carrying, lifting, bending, sitting, standing, squatting, stairs, transfers, bed mobility, bathing, toileting, dressing, reach over head, hygiene/grooming, locomotion level, and caring for others  PARTICIPATION LIMITATIONS: meal prep, cleaning, laundry, medication management, interpersonal relationship, driving, shopping, community activity, and yard work  PERSONAL FACTORS: Age, Past/current experiences,  Time since onset of injury/illness/exacerbation, and 3+ comorbidities: MG, HTN, CVA, lymphedema are also affecting patient's functional outcome.   REHAB POTENTIAL: Good  CLINICAL DECISION MAKING: Unstable/unpredictable  EVALUATION COMPLEXITY: High  PLAN:  PT FREQUENCY: 1-2x/week  PT DURATION: 8 weeks  PLANNED INTERVENTIONS: 97164- PT Re-evaluation, 97750- Physical Performance Testing, 97110-Therapeutic exercises, 97530- Therapeutic activity, [id number]- Neuromuscular re-education, 97535- Self Care, [id number]- Manual therapy, [id number]- Gait training, [phone]- Orthotic Initial, [phone]- Orthotic/Prosthetic subsequent, [phone]- Canalith repositioning, [id number]- Electrical stimulation (unattended), [phone]- Electrical stimulation (manual), 97597- Wound care (first 20 sq cm), 97598- Wound care (each additional 20 sq cm), Patient/Family education, Balance training, Stair training, Taping, Dry Needling, Joint mobilization, Joint manipulation, Vestibular training, Visual/preceptual remediation/compensation, Cognitive remediation, DME instructions, Wheelchair mobility training, Cryotherapy, and Moist heat  PLAN FOR NEXT SESSION:   Add STS to HEP HIGT (high intensity gait training) using litegait harness either on treadmill vs overground Continue overground gait training in litegait harness at next visit - decreasing reliance on R UE support Continue stair navigation training Dynamic gait training using bari-RW vs litegait Backwards Side stepping Obstacle navigation (turning) Working on forward propulsion with increased L hip extension and forward progression of pelvis over L stance phase Dynamic standing balance of trunk rotation Continue monitoring SKC fit & see if posterior knee strap needs to be adjusted   Chiquita Silvan, SPT Physical Therapy Student - [facility]  [facility]  6:19 PM 01/30/24

## 2024-02-04 ENCOUNTER — Ambulatory Visit: Admitting: Occupational Therapy

## 2024-02-04 ENCOUNTER — Ambulatory Visit

## 2024-02-04 DIAGNOSIS — M6281 Muscle weakness (generalized): Secondary | ICD-10-CM

## 2024-02-04 DIAGNOSIS — R2681 Unsteadiness on feet: Secondary | ICD-10-CM

## 2024-02-04 DIAGNOSIS — R262 Difficulty in walking, not elsewhere classified: Secondary | ICD-10-CM

## 2024-02-04 DIAGNOSIS — I69354 Hemiplegia and hemiparesis following cerebral infarction affecting left non-dominant side: Secondary | ICD-10-CM

## 2024-02-04 DIAGNOSIS — R41841 Cognitive communication deficit: Secondary | ICD-10-CM

## 2024-02-04 DIAGNOSIS — H539 Unspecified visual disturbance: Secondary | ICD-10-CM

## 2024-02-04 DIAGNOSIS — R278 Other lack of coordination: Secondary | ICD-10-CM

## 2024-02-04 DIAGNOSIS — R269 Unspecified abnormalities of gait and mobility: Secondary | ICD-10-CM

## 2024-02-04 NOTE — Therapy (Signed)
 OUTPATIENT PHYSICAL THERAPY TREATMENT  Patient Name: [name]. MRN: [id number] DOB:1950-05-13, 73 y.o., male Today's Date: 02/04/2024 PCP: Dennise Remak, MD  REFERRING PROVIDER:  Pegge Toribio PARAS, PA-C  END OF SESSION:   PT End of Session - 02/04/24 1440     Visit Number 47    Number of Visits 64    Date for Recertification  03/24/24    Authorization Type U[employer]    Authorization Time Period [facility] auth#; [id number] A for 16 PT vst from 10/09-01/02/2025    Authorization - Number of Visits 16    Progress Note Due on Visit 50    PT Start Time 1445    PT Stop Time 1530    PT Time Calculation (min) 45 min    Equipment Utilized During Treatment Gait belt    Activity Tolerance Patient tolerated treatment well;No increased pain    Behavior During Therapy WFL for tasks assessed/performed          Past Medical History:  Diagnosis Date   Anemia    Cervical myelopathy (HCC)    Chronic venous insufficiency    [name] (chronic kidney disease), stage III (HCC)    Diabetes mellitus without complication (HCC)    GERD (gastroesophageal reflux disease)    Hypercholesteremia    Hypothyroidism    Kidney stones    about 50 in the past--last in March 4/24   Leg weakness, bilateral    due to Myasthenia Gravis   Myasthenia gravis (HCC)    Spinal stenosis    Vitamin B 12 deficiency    Vitamin B 12 deficiency    Wears hearing aid    bilateral   Past Surgical History:  Procedure Laterality Date   BACK SURGERY  09/2015   CARDIAC CATHETERIZATION     CATARACT EXTRACTION W/ INTRAOCULAR LENS IMPLANT     CATARACT EXTRACTION W/PHACO Right 12/21/2015   Procedure: CATARACT EXTRACTION PHACO AND INTRAOCULAR LENS PLACEMENT (IOC);  Surgeon: Dene Etienne, MD;  Location: [facility];  Service: Ophthalmology;  Laterality: Right;  DIABETIC - insulin  and oral meds   COLONOSCOPY     SHOULDER SURGERY     labrum repair   TONSILLECTOMY     Patient Active Problem List   Diagnosis Date  Noted   Acquired left foot drop 01/10/2024   Left spastic hemiplegia (HCC) 01/10/2024   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) 10/11/2023   High blood pressure 08/13/2023   Anemia 08/13/2023   Constipation 08/13/2023   Myasthenia gravis (HCC) 08/13/2023   Acute ischemic right MCA stroke (HCC) 07/19/2023   Lymphedema 12/27/2016   Leg pain 07/15/2016   Chronic venous insufficiency 07/15/2016   Swelling of limb 07/15/2016   Type 2 diabetes mellitus (HCC) 07/15/2016   Hyperlipidemia 07/15/2016    ONSET DATE: 07/19/23  REFERRING DIAG:  P36.488 (ICD-10-CM) - Cerebral infarction due to unspecified occlusion or stenosis of right middle cerebral artery   THERAPY DIAG:  Hemiplegia and hemiparesis following cerebral infarction affecting left non-dominant side (HCC)  Other lack of coordination  Muscle weakness (generalized)  Difficulty in walking, not elsewhere classified  Abnormality of gait and mobility  Unsteadiness on feet  Cognitive communication deficit  Vision disturbance  Rationale for Evaluation and Treatment: Rehabilitation  SUBJECTIVE:  SUBJECTIVE STATEMENT:   Pt reports he was fatigued after the last session and went to bed early and still woke up later in the morning.  Pt reports he has been walking at home as well.  Pt denies any other complaints at this time.   From session on 01/14/2024: Patient states the MD suggested/recommended patient get an AFO for his L LE and patient has an appointment with [facility] the 2nd week of November for an assessment.     PERTINENT HISTORY:  CVA with Left hemiplegia in April 2025. Pt started on a swedish knee cage at CIR due to uncontrolled hyperextension in stance. Pt had not been moving much  since discharging from [facility], as therapists instructed pt to perform transfers only at home due to high fall risk with ambulation. Continues to have significant weakenss in the LUE with difficulty extending fingers as well as numbness and inattention to the L side of body resulting in multiple cuts on Arm and leg with WC mobility in home. Will be attaining custom power WC from [employer]. Has loaner currently.   PAIN:  Are you having pain? No  PRECAUTIONS: Fall, history of wounds on LLE   *Latex allergy   WEIGHT BEARING RESTRICTIONS: No  FALLS: Has patient fallen in last 6 months? Yes. Number of falls 1  LIVING ENVIRONMENT: Lives with: lives with their spouse Lives in: House/apartment Stairs: Ramps installed while in the Hospital  Has following equipment at home: Vannie - 2 wheeled and Wheelchair (power)  PLOF: Independent with basic ADLs, Independent with household mobility without device, and Independent with community mobility with device with [facility]   PATIENT GOALS: relearn to Walk 15-31ft.   OBJECTIVE:                                                                                                                              TREATMENT DATE: 02/04/2024  Pt arrives in transport chair.   Pt agreeable to continue participating in higher intensity gait training using litegait overground to address more balance deficits. Donned L swedish knee cage for gait.  Transfer from transport chair to green chair using bari-RW and light min A of 1 for balance.  Donned litegait harness.   Gait training overground; the following trials completed in litegait harness for safety, but not providing BWS (body weight support), with therapist assist for driving litegait.  Round 1: 1 lap  (down & back) in hallway (~150ft) with B UE support and goal of patient driving the litegait for increased balance challenge  Round 2: 1 lap  (down & back) in hallway (~133ft) with B UE support and goal of patient  driving the litegait for increased balance challenge  Round 3: 1 lap  (down & back) in hallway (~131ft) with B UE support and goal of patient driving the litegait for increased balance challenge  Round 4: 1 lap around the gym with retro gait when in the straightaway portion of  the gym with B UE support and goal of patient driving the litegait for increased balance challenge  Pt has good technique with turns to the L side, but whenever performing the turn to the R, pt tends to cross LE's over each other and become off balanced.  Pt needed the harness to be in place to prevent fall.    Of note, pt completing above gait training without seated or standing rest break between.    Pt will benefit from continued participation in overground gait training with use of litegait for balance safety to further train stepping strategies to maintain balance without reliance on R UE support.  Doffed litegait harness.   Exited session in transport chair and in the care of his wife for transport to OT appointment.    PATIENT EDUCATION: Education details: knee cage needs assessmnet as it is causeing crossover gait.  Person educated: Patient and Spouse Education method: Explanation, VC/TC/Demo Education comprehension: verbalized understanding  HOME EXERCISE PROGRAM: Access Code: [email or web address] URL: [email or web address] Date: 01/14/2024 Prepared by: Connell Kiss  Exercises - Seated Knee Extension AROM  - 1 x daily - 4 x weekly - 3 sets - 10 reps - Seated Hip Abduction  - 1 x daily - 4 x weekly - 3 sets - 10 reps - Seated March  - 1 x daily - 4 x weekly - 3 sets - 10 reps - Sit to Stand with Counter Support  - 1 x daily - 4 x weekly - 3 sets - 10 reps - Supine Short Arc Quad  - 1 x daily - 4 x weekly - 3 sets - 10 reps - Supine Hip Abduction  - 1 x daily - 4 x weekly - 3 sets - 10 reps - Supine Bridge  - 1 x daily - 4 x weekly - 3 sets - 3 reps - 2sec  hold - Seated Hamstring Stretch with  Strap  - 2-3 x daily - 7 x weekly - 2 sets - 30 second to 1 minute hold - Seated Knee Flexion Slide  - 2-3 x daily - 7 x weekly - 2 sets - 30 seconds to 1 minute hold  12/17/2023: verbal instruction to complete repeated sit<>stands with education on safe set-up provided  GOALS: Goals reviewed with patient? Yes  SHORT TERM GOALS: Target date: 02/11/2024  Patient will be independent in home exercise program to improve strength/mobility for better functional independence with ADLs. Baseline: Initiated on 09/11/2023 10/22/2023: will update when appropriate 11/26/2023: patient participating in transfer and gait practice in controlled environment with 2 person assistance at home in addition to HEP 12/31/2023: patient participating in gait using bari-RW at home with family assistance Goal status: IN PROGRESS  LONG TERM GOALS: Target date: 03/24/2024  Patient will increase SIS-16 score to equal to or greater than 10points to demonstrate statistically significant improvement in mobility and quality of life.  Baseline: 49/80 09/19/2023: 45/80 10/22/2023: 46/80 11/26/2023: 47/80 12/31/2023: 53/80 Goal status: IN PROGRESS  2.  Patient (> 38 years old) will complete five times sit to stand test in < 15 seconds indicating an increased LE strength and improved balance. Baseline: 1:51min 09/19/2023: 56.39 seconds using R UE support to push-up from armrest of green chair and requiring skilled min A for balance and lifting/lowering  10/22/2023: 27.10 seconds using R UE support to push-up from armrest  8/12: 27.7 sec with RUE pushing from arm rest.  11/26/2023: 37.42 seconds using R UE support to push-up from armrest of green chair  and not using L UE support with min-modA for facilitating anterior weight shift and lifting to come to stand secondary to fatigue (would benefit from re-testing at next session) 12/17/2023: 24.59 seconds using R UE support to push-up from armrest of green chair and not using L UE support  with CGA/light min A for safety/steadying  Goal status: IN PROGRESS  3.  Patient will increase FIST score by > 6 points to demonstrate decreased fall risk during functional activities Baseline:  43/56  09/23/2023: 53/56 Goal status: MET and UPGRADED  09/23/2023: Patient will increase Berg Balance score to > 45/56 to demonstrate improved balance and decreased fall risk during functional activities and ADLs.  Baseline: 09/23/2023: 14/56 7/31: 20/56 using RW support 11/26/2023: 17/56 without AD support 12/31/2023: 21/56 without AD support Goal status: IN PROGRESS  4.  Patient will increase 10 meter walk test to >0.5m/s as to improve gait speed for better community ambulation and to reduce fall risk. Baseline: 09/09/2023: 0.103 m/s ( and 37 seconds) using bari-RW with skilled heavy min A and +2 w/c follow for safety  09/19/2023: 0.22m/s using bari-RW with skilled min A of 1 and +2 w/c follow for safety 10/22/2023: 0.1875 m/s using bari-RW with L hand splint, L swedish knee cage, and only skilled light min A of 1 (no wheelchair follow)  8/12: 0.146m/s with RW, and L hand splint. No knee cage on this day.  11/26/2023: Average Normal speed: 0.20 m/s & Average Fast speed: 0.275 m/s using bari-RW with CGA/min A for balance wearing [facility] 12/17/2023: Average Normal speed: 0.32 m/s using bari-RW with CGA & Average Fast speed: 0.36 m/s using bari-RW requiring CGA/light min A  12/31/2023: Average Normal speed: 0.3275 m/s using bari-RW with CGA/light min A for steadying Goal status: IN PROGRESS  5.  Patient will reduce timed up and go to <11 seconds to reduce fall risk and demonstrate improved transfer/gait ability. Baseline: unable to complete turn at 10 ft. Mod-max assist due to poor control of LW on RW, resulting in L lateral LOB  09/09/2023: and 32 seconds using bari-RW with L hand splint and +2 on R side for safety, therapist providing skilled min A on L side (Had pt set-up L hand on RW splint, get L foot  in proper positioning, and scoot forward in seat before starting the timer) 09/19/2023: 29min43 seconds using bari-RW with only skilled min A of 1 (Had pt set-up L hand on RW splint, get L foot in proper positioning, and scoot forward in seat before starting the timer) 7/2: 78sec(1:18.125) with RW and min assist overall.  10/22/2023: 1 min, 30 seconds using bari-RW with L hand splint and skilled light min assist  8/12: 1:01 min with RW and L hand splint.  11/26/2023: 51.57 seconds using bari-RW with hand splint, wearing SKC, and CGA-minA for balance 12/31/2023: 43.88 seconds using bari-RW with hand splint, wearing SKC, and CGA/occasional light min A   Goal status: IN PROGRESS   ASSESSMENT:  CLINICAL IMPRESSION:   Pt continues to respond well to the gait training approach and self-reports feeling more confident with walking thanks to the [employer] training.  Pt noted a BORG rating of 13 during the training session.  Pt will continue to improve upon turns as those still seem to be an area of concern for the pt at this time, mores specifically to the R.   Pt will continue to benefit from skilled therapy to address remaining deficits in order to improve overall QoL and return  to PLOF.       OBJECTIVE IMPAIRMENTS: Abnormal gait, cardiopulmonary status limiting activity, decreased activity tolerance, decreased balance, decreased cognition, decreased coordination, decreased endurance, decreased knowledge of condition, decreased knowledge of use of DME, decreased mobility, difficulty walking, decreased ROM, decreased strength, decreased safety awareness, dizziness, hypomobility, increased fascial restrictions, impaired perceived functional ability, increased muscle spasms, impaired sensation, impaired tone, impaired UE functional use, impaired vision/preception, improper body mechanics, postural dysfunction, and obesity.   ACTIVITY LIMITATIONS: carrying, lifting, bending, sitting, standing, squatting, stairs,  transfers, bed mobility, bathing, toileting, dressing, reach over head, hygiene/grooming, locomotion level, and caring for others  PARTICIPATION LIMITATIONS: meal prep, cleaning, laundry, medication management, interpersonal relationship, driving, shopping, community activity, and yard work  PERSONAL FACTORS: Age, Past/current experiences, Time since onset of injury/illness/exacerbation, and 3+ comorbidities: MG, HTN, CVA, lymphedema are also affecting patient's functional outcome.   REHAB POTENTIAL: Good  CLINICAL DECISION MAKING: Unstable/unpredictable  EVALUATION COMPLEXITY: High  PLAN:  PT FREQUENCY: 1-2x/week  PT DURATION: 8 weeks  PLANNED INTERVENTIONS: 97164- PT Re-evaluation, 97750- Physical Performance Testing, 97110-Therapeutic exercises, 97530- Therapeutic activity, [id number]- Neuromuscular re-education, 97535- Self Care, [id number]- Manual therapy, [id number]- Gait training, [phone]- Orthotic Initial, [phone]- Orthotic/Prosthetic subsequent, [phone]- Canalith repositioning, [id number]- Electrical stimulation (unattended), [phone]- Electrical stimulation (manual), [id number]- Wound care (first 20 sq cm), 97598- Wound care (each additional 20 sq cm), Patient/Family education, Balance training, Stair training, Taping, Dry Needling, Joint mobilization, Joint manipulation, Vestibular training, Visual/preceptual remediation/compensation, Cognitive remediation, DME instructions, Wheelchair mobility training, Cryotherapy, and Moist heat  PLAN FOR NEXT SESSION:    Add STS to HEP HIGT (high intensity gait training) using litegait harness either on treadmill vs overground Continue overground gait training in litegait harness at next visit - decreasing reliance on R UE support Continue stair navigation training Dynamic gait training using bari-RW vs litegait Backwards Side stepping Obstacle navigation (turning) Working on forward propulsion with increased L hip extension and forward progression of pelvis over L stance  phase Dynamic standing balance of trunk rotation Continue monitoring SKC fit & see if posterior knee strap needs to be adjusted    Fonda Simpers, PT, DPT Physical Therapist - [facility]  02/04/24, 5:43 PM

## 2024-02-05 NOTE — Therapy (Addendum)
 Outpatient Occupational Therapy Neuro Treatment Note  Patient Name: [name]. MRN: [id number] DOB:Jun 09, 1950, 73 y.o., male Today's Date: 02/05/2024  PCP: Alm Rower, MD REFERRING PROVIDER: Hermann Toribio PARAS, PA-C  END OF SESSION:  OT End of Session - 02/05/24 0754     Visit Number 48    Number of Visits 72    Date for Recertification  03/24/24    OT Start Time 1400    OT Stop Time 1445    OT Time Calculation (min) 45 min    Activity Tolerance Patient tolerated treatment well    Behavior During Therapy [facility] for tasks assessed/performed           Past Medical History:  Diagnosis Date   Anemia    Cervical myelopathy (HCC)    Chronic venous insufficiency    [name] (chronic kidney disease), stage III (HCC)    Diabetes mellitus without complication (HCC)    GERD (gastroesophageal reflux disease)    Hypercholesteremia    Hypothyroidism    Kidney stones    about 50 in the past--last in March 4/24   Leg weakness, bilateral    due to Myasthenia Gravis   Myasthenia gravis (HCC)    Spinal stenosis    Vitamin B 12 deficiency    Vitamin B 12 deficiency    Wears hearing aid    bilateral   Past Surgical History:  Procedure Laterality Date   BACK SURGERY  09/2015   CARDIAC CATHETERIZATION     CATARACT EXTRACTION W/ INTRAOCULAR LENS IMPLANT     CATARACT EXTRACTION W/PHACO Right 12/21/2015   Procedure: CATARACT EXTRACTION PHACO AND INTRAOCULAR LENS PLACEMENT (IOC);  Surgeon: Dene Etienne, MD;  Location: [facility];  Service: Ophthalmology;  Laterality: Right;  DIABETIC - insulin  and oral meds   COLONOSCOPY     SHOULDER SURGERY     labrum repair   TONSILLECTOMY     Patient Active Problem List   Diagnosis Date Noted   Acquired left foot drop 01/10/2024   Left spastic hemiplegia (HCC) 01/10/2024   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) 10/11/2023   High blood pressure 08/13/2023   Anemia 08/13/2023    Constipation 08/13/2023   Myasthenia gravis (HCC) 08/13/2023   Acute ischemic right MCA stroke (HCC) 07/19/2023   Lymphedema 12/27/2016   Leg pain 07/15/2016   Chronic venous insufficiency 07/15/2016   Swelling of limb 07/15/2016   Type 2 diabetes mellitus (HCC) 07/15/2016   Hyperlipidemia 07/15/2016   ONSET DATE: 07/13/2023  REFERRING DIAG: Acute ischemic R MCA CVA  THERAPY DIAG: muscle weakness (generalized), other lack of coordination, vision disturbance, hemiplegia and hemiparesis following cerebral infarction affecting left non-dominant side (HCC)  Rationale for Evaluation and Treatment: Rehabilitation  SUBJECTIVE:  SUBJECTIVE STATEMENT:  Pt. reports no pain today. Pt accompanied by: [name]  PERTINENT HISTORY: Pt. Was admitted to [facility] on 07/13/2023 after sustaining a CVA while on a trip to the beach. Pt. Was diagnosed with R MCA CVA. Pt. Was admitted to inpatient rehab from 07/19/2023-08/13/2023. Past medical history includes high BP, anemia, and Myasthenia Gravis.   PRECAUTIONS: None  WEIGHT BEARING RESTRICTIONS: No  PAIN: 02/04/24: No pain  FALLS: Has patient fallen in last 6 months? Yes  LIVING ENVIRONMENT: Lives with: lives with their family and lives with their spouse-  Lives in: House/apartment- 2 story home and resides mostly on the 1st floor Stairs: No- Ramped entrance Has following equipment at home:  Tus[place], cane, [place],  shower chair, handheld shower head, bedside commode  PLOF: Independent and Independent with basic ADLs Independent at home   PATIENT GOALS: Would like to walk 10-20 feet each time. 01/16/24: Update: pt has restarted PT visits and will be continuing to focus on mobility in those sessions.  Pt continues to work towards improving functional use of the LUE during OT visits.   OBJECTIVE:  Note: Objective measures were completed at Evaluation unless otherwise noted.  HAND DOMINANCE: Right  ADLs: Overall ADLs:  Transfers/ambulation  related to ADLs: Eating: Independent, 100% R handed Grooming: independent UB Dressing: Can put on shirt independently LB Dressing: Difficulty with pants and requires assistance getting the pants on his feet, has difficulty putting on shoes and socks Toileting: Tot A for toilet hygiene. Uses [place] during toilet transfers. Bathing: Requires assist with using the L arm to wash the R arm. Tub Shower transfers: Roll in shower, built in shower chair, grab bars and hand held shower head.  IADLs: Shopping: Total A (Wife typically did the shopping prior to onset)   Light housekeeping: Total A (wife typically performs prior to onset) Meal Prep: Independent with light snack prep Community mobility: No driving, able to get in/out of the car Medication management: Set up assistance from his wife using a pill box (pt. Is responsible for taking medications at the correct time) Financial management: family members check behind him. Handwriting: TBD Hobbies: gardening, wood working and sports- [employer] Work history: Owns a tax business  MOBILITY STATUS: Needs Assist:    POSTURE COMMENTS:   Sitting balance: Good supported sitting balance  FUNCTIONAL OUTCOME MEASURES:  Fugl-Meyer: see below  UPPER EXTREMITY ROM:    Active ROM Right Eval  [facility] Right 09/23/2023 [facility] Left eval Left 09/23/2023 Left 10/29/23 Left 12/02/23 Left 12/31/23  Shoulder flexion   89(110) 109(114) 111(115) 90 before scaption (110) 95 before scaption (110)  Shoulder abduction   98(120) 109(120) 111(122) 100 (110) 105(118)  Shoulder adduction         Shoulder extension         Shoulder internal rotation         Shoulder external rotation         Elbow flexion   120(140) 130(136) 141(145)  146  Elbow extension   -28(-10) -5(0) -18(0)  -3(0)  Wrist flexion   60(60) Rest at 60 50(65) 54(60)    Wrist extension   -48(42) 10(40) 20(50) 30 (45) 30(45)  Wrist ulnar deviation     12(22)    Wrist radial deviation     10(20)    Wrist  pronation     (82)90    Wrist supination     64(74) 70 (75)   (Blank rows = not tested)  Eval: L Digit flexion: 2nd:3cm (0cm) 3rd: 0cm (0cm), 4th: 0cm (0cm), 5th: 2cm (0cm)  09/23/23:  L Digit flexion to [facility]: 2nd: 3.5cm (0cm), 3rd: 4 cm(0cm), 4th 3.5cm (0cm), 5th: 2cm (0cm)  Eval:  L Digit extension: Active Gross digit extension through 10% of ROM  09/23/23:  L Digit extension: Active Gross digit extension through 50% of ROM  10/29/23:  L Digit extension: Active Gross digit extension through 25-50% of ROM  12/02/23:  L digit extension: Active gross ext through 30%   12/31/23:  L digit extension:  Active gross ext through ~30-40%   UPPER EXTREMITY MMT:     MMT Right eval Left eval Left 09/23/2023 Left 10/29/23 Left 12/02/23 Left 12/31/23  Shoulder flexion 5 3- 3-/5 3-/5  3-/5  Shoulder abduction 5 3- 3-/5 3-/5  3-5/  Shoulder adduction        Shoulder extension        Shoulder internal rotation        Shoulder external rotation        Middle trapezius        Lower trapezius        Elbow flexion 5 TBD 3+/5 4-/5 4+/5 4+/5  Elbow extension 5 TBD 4-/5 3-/5 4+/5 4+/5  Wrist flexion   2/5 3-/5 3-   Wrist extension  2- 2/5 2+/5 3- 3-/5  Wrist ulnar deviation        Wrist radial deviation        Wrist pronation    3/5 3+   Wrist supination    3-/5 3-   (Blank rows = not tested)  HAND FUNCTION:  Eval:  Grip strength: Right: 94#, Left: 1#,  Lateral Key Pinch strength: Right: 14#, Left: 3#  10/29/23:  Grip strength: Left: 16# Lateral Key Pinch strength: Left: 5#  12/02/23: L: 5 lbs, R: 98 # Lateral pinch: Left: 5 lbs   12/31/23:   Grip strength: Right: 105#, Left: 14#,   COORDINATION: Box and Blocks: Right: 34 blocks completed in 1 min., Left: 0 blocks completed in 1 min.   10/29/23:  Box and Blocks: Right: 34 blocks completed in 1 min., Left: 2 blocks completed in 1 min.  9 Hole Peg Test: Left: To remove 9 pegs from a vertical position: 1 min. &13  sec.  12/02/23: Box and Blocks: 4 blocks 12/02/23: 4 pegs out in 3 min (multiple pulled and dropped out of the cup)   12/02/23: Box and Blocks Test: 5 blocks in 1 min.  FUGL-MEYER ASSESSMENT   A. Upper Extremity   I. Reflex Activity:   12/12/23   Flexors (biceps) 2   Extensors (triceps) 2   Total 4/4                 II. Volitional Movement within Synergies:   12/12/23   Flexor Synergy:      Shoulder Elevation 2   Shoulder Retraction 2   Shoulder Abduction (at least 90*) 2   External Rotation 1   Elbow Flexion 2   Forearm Supination 1   Extensor Synergy:      Shoulder Adduction/Internal Rotation 2   Elbow Extension 1   Forearm Pronation 2   Total 15/18     III. Movement Combining Synergies:   12/12/23   Hand to Lumbar Spine 1   Shoulder Flexion at 90*, elbow at 0* 0   Pron/Sup w/elbow at 90* and shoulder at 0* 1   Total 2/6     IV. Movement Out of Synergy   12/12/23   Shoulder Abduction to 90*, elbow at 0*, forearm pronated 0   Shoulder Flexion 9)-180*, elbow at 0*, forearm in mid position 0   Pron/sup, elbow at 0* and shoulder between 30-90* of flexion 0   Total 0/6       B. Wrist   12/12/23   Stability of wrist at 15* ext, elbow at 90*, shoulder 0* 2   Repeated flex/ext, elbow at 90*, shoulder 0* 1   Stability of wrist at 15* ext, elbow at 0*, shoulder 30* 0   Flex/ext, elbow 0*, shoulder 30* 0   Circumduction  1   Total 4/10     C. Hand   12/12/23   Mass Flexion 1   Mass Extension  1   Total 2/4       D. Grasp   12/12/23   Hook  0   Thumb Adduction - paper 1   Pincer - pen 0   Cylindrical - cup/can 0   Spherical - tennis ball 1   Total 2/10     E. Coordination/Speed   12/12/23   Tremor 2   Dysmetria 0   Time 1   Total 3/6     FUGL-MEYER ASSESSMENT: UPPER EXTREMITY A-E TOTAL on (date): 32/66   SENSATION: Sensation feels different in both hands.  L hand Light touch- impaired L hand Proprioception- impaired  EDEMA: Has had hx of edema, and came  to Eval session with swelling in the L hand/wrist/forearm.   *pt. has a laceration from his dog on the dorsal aspect of the L hand.   MUSCLE TONE: Increased tone through the UE and hand flexors.   COGNITION: Overall cognitive status: Within functional limits for tasks assessed  VISION: Subjective report: L sided inattention Baseline vision: Wears glasses for reading only Visual history: Right visual occlusion  VISION ASSESSMENT: To be assessed  PERCEPTION: TBD  PRAXIS: Impaired                                                                                                                 TREATMENT DATE: 02/04/2024  Manual Therapy:  -Pt. tolerated soft tissue massage to the shoulder, and scapular and scapular musculature 2/2 stiffness -Manual therapy was performed independent of, and in preparation for Therapeutic Ex.    Therapeutic Exercise:   -AROM/AAROM/PROM for the LUE including left shoulder flexion, abduction, horizontal abduction, elbow flexion, extension, forearm supination, pronation, wrist extension, digit MP/PIP/and DIP extension to increase ROM, and decrease stiffness   Estim attended:   -Tx time: 8 min, 38mA CC, duty cycle 50%, ramp 5 sec, cycle time 5/5; electrodes placed at LUE proximal and distal forearm, dorsal aspect, to promote wrist and digit extension.     Therapeutic Activities:  -Facilitated diagonal patterns of movement with the LUE in  preparation for self-grooming tasks. -Facilitated bilateral hand [facility] skills. Pt. worked on stabilizing the base of a 1/2 wide bottle with the right hand, while using the left hand to srew, and unscrew the lid. The task was graded from 1/2 to 1 bottles in combination with left forearm supination, and wrist extension.   Education: Education details:  [facility] skills,  Person educated: Patient and spouse Education method: Explanation, Demonstration, Tactile cues, Verbal cues, and Handouts Education comprehension:  verbalized understanding, further training needed  HOME EXERCISE PROGRAM: -grasping and stacking 1, and 1/2 cubes form the tabletop surface; yellow theraband for LUE strengthening   GOALS: Goals reviewed with patient? Yes  SHORT TERM GOALS: Target date: 02/11/2024   Pt. Will be independent with HEP for the LUE.  Baseline: 12/31/23: Independent; ongoing 12/03/23: indep with current HEP, though ongoing progressions are being made as UE function improves; 10/29/23: Pt. requires assist from wife with HEPs. Pt. Is consistently working on [employer]  at home. 09/19/23: Requires assist from his wife Eval; no current HEP Goal status: Achieved,Ongoing  LONG TERM GOALS: Target date: 03/24/2024  Pt. Will increase L shoulder flexion by 10 degrees to be able to reach up to shelves.  Baseline: 12/31/23: Left: 95 degrees before scaption, 110 of scaption 12/03/23: 90* before onset of scaption; 10/29/23: 111(115) 09/19/23: Pt. Continues to be limited with reaching up to shelves. Eval: L shoulder flexion is 89 (110). Goal status: progressing, Ongoing  2.  Pt. Will increase L shoulder abduction by 10 degrees to assist with underarm hygiene.  Baseline: 12/31/23: Left: 105(118) 12/03/23: 100*; pt able to demo bilat underarm hygiene with set up; 10/29/23: 888(877) 09/19/23: Pt. Continues to require assist with underarm hygiene.Eval: L shoulder abduction is 98 (120).  Goal status: Progressing, ongoing  3.  Pt. Will improve L wrist extension by 10 degrees to be able to initiate anticipation of grasping for objects.  Baseline: 12/31/23: 30(45) 12/03/23: 30* (45); 10/29/23: 20(55) 09/19/23: Consistent activation noted in the left wrist extensors with facilitation. Eval: -48 (42) Goal status: Ongoing  4.  Pt. Will improve active gross digit extension through 50% of the range to be able to release objects in his hand consistently.  Baseline:12/31/23: Gross digit extension: ~30-40% 12/03/23: Gross digit ext through ~30% of range (limited  by flexor tone in the PIPs); 10/28/20: Gross digit extension through 25-50% of the range. 09/19/23: gross digit extension noted through 25% range with facilitation.Eval: gross digit extension is 10% of the range  Goal status: Ongoing  5.  Pt. Will independently demonstrate visual compensatory strategies when navigating through environment and during tabletop tasks. Baseline: 12/03/23: Pt demonstrates consistent head turns to scan to L visual field without cueing; 10/29/2023: Pt. Continues to less cuing for left sided awareness. 09/19/23: Pt. requires fewer cues for left sided awareness Eval: Pt. Requires consistent cuing for L sided awareness.  Goal status: achieved   6. Pt. Will improve left hand [facility] skills to be able to independently manipulate small objects during ADLs, and IADLs. Baseline: 12/31/23: Pt. Is progressing to be able to grasp progressively smaller objects. 12/03/23: Pt removed 3 pegs in >3 min, pulling out more than 3 but repeatedly dropping to floor or table top rather than assessment dish; 10/29/23: 9 Hole Peg Test: Pt. Was able to remove 9 pegs from vertical position on the pegboard in 1 min. & 13 sec. Pt. Is not yet able to pick up pegs from a horizontal position to place them in the pegboard.   Goal status: ongoing   7. Pt will improve left hand function skills as evidenced by improved score to 4 blocks on the Box and Blocks test. Baseline: 12/31/23: 4 blocks in 1 min. 12/03/23: 4 blocks after 3 trials and passive wrist and digit stretching between trials (not yet consistent to achieve 4 blocks); 10/29/23: Box and Blocks Test: 2 Blocks completed in 1 min.   Goal Status: ongoing   ASSESSMENT:  CLINICAL IMPRESSION:   Pt. requires hand over hand assist for all hand to face patterns of movement for self-grooming tasks. Pt. continues to tolerate EStim well to the left wrist, and digit extensors.  Pt. continues to present with difficulty manipulating the small 1/2 lids with the left hand. Pt.  continues to benefit from OT services to work on improving LUE functioning to increase engagement of the LUE during daily tasks, and to provide education about compensatory strategies during ADLs/IADLs.   PERFORMANCE DEFICITS: in functional skills including ADLs, IADLs,  coordination, dexterity, proprioception, sensation, edema, tone, ROM, strength, pain, Fine motor control, Gross motor control, mobility, balance, endurance, vision, and UE functional use, cognitive skills including perception, and psychosocial skills including coping strategies, environmental adaptation, and routines and behaviors.   IMPAIRMENTS: are limiting patient from ADLs, IADLs, and leisure.   CO-MORBIDITIES: may have co-morbidities  that affects occupational performance. Patient will benefit from skilled OT to address above impairments and improve overall function.  MODIFICATION OR ASSISTANCE TO COMPLETE EVALUATION: Min-Moderate modification of tasks or assist with assess necessary to complete an evaluation.  OT OCCUPATIONAL PROFILE AND HISTORY: Detailed assessment: Review of records and additional review of physical, cognitive, psychosocial history related to current functional performance.  CLINICAL DECISION MAKING: Moderate - several treatment options, min-mod task modification necessary  REHAB POTENTIAL: Good  EVALUATION COMPLEXITY: Moderate  PLAN:  OT FREQUENCY: 2x/week  OT DURATION: 12 weeks  PLANNED INTERVENTIONS: 97168 OT Re-evaluation, 97535 self care/ADL training, 02889 therapeutic exercise, 97530 therapeutic activity, 97112 neuromuscular re-education, 97140 manual therapy, 97018 paraffin, 02989 moist heat, 97010 cryotherapy, 97034 contrast bath, 97760 Orthotic Initial, 97763 Orthotic/Prosthetic subsequent, passive range of motion, visual/perceptual remediation/compensation, energy conservation, and DME and/or AE instructions  RECOMMENDED OTHER SERVICES: PT and ST  CONSULTED AND AGREED WITH PLAN OF CARE:  Patient and family member/caregiver  PLAN FOR NEXT SESSION: see above  Richardson Otter, MS, OTR/L   02/05/24, 7:56 AM

## 2024-02-06 ENCOUNTER — Ambulatory Visit: Admitting: Occupational Therapy

## 2024-02-06 ENCOUNTER — Ambulatory Visit

## 2024-02-06 ENCOUNTER — Encounter: Admitting: Physician Assistant

## 2024-02-06 DIAGNOSIS — M6281 Muscle weakness (generalized): Secondary | ICD-10-CM | POA: Diagnosis not present

## 2024-02-06 DIAGNOSIS — R269 Unspecified abnormalities of gait and mobility: Secondary | ICD-10-CM

## 2024-02-06 DIAGNOSIS — R278 Other lack of coordination: Secondary | ICD-10-CM

## 2024-02-06 DIAGNOSIS — I69354 Hemiplegia and hemiparesis following cerebral infarction affecting left non-dominant side: Secondary | ICD-10-CM

## 2024-02-06 DIAGNOSIS — H539 Unspecified visual disturbance: Secondary | ICD-10-CM

## 2024-02-06 DIAGNOSIS — R262 Difficulty in walking, not elsewhere classified: Secondary | ICD-10-CM

## 2024-02-06 DIAGNOSIS — R2681 Unsteadiness on feet: Secondary | ICD-10-CM

## 2024-02-06 DIAGNOSIS — R41841 Cognitive communication deficit: Secondary | ICD-10-CM

## 2024-02-06 NOTE — Therapy (Addendum)
 Outpatient Occupational Therapy Neuro Treatment Note  Patient Name: [name]. MRN: [id number] DOB:May 31, 1950, 73 y.o., male Today's Date: 02/06/2024  PCP: Alm Rower, MD REFERRING PROVIDER: Hermann Toribio PARAS, PA-C  END OF SESSION:  OT End of Session - 02/06/24 1812     Visit Number 49    Number of Visits 72    Date for Recertification  03/24/24    OT Start Time 1400    OT Stop Time 1445    OT Time Calculation (min) 45 min    Activity Tolerance Patient tolerated treatment well    Behavior During Therapy WFL for tasks assessed/performed           Past Medical History:  Diagnosis Date   Anemia    Cervical myelopathy (HCC)    Chronic venous insufficiency    [name] (chronic kidney disease), stage III (HCC)    Diabetes mellitus without complication (HCC)    GERD (gastroesophageal reflux disease)    Hypercholesteremia    Hypothyroidism    Kidney stones    about 50 in the past--last in March 4/24   Leg weakness, bilateral    due to Myasthenia Gravis   Myasthenia gravis (HCC)    Spinal stenosis    Vitamin B 12 deficiency    Vitamin B 12 deficiency    Wears hearing aid    bilateral   Past Surgical History:  Procedure Laterality Date   BACK SURGERY  09/2015   CARDIAC CATHETERIZATION     CATARACT EXTRACTION W/ INTRAOCULAR LENS IMPLANT     CATARACT EXTRACTION W/PHACO Right 12/21/2015   Procedure: CATARACT EXTRACTION PHACO AND INTRAOCULAR LENS PLACEMENT (IOC);  Surgeon: Dene Etienne, MD;  Location: [facility];  Service: Ophthalmology;  Laterality: Right;  DIABETIC - insulin  and oral meds   COLONOSCOPY     SHOULDER SURGERY     labrum repair   TONSILLECTOMY     Patient Active Problem List   Diagnosis Date Noted   Acquired left foot drop 01/10/2024   Left spastic hemiplegia (HCC) 01/10/2024   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) 10/11/2023   High blood pressure 08/13/2023   Anemia 08/13/2023    Constipation 08/13/2023   Myasthenia gravis (HCC) 08/13/2023   Acute ischemic right MCA stroke (HCC) 07/19/2023   Lymphedema 12/27/2016   Leg pain 07/15/2016   Chronic venous insufficiency 07/15/2016   Swelling of limb 07/15/2016   Type 2 diabetes mellitus (HCC) 07/15/2016   Hyperlipidemia 07/15/2016   ONSET DATE: 07/13/2023  REFERRING DIAG: Acute ischemic R MCA CVA  THERAPY DIAG: muscle weakness (generalized), other lack of coordination, vision disturbance, hemiplegia and hemiparesis following cerebral infarction affecting left non-dominant side (HCC)  Rationale for Evaluation and Treatment: Rehabilitation  SUBJECTIVE:  SUBJECTIVE STATEMENT:  Pt. reports no pain today. Pt accompanied by: [name]  PERTINENT HISTORY: Pt. Was admitted to [facility] on 07/13/2023 after sustaining a CVA while on a trip to the beach. Pt. Was diagnosed with R MCA CVA. Pt. Was admitted to inpatient rehab from 07/19/2023-08/13/2023. Past medical history includes high BP, anemia, and Myasthenia Gravis.   PRECAUTIONS: None  WEIGHT BEARING RESTRICTIONS: No  PAIN: 02/06/24: No pain  FALLS: Has patient fallen in last 6 months? Yes  LIVING ENVIRONMENT: Lives with: lives with their family and lives with their spouse-  Lives in: House/apartment- 2 story home and resides mostly on the 1st floor Stairs: No- Ramped entrance Has following equipment at home:  [place], cane, [place],  shower chair, handheld shower head, bedside commode  PLOF: Independent and Independent with basic ADLs Independent at home   PATIENT GOALS: Would like to walk 10-20 feet each time. 01/16/24: Update: pt has restarted PT visits and will be continuing to focus on mobility in those sessions.  Pt continues to work towards improving functional use of the LUE during OT visits.   OBJECTIVE:  Note: Objective measures were completed at Evaluation unless otherwise noted.  HAND DOMINANCE: Right  ADLs: Overall ADLs:  Transfers/ambulation  related to ADLs: Eating: Independent, 100% R handed Grooming: independent UB Dressing: Can put on shirt independently LB Dressing: Difficulty with pants and requires assistance getting the pants on his feet, has difficulty putting on shoes and socks Toileting: Tot A for toilet hygiene. Uses [place] during toilet transfers. Bathing: Requires assist with using the L arm to wash the R arm. Tub Shower transfers: Roll in shower, built in shower chair, grab bars and hand held shower head.  IADLs: Shopping: Total A (Wife typically did the shopping prior to onset)   Light housekeeping: Total A (wife typically performs prior to onset) Meal Prep: Independent with light snack prep Community mobility: No driving, able to get in/out of the car Medication management: Set up assistance from his wife using a pill box (pt. Is responsible for taking medications at the correct time) Financial management: family members check behind him. Handwriting: TBD Hobbies: gardening, wood working and sports- [employer] Work history: Owns a tax business  MOBILITY STATUS: Needs Assist:    POSTURE COMMENTS:   Sitting balance: Good supported sitting balance  FUNCTIONAL OUTCOME MEASURES:  Fugl-Meyer: see below  UPPER EXTREMITY ROM:    Active ROM Right Eval  [facility] Right 09/23/2023 [facility] Left eval Left 09/23/2023 Left 10/29/23 Left 12/02/23 Left 12/31/23  Shoulder flexion   89(110) 109(114) 111(115) 90 before scaption (110) 95 before scaption (110)  Shoulder abduction   98(120) 109(120) 111(122) 100 (110) 105(118)  Shoulder adduction         Shoulder extension         Shoulder internal rotation         Shoulder external rotation         Elbow flexion   120(140) 130(136) 141(145)  146  Elbow extension   -28(-10) -5(0) -18(0)  -3(0)  Wrist flexion   60(60) Rest at 60 50(65) 54(60)    Wrist extension   -48(42) 10(40) 20(50) 30 (45) 30(45)  Wrist ulnar deviation     12(22)    Wrist radial deviation     10(20)    Wrist  pronation     (82)90    Wrist supination     64(74) 70 (75)   (Blank rows = not tested)  Eval: L Digit flexion: 2nd:3cm (0cm) 3rd: 0cm (0cm), 4th: 0cm (0cm), 5th: 2cm (0cm)  09/23/23:  L Digit flexion to [facility]: 2nd: 3.5cm (0cm), 3rd: 4 cm(0cm), 4th 3.5cm (0cm), 5th: 2cm (0cm)  Eval:  L Digit extension: Active Gross digit extension through 10% of ROM  09/23/23:  L Digit extension: Active Gross digit extension through 50% of ROM  10/29/23:  L Digit extension: Active Gross digit extension through 25-50% of ROM  12/02/23:  L digit extension: Active gross ext through 30%   12/31/23:  L digit extension:  Active gross ext through ~30-40%   UPPER EXTREMITY MMT:     MMT Right eval Left eval Left 09/23/2023 Left 10/29/23 Left 12/02/23 Left 12/31/23  Shoulder flexion 5 3- 3-/5 3-/5  3-/5  Shoulder abduction 5 3- 3-/5 3-/5  3-5/  Shoulder adduction        Shoulder extension        Shoulder internal rotation        Shoulder external rotation        Middle trapezius        Lower trapezius        Elbow flexion 5 TBD 3+/5 4-/5 4+/5 4+/5  Elbow extension 5 TBD 4-/5 3-/5 4+/5 4+/5  Wrist flexion   2/5 3-/5 3-   Wrist extension  2- 2/5 2+/5 3- 3-/5  Wrist ulnar deviation        Wrist radial deviation        Wrist pronation    3/5 3+   Wrist supination    3-/5 3-   (Blank rows = not tested)  HAND FUNCTION:  Eval:  Grip strength: Right: 94#, Left: 1#,  Lateral Key Pinch strength: Right: 14#, Left: 3#  10/29/23:  Grip strength: Left: 16# Lateral Key Pinch strength: Left: 5#  12/02/23: L: 5 lbs, R: 98 # Lateral pinch: Left: 5 lbs   12/31/23:   Grip strength: Right: 105#, Left: 14#,   COORDINATION: Box and Blocks: Right: 34 blocks completed in 1 min., Left: 0 blocks completed in 1 min.   10/29/23:  Box and Blocks: Right: 34 blocks completed in 1 min., Left: 2 blocks completed in 1 min.  9 Hole Peg Test: Left: To remove 9 pegs from a vertical position: 1 min. &13  sec.  12/02/23: Box and Blocks: 4 blocks 12/02/23: 4 pegs out in 3 min (multiple pulled and dropped out of the cup)   12/02/23: Box and Blocks Test: 5 blocks in 1 min.  FUGL-MEYER ASSESSMENT   A. Upper Extremity   I. Reflex Activity:   12/12/23   Flexors (biceps) 2   Extensors (triceps) 2   Total 4/4                 II. Volitional Movement within Synergies:   12/12/23   Flexor Synergy:      Shoulder Elevation 2   Shoulder Retraction 2   Shoulder Abduction (at least 90*) 2   External Rotation 1   Elbow Flexion 2   Forearm Supination 1   Extensor Synergy:      Shoulder Adduction/Internal Rotation 2   Elbow Extension 1   Forearm Pronation 2   Total 15/18     III. Movement Combining Synergies:   12/12/23   Hand to Lumbar Spine 1   Shoulder Flexion at 90*, elbow at 0* 0   Pron/Sup w/elbow at 90* and shoulder at 0* 1   Total 2/6     IV. Movement Out of Synergy   12/12/23   Shoulder Abduction to 90*, elbow at 0*, forearm pronated 0   Shoulder Flexion 9)-180*, elbow at 0*, forearm in mid position 0   Pron/sup, elbow at 0* and shoulder between 30-90* of flexion 0   Total 0/6       B. Wrist   12/12/23   Stability of wrist at 15* ext, elbow at 90*, shoulder 0* 2   Repeated flex/ext, elbow at 90*, shoulder 0* 1   Stability of wrist at 15* ext, elbow at 0*, shoulder 30* 0   Flex/ext, elbow 0*, shoulder 30* 0   Circumduction  1   Total 4/10     C. Hand   12/12/23   Mass Flexion 1   Mass Extension  1   Total 2/4       D. Grasp   12/12/23   Hook  0   Thumb Adduction - paper 1   Pincer - pen 0   Cylindrical - cup/can 0   Spherical - tennis ball 1   Total 2/10     E. Coordination/Speed   12/12/23   Tremor 2   Dysmetria 0   Time 1   Total 3/6     FUGL-MEYER ASSESSMENT: UPPER EXTREMITY A-E TOTAL on (date): 32/66   SENSATION: Sensation feels different in both hands.  L hand Light touch- impaired L hand Proprioception- impaired  EDEMA: Has had hx of edema, and came  to Eval session with swelling in the L hand/wrist/forearm.   *pt. has a laceration from his dog on the dorsal aspect of the L hand.   MUSCLE TONE: Increased tone through the UE and hand flexors.   COGNITION: Overall cognitive status: Within functional limits for tasks assessed  VISION: Subjective report: L sided inattention Baseline vision: Wears glasses for reading only Visual history: Right visual occlusion  VISION ASSESSMENT: To be assessed  PERCEPTION: TBD  PRAXIS: Impaired                                                                                                                 TREATMENT DATE: 02/06/2024    Therapeutic Exercise:   -AROM/AAROM/PROM for the LUE including left shoulder flexion, abduction, horizontal abduction, elbow flexion, extension, forearm supination, pronation, wrist extension, digit MP/PIP/and DIP extension to increase ROM, and decrease stiffness   Estim attended:   -Tx time: 8 min, 42mA CC, duty cycle 50%, ramp 5 sec, cycle time 5/5; electrodes placed at LUE proximal and distal forearm, dorsal aspect, to promote wrist and digit extension.     Therapeutic Activities:  -Facilitated diagonal patterns of movement with the LUE in  preparation for self-grooming tasks. -Facilitated left hand [facility] skills grasping 1/2 cubes from the therapist's hand, and worked on stacking them vertically on the tabletop surface.  -Facilitated left digit extension to work towards controlled releasing of the 1/2 objects from his left hand.   Education: Education details:  [facility] skills, digit extension Person educated: Patient and spouse Education method: Explanation, Demonstration, Tactile cues, Verbal cues, and Handouts Education comprehension: verbalized understanding, further training needed  HOME EXERCISE PROGRAM: -grasping and stacking 1, and 1/2 cubes form the tabletop surface; yellow theraband for LUE strengthening   GOALS: Goals reviewed with patient?  Yes  SHORT TERM GOALS: Target date: 02/11/2024   Pt. Will be independent with HEP for the LUE.  Baseline: 12/31/23: Independent; ongoing 12/03/23: indep with current HEP, though ongoing progressions are being made as UE function improves; 10/29/23: Pt. requires assist from wife with HEPs. Pt. Is consistently working on HEP's at home. 09/19/23: Requires assist from his wife Eval; no current HEP Goal status: Achieved,Ongoing  LONG TERM GOALS: Target date: 03/24/2024  Pt. Will increase L shoulder flexion by 10 degrees to be able to reach up to  shelves.  Baseline: 12/31/23: Left: 95 degrees before scaption, 110 of scaption 12/03/23: 90* before onset of scaption; 10/29/23: 111(115) 09/19/23: Pt. Continues to be limited with reaching up to shelves. Eval: L shoulder flexion is 89 (110). Goal status: progressing, Ongoing  2.  Pt. Will increase L shoulder abduction by 10 degrees to assist with underarm hygiene.  Baseline: 12/31/23: Left: 105(118) 12/03/23: 100*; pt able to demo bilat underarm hygiene with set up; 10/29/23: 888(877) 09/19/23: Pt. Continues to require assist with underarm hygiene.Eval: L shoulder abduction is 98 (120).  Goal status: Progressing, ongoing  3.  Pt. Will improve L wrist extension by 10 degrees to be able to initiate anticipation of grasping for objects.  Baseline: 12/31/23: 30(45) 12/03/23: 30* (45); 10/29/23: 20(55) 09/19/23: Consistent activation noted in the left wrist extensors with facilitation. Eval: -48 (42) Goal status: Ongoing  4.  Pt. Will improve active gross digit extension through 50% of the range to be able to release objects in his hand consistently.  Baseline:12/31/23: Gross digit extension: ~30-40% 12/03/23: Gross digit ext through ~30% of range (limited by flexor tone in the PIPs); 10/28/20: Gross digit extension through 25-50% of the range. 09/19/23: gross digit extension noted through 25% range with facilitation.Eval: gross digit extension is 10% of the range  Goal status:  Ongoing  5.  Pt. Will independently demonstrate visual compensatory strategies when navigating through environment and during tabletop tasks. Baseline: 12/03/23: Pt demonstrates consistent head turns to scan to L visual field without cueing; 10/29/2023: Pt. Continues to less cuing for left sided awareness. 09/19/23: Pt. requires fewer cues for left sided awareness Eval: Pt. Requires consistent cuing for L sided awareness.  Goal status: achieved   6. Pt. Will improve left hand [facility] skills to be able to independently manipulate small objects during ADLs, and IADLs. Baseline: 12/31/23: Pt. Is progressing to be able to grasp progressively smaller objects. 12/03/23: Pt removed 3 pegs in >3 min, pulling out more than 3 but repeatedly dropping to floor or table top rather than assessment dish; 10/29/23: 9 Hole Peg Test: Pt. Was able to remove 9 pegs from vertical position on the pegboard in 1 min. & 13 sec. Pt. Is not yet able to pick up pegs from a horizontal position to place them in the pegboard.   Goal status: ongoing   7. Pt will improve left hand function skills as evidenced by improved score to 4 blocks on the Box and Blocks test. Baseline: 12/31/23: 4 blocks in 1 min. 12/03/23: 4 blocks after 3 trials and passive wrist and digit stretching between trials (not yet consistent to achieve 4 blocks); 10/29/23: Box and Blocks Test: 2 Blocks completed in 1 min.   Goal Status: ongoing   ASSESSMENT:  CLINICAL IMPRESSION:   Pt. Continues to require hand over hand assist for all hand to face patterns of movement for self-grooming tasks. Pt. continues to tolerate EStim well to the left wrist, and digit extensors. Pt.  was able to stack 2 1/2 cubes vertically on the tabletop surface with cuing for digit extension for controlled release of the objects from his left hand. Pt. continues to benefit from OT services to work on improving LUE functioning to increase engagement of the LUE during daily tasks, and to provide  education about compensatory strategies during ADLs/IADLs.   PERFORMANCE DEFICITS: in functional skills including ADLs, IADLs, coordination, dexterity, proprioception, sensation, edema, tone, ROM, strength, pain, Fine motor control, Gross motor control, mobility, balance, endurance, vision, and UE functional use, cognitive  skills including perception, and psychosocial skills including coping strategies, environmental adaptation, and routines and behaviors.   IMPAIRMENTS: are limiting patient from ADLs, IADLs, and leisure.   CO-MORBIDITIES: may have co-morbidities  that affects occupational performance. Patient will benefit from skilled OT to address above impairments and improve overall function.  MODIFICATION OR ASSISTANCE TO COMPLETE EVALUATION: Min-Moderate modification of tasks or assist with assess necessary to complete an evaluation.  OT OCCUPATIONAL PROFILE AND HISTORY: Detailed assessment: Review of records and additional review of physical, cognitive, psychosocial history related to current functional performance.  CLINICAL DECISION MAKING: Moderate - several treatment options, min-mod task modification necessary  REHAB POTENTIAL: Good  EVALUATION COMPLEXITY: Moderate  PLAN:  OT FREQUENCY: 2x/week  OT DURATION: 12 weeks  PLANNED INTERVENTIONS: 97168 OT Re-evaluation, 97535 self care/ADL training, 02889 therapeutic exercise, 97530 therapeutic activity, 97112 neuromuscular re-education, 97140 manual therapy, 97018 paraffin, 02989 moist heat, 97010 cryotherapy, 97034 contrast bath, 97760 Orthotic Initial, 97763 Orthotic/Prosthetic subsequent, passive range of motion, visual/perceptual remediation/compensation, energy conservation, and DME and/or AE instructions  RECOMMENDED OTHER SERVICES: PT and ST  CONSULTED AND AGREED WITH PLAN OF CARE: Patient and family member/caregiver  PLAN FOR NEXT SESSION: see above  Richardson Otter, MS, OTR/L   02/06/24, 6:18 PM

## 2024-02-06 NOTE — Therapy (Signed)
 OUTPATIENT PHYSICAL THERAPY TREATMENT  Patient Name: [name]. MRN: [id number] DOB:1950/11/05, 73 y.o., male Today's Date: 02/06/2024 PCP: Dennise Remak, MD  REFERRING PROVIDER:  Pegge Toribio PARAS, PA-C  END OF SESSION:   PT End of Session - 02/06/24 1315     Visit Number 48    Number of Visits 64    Date for Recertification  03/24/24    Authorization Type U[employer]    Authorization Time Period [facility] auth#; [id number] A for 16 PT vst from 10/09-01/02/2025    Authorization - Number of Visits 16    Progress Note Due on Visit 50    PT Start Time 1315    PT Stop Time 1400    PT Time Calculation (min) 45 min    Equipment Utilized During Treatment Gait belt    Activity Tolerance Patient tolerated treatment well;No increased pain    Behavior During Therapy WFL for tasks assessed/performed          Past Medical History:  Diagnosis Date   Anemia    Cervical myelopathy (HCC)    Chronic venous insufficiency    [name] (chronic kidney disease), stage III (HCC)    Diabetes mellitus without complication (HCC)    GERD (gastroesophageal reflux disease)    Hypercholesteremia    Hypothyroidism    Kidney stones    about 50 in the past--last in March 4/24   Leg weakness, bilateral    due to Myasthenia Gravis   Myasthenia gravis (HCC)    Spinal stenosis    Vitamin B 12 deficiency    Vitamin B 12 deficiency    Wears hearing aid    bilateral   Past Surgical History:  Procedure Laterality Date   BACK SURGERY  09/2015   CARDIAC CATHETERIZATION     CATARACT EXTRACTION W/ INTRAOCULAR LENS IMPLANT     CATARACT EXTRACTION W/PHACO Right 12/21/2015   Procedure: CATARACT EXTRACTION PHACO AND INTRAOCULAR LENS PLACEMENT (IOC);  Surgeon: Dene Etienne, MD;  Location: [facility];  Service: Ophthalmology;  Laterality: Right;  DIABETIC - insulin  and oral meds   COLONOSCOPY     SHOULDER SURGERY     labrum repair   TONSILLECTOMY     Patient Active Problem List   Diagnosis Date  Noted   Acquired left foot drop 01/10/2024   Left spastic hemiplegia (HCC) 01/10/2024   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) 10/11/2023   High blood pressure 08/13/2023   Anemia 08/13/2023   Constipation 08/13/2023   Myasthenia gravis (HCC) 08/13/2023   Acute ischemic right MCA stroke (HCC) 07/19/2023   Lymphedema 12/27/2016   Leg pain 07/15/2016   Chronic venous insufficiency 07/15/2016   Swelling of limb 07/15/2016   Type 2 diabetes mellitus (HCC) 07/15/2016   Hyperlipidemia 07/15/2016    ONSET DATE: 07/19/23  REFERRING DIAG:  P36.488 (ICD-10-CM) - Cerebral infarction due to unspecified occlusion or stenosis of right middle cerebral artery   THERAPY DIAG:  Muscle weakness (generalized)  Hemiplegia and hemiparesis following cerebral infarction affecting left non-dominant side (HCC)  Other lack of coordination  Difficulty in walking, not elsewhere classified  Abnormality of gait and mobility  Unsteadiness on feet  Cognitive communication deficit  Vision disturbance  Rationale for Evaluation and Treatment: Rehabilitation  SUBJECTIVE:  SUBJECTIVE STATEMENT:   Pt reports that he felt tired after the last session.  Pt notes no new complaints upon arrival today.   From session on 01/14/2024: Patient states the MD suggested/recommended patient get an AFO for his L LE and patient has an appointment with [facility] the 2nd week of November for an assessment.     PERTINENT HISTORY:  CVA with Left hemiplegia in April 2025. Pt started on a swedish knee cage at CIR due to uncontrolled hyperextension in stance. Pt had not been moving much since discharging from CIR, as therapists instructed pt to perform transfers only at home due to high fall  risk with ambulation. Continues to have significant weakenss in the LUE with difficulty extending fingers as well as numbness and inattention to the L side of body resulting in multiple cuts on Arm and leg with WC mobility in home. Will be attaining custom power WC from [employer]. Has loaner currently.   PAIN:  Are you having pain? No  PRECAUTIONS: Fall, history of wounds on LLE   *Latex allergy   WEIGHT BEARING RESTRICTIONS: No  FALLS: Has patient fallen in last 6 months? Yes. Number of falls 1  LIVING ENVIRONMENT: Lives with: lives with their spouse Lives in: House/apartment Stairs: Ramps installed while in the Hospital  Has following equipment at home: Vannie - 2 wheeled and Wheelchair (power)  PLOF: Independent with basic ADLs, Independent with household mobility without device, and Independent with community mobility with device with [facility]   PATIENT GOALS: relearn to Walk 15-75ft.   OBJECTIVE:                                                                                                                              TREATMENT DATE: 02/06/2024   Pt arrives in transport chair.   Pt agreeable to continue participating in higher intensity gait training using litegait overground to address more balance deficits. Donned L swedish knee cage for gait.  Transfer from transport chair to green chair using bari-RW and light min A of 1 for balance.  Donned litegait harness.   Gait training overground; the following trials completed in litegait harness for safety, but not providing BWS (body weight support), with therapist assist for driving litegait.  Pt able to ambulate around the gym, down the longer hallway towards the cafeteria, down the EVS hallway, and back to the hallway outside of the gym.  Total distance accumulated is ~500' at one time.  Pt did not take any standing rest breaks, throughout the attempt and reported a 14 on the BORG scale  Pt then attempted an ambulation attempt  down the hallway outside of the clinic with retro ambulation before returning to the gym and transferring back to the transport chair via the LiteGait system.   Of note, pt completing above gait training without seated or standing rest break between.    Pt will benefit from continued participation in overground gait training with  use of litegait for balance safety to further train stepping strategies to maintain balance without reliance on R UE support.  Doffed litegait harness.   Exited session in transport chair and in the care of his wife for transport to OT appointment.    PATIENT EDUCATION: Education details: knee cage needs assessmnet as it is causeing crossover gait.  Person educated: Patient and Spouse Education method: Explanation, VC/TC/Demo Education comprehension: verbalized understanding  HOME EXERCISE PROGRAM: Access Code: [email or web address] URL: [email or web address] Date: 01/14/2024 Prepared by: Connell Kiss  Exercises - Seated Knee Extension AROM  - 1 x daily - 4 x weekly - 3 sets - 10 reps - Seated Hip Abduction  - 1 x daily - 4 x weekly - 3 sets - 10 reps - Seated March  - 1 x daily - 4 x weekly - 3 sets - 10 reps - Sit to Stand with Counter Support  - 1 x daily - 4 x weekly - 3 sets - 10 reps - Supine Short Arc Quad  - 1 x daily - 4 x weekly - 3 sets - 10 reps - Supine Hip Abduction  - 1 x daily - 4 x weekly - 3 sets - 10 reps - Supine Bridge  - 1 x daily - 4 x weekly - 3 sets - 3 reps - 2sec  hold - Seated Hamstring Stretch with Strap  - 2-3 x daily - 7 x weekly - 2 sets - 30 second to 1 minute hold - Seated Knee Flexion Slide  - 2-3 x daily - 7 x weekly - 2 sets - 30 seconds to 1 minute hold  12/17/2023: verbal instruction to complete repeated sit<>stands with education on safe set-up provided  GOALS: Goals reviewed with patient? Yes  SHORT TERM GOALS: Target date: 02/11/2024  Patient will be independent in home exercise program to improve  strength/mobility for better functional independence with ADLs. Baseline: Initiated on 09/11/2023 10/22/2023: will update when appropriate 11/26/2023: patient participating in transfer and gait practice in controlled environment with 2 person assistance at home in addition to HEP 12/31/2023: patient participating in gait using bari-RW at home with family assistance Goal status: IN PROGRESS  LONG TERM GOALS: Target date: 03/24/2024  Patient will increase SIS-16 score to equal to or greater than 10points to demonstrate statistically significant improvement in mobility and quality of life.  Baseline: 49/80 09/19/2023: 45/80 10/22/2023: 46/80 11/26/2023: 47/80 12/31/2023: 53/80 Goal status: IN PROGRESS  2.  Patient (> 67 years old) will complete five times sit to stand test in < 15 seconds indicating an increased LE strength and improved balance. Baseline: 1:26min 09/19/2023: 56.39 seconds using R UE support to push-up from armrest of green chair and requiring skilled min A for balance and lifting/lowering  10/22/2023: 27.10 seconds using R UE support to push-up from armrest  8/12: 27.7 sec with RUE pushing from arm rest.  11/26/2023: 37.42 seconds using R UE support to push-up from armrest of green chair and not using L UE support with min-modA for facilitating anterior weight shift and lifting to come to stand secondary to fatigue (would benefit from re-testing at next session) 12/17/2023: 24.59 seconds using R UE support to push-up from armrest of green chair and not using L UE support with CGA/light min A for safety/steadying  Goal status: IN PROGRESS  3.  Patient will increase FIST score by > 6 points to demonstrate decreased fall risk during functional activities Baseline:  43/56  09/23/2023: 53/56 Goal status: MET and UPGRADED  09/23/2023: Patient will increase Berg Balance score to > 45/56 to demonstrate improved balance and decreased fall risk during functional activities and ADLs.  Baseline:  09/23/2023: 14/56 7/31: 20/56 using RW support 11/26/2023: 17/56 without AD support 12/31/2023: 21/56 without AD support Goal status: IN PROGRESS  4.  Patient will increase 10 meter walk test to >0.7m/s as to improve gait speed for better community ambulation and to reduce fall risk. Baseline: 09/09/2023: 0.103 m/s ( and 37 seconds) using bari-RW with skilled heavy min A and +2 w/c follow for safety  09/19/2023: 0.88m/s using bari-RW with skilled min A of 1 and +2 w/c follow for safety 10/22/2023: 0.1875 m/s using bari-RW with L hand splint, L swedish knee cage, and only skilled light min A of 1 (no wheelchair follow)  8/12: 0.140m/s with RW, and L hand splint. No knee cage on this day.  11/26/2023: Average Normal speed: 0.20 m/s & Average Fast speed: 0.275 m/s using bari-RW with CGA/min A for balance wearing [facility] 12/17/2023: Average Normal speed: 0.32 m/s using bari-RW with CGA & Average Fast speed: 0.36 m/s using bari-RW requiring CGA/light min A  12/31/2023: Average Normal speed: 0.3275 m/s using bari-RW with CGA/light min A for steadying Goal status: IN PROGRESS  5.  Patient will reduce timed up and go to <11 seconds to reduce fall risk and demonstrate improved transfer/gait ability. Baseline: unable to complete turn at 10 ft. Mod-max assist due to poor control of LW on RW, resulting in L lateral LOB  09/09/2023: and 32 seconds using bari-RW with L hand splint and +2 on R side for safety, therapist providing skilled min A on L side (Had pt set-up L hand on RW splint, get L foot in proper positioning, and scoot forward in seat before starting the timer) 09/19/2023: 7min43 seconds using bari-RW with only skilled min A of 1 (Had pt set-up L hand on RW splint, get L foot in proper positioning, and scoot forward in seat before starting the timer) 7/2: 78sec(1:18.125) with RW and min assist overall.  10/22/2023: 1 min, 30 seconds using bari-RW with L hand splint and skilled light min assist  8/12: 1:01  min with RW and L hand splint.  11/26/2023: 51.57 seconds using bari-RW with hand splint, wearing SKC, and CGA-minA for balance 12/31/2023: 43.88 seconds using bari-RW with hand splint, wearing SKC, and CGA/occasional light min A   Goal status: IN PROGRESS   ASSESSMENT:  CLINICAL IMPRESSION:   Pt responded well to the gait training and was able to handle the R sided turns when navigating the hallways today.  Pt did not have any significant instances of instability where therapist had to assist the pt with the recovery process.  Pt did have a few instances of instability where the pt was able to self-correct.  Pt with noticeable fatigue at the conclusion of the session and will continue to benefit from more LiteGait training in order to improve overall tolerance to ambulation attempts.   Pt will continue to benefit from skilled therapy to address remaining deficits in order to improve overall QoL and return to PLOF.        OBJECTIVE IMPAIRMENTS: Abnormal gait, cardiopulmonary status limiting activity, decreased activity tolerance, decreased balance, decreased cognition, decreased coordination, decreased endurance, decreased knowledge of condition, decreased knowledge of use of DME, decreased mobility, difficulty walking, decreased ROM, decreased strength, decreased safety awareness, dizziness, hypomobility, increased fascial restrictions, impaired perceived functional ability, increased muscle spasms, impaired sensation, impaired tone, impaired UE functional  use, impaired vision/preception, improper body mechanics, postural dysfunction, and obesity.   ACTIVITY LIMITATIONS: carrying, lifting, bending, sitting, standing, squatting, stairs, transfers, bed mobility, bathing, toileting, dressing, reach over head, hygiene/grooming, locomotion level, and caring for others  PARTICIPATION LIMITATIONS: meal prep, cleaning, laundry, medication management, interpersonal relationship, driving, shopping, community  activity, and yard work  PERSONAL FACTORS: Age, Past/current experiences, Time since onset of injury/illness/exacerbation, and 3+ comorbidities: MG, HTN, CVA, lymphedema are also affecting patient's functional outcome.   REHAB POTENTIAL: Good  CLINICAL DECISION MAKING: Unstable/unpredictable  EVALUATION COMPLEXITY: High  PLAN:  PT FREQUENCY: 1-2x/week  PT DURATION: 8 weeks  PLANNED INTERVENTIONS: 97164- PT Re-evaluation, 97750- Physical Performance Testing, 97110-Therapeutic exercises, 97530- Therapeutic activity, [id number]- Neuromuscular re-education, 97535- Self Care, [id number]- Manual therapy, [id number]- Gait training, [phone]- Orthotic Initial, [phone]- Orthotic/Prosthetic subsequent, [phone]- Canalith repositioning, [id number]- Electrical stimulation (unattended), [phone]- Electrical stimulation (manual), [id number]- Wound care (first 20 sq cm), 97598- Wound care (each additional 20 sq cm), Patient/Family education, Balance training, Stair training, Taping, Dry Needling, Joint mobilization, Joint manipulation, Vestibular training, Visual/preceptual remediation/compensation, Cognitive remediation, DME instructions, Wheelchair mobility training, Cryotherapy, and Moist heat  PLAN FOR NEXT SESSION:    Add STS to HEP HIGT (high intensity gait training) using litegait harness either on treadmill vs overground Continue overground gait training in litegait harness at next visit - decreasing reliance on R UE support Continue stair navigation training Dynamic gait training using bari-RW vs litegait Backwards Side stepping Obstacle navigation (turning) Working on forward propulsion with increased L hip extension and forward progression of pelvis over L stance phase Dynamic standing balance of trunk rotation Continue monitoring SKC fit & see if posterior knee strap needs to be adjusted    Fonda Simpers, PT, DPT Physical Therapist - [facility]  02/06/24, 9:19 PM

## 2024-02-11 ENCOUNTER — Ambulatory Visit

## 2024-02-11 ENCOUNTER — Ambulatory Visit: Admitting: Occupational Therapy

## 2024-02-11 DIAGNOSIS — I69354 Hemiplegia and hemiparesis following cerebral infarction affecting left non-dominant side: Secondary | ICD-10-CM

## 2024-02-11 DIAGNOSIS — R278 Other lack of coordination: Secondary | ICD-10-CM

## 2024-02-11 DIAGNOSIS — M6281 Muscle weakness (generalized): Secondary | ICD-10-CM | POA: Diagnosis not present

## 2024-02-11 DIAGNOSIS — R2681 Unsteadiness on feet: Secondary | ICD-10-CM

## 2024-02-11 DIAGNOSIS — R262 Difficulty in walking, not elsewhere classified: Secondary | ICD-10-CM

## 2024-02-11 DIAGNOSIS — R269 Unspecified abnormalities of gait and mobility: Secondary | ICD-10-CM

## 2024-02-11 DIAGNOSIS — R41841 Cognitive communication deficit: Secondary | ICD-10-CM

## 2024-02-11 NOTE — Therapy (Signed)
 OUTPATIENT PHYSICAL THERAPY TREATMENT  Patient Name: [name]. MRN: [id number] DOB:[birth year], 73 y.o., male Today's Date: 02/11/2024 PCP: Dennise Remak, MD  REFERRING PROVIDER:  Pegge Toribio PARAS, PA-C  END OF SESSION:   PT End of Session - 02/11/24 1305     Visit Number 49    Number of Visits 64    Date for Recertification  03/24/24    Authorization Type U[employer]    Authorization Time Period [facility] auth#; [id number] A for 16 PT vst from 10/09-01/02/2025    Authorization - Number of Visits 16    Progress Note Due on Visit 50    PT Start Time 1315    PT Stop Time 1400    PT Time Calculation (min) 45 min    Equipment Utilized During Treatment Gait belt    Activity Tolerance Patient tolerated treatment well;No increased pain    Behavior During Therapy WFL for tasks assessed/performed          Past Medical History:  Diagnosis Date   Anemia    Cervical myelopathy (HCC)    Chronic venous insufficiency    [name] (chronic kidney disease), stage III (HCC)    Diabetes mellitus without complication (HCC)    GERD (gastroesophageal reflux disease)    Hypercholesteremia    Hypothyroidism    Kidney stones    about 50 in the past--last in March 4/24   Leg weakness, bilateral    due to Myasthenia Gravis   Myasthenia gravis (HCC)    Spinal stenosis    Vitamin B 12 deficiency    Vitamin B 12 deficiency    Wears hearing aid    bilateral   Past Surgical History:  Procedure Laterality Date   BACK SURGERY  09/2015   CARDIAC CATHETERIZATION     CATARACT EXTRACTION W/ INTRAOCULAR LENS IMPLANT     CATARACT EXTRACTION W/PHACO Right 12/21/2015   Procedure: CATARACT EXTRACTION PHACO AND INTRAOCULAR LENS PLACEMENT (IOC);  Surgeon: Dene Etienne, MD;  Location: [facility];  Service: Ophthalmology;  Laterality: Right;  DIABETIC - insulin  and oral meds   COLONOSCOPY     SHOULDER SURGERY     labrum repair   TONSILLECTOMY     Patient Active Problem List   Diagnosis Date  Noted   Acquired left foot drop 01/10/2024   Left spastic hemiplegia (HCC) 01/10/2024   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) 10/11/2023   High blood pressure 08/13/2023   Anemia 08/13/2023   Constipation 08/13/2023   Myasthenia gravis (HCC) 08/13/2023   Acute ischemic right MCA stroke (HCC) 07/19/2023   Lymphedema 12/27/2016   Leg pain 07/15/2016   Chronic venous insufficiency 07/15/2016   Swelling of limb 07/15/2016   Type 2 diabetes mellitus (HCC) 07/15/2016   Hyperlipidemia 07/15/2016    ONSET DATE: 07/19/23  REFERRING DIAG:  P36.488 (ICD-10-CM) - Cerebral infarction due to unspecified occlusion or stenosis of right middle cerebral artery   THERAPY DIAG:  Muscle weakness (generalized)  Hemiplegia and hemiparesis following cerebral infarction affecting left non-dominant side (HCC)  Other lack of coordination  Difficulty in walking, not elsewhere classified  Abnormality of gait and mobility  Unsteadiness on feet  Cognitive communication deficit  Rationale for Evaluation and Treatment: Rehabilitation  SUBJECTIVE:  SUBJECTIVE STATEMENT:   Pt reports he is doing well.  Pt reports he did not get up much over the weekend, but has been walking around the house some.  Pt reports that he knew he would be walking a lot in therapy today.   From session on 01/14/2024: Patient states the MD suggested/recommended patient get an AFO for his L LE and patient has an appointment with [facility] the 2nd week of November for an assessment.     PERTINENT HISTORY:  CVA with Left hemiplegia in April 2025. Pt started on a swedish knee cage at CIR due to uncontrolled hyperextension in stance. Pt had not been moving much since discharging from [facility], as  therapists instructed pt to perform transfers only at home due to high fall risk with ambulation. Continues to have significant weakenss in the LUE with difficulty extending fingers as well as numbness and inattention to the L side of body resulting in multiple cuts on Arm and leg with WC mobility in home. Will be attaining custom power WC from [employer]. Has loaner currently.   PAIN:  Are you having pain? No  PRECAUTIONS: Fall, history of wounds on LLE   *Latex allergy   WEIGHT BEARING RESTRICTIONS: No  FALLS: Has patient fallen in last 6 months? Yes. Number of falls 1  LIVING ENVIRONMENT: Lives with: lives with their spouse Lives in: House/apartment Stairs: Ramps installed while in the Hospital  Has following equipment at home: Vannie - 2 wheeled and Wheelchair (power)  PLOF: Independent with basic ADLs, Independent with household mobility without device, and Independent with community mobility with device with [facility]   PATIENT GOALS: relearn to Walk 15-30ft.   OBJECTIVE:                                                                                                                              TREATMENT DATE: 02/11/2024  Pt arrives in transport chair.   Pt agreeable to continue participating in higher intensity gait training using litegait overground to address more balance deficits. Donned L swedish knee cage for gait.  Transfer from transport chair to green chair using bari-RW and light min A of 1 for balance.  Donned litegait harness.   Gait training overground; the following trials completed in litegait harness for safety, but not providing BWS (body weight support), with therapist assist for driving litegait.  Pt able to ambulate around the gym, down EVS hallway, past cafeteria, down main hallway to rehab area, and on the rehab hallway, pt performed ambulation without the R UE on the LiteGait. Total distance accumulated is ~500' at one time.  Pt did not take any standing rest  breaks, throughout the attempt, other than one short instance where therapist had to lower the LiteGait support and adjust straps to prevent hitting sign   Of note, pt completing above gait training without seated or standing rest break between.   Pt will benefit from continued participation in  overground gait training with use of litegait for balance safety to further train stepping strategies to maintain balance without reliance on R UE support.  Doffed litegait harness.   Exited session in transport chair and in the care of his wife for transport to OT appointment.    PATIENT EDUCATION: Education details: knee cage needs assessmnet as it is causeing crossover gait.  Person educated: Patient and Spouse Education method: Explanation, VC/TC/Demo Education comprehension: verbalized understanding  HOME EXERCISE PROGRAM: Access Code: [email or web address] URL: [email or web address] Date: 01/14/2024 Prepared by: Connell Kiss  Exercises - Seated Knee Extension AROM  - 1 x daily - 4 x weekly - 3 sets - 10 reps - Seated Hip Abduction  - 1 x daily - 4 x weekly - 3 sets - 10 reps - Seated March  - 1 x daily - 4 x weekly - 3 sets - 10 reps - Sit to Stand with Counter Support  - 1 x daily - 4 x weekly - 3 sets - 10 reps - Supine Short Arc Quad  - 1 x daily - 4 x weekly - 3 sets - 10 reps - Supine Hip Abduction  - 1 x daily - 4 x weekly - 3 sets - 10 reps - Supine Bridge  - 1 x daily - 4 x weekly - 3 sets - 3 reps - 2sec  hold - Seated Hamstring Stretch with Strap  - 2-3 x daily - 7 x weekly - 2 sets - 30 second to 1 minute hold - Seated Knee Flexion Slide  - 2-3 x daily - 7 x weekly - 2 sets - 30 seconds to 1 minute hold  12/17/2023: verbal instruction to complete repeated sit<>stands with education on safe set-up provided  GOALS: Goals reviewed with patient? Yes  SHORT TERM GOALS: Target date: 02/11/2024  Patient will be independent in home exercise program to improve  strength/mobility for better functional independence with ADLs. Baseline: Initiated on 09/11/2023 10/22/2023: will update when appropriate 11/26/2023: patient participating in transfer and gait practice in controlled environment with 2 person assistance at home in addition to HEP 12/31/2023: patient participating in gait using bari-RW at home with family assistance Goal status: IN PROGRESS  LONG TERM GOALS: Target date: 03/24/2024  Patient will increase SIS-16 score to equal to or greater than 10points to demonstrate statistically significant improvement in mobility and quality of life.  Baseline: 49/80 09/19/2023: 45/80 10/22/2023: 46/80 11/26/2023: 47/80 12/31/2023: 53/80 Goal status: IN PROGRESS  2.  Patient (> 104 years old) will complete five times sit to stand test in < 15 seconds indicating an increased LE strength and improved balance. Baseline: 1:16min 09/19/2023: 56.39 seconds using R UE support to push-up from armrest of green chair and requiring skilled min A for balance and lifting/lowering  10/22/2023: 27.10 seconds using R UE support to push-up from armrest  8/12: 27.7 sec with RUE pushing from arm rest.  11/26/2023: 37.42 seconds using R UE support to push-up from armrest of green chair and not using L UE support with min-modA for facilitating anterior weight shift and lifting to come to stand secondary to fatigue (would benefit from re-testing at next session) 12/17/2023: 24.59 seconds using R UE support to push-up from armrest of green chair and not using L UE support with CGA/light min A for safety/steadying  Goal status: IN PROGRESS  3.  Patient will increase FIST score by > 6 points to demonstrate decreased fall risk during functional activities Baseline:  43/56  09/23/2023: 53/56 Goal  status: MET and UPGRADED  09/23/2023: Patient will increase Berg Balance score to > 45/56 to demonstrate improved balance and decreased fall risk during functional activities and ADLs.  Baseline:  09/23/2023: 14/56 7/31: 20/56 using RW support 11/26/2023: 17/56 without AD support 12/31/2023: 21/56 without AD support Goal status: IN PROGRESS  4.  Patient will increase 10 meter walk test to >0.26m/s as to improve gait speed for better community ambulation and to reduce fall risk. Baseline: 09/09/2023: 0.103 m/s ( and 37 seconds) using bari-RW with skilled heavy min A and +2 w/c follow for safety  09/19/2023: 0.33m/s using bari-RW with skilled min A of 1 and +2 w/c follow for safety 10/22/2023: 0.1875 m/s using bari-RW with L hand splint, L swedish knee cage, and only skilled light min A of 1 (no wheelchair follow)  8/12: 0.167m/s with RW, and L hand splint. No knee cage on this day.  11/26/2023: Average Normal speed: 0.20 m/s & Average Fast speed: 0.275 m/s using bari-RW with CGA/min A for balance wearing [facility] 12/17/2023: Average Normal speed: 0.32 m/s using bari-RW with CGA & Average Fast speed: 0.36 m/s using bari-RW requiring CGA/light min A  12/31/2023: Average Normal speed: 0.3275 m/s using bari-RW with CGA/light min A for steadying Goal status: IN PROGRESS  5.  Patient will reduce timed up and go to <11 seconds to reduce fall risk and demonstrate improved transfer/gait ability. Baseline: unable to complete turn at 10 ft. Mod-max assist due to poor control of LW on RW, resulting in L lateral LOB  09/09/2023: and 32 seconds using bari-RW with L hand splint and +2 on R side for safety, therapist providing skilled min A on L side (Had pt set-up L hand on RW splint, get L foot in proper positioning, and scoot forward in seat before starting the timer) 09/19/2023: 63min43 seconds using bari-RW with only skilled min A of 1 (Had pt set-up L hand on RW splint, get L foot in proper positioning, and scoot forward in seat before starting the timer) 7/2: 78sec(1:18.125) with RW and min assist overall.  10/22/2023: 1 min, 30 seconds using bari-RW with L hand splint and skilled light min assist  8/12: 1:01  min with RW and L hand splint.  11/26/2023: 51.57 seconds using bari-RW with hand splint, wearing SKC, and CGA-minA for balance 12/31/2023: 43.88 seconds using bari-RW with hand splint, wearing SKC, and CGA/occasional light min A   Goal status: IN PROGRESS   ASSESSMENT:  CLINICAL IMPRESSION:   Pt continues to respond well to the ambulation attempts, however pt was having increased difficulty with preventing his LE's from crossing over at times.  Pt also needed increased verbal cues for lifting the L LE and to continue large strides throughout she ambulation attempt.  Pt appreciative of the ambulation attempts as he was unable to mobilize as much at home over the weekend.   Pt will continue to benefit from skilled therapy to address remaining deficits in order to improve overall QoL and return to PLOF.         OBJECTIVE IMPAIRMENTS: Abnormal gait, cardiopulmonary status limiting activity, decreased activity tolerance, decreased balance, decreased cognition, decreased coordination, decreased endurance, decreased knowledge of condition, decreased knowledge of use of DME, decreased mobility, difficulty walking, decreased ROM, decreased strength, decreased safety awareness, dizziness, hypomobility, increased fascial restrictions, impaired perceived functional ability, increased muscle spasms, impaired sensation, impaired tone, impaired UE functional use, impaired vision/preception, improper body mechanics, postural dysfunction, and obesity.   ACTIVITY LIMITATIONS: carrying, lifting,  bending, sitting, standing, squatting, stairs, transfers, bed mobility, bathing, toileting, dressing, reach over head, hygiene/grooming, locomotion level, and caring for others  PARTICIPATION LIMITATIONS: meal prep, cleaning, laundry, medication management, interpersonal relationship, driving, shopping, community activity, and yard work  PERSONAL FACTORS: Age, Past/current experiences, Time since onset of  injury/illness/exacerbation, and 3+ comorbidities: MG, HTN, CVA, lymphedema are also affecting patient's functional outcome.   REHAB POTENTIAL: Good  CLINICAL DECISION MAKING: Unstable/unpredictable  EVALUATION COMPLEXITY: High  PLAN:  PT FREQUENCY: 1-2x/week  PT DURATION: 8 weeks  PLANNED INTERVENTIONS: 97164- PT Re-evaluation, 97750- Physical Performance Testing, 97110-Therapeutic exercises, 97530- Therapeutic activity, [id number]- Neuromuscular re-education, 97535- Self Care, [id number]- Manual therapy, [id number]- Gait training, [phone]- Orthotic Initial, [phone]- Orthotic/Prosthetic subsequent, [phone]- Canalith repositioning, [id number]- Electrical stimulation (unattended), [phone]- Electrical stimulation (manual), [id number]- Wound care (first 20 sq cm), 97598- Wound care (each additional 20 sq cm), Patient/Family education, Balance training, Stair training, Taping, Dry Needling, Joint mobilization, Joint manipulation, Vestibular training, Visual/preceptual remediation/compensation, Cognitive remediation, DME instructions, Wheelchair mobility training, Cryotherapy, and Moist heat  PLAN FOR NEXT SESSION:     Add STS to HEP HIGT (high intensity gait training) using litegait harness either on treadmill vs overground Continue overground gait training in litegait harness at next visit - decreasing reliance on R UE support Continue stair navigation training Dynamic gait training using bari-RW vs litegait Backwards Side stepping Obstacle navigation (turning) Working on forward propulsion with increased L hip extension and forward progression of pelvis over L stance phase Dynamic standing balance of trunk rotation Continue monitoring SKC fit & see if posterior knee strap needs to be adjusted    Fonda Simpers, PT, DPT Physical Therapist - [facility]  02/11/24, 1:05 PM

## 2024-02-11 NOTE — Therapy (Incomplete)
 Outpatient Occupational Therapy Neuro Treatment Note  Patient Name: [name]. MRN: [id number] DOB:06/04/1950, 73 y.o., male Today's Date: 02/11/2024  PCP: Alm Rower, MD REFERRING PROVIDER: Hermann Toribio PARAS, PA-C  END OF SESSION:  OT End of Session - 02/11/24 1410     Visit Number 50    Number of Visits 72    Date for Recertification  03/24/24    OT Start Time 1605    OT Stop Time 1645    OT Time Calculation (min) 40 min    Activity Tolerance Patient tolerated treatment well    Behavior During Therapy WFL for tasks assessed/performed           Past Medical History:  Diagnosis Date   Anemia    Cervical myelopathy (HCC)    Chronic venous insufficiency    [name] (chronic kidney disease), stage III (HCC)    Diabetes mellitus without complication (HCC)    GERD (gastroesophageal reflux disease)    Hypercholesteremia    Hypothyroidism    Kidney stones    about 50 in the past--last in March 4/24   Leg weakness, bilateral    due to Myasthenia Gravis   Myasthenia gravis (HCC)    Spinal stenosis    Vitamin B 12 deficiency    Vitamin B 12 deficiency    Wears hearing aid    bilateral   Past Surgical History:  Procedure Laterality Date   BACK SURGERY  09/2015   CARDIAC CATHETERIZATION     CATARACT EXTRACTION W/ INTRAOCULAR LENS IMPLANT     CATARACT EXTRACTION W/PHACO Right 12/21/2015   Procedure: CATARACT EXTRACTION PHACO AND INTRAOCULAR LENS PLACEMENT (IOC);  Surgeon: Dene Etienne, MD;  Location: [facility];  Service: Ophthalmology;  Laterality: Right;  DIABETIC - insulin  and oral meds   COLONOSCOPY     SHOULDER SURGERY     labrum repair   TONSILLECTOMY     Patient Active Problem List   Diagnosis Date Noted   Acquired left foot drop 01/10/2024   Left spastic hemiplegia (HCC) 01/10/2024   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) 10/11/2023   High blood pressure 08/13/2023   Anemia 08/13/2023    Constipation 08/13/2023   Myasthenia gravis (HCC) 08/13/2023   Acute ischemic right MCA stroke (HCC) 07/19/2023   Lymphedema 12/27/2016   Leg pain 07/15/2016   Chronic venous insufficiency 07/15/2016   Swelling of limb 07/15/2016   Type 2 diabetes mellitus (HCC) 07/15/2016   Hyperlipidemia 07/15/2016   ONSET DATE: 07/13/2023  REFERRING DIAG: Acute ischemic R MCA CVA  THERAPY DIAG: muscle weakness (generalized), other lack of coordination, vision disturbance, hemiplegia and hemiparesis following cerebral infarction affecting left non-dominant side (HCC)  Rationale for Evaluation and Treatment: Rehabilitation  SUBJECTIVE:  SUBJECTIVE STATEMENT:  Pt. Reports that he is having to have a consult for ViviStim with a physician at [facility]. Pt accompanied by: [name]  PERTINENT HISTORY: Pt. Was admitted to [facility] on 07/13/2023 after sustaining a CVA while on a trip to the beach. Pt. Was diagnosed with R MCA CVA. Pt. Was admitted to inpatient rehab from 07/19/2023-08/13/2023. Past medical history includes high BP, anemia, and Myasthenia Gravis.   PRECAUTIONS: None  WEIGHT BEARING RESTRICTIONS: No  PAIN: 02/11/24: No pain  FALLS: Has patient fallen in last 6 months? Yes  LIVING ENVIRONMENT: Lives with: lives with their family and lives with their spouse-  Lives in: House/apartment- 2 story home and resides mostly on the 1st floor  Stairs: No- Ramped entrance Has following equipment at home:  Vannie, cane, Steadi, shower chair, handheld shower head, bedside commode  PLOF: Independent and Independent with basic ADLs Independent at home   PATIENT GOALS: Would like to walk 10-20 feet each time. 01/16/24: Update: pt has restarted PT visits and will be continuing to focus on mobility in those sessions.  Pt continues to work towards improving functional use of the LUE during OT visits.   OBJECTIVE:  Note: Objective measures were completed at Evaluation unless otherwise  noted.  HAND DOMINANCE: Right  ADLs: Overall ADLs:  Transfers/ambulation related to ADLs: Eating: Independent, 100% R handed Grooming: independent UB Dressing: Can put on shirt independently LB Dressing: Difficulty with pants and requires assistance getting the pants on his feet, has difficulty putting on shoes and socks Toileting: Tot A for toilet hygiene. Uses Steadi during toilet transfers. Bathing: Requires assist with using the L arm to wash the R arm. Tub Shower transfers: Roll in shower, built in shower chair, grab bars and hand held shower head.  IADLs: Shopping: Total A (Wife typically did the shopping prior to onset)   Light housekeeping: Total A (wife typically performs prior to onset) Meal Prep: Independent with light snack prep Community mobility: No driving, able to get in/out of the car Medication management: Set up assistance from his wife using a pill box (pt. Is responsible for taking medications at the correct time) Financial management: family members check behind him. Handwriting: TBD Hobbies: gardening, wood working and sports- [employer] Work history: Owns a tax business  MOBILITY STATUS: Needs Assist:    POSTURE COMMENTS:   Sitting balance: Good supported sitting balance  FUNCTIONAL OUTCOME MEASURES:  Fugl-Meyer: see below  UPPER EXTREMITY ROM:    Active ROM Right Eval  [facility] Right 09/23/2023 [facility] Left eval Left 09/23/2023 Left 10/29/23 Left 12/02/23 Left 12/31/23 Left 02/11/24  Shoulder flexion   89(110) 109(114) 111(115) 90 before scaption (110) 95 before scaption (110) 100 before scaption (110)  Shoulder abduction   98(120) 109(120) 111(122) 100 (110) 105(118) 107(121)  Shoulder adduction          Shoulder extension          Shoulder internal rotation          Shoulder external rotation          Elbow flexion   120(140) 130(136) 141(145)  146 146  Elbow extension   -28(-10) -5(0) -18(0)  -3(0) 0  Wrist flexion   60(60) Rest at 60 50(65) 54(60)      Wrist extension   -48(42) 10(40) 20(50) 30 (45) 30(45) 45(54)  Wrist ulnar deviation     12(22)     Wrist radial deviation     10(20)     Wrist pronation     (82)90   85  Wrist supination     64(74) 70 (75)  34(74)  (Blank rows = not tested)  Eval: L Digit flexion: 2nd:3cm (0cm) 3rd: 0cm (0cm), 4th: 0cm (0cm), 5th: 2cm (0cm)  09/23/23:  L Digit flexion to [facility]: 2nd: 3.5cm (0cm), 3rd: 4 cm(0cm), 4th 3.5cm (0cm), 5th: 2cm (0cm)  Eval:  L Digit extension: Active Gross digit extension through 10% of ROM  09/23/23:  L Digit extension: Active Gross digit extension through 50% of ROM  10/29/23:  L Digit extension: Active Gross digit extension through 25-50% of ROM  12/02/23:  L digit extension: Active gross ext through 30%   12/31/23:  L digit extension:  Active gross  ext through ~30-40%   02/11/24:  Left full composite fist  L digit extension:  Active gross ext through 60% range   UPPER EXTREMITY MMT:     MMT Right eval Left eval Left 09/23/2023 Left 10/29/23 Left 12/02/23 Left 12/31/23 Left 02/11/24  Shoulder flexion 5 3- 3-/5 3-/5  3-/5 3-/5  Shoulder abduction 5 3- 3-/5 3-/5  3-5 3-/5  Shoulder adduction         Shoulder extension         Shoulder internal rotation         Shoulder external rotation         Middle trapezius         Lower trapezius         Elbow flexion 5 TBD 3+/5 4-/5 4+/5 4+/5 4+/5  Elbow extension 5 TBD 4-/5 3-/5 4+/5 4+/5 4+/5  Wrist flexion   2/5 3-/5 3-    Wrist extension  2- 2/5 2+/5 3- 3-/5 3-/5  Wrist ulnar deviation         Wrist radial deviation         Wrist pronation    3/5 3+  3+/5  Wrist supination    3-/5 3-  3-/5  (Blank rows = not tested)  HAND FUNCTION:  Eval:  Grip strength: Right: 94#, Left: 1#,  Lateral Key Pinch strength: Right: 14#, Left: 3#  10/29/23:  Grip strength: Left: 16# Lateral Key Pinch strength: Left: 5#  12/02/23: L: 5 lbs, R: 98 # Lateral pinch: Left: 5 lbs   12/31/23:   Grip strength:  Right: 105#, Left: 14#,   02/11/24:   Grip strength: Left: 22#  COORDINATION: Box and Blocks: Right: 34 blocks completed in 1 min., Left: 0 blocks completed in 1 min.   10/29/23:  Box and Blocks: Right: 34 blocks completed in 1 min., Left: 2 blocks completed in 1 min.  9 Hole Peg Test: Left: To remove 9 pegs from a vertical position: 1 min. &13 sec.  12/02/23: Box and Blocks: 4 blocks 12/02/23: 4 pegs out in 3 min (multiple pulled and dropped out of the cup)   12/02/23: Box and Blocks Test: 5 blocks in 1 min.  02/10/24: Box and Blocks Test: 5 blocks in 1 min.  02/11/24: 1 min. & 18 sec. to remove 9 pegs.  FUGL-MEYER ASSESSMENT   A. Upper Extremity   I. Reflex Activity:   12/12/23   Flexors (biceps) 2   Extensors (triceps) 2   Total 4/4                 II. Volitional Movement within Synergies:   12/12/23   Flexor Synergy:      Shoulder Elevation 2   Shoulder Retraction 2   Shoulder Abduction (at least 90*) 2   External Rotation 1   Elbow Flexion 2   Forearm Supination 1   Extensor Synergy:      Shoulder Adduction/Internal Rotation 2   Elbow Extension 1   Forearm Pronation 2   Total 15/18     III. Movement Combining Synergies:   12/12/23   Hand to Lumbar Spine 1   Shoulder Flexion at 90*, elbow at 0* 0   Pron/Sup w/elbow at 90* and shoulder at 0* 1   Total 2/6     IV. Movement Out of Synergy   12/12/23   Shoulder Abduction to 90*, elbow at 0*, forearm pronated 0   Shoulder Flexion 9)-180*, elbow at 0*, forearm in mid position 0  Pron/sup, elbow at 0* and shoulder between 30-90* of flexion 0   Total 0/6       B. Wrist   12/12/23   Stability of wrist at 15* ext, elbow at 90*, shoulder 0* 2   Repeated flex/ext, elbow at 90*, shoulder 0* 1   Stability of wrist at 15* ext, elbow at 0*, shoulder 30* 0   Flex/ext, elbow 0*, shoulder 30* 0   Circumduction  1   Total 4/10     C. Hand   12/12/23   Mass Flexion 1   Mass Extension 1   Total 2/4       D. Grasp    12/12/23   Hook  0   Thumb Adduction - paper 1   Pincer - pen 0   Cylindrical - cup/can 0   Spherical - tennis ball 1   Total 2/10     E. Coordination/Speed   12/12/23   Tremor 2   Dysmetria 0   Time 1   Total 3/6     FUGL-MEYER ASSESSMENT: UPPER EXTREMITY A-E TOTAL on (date): 32/66   SENSATION: Sensation feels different in both hands.  L hand Light touch- impaired L hand Proprioception- impaired  EDEMA: Has had hx of edema, and came to Eval session with swelling in the L hand/wrist/forearm.   *pt. has a laceration from his dog on the dorsal aspect of the L hand.   MUSCLE TONE: Increased tone through the UE and hand flexors.   COGNITION: Overall cognitive status: Within functional limits for tasks assessed  VISION: Subjective report: L sided inattention Baseline vision: Wears glasses for reading only Visual history: Right visual occlusion  VISION ASSESSMENT: To be assessed  PERCEPTION: TBD  PRAXIS: Impaired                                                                                                                 TREATMENT DATE: 02/11/2024    Therapeutic Exercise:   -AROM/AAROM/PROM for the LUE including left shoulder flexion, abduction, horizontal abduction, elbow flexion, extension, forearm supination, pronation, wrist extension, digit MP/PIP/and DIP extension to increase ROM, and decrease stiffness   Estim attended:   -Tx time: 8 min, 42mA CC, duty cycle 50%, ramp 5 sec, cycle time 5/5; electrodes placed at LUE proximal and distal forearm, dorsal aspect, to promote wrist and digit extension.     Therapeutic Activities:  -Facilitated diagonal patterns of movement with the LUE in  preparation for self-grooming tasks. -Facilitated left hand [facility] skills grasping 1/2 cubes from the therapist's hand, and worked on stacking them vertically on the tabletop surface.  -Facilitated left digit extension to work towards controlled releasing of the 1/2 objects  from his left hand.   Education: Education details:  [facility] skills, digit extension Person educated: Patient and spouse Education method: Explanation, Demonstration, Tactile cues, Verbal cues, and Handouts Education comprehension: verbalized understanding, further training needed  HOME EXERCISE PROGRAM: -grasping and stacking 1, and 1/2 cubes form the tabletop surface; yellow theraband for LUE strengthening  GOALS: Goals reviewed with patient? Yes  SHORT TERM GOALS: Target date: 02/11/2024   Pt. Will be independent with HEP for the LUE.  Baseline: 12/31/23: Independent; ongoing 12/03/23: indep with current HEP, though ongoing progressions are being made as UE function improves; 10/29/23: Pt. requires assist from wife with HEPs. Pt. Is consistently working on HEP's at home. 09/19/23: Requires assist from his wife Eval; no current HEP Goal status: Achieved,Ongoing  LONG TERM GOALS: Target date: 03/24/2024  Pt. Will increase L shoulder flexion by 10 degrees to be able to reach up to shelves.  Baseline: 12/31/23: Left: 95 degrees before scaption, 110 of scaption 12/03/23: 90* before onset of scaption; 10/29/23: 111(115) 09/19/23: Pt. Continues to be limited with reaching up to shelves. Eval: L shoulder flexion is 89 (110). Goal status: progressing, Ongoing  2.  Pt. Will increase L shoulder abduction by 10 degrees to assist with underarm hygiene.  Baseline: 12/31/23: Left: 105(118) 12/03/23: 100*; pt able to demo bilat underarm hygiene with set up; 10/29/23: 888(877) 09/19/23: Pt. Continues to require assist with underarm hygiene.Eval: L shoulder abduction is 98 (120).  Goal status: Progressing, ongoing  3.  Pt. Will improve L wrist extension by 10 degrees to be able to initiate anticipation of grasping for objects.  Baseline: 12/31/23: 30(45) 12/03/23: 30* (45); 10/29/23: 20(55) 09/19/23: Consistent activation noted in the left wrist extensors with facilitation. Eval: -48 (42) Goal status: Ongoing  4.   Pt. Will improve active gross digit extension through 50% of the range to be able to release objects in his hand consistently.  Baseline:12/31/23: Gross digit extension: ~30-40% 12/03/23: Gross digit ext through ~30% of range (limited by flexor tone in the PIPs); 10/28/20: Gross digit extension through 25-50% of the range. 09/19/23: gross digit extension noted through 25% range with facilitation.Eval: gross digit extension is 10% of the range  Goal status: Ongoing  5.  Pt. Will independently demonstrate visual compensatory strategies when navigating through environment and during tabletop tasks. Baseline: 12/03/23: Pt demonstrates consistent head turns to scan to L visual field without cueing; 10/29/2023: Pt. Continues to less cuing for left sided awareness. 09/19/23: Pt. requires fewer cues for left sided awareness Eval: Pt. Requires consistent cuing for L sided awareness.  Goal status: achieved   6. Pt. Will improve left hand [facility] skills to be able to independently manipulate small objects during ADLs, and IADLs. Baseline: 12/31/23: Pt. Is progressing to be able to grasp progressively smaller objects. 12/03/23: Pt removed 3 pegs in >3 min, pulling out more than 3 but repeatedly dropping to floor or table top rather than assessment dish; 10/29/23: 9 Hole Peg Test: Pt. Was able to remove 9 pegs from vertical position on the pegboard in 1 min. & 13 sec. Pt. Is not yet able to pick up pegs from a horizontal position to place them in the pegboard.   Goal status: ongoing   7. Pt will improve left hand function skills as evidenced by improved score to 4 blocks on the Box and Blocks test. Baseline: 12/31/23: 4 blocks in 1 min. 12/03/23: 4 blocks after 3 trials and passive wrist and digit stretching between trials (not yet consistent to achieve 4 blocks); 10/29/23: Box and Blocks Test: 2 Blocks completed in 1 min.   Goal Status: ongoing   ASSESSMENT:  CLINICAL IMPRESSION:   Pt. Continues to require hand over hand  assist for all hand to face patterns of movement for self-grooming tasks. Pt. continues to tolerate EStim well to the left wrist,  and digit extensors. Pt.  was able to stack 2 1/2 cubes vertically on the tabletop surface with cuing for digit extension for controlled release of the objects from his left hand. Pt. continues to benefit from OT services to work on improving LUE functioning to increase engagement of the LUE during daily tasks, and to provide education about compensatory strategies during ADLs/IADLs.   PERFORMANCE DEFICITS: in functional skills including ADLs, IADLs, coordination, dexterity, proprioception, sensation, edema, tone, ROM, strength, pain, Fine motor control, Gross motor control, mobility, balance, endurance, vision, and UE functional use, cognitive skills including perception, and psychosocial skills including coping strategies, environmental adaptation, and routines and behaviors.   IMPAIRMENTS: are limiting patient from ADLs, IADLs, and leisure.   CO-MORBIDITIES: may have co-morbidities  that affects occupational performance. Patient will benefit from skilled OT to address above impairments and improve overall function.  MODIFICATION OR ASSISTANCE TO COMPLETE EVALUATION: Min-Moderate modification of tasks or assist with assess necessary to complete an evaluation.  OT OCCUPATIONAL PROFILE AND HISTORY: Detailed assessment: Review of records and additional review of physical, cognitive, psychosocial history related to current functional performance.  CLINICAL DECISION MAKING: Moderate - several treatment options, min-mod task modification necessary  REHAB POTENTIAL: Good  EVALUATION COMPLEXITY: Moderate  PLAN:  OT FREQUENCY: 2x/week  OT DURATION: 12 weeks  PLANNED INTERVENTIONS: 97168 OT Re-evaluation, 97535 self care/ADL training, 02889 therapeutic exercise, 97530 therapeutic activity, 97112 neuromuscular re-education, 97140 manual therapy, 97018 paraffin, 02989 moist  heat, 97010 cryotherapy, 97034 contrast bath, 97760 Orthotic Initial, 97763 Orthotic/Prosthetic subsequent, passive range of motion, visual/perceptual remediation/compensation, energy conservation, and DME and/or AE instructions  RECOMMENDED OTHER SERVICES: PT and ST  CONSULTED AND AGREED WITH PLAN OF CARE: Patient and family member/caregiver  PLAN FOR NEXT SESSION: see above  Richardson Otter, MS, OTR/L   02/11/24, 2:11 PM

## 2024-02-13 ENCOUNTER — Ambulatory Visit: Admitting: Occupational Therapy

## 2024-02-13 ENCOUNTER — Ambulatory Visit

## 2024-02-13 ENCOUNTER — Encounter: Admitting: Physician Assistant

## 2024-02-13 DIAGNOSIS — R278 Other lack of coordination: Secondary | ICD-10-CM

## 2024-02-13 DIAGNOSIS — R262 Difficulty in walking, not elsewhere classified: Secondary | ICD-10-CM

## 2024-02-13 DIAGNOSIS — I69354 Hemiplegia and hemiparesis following cerebral infarction affecting left non-dominant side: Secondary | ICD-10-CM

## 2024-02-13 DIAGNOSIS — M6281 Muscle weakness (generalized): Secondary | ICD-10-CM | POA: Diagnosis not present

## 2024-02-13 DIAGNOSIS — R2681 Unsteadiness on feet: Secondary | ICD-10-CM

## 2024-02-13 DIAGNOSIS — R269 Unspecified abnormalities of gait and mobility: Secondary | ICD-10-CM

## 2024-02-13 NOTE — Therapy (Signed)
 OUTPATIENT PHYSICAL THERAPY TREATMENT/PHYSICAL THERAPY PROGRESS NOTE   Dates of reporting period  12/31/23   to   02/13/24   Patient Name: [name]. MRN: [id number] DOB:03-27-1950, 73 y.o., male Today's Date: 02/13/2024 PCP: Dennise Remak, MD  REFERRING PROVIDER:  Pegge Toribio PARAS, PA-C  END OF SESSION:   PT End of Session - 02/13/24 1323     Visit Number 50    Number of Visits 64    Date for Recertification  03/24/24    Authorization Type U[employer]    Authorization Time Period [facility] auth#; [id number] A for 16 PT vst from 10/09-01/02/2025    Authorization - Number of Visits 16    Progress Note Due on Visit 50    PT Start Time 1320    PT Stop Time 1400    PT Time Calculation (min) 40 min    Equipment Utilized During Treatment Gait belt    Activity Tolerance Patient tolerated treatment well;No increased pain    Behavior During Therapy WFL for tasks assessed/performed           Past Medical History:  Diagnosis Date   Anemia    Cervical myelopathy (HCC)    Chronic venous insufficiency    [name] (chronic kidney disease), stage III (HCC)    Diabetes mellitus without complication (HCC)    GERD (gastroesophageal reflux disease)    Hypercholesteremia    Hypothyroidism    Kidney stones    about 50 in the past--last in March 4/24   Leg weakness, bilateral    due to Myasthenia Gravis   Myasthenia gravis (HCC)    Spinal stenosis    Vitamin B 12 deficiency    Vitamin B 12 deficiency    Wears hearing aid    bilateral   Past Surgical History:  Procedure Laterality Date   BACK SURGERY  09/2015   CARDIAC CATHETERIZATION     CATARACT EXTRACTION W/ INTRAOCULAR LENS IMPLANT     CATARACT EXTRACTION W/PHACO Right 12/21/2015   Procedure: CATARACT EXTRACTION PHACO AND INTRAOCULAR LENS PLACEMENT (IOC);  Surgeon: Dene Etienne, MD;  Location: [facility];  Service: Ophthalmology;  Laterality: Right;  DIABETIC - insulin  and oral meds   COLONOSCOPY     SHOULDER  SURGERY     labrum repair   TONSILLECTOMY     Patient Active Problem List   Diagnosis Date Noted   Acquired left foot drop 01/10/2024   Left spastic hemiplegia (HCC) 01/10/2024   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) 10/11/2023   High blood pressure 08/13/2023   Anemia 08/13/2023   Constipation 08/13/2023   Myasthenia gravis (HCC) 08/13/2023   Acute ischemic right MCA stroke (HCC) 07/19/2023   Lymphedema 12/27/2016   Leg pain 07/15/2016   Chronic venous insufficiency 07/15/2016   Swelling of limb 07/15/2016   Type 2 diabetes mellitus (HCC) 07/15/2016   Hyperlipidemia 07/15/2016    ONSET DATE: 07/19/23  REFERRING DIAG:  P36.488 (ICD-10-CM) - Cerebral infarction due to unspecified occlusion or stenosis of right middle cerebral artery   THERAPY DIAG:  Muscle weakness (generalized)  Other lack of coordination  Hemiplegia and hemiparesis following cerebral infarction affecting left non-dominant side (HCC)  Difficulty in walking, not elsewhere classified  Abnormality of gait and mobility  Unsteadiness on feet  Rationale for Evaluation and Treatment: Rehabilitation  SUBJECTIVE:  SUBJECTIVE STATEMENT:   Pt denies any pain, but jokingly states he might have a little bit after working with PT.     From session on 01/14/2024: Patient states the MD suggested/recommended patient get an AFO for his L LE and patient has an appointment with [facility] the 2nd week of November for an assessment.     PERTINENT HISTORY:  CVA with Left hemiplegia in April 2025. Pt started on a swedish knee cage at CIR due to uncontrolled hyperextension in stance. Pt had not been moving much since discharging from CIR, as therapists instructed pt to perform transfers  only at home due to high fall risk with ambulation. Continues to have significant weakenss in the LUE with difficulty extending fingers as well as numbness and inattention to the L side of body resulting in multiple cuts on Arm and leg with WC mobility in home. Will be attaining custom power WC from [employer]. Has loaner currently.   PAIN:  Are you having pain? No  PRECAUTIONS: Fall, history of wounds on LLE   *Latex allergy   WEIGHT BEARING RESTRICTIONS: No  FALLS: Has patient fallen in last 6 months? Yes. Number of falls 1  LIVING ENVIRONMENT: Lives with: lives with their spouse Lives in: House/apartment Stairs: Ramps installed while in the Hospital  Has following equipment at home: Vannie - 2 wheeled and Wheelchair (power)  PLOF: Independent with basic ADLs, Independent with household mobility without device, and Independent with community mobility with device with [facility]   PATIENT GOALS: relearn to Walk 15-92ft.   OBJECTIVE:                                                                                                                              TREATMENT DATE: 02/13/2024  Pt arrives in transport chair.   Pt agreeable to continue participating in higher intensity gait training using litegait overground to address more balance deficits. Donned L swedish knee cage for gait.  Transfer from transport chair to green chair using bari-RW and light min A of 1 for balance.  Donned litegait harness.   Gait training overground; the following trials completed in litegait harness for safety, but not providing BWS (body weight support), with therapist assist for driving litegait.  Pt able to ambulate around the gym, down EVS hallway, past cafeteria, down main hallway to rehab area, and on the rehab hallway, pt performed ambulation without the R UE on the LiteGait. Total distance accumulated is ~500' at one time.  Pt did not take any standing rest breaks, throughout the attempt.  Pt with no  instances of instability and performed with slightly greater speed.  Pt was able to ambulate a longer distance without the use of the R UE for ~120'.   When fatigued, pt tends to have more difficulty advancing the L LE.  Of note, pt completing above gait training without seated or standing rest break between.   Pt will benefit from continued  participation in overground gait training with use of litegait for balance safety to further train stepping strategies to maintain balance without reliance on R UE support.  Doffed litegait harness.   Exited session in transport chair and in the care of his wife for transport to OT appointment.    PATIENT EDUCATION: Education details: knee cage needs assessmnet as it is causeing crossover gait.  Person educated: Patient and Spouse Education method: Explanation, VC/TC/Demo Education comprehension: verbalized understanding  HOME EXERCISE PROGRAM: Access Code: [email or web address] URL: [email or web address] Date: 01/14/2024 Prepared by: Connell Kiss  Exercises - Seated Knee Extension AROM  - 1 x daily - 4 x weekly - 3 sets - 10 reps - Seated Hip Abduction  - 1 x daily - 4 x weekly - 3 sets - 10 reps - Seated March  - 1 x daily - 4 x weekly - 3 sets - 10 reps - Sit to Stand with Counter Support  - 1 x daily - 4 x weekly - 3 sets - 10 reps - Supine Short Arc Quad  - 1 x daily - 4 x weekly - 3 sets - 10 reps - Supine Hip Abduction  - 1 x daily - 4 x weekly - 3 sets - 10 reps - Supine Bridge  - 1 x daily - 4 x weekly - 3 sets - 3 reps - 2sec  hold - Seated Hamstring Stretch with Strap  - 2-3 x daily - 7 x weekly - 2 sets - 30 second to 1 minute hold - Seated Knee Flexion Slide  - 2-3 x daily - 7 x weekly - 2 sets - 30 seconds to 1 minute hold  12/17/2023: verbal instruction to complete repeated sit<>stands with education on safe set-up provided  GOALS: Goals reviewed with patient? Yes  SHORT TERM GOALS: Target date: 02/11/2024  Patient will be  independent in home exercise program to improve strength/mobility for better functional independence with ADLs. Baseline: Initiated on 09/11/2023 10/22/2023: will update when appropriate 11/26/2023: patient participating in transfer and gait practice in controlled environment with 2 person assistance at home in addition to HEP 12/31/2023: patient participating in gait using bari-RW at home with family assistance Goal status: IN PROGRESS  LONG TERM GOALS: Target date: 03/24/2024  Patient will increase SIS-16 score to equal to or greater than 10points to demonstrate statistically significant improvement in mobility and quality of life.  Baseline: 49/80 09/19/2023: 45/80 10/22/2023: 46/80 11/26/2023: 47/80 12/31/2023: 53/80 Goal status: IN PROGRESS  2.  Patient (> 80 years old) will complete five times sit to stand test in < 15 seconds indicating an increased LE strength and improved balance. Baseline: 1:78min 09/19/2023: 56.39 seconds using R UE support to push-up from armrest of green chair and requiring skilled min A for balance and lifting/lowering  10/22/2023: 27.10 seconds using R UE support to push-up from armrest  8/12: 27.7 sec with RUE pushing from arm rest.  11/26/2023: 37.42 seconds using R UE support to push-up from armrest of green chair and not using L UE support with min-modA for facilitating anterior weight shift and lifting to come to stand secondary to fatigue (would benefit from re-testing at next session) 12/17/2023: 24.59 seconds using R UE support to push-up from armrest of green chair and not using L UE support with CGA/light min A for safety/steadying  Goal status: IN PROGRESS  3.  Patient will increase FIST score by > 6 points to demonstrate decreased fall risk during functional activities Baseline:  43/56  09/23/2023:  53/56 Goal status: MET and UPGRADED  09/23/2023: Patient will increase Berg Balance score to > 45/56 to demonstrate improved balance and decreased fall risk during  functional activities and ADLs.  Baseline: 09/23/2023: 14/56 7/31: 20/56 using RW support 11/26/2023: 17/56 without AD support 12/31/2023: 21/56 without AD support Goal status: IN PROGRESS  4.  Patient will increase 10 meter walk test to >0.71m/s as to improve gait speed for better community ambulation and to reduce fall risk. Baseline: 09/09/2023: 0.103 m/s ( and 37 seconds) using bari-RW with skilled heavy min A and +2 w/c follow for safety  09/19/2023: 0.65m/s using bari-RW with skilled min A of 1 and +2 w/c follow for safety 10/22/2023: 0.1875 m/s using bari-RW with L hand splint, L swedish knee cage, and only skilled light min A of 1 (no wheelchair follow)  8/12: 0.164m/s with RW, and L hand splint. No knee cage on this day.  11/26/2023: Average Normal speed: 0.20 m/s & Average Fast speed: 0.275 m/s using bari-RW with CGA/min A for balance wearing [facility] 12/17/2023: Average Normal speed: 0.32 m/s using bari-RW with CGA & Average Fast speed: 0.36 m/s using bari-RW requiring CGA/light min A  12/31/2023: Average Normal speed: 0.3275 m/s using bari-RW with CGA/light min A for steadying Goal status: IN PROGRESS  5.  Patient will reduce timed up and go to <11 seconds to reduce fall risk and demonstrate improved transfer/gait ability. Baseline: unable to complete turn at 10 ft. Mod-max assist due to poor control of LW on RW, resulting in L lateral LOB  09/09/2023: and 32 seconds using bari-RW with L hand splint and +2 on R side for safety, therapist providing skilled min A on L side (Had pt set-up L hand on RW splint, get L foot in proper positioning, and scoot forward in seat before starting the timer) 09/19/2023: 74min43 seconds using bari-RW with only skilled min A of 1 (Had pt set-up L hand on RW splint, get L foot in proper positioning, and scoot forward in seat before starting the timer) 7/2: 78sec(1:18.125) with RW and min assist overall.  10/22/2023: 1 min, 30 seconds using bari-RW with L hand  splint and skilled light min assist  8/12: 1:01 min with RW and L hand splint.  11/26/2023: 51.57 seconds using bari-RW with hand splint, wearing SKC, and CGA-minA for balance 12/31/2023: 43.88 seconds using bari-RW with hand splint, wearing SKC, and CGA/occasional light min A   Goal status: IN PROGRESS   ASSESSMENT:  CLINICAL IMPRESSION:   Pt continues to respond well to gait training with use of the LiteGait.  Goal assessment will be performed at a later date due to pt wanting to ambulate again with the use of the LiteGait this week and not having enough time to perform at the conclusion of that gait training.  Pt will be re-assessed for goals at a later date/time.  Patient's condition has the potential to improve in response to therapy. Maximum improvement is yet to be obtained. The anticipated improvement is attainable and reasonable in a generally predictable time.  Pt will continue to benefit from skilled therapy to address remaining deficits in order to improve overall QoL and return to PLOF.         OBJECTIVE IMPAIRMENTS: Abnormal gait, cardiopulmonary status limiting activity, decreased activity tolerance, decreased balance, decreased cognition, decreased coordination, decreased endurance, decreased knowledge of condition, decreased knowledge of use of DME, decreased mobility, difficulty walking, decreased ROM, decreased strength, decreased safety awareness, dizziness, hypomobility, increased fascial restrictions,  impaired perceived functional ability, increased muscle spasms, impaired sensation, impaired tone, impaired UE functional use, impaired vision/preception, improper body mechanics, postural dysfunction, and obesity.   ACTIVITY LIMITATIONS: carrying, lifting, bending, sitting, standing, squatting, stairs, transfers, bed mobility, bathing, toileting, dressing, reach over head, hygiene/grooming, locomotion level, and caring for others  PARTICIPATION LIMITATIONS: meal prep, cleaning,  laundry, medication management, interpersonal relationship, driving, shopping, community activity, and yard work  PERSONAL FACTORS: Age, Past/current experiences, Time since onset of injury/illness/exacerbation, and 3+ comorbidities: MG, HTN, CVA, lymphedema are also affecting patient's functional outcome.   REHAB POTENTIAL: Good  CLINICAL DECISION MAKING: Unstable/unpredictable  EVALUATION COMPLEXITY: High  PLAN:  PT FREQUENCY: 1-2x/week  PT DURATION: 8 weeks  PLANNED INTERVENTIONS: 97164- PT Re-evaluation, 97750- Physical Performance Testing, 97110-Therapeutic exercises, 97530- Therapeutic activity, [id number]- Neuromuscular re-education, 97535- Self Care, [id number]- Manual therapy, [id number]- Gait training, [phone]- Orthotic Initial, [phone]- Orthotic/Prosthetic subsequent, [phone]- Canalith repositioning, [id number]- Electrical stimulation (unattended), [phone]- Electrical stimulation (manual), [id number]- Wound care (first 20 sq cm), 97598- Wound care (each additional 20 sq cm), Patient/Family education, Balance training, Stair training, Taping, Dry Needling, Joint mobilization, Joint manipulation, Vestibular training, Visual/preceptual remediation/compensation, Cognitive remediation, DME instructions, Wheelchair mobility training, Cryotherapy, and Moist heat  PLAN FOR NEXT SESSION:     Add STS to HEP HIGT (high intensity gait training) using litegait harness either on treadmill vs overground Continue overground gait training in litegait harness at next visit - decreasing reliance on R UE support Continue stair navigation training Dynamic gait training using bari-RW vs litegait Backwards Side stepping Obstacle navigation (turning) Working on forward propulsion with increased L hip extension and forward progression of pelvis over L stance phase Dynamic standing balance of trunk rotation Continue monitoring SKC fit & see if posterior knee strap needs to be adjusted    Fonda Simpers, PT, DPT Physical  Therapist - [facility]  02/13/24, 2:18 PM

## 2024-02-13 NOTE — Therapy (Addendum)
 Occupational Therapy Neuro Treatment Note  Patient Name: [name]. MRN: [id number] DOB:[birth year], 73 y.o., male Today's Date: 02/13/2024  PCP: Alm Rower, MD REFERRING PROVIDER: Hermann Toribio PARAS, PA-C  END OF SESSION:  OT End of Session - 02/13/24 2241     Visit Number 51    Number of Visits 72    Date for Recertification  03/24/24    OT Start Time 1400    OT Stop Time 1445    OT Time Calculation (min) 45 min    Activity Tolerance Patient tolerated treatment well    Behavior During Therapy [facility] for tasks assessed/performed           Past Medical History:  Diagnosis Date   Anemia    Cervical myelopathy (HCC)    Chronic venous insufficiency    [name] (chronic kidney disease), stage III (HCC)    Diabetes mellitus without complication (HCC)    GERD (gastroesophageal reflux disease)    Hypercholesteremia    Hypothyroidism    Kidney stones    about 50 in the past--last in March 4/24   Leg weakness, bilateral    due to Myasthenia Gravis   Myasthenia gravis (HCC)    Spinal stenosis    Vitamin B 12 deficiency    Vitamin B 12 deficiency    Wears hearing aid    bilateral   Past Surgical History:  Procedure Laterality Date   BACK SURGERY  09/2015   CARDIAC CATHETERIZATION     CATARACT EXTRACTION W/ INTRAOCULAR LENS IMPLANT     CATARACT EXTRACTION W/PHACO Right 12/21/2015   Procedure: CATARACT EXTRACTION PHACO AND INTRAOCULAR LENS PLACEMENT (IOC);  Surgeon: Dene Etienne, MD;  Location: [facility];  Service: Ophthalmology;  Laterality: Right;  DIABETIC - insulin  and oral meds   COLONOSCOPY     SHOULDER SURGERY     labrum repair   TONSILLECTOMY     Patient Active Problem List   Diagnosis Date Noted   Acquired left foot drop 01/10/2024   Left spastic hemiplegia (HCC) 01/10/2024   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) 10/11/2023   High blood pressure 08/13/2023   Anemia 08/13/2023   Constipation  08/13/2023   Myasthenia gravis (HCC) 08/13/2023   Acute ischemic right MCA stroke (HCC) 07/19/2023   Lymphedema 12/27/2016   Leg pain 07/15/2016   Chronic venous insufficiency 07/15/2016   Swelling of limb 07/15/2016   Type 2 diabetes mellitus (HCC) 07/15/2016   Hyperlipidemia 07/15/2016   ONSET DATE: 07/13/2023  REFERRING DIAG: Acute ischemic R MCA CVA  THERAPY DIAG: muscle weakness (generalized), other lack of coordination, vision disturbance, hemiplegia and hemiparesis following cerebral infarction affecting left non-dominant side (HCC)  Rationale for Evaluation and Treatment: Rehabilitation  SUBJECTIVE:  SUBJECTIVE STATEMENT:  Pt. Reports that he is having trouble finding the recommended physician in order to schedule a consult for [employer] with the physician at [facility]. An email was sent to the [employer] Rep. Pt accompanied by: [name]  PERTINENT HISTORY: Pt. Was admitted to [facility] on 07/13/2023 after sustaining a CVA while on a trip to the beach. Pt. Was diagnosed with R MCA CVA. Pt. Was admitted to inpatient rehab from 07/19/2023-08/13/2023. Past medical history includes high BP, anemia, and Myasthenia Gravis.   PRECAUTIONS: None  WEIGHT BEARING RESTRICTIONS: No  PAIN: 02/13/24: No pain  FALLS: Has patient fallen in last 6 months? Yes  LIVING ENVIRONMENT: Lives with: lives with their family and lives with their spouse-  Lives in: House/apartment- 2 story home and resides mostly on the 1st floor Stairs: No- Ramped entrance Has following equipment at home:  Vannie, cane, Steadi, shower chair, handheld shower head, bedside commode  PLOF: Independent and Independent with basic ADLs Independent at home   PATIENT GOALS: Would like to walk 10-20 feet each time. 01/16/24: Update: pt has restarted PT visits and will be continuing to focus on mobility in those sessions.  Pt continues to work towards improving functional use of the LUE during OT visits.   OBJECTIVE:   Note: Objective measures were completed at Evaluation unless otherwise noted.  HAND DOMINANCE: Right  ADLs: Overall ADLs:  Transfers/ambulation related to ADLs: Eating: Independent, 100% R handed Grooming: independent UB Dressing: Can put on shirt independently LB Dressing: Difficulty with pants and requires assistance getting the pants on his feet, has difficulty putting on shoes and socks Toileting: Tot A for toilet hygiene. Uses Steadi during toilet transfers. Bathing: Requires assist with using the L arm to wash the R arm. Tub Shower transfers: Roll in shower, built in shower chair, grab bars and hand held shower head.  IADLs: Shopping: Total A (Wife typically did the shopping prior to onset)   Light housekeeping: Total A (wife typically performs prior to onset) Meal Prep: Independent with light snack prep Community mobility: No driving, able to get in/out of the car Medication management: Set up assistance from his wife using a pill box (pt. Is responsible for taking medications at the correct time) Financial management: family members check behind him. Handwriting: TBD Hobbies: gardening, wood working and sports- [employer] Work history: Owns a tax business  MOBILITY STATUS: Needs Assist:    POSTURE COMMENTS:   Sitting balance: Good supported sitting balance  FUNCTIONAL OUTCOME MEASURES:  Fugl-Meyer: see below  UPPER EXTREMITY ROM:    Active ROM Right Eval  [facility] Right 09/23/2023 [facility] Left eval Left 09/23/2023 Left 10/29/23 Left 12/02/23 Left 12/31/23 Left 02/11/24  Shoulder flexion   89(110) 109(114) 111(115) 90 before scaption (110) 95 before scaption (110) 100 before scaption (110)  Shoulder abduction   98(120) 109(120) 111(122) 100 (110) 105(118) 107(121)  Shoulder adduction          Shoulder extension          Shoulder internal rotation          Shoulder external rotation          Elbow flexion   120(140) 130(136) 141(145)  146 146  Elbow extension   -28(-10)  -5(0) -18(0)  -3(0) 0  Wrist flexion   60(60) Rest at 60 50(65) 54(60)     Wrist extension   -48(42) 10(40) 20(50) 30 (45) 30(45) 45(54)  Wrist ulnar deviation     12(22)     Wrist radial deviation     10(20)     Wrist pronation     (82)90   85  Wrist supination     64(74) 70 (75)  34(74)  (Blank rows = not tested)  Eval: L Digit flexion: 2nd:3cm (0cm) 3rd: 0cm (0cm), 4th: 0cm (0cm), 5th: 2cm (0cm)  09/23/23:  L Digit flexion to [facility]: 2nd: 3.5cm (0cm), 3rd: 4 cm(0cm), 4th 3.5cm (0cm), 5th: 2cm (0cm)  Eval:  L Digit extension: Active Gross digit extension through 10% of ROM  09/23/23:  L Digit extension: Active Gross digit extension through 50% of ROM  10/29/23:  L Digit extension: Active Gross digit extension through 25-50% of ROM  12/02/23:  L digit extension: Active gross  ext through 30%   12/31/23:  L digit extension:  Active gross ext through ~30-40%   02/11/24:  Left full composite fist  L digit extension:  Active gross ext through 60% range   UPPER EXTREMITY MMT:     MMT Right eval Left eval Left 09/23/2023 Left 10/29/23 Left 12/02/23 Left 12/31/23 Left 02/11/24  Shoulder flexion 5 3- 3-/5 3-/5  3-/5 3-/5  Shoulder abduction 5 3- 3-/5 3-/5  3-5 3-/5  Shoulder adduction         Shoulder extension         Shoulder internal rotation         Shoulder external rotation         Middle trapezius         Lower trapezius         Elbow flexion 5 TBD 3+/5 4-/5 4+/5 4+/5 4+/5  Elbow extension 5 TBD 4-/5 3-/5 4+/5 4+/5 4+/5  Wrist flexion   2/5 3-/5 3-    Wrist extension  2- 2/5 2+/5 3- 3-/5 3-/5  Wrist ulnar deviation         Wrist radial deviation         Wrist pronation    3/5 3+  3+/5  Wrist supination    3-/5 3-  3-/5  (Blank rows = not tested)  HAND FUNCTION:  Eval:  Grip strength: Right: 94#, Left: 1#,  Lateral Key Pinch strength: Right: 14#, Left: 3#  10/29/23:  Grip strength: Left: 16# Lateral Key Pinch strength: Left: 5#  12/02/23: L: 5  lbs, R: 98 # Lateral pinch: Left: 5 lbs   12/31/23:   Grip strength: Right: 105#, Left: 14#,   02/11/24:   Grip strength: Left: 22#  COORDINATION: Box and Blocks: Right: 34 blocks completed in 1 min., Left: 0 blocks completed in 1 min.   10/29/23:  Box and Blocks: Right: 34 blocks completed in 1 min., Left: 2 blocks completed in 1 min.  9 Hole Peg Test: Left: To remove 9 pegs from a vertical position: 1 min. &13 sec.  12/02/23: Box and Blocks: 4 blocks 12/02/23: 4 pegs out in 3 min (multiple pulled and dropped out of the cup)   12/02/23: Box and Blocks Test: 5 blocks in 1 min.  02/10/24: Box and Blocks Test: 5 blocks in 1 min.  02/11/24: 1 min. & 18 sec. to remove 9 pegs.  Box and Blocks: 1 block in 1 min.   FUGL-MEYER ASSESSMENT   A. Upper Extremity   I. Reflex Activity:   12/12/23   Flexors (biceps) 2   Extensors (triceps) 2   Total 4/4                 II. Volitional Movement within Synergies:   12/12/23   Flexor Synergy:      Shoulder Elevation 2   Shoulder Retraction 2   Shoulder Abduction (at least 90*) 2   External Rotation 1   Elbow Flexion 2   Forearm Supination 1   Extensor Synergy:      Shoulder Adduction/Internal Rotation 2   Elbow Extension 1   Forearm Pronation 2   Total 15/18     III. Movement Combining Synergies:   12/12/23   Hand to Lumbar Spine 1   Shoulder Flexion at 90*, elbow at 0* 0   Pron/Sup w/elbow at 90* and shoulder at 0* 1   Total 2/6     IV. Movement Out of Synergy   12/12/23   Shoulder  Abduction to 90*, elbow at 0*, forearm pronated 0   Shoulder Flexion 9)-180*, elbow at 0*, forearm in mid position 0   Pron/sup, elbow at 0* and shoulder between 30-90* of flexion 0   Total 0/6       B. Wrist   12/12/23   Stability of wrist at 15* ext, elbow at 90*, shoulder 0* 2   Repeated flex/ext, elbow at 90*, shoulder 0* 1   Stability of wrist at 15* ext, elbow at 0*, shoulder 30* 0   Flex/ext, elbow 0*, shoulder 30* 0   Circumduction  1    Total 4/10     C. Hand   12/12/23   Mass Flexion 1   Mass Extension 1   Total 2/4       D. Grasp   12/12/23   Hook  0   Thumb Adduction - paper 1   Pincer - pen 0   Cylindrical - cup/can 0   Spherical - tennis ball 1   Total 2/10     E. Coordination/Speed   12/12/23   Tremor 2   Dysmetria 0   Time 1   Total 3/6     FUGL-MEYER ASSESSMENT: UPPER EXTREMITY A-E TOTAL on (date): 32/66   SENSATION: Sensation feels different in both hands.  L hand Light touch- impaired L hand Proprioception- impaired  EDEMA: Has had hx of edema, and came to Eval session with swelling in the L hand/wrist/forearm.   *pt. has a laceration from his dog on the dorsal aspect of the L hand.   MUSCLE TONE: Increased tone through the UE and hand flexors.   COGNITION: Overall cognitive status: Within functional limits for tasks assessed  VISION: Subjective report: L sided inattention Baseline vision: Wears glasses for reading only Visual history: Right visual occlusion  VISION ASSESSMENT: To be assessed  PERCEPTION: TBD  PRAXIS: Impaired                                                                                                                 TREATMENT DATE: 02/13/2024  Therapeutic Exercise:   -AROM/AAROM/PROM for the LUE including left shoulder flexion, abduction, horizontal abduction, elbow flexion, extension, forearm supination, pronation, wrist extension, digit MP/PIP/and DIP extension to increase ROM, and decrease stiffness   Therapeutic Activities:   -Facilitated diagonal patterns of movement with the LUE in  preparation for self-grooming tasks. -Facilitated left hand [facility] skills grasping 1 discs from the therapist's hand, worked on sustaining the grasp while reaching up through multiple planes, then worked on stacking them vertically on the tabletop surface.  Patient then attempted to progress the task to 3/4 discs. -Facilitated left digit extension to work towards  controlled releasing of the 1/2 objects from his left hand.   Education: Education details:  [facility] skills, digit extension, grasping 3/4 discs. Person educated: Patient and spouse Education method: Explanation, Demonstration, Tactile cues, Verbal cues, and Handouts Education comprehension: verbalized understanding, further training needed  HOME EXERCISE PROGRAM: -grasping and stacking 1, and 1/2 cubes form  the tabletop surface; yellow theraband for LUE strengthening   GOALS: Goals reviewed with patient? Yes  SHORT TERM GOALS: Target date: 02/11/2024   Pt. Will be independent with HEP for the LUE.  Baseline: 02/11/24: Independent 12/31/23: Independent; ongoing 12/03/23: indep with current HEP, though ongoing progressions are being made as UE function improves; 10/29/23: Pt. requires assist from wife with HEPs. Pt. Is consistently working on HEP's at home. 09/19/23: Requires assist from his wife Eval; no current HEP Goal status: Achieved,Ongoing  LONG TERM GOALS: Target date: 03/24/2024  Pt. Will increase L shoulder flexion by 10 degrees to be able to reach up to shelves. Baseline: 02/11/24: 100 before scaption (110) 12/31/23: Left: 95 degrees before scaption, 110 of scaption 12/03/23: 90* before onset of scaption; 10/29/23: 111(115) 09/19/23: Pt. Continues to be limited with reaching up to shelves. Eval: L shoulder flexion is 89 (110). Goal status: progressing, Ongoing  2.  Pt. Will increase L shoulder abduction by 10 degrees to assist with underarm hygiene.  Baseline: 02/11/24: 892(878) 12/31/23: Left: 894(881) 12/03/23: 100*; pt able to demo bilat underarm hygiene with set up; 10/29/23: 888(877) 09/19/23: Pt. Continues to require assist with underarm hygiene.Eval: L shoulder abduction is 98 (120).  Goal status: Progressing, ongoing  3.  Pt. Will improve L wrist extension by 10 degrees to be able to initiate anticipation of grasping for objects.  Baseline: 02/11/24: 45(54) 12/31/23: 30(45)  12/03/23: 30* (45); 10/29/23: 20(55) 09/19/23: Consistent activation noted in the left wrist extensors with facilitation. Eval: -48 (42) Goal status: Ongoing  4.  Pt. Will improve active gross digit extension through 50% of the range to be able to release objects in his hand consistently.  Baseline:02/11/24: L digit extension: Active gross ext through 60% range 12/31/23: Gross digit extension: ~30-40% 12/03/23: Gross digit ext through ~30% of range (limited by flexor tone in the PIPs); 10/28/20: Gross digit extension through 25-50% of the range. 09/19/23: gross digit extension noted through 25% range with facilitation.Eval: gross digit extension is 10% of the range  Goal status: Ongoing  5.  Pt. Will independently demonstrate visual compensatory strategies when navigating through environment and during tabletop tasks. Baseline: 12/03/23: Pt demonstrates consistent head turns to scan to L visual field without cueing; 10/29/2023: Pt. Continues to less cuing for left sided awareness. 09/19/23: Pt. requires fewer cues for left sided awareness Eval: Pt. Requires consistent cuing for L sided awareness.  Goal status: achieved   6. Pt. Will improve left hand [facility] skills to be able to independently manipulate small objects during ADLs, and IADLs. Baseline: 02/11/24: 9 Hole Peg Test:1 min. & 18 sec. to remove 9 pegs.12/31/23: Pt. Is progressing to be able to grasp progressively smaller objects. 12/03/23: Pt removed 3 pegs in >3 min, pulling out more than 3 but repeatedly dropping to floor or table top rather than assessment dish; 10/29/23: 9 Hole Peg Test: Pt. Was able to remove 9 pegs from vertical position on the pegboard in 1 min. & 13 sec. Pt. Is not yet able to pick up pegs from a horizontal position to place them in the pegboard.   Goal status: ongoing   7. Pt will improve left hand function skills as evidenced by improved score to 4 blocks on the Box and Blocks test. Baseline: 02/11/24 Box and Blocks: 1 block in 1 min.  12/31/23: 4 blocks in 1 min. 12/03/23: 4 blocks after 3 trials and passive wrist and digit stretching between trials (not yet consistent to achieve 4 blocks); 10/29/23: Box  and Blocks Test: 2 Blocks completed in 1 min.   Goal Status: ongoing   ASSESSMENT:  CLINICAL IMPRESSION:   Pt./caregiver reported that they received a name of the recommended surgeon at [facility] whose office is located in [facility] [place] . However they are having difficulty locating this particular physician.  An email message was sent to the [employer] Transponder rep in this region.  Pt. was able to sustain his grasp with the left hand on the 1 inch discs however had difficulty when attempting to unstack them. Patient attempted to grasp three-quarter of an inch discs. Pt. was able to sustain secure grasp on each of the discs.  Patient presented with more difficulty attempting to stack the smaller discs. Pt. continues to benefit from OT services to work on improving LUE functioning to increase engagement of the LUE during daily tasks, and to provide education about compensatory strategies during ADLs/IADLs.   PERFORMANCE DEFICITS: in functional skills including ADLs, IADLs, coordination, dexterity, proprioception, sensation, edema, tone, ROM, strength, pain, Fine motor control, Gross motor control, mobility, balance, endurance, vision, and UE functional use, cognitive skills including perception, and psychosocial skills including coping strategies, environmental adaptation, and routines and behaviors.   IMPAIRMENTS: are limiting patient from ADLs, IADLs, and leisure.   CO-MORBIDITIES: may have co-morbidities  that affects occupational performance. Patient will benefit from skilled OT to address above impairments and improve overall function.  MODIFICATION OR ASSISTANCE TO COMPLETE EVALUATION: Min-Moderate modification of tasks or assist with assess necessary to complete an evaluation.  OT OCCUPATIONAL PROFILE  AND HISTORY: Detailed assessment: Review of records and additional review of physical, cognitive, psychosocial history related to current functional performance.  CLINICAL DECISION MAKING: Moderate - several treatment options, min-mod task modification necessary  REHAB POTENTIAL: Good  EVALUATION COMPLEXITY: Moderate  PLAN:  OT FREQUENCY: 2x/week  OT DURATION: 12 weeks  PLANNED INTERVENTIONS: 97168 OT Re-evaluation, 97535 self care/ADL training, 02889 therapeutic exercise, 97530 therapeutic activity, 97112 neuromuscular re-education, 97140 manual therapy, 97018 paraffin, 02989 moist heat, 97010 cryotherapy, 97034 contrast bath, 97760 Orthotic Initial, 97763 Orthotic/Prosthetic subsequent, passive range of motion, visual/perceptual remediation/compensation, energy conservation, and DME and/or AE instructions  RECOMMENDED OTHER SERVICES: PT and ST  CONSULTED AND AGREED WITH PLAN OF CARE: Patient and family member/caregiver  PLAN FOR NEXT SESSION: see above  Richardson Otter, MS, OTR/L   02/13/24, 10:45 PM

## 2024-02-18 ENCOUNTER — Ambulatory Visit

## 2024-02-18 ENCOUNTER — Ambulatory Visit: Admitting: Physical Therapy

## 2024-02-18 DIAGNOSIS — R2681 Unsteadiness on feet: Secondary | ICD-10-CM

## 2024-02-18 DIAGNOSIS — M6281 Muscle weakness (generalized): Secondary | ICD-10-CM

## 2024-02-18 DIAGNOSIS — R269 Unspecified abnormalities of gait and mobility: Secondary | ICD-10-CM

## 2024-02-18 DIAGNOSIS — I69354 Hemiplegia and hemiparesis following cerebral infarction affecting left non-dominant side: Secondary | ICD-10-CM

## 2024-02-18 DIAGNOSIS — R278 Other lack of coordination: Secondary | ICD-10-CM

## 2024-02-18 DIAGNOSIS — R262 Difficulty in walking, not elsewhere classified: Secondary | ICD-10-CM

## 2024-02-18 NOTE — Therapy (Signed)
 OUTPATIENT PHYSICAL THERAPY TREATMENT   Patient Name: [name]. MRN: [id number] DOB:30-Sep-1950, 73 y.o., male Today's Date: 02/18/2024 PCP: Dennise Remak, MD  REFERRING PROVIDER:  Pegge Toribio PARAS, PA-C  END OF SESSION:   PT End of Session - 02/18/24 1335     Visit Number 51    Number of Visits 64    Date for Recertification  03/24/24    Authorization Type U[employer]    Authorization Time Period [facility] auth#; [id number] A for 16 PT vst from 10/09-01/02/2025    Authorization - Number of Visits 16    Progress Note Due on Visit 60    PT Start Time 1317    PT Stop Time 1357    PT Time Calculation (min) 40 min    Equipment Utilized During Treatment Gait belt    Activity Tolerance Patient tolerated treatment well;No increased pain    Behavior During Therapy WFL for tasks assessed/performed           Past Medical History:  Diagnosis Date   Anemia    Cervical myelopathy (HCC)    Chronic venous insufficiency    [name] (chronic kidney disease), stage III (HCC)    Diabetes mellitus without complication (HCC)    GERD (gastroesophageal reflux disease)    Hypercholesteremia    Hypothyroidism    Kidney stones    about 50 in the past--last in March 4/24   Leg weakness, bilateral    due to Myasthenia Gravis   Myasthenia gravis (HCC)    Spinal stenosis    Vitamin B 12 deficiency    Vitamin B 12 deficiency    Wears hearing aid    bilateral   Past Surgical History:  Procedure Laterality Date   BACK SURGERY  09/2015   CARDIAC CATHETERIZATION     CATARACT EXTRACTION W/ INTRAOCULAR LENS IMPLANT     CATARACT EXTRACTION W/PHACO Right 12/21/2015   Procedure: CATARACT EXTRACTION PHACO AND INTRAOCULAR LENS PLACEMENT (IOC);  Surgeon: Dene Etienne, MD;  Location: [facility];  Service: Ophthalmology;  Laterality: Right;  DIABETIC - insulin  and oral meds   COLONOSCOPY     SHOULDER SURGERY     labrum repair   TONSILLECTOMY     Patient Active Problem List   Diagnosis  Date Noted   Acquired left foot drop 01/10/2024   Left spastic hemiplegia (HCC) 01/10/2024   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) 10/11/2023   High blood pressure 08/13/2023   Anemia 08/13/2023   Constipation 08/13/2023   Myasthenia gravis (HCC) 08/13/2023   Acute ischemic right MCA stroke (HCC) 07/19/2023   Lymphedema 12/27/2016   Leg pain 07/15/2016   Chronic venous insufficiency 07/15/2016   Swelling of limb 07/15/2016   Type 2 diabetes mellitus (HCC) 07/15/2016   Hyperlipidemia 07/15/2016    ONSET DATE: 07/19/23  REFERRING DIAG:  P36.488 (ICD-10-CM) - Cerebral infarction due to unspecified occlusion or stenosis of right middle cerebral artery   THERAPY DIAG:  Muscle weakness (generalized)  Hemiplegia and hemiparesis following cerebral infarction affecting left non-dominant side (HCC)  Difficulty in walking, not elsewhere classified  Abnormality of gait and mobility  Unsteadiness on feet  Rationale for Evaluation and Treatment: Rehabilitation  SUBJECTIVE:  SUBJECTIVE STATEMENT:   Pt denies any pain, or falls. Is feeling good today.    From session on 01/14/2024: Patient states the MD suggested/recommended patient get an AFO for his L LE and patient has an appointment with [facility] the 2nd week of November for an assessment.     PERTINENT HISTORY:  CVA with Left hemiplegia in April 2025. Pt started on a swedish knee cage at CIR due to uncontrolled hyperextension in stance. Pt had not been moving much since discharging from [facility], as therapists instructed pt to perform transfers only at home due to high fall risk with ambulation. Continues to have significant weakenss in the LUE with difficulty extending fingers as well as numbness and  inattention to the L side of body resulting in multiple cuts on Arm and leg with WC mobility in home. Will be attaining custom power WC from [employer]. Has loaner currently.   PAIN:  Are you having pain? No  PRECAUTIONS: Fall, history of wounds on LLE   *Latex allergy   WEIGHT BEARING RESTRICTIONS: No  FALLS: Has patient fallen in last 6 months? Yes. Number of falls 1  LIVING ENVIRONMENT: Lives with: lives with their spouse Lives in: House/apartment Stairs: Ramps installed while in the Hospital  Has following equipment at home: Vannie - 2 wheeled and Wheelchair (power)  PLOF: Independent with basic ADLs, Independent with household mobility without device, and Independent with community mobility with device with [facility]   PATIENT GOALS: relearn to Walk 15-43ft.   OBJECTIVE:                                                                                                                              TREATMENT DATE: 02/18/2024  Pt arrives in transport chair.  TA- To improve functional movements patterns for everyday tasks    Gait with RW x 10 ft, heavy foot landing on L LE, assistance with L UE positioning and grip on walker- CGA  Donned lite gait harness - LiteGait harness applied with patient in standing position. Straps secured snugly around torso and legs per manufacturer guidelines. Checked for proper alignment, comfort, and safety prior to standing and gait training. All buckles and fasteners verified secure; weight-bearing support adjusted to patient tolerance  Stepped onto treadmill using lite gait for safety in harness system  Gait training - activities focussed on specific components of gait cycle and multimodal cueing for completion  Fastened L UE to treadmill support with ace wrap Lite gait speed .8-61mph x 4 min  Rest x 2 min  Treadmill speed 1 MPH x 3 min- last min min A from PT for LLE propulsion and foot clearance  Rest x 2 min - removed R UE support from treadmill X 3  min .3-. with L UE support only - heavy gait with too quick of weight shift to ea side resulting in small step length and difficulty with smooth gait  2 min rest  X 10  ea LE weight shift and slow loading of ea LE to work on smooth weight shift  X 2 min .3-mph with L UE support only - heavy gait with too quick of weight shift to ea side resulting in small step length and difficulty with smooth gait   TA- To improve functional movements patterns for everyday tasks   Retro step off of lite gait treadmill then   Doffed lite gait harness- LiteGait harness removed with patient in standing position. Due to increased abdominal pressure from harness, pt observed for dizziness post removal before transitioning to sitting for recovery.   Stand pivot transfer to from standard chair to transport chair to end session - close CGA   Doffed litegait harness.   Exited session in transport chair and in the care of his wife for transport to OT appointment.    PATIENT EDUCATION: Education details: knee cage needs assessmnet as it is causeing crossover gait.  Person educated: Patient and Spouse Education method: Explanation, VC/TC/Demo Education comprehension: verbalized understanding  HOME EXERCISE PROGRAM: Access Code: [email or web address] URL: [email or web address] Date: 01/14/2024 Prepared by: Connell Kiss  Exercises - Seated Knee Extension AROM  - 1 x daily - 4 x weekly - 3 sets - 10 reps - Seated Hip Abduction  - 1 x daily - 4 x weekly - 3 sets - 10 reps - Seated March  - 1 x daily - 4 x weekly - 3 sets - 10 reps - Sit to Stand with Counter Support  - 1 x daily - 4 x weekly - 3 sets - 10 reps - Supine Short Arc Quad  - 1 x daily - 4 x weekly - 3 sets - 10 reps - Supine Hip Abduction  - 1 x daily - 4 x weekly - 3 sets - 10 reps - Supine Bridge  - 1 x daily - 4 x weekly - 3 sets - 3 reps - 2sec  hold - Seated Hamstring Stretch with Strap  - 2-3 x daily - 7 x weekly - 2 sets - 30 second to  1 minute hold - Seated Knee Flexion Slide  - 2-3 x daily - 7 x weekly - 2 sets - 30 seconds to 1 minute hold  12/17/2023: verbal instruction to complete repeated sit<>stands with education on safe set-up provided  GOALS: Goals reviewed with patient? Yes  SHORT TERM GOALS: Target date: 02/11/2024  Patient will be independent in home exercise program to improve strength/mobility for better functional independence with ADLs. Baseline: Initiated on 09/11/2023 10/22/2023: will update when appropriate 11/26/2023: patient participating in transfer and gait practice in controlled environment with 2 person assistance at home in addition to HEP 12/31/2023: patient participating in gait using bari-RW at home with family assistance Goal status: IN PROGRESS  LONG TERM GOALS: Target date: 03/24/2024  Patient will increase SIS-16 score to equal to or greater than 10points to demonstrate statistically significant improvement in mobility and quality of life.  Baseline: 49/80 09/19/2023: 45/80 10/22/2023: 46/80 11/26/2023: 47/80 12/31/2023: 53/80 Goal status: IN PROGRESS  2.  Patient (> 66 years old) will complete five times sit to stand test in < 15 seconds indicating an increased LE strength and improved balance. Baseline: 1:27min 09/19/2023: 56.39 seconds using R UE support to push-up from armrest of green chair and requiring skilled min A for balance and lifting/lowering  10/22/2023: 27.10 seconds using R UE support to push-up from armrest  8/12: 27.7 sec with RUE pushing from arm rest.  11/26/2023: 37.42 seconds using  R UE support to push-up from armrest of green chair and not using L UE support with min-modA for facilitating anterior weight shift and lifting to come to stand secondary to fatigue (would benefit from re-testing at next session) 12/17/2023: 24.59 seconds using R UE support to push-up from armrest of green chair and not using L UE support with CGA/light min A for safety/steadying  Goal status: IN  PROGRESS  3.  Patient will increase FIST score by > 6 points to demonstrate decreased fall risk during functional activities Baseline:  43/56  09/23/2023: 53/56 Goal status: MET and UPGRADED  09/23/2023: Patient will increase Berg Balance score to > 45/56 to demonstrate improved balance and decreased fall risk during functional activities and ADLs.  Baseline: 09/23/2023: 14/56 7/31: 20/56 using RW support 11/26/2023: 17/56 without AD support 12/31/2023: 21/56 without AD support Goal status: IN PROGRESS  4.  Patient will increase 10 meter walk test to >0.66m/s as to improve gait speed for better community ambulation and to reduce fall risk. Baseline: 09/09/2023: 0.103 m/s ( and 37 seconds) using bari-RW with skilled heavy min A and +2 w/c follow for safety  09/19/2023: 0.93m/s using bari-RW with skilled min A of 1 and +2 w/c follow for safety 10/22/2023: 0.1875 m/s using bari-RW with L hand splint, L swedish knee cage, and only skilled light min A of 1 (no wheelchair follow)  8/12: 0.153m/s with RW, and L hand splint. No knee cage on this day.  11/26/2023: Average Normal speed: 0.20 m/s & Average Fast speed: 0.275 m/s using bari-RW with CGA/min A for balance wearing [facility] 12/17/2023: Average Normal speed: 0.32 m/s using bari-RW with CGA & Average Fast speed: 0.36 m/s using bari-RW requiring CGA/light min A  12/31/2023: Average Normal speed: 0.3275 m/s using bari-RW with CGA/light min A for steadying Goal status: IN PROGRESS  5.  Patient will reduce timed up and go to <11 seconds to reduce fall risk and demonstrate improved transfer/gait ability. Baseline: unable to complete turn at 10 ft. Mod-max assist due to poor control of LW on RW, resulting in L lateral LOB  09/09/2023: and 32 seconds using bari-RW with L hand splint and +2 on R side for safety, therapist providing skilled min A on L side (Had pt set-up L hand on RW splint, get L foot in proper positioning, and scoot forward in seat before  starting the timer) 09/19/2023: 55min43 seconds using bari-RW with only skilled min A of 1 (Had pt set-up L hand on RW splint, get L foot in proper positioning, and scoot forward in seat before starting the timer) 7/2: 78sec(1:18.125) with RW and min assist overall.  10/22/2023: 1 min, 30 seconds using bari-RW with L hand splint and skilled light min assist  8/12: 1:01 min with RW and L hand splint.  11/26/2023: 51.57 seconds using bari-RW with hand splint, wearing SKC, and CGA-minA for balance 12/31/2023: 43.88 seconds using bari-RW with hand splint, wearing SKC, and CGA/occasional light min A   Goal status: IN PROGRESS   ASSESSMENT:  CLINICAL IMPRESSION:   Patient arrived with good motivation for completion of pt activities.  Pt progressed with lite gait training with decreased UE support. Used treadmill to maintain consistent speed throughout. Pt rated high intensity on RPE scale from 16-17 at end of each ambulatory bout described. Will continue to progress in future visits. Pt will continue to benefit from skilled physical therapy intervention to address impairments, improve QOL, and attain therapy goals.     OBJECTIVE  IMPAIRMENTS: Abnormal gait, cardiopulmonary status limiting activity, decreased activity tolerance, decreased balance, decreased cognition, decreased coordination, decreased endurance, decreased knowledge of condition, decreased knowledge of use of DME, decreased mobility, difficulty walking, decreased ROM, decreased strength, decreased safety awareness, dizziness, hypomobility, increased fascial restrictions, impaired perceived functional ability, increased muscle spasms, impaired sensation, impaired tone, impaired UE functional use, impaired vision/preception, improper body mechanics, postural dysfunction, and obesity.   ACTIVITY LIMITATIONS: carrying, lifting, bending, sitting, standing, squatting, stairs, transfers, bed mobility, bathing, toileting, dressing, reach over head,  hygiene/grooming, locomotion level, and caring for others  PARTICIPATION LIMITATIONS: meal prep, cleaning, laundry, medication management, interpersonal relationship, driving, shopping, community activity, and yard work  PERSONAL FACTORS: Age, Past/current experiences, Time since onset of injury/illness/exacerbation, and 3+ comorbidities: MG, HTN, CVA, lymphedema are also affecting patient's functional outcome.   REHAB POTENTIAL: Good  CLINICAL DECISION MAKING: Unstable/unpredictable  EVALUATION COMPLEXITY: High  PLAN:  PT FREQUENCY: 1-2x/week  PT DURATION: 8 weeks  PLANNED INTERVENTIONS: 97164- PT Re-evaluation, 97750- Physical Performance Testing, 97110-Therapeutic exercises, 97530- Therapeutic activity, [id number]- Neuromuscular re-education, 97535- Self Care, [id number]- Manual therapy, [id number]- Gait training, [phone]- Orthotic Initial, [phone]- Orthotic/Prosthetic subsequent, [phone]- Canalith repositioning, [id number]- Electrical stimulation (unattended), [phone]- Electrical stimulation (manual), [id number]- Wound care (first 20 sq cm), 97598- Wound care (each additional 20 sq cm), Patient/Family education, Balance training, Stair training, Taping, Dry Needling, Joint mobilization, Joint manipulation, Vestibular training, Visual/preceptual remediation/compensation, Cognitive remediation, DME instructions, Wheelchair mobility training, Cryotherapy, and Moist heat  PLAN FOR NEXT SESSION:     Add STS to HEP HIGT (high intensity gait training) using litegait harness either on treadmill vs overground Continue overground gait training in litegait harness at next visit - decreasing reliance on R UE support Continue stair navigation training Dynamic gait training using bari-RW vs litegait Backwards Side stepping Obstacle navigation (turning) Working on forward propulsion with increased L hip extension and forward progression of pelvis over L stance phase Dynamic standing balance of trunk rotation Continue  monitoring SKC fit & see if posterior knee strap needs to be adjusted    Note: Portions of this document were prepared using Dragon voice recognition software and although reviewed may contain unintentional dictation errors in syntax, grammar, or spelling.  Lonni KATHEE Gainer PT ,DPT Physical Therapist- [facility]   02/18/24, 1:36 PM

## 2024-02-18 NOTE — Therapy (Signed)
 Outpatient Occupational Therapy Neuro Treatment Note  Patient Name: [name]. MRN: [id number] DOB:05-Mar-1951, 73 y.o., male Today's Date: 02/18/2024  PCP: Alm Rower, MD REFERRING PROVIDER: Hermann Toribio PARAS, PA-C  END OF SESSION:  OT End of Session - 02/18/24 1934     Visit Number 52    Number of Visits 72    Date for Recertification  03/24/24    OT Start Time 1400    OT Stop Time 1445    OT Time Calculation (min) 45 min    Activity Tolerance Patient tolerated treatment well    Behavior During Therapy WFL for tasks assessed/performed         Past Medical History:  Diagnosis Date   Anemia    Cervical myelopathy (HCC)    Chronic venous insufficiency    [name] (chronic kidney disease), stage III (HCC)    Diabetes mellitus without complication (HCC)    GERD (gastroesophageal reflux disease)    Hypercholesteremia    Hypothyroidism    Kidney stones    about 50 in the past--last in March 4/24   Leg weakness, bilateral    due to Myasthenia Gravis   Myasthenia gravis (HCC)    Spinal stenosis    Vitamin B 12 deficiency    Vitamin B 12 deficiency    Wears hearing aid    bilateral   Past Surgical History:  Procedure Laterality Date   BACK SURGERY  09/2015   CARDIAC CATHETERIZATION     CATARACT EXTRACTION W/ INTRAOCULAR LENS IMPLANT     CATARACT EXTRACTION W/PHACO Right 12/21/2015   Procedure: CATARACT EXTRACTION PHACO AND INTRAOCULAR LENS PLACEMENT (IOC);  Surgeon: Dene Etienne, MD;  Location: [facility];  Service: Ophthalmology;  Laterality: Right;  DIABETIC - insulin  and oral meds   COLONOSCOPY     SHOULDER SURGERY     labrum repair   TONSILLECTOMY     Patient Active Problem List   Diagnosis Date Noted   Acquired left foot drop 01/10/2024   Left spastic hemiplegia (HCC) 01/10/2024   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) 10/11/2023   High blood pressure 08/13/2023   Anemia 08/13/2023    Constipation 08/13/2023   Myasthenia gravis (HCC) 08/13/2023   Acute ischemic right MCA stroke (HCC) 07/19/2023   Lymphedema 12/27/2016   Leg pain 07/15/2016   Chronic venous insufficiency 07/15/2016   Swelling of limb 07/15/2016   Type 2 diabetes mellitus (HCC) 07/15/2016   Hyperlipidemia 07/15/2016   ONSET DATE: 07/13/2023  REFERRING DIAG: Acute ischemic R MCA CVA  THERAPY DIAG: muscle weakness (generalized), other lack of coordination, vision disturbance, hemiplegia and hemiparesis following cerebral infarction affecting left non-dominant side (HCC)  Rationale for Evaluation and Treatment: Rehabilitation  SUBJECTIVE:  SUBJECTIVE STATEMENT: Pt reported that he's been attempting to use his L hand as a stabilizer at his work desk at home, but struggles to keep his L hand and paper in place d/t L hand drawing closer to his body when writing his signature with the R hand. Pt accompanied by: [name]  PERTINENT HISTORY: Pt. Was admitted to [facility] on 07/13/2023 after sustaining a CVA while on a trip to the beach. Pt. Was diagnosed with R MCA CVA. Pt. Was admitted to inpatient rehab from 07/19/2023-08/13/2023. Past medical history includes high BP, anemia, and Myasthenia Gravis.   PRECAUTIONS: None  WEIGHT BEARING RESTRICTIONS: No  PAIN: 02/18/24: Mild pain in L shoulder with end range stretch  FALLS: Has patient fallen  in last 6 months? Yes  LIVING ENVIRONMENT: Lives with: lives with their family and lives with their spouse-  Lives in: House/apartment- 2 story home and resides mostly on the 1st floor Stairs: No- Ramped entrance Has following equipment at home:  Vannie, cane, Steadi, shower chair, handheld shower head, bedside commode  PLOF: Independent and Independent with basic ADLs Independent at home   PATIENT GOALS: Would like to walk 10-20 feet each time. 01/16/24: Update: pt has restarted PT visits and will be continuing to focus on mobility in those sessions.  Pt  continues to work towards improving functional use of the LUE during OT visits.   OBJECTIVE:  Note: Objective measures were completed at Evaluation unless otherwise noted.  HAND DOMINANCE: Right  ADLs: Overall ADLs:  Transfers/ambulation related to ADLs: Eating: Independent, 100% R handed Grooming: independent UB Dressing: Can put on shirt independently LB Dressing: Difficulty with pants and requires assistance getting the pants on his feet, has difficulty putting on shoes and socks Toileting: Tot A for toilet hygiene. Uses Steadi during toilet transfers. Bathing: Requires assist with using the L arm to wash the R arm. Tub Shower transfers: Roll in shower, built in shower chair, grab bars and hand held shower head.  IADLs: Shopping: Total A (Wife typically did the shopping prior to onset)   Light housekeeping: Total A (wife typically performs prior to onset) Meal Prep: Independent with light snack prep Community mobility: No driving, able to get in/out of the car Medication management: Set up assistance from his wife using a pill box (pt. Is responsible for taking medications at the correct time) Financial management: family members check behind him. Handwriting: TBD Hobbies: gardening, wood working and sports- [employer] Work history: Owns a tax business  MOBILITY STATUS: Needs Assist:    POSTURE COMMENTS:  Sitting balance: Good supported sitting balance  FUNCTIONAL OUTCOME MEASURES:  Fugl-Meyer: see below  UPPER EXTREMITY ROM:    Active ROM Right Eval  [facility] Right 09/23/2023 [facility] Left eval Left 09/23/2023 Left 10/29/23 Left 12/02/23 Left 12/31/23 Left 02/11/24  Shoulder flexion   89(110) 109(114) 111(115) 90 before scaption (110) 95 before scaption (110) 100 before scaption (110)  Shoulder abduction   98(120) 109(120) 111(122) 100 (110) 105(118) 107(121)  Shoulder adduction          Shoulder extension          Shoulder internal rotation          Shoulder external rotation           Elbow flexion   120(140) 130(136) 141(145)  146 146  Elbow extension   -28(-10) -5(0) -18(0)  -3(0) 0  Wrist flexion   60(60) Rest at 60 50(65) 54(60)     Wrist extension   -48(42) 10(40) 20(50) 30 (45) 30(45) 45(54)  Wrist ulnar deviation     12(22)     Wrist radial deviation     10(20)     Wrist pronation     (82)90   85  Wrist supination     64(74) 70 (75)  34(74)  (Blank rows = not tested)  Eval: L Digit flexion: 2nd:3cm (0cm) 3rd: 0cm (0cm), 4th: 0cm (0cm), 5th: 2cm (0cm)  09/23/23:  L Digit flexion to [facility]: 2nd: 3.5cm (0cm), 3rd: 4 cm(0cm), 4th 3.5cm (0cm), 5th: 2cm (0cm)  Eval:  L Digit extension: Active Gross digit extension through 10% of ROM  09/23/23:  L Digit extension: Active Gross digit extension through 50% of ROM  10/29/23:  L Digit extension: Active Gross digit extension through 25-50% of ROM  12/02/23:  L digit extension: Active gross ext through 30%   12/31/23:  L digit extension:  Active gross ext through ~30-40%   02/11/24:  Left full composite fist  L digit extension:  Active gross ext through 60% range  UPPER EXTREMITY MMT:     MMT Right eval Left eval Left 09/23/2023 Left 10/29/23 Left 12/02/23 Left 12/31/23 Left 02/11/24  Shoulder flexion 5 3- 3-/5 3-/5  3-/5 3-/5  Shoulder abduction 5 3- 3-/5 3-/5  3-5 3-/5  Shoulder adduction         Shoulder extension         Shoulder internal rotation         Shoulder external rotation         Middle trapezius         Lower trapezius         Elbow flexion 5 TBD 3+/5 4-/5 4+/5 4+/5 4+/5  Elbow extension 5 TBD 4-/5 3-/5 4+/5 4+/5 4+/5  Wrist flexion   2/5 3-/5 3-    Wrist extension  2- 2/5 2+/5 3- 3-/5 3-/5  Wrist ulnar deviation         Wrist radial deviation         Wrist pronation    3/5 3+  3+/5  Wrist supination    3-/5 3-  3-/5  (Blank rows = not tested)  HAND FUNCTION:  Eval:  Grip strength: Right: 94#, Left: 1#,  Lateral Key Pinch strength: Right: 14#, Left:  3#  10/29/23:  Grip strength: Left: 16# Lateral Key Pinch strength: Left: 5#  12/02/23: L: 5 lbs, R: 98 # Lateral pinch: Left: 5 lbs   12/31/23:   Grip strength: Right: 105#, Left: 14#,   02/11/24:   Grip strength: Left: 22#  COORDINATION: Box and Blocks: Right: 34 blocks completed in 1 min., Left: 0 blocks completed in 1 min.   10/29/23:  Box and Blocks: Right: 34 blocks completed in 1 min., Left: 2 blocks completed in 1 min.  9 Hole Peg Test: Left: To remove 9 pegs from a vertical position: 1 min. &13 sec.  12/02/23: Box and Blocks: 4 blocks 12/02/23: 4 pegs out in 3 min (multiple pulled and dropped out of the cup)   12/02/23: Box and Blocks Test: 5 blocks in 1 min.  02/10/24: Box and Blocks Test: 5 blocks in 1 min.  02/11/24: 1 min. & 18 sec. to remove 9 pegs.  Box and Blocks: 1 block in 1 min.   FUGL-MEYER ASSESSMENT   A. Upper Extremity   I. Reflex Activity:   12/12/23   Flexors (biceps) 2   Extensors (triceps) 2   Total 4/4                 II. Volitional Movement within Synergies:   12/12/23   Flexor Synergy:      Shoulder Elevation 2   Shoulder Retraction 2   Shoulder Abduction (at least 90*) 2   External Rotation 1   Elbow Flexion 2   Forearm Supination 1   Extensor Synergy:      Shoulder Adduction/Internal Rotation 2   Elbow Extension 1   Forearm Pronation 2   Total 15/18     III. Movement Combining Synergies:   12/12/23   Hand to Lumbar Spine 1   Shoulder Flexion at 90*, elbow at 0* 0   Pron/Sup w/elbow at 90* and shoulder at 0* 1  Total 2/6     IV. Movement Out of Synergy   12/12/23   Shoulder Abduction to 90*, elbow at 0*, forearm pronated 0   Shoulder Flexion 9)-180*, elbow at 0*, forearm in mid position 0   Pron/sup, elbow at 0* and shoulder between 30-90* of flexion 0   Total 0/6       B. Wrist   12/12/23   Stability of wrist at 15* ext, elbow at 90*, shoulder 0* 2   Repeated flex/ext, elbow at 90*, shoulder 0* 1   Stability of wrist at  15* ext, elbow at 0*, shoulder 30* 0   Flex/ext, elbow 0*, shoulder 30* 0   Circumduction  1   Total 4/10     C. Hand   12/12/23   Mass Flexion 1   Mass Extension 1   Total 2/4       D. Grasp   12/12/23   Hook  0   Thumb Adduction - paper 1   Pincer - pen 0   Cylindrical - cup/can 0   Spherical - tennis ball 1   Total 2/10     E. Coordination/Speed   12/12/23   Tremor 2   Dysmetria 0   Time 1   Total 3/6     FUGL-MEYER ASSESSMENT: UPPER EXTREMITY A-E TOTAL on (date): 32/66   SENSATION: Sensation feels different in both hands.  L hand Light touch- impaired L hand Proprioception- impaired  EDEMA: Has had hx of edema, and came to Eval session with swelling in the L hand/wrist/forearm.   *pt. has a laceration from his dog on the dorsal aspect of the L hand.   MUSCLE TONE: Increased tone through the UE and hand flexors.   COGNITION: Overall cognitive status: Within functional limits for tasks assessed  VISION: Subjective report: L sided inattention Baseline vision: Wears glasses for reading only Visual history: Right visual occlusion  VISION ASSESSMENT: To be assessed  PERCEPTION: TBD  PRAXIS: Impaired                                                                                                                 TREATMENT DATE: 02/18/2024 Self Care: -Issued new dycem square to stabilize L hand and paper on table top while using R hand to sign name; OT assist to position L hand flat on dycem and on L edge of paper to prevent sliding.  -Advised on recommended supportive positioning of L hand while working at desktop at home, including using dycem beneath hand with palm flat on thigh or table top for prolonged digit ext and tone inhibition. May also don splint for short periods at desktop (pt states he only uses L hand at desk as a stabilizer to papers), but encouraged to continue to make attempts at stabilizing papers even when splint is donned.   Therapeutic  Exercise: -L shoulder passive stretching for L shoulder IR with elbow at side and abducted to 90*, working to increase end range for shampooing hair and donning shirt overhead. -  L shoulder AAROM for ER behind head -Passive L wrist and digit ext, and active assisted MP/PIP/DIP extension reps with palm on table top, OT providing stabilization to dorsal wrist and hand.  -Passive L elbow extension   Neuro re-ed: -Facilitated tone normalization with WB trials between reps of grasp/release activities with the LUE.  OT assisted to position L palm flat on stool positioned to pt's L side in sitting.  Dycem placed beneath L hand and OT provided max A to position digits in full ext and abduction with palm flat on stool.  OT provided gentle joint compressions through the elbow and wrist, progressing to vc for pt bearing down to increase weight bearing through the hand with set up, working towards pushing up for STS transfers.  -Facilitated L hand grasp/release moving 1 velcro blocks from velcro board and releasing with extended arm on table top.  Velcro board positioned on an incline to challenge wrist ext while reaching for blocks.  Board position moved to challenge forward and lateral reaching patterns.    Education: Education details:  WB through the hand; strategies to engage the L hand as a stabilizer at work desk in the home  Person educated: Patient and spouse Education method: Explanation, Demonstration, Tactile cues, and Verbal cues Education comprehension: verbalized understanding, further training needed  HOME EXERCISE PROGRAM: -grasping and stacking 1, and 1/2 cubes form the tabletop surface; yellow theraband for LUE strengthening   GOALS: Goals reviewed with patient? Yes  SHORT TERM GOALS: Target date: 02/11/2024   Pt. Will be independent with HEP for the LUE.  Baseline: 02/11/24: Independent 12/31/23: Independent; ongoing 12/03/23: indep with current HEP, though ongoing progressions are  being made as UE function improves; 10/29/23: Pt. requires assist from wife with HEPs. Pt. Is consistently working on HEP's at home. 09/19/23: Requires assist from his wife Eval; no current HEP Goal status: Achieved,Ongoing  LONG TERM GOALS: Target date: 03/24/2024  Pt. Will increase L shoulder flexion by 10 degrees to be able to reach up to shelves. Baseline: 02/11/24: 100 before scaption (110) 12/31/23: Left: 95 degrees before scaption, 110 of scaption 12/03/23: 90* before onset of scaption; 10/29/23: 111(115) 09/19/23: Pt. Continues to be limited with reaching up to shelves. Eval: L shoulder flexion is 89 (110). Goal status: progressing, Ongoing  2.  Pt. Will increase L shoulder abduction by 10 degrees to assist with underarm hygiene.  Baseline: 02/11/24: 892(878) 12/31/23: Left: 894(881) 12/03/23: 100*; pt able to demo bilat underarm hygiene with set up; 10/29/23: 888(877) 09/19/23: Pt. Continues to require assist with underarm hygiene.Eval: L shoulder abduction is 98 (120).  Goal status: Progressing, ongoing  3.  Pt. Will improve L wrist extension by 10 degrees to be able to initiate anticipation of grasping for objects.  Baseline: 02/11/24: 45(54) 12/31/23: 30(45) 12/03/23: 30* (45); 10/29/23: 20(55) 09/19/23: Consistent activation noted in the left wrist extensors with facilitation. Eval: -48 (42) Goal status: Ongoing  4.  Pt. Will improve active gross digit extension through 50% of the range to be able to release objects in his hand consistently.  Baseline:02/11/24: L digit extension: Active gross ext through 60% range 12/31/23: Gross digit extension: ~30-40% 12/03/23: Gross digit ext through ~30% of range (limited by flexor tone in the PIPs); 10/28/20: Gross digit extension through 25-50% of the range. 09/19/23: gross digit extension noted through 25% range with facilitation.Eval: gross digit extension is 10% of the range  Goal status: Ongoing  5.  Pt. Will independently demonstrate visual compensatory  strategies when  navigating through environment and during tabletop tasks. Baseline: 12/03/23: Pt demonstrates consistent head turns to scan to L visual field without cueing; 10/29/2023: Pt. Continues to less cuing for left sided awareness. 09/19/23: Pt. requires fewer cues for left sided awareness Eval: Pt. Requires consistent cuing for L sided awareness.  Goal status: achieved   6. Pt. Will improve left hand [facility] skills to be able to independently manipulate small objects during ADLs, and IADLs. Baseline: 02/11/24: 9 Hole Peg Test:1 min. & 18 sec. to remove 9 pegs.12/31/23: Pt. Is progressing to be able to grasp progressively smaller objects. 12/03/23: Pt removed 3 pegs in >3 min, pulling out more than 3 but repeatedly dropping to floor or table top rather than assessment dish; 10/29/23: 9 Hole Peg Test: Pt. Was able to remove 9 pegs from vertical position on the pegboard in 1 min. & 13 sec. Pt. Is not yet able to pick up pegs from a horizontal position to place them in the pegboard.   Goal status: ongoing   7. Pt will improve left hand function skills as evidenced by improved score to 4 blocks on the Box and Blocks test. Baseline: 02/11/24 Box and Blocks: 1 block in 1 min. 12/31/23: 4 blocks in 1 min. 12/03/23: 4 blocks after 3 trials and passive wrist and digit stretching between trials (not yet consistent to achieve 4 blocks); 10/29/23: Box and Blocks Test: 2 Blocks completed in 1 min.   Goal Status: ongoing   ASSESSMENT:  CLINICAL IMPRESSION:  Pt with good tolerance to neuro re-ed and therapeutic exercises noted above. Mild discomfort in L shoulder only at end range ER when working to increase range for UB ADLs.  Good tone reduction after reps of WB through the hand with L palm flat on stool, allowing high reps of grasp/release activity as noted above.  WB completed after every 1-3 blocks.  Pt was able to grasp 1 blocks from velcro and release toward a target, only dropping 25-30% unintentionally.  Once  blocks were secured in L hand, pt was able to release 100% of blocks using active digit ext.  Active PIP ext remains limited, but improving.  Pt is tolerating pushing through the L hand without reports of pain in the wrist.  Will work towards engaging L hand into a position of WB during STS transfers in upcoming sessions.  Pt was able to stabilize paper with L hand on table top using dycem, allowing pt to sign papers without paper or L hand sliding from table top.  Pt plans to practice this strategy while working at his desk at home.  Pt. continues to benefit from OT services to work on improving LUE functioning to increase engagement of the LUE during daily tasks, and to provide education about compensatory strategies during ADLs/IADLs.   PERFORMANCE DEFICITS: in functional skills including ADLs, IADLs, coordination, dexterity, proprioception, sensation, edema, tone, ROM, strength, pain, Fine motor control, Gross motor control, mobility, balance, endurance, vision, and UE functional use, cognitive skills including perception, and psychosocial skills including coping strategies, environmental adaptation, and routines and behaviors.   IMPAIRMENTS: are limiting patient from ADLs, IADLs, and leisure.   CO-MORBIDITIES: may have co-morbidities  that affects occupational performance. Patient will benefit from skilled OT to address above impairments and improve overall function.  MODIFICATION OR ASSISTANCE TO COMPLETE EVALUATION: Min-Moderate modification of tasks or assist with assess necessary to complete an evaluation.  OT OCCUPATIONAL PROFILE AND HISTORY: Detailed assessment: Review of records and additional review of physical, cognitive,  psychosocial history related to current functional performance.  CLINICAL DECISION MAKING: Moderate - several treatment options, min-mod task modification necessary  REHAB POTENTIAL: Good  EVALUATION COMPLEXITY: Moderate  PLAN:  OT FREQUENCY: 2x/week  OT  DURATION: 12 weeks  PLANNED INTERVENTIONS: 97168 OT Re-evaluation, 97535 self care/ADL training, 02889 therapeutic exercise, 97530 therapeutic activity, 97112 neuromuscular re-education, 97140 manual therapy, 97018 paraffin, 02989 moist heat, 97010 cryotherapy, 97034 contrast bath, 97760 Orthotic Initial, 97763 Orthotic/Prosthetic subsequent, passive range of motion, visual/perceptual remediation/compensation, energy conservation, and DME and/or AE instructions  RECOMMENDED OTHER SERVICES: PT and ST  CONSULTED AND AGREED WITH PLAN OF CARE: Patient and family member/caregiver  PLAN FOR NEXT SESSION: see above  Inocente Blazing, MS, OTR/L  02/18/24, 7:36 PM

## 2024-02-25 ENCOUNTER — Ambulatory Visit: Attending: Physician Assistant

## 2024-02-25 ENCOUNTER — Ambulatory Visit: Admitting: Occupational Therapy

## 2024-02-25 DIAGNOSIS — R278 Other lack of coordination: Secondary | ICD-10-CM

## 2024-02-25 DIAGNOSIS — R262 Difficulty in walking, not elsewhere classified: Secondary | ICD-10-CM | POA: Insufficient documentation

## 2024-02-25 DIAGNOSIS — I69354 Hemiplegia and hemiparesis following cerebral infarction affecting left non-dominant side: Secondary | ICD-10-CM | POA: Insufficient documentation

## 2024-02-25 DIAGNOSIS — M6281 Muscle weakness (generalized): Secondary | ICD-10-CM | POA: Diagnosis present

## 2024-02-25 DIAGNOSIS — R2681 Unsteadiness on feet: Secondary | ICD-10-CM | POA: Insufficient documentation

## 2024-02-25 DIAGNOSIS — R269 Unspecified abnormalities of gait and mobility: Secondary | ICD-10-CM | POA: Insufficient documentation

## 2024-02-25 NOTE — Therapy (Signed)
 OUTPATIENT PHYSICAL THERAPY TREATMENT   Patient Name: [name]. MRN: [id number] DOB:10/08/1950, 73 y.o., male Today's Date: 02/25/2024 PCP: Dennise Remak, MD  REFERRING PROVIDER:  Pegge Toribio PARAS, PA-C  END OF SESSION:   PT End of Session - 02/25/24 1321     Visit Number 52    Number of Visits 64    Date for Recertification  03/24/24    Authorization Type U[employer]    Authorization Time Period [facility] auth#; [id number] A for 16 PT vst from 10/09-01/02/2025    Authorization - Number of Visits 16    Progress Note Due on Visit 60    PT Start Time 1319    PT Stop Time 1400    PT Time Calculation (min) 41 min    Equipment Utilized During Treatment Gait belt    Activity Tolerance Patient tolerated treatment well;No increased pain    Behavior During Therapy WFL for tasks assessed/performed         Past Medical History:  Diagnosis Date   Anemia    Cervical myelopathy (HCC)    Chronic venous insufficiency    [name] (chronic kidney disease), stage III (HCC)    Diabetes mellitus without complication (HCC)    GERD (gastroesophageal reflux disease)    Hypercholesteremia    Hypothyroidism    Kidney stones    about 50 in the past--last in March 4/24   Leg weakness, bilateral    due to Myasthenia Gravis   Myasthenia gravis (HCC)    Spinal stenosis    Vitamin B 12 deficiency    Vitamin B 12 deficiency    Wears hearing aid    bilateral   Past Surgical History:  Procedure Laterality Date   BACK SURGERY  09/2015   CARDIAC CATHETERIZATION     CATARACT EXTRACTION W/ INTRAOCULAR LENS IMPLANT     CATARACT EXTRACTION W/PHACO Right 12/21/2015   Procedure: CATARACT EXTRACTION PHACO AND INTRAOCULAR LENS PLACEMENT (IOC);  Surgeon: Dene Etienne, MD;  Location: [facility];  Service: Ophthalmology;  Laterality: Right;  DIABETIC - insulin  and oral meds   COLONOSCOPY     SHOULDER SURGERY     labrum repair   TONSILLECTOMY     Patient Active Problem List   Diagnosis Date  Noted   Acquired left foot drop 01/10/2024   Left spastic hemiplegia (HCC) 01/10/2024   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) 10/11/2023   High blood pressure 08/13/2023   Anemia 08/13/2023   Constipation 08/13/2023   Myasthenia gravis (HCC) 08/13/2023   Acute ischemic right MCA stroke (HCC) 07/19/2023   Lymphedema 12/27/2016   Leg pain 07/15/2016   Chronic venous insufficiency 07/15/2016   Swelling of limb 07/15/2016   Type 2 diabetes mellitus (HCC) 07/15/2016   Hyperlipidemia 07/15/2016    ONSET DATE: 07/19/23  REFERRING DIAG:  P36.488 (ICD-10-CM) - Cerebral infarction due to unspecified occlusion or stenosis of right middle cerebral artery   THERAPY DIAG:  Muscle weakness (generalized)  Other lack of coordination  Hemiplegia and hemiparesis following cerebral infarction affecting left non-dominant side (HCC)  Difficulty in walking, not elsewhere classified  Abnormality of gait and mobility  Unsteadiness on feet  Rationale for Evaluation and Treatment: Rehabilitation  SUBJECTIVE:  SUBJECTIVE STATEMENT:   Pt reports they had a good Thanksgiving and was busy with cooking.   From session on 01/14/2024: Patient states the MD suggested/recommended patient get an AFO for his L LE and patient has an appointment with [facility] the 2nd week of November for an assessment.     PERTINENT HISTORY:  CVA with Left hemiplegia in April 2025. Pt started on a swedish knee cage at CIR due to uncontrolled hyperextension in stance. Pt had not been moving much since discharging from [facility], as therapists instructed pt to perform transfers only at home due to high fall risk with ambulation. Continues to have significant weakenss in the LUE with difficulty  extending fingers as well as numbness and inattention to the L side of body resulting in multiple cuts on Arm and leg with WC mobility in home. Will be attaining custom power WC from [employer]. Has loaner currently.   PAIN:  Are you having pain? No  PRECAUTIONS: Fall, history of wounds on LLE   *Latex allergy   WEIGHT BEARING RESTRICTIONS: No  FALLS: Has patient fallen in last 6 months? Yes. Number of falls 1  LIVING ENVIRONMENT: Lives with: lives with their spouse Lives in: House/apartment Stairs: Ramps installed while in the Hospital  Has following equipment at home: Vannie - 2 wheeled and Wheelchair (power)  PLOF: Independent with basic ADLs, Independent with household mobility without device, and Independent with community mobility with device with [facility]   PATIENT GOALS: relearn to Walk 15-33ft.   OBJECTIVE:                                                                                                                              TREATMENT DATE: 02/25/2024    Pt arrives in transport chair.   TA- To improve functional movements patterns for everyday tasks   Donned lite gait harness - LiteGait harness applied with patient in standing position. Straps secured snugly around torso and legs per manufacturer guidelines. Checked for proper alignment, comfort, and safety prior to standing and gait training. All buckles and fasteners verified secure; weight-bearing support adjusted to patient tolerance  Pt with instability in standing and therapist had to provide modA to prevent the pt from falling forward, and pt utilized the LiteGait to stability himself.  LiteGait was locked in place which allowed the pt to safely utilize it.   L hand was secured into place on the handle of the LiteGait with use of an ace bandage.   Gait training - activities focussed on specific components of gait cycle and multimodal cueing for completion  Gait training overground; the following trials completed  in litegait harness for safety, but not providing BWS (body weight support), with therapist assist for driving litegait.   Pt able to ambulate around the gym, down EVS hallway, past cafeteria, down main hallway to rehab area, and on the rehab hallway, pt performed ambulation without the R UE on the LiteGait. Total  distance accumulated is ~500' at one time.  Pt did take two  standing rest breaks, throughout the attempt due to the L LE being fatigued and unable to progress forward as well.  Pt perform retro ambulation in the hallway, as well as lateral ambulation to the R only because he was too weak to go to the L.    TA- To improve functional movements patterns for everyday tasks    Doffed lite gait harness- LiteGait harness removed with patient in standing position. Due to increased abdominal pressure from harness, pt observed for dizziness post removal before transitioning to sitting for recovery.   Stand pivot transfer to from standard chair to transport chair to end session - close CGA  Doffed litegait harness.   Exited session in transport chair at OT session    PATIENT EDUCATION: Education details: knee cage needs assessmnet as it is causeing crossover gait.  Person educated: Patient and Spouse Education method: Explanation, VC/TC/Demo Education comprehension: verbalized understanding  HOME EXERCISE PROGRAM: Access Code: [email or web address] URL: [email or web address] Date: 01/14/2024 Prepared by: Connell Kiss  Exercises - Seated Knee Extension AROM  - 1 x daily - 4 x weekly - 3 sets - 10 reps - Seated Hip Abduction  - 1 x daily - 4 x weekly - 3 sets - 10 reps - Seated March  - 1 x daily - 4 x weekly - 3 sets - 10 reps - Sit to Stand with Counter Support  - 1 x daily - 4 x weekly - 3 sets - 10 reps - Supine Short Arc Quad  - 1 x daily - 4 x weekly - 3 sets - 10 reps - Supine Hip Abduction  - 1 x daily - 4 x weekly - 3 sets - 10 reps - Supine Bridge  - 1 x daily - 4 x  weekly - 3 sets - 3 reps - 2sec  hold - Seated Hamstring Stretch with Strap  - 2-3 x daily - 7 x weekly - 2 sets - 30 second to 1 minute hold - Seated Knee Flexion Slide  - 2-3 x daily - 7 x weekly - 2 sets - 30 seconds to 1 minute hold  12/17/2023: verbal instruction to complete repeated sit<>stands with education on safe set-up provided  GOALS: Goals reviewed with patient? Yes  SHORT TERM GOALS: Target date: 02/11/2024  Patient will be independent in home exercise program to improve strength/mobility for better functional independence with ADLs. Baseline: Initiated on 09/11/2023 10/22/2023: will update when appropriate 11/26/2023: patient participating in transfer and gait practice in controlled environment with 2 person assistance at home in addition to HEP 12/31/2023: patient participating in gait using bari-RW at home with family assistance Goal status: IN PROGRESS  LONG TERM GOALS: Target date: 03/24/2024  Patient will increase SIS-16 score to equal to or greater than 10points to demonstrate statistically significant improvement in mobility and quality of life.  Baseline: 49/80 09/19/2023: 45/80 10/22/2023: 46/80 11/26/2023: 47/80 12/31/2023: 53/80 Goal status: IN PROGRESS  2.  Patient (> 44 years old) will complete five times sit to stand test in < 15 seconds indicating an increased LE strength and improved balance. Baseline: 1:56min 09/19/2023: 56.39 seconds using R UE support to push-up from armrest of green chair and requiring skilled min A for balance and lifting/lowering  10/22/2023: 27.10 seconds using R UE support to push-up from armrest  8/12: 27.7 sec with RUE pushing from arm rest.  11/26/2023: 37.42 seconds using R UE support to push-up from  armrest of green chair and not using L UE support with min-modA for facilitating anterior weight shift and lifting to come to stand secondary to fatigue (would benefit from re-testing at next session) 12/17/2023: 24.59 seconds using R UE  support to push-up from armrest of green chair and not using L UE support with CGA/light min A for safety/steadying  Goal status: IN PROGRESS  3.  Patient will increase FIST score by > 6 points to demonstrate decreased fall risk during functional activities Baseline:  43/56  09/23/2023: 53/56 Goal status: MET and UPGRADED  09/23/2023: Patient will increase Berg Balance score to > 45/56 to demonstrate improved balance and decreased fall risk during functional activities and ADLs.  Baseline: 09/23/2023: 14/56 7/31: 20/56 using RW support 11/26/2023: 17/56 without AD support 12/31/2023: 21/56 without AD support Goal status: IN PROGRESS  4.  Patient will increase 10 meter walk test to >0.20m/s as to improve gait speed for better community ambulation and to reduce fall risk. Baseline: 09/09/2023: 0.103 m/s ( and 37 seconds) using bari-RW with skilled heavy min A and +2 w/c follow for safety  09/19/2023: 0.53m/s using bari-RW with skilled min A of 1 and +2 w/c follow for safety 10/22/2023: 0.1875 m/s using bari-RW with L hand splint, L swedish knee cage, and only skilled light min A of 1 (no wheelchair follow)  8/12: 0.163m/s with RW, and L hand splint. No knee cage on this day.  11/26/2023: Average Normal speed: 0.20 m/s & Average Fast speed: 0.275 m/s using bari-RW with CGA/min A for balance wearing [facility] 12/17/2023: Average Normal speed: 0.32 m/s using bari-RW with CGA & Average Fast speed: 0.36 m/s using bari-RW requiring CGA/light min A  12/31/2023: Average Normal speed: 0.3275 m/s using bari-RW with CGA/light min A for steadying Goal status: IN PROGRESS  5.  Patient will reduce timed up and go to <11 seconds to reduce fall risk and demonstrate improved transfer/gait ability. Baseline: unable to complete turn at 10 ft. Mod-max assist due to poor control of LW on RW, resulting in L lateral LOB  09/09/2023: and 32 seconds using bari-RW with L hand splint and +2 on R side for safety, therapist  providing skilled min A on L side (Had pt set-up L hand on RW splint, get L foot in proper positioning, and scoot forward in seat before starting the timer) 09/19/2023: 73min43 seconds using bari-RW with only skilled min A of 1 (Had pt set-up L hand on RW splint, get L foot in proper positioning, and scoot forward in seat before starting the timer) 7/2: 78sec(1:18.125) with RW and min assist overall.  10/22/2023: 1 min, 30 seconds using bari-RW with L hand splint and skilled light min assist  8/12: 1:01 min with RW and L hand splint.  11/26/2023: 51.57 seconds using bari-RW with hand splint, wearing SKC, and CGA-minA for balance 12/31/2023: 43.88 seconds using bari-RW with hand splint, wearing SKC, and CGA/occasional light min A   Goal status: IN PROGRESS   ASSESSMENT:  CLINICAL IMPRESSION:   Pt performed well today, however noted to be much more fatigued and short of breathe with ambulation attempt today.  Pt also had one incident where he needing assistance from therapist to prevent falling forward into the LiteGait machine.  This occurred at the beginning of the session and was able to safely transition to the LiteGait machine without any more complications.  Pt introduced to lateral ambulation with the LiteGait and will continue to likely utilize different directional mobility to  challenge the pt in future sessions.   Pt will continue to benefit from skilled therapy to address remaining deficits in order to improve overall QoL and return to PLOF.       OBJECTIVE IMPAIRMENTS: Abnormal gait, cardiopulmonary status limiting activity, decreased activity tolerance, decreased balance, decreased cognition, decreased coordination, decreased endurance, decreased knowledge of condition, decreased knowledge of use of DME, decreased mobility, difficulty walking, decreased ROM, decreased strength, decreased safety awareness, dizziness, hypomobility, increased fascial restrictions, impaired perceived functional  ability, increased muscle spasms, impaired sensation, impaired tone, impaired UE functional use, impaired vision/preception, improper body mechanics, postural dysfunction, and obesity.   ACTIVITY LIMITATIONS: carrying, lifting, bending, sitting, standing, squatting, stairs, transfers, bed mobility, bathing, toileting, dressing, reach over head, hygiene/grooming, locomotion level, and caring for others  PARTICIPATION LIMITATIONS: meal prep, cleaning, laundry, medication management, interpersonal relationship, driving, shopping, community activity, and yard work  PERSONAL FACTORS: Age, Past/current experiences, Time since onset of injury/illness/exacerbation, and 3+ comorbidities: MG, HTN, CVA, lymphedema are also affecting patient's functional outcome.   REHAB POTENTIAL: Good  CLINICAL DECISION MAKING: Unstable/unpredictable  EVALUATION COMPLEXITY: High  PLAN:  PT FREQUENCY: 1-2x/week  PT DURATION: 8 weeks  PLANNED INTERVENTIONS: 97164- PT Re-evaluation, 97750- Physical Performance Testing, 97110-Therapeutic exercises, 97530- Therapeutic activity, [id number]- Neuromuscular re-education, 97535- Self Care, [id number]- Manual therapy, [id number]- Gait training, [phone]- Orthotic Initial, [phone]- Orthotic/Prosthetic subsequent, [phone]- Canalith repositioning, H9716- Electrical stimulation (unattended), [phone]- Electrical stimulation (manual), [id number]- Wound care (first 20 sq cm), 97598- Wound care (each additional 20 sq cm), Patient/Family education, Balance training, Stair training, Taping, Dry Needling, Joint mobilization, Joint manipulation, Vestibular training, Visual/preceptual remediation/compensation, Cognitive remediation, DME instructions, Wheelchair mobility training, Cryotherapy, and Moist heat  PLAN FOR NEXT SESSION:    Add STS to HEP HIGT (high intensity gait training) using litegait harness either on treadmill vs overground Continue overground gait training in litegait harness at next visit - decreasing  reliance on R UE support Continue stair navigation training Dynamic gait training using bari-RW vs litegait Backwards Side stepping Obstacle navigation (turning) Working on forward propulsion with increased L hip extension and forward progression of pelvis over L stance phase Dynamic standing balance of trunk rotation Continue monitoring SKC fit & see if posterior knee strap needs to be adjusted    Fonda Simpers, PT, DPT Physical Therapist - [facility]  02/25/24, 3:26 PM

## 2024-02-25 NOTE — Therapy (Signed)
 Outpatient Occupational Therapy Neuro Treatment Note  Patient Name: [name]. MRN: [id number] DOB:1950-04-13, 73 y.o., male Today's Date: 02/25/2024  PCP: Alm Rower, MD REFERRING PROVIDER: Hermann Toribio PARAS, PA-C  END OF SESSION:  OT End of Session - 02/25/24 1656     Visit Number 53    Number of Visits 72    Date for Recertification  03/24/24    OT Start Time 1400    OT Stop Time 1445    OT Time Calculation (min) 45 min    Activity Tolerance Patient tolerated treatment well    Behavior During Therapy [facility] for tasks assessed/performed         Past Medical History:  Diagnosis Date   Anemia    Cervical myelopathy (HCC)    Chronic venous insufficiency    [name] (chronic kidney disease), stage III (HCC)    Diabetes mellitus without complication (HCC)    GERD (gastroesophageal reflux disease)    Hypercholesteremia    Hypothyroidism    Kidney stones    about 50 in the past--last in March 4/24   Leg weakness, bilateral    due to Myasthenia Gravis   Myasthenia gravis (HCC)    Spinal stenosis    Vitamin B 12 deficiency    Vitamin B 12 deficiency    Wears hearing aid    bilateral   Past Surgical History:  Procedure Laterality Date   BACK SURGERY  09/2015   CARDIAC CATHETERIZATION     CATARACT EXTRACTION W/ INTRAOCULAR LENS IMPLANT     CATARACT EXTRACTION W/PHACO Right 12/21/2015   Procedure: CATARACT EXTRACTION PHACO AND INTRAOCULAR LENS PLACEMENT (IOC);  Surgeon: Dene Etienne, MD;  Location: [facility];  Service: Ophthalmology;  Laterality: Right;  DIABETIC - insulin  and oral meds   COLONOSCOPY     SHOULDER SURGERY     labrum repair   TONSILLECTOMY     Patient Active Problem List   Diagnosis Date Noted   Acquired left foot drop 01/10/2024   Left spastic hemiplegia (HCC) 01/10/2024   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) 10/11/2023   High blood pressure 08/13/2023   Anemia 08/13/2023    Constipation 08/13/2023   Myasthenia gravis (HCC) 08/13/2023   Acute ischemic right MCA stroke (HCC) 07/19/2023   Lymphedema 12/27/2016   Leg pain 07/15/2016   Chronic venous insufficiency 07/15/2016   Swelling of limb 07/15/2016   Type 2 diabetes mellitus (HCC) 07/15/2016   Hyperlipidemia 07/15/2016   ONSET DATE: 07/13/2023  REFERRING DIAG: Acute ischemic R MCA CVA  THERAPY DIAG: muscle weakness (generalized), other lack of coordination, vision disturbance, hemiplegia and hemiparesis following cerebral infarction affecting left non-dominant side (HCC)  Rationale for Evaluation and Treatment: Rehabilitation  SUBJECTIVE:  SUBJECTIVE STATEMENT:  Pt. Reports that he cooked for the Thanksgiving dinner.  Pt accompanied by: [name]  PERTINENT HISTORY: Pt. Was admitted to [facility] on 07/13/2023 after sustaining a CVA while on a trip to the beach. Pt. Was diagnosed with R MCA CVA. Pt. Was admitted to inpatient rehab from 07/19/2023-08/13/2023. Past medical history includes high BP, anemia, and Myasthenia Gravis.   PRECAUTIONS: None  WEIGHT BEARING RESTRICTIONS: No  PAIN: 02/18/24: Mild pain in L shoulder with end range stretch  FALLS: Has patient fallen in last 6 months? Yes  LIVING ENVIRONMENT: Lives with: lives with their family and lives with their spouse-  Lives in: House/apartment- 2 story home and resides mostly on the 1st floor Stairs: No- Ramped  entrance Has following equipment at home:  Vannie, cane, Steadi, shower chair, handheld shower head, bedside commode  PLOF: Independent and Independent with basic ADLs Independent at home   PATIENT GOALS: Would like to walk 10-20 feet each time. 01/16/24: Update: pt has restarted PT visits and will be continuing to focus on mobility in those sessions.  Pt continues to work towards improving functional use of the LUE during OT visits.   OBJECTIVE:  Note: Objective measures were completed at Evaluation unless otherwise  noted.  HAND DOMINANCE: Right  ADLs: Overall ADLs:  Transfers/ambulation related to ADLs: Eating: Independent, 100% R handed Grooming: independent UB Dressing: Can put on shirt independently LB Dressing: Difficulty with pants and requires assistance getting the pants on his feet, has difficulty putting on shoes and socks Toileting: Tot A for toilet hygiene. Uses Steadi during toilet transfers. Bathing: Requires assist with using the L arm to wash the R arm. Tub Shower transfers: Roll in shower, built in shower chair, grab bars and hand held shower head.  IADLs: Shopping: Total A (Wife typically did the shopping prior to onset)   Light housekeeping: Total A (wife typically performs prior to onset) Meal Prep: Independent with light snack prep Community mobility: No driving, able to get in/out of the car Medication management: Set up assistance from his wife using a pill box (pt. Is responsible for taking medications at the correct time) Financial management: family members check behind him. Handwriting: TBD Hobbies: gardening, wood working and sports- [employer] Work history: Owns a tax business  MOBILITY STATUS: Needs Assist:    POSTURE COMMENTS:  Sitting balance: Good supported sitting balance  FUNCTIONAL OUTCOME MEASURES:  Fugl-Meyer: see below  UPPER EXTREMITY ROM:    Active ROM Right Eval  [facility] Right 09/23/2023 [facility] Left eval Left 09/23/2023 Left 10/29/23 Left 12/02/23 Left 12/31/23 Left 02/11/24  Shoulder flexion   89(110) 109(114) 111(115) 90 before scaption (110) 95 before scaption (110) 100 before scaption (110)  Shoulder abduction   98(120) 109(120) 111(122) 100 (110) 105(118) 107(121)  Shoulder adduction          Shoulder extension          Shoulder internal rotation          Shoulder external rotation          Elbow flexion   120(140) 130(136) 141(145)  146 146  Elbow extension   -28(-10) -5(0) -18(0)  -3(0) 0  Wrist flexion   60(60) Rest at 60 50(65) 54(60)      Wrist extension   -48(42) 10(40) 20(50) 30 (45) 30(45) 45(54)  Wrist ulnar deviation     12(22)     Wrist radial deviation     10(20)     Wrist pronation     (82)90   85  Wrist supination     64(74) 70 (75)  34(74)  (Blank rows = not tested)  Eval: L Digit flexion: 2nd:3cm (0cm) 3rd: 0cm (0cm), 4th: 0cm (0cm), 5th: 2cm (0cm)  09/23/23:  L Digit flexion to [facility]: 2nd: 3.5cm (0cm), 3rd: 4 cm(0cm), 4th 3.5cm (0cm), 5th: 2cm (0cm)  Eval:  L Digit extension: Active Gross digit extension through 10% of ROM  09/23/23:  L Digit extension: Active Gross digit extension through 50% of ROM  10/29/23:  L Digit extension: Active Gross digit extension through 25-50% of ROM  12/02/23:  L digit extension: Active gross ext through 30%   12/31/23:  L digit extension:  Active gross ext through ~30-40%  02/11/24:  Left full composite fist  L digit extension:  Active gross ext through 60% range  UPPER EXTREMITY MMT:     MMT Right eval Left eval Left 09/23/2023 Left 10/29/23 Left 12/02/23 Left 12/31/23 Left 02/11/24  Shoulder flexion 5 3- 3-/5 3-/5  3-/5 3-/5  Shoulder abduction 5 3- 3-/5 3-/5  3-5 3-/5  Shoulder adduction         Shoulder extension         Shoulder internal rotation         Shoulder external rotation         Middle trapezius         Lower trapezius         Elbow flexion 5 TBD 3+/5 4-/5 4+/5 4+/5 4+/5  Elbow extension 5 TBD 4-/5 3-/5 4+/5 4+/5 4+/5  Wrist flexion   2/5 3-/5 3-    Wrist extension  2- 2/5 2+/5 3- 3-/5 3-/5  Wrist ulnar deviation         Wrist radial deviation         Wrist pronation    3/5 3+  3+/5  Wrist supination    3-/5 3-  3-/5  (Blank rows = not tested)  HAND FUNCTION:  Eval:  Grip strength: Right: 94#, Left: 1#,  Lateral Key Pinch strength: Right: 14#, Left: 3#  10/29/23:  Grip strength: Left: 16# Lateral Key Pinch strength: Left: 5#  12/02/23: L: 5 lbs, R: 98 # Lateral pinch: Left: 5 lbs   12/31/23:   Grip strength: Right:  105#, Left: 14#,   02/11/24:   Grip strength: Left: 22#  COORDINATION: Box and Blocks: Right: 34 blocks completed in 1 min., Left: 0 blocks completed in 1 min.   10/29/23:  Box and Blocks: Right: 34 blocks completed in 1 min., Left: 2 blocks completed in 1 min.  9 Hole Peg Test: Left: To remove 9 pegs from a vertical position: 1 min. &13 sec.  12/02/23: Box and Blocks: 4 blocks 12/02/23: 4 pegs out in 3 min (multiple pulled and dropped out of the cup)   12/02/23: Box and Blocks Test: 5 blocks in 1 min.  02/10/24: Box and Blocks Test: 5 blocks in 1 min.  02/11/24: 1 min. & 18 sec. to remove 9 pegs.  Box and Blocks: 1 block in 1 min.   FUGL-MEYER ASSESSMENT   A. Upper Extremity   I. Reflex Activity:   12/12/23   Flexors (biceps) 2   Extensors (triceps) 2   Total 4/4                 II. Volitional Movement within Synergies:   12/12/23   Flexor Synergy:      Shoulder Elevation 2   Shoulder Retraction 2   Shoulder Abduction (at least 90*) 2   External Rotation 1   Elbow Flexion 2   Forearm Supination 1   Extensor Synergy:      Shoulder Adduction/Internal Rotation 2   Elbow Extension 1   Forearm Pronation 2   Total 15/18     III. Movement Combining Synergies:   12/12/23   Hand to Lumbar Spine 1   Shoulder Flexion at 90*, elbow at 0* 0   Pron/Sup w/elbow at 90* and shoulder at 0* 1   Total 2/6     IV. Movement Out of Synergy   12/12/23   Shoulder Abduction to 90*, elbow at 0*, forearm pronated 0   Shoulder Flexion 9)-180*, elbow at 0*, forearm in  mid position 0   Pron/sup, elbow at 0* and shoulder between 30-90* of flexion 0   Total 0/6       B. Wrist   12/12/23   Stability of wrist at 15* ext, elbow at 90*, shoulder 0* 2   Repeated flex/ext, elbow at 90*, shoulder 0* 1   Stability of wrist at 15* ext, elbow at 0*, shoulder 30* 0   Flex/ext, elbow 0*, shoulder 30* 0   Circumduction  1   Total 4/10     C. Hand   12/12/23   Mass Flexion 1   Mass Extension 1    Total 2/4       D. Grasp   12/12/23   Hook  0   Thumb Adduction - paper 1   Pincer - pen 0   Cylindrical - cup/can 0   Spherical - tennis ball 1   Total 2/10     E. Coordination/Speed   12/12/23   Tremor 2   Dysmetria 0   Time 1   Total 3/6     FUGL-MEYER ASSESSMENT: UPPER EXTREMITY A-E TOTAL on (date): 32/66   SENSATION: Sensation feels different in both hands.  L hand Light touch- impaired L hand Proprioception- impaired  EDEMA: Has had hx of edema, and came to Eval session with swelling in the L hand/wrist/forearm.   *pt. has a laceration from his dog on the dorsal aspect of the L hand.   MUSCLE TONE: Increased tone through the UE and hand flexors.   COGNITION: Overall cognitive status: Within functional limits for tasks assessed  VISION: Subjective report: L sided inattention Baseline vision: Wears glasses for reading only Visual history: Right visual occlusion  VISION ASSESSMENT: To be assessed  PERCEPTION: TBD  PRAXIS: Impaired                                                                                                                 TREATMENT DATE: 02/25/2024  Therapeutic Exercise:   -AROM/AAROM/PROM for the LUE including left shoulder flexion, abduction, horizontal abduction, elbow flexion, extension, forearm supination, pronation, wrist extension, digit MP/PIP/and DIP extension to increase ROM, and decrease stiffness   Therapeutic Activities:   -Facilitated diagonal patterns of movement with the LUE in  preparation for self-grooming tasks. -Facilitated Left FMC skills grasping 1.5 pegs using a lateral grasp with the left hand combined with reaching out to place them onto the flat tabletop surface.  -Pt. worked on placing the pegs onto, and removing the pegs from both a standard/slick, and nonskid surfaces. -Facilitated right hand grasp patterns, sustaining the grasp pattern in combination with reaching out further and actively releasing them  from the left hand into a bucket.  Neuromuscular re-education:   -Facilitated weightbearing, and proprioception through the RUE, and hand to normalize tone in the LUE and hand. -Promoted  alternating reps of weightbearing/proprioception with performing the task.   Education: Education details:  WB through the hand; strategies to engage the L hand as a stabilizer at work desk in  the home  Person educated: Patient and spouse Education method: Explanation, Demonstration, Tactile cues, and Verbal cues Education comprehension: verbalized understanding, further training needed  HOME EXERCISE PROGRAM: -grasping and stacking 1, and 1/2 cubes form the tabletop surface; yellow theraband for LUE strengthening   GOALS: Goals reviewed with patient? Yes  SHORT TERM GOALS: Target date: 02/11/2024   Pt. Will be independent with HEP for the LUE.  Baseline: 02/11/24: Independent 12/31/23: Independent; ongoing 12/03/23: indep with current HEP, though ongoing progressions are being made as UE function improves; 10/29/23: Pt. requires assist from wife with HEPs. Pt. Is consistently working on HEP's at home. 09/19/23: Requires assist from his wife Eval; no current HEP Goal status: Achieved,Ongoing  LONG TERM GOALS: Target date: 03/24/2024  Pt. Will increase L shoulder flexion by 10 degrees to be able to reach up to shelves. Baseline: 02/11/24: 100 before scaption (110) 12/31/23: Left: 95 degrees before scaption, 110 of scaption 12/03/23: 90* before onset of scaption; 10/29/23: 111(115) 09/19/23: Pt. Continues to be limited with reaching up to shelves. Eval: L shoulder flexion is 89 (110). Goal status: progressing, Ongoing  2.  Pt. Will increase L shoulder abduction by 10 degrees to assist with underarm hygiene.  Baseline: 02/11/24: 892(878) 12/31/23: Left: 894(881) 12/03/23: 100*; pt able to demo bilat underarm hygiene with set up; 10/29/23: 888(877) 09/19/23: Pt. Continues to require assist with underarm  hygiene.Eval: L shoulder abduction is 98 (120).  Goal status: Progressing, ongoing  3.  Pt. Will improve L wrist extension by 10 degrees to be able to initiate anticipation of grasping for objects.  Baseline: 02/11/24: 45(54) 12/31/23: 30(45) 12/03/23: 30* (45); 10/29/23: 20(55) 09/19/23: Consistent activation noted in the left wrist extensors with facilitation. Eval: -48 (42) Goal status: Ongoing  4.  Pt. Will improve active gross digit extension through 50% of the range to be able to release objects in his hand consistently.  Baseline:02/11/24: L digit extension: Active gross ext through 60% range 12/31/23: Gross digit extension: ~30-40% 12/03/23: Gross digit ext through ~30% of range (limited by flexor tone in the PIPs); 10/28/20: Gross digit extension through 25-50% of the range. 09/19/23: gross digit extension noted through 25% range with facilitation.Eval: gross digit extension is 10% of the range  Goal status: Ongoing  5.  Pt. Will independently demonstrate visual compensatory strategies when navigating through environment and during tabletop tasks. Baseline: 12/03/23: Pt demonstrates consistent head turns to scan to L visual field without cueing; 10/29/2023: Pt. Continues to less cuing for left sided awareness. 09/19/23: Pt. requires fewer cues for left sided awareness Eval: Pt. Requires consistent cuing for L sided awareness.  Goal status: achieved   6. Pt. Will improve left hand [facility] skills to be able to independently manipulate small objects during ADLs, and IADLs. Baseline: 02/11/24: 9 Hole Peg Test:1 min. & 18 sec. to remove 9 pegs.12/31/23: Pt. Is progressing to be able to grasp progressively smaller objects. 12/03/23: Pt removed 3 pegs in >3 min, pulling out more than 3 but repeatedly dropping to floor or table top rather than assessment dish; 10/29/23: 9 Hole Peg Test: Pt. Was able to remove 9 pegs from vertical position on the pegboard in 1 min. & 13 sec. Pt. Is not yet able to pick up pegs from a  horizontal position to place them in the pegboard.   Goal status: ongoing   7. Pt will improve left hand function skills as evidenced by improved score to 4 blocks on the Box and Blocks test. Baseline: 02/11/24 Box  and Blocks: 1 block in 1 min. 12/31/23: 4 blocks in 1 min. 12/03/23: 4 blocks after 3 trials and passive wrist and digit stretching between trials (not yet consistent to achieve 4 blocks); 10/29/23: Box and Blocks Test: 2 Blocks completed in 1 min.   Goal Status: ongoing   ASSESSMENT:  CLINICAL IMPRESSION:   Pt./caregiver report they have not heard from the [facility] team about the referral for the recommended surgeon. Pt/caregiver requested information about the surgeon at [facility]. Pt. was provided with the contact information to the [facility] regional rep. Pt. required assist to prepare the left forearm, wrist, and hand to assume a position of weightbearing, and proprioception. Pt. required manual assist and support at the left elbow, and when grasping, reaching, and placing the items into the container. Pt. presented with difficulty grasping the pegs off the slick tabletop surface, dropping multiple pegs initially. Pt. was able to improve accuracy as the reps increased. Pt. continues to benefit from OT services to work on improving LUE functioning to increase engagement of the LUE during daily tasks, and to provide education about compensatory strategies during ADLs/IADLs.   PERFORMANCE DEFICITS: in functional skills including ADLs, IADLs, coordination, dexterity, proprioception, sensation, edema, tone, ROM, strength, pain, Fine motor control, Gross motor control, mobility, balance, endurance, vision, and UE functional use, cognitive skills including perception, and psychosocial skills including coping strategies, environmental adaptation, and routines and behaviors.   IMPAIRMENTS: are limiting patient from ADLs, IADLs, and leisure.   CO-MORBIDITIES: may have co-morbidities  that affects  occupational performance. Patient will benefit from skilled OT to address above impairments and improve overall function.  MODIFICATION OR ASSISTANCE TO COMPLETE EVALUATION: Min-Moderate modification of tasks or assist with assess necessary to complete an evaluation.  OT OCCUPATIONAL PROFILE AND HISTORY: Detailed assessment: Review of records and additional review of physical, cognitive, psychosocial history related to current functional performance.  CLINICAL DECISION MAKING: Moderate - several treatment options, min-mod task modification necessary  REHAB POTENTIAL: Good  EVALUATION COMPLEXITY: Moderate  PLAN:  OT FREQUENCY: 2x/week  OT DURATION: 12 weeks  PLANNED INTERVENTIONS: 97168 OT Re-evaluation, 97535 self care/ADL training, 02889 therapeutic exercise, 97530 therapeutic activity, 97112 neuromuscular re-education, 97140 manual therapy, 97018 paraffin, 02989 moist heat, 97010 cryotherapy, 97034 contrast bath, 97760 Orthotic Initial, 97763 Orthotic/Prosthetic subsequent, passive range of motion, visual/perceptual remediation/compensation, energy conservation, and DME and/or AE instructions  RECOMMENDED OTHER SERVICES: PT and ST  CONSULTED AND AGREED WITH PLAN OF CARE: Patient and family member/caregiver  PLAN FOR NEXT SESSION: see above  Richardson Otter, MS, OTR/L   02/25/24, 5:00 PM

## 2024-02-27 ENCOUNTER — Encounter: Attending: Physician Assistant | Admitting: Physician Assistant

## 2024-02-27 ENCOUNTER — Ambulatory Visit: Admitting: Occupational Therapy

## 2024-02-27 ENCOUNTER — Ambulatory Visit: Admitting: Physical Therapy

## 2024-02-27 DIAGNOSIS — R278 Other lack of coordination: Secondary | ICD-10-CM

## 2024-02-27 DIAGNOSIS — I69354 Hemiplegia and hemiparesis following cerebral infarction affecting left non-dominant side: Secondary | ICD-10-CM

## 2024-02-27 DIAGNOSIS — I890 Lymphedema, not elsewhere classified: Secondary | ICD-10-CM | POA: Insufficient documentation

## 2024-02-27 DIAGNOSIS — L97811 Non-pressure chronic ulcer of other part of right lower leg limited to breakdown of skin: Secondary | ICD-10-CM | POA: Insufficient documentation

## 2024-02-27 DIAGNOSIS — M6281 Muscle weakness (generalized): Secondary | ICD-10-CM | POA: Diagnosis not present

## 2024-02-27 DIAGNOSIS — E11622 Type 2 diabetes mellitus with other skin ulcer: Secondary | ICD-10-CM | POA: Diagnosis present

## 2024-02-27 DIAGNOSIS — R269 Unspecified abnormalities of gait and mobility: Secondary | ICD-10-CM

## 2024-02-27 DIAGNOSIS — L97821 Non-pressure chronic ulcer of other part of left lower leg limited to breakdown of skin: Secondary | ICD-10-CM | POA: Insufficient documentation

## 2024-02-27 DIAGNOSIS — R262 Difficulty in walking, not elsewhere classified: Secondary | ICD-10-CM

## 2024-02-27 NOTE — Therapy (Signed)
 OUTPATIENT PHYSICAL THERAPY TREATMENT   Patient Name: [name]. MRN: [id number] DOB:1951-02-08, 73 y.o., male Today's Date: 02/27/2024 PCP: Dennise Remak, MD  REFERRING PROVIDER:  Pegge Toribio PARAS, PA-C  END OF SESSION:   PT End of Session - 02/27/24 1307     Visit Number 53    Number of Visits 64    Date for Recertification  03/24/24    Authorization Type [employer]    Authorization Time Period [facility] auth#; [id number] A for 16 PT vst from 10/09-01/02/2025    Authorization - Number of Visits 16    Progress Note Due on Visit 60    PT Start Time 1315    PT Stop Time 1355    PT Time Calculation (min) 40 min    Equipment Utilized During Treatment Gait belt    Activity Tolerance Patient tolerated treatment well;No increased pain    Behavior During Therapy WFL for tasks assessed/performed          Past Medical History:  Diagnosis Date   Anemia    Cervical myelopathy (HCC)    Chronic venous insufficiency    [name] (chronic kidney disease), stage III (HCC)    Diabetes mellitus without complication (HCC)    GERD (gastroesophageal reflux disease)    Hypercholesteremia    Hypothyroidism    Kidney stones    about 50 in the past--last in March 4/24   Leg weakness, bilateral    due to Myasthenia Gravis   Myasthenia gravis (HCC)    Spinal stenosis    Vitamin B 12 deficiency    Vitamin B 12 deficiency    Wears hearing aid    bilateral   Past Surgical History:  Procedure Laterality Date   BACK SURGERY  09/2015   CARDIAC CATHETERIZATION     CATARACT EXTRACTION W/ INTRAOCULAR LENS IMPLANT     CATARACT EXTRACTION W/PHACO Right 12/21/2015   Procedure: CATARACT EXTRACTION PHACO AND INTRAOCULAR LENS PLACEMENT (IOC);  Surgeon: Dene Etienne, MD;  Location: [facility];  Service: Ophthalmology;  Laterality: Right;  DIABETIC - insulin  and oral meds   COLONOSCOPY     SHOULDER SURGERY     labrum repair   TONSILLECTOMY     Patient Active Problem List   Diagnosis Date  Noted   Acquired left foot drop 01/10/2024   Left spastic hemiplegia (HCC) 01/10/2024   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) 10/11/2023   High blood pressure 08/13/2023   Anemia 08/13/2023   Constipation 08/13/2023   Myasthenia gravis (HCC) 08/13/2023   Acute ischemic right MCA stroke (HCC) 07/19/2023   Lymphedema 12/27/2016   Leg pain 07/15/2016   Chronic venous insufficiency 07/15/2016   Swelling of limb 07/15/2016   Type 2 diabetes mellitus (HCC) 07/15/2016   Hyperlipidemia 07/15/2016    ONSET DATE: 07/19/23  REFERRING DIAG:  P36.488 (ICD-10-CM) - Cerebral infarction due to unspecified occlusion or stenosis of right middle cerebral artery   THERAPY DIAG:  Muscle weakness (generalized)  Other lack of coordination  Hemiplegia and hemiparesis following cerebral infarction affecting left non-dominant side (HCC)  Difficulty in walking, not elsewhere classified  Abnormality of gait and mobility  Rationale for Evaluation and Treatment: Rehabilitation  SUBJECTIVE:  SUBJECTIVE STATEMENT:   Pt reports doing well and having received his new AFO two days ago.    From session on 01/14/2024: Patient states the MD suggested/recommended patient get an AFO for his L LE and patient has an appointment with [facility] the 2nd week of November for an assessment.     PERTINENT HISTORY:  CVA with Left hemiplegia in April 2025. Pt started on a swedish knee cage at CIR due to uncontrolled hyperextension in stance. Pt had not been moving much since discharging from CIR, as therapists instructed pt to perform transfers only at home due to high fall risk with ambulation. Continues to have significant weakenss in the LUE with difficulty extending fingers as well as  numbness and inattention to the L side of body resulting in multiple cuts on Arm and leg with WC mobility in home. Will be attaining custom power WC from [employer]. Has loaner currently.   PAIN:  Are you having pain? No  PRECAUTIONS: Fall, history of wounds on LLE   *Latex allergy   WEIGHT BEARING RESTRICTIONS: No  FALLS: Has patient fallen in last 6 months? Yes. Number of falls 1  LIVING ENVIRONMENT: Lives with: lives with their spouse Lives in: House/apartment Stairs: Ramps installed while in the Hospital  Has following equipment at home: Vannie - 2 wheeled and Wheelchair (power)  PLOF: Independent with basic ADLs, Independent with household mobility without device, and Independent with community mobility with device with [facility]   PATIENT GOALS: relearn to Walk 15-34ft.   OBJECTIVE:                                                                                                                              TREATMENT DATE: 02/27/2024  Pt arrives in transport chair.   TA- To improve functional movements patterns for everyday tasks   Donned lite gait harness - LiteGait harness applied with patient in standing position, RW used for stability. Straps secured snugly around torso and legs per manufacturer guidelines. Checked for proper alignment, comfort, and safety prior to standing and gait training. All buckles and fasteners verified secure; weight-bearing support adjusted to patient tolerance  Pt stepped onto Litegait without assistance and left UE secured with ace wrap  Gait training - activities focussed on specific components of gait cycle and multimodal cueing for completion  On litegait treadmill:  Gait 4 min, starting speed 0.56mph increasing by 0.1 each minute until 1.0 mph -Difficulty clearing foot requiring min-mod A when speed  increased to 0.9 mph  2 min standing rest  Gait 4 min, starting speed 0.74mph for 1 minute then increased to 0.9 for three minutes -Donned gait  assist elastic straps on lite gait to assist with foot clearance, occasional min A from SPT also required   2 min standing rest  Standing knee flexion on left LE x 10  -min A from SPT  Bwd gait/ reverse setting, 2 x at 0.2 mph for 2  minutes -Attempted to increase bwd gait speed to faciliate increased step length but dropped back down to 0.2 for improved posture    TA- To improve functional movements patterns for everyday tasks   Doffed lite gait harness- LiteGait harness removed with patient in standing position and transport chair behind. Due to increased abdominal pressure from harness, pt observed for dizziness post removal before transitioning to sitting for recovery.   Exited session in transport chair at OT session    PATIENT EDUCATION: Education details: knee cage needs assessmnet as it is causeing crossover gait.  Person educated: Patient and Spouse Education method: Explanation, VC/TC/Demo Education comprehension: verbalized understanding  HOME EXERCISE PROGRAM: Access Code: [email or web address] URL: [email or web address] Date: 01/14/2024 Prepared by: Connell Kiss  Exercises - Seated Knee Extension AROM  - 1 x daily - 4 x weekly - 3 sets - 10 reps - Seated Hip Abduction  - 1 x daily - 4 x weekly - 3 sets - 10 reps - Seated March  - 1 x daily - 4 x weekly - 3 sets - 10 reps - Sit to Stand with Counter Support  - 1 x daily - 4 x weekly - 3 sets - 10 reps - Supine Short Arc Quad  - 1 x daily - 4 x weekly - 3 sets - 10 reps - Supine Hip Abduction  - 1 x daily - 4 x weekly - 3 sets - 10 reps - Supine Bridge  - 1 x daily - 4 x weekly - 3 sets - 3 reps - 2sec  hold - Seated Hamstring Stretch with Strap  - 2-3 x daily - 7 x weekly - 2 sets - 30 second to 1 minute hold - Seated Knee Flexion Slide  - 2-3 x daily - 7 x weekly - 2 sets - 30 seconds to 1 minute hold  12/17/2023: verbal instruction to complete repeated sit<>stands with education on safe set-up  provided  GOALS: Goals reviewed with patient? Yes  SHORT TERM GOALS: Target date: 02/11/2024  Patient will be independent in home exercise program to improve strength/mobility for better functional independence with ADLs. Baseline: Initiated on 09/11/2023 10/22/2023: will update when appropriate 11/26/2023: patient participating in transfer and gait practice in controlled environment with 2 person assistance at home in addition to HEP 12/31/2023: patient participating in gait using bari-RW at home with family assistance Goal status: IN PROGRESS  LONG TERM GOALS: Target date: 03/24/2024  Patient will increase SIS-16 score to equal to or greater than 10points to demonstrate statistically significant improvement in mobility and quality of life.  Baseline: 49/80 09/19/2023: 45/80 10/22/2023: 46/80 11/26/2023: 47/80 12/31/2023: 53/80 Goal status: IN PROGRESS  2.  Patient (> 75 years old) will complete five times sit to stand test in < 15 seconds indicating an increased LE strength and improved balance. Baseline: 1:20min 09/19/2023: 56.39 seconds using R UE support to push-up from armrest of green chair and requiring skilled min A for balance and lifting/lowering  10/22/2023: 27.10 seconds using R UE support to push-up from armrest  8/12: 27.7 sec with RUE pushing from arm rest.  11/26/2023: 37.42 seconds using R UE support to push-up from armrest of green chair and not using L UE support with min-modA for facilitating anterior weight shift and lifting to come to stand secondary to fatigue (would benefit from re-testing at next session) 12/17/2023: 24.59 seconds using R UE support to push-up from armrest of green chair and not using L UE support with CGA/light min A for  safety/steadying  Goal status: IN PROGRESS  3.  Patient will increase FIST score by > 6 points to demonstrate decreased fall risk during functional activities Baseline:  43/56  09/23/2023: 53/56 Goal status: MET and UPGRADED   09/23/2023: Patient will increase Berg Balance score to > 45/56 to demonstrate improved balance and decreased fall risk during functional activities and ADLs.  Baseline: 09/23/2023: 14/56 7/31: 20/56 using RW support 11/26/2023: 17/56 without AD support 12/31/2023: 21/56 without AD support Goal status: IN PROGRESS  4.  Patient will increase 10 meter walk test to >0.67m/s as to improve gait speed for better community ambulation and to reduce fall risk. Baseline: 09/09/2023: 0.103 m/s ( and 37 seconds) using bari-RW with skilled heavy min A and +2 w/c follow for safety  09/19/2023: 0.102m/s using bari-RW with skilled min A of 1 and +2 w/c follow for safety 10/22/2023: 0.1875 m/s using bari-RW with L hand splint, L swedish knee cage, and only skilled light min A of 1 (no wheelchair follow)  8/12: 0.124m/s with RW, and L hand splint. No knee cage on this day.  11/26/2023: Average Normal speed: 0.20 m/s & Average Fast speed: 0.275 m/s using bari-RW with CGA/min A for balance wearing [facility] 12/17/2023: Average Normal speed: 0.32 m/s using bari-RW with CGA & Average Fast speed: 0.36 m/s using bari-RW requiring CGA/light min A  12/31/2023: Average Normal speed: 0.3275 m/s using bari-RW with CGA/light min A for steadying Goal status: IN PROGRESS  5.  Patient will reduce timed up and go to <11 seconds to reduce fall risk and demonstrate improved transfer/gait ability. Baseline: unable to complete turn at 10 ft. Mod-max assist due to poor control of LW on RW, resulting in L lateral LOB  09/09/2023: and 32 seconds using bari-RW with L hand splint and +2 on R side for safety, therapist providing skilled min A on L side (Had pt set-up L hand on RW splint, get L foot in proper positioning, and scoot forward in seat before starting the timer) 09/19/2023: 47min43 seconds using bari-RW with only skilled min A of 1 (Had pt set-up L hand on RW splint, get L foot in proper positioning, and scoot forward in seat before  starting the timer) 7/2: 78sec(1:18.125) with RW and min assist overall.  10/22/2023: 1 min, 30 seconds using bari-RW with L hand splint and skilled light min assist  8/12: 1:01 min with RW and L hand splint.  11/26/2023: 51.57 seconds using bari-RW with hand splint, wearing SKC, and CGA-minA for balance 12/31/2023: 43.88 seconds using bari-RW with hand splint, wearing SKC, and CGA/occasional light min A   Goal status: IN PROGRESS   ASSESSMENT:  CLINICAL IMPRESSION:   Pt arrived with good motivation for PT activities today. This session focused on gait with increased speed as well as retro gait on litegait treadmill. Pt demonstrated increased difficulty with clearing foot at this session especially when walking over 0.59mph. Explained to patient weakness in knee and hip flexion are contributing to this difficulty rather than new AFO. SPT assisted pt in standing knee flexion with left LE. Pt instructed on how to do this safely in a seat to encourage activation of hamstring. Assist elastic straps on lite gait was donned to help with foot clearance on second round of forward gait. Pt stated not feeling much a difference but assistance level from SPT was decreased. Session concluded with retro gait. Attempted to increase speed, but posture and step length were diminished with the increase. Pt will continue  to benefit from skilled therapy to address remaining deficits in order to improve overall QoL and return to PLOF.      OBJECTIVE IMPAIRMENTS: Abnormal gait, cardiopulmonary status limiting activity, decreased activity tolerance, decreased balance, decreased cognition, decreased coordination, decreased endurance, decreased knowledge of condition, decreased knowledge of use of DME, decreased mobility, difficulty walking, decreased ROM, decreased strength, decreased safety awareness, dizziness, hypomobility, increased fascial restrictions, impaired perceived functional ability, increased muscle spasms,  impaired sensation, impaired tone, impaired UE functional use, impaired vision/preception, improper body mechanics, postural dysfunction, and obesity.   ACTIVITY LIMITATIONS: carrying, lifting, bending, sitting, standing, squatting, stairs, transfers, bed mobility, bathing, toileting, dressing, reach over head, hygiene/grooming, locomotion level, and caring for others  PARTICIPATION LIMITATIONS: meal prep, cleaning, laundry, medication management, interpersonal relationship, driving, shopping, community activity, and yard work  PERSONAL FACTORS: Age, Past/current experiences, Time since onset of injury/illness/exacerbation, and 3+ comorbidities: MG, HTN, CVA, lymphedema are also affecting patient's functional outcome.   REHAB POTENTIAL: Good  CLINICAL DECISION MAKING: Unstable/unpredictable  EVALUATION COMPLEXITY: High  PLAN:  PT FREQUENCY: 1-2x/week  PT DURATION: 8 weeks  PLANNED INTERVENTIONS: 97164- PT Re-evaluation, 97750- Physical Performance Testing, 97110-Therapeutic exercises, 97530- Therapeutic activity, [id number]- Neuromuscular re-education, 97535- Self Care, [id number]- Manual therapy, [id number]- Gait training, [phone]- Orthotic Initial, [phone]- Orthotic/Prosthetic subsequent, [phone]- Canalith repositioning, [id number]- Electrical stimulation (unattended), [phone]- Electrical stimulation (manual), [id number]- Wound care (first 20 sq cm), 97598- Wound care (each additional 20 sq cm), Patient/Family education, Balance training, Stair training, Taping, Dry Needling, Joint mobilization, Joint manipulation, Vestibular training, Visual/preceptual remediation/compensation, Cognitive remediation, DME instructions, Wheelchair mobility training, Cryotherapy, and Moist heat  PLAN FOR NEXT SESSION:    Add STS to HEP HIGT (high intensity gait training) using litegait harness either on treadmill vs overground Continue overground gait training in litegait harness at next visit - decreasing reliance on R UE  support Continue stair navigation training Dynamic gait training using bari-RW vs litegait Backwards Side stepping Obstacle navigation (turning) Working on forward propulsion with increased L hip extension and forward progression of pelvis over L stance phase Dynamic standing balance of trunk rotation Continue monitoring SKC fit & see if posterior knee strap needs to be adjusted    Leonor Rode, SPT  02/27/24, 1:08 PM

## 2024-02-27 NOTE — Therapy (Signed)
 Outpatient Occupational Therapy Neuro Treatment Note  Patient Name: [name]. MRN: [id number] DOB:June 19, 1950, 73 y.o., male Today's Date: 02/27/2024  PCP: Alm Rower, MD REFERRING PROVIDER: Hermann Toribio PARAS, PA-C  END OF SESSION:  OT End of Session - 02/27/24 2307     Visit Number 54    Number of Visits 72    Date for Recertification  03/24/24    OT Start Time 1400    OT Stop Time 1445    OT Time Calculation (min) 45 min    Activity Tolerance Patient tolerated treatment well    Behavior During Therapy [facility] for tasks assessed/performed         Past Medical History:  Diagnosis Date   Anemia    Cervical myelopathy (HCC)    Chronic venous insufficiency    [name] (chronic kidney disease), stage III (HCC)    Diabetes mellitus without complication (HCC)    GERD (gastroesophageal reflux disease)    Hypercholesteremia    Hypothyroidism    Kidney stones    about 50 in the past--last in March 4/24   Leg weakness, bilateral    due to Myasthenia Gravis   Myasthenia gravis (HCC)    Spinal stenosis    Vitamin B 12 deficiency    Vitamin B 12 deficiency    Wears hearing aid    bilateral   Past Surgical History:  Procedure Laterality Date   BACK SURGERY  09/2015   CARDIAC CATHETERIZATION     CATARACT EXTRACTION W/ INTRAOCULAR LENS IMPLANT     CATARACT EXTRACTION W/PHACO Right 12/21/2015   Procedure: CATARACT EXTRACTION PHACO AND INTRAOCULAR LENS PLACEMENT (IOC);  Surgeon: Dene Etienne, MD;  Location: [facility];  Service: Ophthalmology;  Laterality: Right;  DIABETIC - insulin  and oral meds   COLONOSCOPY     SHOULDER SURGERY     labrum repair   TONSILLECTOMY     Patient Active Problem List   Diagnosis Date Noted   Acquired left foot drop 01/10/2024   Left spastic hemiplegia (HCC) 01/10/2024   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) 10/11/2023   High blood pressure 08/13/2023   Anemia 08/13/2023    Constipation 08/13/2023   Myasthenia gravis (HCC) 08/13/2023   Acute ischemic right MCA stroke (HCC) 07/19/2023   Lymphedema 12/27/2016   Leg pain 07/15/2016   Chronic venous insufficiency 07/15/2016   Swelling of limb 07/15/2016   Type 2 diabetes mellitus (HCC) 07/15/2016   Hyperlipidemia 07/15/2016   ONSET DATE: 07/13/2023  REFERRING DIAG: Acute ischemic R MCA CVA  THERAPY DIAG: muscle weakness (generalized), other lack of coordination, vision disturbance, hemiplegia and hemiparesis following cerebral infarction affecting left non-dominant side (HCC)  Rationale for Evaluation and Treatment: Rehabilitation  SUBJECTIVE:  SUBJECTIVE STATEMENT:  Pt. reports doing okay today.  Pt accompanied by: [name]  PERTINENT HISTORY: Pt. Was admitted to [facility] on 07/13/2023 after sustaining a CVA while on a trip to the beach. Pt. Was diagnosed with R MCA CVA. Pt. Was admitted to inpatient rehab from 07/19/2023-08/13/2023. Past medical history includes high BP, anemia, and Myasthenia Gravis.   PRECAUTIONS: None  WEIGHT BEARING RESTRICTIONS: No  PAIN: 02/27/24: No pain  FALLS: Has patient fallen in last 6 months? Yes  LIVING ENVIRONMENT: Lives with: lives with their family and lives with their spouse-  Lives in: House/apartment- 2 story home and resides mostly on the 1st floor Stairs: No- Ramped entrance Has following equipment at home:  Vannie, cane, [place]by, shower  chair, handheld shower head, bedside commode  PLOF: Independent and Independent with basic ADLs Independent at home   PATIENT GOALS: Would like to walk 10-20 feet each time. 01/16/24: Update: pt has restarted PT visits and will be continuing to focus on mobility in those sessions.  Pt continues to work towards improving functional use of the LUE during OT visits.   OBJECTIVE:  Note: Objective measures were completed at Evaluation unless otherwise noted.  HAND DOMINANCE: Right  ADLs: Overall ADLs:   Transfers/ambulation related to ADLs: Eating: Independent, 100% R handed Grooming: independent UB Dressing: Can put on shirt independently LB Dressing: Difficulty with pants and requires assistance getting the pants on his feet, has difficulty putting on shoes and socks Toileting: Tot A for toilet hygiene. Uses Steadi during toilet transfers. Bathing: Requires assist with using the L arm to wash the R arm. Tub Shower transfers: Roll in shower, built in shower chair, grab bars and hand held shower head.  IADLs: Shopping: Total A (Wife typically did the shopping prior to onset)   Light housekeeping: Total A (wife typically performs prior to onset) Meal Prep: Independent with light snack prep Community mobility: No driving, able to get in/out of the car Medication management: Set up assistance from his wife using a pill box (pt. Is responsible for taking medications at the correct time) Financial management: family members check behind him. Handwriting: TBD Hobbies: gardening, wood working and sports- [employer] Work history: Owns a tax business  MOBILITY STATUS: Needs Assist:    POSTURE COMMENTS:  Sitting balance: Good supported sitting balance  FUNCTIONAL OUTCOME MEASURES:  Fugl-Meyer: see below  UPPER EXTREMITY ROM:    Active ROM Right Eval  [facility] Right 09/23/2023 [facility] Left eval Left 09/23/2023 Left 10/29/23 Left 12/02/23 Left 12/31/23 Left 02/11/24  Shoulder flexion   89(110) 109(114) 111(115) 90 before scaption (110) 95 before scaption (110) 100 before scaption (110)  Shoulder abduction   98(120) 109(120) 111(122) 100 (110) 105(118) 107(121)  Shoulder adduction          Shoulder extension          Shoulder internal rotation          Shoulder external rotation          Elbow flexion   120(140) 130(136) 141(145)  146 146  Elbow extension   -28(-10) -5(0) -18(0)  -3(0) 0  Wrist flexion   60(60) Rest at 60 50(65) 54(60)     Wrist extension   -48(42) 10(40) 20(50) 30 (45) 30(45)  45(54)  Wrist ulnar deviation     12(22)     Wrist radial deviation     10(20)     Wrist pronation     (82)90   85  Wrist supination     64(74) 70 (75)  34(74)  (Blank rows = not tested)  Eval: L Digit flexion: 2nd:3cm (0cm) 3rd: 0cm (0cm), 4th: 0cm (0cm), 5th: 2cm (0cm)  09/23/23:  L Digit flexion to [facility]: 2nd: 3.5cm (0cm), 3rd: 4 cm(0cm), 4th 3.5cm (0cm), 5th: 2cm (0cm)  Eval:  L Digit extension: Active Gross digit extension through 10% of ROM  09/23/23:  L Digit extension: Active Gross digit extension through 50% of ROM  10/29/23:  L Digit extension: Active Gross digit extension through 25-50% of ROM  12/02/23:  L digit extension: Active gross ext through 30%   12/31/23:  L digit extension:  Active gross ext through ~30-40%   02/11/24:  Left full composite fist  L digit extension:  Active gross ext through 60% range  UPPER EXTREMITY MMT:     MMT Right eval Left eval Left 09/23/2023 Left 10/29/23 Left 12/02/23 Left 12/31/23 Left 02/11/24  Shoulder flexion 5 3- 3-/5 3-/5  3-/5 3-/5  Shoulder abduction 5 3- 3-/5 3-/5  3-5 3-/5  Shoulder adduction         Shoulder extension         Shoulder internal rotation         Shoulder external rotation         Middle trapezius         Lower trapezius         Elbow flexion 5 TBD 3+/5 4-/5 4+/5 4+/5 4+/5  Elbow extension 5 TBD 4-/5 3-/5 4+/5 4+/5 4+/5  Wrist flexion   2/5 3-/5 3-    Wrist extension  2- 2/5 2+/5 3- 3-/5 3-/5  Wrist ulnar deviation         Wrist radial deviation         Wrist pronation    3/5 3+  3+/5  Wrist supination    3-/5 3-  3-/5  (Blank rows = not tested)  HAND FUNCTION:  Eval:  Grip strength: Right: 94#, Left: 1#,  Lateral Key Pinch strength: Right: 14#, Left: 3#  10/29/23:  Grip strength: Left: 16# Lateral Key Pinch strength: Left: 5#  12/02/23: L: 5 lbs, R: 98 # Lateral pinch: Left: 5 lbs   12/31/23:   Grip strength: Right: 105#, Left: 14#,   02/11/24:   Grip strength: Left:  22#  COORDINATION: Box and Blocks: Right: 34 blocks completed in 1 min., Left: 0 blocks completed in 1 min.   10/29/23:  Box and Blocks: Right: 34 blocks completed in 1 min., Left: 2 blocks completed in 1 min.  9 Hole Peg Test: Left: To remove 9 pegs from a vertical position: 1 min. &13 sec.  12/02/23: Box and Blocks: 4 blocks 12/02/23: 4 pegs out in 3 min (multiple pulled and dropped out of the cup)   12/02/23: Box and Blocks Test: 5 blocks in 1 min.  02/10/24: Box and Blocks Test: 5 blocks in 1 min.  02/11/24: 1 min. & 18 sec. to remove 9 pegs.  Box and Blocks: 1 block in 1 min.   FUGL-MEYER ASSESSMENT   A. Upper Extremity   I. Reflex Activity:   12/12/23   Flexors (biceps) 2   Extensors (triceps) 2   Total 4/4                 II. Volitional Movement within Synergies:   12/12/23   Flexor Synergy:      Shoulder Elevation 2   Shoulder Retraction 2   Shoulder Abduction (at least 90*) 2   External Rotation 1   Elbow Flexion 2   Forearm Supination 1   Extensor Synergy:      Shoulder Adduction/Internal Rotation 2   Elbow Extension 1   Forearm Pronation 2   Total 15/18     III. Movement Combining Synergies:   12/12/23   Hand to Lumbar Spine 1   Shoulder Flexion at 90*, elbow at 0* 0   Pron/Sup w/elbow at 90* and shoulder at 0* 1   Total 2/6     IV. Movement Out of Synergy   12/12/23   Shoulder Abduction to 90*, elbow at 0*, forearm pronated 0   Shoulder Flexion 9)-180*, elbow at 0*, forearm in mid position 0   Pron/sup, elbow at 0* and shoulder  between 30-90* of flexion 0   Total 0/6       B. Wrist   12/12/23   Stability of wrist at 15* ext, elbow at 90*, shoulder 0* 2   Repeated flex/ext, elbow at 90*, shoulder 0* 1   Stability of wrist at 15* ext, elbow at 0*, shoulder 30* 0   Flex/ext, elbow 0*, shoulder 30* 0   Circumduction  1   Total 4/10     C. Hand   12/12/23   Mass Flexion 1   Mass Extension 1   Total 2/4       D. Grasp   12/12/23   Hook  0    Thumb Adduction - paper 1   Pincer - pen 0   Cylindrical - cup/can 0   Spherical - tennis ball 1   Total 2/10     E. Coordination/Speed   12/12/23   Tremor 2   Dysmetria 0   Time 1   Total 3/6     FUGL-MEYER ASSESSMENT: UPPER EXTREMITY A-E TOTAL on (date): 32/66   SENSATION: Sensation feels different in both hands.  L hand Light touch- impaired L hand Proprioception- impaired  EDEMA: Has had hx of edema, and came to Eval session with swelling in the L hand/wrist/forearm.   *pt. has a laceration from his dog on the dorsal aspect of the L hand.   MUSCLE TONE: Increased tone through the UE and hand flexors.   COGNITION: Overall cognitive status: Within functional limits for tasks assessed  VISION: Subjective report: L sided inattention Baseline vision: Wears glasses for reading only Visual history: Right visual occlusion  VISION ASSESSMENT: To be assessed  PERCEPTION: TBD  PRAXIS: Impaired                                                                                                                 TREATMENT DATE: 02/27/2024  Manual therapy:  -Facilitated left scapular mobilization for elevation, depression, and abduction/rotation in sitting, and sidelying to normalize tone, decrease tightness, and prepare for ROM.  -Manual therapy was performed independent of, and in preparation for therapeutic Ex.    Therapeutic Exercise:   -AROM/AAROM/PROM for the LUE including left shoulder flexion, abduction, horizontal abduction, elbow flexion, extension, forearm supination, pronation, wrist extension, digit MP/PIP/and DIP extension to increase ROM, and decrease stiffness   Therapeutic Activities:   -Facilitated diagonal patterns of movement with the LUE in  preparation for self-grooming tasks. -Facilitated Left FMC skills grasping 1.5 pegs using a lateral grasp with the left hand combined sustaining the grasp on the peg, then reaching out to place them onto an  [employer]. -Patient worked on removing the pegs from the pegboard and reaching out to the left to discard them into a container.  Education: Education details:  WB through the hand; strategies to engage the L hand as a stabilizer at work desk in the home  Person educated: Patient and spouse Education method: Explanation, Demonstration, Tactile cues, and Verbal cues Education comprehension: verbalized understanding, further  training needed  HOME EXERCISE PROGRAM: -grasping and stacking 1, and 1/2 cubes form the tabletop surface; yellow theraband for LUE strengthening   GOALS: Goals reviewed with patient? Yes  SHORT TERM GOALS: Target date: 02/11/2024   Pt. Will be independent with HEP for the LUE.  Baseline: 02/11/24: Independent 12/31/23: Independent; ongoing 12/03/23: indep with current HEP, though ongoing progressions are being made as UE function improves; 10/29/23: Pt. requires assist from wife with HEPs. Pt. Is consistently working on HEP's at home. 09/19/23: Requires assist from his wife Eval; no current HEP Goal status: Achieved,Ongoing  LONG TERM GOALS: Target date: 03/24/2024  Pt. Will increase L shoulder flexion by 10 degrees to be able to reach up to shelves. Baseline: 02/11/24: 100 before scaption (110) 12/31/23: Left: 95 degrees before scaption, 110 of scaption 12/03/23: 90* before onset of scaption; 10/29/23: 111(115) 09/19/23: Pt. Continues to be limited with reaching up to shelves. Eval: L shoulder flexion is 89 (110). Goal status: progressing, Ongoing  2.  Pt. Will increase L shoulder abduction by 10 degrees to assist with underarm hygiene.  Baseline: 02/11/24: 892(878) 12/31/23: Left: 894(881) 12/03/23: 100*; pt able to demo bilat underarm hygiene with set up; 10/29/23: 888(877) 09/19/23: Pt. Continues to require assist with underarm hygiene.Eval: L shoulder abduction is 98 (120).  Goal status: Progressing, ongoing  3.  Pt. Will improve L wrist extension by 10 degrees to  be able to initiate anticipation of grasping for objects.  Baseline: 02/11/24: 45(54) 12/31/23: 30(45) 12/03/23: 30* (45); 10/29/23: 20(55) 09/19/23: Consistent activation noted in the left wrist extensors with facilitation. Eval: -48 (42) Goal status: Ongoing  4.  Pt. Will improve active gross digit extension through 50% of the range to be able to release objects in his hand consistently.  Baseline:02/11/24: L digit extension: Active gross ext through 60% range 12/31/23: Gross digit extension: ~30-40% 12/03/23: Gross digit ext through ~30% of range (limited by flexor tone in the PIPs); 10/28/20: Gross digit extension through 25-50% of the range. 09/19/23: gross digit extension noted through 25% range with facilitation.Eval: gross digit extension is 10% of the range  Goal status: Ongoing  5.  Pt. Will independently demonstrate visual compensatory strategies when navigating through environment and during tabletop tasks. Baseline: 12/03/23: Pt demonstrates consistent head turns to scan to L visual field without cueing; 10/29/2023: Pt. Continues to less cuing for left sided awareness. 09/19/23: Pt. requires fewer cues for left sided awareness Eval: Pt. Requires consistent cuing for L sided awareness.  Goal status: achieved   6. Pt. Will improve left hand [facility] skills to be able to independently manipulate small objects during ADLs, and IADLs. Baseline: 02/11/24: 9 Hole Peg Test:1 min. & 18 sec. to remove 9 pegs.12/31/23: Pt. Is progressing to be able to grasp progressively smaller objects. 12/03/23: Pt removed 3 pegs in >3 min, pulling out more than 3 but repeatedly dropping to floor or table top rather than assessment dish; 10/29/23: 9 Hole Peg Test: Pt. Was able to remove 9 pegs from vertical position on the pegboard in 1 min. & 13 sec. Pt. Is not yet able to pick up pegs from a horizontal position to place them in the pegboard.   Goal status: ongoing   7. Pt will improve left hand function skills as evidenced by  improved score to 4 blocks on the Box and Blocks test. Baseline: 02/11/24 Box and Blocks: 1 block in 1 min. 12/31/23: 4 blocks in 1 min. 12/03/23: 4 blocks after 3 trials and passive wrist  and digit stretching between trials (not yet consistent to achieve 4 blocks); 10/29/23: Box and Blocks Test: 2 Blocks completed in 1 min.   Goal Status: ongoing   ASSESSMENT:  CLINICAL IMPRESSION:   Patient is able to consistently grasp the pegs from the therapist's hand, and place them onto the [facility] board with increased time required to complete the task. Patient presented with decreased motor control initially when attempting to place them onto the board, however it improved with repetition. Patient presented with difficulty using a lateral grasp to pick up the pegs from the pegboard in preparation for discarding them into the container. Pt. continues to benefit from OT services to work on improving LUE functioning to increase engagement of the LUE during daily tasks, and to provide education about compensatory strategies during ADLs/IADLs.   PERFORMANCE DEFICITS: in functional skills including ADLs, IADLs, coordination, dexterity, proprioception, sensation, edema, tone, ROM, strength, pain, Fine motor control, Gross motor control, mobility, balance, endurance, vision, and UE functional use, cognitive skills including perception, and psychosocial skills including coping strategies, environmental adaptation, and routines and behaviors.   IMPAIRMENTS: are limiting patient from ADLs, IADLs, and leisure.   CO-MORBIDITIES: may have co-morbidities  that affects occupational performance. Patient will benefit from skilled OT to address above impairments and improve overall function.  MODIFICATION OR ASSISTANCE TO COMPLETE EVALUATION: Min-Moderate modification of tasks or assist with assess necessary to complete an evaluation.  OT OCCUPATIONAL PROFILE AND HISTORY: Detailed assessment: Review of records and additional  review of physical, cognitive, psychosocial history related to current functional performance.  CLINICAL DECISION MAKING: Moderate - several treatment options, min-mod task modification necessary  REHAB POTENTIAL: Good  EVALUATION COMPLEXITY: Moderate  PLAN:  OT FREQUENCY: 2x/week  OT DURATION: 12 weeks  PLANNED INTERVENTIONS: 97168 OT Re-evaluation, 97535 self care/ADL training, 02889 therapeutic exercise, 97530 therapeutic activity, 97112 neuromuscular re-education, 97140 manual therapy, 97018 paraffin, 02989 moist heat, 97010 cryotherapy, 97034 contrast bath, 97760 Orthotic Initial, 97763 Orthotic/Prosthetic subsequent, passive range of motion, visual/perceptual remediation/compensation, energy conservation, and DME and/or AE instructions  RECOMMENDED OTHER SERVICES: PT and ST  CONSULTED AND AGREED WITH PLAN OF CARE: Patient and family member/caregiver  PLAN FOR NEXT SESSION: see above  Richardson Otter, MS, OTR/L   02/27/24, 11:12 PM

## 2024-03-03 ENCOUNTER — Ambulatory Visit

## 2024-03-03 DIAGNOSIS — R262 Difficulty in walking, not elsewhere classified: Secondary | ICD-10-CM

## 2024-03-03 DIAGNOSIS — M6281 Muscle weakness (generalized): Secondary | ICD-10-CM | POA: Diagnosis not present

## 2024-03-03 DIAGNOSIS — R278 Other lack of coordination: Secondary | ICD-10-CM

## 2024-03-03 DIAGNOSIS — I69354 Hemiplegia and hemiparesis following cerebral infarction affecting left non-dominant side: Secondary | ICD-10-CM

## 2024-03-03 DIAGNOSIS — R269 Unspecified abnormalities of gait and mobility: Secondary | ICD-10-CM

## 2024-03-03 DIAGNOSIS — R2681 Unsteadiness on feet: Secondary | ICD-10-CM

## 2024-03-03 NOTE — Therapy (Signed)
 OUTPATIENT PHYSICAL THERAPY TREATMENT   Patient Name: [name]. MRN: [id number] DOB:1950-12-16, 73 y.o., male Today's Date: 03/03/2024 PCP: Dennise Remak, MD  REFERRING PROVIDER:  Pegge Toribio PARAS, PA-C  END OF SESSION:   PT End of Session - 03/03/24 1315     Visit Number 54    Number of Visits 64    Date for Recertification  03/24/24    Authorization Type U[employer]    Authorization Time Period [facility] auth#; [id number] A for 16 PT vst from 10/09-01/02/2025    Authorization - Number of Visits 16    Progress Note Due on Visit 60    PT Start Time 1315    PT Stop Time 1409    PT Time Calculation (min) 54 min    Equipment Utilized During Treatment Gait belt    Activity Tolerance Patient tolerated treatment well;No increased pain    Behavior During Therapy WFL for tasks assessed/performed           Past Medical History:  Diagnosis Date   Anemia    Cervical myelopathy (HCC)    Chronic venous insufficiency    [name] (chronic kidney disease), stage III (HCC)    Diabetes mellitus without complication (HCC)    GERD (gastroesophageal reflux disease)    Hypercholesteremia    Hypothyroidism    Kidney stones    about 50 in the past--last in March 4/24   Leg weakness, bilateral    due to Myasthenia Gravis   Myasthenia gravis (HCC)    Spinal stenosis    Vitamin B 12 deficiency    Vitamin B 12 deficiency    Wears hearing aid    bilateral   Past Surgical History:  Procedure Laterality Date   BACK SURGERY  09/2015   CARDIAC CATHETERIZATION     CATARACT EXTRACTION W/ INTRAOCULAR LENS IMPLANT     CATARACT EXTRACTION W/PHACO Right 12/21/2015   Procedure: CATARACT EXTRACTION PHACO AND INTRAOCULAR LENS PLACEMENT (IOC);  Surgeon: Dene Etienne, MD;  Location: [facility];  Service: Ophthalmology;  Laterality: Right;  DIABETIC - insulin  and oral meds   COLONOSCOPY     SHOULDER SURGERY     labrum repair   TONSILLECTOMY     Patient Active Problem List   Diagnosis  Date Noted   Acquired left foot drop 01/10/2024   Left spastic hemiplegia (HCC) 01/10/2024   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) 10/11/2023   High blood pressure 08/13/2023   Anemia 08/13/2023   Constipation 08/13/2023   Myasthenia gravis (HCC) 08/13/2023   Acute ischemic right MCA stroke (HCC) 07/19/2023   Lymphedema 12/27/2016   Leg pain 07/15/2016   Chronic venous insufficiency 07/15/2016   Swelling of limb 07/15/2016   Type 2 diabetes mellitus (HCC) 07/15/2016   Hyperlipidemia 07/15/2016    ONSET DATE: 07/19/23  REFERRING DIAG:  P36.488 (ICD-10-CM) - Cerebral infarction due to unspecified occlusion or stenosis of right middle cerebral artery   THERAPY DIAG:  Muscle weakness (generalized)  Other lack of coordination  Hemiplegia and hemiparesis following cerebral infarction affecting left non-dominant side (HCC)  Difficulty in walking, not elsewhere classified  Abnormality of gait and mobility  Unsteadiness on feet  Rationale for Evaluation and Treatment: Rehabilitation  SUBJECTIVE:  SUBJECTIVE STATEMENT:   Pt notes that he may be on his last few visits and would like to walk with the walker if possible due to the limited visits.     From session on 01/14/2024: Patient states the MD suggested/recommended patient get an AFO for his L LE and patient has an appointment with [facility] the 2nd week of November for an assessment.     PERTINENT HISTORY:  CVA with Left hemiplegia in April 2025. Pt started on a swedish knee cage at CIR due to uncontrolled hyperextension in stance. Pt had not been moving much since discharging from CIR, as therapists instructed pt to perform transfers only at home due to high fall risk with ambulation.  Continues to have significant weakenss in the LUE with difficulty extending fingers as well as numbness and inattention to the L side of body resulting in multiple cuts on Arm and leg with WC mobility in home. Will be attaining custom power WC from [employer]. Has loaner currently.   PAIN:  Are you having pain? No  PRECAUTIONS: Fall, history of wounds on LLE   *Latex allergy   WEIGHT BEARING RESTRICTIONS: No  FALLS: Has patient fallen in last 6 months? Yes. Number of falls 1  LIVING ENVIRONMENT: Lives with: lives with their spouse Lives in: House/apartment Stairs: Ramps installed while in the Hospital  Has following equipment at home: Vannie - 2 wheeled and Wheelchair (power)  PLOF: Independent with basic ADLs, Independent with household mobility without device, and Independent with community mobility with device with [facility]   PATIENT GOALS: relearn to Walk 15-96ft.   OBJECTIVE:                                                                                                                              TREATMENT DATE: 03/03/2024   Pt arrives in transport chair.   TA- To improve functional movements patterns for everyday tasks   Donned lite gait harness - LiteGait harness applied with patient in standing position, RW used for stability. Straps secured snugly around torso and legs per manufacturer guidelines. Checked for proper alignment, comfort, and safety prior to standing and gait training. All buckles and fasteners verified secure; weight-bearing support adjusted to patient tolerance  Pt then assisted with turning around in order to utilize the walker with the hand support/strap to keep the L UE in place.  Litegait utilized just for [employer] - activities focussed on specific components of gait cycle and multimodal cueing for completion  Gait training overground; the following trials completed in litegait harness for safety, but not providing BWS (body weight support),  with therapist assist for driving litegait.  Forward/Retro ambulation within the main therapy hallway before transitioning to ambulating around the gym, down EVS hallway, past cafeteria, down main hallway towards rehab area.  Pt unable to complete total lap of 500' and was short ~16' due to fatigue and inability to advance the L  LE, more specifically the L knee.  Pt required more frequent standing rest breaks, throughout the attempt due to the L LE being fatigued and unable to progress forward as well.   TA- To improve functional movements patterns for everyday tasks   Doffed lite gait harness- LiteGait harness removed with patient in standing position and transport chair behind. Due to increased abdominal pressure from harness, pt observed for dizziness post removal before transitioning to sitting for recovery.   Exited session in transport chair in care of OT    PATIENT EDUCATION: Education details: knee cage needs assessmnet as it is causeing crossover gait.  Person educated: Patient and Spouse Education method: Explanation, VC/TC/Demo Education comprehension: verbalized understanding  HOME EXERCISE PROGRAM: Access Code: [email or web address] URL: [email or web address] Date: 01/14/2024 Prepared by: Connell Kiss  Exercises - Seated Knee Extension AROM  - 1 x daily - 4 x weekly - 3 sets - 10 reps - Seated Hip Abduction  - 1 x daily - 4 x weekly - 3 sets - 10 reps - Seated March  - 1 x daily - 4 x weekly - 3 sets - 10 reps - Sit to Stand with Counter Support  - 1 x daily - 4 x weekly - 3 sets - 10 reps - Supine Short Arc Quad  - 1 x daily - 4 x weekly - 3 sets - 10 reps - Supine Hip Abduction  - 1 x daily - 4 x weekly - 3 sets - 10 reps - Supine Bridge  - 1 x daily - 4 x weekly - 3 sets - 3 reps - 2sec  hold - Seated Hamstring Stretch with Strap  - 2-3 x daily - 7 x weekly - 2 sets - 30 second to 1 minute hold - Seated Knee Flexion Slide  - 2-3 x daily - 7 x weekly - 2 sets - 30  seconds to 1 minute hold  12/17/2023: verbal instruction to complete repeated sit<>stands with education on safe set-up provided  GOALS: Goals reviewed with patient? Yes  SHORT TERM GOALS: Target date: 02/11/2024  Patient will be independent in home exercise program to improve strength/mobility for better functional independence with ADLs. Baseline: Initiated on 09/11/2023 10/22/2023: will update when appropriate 11/26/2023: patient participating in transfer and gait practice in controlled environment with 2 person assistance at home in addition to HEP 12/31/2023: patient participating in gait using bari-RW at home with family assistance Goal status: IN PROGRESS  LONG TERM GOALS: Target date: 03/24/2024  Patient will increase SIS-16 score to equal to or greater than 10points to demonstrate statistically significant improvement in mobility and quality of life.  Baseline: 49/80 09/19/2023: 45/80 10/22/2023: 46/80 11/26/2023: 47/80 12/31/2023: 53/80 Goal status: IN PROGRESS  2.  Patient (> 55 years old) will complete five times sit to stand test in < 15 seconds indicating an increased LE strength and improved balance. Baseline: 1:58min 09/19/2023: 56.39 seconds using R UE support to push-up from armrest of green chair and requiring skilled min A for balance and lifting/lowering  10/22/2023: 27.10 seconds using R UE support to push-up from armrest  8/12: 27.7 sec with RUE pushing from arm rest.  11/26/2023: 37.42 seconds using R UE support to push-up from armrest of green chair and not using L UE support with min-modA for facilitating anterior weight shift and lifting to come to stand secondary to fatigue (would benefit from re-testing at next session) 12/17/2023: 24.59 seconds using R UE support to push-up from armrest of green chair and  not using L UE support with CGA/light min A for safety/steadying  Goal status: IN PROGRESS  3.  Patient will increase FIST score by > 6 points to demonstrate  decreased fall risk during functional activities Baseline:  43/56  09/23/2023: 53/56 Goal status: MET and UPGRADED  09/23/2023: Patient will increase Berg Balance score to > 45/56 to demonstrate improved balance and decreased fall risk during functional activities and ADLs.  Baseline: 09/23/2023: 14/56 7/31: 20/56 using RW support 11/26/2023: 17/56 without AD support 12/31/2023: 21/56 without AD support Goal status: IN PROGRESS  4.  Patient will increase 10 meter walk test to >0.74m/s as to improve gait speed for better community ambulation and to reduce fall risk. Baseline: 09/09/2023: 0.103 m/s ( and 37 seconds) using bari-RW with skilled heavy min A and +2 w/c follow for safety  09/19/2023: 0.39m/s using bari-RW with skilled min A of 1 and +2 w/c follow for safety 10/22/2023: 0.1875 m/s using bari-RW with L hand splint, L swedish knee cage, and only skilled light min A of 1 (no wheelchair follow)  8/12: 0.128m/s with RW, and L hand splint. No knee cage on this day.  11/26/2023: Average Normal speed: 0.20 m/s & Average Fast speed: 0.275 m/s using bari-RW with CGA/min A for balance wearing [employer] 12/17/2023: Average Normal speed: 0.32 m/s using bari-RW with CGA & Average Fast speed: 0.36 m/s using bari-RW requiring CGA/light min A  12/31/2023: Average Normal speed: 0.3275 m/s using bari-RW with CGA/light min A for steadying Goal status: IN PROGRESS  5.  Patient will reduce timed up and go to <11 seconds to reduce fall risk and demonstrate improved transfer/gait ability. Baseline: unable to complete turn at 10 ft. Mod-max assist due to poor control of LW on RW, resulting in L lateral LOB  09/09/2023: and 32 seconds using bari-RW with L hand splint and +2 on R side for safety, therapist providing skilled min A on L side (Had pt set-up L hand on RW splint, get L foot in proper positioning, and scoot forward in seat before starting the timer) 09/19/2023: 57min43 seconds using bari-RW with only skilled min  A of 1 (Had pt set-up L hand on RW splint, get L foot in proper positioning, and scoot forward in seat before starting the timer) 7/2: 78sec(1:18.125) with RW and min assist overall.  10/22/2023: 1 min, 30 seconds using bari-RW with L hand splint and skilled light min assist  8/12: 1:01 min with RW and L hand splint.  11/26/2023: 51.57 seconds using bari-RW with hand splint, wearing SKC, and CGA-minA for balance 12/31/2023: 43.88 seconds using bari-RW with hand splint, wearing SKC, and CGA/occasional light min A   Goal status: IN PROGRESS   ASSESSMENT:  CLINICAL IMPRESSION:   Pt elected to attempt more ambulation with the walker today, and in doing so, pt became fatigued much more quickly and was unable to ambulate as far or as quickly as he normally does.  Pt still performed well and was motivated to complete, however pt ran out of time with the reduced cadence during the session today.  Next visit may be the pt's last due to approval by insurance, and continuation of ambulation with the walker will be beneficial.   Pt will continue to benefit from skilled therapy to address remaining deficits in order to improve overall QoL and return to PLOF.        OBJECTIVE IMPAIRMENTS: Abnormal gait, cardiopulmonary status limiting activity, decreased activity tolerance, decreased balance, decreased cognition, decreased coordination,  decreased endurance, decreased knowledge of condition, decreased knowledge of use of DME, decreased mobility, difficulty walking, decreased ROM, decreased strength, decreased safety awareness, dizziness, hypomobility, increased fascial restrictions, impaired perceived functional ability, increased muscle spasms, impaired sensation, impaired tone, impaired UE functional use, impaired vision/preception, improper body mechanics, postural dysfunction, and obesity.   ACTIVITY LIMITATIONS: carrying, lifting, bending, sitting, standing, squatting, stairs, transfers, bed mobility, bathing,  toileting, dressing, reach over head, hygiene/grooming, locomotion level, and caring for others  PARTICIPATION LIMITATIONS: meal prep, cleaning, laundry, medication management, interpersonal relationship, driving, shopping, community activity, and yard work  PERSONAL FACTORS: Age, Past/current experiences, Time since onset of injury/illness/exacerbation, and 3+ comorbidities: MG, HTN, CVA, lymphedema are also affecting patient's functional outcome.   REHAB POTENTIAL: Good  CLINICAL DECISION MAKING: Unstable/unpredictable  EVALUATION COMPLEXITY: High  PLAN:  PT FREQUENCY: 1-2x/week  PT DURATION: 8 weeks  PLANNED INTERVENTIONS: [id number]- PT Re-evaluation, 97750- Physical Performance Testing, 97110-Therapeutic exercises, 97530- Therapeutic activity, [id number]- Neuromuscular re-education, 97535- Self Care, [id number]- Manual therapy, [id number]- Gait training, [phone]- Orthotic Initial, [phone]- Orthotic/Prosthetic subsequent, [phone]- Canalith repositioning, [id number]- Electrical stimulation (unattended), [phone]- Electrical stimulation (manual), [id number]- Wound care (first 20 sq cm), 97598- Wound care (each additional 20 sq cm), Patient/Family education, Balance training, Stair training, Taping, Dry Needling, Joint mobilization, Joint manipulation, Vestibular training, Visual/preceptual remediation/compensation, Cognitive remediation, DME instructions, Wheelchair mobility training, Cryotherapy, and Moist heat  PLAN FOR NEXT SESSION:     Add STS to HEP HIGT (high intensity gait training) using litegait harness either on treadmill vs overground Continue overground gait training in litegait harness at next visit - decreasing reliance on R UE support Continue stair navigation training Dynamic gait training using bari-RW vs litegait Backwards Side stepping Obstacle navigation (turning) Working on forward propulsion with increased L hip extension and forward progression of pelvis over L stance phase Dynamic standing balance  of trunk rotation Continue monitoring SKC fit & see if posterior knee strap needs to be adjusted    Fonda Simpers, PT, DPT Physical Therapist - [facility]  03/03/24, 6:02 PM

## 2024-03-05 ENCOUNTER — Ambulatory Visit

## 2024-03-05 DIAGNOSIS — M6281 Muscle weakness (generalized): Secondary | ICD-10-CM

## 2024-03-05 DIAGNOSIS — R2681 Unsteadiness on feet: Secondary | ICD-10-CM

## 2024-03-05 DIAGNOSIS — R262 Difficulty in walking, not elsewhere classified: Secondary | ICD-10-CM

## 2024-03-05 DIAGNOSIS — R278 Other lack of coordination: Secondary | ICD-10-CM

## 2024-03-05 DIAGNOSIS — I69354 Hemiplegia and hemiparesis following cerebral infarction affecting left non-dominant side: Secondary | ICD-10-CM

## 2024-03-05 DIAGNOSIS — R269 Unspecified abnormalities of gait and mobility: Secondary | ICD-10-CM

## 2024-03-05 NOTE — Therapy (Signed)
 OUTPATIENT PHYSICAL THERAPY TREATMENT   Patient Name: [name]. MRN: [id number] DOB:1950-09-20, 73 y.o., male Today's Date: 03/05/2024 PCP: Dennise Remak, MD  REFERRING PROVIDER:  Pegge Toribio PARAS, PA-C  END OF SESSION:   PT End of Session - 03/05/24 1449     Visit Number 55    Number of Visits 64    Date for Recertification  03/24/24    Authorization Type U[employer]    Authorization Time Period [facility] auth#; [id number] A for 16 PT vst from 10/09-01/02/2025    Authorization - Number of Visits 16    Progress Note Due on Visit 60    PT Start Time 1449    PT Stop Time 1530    PT Time Calculation (min) 41 min    Equipment Utilized During Treatment Gait belt    Activity Tolerance Patient tolerated treatment well;No increased pain    Behavior During Therapy WFL for tasks assessed/performed            Past Medical History:  Diagnosis Date   Anemia    Cervical myelopathy (HCC)    Chronic venous insufficiency    [name] (chronic kidney disease), stage III (HCC)    Diabetes mellitus without complication (HCC)    GERD (gastroesophageal reflux disease)    Hypercholesteremia    Hypothyroidism    Kidney stones    about 50 in the past--last in March 4/24   Leg weakness, bilateral    due to Myasthenia Gravis   Myasthenia gravis (HCC)    Spinal stenosis    Vitamin B 12 deficiency    Vitamin B 12 deficiency    Wears hearing aid    bilateral   Past Surgical History:  Procedure Laterality Date   BACK SURGERY  09/2015   CARDIAC CATHETERIZATION     CATARACT EXTRACTION W/ INTRAOCULAR LENS IMPLANT     CATARACT EXTRACTION W/PHACO Right 12/21/2015   Procedure: CATARACT EXTRACTION PHACO AND INTRAOCULAR LENS PLACEMENT (IOC);  Surgeon: Dene Etienne, MD;  Location: [facility];  Service: Ophthalmology;  Laterality: Right;  DIABETIC - insulin  and oral meds   COLONOSCOPY     SHOULDER SURGERY     labrum repair   TONSILLECTOMY     Patient Active Problem List   Diagnosis  Date Noted   Acquired left foot drop 01/10/2024   Left spastic hemiplegia (HCC) 01/10/2024   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) 10/11/2023   High blood pressure 08/13/2023   Anemia 08/13/2023   Constipation 08/13/2023   Myasthenia gravis (HCC) 08/13/2023   Acute ischemic right MCA stroke (HCC) 07/19/2023   Lymphedema 12/27/2016   Leg pain 07/15/2016   Chronic venous insufficiency 07/15/2016   Swelling of limb 07/15/2016   Type 2 diabetes mellitus (HCC) 07/15/2016   Hyperlipidemia 07/15/2016    ONSET DATE: 07/19/23  REFERRING DIAG:  P36.488 (ICD-10-CM) - Cerebral infarction due to unspecified occlusion or stenosis of right middle cerebral artery   THERAPY DIAG:  Muscle weakness (generalized)  Other lack of coordination  Hemiplegia and hemiparesis following cerebral infarction affecting left non-dominant side (HCC)  Difficulty in walking, not elsewhere classified  Abnormality of gait and mobility  Unsteadiness on feet  Rationale for Evaluation and Treatment: Rehabilitation  SUBJECTIVE:  SUBJECTIVE STATEMENT:   Pt reports he was slap wore out the last visit.     From session on 01/14/2024: Patient states the MD suggested/recommended patient get an AFO for his L LE and patient has an appointment with [facility] the 2nd week of November for an assessment.     PERTINENT HISTORY:  CVA with Left hemiplegia in April 2025. Pt started on a swedish knee cage at CIR due to uncontrolled hyperextension in stance. Pt had not been moving much since discharging from CIR, as therapists instructed pt to perform transfers only at home due to high fall risk with ambulation. Continues to have significant weakenss in the LUE with difficulty extending  fingers as well as numbness and inattention to the L side of body resulting in multiple cuts on Arm and leg with WC mobility in home. Will be attaining custom power WC from [employer]. Has loaner currently.   PAIN:  Are you having pain? No  PRECAUTIONS: Fall, history of wounds on LLE   *Latex allergy   WEIGHT BEARING RESTRICTIONS: No  FALLS: Has patient fallen in last 6 months? Yes. Number of falls 1  LIVING ENVIRONMENT: Lives with: lives with their spouse Lives in: House/apartment Stairs: Ramps installed while in the Hospital  Has following equipment at home: Vannie - 2 wheeled and Wheelchair (power)  PLOF: Independent with basic ADLs, Independent with household mobility without device, and Independent with community mobility with device with [facility]   PATIENT GOALS: relearn to Walk 15-64ft.   OBJECTIVE:                                                                                                                              TREATMENT DATE: 03/05/2024   Pt arrives in transport chair.   TA- To improve functional movements patterns for everyday tasks   Donned lite gait harness - LiteGait harness applied with patient in standing position, RW used for stability. Straps secured snugly around torso and legs per manufacturer guidelines. Checked for proper alignment, comfort, and safety prior to standing and gait training. All buckles and fasteners verified secure; weight-bearing support adjusted to patient tolerance  Pt then assisted with turning around in order to utilize the walker with the hand support/strap to keep the L UE in place.  Litegait utilized just for [employer] - activities focussed on specific components of gait cycle and multimodal cueing for completion  Gait training overground; the following trials completed in litegait harness for safety, but not providing BWS (body weight support), with therapist assist for driving litegait.  Overground ambulation within  the main therapy hallway then ambulating around the gym, down EVS hallway, past cafeteria, down main hallway towards rehab area.  Pt performed approximately 1/2 (250') of the lap with use of the walker, then transitioned to utilizing frontward facing with the LiteGait to perform the rest of it.  Pt required less rest breaks compared to the last visit.  TA- To improve functional movements patterns for everyday tasks   Doffed lite gait harness- LiteGait harness removed with patient in standing position and transport chair behind. Due to increased abdominal pressure from harness, pt observed for dizziness post removal before transitioning to sitting for recovery.   Exited session in transport chair in care of OT    PATIENT EDUCATION: Education details: knee cage needs assessmnet as it is causeing crossover gait.  Person educated: Patient and Spouse Education method: Explanation, VC/TC/Demo Education comprehension: verbalized understanding  HOME EXERCISE PROGRAM: Access Code: [email or web address] URL: [email or web address] Date: 01/14/2024 Prepared by: Connell Kiss  Exercises - Seated Knee Extension AROM  - 1 x daily - 4 x weekly - 3 sets - 10 reps - Seated Hip Abduction  - 1 x daily - 4 x weekly - 3 sets - 10 reps - Seated March  - 1 x daily - 4 x weekly - 3 sets - 10 reps - Sit to Stand with Counter Support  - 1 x daily - 4 x weekly - 3 sets - 10 reps - Supine Short Arc Quad  - 1 x daily - 4 x weekly - 3 sets - 10 reps - Supine Hip Abduction  - 1 x daily - 4 x weekly - 3 sets - 10 reps - Supine Bridge  - 1 x daily - 4 x weekly - 3 sets - 3 reps - 2sec  hold - Seated Hamstring Stretch with Strap  - 2-3 x daily - 7 x weekly - 2 sets - 30 second to 1 minute hold - Seated Knee Flexion Slide  - 2-3 x daily - 7 x weekly - 2 sets - 30 seconds to 1 minute hold  12/17/2023: verbal instruction to complete repeated sit<>stands with education on safe set-up provided  GOALS: Goals reviewed  with patient? Yes  SHORT TERM GOALS: Target date: 02/11/2024  Patient will be independent in home exercise program to improve strength/mobility for better functional independence with ADLs. Baseline: Initiated on 09/11/2023 10/22/2023: will update when appropriate 11/26/2023: patient participating in transfer and gait practice in controlled environment with 2 person assistance at home in addition to HEP 12/31/2023: patient participating in gait using bari-RW at home with family assistance Goal status: IN PROGRESS  LONG TERM GOALS: Target date: 03/24/2024  Patient will increase SIS-16 score to equal to or greater than 10points to demonstrate statistically significant improvement in mobility and quality of life.  Baseline: 49/80 09/19/2023: 45/80 10/22/2023: 46/80 11/26/2023: 47/80 12/31/2023: 53/80 Goal status: IN PROGRESS  2.  Patient (> 77 years old) will complete five times sit to stand test in < 15 seconds indicating an increased LE strength and improved balance. Baseline: 1:80min 09/19/2023: 56.39 seconds using R UE support to push-up from armrest of green chair and requiring skilled min A for balance and lifting/lowering  10/22/2023: 27.10 seconds using R UE support to push-up from armrest  8/12: 27.7 sec with RUE pushing from arm rest.  11/26/2023: 37.42 seconds using R UE support to push-up from armrest of green chair and not using L UE support with min-modA for facilitating anterior weight shift and lifting to come to stand secondary to fatigue (would benefit from re-testing at next session) 12/17/2023: 24.59 seconds using R UE support to push-up from armrest of green chair and not using L UE support with CGA/light min A for safety/steadying  Goal status: IN PROGRESS  3.  Patient will increase FIST score by > 6 points to demonstrate decreased fall risk  during functional activities Baseline:  43/56  09/23/2023: 53/56 Goal status: MET and UPGRADED  09/23/2023: Patient will increase Berg  Balance score to > 45/56 to demonstrate improved balance and decreased fall risk during functional activities and ADLs.  Baseline: 09/23/2023: 14/56 7/31: 20/56 using RW support 11/26/2023: 17/56 without AD support 12/31/2023: 21/56 without AD support Goal status: IN PROGRESS  4.  Patient will increase 10 meter walk test to >0.46m/s as to improve gait speed for better community ambulation and to reduce fall risk. Baseline: 09/09/2023: 0.103 m/s ( and 37 seconds) using bari-RW with skilled heavy min A and +2 w/c follow for safety  09/19/2023: 0.26m/s using bari-RW with skilled min A of 1 and +2 w/c follow for safety 10/22/2023: 0.1875 m/s using bari-RW with L hand splint, L swedish knee cage, and only skilled light min A of 1 (no wheelchair follow)  8/12: 0.161m/s with RW, and L hand splint. No knee cage on this day.  11/26/2023: Average Normal speed: 0.20 m/s & Average Fast speed: 0.275 m/s using bari-RW with CGA/min A for balance wearing [facility] 12/17/2023: Average Normal speed: 0.32 m/s using bari-RW with CGA & Average Fast speed: 0.36 m/s using bari-RW requiring CGA/light min A  12/31/2023: Average Normal speed: 0.3275 m/s using bari-RW with CGA/light min A for steadying Goal status: IN PROGRESS  5.  Patient will reduce timed up and go to <11 seconds to reduce fall risk and demonstrate improved transfer/gait ability. Baseline: unable to complete turn at 10 ft. Mod-max assist due to poor control of LW on RW, resulting in L lateral LOB  09/09/2023: and 32 seconds using bari-RW with L hand splint and +2 on R side for safety, therapist providing skilled min A on L side (Had pt set-up L hand on RW splint, get L foot in proper positioning, and scoot forward in seat before starting the timer) 09/19/2023: 83min43 seconds using bari-RW with only skilled min A of 1 (Had pt set-up L hand on RW splint, get L foot in proper positioning, and scoot forward in seat before starting the timer) 7/2: 78sec(1:18.125) with  RW and min assist overall.  10/22/2023: 1 min, 30 seconds using bari-RW with L hand splint and skilled light min assist  8/12: 1:01 min with RW and L hand splint.  11/26/2023: 51.57 seconds using bari-RW with hand splint, wearing SKC, and CGA-minA for balance 12/31/2023: 43.88 seconds using bari-RW with hand splint, wearing SKC, and CGA/occasional light min A   Goal status: IN PROGRESS   ASSESSMENT:  CLINICAL IMPRESSION:   Pt responded better to the treatment session today and noted it may be due to the positioning of the LiteGait harness.  Pt noted that due to it being lower, it allowed for him to weight shift better and have more trunk rotation when walking.  Pt will continue to benefit from therapy services to improve gait mechanics and endurance levels with ambulation as well.     OBJECTIVE IMPAIRMENTS: Abnormal gait, cardiopulmonary status limiting activity, decreased activity tolerance, decreased balance, decreased cognition, decreased coordination, decreased endurance, decreased knowledge of condition, decreased knowledge of use of DME, decreased mobility, difficulty walking, decreased ROM, decreased strength, decreased safety awareness, dizziness, hypomobility, increased fascial restrictions, impaired perceived functional ability, increased muscle spasms, impaired sensation, impaired tone, impaired UE functional use, impaired vision/preception, improper body mechanics, postural dysfunction, and obesity.   ACTIVITY LIMITATIONS: carrying, lifting, bending, sitting, standing, squatting, stairs, transfers, bed mobility, bathing, toileting, dressing, reach over head, hygiene/grooming, locomotion level, and  caring for others  PARTICIPATION LIMITATIONS: meal prep, cleaning, laundry, medication management, interpersonal relationship, driving, shopping, community activity, and yard work  PERSONAL FACTORS: Age, Past/current experiences, Time since onset of injury/illness/exacerbation, and 3+  comorbidities: MG, HTN, CVA, lymphedema are also affecting patient's functional outcome.   REHAB POTENTIAL: Good  CLINICAL DECISION MAKING: Unstable/unpredictable  EVALUATION COMPLEXITY: High  PLAN:  PT FREQUENCY: 1-2x/week  PT DURATION: 8 weeks  PLANNED INTERVENTIONS: 97164- PT Re-evaluation, 97750- Physical Performance Testing, 97110-Therapeutic exercises, 97530- Therapeutic activity, [id number]- Neuromuscular re-education, 97535- Self Care, [id number]- Manual therapy, [id number]- Gait training, [phone]- Orthotic Initial, [phone]- Orthotic/Prosthetic subsequent, [phone]- Canalith repositioning, [id number]- Electrical stimulation (unattended), [phone]- Electrical stimulation (manual), [id number]- Wound care (first 20 sq cm), 97598- Wound care (each additional 20 sq cm), Patient/Family education, Balance training, Stair training, Taping, Dry Needling, Joint mobilization, Joint manipulation, Vestibular training, Visual/preceptual remediation/compensation, Cognitive remediation, DME instructions, Wheelchair mobility training, Cryotherapy, and Moist heat  PLAN FOR NEXT SESSION:     Add STS to HEP HIGT (high intensity gait training) using litegait harness either on treadmill vs overground Continue overground gait training in litegait harness at next visit - decreasing reliance on R UE support Continue stair navigation training Dynamic gait training using bari-RW vs litegait Backwards Side stepping Obstacle navigation (turning) Working on forward propulsion with increased L hip extension and forward progression of pelvis over L stance phase Dynamic standing balance of trunk rotation Continue monitoring SKC fit & see if posterior knee strap needs to be adjusted    Fonda Simpers, PT, DPT Physical Therapist - [facility]  [facility]  03/05/2024, 5:19 PM

## 2024-03-05 NOTE — Therapy (Signed)
 Outpatient Occupational Therapy Neuro Treatment Note  Patient Name: [name]. MRN: [id number] DOB:1950/08/17, 73 y.o., male Today's Date: 03/05/2024  PCP: Alm Rower, MD REFERRING PROVIDER: Hermann Toribio PARAS, PA-C  END OF SESSION:  OT End of Session - 03/05/24 2004     Visit Number 55    Number of Visits 72    Date for Recertification  03/24/24    OT Start Time 1405    OT Stop Time 1445    OT Time Calculation (min) 40 min    Activity Tolerance Patient tolerated treatment well    Behavior During Therapy [facility] for tasks assessed/performed         Past Medical History:  Diagnosis Date   Anemia    Cervical myelopathy (HCC)    Chronic venous insufficiency    [name] (chronic kidney disease), stage III (HCC)    Diabetes mellitus without complication (HCC)    GERD (gastroesophageal reflux disease)    Hypercholesteremia    Hypothyroidism    Kidney stones    about 50 in the past--last in March 4/24   Leg weakness, bilateral    due to Myasthenia Gravis   Myasthenia gravis (HCC)    Spinal stenosis    Vitamin B 12 deficiency    Vitamin B 12 deficiency    Wears hearing aid    bilateral   Past Surgical History:  Procedure Laterality Date   BACK SURGERY  09/2015   CARDIAC CATHETERIZATION     CATARACT EXTRACTION W/ INTRAOCULAR LENS IMPLANT     CATARACT EXTRACTION W/PHACO Right 12/21/2015   Procedure: CATARACT EXTRACTION PHACO AND INTRAOCULAR LENS PLACEMENT (IOC);  Surgeon: Dene Etienne, MD;  Location: [facility];  Service: Ophthalmology;  Laterality: Right;  DIABETIC - insulin  and oral meds   COLONOSCOPY     SHOULDER SURGERY     labrum repair   TONSILLECTOMY     Patient Active Problem List   Diagnosis Date Noted   Acquired left foot drop 01/10/2024   Left spastic hemiplegia (HCC) 01/10/2024   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) 10/11/2023   High blood pressure 08/13/2023   Anemia 08/13/2023    Constipation 08/13/2023   Myasthenia gravis (HCC) 08/13/2023   Acute ischemic right MCA stroke (HCC) 07/19/2023   Lymphedema 12/27/2016   Leg pain 07/15/2016   Chronic venous insufficiency 07/15/2016   Swelling of limb 07/15/2016   Type 2 diabetes mellitus (HCC) 07/15/2016   Hyperlipidemia 07/15/2016   ONSET DATE: 07/13/2023  REFERRING DIAG: Acute ischemic R MCA CVA  THERAPY DIAG: muscle weakness (generalized), other lack of coordination, vision disturbance, hemiplegia and hemiparesis following cerebral infarction affecting left non-dominant side (HCC)  Rationale for Evaluation and Treatment: Rehabilitation  SUBJECTIVE:  SUBJECTIVE STATEMENT: Pt reports getting a visit scheduled in January to determine surgical candidacy for Vivistim implant.   Pt accompanied by: [name]  PERTINENT HISTORY: Pt. Was admitted to [facility] on 07/13/2023 after sustaining a CVA while on a trip to the beach. Pt. Was diagnosed with R MCA CVA. Pt. Was admitted to inpatient rehab from 07/19/2023-08/13/2023. Past medical history includes high BP, anemia, and Myasthenia Gravis.   PRECAUTIONS: None  WEIGHT BEARING RESTRICTIONS: No  PAIN: 03/03/24: No pain  FALLS: Has patient fallen in last 6 months? Yes  LIVING ENVIRONMENT: Lives with: lives with their family and lives with their spouse-  Lives in: House/apartment- 2 story home and resides mostly on the 1st floor Stairs: No- Ramped entrance  Has following equipment at home:  Vannie, cane, Steadi, shower chair, handheld shower head, bedside commode  PLOF: Independent and Independent with basic ADLs Independent at home   PATIENT GOALS: Would like to walk 10-20 feet each time. 01/16/24: Update: pt has restarted PT visits and will be continuing to focus on mobility in those sessions.  Pt continues to work towards improving functional use of the LUE during OT visits.   OBJECTIVE:  Note: Objective measures were completed at Evaluation unless otherwise  noted.  HAND DOMINANCE: Right  ADLs: Overall ADLs:  Transfers/ambulation related to ADLs: Eating: Independent, 100% R handed Grooming: independent UB Dressing: Can put on shirt independently LB Dressing: Difficulty with pants and requires assistance getting the pants on his feet, has difficulty putting on shoes and socks Toileting: Tot A for toilet hygiene. Uses Steadi during toilet transfers. Bathing: Requires assist with using the L arm to wash the R arm. Tub Shower transfers: Roll in shower, built in shower chair, grab bars and hand held shower head.  IADLs: Shopping: Total A (Wife typically did the shopping prior to onset)   Light housekeeping: Total A (wife typically performs prior to onset) Meal Prep: Independent with light snack prep Community mobility: No driving, able to get in/out of the car Medication management: Set up assistance from his wife using a pill box (pt. Is responsible for taking medications at the correct time) Financial management: family members check behind him. Handwriting: TBD Hobbies: gardening, wood working and sports- [employer] Work history: Owns a tax business  MOBILITY STATUS: Needs Assist:    POSTURE COMMENTS:  Sitting balance: Good supported sitting balance  FUNCTIONAL OUTCOME MEASURES:  Fugl-Meyer: see below  UPPER EXTREMITY ROM:    Active ROM Right Eval  [facility] Right 09/23/2023 [facility] Left eval Left 09/23/2023 Left 10/29/23 Left 12/02/23 Left 12/31/23 Left 02/11/24  Shoulder flexion   89(110) 109(114) 111(115) 90 before scaption (110) 95 before scaption (110) 100 before scaption (110)  Shoulder abduction   98(120) 109(120) 111(122) 100 (110) 105(118) 107(121)  Shoulder adduction          Shoulder extension          Shoulder internal rotation          Shoulder external rotation          Elbow flexion   120(140) 130(136) 141(145)  146 146  Elbow extension   -28(-10) -5(0) -18(0)  -3(0) 0  Wrist flexion   60(60) Rest at 60 50(65) 54(60)      Wrist extension   -48(42) 10(40) 20(50) 30 (45) 30(45) 45(54)  Wrist ulnar deviation     12(22)     Wrist radial deviation     10(20)     Wrist pronation     (82)90   85  Wrist supination     64(74) 70 (75)  34(74)  (Blank rows = not tested)  Eval: L Digit flexion: 2nd:3cm (0cm) 3rd: 0cm (0cm), 4th: 0cm (0cm), 5th: 2cm (0cm)  09/23/23:  L Digit flexion to [facility]: 2nd: 3.5cm (0cm), 3rd: 4 cm(0cm), 4th 3.5cm (0cm), 5th: 2cm (0cm)  Eval:  L Digit extension: Active Gross digit extension through 10% of ROM  09/23/23:  L Digit extension: Active Gross digit extension through 50% of ROM  10/29/23:  L Digit extension: Active Gross digit extension through 25-50% of ROM  12/02/23:  L digit extension: Active gross ext through 30%   12/31/23:  L digit extension:  Active gross ext through ~30-40%  02/11/24:  Left full composite fist  L digit extension:  Active gross ext through 60% range  UPPER EXTREMITY MMT:     MMT Right eval Left eval Left 09/23/2023 Left 10/29/23 Left 12/02/23 Left 12/31/23 Left 02/11/24  Shoulder flexion 5 3- 3-/5 3-/5  3-/5 3-/5  Shoulder abduction 5 3- 3-/5 3-/5  3-5 3-/5  Shoulder adduction         Shoulder extension         Shoulder internal rotation         Shoulder external rotation         Middle trapezius         Lower trapezius         Elbow flexion 5 TBD 3+/5 4-/5 4+/5 4+/5 4+/5  Elbow extension 5 TBD 4-/5 3-/5 4+/5 4+/5 4+/5  Wrist flexion   2/5 3-/5 3-    Wrist extension  2- 2/5 2+/5 3- 3-/5 3-/5  Wrist ulnar deviation         Wrist radial deviation         Wrist pronation    3/5 3+  3+/5  Wrist supination    3-/5 3-  3-/5  (Blank rows = not tested)  HAND FUNCTION:  Eval:  Grip strength: Right: 94#, Left: 1#,  Lateral Key Pinch strength: Right: 14#, Left: 3#  10/29/23:  Grip strength: Left: 16# Lateral Key Pinch strength: Left: 5#  12/02/23: L: 5 lbs, R: 98 # Lateral pinch: Left: 5 lbs   12/31/23:   Grip strength: Right:  105#, Left: 14#,   02/11/24:   Grip strength: Left: 22#  COORDINATION: Box and Blocks: Right: 34 blocks completed in 1 min., Left: 0 blocks completed in 1 min.   10/29/23:  Box and Blocks: Right: 34 blocks completed in 1 min., Left: 2 blocks completed in 1 min.  9 Hole Peg Test: Left: To remove 9 pegs from a vertical position: 1 min. &13 sec.  12/02/23: Box and Blocks: 4 blocks 12/02/23: 4 pegs out in 3 min (multiple pulled and dropped out of the cup)   12/02/23: Box and Blocks Test: 5 blocks in 1 min.  02/10/24: Box and Blocks Test: 5 blocks in 1 min.  02/11/24: 1 min. & 18 sec. to remove 9 pegs.  Box and Blocks: 1 block in 1 min.   FUGL-MEYER ASSESSMENT   A. Upper Extremity   I. Reflex Activity:   12/12/23   Flexors (biceps) 2   Extensors (triceps) 2   Total 4/4                 II. Volitional Movement within Synergies:   12/12/23   Flexor Synergy:      Shoulder Elevation 2   Shoulder Retraction 2   Shoulder Abduction (at least 90*) 2   External Rotation 1   Elbow Flexion 2   Forearm Supination 1   Extensor Synergy:      Shoulder Adduction/Internal Rotation 2   Elbow Extension 1   Forearm Pronation 2   Total 15/18     III. Movement Combining Synergies:   12/12/23   Hand to Lumbar Spine 1   Shoulder Flexion at 90*, elbow at 0* 0   Pron/Sup w/elbow at 90* and shoulder at 0* 1   Total 2/6     IV. Movement Out of Synergy   12/12/23   Shoulder Abduction to 90*, elbow at 0*, forearm pronated 0   Shoulder Flexion 9)-180*, elbow at 0*, forearm in  mid position 0   Pron/sup, elbow at 0* and shoulder between 30-90* of flexion 0   Total 0/6       B. Wrist   12/12/23   Stability of wrist at 15* ext, elbow at 90*, shoulder 0* 2   Repeated flex/ext, elbow at 90*, shoulder 0* 1   Stability of wrist at 15* ext, elbow at 0*, shoulder 30* 0   Flex/ext, elbow 0*, shoulder 30* 0   Circumduction  1   Total 4/10     C. Hand   12/12/23   Mass Flexion 1   Mass Extension 1    Total 2/4       D. Grasp   12/12/23   Hook  0   Thumb Adduction - paper 1   Pincer - pen 0   Cylindrical - cup/can 0   Spherical - tennis ball 1   Total 2/10     E. Coordination/Speed   12/12/23   Tremor 2   Dysmetria 0   Time 1   Total 3/6     FUGL-MEYER ASSESSMENT: UPPER EXTREMITY A-E TOTAL on (date): 32/66   SENSATION: Sensation feels different in both hands.  L hand Light touch- impaired L hand Proprioception- impaired  EDEMA: Has had hx of edema, and came to Eval session with swelling in the L hand/wrist/forearm.   *pt. has a laceration from his dog on the dorsal aspect of the L hand.   MUSCLE TONE: Increased tone through the UE and hand flexors.   COGNITION: Overall cognitive status: Within functional limits for tasks assessed  VISION: Subjective report: L sided inattention Baseline vision: Wears glasses for reading only Visual history: Right visual occlusion  VISION ASSESSMENT: To be assessed  PERCEPTION: TBD  PRAXIS: Impaired                                                                                                                 TREATMENT DATE: 03/03/2024 Neuro re-ed: -Alternated WB through the wrist and hand in sitting and standing with added lateral WS L/R for increased proprioceptive feedback through the LUE/LLE, working to normalize spasticity for tasks noted below; dycem used beneath palm for stabilization.   -Facilitated increased engagement of the LLE and LUE during STS transfers with R hand placed over L hand positioned on L knee during STS transfer trials.  OT provided min vc for forward weight shift with slight lean over L knee; CGA-min A to achieve full standing position.    Therapeutic Activity: -STS trials from edge of mat table with focus on midline weight distribution into BUEs/BLEs, requiring OT assist to set up L hand flat on mat table, and stabilization at the L wrist and elbow during forward WS and push off mat.   -Facilitated  dynamic standing balance, L lateral weight shifting, LUE functional reaching, and L hand grasp/release task practice moving circular discs from table top to cup which was positioned to challenge forward and lateral reaching at chest level.  Constant CGA and intermittent min  guard to prevent L knee buckling, and intermittent min A to set up block in L hand.   Education: Education details:  Increasing WB through the LUE/LLE during STS trials Person educated: Patient Education method: Explanation, Demonstration, Tactile cues, and Verbal cues Education comprehension: verbalized understanding, returned demonstration, verbal cues required, and tactile cues required, further training needed  HOME EXERCISE PROGRAM: -grasping and stacking 1, and 1/2 cubes form the tabletop surface; yellow theraband for LUE strengthening   GOALS: Goals reviewed with patient? Yes  SHORT TERM GOALS: Target date: 02/11/2024   Pt. Will be independent with HEP for the LUE.  Baseline: 02/11/24: Independent 12/31/23: Independent; ongoing 12/03/23: indep with current HEP, though ongoing progressions are being made as UE function improves; 10/29/23: Pt. requires assist from wife with HEPs. Pt. Is consistently working on HEP's at home. 09/19/23: Requires assist from his wife Eval; no current HEP Goal status: Achieved,Ongoing  LONG TERM GOALS: Target date: 03/24/2024  Pt. Will increase L shoulder flexion by 10 degrees to be able to reach up to shelves. Baseline: 02/11/24: 100 before scaption (110) 12/31/23: Left: 95 degrees before scaption, 110 of scaption 12/03/23: 90* before onset of scaption; 10/29/23: 111(115) 09/19/23: Pt. Continues to be limited with reaching up to shelves. Eval: L shoulder flexion is 89 (110). Goal status: progressing, Ongoing  2.  Pt. Will increase L shoulder abduction by 10 degrees to assist with underarm hygiene.  Baseline: 02/11/24: 892(878) 12/31/23: Left: 894(881) 12/03/23: 100*; pt able to demo bilat  underarm hygiene with set up; 10/29/23: 888(877) 09/19/23: Pt. Continues to require assist with underarm hygiene.Eval: L shoulder abduction is 98 (120).  Goal status: Progressing, ongoing  3.  Pt. Will improve L wrist extension by 10 degrees to be able to initiate anticipation of grasping for objects.  Baseline: 02/11/24: 45(54) 12/31/23: 30(45) 12/03/23: 30* (45); 10/29/23: 20(55) 09/19/23: Consistent activation noted in the left wrist extensors with facilitation. Eval: -48 (42) Goal status: Ongoing  4.  Pt. Will improve active gross digit extension through 50% of the range to be able to release objects in his hand consistently.  Baseline:02/11/24: L digit extension: Active gross ext through 60% range 12/31/23: Gross digit extension: ~30-40% 12/03/23: Gross digit ext through ~30% of range (limited by flexor tone in the PIPs); 10/28/20: Gross digit extension through 25-50% of the range. 09/19/23: gross digit extension noted through 25% range with facilitation.Eval: gross digit extension is 10% of the range  Goal status: Ongoing  5.  Pt. Will independently demonstrate visual compensatory strategies when navigating through environment and during tabletop tasks. Baseline: 12/03/23: Pt demonstrates consistent head turns to scan to L visual field without cueing; 10/29/2023: Pt. Continues to less cuing for left sided awareness. 09/19/23: Pt. requires fewer cues for left sided awareness Eval: Pt. Requires consistent cuing for L sided awareness.  Goal status: achieved   6. Pt. Will improve left hand [facility] skills to be able to independently manipulate small objects during ADLs, and IADLs. Baseline: 02/11/24: 9 Hole Peg Test:1 min. & 18 sec. to remove 9 pegs.12/31/23: Pt. Is progressing to be able to grasp progressively smaller objects. 12/03/23: Pt removed 3 pegs in >3 min, pulling out more than 3 but repeatedly dropping to floor or table top rather than assessment dish; 10/29/23: 9 Hole Peg Test: Pt. Was able to remove 9 pegs  from vertical position on the pegboard in 1 min. & 13 sec. Pt. Is not yet able to pick up pegs from a horizontal position to place them  in the pegboard.   Goal status: ongoing   7. Pt will improve left hand function skills as evidenced by improved score to 4 blocks on the Box and Blocks test. Baseline: 02/11/24 Box and Blocks: 1 block in 1 min. 12/31/23: 4 blocks in 1 min. 12/03/23: 4 blocks after 3 trials and passive wrist and digit stretching between trials (not yet consistent to achieve 4 blocks); 10/29/23: Box and Blocks Test: 2 Blocks completed in 1 min.   Goal Status: ongoing   ASSESSMENT:  CLINICAL IMPRESSION:  Pt tolerated increased activity challenges today alternating between LUE task practice while seated edge of mat table with back unsupported and activity in standing which challenged functional reaching and dynamic standing balance.  Pt able to perform reaching slightly outside BOS in standing with lateral weight shifts to the L with min A, or CGA when pt used transport chair bar for RUE support.  Pt required frequent reps of WB through the hand to normalize tone after every few reps of grasp/release task practice.  With high reps of STS trials, pt was able to progress from predominantly shifting weight through the R side during STS to more even weight distribution through the BUEs on mat table and BLEs on floor, with OT assist for set up of L palm and digits flat on mat table, and vc for forward weight shift.  Encouraged pt begin focusing on even weight distribution as noted above during STS transfers.  Pt verbalized understanding/agreed to recommendations.  Pt. continues to benefit from OT services to work on improving LUE functioning to increase engagement of the LUE during daily tasks, and to provide education about compensatory strategies during ADLs/IADLs.   PERFORMANCE DEFICITS: in functional skills including ADLs, IADLs, coordination, dexterity, proprioception, sensation, edema, tone,  ROM, strength, pain, Fine motor control, Gross motor control, mobility, balance, endurance, vision, and UE functional use, cognitive skills including perception, and psychosocial skills including coping strategies, environmental adaptation, and routines and behaviors.   IMPAIRMENTS: are limiting patient from ADLs, IADLs, and leisure.   CO-MORBIDITIES: may have co-morbidities  that affects occupational performance. Patient will benefit from skilled OT to address above impairments and improve overall function.  MODIFICATION OR ASSISTANCE TO COMPLETE EVALUATION: Min-Moderate modification of tasks or assist with assess necessary to complete an evaluation.  OT OCCUPATIONAL PROFILE AND HISTORY: Detailed assessment: Review of records and additional review of physical, cognitive, psychosocial history related to current functional performance.  CLINICAL DECISION MAKING: Moderate - several treatment options, min-mod task modification necessary  REHAB POTENTIAL: Good  EVALUATION COMPLEXITY: Moderate  PLAN:  OT FREQUENCY: 2x/week  OT DURATION: 12 weeks  PLANNED INTERVENTIONS: 97168 OT Re-evaluation, 97535 self care/ADL training, 02889 therapeutic exercise, 97530 therapeutic activity, 97112 neuromuscular re-education, 97140 manual therapy, 97018 paraffin, 02989 moist heat, 97010 cryotherapy, 97034 contrast bath, 97760 Orthotic Initial, 97763 Orthotic/Prosthetic subsequent, passive range of motion, visual/perceptual remediation/compensation, energy conservation, and DME and/or AE instructions  RECOMMENDED OTHER SERVICES: PT and ST  CONSULTED AND AGREED WITH PLAN OF CARE: Patient and family member/caregiver  PLAN FOR NEXT SESSION: see above  Inocente Blazing, MS, OTR/L   03/05/2024, 8:08 PM

## 2024-03-06 ENCOUNTER — Telehealth: Payer: Self-pay | Admitting: Registered Nurse

## 2024-03-06 NOTE — Telephone Encounter (Signed)
 Okay ill give her a call on Monday to resend the forms, Thank you.

## 2024-03-06 NOTE — Telephone Encounter (Signed)
 Montie- numotion left vm yesterday and was asking about a form she sent regarding this pt on 11-27. She said it was a form from [employer] because they needed to change the coding on the order and needs it faxed back. Do you have this    [phone]

## 2024-03-08 NOTE — Therapy (Signed)
 Outpatient Occupational Therapy Neuro Treatment Note  Patient Name: [name]. MRN: [id number] DOB:05-02-1950, 73 y.o., male Today's Date: 03/08/2024  PCP: Alm Rower, MD REFERRING PROVIDER: Hermann Toribio PARAS, PA-C  END OF SESSION:  OT End of Session - 03/08/24 2038     Visit Number 56    Number of Visits 72    Date for Recertification  03/24/24    OT Start Time 1530    OT Stop Time 1615    OT Time Calculation (min) 45 min    Activity Tolerance Patient tolerated treatment well    Behavior During Therapy [facility] for tasks assessed/performed         Past Medical History:  Diagnosis Date   Anemia    Cervical myelopathy (HCC)    Chronic venous insufficiency    [name] (chronic kidney disease), stage III (HCC)    Diabetes mellitus without complication (HCC)    GERD (gastroesophageal reflux disease)    Hypercholesteremia    Hypothyroidism    Kidney stones    about 50 in the past--last in March 4/24   Leg weakness, bilateral    due to Myasthenia Gravis   Myasthenia gravis (HCC)    Spinal stenosis    Vitamin B 12 deficiency    Vitamin B 12 deficiency    Wears hearing aid    bilateral   Past Surgical History:  Procedure Laterality Date   BACK SURGERY  09/2015   CARDIAC CATHETERIZATION     CATARACT EXTRACTION W/ INTRAOCULAR LENS IMPLANT     CATARACT EXTRACTION W/PHACO Right 12/21/2015   Procedure: CATARACT EXTRACTION PHACO AND INTRAOCULAR LENS PLACEMENT (IOC);  Surgeon: Dene Etienne, MD;  Location: [facility];  Service: Ophthalmology;  Laterality: Right;  DIABETIC - insulin  and oral meds   COLONOSCOPY     SHOULDER SURGERY     labrum repair   TONSILLECTOMY     Patient Active Problem List   Diagnosis Date Noted   Acquired left foot drop 01/10/2024   Left spastic hemiplegia (HCC) 01/10/2024   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) 10/11/2023   High blood pressure 08/13/2023   Anemia 08/13/2023    Constipation 08/13/2023   Myasthenia gravis (HCC) 08/13/2023   Acute ischemic right MCA stroke (HCC) 07/19/2023   Lymphedema 12/27/2016   Leg pain 07/15/2016   Chronic venous insufficiency 07/15/2016   Swelling of limb 07/15/2016   Type 2 diabetes mellitus (HCC) 07/15/2016   Hyperlipidemia 07/15/2016   ONSET DATE: 07/13/2023  REFERRING DIAG: Acute ischemic R MCA CVA  THERAPY DIAG: muscle weakness (generalized), other lack of coordination, vision disturbance, hemiplegia and hemiparesis following cerebral infarction affecting left non-dominant side (HCC)  Rationale for Evaluation and Treatment: Rehabilitation  SUBJECTIVE:  SUBJECTIVE STATEMENT: Pt reports that he's been focusing on distributing his weight more evenly through his BUEs and BLEs during STS transfers since last session.     Pt accompanied by: [name]  PERTINENT HISTORY: Pt. Was admitted to [facility] on 07/13/2023 after sustaining a CVA while on a trip to the beach. Pt. Was diagnosed with R MCA CVA. Pt. Was admitted to inpatient rehab from 07/19/2023-08/13/2023. Past medical history includes high BP, anemia, and Myasthenia Gravis.   PRECAUTIONS: None  WEIGHT BEARING RESTRICTIONS: No  PAIN: 03/05/24: No pain  FALLS: Has patient fallen in last 6 months? Yes  LIVING ENVIRONMENT: Lives with: lives with their family and lives with their spouse-  Lives in: House/apartment- 2 story home and  resides mostly on the 1st floor Stairs: No- Ramped entrance Has following equipment at home:  Vannie, cane, Steadi, shower chair, handheld shower head, bedside commode  PLOF: Independent and Independent with basic ADLs Independent at home   PATIENT GOALS: Would like to walk 10-20 feet each time. 01/16/24: Update: pt has restarted PT visits and will be continuing to focus on mobility in those sessions.  Pt continues to work towards improving functional use of the LUE during OT visits.   OBJECTIVE:  Note: Objective measures were  completed at Evaluation unless otherwise noted.  HAND DOMINANCE: Right  ADLs: Overall ADLs:  Transfers/ambulation related to ADLs: Eating: Independent, 100% R handed Grooming: independent UB Dressing: Can put on shirt independently LB Dressing: Difficulty with pants and requires assistance getting the pants on his feet, has difficulty putting on shoes and socks Toileting: Tot A for toilet hygiene. Uses Steadi during toilet transfers. Bathing: Requires assist with using the L arm to wash the R arm. Tub Shower transfers: Roll in shower, built in shower chair, grab bars and hand held shower head.  IADLs: Shopping: Total A (Wife typically did the shopping prior to onset)   Light housekeeping: Total A (wife typically performs prior to onset) Meal Prep: Independent with light snack prep Community mobility: No driving, able to get in/out of the car Medication management: Set up assistance from his wife using a pill box (pt. Is responsible for taking medications at the correct time) Financial management: family members check behind him. Handwriting: TBD Hobbies: gardening, wood working and sports- [employer] Work history: Owns a tax business  MOBILITY STATUS: Needs Assist:    POSTURE COMMENTS:  Sitting balance: Good supported sitting balance  FUNCTIONAL OUTCOME MEASURES:  Fugl-Meyer: see below  UPPER EXTREMITY ROM:    Active ROM Right Eval  [facility] Right 09/23/2023 [facility] Left eval Left 09/23/2023 Left 10/29/23 Left 12/02/23 Left 12/31/23 Left 02/11/24  Shoulder flexion   89(110) 109(114) 111(115) 90 before scaption (110) 95 before scaption (110) 100 before scaption (110)  Shoulder abduction   98(120) 109(120) 111(122) 100 (110) 105(118) 107(121)  Shoulder adduction          Shoulder extension          Shoulder internal rotation          Shoulder external rotation          Elbow flexion   120(140) 130(136) 141(145)  146 146  Elbow extension   -28(-10) -5(0) -18(0)  -3(0) 0  Wrist  flexion   60(60) Rest at 60 50(65) 54(60)     Wrist extension   -48(42) 10(40) 20(50) 30 (45) 30(45) 45(54)  Wrist ulnar deviation     12(22)     Wrist radial deviation     10(20)     Wrist pronation     (82)90   85  Wrist supination     64(74) 70 (75)  34(74)  (Blank rows = not tested)  Eval: L Digit flexion: 2nd:3cm (0cm) 3rd: 0cm (0cm), 4th: 0cm (0cm), 5th: 2cm (0cm)  09/23/23:  L Digit flexion to [facility]: 2nd: 3.5cm (0cm), 3rd: 4 cm(0cm), 4th 3.5cm (0cm), 5th: 2cm (0cm)  Eval:  L Digit extension: Active Gross digit extension through 10% of ROM  09/23/23:  L Digit extension: Active Gross digit extension through 50% of ROM  10/29/23:  L Digit extension: Active Gross digit extension through 25-50% of ROM  12/02/23:  L digit extension: Active gross ext through 30%   12/31/23:  L  digit extension:  Active gross ext through ~30-40%   02/11/24:  Left full composite fist  L digit extension:  Active gross ext through 60% range  UPPER EXTREMITY MMT:     MMT Right eval Left eval Left 09/23/2023 Left 10/29/23 Left 12/02/23 Left 12/31/23 Left 02/11/24  Shoulder flexion 5 3- 3-/5 3-/5  3-/5 3-/5  Shoulder abduction 5 3- 3-/5 3-/5  3-5 3-/5  Shoulder adduction         Shoulder extension         Shoulder internal rotation         Shoulder external rotation         Middle trapezius         Lower trapezius         Elbow flexion 5 TBD 3+/5 4-/5 4+/5 4+/5 4+/5  Elbow extension 5 TBD 4-/5 3-/5 4+/5 4+/5 4+/5  Wrist flexion   2/5 3-/5 3-    Wrist extension  2- 2/5 2+/5 3- 3-/5 3-/5  Wrist ulnar deviation         Wrist radial deviation         Wrist pronation    3/5 3+  3+/5  Wrist supination    3-/5 3-  3-/5  (Blank rows = not tested)  HAND FUNCTION:  Eval:  Grip strength: Right: 94#, Left: 1#,  Lateral Key Pinch strength: Right: 14#, Left: 3#  10/29/23:  Grip strength: Left: 16# Lateral Key Pinch strength: Left: 5#  12/02/23: L: 5 lbs, R: 98 # Lateral pinch: Left:  5 lbs   12/31/23:   Grip strength: Right: 105#, Left: 14#,   02/11/24:   Grip strength: Left: 22#  COORDINATION: Box and Blocks: Right: 34 blocks completed in 1 min., Left: 0 blocks completed in 1 min.   10/29/23:  Box and Blocks: Right: 34 blocks completed in 1 min., Left: 2 blocks completed in 1 min.  9 Hole Peg Test: Left: To remove 9 pegs from a vertical position: 1 min. &13 sec.  12/02/23: Box and Blocks: 4 blocks 12/02/23: 4 pegs out in 3 min (multiple pulled and dropped out of the cup)   12/02/23: Box and Blocks Test: 5 blocks in 1 min.  02/10/24: Box and Blocks Test: 5 blocks in 1 min.  02/11/24: 1 min. & 18 sec. to remove 9 pegs.  Box and Blocks: 1 block in 1 min.   FUGL-MEYER ASSESSMENT   A. Upper Extremity   I. Reflex Activity:   12/12/23   Flexors (biceps) 2   Extensors (triceps) 2   Total 4/4                 II. Volitional Movement within Synergies:   12/12/23   Flexor Synergy:      Shoulder Elevation 2   Shoulder Retraction 2   Shoulder Abduction (at least 90*) 2   External Rotation 1   Elbow Flexion 2   Forearm Supination 1   Extensor Synergy:      Shoulder Adduction/Internal Rotation 2   Elbow Extension 1   Forearm Pronation 2   Total 15/18     III. Movement Combining Synergies:   12/12/23   Hand to Lumbar Spine 1   Shoulder Flexion at 90*, elbow at 0* 0   Pron/Sup w/elbow at 90* and shoulder at 0* 1   Total 2/6     IV. Movement Out of Synergy   12/12/23   Shoulder Abduction to 90*, elbow at 0*, forearm pronated 0  Shoulder Flexion 9)-180*, elbow at 0*, forearm in mid position 0   Pron/sup, elbow at 0* and shoulder between 30-90* of flexion 0   Total 0/6     B. Wrist   12/12/23   Stability of wrist at 15* ext, elbow at 90*, shoulder 0* 2   Repeated flex/ext, elbow at 90*, shoulder 0* 1   Stability of wrist at 15* ext, elbow at 0*, shoulder 30* 0   Flex/ext, elbow 0*, shoulder 30* 0   Circumduction  1   Total 4/10     C. Hand   12/12/23    Mass Flexion 1   Mass Extension 1   Total 2/4     D. Grasp   12/12/23   Hook  0   Thumb Adduction - paper 1   Pincer - pen 0   Cylindrical - cup/can 0   Spherical - tennis ball 1   Total 2/10     E. Coordination/Speed   12/12/23   Tremor 2   Dysmetria 0   Time 1   Total 3/6     FUGL-MEYER ASSESSMENT: UPPER EXTREMITY A-E TOTAL on (date): 32/66   SENSATION: Sensation feels different in both hands.  L hand Light touch- impaired L hand Proprioception- impaired  EDEMA: Has had hx of edema, and came to Eval session with swelling in the L hand/wrist/forearm.   *pt. has a laceration from his dog on the dorsal aspect of the L hand.   MUSCLE TONE: Increased tone through the UE and hand flexors.   COGNITION: Overall cognitive status: Within functional limits for tasks assessed  VISION: Subjective report: L sided inattention Baseline vision: Wears glasses for reading only Visual history: Right visual occlusion  VISION ASSESSMENT: To be assessed  PERCEPTION: TBD  PRAXIS: Impaired                                                                                                                 TREATMENT DATE: 03/05/2024 Neuro re-ed: -Alternated WB through the wrist and hand in sitting and standing with added lateral WS L/R for increased proprioceptive feedback through the LUE/LLE, working to normalize spasticity for tasks noted below; dycem used beneath palm for stabilization.   -Facilitated increased engagement of the LLE and LUE during STS transfers with R hand placed over L hand positioned on L knee during STS transfer trials.  OT provided min vc for forward weight shift with slight lean over L knee; CGA-min A to achieve full standing position.    Therapeutic Activity: -Facilitated dynamic standing balance, L lateral weight shifting, LUE functional reaching, and L hand grasp/release task practice moving 1 blocks from velcro board toward target (OT' s hand).  Target  shifted to challenge forward and lateral reaching patterns slightly outside BOS L of midline at chest level and below.  Constant CGA and intermittent min guard to prevent L knee buckling, and intermittent min A to set up block in L hand.   Education: Education details:  Increasing WB through the LUE/LLE  during STS trials Person educated: Patient Education method: Explanation, Demonstration, Tactile cues, and Verbal cues Education comprehension: verbalized understanding, returned demonstration, verbal cues required, and tactile cues required, further training needed  HOME EXERCISE PROGRAM: -grasping and stacking 1, and 1/2 cubes form the tabletop surface; yellow theraband for LUE strengthening   GOALS: Goals reviewed with patient? Yes  SHORT TERM GOALS: Target date: 02/11/2024   Pt. Will be independent with HEP for the LUE.  Baseline: 02/11/24: Independent 12/31/23: Independent; ongoing 12/03/23: indep with current HEP, though ongoing progressions are being made as UE function improves; 10/29/23: Pt. requires assist from wife with HEPs. Pt. Is consistently working on HEP's at home. 09/19/23: Requires assist from his wife Eval; no current HEP Goal status: Achieved,Ongoing  LONG TERM GOALS: Target date: 03/24/2024  Pt. Will increase L shoulder flexion by 10 degrees to be able to reach up to shelves. Baseline: 02/11/24: 100 before scaption (110) 12/31/23: Left: 95 degrees before scaption, 110 of scaption 12/03/23: 90* before onset of scaption; 10/29/23: 111(115) 09/19/23: Pt. Continues to be limited with reaching up to shelves. Eval: L shoulder flexion is 89 (110). Goal status: progressing, Ongoing  2.  Pt. Will increase L shoulder abduction by 10 degrees to assist with underarm hygiene.  Baseline: 02/11/24: 892(878) 12/31/23: Left: 894(881) 12/03/23: 100*; pt able to demo bilat underarm hygiene with set up; 10/29/23: 888(877) 09/19/23: Pt. Continues to require assist with underarm hygiene.Eval: L  shoulder abduction is 98 (120).  Goal status: Progressing, ongoing  3.  Pt. Will improve L wrist extension by 10 degrees to be able to initiate anticipation of grasping for objects.  Baseline: 02/11/24: 45(54) 12/31/23: 30(45) 12/03/23: 30* (45); 10/29/23: 20(55) 09/19/23: Consistent activation noted in the left wrist extensors with facilitation. Eval: -48 (42) Goal status: Ongoing  4.  Pt. Will improve active gross digit extension through 50% of the range to be able to release objects in his hand consistently.  Baseline:02/11/24: L digit extension: Active gross ext through 60% range 12/31/23: Gross digit extension: ~30-40% 12/03/23: Gross digit ext through ~30% of range (limited by flexor tone in the PIPs); 10/28/20: Gross digit extension through 25-50% of the range. 09/19/23: gross digit extension noted through 25% range with facilitation.Eval: gross digit extension is 10% of the range  Goal status: Ongoing  5.  Pt. Will independently demonstrate visual compensatory strategies when navigating through environment and during tabletop tasks. Baseline: 12/03/23: Pt demonstrates consistent head turns to scan to L visual field without cueing; 10/29/2023: Pt. Continues to less cuing for left sided awareness. 09/19/23: Pt. requires fewer cues for left sided awareness Eval: Pt. Requires consistent cuing for L sided awareness.  Goal status: achieved   6. Pt. Will improve left hand [facility] skills to be able to independently manipulate small objects during ADLs, and IADLs. Baseline: 02/11/24: 9 Hole Peg Test:1 min. & 18 sec. to remove 9 pegs.12/31/23: Pt. Is progressing to be able to grasp progressively smaller objects. 12/03/23: Pt removed 3 pegs in >3 min, pulling out more than 3 but repeatedly dropping to floor or table top rather than assessment dish; 10/29/23: 9 Hole Peg Test: Pt. Was able to remove 9 pegs from vertical position on the pegboard in 1 min. & 13 sec. Pt. Is not yet able to pick up pegs from a horizontal  position to place them in the pegboard.   Goal status: ongoing   7. Pt will improve left hand function skills as evidenced by improved score to 4 blocks on the  Box and Blocks test. Baseline: 02/11/24 Box and Blocks: 1 block in 1 min. 12/31/23: 4 blocks in 1 min. 12/03/23: 4 blocks after 3 trials and passive wrist and digit stretching between trials (not yet consistent to achieve 4 blocks); 10/29/23: Box and Blocks Test: 2 Blocks completed in 1 min.   Goal Status: ongoing   ASSESSMENT:  CLINICAL IMPRESSION:  Pt reports focusing on distributing his weight more evenly through his BUEs and BLEs during STS transfers at home after this was practiced last OT session.  Very evident carryover observed as pt required less physical assist and fewer vc to initiate L lateral WS during STS, static standing, and dynamic standing tasks.  Pt continues to tolerate increased activity challenges alternating between LUE task practice in sitting with back unsupported (sitting edge of mat) and standing.  Given targets for functional reaching L of midline between chest level and table top surface, pt required less blocking of L knee today during standing activities, mostly CGA with occasional min A to block L knee to prevent buckling.  Pt required frequent reps of WB through the hand to normalize tone after every few reps of grasp/release task practice.  Pt still demonstrates frequent dropping of items from L hand as a result of sensory loss, L hand weakness, and decreased attention to L hand during dual task challenges.  Pt verbalized understanding/agreed to recommendations.  Pt. continues to benefit from OT services to work on improving LUE functioning to increase engagement of the LUE during daily tasks, and to provide education about compensatory strategies during ADLs/IADLs.   PERFORMANCE DEFICITS: in functional skills including ADLs, IADLs, coordination, dexterity, proprioception, sensation, edema, tone, ROM, strength, pain,  Fine motor control, Gross motor control, mobility, balance, endurance, vision, and UE functional use, cognitive skills including perception, and psychosocial skills including coping strategies, environmental adaptation, and routines and behaviors.   IMPAIRMENTS: are limiting patient from ADLs, IADLs, and leisure.   CO-MORBIDITIES: may have co-morbidities  that affects occupational performance. Patient will benefit from skilled OT to address above impairments and improve overall function.  MODIFICATION OR ASSISTANCE TO COMPLETE EVALUATION: Min-Moderate modification of tasks or assist with assess necessary to complete an evaluation.  OT OCCUPATIONAL PROFILE AND HISTORY: Detailed assessment: Review of records and additional review of physical, cognitive, psychosocial history related to current functional performance.  CLINICAL DECISION MAKING: Moderate - several treatment options, min-mod task modification necessary  REHAB POTENTIAL: Good  EVALUATION COMPLEXITY: Moderate  PLAN:  OT FREQUENCY: 2x/week  OT DURATION: 12 weeks  PLANNED INTERVENTIONS: 97168 OT Re-evaluation, 97535 self care/ADL training, 02889 therapeutic exercise, 97530 therapeutic activity, 97112 neuromuscular re-education, 97140 manual therapy, 97018 paraffin, 02989 moist heat, 97010 cryotherapy, 97034 contrast bath, 97760 Orthotic Initial, 97763 Orthotic/Prosthetic subsequent, passive range of motion, visual/perceptual remediation/compensation, energy conservation, and DME and/or AE instructions  RECOMMENDED OTHER SERVICES: PT and ST  CONSULTED AND AGREED WITH PLAN OF CARE: Patient and family member/caregiver  PLAN FOR NEXT SESSION: see above  Inocente Blazing, MS, OTR/L  03/08/2024, 8:40 PM

## 2024-03-09 NOTE — Therapy (Signed)
 OUTPATIENT PHYSICAL THERAPY TREATMENT   Patient Name: [name]. MRN: [id number] DOB:1951-02-27, 73 y.o., male Today's Date: 03/11/2024 PCP: Dennise Remak, MD  REFERRING PROVIDER:  Pegge Toribio PARAS, PA-C  END OF SESSION:   PT End of Session - 03/10/24 1352     Visit Number 56    Number of Visits 64    Date for Recertification  03/24/24    Authorization Type [employer]; 3 PT visits approved from 12/16.    Authorization Time Period [facility] auth#; [id number]    Authorization - Number of Visits 16    Progress Note Due on Visit 60    PT Start Time 1446    PT Stop Time 1529    PT Time Calculation (min) 43 min    Equipment Utilized During Treatment Gait belt    Activity Tolerance Patient tolerated treatment well;No increased pain    Behavior During Therapy WFL for tasks assessed/performed             Past Medical History:  Diagnosis Date   Anemia    Cervical myelopathy (HCC)    Chronic venous insufficiency    [name] (chronic kidney disease), stage III (HCC)    Diabetes mellitus without complication (HCC)    GERD (gastroesophageal reflux disease)    Hypercholesteremia    Hypothyroidism    Kidney stones    about 50 in the past--last in March 4/24   Leg weakness, bilateral    due to Myasthenia Gravis   Myasthenia gravis (HCC)    Spinal stenosis    Vitamin B 12 deficiency    Vitamin B 12 deficiency    Wears hearing aid    bilateral   Past Surgical History:  Procedure Laterality Date   BACK SURGERY  09/2015   CARDIAC CATHETERIZATION     CATARACT EXTRACTION W/ INTRAOCULAR LENS IMPLANT     CATARACT EXTRACTION W/PHACO Right 12/21/2015   Procedure: CATARACT EXTRACTION PHACO AND INTRAOCULAR LENS PLACEMENT (IOC);  Surgeon: Dene Etienne, MD;  Location: [facility];  Service: Ophthalmology;  Laterality: Right;  DIABETIC - insulin  and oral meds   COLONOSCOPY     SHOULDER SURGERY     labrum repair   TONSILLECTOMY     Patient Active Problem List   Diagnosis  Date Noted   Acquired left foot drop 01/10/2024   Left spastic hemiplegia (HCC) 01/10/2024   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) 10/11/2023   High blood pressure 08/13/2023   Anemia 08/13/2023   Constipation 08/13/2023   Myasthenia gravis (HCC) 08/13/2023   Acute ischemic right MCA stroke (HCC) 07/19/2023   Lymphedema 12/27/2016   Leg pain 07/15/2016   Chronic venous insufficiency 07/15/2016   Swelling of limb 07/15/2016   Type 2 diabetes mellitus (HCC) 07/15/2016   Hyperlipidemia 07/15/2016    ONSET DATE: 07/19/23  REFERRING DIAG:  P36.488 (ICD-10-CM) - Cerebral infarction due to unspecified occlusion or stenosis of right middle cerebral artery   THERAPY DIAG:  Muscle weakness (generalized)  Other lack of coordination  Hemiplegia and hemiparesis following cerebral infarction affecting left non-dominant side (HCC)  Difficulty in walking, not elsewhere classified  Rationale for Evaluation and Treatment: Rehabilitation  SUBJECTIVE:  SUBJECTIVE STATEMENT:   Patient reports he has not walked in the past two days due to the cold.     From session on 01/14/2024: Patient states the MD suggested/recommended patient get an AFO for his L LE and patient has an appointment with [facility] the 2nd week of November for an assessment.     PERTINENT HISTORY:  CVA with Left hemiplegia in April 2025. Pt started on a swedish knee cage at CIR due to uncontrolled hyperextension in stance. Pt had not been moving much since discharging from CIR, as therapists instructed pt to perform transfers only at home due to high fall risk with ambulation. Continues to have significant weakenss in the LUE with difficulty extending fingers as well as numbness and inattention  to the L side of body resulting in multiple cuts on Arm and leg with WC mobility in home. Will be attaining custom power WC from [employer]. Has loaner currently.   PAIN:  Are you having pain? No  PRECAUTIONS: Fall, history of wounds on LLE   *Latex allergy   WEIGHT BEARING RESTRICTIONS: No  FALLS: Has patient fallen in last 6 months? Yes. Number of falls 1  LIVING ENVIRONMENT: Lives with: lives with their spouse Lives in: House/apartment Stairs: Ramps installed while in the Hospital  Has following equipment at home: Vannie - 2 wheeled and Wheelchair (power)  PLOF: Independent with basic ADLs, Independent with household mobility without device, and Independent with community mobility with device with [facility]   PATIENT GOALS: relearn to Walk 15-4ft.   OBJECTIVE:                                                                                                                              TREATMENT DATE: 03/11/2024   Pt arrives in transport chair.   TA- To improve functional movements patterns for everyday tasks   Donned lite gait harness - LiteGait harness applied with patient in standing position, RW used for stability. Straps secured snugly around torso and legs per manufacturer guidelines. Checked for proper alignment, comfort, and safety prior to standing and gait training. All buckles and fasteners verified secure; weight-bearing support adjusted to patient tolerance  Pt then assisted with turning around in order to utilize the walker with the hand support/strap to keep the L UE in place.  Litegait utilized just for [employer] - activities focussed on specific components of gait cycle and multimodal cueing for completion  Gait training overground; the following trials completed in litegait harness for safety, but not providing BWS (body weight support), with therapist assist for driving litegait.  Overground ambulation within the main therapy hallway and OT hallway 86  ft with BRW with hand support  Pt required less rest breaks compared to the last visit. Difficulty with turning, requiring multipoint turning.   Forward/backwards ambulation with BrW and LiteGait. 2x50 ft each direction for 200 ft total.    TA- To improve functional movements  patterns for everyday tasks   Doffed lite gait harness- LiteGait harness removed with patient in standing position and transport chair behind. Due to increased abdominal pressure from harness, pt observed for dizziness post removal before transitioning to sitting for recovery.   Exited session in transport chair in care of OT    PATIENT EDUCATION: Education details: knee cage needs assessmnet as it is causeing crossover gait.  Person educated: Patient and Spouse Education method: Explanation, VC/TC/Demo Education comprehension: verbalized understanding  HOME EXERCISE PROGRAM: Access Code: [email or web address] URL: [email or web address] Date: 01/14/2024 Prepared by: Connell Kiss  Exercises - Seated Knee Extension AROM  - 1 x daily - 4 x weekly - 3 sets - 10 reps - Seated Hip Abduction  - 1 x daily - 4 x weekly - 3 sets - 10 reps - Seated March  - 1 x daily - 4 x weekly - 3 sets - 10 reps - Sit to Stand with Counter Support  - 1 x daily - 4 x weekly - 3 sets - 10 reps - Supine Short Arc Quad  - 1 x daily - 4 x weekly - 3 sets - 10 reps - Supine Hip Abduction  - 1 x daily - 4 x weekly - 3 sets - 10 reps - Supine Bridge  - 1 x daily - 4 x weekly - 3 sets - 3 reps - 2sec  hold - Seated Hamstring Stretch with Strap  - 2-3 x daily - 7 x weekly - 2 sets - 30 second to 1 minute hold - Seated Knee Flexion Slide  - 2-3 x daily - 7 x weekly - 2 sets - 30 seconds to 1 minute hold  12/17/2023: verbal instruction to complete repeated sit<>stands with education on safe set-up provided  GOALS: Goals reviewed with patient? Yes  SHORT TERM GOALS: Target date: 02/11/2024  Patient will be independent in home exercise  program to improve strength/mobility for better functional independence with ADLs. Baseline: Initiated on 09/11/2023 10/22/2023: will update when appropriate 11/26/2023: patient participating in transfer and gait practice in controlled environment with 2 person assistance at home in addition to HEP 12/31/2023: patient participating in gait using bari-RW at home with family assistance Goal status: IN PROGRESS  LONG TERM GOALS: Target date: 03/24/2024  Patient will increase SIS-16 score to equal to or greater than 10points to demonstrate statistically significant improvement in mobility and quality of life.  Baseline: 49/80 09/19/2023: 45/80 10/22/2023: 46/80 11/26/2023: 47/80 12/31/2023: 53/80 Goal status: IN PROGRESS  2.  Patient (> 70 years old) will complete five times sit to stand test in < 15 seconds indicating an increased LE strength and improved balance. Baseline: 1:61min 09/19/2023: 56.39 seconds using R UE support to push-up from armrest of green chair and requiring skilled min A for balance and lifting/lowering  10/22/2023: 27.10 seconds using R UE support to push-up from armrest  8/12: 27.7 sec with RUE pushing from arm rest.  11/26/2023: 37.42 seconds using R UE support to push-up from armrest of green chair and not using L UE support with min-modA for facilitating anterior weight shift and lifting to come to stand secondary to fatigue (would benefit from re-testing at next session) 12/17/2023: 24.59 seconds using R UE support to push-up from armrest of green chair and not using L UE support with CGA/light min A for safety/steadying  Goal status: IN PROGRESS  3.  Patient will increase FIST score by > 6 points to demonstrate decreased fall risk during functional activities Baseline:  43/56  09/23/2023: 53/56 Goal status: MET and UPGRADED  09/23/2023: Patient will increase Berg Balance score to > 45/56 to demonstrate improved balance and decreased fall risk during functional activities and  ADLs.  Baseline: 09/23/2023: 14/56 7/31: 20/56 using RW support 11/26/2023: 17/56 without AD support 12/31/2023: 21/56 without AD support Goal status: IN PROGRESS  4.  Patient will increase 10 meter walk test to >0.9m/s as to improve gait speed for better community ambulation and to reduce fall risk. Baseline: 09/09/2023: 0.103 m/s ( and 37 seconds) using bari-RW with skilled heavy min A and +2 w/c follow for safety  09/19/2023: 0.3m/s using bari-RW with skilled min A of 1 and +2 w/c follow for safety 10/22/2023: 0.1875 m/s using bari-RW with L hand splint, L swedish knee cage, and only skilled light min A of 1 (no wheelchair follow)  8/12: 0.117m/s with RW, and L hand splint. No knee cage on this day.  11/26/2023: Average Normal speed: 0.20 m/s & Average Fast speed: 0.275 m/s using bari-RW with CGA/min A for balance wearing [facility] 12/17/2023: Average Normal speed: 0.32 m/s using bari-RW with CGA & Average Fast speed: 0.36 m/s using bari-RW requiring CGA/light min A  12/31/2023: Average Normal speed: 0.3275 m/s using bari-RW with CGA/light min A for steadying Goal status: IN PROGRESS  5.  Patient will reduce timed up and go to <11 seconds to reduce fall risk and demonstrate improved transfer/gait ability. Baseline: unable to complete turn at 10 ft. Mod-max assist due to poor control of LW on RW, resulting in L lateral LOB  09/09/2023: and 32 seconds using bari-RW with L hand splint and +2 on R side for safety, therapist providing skilled min A on L side (Had pt set-up L hand on RW splint, get L foot in proper positioning, and scoot forward in seat before starting the timer) 09/19/2023: 78min43 seconds using bari-RW with only skilled min A of 1 (Had pt set-up L hand on RW splint, get L foot in proper positioning, and scoot forward in seat before starting the timer) 7/2: 78sec(1:18.125) with RW and min assist overall.  10/22/2023: 1 min, 30 seconds using bari-RW with L hand splint and skilled light min  assist  8/12: 1:01 min with RW and L hand splint.  11/26/2023: 51.57 seconds using bari-RW with hand splint, wearing SKC, and CGA-minA for balance 12/31/2023: 43.88 seconds using bari-RW with hand splint, wearing SKC, and CGA/occasional light min A   Goal status: IN PROGRESS   ASSESSMENT:  CLINICAL IMPRESSION:     Patient has 3 approved visits from 03/10/24 per insurance. Patient is aware but is not yet at independent functional level. Patient is having difficulty with obtaining a power wheelchair. He is challenged with turning with LiteGait and BrW with hand support for LUE. Backwards ambulation is very challenging for patient to perform and fatigues quickly, frequent cueing required for posterior step.  Pt will continue to benefit from therapy services to improve gait mechanics and endurance levels with ambulation as well.     OBJECTIVE IMPAIRMENTS: Abnormal gait, cardiopulmonary status limiting activity, decreased activity tolerance, decreased balance, decreased cognition, decreased coordination, decreased endurance, decreased knowledge of condition, decreased knowledge of use of DME, decreased mobility, difficulty walking, decreased ROM, decreased strength, decreased safety awareness, dizziness, hypomobility, increased fascial restrictions, impaired perceived functional ability, increased muscle spasms, impaired sensation, impaired tone, impaired UE functional use, impaired vision/preception, improper body mechanics, postural dysfunction, and obesity.   ACTIVITY LIMITATIONS: carrying, lifting, bending, sitting, standing, squatting, stairs,  transfers, bed mobility, bathing, toileting, dressing, reach over head, hygiene/grooming, locomotion level, and caring for others  PARTICIPATION LIMITATIONS: meal prep, cleaning, laundry, medication management, interpersonal relationship, driving, shopping, community activity, and yard work  PERSONAL FACTORS: Age, Past/current experiences, Time since onset of  injury/illness/exacerbation, and 3+ comorbidities: MG, HTN, CVA, lymphedema are also affecting patient's functional outcome.   REHAB POTENTIAL: Good  CLINICAL DECISION MAKING: Unstable/unpredictable  EVALUATION COMPLEXITY: High  PLAN:  PT FREQUENCY: 1-2x/week  PT DURATION: 8 weeks  PLANNED INTERVENTIONS: 02835- PT Re-evaluation, 97750- Physical Performance Testing, 97110-Therapeutic exercises, 97530- Therapeutic activity, [id number]- Neuromuscular re-education, 97535- Self Care, 02859- Manual therapy, [id number]- Gait training, [phone]- Orthotic Initial, [phone]- Orthotic/Prosthetic subsequent, [phone]- Canalith repositioning, [id number]- Electrical stimulation (unattended), [phone]- Electrical stimulation (manual), [phone]- Wound care (first 20 sq cm), 97598- Wound care (each additional 20 sq cm), Patient/Family education, Balance training, Stair training, Taping, Dry Needling, Joint mobilization, Joint manipulation, Vestibular training, Visual/preceptual remediation/compensation, Cognitive remediation, DME instructions, Wheelchair mobility training, Cryotherapy, and Moist heat  PLAN FOR NEXT SESSION:     Add STS to HEP HIGT (high intensity gait training) using litegait harness either on treadmill vs overground Continue overground gait training in litegait harness at next visit - decreasing reliance on R UE support Continue stair navigation training Dynamic gait training using bari-RW vs litegait Backwards Side stepping Obstacle navigation (turning) Working on forward propulsion with increased L hip extension and forward progression of pelvis over L stance phase Dynamic standing balance of trunk rotation Continue monitoring SKC fit & see if posterior knee strap needs to be adjusted   Hobie Kohles  Leopoldo, PT, DPT Physical Therapist - [facility]  Outpatient Physical Therapy- Main Campus [phone]    03/11/2024, 8:58 AM

## 2024-03-09 NOTE — Telephone Encounter (Signed)
 Goodmorning Fidela, I just called cynthia and she said she just sent them a few moments ago she figured we hadn't received them.

## 2024-03-10 ENCOUNTER — Ambulatory Visit

## 2024-03-10 DIAGNOSIS — I69354 Hemiplegia and hemiparesis following cerebral infarction affecting left non-dominant side: Secondary | ICD-10-CM

## 2024-03-10 DIAGNOSIS — M6281 Muscle weakness (generalized): Secondary | ICD-10-CM | POA: Diagnosis not present

## 2024-03-10 DIAGNOSIS — R278 Other lack of coordination: Secondary | ICD-10-CM

## 2024-03-10 DIAGNOSIS — R262 Difficulty in walking, not elsewhere classified: Secondary | ICD-10-CM

## 2024-03-11 NOTE — Therapy (Signed)
 Outpatient Occupational Therapy Neuro Treatment Note  Patient Name: [name]. MRN: [id number] DOB:[birth year], 73 y.o., male Today's Date: 03/11/2024  PCP: Alm Rower, MD REFERRING PROVIDER: Hermann Toribio PARAS, PA-C  END OF SESSION:  OT End of Session - 03/11/24 1535     Visit Number 57    Number of Visits 72    Date for Recertification  03/24/24    Progress Note Due on Visit 60    OT Start Time 1400    OT Stop Time 1445    OT Time Calculation (min) 45 min    Activity Tolerance Patient tolerated treatment well    Behavior During Therapy WFL for tasks assessed/performed         Past Medical History:  Diagnosis Date   Anemia    Cervical myelopathy (HCC)    Chronic venous insufficiency    [name] (chronic kidney disease), stage III (HCC)    Diabetes mellitus without complication (HCC)    GERD (gastroesophageal reflux disease)    Hypercholesteremia    Hypothyroidism    Kidney stones    about 50 in the past--last in March 4/24   Leg weakness, bilateral    due to Myasthenia Gravis   Myasthenia gravis (HCC)    Spinal stenosis    Vitamin B 12 deficiency    Vitamin B 12 deficiency    Wears hearing aid    bilateral   Past Surgical History:  Procedure Laterality Date   BACK SURGERY  09/2015   CARDIAC CATHETERIZATION     CATARACT EXTRACTION W/ INTRAOCULAR LENS IMPLANT     CATARACT EXTRACTION W/PHACO Right 12/21/2015   Procedure: CATARACT EXTRACTION PHACO AND INTRAOCULAR LENS PLACEMENT (IOC);  Surgeon: Dene Etienne, MD;  Location: [facility];  Service: Ophthalmology;  Laterality: Right;  DIABETIC - insulin  and oral meds   COLONOSCOPY     SHOULDER SURGERY     labrum repair   TONSILLECTOMY     Patient Active Problem List   Diagnosis Date Noted   Acquired left foot drop 01/10/2024   Left spastic hemiplegia (HCC) 01/10/2024   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) 10/11/2023   High blood pressure  08/13/2023   Anemia 08/13/2023   Constipation 08/13/2023   Myasthenia gravis (HCC) 08/13/2023   Acute ischemic right MCA stroke (HCC) 07/19/2023   Lymphedema 12/27/2016   Leg pain 07/15/2016   Chronic venous insufficiency 07/15/2016   Swelling of limb 07/15/2016   Type 2 diabetes mellitus (HCC) 07/15/2016   Hyperlipidemia 07/15/2016   ONSET DATE: 07/13/2023  REFERRING DIAG: Acute ischemic R MCA CVA  THERAPY DIAG: muscle weakness (generalized), other lack of coordination, vision disturbance, hemiplegia and hemiparesis following cerebral infarction affecting left non-dominant side (HCC)  Rationale for Evaluation and Treatment: Rehabilitation  SUBJECTIVE:  SUBJECTIVE STATEMENT: Pt reports some fatigue in his back muscles today after standing and reaching at beginning of session.     Pt accompanied by: [name]  PERTINENT HISTORY: Pt. Was admitted to [facility] on 07/13/2023 after sustaining a CVA while on a trip to the beach. Pt. Was diagnosed with R MCA CVA. Pt. Was admitted to inpatient rehab from 07/19/2023-08/13/2023. Past medical history includes high BP, anemia, and Myasthenia Gravis.   PRECAUTIONS: None  WEIGHT BEARING RESTRICTIONS: No  PAIN: 03/10/24: No pain  FALLS: Has patient fallen in last 6 months? Yes  LIVING ENVIRONMENT: Lives with: lives with their family and lives with their spouse-  Lives in: House/apartment- 2  story home and resides mostly on the 1st floor Stairs: No- Ramped entrance Has following equipment at home:  Vannie, cane, Steadi, shower chair, handheld shower head, bedside commode  PLOF: Independent and Independent with basic ADLs Independent at home   PATIENT GOALS: Would like to walk 10-20 feet each time. 01/16/24: Update: pt has restarted PT visits and will be continuing to focus on mobility in those sessions.  Pt continues to work towards improving functional use of the LUE during OT visits.   OBJECTIVE:  Note: Objective measures were completed  at Evaluation unless otherwise noted.  HAND DOMINANCE: Right  ADLs: Overall ADLs:  Transfers/ambulation related to ADLs: Eating: Independent, 100% R handed Grooming: independent UB Dressing: Can put on shirt independently LB Dressing: Difficulty with pants and requires assistance getting the pants on his feet, has difficulty putting on shoes and socks Toileting: Tot A for toilet hygiene. Uses Steadi during toilet transfers. Bathing: Requires assist with using the L arm to wash the R arm. Tub Shower transfers: Roll in shower, built in shower chair, grab bars and hand held shower head.  IADLs: Shopping: Total A (Wife typically did the shopping prior to onset)   Light housekeeping: Total A (wife typically performs prior to onset) Meal Prep: Independent with light snack prep Community mobility: No driving, able to get in/out of the car Medication management: Set up assistance from his wife using a pill box (pt. Is responsible for taking medications at the correct time) Financial management: family members check behind him. Handwriting: TBD Hobbies: gardening, wood working and sports- [employer] Work history: Owns a tax business  MOBILITY STATUS: Needs Assist:    POSTURE COMMENTS:  Sitting balance: Good supported sitting balance  FUNCTIONAL OUTCOME MEASURES:  Fugl-Meyer: see below  UPPER EXTREMITY ROM:    Active ROM Right Eval  [facility] Right 09/23/2023 [facility] Left eval Left 09/23/2023 Left 10/29/23 Left 12/02/23 Left 12/31/23 Left 02/11/24  Shoulder flexion   89(110) 109(114) 111(115) 90 before scaption (110) 95 before scaption (110) 100 before scaption (110)  Shoulder abduction   98(120) 109(120) 111(122) 100 (110) 105(118) 107(121)  Shoulder adduction          Shoulder extension          Shoulder internal rotation          Shoulder external rotation          Elbow flexion   120(140) 130(136) 141(145)  146 146  Elbow extension   -28(-10) -5(0) -18(0)  -3(0) 0  Wrist flexion    60(60) Rest at 60 50(65) 54(60)     Wrist extension   -48(42) 10(40) 20(50) 30 (45) 30(45) 45(54)  Wrist ulnar deviation     12(22)     Wrist radial deviation     10(20)     Wrist pronation     (82)90   85  Wrist supination     64(74) 70 (75)  34(74)  (Blank rows = not tested)  Eval: L Digit flexion: 2nd:3cm (0cm) 3rd: 0cm (0cm), 4th: 0cm (0cm), 5th: 2cm (0cm)  09/23/23:  L Digit flexion to [facility]: 2nd: 3.5cm (0cm), 3rd: 4 cm(0cm), 4th 3.5cm (0cm), 5th: 2cm (0cm)  Eval:  L Digit extension: Active Gross digit extension through 10% of ROM  09/23/23:  L Digit extension: Active Gross digit extension through 50% of ROM  10/29/23:  L Digit extension: Active Gross digit extension through 25-50% of ROM  12/02/23:  L digit extension: Active gross ext through 30%  12/31/23:  L digit extension:  Active gross ext through ~30-40%   02/11/24:  Left full composite fist  L digit extension:  Active gross ext through 60% range  UPPER EXTREMITY MMT:     MMT Right eval Left eval Left 09/23/2023 Left 10/29/23 Left 12/02/23 Left 12/31/23 Left 02/11/24  Shoulder flexion 5 3- 3-/5 3-/5  3-/5 3-/5  Shoulder abduction 5 3- 3-/5 3-/5  3-5 3-/5  Shoulder adduction         Shoulder extension         Shoulder internal rotation         Shoulder external rotation         Middle trapezius         Lower trapezius         Elbow flexion 5 TBD 3+/5 4-/5 4+/5 4+/5 4+/5  Elbow extension 5 TBD 4-/5 3-/5 4+/5 4+/5 4+/5  Wrist flexion   2/5 3-/5 3-    Wrist extension  2- 2/5 2+/5 3- 3-/5 3-/5  Wrist ulnar deviation         Wrist radial deviation         Wrist pronation    3/5 3+  3+/5  Wrist supination    3-/5 3-  3-/5  (Blank rows = not tested)  HAND FUNCTION:  Eval:  Grip strength: Right: 94#, Left: 1#,  Lateral Key Pinch strength: Right: 14#, Left: 3#  10/29/23:  Grip strength: Left: 16# Lateral Key Pinch strength: Left: 5#  12/02/23: L: 5 lbs, R: 98 # Lateral pinch: Left: 5 lbs    12/31/23:   Grip strength: Right: 105#, Left: 14#,   02/11/24:   Grip strength: Left: 22#  COORDINATION: Box and Blocks: Right: 34 blocks completed in 1 min., Left: 0 blocks completed in 1 min.   10/29/23:  Box and Blocks: Right: 34 blocks completed in 1 min., Left: 2 blocks completed in 1 min.  9 Hole Peg Test: Left: To remove 9 pegs from a vertical position: 1 min. &13 sec.  12/02/23: Box and Blocks: 4 blocks 12/02/23: 4 pegs out in 3 min (multiple pulled and dropped out of the cup)   12/02/23: Box and Blocks Test: 5 blocks in 1 min.  02/10/24: Box and Blocks Test: 5 blocks in 1 min.  02/11/24: 1 min. & 18 sec. to remove 9 pegs.  Box and Blocks: 1 block in 1 min.   FUGL-MEYER ASSESSMENT   A. Upper Extremity   I. Reflex Activity:   12/12/23   Flexors (biceps) 2   Extensors (triceps) 2   Total 4/4                 II. Volitional Movement within Synergies:   12/12/23   Flexor Synergy:      Shoulder Elevation 2   Shoulder Retraction 2   Shoulder Abduction (at least 90*) 2   External Rotation 1   Elbow Flexion 2   Forearm Supination 1   Extensor Synergy:      Shoulder Adduction/Internal Rotation 2   Elbow Extension 1   Forearm Pronation 2   Total 15/18     III. Movement Combining Synergies:   12/12/23   Hand to Lumbar Spine 1   Shoulder Flexion at 90*, elbow at 0* 0   Pron/Sup w/elbow at 90* and shoulder at 0* 1   Total 2/6     IV. Movement Out of Synergy   12/12/23   Shoulder Abduction to 90*, elbow at 0*,  forearm pronated 0   Shoulder Flexion 9)-180*, elbow at 0*, forearm in mid position 0   Pron/sup, elbow at 0* and shoulder between 30-90* of flexion 0   Total 0/6     B. Wrist   12/12/23   Stability of wrist at 15* ext, elbow at 90*, shoulder 0* 2   Repeated flex/ext, elbow at 90*, shoulder 0* 1   Stability of wrist at 15* ext, elbow at 0*, shoulder 30* 0   Flex/ext, elbow 0*, shoulder 30* 0   Circumduction  1   Total 4/10     C. Hand   12/12/23    Mass Flexion 1   Mass Extension 1   Total 2/4     D. Grasp   12/12/23   Hook  0   Thumb Adduction - paper 1   Pincer - pen 0   Cylindrical - cup/can 0   Spherical - tennis ball 1   Total 2/10     E. Coordination/Speed   12/12/23   Tremor 2   Dysmetria 0   Time 1   Total 3/6     FUGL-MEYER ASSESSMENT: UPPER EXTREMITY A-E TOTAL on (date): 32/66   SENSATION: Sensation feels different in both hands.  L hand Light touch- impaired L hand Proprioception- impaired  EDEMA: Has had hx of edema, and came to Eval session with swelling in the L hand/wrist/forearm.   *pt. has a laceration from his dog on the dorsal aspect of the L hand.   MUSCLE TONE: Increased tone through the UE and hand flexors.   COGNITION: Overall cognitive status: Within functional limits for tasks assessed  VISION: Subjective report: L sided inattention Baseline vision: Wears glasses for reading only Visual history: Right visual occlusion  VISION ASSESSMENT: To be assessed  PERCEPTION: TBD  PRAXIS: Impaired                                                                                                                 TREATMENT DATE: 03/10/2024 Neuro re-ed: -Alternated WB through the wrist and hand in sitting and standing with added lateral WS L/R for increased proprioceptive feedback through the LUE/LLE, working to normalize spasticity for tasks noted below; min guard for balance during lateral WS.   -LUE grasp/release task practice working to move [employer] between levels 1 and 2 of [place].  Challenge increased with pt completing activity in standing position with min CGA.  Min-mod A to secure grasp and successfully release rings when moving rings between levels of tower d/t L hand weakness and increased flexor tone.   Therapeutic Activity: -Facilitated lateral pinching, wrist and digit ext, and forward and lateral reaching patterns working to grasp and release piece of paper (sticky note) at  targeted points on and off table top.  Promoted increased digit and wrist ext providing cues for grasping and reaching for paper with pronated forearm position.    Estim attended: Tx time: 15 min, 36-38 mA CC, duty cycle 50%, ramp 5 sec, cycle time 10/10;  electrodes placed at proximal and distal forearm, dorsal aspect, to promote wrist and digit extension.  OT provided passive stretching during stim cycle to maximize end range digit ext at the MP, PIP, and DIP joints of digits 2-5 in prep for grasping paper.  Stim used in prep for and in combination with activity noted above (grasping sticky note).    Education: Education details:  Increasing WB through the LUE frequently throughout the day for spasticity management  Person educated: Patient Education method: Explanation, Demonstration, Tactile cues, and Verbal cues Education comprehension: verbalized understanding, returned demonstration, verbal cues required, and tactile cues required, further training needed  HOME EXERCISE PROGRAM: -grasping and stacking 1, and 1/2 cubes form the tabletop surface; yellow theraband for LUE strengthening   GOALS: Goals reviewed with patient? Yes  SHORT TERM GOALS: Target date: 02/11/2024   Pt. Will be independent with HEP for the LUE.  Baseline: 02/11/24: Independent 12/31/23: Independent; ongoing 12/03/23: indep with current HEP, though ongoing progressions are being made as UE function improves; 10/29/23: Pt. requires assist from wife with HEPs. Pt. Is consistently working on HEP's at home. 09/19/23: Requires assist from his wife Eval; no current HEP Goal status: Achieved,Ongoing  LONG TERM GOALS: Target date: 03/24/2024  Pt. Will increase L shoulder flexion by 10 degrees to be able to reach up to shelves. Baseline: 02/11/24: 100 before scaption (110) 12/31/23: Left: 95 degrees before scaption, 110 of scaption 12/03/23: 90* before onset of scaption; 10/29/23: 111(115) 09/19/23: Pt. Continues to be limited with  reaching up to shelves. Eval: L shoulder flexion is 89 (110). Goal status: progressing, Ongoing  2.  Pt. Will increase L shoulder abduction by 10 degrees to assist with underarm hygiene.  Baseline: 02/11/24: 892(878) 12/31/23: Left: 894(881) 12/03/23: 100*; pt able to demo bilat underarm hygiene with set up; 10/29/23: 888(877) 09/19/23: Pt. Continues to require assist with underarm hygiene.Eval: L shoulder abduction is 98 (120).  Goal status: Progressing, ongoing  3.  Pt. Will improve L wrist extension by 10 degrees to be able to initiate anticipation of grasping for objects.  Baseline: 02/11/24: 45(54) 12/31/23: 30(45) 12/03/23: 30* (45); 10/29/23: 20(55) 09/19/23: Consistent activation noted in the left wrist extensors with facilitation. Eval: -48 (42) Goal status: Ongoing  4.  Pt. Will improve active gross digit extension through 50% of the range to be able to release objects in his hand consistently.  Baseline:02/11/24: L digit extension: Active gross ext through 60% range 12/31/23: Gross digit extension: ~30-40% 12/03/23: Gross digit ext through ~30% of range (limited by flexor tone in the PIPs); 10/28/20: Gross digit extension through 25-50% of the range. 09/19/23: gross digit extension noted through 25% range with facilitation.Eval: gross digit extension is 10% of the range  Goal status: Ongoing  5.  Pt. Will independently demonstrate visual compensatory strategies when navigating through environment and during tabletop tasks. Baseline: 12/03/23: Pt demonstrates consistent head turns to scan to L visual field without cueing; 10/29/2023: Pt. Continues to less cuing for left sided awareness. 09/19/23: Pt. requires fewer cues for left sided awareness Eval: Pt. Requires consistent cuing for L sided awareness.  Goal status: achieved   6. Pt. Will improve left hand [facility] skills to be able to independently manipulate small objects during ADLs, and IADLs. Baseline: 02/11/24: 9 Hole Peg Test:1 min. & 18 sec. to  remove 9 pegs.12/31/23: Pt. Is progressing to be able to grasp progressively smaller objects. 12/03/23: Pt removed 3 pegs in >3 min, pulling out more than 3 but repeatedly dropping  to floor or table top rather than assessment dish; 10/29/23: 9 Hole Peg Test: Pt. Was able to remove 9 pegs from vertical position on the pegboard in 1 min. & 13 sec. Pt. Is not yet able to pick up pegs from a horizontal position to place them in the pegboard.   Goal status: ongoing   7. Pt will improve left hand function skills as evidenced by improved score to 4 blocks on the Box and Blocks test. Baseline: 02/11/24 Box and Blocks: 1 block in 1 min. 12/31/23: 4 blocks in 1 min. 12/03/23: 4 blocks after 3 trials and passive wrist and digit stretching between trials (not yet consistent to achieve 4 blocks); 10/29/23: Box and Blocks Test: 2 Blocks completed in 1 min.   Goal Status: ongoing   ASSESSMENT:  CLINICAL IMPRESSION:  Standing tolerance limited today as compared to last 2 sessions.  Pt reported fatigue in back with standing, though chosen activity today provided increased physical demand with increased weight shifting, an increased extended reaching challenge, and increased range needed for digit extension to grasp/release Saebo rings as compared to lower activity demands in 2 previous sessions when pt practiced reaching for smaller items closer to body while in standing (velcro blocks).  Pt required mod A to release Saebo rings from hand d/t significant flexor tone and thickness of Saebo ring.  Estim tolerated well today, noting improved tone management during grasp/release task practice at table top, requiring fewer breaks to WB through the hand in between repetitions of grasping/releasing paper.  Pt was prompted to initiate digit ext in timing with on cycles of Estim when preparing to grasp and release paper, progressing to completion during off cycles.  Pt. continues to benefit from OT services to work on improving LUE  functioning to increase engagement of the LUE during daily tasks, and to provide education about compensatory strategies during ADLs/IADLs.   PERFORMANCE DEFICITS: in functional skills including ADLs, IADLs, coordination, dexterity, proprioception, sensation, edema, tone, ROM, strength, pain, Fine motor control, Gross motor control, mobility, balance, endurance, vision, and UE functional use, cognitive skills including perception, and psychosocial skills including coping strategies, environmental adaptation, and routines and behaviors.   IMPAIRMENTS: are limiting patient from ADLs, IADLs, and leisure.   CO-MORBIDITIES: may have co-morbidities  that affects occupational performance. Patient will benefit from skilled OT to address above impairments and improve overall function.  MODIFICATION OR ASSISTANCE TO COMPLETE EVALUATION: Min-Moderate modification of tasks or assist with assess necessary to complete an evaluation.  OT OCCUPATIONAL PROFILE AND HISTORY: Detailed assessment: Review of records and additional review of physical, cognitive, psychosocial history related to current functional performance.  CLINICAL DECISION MAKING: Moderate - several treatment options, min-mod task modification necessary  REHAB POTENTIAL: Good  EVALUATION COMPLEXITY: Moderate  PLAN:  OT FREQUENCY: 2x/week  OT DURATION: 12 weeks  PLANNED INTERVENTIONS: 97168 OT Re-evaluation, 97535 self care/ADL training, 02889 therapeutic exercise, 97530 therapeutic activity, 97112 neuromuscular re-education, 97140 manual therapy, 97018 paraffin, 02989 moist heat, 97010 cryotherapy, 97034 contrast bath, 97760 Orthotic Initial, 97763 Orthotic/Prosthetic subsequent, passive range of motion, visual/perceptual remediation/compensation, energy conservation, and DME and/or AE instructions  RECOMMENDED OTHER SERVICES: PT and ST  CONSULTED AND AGREED WITH PLAN OF CARE: Patient and family member/caregiver  PLAN FOR NEXT SESSION:  see above  Inocente Blazing, MS, OTR/L  03/11/2024, 3:37 PM

## 2024-03-12 ENCOUNTER — Ambulatory Visit

## 2024-03-12 ENCOUNTER — Ambulatory Visit: Admitting: Occupational Therapy

## 2024-03-12 DIAGNOSIS — R269 Unspecified abnormalities of gait and mobility: Secondary | ICD-10-CM

## 2024-03-12 DIAGNOSIS — R262 Difficulty in walking, not elsewhere classified: Secondary | ICD-10-CM

## 2024-03-12 DIAGNOSIS — M6281 Muscle weakness (generalized): Secondary | ICD-10-CM

## 2024-03-12 DIAGNOSIS — I69354 Hemiplegia and hemiparesis following cerebral infarction affecting left non-dominant side: Secondary | ICD-10-CM

## 2024-03-12 DIAGNOSIS — R278 Other lack of coordination: Secondary | ICD-10-CM

## 2024-03-12 DIAGNOSIS — R2681 Unsteadiness on feet: Secondary | ICD-10-CM

## 2024-03-12 NOTE — Therapy (Addendum)
 Outpatient Occupational Therapy Neuro Treatment Note  Patient Name: [name]. MRN: [id number] DOB:18-Jan-1951, 73 y.o., male Today's Date: 03/12/2024  PCP: Alm Rower, MD REFERRING PROVIDER: Hermann Toribio PARAS, PA-C  END OF SESSION:  OT End of Session - 03/12/24 2316     Visit Number 58    Number of Visits 72    Date for Recertification  03/24/24    OT Start Time 1400    OT Stop Time 1445    OT Time Calculation (min) 45 min    Activity Tolerance Patient tolerated treatment well    Behavior During Therapy WFL for tasks assessed/performed         Past Medical History:  Diagnosis Date   Anemia    Cervical myelopathy (HCC)    Chronic venous insufficiency    [name] (chronic kidney disease), stage III (HCC)    Diabetes mellitus without complication (HCC)    GERD (gastroesophageal reflux disease)    Hypercholesteremia    Hypothyroidism    Kidney stones    about 50 in the past--last in March 4/24   Leg weakness, bilateral    due to Myasthenia Gravis   Myasthenia gravis (HCC)    Spinal stenosis    Vitamin B 12 deficiency    Vitamin B 12 deficiency    Wears hearing aid    bilateral   Past Surgical History:  Procedure Laterality Date   BACK SURGERY  09/2015   CARDIAC CATHETERIZATION     CATARACT EXTRACTION W/ INTRAOCULAR LENS IMPLANT     CATARACT EXTRACTION W/PHACO Right 12/21/2015   Procedure: CATARACT EXTRACTION PHACO AND INTRAOCULAR LENS PLACEMENT (IOC);  Surgeon: Dene Etienne, MD;  Location: [facility];  Service: Ophthalmology;  Laterality: Right;  DIABETIC - insulin  and oral meds   COLONOSCOPY     SHOULDER SURGERY     labrum repair   TONSILLECTOMY     Patient Active Problem List   Diagnosis Date Noted   Acquired left foot drop 01/10/2024   Left spastic hemiplegia (HCC) 01/10/2024   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) 10/11/2023   High blood pressure 08/13/2023   Anemia 08/13/2023    Constipation 08/13/2023   Myasthenia gravis (HCC) 08/13/2023   Acute ischemic right MCA stroke (HCC) 07/19/2023   Lymphedema 12/27/2016   Leg pain 07/15/2016   Chronic venous insufficiency 07/15/2016   Swelling of limb 07/15/2016   Type 2 diabetes mellitus (HCC) 07/15/2016   Hyperlipidemia 07/15/2016   ONSET DATE: 07/13/2023  REFERRING DIAG: Acute ischemic R MCA CVA  THERAPY DIAG: muscle weakness (generalized), other lack of coordination, vision disturbance, hemiplegia and hemiparesis following cerebral infarction affecting left non-dominant side (HCC)  Rationale for Evaluation and Treatment: Rehabilitation  SUBJECTIVE:  SUBJECTIVE STATEMENT:  Pt. reports that they will not be here next week. Pt accompanied by: [name]  PERTINENT HISTORY: Pt. Was admitted to [facility] on 07/13/2023 after sustaining a CVA while on a trip to the beach. Pt. Was diagnosed with R MCA CVA. Pt. Was admitted to inpatient rehab from 07/19/2023-08/13/2023. Past medical history includes high BP, anemia, and Myasthenia Gravis.   PRECAUTIONS: None  WEIGHT BEARING RESTRICTIONS: No  PAIN: 03/10/24: No pain  FALLS: Has patient fallen in last 6 months? Yes  LIVING ENVIRONMENT: Lives with: lives with their family and lives with their spouse-  Lives in: House/apartment- 2 story home and resides mostly on the 1st floor Stairs: No- Ramped entrance Has following equipment at home:  Walker, cane, Steadi, shower chair, handheld shower head, bedside commode  PLOF: Independent and Independent with basic ADLs Independent at home   PATIENT GOALS: Would like to walk 10-20 feet each time. 01/16/24: Update: pt has restarted PT visits and will be continuing to focus on mobility in those sessions.  Pt continues to work towards improving functional use of the LUE during OT visits.   OBJECTIVE:  Note: Objective measures were completed at Evaluation unless otherwise noted.  HAND DOMINANCE: Right  ADLs: Overall ADLs:   Transfers/ambulation related to ADLs: Eating: Independent, 100% R handed Grooming: independent UB Dressing: Can put on shirt independently LB Dressing: Difficulty with pants and requires assistance getting the pants on his feet, has difficulty putting on shoes and socks Toileting: Tot A for toilet hygiene. Uses Steadi during toilet transfers. Bathing: Requires assist with using the L arm to wash the R arm. Tub Shower transfers: Roll in shower, built in shower chair, grab bars and hand held shower head.  IADLs: Shopping: Total A (Wife typically did the shopping prior to onset)   Light housekeeping: Total A (wife typically performs prior to onset) Meal Prep: Independent with light snack prep Community mobility: No driving, able to get in/out of the car Medication management: Set up assistance from his wife using a pill box (pt. Is responsible for taking medications at the correct time) Financial management: family members check behind him. Handwriting: TBD Hobbies: gardening, wood working and sports- [employer] Work history: Owns a tax business  MOBILITY STATUS: Needs Assist:    POSTURE COMMENTS:  Sitting balance: Good supported sitting balance  FUNCTIONAL OUTCOME MEASURES:  Fugl-Meyer: see below  UPPER EXTREMITY ROM:    Active ROM Right Eval  [facility] Right 09/23/2023 [facility] Left eval Left 09/23/2023 Left 10/29/23 Left 12/02/23 Left 12/31/23 Left 02/11/24  Shoulder flexion   89(110) 109(114) 111(115) 90 before scaption (110) 95 before scaption (110) 100 before scaption (110)  Shoulder abduction   98(120) 109(120) 111(122) 100 (110) 105(118) 107(121)  Shoulder adduction          Shoulder extension          Shoulder internal rotation          Shoulder external rotation          Elbow flexion   120(140) 130(136) 141(145)  146 146  Elbow extension   -28(-10) -5(0) -18(0)  -3(0) 0  Wrist flexion   60(60) Rest at 60 50(65) 54(60)     Wrist extension   -48(42) 10(40) 20(50) 30 (45) 30(45)  45(54)  Wrist ulnar deviation     12(22)     Wrist radial deviation     10(20)     Wrist pronation     (82)90   85  Wrist supination     64(74) 70 (75)  34(74)  (Blank rows = not tested)  Eval: L Digit flexion: 2nd:3cm (0cm) 3rd: 0cm (0cm), 4th: 0cm (0cm), 5th: 2cm (0cm)  09/23/23:  L Digit flexion to [facility]: 2nd: 3.5cm (0cm), 3rd: 4 cm(0cm), 4th 3.5cm (0cm), 5th: 2cm (0cm)  Eval:  L Digit extension: Active Gross digit extension through 10% of ROM  09/23/23:  L Digit extension: Active Gross digit extension through 50% of ROM  10/29/23:  L Digit extension: Active Gross digit extension through 25-50% of ROM  12/02/23:  L digit extension: Active gross ext through 30%   12/31/23:  L digit extension:  Active gross ext through ~30-40%   02/11/24:  Left full composite fist  L digit extension:  Active gross ext through 60% range  UPPER EXTREMITY MMT:     MMT Right eval Left eval Left 09/23/2023 Left 10/29/23 Left 12/02/23 Left 12/31/23 Left 02/11/24  Shoulder flexion 5 3- 3-/5 3-/5  3-/5 3-/5  Shoulder abduction 5 3- 3-/5 3-/5  3-5 3-/5  Shoulder adduction         Shoulder extension         Shoulder internal rotation         Shoulder external rotation         Middle trapezius         Lower trapezius         Elbow flexion 5 TBD 3+/5 4-/5 4+/5 4+/5 4+/5  Elbow extension 5 TBD 4-/5 3-/5 4+/5 4+/5 4+/5  Wrist flexion   2/5 3-/5 3-    Wrist extension  2- 2/5 2+/5 3- 3-/5 3-/5  Wrist ulnar deviation         Wrist radial deviation         Wrist pronation    3/5 3+  3+/5  Wrist supination    3-/5 3-  3-/5  (Blank rows = not tested)  HAND FUNCTION:  Eval:  Grip strength: Right: 94#, Left: 1#,  Lateral Key Pinch strength: Right: 14#, Left: 3#  10/29/23:  Grip strength: Left: 16# Lateral Key Pinch strength: Left: 5#  12/02/23: L: 5 lbs, R: 98 # Lateral pinch: Left: 5 lbs   12/31/23:   Grip strength: Right: 105#, Left: 14#,   02/11/24:   Grip strength: Left:  22#  COORDINATION: Box and Blocks: Right: 34 blocks completed in 1 min., Left: 0 blocks completed in 1 min.   10/29/23:  Box and Blocks: Right: 34 blocks completed in 1 min., Left: 2 blocks completed in 1 min.  9 Hole Peg Test: Left: To remove 9 pegs from a vertical position: 1 min. &13 sec.  12/02/23: Box and Blocks: 4 blocks 12/02/23: 4 pegs out in 3 min (multiple pulled and dropped out of the cup)   12/02/23: Box and Blocks Test: 5 blocks in 1 min.  02/10/24: Box and Blocks Test: 5 blocks in 1 min.  02/11/24: 1 min. & 18 sec. to remove 9 pegs.  Box and Blocks: 1 block in 1 min.   FUGL-MEYER ASSESSMENT   A. Upper Extremity   I. Reflex Activity:   12/12/23   Flexors (biceps) 2   Extensors (triceps) 2   Total 4/4                 II. Volitional Movement within Synergies:   12/12/23   Flexor Synergy:      Shoulder Elevation 2   Shoulder Retraction 2   Shoulder Abduction (at least 90*) 2   External Rotation 1   Elbow Flexion 2   Forearm Supination 1   Extensor Synergy:      Shoulder Adduction/Internal Rotation 2   Elbow Extension 1   Forearm Pronation 2   Total 15/18     III. Movement Combining Synergies:   12/12/23   Hand to Lumbar Spine 1   Shoulder Flexion at 90*, elbow at 0* 0   Pron/Sup w/elbow at 90* and shoulder at 0* 1   Total 2/6     IV. Movement Out of Synergy   12/12/23   Shoulder Abduction to 90*, elbow at 0*, forearm pronated 0   Shoulder Flexion 9)-180*, elbow at 0*, forearm in mid position 0   Pron/sup, elbow  at 0* and shoulder between 30-90* of flexion 0   Total 0/6     B. Wrist   12/12/23   Stability of wrist at 15* ext, elbow at 90*, shoulder 0* 2   Repeated flex/ext, elbow at 90*, shoulder 0* 1   Stability of wrist at 15* ext, elbow at 0*, shoulder 30* 0   Flex/ext, elbow 0*, shoulder 30* 0   Circumduction  1   Total 4/10     C. Hand   12/12/23   Mass Flexion 1   Mass Extension 1   Total 2/4     D. Grasp   12/12/23   Hook  0   Thumb  Adduction - paper 1   Pincer - pen 0   Cylindrical - cup/can 0   Spherical - tennis ball 1   Total 2/10     E. Coordination/Speed   12/12/23   Tremor 2   Dysmetria 0   Time 1   Total 3/6     FUGL-MEYER ASSESSMENT: UPPER EXTREMITY A-E TOTAL on (date): 32/66   SENSATION: Sensation feels different in both hands.  L hand Light touch- impaired L hand Proprioception- impaired  EDEMA: Has had hx of edema, and came to Eval session with swelling in the L hand/wrist/forearm.   *pt. has a laceration from his dog on the dorsal aspect of the L hand.   MUSCLE TONE: Increased tone through the UE and hand flexors.   COGNITION: Overall cognitive status: Within functional limits for tasks assessed  VISION: Subjective report: L sided inattention Baseline vision: Wears glasses for reading only Visual history: Right visual occlusion  VISION ASSESSMENT: To be assessed  PERCEPTION: TBD  PRAXIS: Impaired                                                                                                                 TREATMENT DATE: 03/12/2024  Manual therapy:   -STM was performed to the left scapular musculature -Manual therapy was performed independent of, and in preparation for therapeutic Ex.     Therapeutic Exercise:   -AROM/AAROM/PROM for the LUE including left shoulder flexion, abduction, horizontal abduction, elbow flexion, extension, forearm supination, pronation, wrist extension, digit MP/PIP/and DIP extension to increase ROM, and decrease stiffness  Neuromuscular re-education:  -Facilitated left hand weightbearing, and proprioception through the LUE, and with a flat hand. -Pt. Education was provided about preparing the left hand for a position of weightbearing with a flat hand using the right hand.   Therapeutic Activities:   -Facilitated diagonal patterns of movement with the LUE in  preparation for self-grooming tasks with assist, and facilitation of forearm  supinators. -Facilitated Left FMC skills grasping 1 cubes from the resistive board using a lateral grasp with the left hand . -Facilitated formation of isolated 2nd digit extension pressing the 1 cubes in place with the board placed flat at the tabletop surface.     Education: Education details:  Pt./caregiver education about opportunities for weightbearing at home., as well as preparing the  hand for assuming weighberaing. Person educated: Patient Education method: Explanation, Demonstration, Tactile cues, and Verbal cues Education comprehension: verbalized understanding, returned demonstration, verbal cues required, and tactile cues required, further training needed  HOME EXERCISE PROGRAM: -grasping and stacking 1, and 1/2 cubes form the tabletop surface; yellow theraband for LUE strengthening   GOALS: Goals reviewed with patient? Yes  SHORT TERM GOALS: Target date: 02/11/2024   Pt. Will be independent with HEP for the LUE.  Baseline: 02/11/24: Independent 12/31/23: Independent; ongoing 12/03/23: indep with current HEP, though ongoing progressions are being made as UE function improves; 10/29/23: Pt. requires assist from wife with HEPs. Pt. Is consistently working on HEP's at home. 09/19/23: Requires assist from his wife Eval; no current HEP Goal status: Achieved,Ongoing  LONG TERM GOALS: Target date: 03/24/2024  Pt. Will increase L shoulder flexion by 10 degrees to be able to reach up to shelves. Baseline: 02/11/24: 100 before scaption (110) 12/31/23: Left: 95 degrees before scaption, 110 of scaption 12/03/23: 90* before onset of scaption; 10/29/23: 111(115) 09/19/23: Pt. Continues to be limited with reaching up to shelves. Eval: L shoulder flexion is 89 (110). Goal status: progressing, Ongoing  2.  Pt. Will increase L shoulder abduction by 10 degrees to assist with underarm hygiene.  Baseline: 02/11/24: 892(878) 12/31/23: Left: 894(881) 12/03/23: 100*; pt able to demo bilat underarm  hygiene with set up; 10/29/23: 888(877) 09/19/23: Pt. Continues to require assist with underarm hygiene.Eval: L shoulder abduction is 98 (120).  Goal status: Progressing, ongoing  3.  Pt. Will improve L wrist extension by 10 degrees to be able to initiate anticipation of grasping for objects.  Baseline: 02/11/24: 45(54) 12/31/23: 30(45) 12/03/23: 30* (45); 10/29/23: 20(55) 09/19/23: Consistent activation noted in the left wrist extensors with facilitation. Eval: -48 (42) Goal status: Ongoing  4.  Pt. Will improve active gross digit extension through 50% of the range to be able to release objects in his hand consistently.  Baseline:02/11/24: L digit extension: Active gross ext through 60% range 12/31/23: Gross digit extension: ~30-40% 12/03/23: Gross digit ext through ~30% of range (limited by flexor tone in the PIPs); 10/28/20: Gross digit extension through 25-50% of the range. 09/19/23: gross digit extension noted through 25% range with facilitation.Eval: gross digit extension is 10% of the range  Goal status: Ongoing  5.  Pt. Will independently demonstrate visual compensatory strategies when navigating through environment and during tabletop tasks. Baseline: 12/03/23: Pt demonstrates consistent head turns to scan to L visual field without cueing; 10/29/2023: Pt. Continues to less cuing for left sided awareness. 09/19/23: Pt. requires fewer cues for left sided awareness Eval: Pt. Requires consistent cuing for L sided awareness.  Goal status: achieved   6. Pt. Will improve left hand [facility] skills to be able to independently manipulate small objects during ADLs, and IADLs. Baseline: 02/11/24: 9 Hole Peg Test:1 min. & 18 sec. to remove 9 pegs.12/31/23: Pt. Is progressing to be able to grasp progressively smaller objects. 12/03/23: Pt removed 3 pegs in >3 min, pulling out more than 3 but repeatedly dropping to floor or table top rather than assessment dish; 10/29/23: 9 Hole Peg Test: Pt. Was able to remove 9 pegs from  vertical position on the pegboard in 1 min. & 13 sec. Pt. Is not yet able to pick up pegs from a horizontal position to place them in the pegboard.   Goal status: ongoing   7. Pt will improve left hand function skills as evidenced by improved score to 4 blocks on  the Box and Blocks test. Baseline: 02/11/24 Box and Blocks: 1 block in 1 min. 12/31/23: 4 blocks in 1 min. 12/03/23: 4 blocks after 3 trials and passive wrist and digit stretching between trials (not yet consistent to achieve 4 blocks); 10/29/23: Box and Blocks Test: 2 Blocks completed in 1 min.   Goal Status: ongoing   ASSESSMENT:  CLINICAL IMPRESSION:   Pt. reports that they will not be here next week. Pt. requires hand-over-hand assist to move the LUE through diagonal patterns, as well as the addition of supination during the movement pattern.  Pt. requires assist to prepare the left hand to assume a position of weightbearing using the right hand. Pt. continues to work on grasping 1/2 resistive cubes. Pt. requires assist to formulate isolated 2nd digit extension.  Plan to complete recert next visit. Pt. continues to benefit from OT services to work on improving LUE functioning to increase engagement of the LUE during daily tasks, and to provide education about compensatory strategies during ADLs/IADLs.   PERFORMANCE DEFICITS: in functional skills including ADLs, IADLs, coordination, dexterity, proprioception, sensation, edema, tone, ROM, strength, pain, Fine motor control, Gross motor control, mobility, balance, endurance, vision, and UE functional use, cognitive skills including perception, and psychosocial skills including coping strategies, environmental adaptation, and routines and behaviors.   IMPAIRMENTS: are limiting patient from ADLs, IADLs, and leisure.   CO-MORBIDITIES: may have co-morbidities  that affects occupational performance. Patient will benefit from skilled OT to address above impairments and improve overall  function.  MODIFICATION OR ASSISTANCE TO COMPLETE EVALUATION: Min-Moderate modification of tasks or assist with assess necessary to complete an evaluation.  OT OCCUPATIONAL PROFILE AND HISTORY: Detailed assessment: Review of records and additional review of physical, cognitive, psychosocial history related to current functional performance.  CLINICAL DECISION MAKING: Moderate - several treatment options, min-mod task modification necessary  REHAB POTENTIAL: Good  EVALUATION COMPLEXITY: Moderate  PLAN:  OT FREQUENCY: 2x/week  OT DURATION: 12 weeks  PLANNED INTERVENTIONS: 97168 OT Re-evaluation, 97535 self care/ADL training, 02889 therapeutic exercise, 97530 therapeutic activity, 97112 neuromuscular re-education, 97140 manual therapy, 97018 paraffin, 02989 moist heat, 97010 cryotherapy, 97034 contrast bath, 97760 Orthotic Initial, 97763 Orthotic/Prosthetic subsequent, passive range of motion, visual/perceptual remediation/compensation, energy conservation, and DME and/or AE instructions  RECOMMENDED OTHER SERVICES: PT and ST  CONSULTED AND AGREED WITH PLAN OF CARE: Patient and family member/caregiver  PLAN FOR NEXT SESSION: see above  Richardson Otter, MS, OTR/L   03/12/2024, 11:19 PM

## 2024-03-12 NOTE — Therapy (Signed)
 OUTPATIENT PHYSICAL THERAPY TREATMENT Patient Name: [name]. MRN: [id number] DOB:1950/03/29, 73 y.o., male Today's Date: 03/12/2024 PCP: Dennise Remak, MD  REFERRING PROVIDER:  Pegge Toribio PARAS, PA-C  END OF SESSION:   PT End of Session - 03/12/24 1335     Visit Number 57    Number of Visits 64    Date for Recertification  03/24/24    Authorization Type [employer]; 3 PT visits approved from 12/16.    Authorization Time Period [facility] auth#; [id number]    Authorization - Visit Number 2    Authorization - Number of Visits 3    Progress Note Due on Visit 60    PT Start Time 1315    PT Stop Time 1355    PT Time Calculation (min) 40 min    Equipment Utilized During Treatment Gait belt    Activity Tolerance Patient tolerated treatment well;No increased pain    Behavior During Therapy WFL for tasks assessed/performed          Past Medical History:  Diagnosis Date   Anemia    Cervical myelopathy (HCC)    Chronic venous insufficiency    [name] (chronic kidney disease), stage III (HCC)    Diabetes mellitus without complication (HCC)    GERD (gastroesophageal reflux disease)    Hypercholesteremia    Hypothyroidism    Kidney stones    about 50 in the past--last in March 4/24   Leg weakness, bilateral    due to Myasthenia Gravis   Myasthenia gravis (HCC)    Spinal stenosis    Vitamin B 12 deficiency    Vitamin B 12 deficiency    Wears hearing aid    bilateral   Past Surgical History:  Procedure Laterality Date   BACK SURGERY  09/2015   CARDIAC CATHETERIZATION     CATARACT EXTRACTION W/ INTRAOCULAR LENS IMPLANT     CATARACT EXTRACTION W/PHACO Right 12/21/2015   Procedure: CATARACT EXTRACTION PHACO AND INTRAOCULAR LENS PLACEMENT (IOC);  Surgeon: Dene Etienne, MD;  Location: [facility];  Service: Ophthalmology;  Laterality: Right;  DIABETIC - insulin  and oral meds   COLONOSCOPY     SHOULDER SURGERY     labrum repair   TONSILLECTOMY     Patient Active  Problem List   Diagnosis Date Noted   Acquired left foot drop 01/10/2024   Left spastic hemiplegia (HCC) 01/10/2024   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) 10/11/2023   High blood pressure 08/13/2023   Anemia 08/13/2023   Constipation 08/13/2023   Myasthenia gravis (HCC) 08/13/2023   Acute ischemic right MCA stroke (HCC) 07/19/2023   Lymphedema 12/27/2016   Leg pain 07/15/2016   Chronic venous insufficiency 07/15/2016   Swelling of limb 07/15/2016   Type 2 diabetes mellitus (HCC) 07/15/2016   Hyperlipidemia 07/15/2016    ONSET DATE: 07/19/23  REFERRING DIAG:  P36.488 (ICD-10-CM) - Cerebral infarction due to unspecified occlusion or stenosis of right middle cerebral artery  THERAPY DIAG:  Muscle weakness (generalized)  Other lack of coordination  Hemiplegia and hemiparesis following cerebral infarction affecting left non-dominant side (HCC)  Difficulty in walking, not elsewhere classified  Abnormality of gait and mobility  Unsteadiness on feet  Rationale for Evaluation and Treatment: Rehabilitation  SUBJECTIVE:  SUBJECTIVE STATEMENT:  Pt has 1 more visit after today- achieving a means of daily modified indpendent household walking in time for discharge is utmost importance to him.   PERTINENT HISTORY:  CVA with Left hemiplegia in April 2025. Pt started on a swedish knee cage at CIR due to uncontrolled hyperextension in stance. Pt had not been moving much since discharging from CIR, as therapists instructed pt to perform transfers only at home due to high fall risk with ambulation. Continues to have significant weakenss in the LUE with difficulty extending fingers as well as numbness and inattention to the L side of body resulting in multiple cuts on  Arm and leg with WC mobility in home. Pt trying to obtain power WC, but now has been limited by insurance approval. Pt started using a carbon fiber AFO in October, no difficult thus far, reports benefitial to energy conservation.  PAIN:  Are you having pain? No  PRECAUTIONS: Fall, history of wounds on LLE   *Latex allergy   WEIGHT BEARING RESTRICTIONS: No  FALLS: Has patient fallen in last 6 months? Yes. Number of falls 1  LIVING ENVIRONMENT: Lives with: lives with their spouse Lives in: House/apartment Stairs: Ramps installed while in the Hospital  Has following equipment at home: Vannie - 2 wheeled and Wheelchair (power)  PLOF: Independent with basic ADLs, Independent with household mobility without device, and Independent with community mobility with device with [facility]   PATIENT GOALS: relearn to Walk 15-75ft.   OBJECTIVE:                                                                                                                              TREATMENT DATE: 03/12/2024  -STS transfer to BRW (minA of Left hand on walker splint), minGuardA without LOB -overground AMB c BRW x152ft c 1 180-degree turnabout: 60m34s -seated rest x4 minutes, ready after 2, but answering caregiver questions about home walking safety  -STS transfer to BRW (minA of Left hand on walker splint), minGuardA without LOB -overground AMB c BRW x181ft c 1 180-degree turnabout: 55m53s -seated rest x4 minutes, ready after 2, but answering caregiver questions about WC approval and use  -STS transfer to BRW (minA of Left hand on walker splint), modA stabilization due to failed first attempt, failure to fully rise.  -overground AMB c BRW x128ft c 1 180-degree turnabout:  *paused at 41ft for someone to assist with [facility] that came undone while walking    PATIENT EDUCATION: Education details: knee cage needs assessmnet as it is causeing crossover gait.  Person educated: Patient and Spouse Education method: Explanation,  VC/TC/Demo Education comprehension: verbalized understanding  HOME EXERCISE PROGRAM: Access Code: [email or web address] URL: [email or web address] Date: 01/14/2024 Prepared by: Connell Kiss  Exercises - Seated Knee Extension AROM  - 1 x daily - 4 x weekly - 3 sets - 10 reps - Seated Hip Abduction  - 1 x daily - 4 x weekly - 3 sets - 10  reps - Seated March  - 1 x daily - 4 x weekly - 3 sets - 10 reps - Sit to Stand with Counter Support  - 1 x daily - 4 x weekly - 3 sets - 10 reps - Supine Short Arc Quad  - 1 x daily - 4 x weekly - 3 sets - 10 reps - Supine Hip Abduction  - 1 x daily - 4 x weekly - 3 sets - 10 reps - Supine Bridge  - 1 x daily - 4 x weekly - 3 sets - 3 reps - 2sec  hold - Seated Hamstring Stretch with Strap  - 2-3 x daily - 7 x weekly - 2 sets - 30 second to 1 minute hold - Seated Knee Flexion Slide  - 2-3 x daily - 7 x weekly - 2 sets - 30 seconds to 1 minute hold  12/17/2023: verbal instruction to complete repeated sit<>stands with education on safe set-up provided  GOALS: Goals reviewed with patient? Yes  SHORT TERM GOALS: Target date: 02/11/2024  Patient will be independent in home exercise program to improve strength/mobility for better functional independence with ADLs. Baseline: Initiated on 09/11/2023 10/22/2023: will update when appropriate 11/26/2023: patient participating in transfer and gait practice in controlled environment with 2 person assistance at home in addition to HEP 12/31/2023: patient participating in gait using bari-RW at home with family assistance Goal status: IN PROGRESS  LONG TERM GOALS: Target date: 03/24/2024  Patient will increase SIS-16 score to equal to or greater than 10points to demonstrate statistically significant improvement in mobility and quality of life.  Baseline: 49/80 09/19/2023: 45/80 10/22/2023: 46/80 11/26/2023: 47/80 12/31/2023: 53/80 Goal status: IN PROGRESS  2.  Patient (> 47 years old) will complete five times sit to  stand test in < 15 seconds indicating an increased LE strength and improved balance. Baseline: 1:10min 09/19/2023: 56.39 seconds using R UE support to push-up from armrest of green chair and requiring skilled min A for balance and lifting/lowering  10/22/2023: 27.10 seconds using R UE support to push-up from armrest  8/12: 27.7 sec with RUE pushing from arm rest.  11/26/2023: 37.42 seconds using R UE support to push-up from armrest of green chair and not using L UE support with min-modA for facilitating anterior weight shift and lifting to come to stand secondary to fatigue (would benefit from re-testing at next session) 12/17/2023: 24.59 seconds using R UE support to push-up from armrest of green chair and not using L UE support with CGA/light min A for safety/steadying  Goal status: IN PROGRESS  3.  Patient will increase FIST score by > 6 points to demonstrate decreased fall risk during functional activities Baseline:  43/56  09/23/2023: 53/56 Goal status: MET and UPGRADED  09/23/2023: Patient will increase Berg Balance score to > 45/56 to demonstrate improved balance and decreased fall risk during functional activities and ADLs.  Baseline: 09/23/2023: 14/56 7/31: 20/56 using RW support 11/26/2023: 17/56 without AD support 12/31/2023: 21/56 without AD support Goal status: IN PROGRESS  4.  Patient will increase 10 meter walk test to >0.67m/s as to improve gait speed for better community ambulation and to reduce fall risk. Baseline: 09/09/2023: 0.103 m/s ( and 37 seconds) using bari-RW with skilled heavy min A and +2 w/c follow for safety  09/19/2023: 0.46m/s using bari-RW with skilled min A of 1 and +2 w/c follow for safety 10/22/2023: 0.1875 m/s using bari-RW with L hand splint, L swedish knee cage, and only skilled light min  A of 1 (no wheelchair follow)  8/12: 0.126m/s with RW, and L hand splint. No knee cage on this day.  11/26/2023: Average Normal speed: 0.20 m/s & Average Fast speed: 0.275 m/s  using bari-RW with CGA/min A for balance wearing [facility] 12/17/2023: Average Normal speed: 0.32 m/s using bari-RW with CGA & Average Fast speed: 0.36 m/s using bari-RW requiring CGA/light min A  12/31/2023: Average Normal speed: 0.3275 m/s using bari-RW with CGA/light min A for steadying Goal status: IN PROGRESS  5.  Patient will reduce timed up and go to <11 seconds to reduce fall risk and demonstrate improved transfer/gait ability. Baseline: unable to complete turn at 10 ft. Mod-max assist due to poor control of LW on RW, resulting in L lateral LOB  09/09/2023: and 32 seconds using bari-RW with L hand splint and +2 on R side for safety, therapist providing skilled min A on L side (Had pt set-up L hand on RW splint, get L foot in proper positioning, and scoot forward in seat before starting the timer) 09/19/2023: 67min43 seconds using bari-RW with only skilled min A of 1 (Had pt set-up L hand on RW splint, get L foot in proper positioning, and scoot forward in seat before starting the timer) 7/2: 78sec(1:18.125) with RW and min assist overall.  10/22/2023: 1 min, 30 seconds using bari-RW with L hand splint and skilled light min assist  8/12: 1:01 min with RW and L hand splint.  11/26/2023: 51.57 seconds using bari-RW with hand splint, wearing SKC, and CGA-minA for balance 12/31/2023: 43.88 seconds using bari-RW with hand splint, wearing SKC, and CGA/occasional light min A   Goal status: IN PROGRESS   ASSESSMENT:  CLINICAL IMPRESSION:  Pt completed visit 2 of final 3 visits, is anxious to find a way to safely transition to modI AMB in the home. Pt reports he has been able to successful navigate 137ft AMB with BRW at home for exercise on his entry ramp. Pt able to AMB today with BRW overground 3 bouts, is limited by fatigue in time, but overall strikingly consistent in motor pattern, pacing- excellent self awareness of limitations and when recovery breaks are warranted to prevent adverse moments. 300  total feet AMB today over 3 bouts. Pt requires only minGuardA for most of mobility today, modA toward end during a fault STS transfer attempt. Biggest safety limitation going forward for home AMB is variable independence in managing his LUE on splint on walker.  Pt will continue to benefit from therapy services to improve gait mechanics and endurance levels with ambulation as well.    OBJECTIVE IMPAIRMENTS: Abnormal gait, cardiopulmonary status limiting activity, decreased activity tolerance, decreased balance, decreased cognition, decreased coordination, decreased endurance, decreased knowledge of condition, decreased knowledge of use of DME, decreased mobility, difficulty walking, decreased ROM, decreased strength, decreased safety awareness, dizziness, hypomobility, increased fascial restrictions, impaired perceived functional ability, increased muscle spasms, impaired sensation, impaired tone, impaired UE functional use, impaired vision/preception, improper body mechanics, postural dysfunction, and obesity.   ACTIVITY LIMITATIONS: carrying, lifting, bending, sitting, standing, squatting, stairs, transfers, bed mobility, bathing, toileting, dressing, reach over head, hygiene/grooming, locomotion level, and caring for others  PARTICIPATION LIMITATIONS: meal prep, cleaning, laundry, medication management, interpersonal relationship, driving, shopping, community activity, and yard work  PERSONAL FACTORS: Age, Past/current experiences, Time since onset of injury/illness/exacerbation, and 3+ comorbidities: MG, HTN, CVA, lymphedema are also affecting patient's functional outcome.   REHAB POTENTIAL: Good  CLINICAL DECISION MAKING: Unstable/unpredictable  EVALUATION COMPLEXITY: High  PLAN:  PT FREQUENCY: 1-2x/week  PT DURATION: 8 weeks  PLANNED INTERVENTIONS: 97164- PT Re-evaluation, 97750- Physical Performance Testing, 97110-Therapeutic exercises, 97530- Therapeutic activity, [id number]- Neuromuscular  re-education, 97535- Self Care, [id number]- Manual therapy, [id number]- Gait training, [phone]- Orthotic Initial, [phone]- Orthotic/Prosthetic subsequent, [phone]- Canalith repositioning, [id number]- Electrical stimulation (unattended), [phone]- Electrical stimulation (manual), [id number]- Wound care (first 20 sq cm), 97598- Wound care (each additional 20 sq cm), Patient/Family education, Balance training, Stair training, Taping, Dry Needling, Joint mobilization, Joint manipulation, Vestibular training, Visual/preceptual remediation/compensation, Cognitive remediation, DME instructions, Wheelchair mobility training, Cryotherapy, and Moist heat  PLAN FOR NEXT SESSION:   Final session, complete remaining prep for home moblity.   3:44 PM, 03/12/2024 Peggye JAYSON Linear, PT, DPT Physical Therapist - [facility]  Outpatient Physical Therapy- Main Campus [phone]

## 2024-03-17 ENCOUNTER — Ambulatory Visit

## 2024-03-17 ENCOUNTER — Encounter: Admitting: Physician Assistant

## 2024-03-17 DIAGNOSIS — E11622 Type 2 diabetes mellitus with other skin ulcer: Secondary | ICD-10-CM | POA: Diagnosis not present

## 2024-03-23 NOTE — Therapy (Signed)
 " OUTPATIENT PHYSICAL THERAPY TREATMENT/DISCHARGE Patient Name: [name]. MRN: [id number] DOB:October 14, 1950, 73 y.o., male Today's Date: 03/23/2024 PCP: Dennise Remak, MD  REFERRING PROVIDER:  Pegge Toribio PARAS, PA-C  END OF SESSION:     Past Medical History:  Diagnosis Date   Anemia    Cervical myelopathy (HCC)    Chronic venous insufficiency    [name] (chronic kidney disease), stage III (HCC)    Diabetes mellitus without complication (HCC)    GERD (gastroesophageal reflux disease)    Hypercholesteremia    Hypothyroidism    Kidney stones    about 50 in the past--last in March 4/24   Leg weakness, bilateral    due to Myasthenia Gravis   Myasthenia gravis (HCC)    Spinal stenosis    Vitamin B 12 deficiency    Vitamin B 12 deficiency    Wears hearing aid    bilateral   Past Surgical History:  Procedure Laterality Date   BACK SURGERY  09/2015   CARDIAC CATHETERIZATION     CATARACT EXTRACTION W/ INTRAOCULAR LENS IMPLANT     CATARACT EXTRACTION W/PHACO Right 12/21/2015   Procedure: CATARACT EXTRACTION PHACO AND INTRAOCULAR LENS PLACEMENT (IOC);  Surgeon: Dene Etienne, MD;  Location: [facility];  Service: Ophthalmology;  Laterality: Right;  DIABETIC - insulin  and oral meds   COLONOSCOPY     SHOULDER SURGERY     labrum repair   TONSILLECTOMY     Patient Active Problem List   Diagnosis Date Noted   Acquired left foot drop 01/10/2024   Left spastic hemiplegia (HCC) 01/10/2024   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  Rationale for Evaluation and Treatment: Rehabilitation  SUBJECTIVE:                                                                                                                                                                                             SUBJECTIVE STATEMENT:  Patient is aware today is discharge day due to insurance limitations   PERTINENT HISTORY:  CVA with Left hemiplegia in April 2025. Pt started on a swedish knee cage at CIR due to uncontrolled hyperextension in stance. Pt had not been moving much since discharging from [facility], as therapists instructed pt to perform transfers only at home due to high fall risk with ambulation. Continues to have significant weakenss in the LUE with difficulty extending fingers as well as numbness and inattention to the L side of body resulting in multiple cuts on Arm and leg with WC mobility in home. Pt trying to obtain power WC, but now has been limited by insurance approval. Pt started using a carbon fiber AFO in October, no difficult thus far, reports benefitial to energy conservation.  PAIN:  Are you having pain? No  PRECAUTIONS: Fall, history of wounds on LLE   *Latex allergy   WEIGHT BEARING RESTRICTIONS: No  FALLS: Has patient fallen in last 6 months? Yes. Number of falls 1  LIVING ENVIRONMENT: Lives with: lives with their spouse Lives in: House/apartment Stairs: Ramps installed while in the Hospital  Has following equipment at home: Vannie - 2 wheeled and Wheelchair (power)  PLOF: Independent with basic ADLs, Independent with household mobility without device, and Independent with community mobility with device with [facility]   PATIENT GOALS: relearn to Walk 15-79ft.   OBJECTIVE:                                                                                                                               TREATMENT DATE: 03/23/2024  Physical therapy treatment session today consisted of completing assessment of goals and administration of testing as demonstrated and documented in flow sheet, treatment, and goals section of this note. Addition treatments may be found below.   -STS transfer to BRW (minA of Left hand on walker splint), minGuardA without LOB -overground AMB c BRW x150ft c 1 180-degree turnabout: 77m34s -seated rest x4 minutes, ready after 2, but answering caregiver questions about home walking safety  -STS transfer to BRW (minA of Left hand on walker splint), minGuardA without LOB -overground AMB c BRW x117ft c 1 180-degree turnabout: 86m53s -seated rest x4 minutes, ready after 2, but answering caregiver questions about WC approval and use  -STS transfer to BRW (minA of Left hand on walker splint), modA stabilization due to failed first attempt, failure to fully rise.  -overground AMB c BRW x145ft c 1 180-degree turnabout:  *paused at 31ft for someone to assist with [facility] that came undone while walking    PATIENT EDUCATION: Education details: knee cage needs assessmnet as it is causeing crossover gait.  Person educated: Patient and Spouse Education method: Explanation, VC/TC/Demo Education comprehension: verbalized understanding  HOME EXERCISE PROGRAM: Access Code: [email or web address] URL: [email or web address] Date: 01/14/2024 Prepared by: Connell Kiss  Exercises - Seated Knee Extension AROM  - 1 x daily - 4 x weekly - 3 sets - 10 reps - Seated Hip Abduction  - 1 x daily - 4 x weekly - 3  sets - 10 reps - Seated March  - 1 x daily - 4 x weekly - 3 sets - 10 reps - Sit to Stand with Counter Support  - 1 x daily - 4 x weekly - 3 sets - 10 reps - Supine Short Arc Quad  - 1 x daily - 4 x weekly - 3 sets - 10 reps - Supine Hip Abduction  - 1 x daily - 4 x weekly - 3 sets - 10 reps - Supine Bridge  - 1 x daily - 4 x weekly - 3  sets - 3 reps - 2sec  hold - Seated Hamstring Stretch with Strap  - 2-3 x daily - 7 x weekly - 2 sets - 30 second to 1 minute hold - Seated Knee Flexion Slide  - 2-3 x daily - 7 x weekly - 2 sets - 30 seconds to 1 minute hold  12/17/2023: verbal instruction to complete repeated sit<>stands with education on safe set-up provided  GOALS: Goals reviewed with patient? Yes  SHORT TERM GOALS: Target date: 02/11/2024  Patient will be independent in home exercise program to improve strength/mobility for better functional independence with ADLs. Baseline: Initiated on 09/11/2023 10/22/2023: will update when appropriate 11/26/2023: patient participating in transfer and gait practice in controlled environment with 2 person assistance at home in addition to HEP 12/31/2023: patient participating in gait using bari-RW at home with family assistance Goal status: IN PROGRESS  LONG TERM GOALS: Target date: 03/24/2024  Patient will increase SIS-16 score to equal to or greater than 10points to demonstrate statistically significant improvement in mobility and quality of life.  Baseline: 49/80 09/19/2023: 45/80 10/22/2023: 46/80 11/26/2023: 47/80 12/31/2023: 53/80 Goal status: IN PROGRESS  2.  Patient (> 60 years old) will complete five times sit to stand test in < 15 seconds indicating an increased LE strength and improved balance. Baseline: 1:57min 09/19/2023: 56.39 seconds using R UE support to push-up from armrest of green chair and requiring skilled min A for balance and lifting/lowering  10/22/2023: 27.10 seconds using R UE support to push-up from armrest  8/12: 27.7 sec with RUE pushing from arm rest.  11/26/2023: 37.42 seconds using R UE support to push-up from armrest of green chair and not using L UE support with min-modA for facilitating anterior weight shift and lifting to come to stand secondary to fatigue (would benefit from re-testing at next session) 12/17/2023: 24.59 seconds using R UE support to  push-up from armrest of green chair and not using L UE support with CGA/light min A for safety/steadying  Goal status: IN PROGRESS  3.  Patient will increase FIST score by > 6 points to demonstrate decreased fall risk during functional activities Baseline:  43/56  09/23/2023: 53/56 Goal status: MET and UPGRADED  09/23/2023: Patient will increase Berg Balance score to > 45/56 to demonstrate improved balance and decreased fall risk during functional activities and ADLs.  Baseline: 09/23/2023: 14/56 7/31: 20/56 using RW support 11/26/2023: 17/56 without AD support 12/31/2023: 21/56 without AD support Goal status: IN PROGRESS  4.  Patient will increase 10 meter walk test to >0.70m/s as to improve gait speed for better community ambulation and to reduce fall risk. Baseline: 09/09/2023: 0.103 m/s ( and 37 seconds) using bari-RW with skilled heavy min A and +2 w/c follow for safety  09/19/2023: 0.43m/s using bari-RW with skilled min A of 1 and +2 w/c follow for safety 10/22/2023: 0.1875 m/s using bari-RW with L hand splint, L swedish knee cage, and  only skilled light min A of 1 (no wheelchair follow)  8/12: 0.176m/s with RW, and L hand splint. No knee cage on this day.  11/26/2023: Average Normal speed: 0.20 m/s & Average Fast speed: 0.275 m/s using bari-RW with CGA/min A for balance wearing [facility] 12/17/2023: Average Normal speed: 0.32 m/s using bari-RW with CGA & Average Fast speed: 0.36 m/s using bari-RW requiring CGA/light min A  12/31/2023: Average Normal speed: 0.3275 m/s using bari-RW with CGA/light min A for steadying Goal status: IN PROGRESS  5.  Patient will reduce timed up and go to <11 seconds to reduce fall risk and demonstrate improved transfer/gait ability. Baseline: unable to complete turn at 10 ft. Mod-max assist due to poor control of LW on RW, resulting in L lateral LOB  09/09/2023: and 32 seconds using bari-RW with L hand splint and +2 on R side for safety, therapist providing skilled  min A on L side (Had pt set-up L hand on RW splint, get L foot in proper positioning, and scoot forward in seat before starting the timer) 09/19/2023: 30min43 seconds using bari-RW with only skilled min A of 1 (Had pt set-up L hand on RW splint, get L foot in proper positioning, and scoot forward in seat before starting the timer) 7/2: 78sec(1:18.125) with RW and min assist overall.  10/22/2023: 1 min, 30 seconds using bari-RW with L hand splint and skilled light min assist  8/12: 1:01 min with RW and L hand splint.  11/26/2023: 51.57 seconds using bari-RW with hand splint, wearing SKC, and CGA-minA for balance 12/31/2023: 43.88 seconds using bari-RW with hand splint, wearing SKC, and CGA/occasional light min A   Goal status: IN PROGRESS   ASSESSMENT:  CLINICAL IMPRESSION:  *** discharge    OBJECTIVE IMPAIRMENTS: Abnormal gait, cardiopulmonary status limiting activity, decreased activity tolerance, decreased balance, decreased cognition, decreased coordination, decreased endurance, decreased knowledge of condition, decreased knowledge of use of DME, decreased mobility, difficulty walking, decreased ROM, decreased strength, decreased safety awareness, dizziness, hypomobility, increased fascial restrictions, impaired perceived functional ability, increased muscle spasms, impaired sensation, impaired tone, impaired UE functional use, impaired vision/preception, improper body mechanics, postural dysfunction, and obesity.   ACTIVITY LIMITATIONS: carrying, lifting, bending, sitting, standing, squatting, stairs, transfers, bed mobility, bathing, toileting, dressing, reach over head, hygiene/grooming, locomotion level, and caring for others  PARTICIPATION LIMITATIONS: meal prep, cleaning, laundry, medication management, interpersonal relationship, driving, shopping, community activity, and yard work  PERSONAL FACTORS: Age, Past/current experiences, Time since onset of injury/illness/exacerbation, and 3+  comorbidities: MG, HTN, CVA, lymphedema are also affecting patient's functional outcome.   REHAB POTENTIAL: Good  CLINICAL DECISION MAKING: Unstable/unpredictable  EVALUATION COMPLEXITY: High  PLAN:  PT FREQUENCY: 1-2x/week  PT DURATION: 8 weeks  PLANNED INTERVENTIONS: 97164- PT Re-evaluation, 97750- Physical Performance Testing, 97110-Therapeutic exercises, 97530- Therapeutic activity, [id number]- Neuromuscular re-education, 97535- Self Care, [id number]- Manual therapy, [id number]- Gait training, [id number]- Orthotic Initial, [id number]- Orthotic/Prosthetic subsequent, [phone]- Canalith repositioning, H9716- Electrical stimulation (unattended), [phone]- Electrical stimulation (manual), [id number]- Wound care (first 20 sq cm), 97598- Wound care (each additional 20 sq cm), Patient/Family education, Balance training, Stair training, Taping, Dry Needling, Joint mobilization, Joint manipulation, Vestibular training, Visual/preceptual remediation/compensation, Cognitive remediation, DME instructions, Wheelchair mobility training, Cryotherapy, and Moist heat  PLAN FOR NEXT SESSION:   Final session, complete remaining prep for home moblity.   9:53 AM, 03/23/2024 Donta Fuster  Leopoldo, PT, DPT Physical Therapist - [facility]  Outpatient Physical Therapy- [facility] [phone]        "

## 2024-03-24 ENCOUNTER — Ambulatory Visit

## 2024-03-24 ENCOUNTER — Ambulatory Visit: Admitting: Occupational Therapy

## 2024-03-24 DIAGNOSIS — I69354 Hemiplegia and hemiparesis following cerebral infarction affecting left non-dominant side: Secondary | ICD-10-CM

## 2024-03-24 DIAGNOSIS — R269 Unspecified abnormalities of gait and mobility: Secondary | ICD-10-CM

## 2024-03-24 DIAGNOSIS — M6281 Muscle weakness (generalized): Secondary | ICD-10-CM | POA: Diagnosis not present

## 2024-03-24 DIAGNOSIS — R262 Difficulty in walking, not elsewhere classified: Secondary | ICD-10-CM

## 2024-03-24 NOTE — Therapy (Addendum)
 " Outpatient Occupational Therapy Neuro Treatment/Recertication/Insurance Authorization Note  Patient Name: [name]. MRN: [id number] DOB:06-Feb-1951, 73 y.o., male Today's Date: 03/24/2024  PCP: Alm Rower, MD REFERRING PROVIDER: Hermann Toribio PARAS, PA-C  END OF SESSION:  OT End of Session - 03/24/24 1843     Visit Number 59    Number of Visits 72    Date for Recertification  06/16/2024   OT Start Time 1400    OT Stop Time 1445    OT Time Calculation (min) 45 min    Activity Tolerance Patient tolerated treatment well    Behavior During Therapy [facility] for tasks assessed/performed          Past Medical History:  Diagnosis Date   Anemia    Cervical myelopathy (HCC)    Chronic venous insufficiency    [name] (chronic kidney disease), stage III (HCC)    Diabetes mellitus without complication (HCC)    GERD (gastroesophageal reflux disease)    Hypercholesteremia    Hypothyroidism    Kidney stones    about 50 in the past--last in March 4/24   Leg weakness, bilateral    due to Myasthenia Gravis   Myasthenia gravis (HCC)    Spinal stenosis    Vitamin B 12 deficiency    Vitamin B 12 deficiency    Wears hearing aid    bilateral   Past Surgical History:  Procedure Laterality Date   BACK SURGERY  09/2015   CARDIAC CATHETERIZATION     CATARACT EXTRACTION W/ INTRAOCULAR LENS IMPLANT     CATARACT EXTRACTION W/PHACO Right 12/21/2015   Procedure: CATARACT EXTRACTION PHACO AND INTRAOCULAR LENS PLACEMENT (IOC);  Surgeon: Dene Etienne, MD;  Location: [facility];  Service: Ophthalmology;  Laterality: Right;  DIABETIC - insulin  and oral meds   COLONOSCOPY     SHOULDER SURGERY     labrum repair   TONSILLECTOMY     Patient Active Problem List   Diagnosis Date Noted   Acquired left foot drop 01/10/2024   Left spastic hemiplegia (HCC) 01/10/2024   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) 10/11/2023   High blood pressure  08/13/2023   Anemia 08/13/2023   Constipation 08/13/2023   Myasthenia gravis (HCC) 08/13/2023   Acute ischemic right MCA stroke (HCC) 07/19/2023   Lymphedema 12/27/2016   Leg pain 07/15/2016   Chronic venous insufficiency 07/15/2016   Swelling of limb 07/15/2016   Type 2 diabetes mellitus (HCC) 07/15/2016   Hyperlipidemia 07/15/2016   ONSET DATE: 07/13/2023  REFERRING DIAG: Acute ischemic R MCA CVA  THERAPY DIAG: muscle weakness (generalized), other lack of coordination, vision disturbance, hemiplegia and hemiparesis following cerebral infarction affecting left non-dominant side (HCC)  Rationale for Evaluation and Treatment: Rehabilitation  SUBJECTIVE:  SUBJECTIVE STATEMENT: Pt. Reports having had a nice holiday. Pt accompanied by: [name]  PERTINENT HISTORY: Pt. Was admitted to [facility] on 07/13/2023 after sustaining a CVA while on a trip to the beach. Pt. Was diagnosed with R MCA CVA. Pt. Was admitted to inpatient rehab from 07/19/2023-08/13/2023. Past medical history includes high BP, anemia, and Myasthenia Gravis.   PRECAUTIONS: None  WEIGHT BEARING RESTRICTIONS: No  PAIN: 03/10/24: No pain  FALLS: Has patient fallen in last 6 months? Yes  LIVING ENVIRONMENT: Lives with: lives with their family and lives with their spouse-  Lives in: House/apartment- 2 story home and resides mostly on the 1st floor Stairs: No- Ramped entrance Has following equipment at home:  Vannie, cane,  Steadi, shower chair, handheld shower head, bedside commode  PLOF: Independent and Independent with basic ADLs Independent at home   PATIENT GOALS: Would like to walk 10-20 feet each time. 01/16/24: Update: pt has restarted PT visits and will be continuing to focus on mobility in those sessions.  Pt continues to work towards improving functional use of the LUE during OT visits.   OBJECTIVE:  Note: Objective measures were completed at Evaluation unless otherwise noted.  HAND DOMINANCE:  Right  ADLs: Overall ADLs:  Transfers/ambulation related to ADLs: Eating: Independent, 100% R handed Grooming: independent UB Dressing: Can put on shirt independently LB Dressing: Difficulty with pants and requires assistance getting the pants on his feet, has difficulty putting on shoes and socks Toileting: Tot A for toilet hygiene. Uses Steadi during toilet transfers. Bathing: Requires assist with using the L arm to wash the R arm. Tub Shower transfers: Roll in shower, built in shower chair, grab bars and hand held shower head.  IADLs: Shopping: Total A (Wife typically did the shopping prior to onset)   Light housekeeping: Total A (wife typically performs prior to onset) Meal Prep: Independent with light snack prep Community mobility: No driving, able to get in/out of the car Medication management: Set up assistance from his wife using a pill box (pt. Is responsible for taking medications at the correct time) Financial management: family members check behind him. Handwriting: TBD Hobbies: gardening, wood working and sports- [employer] Work history: Owns a tax business  MOBILITY STATUS: Needs Assist:    POSTURE COMMENTS:  Sitting balance: Good supported sitting balance  FUNCTIONAL OUTCOME MEASURES:  Fugl-Meyer: see below  UPPER EXTREMITY ROM:    Active ROM Right Eval  [facility] Right 09/23/2023 [facility] Left eval Left 09/23/2023 Left 10/29/23 Left 12/02/23 Left 12/31/23 Left 02/11/24 Right 03/24/24  Shoulder flexion   89(110) 109(114) 111(115) 90 before scaption (110) 95 before scaption (110) 100 before scaption (110) 110 before scaption (113)  Shoulder abduction   98(120) 109(120) 111(122) 100 (110) 105(118) 107(121) 114(121)  Shoulder adduction           Shoulder extension           Shoulder internal rotation           Shoulder external rotation           Elbow flexion   120(140) 130(136) 141(145)  146 146 146  Elbow extension   -28(-10) -5(0) -18(0)  -3(0) 0 0  Wrist flexion    60(60) Rest at 60 50(65) 54(60)    55(64)  Wrist extension   -48(42) 10(40) 20(50) 30 (45) 30(45) 45(54) 45(54)  Wrist ulnar deviation     12(22)      Wrist radial deviation     10(20)      Wrist pronation     (82)90   85 85  Wrist supination     64(74) 70 (75)  34(74) 45(74)  (Blank rows = not tested)  Eval: L Digit flexion: 2nd:3cm (0cm) 3rd: 0cm (0cm), 4th: 0cm (0cm), 5th: 2cm (0cm)  09/23/23:  L Digit flexion to [facility]: 2nd: 3.5cm (0cm), 3rd: 4 cm(0cm), 4th 3.5cm (0cm), 5th: 2cm (0cm)  Eval:  L Digit extension: Active Gross digit extension through 10% of ROM  09/23/23:  L Digit extension: Active Gross digit extension through 50% of ROM  10/29/23:  L Digit extension: Active Gross digit extension through 25-50% of ROM  12/02/23:  L digit extension: Active gross ext through 30%   12/31/23:  L digit extension:  Active gross ext through ~30-40%   02/11/24:  Left full composite fist  L digit extension:  Active gross ext through 60% range  UPPER EXTREMITY MMT:     MMT Right eval Left eval Left 09/23/2023 Left 10/29/23 Left 12/02/23 Left 12/31/23 Left 02/11/24 Right 03/14/24  Shoulder flexion 5 3- 3-/5 3-/5  3-/5 3-/5 3-/5  Shoulder abduction 5 3- 3-/5 3-/5  3-5 3-/5 3-/5  Shoulder adduction          Shoulder extension          Shoulder internal rotation          Shoulder external rotation          Middle trapezius          Lower trapezius          Elbow flexion 5 TBD 3+/5 4-/5 4+/5 4+/5 4+/5 4+/5  Elbow extension 5 TBD 4-/5 3-/5 4+/5 4+/5 4+/5 4+/5  Wrist flexion   2/5 3-/5 3-     Wrist extension  2- 2/5 2+/5 3- 3-/5 3-/5 3-/5  Wrist ulnar deviation          Wrist radial deviation          Wrist pronation    3/5 3+  3+/5 3+/5  Wrist supination    3-/5 3-  3-/5 3-/5  (Blank rows = not tested)  HAND FUNCTION:  Eval:  Grip strength: Right: 94#, Left: 1#,  Lateral Key Pinch strength: Right: 14#, Left: 3#  10/29/23:  Grip strength: Left: 16# Lateral Key  Pinch strength: Left: 5#  12/02/23: L: 5 lbs, R: 98 # Lateral pinch: Left: 5 lbs   12/31/23:   Grip strength: Right: 105#, Left: 14#,   02/11/24:   Grip strength: Left: 22#  03/24/24:   Grip strength: Left: 18#  Lateral Pinch strength: Left 9#   COORDINATION: Box and Blocks: Right: 34 blocks completed in 1 min., Left: 0 blocks completed in 1 min.   10/29/23:  Box and Blocks: Right: 34 blocks completed in 1 min., Left: 2 blocks completed in 1 min.  9 Hole Peg Test: Left: To remove 9 pegs from a vertical position: 1 min. &13 sec.  12/02/23: Box and Blocks: 4 blocks 12/02/23: 4 pegs out in 3 min (multiple pulled and dropped out of the cup)   12/02/23: Box and Blocks Test: 5 blocks in 1 min.  02/10/24: Box and Blocks Test: 5 blocks in 1 min.  02/11/24: 1 min. & 18 sec. to remove 9 pegs.  Box and Blocks: 1 block in 1 min.   03/24/24: 47 sec. to remove 9 pegs.  Box and Blocks: 3 blocks in 1 min.    FUGL-MEYER ASSESSMENT   A. Upper Extremity   I. Reflex Activity:   12/12/23   Flexors (biceps) 2   Extensors (triceps) 2   Total 4/4                 II. Volitional Movement within Synergies:   12/12/23   Flexor Synergy:      Shoulder Elevation 2   Shoulder Retraction 2   Shoulder Abduction (at least 90*) 2   External Rotation 1   Elbow Flexion 2   Forearm Supination 1   Extensor Synergy:      Shoulder Adduction/Internal Rotation 2   Elbow Extension 1   Forearm Pronation 2   Total 15/18     III. Movement Combining Synergies:   12/12/23   Hand  to Lumbar Spine 1   Shoulder Flexion at 90*, elbow at 0* 0   Pron/Sup w/elbow at 90* and shoulder at 0* 1   Total 2/6     IV. Movement Out of Synergy   12/12/23   Shoulder Abduction to 90*, elbow at 0*, forearm pronated 0   Shoulder Flexion 9)-180*, elbow at 0*, forearm in mid position 0   Pron/sup, elbow at 0* and shoulder between 30-90* of flexion 0   Total 0/6     B. Wrist   12/12/23   Stability of wrist at 15* ext,  elbow at 90*, shoulder 0* 2   Repeated flex/ext, elbow at 90*, shoulder 0* 1   Stability of wrist at 15* ext, elbow at 0*, shoulder 30* 0   Flex/ext, elbow 0*, shoulder 30* 0   Circumduction  1   Total 4/10     C. Hand   12/12/23   Mass Flexion 1   Mass Extension 1   Total 2/4     D. Grasp   12/12/23   Hook  0   Thumb Adduction - paper 1   Pincer - pen 0   Cylindrical - cup/can 0   Spherical - tennis ball 1   Total 2/10     E. Coordination/Speed   12/12/23   Tremor 2   Dysmetria 0   Time 1   Total 3/6     FUGL-MEYER ASSESSMENT: UPPER EXTREMITY A-E TOTAL on (date): 32/66   SENSATION: Sensation feels different in both hands.  L hand Light touch- impaired L hand Proprioception- impaired  EDEMA: Has had hx of edema, and came to Eval session with swelling in the L hand/wrist/forearm.   *pt. has a laceration from his dog on the dorsal aspect of the L hand.   MUSCLE TONE: Increased tone through the UE and hand flexors.   COGNITION: Overall cognitive status: Within functional limits for tasks assessed  VISION: Subjective report: L sided inattention Baseline vision: Wears glasses for reading only Visual history: Right visual occlusion  VISION ASSESSMENT: To be assessed  PERCEPTION: TBD  PRAXIS: Impaired                                                                                                                 TREATMENT DATE: 03/24/2024  Measurements were obtained, and goals were reviewed with the Pt.  Education: Education details:  Pt./caregiver education about opportunities for weightbearing at home., as well as preparing the hand for assuming weighberaing. Person educated: Patient Education method: Explanation, Demonstration, Tactile cues, and Verbal cues Education comprehension: verbalized understanding, returned demonstration, verbal cues required, and tactile cues required, further training needed  HOME EXERCISE PROGRAM: -grasping and stacking 1,  and 1/2 cubes form the tabletop surface; yellow theraband for LUE strengthening   GOALS: Goals reviewed with patient? Yes  SHORT TERM GOALS: Target date:  05/05/2024     Pt. Will be independent with HEP for the LUE.  Baseline: 03/14/24: Independent 02/11/24: Independent 12/31/23: Independent; ongoing 12/03/23: indep with  current HEP, though ongoing progressions are being made as UE function improves; 10/29/23: Pt. requires assist from wife with HEPs. Pt. Is consistently working on HEP's at home. 09/19/23: Requires assist from his wife Eval; no current HEP Goal status: Achieved,Ongoing  LONG TERM GOALS: Target date: 06/16/2024  Pt. Will increase L shoulder flexion by 10 degrees to be able to reach up to shelves. Baseline: 03/12/24: 110 before scaption (113) 02/11/24: 100 before scaption (110) 12/31/23: Left: 95 degrees before scaption, 110 of scaption 12/03/23: 90* before onset of scaption; 10/29/23: 111(115) 09/19/23: Pt. Continues to be limited with reaching up to shelves. Eval: L shoulder flexion is 89 (110). Goal status: progressing, Ongoing  2.  Pt. Will increase L shoulder abduction by 10 degrees to be able to  reach up to shampoo hair.  Baseline: 12/230/25: 114(121) Pt. Is able to complete underarm hygiene.02/11/24: 892(878) 12/31/23: Left: 894(881) 12/03/23: 100*; pt able to demo bilat underarm hygiene with set up; 10/29/23: 888(877) 09/19/23: Pt. Continues to require assist with underarm hygiene.Eval: L shoulder abduction is 98 (120).  Goal status: Goal revised for shampooing hair (12/30- Achieved goal for underarm care )  3.  Pt. Will improve L wrist extension by 10 degrees to be able to initiate anticipation of grasping for objects.  Baseline:03/24/24: 45(54) 02/11/24: 45(54) 12/31/23: 30(45) 12/03/23: 30* (45); 10/29/23: 20(55) 09/19/23: Consistent activation noted in the left wrist extensors with facilitation. Eval: -48 (42) Goal status: Ongoing  4.  Pt. Will improve active gross digit extension  through 50% of the range to be able to release objects in his hand consistently.  Baseline:03/24/24: Active gross digit extension through  65% of the range 02/11/24: L digit extension: Active gross ext through 60% range 12/31/23: Gross digit extension: ~30-40% 12/03/23: Gross digit ext through ~30% of range (limited by flexor tone in the PIPs); 10/28/20: Gross digit extension through 25-50% of the range. 09/19/23: gross digit extension noted through 25% range with facilitation.Eval: gross digit extension is 10% of the range  Goal status: Ongoing  5.  Pt. Will independently demonstrate visual compensatory strategies when navigating through environment and during tabletop tasks. Baseline: 12/03/23: Pt demonstrates consistent head turns to scan to L visual field without cueing; 10/29/2023: Pt. Continues to less cuing for left sided awareness. 09/19/23: Pt. requires fewer cues for left sided awareness Eval: Pt. Requires consistent cuing for L sided awareness.  Goal status: achieved   6. Pt. Will improve left hand [facility] skills to be able to independently manipulate small objects during ADLs, and IADLs. Baseline: 03/24/24: 9 Hole Peg Test: 47 sec. to remove 9 pegs. 02/11/24: 9 Hole Peg Test:1 min. & 18 sec. to remove 9 pegs.12/31/23: Pt. Is progressing to be able to grasp progressively smaller objects. 12/03/23: Pt removed 3 pegs in >3 min, pulling out more than 3 but repeatedly dropping to floor or table top rather than assessment dish; 10/29/23: 9 Hole Peg Test: Pt. Was able to remove 9 pegs from vertical position on the pegboard in 1 min. & 13 sec. Pt. Is not yet able to pick up pegs from a horizontal position to place them in the pegboard.   Goal status: ongoing   7. Pt will improve left hand function skills as evidenced by improved score to 4 blocks on the Box and Blocks test. Baseline: 03/24/24: Box and Blocks: 3 blocks in 1 min.  02/11/24 Box and Blocks: 1 block in 1 min. 12/31/23: 4 blocks in 1 min. 12/03/23: 4  blocks after 3 trials  and passive wrist and digit stretching between trials (not yet consistent to achieve 4 blocks); 10/29/23: Box and Blocks Test: 2 Blocks completed in 1 min.   Goal Status: ongoing    8. Pt. Will independently be able to efficiently place his left hand on, and hold the walker handle.   Baseline: 03/24/24: Pt. Is unable to efficiently place his left hand on, and hold the walker handle.   Goal status: New  ASSESSMENT:  CLINICAL IMPRESSION:   Pt. has made steady progress over this recertification period. Pt. Has improved with left shoulder ROM, left pinch strength, left FMC, and left hand function skills as evident by the above measurements obtained. Pt. continues to improve with his ability to engage his LUE during daily ADL, and IADL tasks. Pt. has met the goal for being able to complete underarm care, with the goal now revised to reaching further to be able to shampoo his hair. A new goal has been added to the POC for him to be able to efficiently place, and hold the walker with the left hand for safe walker use. Although Pt. Has made progress, he continues to present with LUE weakness and incoordination which continues to impact performance with ADLs, and IADL tasks. Pt. has a surgical consult for Vivistim in January. Pt. continues to benefit from OT services to work on improving LUE functioning to increase engagement of the LUE during daily tasks, and to provide education about compensatory strategies during ADLs/IADLs.   PERFORMANCE DEFICITS: in functional skills including ADLs, IADLs, coordination, dexterity, proprioception, sensation, edema, tone, ROM, strength, pain, Fine motor control, Gross motor control, mobility, balance, endurance, vision, and UE functional use, cognitive skills including perception, and psychosocial skills including coping strategies, environmental adaptation, and routines and behaviors.   IMPAIRMENTS: are limiting patient from ADLs, IADLs, and leisure.    CO-MORBIDITIES: may have co-morbidities  that affects occupational performance. Patient will benefit from skilled OT to address above impairments and improve overall function.  MODIFICATION OR ASSISTANCE TO COMPLETE EVALUATION: Min-Moderate modification of tasks or assist with assess necessary to complete an evaluation.  OT OCCUPATIONAL PROFILE AND HISTORY: Detailed assessment: Review of records and additional review of physical, cognitive, psychosocial history related to current functional performance.  CLINICAL DECISION MAKING: Moderate - several treatment options, min-mod task modification necessary  REHAB POTENTIAL: Good  EVALUATION COMPLEXITY: Moderate  PLAN:  OT FREQUENCY: 2x/week  OT DURATION: 12 weeks  PLANNED INTERVENTIONS: 97168 OT Re-evaluation, 97535 self care/ADL training, 02889 therapeutic exercise, 97530 therapeutic activity, 97112 neuromuscular re-education, 97140 manual therapy, 97018 paraffin, 02989 moist heat, 97010 cryotherapy, 97034 contrast bath, 97760 Orthotic Initial, 97763 Orthotic/Prosthetic subsequent, passive range of motion, visual/perceptual remediation/compensation, energy conservation, and DME and/or AE instructions  RECOMMENDED OTHER SERVICES: PT and ST  CONSULTED AND AGREED WITH PLAN OF CARE: Patient and family member/caregiver  PLAN FOR NEXT SESSION: see above  Richardson Otter, MS, OTR/L   03/24/2024, 6:45 PM   "

## 2024-03-31 ENCOUNTER — Ambulatory Visit

## 2024-04-02 ENCOUNTER — Ambulatory Visit: Attending: Physician Assistant

## 2024-04-02 ENCOUNTER — Ambulatory Visit

## 2024-04-02 DIAGNOSIS — I69354 Hemiplegia and hemiparesis following cerebral infarction affecting left non-dominant side: Secondary | ICD-10-CM | POA: Diagnosis present

## 2024-04-02 DIAGNOSIS — R278 Other lack of coordination: Secondary | ICD-10-CM | POA: Insufficient documentation

## 2024-04-02 DIAGNOSIS — M6281 Muscle weakness (generalized): Secondary | ICD-10-CM | POA: Diagnosis present

## 2024-04-02 NOTE — Progress Notes (Signed)
 In general, would you say your health is:  fair  In general, would you say your quality of life is: poor  In general, how would you rate your physical health?  fair  In general, how would you rate your mental health, including your mood and your ability to think?  fair  In general, how would you rate your satisfaction with your social activities and relationships? poor  In general, please rate how well you carry out your usual social activities and roles. (This includes activities at home, at work and in paediatric nurse, and responsibilities as a parent, child, spouse, employee, friend, etc.)   fair  To what extent are you able to carry out your everyday physical activities such as walking, climbing stairs, carrying groceries, or moving a chair? Moderately  In the past 7 days. . .  How often have you been bothered by emotional problems such as feeling anxious, depressed or irritable?  Rarely  How would you rate your fatigue on average?  Mild  How would you rate your pain on average?  0

## 2024-04-02 NOTE — Progress Notes (Signed)
 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  He has made some improvements with occupational therapy and physical therapy but unfortunately has started to plateau and would like to see if he could get any additional improvement.  He was seen by his occupational therapist, fugal Gretel evaluation was performed and he was deemed a good candidate for him e-stim and so was sent to me for evaluation.  He does taxes for living, has a difficult time typing because his left hand does not function as it normally did.  He is able to close the hand and slightly open it but does not have the dexterity to type on a computer.  He takes aspirin  81 mg daily, is not on any additional blood thinners or anticoagulants.  The following portions of the patient's history were reviewed and updated as appropriate: allergies, current medications, past family history, past medical history, past social history, past surgical history and problem list.  Medical History[1]  Surgical History[2]  Social Connections: Not on file    Family History[3]    Allergies[4]  Review of Systems:   As per HPI  Home Medications           * aspirin  81 mg EC tablet   * atorvastatin  (LIPITOR ) 80 mg tablet   * cyanocobalamin (VITAMIN B12) 1,000 mcg/mL injection   * doxycycline (VIBRAMYCIN) 100 mg capsule   * FreeStyle Libre 3 Plus Sensor   * gentamicin (GARAMYCIN) 0.1 % cream   * insulin  degludec (TRESIBA  FlexTouch) 100 unit/mL (3 mL) pen   * levothyroxine  (SYNTHROID ) 150 mcg tablet   * losartan  (COZAAR ) 50 mg tablet   * metFORMIN  (GLUCOPHAGE ) 1,000 mg tablet   * mycophenolate  (CELLCEPT ) 250 mg capsule   *  mycophenolate  (CELLCEPT ) 500 mg tablet   * predniSONE  (DELTASONE ) 1 mg tablet   * sodium bicarbonate  650 mg tablet       Physical Exam:  Vitals:  Vitals:   04/02/24 0909  BP: 130/69  Pulse: 63  Temp: 97.8 F (36.6 C)  SpO2: 99%   Elderly male, sitting up in chair, no acute distress Awake, alert, oriented x 4 PERRLA, EOMI Face symmetric Full strength in the right hemibody Left upper extremity 4-/5 in the deltoid, 4+/5 in the bicep, tricep and handgrip, 1/5 finger extension Left lower extremity 5/5 except in dorsiflexion, uses an AFO    Electronically signed by: Rosine Lucas Jacklyn Mickey, MD 04/02/2024 9:57 AM. I have personally spent 60 minutes involved in face-to-face and non-face-to-face activities for this patient on the day of the visit. Professional time spent includes the following activities, in addition to those noted in the documentation: Reviewing outside records, reviewing prior office visit notes, reviewing vitals, reviewing prior imaging, physical examination, arranging treatment, counseling patient.    Assessment:  1. Monoplegia affecting left nondominant side    (CMD)  Case request operating room: INSERTION STIMULATOR VAGAL NERVE    2. Cerebrovascular accident (CVA) due to embolism of right middle cerebral artery    (CMD)  Case request operating room: INSERTION STIMULATOR VAGAL NERVE        Plan: -We had a long discussion about the bee sting, discussed  the risks and benefits of vagal nerve stimulation, discussed the expectation for occupational therapy postprocedure.  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  Given these things I think he would be a great candidate for vagal nerve stimulation with paired occupational therapy.  I will get him set up for this, will  obtain preoperative labs and an EKG.  All questions were answered, he signed informed consent in the office today.   No orders of the defined types were placed in this encounter.   Electronically signed by: Rosine Lucas Jacklyn Mickey, MD 04/02/2024 9:57 AM       [1] History reviewed. No pertinent past medical history. [2] History reviewed. No pertinent surgical history. [3] No family history on file. [4] Allergies Allergen Reactions   Azithromycin Other (See Comments)    Increases Myasthenia gravis symptoms   Ciprofloxacin Other (See Comments)    Increases Myasthenia Gravis symptoms   Latex Other (See Comments)    Skin irritant   Magnesium Other (See Comments)    Myasthenia Gravis patient   Metoprolol Other (See Comments)    Increases Myasthenia gravis symptoms   Adhesive Rash   Cefdinir Other (See Comments)    Caused c-diff  C. Diff   Penicillins Rash    In High doses  Large doses  *Some images could not be shown.

## 2024-04-05 NOTE — Therapy (Signed)
 " Outpatient Occupational Therapy Neuro Progress and Treatment Note Reporting period beginning 02/11/24-04/02/24  Patient Name: [name]. MRN: [id number] DOB:[birth year], 74 y.o., male Today's Date: 04/05/2024  PCP: Alm Rower, MD REFERRING PROVIDER: Hermann Toribio PARAS, PA-C  END OF SESSION:  OT End of Session - 04/05/24 1655     Visit Number 60    Number of Visits 72    Date for Recertification  06/17/23    Authorization Type 3 OT vst from 01/06-02/03/26    Authorization - Visit Number 1    Authorization - Number of Visits 3    Progress Note Due on Visit 70    OT Start Time 1350    OT Stop Time 1443    OT Time Calculation (min) 53 min    Activity Tolerance Patient tolerated treatment well    Behavior During Therapy WFL for tasks assessed/performed         Past Medical History:  Diagnosis Date   Anemia    Cervical myelopathy (HCC)    Chronic venous insufficiency    [name] (chronic kidney disease), stage III (HCC)    Diabetes mellitus without complication (HCC)    GERD (gastroesophageal reflux disease)    Hypercholesteremia    Hypothyroidism    Kidney stones    about 50 in the past--last in March 4/24   Leg weakness, bilateral    due to Myasthenia Gravis   Myasthenia gravis (HCC)    Spinal stenosis    Vitamin B 12 deficiency    Vitamin B 12 deficiency    Wears hearing aid    bilateral   Past Surgical History:  Procedure Laterality Date   BACK SURGERY  09/2015   CARDIAC CATHETERIZATION     CATARACT EXTRACTION W/ INTRAOCULAR LENS IMPLANT     CATARACT EXTRACTION W/PHACO Right 12/21/2015   Procedure: CATARACT EXTRACTION PHACO AND INTRAOCULAR LENS PLACEMENT (IOC);  Surgeon: Dene Etienne, MD;  Location: [facility];  Service: Ophthalmology;  Laterality: Right;  DIABETIC - insulin  and oral meds   COLONOSCOPY     SHOULDER SURGERY     labrum repair   TONSILLECTOMY     Patient Active Problem List   Diagnosis Date Noted   Acquired left foot  drop 01/10/2024   Left spastic hemiplegia (HCC) 01/10/2024   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) 10/11/2023   High blood pressure 08/13/2023   Anemia 08/13/2023   Constipation 08/13/2023   Myasthenia gravis (HCC) 08/13/2023   Acute ischemic right MCA stroke (HCC) 07/19/2023   Lymphedema 12/27/2016   Leg pain 07/15/2016   Chronic venous insufficiency 07/15/2016   Swelling of limb 07/15/2016   Type 2 diabetes mellitus (HCC) 07/15/2016   Hyperlipidemia 07/15/2016   ONSET DATE: 07/13/2023  REFERRING DIAG: Acute ischemic R MCA CVA  THERAPY DIAG: muscle weakness (generalized), other lack of coordination, vision disturbance, hemiplegia and hemiparesis following cerebral infarction affecting left non-dominant side (HCC)  Rationale for Evaluation and Treatment: Rehabilitation  SUBJECTIVE:  SUBJECTIVE STATEMENT: Pt reports having a positive visit with Dr. Jacklyn at [facility] today, who was able to complete pre-surgery screen for Vivistim implantation.  Surgery has been recommended by Dr. Jacklyn and is scheduled for beginning of Feb, though will have to await insurance approval.    Pt accompanied by: [name]  PERTINENT HISTORY: Pt. Was admitted to [facility] on 07/13/2023 after sustaining a CVA while on a trip to the beach. Pt. Was diagnosed with R MCA CVA.  Pt. Was admitted to inpatient rehab from 07/19/2023-08/13/2023. Past medical history includes high BP, anemia, and Myasthenia Gravis.   PRECAUTIONS: None  WEIGHT BEARING RESTRICTIONS: No  PAIN: 04/02/24: No pain  FALLS: Has patient fallen in last 6 months? Yes  LIVING ENVIRONMENT: Lives with: lives with their family and lives with their spouse-  Lives in: House/apartment- 2 story home and resides mostly on the 1st floor Stairs: No- Ramped entrance Has following equipment at home:  Vannie, cane, Steadi, shower chair, handheld shower head, bedside commode  PLOF: Independent and Independent  with basic ADLs Independent at home   PATIENT GOALS: Would like to walk 10-20 feet each time. 01/16/24: Update: pt has restarted PT visits and will be continuing to focus on mobility in those sessions.  Pt continues to work towards improving functional use of the LUE during OT visits.   OBJECTIVE:  Note: Objective measures were completed at Evaluation unless otherwise noted.  HAND DOMINANCE: Right  ADLs: Overall ADLs:  Transfers/ambulation related to ADLs: Eating: Independent, 100% R handed Grooming: independent UB Dressing: Can put on shirt independently LB Dressing: Difficulty with pants and requires assistance getting the pants on his feet, has difficulty putting on shoes and socks Toileting: Tot A for toilet hygiene. Uses Steadi during toilet transfers. Bathing: Requires assist with using the L arm to wash the R arm. Tub Shower transfers: Roll in shower, built in shower chair, grab bars and hand held shower head.  IADLs: Shopping: Total A (Wife typically did the shopping prior to onset)   Light housekeeping: Total A (wife typically performs prior to onset) Meal Prep: Independent with light snack prep Community mobility: No driving, able to get in/out of the car Medication management: Set up assistance from his wife using a pill box (pt. Is responsible for taking medications at the correct time) Financial management: family members check behind him. Handwriting: TBD Hobbies: gardening, wood working and sports- [employer] Work history: Owns a tax business  MOBILITY STATUS: Needs Assist:    POSTURE COMMENTS:  Sitting balance: Good supported sitting balance  FUNCTIONAL OUTCOME MEASURES:  Fugl-Meyer: see below  UPPER EXTREMITY ROM:    Active ROM Right Eval  [facility] Right 09/23/2023 [facility] Left eval Left 09/23/2023 Left 10/29/23 Left 12/02/23 Left 12/31/23 Left 02/11/24 Left 03/24/24  Shoulder flexion   89(110) 109(114) 111(115) 90 before scaption (110) 95 before scaption (110) 100  before scaption (110) 110 before scaption (113)  Shoulder abduction   98(120) 109(120) 111(122) 100 (110) 105(118) 107(121) 114(121)  Shoulder adduction           Shoulder extension           Shoulder internal rotation           Shoulder external rotation           Elbow flexion   120(140) 130(136) 141(145)  146 146 146  Elbow extension   -28(-10) -5(0) -18(0)  -3(0) 0 0  Wrist flexion   60(60) Rest at 60 50(65) 54(60)    55(64)  Wrist extension   -48(42) 10(40) 20(50) 30 (45) 30(45) 45(54) 45(54)  Wrist ulnar deviation     12(22)      Wrist radial deviation     10(20)      Wrist pronation     (82)90   85 85  Wrist supination     64(74) 70 (75)  34(74) 45(74)  (Blank rows = not tested)  Eval: L Digit flexion: 2nd:3cm (0cm) 3rd: 0cm (0cm), 4th:  0cm (0cm), 5th: 2cm (0cm)  09/23/23:  L Digit flexion to [facility]: 2nd: 3.5cm (0cm), 3rd: 4 cm(0cm), 4th 3.5cm (0cm), 5th: 2cm (0cm)  Eval:  L Digit extension: Active Gross digit extension through 10% of ROM  09/23/23:  L Digit extension: Active Gross digit extension through 50% of ROM  10/29/23:  L Digit extension: Active Gross digit extension through 25-50% of ROM  12/02/23:  L digit extension: Active gross ext through 30%   12/31/23:  L digit extension:  Active gross ext through ~30-40%   02/11/24:  Left full composite fist  L digit extension:  Active gross ext through 60% range  UPPER EXTREMITY MMT:     MMT Right eval Left eval Left 09/23/2023 Left 10/29/23 Left 12/02/23 Left 12/31/23 Left 02/11/24 Left 03/14/24  Shoulder flexion 5 3- 3-/5 3-/5  3-/5 3-/5 3-/5  Shoulder abduction 5 3- 3-/5 3-/5  3-5 3-/5 3-/5  Shoulder adduction          Shoulder extension          Shoulder internal rotation          Shoulder external rotation          Middle trapezius          Lower trapezius          Elbow flexion 5 TBD 3+/5 4-/5 4+/5 4+/5 4+/5 4+/5  Elbow extension 5 TBD 4-/5 3-/5 4+/5 4+/5 4+/5 4+/5  Wrist flexion   2/5  3-/5 3-     Wrist extension  2- 2/5 2+/5 3- 3-/5 3-/5 3-/5  Wrist ulnar deviation          Wrist radial deviation          Wrist pronation    3/5 3+  3+/5 3+/5  Wrist supination    3-/5 3-  3-/5 3-/5  (Blank rows = not tested)  HAND FUNCTION:  Eval:  Grip strength: Right: 94#, Left: 1#,  Lateral Key Pinch strength: Right: 14#, Left: 3#  10/29/23:  Grip strength: Left: 16# Lateral Key Pinch strength: Left: 5#  12/02/23: L: 5 lbs, R: 98 # Lateral pinch: Left: 5 lbs   12/31/23:  Grip strength: Right: 105#, Left: 14#,   02/11/24: Grip strength: Left: 22#  03/24/24:  Grip strength: Left: 18#  Lateral Pinch strength: Left 9#  COORDINATION: Box and Blocks: Right: 34 blocks completed in 1 min., Left: 0 blocks completed in 1 min.   10/29/23:  Box and Blocks: Right: 34 blocks completed in 1 min., Left: 2 blocks completed in 1 min.  9 Hole Peg Test: Left: To remove 9 pegs from a vertical position: 1 min. &13 sec.  12/02/23: Box and Blocks: 4 blocks 12/02/23: 4 pegs out in 3 min (multiple pulled and dropped out of the cup)   12/02/23: Box and Blocks Test: 5 blocks in 1 min.  02/10/24: Box and Blocks Test: 5 blocks in 1 min.  02/11/24: 1 min. & 18 sec. to remove 9 pegs.  Box and Blocks: 1 block in 1 min.   03/24/24: 47 sec. to remove 9 pegs. Box and Blocks: 3 blocks in 1 min.    FUGL-MEYER ASSESSMENT   A. Upper Extremity   I. Reflex Activity:   12/12/23   Flexors (biceps) 2   Extensors (triceps) 2   Total 4/4                 II. Volitional Movement within Synergies:   12/12/23   Flexor Synergy:  Shoulder Elevation 2   Shoulder Retraction 2   Shoulder Abduction (at least 90*) 2   External Rotation 1   Elbow Flexion 2   Forearm Supination 1   Extensor Synergy:      Shoulder Adduction/Internal Rotation 2   Elbow Extension 1   Forearm Pronation 2   Total 15/18     III. Movement Combining Synergies:   12/12/23   Hand to Lumbar Spine 1   Shoulder Flexion at 90*,  elbow at 0* 0   Pron/Sup w/elbow at 90* and shoulder at 0* 1   Total 2/6     IV. Movement Out of Synergy   12/12/23   Shoulder Abduction to 90*, elbow at 0*, forearm pronated 0   Shoulder Flexion 9)-180*, elbow at 0*, forearm in mid position 0   Pron/sup, elbow at 0* and shoulder between 30-90* of flexion 0   Total 0/6     B. Wrist   12/12/23   Stability of wrist at 15* ext, elbow at 90*, shoulder 0* 2   Repeated flex/ext, elbow at 90*, shoulder 0* 1   Stability of wrist at 15* ext, elbow at 0*, shoulder 30* 0   Flex/ext, elbow 0*, shoulder 30* 0   Circumduction  1   Total 4/10     C. Hand   12/12/23   Mass Flexion 1   Mass Extension 1   Total 2/4     D. Grasp   12/12/23   Hook  0   Thumb Adduction - paper 1   Pincer - pen 0   Cylindrical - cup/can 0   Spherical - tennis ball 1   Total 2/10     E. Coordination/Speed   12/12/23   Tremor 2   Dysmetria 0   Time 1   Total 3/6     FUGL-MEYER ASSESSMENT: UPPER EXTREMITY A-E TOTAL on (date): 32/66   SENSATION: Sensation feels different in both hands.  L hand Light touch- impaired L hand Proprioception- impaired  EDEMA: Has had hx of edema, and came to Eval session with swelling in the L hand/wrist/forearm.   *pt. has a laceration from his dog on the dorsal aspect of the L hand.   MUSCLE TONE: Increased tone through the UE and hand flexors.   COGNITION: Overall cognitive status: Within functional limits for tasks assessed  VISION: Subjective report: L sided inattention Baseline vision: Wears glasses for reading only Visual history: Right visual occlusion  VISION ASSESSMENT: To be assessed  PERCEPTION: TBD  PRAXIS: Impaired                                                                                                                 TREATMENT DATE: 04/02/24 Self Care: -Continued Q&A with pt and spouse re: their Vivistim questions after pt was approved today by surgeon, Dr. Jacklyn to proceed with procedure.   Reviewed pt's personal goals for L hand function, answered questions re: stimulation provided by therapist in clinic and via magnet at home, frequency of  magnet swipes, activities for swiping, expectations for post op healing and therapy time line post surgery.  Pt continues to demo excellent motivation with therapy, while spouse remains extremely supportive in pt's therapy by ensuring pt's consistent attendance to OT sessions, and helping to facilitate pt's participation in therapeutic activities in the home, all factors which continue to make pt an excellent candidate for surgery.   Therapeutic Exercise: -Completed intermittent passive stretching for L wrist and digit ext, forearm supination, and elbow extension, requiring OT assist for proximal and distal support of the LUE to isolate above noted planes of movement while reducing compensatory movements.  OT assist needed to maximize end ranges for increasing engagement of the L arm into tasks noted below.  Therapeutic Activity: -Facilitated L hand grasp/release task practice and LUE GMC working to pick up and place circular blocks at targets on table top.   -Facilitated L hand pron/sup and wrist flex/ext working to grasp circular blocks with pronated forearm, moving to supinated forearm and digit extension to allow OT to take block from hand without pt dropping block to floor. -Simulated turning door knob requiring pron/sup, wrist ext, and simultaneous digit flexion to sustain grasp of end of 2 lb dumbbell held by OT; mod vc provided for motor sequencing.  Education: Education details: Vivistim implantation and subsequent therapy expectations Person educated: Patient Education method: Explanation and Verbal cues Education comprehension: verbalized understanding and verbal cues required  HOME EXERCISE PROGRAM: -grasping and stacking 1, and 1/2 cubes form the tabletop surface; yellow theraband for LUE strengthening   GOALS: Goals reviewed with  patient? Yes  SHORT TERM GOALS: Target date:  05/05/2024     Pt. Will be independent with HEP for the LUE.  Baseline: 03/14/24: Independent 02/11/24: Independent 12/31/23: Independent; ongoing 12/03/23: indep with current HEP, though ongoing progressions are being made as UE function improves; 10/29/23: Pt. requires assist from wife with HEPs. Pt. Is consistently working on HEP's at home. 09/19/23: Requires assist from his wife Eval; no current HEP Goal status: Achieved,Ongoing  LONG TERM GOALS: Target date: 06/16/2024  Pt. Will increase L shoulder flexion by 10 degrees to be able to reach up to shelves. Baseline: 03/12/24: 110 before scaption (113) 02/11/24: 100 before scaption (110) 12/31/23: Left: 95 degrees before scaption, 110 of scaption 12/03/23: 90* before onset of scaption; 10/29/23: 111(115) 09/19/23: Pt. Continues to be limited with reaching up to shelves. Eval: L shoulder flexion is 89 (110). Goal status: progressing, Ongoing  2.  Pt. Will increase L shoulder abduction by 10 degrees to be able to  reach up to shampoo hair.  Baseline: 12/230/25: 114(121) Pt. Is able to complete underarm hygiene.02/11/24: 892(878) 12/31/23: Left: 894(881) 12/03/23: 100*; pt able to demo bilat underarm hygiene with set up; 10/29/23: 888(877) 09/19/23: Pt. Continues to require assist with underarm hygiene.Eval: L shoulder abduction is 98 (120).  Goal status: Goal revised for shampooing hair (12/30- Achieved goal for underarm care )  3.  Pt. Will improve L wrist extension by 10 degrees to be able to initiate anticipation of grasping for objects.  Baseline:03/24/24: 45(54) 02/11/24: 45(54) 12/31/23: 30(45) 12/03/23: 30* (45); 10/29/23: 20(55) 09/19/23: Consistent activation noted in the left wrist extensors with facilitation. Eval: -48 (42) Goal status: Ongoing  4.  Pt. Will improve active gross digit extension through 50% of the range to be able to release objects in his hand consistently.  Baseline:03/24/24: Active  gross digit extension through  65% of the range 02/11/24: L digit extension: Active gross ext  through 60% range 12/31/23: Gross digit extension: ~30-40% 12/03/23: Gross digit ext through ~30% of range (limited by flexor tone in the PIPs); 10/28/20: Gross digit extension through 25-50% of the range. 09/19/23: gross digit extension noted through 25% range with facilitation.Eval: gross digit extension is 10% of the range  Goal status: Ongoing  5.  Pt. Will independently demonstrate visual compensatory strategies when navigating through environment and during tabletop tasks. Baseline: 12/03/23: Pt demonstrates consistent head turns to scan to L visual field without cueing; 10/29/2023: Pt. Continues to less cuing for left sided awareness. 09/19/23: Pt. requires fewer cues for left sided awareness Eval: Pt. Requires consistent cuing for L sided awareness.  Goal status: achieved   6. Pt. Will improve left hand [facility] skills to be able to independently manipulate small objects during ADLs, and IADLs. Baseline: 03/24/24: 9 Hole Peg Test: 47 sec. to remove 9 pegs. 02/11/24: 9 Hole Peg Test:1 min. & 18 sec. to remove 9 pegs.12/31/23: Pt. Is progressing to be able to grasp progressively smaller objects. 12/03/23: Pt removed 3 pegs in >3 min, pulling out more than 3 but repeatedly dropping to floor or table top rather than assessment dish; 10/29/23: 9 Hole Peg Test: Pt. Was able to remove 9 pegs from vertical position on the pegboard in 1 min. & 13 sec. Pt. Is not yet able to pick up pegs from a horizontal position to place them in the pegboard.   Goal status: ongoing   7. Pt will improve left hand function skills as evidenced by improved score to 4 blocks on the Box and Blocks test. Baseline: 03/24/24: Box and Blocks: 3 blocks in 1 min.  02/11/24 Box and Blocks: 1 block in 1 min. 12/31/23: 4 blocks in 1 min. 12/03/23: 4 blocks after 3 trials and passive wrist and digit stretching between trials (not yet consistent to achieve 4  blocks); 10/29/23: Box and Blocks Test: 2 Blocks completed in 1 min.   Goal Status: ongoing    8. Pt. Will independently be able to efficiently place his left hand on, and hold the walker handle.   Baseline: 03/24/24: Pt. Is unable to efficiently place his left hand on, and hold the walker handle.   Goal status: New  ASSESSMENT:  CLINICAL IMPRESSION:  Goals updated last session for recert, carried over for progress update today.  Pt continues to demonstrate excellent candidacy for Vivistim implant re: therapy considerations, and was confirmed to be an appropriate surgical candidate today by Dr. [facility]lyn at [facility].  Pt verbalizes positivity towards surgery and has been able to identify personal goals which he would like to work towards, including use of L hand to point and peck on his keyboard to increase efficiency with work related typing.  Pt continues to work towards improved accuracy with grasp/release task practice, as limited grasp caused frequent dropping of items from hand.  Items also frequently drop from hand when pt attempts to open hand with a supinated forearm, d/t pt able to actively supinate forearm about 50% of full range.  With simulation to turn a door knob, pt struggled with performing sufficient wrist ext and sustaining grasp of knob (end of dumbbell) with simultaneous forearm supination, and required motor sequencing cues throughout.  Per recert note, pt has improved with left shoulder ROM, left pinch strength, left FMC, and left hand function skills as evident by the above measurements obtained. Pt. continues to improve with his ability to engage his LUE during daily ADL, and IADL tasks.  Pt. has met the goal for being able to complete underarm care, with the goal now revised to reaching further to be able to shampoo his hair. A new goal has been added to the POC for him to be able to efficiently place, and hold the walker with the left hand for safe walker use.  Although pt has  made progress, he continues to present with LUE weakness and incoordination which continues to impact performance with ADLs, and IADL tasks.  Pt. continues to benefit from OT services to work on improving LUE functioning to increase engagement of the LUE during daily tasks, and to provide education about compensatory strategies during ADLs/IADLs.   PERFORMANCE DEFICITS: in functional skills including ADLs, IADLs, coordination, dexterity, proprioception, sensation, edema, tone, ROM, strength, pain, Fine motor control, Gross motor control, mobility, balance, endurance, vision, and UE functional use, cognitive skills including perception, and psychosocial skills including coping strategies, environmental adaptation, and routines and behaviors.   IMPAIRMENTS: are limiting patient from ADLs, IADLs, and leisure.   CO-MORBIDITIES: may have co-morbidities  that affects occupational performance. Patient will benefit from skilled OT to address above impairments and improve overall function.  MODIFICATION OR ASSISTANCE TO COMPLETE EVALUATION: Min-Moderate modification of tasks or assist with assess necessary to complete an evaluation.  OT OCCUPATIONAL PROFILE AND HISTORY: Detailed assessment: Review of records and additional review of physical, cognitive, psychosocial history related to current functional performance.  CLINICAL DECISION MAKING: Moderate - several treatment options, min-mod task modification necessary  REHAB POTENTIAL: Good  EVALUATION COMPLEXITY: Moderate  PLAN:  OT FREQUENCY: 2x/week  OT DURATION: 12 weeks  PLANNED INTERVENTIONS: 97168 OT Re-evaluation, 97535 self care/ADL training, 02889 therapeutic exercise, 97530 therapeutic activity, 97112 neuromuscular re-education, 97140 manual therapy, 97018 paraffin, 02989 moist heat, 97010 cryotherapy, 97034 contrast bath, 97760 Orthotic Initial, 97763 Orthotic/Prosthetic subsequent, passive range of motion, visual/perceptual  remediation/compensation, energy conservation, and DME and/or AE instructions  RECOMMENDED OTHER SERVICES: PT and ST  CONSULTED AND AGREED WITH PLAN OF CARE: Patient and family member/caregiver  PLAN FOR NEXT SESSION: see above  Inocente Blazing, MS, OTR/L   04/05/2024, 5:00 PM   "

## 2024-04-06 NOTE — Progress Notes (Signed)
 Faxed Medical Clearance request to [facility] on 04/03/2024 to [phone]. Transmission confirmation received.

## 2024-04-07 ENCOUNTER — Ambulatory Visit

## 2024-04-07 ENCOUNTER — Encounter: Attending: Physician Assistant | Admitting: Physician Assistant

## 2024-04-07 DIAGNOSIS — L97811 Non-pressure chronic ulcer of other part of right lower leg limited to breakdown of skin: Secondary | ICD-10-CM | POA: Insufficient documentation

## 2024-04-07 DIAGNOSIS — E11622 Type 2 diabetes mellitus with other skin ulcer: Secondary | ICD-10-CM | POA: Insufficient documentation

## 2024-04-07 DIAGNOSIS — L97821 Non-pressure chronic ulcer of other part of left lower leg limited to breakdown of skin: Secondary | ICD-10-CM | POA: Insufficient documentation

## 2024-04-07 DIAGNOSIS — I890 Lymphedema, not elsewhere classified: Secondary | ICD-10-CM | POA: Diagnosis present

## 2024-04-07 DIAGNOSIS — M6281 Muscle weakness (generalized): Secondary | ICD-10-CM | POA: Diagnosis not present

## 2024-04-07 DIAGNOSIS — I69354 Hemiplegia and hemiparesis following cerebral infarction affecting left non-dominant side: Secondary | ICD-10-CM

## 2024-04-07 DIAGNOSIS — R278 Other lack of coordination: Secondary | ICD-10-CM

## 2024-04-08 NOTE — Therapy (Signed)
 " Outpatient Occupational Therapy Neuro Progress and Treatment Note Reporting period beginning 02/11/24-04/02/24  Patient Name: [name]. MRN: [id number] DOB:04-Jul-1950, 74 y.o., male Today's Date: 04/08/2024  PCP: Alm Rower, MD REFERRING PROVIDER: Hermann Toribio PARAS, PA-C  END OF SESSION:  OT End of Session - 04/08/24 2013     Visit Number 61    Number of Visits 72    Date for Recertification  06/17/23    Authorization Type 3 OT vst from 01/06-02/03/26    Authorization - Visit Number 2    Authorization - Number of Visits 3    Progress Note Due on Visit 70    OT Start Time 1445    OT Stop Time 1530    OT Time Calculation (min) 45 min    Activity Tolerance Patient tolerated treatment well    Behavior During Therapy WFL for tasks assessed/performed         Past Medical History:  Diagnosis Date   Anemia    Cervical myelopathy (HCC)    Chronic venous insufficiency    [name] (chronic kidney disease), stage III (HCC)    Diabetes mellitus without complication (HCC)    GERD (gastroesophageal reflux disease)    Hypercholesteremia    Hypothyroidism    Kidney stones    about 50 in the past--last in March 4/24   Leg weakness, bilateral    due to Myasthenia Gravis   Myasthenia gravis (HCC)    Spinal stenosis    Vitamin B 12 deficiency    Vitamin B 12 deficiency    Wears hearing aid    bilateral   Past Surgical History:  Procedure Laterality Date   BACK SURGERY  09/2015   CARDIAC CATHETERIZATION     CATARACT EXTRACTION W/ INTRAOCULAR LENS IMPLANT     CATARACT EXTRACTION W/PHACO Right 12/21/2015   Procedure: CATARACT EXTRACTION PHACO AND INTRAOCULAR LENS PLACEMENT (IOC);  Surgeon: Dene Etienne, MD;  Location: [facility];  Service: Ophthalmology;  Laterality: Right;  DIABETIC - insulin  and oral meds   COLONOSCOPY     SHOULDER SURGERY     labrum repair   TONSILLECTOMY     Patient Active Problem List   Diagnosis Date Noted   Acquired left foot  drop 01/10/2024   Left spastic hemiplegia (HCC) 01/10/2024   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) 10/11/2023   High blood pressure 08/13/2023   Anemia 08/13/2023   Constipation 08/13/2023   Myasthenia gravis (HCC) 08/13/2023   Acute ischemic right MCA stroke (HCC) 07/19/2023   Lymphedema 12/27/2016   Leg pain 07/15/2016   Chronic venous insufficiency 07/15/2016   Swelling of limb 07/15/2016   Type 2 diabetes mellitus (HCC) 07/15/2016   Hyperlipidemia 07/15/2016   ONSET DATE: 07/13/2023  REFERRING DIAG: Acute ischemic R MCA CVA  THERAPY DIAG: muscle weakness (generalized), other lack of coordination, vision disturbance, hemiplegia and hemiparesis following cerebral infarction affecting left non-dominant side (HCC)  Rationale for Evaluation and Treatment: Rehabilitation  SUBJECTIVE:  SUBJECTIVE STATEMENT: Pt reports minimal ambulation at home over the last week because of the weather being colder.   Pt accompanied by: [name]  PERTINENT HISTORY: Pt. Was admitted to [facility] on 07/13/2023 after sustaining a CVA while on a trip to the beach. Pt. Was diagnosed with R MCA CVA. Pt. Was admitted to inpatient rehab from 07/19/2023-08/13/2023. Past medical history includes high BP, anemia, and Myasthenia Gravis.   PRECAUTIONS: None  WEIGHT BEARING RESTRICTIONS: No  PAIN: 04/07/24:  No pain  FALLS: Has patient fallen in last 6 months? Yes  LIVING ENVIRONMENT: Lives with: lives with their family and lives with their spouse-  Lives in: House/apartment- 2 story home and resides mostly on the 1st floor Stairs: No- Ramped entrance Has following equipment at home:  Vannie, cane, Steadi, shower chair, handheld shower head, bedside commode  PLOF: Independent and Independent with basic ADLs Independent at home   PATIENT GOALS: Would like to walk 10-20 feet each time. 01/16/24: Update: pt has restarted PT visits and will be continuing to focus on  mobility in those sessions.  Pt continues to work towards improving functional use of the LUE during OT visits.   OBJECTIVE:  Note: Objective measures were completed at Evaluation unless otherwise noted.  HAND DOMINANCE: Right  ADLs: Overall ADLs:  Transfers/ambulation related to ADLs: Eating: Independent, 100% R handed Grooming: independent UB Dressing: Can put on shirt independently LB Dressing: Difficulty with pants and requires assistance getting the pants on his feet, has difficulty putting on shoes and socks Toileting: Tot A for toilet hygiene. Uses Steadi during toilet transfers. Bathing: Requires assist with using the L arm to wash the R arm. Tub Shower transfers: Roll in shower, built in shower chair, grab bars and hand held shower head.  IADLs: Shopping: Total A (Wife typically did the shopping prior to onset)   Light housekeeping: Total A (wife typically performs prior to onset) Meal Prep: Independent with light snack prep Community mobility: No driving, able to get in/out of the car Medication management: Set up assistance from his wife using a pill box (pt. Is responsible for taking medications at the correct time) Financial management: family members check behind him. Handwriting: TBD Hobbies: gardening, wood working and sports- [employer] Work history: Owns a tax business  MOBILITY STATUS: Needs Assist:    POSTURE COMMENTS:  Sitting balance: Good supported sitting balance  FUNCTIONAL OUTCOME MEASURES:  Fugl-Meyer: see below  UPPER EXTREMITY ROM:    Active ROM Right Eval  [facility] Right 09/23/2023 [facility] Left eval Left 09/23/2023 Left 10/29/23 Left 12/02/23 Left 12/31/23 Left 02/11/24 Left 03/24/24  Shoulder flexion   89(110) 109(114) 111(115) 90 before scaption (110) 95 before scaption (110) 100 before scaption (110) 110 before scaption (113)  Shoulder abduction   98(120) 109(120) 111(122) 100 (110) 105(118) 107(121) 114(121)  Shoulder adduction           Shoulder  extension           Shoulder internal rotation           Shoulder external rotation           Elbow flexion   120(140) 130(136) 141(145)  146 146 146  Elbow extension   -28(-10) -5(0) -18(0)  -3(0) 0 0  Wrist flexion   60(60) Rest at 60 50(65) 54(60)    55(64)  Wrist extension   -48(42) 10(40) 20(50) 30 (45) 30(45) 45(54) 45(54)  Wrist ulnar deviation     12(22)      Wrist radial deviation     10(20)      Wrist pronation     (82)90   85 85  Wrist supination     64(74) 70 (75)  34(74) 45(74)  (Blank rows = not tested)  Eval: L Digit flexion: 2nd:3cm (0cm) 3rd: 0cm (0cm), 4th: 0cm (0cm), 5th: 2cm (0cm)  09/23/23:  L Digit flexion to [facility]: 2nd: 3.5cm (0cm), 3rd: 4 cm(0cm), 4th 3.5cm (0cm), 5th: 2cm (0cm)  Eval:  L Digit  extension: Active Gross digit extension through 10% of ROM  09/23/23:  L Digit extension: Active Gross digit extension through 50% of ROM  10/29/23:  L Digit extension: Active Gross digit extension through 25-50% of ROM  12/02/23:  L digit extension: Active gross ext through 30%   12/31/23:  L digit extension:  Active gross ext through ~30-40%   02/11/24:  Left full composite fist  L digit extension:  Active gross ext through 60% range  UPPER EXTREMITY MMT:     MMT Right eval Left eval Left 09/23/2023 Left 10/29/23 Left 12/02/23 Left 12/31/23 Left 02/11/24 Left 03/14/24  Shoulder flexion 5 3- 3-/5 3-/5  3-/5 3-/5 3-/5  Shoulder abduction 5 3- 3-/5 3-/5  3-5 3-/5 3-/5  Shoulder adduction          Shoulder extension          Shoulder internal rotation          Shoulder external rotation          Middle trapezius          Lower trapezius          Elbow flexion 5 TBD 3+/5 4-/5 4+/5 4+/5 4+/5 4+/5  Elbow extension 5 TBD 4-/5 3-/5 4+/5 4+/5 4+/5 4+/5  Wrist flexion   2/5 3-/5 3-     Wrist extension  2- 2/5 2+/5 3- 3-/5 3-/5 3-/5  Wrist ulnar deviation          Wrist radial deviation          Wrist pronation    3/5 3+  3+/5 3+/5  Wrist  supination    3-/5 3-  3-/5 3-/5  (Blank rows = not tested)  HAND FUNCTION:  Eval:  Grip strength: Right: 94#, Left: 1#,  Lateral Key Pinch strength: Right: 14#, Left: 3#  10/29/23:  Grip strength: Left: 16# Lateral Key Pinch strength: Left: 5#  12/02/23: L: 5 lbs, R: 98 # Lateral pinch: Left: 5 lbs   12/31/23:  Grip strength: Right: 105#, Left: 14#,   02/11/24: Grip strength: Left: 22#  03/24/24:  Grip strength: Left: 18#  Lateral Pinch strength: Left 9#  COORDINATION: Box and Blocks: Right: 34 blocks completed in 1 min., Left: 0 blocks completed in 1 min.   10/29/23:  Box and Blocks: Right: 34 blocks completed in 1 min., Left: 2 blocks completed in 1 min.  9 Hole Peg Test: Left: To remove 9 pegs from a vertical position: 1 min. &13 sec.  12/02/23: Box and Blocks: 4 blocks 12/02/23: 4 pegs out in 3 min (multiple pulled and dropped out of the cup)   12/02/23: Box and Blocks Test: 5 blocks in 1 min.  02/10/24: Box and Blocks Test: 5 blocks in 1 min.  02/11/24: 1 min. & 18 sec. to remove 9 pegs.  Box and Blocks: 1 block in 1 min.   03/24/24: 47 sec. to remove 9 pegs. Box and Blocks: 3 blocks in 1 min.    FUGL-MEYER ASSESSMENT   A. Upper Extremity   I. Reflex Activity:   12/12/23   Flexors (biceps) 2   Extensors (triceps) 2   Total 4/4                 II. Volitional Movement within Synergies:   12/12/23   Flexor Synergy:      Shoulder Elevation 2   Shoulder Retraction 2   Shoulder Abduction (at least 90*) 2   External Rotation 1   Elbow Flexion 2  Forearm Supination 1   Extensor Synergy:      Shoulder Adduction/Internal Rotation 2   Elbow Extension 1   Forearm Pronation 2   Total 15/18     III. Movement Combining Synergies:   12/12/23   Hand to Lumbar Spine 1   Shoulder Flexion at 90*, elbow at 0* 0   Pron/Sup w/elbow at 90* and shoulder at 0* 1   Total 2/6     IV. Movement Out of Synergy   12/12/23   Shoulder Abduction to 90*, elbow at 0*, forearm  pronated 0   Shoulder Flexion 9)-180*, elbow at 0*, forearm in mid position 0   Pron/sup, elbow at 0* and shoulder between 30-90* of flexion 0   Total 0/6     B. Wrist   12/12/23   Stability of wrist at 15* ext, elbow at 90*, shoulder 0* 2   Repeated flex/ext, elbow at 90*, shoulder 0* 1   Stability of wrist at 15* ext, elbow at 0*, shoulder 30* 0   Flex/ext, elbow 0*, shoulder 30* 0   Circumduction  1   Total 4/10     C. Hand   12/12/23   Mass Flexion 1   Mass Extension 1   Total 2/4     D. Grasp   12/12/23   Hook  0   Thumb Adduction - paper 1   Pincer - pen 0   Cylindrical - cup/can 0   Spherical - tennis ball 1   Total 2/10     E. Coordination/Speed   12/12/23   Tremor 2   Dysmetria 0   Time 1   Total 3/6     FUGL-MEYER ASSESSMENT: UPPER EXTREMITY A-E TOTAL on (date): 32/66   SENSATION: Sensation feels different in both hands.  L hand Light touch- impaired L hand Proprioception- impaired  EDEMA: Has had hx of edema, and came to Eval session with swelling in the L hand/wrist/forearm.   *pt. has a laceration from his dog on the dorsal aspect of the L hand.   MUSCLE TONE: Increased tone through the UE and hand flexors.   COGNITION: Overall cognitive status: Within functional limits for tasks assessed  VISION: Subjective report: L sided inattention Baseline vision: Wears glasses for reading only Visual history: Right visual occlusion  VISION ASSESSMENT: To be assessed  PERCEPTION: TBD  PRAXIS: Impaired                                                                                                                 TREATMENT DATE: 04/07/24 Neuro re-ed: -Facilitated WB through the L hand while seated on mat table; mod A to place palm flat with digits extended on mat table.  Provided stabilization at elbow and wrist during A/P and lateral weight shifts, and in prep for multiple STS transfers with L hand in WB position. -Facilitated A/P and lateral weight  shifts in standing, with OT providing stability at L knee to prevent buckling and hyperextension, min A for standing balance.  -Facilitated  L hand digit ext with visual target for increasing end range, with assist for palm flat on table top between every few reps for tone normalization -Facilitated L hand digit abd with visual target for increasing end range; assist to perform with palm flat on table top in WB position for normalization of tone  Therapeutic Activity: -Facilitated dynamic standing, LUE forward and lateral reaching patterns, and grasp release, working to move [employer] across levels 1-3 of Saebo tower, and moving rings between levels 1 and 2 with max A for grasp/release from ring handle or from ball.  Mod vc for motor sequencing and lateral weight shifting in standing.   Education: Education details: active digit ext and abd trials in home with slight wrist flexion to more easily achieve digit ext  Person educated: Patient Education method: Explanation, Demonstration, Tactile cues, and Verbal cues Education comprehension: verbalized understanding, returned demonstration, verbal cues required, and tactile cues required  HOME EXERCISE PROGRAM: -grasping and stacking 1, and 1/2 cubes form the tabletop surface; yellow theraband for LUE strengthening   GOALS: Goals reviewed with patient? Yes  SHORT TERM GOALS: Target date:  05/05/2024     Pt. Will be independent with HEP for the LUE.  Baseline: 03/14/24: Independent 02/11/24: Independent 12/31/23: Independent; ongoing 12/03/23: indep with current HEP, though ongoing progressions are being made as UE function improves; 10/29/23: Pt. requires assist from wife with HEPs. Pt. Is consistently working on HEP's at home. 09/19/23: Requires assist from his wife Eval; no current HEP Goal status: Achieved,Ongoing  LONG TERM GOALS: Target date: 06/16/2024  Pt. Will increase L shoulder flexion by 10 degrees to be able to reach up to  shelves. Baseline: 03/12/24: 110 before scaption (113) 02/11/24: 100 before scaption (110) 12/31/23: Left: 95 degrees before scaption, 110 of scaption 12/03/23: 90* before onset of scaption; 10/29/23: 111(115) 09/19/23: Pt. Continues to be limited with reaching up to shelves. Eval: L shoulder flexion is 89 (110). Goal status: progressing, Ongoing  2.  Pt. Will increase L shoulder abduction by 10 degrees to be able to  reach up to shampoo hair.  Baseline: 12/230/25: 114(121) Pt. Is able to complete underarm hygiene.02/11/24: 892(878) 12/31/23: Left: 894(881) 12/03/23: 100*; pt able to demo bilat underarm hygiene with set up; 10/29/23: 888(877) 09/19/23: Pt. Continues to require assist with underarm hygiene.Eval: L shoulder abduction is 98 (120).  Goal status: Goal revised for shampooing hair (12/30- Achieved goal for underarm care )  3.  Pt. Will improve L wrist extension by 10 degrees to be able to initiate anticipation of grasping for objects.  Baseline:03/24/24: 45(54) 02/11/24: 45(54) 12/31/23: 30(45) 12/03/23: 30* (45); 10/29/23: 20(55) 09/19/23: Consistent activation noted in the left wrist extensors with facilitation. Eval: -48 (42) Goal status: Ongoing  4.  Pt. Will improve active gross digit extension through 50% of the range to be able to release objects in his hand consistently.  Baseline:03/24/24: Active gross digit extension through  65% of the range 02/11/24: L digit extension: Active gross ext through 60% range 12/31/23: Gross digit extension: ~30-40% 12/03/23: Gross digit ext through ~30% of range (limited by flexor tone in the PIPs); 10/28/20: Gross digit extension through 25-50% of the range. 09/19/23: gross digit extension noted through 25% range with facilitation.Eval: gross digit extension is 10% of the range  Goal status: Ongoing  5.  Pt. Will independently demonstrate visual compensatory strategies when navigating through environment and during tabletop tasks. Baseline: 12/03/23: Pt demonstrates  consistent head turns to scan to L visual field  without cueing; 10/29/2023: Pt. Continues to less cuing for left sided awareness. 09/19/23: Pt. requires fewer cues for left sided awareness Eval: Pt. Requires consistent cuing for L sided awareness.  Goal status: achieved   6. Pt. Will improve left hand [facility] skills to be able to independently manipulate small objects during ADLs, and IADLs. Baseline: 03/24/24: 9 Hole Peg Test: 47 sec. to remove 9 pegs. 02/11/24: 9 Hole Peg Test:1 min. & 18 sec. to remove 9 pegs.12/31/23: Pt. Is progressing to be able to grasp progressively smaller objects. 12/03/23: Pt removed 3 pegs in >3 min, pulling out more than 3 but repeatedly dropping to floor or table top rather than assessment dish; 10/29/23: 9 Hole Peg Test: Pt. Was able to remove 9 pegs from vertical position on the pegboard in 1 min. & 13 sec. Pt. Is not yet able to pick up pegs from a horizontal position to place them in the pegboard.   Goal status: ongoing   7. Pt will improve left hand function skills as evidenced by improved score to 4 blocks on the Box and Blocks test. Baseline: 03/24/24: Box and Blocks: 3 blocks in 1 min.  02/11/24 Box and Blocks: 1 block in 1 min. 12/31/23: 4 blocks in 1 min. 12/03/23: 4 blocks after 3 trials and passive wrist and digit stretching between trials (not yet consistent to achieve 4 blocks); 10/29/23: Box and Blocks Test: 2 Blocks completed in 1 min.   Goal Status: ongoing    8. Pt. Will independently be able to efficiently place his left hand on, and hold the walker handle.   Baseline: 03/24/24: Pt. Is unable to efficiently place his left hand on, and hold the walker handle.   Goal status: New  ASSESSMENT:  CLINICAL IMPRESSION:  Focus today on increasing WB through the LUE and LLE with seated and standing therapeutic and neuro re-ed activities noted above.  Pt requires assist for placing L hand in WB on mat table while seated, and assist for knee stability while facilitating L  lateral WS through the leg in standing while reaching outside BOS with the LUE to reach for rings on Saebo tower.  Pt utilized intermittent support of walker on the R arm as needed for dynamic standing tasks.  Reviewed tone normalization strategies with WB positions through the hand, and strategies for increasing active digit abd and ext range using slight wrist flexion.  Pt able to return demo with vc and tactile cues.  Pt. continues to benefit from OT services to work on improving LUE functioning to increase engagement of the LUE during daily tasks, and to provide education about compensatory strategies during ADLs/IADLs.   PERFORMANCE DEFICITS: in functional skills including ADLs, IADLs, coordination, dexterity, proprioception, sensation, edema, tone, ROM, strength, pain, Fine motor control, Gross motor control, mobility, balance, endurance, vision, and UE functional use, cognitive skills including perception, and psychosocial skills including coping strategies, environmental adaptation, and routines and behaviors.   IMPAIRMENTS: are limiting patient from ADLs, IADLs, and leisure.   CO-MORBIDITIES: may have co-morbidities  that affects occupational performance. Patient will benefit from skilled OT to address above impairments and improve overall function.  MODIFICATION OR ASSISTANCE TO COMPLETE EVALUATION: Min-Moderate modification of tasks or assist with assess necessary to complete an evaluation.  OT OCCUPATIONAL PROFILE AND HISTORY: Detailed assessment: Review of records and additional review of physical, cognitive, psychosocial history related to current functional performance.  CLINICAL DECISION MAKING: Moderate - several treatment options, min-mod task modification necessary  REHAB POTENTIAL: Good  EVALUATION COMPLEXITY: Moderate  PLAN:  OT FREQUENCY: 2x/week  OT DURATION: 12 weeks  PLANNED INTERVENTIONS: 97168 OT Re-evaluation, 97535 self care/ADL training, 02889 therapeutic  exercise, 97530 therapeutic activity, 97112 neuromuscular re-education, 97140 manual therapy, 97018 paraffin, 02989 moist heat, 97010 cryotherapy, 97034 contrast bath, 97760 Orthotic Initial, 97763 Orthotic/Prosthetic subsequent, passive range of motion, visual/perceptual remediation/compensation, energy conservation, and DME and/or AE instructions  RECOMMENDED OTHER SERVICES: PT and ST  CONSULTED AND AGREED WITH PLAN OF CARE: Patient and family member/caregiver  PLAN FOR NEXT SESSION: see above  Inocente Blazing, MS, OTR/L   04/08/2024, 8:16 PM   "

## 2024-04-09 ENCOUNTER — Ambulatory Visit

## 2024-04-09 DIAGNOSIS — I69354 Hemiplegia and hemiparesis following cerebral infarction affecting left non-dominant side: Secondary | ICD-10-CM

## 2024-04-09 DIAGNOSIS — R278 Other lack of coordination: Secondary | ICD-10-CM

## 2024-04-09 DIAGNOSIS — M6281 Muscle weakness (generalized): Secondary | ICD-10-CM

## 2024-04-10 ENCOUNTER — Encounter: Payer: Self-pay | Admitting: Physical Medicine & Rehabilitation

## 2024-04-10 ENCOUNTER — Encounter: Attending: Registered Nurse | Admitting: Physical Medicine & Rehabilitation

## 2024-04-10 VITALS — BP 122/76 | HR 73 | Ht 68.0 in

## 2024-04-10 DIAGNOSIS — I69354 Hemiplegia and hemiparesis following cerebral infarction affecting left non-dominant side: Secondary | ICD-10-CM | POA: Insufficient documentation

## 2024-04-10 NOTE — Patient Instructions (Signed)
" °  VISIT SUMMARY: During your visit, we discussed your ongoing mobility limitations and challenges with daily activities following your stroke and myasthenia gravis. We reviewed your current use of a loaner power wheelchair and the difficulties in obtaining insurance approval for a personal power wheelchair. We also talked about your recent completion of occupational therapy and the need for further therapy services.  YOUR PLAN: LEFT HEMIPARESIS AND SPASTICITY DUE TO RIGHT CEREBRAL INFARCTION: You have chronic left-sided weakness and spasticity, which makes it difficult to open your left hand and perform daily activities. Your condition is complicated by myasthenia gravis, which limits treatment options. -We have ordered physical and occupational therapy to help with rehabilitation and functional improvement. -A physical therapy evaluation has been ordered to update your wheelchair assessment for mobility needs and insurance purposes. -Botulinum toxin is not an option due to your myasthenia gravis. -Muscle relaxant management will be deferred to your neurology team. -We discussed Vivistim therapy, and we are waiting for insurance approval.  GAIT ABNORMALITY AND INCREASED FALL RISK AFTER STROKE: You have a chronic gait abnormality and increased risk of falling due to left-sided weakness from your stroke. You use a walker and need family assistance for safety. -We have ordered physical therapy for gait training and fall prevention. -A physical therapy evaluation has been ordered to update your wheelchair assessment for mobility needs and insurance approval.  SENSORY DISTURBANCE OF LEFT SIDE AFTER STROKE: You have persistent sensory disturbance on your left side, which affects your rehabilitation and the use of assistive devices.     Contains text generated by [employer].   "

## 2024-04-10 NOTE — Progress Notes (Signed)
 "  Subjective:    Patient ID: Douglas LITTIE [name]., male    DOB: 05-14-1950, 74 y.o.   MRN: [id number]  HPI  Discussed the use of AI scribe software for clinical note transcription with the patient, who gave verbal consent to proceed.  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  He ambulates at home with a walker but requires caregiver assistance for safety and does not ambulate independently when alone. He has ongoing difficulty with activities of daily living, requiring assistance with toileting and donning pants, though he is able to put on a shirt independently.  He currently uses a psychologist, educational wheelchair, but insurance has repeatedly denied approval for a personal power wheelchair since his hospital discharge in May 2025. Initial denials were based on not meeting criteria, and subsequent denials were related to requested features such as tilt, which were recommended by wound care for prior leg ulcers. These wounds have resolved, and he is not experiencing ongoing skin issues. He expresses concern that the loaner chair may be reclaimed at any time.  He completed occupational therapy three days prior to this visit, with therapy discontinued due to insurance denial of further sessions. He previously participated in physical and occupational therapy, with documented progress including walking 38 steps with supervision. He perceives slowed progress since the departure of his primary therapist but believes he could continue to make incremental gains with ongoing therapy. He is currently not receiving therapy services due to insurance limitations and seeks assistance in obtaining further therapy authorization.  He has been evaluated and approved for Vivistim therapy, with recent surgical evaluation and  occupational therapy assessment completed. He has difficulty opening his left hand, with variable finger extension and some preserved thumb movement. He is unable to receive botulinum toxin injections for spasticity due to myasthenia gravis. He previously trialed low-dose methocarbamol, but is not currently taking muscle relaxants due to contraindications with myasthenia gravis.    Pain Inventory Average Pain 0 Pain Right Now 0 My pain is no pain  In the last 24 hours, has pain interfered with the following? General activity 0 Relation with others 0 Enjoyment of life 0 What TIME of day is your pain at its worst? no pain Sleep (in general) Good  Pain is worse with: no pain Pain improves with: no pain Relief from Meds: no pain  Family History  Problem Relation Age of Onset   Clotting disorder Father    Heart attack Father    Social History   Socioeconomic History   Marital status: Married    Spouse name: Not on file   Number of children: Not on file   Years of education: Not on file   Highest education level: Not on file  Occupational History   Not on file  Tobacco Use   Smoking status: Never   Smokeless tobacco: Never  Substance and Sexual Activity   Alcohol use: No   Drug use: No   Sexual activity: Not on file  Other Topics Concern   Not on file  Social History Narrative   Not on file   Social Drivers of Health   Tobacco Use: Low Risk (04/10/2024)   Patient History    Smoking Tobacco Use: Never    Smokeless Tobacco Use: Never    Passive Exposure: Not on file  Financial Resource Strain: Low  Risk  (07/15/2023)   Received from [facility]   Overall Financial Resource Strain (CARDIA)    Difficulty of Paying Living Expenses: Not hard at all  Food Insecurity: No Food Insecurity (07/15/2023)   Received from [facility]   Epic    Within the past 12 months, you worried that your food would run out before you got the money to buy  more.: Never true    Within the past 12 months, the food you bought just didn't last and you didn't have money to get more.: Never true  Transportation Needs: No Transportation Needs (07/15/2023)   Received from [facility] - Transportation    In the past 12 months, has lack of transportation kept you from medical appointments or from getting medications?: No    Lack of Transportation (Non-Medical): No  Physical Activity: Not on file  Stress: Not on file  Social Connections: Not on file  Depression (PHQ2-9): Low Risk (04/10/2024)   Depression (PHQ2-9)    PHQ-2 Score: 0  Alcohol Screen: Not on file  Housing: Low Risk  (10/01/2023)   Received from [facility]   Epic    At any time in the past 12 months, were you homeless or living in a shelter (including now)?: No    In the past 12 months, how many times have you moved where you were living?: 0    In the last 12 months, was there a time when you were not able to pay the mortgage or rent on time?: No  Utilities: Not At Risk (07/15/2023)   Received from [facility] Utilities    Threatened with loss of utilities: No  Health Literacy: Not on file   Past Surgical History:  Procedure Laterality Date   BACK SURGERY  09/2015   CARDIAC CATHETERIZATION     CATARACT EXTRACTION W/ INTRAOCULAR LENS IMPLANT     CATARACT EXTRACTION W/PHACO Right 12/21/2015   Procedure: CATARACT EXTRACTION PHACO AND INTRAOCULAR LENS PLACEMENT (IOC);  Surgeon: Dene Etienne, MD;  Location: [facility];  Service: Ophthalmology;  Laterality: Right;  DIABETIC - insulin  and oral meds   COLONOSCOPY     SHOULDER SURGERY     labrum repair   TONSILLECTOMY     Past Surgical History:  Procedure Laterality Date   BACK SURGERY  09/2015   CARDIAC CATHETERIZATION     CATARACT EXTRACTION W/ INTRAOCULAR LENS IMPLANT     CATARACT EXTRACTION W/PHACO Right 12/21/2015   Procedure: CATARACT EXTRACTION  PHACO AND INTRAOCULAR LENS PLACEMENT (IOC);  Surgeon: Dene Etienne, MD;  Location: [facility];  Service: Ophthalmology;  Laterality: Right;  DIABETIC - insulin  and oral meds   COLONOSCOPY     SHOULDER SURGERY     labrum repair   TONSILLECTOMY     Past Medical History:  Diagnosis Date   Anemia    Cervical myelopathy (HCC)    Chronic venous insufficiency    [name] (chronic kidney disease), stage III (HCC)    Diabetes mellitus without complication (HCC)    GERD (gastroesophageal reflux disease)    Hypercholesteremia    Hypothyroidism    Kidney stones    about 50 in the past--last in March 4/24   Leg weakness, bilateral    due to Myasthenia Gravis   Myasthenia gravis (HCC)    Spinal stenosis    Vitamin B 12 deficiency    Vitamin B 12 deficiency  Wears hearing aid    bilateral   BP 122/76   Pulse 73   Ht 5' 8 (1.727 m)   SpO2 98%   BMI 40.29 kg/m   Opioid Risk Score:   Fall Risk Score:  `1  Depression screen [facility] 2/9     04/10/2024   12:30 PM 01/10/2024   12:14 PM 10/02/2023    9:52 AM 09/02/2023    1:43 PM  Depression screen PHQ 2/9  Decreased Interest 0 0 0 0  Down, Depressed, Hopeless 0 0 0 0  PHQ - 2 Score 0 0 0 0  Altered sleeping    0  Tired, decreased energy    0  Change in appetite    0  Feeling bad or failure about yourself     2  Trouble concentrating    0  Moving slowly or fidgety/restless    0  Suicidal thoughts    0  PHQ-9 Score    2      Data saved with a previous flowsheet row definition    Review of Systems  All other systems reviewed and are negative.      Objective:   Physical Exam General No acute distress Mood and affect appropriate Speech without dysarthria or aphasia Motor strength is 5/5 in right deltoid bicep tricep grip as well as left hip flexor knee extensor ankle dorsiflexor Sensation is absent in the left upper limb Tone is increased in the finger flexors on the left hand he is able to grasp but not  release. Left sided tone MAS 2 at the left elbow flexors MAS 3 at the finger and wrist flexors MAS 1 at the knee flexors Left lower limb 3 - hip flexor 4 - knee extensor 0 ankle dorsiflexor  Right sided tone is normal No pain with left shoulder elbow wrist or finger range of motion.     Assessment & Plan:   Assessment and Plan Assessment & Plan Left hemiparesis and spasticity due to right cerebral infarction Chronic left hemiparesis and spasticity with persistent weakness and limited hand opening. Spasticity management complicated by myasthenia gravis, precluding botulinum toxin and most muscle relaxants. Completed occupational therapy, requires assistance with daily activities. Vivistim therapy under evaluation, insurance approval pending. - Ordered physical therapy and occupational therapy for rehabilitation and functional improvement. - Ordered physical therapy evaluation for updated wheelchair assessment for mobility needs and insurance. - Botulinum toxin contraindicated due to myasthenia gravis. - Deferred muscle relaxant management to neurology due to myasthenia gravis. - Discussed Vivistim therapy; awaiting insurance approval.  Gait abnormality and increased fall risk after stroke Chronic gait abnormality and increased fall risk due to left hemiparesis post-stroke. Ambulates with walker, requires family assistance for safety. Multiple falls reported. Therapy interruptions due to insurance limitations. - Ordered physical therapy for gait training and fall prevention. - Ordered updated wheelchair evaluation by physical therapy for mobility needs and insurance approval. If the patient requires a power chair due to absent function in the left upper extremity.  He needs it for movement within the home He has additional diagnosis of myasthenia gravis secondary to fatigue with overuse of muscles and this affects his right side as well. Sensory disturbance of left side after  stroke Persistent sensory disturbance post-right cerebral infarction, contributing to functional impairment and complicating rehabilitation.    "

## 2024-04-12 NOTE — Therapy (Signed)
 " Outpatient Occupational Therapy Neuro Treatment Note  Patient Name: [name]. MRN: [id number] DOB:[birth year], 73 y.o., male Today's Date: 04/12/2024  PCP: Alm Rower, MD REFERRING PROVIDER: Hermann Toribio PARAS, PA-C  END OF SESSION:  OT End of Session - 04/12/24 1645     Visit Number 62    Number of Visits 72    Date for Recertification  06/17/23    Authorization Type 3 OT vst from 01/06-02/03/26    Authorization - Visit Number 3    Authorization - Number of Visits 3    Progress Note Due on Visit 80    OT Start Time 1400    OT Stop Time 1445    OT Time Calculation (min) 45 min    Activity Tolerance Patient tolerated treatment well    Behavior During Therapy WFL for tasks assessed/performed         Past Medical History:  Diagnosis Date   Anemia    Cervical myelopathy (HCC)    Chronic venous insufficiency    [name] (chronic kidney disease), stage III (HCC)    Diabetes mellitus without complication (HCC)    GERD (gastroesophageal reflux disease)    Hypercholesteremia    Hypothyroidism    Kidney stones    about 50 in the past--last in March 4/24   Leg weakness, bilateral    due to Myasthenia Gravis   Myasthenia gravis (HCC)    Spinal stenosis    Vitamin B 12 deficiency    Vitamin B 12 deficiency    Wears hearing aid    bilateral   Past Surgical History:  Procedure Laterality Date   BACK SURGERY  09/2015   CARDIAC CATHETERIZATION     CATARACT EXTRACTION W/ INTRAOCULAR LENS IMPLANT     CATARACT EXTRACTION W/PHACO Right 12/21/2015   Procedure: CATARACT EXTRACTION PHACO AND INTRAOCULAR LENS PLACEMENT (IOC);  Surgeon: Dene Etienne, MD;  Location: [facility];  Service: Ophthalmology;  Laterality: Right;  DIABETIC - insulin  and oral meds   COLONOSCOPY     SHOULDER SURGERY     labrum repair   TONSILLECTOMY     Patient Active Problem List   Diagnosis Date Noted   Acquired left foot drop 01/10/2024   Left spastic hemiplegia (HCC)  01/10/2024   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) 10/11/2023   High blood pressure 08/13/2023   Anemia 08/13/2023   Constipation 08/13/2023   Myasthenia gravis (HCC) 08/13/2023   Acute ischemic right MCA stroke (HCC) 07/19/2023   Lymphedema 12/27/2016   Leg pain 07/15/2016   Chronic venous insufficiency 07/15/2016   Swelling of limb 07/15/2016   Type 2 diabetes mellitus (HCC) 07/15/2016   Hyperlipidemia 07/15/2016   ONSET DATE: 07/13/2023  REFERRING DIAG: Acute ischemic R MCA CVA  THERAPY DIAG: muscle weakness (generalized), other lack of coordination, vision disturbance, hemiplegia and hemiparesis following cerebral infarction affecting left non-dominant side (HCC)  Rationale for Evaluation and Treatment: Rehabilitation  SUBJECTIVE:  SUBJECTIVE STATEMENT: Pt reports he brought his knee brace to allow option for functional mobility this visit, as requested by OT last session. Pt accompanied by: [name]  PERTINENT HISTORY: Pt. Was admitted to [facility] on 07/13/2023 after sustaining a CVA while on a trip to the beach. Pt. Was diagnosed with R MCA CVA. Pt. Was admitted to inpatient rehab from 07/19/2023-08/13/2023. Past medical history includes high BP, anemia, and Myasthenia Gravis.   PRECAUTIONS: None  WEIGHT BEARING RESTRICTIONS: No  PAIN: 04/09/24: No pain  FALLS: Has patient fallen in last 6 months? Yes  LIVING ENVIRONMENT: Lives with: lives with their family and lives with their spouse-  Lives in: House/apartment- 2 story home and resides mostly on the 1st floor Stairs: No- Ramped entrance Has following equipment at home:  Vannie, cane, Steadi, shower chair, handheld shower head, bedside commode  PLOF: Independent and Independent with basic ADLs Independent at home   PATIENT GOALS: Would like to walk 10-20 feet each time. 01/16/24: Update: pt has restarted PT visits and will be continuing to focus on mobility in those  sessions.  Pt continues to work towards improving functional use of the LUE during OT visits.   OBJECTIVE:  Note: Objective measures were completed at Evaluation unless otherwise noted.  HAND DOMINANCE: Right  ADLs: Overall ADLs:  Transfers/ambulation related to ADLs: Eating: Independent, 100% R handed Grooming: independent UB Dressing: Can put on shirt independently LB Dressing: Difficulty with pants and requires assistance getting the pants on his feet, has difficulty putting on shoes and socks Toileting: Tot A for toilet hygiene. Uses Steadi during toilet transfers. Bathing: Requires assist with using the L arm to wash the R arm. Tub Shower transfers: Roll in shower, built in shower chair, grab bars and hand held shower head.  IADLs: Shopping: Total A (Wife typically did the shopping prior to onset)   Light housekeeping: Total A (wife typically performs prior to onset) Meal Prep: Independent with light snack prep Community mobility: No driving, able to get in/out of the car Medication management: Set up assistance from his wife using a pill box (pt. Is responsible for taking medications at the correct time) Financial management: family members check behind him. Handwriting: TBD Hobbies: gardening, wood working and sports- [employer] Work history: Owns a tax business  MOBILITY STATUS: Needs Assist:    POSTURE COMMENTS:  Sitting balance: Good supported sitting balance  FUNCTIONAL OUTCOME MEASURES:  Fugl-Meyer: see below  UPPER EXTREMITY ROM:    Active ROM Right Eval  [facility] Right 09/23/2023 [facility] Left eval Left 09/23/2023 Left 10/29/23 Left 12/02/23 Left 12/31/23 Left 02/11/24 Left 03/24/24  Shoulder flexion   89(110) 109(114) 111(115) 90 before scaption (110) 95 before scaption (110) 100 before scaption (110) 110 before scaption (113)  Shoulder abduction   98(120) 109(120) 111(122) 100 (110) 105(118) 107(121) 114(121)  Shoulder adduction           Shoulder extension            Shoulder internal rotation           Shoulder external rotation           Elbow flexion   120(140) 130(136) 141(145)  146 146 146  Elbow extension   -28(-10) -5(0) -18(0)  -3(0) 0 0  Wrist flexion   60(60) Rest at 60 50(65) 54(60)    55(64)  Wrist extension   -48(42) 10(40) 20(50) 30 (45) 30(45) 45(54) 45(54)  Wrist ulnar deviation     12(22)      Wrist radial deviation     10(20)      Wrist pronation     (82)90   85 85  Wrist supination     64(74) 70 (75)  34(74) 45(74)  (Blank rows = not tested)  Eval: L Digit flexion: 2nd:3cm (0cm) 3rd: 0cm (0cm), 4th: 0cm (0cm), 5th: 2cm (0cm)  09/23/23:  L Digit flexion to [facility]: 2nd: 3.5cm (0cm), 3rd: 4 cm(0cm), 4th 3.5cm (0cm), 5th: 2cm (0cm)  Eval:  L Digit extension: Active Gross  digit extension through 10% of ROM  09/23/23:  L Digit extension: Active Gross digit extension through 50% of ROM  10/29/23:  L Digit extension: Active Gross digit extension through 25-50% of ROM  12/02/23:  L digit extension: Active gross ext through 30%   12/31/23:  L digit extension:  Active gross ext through ~30-40%   02/11/24:  Left full composite fist  L digit extension:  Active gross ext through 60% range  UPPER EXTREMITY MMT:     MMT Right eval Left eval Left 09/23/2023 Left 10/29/23 Left 12/02/23 Left 12/31/23 Left 02/11/24 Left 03/14/24  Shoulder flexion 5 3- 3-/5 3-/5  3-/5 3-/5 3-/5  Shoulder abduction 5 3- 3-/5 3-/5  3-5 3-/5 3-/5  Shoulder adduction          Shoulder extension          Shoulder internal rotation          Shoulder external rotation          Middle trapezius          Lower trapezius          Elbow flexion 5 TBD 3+/5 4-/5 4+/5 4+/5 4+/5 4+/5  Elbow extension 5 TBD 4-/5 3-/5 4+/5 4+/5 4+/5 4+/5  Wrist flexion   2/5 3-/5 3-     Wrist extension  2- 2/5 2+/5 3- 3-/5 3-/5 3-/5  Wrist ulnar deviation          Wrist radial deviation          Wrist pronation    3/5 3+  3+/5 3+/5  Wrist supination    3-/5 3-   3-/5 3-/5  (Blank rows = not tested)  HAND FUNCTION:  Eval:  Grip strength: Right: 94#, Left: 1#,  Lateral Key Pinch strength: Right: 14#, Left: 3#  10/29/23:  Grip strength: Left: 16# Lateral Key Pinch strength: Left: 5#  12/02/23: L: 5 lbs, R: 98 # Lateral pinch: Left: 5 lbs   12/31/23:  Grip strength: Right: 105#, Left: 14#,   02/11/24: Grip strength: Left: 22#  03/24/24:  Grip strength: Left: 18#  Lateral Pinch strength: Left 9#  COORDINATION: Box and Blocks: Right: 34 blocks completed in 1 min., Left: 0 blocks completed in 1 min.   10/29/23:  Box and Blocks: Right: 34 blocks completed in 1 min., Left: 2 blocks completed in 1 min.  9 Hole Peg Test: Left: To remove 9 pegs from a vertical position: 1 min. &13 sec.  12/02/23: Box and Blocks: 4 blocks 12/02/23: 4 pegs out in 3 min (multiple pulled and dropped out of the cup)   12/02/23: Box and Blocks Test: 5 blocks in 1 min.  02/10/24: Box and Blocks Test: 5 blocks in 1 min.  02/11/24: 1 min. & 18 sec. to remove 9 pegs.  Box and Blocks: 1 block in 1 min.   03/24/24: 47 sec. to remove 9 pegs. Box and Blocks: 3 blocks in 1 min.    FUGL-MEYER ASSESSMENT   A. Upper Extremity   I. Reflex Activity:   12/12/23   Flexors (biceps) 2   Extensors (triceps) 2   Total 4/4                 II. Volitional Movement within Synergies:   12/12/23   Flexor Synergy:      Shoulder Elevation 2   Shoulder Retraction 2   Shoulder Abduction (at least 90*) 2   External Rotation 1   Elbow Flexion 2   Forearm  Supination 1   Extensor Synergy:      Shoulder Adduction/Internal Rotation 2   Elbow Extension 1   Forearm Pronation 2   Total 15/18     III. Movement Combining Synergies:   12/12/23   Hand to Lumbar Spine 1   Shoulder Flexion at 90*, elbow at 0* 0   Pron/Sup w/elbow at 90* and shoulder at 0* 1   Total 2/6     IV. Movement Out of Synergy   12/12/23   Shoulder Abduction to 90*, elbow at 0*, forearm pronated 0   Shoulder  Flexion 9)-180*, elbow at 0*, forearm in mid position 0   Pron/sup, elbow at 0* and shoulder between 30-90* of flexion 0   Total 0/6     B. Wrist   12/12/23   Stability of wrist at 15* ext, elbow at 90*, shoulder 0* 2   Repeated flex/ext, elbow at 90*, shoulder 0* 1   Stability of wrist at 15* ext, elbow at 0*, shoulder 30* 0   Flex/ext, elbow 0*, shoulder 30* 0   Circumduction  1   Total 4/10     C. Hand   12/12/23   Mass Flexion 1   Mass Extension 1   Total 2/4     D. Grasp   12/12/23   Hook  0   Thumb Adduction - paper 1   Pincer - pen 0   Cylindrical - cup/can 0   Spherical - tennis ball 1   Total 2/10     E. Coordination/Speed   12/12/23   Tremor 2   Dysmetria 0   Time 1   Total 3/6     FUGL-MEYER ASSESSMENT: UPPER EXTREMITY A-E TOTAL on (date): 32/66   SENSATION: Sensation feels different in both hands.  L hand Light touch- impaired L hand Proprioception- impaired  EDEMA: Has had hx of edema, and came to Eval session with swelling in the L hand/wrist/forearm.   *pt. has a laceration from his dog on the dorsal aspect of the L hand.   MUSCLE TONE: Increased tone through the UE and hand flexors.   COGNITION: Overall cognitive status: Within functional limits for tasks assessed  VISION: Subjective report: L sided inattention Baseline vision: Wears glasses for reading only Visual history: Right visual occlusion  VISION ASSESSMENT: To be assessed  PERCEPTION: TBD  PRAXIS: Impaired                                                                                                                 TREATMENT DATE: 04/09/24 Neuro re-ed: -Facilitated WB through the L hand while seated on mat table; min-mod A to place palm flat with digits extended on mat table.  Provided stabilization at elbow and wrist during A/P and lateral weight shifts, and in prep for multiple STS transfers with L hand in WB position. -Facilitated A/P and lateral weight shifts in standing,  with OT providing intermittent stability at L knee to prevent buckling, min A for standing balance.  -Facilitated L hand  grasp/release task practice moving flat wooden shapes from table top and threading overtop vertical dowels; mod vc for motor sequencing for reach/sustained grasp/release; mod A to prevent dropping shape and directing overtop vertical dowel.  Therapeutic Activity: -Facilitated functional mobility, item transport with R hand, functional reaching with R hand, and lateral weight shifting to the L to move light items between table top surfaces within OT therapy gym.  Pt required CGA-min A for functional mobility with RW, min A when reaching contralaterally with the RUE in order to stabilize LUE/LLE, and mod vc for increasing weight bearing through the LLE and L hand into walker.    Education: Education details: Lateral weight shifts for increasing WB through the LUE/LLE during above noted activities.  Person educated: Patient Education method: Explanation, Demonstration, Tactile cues, and Verbal cues Education comprehension: verbalized understanding, returned demonstration, verbal cues required, tactile cues required, and needs further education  HOME EXERCISE PROGRAM: -grasping and stacking 1, and 1/2 cubes form the tabletop surface; yellow theraband for LUE strengthening   GOALS: Goals reviewed with patient? Yes  SHORT TERM GOALS: Target date:  05/05/2024     Pt. Will be independent with HEP for the LUE.  Baseline: 03/14/24: Independent 02/11/24: Independent 12/31/23: Independent; ongoing 12/03/23: indep with current HEP, though ongoing progressions are being made as UE function improves; 10/29/23: Pt. requires assist from wife with HEPs. Pt. Is consistently working on HEP's at home. 09/19/23: Requires assist from his wife Eval; no current HEP Goal status: Achieved,Ongoing  LONG TERM GOALS: Target date: 06/16/2024  Pt. Will increase L shoulder flexion by 10 degrees to be able  to reach up to shelves. Baseline: 03/12/24: 110 before scaption (113) 02/11/24: 100 before scaption (110) 12/31/23: Left: 95 degrees before scaption, 110 of scaption 12/03/23: 90* before onset of scaption; 10/29/23: 111(115) 09/19/23: Pt. Continues to be limited with reaching up to shelves. Eval: L shoulder flexion is 89 (110). Goal status: progressing, Ongoing  2.  Pt. Will increase L shoulder abduction by 10 degrees to be able to  reach up to shampoo hair.  Baseline: 12/230/25: 114(121) Pt. Is able to complete underarm hygiene.02/11/24: 892(878) 12/31/23: Left: 894(881) 12/03/23: 100*; pt able to demo bilat underarm hygiene with set up; 10/29/23: 888(877) 09/19/23: Pt. Continues to require assist with underarm hygiene.Eval: L shoulder abduction is 98 (120).  Goal status: Goal revised for shampooing hair (12/30- Achieved goal for underarm care )  3.  Pt. Will improve L wrist extension by 10 degrees to be able to initiate anticipation of grasping for objects.  Baseline:03/24/24: 45(54) 02/11/24: 45(54) 12/31/23: 30(45) 12/03/23: 30* (45); 10/29/23: 20(55) 09/19/23: Consistent activation noted in the left wrist extensors with facilitation. Eval: -48 (42) Goal status: Ongoing  4.  Pt. Will improve active gross digit extension through 50% of the range to be able to release objects in his hand consistently.  Baseline:03/24/24: Active gross digit extension through  65% of the range 02/11/24: L digit extension: Active gross ext through 60% range 12/31/23: Gross digit extension: ~30-40% 12/03/23: Gross digit ext through ~30% of range (limited by flexor tone in the PIPs); 10/28/20: Gross digit extension through 25-50% of the range. 09/19/23: gross digit extension noted through 25% range with facilitation.Eval: gross digit extension is 10% of the range  Goal status: Ongoing  5.  Pt. Will independently demonstrate visual compensatory strategies when navigating through environment and during tabletop tasks. Baseline: 12/03/23:  Pt demonstrates consistent head turns to scan to L visual field without cueing; 10/29/2023: Pt. Continues  to less cuing for left sided awareness. 09/19/23: Pt. requires fewer cues for left sided awareness Eval: Pt. Requires consistent cuing for L sided awareness.  Goal status: achieved   6. Pt. Will improve left hand [facility] skills to be able to independently manipulate small objects during ADLs, and IADLs. Baseline: 03/24/24: 9 Hole Peg Test: 47 sec. to remove 9 pegs. 02/11/24: 9 Hole Peg Test:1 min. & 18 sec. to remove 9 pegs.12/31/23: Pt. Is progressing to be able to grasp progressively smaller objects. 12/03/23: Pt removed 3 pegs in >3 min, pulling out more than 3 but repeatedly dropping to floor or table top rather than assessment dish; 10/29/23: 9 Hole Peg Test: Pt. Was able to remove 9 pegs from vertical position on the pegboard in 1 min. & 13 sec. Pt. Is not yet able to pick up pegs from a horizontal position to place them in the pegboard.   Goal status: ongoing   7. Pt will improve left hand function skills as evidenced by improved score to 4 blocks on the Box and Blocks test. Baseline: 03/24/24: Box and Blocks: 3 blocks in 1 min.  02/11/24 Box and Blocks: 1 block in 1 min. 12/31/23: 4 blocks in 1 min. 12/03/23: 4 blocks after 3 trials and passive wrist and digit stretching between trials (not yet consistent to achieve 4 blocks); 10/29/23: Box and Blocks Test: 2 Blocks completed in 1 min.   Goal Status: ongoing    8. Pt. Will independently be able to efficiently place his left hand on, and hold the walker handle.   Baseline: 03/24/24: Pt. Is unable to efficiently place his left hand on, and hold the walker handle.   Goal status: New  ASSESSMENT:  CLINICAL IMPRESSION:  Continued focus on increasing WB through the LUE and LLE with seated and standing therapeutic and neuro re-ed activities noted above.  Pt required less assist for placing L hand in WB on mat table while seated, and less assist for knee  stability while facilitating L lateral WS through the LLE d/t knee brace donned today.  Pt does require tactile cues at the hips and intermittent min A to initiate sufficient weight shift to the LLE while reaching.  Pt required mod A to secure flat wooden shapes within L hand in order to thread shapes over vertical dowels.  Dowels positioned forward and laterally L of pt's midline in order to promote WB through the LLE.  Pt was able to carry 1 light item (flat wooden shape) in R hand while grasping walker, in order to move shape between table top surfaces in therapy gym using RW and CGA-min A from OT for stability.  Pt requires min A for stability during contralateral reaching in standing when placing shape onto table top.  1 LOB noted during contralateral reaching, but recovered with min A.  Walker splint needed for prolonged functional mobility during item transport task to prevent loss of grip on walker handle.  Pt was able to perform a step pivot transfer with RW and no walker splint at end of session without losing L grip with vc only.  Pt continues to make steady progress with dynamic sitting and standing tasks, but constant physical assist is needed as LUE/LLE require support to maintain stability in the L elbow, L wrist, and L knee when weight shifting and weightbearing through these extremities in order to reduce fall risk or injury.  Pt continues to require skilled in-clinic OT to provide the necessary supervised task challenges/modifications/progressions of  skillfully selected neuromuscular re-education activities in order to maximize neuroplastic changes that continue to occur within the first year to 1.5+ years post CVA.  Although still very limited, pt continues to show progress towards above noted goals that are specifically working towards improving pt's ability to engage the LUE in meaningful and more efficient ways during ADL/IADL and work related tasks, while further reducing burden of care on  spouse, and improving pt's QOL.  PERFORMANCE DEFICITS: in functional skills including ADLs, IADLs, coordination, dexterity, proprioception, sensation, edema, tone, ROM, strength, pain, Fine motor control, Gross motor control, mobility, balance, endurance, vision, and UE functional use, cognitive skills including perception, and psychosocial skills including coping strategies, environmental adaptation, and routines and behaviors.   IMPAIRMENTS: are limiting patient from ADLs, IADLs, and leisure.   CO-MORBIDITIES: may have co-morbidities  that affects occupational performance. Patient will benefit from skilled OT to address above impairments and improve overall function.  MODIFICATION OR ASSISTANCE TO COMPLETE EVALUATION: Min-Moderate modification of tasks or assist with assess necessary to complete an evaluation.  OT OCCUPATIONAL PROFILE AND HISTORY: Detailed assessment: Review of records and additional review of physical, cognitive, psychosocial history related to current functional performance.  CLINICAL DECISION MAKING: Moderate - several treatment options, min-mod task modification necessary  REHAB POTENTIAL: Good  EVALUATION COMPLEXITY: Moderate  PLAN:  OT FREQUENCY: 2x/week  OT DURATION: 12 weeks  PLANNED INTERVENTIONS: 97168 OT Re-evaluation, 97535 self care/ADL training, 02889 therapeutic exercise, 97530 therapeutic activity, 97112 neuromuscular re-education, 97140 manual therapy, 97018 paraffin, 02989 moist heat, 97010 cryotherapy, 97034 contrast bath, 97760 Orthotic Initial, 97763 Orthotic/Prosthetic subsequent, passive range of motion, visual/perceptual remediation/compensation, energy conservation, and DME and/or AE instructions  RECOMMENDED OTHER SERVICES: PT and ST  CONSULTED AND AGREED WITH PLAN OF CARE: Patient and family member/caregiver  PLAN FOR NEXT SESSION: see above  Inocente Blazing, MS, OTR/L   04/12/24, 4:47 PM   "

## 2024-04-14 ENCOUNTER — Ambulatory Visit

## 2024-04-16 ENCOUNTER — Ambulatory Visit

## 2024-04-21 ENCOUNTER — Ambulatory Visit

## 2024-04-21 ENCOUNTER — Encounter: Admitting: Physician Assistant

## 2024-04-22 ENCOUNTER — Encounter: Admitting: Physician Assistant

## 2024-04-23 ENCOUNTER — Ambulatory Visit

## 2024-04-27 ENCOUNTER — Encounter: Admitting: Physician Assistant

## 2024-04-28 ENCOUNTER — Ambulatory Visit

## 2024-04-30 ENCOUNTER — Ambulatory Visit

## 2024-05-04 ENCOUNTER — Encounter: Admitting: Physician Assistant

## 2024-05-05 ENCOUNTER — Ambulatory Visit

## 2024-05-06 ENCOUNTER — Ambulatory Visit

## 2024-05-07 ENCOUNTER — Ambulatory Visit

## 2024-05-08 ENCOUNTER — Ambulatory Visit

## 2024-05-12 ENCOUNTER — Ambulatory Visit

## 2024-05-14 ENCOUNTER — Ambulatory Visit

## 2024-05-19 ENCOUNTER — Ambulatory Visit

## 2024-05-19 ENCOUNTER — Ambulatory Visit: Admitting: Physical Therapy

## 2024-05-21 ENCOUNTER — Ambulatory Visit

## 2024-05-26 ENCOUNTER — Ambulatory Visit

## 2024-05-26 ENCOUNTER — Ambulatory Visit: Admitting: Physical Therapy

## 2024-05-28 ENCOUNTER — Ambulatory Visit: Admitting: Occupational Therapy

## 2024-05-28 ENCOUNTER — Ambulatory Visit: Admitting: Physical Therapy

## 2024-06-02 ENCOUNTER — Ambulatory Visit

## 2024-06-02 ENCOUNTER — Ambulatory Visit: Admitting: Occupational Therapy

## 2024-06-03 ENCOUNTER — Ambulatory Visit: Admitting: Occupational Therapy

## 2024-06-03 ENCOUNTER — Ambulatory Visit: Admitting: Physical Therapy

## 2024-06-04 ENCOUNTER — Ambulatory Visit

## 2024-06-05 ENCOUNTER — Ambulatory Visit

## 2024-06-09 ENCOUNTER — Ambulatory Visit: Admitting: Physical Therapy

## 2024-06-09 ENCOUNTER — Ambulatory Visit

## 2024-06-11 ENCOUNTER — Ambulatory Visit

## 2024-06-16 ENCOUNTER — Ambulatory Visit

## 2024-06-16 ENCOUNTER — Ambulatory Visit: Admitting: Physical Therapy

## 2024-06-18 ENCOUNTER — Ambulatory Visit: Admitting: Occupational Therapy

## 2024-06-18 ENCOUNTER — Ambulatory Visit

## 2024-06-23 ENCOUNTER — Ambulatory Visit: Admitting: Occupational Therapy

## 2024-06-23 ENCOUNTER — Ambulatory Visit: Admitting: Physical Therapy

## 2024-06-25 ENCOUNTER — Ambulatory Visit

## 2024-06-29 ENCOUNTER — Ambulatory Visit

## 2024-06-29 ENCOUNTER — Ambulatory Visit: Admitting: Occupational Therapy

## 2024-06-30 ENCOUNTER — Ambulatory Visit

## 2024-07-01 ENCOUNTER — Ambulatory Visit

## 2024-07-02 ENCOUNTER — Ambulatory Visit: Admitting: Occupational Therapy

## 2024-07-02 ENCOUNTER — Ambulatory Visit

## 2024-07-06 ENCOUNTER — Ambulatory Visit

## 2024-07-06 ENCOUNTER — Ambulatory Visit: Admitting: Physical Therapy

## 2024-07-07 ENCOUNTER — Ambulatory Visit

## 2024-07-08 ENCOUNTER — Ambulatory Visit

## 2024-07-09 ENCOUNTER — Ambulatory Visit

## 2024-07-09 ENCOUNTER — Ambulatory Visit: Admitting: Occupational Therapy

## 2024-07-13 ENCOUNTER — Ambulatory Visit

## 2024-07-14 ENCOUNTER — Ambulatory Visit

## 2024-07-15 ENCOUNTER — Ambulatory Visit

## 2024-07-16 ENCOUNTER — Ambulatory Visit: Admitting: Occupational Therapy

## 2024-07-16 ENCOUNTER — Ambulatory Visit

## 2024-07-20 ENCOUNTER — Ambulatory Visit

## 2024-07-21 ENCOUNTER — Ambulatory Visit

## 2024-07-22 ENCOUNTER — Ambulatory Visit

## 2024-07-23 ENCOUNTER — Ambulatory Visit: Admitting: Occupational Therapy

## 2024-07-23 ENCOUNTER — Ambulatory Visit

## 2024-07-27 ENCOUNTER — Ambulatory Visit

## 2024-07-28 ENCOUNTER — Ambulatory Visit

## 2024-07-29 ENCOUNTER — Ambulatory Visit

## 2024-07-30 ENCOUNTER — Ambulatory Visit: Admitting: Occupational Therapy

## 2024-07-30 ENCOUNTER — Ambulatory Visit

## 2024-08-03 ENCOUNTER — Ambulatory Visit

## 2024-08-04 ENCOUNTER — Ambulatory Visit

## 2024-08-05 ENCOUNTER — Ambulatory Visit

## 2024-08-06 ENCOUNTER — Ambulatory Visit

## 2024-08-06 ENCOUNTER — Ambulatory Visit: Admitting: Occupational Therapy

## 2024-08-10 ENCOUNTER — Ambulatory Visit

## 2024-08-10 ENCOUNTER — Ambulatory Visit: Admitting: Occupational Therapy

## 2024-08-11 ENCOUNTER — Ambulatory Visit

## 2024-08-12 ENCOUNTER — Ambulatory Visit

## 2024-08-13 ENCOUNTER — Ambulatory Visit

## 2024-08-13 ENCOUNTER — Ambulatory Visit: Admitting: Occupational Therapy

## 2024-08-18 ENCOUNTER — Ambulatory Visit

## 2024-08-20 ENCOUNTER — Ambulatory Visit: Admitting: Occupational Therapy

## 2024-08-20 ENCOUNTER — Ambulatory Visit

## 2024-08-25 ENCOUNTER — Ambulatory Visit

## 2024-08-27 ENCOUNTER — Ambulatory Visit

## 2024-08-27 ENCOUNTER — Ambulatory Visit: Admitting: Occupational Therapy

## 2024-09-01 ENCOUNTER — Ambulatory Visit

## 2024-09-03 ENCOUNTER — Ambulatory Visit: Admitting: Occupational Therapy

## 2024-09-03 ENCOUNTER — Ambulatory Visit

## 2024-09-08 ENCOUNTER — Ambulatory Visit

## 2024-09-10 ENCOUNTER — Ambulatory Visit

## 2024-09-10 ENCOUNTER — Ambulatory Visit: Admitting: Occupational Therapy

## 2024-09-15 ENCOUNTER — Ambulatory Visit

## 2024-09-17 ENCOUNTER — Ambulatory Visit

## 2024-09-17 ENCOUNTER — Ambulatory Visit: Admitting: Occupational Therapy

## 2024-10-08 ENCOUNTER — Encounter: Admitting: Physical Medicine & Rehabilitation
